# Patient Record
Sex: Female | Born: 1955 | Race: White | Hispanic: No | Smoking: Former smoker
Health system: Southern US, Community
[De-identification: ages and names within clinical notes are randomized; demographics above are authoritative.]

## PROBLEM LIST (undated history)

## (undated) DIAGNOSIS — Z5111 Encounter for antineoplastic chemotherapy: Secondary | ICD-10-CM

## (undated) DIAGNOSIS — R112 Nausea with vomiting, unspecified: Secondary | ICD-10-CM

## (undated) DIAGNOSIS — Z9889 Other specified postprocedural states: Secondary | ICD-10-CM

## (undated) DIAGNOSIS — M1990 Unspecified osteoarthritis, unspecified site: Secondary | ICD-10-CM

## (undated) DIAGNOSIS — K589 Irritable bowel syndrome without diarrhea: Secondary | ICD-10-CM

## (undated) DIAGNOSIS — F419 Anxiety disorder, unspecified: Secondary | ICD-10-CM

## (undated) DIAGNOSIS — R0600 Dyspnea, unspecified: Secondary | ICD-10-CM

## (undated) DIAGNOSIS — C801 Malignant (primary) neoplasm, unspecified: Secondary | ICD-10-CM

## (undated) DIAGNOSIS — J449 Chronic obstructive pulmonary disease, unspecified: Secondary | ICD-10-CM

## (undated) DIAGNOSIS — D131 Benign neoplasm of stomach: Secondary | ICD-10-CM

## (undated) DIAGNOSIS — T451X5A Adverse effect of antineoplastic and immunosuppressive drugs, initial encounter: Secondary | ICD-10-CM

## (undated) DIAGNOSIS — K635 Polyp of colon: Secondary | ICD-10-CM

## (undated) DIAGNOSIS — I10 Essential (primary) hypertension: Secondary | ICD-10-CM

## (undated) DIAGNOSIS — D6481 Anemia due to antineoplastic chemotherapy: Secondary | ICD-10-CM

## (undated) DIAGNOSIS — M797 Fibromyalgia: Secondary | ICD-10-CM

## (undated) DIAGNOSIS — E039 Hypothyroidism, unspecified: Secondary | ICD-10-CM

## (undated) DIAGNOSIS — K219 Gastro-esophageal reflux disease without esophagitis: Secondary | ICD-10-CM

## (undated) DIAGNOSIS — E860 Dehydration: Secondary | ICD-10-CM

## (undated) DIAGNOSIS — K5792 Diverticulitis of intestine, part unspecified, without perforation or abscess without bleeding: Secondary | ICD-10-CM

## (undated) HISTORY — PX: FOOT SURGERY: SHX648

## (undated) HISTORY — PX: TONSILLECTOMY: SUR1361

## (undated) HISTORY — DX: Diverticulitis of intestine, part unspecified, without perforation or abscess without bleeding: K57.92

## (undated) HISTORY — DX: Malignant (primary) neoplasm, unspecified: C80.1

## (undated) HISTORY — PX: UPPER GASTROINTESTINAL ENDOSCOPY: SHX188

## (undated) HISTORY — PX: BLADDER SUSPENSION: SHX72

## (undated) HISTORY — PX: BREAST IMPLANT REMOVAL: SHX5361

## (undated) HISTORY — DX: Polyp of colon: K63.5

## (undated) HISTORY — PX: COLONOSCOPY: SHX174

## (undated) HISTORY — DX: Anemia due to antineoplastic chemotherapy: D64.81

## (undated) HISTORY — DX: Benign neoplasm of stomach: D13.1

## (undated) HISTORY — DX: Dehydration: E86.0

## (undated) HISTORY — DX: Adverse effect of antineoplastic and immunosuppressive drugs, initial encounter: T45.1X5A

## (undated) HISTORY — PX: BREAST ENHANCEMENT SURGERY: SHX7

## (undated) HISTORY — DX: Encounter for antineoplastic chemotherapy: Z51.11

---

## 1997-12-02 HISTORY — PX: CHOLECYSTECTOMY: SHX55

## 2000-12-10 ENCOUNTER — Encounter: Admission: RE | Admit: 2000-12-10 | Discharge: 2000-12-10 | Payer: Self-pay | Admitting: Family Medicine

## 2000-12-10 ENCOUNTER — Encounter: Payer: Self-pay | Admitting: Family Medicine

## 2001-09-24 ENCOUNTER — Emergency Department (HOSPITAL_COMMUNITY): Admission: EM | Admit: 2001-09-24 | Discharge: 2001-09-24 | Payer: Self-pay | Admitting: Internal Medicine

## 2001-09-28 ENCOUNTER — Emergency Department (HOSPITAL_COMMUNITY): Admission: EM | Admit: 2001-09-28 | Discharge: 2001-09-28 | Payer: Self-pay | Admitting: Emergency Medicine

## 2001-10-01 ENCOUNTER — Emergency Department (HOSPITAL_COMMUNITY): Admission: EM | Admit: 2001-10-01 | Discharge: 2001-10-01 | Payer: Self-pay | Admitting: Emergency Medicine

## 2001-10-08 ENCOUNTER — Emergency Department (HOSPITAL_COMMUNITY): Admission: EM | Admit: 2001-10-08 | Discharge: 2001-10-08 | Payer: Self-pay | Admitting: Emergency Medicine

## 2001-10-22 ENCOUNTER — Emergency Department (HOSPITAL_COMMUNITY): Admission: EM | Admit: 2001-10-22 | Discharge: 2001-10-22 | Payer: Self-pay | Admitting: *Deleted

## 2001-12-15 ENCOUNTER — Emergency Department (HOSPITAL_COMMUNITY): Admission: EM | Admit: 2001-12-15 | Discharge: 2001-12-15 | Payer: Self-pay | Admitting: *Deleted

## 2001-12-15 ENCOUNTER — Encounter: Payer: Self-pay | Admitting: *Deleted

## 2002-01-04 ENCOUNTER — Other Ambulatory Visit: Admission: RE | Admit: 2002-01-04 | Discharge: 2002-01-04 | Payer: Self-pay | Admitting: Dermatology

## 2002-06-10 ENCOUNTER — Ambulatory Visit (HOSPITAL_COMMUNITY): Admission: RE | Admit: 2002-06-10 | Discharge: 2002-06-10 | Payer: Self-pay | Admitting: Unknown Physician Specialty

## 2002-06-10 ENCOUNTER — Encounter: Payer: Self-pay | Admitting: Unknown Physician Specialty

## 2002-08-04 ENCOUNTER — Ambulatory Visit (HOSPITAL_COMMUNITY): Admission: RE | Admit: 2002-08-04 | Discharge: 2002-08-04 | Payer: Self-pay | Admitting: Family Medicine

## 2002-08-04 ENCOUNTER — Encounter: Payer: Self-pay | Admitting: Family Medicine

## 2002-08-12 ENCOUNTER — Inpatient Hospital Stay (HOSPITAL_COMMUNITY): Admission: EM | Admit: 2002-08-12 | Discharge: 2002-08-14 | Payer: Self-pay | Admitting: Emergency Medicine

## 2002-08-12 ENCOUNTER — Encounter: Payer: Self-pay | Admitting: Emergency Medicine

## 2002-08-13 ENCOUNTER — Encounter: Payer: Self-pay | Admitting: *Deleted

## 2002-11-15 ENCOUNTER — Other Ambulatory Visit: Admission: RE | Admit: 2002-11-15 | Discharge: 2002-11-15 | Payer: Self-pay | Admitting: Dermatology

## 2003-06-03 ENCOUNTER — Ambulatory Visit (HOSPITAL_COMMUNITY): Admission: RE | Admit: 2003-06-03 | Discharge: 2003-06-03 | Payer: Self-pay | Admitting: Family Medicine

## 2003-06-03 ENCOUNTER — Encounter: Payer: Self-pay | Admitting: Family Medicine

## 2003-06-06 ENCOUNTER — Encounter: Payer: Self-pay | Admitting: *Deleted

## 2003-06-06 ENCOUNTER — Inpatient Hospital Stay (HOSPITAL_COMMUNITY): Admission: EM | Admit: 2003-06-06 | Discharge: 2003-06-09 | Payer: Self-pay | Admitting: *Deleted

## 2003-06-22 ENCOUNTER — Ambulatory Visit (HOSPITAL_COMMUNITY): Admission: RE | Admit: 2003-06-22 | Discharge: 2003-06-22 | Payer: Self-pay | Admitting: Family Medicine

## 2003-06-22 ENCOUNTER — Encounter: Payer: Self-pay | Admitting: Family Medicine

## 2003-07-04 ENCOUNTER — Ambulatory Visit (HOSPITAL_COMMUNITY): Admission: RE | Admit: 2003-07-04 | Discharge: 2003-07-04 | Payer: Self-pay | Admitting: Family Medicine

## 2003-07-04 ENCOUNTER — Encounter: Payer: Self-pay | Admitting: Family Medicine

## 2003-08-12 ENCOUNTER — Ambulatory Visit (HOSPITAL_COMMUNITY): Admission: RE | Admit: 2003-08-12 | Discharge: 2003-08-12 | Payer: Self-pay | Admitting: Pulmonary Disease

## 2003-08-31 ENCOUNTER — Ambulatory Visit (HOSPITAL_COMMUNITY): Admission: RE | Admit: 2003-08-31 | Discharge: 2003-08-31 | Payer: Self-pay | Admitting: *Deleted

## 2003-08-31 ENCOUNTER — Encounter: Payer: Self-pay | Admitting: *Deleted

## 2003-12-16 ENCOUNTER — Emergency Department (HOSPITAL_COMMUNITY): Admission: EM | Admit: 2003-12-16 | Discharge: 2003-12-16 | Payer: Self-pay | Admitting: Emergency Medicine

## 2004-01-02 ENCOUNTER — Ambulatory Visit (HOSPITAL_COMMUNITY): Admission: RE | Admit: 2004-01-02 | Discharge: 2004-01-02 | Payer: Self-pay | Admitting: Family Medicine

## 2004-01-06 ENCOUNTER — Ambulatory Visit (HOSPITAL_COMMUNITY): Admission: RE | Admit: 2004-01-06 | Discharge: 2004-01-06 | Payer: Self-pay | Admitting: Family Medicine

## 2004-01-18 ENCOUNTER — Ambulatory Visit (HOSPITAL_COMMUNITY): Admission: RE | Admit: 2004-01-18 | Discharge: 2004-01-18 | Payer: Self-pay | Admitting: Family Medicine

## 2004-01-26 ENCOUNTER — Emergency Department (HOSPITAL_COMMUNITY): Admission: EM | Admit: 2004-01-26 | Discharge: 2004-01-26 | Payer: Self-pay | Admitting: Emergency Medicine

## 2004-04-03 ENCOUNTER — Inpatient Hospital Stay (HOSPITAL_COMMUNITY): Admission: EM | Admit: 2004-04-03 | Discharge: 2004-04-10 | Payer: Self-pay | Admitting: Emergency Medicine

## 2004-04-11 ENCOUNTER — Observation Stay (HOSPITAL_COMMUNITY): Admission: EM | Admit: 2004-04-11 | Discharge: 2004-04-12 | Payer: Self-pay | Admitting: Emergency Medicine

## 2004-06-11 ENCOUNTER — Ambulatory Visit (HOSPITAL_COMMUNITY): Admission: RE | Admit: 2004-06-11 | Discharge: 2004-06-11 | Payer: Self-pay | Admitting: Gastroenterology

## 2004-06-22 ENCOUNTER — Emergency Department (HOSPITAL_COMMUNITY): Admission: EM | Admit: 2004-06-22 | Discharge: 2004-06-22 | Payer: Self-pay | Admitting: Emergency Medicine

## 2004-09-12 ENCOUNTER — Emergency Department (HOSPITAL_COMMUNITY): Admission: EM | Admit: 2004-09-12 | Discharge: 2004-09-12 | Payer: Self-pay | Admitting: Emergency Medicine

## 2004-09-21 ENCOUNTER — Emergency Department (HOSPITAL_COMMUNITY): Admission: EM | Admit: 2004-09-21 | Discharge: 2004-09-22 | Payer: Self-pay | Admitting: *Deleted

## 2004-12-01 ENCOUNTER — Emergency Department (HOSPITAL_COMMUNITY): Admission: EM | Admit: 2004-12-01 | Discharge: 2004-12-01 | Payer: Self-pay | Admitting: Emergency Medicine

## 2005-02-05 ENCOUNTER — Ambulatory Visit: Payer: Self-pay | Admitting: Internal Medicine

## 2005-05-23 ENCOUNTER — Ambulatory Visit: Payer: Self-pay | Admitting: Internal Medicine

## 2005-06-03 ENCOUNTER — Ambulatory Visit (HOSPITAL_COMMUNITY): Admission: RE | Admit: 2005-06-03 | Discharge: 2005-06-03 | Payer: Self-pay | Admitting: Internal Medicine

## 2005-06-05 ENCOUNTER — Emergency Department (HOSPITAL_COMMUNITY): Admission: EM | Admit: 2005-06-05 | Discharge: 2005-06-05 | Payer: Self-pay | Admitting: Emergency Medicine

## 2005-07-03 ENCOUNTER — Ambulatory Visit: Payer: Self-pay | Admitting: Internal Medicine

## 2005-07-04 ENCOUNTER — Ambulatory Visit (HOSPITAL_COMMUNITY): Admission: RE | Admit: 2005-07-04 | Discharge: 2005-07-04 | Payer: Self-pay | Admitting: Internal Medicine

## 2005-08-28 ENCOUNTER — Ambulatory Visit (HOSPITAL_COMMUNITY): Admission: RE | Admit: 2005-08-28 | Discharge: 2005-08-28 | Payer: Self-pay | Admitting: Unknown Physician Specialty

## 2006-03-18 ENCOUNTER — Ambulatory Visit: Payer: Self-pay | Admitting: Internal Medicine

## 2006-06-02 ENCOUNTER — Ambulatory Visit (HOSPITAL_COMMUNITY): Admission: RE | Admit: 2006-06-02 | Discharge: 2006-06-02 | Payer: Self-pay | Admitting: Gastroenterology

## 2006-07-04 ENCOUNTER — Inpatient Hospital Stay (HOSPITAL_COMMUNITY): Admission: AD | Admit: 2006-07-04 | Discharge: 2006-07-06 | Payer: Self-pay | Admitting: Internal Medicine

## 2006-07-04 ENCOUNTER — Ambulatory Visit: Payer: Self-pay | Admitting: Urgent Care

## 2006-07-10 ENCOUNTER — Ambulatory Visit (HOSPITAL_COMMUNITY): Admission: RE | Admit: 2006-07-10 | Discharge: 2006-07-10 | Payer: Self-pay | Admitting: Family Medicine

## 2006-08-06 ENCOUNTER — Ambulatory Visit: Payer: Self-pay | Admitting: Internal Medicine

## 2006-09-01 ENCOUNTER — Ambulatory Visit (HOSPITAL_COMMUNITY): Admission: RE | Admit: 2006-09-01 | Discharge: 2006-09-01 | Payer: Self-pay | Admitting: Internal Medicine

## 2006-09-24 ENCOUNTER — Ambulatory Visit (HOSPITAL_COMMUNITY): Admission: RE | Admit: 2006-09-24 | Discharge: 2006-09-24 | Payer: Self-pay | Admitting: Family Medicine

## 2006-09-30 ENCOUNTER — Ambulatory Visit (HOSPITAL_COMMUNITY): Admission: RE | Admit: 2006-09-30 | Discharge: 2006-09-30 | Payer: Self-pay | Admitting: Internal Medicine

## 2007-04-01 ENCOUNTER — Ambulatory Visit (HOSPITAL_COMMUNITY): Admission: RE | Admit: 2007-04-01 | Discharge: 2007-04-01 | Payer: Self-pay | Admitting: Unknown Physician Specialty

## 2007-04-16 ENCOUNTER — Ambulatory Visit (HOSPITAL_COMMUNITY): Payer: Self-pay | Admitting: Psychology

## 2007-04-17 ENCOUNTER — Ambulatory Visit (HOSPITAL_COMMUNITY): Payer: Self-pay | Admitting: Psychiatry

## 2007-04-22 ENCOUNTER — Ambulatory Visit (HOSPITAL_COMMUNITY): Payer: Self-pay | Admitting: Psychology

## 2007-05-11 ENCOUNTER — Ambulatory Visit (HOSPITAL_COMMUNITY): Payer: Self-pay | Admitting: Psychology

## 2007-06-04 ENCOUNTER — Ambulatory Visit (HOSPITAL_COMMUNITY): Admission: RE | Admit: 2007-06-04 | Discharge: 2007-06-04 | Payer: Self-pay | Admitting: Internal Medicine

## 2007-10-13 ENCOUNTER — Encounter: Admission: RE | Admit: 2007-10-13 | Discharge: 2007-10-13 | Payer: Self-pay | Admitting: Unknown Physician Specialty

## 2007-12-08 ENCOUNTER — Inpatient Hospital Stay (HOSPITAL_COMMUNITY): Admission: EM | Admit: 2007-12-08 | Discharge: 2007-12-11 | Payer: Self-pay | Admitting: Emergency Medicine

## 2008-02-28 ENCOUNTER — Emergency Department (HOSPITAL_COMMUNITY): Admission: EM | Admit: 2008-02-28 | Discharge: 2008-02-28 | Payer: Self-pay | Admitting: Emergency Medicine

## 2008-04-13 ENCOUNTER — Ambulatory Visit (HOSPITAL_COMMUNITY): Admission: RE | Admit: 2008-04-13 | Discharge: 2008-04-13 | Payer: Self-pay | Admitting: Internal Medicine

## 2008-04-20 ENCOUNTER — Ambulatory Visit (HOSPITAL_COMMUNITY): Admission: RE | Admit: 2008-04-20 | Discharge: 2008-04-20 | Payer: Self-pay | Admitting: Family Medicine

## 2008-05-25 ENCOUNTER — Emergency Department (HOSPITAL_COMMUNITY): Admission: EM | Admit: 2008-05-25 | Discharge: 2008-05-25 | Payer: Self-pay | Admitting: Emergency Medicine

## 2008-10-17 ENCOUNTER — Ambulatory Visit (HOSPITAL_COMMUNITY): Admission: RE | Admit: 2008-10-17 | Discharge: 2008-10-17 | Payer: Self-pay | Admitting: Internal Medicine

## 2009-07-25 ENCOUNTER — Ambulatory Visit (HOSPITAL_COMMUNITY): Admission: RE | Admit: 2009-07-25 | Discharge: 2009-07-25 | Payer: Self-pay | Admitting: Internal Medicine

## 2009-08-16 ENCOUNTER — Emergency Department (HOSPITAL_COMMUNITY): Admission: EM | Admit: 2009-08-16 | Discharge: 2009-08-16 | Payer: Self-pay | Admitting: Emergency Medicine

## 2010-07-19 ENCOUNTER — Emergency Department (HOSPITAL_COMMUNITY): Admission: EM | Admit: 2010-07-19 | Discharge: 2010-07-19 | Payer: Self-pay | Admitting: Emergency Medicine

## 2010-07-19 ENCOUNTER — Inpatient Hospital Stay (HOSPITAL_COMMUNITY): Admission: EM | Admit: 2010-07-19 | Discharge: 2010-07-20 | Payer: Self-pay | Admitting: Emergency Medicine

## 2010-07-28 ENCOUNTER — Ambulatory Visit (HOSPITAL_COMMUNITY): Admission: RE | Admit: 2010-07-28 | Discharge: 2010-07-28 | Payer: Self-pay | Admitting: Family Medicine

## 2010-11-12 ENCOUNTER — Encounter (HOSPITAL_COMMUNITY): Admission: RE | Admit: 2010-11-12 | Payer: Self-pay | Source: Home / Self Care | Admitting: Orthopedic Surgery

## 2010-11-15 ENCOUNTER — Ambulatory Visit (HOSPITAL_COMMUNITY)
Admission: RE | Admit: 2010-11-15 | Discharge: 2010-11-15 | Payer: Self-pay | Source: Home / Self Care | Attending: Orthopedic Surgery | Admitting: Orthopedic Surgery

## 2010-12-23 ENCOUNTER — Encounter: Payer: Self-pay | Admitting: Obstetrics and Gynecology

## 2010-12-23 ENCOUNTER — Encounter: Payer: Self-pay | Admitting: *Deleted

## 2011-01-08 ENCOUNTER — Other Ambulatory Visit: Payer: Self-pay | Admitting: Neurosurgery

## 2011-01-08 DIAGNOSIS — M47812 Spondylosis without myelopathy or radiculopathy, cervical region: Secondary | ICD-10-CM

## 2011-01-10 ENCOUNTER — Ambulatory Visit
Admission: RE | Admit: 2011-01-10 | Discharge: 2011-01-10 | Disposition: A | Discharge status: Home / Self Care | Payer: MEDICARE | Source: Ambulatory Visit | Attending: Neurosurgery | Admitting: Neurosurgery

## 2011-01-10 DIAGNOSIS — M47812 Spondylosis without myelopathy or radiculopathy, cervical region: Secondary | ICD-10-CM

## 2011-02-14 LAB — DIFFERENTIAL
Basophils Relative: 1 % (ref 0–1)
Eosinophils Absolute: 0.3 10*3/uL (ref 0.0–0.7)
Eosinophils Relative: 4 % (ref 0–5)
Lymphs Abs: 2.4 10*3/uL (ref 0.7–4.0)
Monocytes Relative: 9 % (ref 3–12)
Neutrophils Relative %: 51 % (ref 43–77)

## 2011-02-14 LAB — BASIC METABOLIC PANEL
BUN: 13 mg/dL (ref 6–23)
Creatinine, Ser: 0.7 mg/dL (ref 0.4–1.2)
GFR calc Af Amer: >60 mL/min (ref >60)
GFR calc non Af Amer: >60 mL/min (ref >60)
Potassium: 3.6 mEq/L (ref 3.5–5.1)

## 2011-02-14 LAB — CARDIAC PANEL(CRET KIN+CKTOT+MB+TROPI)
CK, MB: 0.5 ng/mL (ref 0.3–4.0)
CK, MB: 0.6 ng/mL (ref 0.3–4.0)
Relative Index: INVALID (ref 0.0–2.5)
Relative Index: INVALID (ref 0.0–2.5)
Total CK: 68 U/L (ref 7–177)
Troponin I: 0.01 ng/mL (ref 0.00–0.06)
Troponin I: 0.02 ng/mL (ref 0.00–0.06)

## 2011-02-14 LAB — CBC
HCT: 39.2 % (ref 36.0–46.0)
MCH: 29.4 pg (ref 26.0–34.0)
MCHC: 34.4 g/dL (ref 30.0–36.0)
RBC: 4.59 MIL/uL (ref 3.87–5.11)
WBC: 7.1 10*3/uL (ref 4.0–10.5)

## 2011-02-14 LAB — POCT CARDIAC MARKERS
CKMB, poc: <1 ng/mL — ABNORMAL LOW (ref 1.0–8.0)
Myoglobin, poc: 30.5 ng/mL (ref 12–200)
Myoglobin, poc: 32.3 ng/mL (ref 12–200)
Troponin i, poc: <0.05 ng/mL (ref 0.00–0.09)

## 2011-03-08 LAB — BASIC METABOLIC PANEL
BUN: 12 mg/dL (ref 6–23)
Calcium: 9.1 mg/dL (ref 8.4–10.5)
Chloride: 102 mEq/L (ref 96–112)
Creatinine, Ser: 0.69 mg/dL (ref 0.4–1.2)
GFR calc Af Amer: >60 mL/min (ref >60)
GFR calc non Af Amer: >60 mL/min (ref >60)

## 2011-03-08 LAB — POCT CARDIAC MARKERS
CKMB, poc: <1 ng/mL — ABNORMAL LOW (ref 1.0–8.0)
Myoglobin, poc: 41.2 ng/mL (ref 12–200)
Troponin i, poc: <0.05 ng/mL (ref 0.00–0.09)

## 2011-03-08 LAB — DIFFERENTIAL
Eosinophils Absolute: 0.2 10*3/uL (ref 0.0–0.7)
Lymphs Abs: 3.1 10*3/uL (ref 0.7–4.0)
Neutro Abs: 6.5 10*3/uL (ref 1.7–7.7)
Neutrophils Relative %: 61 % (ref 43–77)

## 2011-03-08 LAB — HEPATIC FUNCTION PANEL
AST: 21 U/L (ref 0–37)
Albumin: 3.4 g/dL — ABNORMAL LOW (ref 3.5–5.2)
Alkaline Phosphatase: 67 U/L (ref 39–117)
Total Protein: 6.7 g/dL (ref 6.0–8.3)

## 2011-03-08 LAB — MAGNESIUM: Magnesium: 1.8 mg/dL (ref 1.5–2.5)

## 2011-03-08 LAB — CBC
MCV: 90.4 fL (ref 78.0–100.0)
Platelets: 261 10*3/uL (ref 150–400)
RBC: 4.76 MIL/uL (ref 3.87–5.11)
WBC: 10.6 10*3/uL — ABNORMAL HIGH (ref 4.0–10.5)

## 2011-03-08 LAB — D-DIMER, QUANTITATIVE: D-Dimer, Quant: 0.34 ug/mL-FEU (ref 0.00–0.48)

## 2011-04-16 NOTE — Discharge Summary (Signed)
NAMETERRACE, CHIEM                  ACCOUNT NO.:  [id number]   MEDICAL RECORD NO.:  [id number]          PATIENT TYPE:  INP   LOCATION:  2030                         FACILITY:  [facility]   PHYSICIAN:  Dani Gobble, MD       DATE OF BIRTH:  [birth year]   DATE OF ADMISSION:  12/08/2007  DATE OF DISCHARGE:  12/11/2007                               DISCHARGE SUMMARY   DISCHARGE DIAGNOSIS:  1. Chest pain, myocardial infarction ruled out this admission.  2. Mild coronary disease at catheterization in 2003.  3. Treated hypertension.  4  Irritable bowel syndrome.  1. Headache and mild sinusitis.  2. Morbid obesity.   HOSPITAL COURSE:  The patient is a 55 year old female followed by Dr.  Sherwood Gambler and seen in the past by Dr. Domingo Sep.  She was admitted from her  primary care's office with multiple somatic complaints including chest  pain.  She does have positive risk factors and had mild coronary disease  in the past with a 40% LAD in September 2003.  The patient was admitted  to telemetry.  MI was ruled out and her D-dimer was normal.  We were  going to discharge her on the 8th but she complained of weakness and  diarrhea.  Stool samples and H. pylori were obtained.  Her C Dif  is  negative, Giardia negative and H. pylori negative.  She is improved on  the 9th and we feel she can be discharged.  She had complained of  headache this admission and we did do an MRI.  This was essentially  unremarkable showing some mild mucosal edema in her sinuses.   DISCHARGE MEDICATIONS:  1. Maxzide once a day.  2. Protonix 40 mg a day.  3. Toprol 25 mg a day.  4. Premarin 1.25 mg a day.  5. ProSom 2 mg h.s. p.r.n.  6. Zyrtec 10 mg a day.  7. Ultram 50 mg one to two q. six p.r.n. for headache.   LABORATORY DATA:  Sodium 139, potassium 3.3, BUN 4, creatinine 0.5,  white count 9.3, hemoglobin 14.9, hematocrit 43.3, platelets 355.  Liver  functions were normal.  CK-MB and troponins were negative.  LDL was 32,  HDL 63, cholesterol 153, magnesium 1.7. Giardia, H. pylori , and C Dif  were all negative.  TSH 1.15.  D-dimer is 0.3. MRI of her brain showed  focal right thalamic diffusion of unclear significance, mild thickening  of the mucosa in the ethmoids. Chest x-ray no active disease.  UA is  negative.  INR is 1.   EKG shows sinus rhythm without acute changes, she does have some  nonspecific ST changes.   DISPOSITION:  The patient is discharged in stable condition and will  follow-up with Dr. Sherwood Gambler.      Abelino Derrick, P.A.    ______________________________  Dani Gobble, MD    LKK/MEDQ  D:  12/11/2007  T:  12/11/2007  Job:  [id number]   cc:   Madelin Rear. Sherwood Gambler, MD

## 2011-04-19 NOTE — H&P (Signed)
NAME:  Rebekah Henderson, Rebekah Henderson                            ACCOUNT NO.:  [id number]   MEDICAL RECORD NO.:  [id number]                   PATIENT TYPE:  INP   LOCATION:  1829                                 FACILITY:  [facility]   PHYSICIAN:  Corinna L. Lendell Caprice, MD             DATE OF BIRTH:  1956-11-22   DATE OF ADMISSION:  04/10/2004  DATE OF DISCHARGE:                                HISTORY & PHYSICAL   CHIEF COMPLAINT:  Shortness of breath and mouth pain.   HISTORY OF PRESENT ILLNESS:  Rebekah Henderson is a 55 year old white female who was  just discharged from [facility] this morning with pneumonia and  COPD exacerbation. She had been in the hospital for a week and had really  not improved.  Apparently they sent her home and at home she became more  short of breath.  She is so weak that she cannot walk without assistance.  She has had a few episodes of vomiting and diarrhea.  She has been unable to  eat and her mouth is hurting very badly.  She still with the cough  productive of white sputum.  She has had fevers and chills.   There is no discharge summary available yet, but a CAT scan showed no  pulmonary embolus, but a small pericardial effusion and diffuse bronchitis  with patchy bronchopneumonia throughout both lungs, hiatal hernia and fatty  liver.  The patient was discharged home on Levaquin 750 mg and a prednisone  taper.  She also has a nebulizer machine at home.   PAST MEDICAL HISTORY:  1. COPD with continued tobacco abuse.  2. Hypertension of pneumonias.  3. Reported coronary artery disease according to H&P from [facility].   MEDICATIONS:  1. Levaquin 750 mg p.o. daily.  2. Prednisone taper; she was to take 40 mg today.  3. Bentyl as needed.  4. FiberCon .  5. Nexium 40 mg a day.  6. Toprol XL 25 mg a day.  7. Maxzide 37.5/5 mg a day.  8. Spiriva daily.  9. ProSom 2 mg q.h.s. p.r.n. sleep.   ALLERGIES:  The patient is allergic to PENICILLIN, which causes swelling,  ZANTAC,  which causes choking, and CODEINE, which causes vomiting.   SOCIAL HISTORY:  The patient continues to smoke.  She lives with her son.   FAMILY HISTORY:  Family history is noncontributory.   REVIEW OF SYSTEMS:  IN GENERAL:  Fevers and chills as above.  HEENT:  As  above.  RESPIRATORY: As above.  CARDIOVASCULAR: No chest pains or  palpitations.  GASTROINTESTINAL: As above.  GENITOURINARY: No dysuria or  hematuria.  MUSCULOSKELETAL:  The patient is complaining of myalgias in her  legs.  ENDOCRINE:  No diabetes.  HEMATOLOGIC:  No history of DVT or  thromboembolism.  PSYCHIATRIC:  No depression.  NEUROLOGIC:  No seizures.   PHYSICAL EXAMINATION:  VITAL SIGNS:  On physical examination  her oxygen  saturations when EMS arrived were only about 88%on room air.  Currently her  oxygen saturation in the 90 percentile range on 4 liters of oxygen.  Respiratory rate 20, pulse 86, temperature 97.9 and blood pressure 129/73.  GENERAL APPEARANCE:  Internal - the patient is an uncomfortable-appearing  white female who is able to speak in complete sentences.  HEENT:  Normocephalic and atraumatic.  Pupils equal, round and react to  light.  Tympanic membranes are clear.  She has a white plaque on her tongue  and oral mucous membranes.  No sinus tenderness.  NECK:  Neck is supple no lymphadenopathy.  LUNGS:  Lungs are with expiratory rhonchi and wheeze.  No rales.  CARDIOVASCULAR:  Regular rate and rhythm without murmurs, gallops or rubs.  ABDOMEN: Normal bowel sounds.  Soft, nontender and nondistended.  GENITALIA AND RECTAL:  GU and rectal are deferred.  EXTREMITIES:  No clubbing, cyanosis or edema.  No calf tenderness.  SKIN:  No rash.  PSYCHIATRIC:  Normal affect.  NEUROLOGIC:  Alert and oriented.  Cranial nerves, sensory and motor exams  are intact.   LABORATORY DATA:  The patient's ABGs on 3 liters of oxygen revealed a pH of  9.497, pCO2 43, pO2 64, and bicarbonate 33.  White count is 13.5, but she  is  on steroids, hemoglobin 12.6, hematocrit 37.1, and platelet count 385,000.  She has 78% neutrophils and greater than 20% bands.  D-dimer is 0.6.  Basic  metabolic panel is essentially normal but her BUN is 22 with a creatinine of  0.8.  CPK MB and troponin are unremarkable.  UA negative.  PA and lateral  chest x-ray shows  bibasilar atelectasis versus infiltrates.   ASSESSMENT AND PLAN:  1. Bilateral pneumonia.  The patient was just discharged from [facility] this morning, but  her oxygen saturations initially were only 88%.  She is still wheezing.  She  is extremely weak and not eating well.   I will admit patient and broaden her antibiotic spectrum.  She is allergic  to PENICILLIN.  I will give cefepime and vancomycin.   1. Chronic obstructive pulmonary disease  exacerbation.   I will give oxygen, hand held nebulizer and continue her Spiriva.   1. Hypertension.   Hold her Maxzide for now as she appears dehydrated.  I will resume her  Toprol, however,.   1. Nausea, vomiting and poor intake.   The patient will get  intravenous fluids and antiemetics.   1. Diarrhea.   I will check a Clostridium difficile.   1. Weakness secondary to above.  2. Thrush.   The patient will get a single dose of Diflucan intravenous here in the  emergency room and then switch to nystatin swish and swallow.   1. Tobacco abuse.   Counselled against.                                                Corinna L. Lendell Caprice, MD    CLS/MEDQ  D:  04/11/2004  T:  04/11/2004  Job:  [id number]

## 2011-04-19 NOTE — Discharge Summary (Signed)
NAME:  Rebekah Henderson, Rebekah Henderson                            ACCOUNT NO.:  [id number]   MEDICAL RECORD NO.:  [id number]                   PATIENT TYPE:  INP   LOCATION:  A209                                 FACILITY:  [facility]   PHYSICIAN:  Madelin Rear. Sherwood Gambler, M.D.             DATE OF BIRTH:  December 17, 1955   DATE OF ADMISSION:  04/03/2004  DATE OF DISCHARGE:  04/10/2004                                 DISCHARGE SUMMARY   DISCHARGE MEDICATIONS:  1. Levaquin 750 mg p.o. daily.  2. Albuterol nebulizers q.i.d.  3. Spiriva daily.  4. Prednisone tapered over one week, 40 mg.  5. Advair b.i.d.   DISCHARGE DIAGNOSES:  1. Chronic obstructive pulmonary disease.  2. Hypertension.  3. Coronary artery disease.  4. Tobacco abuse.   HOSPITAL COURSE:  The patient was admitted with shortness of breath,  respiratory insufficiency.  She was admitted with severe wheezing and  tachypnea secondary to bronchitis.  She was treated with antibiotics,  bronchodilator, and steroid therapy, and improved gradually.  She was  subsequently discharged to follow up in my office in one week.     ___________________________________________                                         Madelin Rear. Sherwood Gambler, M.D.   LJF/MEDQ  D:  04/15/2004  T:  04/15/2004  Job:  [id number]

## 2011-04-19 NOTE — Consult Note (Signed)
NAMEANASTYN, Rebekah Henderson                  ACCOUNT NO.:  [id number]   MEDICAL RECORD NO.:  [id number]          PATIENT TYPE:  INP   LOCATION:  A318                          FACILITY:  [facility]   PHYSICIAN:  Lionel December, M.D.    DATE OF BIRTH:  29-Sep-1956   DATE OF CONSULTATION:  DATE OF DISCHARGE:                                   CONSULTATION   REQUESTING PHYSICIAN:  Corrie Mckusick, M.D.   REASON FOR CONSULTATION:  Abdominal pain, nausea, vomiting, diarrhea.   HISTORY OF PRESENT ILLNESS:  Rebekah Henderson is a 55 year old Caucasian female who  is well known to [place] with a history of chronic nausea, vomiting, diarrhea,  IBS, and GERD.  She states over the last week, she has had worsening left  lower quadrant abdominal pain which was cramp-like in nature.  It has been  pretty much constant, 7/10 on pain scale, with loose-watery to semi-formed  stools.  She denies any rectal bleeding or melena.  Denies any fever or  chills.  She has had nausea and vomiting every time she eats.  She has often  noticed multiple stools a day.  She denies any anorexia, heartburn, or  indigestion, dysphagia or odynophagia, but she does complain of sore throat.  She has had an extensive evaluation by Dr. __________ at [facility] within the last year.  Her weight has remained stable.  She was  recently tried on Librax which has helped some.  She has tried Lomotil with  some help as well.  She had a CT of the abdomen and pelvis with contrast  which showed intra- and extrahepatic ductal diltation which was stable and  chronic.  She was found to have mild constipation and sigmoid  diverticulosis.  She denies any ill contacts.  She was seen by Dr. Phillips Odor  and placed on Cipro and Flagyl, empirically treated for diverticulitis last  Friday.  She had a colonoscopy at [facility] within the last year which showed  diverticulosis, per her report.   PAST MEDICAL/SURGICAL HISTORY:  Colonoscopy, April 2005, by Dr.  Karilyn Cota,  which showed pancolonic diverticula.  She had an EGD which showed a small  hiatal hernia.  She was dilated with a 17-mm balloon due to esophageal  stricture.  She has a history of IBS, chronic intra- and extrahepatic ductal  dilatation, chronic nausea, vomiting, and diarrhea.  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  She has a  history of GERD, hypertension, fibromyalgia, complete hysterectomy,  tonsillectomy, right benign breast cyst removed, and cholecystectomy in  1997.   MEDICATIONS PRIOR TO ADMISSION:  1.  Premarin 1.25 mg daily.  2.  Maxzide 35.5/25 mg daily.  3.  Toprol XL 25 mg daily.  4.  Lyrica 50 mg t.i.d.  5.  ProSom 2 mg q.h.s.  6.  Klonopin 0.5 mg p.r.n.  7.  Flagyl 500 mg t.i.d.  8.  Cipro 500 mg b.i.d.  9.  Phenergan 25 mg p.r.n.  10. Protonix 40 mg daily.  11. Librax 5/2.5 mg  daily.   ALLERGIES:  PENICILLIN, CODEINE, and ZANTAC.   FAMILY HISTORY:  There is no family history of colorectal carcinoma, liver  or chronic GI problems.  No history of inflammatory bowel disease.  Mother  deceased in her 12s secondary to breast carcinoma.  Father deceased in his  31s secondary to lung carcinoma.  He was a smoker.  She has multiple  siblings, all of whom are healthy except for hypertension.   SOCIAL HISTORY:  Rebekah Henderson is divorced.  She lives with her grown healthy  son.  She is employed with [employer].  She has a 40-pack-year history  of tobacco use, quitting three years ago.  Denies alcohol or drug use.   REVIEW OF SYSTEMS:  CONSTITUTIONAL:  See HPI.  Weight has remained stable.  Denies any fever or chills.  CARDIOVASCULAR:  Denies any chest pain or  palpitations.  RESPIRATORY:  No shortness of breath, dyspnea, cough, or  hemoptysis.  GI:  See HPI.  HEENT:  She does complain of sore throat.   PHYSICAL EXAMINATION:  VITAL SIGNS:  Weight 85.1 kg, height 63 inches, temp  97.1, pulse 84, respirations 22, blood  pressure 136/79.  GENERAL:  Rebekah Henderson is a 55 year old well-developed, well-nourished Caucasian  female in no acute distress.  HEENT:  Sclerae clear, nonicteric.  Conjunctivae are pink.  Oropharynx pink  and moist without any lesions.  NECK:  Supple without __________ thyromegaly.  HEART:  Regular rate and rhythm.  Normal S1, S2 without any murmurs, rubs,  or gallops.  LUNGS:  Expiratory wheezes throughout.  No acute distress.  ABDOMEN:  Positive bowel sounds times four.  No bruits auscultated.  Soft,  nontender, nondistended without palpable mass or hepatosplenomegaly.  No  rebound tenderness or guarding.  Exam is limited given patient's body  habitus.   LABORATORY STUDIES:  WBC is 9, hemoglobin 14.3, hematocrit 41.2, platelets  322, calcium 9, sodium 139, potassium 3.5, chloride 100, CO2 of 29, BUN 12,  creatinine 0.8, glucose 91, total bilirubin 0.4, alkaline phosphatase is 54,  AST 17, ALT 12, total protein 6.5, and albumin 3.3 which is low.   IMPRESSION:  Rebekah Henderson is a 55 year old Caucasian female with a history of  chronic irritable bowel syndrome and gastroesophageal reflux disease with  intermittent chronic nausea, vomiting, and diarrhea.  She has had extensive  workup here and at [facility] more  recently.  She responded to her Librax; however, she has been alternating  this and does not want to take it with her Protonix.  CT of the abdomen and  pelvis with contrast shows stable chronic intra- and extrahepatic ductal  dilatation, mild constipation with sigmoid diverticula.  It is reassuring  that she has had a colonoscopy at [facility] within the last year as well as  EGD.  I suspect she either has an acute illness, viral versus infection,  with a history of underlying irritable bowel syndrome.   PLAN:  1.  Follow up on stool studies.  2.  Add fiber once daily.  3.  Resume Librax, one p.o. b.i.d.  4.  Will continue to follow.  We would like  to thank Dr. Phillips Odor for allowing [place] to participate in the  care of Rebekah Henderson.      Nicholas Lose, N.P.      Lionel December, M.D.  Electronically Signed    KC/MEDQ  D:  07/04/2006  T:  07/05/2006  Job:  [id number]   cc:  Halford Chessman, M.D.  Fax: [phone]

## 2011-04-19 NOTE — Consult Note (Signed)
NAME:  Rebekah Henderson, Rebekah Henderson                            ACCOUNT NO.:  [id number]   MEDICAL RECORD NO.:  [id number]                   PATIENT TYPE:  INP   LOCATION:  3710                                 FACILITY:  [facility]   PHYSICIAN:  Griffith Citron, M.D.             DATE OF BIRTH:  09-23-1956   DATE OF CONSULTATION:  DATE OF DISCHARGE:  08/14/2002                                   CONSULTATION   PAST MEDICAL HISTORY:  1. Hypertension.  2. Chronic tobacco abuse.  3. Depression/anxiety disorder.  4. Diverticular disease.  5. Status post hysterectomy.  6. Status post cholecystectomy, abdominal pain, calculus.  7. Fibrocystic breast disease.  8. Breast augmentation.  9. Bladder repair.   MEDICATIONS:  1. Nitroglycerin p.r.n.  2. Procardia 30 mg q.d.  3. Maxzide q.d.  4. Prozac 20 mg q.d.  5. Premarin 1.25 mg q.d.  6. ProSom 2 mg q.h.s.  7. Calcium with vitamin D supplements q.o.d.  8. Xanax 0.5 mg p.r.n.  9. Bentyl p.r.n.   ALLERGIES:  PENICILLIN, laryngeal edema.  CODEINE, nausea and vomiting.  ZANTAC, rash.   SOCIAL HISTORY:  The patient lives with her one and only child, son age 53.  Smokes two packs per day for the past 20 years.  Occasional alcohol.  Works  third shift for [employer].   REVIEW OF SYSTEMS:  Chronic mild shortness of breath.  Nonproductive cough.  Cramping of the legs with weakness brought about by walking.  Increased  dyspnea on exertion.  Occasional PND.  Otherwise, review of systems is  noncontributory.   FAMILY HISTORY:  Both parents deceased, mother from breast  cancer and  father from lung cancer.  Three brothers and one sister all alive and well.  One son age 6 and healthy.   PHYSICAL EXAMINATION:  GENERAL:  A healthy-appearing white female, alert and  oriented.  Appears older than her stated age.  Full affect.  Normal mood.  VITAL SIGNS:  Stable.  Heart rate with regular rhythm 64.  No gallop.  HEENT:  Anicteric sclerae.  Pink  conjunctivae.  No pallor.  Mouth without  oropharyngeal lesion.  NECK:  Supple.  No adenopathy, thyromegaly, or bruit.  CHEST:  Clear to auscultation with decreased breath sounds throughout.  CARDIOVASCULAR:  Regular rhythm.  No gallop and no murmur.  ABDOMEN:  Soft and nontender.  Nondistended.  No palpable organomegaly,  mass, or firmness.  Bowel sounds are active throughout.  No borborygmi,  bruit, or splash.  No venous prominence.  A small tattoo on the left lower  quadrant.  BREASTS:  Bilaterally symmetrical, status post breast augmentation.  RECTAL:  Not performed.  EXTREMITIES:  Without clubbing, cyanosis, or edema.  NEUROLOGICAL:  Grossly intact without focal deficit.   LABORATORY DATA:  Laboratory reviewed and essentially normal.  CBC, CMET,  amylase, and lipase were all within normal limits except for  minimally  decreased serum albumin of 3.2.  Coagulation studies including PT, PTT are  normal.  TSH, lipid profile are normal as amylase of 81 and lipase of 19.   ASSESSMENT:  Atypical chest pain, doubt gastrointestinal etiology of chest  pain.  The patient denies pyrosis or other symptoms of reflux disease.  No  dyspepsia to suggest acid-related etiology.  Risk factors include smoking  and aspirin use, though aspirin is relatively recent and smoking has been  longstanding.  There is no family predilection for peptic disease.  Suspect  possible functional component perhaps exacerbated by chronic tobacco abuse.  The patient's excellent appetite and weight gain further suggest a well  functioning GI track.  Nevertheless, because of the risk factors, severity  of her complaints, endoscopy is warranted to rule out upper gastrointestinal  tract lesion.   RECOMMENDATIONS:  1. Agree with Protonix 40 mg p.o. q.d.  2. Panendoscopy.  No prior endoscopy.                                               Griffith Citron, M.D.    Shawna Orleans  D:  08/13/2002  T:  08/16/2002  Job:   [id number]

## 2011-04-19 NOTE — Discharge Summary (Signed)
NAME:  Rebekah Henderson, Rebekah Henderson                            ACCOUNT NO.:  [id number]   MEDICAL RECORD NO.:  [id number]                   PATIENT TYPE:  INP   LOCATION:  5524                                 FACILITY:  [facility]   PHYSICIAN:  Jackie Plum, M.D.             DATE OF BIRTH:  09/19/1956   DATE OF ADMISSION:  04/11/2004  DATE OF DISCHARGE:  04/12/2004                                 DISCHARGE SUMMARY   DISCHARGE DIAGNOSES:  1. Presumptive community-acquired pneumonia.  2. Anxiety disorder.  3. Chronic obstructive pulmonary disease.  4. Cigarette smoking.  5. Hypertension.  6. Questionable history of coronary artery disease.   DISCHARGE MEDICATIONS:  The patient is to resume her prednisone taper as  previously and continue her preadmission medications.  She has been started  on a nicotine patch one 21 mg/hr patch and Avelox 400 mg daily.   DISCHARGE LABORATORIES:  WBC count 13.5, hemoglobin 12.6, hematocrit 37.1,  MCV 8.5, platelet count 385,000.  Sodium 135, potassium 3.8, chloride 100,  glucose 106, BUN 22, creatinine 0.8.  BNP 37.6.   ACTIVITY:  Activity as tolerated.   DIET:  Diet will be a low-salt diet.   SPECIAL INSTRUCTIONS:  The patient has been instructed to stop smoking  cigarettes.  She is to report to M.D. if she experiences any problems  including fever, chills or shortness of breath.   CONSULTS:  Not applicable.   PROCEDURES:  Not applicable.   REASON FOR HOSPITALIZATION:  Presumptive pneumonia.   The patient was admitted by Dr. __________  yesterday on account of dyspnea.  She was apparently discharged from [facility] for pneumonia and  COPD exacerbation.  She came back to the hospital at [facility] on account of  continued weakness with difficulty ambulating. She also had had episodes of  vomiting and diarrhea and she had not been able to eat and her mouth was  said to be hurting.  She has also had some fever and chills.   Admitting physical was  notable for O2 saturation of 88% on room air.  On  general examination, she was said to be uncomfortable-looking.  Her lung  exam noted for wheezes and rhonchi without any rales.  Her extremities did  not show any edema. Cardiac exam was notable for a regular rate and rhythm  without any gallops or murmur.  ABG on 2 L revealed a pH of 7.497 with a  PCO2 of 43 and PO2 of 64 and her white count was 13.5.  Chest x-ray was  reported this as bibasilar atelectasis versus infiltrates.  She was  therefore admitted for presumptive basilar pneumonia, based on clinical  findings.   HOSPITAL COURSE:  The patient was admitted to the hospitalist service.  IV  antibiotics were initiated.  She also received some scheduled bronchodilator  nebulizations with IV steroids.  The patient's illness has improved  remarkably this morning and  she is deemed appropriate for discharge today.  On rounds this morning, Rebekah Henderson does not have any fever, chills or  shortness of breath; she feels stronger now and she would like to go home  for continued outpatient care.  On general examination, she looks very  anxious but not in distress, cardiopulmonary-wise.  Lung exam is notable for  a few rhonchi and wheezes with adequate breath sounds.  Cardiac exam is  notable for regular rate and rhythm without any gallops or murmur.  Abdomen  is soft and nontender.  Extremities are negative for any edema.  She is  alert and oriented x3.  No lab work was repeated from yesterday.  Her pulse  is 84 per minute, temperature of 98.5 degrees Fahrenheit, BP of 127/84, and  O2 saturation on room air is 96%.   The patient is going to be discharged home on the above medications with  early followup with her primary care physician, Dr. Corrie Mckusick, of  [place].                                                Jackie Plum, M.D.    GO/MEDQ  D:  04/12/2004  T:  04/12/2004  Job:  [id number]   cc:   Corrie Mckusick, M.D.  [address] Laurell Josephs. [place]  [place]  [place] [address]  Fax: [phone]

## 2011-04-19 NOTE — H&P (Signed)
NAME:  Rebekah Henderson, Rebekah Henderson                            ACCOUNT NO.:  [id number]   MEDICAL RECORD NO.:  [id number]                   PATIENT TYPE:  EMS   LOCATION:  ED                                   FACILITY:  [facility]   PHYSICIAN:  Madelin Rear. Sherwood Gambler, M.D.             DATE OF BIRTH:  [birth year]   DATE OF ADMISSION:  04/03/2004  DATE OF DISCHARGE:                                HISTORY & PHYSICAL   CHIEF COMPLAINT:  Shortness of breath.   HISTORY OF PRESENT ILLNESS:  The patient has had progressively increasing  shortness of breath and cough culminating in severe dyspnea since last  evening.  She reports scant sputum production with positive fever and  chills, no true rigors.  She denies any chest pain or hemoptysis.   PAST MEDICAL HISTORY:  1. Pneumonia.  2. Chronic obstructive pulmonary disease.  3. Hypertension.  4. Penicillin.  5. Codeine.  6. Zantac allergy.  7. Coronary artery disease.   SOCIAL HISTORY:  Noncontributory.   FAMILY HISTORY:  Noncontributory.   REVIEW OF SYSTEMS:  As under HPI, else negative.   PHYSICAL EXAMINATION:  GENERAL:  She appears dusky and toxic.  Her head and  neck showed no JVD or adenopathy.  NECK:  Supple.  CHEST:  Exam shows diminished breath sounds bilaterally with end expiratory  wheezing and scattered rhonchi in all fields.  CARDIAC:  Regular rhythm without murmur, gallop or rub.  ABDOMEN:  Soft, no organomegaly or mass.  EXTREMITIES:  Without clubbing, cyanosis or edema.  NEUROLOGIC:  Nonfocal.   LABORATORY DATA:  Chest x-ray showed subsegmental atelectasis right lower  lobe, no definite infiltrates.  Laboratory reveals respiratory alkalosis and  hypoxemia on supplemental oxygen.  Labs were otherwise unrevealing.   IMPRESSION:  Severe exacerbation of chronic obstructive pulmonary disease,  probably secondary to bronchitis versus radiographically inapparent  pneumonia.   PLAN:  1. Start IV antibiotics, IV steroids, IV bronchodilator, around  the clock     nebulizers and pulmonary toilet.  2. Hypertension, monitor.  Continue home outpatient medications.  3. Coronary artery disease, monitor for an adverse problems and reaction as     appropriate.     ___________________________________________                                         Madelin Rear. Sherwood Gambler, M.D.   LJF/MEDQ  D:  04/03/2004  T:  04/03/2004  Job:  [id number]

## 2011-04-19 NOTE — H&P (Signed)
Rebekah Henderson, Rebekah Henderson                  ACCOUNT NO.:  [id number]   MEDICAL RECORD NO.:  [id number]          PATIENT TYPE:  INP   LOCATION:  A318                          FACILITY:  [facility]   PHYSICIAN:  Corrie Mckusick, M.D.  DATE OF BIRTH:  1956-11-02   DATE OF ADMISSION:  07/04/2006  DATE OF DISCHARGE:  LH                                HISTORY & PHYSICAL   ADMITTING DIAGNOSES:  1. Nausea.  2. Abdominal pain.  3. Diarrhea.   HISTORY OF PRESENTING ILLNESS:  This is a 55 year old female with a history  of hypertension, COPD, diverticulosis, irritable bowel and fibromyalgia who  presents now with 3-4 days of vomiting, diarrhea, body aches.  She has had  no fevers, there has been no blood in the stools, loads of cramps, no  melanotic stools.  She was seen on July 30th and started on Cipro and Flagyl  for probable diverticulitis as she was mildly tender in the left lower  quadrant.  I told her at that time we needed to do a CT if she was not  improved.  She came back today on the 3rd to the office with really no  improvement again but no fevers and no blood in the stools or other changes  in her symptoms.  I decided to go ahead and place her in the hospital for  further workup and care.   PAST MEDICAL HISTORY:  1. Hypertension.  2. COPD.  3. Anxiety.  4. Diverticulosis.  5. Irritable bowel.  6. Fibromyalgia.  7. History of hiatal hernia.   PAST SURGICAL HISTORY:  1. Hysterectomy in 1996.  2. Total cholecystectomy in 1999.  3. Tonsillectomy at age 39.  4. Finger surgery in 2001.  5. Lump removed, lumpectomy in the right breast 1985 for benign disease.   SOCIAL HISTORY:  Smoked 2 packs a day up until 2005.  Rare alcohol.   SOCIAL HISTORY:  Works at [employer].   FAMILY HISTORY:  Significant for breast cancer, loads of heart disease,  hypertension.   MEDICATIONS ON ADMISSION:  1. Lyrica 50 t.i.d.  2. HCTZ 25 mg daily.  3. Fluoxetine 40 mg daily.  4. Protonix 40 mg  b.i.d.  5. Premarin 1.25 daily.  6. Toprol 25 mg daily.   ALLERGIES:  1. PENICILLIN, CAUSING RASH.  2. ZANTAC, CAUSING RASH.  3. CODEINE, CAUSING RASH.  4. KETEK, CAUSING RASH.   PHYSICAL EXAM:  Temp 98.6, pulse 68, respirations 16, blood pressure is  114/74, weight 188.  When I saw her she was nauseous appearing, overall just  weak appearing.  HEENT:  Nasopharynx clear with moist mucous membranes.  NECK:  Supple, no lymphadenopathy, no thyromegaly.  CHEST:  Clear to auscultation bilaterally.  CARDIOVASCULAR:  Regular rate and rhythm, normal S1 and S2, no murmurs.  ABDOMEN:  Bowel sounds positive.  Still some mild tenderness in the left  lower quadrant, no rebound, no guarding, no flank pain, no suprapubic  tenderness.  EXTREMITIES:  No edema.   ASSESSMENT AND PLAN:  A 55 year old female with hypertension, chronic  obstructive pulmonary  disease, anxiety, diverticulosis, irritable bowel  syndrome, fibromyalgia and history of hiatal hernia who presents with  questionable diverticulitis.   PLAN:  1. Admit for double coverage with Cipro, Flagyl IV.  2. IV fluids until her electrolytes are obtained, will add low dose      potassium in there.  3. Check CBC, Chem 12 on admission.  4. Stool sent for stool cultures, ova and parasites, C. diff.  5. Consult GI.  6. Will continue her other medications for now and hold her n.p.o. meds      until GI sees the patient.  7. Also set up for a STAT CT of the abdomen and pelvis on admit.      Corrie Mckusick, M.D.  Electronically Signed     JCG/MEDQ  D:  07/04/2006  T:  07/04/2006  Job:  [id number]

## 2011-04-19 NOTE — Cardiovascular Report (Signed)
NAME:  Rebekah Henderson, OSTERLOH                            ACCOUNT NO.:  [id number]   MEDICAL RECORD NO.:  [id number]                   PATIENT TYPE:  INP   LOCATION:  3710                                 FACILITY:  [facility]   PHYSICIAN:  Darlin Priestly, M.D.             DATE OF BIRTH:  1956/01/26   DATE OF PROCEDURE:  08/12/2002  DATE OF DISCHARGE:                              CARDIAC CATHETERIZATION   PROCEDURES:  1. Left heart catheterization.  2. Coronary angiography.  3. Left ventriculogram.  4. Ascending aortography.  5. Abdominal aortogram.   COMPLICATIONS:  None.   INDICATIONS:  The patient is a 55 year old white female with a history of  extensive tobacco use, hypertension recently seen by Dr. Domingo Sep with a  complaint of chest pain. The patient was scheduled for exercise stress test  as well as a 2-D echocardiogram. However, she developed substernal chest  pain on August 11, 2002, which is partially relieved with sublingual  nitroglycerin and then sleep. She awoke on August 12, 2002, with  recurrent chest pain and now presents to the ER with persistent chest pain  without significant ECG changes.   DESCRIPTION OF PROCEDURE:  After given informed written consent, the patient  was brought to the cardiac catheterization lab where her right and left  groins were shaved, prepped, and draped in the usual sterile fashion.  ECG  monitoring was established.  Using modified Seldinger technique a #6 French  arterial sheath was inserted in the right femoral artery. Then, 6 French  diagnostic catheters were then used to perform diagnostic angiography.  This  reveals a large left main with no significant disease.   The LAD was a large vessel, which coursed to the apex and gave rise to one  diagonal branch.  The LAD is noted to have some mild 40% mid vessel  narrowing. The first diagonal is a large vessel which bifurcates distally  and has no significant disease.   The left  circumflex is a large vessel which coursed in the A-V groove and  gave rise to three obtuse marginal branches.  The AV groove circumflex has  no significant disease.  The first OM is a small vessel with no significant  disease. The second OM is a medium sized vessel with no significant disease.  The third OM is a large vessel which bifurcates in its distal segment and  has no significant disease.   The right coronary artery is a large vessel which is dominant, gives rise to  both the PDA as well as posterolateral branch.  There is no significant  disease in the RCA, PDA, or posterolateral branch.   LEFT VENTRICULOGRAM:  The left ventriculogram reveals a preserved EF of 60%.   Ascending aortography reveals moderately dilated aortic root with no  evidence of aortic regurgitation. There is a very small linear density noted  just adjacent to the ascending aortic arch,  which I could not exclude as a  possible aortic dissection. Again, there is no aortic regurgitation.   ABDOMINAL AORTOGRAM:  Abdominal aortogram reveals no evidence of significant  distal aortic disease or proximal iliac disease.   HEMODYNAMICS:  Systemic arterial pressure 124/72, LV systemic pressure  125/12, LVEDP of 17.   CONCLUSION:  1. No significant coronary artery disease.  2. Normal left ventricular systolic function.  3. Moderately dilated aortic root with questionable linear density noted     just adjacent to the ascending arch which may be consistent with aortic     dissection.  4. No evidence of significant distal aortic disease.  5. Successful closure of the right femoral site using a Perclose device.                                                   Darlin Priestly, M.D.    RHM/MEDQ  D:  08/12/2002  T:  08/14/2002  Job:  [id number]   cc:   Sherral Hammers, M.D.

## 2011-04-19 NOTE — Discharge Summary (Signed)
NAME:  Rebekah Henderson, Rebekah Henderson                            ACCOUNT NO.:  [id number]   MEDICAL RECORD NO.:  [id number]                   PATIENT TYPE:  INP   LOCATION:  3710                                 FACILITY:  [facility]   PHYSICIAN:  Sherral Hammers, M.D.               DATE OF BIRTH:  [birth year]   DATE OF ADMISSION:  08/12/2002  DATE OF DISCHARGE:  08/14/2002                                 DISCHARGE SUMMARY   ADMISSION DIAGNOSES:  1. Unstable angina.  2. Hypertension.  3. Ongoing tobacco use.  4. History of depression and anxiety disorder.  5. History of diverticulitis.  6. History of hysterectomy.  7. History of cholecystectomy.  8. History of benign breast lumps, status post biopsy.  9. Status post breast augmentation.  10.      Status post bladder tacking.   DISCHARGE DIAGNOSES:  1. Unstable angina.  2. Hypertension.  3. Ongoing tobacco use.  4. History of depression and anxiety disorder.  5. History of diverticulitis.  6. History of hysterectomy.  7. History of cholecystectomy.  8. History of benign breast lumps, status post biopsy.  9. Status post breast augmentation.  10.      Status post bladder tacking.  11.      Status post cardiac catheterization on August 12, 2002, by     Darlin Priestly, M.D.  This revealed a 40% stenosis in the mid LAD.  No     other coronary artery disease.  EF 60%.  At the ascending aorta, there     was questionable linear dissection in the ascending knob, but there was     no aortic insufficiency.  Post procedure, Dr. Jenne Campus planned to continue     heparin and beta blockers for blood pressure control and planned to     obtain a follow-up CT scan to rule out dissection.  A follow-up CT scan     was performed and revealed no dissection.   HOSPITAL COURSE:  Post catheterization, Darlin Priestly, M.D., spoke with  Alleen Borne, M.D., and asked him to review her catheterization films to  assess whether she may have some aortic dissection.   He reviewed the  catheterization and he agreed that there was some dilatation of the  ascending aorta.  There may be a septal shadow of the wall of the ascending  aorta, but it was not clear.  As well, there was some irregularity of the  distal aortic arch.  He agreed with CT scan of the chest, which probably is  the best test to tell if this was aortic dissection.  He felt that with the  patient's history of dyspnea on exertion and chest pain for over one month  with a recent exacerbation was not what he would expect from an aortic arch  dissection.  She had no significant coronary artery disease and no AI at  catheterization.  He would await the CT of the chest and decide if there was  any surgical problem.   Again, the chest CT was performed the following morning and showed no  dissection.   However, the following morning, she continued to have significant chest  pain/epigastric pain, as well as nausea and vomiting.  Given her normal  catheterization and CT scan, we plan to check liver function tests, amylase,  and lipase and call Griffith Citron, M.D., for GI evaluation.  He planned  to perform an EGD that afternoon.   On the evening of August 13, 2002, the patient underwent EGD by Griffith Citron, M.D.  She was found to have the following:  1.  Normal esophagus  with no evidence of GERD or explanation for chest pain.  2.  Large hiatal  hernia.  3.  Retained gastric food versus noncompliant with NPO orders.  At  that time, he recommended:  1.  Empiric trial of proton pump inhibitor for  one month to see if the chest pain resolved.  2.  No further GI work-up  recommended for chest pain.  It was felt that if her symptoms persist for  over the next few months, then she could come back for a follow-up office  visit with Griffith Citron, M.D.   On August 14, 2002, the patient was seen and evaluated by Richard A.  Alanda Amass, M.D., and was deemed stable for discharge home.   At this time,  her systolic blood pressure was 110 and stable.  She was maintaining normal  sinus rhythm.  Her wound site was stable post catheterization.  The  hemoglobin and hematocrit were within normal range.  At this time, she was  deemed stable for discharge home.   HOSPITAL CONSULTS:  1. Cardiovascular thoracic surgery consultation by Alleen Borne, M.D., on     August 12, 2002.  At the time of cardiac catheterization, Darlin Priestly, M.D., was uncertain whether she may have some linear dissection     in the ascending aorta.  Dr. Laneta Simmers was asked to review the case.  He     reviewed the catheterization and felt that there was some dilatation of     the ascending aorta.  He felt there may be a septal shadow of the wall of     the ascending aorta, but it was not clear.  As well, she has an     irregularity of the distal aortic arch.  He agreed that CT of the chest     was probably the best test to tell if this was an aortic dissection.  He     felt that her history of dyspnea on exertion and chest pain for greater     than one month with recent exacerbation is not what he would expect for     an aortic arch dissection.  It was noted that she had no significant     coronary artery disease and no AI at catheterization.  He waited the CT     of the chest results to see if there was any surgical problem.  2. GI consult on August 13, 2002, by Griffith Citron, M.D.  He was     consulted for ongoing chest pain/epigastric pain, as well as nausea and     vomiting.  He recommended EGD.   HOSPITAL PROCEDURES:  1. Cardiac catheterization on August 12, 2002, by Darlin Priestly,  M.D.     She was found to have a 40% mid LAD stenosis.  No other CAD.  EF 60%.     The ascending aorta showed that in the mid ascending arch there was a     question of linear dissection.  However, there was no aortic    insufficiency.  The abdominal aorta showed no significant distal aorta or      proximal iliac disease.  She tolerated the procedure well and had no     complications.  He planned to continue IV heparin, as well as beta     blockers, and to obtain a CT scan in the morning to rule out dissection.     As well, he spoke with Alleen Borne, M.D., to review the case.  2. Esophagogastroduodenoscopy performed on August 13, 2002, by Griffith Citron, M.D.  This revealed the following:  1.  Normal esophagus with no     evidence for GERD or explanation for chest pain.  2.  Large hiatal     hernia.  3.  Retained gastric food versus noncompliant with NPO orders.     At that time, he recommended:  1.  Empiric trial of proton pump inhibitor     for one month to see if chest pain resolves.  2.  No further GI work-up     recommended for chest pain.  3.  If the symptoms persist for over several     months, then the patient will need to follow up with him as an outpatient     to office visit.   LABORATORY DATA:  TSH normal at 2.569.  The lipid profile showed total  cholesterol 144, triglycerides 120, HDL 74, and LDL 46.  Cardiac enzymes  negative x 3 with CKs 75, 66, and 57, MBs 1.2, 1.0, and 0.9, and troponins  0.01, 0.02, and 0.01.  Sodium 137, potassium 3.5, chloride 102, CO2 26,  glucose 94, BUN 8, creatinine 0.6.  Liver function tests were normal.  As  well, amylase normal at 81 and lipase normal at 19.  White count 7.7,  hemoglobin 14.9, hematocrit 43, platelets 306.  These all remained stable  throughout the hospitalization.  PT 11.79, INR 0.8, PTT 31.  Thereafter, the  PTTs were elevated on IV heparin.   The chest x-ray on August 12, 2002, showed no evidence of acute disease.   A chest CT on August 13, 2002, showed negative for thoracic aortic  dissection or other acute abnormality.  It was felt that there was mild  atheromatous change in the abdominal aorta without dissection or aneurysm.  Origins of renal and visceral arteries were widely patent,  unremarkable  imaging of the liver, spleen, kidneys, pancreas, and adrenal glands, and  small bowel and colon unremarkable.   The EKG showed sinus bradycardia at 57 beats per minute and nonspecific ST-T  change.   DISCHARGE MEDICATIONS:  1. Procardia 30 mg once a day.  2. Maxzide, same dose as before, once a day.  3. Prozac 20 mg once a day.  4. Premarin 1.25 mg once a day.  5. ProSom 2 mg at night.  6. Calcium plus D once a day.  7. Xanax 0.5 mg as needed.  8. Protonix 40 mg once a day for one month and then as needed.   SPECIAL INSTRUCTIONS:  If your nausea and vomiting persists after one to two  months, then you can follow up with Tinnie Gens  Nolon Stalls, M.D.   ACTIVITY:  No strenuous activity, lifting greater than 5 pounds, driving, or  sexual activity for two more days.   WOUND CARE:  May gently wash the groin site with warm water and soap.  Call [phone] if any bleeding or increased size or pain of the groin site.   FOLLOW UP:  Follow up with Sherral Hammers, M.D., in the [place], [place], office on August 30, 2002, at 11 a.m.  She already had that  appointment made.  As well, she needed to keep her appointment in the  [place], [place], office for an echocardiogram on August 17, 2002, at 10 a.m.  However, the appointment that she did have for her  Cardiolite had been canceled since she had had a catheterization.     Mary B. Easley, P.A.-C.                   Sherral Hammers, M.D.    MBE/MEDQ  D:  08/20/2002  T:  08/24/2002  Job:  [id number]   cc:   Jonell Cluck, M.D.  [address], [address]  [place]  [place] [address]  Fax: [phone]   Griffith Citron, M.D.

## 2011-04-19 NOTE — Procedures (Signed)
   NAMEJOLENE, Rebekah Henderson                              ACCOUNT NO.:  [id number]   MEDICAL RECORD NO.:  [id number]                    PATIENT TYPE:   LOCATION:                                       FACILITY:   PHYSICIAN:  Edward L. Juanetta Gosling, M.D.             DATE OF BIRTH:   DATE OF PROCEDURE:  08/16/2003  DATE OF DISCHARGE:                              PULMONARY FUNCTION TEST   IMPRESSION:  1. Spirometry shows mild-to-moderate ventilatory defect with evidence of     airflow obstruction.  2. Lung volumes show mild restrictive change and fairly marked air trapping.  3. DLCO is severely reduced.  4. There is marked relative resting hypoxemia.  5. There is no significant bronchodilator effect.                                               Edward L. Juanetta Gosling, M.D.    ELH/MEDQ  D:  08/16/2003  T:  08/16/2003  Job:  [id number]   cc:   Patrica Duel, M.D.  [address], [address]  [place]  [place] [address]  Fax: [phone]

## 2011-04-19 NOTE — Discharge Summary (Signed)
[name], Rebekah Henderson                  ACCOUNT NO.:  [id number]   MEDICAL RECORD NO.:  [id number]          PATIENT TYPE:  INP   LOCATION:  A318                          FACILITY:  [facility]   PHYSICIAN:  Rebekah Henderson, M.D.  DATE OF BIRTH:  1956-09-26   DATE OF ADMISSION:  07/04/2006  DATE OF DISCHARGE:  08/05/2007LH                                 DISCHARGE SUMMARY   HISTORY OF PRESENT ILLNESS AND PAST MEDICAL HISTORY:  Please see admission  H&P.   HOSPITAL COURSE:  This is a 55 year old female with hypertension, COPD,  anxiety, diverticulosis, irritable bowel, and fibromyalgia who presented  with questionable diverticulitis.  She was admitted for double coverage  Cipro and Flagyl.  Stool was sent for stool cultures, ova, parasites, and C.  difficile.  GI was consulted.  We also did a stat CT of the abdomen and  pelvis.   GI consult was greatly appreciated.  See their note for details.  They have  felt like this was a flare-up of her IBS and doubted diverticulitis.  We  continued her antibiotics despite this.  Nebulizers were continued.  The  patient was ready for discharge on the 5th.  Dr. Sherwood Henderson saw the patient on  day of discharge.  Please see his note for details.   CONDITION ON DISCHARGE:  Improved and stable.   DISCHARGE MEDICATIONS:  Same as home medications.  No antibiotics were sent  at discharge per Dr. Sherwood Henderson and per Dr. Karilyn Henderson.   Follow-up with myself in one week.      Rebekah Henderson, M.D.  Electronically Signed     JCG/MEDQ  D:  07/14/2006  T:  07/14/2006  Job:  [id number]

## 2011-04-19 NOTE — H&P (Signed)
   NAME:  Rebekah Henderson, Rebekah Henderson                            ACCOUNT NO.:  [id number]   MEDICAL RECORD NO.:  [id number]                   PATIENT TYPE:  INP   LOCATION:  IC06                                 FACILITY:  [facility]   PHYSICIAN:  Madelin Rear. Sherwood Gambler, M.D.             DATE OF BIRTH:  05/11/1956   DATE OF ADMISSION:  06/06/2003  DATE OF DISCHARGE:                                HISTORY & PHYSICAL   CHIEF COMPLAINT:  Shortness of breath.   HISTORY OF PRESENT ILLNESS:  The patient has progressively increasing  shortness of breath despite adequate outpatient therapy with oral  medications and antibiotic usage.  She developed severe increase in dyspnea  which prompted presentation to the emergency department where she was  evaluated by Dr. Ernestina Penna.  Her cough has been mostly nonproductive.  She had  associated pleuritic chest discomfort with sharp pain with coughing in the  right lower chest wall.  Exertion made this pain worse.   PAST MEDICAL HISTORY:  1. Coronary artery disease.  2. Hypertension.  3. Depression.  4. Diverticulitis.  5. Status post hysterectomy.  6. Status post cholecystectomy.  7. Status post biopsy of benign breast lumps.  8. Status post breast augmentation surgery.  9. Status post bladder tacking.   She is notably allergic to PENICILLIN and CODEINE, as well as ZANTAC.   FAMILY HISTORY:  Noncontributory.   REVIEW OF SYSTEMS:  Review of systems as under HPI.  All else negative.   PHYSICAL EXAMINATION:  SKIN:  Unremarkable.  HEAD AND NECK:  No JVD or adenopathy.  Neck is supple.  CHEST:  Scattered rhonchi and expiratory wheezing.  CARDIAC:  Regular rhythm.  No gallop or rub.  ABDOMEN:  Soft.  No organomegaly or masses.  EXTREMITIES:  Without clubbing, cyanosis, or edema.  NEUROLOGIC:  Examination nonfocal.   Chest x-ray was reported to me by the emergency room physician showing no  acute infiltrates.  There was no pneumothorax identified.  Radiology  interpretation is pending at the present time, will be reviewed when  available.   She is admitted for intravenous bronchodilators, intravenous antibiotics.  Serial cardiac enzymes and  D-dimer will be obtained to rule out pulmonary embolus.  The prognosis is  good.                                               Madelin Rear. Sherwood Gambler, M.D.    LJF/MEDQ  D:  06/07/2003  T:  06/07/2003  Job:  [id number]

## 2011-04-19 NOTE — Discharge Summary (Signed)
   NAME:  Rebekah Henderson, Rebekah Henderson                            ACCOUNT NO.:  [id number]   MEDICAL RECORD NO.:  [id number]                   PATIENT TYPE:  INP   LOCATION:  A209                                 FACILITY:  [facility]   PHYSICIAN:  Madelin Rear. Sherwood Gambler, M.D.             DATE OF BIRTH:  1956-03-04   DATE OF ADMISSION:  06/06/2003  DATE OF DISCHARGE:  06/09/2003                                 DISCHARGE SUMMARY   DISCHARGE DIAGNOSES:  1. Acute exacerbation of chronic obstructive pulmonary disease.  2. Bronchitis.   DISCHARGE MEDICATIONS:  1. Protonix 40 mg b.i.d.  2. Theo-Dur 300 mg p.o. b.i.d.  3. Medrol Dosepak.  4. Combivent 2 puffs q.i.d.  5. __________ 750 mg p.o. daily x five days.   SUMMARY:  The patient was admitted with progressively increasing shortness  of breath in spite of maximal outpatient therapy.  She was admitted with  some atypical chest pain with negative cardiac enzymes.  She responded to IV  antibiotics and bronchodilator as well as steroids to the point of maximal  improvement on the day of discharge.  She will be seen in the office in  follow-up one week postdischarge.  Return sooner p.r.n.                                               Madelin Rear. Sherwood Gambler, M.D.    LJF/MEDQ  D:  06/09/2003  T:  06/09/2003  Job:  [id number]

## 2011-08-01 ENCOUNTER — Emergency Department (HOSPITAL_COMMUNITY)
Admission: EM | Admit: 2011-08-01 | Discharge: 2011-08-01 | Disposition: A | Discharge status: Home / Self Care | Payer: Medicare Other | Attending: Emergency Medicine | Admitting: Emergency Medicine

## 2011-08-01 ENCOUNTER — Emergency Department (HOSPITAL_COMMUNITY): Payer: Medicare Other

## 2011-08-01 ENCOUNTER — Encounter: Payer: Self-pay | Admitting: Emergency Medicine

## 2011-08-01 DIAGNOSIS — J4489 Other specified chronic obstructive pulmonary disease: Secondary | ICD-10-CM | POA: Insufficient documentation

## 2011-08-01 DIAGNOSIS — R197 Diarrhea, unspecified: Secondary | ICD-10-CM | POA: Insufficient documentation

## 2011-08-01 DIAGNOSIS — R112 Nausea with vomiting, unspecified: Secondary | ICD-10-CM | POA: Insufficient documentation

## 2011-08-01 DIAGNOSIS — K5732 Diverticulitis of large intestine without perforation or abscess without bleeding: Secondary | ICD-10-CM | POA: Insufficient documentation

## 2011-08-01 DIAGNOSIS — J449 Chronic obstructive pulmonary disease, unspecified: Secondary | ICD-10-CM | POA: Insufficient documentation

## 2011-08-01 HISTORY — DX: Chronic obstructive pulmonary disease, unspecified: J44.9

## 2011-08-01 HISTORY — DX: Essential (primary) hypertension: I10

## 2011-08-01 HISTORY — DX: Fibromyalgia: M79.7

## 2011-08-01 HISTORY — DX: Irritable bowel syndrome, unspecified: K58.9

## 2011-08-01 HISTORY — DX: Gastro-esophageal reflux disease without esophagitis: K21.9

## 2011-08-01 LAB — CBC
HCT: 39.4 % (ref 36.0–46.0)
Hemoglobin: 13.4 g/dL (ref 12.0–15.0)
MCH: 29.6 pg (ref 26.0–34.0)
MCHC: 34 g/dL (ref 30.0–36.0)
MCV: 87 fL (ref 78.0–100.0)

## 2011-08-01 LAB — BASIC METABOLIC PANEL
BUN: 9 mg/dL (ref 6–23)
Chloride: 99 mEq/L (ref 96–112)
Creatinine, Ser: 0.51 mg/dL (ref 0.50–1.10)
Glucose, Bld: 114 mg/dL — ABNORMAL HIGH (ref 70–99)
Potassium: 3.9 mEq/L (ref 3.5–5.1)

## 2011-08-01 LAB — URINALYSIS, ROUTINE W REFLEX MICROSCOPIC
Bilirubin Urine: NEGATIVE
Glucose, UA: NEGATIVE mg/dL
Hgb urine dipstick: NEGATIVE
Ketones, ur: NEGATIVE mg/dL
Protein, ur: NEGATIVE mg/dL

## 2011-08-01 MED ORDER — PANTOPRAZOLE SODIUM 40 MG IV SOLR
40.0000 mg | Freq: Once | INTRAVENOUS | Status: AC
  Administered 2011-08-01: 40 mg via INTRAVENOUS
  Filled 2011-08-01: qty 40

## 2011-08-01 MED ORDER — CIPROFLOXACIN HCL 500 MG PO TABS: 500.0000 mg | ORAL_TABLET | Freq: Two times a day (BID) | ORAL | Status: AC

## 2011-08-01 MED ORDER — ONDANSETRON HCL 4 MG/2ML IJ SOLN
4.0000 mg | Freq: Once | INTRAMUSCULAR | Status: AC
  Administered 2011-08-01: 4 mg via INTRAVENOUS
  Filled 2011-08-01: qty 2

## 2011-08-01 MED ORDER — METRONIDAZOLE 500 MG PO TABS
500.0000 mg | ORAL_TABLET | Freq: Once | ORAL | Status: AC
  Administered 2011-08-01: 500 mg via ORAL
  Filled 2011-08-01: qty 1

## 2011-08-01 MED ORDER — ONDANSETRON 4 MG PO TBDP: 4.0000 mg | ORAL_TABLET | Freq: Three times a day (TID) | ORAL | Status: AC | PRN

## 2011-08-01 MED ORDER — HYDROMORPHONE HCL 1 MG/ML IJ SOLN
1.0000 mg | Freq: Once | INTRAMUSCULAR | Status: AC
  Administered 2011-08-01: 1 mg via INTRAMUSCULAR
  Filled 2011-08-01: qty 1

## 2011-08-01 MED ORDER — CIPROFLOXACIN HCL 250 MG PO TABS
500.0000 mg | ORAL_TABLET | Freq: Once | ORAL | Status: AC
  Administered 2011-08-01: 500 mg via ORAL
  Filled 2011-08-01: qty 2

## 2011-08-01 MED ORDER — METRONIDAZOLE 500 MG PO TABS: 500.0000 mg | ORAL_TABLET | Freq: Two times a day (BID) | ORAL | Status: AC

## 2011-08-01 NOTE — ED Notes (Signed)
Patient states she has had vomiting and diarrhea x 1 week; states felt better yesterday, but began getting worse tonight.

## 2011-08-21 LAB — URINALYSIS, ROUTINE W REFLEX MICROSCOPIC
Bilirubin Urine: NEGATIVE
Glucose, UA: NEGATIVE
Hgb urine dipstick: NEGATIVE
Ketones, ur: NEGATIVE
Nitrite: NEGATIVE
Protein, ur: NEGATIVE
Specific Gravity, Urine: 1.014
Urobilinogen, UA: 0.2
pH: 6

## 2011-08-21 LAB — COMPREHENSIVE METABOLIC PANEL
BUN: 10
Calcium: 9.3
Glucose, Bld: 86
Sodium: 142
Total Protein: 6.5

## 2011-08-21 LAB — D-DIMER, QUANTITATIVE: D-Dimer, Quant: 0.3

## 2011-08-21 LAB — H. PYLORI ANTIBODY, IGG: H Pylori IgG: 0.4

## 2011-08-21 LAB — CBC
HCT: 43.3
Hemoglobin: 14.9
MCHC: 34.4
MCV: 88.9
Platelets: 355
RBC: 4.87
RDW: 13.3
WBC: 9.3

## 2011-08-21 LAB — CLOSTRIDIUM DIFFICILE EIA: C difficile Toxins A+B, EIA: NEGATIVE

## 2011-08-21 LAB — I-STAT 8, (EC8 V) (CONVERTED LAB)
BUN: 11
Bicarbonate: 28.6 — ABNORMAL HIGH
Chloride: 104
HCT: 45
Hemoglobin: 15.3 — ABNORMAL HIGH
Operator id: 272551
Sodium: 138

## 2011-08-21 LAB — LIPID PANEL
Cholesterol: 153
HDL: 63
LDL Cholesterol: 32
Total CHOL/HDL Ratio: 2.4
Triglycerides: 288 — ABNORMAL HIGH
VLDL: 58 — ABNORMAL HIGH

## 2011-08-21 LAB — APTT: aPTT: 30

## 2011-08-21 LAB — DIFFERENTIAL
Lymphs Abs: 2.5
Monocytes Relative: 8
Neutro Abs: 6
Neutrophils Relative %: 64

## 2011-08-21 LAB — CARDIAC PANEL(CRET KIN+CKTOT+MB+TROPI)
CK, MB: 0.5
Relative Index: INVALID
Total CK: 33
Troponin I: 0.02

## 2011-08-21 LAB — HEPATIC FUNCTION PANEL
ALT: 9
Albumin: 2.5 — ABNORMAL LOW
Alkaline Phosphatase: 45
Total Protein: 5 — ABNORMAL LOW

## 2011-08-21 LAB — LIPASE, BLOOD: Lipase: 24

## 2011-08-21 LAB — BASIC METABOLIC PANEL
Chloride: 105
Creatinine, Ser: 0.54
GFR calc Af Amer: >60
Potassium: 3.3 — ABNORMAL LOW
Sodium: 139

## 2011-08-21 LAB — CK TOTAL AND CKMB (NOT AT ARMC)
Relative Index: INVALID
Total CK: 31

## 2011-08-21 LAB — MAGNESIUM
Magnesium: 1.6
Magnesium: 1.7

## 2011-08-21 LAB — POCT I-STAT CREATININE: Creatinine, Ser: 0.9

## 2011-08-21 LAB — GIARDIA/CRYPTOSPORIDIUM SCREEN(EIA)
Cryptosporidium Screen (EIA): NEGATIVE
Giardia Screen - EIA: NEGATIVE

## 2011-08-21 LAB — POCT CARDIAC MARKERS: Myoglobin, poc: 44.4

## 2011-08-21 LAB — PROTIME-INR: INR: 1

## 2011-08-29 LAB — CBC
HCT: 45.6
MCV: 87.3
Platelets: 325
WBC: 8

## 2011-08-29 LAB — BASIC METABOLIC PANEL
BUN: 13
Chloride: 99
Potassium: 3.9

## 2011-08-29 LAB — DIFFERENTIAL
Eosinophils Absolute: 0.1
Eosinophils Relative: 1
Lymphs Abs: 1.9
Monocytes Relative: 6

## 2011-10-01 NOTE — ED Provider Notes (Signed)
History     CSN: [id number] Arrival date & time: 08/01/2011  1:19 AM   None     Chief Complaint  Patient presents with  . Emesis    (Consider location/radiation/quality/duration/timing/severity/associated sxs/prior treatment) HPI Comments: Patient with vomiting and diarrhea x 1 week. Thought she was improving and got worse tonight. Pain to LLQ. Denies fever, chills.   Patient is a 55 y.o. female presenting with vomiting.  Emesis  Associated symptoms include abdominal pain and diarrhea.    Past Medical History  Diagnosis Date  . COPD (chronic obstructive pulmonary disease)   . Hypertension   . Fibromyalgia   . IBS (irritable bowel syndrome)   . GERD (gastroesophageal reflux disease)     No past surgical history on file.  No family history on file.  History  Substance Use Topics  . Smoking status: Former Games developer  . Smokeless tobacco: Not on file  . Alcohol Use: No    OB History    Grav Para Term Preterm Abortions TAB SAB Ect Mult Living                  Review of Systems  Gastrointestinal: Positive for nausea, vomiting, abdominal pain and diarrhea.  All other systems reviewed and are negative.    Allergies  Penicillins; Codeine; and Zantac  Home Medications   Current Outpatient Rx  Name Route Sig Dispense Refill  . ALBUTEROL SULFATE 2 MG PO TABS Oral Take 2 mg by mouth 4 (four) times daily.      Marland Kitchen AMLODIPINE BESYLATE 5 MG PO TABS Oral Take 5 mg by mouth daily.      . CYCLOBENZAPRINE HCL 10 MG PO TABS Oral Take 10 mg by mouth 3 (three) times daily as needed.      Marland Kitchen DICYCLOMINE HCL 20 MG PO TABS Oral Take 20 mg by mouth every 6 (six) hours.      . ESTRADIOL 2 MG PO TABS Oral Take 2 mg by mouth daily.      Marland Kitchen ESTROGENS CONJUGATED 1.25 MG PO TABS Oral Take 1.25 mg by mouth daily.      Marland Kitchen FLUOXETINE HCL 40 MG PO CAPS Oral Take 40 mg by mouth daily.      Marland Kitchen HYDROCODONE-ACETAMINOPHEN 2.5-500 MG PO TABS Oral Take 1 tablet by mouth every 6 (six) hours as needed.       Marland Kitchen LEVOTHYROXINE SODIUM 25 MCG PO TABS Oral Take 25 mcg by mouth daily.      Marland Kitchen METOPROLOL TARTRATE 25 MG PO TABS Oral Take 25 mg by mouth daily.      Marland Kitchen PANTOPRAZOLE SODIUM 40 MG PO TBEC Oral Take 40 mg by mouth daily.      Marland Kitchen PENTAZOCINE-ACETAMINOPHEN 25-650 MG PO TABS Oral Take 1 tablet by mouth every 6 (six) hours as needed.      . TRIAMTERENE-HCTZ 37.5-25 MG PO CAPS Oral Take 1 capsule by mouth every morning.        BP 117/68  Pulse 80  Temp(Src) 98.7 F (37.1 C) (Oral)  Resp 20  Ht 5\' 4"  (1.626 m)  Wt 210 lb (95.255 kg)  BMI 36.05 kg/m2  SpO2 94%  Physical Exam  Nursing note and vitals reviewed. Constitutional: She is oriented to person, place, and time. She appears well-developed and well-nourished.  HENT:  Head: Normocephalic.  Eyes: EOM are normal.  Neck: Normal range of motion.  Cardiovascular: Normal rate, normal heart sounds and intact distal pulses.   Pulmonary/Chest: Breath sounds normal.  Abdominal: Soft. Bowel sounds are normal. She exhibits no distension and no mass. There is tenderness. There is no rebound and no guarding.       LLQ tenderness  Musculoskeletal: Normal range of motion.  Neurological: She is alert and oriented to person, place, and time.  Skin: Skin is warm and dry.    ED Course  Procedures (including critical care time)  Labs Reviewed  BASIC METABOLIC PANEL - Abnormal; Notable for the following:    Glucose, Bld 114 (*)    All other components within normal limits  CBC - Abnormal; Notable for the following:    WBC 17.9 (*)    All other components within normal limits  URINALYSIS, ROUTINE W REFLEX MICROSCOPIC - Abnormal; Notable for the following:    Color, Urine AMBER (*) BIOCHEMICALS MAY BE AFFECTED BY COLOR   Appearance HAZY (*)    Specific Gravity, Urine >1.030 (*)    All other components within normal limits  LAB REPORT - SCANNED   No results found.   1. Nausea and vomiting   2. Diverticulitis of colon (without mention of  hemorrhage)       MDM          Nicoletta Dress. Colon Branch, MD 10/01/11 [phone]

## 2012-06-02 ENCOUNTER — Other Ambulatory Visit (HOSPITAL_COMMUNITY): Payer: Self-pay | Admitting: Internal Medicine

## 2012-06-02 DIAGNOSIS — M5412 Radiculopathy, cervical region: Secondary | ICD-10-CM

## 2012-06-05 ENCOUNTER — Other Ambulatory Visit (HOSPITAL_COMMUNITY): Payer: Medicare Other

## 2012-06-05 ENCOUNTER — Ambulatory Visit (HOSPITAL_COMMUNITY)
Admission: RE | Admit: 2012-06-05 | Discharge: 2012-06-05 | Disposition: A | Discharge status: Home / Self Care | Payer: Medicare Other | Source: Ambulatory Visit | Attending: Internal Medicine | Admitting: Internal Medicine

## 2012-06-05 DIAGNOSIS — M5030 Other cervical disc degeneration, unspecified cervical region: Secondary | ICD-10-CM | POA: Insufficient documentation

## 2012-06-05 DIAGNOSIS — M5412 Radiculopathy, cervical region: Secondary | ICD-10-CM

## 2012-06-05 DIAGNOSIS — M25519 Pain in unspecified shoulder: Secondary | ICD-10-CM | POA: Insufficient documentation

## 2012-06-05 DIAGNOSIS — M542 Cervicalgia: Secondary | ICD-10-CM | POA: Insufficient documentation

## 2012-07-02 HISTORY — PX: CERVICAL FUSION: SHX112

## 2012-12-28 ENCOUNTER — Ambulatory Visit (HOSPITAL_COMMUNITY)
Admission: RE | Admit: 2012-12-28 | Discharge: 2012-12-28 | Disposition: A | Discharge status: Home / Self Care | Payer: Medicare Other | Source: Ambulatory Visit | Attending: Internal Medicine | Admitting: Internal Medicine

## 2012-12-28 ENCOUNTER — Other Ambulatory Visit (HOSPITAL_COMMUNITY): Payer: Self-pay | Admitting: Internal Medicine

## 2012-12-28 DIAGNOSIS — R059 Cough, unspecified: Secondary | ICD-10-CM

## 2012-12-28 DIAGNOSIS — R05 Cough: Secondary | ICD-10-CM

## 2012-12-28 DIAGNOSIS — R0602 Shortness of breath: Secondary | ICD-10-CM | POA: Insufficient documentation

## 2013-03-10 ENCOUNTER — Other Ambulatory Visit (HOSPITAL_COMMUNITY): Payer: Self-pay | Admitting: Internal Medicine

## 2013-03-10 DIAGNOSIS — Z139 Encounter for screening, unspecified: Secondary | ICD-10-CM

## 2013-03-15 ENCOUNTER — Ambulatory Visit (HOSPITAL_COMMUNITY)
Admission: RE | Admit: 2013-03-15 | Discharge: 2013-03-15 | Disposition: A | Discharge status: Home / Self Care | Payer: Medicare Other | Source: Ambulatory Visit | Attending: Internal Medicine | Admitting: Internal Medicine

## 2013-03-15 DIAGNOSIS — Z1231 Encounter for screening mammogram for malignant neoplasm of breast: Secondary | ICD-10-CM | POA: Insufficient documentation

## 2013-03-15 DIAGNOSIS — Z139 Encounter for screening, unspecified: Secondary | ICD-10-CM

## 2013-03-22 ENCOUNTER — Telehealth: Payer: Self-pay

## 2013-03-22 NOTE — Telephone Encounter (Signed)
Pt was referred by Dr. Regino Schultze for screening colonoscopy. Called and line was busy.

## 2013-03-24 NOTE — Telephone Encounter (Signed)
I called pt. She wanted to know who the doctors are that are here. I told her. She asked who the other one was that use to be here, I told her Dr. Karilyn Cota and also gave her his phone number. She said she might go to [place] or to [place] , she was not sure. Sending a letter to PCP.

## 2013-05-12 ENCOUNTER — Encounter (INDEPENDENT_AMBULATORY_CARE_PROVIDER_SITE_OTHER): Payer: Medicare Other | Admitting: Gastroenterology

## 2013-05-12 ENCOUNTER — Encounter: Payer: Self-pay | Admitting: Gastroenterology

## 2013-05-12 ENCOUNTER — Ambulatory Visit (INDEPENDENT_AMBULATORY_CARE_PROVIDER_SITE_OTHER): Payer: Medicare Other | Admitting: Gastroenterology

## 2013-05-12 VITALS — BP 130/78 | HR 70 | Temp 98.4°F | Ht 64.0 in | Wt 211.8 lb

## 2013-05-12 DIAGNOSIS — R197 Diarrhea, unspecified: Secondary | ICD-10-CM

## 2013-05-12 DIAGNOSIS — R109 Unspecified abdominal pain: Secondary | ICD-10-CM

## 2013-05-12 LAB — COMPLETE METABOLIC PANEL WITH GFR
Albumin: 3.7 g/dL (ref 3.5–5.2)
BUN: 12 mg/dL (ref 6–23)
Calcium: 9.8 mg/dL (ref 8.4–10.5)
Chloride: 95 mEq/L — ABNORMAL LOW (ref 96–112)
GFR, Est Non African American: >89 mL/min
Glucose, Bld: 84 mg/dL (ref 70–99)
Potassium: 4 mEq/L (ref 3.5–5.3)

## 2013-05-12 LAB — URINALYSIS, ROUTINE W REFLEX MICROSCOPIC
Bilirubin Urine: NEGATIVE
Glucose, UA: NEGATIVE mg/dL
Hgb urine dipstick: NEGATIVE
Ketones, ur: NEGATIVE mg/dL
Protein, ur: NEGATIVE mg/dL

## 2013-05-12 MED ORDER — DICYCLOMINE HCL 20 MG PO TABS: ORAL_TABLET | ORAL | Status: DC

## 2013-05-12 MED ORDER — PEG 3350-KCL-NA BICARB-NACL 420 G PO SOLR: 4000.0000 mL | ORAL | Status: DC

## 2013-05-12 NOTE — Patient Instructions (Signed)
GET BLOOD DRAWN.  SUBMIT STOOL STUDIES.   USE BENTYL FOR DIARRHEA.  FOLLOW A LOW FAT DIET. SEE INFO BELOW.  UPPER AND LOWER ENDOSCOPY ON JUN 24.  FOLLOW UP IN 3 MOS.  Low-Fat Diet BREADS, CEREALS, PASTA, RICE, DRIED PEAS, AND BEANS These products are high in carbohydrates and most are low in fat. Therefore, they can be increased in the diet as substitutes for fatty foods. They too, however, contain calories and should not be eaten in excess. Cereals can be eaten for snacks as well as for breakfast.   FRUITS AND VEGETABLES It is good to eat fruits and vegetables. Besides being sources of fiber, both are rich in vitamins and some minerals. They help you get the daily allowances of these nutrients. Fruits and vegetables can be used for snacks and desserts.  MEATS Limit lean meat, chicken, [place], and fish to no more than 6 ounces per day. Beef, Pork, and Lamb Use lean cuts of beef, pork, and lamb. Lean cuts include:  Extra-lean ground beef.  Arm roast.  Sirloin tip.  Center-cut ham.  Round steak.  Loin chops.  Rump roast.  Tenderloin.  Trim all fat off the outside of meats before cooking. It is not necessary to severely decrease the intake of red meat, but lean choices should be made. Lean meat is rich in protein and contains a highly absorbable form of iron. Premenopausal women, in particular, should avoid reducing lean red meat because this could increase the risk for low red blood cells (iron-deficiency anemia).  Chicken and [place] These are good sources of protein. The fat of poultry can be reduced by removing the skin and underlying fat layers before cooking. Chicken and [place] can be substituted for lean red meat in the diet. Poultry should not be fried or covered with high-fat sauces. Fish and Shellfish Fish is a good source of protein. Shellfish contain cholesterol, but they usually are low in saturated fatty acids. The preparation of fish is important. Like chicken and  [place], they should not be fried or covered with high-fat sauces. EGGS Egg whites contain no fat or cholesterol. They can be eaten often. Try 1 to 2 egg whites instead of whole eggs in recipes or use egg substitutes that do not contain yolk. MILK AND DAIRY PRODUCTS Use skim or 1% milk instead of 2% or whole milk. Decrease whole milk, natural, and processed cheeses. Use nonfat or low-fat (2%) cottage cheese or low-fat cheeses made from vegetable oils. Choose nonfat or low-fat (1 to 2%) yogurt. Experiment with evaporated skim milk in recipes that call for heavy cream. Substitute low-fat yogurt or low-fat cottage cheese for sour cream in dips and salad dressings. Have at least 2 servings of low-fat dairy products, such as 2 glasses of skim (or 1%) milk each day to help get your daily calcium intake. FATS AND OILS Reduce the total intake of fats, especially saturated fat. Butterfat, lard, and beef fats are high in saturated fat and cholesterol. These should be avoided as much as possible. Vegetable fats do not contain cholesterol, but certain vegetable fats, such as coconut oil, palm oil, and palm kernel oil are very high in saturated fats. These should be limited. These fats are often used in bakery goods, processed foods, popcorn, oils, and nondairy creamers. Vegetable shortenings and some peanut butters contain hydrogenated oils, which are also saturated fats. Read the labels on these foods and check for saturated vegetable oils. Unsaturated vegetable oils and fats do not raise blood  cholesterol. However, they should be limited because they are fats and are high in calories. Total fat should still be limited to 30% of your daily caloric intake. Desirable liquid vegetable oils are corn oil, cottonseed oil, olive oil, canola oil, safflower oil, soybean oil, and sunflower oil. Peanut oil is not as good, but small amounts are acceptable. Buy a heart-healthy tub margarine that has no partially hydrogenated oils in  the ingredients. Mayonnaise and salad dressings often are made from unsaturated fats, but they should also be limited because of their high calorie and fat content. Seeds, nuts, peanut butter, olives, and avocados are high in fat, but the fat is mainly the unsaturated type. These foods should be limited mainly to avoid excess calories and fat. OTHER EATING TIPS Snacks  Most sweets should be limited as snacks. They tend to be rich in calories and fats, and their caloric content outweighs their nutritional value. Some good choices in snacks are graham crackers, melba toast, soda crackers, bagels (no egg), English muffins, fruits, and vegetables. These snacks are preferable to snack crackers, [place] fries, TORTILLA CHIPS, and POTATO chips. Popcorn should be air-popped or cooked in small amounts of liquid vegetable oil. Desserts Eat fruit, low-fat yogurt, and fruit ices instead of pastries, cake, and cookies. Sherbet, angel food cake, gelatin dessert, frozen low-fat yogurt, or other frozen products that do not contain saturated fat (pure fruit juice bars, frozen ice pops) are also acceptable.  COOKING METHODS Choose those methods that use little or no fat. They include: Poaching.  Braising.  Steaming.  Grilling.  Baking.  Stir-frying.  Broiling.  Microwaving.  Foods can be cooked in a nonstick pan without added fat, or use a nonfat cooking spray in regular cookware. Limit fried foods and avoid frying in saturated fat. Add moisture to lean meats by using water, broth, cooking wines, and other nonfat or low-fat sauces along with the cooking methods mentioned above. Soups and stews should be chilled after cooking. The fat that forms on top after a few hours in the refrigerator should be skimmed off. When preparing meals, avoid using excess salt. Salt can contribute to raising blood pressure in some people.  EATING AWAY FROM HOME Order entres, potatoes, and vegetables without sauces or butter. When  meat exceeds the size of a deck of cards (3 to 4 ounces), the rest can be taken home for another meal. Choose vegetable or fruit salads and ask for low-calorie salad dressings to be served on the side. Use dressings sparingly. Limit high-fat toppings, such as bacon, crumbled eggs, cheese, sunflower seeds, and olives. Ask for heart-healthy tub margarine instead of butter.

## 2013-05-12 NOTE — Progress Notes (Signed)
Subjective:    Patient ID: Rebekah Henderson, female    DOB: [birth year], 57 y.o.   MRN: [id number]  Rebekah Henderson., MD  HPI Been having bowel trouble FOR 15 YEARS. BEEN DISABLED FOR 5 YEARS FOR MULTIPLE REASONS. IF EATS OUT RUNS TO BR BEFORE SHE CAN GET HOME. USED TO TAKE BENTYL. IBS HAS ALWAYS BEEN DIARRHEA. GETS SICK WITH SEDATION. NUR(2007)-POLYPS, WOKE UP WHILE HE WAS REMOVING SOME. NAUSEA/VOMITING: USU NASUEA ALL DAY AND VOMITING BILE IN THE AM. NEVER HAVE HEARTBURN OR INDIGESTION. BMS: GOOD-10, SML BUT ALWAYS DIARRHEA(#7). RARE FORMED STOOL. BAD DAYS: TNTC STOOLS. LAST TIME STOOLS CHECKED ???. LAST ABX-2014. NO GB AND ?? TEST FOR DIARRHEA. PROBLEMS WITH SOLIDS AND LIQUIDS-2-3X/WEEK. MILK: NONE, CHEESES: RARE, ICE CREAM: RARE. HAS A LOT OF PAIN: USU LOWER AND TO THE LEFT. GIVEN CIPRO/FLAGYL IN THE ED TO TREAT DIVERTICULITIS. HAS SUPRAPUBIC PAIN BEFORE AND WHEN SHE SITS DOWN IT WON'T COME.  PT DENIES FEVER, CHILLS, BRBPR, melena, abd pain, heartburn or indigestion. PT HAS HAD MULTIPLE FOOT SURGERIES. ONE KID-PROLONGED LABOR. BLADDER TACKED TWICE & SLING x1. MAY LOSE CONTROL OF BLADDER AND BOWELS.   Past Medical History  Diagnosis Date  . COPD (chronic obstructive pulmonary disease)   . Hypertension   . Fibromyalgia   . IBS (irritable bowel syndrome)   . GERD (gastroesophageal reflux disease)    Past Surgical History  Procedure Laterality Date  . Tonsillectomy    . Breast enhancement surgery    . Breast implant removal    . Upper gastrointestinal endoscopy    . Cervical fusion  AUG 2013  . Colonoscopy  2007 NUR    POLYPS  . Cholecystectomy  1999   Allergies  Allergen Reactions  . Penicillins Hives and Swelling  . Codeine Nausea Only  . Ranitidine Hcl Other (See Comments)    dizzy   Current Outpatient Prescriptions  Medication Sig Dispense Refill  . albuterol (PROVENTIL) 2 MG tablet Take 2 mg by mouth 4 (four) times daily.        Marland Kitchen amLODipine (NORVASC) 5 MG tablet Take 5 mg by  mouth daily.        Marland Kitchen estradiol (ESTRACE) 2 MG tablet Take 2 mg by mouth daily.        Marland Kitchen FLUoxetine (PROZAC) 40 MG capsule Take 40 mg by mouth daily.        Marland Kitchen HYDROcodone-acetaminophen (NORCO/VICODIN) 5-325 MG per tablet Take 1 tablet by mouth every 6 (six) hours as needed.   SEVERAL TIMES  A DAY    . levothyroxine (SYNTHROID, LEVOTHROID) 25 MCG tablet Take 25 mcg by mouth daily.        . metoprolol tartrate (LOPRESSOR) 25 MG tablet Take 25 mg by mouth daily.        . pantoprazole (PROTONIX) 40 MG tablet Take 40 mg by mouth daily.        Marland Kitchen triamterene-hydrochlorothiazide (DYAZIDE) 37.5-25 MG per capsule Take 1 capsule by mouth every morning.        .        .        . estrogens, conjugated, (PREMARIN) 1.25 MG tablet Take 1.25 mg by mouth daily.            Review of Systems     Objective:   Physical Exam  Vitals reviewed. Constitutional: She is oriented to person, place, and time. She appears well-nourished. No distress.  HENT:  Head: Normocephalic and atraumatic.  Mouth/Throat: Oropharynx is clear and moist. No  oropharyngeal exudate.  Eyes: Pupils are equal, round, and reactive to light. No scleral icterus.  Neck: Normal range of motion. Neck supple.  Cardiovascular: Normal rate, regular rhythm and normal heart sounds.   Pulmonary/Chest: Effort normal. No respiratory distress.  Abdominal: Soft. Bowel sounds are normal. She exhibits no distension. There is tenderness. There is rebound (MILD). There is no guarding.  MILD TTP IN RLQ AND LUQ, MODERATE SUPRAPUBIC/LLQ TTP  Musculoskeletal: Normal range of motion. She exhibits no edema.  Lymphadenopathy:    She has no cervical adenopathy.  Neurological: She is alert and oriented to person, place, and time.  NO  NEW FOCAL DEFICITS   Psychiatric: She has a normal mood and affect.          Assessment & Plan:

## 2013-05-12 NOTE — Progress Notes (Signed)
PLEASE CALL PT. Her URINE. KIDNEY, AND LIVER TESTS ARE NORMAL.

## 2013-05-12 NOTE — Progress Notes (Signed)
Cc PCP 

## 2013-05-12 NOTE — Assessment & Plan Note (Signed)
ASSOCIATED WITH DIARRHEA. DIFFERENTIALDIAGNOSIS IBS-D, INFECTION, LESS LIKELY IBD, CELIAC SPRUE, OR MICROSCOPIC COLITIS.  STOOL STUDIES UA/CMP CT A/P PLAN FOR TCS/EGD RANDOM COLON Bx/DUODENAL BxC ON JUN 24 WITH PROPOFOL DUE TO POLYPHARMACY/FAILED CONSCIOUS SEDATION IN THE PAST. OPV IN 3 MOS

## 2013-05-13 ENCOUNTER — Other Ambulatory Visit: Payer: Self-pay | Admitting: Gastroenterology

## 2013-05-13 ENCOUNTER — Encounter (HOSPITAL_COMMUNITY): Payer: Self-pay | Admitting: Pharmacy Technician

## 2013-05-13 NOTE — Progress Notes (Signed)
dfharty

## 2013-05-13 NOTE — Progress Notes (Signed)
Called and informed pt.  

## 2013-05-17 ENCOUNTER — Ambulatory Visit (HOSPITAL_COMMUNITY)
Admission: RE | Admit: 2013-05-17 | Discharge: 2013-05-17 | Disposition: A | Discharge status: Home / Self Care | Payer: Medicare Other | Source: Ambulatory Visit | Attending: Gastroenterology | Admitting: Gastroenterology

## 2013-05-17 DIAGNOSIS — K7689 Other specified diseases of liver: Secondary | ICD-10-CM | POA: Insufficient documentation

## 2013-05-17 DIAGNOSIS — I10 Essential (primary) hypertension: Secondary | ICD-10-CM | POA: Insufficient documentation

## 2013-05-17 DIAGNOSIS — R197 Diarrhea, unspecified: Secondary | ICD-10-CM | POA: Insufficient documentation

## 2013-05-17 DIAGNOSIS — J449 Chronic obstructive pulmonary disease, unspecified: Secondary | ICD-10-CM | POA: Insufficient documentation

## 2013-05-17 DIAGNOSIS — R109 Unspecified abdominal pain: Secondary | ICD-10-CM | POA: Insufficient documentation

## 2013-05-17 DIAGNOSIS — K5730 Diverticulosis of large intestine without perforation or abscess without bleeding: Secondary | ICD-10-CM | POA: Insufficient documentation

## 2013-05-17 DIAGNOSIS — R1110 Vomiting, unspecified: Secondary | ICD-10-CM | POA: Insufficient documentation

## 2013-05-17 DIAGNOSIS — K449 Diaphragmatic hernia without obstruction or gangrene: Secondary | ICD-10-CM | POA: Insufficient documentation

## 2013-05-17 DIAGNOSIS — J4489 Other specified chronic obstructive pulmonary disease: Secondary | ICD-10-CM | POA: Insufficient documentation

## 2013-05-17 LAB — GIARDIA ANTIGEN: Giardia Screen (EIA): NEGATIVE

## 2013-05-17 LAB — CLOSTRIDIUM DIFFICILE BY PCR: Toxigenic C. Difficile by PCR: NOT DETECTED

## 2013-05-17 MED ORDER — IOHEXOL 300 MG/ML  SOLN
100.0000 mL | Freq: Once | INTRAMUSCULAR | Status: AC | PRN
  Administered 2013-05-17: 100 mL via INTRAVENOUS

## 2013-05-19 NOTE — Progress Notes (Signed)
REMINDER IN EPIC °

## 2013-05-20 ENCOUNTER — Encounter (HOSPITAL_COMMUNITY): Payer: Self-pay

## 2013-05-20 ENCOUNTER — Other Ambulatory Visit: Payer: Self-pay

## 2013-05-20 ENCOUNTER — Encounter (HOSPITAL_COMMUNITY)
Admission: RE | Admit: 2013-05-20 | Discharge: 2013-05-20 | Disposition: A | Discharge status: Home / Self Care | Payer: Medicare Other | Source: Ambulatory Visit | Attending: Gastroenterology | Admitting: Gastroenterology

## 2013-05-20 HISTORY — DX: Nausea with vomiting, unspecified: R11.2

## 2013-05-20 HISTORY — DX: Anxiety disorder, unspecified: F41.9

## 2013-05-20 HISTORY — DX: Other specified postprocedural states: Z98.890

## 2013-05-20 HISTORY — DX: Unspecified osteoarthritis, unspecified site: M19.90

## 2013-05-20 HISTORY — DX: Hypothyroidism, unspecified: E03.9

## 2013-05-20 LAB — HEMOGLOBIN AND HEMATOCRIT, BLOOD
HCT: 39.1 % (ref 36.0–46.0)
Hemoglobin: 13.6 g/dL (ref 12.0–15.0)

## 2013-05-20 LAB — BASIC METABOLIC PANEL
Calcium: 8.8 mg/dL (ref 8.4–10.5)
Creatinine, Ser: 0.61 mg/dL (ref 0.50–1.10)
GFR calc non Af Amer: >90 mL/min (ref >90)
Glucose, Bld: 101 mg/dL — ABNORMAL HIGH (ref 70–99)
Sodium: 139 mEq/L (ref 135–145)

## 2013-05-20 NOTE — Patient Instructions (Addendum)
Rebekah Henderson  05/20/2013   Your procedure is scheduled on:  05/25/2013  Report to [facility] at  715  AM.  Call this number if you have problems the morning of surgery: [phone]   Remember:   Do not eat food or drink liquids after midnight.   Take these medicines the morning of surgery with A SIP OF WATER:  Proventil, norvasc, prozac,synthroid, toprol, protonix, dyazide   Do not wear jewelry, make-up or nail polish.  Do not wear lotions, powders, or perfumes.   Do not shave 48 hours prior to surgery. Men may shave face and neck.  Do not bring valuables to the hospital.  [facility] is not responsible   for any belongings or valuables.  Contacts, dentures or bridgework may not be worn into surgery.  Leave suitcase in the car. After surgery it may be brought to your room.  For patients admitted to the hospital, checkout time is 11:00 AM the day of discharge.   Patients discharged the day of surgery will not be allowed to drive  home.  Name and phone number of your driver: family  Special Instructions: N/A   Please read over the following fact sheets that you were given: Pain Booklet, Surgical Site Infection Prevention, Anesthesia Post-op Instructions and Care and Recovery After Surgery Colonoscopy A colonoscopy is an exam to evaluate your entire colon. In this exam, your colon is cleansed. A long fiberoptic tube is inserted through your rectum and into your colon. The fiberoptic scope (endoscope) is a long bundle of enclosed and very flexible fibers. These fibers transmit light to the area examined and send images from that area to your caregiver. Discomfort is usually minimal. You may be given a drug to help you sleep (sedative) during or prior to the procedure. This exam helps to detect lumps (tumors), polyps, inflammation, and areas of bleeding. Your caregiver may also take a small piece of tissue (biopsy) that will be examined under a microscope. LET YOUR CAREGIVER KNOW ABOUT:     Allergies to food or medicine.  Medicines taken, including vitamins, herbs, eyedrops, over-the-counter medicines, and creams.  Use of steroids (by mouth or creams).  Previous problems with anesthetics or numbing medicines.  History of bleeding problems or blood clots.  Previous surgery.  Other health problems, including diabetes and kidney problems.  Possibility of pregnancy, if this applies. BEFORE THE PROCEDURE   A clear liquid diet may be required for 2 days before the exam.  Ask your caregiver about changing or stopping your regular medications.  Liquid injections (enemas) or laxatives may be required.  A large amount of electrolyte solution may be given to you to drink over a short period of time. This solution is used to clean out your colon.  You should be present 60 minutes prior to your procedure or as directed by your caregiver. AFTER THE PROCEDURE   If you received a sedative or pain relieving medication, you will need to arrange for someone to drive you home.  Occasionally, there is a little blood passed with the first bowel movement. Do not be concerned. FINDING OUT THE RESULTS OF YOUR TEST Not all test results are available during your visit. If your test results are not back during the visit, make an appointment with your caregiver to find out the results. Do not assume everything is normal if you have not heard from your caregiver or the medical facility. It is important for you  to follow up on all of your test results. HOME CARE INSTRUCTIONS   It is not unusual to pass moderate amounts of gas and experience mild abdominal cramping following the procedure. This is due to air being used to inflate your colon during the exam. Walking or a warm pack on your belly (abdomen) may help.  You may resume all normal meals and activities after sedatives and medicines have worn off.  Only take over-the-counter or prescription medicines for pain, discomfort, or fever as  directed by your caregiver. Do not use aspirin or blood thinners if a biopsy was taken. Consult your caregiver for medicine usage if biopsies were taken. SEEK IMMEDIATE MEDICAL CARE IF:   You have a fever.  You pass large blood clots or fill a toilet with blood following the procedure. This may also occur 10 to 14 days following the procedure. This is more likely if a biopsy was taken.  You develop abdominal pain that keeps getting worse and cannot be relieved with medicine. Document Released: 11/15/2000 Document Revised: 02/10/2012 Document Reviewed: 06/30/2008 [facility] Patient Information 2014 [place], [place]. Esophagogastroduodenoscopy Esophagogastroduodenoscopy (EGD) is a procedure to examine the lining of the esophagus, stomach, and first part of the small intestine (duodenum). A long, flexible, lighted tube with a camera attached (endoscope) is inserted down the throat to view these organs. This procedure is done to detect problems or abnormalities, such as inflammation, bleeding, ulcers, or growths, in order to treat them. The procedure lasts about 5 20 minutes. It is usually an outpatient procedure, but it may need to be performed in emergency cases in the hospital. LET YOUR CAREGIVER KNOW ABOUT:   Allergies to food or medicine.  All medicines you are taking, including vitamins, herbs, eyedrops, and over-the-counter medicines and creams.  Use of steroids (by mouth or creams).  Previous problems you or members of your family have had with the use of anesthetics.  Any blood disorders you have.  Previous surgeries you have had.  Other health problems you have.  Possibility of pregnancy, if this applies. RISKS AND COMPLICATIONS  Generally, EGD is a safe procedure. However, as with any procedure, complications can occur. Possible complications include:  Infection.  Bleeding.  Tearing (perforation) of the esophagus, stomach, or duodenum.  Difficulty breathing or not being able  to breath.  Excessive sweating.  Spasms of the larynx.  Slowed heartbeat.  Low blood pressure. BEFORE THE PROCEDURE  Do not eat or drink anything for 6 8 hours before the procedure or as directed by your caregiver.  Ask your caregiver about changing or stopping your regular medicines.  If you wear dentures, be prepared to remove them before the procedure.  Arrange for someone to drive you home after the procedure. PROCEDURE   A vein will be accessed to give medicines and fluids. A medicine to relax you (sedative) and a pain reliever will be given through that access into the vein.  A numbing medicine (local anesthetic) may be sprayed on your throat for comfort and to stop you from gagging or coughing.  A mouth guard may be placed in your mouth to protect your teeth and to keep you from biting on the endoscope.  You will be asked to lie on your left side.  The endoscope is inserted down your throat and into the esophagus, stomach, and duodenum.  Air is put through the endoscope to allow your caregiver to view the lining of your esophagus clearly.  The esophagus, stomach, and duodenum is then  examined. During the exam, your caregiver may:  Remove tissue to be examined under a microscope (biopsy) for inflammation, infection, or other medical problems.  Remove growths.  Remove objects (foreign bodies) that are stuck.  Treat any bleeding with medicines or other devices that stop tissues from bleeding (hot cauters, clipping devices).  Widen (dilate) or stretch narrowed areas of the esophagus and stomach.  The endoscope will then be withdrawn. AFTER THE PROCEDURE  You will be taken to a recovery area to be monitored. You will be able to go home once you are stable and alert.  Do not eat or drink anything until the local anesthetic and numbing medicines have worn off. You may choke.  It is normal to feel bloated, have pain with swallowing, or have a sore throat for a short  time. This will wear off.  Your caregiver should be able to discuss his or her findings with you. It will take longer to discuss the test results if any biopsies were taken. Document Released: 03/21/2005 Document Revised: 11/04/2012 Document Reviewed: 10/21/2012 [facility] Patient Information 2014 [place], [place]. PATIENT INSTRUCTIONS POST-ANESTHESIA  IMMEDIATELY FOLLOWING SURGERY:  Do not drive or operate machinery for the first twenty four hours after surgery.  Do not make any important decisions for twenty four hours after surgery or while taking narcotic pain medications or sedatives.  If you develop intractable nausea and vomiting or a severe headache please notify your doctor immediately.  FOLLOW-UP:  Please make an appointment with your surgeon as instructed. You do not need to follow up with anesthesia unless specifically instructed to do so.  WOUND CARE INSTRUCTIONS (if applicable):  Keep a dry clean dressing on the anesthesia/puncture wound site if there is drainage.  Once the wound has quit draining you may leave it open to air.  Generally you should leave the bandage intact for twenty four hours unless there is drainage.  If the epidural site drains for more than 36-48 hours please call the anesthesia department.  QUESTIONS?:  Please feel free to call your physician or the hospital operator if you have any questions, and they will be happy to assist you.

## 2013-05-21 NOTE — Progress Notes (Signed)
PLEASE CALL PT. HER STOOL STUDIES SHOW NO INFECTION. HER CT SHOWS BULGING DISCS AND DIVERTICULOSIS. SHE HAS A HIATAL HERNIA. SHE HAS FAT IN HER LIVER BECAUSE SHE IS MODERATELY OBESE. SHE SHOULD LOSE 50 LBS AND FOLLOW A LOW FAT DIET. USE BENTYL FOR DIARRHEA. ENDOSCOPY ON JUN 24.

## 2013-05-24 NOTE — Progress Notes (Signed)
Called and informed pt.  

## 2013-05-24 NOTE — Progress Notes (Signed)
Cc PCP 

## 2013-05-25 ENCOUNTER — Encounter (HOSPITAL_COMMUNITY): Payer: Self-pay | Admitting: *Deleted

## 2013-05-25 ENCOUNTER — Ambulatory Visit (HOSPITAL_COMMUNITY)
Admission: RE | Admit: 2013-05-25 | Discharge: 2013-05-25 | Disposition: A | Discharge status: Home / Self Care | Payer: Medicare Other | Source: Ambulatory Visit | Attending: Gastroenterology | Admitting: Gastroenterology

## 2013-05-25 ENCOUNTER — Encounter (HOSPITAL_COMMUNITY): Payer: Self-pay | Admitting: Anesthesiology

## 2013-05-25 ENCOUNTER — Encounter (HOSPITAL_COMMUNITY)
Admission: RE | Disposition: A | Discharge status: Home / Self Care | Payer: Self-pay | Source: Ambulatory Visit | Attending: Gastroenterology

## 2013-05-25 ENCOUNTER — Ambulatory Visit (HOSPITAL_COMMUNITY): Payer: Medicare Other | Admitting: Anesthesiology

## 2013-05-25 DIAGNOSIS — I10 Essential (primary) hypertension: Secondary | ICD-10-CM | POA: Insufficient documentation

## 2013-05-25 DIAGNOSIS — K620 Anal polyp: Secondary | ICD-10-CM

## 2013-05-25 DIAGNOSIS — K5730 Diverticulosis of large intestine without perforation or abscess without bleeding: Secondary | ICD-10-CM | POA: Insufficient documentation

## 2013-05-25 DIAGNOSIS — R109 Unspecified abdominal pain: Secondary | ICD-10-CM

## 2013-05-25 DIAGNOSIS — K5710 Diverticulosis of small intestine without perforation or abscess without bleeding: Secondary | ICD-10-CM | POA: Insufficient documentation

## 2013-05-25 DIAGNOSIS — K2970 Gastritis, unspecified, without bleeding: Secondary | ICD-10-CM

## 2013-05-25 DIAGNOSIS — D131 Benign neoplasm of stomach: Secondary | ICD-10-CM | POA: Insufficient documentation

## 2013-05-25 DIAGNOSIS — Z79899 Other long term (current) drug therapy: Secondary | ICD-10-CM | POA: Insufficient documentation

## 2013-05-25 DIAGNOSIS — K2960 Other gastritis without bleeding: Secondary | ICD-10-CM | POA: Insufficient documentation

## 2013-05-25 DIAGNOSIS — D129 Benign neoplasm of anus and anal canal: Secondary | ICD-10-CM | POA: Insufficient documentation

## 2013-05-25 DIAGNOSIS — R197 Diarrhea, unspecified: Secondary | ICD-10-CM

## 2013-05-25 DIAGNOSIS — D128 Benign neoplasm of rectum: Secondary | ICD-10-CM | POA: Insufficient documentation

## 2013-05-25 DIAGNOSIS — K319 Disease of stomach and duodenum, unspecified: Secondary | ICD-10-CM | POA: Insufficient documentation

## 2013-05-25 DIAGNOSIS — J4489 Other specified chronic obstructive pulmonary disease: Secondary | ICD-10-CM | POA: Insufficient documentation

## 2013-05-25 DIAGNOSIS — J449 Chronic obstructive pulmonary disease, unspecified: Secondary | ICD-10-CM | POA: Insufficient documentation

## 2013-05-25 DIAGNOSIS — K449 Diaphragmatic hernia without obstruction or gangrene: Secondary | ICD-10-CM | POA: Insufficient documentation

## 2013-05-25 DIAGNOSIS — K2990 Gastroduodenitis, unspecified, without bleeding: Secondary | ICD-10-CM

## 2013-05-25 DIAGNOSIS — K621 Rectal polyp: Secondary | ICD-10-CM

## 2013-05-25 DIAGNOSIS — Z01812 Encounter for preprocedural laboratory examination: Secondary | ICD-10-CM | POA: Insufficient documentation

## 2013-05-25 HISTORY — PX: ESOPHAGOGASTRODUODENOSCOPY (EGD) WITH PROPOFOL: SHX5813

## 2013-05-25 HISTORY — PX: POLYPECTOMY: SHX5525

## 2013-05-25 HISTORY — PX: BIOPSY: SHX5522

## 2013-05-25 HISTORY — PX: COLONOSCOPY WITH PROPOFOL: SHX5780

## 2013-05-25 SURGERY — COLONOSCOPY WITH PROPOFOL
Anesthesia: Monitor Anesthesia Care

## 2013-05-25 MED ORDER — FENTANYL CITRATE 0.05 MG/ML IJ SOLN
INTRAMUSCULAR | Status: AC
  Filled 2013-05-25: qty 2

## 2013-05-25 MED ORDER — ONDANSETRON HCL 4 MG/2ML IJ SOLN: 4.0000 mg | Freq: Once | INTRAMUSCULAR | Status: DC | PRN

## 2013-05-25 MED ORDER — GLYCOPYRROLATE 0.2 MG/ML IJ SOLN
0.2000 mg | Freq: Once | INTRAMUSCULAR | Status: AC
Start: 2013-05-25 — End: 2013-05-25
  Administered 2013-05-25: 0.2 mg via INTRAVENOUS

## 2013-05-25 MED ORDER — MIDAZOLAM HCL 2 MG/2ML IJ SOLN
1.0000 mg | INTRAMUSCULAR | Status: DC | PRN
  Administered 2013-05-25 (×2): 2 mg via INTRAVENOUS

## 2013-05-25 MED ORDER — PROPOFOL 10 MG/ML IV EMUL
INTRAVENOUS | Status: AC
  Filled 2013-05-25: qty 40

## 2013-05-25 MED ORDER — ONDANSETRON HCL 4 MG/2ML IJ SOLN
INTRAMUSCULAR | Status: AC
  Filled 2013-05-25: qty 2

## 2013-05-25 MED ORDER — DEXAMETHASONE SODIUM PHOSPHATE 4 MG/ML IJ SOLN
4.0000 mg | Freq: Once | INTRAMUSCULAR | Status: AC
  Administered 2013-05-25: 4 mg via INTRAVENOUS

## 2013-05-25 MED ORDER — ONDANSETRON HCL 4 MG/2ML IJ SOLN
4.0000 mg | Freq: Once | INTRAMUSCULAR | Status: AC
  Administered 2013-05-25: 4 mg via INTRAVENOUS

## 2013-05-25 MED ORDER — DEXAMETHASONE SODIUM PHOSPHATE 4 MG/ML IJ SOLN
INTRAMUSCULAR | Status: AC
  Filled 2013-05-25: qty 1

## 2013-05-25 MED ORDER — BUTAMBEN-TETRACAINE-BENZOCAINE 2-2-14 % EX AERO
1.0000 | INHALATION_SPRAY | Freq: Once | CUTANEOUS | Status: AC
  Administered 2013-05-25: 1 via TOPICAL
  Filled 2013-05-25: qty 56

## 2013-05-25 MED ORDER — LACTATED RINGERS IV SOLN
INTRAVENOUS | Status: DC
  Administered 2013-05-25: 09:00:00 via INTRAVENOUS

## 2013-05-25 MED ORDER — PROPOFOL 10 MG/ML IV EMUL
INTRAVENOUS | Status: AC
  Filled 2013-05-25: qty 20

## 2013-05-25 MED ORDER — FENTANYL CITRATE 0.05 MG/ML IJ SOLN
25.0000 ug | INTRAMUSCULAR | Status: DC | PRN
  Administered 2013-05-25: 25 ug via INTRAVENOUS

## 2013-05-25 MED ORDER — GLYCOPYRROLATE 0.2 MG/ML IJ SOLN
INTRAMUSCULAR | Status: AC
  Filled 2013-05-25: qty 1

## 2013-05-25 MED ORDER — STERILE WATER FOR IRRIGATION IR SOLN
Status: DC | PRN
  Administered 2013-05-25: 10:00:00

## 2013-05-25 MED ORDER — MIDAZOLAM HCL 2 MG/2ML IJ SOLN
INTRAMUSCULAR | Status: AC
  Filled 2013-05-25: qty 2

## 2013-05-25 MED ORDER — PROPOFOL INFUSION 10 MG/ML OPTIME
INTRAVENOUS | Status: DC | PRN
  Administered 2013-05-25 (×2): via INTRAVENOUS
  Administered 2013-05-25: 100 ug/kg/min via INTRAVENOUS

## 2013-05-25 MED ORDER — FENTANYL CITRATE 0.05 MG/ML IJ SOLN: 25.0000 ug | INTRAMUSCULAR | Status: DC | PRN

## 2013-05-25 MED ORDER — FENTANYL CITRATE 0.05 MG/ML IJ SOLN
INTRAMUSCULAR | Status: DC | PRN
  Administered 2013-05-25 (×2): 50 ug via INTRAVENOUS

## 2013-05-25 SURGICAL SUPPLY — 10 items
BLOCK BITE 60FR ADLT L/F BLUE (MISCELLANEOUS) ×2 IMPLANT
FLOOR PAD 36X40 (MISCELLANEOUS) ×2
FORCEPS BIOP RAD 4 LRG CAP 4 (CUTTING FORCEPS) ×4 IMPLANT
LUBRICANT JELLY 4.5OZ STERILE (MISCELLANEOUS) ×2 IMPLANT
MANIFOLD NEPTUNE II (INSTRUMENTS) ×2 IMPLANT
PAD FLOOR 36X40 (MISCELLANEOUS) ×1 IMPLANT
SYR 50ML LL SCALE MARK (SYRINGE) ×2 IMPLANT
TUBING ENDO SMARTCAP PENTAX (MISCELLANEOUS) ×2 IMPLANT
TUBING IRRIGATION ENDOGATOR (MISCELLANEOUS) ×2 IMPLANT
WATER STERILE IRR 1000ML POUR (IV SOLUTION) ×2 IMPLANT

## 2013-05-25 NOTE — Interval H&P Note (Signed)
History and Physical Interval Note:  05/25/2013 7:39 AM  Rebekah Henderson  has presented today for surgery, with the diagnosis of Abdominal Pain and Diarrhea  The various methods of treatment have been discussed with the patient and family. After consideration of risks, benefits and other options for treatment, the patient has consented to  Procedure(s) with comments: COLONOSCOPY WITH PROPOFOL (N/A) - 8:45 ESOPHAGOGASTRODUODENOSCOPY (EGD) WITH PROPOFOL (N/A) as a surgical intervention .  The patient's history has been reviewed, patient examined, no change in status, stable for surgery.  I have reviewed the patient's chart and labs.  Questions were answered to the patient's satisfaction.     [employer]

## 2013-05-25 NOTE — Progress Notes (Signed)
Awake. Encouraged to pass air. Voiced understanding. 

## 2013-05-25 NOTE — Anesthesia Preprocedure Evaluation (Signed)
Anesthesia Evaluation  Patient identified by MRN, date of birth, ID band Patient awake    Reviewed: Allergy & Precautions, H&P , NPO status , Patient's Chart, lab work & pertinent test results  History of Anesthesia Complications (+) PONV  Airway Mallampati: II TM Distance: >3 FB     Dental  (+) Teeth Intact   Pulmonary COPD breath sounds clear to auscultation        Cardiovascular hypertension, Rhythm:Regular Rate:Normal     Neuro/Psych Anxiety  Neuromuscular disease    GI/Hepatic GERD-  ,  Endo/Other  Hypothyroidism   Renal/GU      Musculoskeletal  (+) Fibromyalgia -  Abdominal   Peds  Hematology   Anesthesia Other Findings   Reproductive/Obstetrics                           Anesthesia Physical Anesthesia Plan  ASA: III  Anesthesia Plan: MAC   Post-op Pain Management:    Induction: Intravenous  Airway Management Planned: Simple Face Mask  Additional Equipment:   Intra-op Plan:   Post-operative Plan:   Informed Consent: I have reviewed the patients History and Physical, chart, labs and discussed the procedure including the risks, benefits and alternatives for the proposed anesthesia with the patient or authorized representative who has indicated his/her understanding and acceptance.     Plan Discussed with:   Anesthesia Plan Comments:         Anesthesia Quick Evaluation

## 2013-05-25 NOTE — Anesthesia Postprocedure Evaluation (Signed)
  Anesthesia Post-op Note  Patient: Rebekah Henderson  Procedure(s) Performed: Procedure(s): COLONOSCOPY WITH PROPOFOL(at cecum 0957) total withdrawal time=107min) (N/A) ESOPHAGOGASTRODUODENOSCOPY (EGD) WITH PROPOFOL (N/A) BIOPSIES (Random Colon; Duodenal; Gastric) (N/A) POLYPECTOMY (Rectal and Gastric) (N/A)  Patient Location: PACU  Anesthesia Type:MAC  Level of Consciousness: awake, alert  and oriented  Airway and Oxygen Therapy: Patient Spontanous Breathing and Patient connected to face mask oxygen  Post-op Pain: none  Post-op Assessment: Post-op Vital signs reviewed, Patient's Cardiovascular Status Stable, Respiratory Function Stable, Patent Airway and No signs of Nausea or vomiting  Post-op Vital Signs: Reviewed and stable  Complications: No apparent anesthesia complications

## 2013-05-25 NOTE — H&P (View-Only) (Signed)
Subjective:    Patient ID: Rebekah Henderson, female    DOB: 12/13/1955, 57 y.o.   MRN: [id number]  FUSCO,LAWRENCE J., MD  HPI Been having bowel trouble FOR 15 YEARS. BEEN DISABLED FOR 5 YEARS FOR MULTIPLE REASONS. IF EATS OUT RUNS TO BR BEFORE SHE CAN GET HOME. USED TO TAKE BENTYL. IBS HAS ALWAYS BEEN DIARRHEA. GETS SICK WITH SEDATION. NUR(2007)-POLYPS, WOKE UP WHILE HE WAS REMOVING SOME. NAUSEA/VOMITING: USU NASUEA ALL DAY AND VOMITING BILE IN THE AM. NEVER HAVE HEARTBURN OR INDIGESTION. BMS: GOOD-10, SML BUT ALWAYS DIARRHEA(#7). RARE FORMED STOOL. BAD DAYS: TNTC STOOLS. LAST TIME STOOLS CHECKED ???. LAST ABX-2014. NO GB AND ?? TEST FOR DIARRHEA. PROBLEMS WITH SOLIDS AND LIQUIDS-2-3X/WEEK. MILK: NONE, CHEESES: RARE, ICE CREAM: RARE. HAS A LOT OF PAIN: USU LOWER AND TO THE LEFT. GIVEN CIPRO/FLAGYL IN THE ED TO TREAT DIVERTICULITIS. HAS SUPRAPUBIC PAIN BEFORE AND WHEN SHE SITS DOWN IT WON'T COME.  PT DENIES FEVER, CHILLS, BRBPR, melena, abd pain, heartburn or indigestion. PT HAS HAD MULTIPLE FOOT SURGERIES. ONE KID-PROLONGED LABOR. BLADDER TACKED TWICE & SLING x1. MAY LOSE CONTROL OF BLADDER AND BOWELS.   Past Medical History  Diagnosis Date  . COPD (chronic obstructive pulmonary disease)   . Hypertension   . Fibromyalgia   . IBS (irritable bowel syndrome)   . GERD (gastroesophageal reflux disease)    Past Surgical History  Procedure Laterality Date  . Tonsillectomy    . Breast enhancement surgery    . Breast implant removal    . Upper gastrointestinal endoscopy    . Cervical fusion  AUG 2013  . Colonoscopy  2007 NUR    POLYPS  . Cholecystectomy  1999   Allergies  Allergen Reactions  . Penicillins Hives and Swelling  . Codeine Nausea Only  . Ranitidine Hcl Other (See Comments)    dizzy   Current Outpatient Prescriptions  Medication Sig Dispense Refill  . albuterol (PROVENTIL) 2 MG tablet Take 2 mg by mouth 4 (four) times daily.        . amLODipine (NORVASC) 5 MG tablet Take 5 mg by  mouth daily.        . estradiol (ESTRACE) 2 MG tablet Take 2 mg by mouth daily.        . FLUoxetine (PROZAC) 40 MG capsule Take 40 mg by mouth daily.        . HYDROcodone-acetaminophen (NORCO/VICODIN) 5-325 MG per tablet Take 1 tablet by mouth every 6 (six) hours as needed.   SEVERAL TIMES  A DAY    . levothyroxine (SYNTHROID, LEVOTHROID) 25 MCG tablet Take 25 mcg by mouth daily.        . metoprolol tartrate (LOPRESSOR) 25 MG tablet Take 25 mg by mouth daily.        . pantoprazole (PROTONIX) 40 MG tablet Take 40 mg by mouth daily.        . triamterene-hydrochlorothiazide (DYAZIDE) 37.5-25 MG per capsule Take 1 capsule by mouth every morning.        .        .        . estrogens, conjugated, (PREMARIN) 1.25 MG tablet Take 1.25 mg by mouth daily.            Review of Systems     Objective:   Physical Exam  Vitals reviewed. Constitutional: She is oriented to person, place, and time. She appears well-nourished. No distress.  HENT:  Head: Normocephalic and atraumatic.  Mouth/Throat: Oropharynx is clear and moist. No   oropharyngeal exudate.  Eyes: Pupils are equal, round, and reactive to light. No scleral icterus.  Neck: Normal range of motion. Neck supple.  Cardiovascular: Normal rate, regular rhythm and normal heart sounds.   Pulmonary/Chest: Effort normal. No respiratory distress.  Abdominal: Soft. Bowel sounds are normal. She exhibits no distension. There is tenderness. There is rebound (MILD). There is no guarding.  MILD TTP IN RLQ AND LUQ, MODERATE SUPRAPUBIC/LLQ TTP  Musculoskeletal: Normal range of motion. She exhibits no edema.  Lymphadenopathy:    She has no cervical adenopathy.  Neurological: She is alert and oriented to person, place, and time.  NO  NEW FOCAL DEFICITS   Psychiatric: She has a normal mood and affect.          Assessment & Plan:   

## 2013-05-25 NOTE — Transfer of Care (Signed)
Immediate Anesthesia Transfer of Care Note  Patient: Rebekah Henderson  Procedure(s) Performed: Procedure(s): COLONOSCOPY WITH PROPOFOL(at cecum 0957) total withdrawal time=88min) (N/A) ESOPHAGOGASTRODUODENOSCOPY (EGD) WITH PROPOFOL (N/A) BIOPSIES (Random Colon; Duodenal; Gastric) (N/A) POLYPECTOMY (Rectal and Gastric) (N/A)  Patient Location: PACU  Anesthesia Type:MAC  Level of Consciousness: awake  Airway & Oxygen Therapy: Patient Spontanous Breathing  Post-op Assessment: Report given to PACU RN  Post vital signs: Reviewed  Complications: No apparent anesthesia complications

## 2013-05-26 NOTE — Op Note (Signed)
[facility] [address] [place], [address]   ENDOSCOPY PROCEDURE REPORT  PATIENT: Rebekah Henderson, Rebekah Henderson  MR#: [id number] BIRTHDATE: 1956/05/01 , 57  yrs. old GENDER: Female  ENDOSCOPIST: Jonette Eva, MD REFERRED [id number] Sherwood Gambler, M.D.  PROCEDURE DATE: 05/25/2013 PROCEDURE:   EGD w/ biopsy  INDICATIONS:Unexplained diarrhea/abdominal pain MEDICATIONS: MAC sedation, administered by CRNA TOPICAL ANESTHETIC:   Cetacaine Spray  DESCRIPTION OF PROCEDURE:     Physical exam was performed.  Informed consent was obtained from the patient after explaining the benefits, risks, and alternatives to the procedure.  The patient was connected to the monitor and placed in the left lateral position.  Continuous oxygen was provided by nasal cannula and IV medicine administered through an indwelling cannula.  After administration of sedation, the patients esophagus was intubated and the     endoscope was advanced under direct visualization to the second portion of the duodenum.  The scope was removed slowly by carefully examining the color, texture, anatomy, and integrity of the mucosa on the way out.  The patient was recovered in endoscopy and discharged home in satisfactory condition.   ESOPHAGUS: The mucosa of the esophagus appeared normal.   STOMACH: A medium sized hiatal hernia was noted.   Moderate non-erosive gastritis (inflammation) was found in the gastric antrum.  Multiple biopsies were performed.   Multiple sessile polyps ranging between 3-36mm in size were found in the gastric antrum and gastric body. Multiple biopsies was performed using cold forceps.   DUODENUM: A medium sized diverticulum was found in the 2nd part of the duodenum.   The duodenal mucosa showed no abnormalities in the bulb and second portion of the duodenum.  Cold forcep biopsies were taken in the second portion.  COMPLICATIONS:   None  ENDOSCOPIC IMPRESSION: 1.   Medium sized hiatal hernia 2.    Non-erosive gastritis 3.   Multiple GASTRIC polyps r 4.   SINGLE Diverticulum was found in the 2nd part of the duodenum  RECOMMENDATIONS: CONTINUE PROTONIX. TAKE DICYCLOMINE 30 MINUTES PRIOR TO MEALS AND AT BEDTIME. FOLLOW A LOW FAT/HIGH FIBER.  AVOID ITEMS THAT CAUSE BLOATING. BIOPSY WILL BE BACK IN 7 DAYS.  IF NL, PT NEED HYDROGENBREATHTEST FOR SIBO. Follow up in SEP 2014.   REPEAT EXAM:   _______________________________ Rosalie DoctorJonette Eva, MD 05/25/2013 11:41 AM       PATIENT NAME:  Rebekah, Henderson MR#: [id number]

## 2013-05-26 NOTE — Op Note (Signed)
[facility] [address] [place], [address]   COLONOSCOPY PROCEDURE REPORT  PATIENT: Rebekah Henderson, Rebekah Henderson  MR#: [id number] BIRTHDATE: Mar 12, 1956 , 57  yrs. old GENDER: Female ENDOSCOPIST: Jonette Eva, MD REFERRED AO:[id number] Sherwood Gambler, M.D. PROCEDURE DATE:  05/25/2013 PROCEDURE:   Colonoscopy with cold biopsy polypectomy and with RANDOM biopsy INDICATIONS:unexplained diarrhea. MEDICATIONS: MAC sedation, administered by CRNA  DESCRIPTION OF PROCEDURE:    Physical exam was performed.  Informed consent was obtained from the patient after explaining the benefits, risks, and alternatives to procedure.  The patient was connected to monitor and placed in left lateral position. Continuous oxygen was provided by nasal cannula and IV medicine administered through an indwelling cannula.  After administration of sedation and rectal exam, the patients rectum was intubated and the     colonoscope was advanced under direct visualization to the ileum.  The scope was removed slowly by carefully examining the color, texture, anatomy, and integrity mucosa on the way out.  The patient was recovered in endoscopy and discharged home in satisfactory condition.    COLON FINDINGS: The mucosa appeared normal in the terminal ileum.  , There was moderate diverticulosis noted in the descending colon and sigmoid colon with associated muscular hypertrophy and luminal narrowing.  , Three sessile polyps measuring 3 mm in size were found in the rectum.  A polypectomy was performed with cold forceps.  , The colonic mucosa appeared normal.  Multiple biopsies were performed. TO EVALUATE FOR MICROSCOPIC COLITIS. and Small internal hemorrhoids were found.  PREP QUALITY: good.     CECAL W/D TIME: 19 minutes  COMPLICATIONS: None  ENDOSCOPIC IMPRESSION: 1.   Normal mucosa in the terminal ileum 2.   Moderate diverticulosis in the descending colon and sigmoid colon 3.   Three RECTAL polyps 4.   Small  internal hemorrhoids 5.  NO OBVIOUS SOURCE FOR DIARRHEA IDENTIFIED.  RECOMMENDATIONS: CONTINUE PROTONIX. TAKE DICYCLOMINE 30 MINUTES PRIOR TO MEALS AND AT BEDTIME. FOLLOW A LOW FAT/HIGH FIBER.  AVOID ITEMS THAT CAUSE BLOATING. BIOPSY WILL BE BACK IN 7 DAYS. Follow up in SEP 2014. NEXT COLONOSCOPY IN 10 YEARS WITH PROPOFOL.       _______________________________ Rosalie DoctorJonette Eva, MD 05/25/2013 11:29 AM     PATIENT NAME:  Rebekah Henderson, Rebekah Henderson MR#: [id number]

## 2013-05-27 ENCOUNTER — Encounter (HOSPITAL_COMMUNITY): Payer: Self-pay | Admitting: Gastroenterology

## 2013-06-02 ENCOUNTER — Telehealth: Payer: Self-pay

## 2013-06-02 NOTE — Telephone Encounter (Signed)
Please call pt. She had HYPERPLASTIC POLYPS removed from her rectum. HER stomach Bx shows gastritis and BENIGN POLYPS. HER SMALL BOWEL/colon BIOPSIES ARE NORMAL.  HER DIARRHEA/ABDOMINAL PAIN ARE MOST LIKELY DUE TO HER NOT HAVING A GALLBLADDER, GASTRITIS, AND IBS.    SHE SHOULD HAVE AN HYDROGEN BREATH TEST TO EVALUATE FOR THE WRONG BACTERIA MIX IN HER SMALL BOWEL(HBT FOR SIBO). CONTINUE PROTONIX. TAKE DICYCLOMINE 30 MINUTES PRIOR TO MEALS AND AT BEDTIME.  FOLLOW A LOW FAT/HIGH FIBER. AVOID ITEMS THAT CAUSE BLOATING.   Follow up in SEP 2014. NEXT COLONOSCOPY IN 10 YEARS WITH PROPOFOL.

## 2013-06-02 NOTE — Telephone Encounter (Signed)
Pt requesting results of procedure

## 2013-06-03 NOTE — Telephone Encounter (Signed)
Cc PCP 

## 2013-07-04 ENCOUNTER — Encounter (HOSPITAL_COMMUNITY): Payer: Self-pay | Admitting: Emergency Medicine

## 2013-07-04 DIAGNOSIS — K219 Gastro-esophageal reflux disease without esophagitis: Secondary | ICD-10-CM | POA: Insufficient documentation

## 2013-07-04 DIAGNOSIS — Z7982 Long term (current) use of aspirin: Secondary | ICD-10-CM | POA: Insufficient documentation

## 2013-07-04 DIAGNOSIS — Z9889 Other specified postprocedural states: Secondary | ICD-10-CM | POA: Insufficient documentation

## 2013-07-04 DIAGNOSIS — R259 Unspecified abnormal involuntary movements: Secondary | ICD-10-CM | POA: Insufficient documentation

## 2013-07-04 DIAGNOSIS — Z860100 Personal history of colon polyps, unspecified: Secondary | ICD-10-CM | POA: Insufficient documentation

## 2013-07-04 DIAGNOSIS — J4489 Other specified chronic obstructive pulmonary disease: Secondary | ICD-10-CM | POA: Insufficient documentation

## 2013-07-04 DIAGNOSIS — E039 Hypothyroidism, unspecified: Secondary | ICD-10-CM | POA: Insufficient documentation

## 2013-07-04 DIAGNOSIS — R112 Nausea with vomiting, unspecified: Secondary | ICD-10-CM | POA: Insufficient documentation

## 2013-07-04 DIAGNOSIS — F411 Generalized anxiety disorder: Secondary | ICD-10-CM | POA: Insufficient documentation

## 2013-07-04 DIAGNOSIS — I10 Essential (primary) hypertension: Secondary | ICD-10-CM | POA: Insufficient documentation

## 2013-07-04 DIAGNOSIS — Z79899 Other long term (current) drug therapy: Secondary | ICD-10-CM | POA: Insufficient documentation

## 2013-07-04 DIAGNOSIS — R059 Cough, unspecified: Secondary | ICD-10-CM | POA: Insufficient documentation

## 2013-07-04 DIAGNOSIS — IMO0001 Reserved for inherently not codable concepts without codable children: Secondary | ICD-10-CM | POA: Insufficient documentation

## 2013-07-04 DIAGNOSIS — Z87891 Personal history of nicotine dependence: Secondary | ICD-10-CM | POA: Insufficient documentation

## 2013-07-04 DIAGNOSIS — R071 Chest pain on breathing: Secondary | ICD-10-CM | POA: Insufficient documentation

## 2013-07-04 DIAGNOSIS — J449 Chronic obstructive pulmonary disease, unspecified: Secondary | ICD-10-CM | POA: Insufficient documentation

## 2013-07-04 DIAGNOSIS — R05 Cough: Secondary | ICD-10-CM | POA: Insufficient documentation

## 2013-07-04 DIAGNOSIS — R197 Diarrhea, unspecified: Secondary | ICD-10-CM | POA: Insufficient documentation

## 2013-07-04 DIAGNOSIS — M129 Arthropathy, unspecified: Secondary | ICD-10-CM | POA: Insufficient documentation

## 2013-07-04 DIAGNOSIS — Z880 Allergy status to penicillin: Secondary | ICD-10-CM | POA: Insufficient documentation

## 2013-07-04 DIAGNOSIS — Z8719 Personal history of other diseases of the digestive system: Secondary | ICD-10-CM | POA: Insufficient documentation

## 2013-07-04 NOTE — ED Notes (Signed)
Patient c/o nausea, vomiting, diarrhea, intermittent neck and left arm pain since Friday.  Patient states when she lays down, she twitches.

## 2013-07-05 ENCOUNTER — Emergency Department (HOSPITAL_COMMUNITY)
Admission: EM | Admit: 2013-07-05 | Discharge: 2013-07-05 | Disposition: A | Discharge status: Home / Self Care | Payer: Medicare Other | Attending: Emergency Medicine | Admitting: Emergency Medicine

## 2013-07-05 ENCOUNTER — Emergency Department (HOSPITAL_COMMUNITY): Payer: Medicare Other

## 2013-07-05 DIAGNOSIS — R253 Fasciculation: Secondary | ICD-10-CM

## 2013-07-05 DIAGNOSIS — R197 Diarrhea, unspecified: Secondary | ICD-10-CM

## 2013-07-05 DIAGNOSIS — R079 Chest pain, unspecified: Secondary | ICD-10-CM

## 2013-07-05 DIAGNOSIS — R112 Nausea with vomiting, unspecified: Secondary | ICD-10-CM

## 2013-07-05 LAB — BASIC METABOLIC PANEL
BUN: 10 mg/dL (ref 6–23)
CO2: 29 mEq/L (ref 19–32)
Calcium: 9.8 mg/dL (ref 8.4–10.5)
Chloride: 94 mEq/L — ABNORMAL LOW (ref 96–112)
Creatinine, Ser: 0.71 mg/dL (ref 0.50–1.10)
GFR calc Af Amer: >90 mL/min (ref >90)
GFR calc non Af Amer: >90 mL/min (ref >90)
Glucose, Bld: 104 mg/dL — ABNORMAL HIGH (ref 70–99)
Potassium: 3.3 mEq/L — ABNORMAL LOW (ref 3.5–5.1)
Sodium: 134 mEq/L — ABNORMAL LOW (ref 135–145)

## 2013-07-05 LAB — CBC WITH DIFFERENTIAL/PLATELET
Basophils Absolute: 0 10*3/uL (ref 0.0–0.1)
Basophils Relative: 0 % (ref 0–1)
Eosinophils Absolute: 0.2 10*3/uL (ref 0.0–0.7)
Eosinophils Relative: 1 % (ref 0–5)
HCT: 40.5 % (ref 36.0–46.0)
Hemoglobin: 13.8 g/dL (ref 12.0–15.0)
Lymphocytes Relative: 19 % (ref 12–46)
Lymphs Abs: 2.7 10*3/uL (ref 0.7–4.0)
MCH: 29 pg (ref 26.0–34.0)
MCHC: 34.1 g/dL (ref 30.0–36.0)
MCV: 85.1 fL (ref 78.0–100.0)
Monocytes Absolute: 1.2 10*3/uL — ABNORMAL HIGH (ref 0.1–1.0)
Monocytes Relative: 8 % (ref 3–12)
Neutro Abs: 9.7 10*3/uL — ABNORMAL HIGH (ref 1.7–7.7)
Neutrophils Relative %: 71 % (ref 43–77)
Platelets: 347 10*3/uL (ref 150–400)
RBC: 4.76 MIL/uL (ref 3.87–5.11)
RDW: 14.1 % (ref 11.5–15.5)
WBC: 13.8 10*3/uL — ABNORMAL HIGH (ref 4.0–10.5)

## 2013-07-05 LAB — URINALYSIS, ROUTINE W REFLEX MICROSCOPIC
Hgb urine dipstick: NEGATIVE
Leukocytes, UA: NEGATIVE
Nitrite: NEGATIVE
Specific Gravity, Urine: 1.025 (ref 1.005–1.030)
Urobilinogen, UA: 1 mg/dL (ref 0.0–1.0)

## 2013-07-05 LAB — TROPONIN I: Troponin I: <0.3 ng/mL (ref <0.30)

## 2013-07-05 MED ORDER — KETOROLAC TROMETHAMINE 30 MG/ML IJ SOLN
30.0000 mg | Freq: Once | INTRAMUSCULAR | Status: AC
  Administered 2013-07-05: 30 mg via INTRAVENOUS
  Filled 2013-07-05: qty 1

## 2013-07-05 MED ORDER — LORAZEPAM 2 MG/ML IJ SOLN
1.0000 mg | Freq: Once | INTRAMUSCULAR | Status: AC
  Administered 2013-07-05: 1 mg via INTRAVENOUS
  Filled 2013-07-05: qty 1

## 2013-07-05 MED ORDER — SODIUM CHLORIDE 0.9 % IV BOLUS (SEPSIS)
1000.0000 mL | Freq: Once | INTRAVENOUS | Status: AC
  Administered 2013-07-05: 1000 mL via INTRAVENOUS

## 2013-07-05 MED ORDER — ONDANSETRON HCL 4 MG/2ML IJ SOLN
4.0000 mg | Freq: Once | INTRAMUSCULAR | Status: AC
  Administered 2013-07-05: 4 mg via INTRAVENOUS
  Filled 2013-07-05: qty 2

## 2013-07-05 MED ORDER — HYDROMORPHONE HCL PF 1 MG/ML IJ SOLN
1.0000 mg | Freq: Once | INTRAMUSCULAR | Status: AC
  Administered 2013-07-05: 1 mg via INTRAVENOUS
  Filled 2013-07-05: qty 1

## 2013-07-05 MED ORDER — ONDANSETRON HCL 4 MG PO TABS: 4.0000 mg | ORAL_TABLET | Freq: Four times a day (QID) | ORAL | Status: DC

## 2013-07-06 ENCOUNTER — Telehealth: Payer: Self-pay | Admitting: Gastroenterology

## 2013-07-06 NOTE — Telephone Encounter (Signed)
Pt called earlier to set up procedure (I believe it's for a hydrogen breath test). Please call patient at [phone]

## 2013-07-06 NOTE — Telephone Encounter (Signed)
I returned patients call I LMOM  

## 2013-07-06 NOTE — Telephone Encounter (Signed)
I spoke to Rebekah Henderson and told her that I will get this scheduled for her as soon as the equipment is working in [employer]

## 2013-07-07 ENCOUNTER — Other Ambulatory Visit: Payer: Self-pay | Admitting: Gastroenterology

## 2013-07-07 NOTE — Telephone Encounter (Signed)
No PAC Req for HBT per [employer]

## 2013-07-07 NOTE — Telephone Encounter (Signed)
Patient is scheduled for HBT on Thursday August 14th at 7:30 am and I have mailed her the instructions

## 2013-07-08 NOTE — ED Provider Notes (Signed)
CSN: [id number]     Arrival date & time 07/04/13  2303 History     First MD Initiated Contact with Patient 07/05/13 0035     Chief Complaint  Patient presents with  . Nausea  . Diarrhea  . Neck Pain  . Arm Pain   (Consider location/radiation/quality/duration/timing/severity/associated sxs/prior Treatment) HPI  57y female with multiple complaints. Patient has been having nausea, vomiting diarrhea for the past approximately 2 days ago. Subjective fever. Chills. No chest pain. Mild shortness of breath. Occasional nonproductive cough. No unusual leg pain or swelling. No sick contacts. No urinary complaints patient feels like she is having intermittent involuntary twitching of her upper extremities. Not sustained. She is conscious this is happening. She denies any ingestion. No recent medication changes. No acute numbness, tingling or loss of strength. No headaches. No visual complaints.  Past Medical History  Diagnosis Date  . COPD (chronic obstructive pulmonary disease)   . Hypertension   . Fibromyalgia   . IBS (irritable bowel syndrome)   . GERD (gastroesophageal reflux disease)   . Colon polyps   . Hypothyroidism   . Anxiety   . Arthritis   . PONV (postoperative nausea and vomiting)     pt also states that she had some difficulty breathing after cervical fusion   Past Surgical History  Procedure Laterality Date  . Tonsillectomy    . Breast enhancement surgery    . Breast implant removal    . Upper gastrointestinal endoscopy    . Cervical fusion  AUG 2013  . Colonoscopy  2007 [place]    POLYPS  . Cholecystectomy  1999  . Bladder suspension    . Colonoscopy with propofol N/A 05/25/2013    Procedure: COLONOSCOPY WITH PROPOFOL(at cecum 0957) total withdrawal time=67min);  Surgeon: West Bali, MD;  Location: [facility];  Service: Endoscopy;  Laterality: N/A;  . Esophagogastroduodenoscopy (egd) with propofol N/A 05/25/2013    Procedure: ESOPHAGOGASTRODUODENOSCOPY (EGD) WITH  PROPOFOL;  Surgeon: West Bali, MD;  Location: [facility];  Service: Endoscopy;  Laterality: N/A;  . Esophageal biopsy N/A 05/25/2013    Procedure: BIOPSIES (Random Colon; Duodenal; Gastric);  Surgeon: West Bali, MD;  Location: [facility];  Service: Endoscopy;  Laterality: N/A;  . Polypectomy N/A 05/25/2013    Procedure: POLYPECTOMY (Rectal and Gastric);  Surgeon: West Bali, MD;  Location: [facility];  Service: Endoscopy;  Laterality: N/A;   Family History  Problem Relation Age of Onset  . Colon cancer Neg Hx   . Colon polyps Neg Hx    History  Substance Use Topics  . Smoking status: Former Smoker -- 2.00 packs/day for 20 years    Quit date: 05/20/2004  . Smokeless tobacco: Not on file  . Alcohol Use: No   OB History   Grav Para Term Preterm Abortions TAB SAB Ect Mult Living                 Review of Systems  All systems reviewed and negative, other than as noted in HPI.   Allergies  Penicillins; Codeine; and Ranitidine hcl  Home Medications   Current Outpatient Rx  Name  Route  Sig  Dispense  Refill  . albuterol (PROVENTIL) 2 MG tablet   Oral   Take 2 mg by mouth 4 (four) times daily.           Marland Kitchen amLODipine (NORVASC) 5 MG tablet   Oral   Take 5 mg by mouth daily.           Marland Kitchen  aspirin EC 81 MG tablet   Oral   Take 81 mg by mouth daily.         Marland Kitchen estazolam (PROSOM) 2 MG tablet   Oral   Take 2 mg by mouth at bedtime.         Marland Kitchen estradiol (ESTRACE) 2 MG tablet   Oral   Take 2 mg by mouth daily.           Marland Kitchen FLUoxetine (PROZAC) 40 MG capsule   Oral   Take 40 mg by mouth daily.           Marland Kitchen HYDROcodone-acetaminophen (NORCO/VICODIN) 5-325 MG per tablet   Oral   Take 1 tablet by mouth every 6 (six) hours as needed for pain.          Marland Kitchen levothyroxine (SYNTHROID, LEVOTHROID) 25 MCG tablet   Oral   Take 25 mcg by mouth daily.           . metoprolol succinate (TOPROL-XL) 25 MG 24 hr tablet   Oral   Take 25 mg by mouth daily.         .  pantoprazole (PROTONIX) 40 MG tablet   Oral   Take 40 mg by mouth daily.           Marland Kitchen triamterene-hydrochlorothiazide (DYAZIDE) 37.5-25 MG per capsule   Oral   Take 1 capsule by mouth every morning.           . dicyclomine (BENTYL) 20 MG tablet      1/2 TO 1 PO 30 MINS PRIOR TO MEALS TID AND AT BEDTIME   120 tablet   11   . ondansetron (ZOFRAN) 4 MG tablet   Oral   Take 1 tablet (4 mg total) by mouth every 6 (six) hours.   12 tablet   0    BP 105/75  Pulse 72  Temp(Src) 97.7 F (36.5 C) (Oral)  Resp 21  Ht 5\' 4"  (1.626 m)  Wt 200 lb (90.719 kg)  BMI 34.31 kg/m2  SpO2 93% Physical Exam  Nursing note and vitals reviewed. Constitutional: She appears well-developed and well-nourished. No distress.  HENT:  Head: Normocephalic and atraumatic.  Eyes: Conjunctivae are normal. Right eye exhibits no discharge. Left eye exhibits no discharge.  Neck: Neck supple.  Cardiovascular: Normal rate, regular rhythm and normal heart sounds.  Exam reveals no gallop and no friction rub.   No murmur heard. Pulmonary/Chest: Effort normal and breath sounds normal. No respiratory distress. She exhibits tenderness.  Tenderness to palpation over the left anterior chest wall. No concerning skin lesions. No crepitus.  Abdominal: Soft. She exhibits no distension. There is no tenderness.  Musculoskeletal: She exhibits no edema and no tenderness.  Lower extremities symmetric as compared to each other. No calf tenderness. Negative Homan's. No palpable cords.    Neurological: She is alert.  Skin: Skin is warm and dry.  Psychiatric: She has a normal mood and affect. Her behavior is normal. Thought content normal.    ED Course   Procedures (including critical care time)  Labs Reviewed  CBC WITH DIFFERENTIAL - Abnormal; Notable for the following:    WBC 13.8 (*)    Neutro Abs 9.7 (*)    Monocytes Absolute 1.2 (*)    All other components within normal limits  BASIC METABOLIC PANEL - Abnormal;  Notable for the following:    Sodium 134 (*)    Potassium 3.3 (*)    Chloride 94 (*)    Glucose,  Bld 104 (*)    All other components within normal limits  URINALYSIS, ROUTINE W REFLEX MICROSCOPIC - Abnormal; Notable for the following:    Ketones, ur TRACE (*)    All other components within normal limits  TROPONIN I    EKG:  Rhythm: normal sinus Vent. rate 72 BPM PR interval 184 ms QRS duration 76 ms QT/QTc 422/462 ms ST segments: ns st changes   No results found. 1. Nausea and vomiting   2. Chest pain   3. Diarrhea   4. Twitching     MDM  57 year old female with multiple symptoms and pan positive review of symptoms. Very low suspicion for emergent etiology. Workup has been pretty unremarkable. I feel she is stable for discharge. Emergent return precautions were discussed.  Raeford Razor, MD 07/08/13 1055

## 2013-07-09 ENCOUNTER — Encounter (HOSPITAL_COMMUNITY): Payer: Self-pay | Admitting: Pharmacy Technician

## 2013-07-12 ENCOUNTER — Encounter: Payer: Self-pay | Admitting: Gastroenterology

## 2013-07-13 ENCOUNTER — Telehealth: Payer: Self-pay | Admitting: Gastroenterology

## 2013-07-13 NOTE — Telephone Encounter (Signed)
Pt called to say that she is scheduled for HBT on Thursday and has started taking an antibiotic and will need to [facility]. [phone]

## 2013-07-13 NOTE — Telephone Encounter (Signed)
LMOM for patient to return my call.

## 2013-07-14 ENCOUNTER — Telehealth: Payer: Self-pay | Admitting: Gastroenterology

## 2013-07-14 NOTE — Telephone Encounter (Signed)
R/S To Aug 27 at 8:30 and Rebekah Henderson is aware

## 2013-07-14 NOTE — Telephone Encounter (Signed)
Lmom for patient to return my call

## 2013-07-14 NOTE — Telephone Encounter (Signed)
Message copied by Glendora Score on Wed Jul 14, 2013  3:54 PM ------      Message from: West Bali      Created: Tue Jul 13, 2013  2:17 PM       [facility] HBT FOR 7 DAYS AFTER SHE COMPLETES HER ABX.            ----- Message -----         From: Glendora Score         Sent: 07/13/2013   1:34 PM           To: West Bali, MD            Dr. Darrick Penna, Mrs. Mcneil is calling and informing us that she started an antibiotic and she is scheduled for an HBT, does she need to be R/S and if yes how long do we wait?        ------

## 2013-07-15 ENCOUNTER — Encounter: Payer: Self-pay | Admitting: Gastroenterology

## 2013-07-28 ENCOUNTER — Encounter (HOSPITAL_COMMUNITY)
Admission: RE | Disposition: A | Discharge status: Home / Self Care | Payer: Self-pay | Source: Ambulatory Visit | Attending: Gastroenterology

## 2013-07-28 ENCOUNTER — Ambulatory Visit (HOSPITAL_COMMUNITY)
Admission: RE | Admit: 2013-07-28 | Discharge: 2013-07-28 | Disposition: A | Discharge status: Home / Self Care | Payer: Medicare Other | Source: Ambulatory Visit | Attending: Gastroenterology | Admitting: Gastroenterology

## 2013-07-28 DIAGNOSIS — R109 Unspecified abdominal pain: Secondary | ICD-10-CM | POA: Insufficient documentation

## 2013-07-28 DIAGNOSIS — Z5309 Procedure and treatment not carried out because of other contraindication: Secondary | ICD-10-CM | POA: Insufficient documentation

## 2013-07-28 DIAGNOSIS — R197 Diarrhea, unspecified: Secondary | ICD-10-CM | POA: Insufficient documentation

## 2013-07-28 SURGERY — CANCELLED PROCEDURE

## 2013-07-28 MED ORDER — LACTULOSE 10 GM/15ML PO SOLN
25.0000 g | Freq: Once | ORAL | Status: AC
  Administered 2013-07-28: 25 g via ORAL

## 2013-07-28 MED ORDER — LACTULOSE 10 GM/15ML PO SOLN
ORAL | Status: AC
  Filled 2013-07-28: qty 60

## 2013-07-28 NOTE — Progress Notes (Addendum)
No beans, bran or high fiber cereal the day before the procedure? yes NPO except for water 12 hours before procedure? yes No smoking, sleeping or vigorous exercising for at least 30 before procedure? yes Recent antibiotic use and/or diarrhea? no   If yes, physician notified.  Time Baseline 15 mins 30 mins 45 mins 60 mins 75 mins 90 mins 105 mins 120 mins 135 mins 150 mins 165 mins 180 mins  H2-ppm    0     0    0     0                Pt vomited  200 ml orange vomitus at 9:00am.   Dr Darrick Penna notified.  Procedure cancelled for today to be rescheduled with Zofran at a later date.  Office will call Patient with new time.  Pt D/Ced home.

## 2014-03-10 ENCOUNTER — Emergency Department (HOSPITAL_COMMUNITY)
Admission: EM | Admit: 2014-03-10 | Discharge: 2014-03-10 | Disposition: A | Discharge status: Home / Self Care | Payer: Medicare Other | Attending: Emergency Medicine | Admitting: Emergency Medicine

## 2014-03-10 ENCOUNTER — Encounter (HOSPITAL_COMMUNITY): Payer: Self-pay | Admitting: Emergency Medicine

## 2014-03-10 DIAGNOSIS — M129 Arthropathy, unspecified: Secondary | ICD-10-CM | POA: Insufficient documentation

## 2014-03-10 DIAGNOSIS — J4489 Other specified chronic obstructive pulmonary disease: Secondary | ICD-10-CM | POA: Insufficient documentation

## 2014-03-10 DIAGNOSIS — Z880 Allergy status to penicillin: Secondary | ICD-10-CM | POA: Insufficient documentation

## 2014-03-10 DIAGNOSIS — K529 Noninfective gastroenteritis and colitis, unspecified: Secondary | ICD-10-CM

## 2014-03-10 DIAGNOSIS — I10 Essential (primary) hypertension: Secondary | ICD-10-CM | POA: Insufficient documentation

## 2014-03-10 DIAGNOSIS — E039 Hypothyroidism, unspecified: Secondary | ICD-10-CM | POA: Insufficient documentation

## 2014-03-10 DIAGNOSIS — Z7982 Long term (current) use of aspirin: Secondary | ICD-10-CM | POA: Insufficient documentation

## 2014-03-10 DIAGNOSIS — F411 Generalized anxiety disorder: Secondary | ICD-10-CM | POA: Insufficient documentation

## 2014-03-10 DIAGNOSIS — K219 Gastro-esophageal reflux disease without esophagitis: Secondary | ICD-10-CM | POA: Insufficient documentation

## 2014-03-10 DIAGNOSIS — J449 Chronic obstructive pulmonary disease, unspecified: Secondary | ICD-10-CM | POA: Insufficient documentation

## 2014-03-10 DIAGNOSIS — Z860100 Personal history of colon polyps, unspecified: Secondary | ICD-10-CM | POA: Insufficient documentation

## 2014-03-10 DIAGNOSIS — Z79899 Other long term (current) drug therapy: Secondary | ICD-10-CM | POA: Insufficient documentation

## 2014-03-10 DIAGNOSIS — Z9089 Acquired absence of other organs: Secondary | ICD-10-CM | POA: Insufficient documentation

## 2014-03-10 DIAGNOSIS — K5289 Other specified noninfective gastroenteritis and colitis: Secondary | ICD-10-CM | POA: Insufficient documentation

## 2014-03-10 DIAGNOSIS — Z87891 Personal history of nicotine dependence: Secondary | ICD-10-CM | POA: Insufficient documentation

## 2014-03-10 LAB — CBC WITH DIFFERENTIAL/PLATELET
BASOS PCT: 0 % (ref 0–1)
Basophils Absolute: 0 10*3/uL (ref 0.0–0.1)
EOS ABS: 0.1 10*3/uL (ref 0.0–0.7)
EOS PCT: 1 % (ref 0–5)
HEMATOCRIT: 42.4 % (ref 36.0–46.0)
HEMOGLOBIN: 13.8 g/dL (ref 12.0–15.0)
Lymphocytes Relative: 7 % — ABNORMAL LOW (ref 12–46)
Lymphs Abs: 0.8 10*3/uL (ref 0.7–4.0)
MCH: 28.5 pg (ref 26.0–34.0)
MCHC: 32.5 g/dL (ref 30.0–36.0)
MCV: 87.4 fL (ref 78.0–100.0)
MONO ABS: 0.7 10*3/uL (ref 0.1–1.0)
MONOS PCT: 6 % (ref 3–12)
Neutro Abs: 9.8 10*3/uL — ABNORMAL HIGH (ref 1.7–7.7)
Neutrophils Relative %: 86 % — ABNORMAL HIGH (ref 43–77)
Platelets: 313 10*3/uL (ref 150–400)
RBC: 4.85 MIL/uL (ref 3.87–5.11)
RDW: 14.5 % (ref 11.5–15.5)
WBC: 11.4 10*3/uL — ABNORMAL HIGH (ref 4.0–10.5)

## 2014-03-10 LAB — BASIC METABOLIC PANEL
BUN: 14 mg/dL (ref 6–23)
CALCIUM: 9.2 mg/dL (ref 8.4–10.5)
CHLORIDE: 100 meq/L (ref 96–112)
CO2: 30 mEq/L (ref 19–32)
CREATININE: 0.63 mg/dL (ref 0.50–1.10)
Glucose, Bld: 120 mg/dL — ABNORMAL HIGH (ref 70–99)
Potassium: 4 mEq/L (ref 3.7–5.3)
Sodium: 141 mEq/L (ref 137–147)

## 2014-03-10 LAB — HEPATIC FUNCTION PANEL
ALBUMIN: 3.3 g/dL — AB (ref 3.5–5.2)
ALT: 11 U/L (ref 0–35)
AST: 17 U/L (ref 0–37)
Alkaline Phosphatase: 60 U/L (ref 39–117)
Bilirubin, Direct: <0.2 mg/dL (ref 0.0–0.3)
TOTAL PROTEIN: 7.7 g/dL (ref 6.0–8.3)
Total Bilirubin: 0.4 mg/dL (ref 0.3–1.2)

## 2014-03-10 LAB — URINE MICROSCOPIC-ADD ON

## 2014-03-10 LAB — LIPASE, BLOOD: LIPASE: 37 U/L (ref 11–59)

## 2014-03-10 LAB — URINALYSIS, ROUTINE W REFLEX MICROSCOPIC
Glucose, UA: NEGATIVE mg/dL
HGB URINE DIPSTICK: NEGATIVE
KETONES UR: NEGATIVE mg/dL
Leukocytes, UA: NEGATIVE
NITRITE: NEGATIVE
Protein, ur: 100 mg/dL — AB
SPECIFIC GRAVITY, URINE: 1.03 (ref 1.005–1.030)
Urobilinogen, UA: 0.2 mg/dL (ref 0.0–1.0)
pH: 6 (ref 5.0–8.0)

## 2014-03-10 MED ORDER — ONDANSETRON HCL 4 MG/2ML IJ SOLN: 4.0000 mg | Freq: Once | INTRAMUSCULAR | Status: DC

## 2014-03-10 MED ORDER — ONDANSETRON HCL 4 MG/2ML IJ SOLN
4.0000 mg | Freq: Once | INTRAMUSCULAR | Status: AC
  Administered 2014-03-10: 4 mg via INTRAVENOUS
  Filled 2014-03-10: qty 2

## 2014-03-10 MED ORDER — ONDANSETRON HCL 4 MG/2ML IJ SOLN
4.0000 mg | Freq: Once | INTRAMUSCULAR | Status: AC
  Administered 2014-03-10: 4 mg via INTRAMUSCULAR
  Filled 2014-03-10: qty 2

## 2014-03-10 MED ORDER — PROMETHAZINE HCL 25 MG RE SUPP: 25.0000 mg | Freq: Four times a day (QID) | RECTAL | Status: DC | PRN

## 2014-03-10 MED ORDER — DIPHENHYDRAMINE HCL 50 MG/ML IJ SOLN
12.5000 mg | Freq: Once | INTRAMUSCULAR | Status: AC
  Administered 2014-03-10: 12.5 mg via INTRAVENOUS
  Filled 2014-03-10: qty 1

## 2014-03-10 MED ORDER — MECLIZINE HCL 12.5 MG PO TABS
50.0000 mg | ORAL_TABLET | Freq: Once | ORAL | Status: AC
  Administered 2014-03-10: 50 mg via ORAL
  Filled 2014-03-10: qty 4

## 2014-03-10 MED ORDER — PROMETHAZINE HCL 25 MG/ML IJ SOLN
12.5000 mg | Freq: Once | INTRAMUSCULAR | Status: AC
  Administered 2014-03-10: 12.5 mg via INTRAVENOUS
  Filled 2014-03-10: qty 1

## 2014-03-10 MED ORDER — ONDANSETRON 8 MG PO TBDP
8.0000 mg | ORAL_TABLET | Freq: Once | ORAL | Status: AC
  Administered 2014-03-10: 8 mg via ORAL
  Filled 2014-03-10: qty 1

## 2014-03-10 MED ORDER — MECLIZINE HCL 25 MG PO TABS: 25.0000 mg | ORAL_TABLET | Freq: Three times a day (TID) | ORAL | Status: DC | PRN

## 2014-03-10 MED ORDER — MECLIZINE HCL 12.5 MG PO TABS
ORAL_TABLET | ORAL | Status: AC
  Filled 2014-03-10: qty 3

## 2014-03-10 MED ORDER — METOCLOPRAMIDE HCL 5 MG/ML IJ SOLN
10.0000 mg | Freq: Once | INTRAMUSCULAR | Status: AC
  Administered 2014-03-10: 5 mg via INTRAVENOUS
  Filled 2014-03-10: qty 2

## 2014-03-10 MED ORDER — LORAZEPAM 2 MG/ML IJ SOLN
0.5000 mg | Freq: Once | INTRAMUSCULAR | Status: AC
  Administered 2014-03-10: 0.5 mg via INTRAVENOUS
  Filled 2014-03-10: qty 1

## 2014-03-10 MED ORDER — SODIUM CHLORIDE 0.9 % IV BOLUS (SEPSIS)
1000.0000 mL | Freq: Once | INTRAVENOUS | Status: AC
  Administered 2014-03-10: 1000 mL via INTRAVENOUS

## 2014-03-10 MED ORDER — HYOSCYAMINE SULFATE 0.125 MG PO TABS
0.2500 mg | ORAL_TABLET | Freq: Once | ORAL | Status: AC
  Administered 2014-03-10: 0.25 mg via ORAL
  Filled 2014-03-10: qty 2

## 2014-03-10 NOTE — Discharge Instructions (Signed)
Viral Gastroenteritis Viral gastroenteritis is also known as stomach flu. This condition affects the stomach and intestinal tract. It can cause sudden diarrhea and vomiting. The illness typically lasts 3 to 8 days. Most people develop an immune response that eventually gets rid of the virus. While this natural response develops, the virus can make you quite ill. CAUSES  Many different viruses can cause gastroenteritis, such as rotavirus or noroviruses. You can catch one of these viruses by consuming contaminated food or water. You may also catch a virus by sharing utensils or other personal items with an infected person or by touching a contaminated surface. SYMPTOMS  The most common symptoms are diarrhea and vomiting. These problems can cause a severe loss of body fluids (dehydration) and a body salt (electrolyte) imbalance. Other symptoms may include:  Fever.  Headache.  Fatigue.  Abdominal pain. DIAGNOSIS  Your caregiver can usually diagnose viral gastroenteritis based on your symptoms and a physical exam. A stool sample may also be taken to test for the presence of viruses or other infections. TREATMENT  This illness typically goes away on its own. Treatments are aimed at rehydration. The most serious cases of viral gastroenteritis involve vomiting so severely that you are not able to keep fluids down. In these cases, fluids must be given through an intravenous line (IV). HOME CARE INSTRUCTIONS   Drink enough fluids to keep your urine clear or pale yellow. Drink small amounts of fluids frequently and increase the amounts as tolerated.  Ask your caregiver for specific rehydration instructions.  Avoid:  Foods high in sugar.  Alcohol.  Carbonated drinks.  Tobacco.  Juice.  Caffeine drinks.  Extremely hot or cold fluids.  Fatty, greasy foods.  Too much intake of anything at one time.  Dairy products until 24 to 48 hours after diarrhea stops.  You may consume probiotics.  Probiotics are active cultures of beneficial bacteria. They may lessen the amount and number of diarrheal stools in adults. Probiotics can be found in yogurt with active cultures and in supplements.  Wash your hands well to avoid spreading the virus.  Only take over-the-counter or prescription medicines for pain, discomfort, or fever as directed by your caregiver. Do not give aspirin to children. Antidiarrheal medicines are not recommended.  Ask your caregiver if you should continue to take your regular prescribed and over-the-counter medicines.  Keep all follow-up appointments as directed by your caregiver. SEEK IMMEDIATE MEDICAL CARE IF:   You are unable to keep fluids down.  You do not urinate at least once every 6 to 8 hours.  You develop shortness of breath.  You notice blood in your stool or vomit. This may look like coffee grounds.  You have abdominal pain that increases or is concentrated in one small area (localized).  You have persistent vomiting or diarrhea.  You have a fever.  The patient is a child younger than 3 months, and he or she has a fever.  The patient is a child older than 3 months, and he or she has a fever and persistent symptoms.  The patient is a child older than 3 months, and he or she has a fever and symptoms suddenly get worse.  The patient is a baby, and he or she has no tears when crying. MAKE SURE YOU:   Understand these instructions.  Will watch your condition.  Will get help right away if you are not doing well or get worse. Document Released: 11/18/2005 Document Revised: 02/10/2012 Document Reviewed: 09/04/2011   ExitCare Patient Information 2014 [employer].  

## 2014-03-10 NOTE — ED Notes (Signed)
Pt vomited moderate amount of yellow emesis just prior to zofran given. Comfort measures given.

## 2014-03-10 NOTE — ED Provider Notes (Signed)
CSN: [id number]     Arrival date & time 03/10/14  6546 History   First MD Initiated Contact with Patient 03/10/14 0945     Chief Complaint  Patient presents with  . Emesis     (Consider location/radiation/quality/duration/timing/severity/associated sxs/prior Treatment) HPI Comments: Rebekah Henderson is a 58 y.o. Female with a history significant for IBS and GERD presenting with sudden onset of nausea, vomiting and diarrhea which woke her from sleep around 4 AM today.  She states she was unable to leave the bathroom and is unable to quantify the number of episodes of vomiting or diarrhea.  She states that her vomit started out looking very dark, but has changed to bright yellow, and has had symptoms even after arrival here.  She reports having occasional episodes of similar symptoms associated with her IBS, but today it is much more intense and frequent.  She has generalized abdominal discomfort without localizing pain.  She's had no fevers or chills, denies recent illnesses, no chest pain or shortness of breath, also denies dizziness but feels generalized fatigue.  She has had no medications prior to arrival.  Her past surgical history is significant for cholecystectomy.     The history is provided by the patient.    Past Medical History  Diagnosis Date  . COPD (chronic obstructive pulmonary disease)   . Hypertension   . Fibromyalgia   . IBS (irritable bowel syndrome)   . GERD (gastroesophageal reflux disease)   . Colon polyps   . Hypothyroidism   . Anxiety   . Arthritis   . PONV (postoperative nausea and vomiting)     pt also states that she had some difficulty breathing after cervical fusion   Past Surgical History  Procedure Laterality Date  . Tonsillectomy    . Breast enhancement surgery    . Breast implant removal    . Upper gastrointestinal endoscopy    . Cervical fusion  AUG 2013  . Colonoscopy  2007 [place]    POLYPS  . Cholecystectomy  1999  . Bladder suspension    .  Colonoscopy with propofol N/A 05/25/2013    Procedure: COLONOSCOPY WITH PROPOFOL(at cecum 0957) total withdrawal time=67min);  Surgeon: Danie Binder, MD;  Location: [facility];  Service: Endoscopy;  Laterality: N/A;  . Esophagogastroduodenoscopy (egd) with propofol N/A 05/25/2013    Procedure: ESOPHAGOGASTRODUODENOSCOPY (EGD) WITH PROPOFOL;  Surgeon: Danie Binder, MD;  Location: [facility];  Service: Endoscopy;  Laterality: N/A;  . Esophageal biopsy N/A 05/25/2013    Procedure: BIOPSIES (Random Colon; Duodenal; Gastric);  Surgeon: Danie Binder, MD;  Location: [facility];  Service: Endoscopy;  Laterality: N/A;  . Polypectomy N/A 05/25/2013    Procedure: POLYPECTOMY (Rectal and Gastric);  Surgeon: Danie Binder, MD;  Location: [facility];  Service: Endoscopy;  Laterality: N/A;   Family History  Problem Relation Age of Onset  . Colon cancer Neg Hx   . Colon polyps Neg Hx    History  Substance Use Topics  . Smoking status: Former Smoker -- 2.00 packs/day for 20 years    Quit date: 05/20/2004  . Smokeless tobacco: Not on file  . Alcohol Use: No   OB History   Grav Para Term Preterm Abortions TAB SAB Ect Mult Living                 Review of Systems  Constitutional: Negative for fever and chills.  HENT: Negative for congestion and sore throat.   Eyes: Negative.  Respiratory: Negative for chest tightness and shortness of breath.   Cardiovascular: Negative for chest pain.  Gastrointestinal: Positive for nausea, vomiting, abdominal pain and diarrhea.  Genitourinary: Negative.   Musculoskeletal: Negative for arthralgias, joint swelling and neck pain.  Skin: Negative.  Negative for rash and wound.  Neurological: Negative for dizziness, weakness, light-headedness, numbness and headaches.  Psychiatric/Behavioral: Negative.       Allergies  Penicillins; Codeine; and Ranitidine hcl  Home Medications   Current Outpatient Rx  Name  Route  Sig  Dispense  Refill  . albuterol (PROVENTIL) 2 MG  tablet   Oral   Take 2 mg by mouth daily as needed for wheezing or shortness of breath.          Marland Kitchen amLODipine (NORVASC) 5 MG tablet   Oral   Take 5 mg by mouth daily.           Marland Kitchen aspirin EC 81 MG tablet   Oral   Take 81 mg by mouth daily.         Marland Kitchen estazolam (PROSOM) 2 MG tablet   Oral   Take 2 mg by mouth at bedtime.         Marland Kitchen estradiol (ESTRACE) 2 MG tablet   Oral   Take 2 mg by mouth daily.           Marland Kitchen HYDROcodone-acetaminophen (NORCO/VICODIN) 5-325 MG per tablet   Oral   Take 1 tablet by mouth every 6 (six) hours as needed for pain.          Marland Kitchen levothyroxine (SYNTHROID, LEVOTHROID) 25 MCG tablet   Oral   Take 25 mcg by mouth daily.           Marland Kitchen loperamide (IMODIUM) 2 MG capsule   Oral   Take 2 mg by mouth 4 (four) times daily as needed for diarrhea or loose stools.         . metoprolol succinate (TOPROL-XL) 25 MG 24 hr tablet   Oral   Take 25 mg by mouth daily.         . ondansetron (ZOFRAN) 4 MG tablet   Oral   Take 1 tablet (4 mg total) by mouth every 6 (six) hours.   12 tablet   0   . pantoprazole (PROTONIX) 40 MG tablet   Oral   Take 40 mg by mouth daily.           Marland Kitchen triamterene-hydrochlorothiazide (DYAZIDE) 37.5-25 MG per capsule   Oral   Take 1 capsule by mouth every morning.           . meclizine (ANTIVERT) 25 MG tablet   Oral   Take 1 tablet (25 mg total) by mouth 3 (three) times daily as needed for dizziness.   15 tablet   0   . promethazine (PHENERGAN) 25 MG suppository   Rectal   Place 1 suppository (25 mg total) rectally every 6 (six) hours as needed for nausea or vomiting.   12 each   0    BP 120/74  Pulse 100  Temp(Src) 98.1 F (36.7 C) (Oral)  Resp 16  SpO2 96% Physical Exam  Nursing note and vitals reviewed. Constitutional: She appears well-developed and well-nourished.  HENT:  Head: Normocephalic and atraumatic.  Eyes: Conjunctivae are normal.  Neck: Normal range of motion.  Cardiovascular: Normal  rate, regular rhythm, normal heart sounds and intact distal pulses.   Pulmonary/Chest: Effort normal and breath sounds normal. She has no wheezes.  Abdominal:  Soft. Bowel sounds are normal. She exhibits no mass. There is generalized tenderness. There is no guarding.  Patient actively dry heaving on exam.  Musculoskeletal: Normal range of motion.  Neurological: She is alert.  Skin: Skin is warm and dry. She is not diaphoretic.  Psychiatric: She has a normal mood and affect.    ED Course  Procedures (including critical care time) Labs Review Labs Reviewed  CBC WITH DIFFERENTIAL - Abnormal; Notable for the following:    WBC 11.4 (*)    Neutrophils Relative % 86 (*)    Neutro Abs 9.8 (*)    Lymphocytes Relative 7 (*)    All other components within normal limits  BASIC METABOLIC PANEL - Abnormal; Notable for the following:    Glucose, Bld 120 (*)    All other components within normal limits  URINALYSIS, ROUTINE W REFLEX MICROSCOPIC - Abnormal; Notable for the following:    Bilirubin Urine SMALL (*)    Protein, ur 100 (*)    All other components within normal limits  URINE MICROSCOPIC-ADD ON - Abnormal; Notable for the following:    Squamous Epithelial / LPF MANY (*)    Bacteria, UA MANY (*)    Casts HYALINE CASTS (*)    All other components within normal limits  HEPATIC FUNCTION PANEL - Abnormal; Notable for the following:    Albumin 3.3 (*)    All other components within normal limits  LIPASE, BLOOD   Imaging Review No results found.   EKG Interpretation None      Results for orders placed during the hospital encounter of 03/10/14  CBC WITH DIFFERENTIAL      Result Value Ref Range   WBC 11.4 (*) 4.0 - 10.5 K/uL   RBC 4.85  3.87 - 5.11 MIL/uL   Hemoglobin 13.8  12.0 - 15.0 g/dL   HCT 42.4  36.0 - 46.0 %   MCV 87.4  78.0 - 100.0 fL   MCH 28.5  26.0 - 34.0 pg   MCHC 32.5  30.0 - 36.0 g/dL   RDW 14.5  11.5 - 15.5 %   Platelets 313  150 - 400 K/uL   Neutrophils  Relative % 86 (*) 43 - 77 %   Neutro Abs 9.8 (*) 1.7 - 7.7 K/uL   Lymphocytes Relative 7 (*) 12 - 46 %   Lymphs Abs 0.8  0.7 - 4.0 K/uL   Monocytes Relative 6  3 - 12 %   Monocytes Absolute 0.7  0.1 - 1.0 K/uL   Eosinophils Relative 1  0 - 5 %   Eosinophils Absolute 0.1  0.0 - 0.7 K/uL   Basophils Relative 0  0 - 1 %   Basophils Absolute 0.0  0.0 - 0.1 K/uL  BASIC METABOLIC PANEL      Result Value Ref Range   Sodium 141  137 - 147 mEq/L   Potassium 4.0  3.7 - 5.3 mEq/L   Chloride 100  96 - 112 mEq/L   CO2 30  19 - 32 mEq/L   Glucose, Bld 120 (*) 70 - 99 mg/dL   BUN 14  6 - 23 mg/dL   Creatinine, Ser 0.63  0.50 - 1.10 mg/dL   Calcium 9.2  8.4 - 10.5 mg/dL   GFR calc non Af Amer >90  >90 mL/min   GFR calc Af Amer >90  >90 mL/min  LIPASE, BLOOD      Result Value Ref Range   Lipase 37  11 - 59 U/L  URINALYSIS, ROUTINE W REFLEX MICROSCOPIC      Result Value Ref Range   Color, Urine YELLOW  YELLOW   APPearance CLEAR  CLEAR   Specific Gravity, Urine 1.030  1.005 - 1.030   pH 6.0  5.0 - 8.0   Glucose, UA NEGATIVE  NEGATIVE mg/dL   Hgb urine dipstick NEGATIVE  NEGATIVE   Bilirubin Urine SMALL (*) NEGATIVE   Ketones, ur NEGATIVE  NEGATIVE mg/dL   Protein, ur 100 (*) NEGATIVE mg/dL   Urobilinogen, UA 0.2  0.0 - 1.0 mg/dL   Nitrite NEGATIVE  NEGATIVE   Leukocytes, UA NEGATIVE  NEGATIVE  URINE MICROSCOPIC-ADD ON      Result Value Ref Range   Squamous Epithelial / LPF MANY (*) RARE   WBC, UA 0-2  <3 WBC/hpf   RBC / HPF 0-2  <3 RBC/hpf   Bacteria, UA MANY (*) RARE   Casts HYALINE CASTS (*) NEGATIVE  HEPATIC FUNCTION PANEL      Result Value Ref Range   Total Protein 7.7  6.0 - 8.3 g/dL   Albumin 3.3 (*) 3.5 - 5.2 g/dL   AST 17  0 - 37 U/L   ALT 11  0 - 35 U/L   Alkaline Phosphatase 60  39 - 117 U/L   Total Bilirubin 0.4  0.3 - 1.2 mg/dL   Bilirubin, Direct <0.2  0.0 - 0.3 mg/dL   Indirect Bilirubin NOT CALCULATED  0.3 - 0.9 mg/dL   Medications  ondansetron (ZOFRAN) injection  4 mg (4 mg Intravenous Given 03/10/14 0949)  ondansetron (ZOFRAN) injection 4 mg (4 mg Intramuscular Given 03/10/14 1036)  sodium chloride 0.9 % bolus 1,000 mL (0 mLs Intravenous Stopped 03/10/14 1302)  promethazine (PHENERGAN) injection 12.5 mg (12.5 mg Intravenous Given 03/10/14 1114)  metoCLOPramide (REGLAN) injection 10 mg (5 mg Intravenous Given 03/10/14 1325)  diphenhydrAMINE (BENADRYL) injection 12.5 mg (12.5 mg Intravenous Given 03/10/14 1324)  meclizine (ANTIVERT) tablet 50 mg (50 mg Oral Given 03/10/14 1452)  ondansetron (ZOFRAN-ODT) disintegrating tablet 8 mg (8 mg Oral Given 03/10/14 1451)  meclizine (ANTIVERT) 12.5 MG tablet (0 mg Oral Duplicate 03/07/64 9935)  LORazepam (ATIVAN) injection 0.5 mg (0.5 mg Intravenous Given 03/10/14 1544)  hyoscyamine (LEVSIN, ANASPAZ) tablet 0.25 mg (0.25 mg Oral Given 03/10/14 1618)      MDM   Final diagnoses:  Gastroenteritis    Pt has been without diarrhea since arrival here but has persisted to have nausea, worse with positional changes.  She also offers at this time that she has had vertigo in the past and has develop slightl dizziness, room spinning with positional changes since arrival here.  Re-exam reveals no nystagmus, nonfocal neuro exam.  Will try meclizine PO.  Pt agreeable.   Pt still with nausea after getting the meclizine but felt better after getting dose of ativan and also maintained PO fluid challenge.  Feels better and ready to go home.  She was not given the levsin ordered as her sx have resolved.  Will prescribe phenergan supp (has zofran at home).  Also will prescribe meclizine as it is possible sx at least partially responded to this medicine.  Encouraged prn f/u with pcp.  Pt was discussed with Dr. Tomi Bamberger while in ed.      Evalee Jefferson, PA-C 03/10/14 [employer], PA-C 03/10/14 1630

## 2014-03-10 NOTE — ED Notes (Signed)
Pt received discharge instructions and prescriptions, verbalized understanding and has no further questions. Pt ambulated to exit in stable condition.  Advised to return to emergency department with new or worsening symptoms.

## 2014-03-10 NOTE — ED Notes (Signed)
Pt had sudden n/v/d around 5am today. Pt states diarrhea has let up some. cbg in route 120. EMS states pt had uncontrollable vomiting upon their arrival. Was given zofran iv en route and helped. Pt arrived to ED alert/oriented. Stated nausea was better. Pt moved from ems stretcher to ED stretcher and became nauseated again with dry heaving. Pt c/o soreness to abd and head from vomiting.

## 2014-03-12 NOTE — ED Provider Notes (Signed)
Medical screening examination/treatment/procedure(s) were performed by non-physician practitioner and as supervising physician I was immediately available for consultation/collaboration.   EKG Interpretation None        Janice Norrie, MD 03/12/14 [phone]

## 2014-08-02 ENCOUNTER — Other Ambulatory Visit (HOSPITAL_COMMUNITY): Payer: Self-pay | Admitting: Internal Medicine

## 2014-08-02 DIAGNOSIS — N949 Unspecified condition associated with female genital organs and menstrual cycle: Secondary | ICD-10-CM

## 2014-08-03 ENCOUNTER — Other Ambulatory Visit (HOSPITAL_COMMUNITY): Payer: Self-pay | Admitting: Internal Medicine

## 2014-08-03 DIAGNOSIS — N949 Unspecified condition associated with female genital organs and menstrual cycle: Secondary | ICD-10-CM

## 2014-08-04 ENCOUNTER — Other Ambulatory Visit (HOSPITAL_COMMUNITY): Payer: Medicare Other

## 2014-08-05 ENCOUNTER — Ambulatory Visit (HOSPITAL_COMMUNITY)
Admission: RE | Admit: 2014-08-05 | Discharge: 2014-08-05 | Disposition: A | Discharge status: Home / Self Care | Payer: Medicare Other | Source: Ambulatory Visit | Attending: Internal Medicine | Admitting: Internal Medicine

## 2014-08-05 DIAGNOSIS — R350 Frequency of micturition: Secondary | ICD-10-CM | POA: Insufficient documentation

## 2014-08-05 DIAGNOSIS — Z9889 Other specified postprocedural states: Secondary | ICD-10-CM | POA: Insufficient documentation

## 2014-08-05 DIAGNOSIS — N949 Unspecified condition associated with female genital organs and menstrual cycle: Secondary | ICD-10-CM | POA: Diagnosis not present

## 2014-09-08 ENCOUNTER — Other Ambulatory Visit (HOSPITAL_COMMUNITY): Payer: Self-pay | Admitting: Internal Medicine

## 2014-09-08 DIAGNOSIS — Z1231 Encounter for screening mammogram for malignant neoplasm of breast: Secondary | ICD-10-CM

## 2014-09-16 ENCOUNTER — Ambulatory Visit (HOSPITAL_COMMUNITY): Payer: Medicare Other

## 2014-09-30 ENCOUNTER — Other Ambulatory Visit (HOSPITAL_COMMUNITY): Payer: Self-pay | Admitting: Internal Medicine

## 2014-09-30 DIAGNOSIS — Z1231 Encounter for screening mammogram for malignant neoplasm of breast: Secondary | ICD-10-CM

## 2014-10-06 ENCOUNTER — Ambulatory Visit (HOSPITAL_COMMUNITY)
Admission: RE | Admit: 2014-10-06 | Discharge: 2014-10-06 | Disposition: A | Discharge status: Home / Self Care | Payer: Medicare Other | Source: Ambulatory Visit | Attending: Internal Medicine | Admitting: Internal Medicine

## 2014-10-06 DIAGNOSIS — Z1231 Encounter for screening mammogram for malignant neoplasm of breast: Secondary | ICD-10-CM

## 2015-07-20 ENCOUNTER — Ambulatory Visit (INDEPENDENT_AMBULATORY_CARE_PROVIDER_SITE_OTHER): Payer: Medicare Other | Admitting: Neurology

## 2015-07-20 ENCOUNTER — Encounter: Payer: Self-pay | Admitting: Neurology

## 2015-07-20 VITALS — BP 126/88 | HR 72 | Resp 16 | Ht 64.0 in | Wt 206.0 lb

## 2015-07-20 DIAGNOSIS — G4719 Other hypersomnia: Secondary | ICD-10-CM

## 2015-07-20 DIAGNOSIS — F1120 Opioid dependence, uncomplicated: Secondary | ICD-10-CM

## 2015-07-20 DIAGNOSIS — G4733 Obstructive sleep apnea (adult) (pediatric): Secondary | ICD-10-CM

## 2015-07-20 DIAGNOSIS — G4761 Periodic limb movement disorder: Secondary | ICD-10-CM

## 2015-07-20 DIAGNOSIS — F1920 Other psychoactive substance dependence, uncomplicated: Secondary | ICD-10-CM

## 2015-07-20 DIAGNOSIS — R351 Nocturia: Secondary | ICD-10-CM

## 2015-07-20 NOTE — Progress Notes (Signed)
Subjective:    Patient ID: Rebekah Henderson is a 60 y.o. female.  HPI     Rebekah Age, MD, PhD [facility] [address] [place], [place] [address]  Dear Dr. Gerarda Henderson,   I saw your patient, Rebekah Henderson, upon your kind request in the neurologic clinic today for initial consultation of her sleep disorder, in particular, concern for underlying obstructive sleep apnea. The patient is accompanied by her best friend today. As you know, Rebekah Henderson is a 34 year old right-handed woman with an underlying medical history of chronic pain, on narcotic pain medication, reflux disease, COPD, hypertension, hypothyroidism, depression and obesity, who reports snoring and excessive daytime somnolence.she has woken herself up with a sense of gasping. She wakes up with a headache often. I reviewed your office note from 02/21/2015, which you kindly included. She had an overnight pulse oximetry test last year through your office which I reviewed: This was overnight on room air on 08/04/2014. Total valid test time was 9 hours and 4 minutes, average oxygen saturation of only 86.3%, time below 89% saturation 8 hours and 10 minutes, lowest oxygen saturation 79%. She was prescribed home oxygen therapy at night at 2 L/m, but never got it.  She reports that she has to take Vicodin 5 or 6 times a day secondary to fibromyalgia pain and arthritis as well as foot pain. In addition, she takes a sleeping pill each night. She quit smoking in 2006 and has a diagnosis of COPD. She feels short of breath easily with minimal exertion. She has gained weight over the course of years. She does not drink caffeine. She does not drink alcohol. She does not have a set bedtime and wake time routine. She lives alone. She has 2 dogs. Bedtime may be around 11:30 and midnight. She sleeps until lunchtime next day often. She does not wake up rested. She goes to the bathroom in the middle of the night several times, up to 7  times a night. She has a family history of obstructive sleep apnea in her sister who died at the Henderson of 25 from congestive heart failure and one brother has obstructive sleep apnea but improved after weight loss surgery. Her Epworth sleepiness score is 12 out of 24 today, her fatigue score is 63 out of 63. She reports residual depression and lack of initiative and motivation and lack of physical activity. While she denies frank restless leg symptoms, she has aching at night. She also twitches in her sleep and has woken herself up with twitching. This happens more when she takes more Vicodin.  Her Past Medical History Is Significant For: Past Medical History  Diagnosis Date  . COPD (chronic obstructive pulmonary disease)   . Hypertension   . Fibromyalgia   . IBS (irritable bowel syndrome)   . GERD (gastroesophageal reflux disease)   . Colon polyps   . Hypothyroidism   . Anxiety   . Arthritis   . PONV (postoperative nausea and vomiting)     pt also states that she had some difficulty breathing after cervical fusion  . Cancer     skin   . Diverticulitis   . Arthritis     Her Past Surgical History Is Significant For: Past Surgical History  Procedure Laterality Date  . Tonsillectomy    . Breast enhancement surgery    . Breast implant removal    . Upper gastrointestinal endoscopy    . Cervical fusion  AUG 2013  .  Colonoscopy  2007 [place]    POLYPS  . Cholecystectomy  1999  . Bladder suspension    . Colonoscopy with propofol N/A 05/25/2013    Procedure: COLONOSCOPY WITH PROPOFOL(at cecum 0957) total withdrawal time=79mn);  Surgeon: SDanie Binder MD;  Location: [facility];  Service: Endoscopy;  Laterality: N/A;  . Esophagogastroduodenoscopy (egd) with propofol N/A 05/25/2013    Procedure: ESOPHAGOGASTRODUODENOSCOPY (EGD) WITH PROPOFOL;  Surgeon: SDanie Binder MD;  Location: [facility];  Service: Endoscopy;  Laterality: N/A;  . Esophageal biopsy N/A 05/25/2013    Procedure: BIOPSIES (Random  Colon; Duodenal; Gastric);  Surgeon: SDanie Binder MD;  Location: [facility];  Service: Endoscopy;  Laterality: N/A;  . Polypectomy N/A 05/25/2013    Procedure: POLYPECTOMY (Rectal and Gastric);  Surgeon: SDanie Binder MD;  Location: [facility];  Service: Endoscopy;  Laterality: N/A;  . Foot surgery      Her Family History Is Significant For: Family History  Problem Relation Henderson of Onset  . Colon cancer Neg Hx   . Colon polyps Neg Hx   . Breast cancer Mother   . Lung cancer Father   . Heart failure Sister   . Diabetes Maternal Grandfather     Her Social History Is Significant For: Social History   Social History  . Marital Status: Divorced    Spouse Name: N/A  . Number of Children: N/A  . Years of Education: College    Occupational History  . Retired     Social History Main Topics  . Smoking status: Former Smoker -- 2.00 packs/day for 20 years    Quit date: 05/20/2004  . Smokeless tobacco: None  . Alcohol Use: No  . Drug Use: No  . Sexual Activity: Not Asked   Other Topics Concern  . None   Social History Narrative   Denies caffeine use     Her Allergies Are:  Allergies  Allergen Reactions  . Penicillins Hives and Swelling  . Codeine Nausea Only  . Ranitidine Hcl Other (See Comments)    dizzy  . Keflex [Cephalexin]   . Lyrica [Pregabalin]   :   Her Current Medications Are:  Outpatient Encounter Prescriptions as of 07/20/2015  Medication Sig  . albuterol (PROVENTIL) 2 MG tablet Take 2 mg by mouth daily as needed for wheezing or shortness of breath.   .Marland KitchenamLODipine (NORVASC) 5 MG tablet Take 5 mg by mouth daily.    .Marland Kitchenaspirin EC 81 MG tablet Take 81 mg by mouth daily.  .Marland Kitchenestazolam (PROSOM) 2 MG tablet Take 2 mg by mouth at bedtime.  .Marland Kitchenestradiol (ESTRACE) 2 MG tablet Take 2 mg by mouth daily.    .Marland KitchenFLUoxetine (PROZAC) 40 MG capsule   . HYDROcodone-acetaminophen (NORCO/VICODIN) 5-325 MG per tablet Take 1 tablet by mouth every 6 (six) hours as needed for pain.   .Marland Kitchen levothyroxine (SYNTHROID, LEVOTHROID) 25 MCG tablet Take 25 mcg by mouth daily.    . metoprolol succinate (TOPROL-XL) 25 MG 24 hr tablet Take 25 mg by mouth daily.  . ondansetron (ZOFRAN) 4 MG tablet Take 1 tablet (4 mg total) by mouth every 6 (six) hours.  . pantoprazole (PROTONIX) 40 MG tablet Take 40 mg by mouth daily.    .Marland KitchenPROAIR HFA 108 (90 BASE) MCG/ACT inhaler   . triamterene-hydrochlorothiazide (DYAZIDE) 37.5-25 MG per capsule Take 1 capsule by mouth every morning.    . [DISCONTINUED] dicyclomine (BENTYL) 20 MG tablet Take 20 mg by mouth every 6 (six)  hours.  . [DISCONTINUED] DULoxetine (CYMBALTA) 60 MG capsule Take 60 mg by mouth daily.   No facility-administered encounter medications on file as of 07/20/2015.  :  Review of Systems:  Out of a complete 14 point review of systems, all are reviewed and negative with the exception of these symptoms as listed below:   Review of Systems  Constitutional: Positive for fatigue.       Weight gain   HENT: Positive for trouble swallowing.   Eyes:       Blurred vision   Cardiovascular: Positive for leg swelling.       Murmur  Endocrine: Positive for polydipsia.       Feeling hot   Musculoskeletal:       Joint pain and swelling, cramps, aching muscles   Neurological: Positive for dizziness, weakness, numbness and headaches.       Restless legs, memory loss, has trouble falling asleep without medication, trouble staying asleep, wakes up choking or short of breath, wakes up feeling tired in the morning, morning headaches, takes naps during the day.   Hematological: Bruises/bleeds easily.  Psychiatric/Behavioral:       Depression, anxiety, not enough sleep, decreased energy, disinterest in activities     Objective:  Neurologic Exam  Physical Exam Physical Examination:   Filed Vitals:   07/20/15 1421  BP: 126/88  Pulse: 72  Resp: 16   General Examination: The patient is a very pleasant 59 y.o. female in no acute distress. She  appears well-developed and well-nourished and well groomed. She is mildly depressed appearing.  HEENT: Normocephalic, atraumatic, pupils are equal, round and reactive to light and accommodation. Funduscopic exam is normal with sharp disc margins noted. Extraocular tracking is good without limitation to gaze excursion or nystagmus noted. Normal smooth pursuit is noted. Hearing is grossly intact. Tympanic membranes are clear bilaterally. Face is symmetric with normal facial animation and normal facial sensation. Speech is clear with no dysarthria noted. There is no hypophonia. There is no lip, neck/head, jaw or voice tremor. Neck is supple with full range of passive and active motion. There are no carotid bruits on auscultation. Oropharynx exam reveals: mild mouth dryness, adequate dental hygiene and moderate airway crowding, due to narrow airway entry and redundant soft palate, slightly elongated uvula. Mallampati is class II. Tongue protrudes centrally and palate elevates symmetrically. Tonsils are absent. Neck size is 15-3/4 inches.   Chest: Clear to auscultation without wheezing, rhonchi or crackles noted.  Heart: S1+S2+0, regular and normal without murmurs, rubs or gallops noted.   Abdomen: Soft, non-tender and non-distended with normal bowel sounds appreciated on auscultation.  Extremities: There is no pitting edema in the distal lower extremities bilaterally. Pedal pulses are intact.  Skin: is very dry with dry and patchy lesions noted. She had a skin cancer removed from her right foot.  Musculoskeletal: exam reveals no obvious joint deformities, tenderness or joint swelling or erythema.   Neurologically:  Mental status: The patient is awake, alert and oriented in all 4 spheres. Her immediate and remote memory, attention, language skills and fund of knowledge are appropriate. There is no evidence of aphasia, agnosia, apraxia or anomia. Speech is clear with normal prosody and enunciation.  Thought process is linear. Mood is depressed and affect is blunted.  Cranial nerves II - XII are as described above under HEENT exam. In addition: shoulder shrug is normal with equal shoulder height noted. Motor exam: Normal bulk, strength and tone is noted. There is no drift, tremor  or rebound. Romberg is negative. Reflexes are 2+ throughout. Babinski: Toes are flexor bilaterally. Fine motor skills and coordination: intact with normal finger taps, normal hand movements, normal rapid alternating patting, normal foot taps and normal foot agility.  Cerebellar testing: No dysmetria or intention tremor on finger to nose testing. Heel to shin is unremarkable bilaterally. There is no truncal or gait ataxia.  Sensory exam: intact to light touch, pinprick, vibration, temperature sense in the upper and lower extremities.  Gait, station and balance: She stands with difficulty. No veering to one side is noted. No leaning to one side is noted. Posture is Henderson-appropriate and stance is narrow based. Gait shows slow and cautious gait. She turns slowly.  Assessment and Plan:  In summary, Mozell W Zhao is a very pleasant 59 y.o.-year old female with an underlying medical history of chronic pain, on narcotic pain medication, reflux disease, COPD, hypertension, hypothyroidism, depression and obesity, who reports snoring and excessive daytime somnolence.she has woken herself up with a sense of gasping. She had a significantly abnormal overnight pulse oximetry test last year which we reviewed together. Low oxygen saturations may have several reasons in her case including previous smoking, history of COPD, taking sedating medications and overweight state. Her history and physical exam are also concerning for underlying obstructive sleep apnea (OSA). I had a long chat with the patient and her friend about my findings and the diagnosis of OSA, its prognosis and treatment options. We talked about medical treatments, surgical  interventions and non-pharmacological approaches. I explained in particular the risks and ramifications of untreated moderate to severe OSA, especially with respect to developing cardiovascular disease down the Road, including congestive heart failure, difficult to treat hypertension, cardiac arrhythmias, or stroke. Even type 2 diabetes has, in part, been linked to untreated OSA. Symptoms of untreated OSA include daytime sleepiness, memory problems, mood irritability and mood disorder such as depression and anxiety, lack of energy, as well as recurrent headaches, especially morning headaches. We talked about trying to maintain a healthy lifestyle in general, as well as the importance of weight control. I encouraged the patient to eat healthy, exercise daily and keep well hydrated, to keep a scheduled bedtime and wake time routine, to not skip any meals and eat healthy snacks in between meals. I advised the patient not to drive when feeling sleepy. I recommended the following at this time: sleep study with potential positive airway pressure titration. (We will score hypopneas at 4% and split the sleep study into diagnostic and treatment portion, if the estimated. 2 hour AHI is >15/h).   I explained the sleep test procedure to the patient and also outlined possible surgical and non-surgical treatment options of OSA, including the use of a custom-made dental device (which would require a referral to a specialist dentist or oral surgeon), upper airway surgical options, such as pillar implants, radiofrequency surgery, tongue base surgery, and UPPP (which would involve a referral to an ENT surgeon). Rarely, jaw surgery such as mandibular advancement may be considered.  I also explained the CPAP treatment option to the patient, who indicated that she would be willing to try CPAP if the need arises. I explained the importance of being compliant with PAP treatment, not only for insurance purposes but primarily to improve  Her symptoms, and for the patient's long term health benefit, including to reduce Her cardiovascular risks. I answered all her questions today and the patient was in agreement. I would like to see her back after the sleep  study is completed and encouraged her to call with any interim questions, concerns, problems or updates.   Thank you very much for allowing me to participate in the care of this nice patient. If I can be of any further assistance to you please do not hesitate to call me at [phone].  Sincerely,   Rebekah Age, MD, PhD

## 2015-07-20 NOTE — Patient Instructions (Signed)

## 2015-08-22 ENCOUNTER — Telehealth: Payer: Self-pay | Admitting: Neurology

## 2015-08-22 NOTE — Telephone Encounter (Signed)
LVM for patient to call to reschedule sleep study

## 2015-09-04 ENCOUNTER — Ambulatory Visit (INDEPENDENT_AMBULATORY_CARE_PROVIDER_SITE_OTHER): Payer: Medicare Other | Admitting: Neurology

## 2015-09-04 DIAGNOSIS — G4733 Obstructive sleep apnea (adult) (pediatric): Secondary | ICD-10-CM | POA: Diagnosis not present

## 2015-09-04 DIAGNOSIS — R0683 Snoring: Secondary | ICD-10-CM

## 2015-09-04 DIAGNOSIS — G4734 Idiopathic sleep related nonobstructive alveolar hypoventilation: Secondary | ICD-10-CM

## 2015-09-04 DIAGNOSIS — G4720 Circadian rhythm sleep disorder, unspecified type: Secondary | ICD-10-CM

## 2015-09-05 NOTE — Sleep Study (Signed)
Please see the scanned sleep study interpretation located in the Procedure tab within the Chart Review section. 

## 2015-09-08 ENCOUNTER — Telehealth: Payer: Self-pay | Admitting: Neurology

## 2015-09-08 NOTE — Telephone Encounter (Signed)
Patient referred by Dr. Gerarda Fraction, seen by me on 07/20/15, diagnostic PSG on 09/04/15.   Please call and notify the patient that the recent sleep study did not show any significant obstructive sleep apnea, but her oxygen saturations were abnormally low, as demonstrated by the abnormal overnight pulse ox last year, when Dr. Gerarda Fraction ordered it. From what I remember, he had ordered oxygen therapy at night, but she never started it. My recommendation is that she discuss with Dr. Gerarda Fraction a referral to a lung doctor (pulmonologist) so an underlying lung disease may be investigated. Please inform patient that we can go over the details of the study during a follow up appointment. Arrange a followup appointment and route or fax report to PCP and referring MD, if other than PCP.  Once you have spoken to patient, you can close this encounter.   Thanks,  Star Age, MD, PhD [facility])

## 2015-09-11 NOTE — Telephone Encounter (Signed)
Left message to call back  

## 2015-09-12 NOTE — Telephone Encounter (Signed)
I spoke to patient and gave her results and recommendations. She will talk to PCP about O2 desaturations and I will fax report to PCP. She made a f/u appt next week to discuss sleep study.

## 2015-09-12 NOTE — Telephone Encounter (Signed)
Pt called returning Diana's call. Pt can be reached at [phone]

## 2015-09-20 ENCOUNTER — Ambulatory Visit (INDEPENDENT_AMBULATORY_CARE_PROVIDER_SITE_OTHER): Payer: Medicare Other | Admitting: Neurology

## 2015-09-20 ENCOUNTER — Encounter: Payer: Self-pay | Admitting: Neurology

## 2015-09-20 VITALS — BP 132/86 | HR 72 | Resp 18 | Ht 64.0 in | Wt 201.0 lb

## 2015-09-20 DIAGNOSIS — E669 Obesity, unspecified: Secondary | ICD-10-CM

## 2015-09-20 DIAGNOSIS — F1120 Opioid dependence, uncomplicated: Secondary | ICD-10-CM | POA: Diagnosis not present

## 2015-09-20 DIAGNOSIS — G4734 Idiopathic sleep related nonobstructive alveolar hypoventilation: Secondary | ICD-10-CM | POA: Diagnosis not present

## 2015-09-20 NOTE — Progress Notes (Signed)
Subjective:    Patient ID: Rebekah Henderson is a 59 y.o. female.  HPI     Interim history:   Rebekah Henderson is a 59 year old right-handed woman with an underlying medical history of chronic pain, on narcotic pain medication, reflux disease, COPD, hypertension, hypothyroidism, depression and obesity, who presents for follow-up consultation after her recent sleep study. The patient is accompanied by her friend again today. I first met her on 07/20/2015, at which time she was referred by her primary care physician and reported a history of snoring and excessive daytime somnolence as well as morning headaches and a waking up with a sense of gasping for air. I invited her back for sleep study. She had a baseline sleep study on 09/04/2015 underwent over her test results with her in detail today. Sleep efficiency was reduced at 56.2% with a latency to sleep of 92.5 minutes and wake after sleep onset of 75.5 minutes with mild to moderate sleep fragmentation noted. She had to longer periods of wakefulness. Arousal index was normal. She had a markedly increased percentage of stage II sleep and absence of slow-wave sleep as well as near absence of REM sleep essentially at only 0.5%. She had no significant PLMS, EKG or EEG changes. She had mild intermittent snoring. Total AHI was 0.8 per hour, average oxygen saturations however only 84% and nadir was 80%. Of note, she had a previous abnormal overnight pulse oximetry test last year through her primary care physician's office. She was supposed to start home oxygen therapy.  Today, 09/20/2015: She reports no significant changes in her symptoms. She has some trouble falling asleep and staying asleep at home. She has been taking hydrocodone 4-6 times on an average day. Her primary care physician did prescribe oxygen therapy at home but she has not started yet as it has not been delivered yet. They did talk about her seeing a lung doctor as well. She sometimes has shortness of  breath and wheezing. She has had occasional chest pains. She does not exercise very much. She has lost a few pounds. She does not always drink enough water.  Previously:  07/20/2015: She reports snoring and excessive daytime somnolence.she has woken herself up with a sense of gasping. She wakes up with a headache often. I reviewed your office note from 02/21/2015, which you kindly included. She had an overnight pulse oximetry test last year through your office which I reviewed: This was overnight on room air on 08/04/2014. Total valid test time was 9 hours and 4 minutes, average oxygen saturation of only 86.3%, time below 89% saturation 8 hours and 10 minutes, lowest oxygen saturation 79%. She was prescribed home oxygen therapy at night at 2 L/m, but never got it.   She reports that she has to take Vicodin 5 or 6 times a day secondary to fibromyalgia pain and arthritis as well as foot pain. In addition, she takes a sleeping pill each night. She quit smoking in 2006 and has a diagnosis of COPD. She feels short of breath easily with minimal exertion. She has gained weight over the course of years. She does not drink caffeine. She does not drink alcohol. She does not have a set bedtime and wake time routine. She lives alone. She has 2 dogs. Bedtime may be around 11:30 and midnight. She sleeps until lunchtime next day often. She does not wake up rested. She goes to the bathroom in the middle of the night several times, up to 7 times a night.  She has a family history of obstructive sleep apnea in her sister who died at the age of 60 from congestive heart failure and one brother has obstructive sleep apnea but improved after weight loss surgery. Her Epworth sleepiness score is 12 out of 24 today, her fatigue score is 63 out of 63. She reports residual depression and lack of initiative and motivation and lack of physical activity. While she denies frank restless leg symptoms, she has aching at night. She also twitches  in her sleep and has woken herself up with twitching. This happens more when she takes more Vicodin.  Her Past Medical History Is Significant For: Past Medical History  Diagnosis Date  . COPD (chronic obstructive pulmonary disease) ([place])   . Hypertension   . Fibromyalgia   . IBS (irritable bowel syndrome)   . GERD (gastroesophageal reflux disease)   . Colon polyps   . Hypothyroidism   . Anxiety   . Arthritis   . PONV (postoperative nausea and vomiting)     pt also states that she had some difficulty breathing after cervical fusion  . Cancer ([place])     skin   . Diverticulitis   . Arthritis     Her Past Surgical History Is Significant For: Past Surgical History  Procedure Laterality Date  . Tonsillectomy    . Breast enhancement surgery    . Breast implant removal    . Upper gastrointestinal endoscopy    . Cervical fusion  AUG 2013  . Colonoscopy  2007 [place]    POLYPS  . Cholecystectomy  1999  . Bladder suspension    . Colonoscopy with propofol N/A 05/25/2013    Procedure: COLONOSCOPY WITH PROPOFOL(at cecum 0957) total withdrawal time=30mn);  Surgeon: SDanie Binder MD;  Location: [facility];  Service: Endoscopy;  Laterality: N/A;  . Esophagogastroduodenoscopy (egd) with propofol N/A 05/25/2013    Procedure: ESOPHAGOGASTRODUODENOSCOPY (EGD) WITH PROPOFOL;  Surgeon: SDanie Binder MD;  Location: [facility];  Service: Endoscopy;  Laterality: N/A;  . Esophageal biopsy N/A 05/25/2013    Procedure: BIOPSIES (Random Colon; Duodenal; Gastric);  Surgeon: SDanie Binder MD;  Location: [facility];  Service: Endoscopy;  Laterality: N/A;  . Polypectomy N/A 05/25/2013    Procedure: POLYPECTOMY (Rectal and Gastric);  Surgeon: SDanie Binder MD;  Location: [facility];  Service: Endoscopy;  Laterality: N/A;  . Foot surgery      Her Family History Is Significant For: Family History  Problem Relation Age of Onset  . Colon cancer Neg Hx   . Colon polyps Neg Hx   . Breast cancer Mother   . Lung cancer  Father   . Heart failure Sister   . Diabetes Maternal Grandfather     Her Social History Is Significant For: Social History   Social History  . Marital Status: Divorced    Spouse Name: N/A  . Number of Children: N/A  . Years of Education: College    Occupational History  . Retired     Social History Main Topics  . Smoking status: Former Smoker -- 2.00 packs/day for 20 years    Quit date: 05/20/2004  . Smokeless tobacco: None  . Alcohol Use: No  . Drug Use: No  . Sexual Activity: Not Asked   Other Topics Concern  . None   Social History Narrative   Denies caffeine use     Her Allergies Are:  Allergies  Allergen Reactions  . Penicillins Hives and Swelling  . Codeine Nausea Only  .  Ranitidine Hcl Other (See Comments)    dizzy  . Keflex [Cephalexin]   . Lyrica [Pregabalin]   :   Her Current Medications Are:  Outpatient Encounter Prescriptions as of 09/20/2015  Medication Sig  . albuterol (PROVENTIL) 2 MG tablet Take 2 mg by mouth daily as needed for wheezing or shortness of breath.   Marland Kitchen amLODipine (NORVASC) 5 MG tablet Take 5 mg by mouth daily.    Marland Kitchen aspirin EC 81 MG tablet Take 81 mg by mouth daily.  Marland Kitchen estazolam (PROSOM) 2 MG tablet Take 2 mg by mouth at bedtime.  Marland Kitchen estradiol (ESTRACE) 2 MG tablet Take 2 mg by mouth daily.    Marland Kitchen FLUoxetine (PROZAC) 40 MG capsule   . HYDROcodone-acetaminophen (NORCO/VICODIN) 5-325 MG per tablet Take 1 tablet by mouth every 6 (six) hours as needed for pain.   Marland Kitchen levothyroxine (SYNTHROID, LEVOTHROID) 25 MCG tablet Take 25 mcg by mouth daily.    . metoprolol succinate (TOPROL-XL) 25 MG 24 hr tablet Take 25 mg by mouth daily.  . ondansetron (ZOFRAN) 4 MG tablet Take 1 tablet (4 mg total) by mouth every 6 (six) hours.  . pantoprazole (PROTONIX) 40 MG tablet Take 40 mg by mouth daily.    Marland Kitchen PROAIR HFA 108 (90 BASE) MCG/ACT inhaler   . triamterene-hydrochlorothiazide (DYAZIDE) 37.5-25 MG per capsule Take 1 capsule by mouth every morning.      No facility-administered encounter medications on file as of 09/20/2015.  :  Review of Systems:  Out of a complete 14 point review of systems, all are reviewed and negative with the exception of these symptoms as listed below:   Review of Systems  Neurological:       Patient is here to discuss sleep study, no new concerns. PCP just put patient on O2 at night, she has not started yet.     Objective:  Neurologic Exam  Physical Exam Physical Examination:   Filed Vitals:   09/20/15 1155  BP: 132/86  Pulse: 72  Resp: 18   General Examination: The patient is a very pleasant 59 y.o. female in no acute distress. She appears well-developed and well-nourished and well groomed. She is in good spirits today.   HEENT: Normocephalic, atraumatic, pupils are equal, round and reactive to light and accommodation. Extraocular tracking is good without limitation to gaze excursion or nystagmus noted. Normal smooth pursuit is noted. Hearing is grossly intact. Face is symmetric with normal facial animation and normal facial sensation. Speech is clear with no dysarthria noted. There is no hypophonia. There is no lip, neck/head, jaw or voice tremor. Neck is supple with full range of passive and active motion. There are no carotid bruits on auscultation. Oropharynx exam reveals: mild mouth dryness, adequate dental hygiene and moderate airway crowding, due to narrow airway entry and redundant soft palate, slightly elongated uvula. Mallampati is class II. Tongue protrudes centrally and palate elevates symmetrically. Tonsils are absent.   Chest: Clear to auscultation without wheezing, rhonchi or crackles noted.  Heart: S1+S2+0, regular and normal without murmurs, rubs or gallops noted.   Abdomen: Soft, non-tender and non-distended with normal bowel sounds appreciated on auscultation.  Extremities: There is no pitting edema in the distal lower extremities bilaterally. Pedal pulses are intact.  Skin: is very  dry with dry and patchy lesions noted.   Musculoskeletal: exam reveals no obvious joint deformities, tenderness or joint swelling or erythema.   Neurologically:  Mental status: The patient is awake, alert and oriented in all 4  spheres. Her immediate and remote memory, attention, language skills and fund of knowledge are appropriate. There is no evidence of aphasia, agnosia, apraxia or anomia. Speech is clear with normal prosody and enunciation. Thought process is linear. Mood and affect are normal.   Cranial nerves II - XII are as described above under HEENT exam. In addition: shoulder shrug is normal with equal shoulder height noted. Motor exam: Normal bulk, strength and tone is noted. There is no drift, tremor or rebound. Romberg is negative. Reflexes are 1+ throughout. Fine motor skills and coordination are intact.  Sensory exam: intact to light touch the upper and lower extremities.  Gait, station and balance: She stands with difficulty. No veering to one side is noted. No leaning to one side is noted. Posture is age-appropriate and stance is narrow based. Gait shows slow and cautious gait. She turns slowly.  Assessment and Plan:  In summary, Charlie W Riera is a very pleasant 59 year old female with an underlying medical history of chronic pain, on narcotic pain medication, reflux disease, COPD, hypertension, hypothyroidism, depression and obesity, who presents for follow up after her recent sleep study earlier this month. She is advised that her sleep study did not show any significant obstructive sleep apnea. She did not sleep very well. We talked about sleep hygiene quite a bit today. She has low oxygen saturations. In her case it could be related to prior long-standing history of smoking, history of chronic lung disease, taking sedating medications including narcotics which can lower respiratory drive. She is advised to talk to her primary care physician about potentially seeing a lung specialist  and also about trying to reduce her narcotics. At this juncture, she is advised for her sleep to maintain good sleep hygiene and to try to lose weight. She is advised to drink more water. I can see her back on an as-needed basis. I answered all her questions today and the patient was in agreement. I spent 20 minutes in total face-to-face time with the patient, more than 50% of which was spent in counseling and coordination of care, reviewing test results, reviewing medication and discussing or reviewing the diagnosis of nocturnal hypoxemia, its prognosis and treatment options.

## 2015-09-20 NOTE — Patient Instructions (Signed)
Consider seeing a lung specialist, talk to Dr. Gerarda Fraction about it.  Your sleep study did not show obstructive sleep apnea, which is reassuring. Try to lose weight, you have lost about 5 lb. Increase your water intake and talk to Dr. Gerarda Fraction about reducing your narcotic pain medication as it can reduce the respiratory drive.

## 2015-10-02 ENCOUNTER — Other Ambulatory Visit (HOSPITAL_COMMUNITY): Payer: Self-pay | Admitting: Internal Medicine

## 2015-10-02 ENCOUNTER — Ambulatory Visit (HOSPITAL_COMMUNITY)
Admission: RE | Admit: 2015-10-02 | Discharge: 2015-10-02 | Disposition: A | Discharge status: Home / Self Care | Payer: Medicare Other | Source: Ambulatory Visit | Attending: Internal Medicine | Admitting: Internal Medicine

## 2015-10-02 DIAGNOSIS — R05 Cough: Secondary | ICD-10-CM

## 2015-10-02 DIAGNOSIS — R0602 Shortness of breath: Secondary | ICD-10-CM

## 2015-10-02 DIAGNOSIS — R079 Chest pain, unspecified: Secondary | ICD-10-CM

## 2015-10-02 DIAGNOSIS — R059 Cough, unspecified: Secondary | ICD-10-CM

## 2015-10-25 ENCOUNTER — Telehealth: Payer: Self-pay | Admitting: Cardiology

## 2015-10-25 NOTE — Telephone Encounter (Signed)
Received records from [facility] for appointment on 11/09/15 with Dr Percival Spanish.  Records given to [facility] (medical records) for Dr Hochrein's schedule on 11/09/15.  lp

## 2015-11-07 DIAGNOSIS — C801 Malignant (primary) neoplasm, unspecified: Secondary | ICD-10-CM | POA: Insufficient documentation

## 2015-11-07 DIAGNOSIS — R112 Nausea with vomiting, unspecified: Secondary | ICD-10-CM | POA: Insufficient documentation

## 2015-11-07 DIAGNOSIS — M797 Fibromyalgia: Secondary | ICD-10-CM | POA: Insufficient documentation

## 2015-11-07 DIAGNOSIS — K219 Gastro-esophageal reflux disease without esophagitis: Secondary | ICD-10-CM | POA: Insufficient documentation

## 2015-11-07 DIAGNOSIS — I10 Essential (primary) hypertension: Secondary | ICD-10-CM | POA: Insufficient documentation

## 2015-11-07 DIAGNOSIS — M1990 Unspecified osteoarthritis, unspecified site: Secondary | ICD-10-CM | POA: Insufficient documentation

## 2015-11-07 DIAGNOSIS — K589 Irritable bowel syndrome without diarrhea: Secondary | ICD-10-CM | POA: Insufficient documentation

## 2015-11-07 DIAGNOSIS — Z9889 Other specified postprocedural states: Secondary | ICD-10-CM

## 2015-11-07 DIAGNOSIS — J449 Chronic obstructive pulmonary disease, unspecified: Secondary | ICD-10-CM | POA: Insufficient documentation

## 2015-11-07 DIAGNOSIS — F419 Anxiety disorder, unspecified: Secondary | ICD-10-CM | POA: Insufficient documentation

## 2015-11-07 DIAGNOSIS — E039 Hypothyroidism, unspecified: Secondary | ICD-10-CM | POA: Insufficient documentation

## 2015-11-07 DIAGNOSIS — K5792 Diverticulitis of intestine, part unspecified, without perforation or abscess without bleeding: Secondary | ICD-10-CM | POA: Insufficient documentation

## 2015-11-07 DIAGNOSIS — K635 Polyp of colon: Secondary | ICD-10-CM | POA: Insufficient documentation

## 2015-11-09 ENCOUNTER — Encounter: Payer: Self-pay | Admitting: Cardiology

## 2015-11-09 ENCOUNTER — Ambulatory Visit (INDEPENDENT_AMBULATORY_CARE_PROVIDER_SITE_OTHER): Payer: Medicare Other | Admitting: Cardiology

## 2015-11-09 VITALS — BP 128/88 | HR 69 | Ht 63.0 in | Wt 204.4 lb

## 2015-11-09 DIAGNOSIS — R0602 Shortness of breath: Secondary | ICD-10-CM

## 2015-11-09 DIAGNOSIS — I7789 Other specified disorders of arteries and arterioles: Secondary | ICD-10-CM

## 2015-11-09 DIAGNOSIS — R0789 Other chest pain: Secondary | ICD-10-CM | POA: Diagnosis not present

## 2015-11-09 NOTE — Progress Notes (Signed)
Cardiology Office Note   Date:  11/09/2015   ID:  Rebekah Henderson 14-Dec-1955, MRN [id number]  PCP:  Glo Herring., MD  Cardiologist:   Minus Breeding, MD   Chief Complaint  Patient presents with  . Chest Pain  . Shortness of Breath      History of Present Illness: Rebekah Henderson is a 59 y.o. female who presents for evaluation of chest discomfort and shortness of breath. The patients history includes a cardiac cath by Dr. Tami Ribas in 2003 with mildly dilated aortic root but no CAD.   This was done to evaluate chest discomfort. She's had no other cardiac workup since then. She has had shortness of breath for some time. This happens with just about any activity but not reproducibly. It might happen also at rest at night. At times when she gets short of breath she also gets some sharp chest discomfort. This is different than reflux. He might last for a few minutes. There is no radiation. It is mid chest. It goes away spontaneously. It is moderate in intensity. Besides the shortness of breath there is no associated symptoms. It is not made worse with movement or deep breathing. She will take an extra aspirin occasionally. She does not think it's increasing severity though it's probably more frequent. She has increasing dyspnea with activities such as walking the dog. She's not really describing PND or orthopnea however.  Past Medical History  Diagnosis Date  . COPD (chronic obstructive pulmonary disease) ([place])   . Hypertension   . Fibromyalgia   . IBS (irritable bowel syndrome)   . GERD (gastroesophageal reflux disease)   . Colon polyps   . Hypothyroidism   . Anxiety   . Arthritis   . PONV (postoperative nausea and vomiting)     pt also states that she had some difficulty breathing after cervical fusion  . Cancer ([place])     skin   . Diverticulitis   . Arthritis     Past Surgical History  Procedure Laterality Date  . Tonsillectomy    . Breast enhancement surgery    . Breast  implant removal    . Upper gastrointestinal endoscopy    . Cervical fusion  AUG 2013  . Colonoscopy  2007 [place]    POLYPS  . Cholecystectomy  1999  . Bladder suspension    . Colonoscopy with propofol N/A 05/25/2013    Procedure: COLONOSCOPY WITH PROPOFOL(at cecum 0957) total withdrawal time=29mn);  Surgeon: SDanie Binder MD;  Location: [facility];  Service: Endoscopy;  Laterality: N/A;  . Esophagogastroduodenoscopy (egd) with propofol N/A 05/25/2013    Procedure: ESOPHAGOGASTRODUODENOSCOPY (EGD) WITH PROPOFOL;  Surgeon: SDanie Binder MD;  Location: [facility];  Service: Endoscopy;  Laterality: N/A;  . Esophageal biopsy N/A 05/25/2013    Procedure: BIOPSIES (Random Colon; Duodenal; Gastric);  Surgeon: SDanie Binder MD;  Location: [facility];  Service: Endoscopy;  Laterality: N/A;  . Polypectomy N/A 05/25/2013    Procedure: POLYPECTOMY (Rectal and Gastric);  Surgeon: SDanie Binder MD;  Location: [facility];  Service: Endoscopy;  Laterality: N/A;  . Foot surgery       Current Outpatient Prescriptions  Medication Sig Dispense Refill  . albuterol (PROVENTIL) 2 MG tablet Take 2 mg by mouth daily as needed for wheezing or shortness of breath.     .Marland KitchenamLODipine (NORVASC) 5 MG tablet Take 5 mg by mouth daily.      .Marland Kitchenaspirin EC 81 MG tablet Take  81 mg by mouth daily.    . ciprofloxacin (CIPRO) 500 MG tablet Take 1 tablet by mouth 2 (two) times daily.  0  . estazolam (PROSOM) 2 MG tablet Take 2 mg by mouth at bedtime.    Marland Kitchen estradiol (ESTRACE) 2 MG tablet Take 2 mg by mouth daily.      Marland Kitchen FLUoxetine (PROZAC) 40 MG capsule   2  . HYDROcodone-acetaminophen (NORCO/VICODIN) 5-325 MG per tablet Take 1 tablet by mouth every 6 (six) hours as needed for pain.     Marland Kitchen levothyroxine (SYNTHROID, LEVOTHROID) 25 MCG tablet Take 25 mcg by mouth daily.      . metoprolol succinate (TOPROL-XL) 25 MG 24 hr tablet Take 25 mg by mouth daily.    . ondansetron (ZOFRAN) 4 MG tablet Take 1 tablet (4 mg total) by mouth every 6 (six)  hours. 12 tablet 0  . pantoprazole (PROTONIX) 40 MG tablet Take 40 mg by mouth daily.      Marland Kitchen PROAIR HFA 108 (90 BASE) MCG/ACT inhaler   0  . triamterene-hydrochlorothiazide (DYAZIDE) 37.5-25 MG per capsule Take 1 capsule by mouth every morning.       No current facility-administered medications for this visit.    Allergies:   Penicillins; Codeine; Ranitidine hcl; Keflex; and Lyrica    Social History:  The patient  reports that she quit smoking about 11 years ago. She does not have any smokeless tobacco history on file. She reports that she does not drink alcohol or use illicit drugs.   Family History:  [name]; [name]; [name]; [name] There is no history of Colon cancer or Colon polyps.    ROS:  Please see the history of present illness.   Otherwise, review of systems are positive for none.   All other systems are reviewed and negative.    PHYSICAL EXAM: VS:  BP 128/88 mmHg  Pulse 69  Ht '5\' 3"'$  (1.6 m)  Wt 204 lb 6.4 oz (92.715 kg)  BMI 36.22 kg/m2 , BMI Body mass index is 36.22 kg/(m^2). GENERAL:  Well appearing HEENT:  Pupils equal round and reactive, fundi not visualized, oral mucosa unremarkable NECK:  No jugular venous distention, waveform within normal limits, carotid upstroke brisk and symmetric, no bruits, no thyromegaly LYMPHATICS:  No cervical, inguinal adenopathy LUNGS:  Clear to auscultation bilaterally BACK:  No CVA tenderness CHEST:  Unremarkable HEART:  PMI not displaced or sustained,S1 and S2 within normal limits, no S3, no S4, no clicks, no rubs, no murmurs ABD:  Flat, positive bowel sounds normal in frequency in pitch, no bruits, no rebound, no guarding, no midline pulsatile mass, no hepatomegaly, no splenomegaly EXT:  2 plus pulses throughout, no edema, no cyanosis no clubbing SKIN:  No rashes no nodules NEURO:   Cranial nerves II through XII grossly intact, motor grossly intact throughout PSYCH:  Cognitively intact, oriented to person place and time    EKG:  EKG is ordered today. The ekg ordered today demonstrates sinus rhythm, rate 69, axis within normal limits, QTC slightly prolonged, nonspecific anterior T-wave flattening, poor anterior R wave progression,   Recent Labs: No results found for requested labs within last 365 days.    Lipid Panel    Component Value Date/Time   CHOL  12/09/2007 0220    153        ATP III CLASSIFICATION:  <  200     mg/dL   Desirable  200-239  mg/dL   Borderline High  >=240    mg/dL   High          TRIG 288* 12/09/2007 0220   HDL 63 12/09/2007 0220   CHOLHDL 2.4 12/09/2007 0220   VLDL 58* 12/09/2007 0220   LDLCALC  12/09/2007 0220    32        Total Cholesterol/HDL:CHD Risk Coronary Heart Disease Risk Table                     Men   Women  1/2 Average Risk   3.4   3.3  Average Risk       5.0   4.4  2 X Average Risk   9.6   7.1  3 X Average Risk  23.4   11.0        Use the calculated Patient Ratio above and the CHD Risk Table to determine the patient's CHD Risk.        ATP III CLASSIFICATION (LDL):  <100     mg/dL   Optimal  100-129  mg/dL   Near or Above                    Optimal  130-159  mg/dL   Borderline  160-189  mg/dL   High  >190     mg/dL   Very High      Wt Readings from Last 3 Encounters:  11/09/15 204 lb 6.4 oz (92.715 kg)  09/20/15 201 lb (91.173 kg)  07/20/15 206 lb (93.441 kg)      Other studies Reviewed: Additional studies/ records that were reviewed today include: Cath 2003 report, Office records Dr. Gerarda Fraction. Review of the above records demonstrates:  Please see elsewhere in the note.     ASSESSMENT AND PLAN:  CHEST PAIN:  Her chest pain is somewhat atypical. However, given her risk factors she needs stress testing. However, she wouldn't be a walk on a treadmill. Therefore, she will have a [employer].     DYSPNEA:    She does have apparently some lung disease and she is now wearing oxygen at night. She's had a sleep study if she's going to have pulmonary follow-up of this. I'm going to check a BNP level.  AORTIC ROOT ENLARGEMENT:  This was noted previously on cardiac catheterization but has had no follow-up. I will check an echo to follow-up on this.   Current medicines are reviewed at length with the patient today.  The patient does not have concerns regarding medicines.  The following changes have been made:  no change  Labs/ tests ordered today include:   Orders Placed This Encounter  Procedures  . B Nat Peptide  . Myocardial Perfusion Imaging  . EKG 12-Lead  . ECHOCARDIOGRAM COMPLETE     Disposition:   FU with as needed    Signed, Minus Breeding, MD  11/09/2015 12:15 PM    [facility]

## 2015-11-09 NOTE — Patient Instructions (Signed)
Your physician recommends that you schedule a follow-up appointment in: As Needed  Your physician has requested that you have an echocardiogram. Echocardiography is a painless test that uses sound waves to create images of your heart. It provides your doctor with information about the size and shape of your heart and how well your heart's chambers and valves are working. This procedure takes approximately one hour. There are no restrictions for this procedure.  Your physician has requested that you have a lexiscan myoview. For further information please visit [email or web address]. Please follow instruction sheet, as given.  Your physician recommends that you return for lab work in: BNP

## 2015-11-10 ENCOUNTER — Telehealth (HOSPITAL_COMMUNITY): Payer: Self-pay

## 2015-11-10 LAB — BRAIN NATRIURETIC PEPTIDE

## 2015-11-10 NOTE — Telephone Encounter (Signed)
Encounter complete. 

## 2015-11-11 ENCOUNTER — Other Ambulatory Visit: Payer: Self-pay | Admitting: Cardiology

## 2015-11-11 LAB — BRAIN NATRIURETIC PEPTIDE: Brain Natriuretic Peptide: 13.1 pg/mL (ref 0.0–100.0)

## 2015-11-14 ENCOUNTER — Ambulatory Visit (HOSPITAL_COMMUNITY)
Admission: RE | Admit: 2015-11-14 | Discharge: 2015-11-14 | Disposition: A | Discharge status: Home / Self Care | Payer: Medicare Other | Source: Ambulatory Visit | Attending: Cardiovascular Disease | Admitting: Cardiovascular Disease

## 2015-11-14 DIAGNOSIS — R0609 Other forms of dyspnea: Secondary | ICD-10-CM | POA: Diagnosis not present

## 2015-11-14 DIAGNOSIS — Z87891 Personal history of nicotine dependence: Secondary | ICD-10-CM | POA: Diagnosis not present

## 2015-11-14 DIAGNOSIS — I10 Essential (primary) hypertension: Secondary | ICD-10-CM | POA: Insufficient documentation

## 2015-11-14 DIAGNOSIS — R0789 Other chest pain: Secondary | ICD-10-CM

## 2015-11-14 DIAGNOSIS — R5383 Other fatigue: Secondary | ICD-10-CM | POA: Insufficient documentation

## 2015-11-14 DIAGNOSIS — Z6836 Body mass index (BMI) 36.0-36.9, adult: Secondary | ICD-10-CM | POA: Insufficient documentation

## 2015-11-14 DIAGNOSIS — R0602 Shortness of breath: Secondary | ICD-10-CM | POA: Insufficient documentation

## 2015-11-14 DIAGNOSIS — E669 Obesity, unspecified: Secondary | ICD-10-CM | POA: Insufficient documentation

## 2015-11-14 LAB — MYOCARDIAL PERFUSION IMAGING
CHL CUP STRESS STAGE 1 SBP: 123 mmHg
CHL CUP STRESS STAGE 1 SPEED: 0 mph
CHL CUP STRESS STAGE 2 GRADE: 0 %
CHL CUP STRESS STAGE 2 SPEED: 0 mph
CHL CUP STRESS STAGE 3 GRADE: 0 %
CHL CUP STRESS STAGE 3 SPEED: 0 mph
CHL CUP STRESS STAGE 4 DBP: 91 mmHg
CSEPPHR: 88 {beats}/min
CSEPPMHR: 55 %
Estimated workload: 1 METS
LV dias vol: 71 mL
LVSYSVOL: 19 mL
Rest HR: 63 {beats}/min
SDS: 5
SRS: 2
SSS: 7
Stage 1 DBP: 81 mmHg
Stage 1 Grade: 0 %
Stage 1 HR: 63 {beats}/min
Stage 2 HR: 63 {beats}/min
Stage 3 HR: 88 {beats}/min
Stage 4 Grade: 0 %
Stage 4 HR: 87 {beats}/min
Stage 4 SBP: 141 mmHg
Stage 4 Speed: 0 mph
TID: 1.07

## 2015-11-14 MED ORDER — TECHNETIUM TC 99M SESTAMIBI GENERIC - CARDIOLITE
10.9000 | Freq: Once | INTRAVENOUS | Status: AC | PRN
  Administered 2015-11-14: 10.9 via INTRAVENOUS

## 2015-11-14 MED ORDER — REGADENOSON 0.4 MG/5ML IV SOLN
0.4000 mg | Freq: Once | INTRAVENOUS | Status: AC
  Administered 2015-11-14: 0.4 mg via INTRAVENOUS

## 2015-11-14 MED ORDER — TECHNETIUM TC 99M SESTAMIBI GENERIC - CARDIOLITE
30.9000 | Freq: Once | INTRAVENOUS | Status: AC | PRN
  Administered 2015-11-14: 30.9 via INTRAVENOUS

## 2015-11-20 ENCOUNTER — Telehealth: Payer: Self-pay | Admitting: Cardiology

## 2015-11-20 NOTE — Telephone Encounter (Signed)
Pt says she is returning a call she received in regards to her to her stress test . Please f/u with her  Thanks

## 2015-11-21 NOTE — Telephone Encounter (Signed)
Spoke with pt, pt aware of test result

## 2015-11-23 ENCOUNTER — Ambulatory Visit (HOSPITAL_COMMUNITY): Payer: Medicare Other | Attending: Cardiovascular Disease

## 2015-11-23 ENCOUNTER — Other Ambulatory Visit: Payer: Self-pay

## 2015-11-23 DIAGNOSIS — I7789 Other specified disorders of arteries and arterioles: Secondary | ICD-10-CM | POA: Diagnosis not present

## 2016-01-15 ENCOUNTER — Institutional Professional Consult (permissible substitution): Payer: Medicare Other | Admitting: Pulmonary Disease

## 2016-01-25 DIAGNOSIS — N3281 Overactive bladder: Secondary | ICD-10-CM | POA: Diagnosis not present

## 2016-01-25 DIAGNOSIS — E063 Autoimmune thyroiditis: Secondary | ICD-10-CM | POA: Diagnosis not present

## 2016-01-25 DIAGNOSIS — I10 Essential (primary) hypertension: Secondary | ICD-10-CM | POA: Diagnosis not present

## 2016-01-25 DIAGNOSIS — G894 Chronic pain syndrome: Secondary | ICD-10-CM | POA: Diagnosis not present

## 2016-01-25 DIAGNOSIS — Z6836 Body mass index (BMI) 36.0-36.9, adult: Secondary | ICD-10-CM | POA: Diagnosis not present

## 2016-01-25 DIAGNOSIS — J449 Chronic obstructive pulmonary disease, unspecified: Secondary | ICD-10-CM | POA: Diagnosis not present

## 2016-01-25 DIAGNOSIS — J209 Acute bronchitis, unspecified: Secondary | ICD-10-CM | POA: Diagnosis not present

## 2016-01-25 DIAGNOSIS — Z1389 Encounter for screening for other disorder: Secondary | ICD-10-CM | POA: Diagnosis not present

## 2016-01-25 DIAGNOSIS — J019 Acute sinusitis, unspecified: Secondary | ICD-10-CM | POA: Diagnosis not present

## 2016-01-25 DIAGNOSIS — E669 Obesity, unspecified: Secondary | ICD-10-CM | POA: Diagnosis not present

## 2016-01-30 DIAGNOSIS — J449 Chronic obstructive pulmonary disease, unspecified: Secondary | ICD-10-CM | POA: Diagnosis not present

## 2016-02-06 DIAGNOSIS — R102 Pelvic and perineal pain: Secondary | ICD-10-CM | POA: Diagnosis not present

## 2016-02-06 DIAGNOSIS — Z90722 Acquired absence of ovaries, bilateral: Secondary | ICD-10-CM | POA: Diagnosis not present

## 2016-02-06 DIAGNOSIS — Z90710 Acquired absence of both cervix and uterus: Secondary | ICD-10-CM | POA: Diagnosis not present

## 2016-03-01 DIAGNOSIS — J449 Chronic obstructive pulmonary disease, unspecified: Secondary | ICD-10-CM | POA: Diagnosis not present

## 2016-03-22 ENCOUNTER — Institutional Professional Consult (permissible substitution): Payer: Medicare Other | Admitting: Pulmonary Disease

## 2016-03-28 DIAGNOSIS — J441 Chronic obstructive pulmonary disease with (acute) exacerbation: Secondary | ICD-10-CM | POA: Diagnosis not present

## 2016-03-28 DIAGNOSIS — Z6835 Body mass index (BMI) 35.0-35.9, adult: Secondary | ICD-10-CM | POA: Diagnosis not present

## 2016-03-28 DIAGNOSIS — Z1389 Encounter for screening for other disorder: Secondary | ICD-10-CM | POA: Diagnosis not present

## 2016-03-28 DIAGNOSIS — J069 Acute upper respiratory infection, unspecified: Secondary | ICD-10-CM | POA: Diagnosis not present

## 2016-03-28 DIAGNOSIS — E6609 Other obesity due to excess calories: Secondary | ICD-10-CM | POA: Diagnosis not present

## 2016-03-31 DIAGNOSIS — J449 Chronic obstructive pulmonary disease, unspecified: Secondary | ICD-10-CM | POA: Diagnosis not present

## 2016-04-30 ENCOUNTER — Institutional Professional Consult (permissible substitution): Payer: Medicare Other | Admitting: Pulmonary Disease

## 2016-05-01 DIAGNOSIS — J449 Chronic obstructive pulmonary disease, unspecified: Secondary | ICD-10-CM | POA: Diagnosis not present

## 2016-05-13 DIAGNOSIS — R34 Anuria and oliguria: Secondary | ICD-10-CM | POA: Diagnosis not present

## 2016-05-13 DIAGNOSIS — G894 Chronic pain syndrome: Secondary | ICD-10-CM | POA: Diagnosis not present

## 2016-05-13 DIAGNOSIS — N760 Acute vaginitis: Secondary | ICD-10-CM | POA: Diagnosis not present

## 2016-05-13 DIAGNOSIS — E6609 Other obesity due to excess calories: Secondary | ICD-10-CM | POA: Diagnosis not present

## 2016-05-13 DIAGNOSIS — E669 Obesity, unspecified: Secondary | ICD-10-CM | POA: Diagnosis not present

## 2016-05-13 DIAGNOSIS — I10 Essential (primary) hypertension: Secondary | ICD-10-CM | POA: Diagnosis not present

## 2016-05-13 DIAGNOSIS — Z6835 Body mass index (BMI) 35.0-35.9, adult: Secondary | ICD-10-CM | POA: Diagnosis not present

## 2016-05-13 DIAGNOSIS — J449 Chronic obstructive pulmonary disease, unspecified: Secondary | ICD-10-CM | POA: Diagnosis not present

## 2016-05-13 DIAGNOSIS — Z1389 Encounter for screening for other disorder: Secondary | ICD-10-CM | POA: Diagnosis not present

## 2016-05-31 DIAGNOSIS — J449 Chronic obstructive pulmonary disease, unspecified: Secondary | ICD-10-CM | POA: Diagnosis not present

## 2016-06-25 DIAGNOSIS — L570 Actinic keratosis: Secondary | ICD-10-CM | POA: Diagnosis not present

## 2016-06-25 DIAGNOSIS — M79671 Pain in right foot: Secondary | ICD-10-CM | POA: Diagnosis not present

## 2016-06-25 DIAGNOSIS — D4989 Neoplasm of unspecified behavior of other specified sites: Secondary | ICD-10-CM | POA: Diagnosis not present

## 2016-07-01 DIAGNOSIS — J449 Chronic obstructive pulmonary disease, unspecified: Secondary | ICD-10-CM | POA: Diagnosis not present

## 2016-08-01 DIAGNOSIS — J449 Chronic obstructive pulmonary disease, unspecified: Secondary | ICD-10-CM | POA: Diagnosis not present

## 2016-08-27 ENCOUNTER — Other Ambulatory Visit (HOSPITAL_COMMUNITY): Payer: Self-pay | Admitting: Internal Medicine

## 2016-08-27 ENCOUNTER — Ambulatory Visit (HOSPITAL_COMMUNITY)
Admission: RE | Admit: 2016-08-27 | Discharge: 2016-08-27 | Disposition: A | Discharge status: Home / Self Care | Payer: Medicare Other | Source: Ambulatory Visit | Attending: Internal Medicine | Admitting: Internal Medicine

## 2016-08-27 DIAGNOSIS — Z981 Arthrodesis status: Secondary | ICD-10-CM | POA: Diagnosis not present

## 2016-08-27 DIAGNOSIS — R0600 Dyspnea, unspecified: Secondary | ICD-10-CM

## 2016-08-27 DIAGNOSIS — R05 Cough: Secondary | ICD-10-CM | POA: Diagnosis not present

## 2016-08-27 DIAGNOSIS — M549 Dorsalgia, unspecified: Secondary | ICD-10-CM | POA: Diagnosis not present

## 2016-08-27 DIAGNOSIS — Z1389 Encounter for screening for other disorder: Secondary | ICD-10-CM | POA: Diagnosis not present

## 2016-08-27 DIAGNOSIS — R0602 Shortness of breath: Secondary | ICD-10-CM | POA: Diagnosis not present

## 2016-08-27 DIAGNOSIS — E063 Autoimmune thyroiditis: Secondary | ICD-10-CM | POA: Diagnosis not present

## 2016-08-27 DIAGNOSIS — R079 Chest pain, unspecified: Secondary | ICD-10-CM | POA: Diagnosis not present

## 2016-08-27 DIAGNOSIS — R918 Other nonspecific abnormal finding of lung field: Secondary | ICD-10-CM | POA: Insufficient documentation

## 2016-08-27 DIAGNOSIS — Z6835 Body mass index (BMI) 35.0-35.9, adult: Secondary | ICD-10-CM | POA: Diagnosis not present

## 2016-08-27 DIAGNOSIS — K219 Gastro-esophageal reflux disease without esophagitis: Secondary | ICD-10-CM | POA: Diagnosis not present

## 2016-08-30 DIAGNOSIS — L570 Actinic keratosis: Secondary | ICD-10-CM | POA: Diagnosis not present

## 2016-08-30 DIAGNOSIS — M79671 Pain in right foot: Secondary | ICD-10-CM | POA: Diagnosis not present

## 2016-08-31 DIAGNOSIS — J449 Chronic obstructive pulmonary disease, unspecified: Secondary | ICD-10-CM | POA: Diagnosis not present

## 2016-09-02 DIAGNOSIS — R918 Other nonspecific abnormal finding of lung field: Secondary | ICD-10-CM | POA: Diagnosis not present

## 2016-09-10 ENCOUNTER — Ambulatory Visit (INDEPENDENT_AMBULATORY_CARE_PROVIDER_SITE_OTHER): Payer: Medicare Other | Admitting: Pulmonary Disease

## 2016-09-10 ENCOUNTER — Other Ambulatory Visit (INDEPENDENT_AMBULATORY_CARE_PROVIDER_SITE_OTHER): Payer: Medicare Other

## 2016-09-10 ENCOUNTER — Encounter: Payer: Self-pay | Admitting: Pulmonary Disease

## 2016-09-10 VITALS — BP 142/76 | HR 62 | Ht 64.0 in | Wt 195.0 lb

## 2016-09-10 DIAGNOSIS — R918 Other nonspecific abnormal finding of lung field: Secondary | ICD-10-CM

## 2016-09-10 DIAGNOSIS — Z01812 Encounter for preprocedural laboratory examination: Secondary | ICD-10-CM

## 2016-09-10 DIAGNOSIS — J9859 Other diseases of mediastinum, not elsewhere classified: Secondary | ICD-10-CM

## 2016-09-10 LAB — CBC WITH DIFFERENTIAL/PLATELET
BASOS PCT: 0.6 % (ref 0.0–3.0)
Basophils Absolute: 0.1 10*3/uL (ref 0.0–0.1)
EOS ABS: 0.2 10*3/uL (ref 0.0–0.7)
EOS PCT: 2 % (ref 0.0–5.0)
HEMATOCRIT: 42.5 % (ref 36.0–46.0)
HEMOGLOBIN: 14.4 g/dL (ref 12.0–15.0)
LYMPHS PCT: 26.9 % (ref 12.0–46.0)
Lymphs Abs: 2.6 10*3/uL (ref 0.7–4.0)
MCHC: 34 g/dL (ref 30.0–36.0)
MCV: 85.1 fl (ref 78.0–100.0)
MONOS PCT: 5.6 % (ref 3.0–12.0)
Monocytes Absolute: 0.5 10*3/uL (ref 0.1–1.0)
NEUTROS PCT: 64.9 % (ref 43.0–77.0)
Neutro Abs: 6.3 10*3/uL (ref 1.4–7.7)
Platelets: 344 10*3/uL (ref 150.0–400.0)
RBC: 4.99 Mil/uL (ref 3.87–5.11)
RDW: 14.1 % (ref 11.5–15.5)
WBC: 9.8 10*3/uL (ref 4.0–10.5)

## 2016-09-10 NOTE — Patient Instructions (Signed)
We will arrange for a bronchoscopy with biopsy in the next several days we will let you know what the timing of this is today. We will check your blood work today to make sure you're not at excessive risk of bleeding during the procedure You will need to have a PET scan at some point, we will let you know when that will be ordered after we've done the biopsy We'll plan on seeing you back in one to 2 weeks after the biopsy

## 2016-09-10 NOTE — Assessment & Plan Note (Signed)
I have personally reviewed the images from her CT chest from last week which shows a large mediastinal mass which is either contiguous with or adjacent to adenopathy in the left hilum as well. Given her smoking history this is highly concerning for lung cancer, specifically small cell carcinoma. As it's adjacent to the esophagus I suppose esophageal malignancy is also possible. There is no palpable adenopathy on physical exam today.  The best approach at this point is to pursue an endobronchial ultrasound-guided needle aspiration of the mass. We will arrange for this as soon as possible.  Today we discussed the risks and benefits of general anesthesia and the procedure itself. She understands and is willing to proceed.  Plan: Check CBC Plan for endobronchial ultrasound-guided needle aspiration of the mediastinal mass She will need a head CT at some point, we will plan for this after the ultrasound-guided needle aspiration. F/u 1-2 weeks

## 2016-09-10 NOTE — Progress Notes (Signed)
Subjective:    Patient ID: Rebekah Henderson, female    DOB: [birth year], 60 y.o.   MRN: [id number]  HPI Chief Complaint  Patient presents with  . Advice Only    Referred by Dr. Gerarda Fraction for hypoxia, abn ct chest.     Mr. Kasparian was sent to me for evaluation of an abnormal CT scan of her chest.  She had "pneumonia" for over a month which was associated with wheezing, hoarseness, cough.  She was treated with prednisone and had a CXR by her PCP.  The CXR was worrisome for a mass in her chest so she was referred for a CT scan which she had last week.   She has a surgical history of breast implants and cervical spine correction.  She quit smoking 10-12 years ago, smoked 2 packs for 20 years.   She says her father died of lung cancer, mother died of breast cancer.  Sister had CHF.  She has had some pain in her chest.  She has had some trouble swallowing.  She says that food will get "hung in her chest" and will throw up.  She has had more trouble swallowing in the last few months.  She has been hoarse for the last month to 6 weeks.  She was started on nighttime oxygen a year ago when she was having trouble breathing, apparently this helped.  She tells me she was diagnosed with COPD by her PCP several years ago. She takes Advair and albuterol tablets and nebulized.   She has lost 10 pounds in the last 2 months.   Past Medical History:  Diagnosis Date  . Anxiety   . Arthritis   . Arthritis   . Cancer ([place])    skin   . Colon polyps   . COPD (chronic obstructive pulmonary disease) ([place])   . Diverticulitis   . Fibromyalgia   . GERD (gastroesophageal reflux disease)   . Hypertension   . Hypothyroidism   . IBS (irritable bowel syndrome)   . PONV (postoperative nausea and vomiting)    pt also states that she had some difficulty breathing after cervical fusion     Family History  Problem Relation Age of Onset  . Breast cancer Mother   . Diabetes Maternal Grandfather   . Lung cancer  Father   . Heart failure Sister 10    Died. Morbidly obese  . Colon cancer Neg Hx   . Colon polyps Neg Hx      Social History   Social History  . Marital status: Divorced    Spouse name: N/A  . Number of children: 1  . Years of education: College    Occupational History  . Retired     Social History Main Topics  . Smoking status: Former Smoker    Packs/day: 2.00    Years: 20.00    Quit date: 05/20/2004  . Smokeless tobacco: Never Used  . Alcohol use No  . Drug use: No  . Sexual activity: Not on file   Other Topics Concern  . Not on file   Social History Narrative   Lives alone.  Retired from [employer] Reactions  . Penicillins Hives and Swelling  . Codeine Nausea Only  . Ranitidine Hcl Other (See Comments)    dizzy  . Keflex [Cephalexin]   . Lyrica [Pregabalin]      Outpatient Medications Prior to Visit  Medication Sig Dispense Refill  . albuterol (  PROVENTIL) 2 MG tablet Take 2 mg by mouth daily as needed for wheezing or shortness of breath.     Marland Kitchen amLODipine (NORVASC) 5 MG tablet Take 5 mg by mouth daily.      Marland Kitchen aspirin EC 81 MG tablet Take 81 mg by mouth daily.    Marland Kitchen estazolam (PROSOM) 2 MG tablet Take 2 mg by mouth at bedtime.    Marland Kitchen estradiol (ESTRACE) 2 MG tablet Take 2 mg by mouth daily.      Marland Kitchen FLUoxetine (PROZAC) 40 MG capsule Take 40 mg by mouth daily.   2  . HYDROcodone-acetaminophen (NORCO/VICODIN) 5-325 MG per tablet Take 1 tablet by mouth every 6 (six) hours as needed for pain.     Marland Kitchen levothyroxine (SYNTHROID, LEVOTHROID) 25 MCG tablet Take 25 mcg by mouth daily.      . metoprolol succinate (TOPROL-XL) 25 MG 24 hr tablet Take 25 mg by mouth daily.    . ondansetron (ZOFRAN) 4 MG tablet Take 1 tablet (4 mg total) by mouth every 6 (six) hours. 12 tablet 0  . pantoprazole (PROTONIX) 40 MG tablet Take 40 mg by mouth 2 (two) times daily.     Marland Kitchen PROAIR HFA 108 (90 BASE) MCG/ACT inhaler Inhale 1-2 puffs into the lungs every 4 (four)  hours as needed.   0  . triamterene-hydrochlorothiazide (DYAZIDE) 37.5-25 MG per capsule Take 1 capsule by mouth every morning.      . ciprofloxacin (CIPRO) 500 MG tablet Take 1 tablet by mouth 2 (two) times daily.  0   No facility-administered medications prior to visit.       Review of Systems  Constitutional: Positive for fatigue. Negative for fever and unexpected weight change.  HENT: Positive for congestion and sore throat. Negative for dental problem, ear pain, nosebleeds, postnasal drip, rhinorrhea, sinus pressure, sneezing and trouble swallowing.   Eyes: Negative for redness and itching.  Respiratory: Positive for cough and shortness of breath. Negative for chest tightness and wheezing.   Cardiovascular: Negative for palpitations and leg swelling.  Gastrointestinal: Negative for nausea and vomiting.  Genitourinary: Negative for dysuria.  Musculoskeletal: Positive for joint swelling.  Skin: Negative for rash.  Neurological: Positive for headaches.  Hematological: Does not bruise/bleed easily.  Psychiatric/Behavioral: Negative for dysphoric mood. The patient is nervous/anxious.        Objective:   Physical Exam  Vitals:   09/10/16 1147  BP: (!) 142/76  Pulse: 62  SpO2: 91%  Weight: 195 lb (88.5 kg)  Height: '5\' 4"'$  (1.626 m)   RA  Gen: well appearing, no acute distress HENT: NCAT, OP clear, neck supple without masses Eyes: PERRL, EOMi Lymph: no cervical lymphadenopathy PULM: CTA B CV: RRR, no mgr, no JVD GI: BS+, soft, nontender, no hsm Derm: no rash or skin breakdown MSK: normal bulk and tone Neuro: A&Ox4, CN II-XII intact, strength 5/5 in all 4 extremities Psyche: normal mood and affect   December 2016 records from cardiology reviewed where she was noted to have chest pain and was referred for a nuclear stress test.  December 2016 echocardiogram LV EF 55-60%, consistent with grade 1 diastolic dysfunction.6 RV size and function normal. December 2016 nuclear  stress test showed low risk scan  CT October 2017 CT chest images personally reviewed showing a large mediastinal mass arising in the light aortopulmonary window extending to the hilum  09/02/2016 CT scan chest report: FINDINGS: There is a 5.8 x 4.6 cm soft tissue mass in the aorticopulmonary window. This is  associated with mass effect upon the left main pulmonary artery and left mainstem bronchus but does not appear to be arising from the bronchus. It is contiguous  with the esophagus with no fat plane between the esophagus and the mass. There is a mass in the central aspect of the left lower lobe contiguous with the left hilum could represent a mass or adenopathy measuring 2.3 x 2.0 cm on series 2 image 34. There  is some atelectasis in the left lower lobe beyond this mass.  Mild atelectasis in the lingula. No effusions.  Postsurgical changes in the cervical spine. No focal bone lesions. Normal-appearing adrenal glands. Small hiatal hernia with a paraesophageal component. There are some mildly prominent left axillary lymph nodes of unclear significance.     Assessment & Plan:  Mediastinal mass I have personally reviewed the images from her CT chest from last week which shows a large mediastinal mass which is either contiguous with or adjacent to adenopathy in the left hilum as well. Given her smoking history this is highly concerning for lung cancer, specifically small cell carcinoma. As it's adjacent to the esophagus I suppose esophageal malignancy is also possible. There is no palpable adenopathy on physical exam today.  The best approach at this point is to pursue an endobronchial ultrasound-guided needle aspiration of the mass. We will arrange for this as soon as possible.  Today we discussed the risks and benefits of general anesthesia and the procedure itself. She understands and is willing to proceed.  Plan: Check CBC Plan for endobronchial ultrasound-guided needle aspiration of the  mediastinal mass She will need a head CT at some point, we will plan for this after the ultrasound-guided needle aspiration. F/u 1-2 weeks    Current Outpatient Prescriptions:  .  albuterol (PROVENTIL) 2 MG tablet, Take 2 mg by mouth daily as needed for wheezing or shortness of breath. , Disp: , Rfl:  .  amLODipine (NORVASC) 5 MG tablet, Take 5 mg by mouth daily.  , Disp: , Rfl:  .  aspirin EC 81 MG tablet, Take 81 mg by mouth daily., Disp: , Rfl:  .  estazolam (PROSOM) 2 MG tablet, Take 2 mg by mouth at bedtime., Disp: , Rfl:  .  estradiol (ESTRACE) 2 MG tablet, Take 2 mg by mouth daily.  , Disp: , Rfl:  .  FLUoxetine (PROZAC) 40 MG capsule, Take 40 mg by mouth daily. , Disp: , Rfl: 2 .  HYDROcodone-acetaminophen (NORCO/VICODIN) 5-325 MG per tablet, Take 1 tablet by mouth every 6 (six) hours as needed for pain. , Disp: , Rfl:  .  levothyroxine (SYNTHROID, LEVOTHROID) 25 MCG tablet, Take 25 mcg by mouth daily.  , Disp: , Rfl:  .  metoprolol succinate (TOPROL-XL) 25 MG 24 hr tablet, Take 25 mg by mouth daily., Disp: , Rfl:  .  ondansetron (ZOFRAN) 4 MG tablet, Take 1 tablet (4 mg total) by mouth every 6 (six) hours., Disp: 12 tablet, Rfl: 0 .  pantoprazole (PROTONIX) 40 MG tablet, Take 40 mg by mouth 2 (two) times daily. , Disp: , Rfl:  .  PROAIR HFA 108 (90 BASE) MCG/ACT inhaler, Inhale 1-2 puffs into the lungs every 4 (four) hours as needed. , Disp: , Rfl: 0 .  triamterene-hydrochlorothiazide (DYAZIDE) 37.5-25 MG per capsule, Take 1 capsule by mouth every morning.  , Disp: , Rfl:

## 2016-09-11 ENCOUNTER — Encounter (HOSPITAL_COMMUNITY): Payer: Self-pay | Admitting: *Deleted

## 2016-09-11 ENCOUNTER — Telehealth: Payer: Self-pay | Admitting: Pulmonary Disease

## 2016-09-11 NOTE — Progress Notes (Signed)
Anesthesia Chart Review: SAME DAY WORK-UP.   Patient is a 60 year old female scheduled for endobronchial ultrasound on 09/12/16 by Dr. Lake Bells. She recently was being treated for "pneumonia". Her CXR was abnormal prompting chest CT showing a large mediastinal mass with hilar adenopathy. She had associated  hoarseness, dysphagia, weight loss (10 lb in 2 months) and chest pains (which prompted cardiology evaluation 11/2015 with negative work-up). She was referred to Dr. Lake Bells and saw him yesterday. CT findings are felt concerning for lung cancer or even possibly esophageal cancer based on close proximity of the esophagus. The above procedure was recommended in hopes to get a definitive diagnosis.  Other history includes former smoker, COPD with nocturnal O2, post-operative N/V, fibromyalgia, IBS, arthritis, anxiety, GERD, hypothyroidism, HTN, exertional dyspnea, IBS, skin cancer, breast augmentation s/p implant removal, cervical fusion '13, cholecystectomy, tonsillectomy. PCP is Dr. Gerarda Fraction.  Meds include albuterol, amlodipine, ASA 81 mg, Bentyl, estazolam, Estrace, Prozac, Norco, levothyroxine, Toprol XL, Protonix, Dyazide.   11/09/15 EKG: NSR, low voltage QRS.  11/14/15 Nuclear stress test:  The left ventricular ejection fraction is hyperdynamic (>65%).  Nuclear stress EF: 73%.  The study is normal.  This is a low risk study. 1. Low risk study 2. Nl perfusion and EF No further cardiac work-up recommended by Dr. Percival Spanish.  11/23/15 Echo: Study Conclusions - Left ventricle: The cavity size was normal. Wall thickness was normal. Systolic function was normal. The estimated ejection fraction was in the range of 55% to 60%. Wall motion was normal; there were no regional wall motion abnormalities. Doppler parameters are consistent with abnormal left ventricular relaxation (grade 1 diastolic dysfunction). Aortic root normal in size. Ascending aorta was not visualized.   08/12/02 Cardiac cath:  CONCLUSION: 1. No significant coronary artery disease (40% mid LAD). 2. Normal left ventricular systolic function. 3. Moderately dilated aortic root with questionable linear density noted     just adjacent to the ascending arch which may be consistent with aortic     dissection. 4. No evidence of significant distal aortic disease.  09/02/16 Chest CT [facility]; Care Everywhere): IMPRESSION: 2.3 cm central left lower lobe mass versus hilar adenopathy .Large soft tissue mass in the aorticopulmonary window. The appearance suggests possible involvement of the esophageal wall but with still favor primary bronchogenic malignancy with invasion  over esophageal malignancy or lymphoma.  08/27/16 CXR: IMPRESSION: Abnormal soft tissue masslike density at the right lung base anteriorly. This is worrisome for malignancy though atypical pneumonia could produce a similar finding. Abnormal soft tissue density in the AP window region is worrisome for lymphadenopathy. Chest CT scanning now is recommended.  CBC normal yesterday. Additional labs to be done on arrival. If labs acceptable and otherwise no acute changes then I would anticipate that she could proceed as planned.  George Hugh [facility] Short Stay Center/Anesthesiology Phone [phone] 09/11/2016 3:24 PM

## 2016-09-11 NOTE — Telephone Encounter (Signed)
LVM for pt to return call

## 2016-09-12 ENCOUNTER — Ambulatory Visit (HOSPITAL_COMMUNITY)
Admission: RE | Admit: 2016-09-12 | Discharge: 2016-09-12 | Disposition: A | Discharge status: Home / Self Care | Payer: Medicare Other | Source: Ambulatory Visit | Attending: Pulmonary Disease | Admitting: Pulmonary Disease

## 2016-09-12 ENCOUNTER — Other Ambulatory Visit: Payer: Self-pay | Admitting: Pulmonary Disease

## 2016-09-12 ENCOUNTER — Encounter (HOSPITAL_COMMUNITY)
Admission: RE | Disposition: A | Discharge status: Home / Self Care | Payer: Self-pay | Source: Ambulatory Visit | Attending: Pulmonary Disease

## 2016-09-12 ENCOUNTER — Ambulatory Visit (HOSPITAL_COMMUNITY): Payer: Medicare Other | Admitting: Vascular Surgery

## 2016-09-12 ENCOUNTER — Encounter (HOSPITAL_COMMUNITY): Payer: Self-pay | Admitting: Urology

## 2016-09-12 ENCOUNTER — Other Ambulatory Visit (HOSPITAL_COMMUNITY): Payer: Self-pay | Admitting: *Deleted

## 2016-09-12 DIAGNOSIS — I10 Essential (primary) hypertension: Secondary | ICD-10-CM | POA: Insufficient documentation

## 2016-09-12 DIAGNOSIS — Z803 Family history of malignant neoplasm of breast: Secondary | ICD-10-CM | POA: Diagnosis not present

## 2016-09-12 DIAGNOSIS — F17210 Nicotine dependence, cigarettes, uncomplicated: Secondary | ICD-10-CM | POA: Diagnosis not present

## 2016-09-12 DIAGNOSIS — Z801 Family history of malignant neoplasm of trachea, bronchus and lung: Secondary | ICD-10-CM | POA: Insufficient documentation

## 2016-09-12 DIAGNOSIS — M1990 Unspecified osteoarthritis, unspecified site: Secondary | ICD-10-CM | POA: Diagnosis not present

## 2016-09-12 DIAGNOSIS — C771 Secondary and unspecified malignant neoplasm of intrathoracic lymph nodes: Secondary | ICD-10-CM | POA: Diagnosis not present

## 2016-09-12 DIAGNOSIS — R590 Localized enlarged lymph nodes: Secondary | ICD-10-CM | POA: Diagnosis present

## 2016-09-12 DIAGNOSIS — C3402 Malignant neoplasm of left main bronchus: Secondary | ICD-10-CM | POA: Insufficient documentation

## 2016-09-12 DIAGNOSIS — Z888 Allergy status to other drugs, medicaments and biological substances status: Secondary | ICD-10-CM | POA: Insufficient documentation

## 2016-09-12 DIAGNOSIS — Z880 Allergy status to penicillin: Secondary | ICD-10-CM | POA: Diagnosis not present

## 2016-09-12 DIAGNOSIS — Z881 Allergy status to other antibiotic agents status: Secondary | ICD-10-CM | POA: Insufficient documentation

## 2016-09-12 DIAGNOSIS — Z833 Family history of diabetes mellitus: Secondary | ICD-10-CM | POA: Insufficient documentation

## 2016-09-12 DIAGNOSIS — M797 Fibromyalgia: Secondary | ICD-10-CM | POA: Diagnosis not present

## 2016-09-12 DIAGNOSIS — Z885 Allergy status to narcotic agent status: Secondary | ICD-10-CM | POA: Insufficient documentation

## 2016-09-12 DIAGNOSIS — E669 Obesity, unspecified: Secondary | ICD-10-CM | POA: Diagnosis not present

## 2016-09-12 DIAGNOSIS — Z8249 Family history of ischemic heart disease and other diseases of the circulatory system: Secondary | ICD-10-CM | POA: Diagnosis not present

## 2016-09-12 DIAGNOSIS — F419 Anxiety disorder, unspecified: Secondary | ICD-10-CM | POA: Insufficient documentation

## 2016-09-12 DIAGNOSIS — J9859 Other diseases of mediastinum, not elsewhere classified: Secondary | ICD-10-CM

## 2016-09-12 DIAGNOSIS — J449 Chronic obstructive pulmonary disease, unspecified: Secondary | ICD-10-CM | POA: Diagnosis not present

## 2016-09-12 DIAGNOSIS — C801 Malignant (primary) neoplasm, unspecified: Secondary | ICD-10-CM

## 2016-09-12 DIAGNOSIS — Z6833 Body mass index (BMI) 33.0-33.9, adult: Secondary | ICD-10-CM | POA: Diagnosis not present

## 2016-09-12 DIAGNOSIS — K219 Gastro-esophageal reflux disease without esophagitis: Secondary | ICD-10-CM | POA: Diagnosis not present

## 2016-09-12 DIAGNOSIS — E039 Hypothyroidism, unspecified: Secondary | ICD-10-CM | POA: Insufficient documentation

## 2016-09-12 DIAGNOSIS — C383 Malignant neoplasm of mediastinum, part unspecified: Secondary | ICD-10-CM | POA: Diagnosis not present

## 2016-09-12 DIAGNOSIS — R918 Other nonspecific abnormal finding of lung field: Secondary | ICD-10-CM | POA: Diagnosis not present

## 2016-09-12 HISTORY — DX: Dyspnea, unspecified: R06.00

## 2016-09-12 HISTORY — PX: VIDEO BRONCHOSCOPY WITH ENDOBRONCHIAL ULTRASOUND: SHX6177

## 2016-09-12 LAB — BASIC METABOLIC PANEL
Anion gap: 10 (ref 5–15)
BUN: 7 mg/dL (ref 6–20)
CALCIUM: 9.1 mg/dL (ref 8.9–10.3)
CHLORIDE: 103 mmol/L (ref 101–111)
CO2: 26 mmol/L (ref 22–32)
CREATININE: 0.58 mg/dL (ref 0.44–1.00)
GFR calc non Af Amer: >60 mL/min (ref >60)
Glucose, Bld: 88 mg/dL (ref 65–99)
Potassium: 3.4 mmol/L — ABNORMAL LOW (ref 3.5–5.1)
Sodium: 139 mmol/L (ref 135–145)

## 2016-09-12 SURGERY — BRONCHOSCOPY, WITH EBUS
Anesthesia: General

## 2016-09-12 MED ORDER — FENTANYL CITRATE (PF) 100 MCG/2ML IJ SOLN: 25.0000 ug | INTRAMUSCULAR | Status: DC | PRN

## 2016-09-12 MED ORDER — LACTATED RINGERS IV SOLN
INTRAVENOUS | Status: DC
Start: 2016-09-12 — End: 2016-09-12
  Administered 2016-09-12 (×2): via INTRAVENOUS

## 2016-09-12 MED ORDER — PROPOFOL 10 MG/ML IV BOLUS
INTRAVENOUS | Status: DC | PRN
  Administered 2016-09-12: 100 mg via INTRAVENOUS

## 2016-09-12 MED ORDER — DEXAMETHASONE SODIUM PHOSPHATE 10 MG/ML IJ SOLN
INTRAMUSCULAR | Status: DC | PRN
  Administered 2016-09-12: 10 mg via INTRAVENOUS

## 2016-09-12 MED ORDER — ONDANSETRON HCL 4 MG/2ML IJ SOLN
INTRAMUSCULAR | Status: AC
  Filled 2016-09-12: qty 2

## 2016-09-12 MED ORDER — ONDANSETRON HCL 4 MG/2ML IJ SOLN
INTRAMUSCULAR | Status: DC | PRN
  Administered 2016-09-12: 4 mg via INTRAVENOUS

## 2016-09-12 MED ORDER — SUCCINYLCHOLINE CHLORIDE 20 MG/ML IJ SOLN
INTRAMUSCULAR | Status: DC | PRN
  Administered 2016-09-12: 100 mg via INTRAVENOUS

## 2016-09-12 MED ORDER — METOCLOPRAMIDE HCL 5 MG/ML IJ SOLN: 10.0000 mg | Freq: Once | INTRAMUSCULAR | Status: DC | PRN

## 2016-09-12 MED ORDER — EPHEDRINE SULFATE 50 MG/ML IJ SOLN
INTRAMUSCULAR | Status: DC | PRN
  Administered 2016-09-12 (×2): 10 mg via INTRAVENOUS

## 2016-09-12 MED ORDER — 0.9 % SODIUM CHLORIDE (POUR BTL) OPTIME
TOPICAL | Status: DC | PRN
  Administered 2016-09-12: 1000 mL

## 2016-09-12 MED ORDER — SCOPOLAMINE 1 MG/3DAYS TD PT72
MEDICATED_PATCH | TRANSDERMAL | Status: AC
  Filled 2016-09-12: qty 1

## 2016-09-12 MED ORDER — LIDOCAINE HCL (CARDIAC) 20 MG/ML IV SOLN
INTRAVENOUS | Status: DC | PRN
  Administered 2016-09-12: 60 mg via INTRAVENOUS

## 2016-09-12 MED ORDER — SODIUM CHLORIDE 0.9 % IV SOLN
0.0125 ug/kg/min | INTRAVENOUS | Status: AC
  Administered 2016-09-12: .25 ug/kg/min via INTRAVENOUS
  Filled 2016-09-12: qty 2000

## 2016-09-12 MED ORDER — MEPERIDINE HCL 25 MG/ML IJ SOLN
6.2500 mg | INTRAMUSCULAR | Status: DC | PRN
Start: 2016-09-12 — End: 2016-09-12

## 2016-09-12 MED ORDER — PROPOFOL 10 MG/ML IV BOLUS
INTRAVENOUS | Status: AC
  Filled 2016-09-12: qty 20

## 2016-09-12 MED ORDER — MIDAZOLAM HCL 5 MG/5ML IJ SOLN
INTRAMUSCULAR | Status: DC | PRN
  Administered 2016-09-12: 2 mg via INTRAVENOUS

## 2016-09-12 MED ORDER — LIDOCAINE 2% (20 MG/ML) 5 ML SYRINGE
INTRAMUSCULAR | Status: AC
  Filled 2016-09-12: qty 5

## 2016-09-12 MED ORDER — PHENYLEPHRINE HCL 10 MG/ML IJ SOLN
INTRAMUSCULAR | Status: DC | PRN
  Administered 2016-09-12 (×3): 80 ug via INTRAVENOUS

## 2016-09-12 MED ORDER — SCOPOLAMINE 1 MG/3DAYS TD PT72
1.0000 | MEDICATED_PATCH | TRANSDERMAL | Status: DC
  Administered 2016-09-12: 1.5 mg via TRANSDERMAL

## 2016-09-12 MED ORDER — MIDAZOLAM HCL 2 MG/2ML IJ SOLN
INTRAMUSCULAR | Status: AC
  Filled 2016-09-12: qty 2

## 2016-09-12 SURGICAL SUPPLY — 26 items
BRUSH CYTOL CELLEBRITY 1.5X140 (MISCELLANEOUS) ×3 IMPLANT
CANISTER SUCTION 2500CC (MISCELLANEOUS) ×3 IMPLANT
CONT SPEC 4OZ CLIKSEAL STRL BL (MISCELLANEOUS) ×3 IMPLANT
COVER DOME SNAP 22 D (MISCELLANEOUS) ×3 IMPLANT
COVER TABLE BACK 60X90 (DRAPES) ×3 IMPLANT
FORCEPS BIOP RJ4 1.8 (CUTTING FORCEPS) IMPLANT
FORCEPS RADIAL JAW LRG 4 PULM (INSTRUMENTS) ×2 IMPLANT
GAUZE SPONGE 4X4 12PLY STRL (GAUZE/BANDAGES/DRESSINGS) IMPLANT
GLOVE BIO SURGEON STRL SZ8 (GLOVE) ×3 IMPLANT
GOWN STRL REUS W/ TWL LRG LVL3 (GOWN DISPOSABLE) ×2 IMPLANT
GOWN STRL REUS W/TWL LRG LVL3 (GOWN DISPOSABLE) ×1
KIT CLEAN ENDO COMPLIANCE (KITS) ×6 IMPLANT
KIT ROOM TURNOVER OR (KITS) ×3 IMPLANT
MARKER SKIN DUAL TIP RULER LAB (MISCELLANEOUS) ×3 IMPLANT
NEEDLE BIOPSY TRANSBRONCH 21G (NEEDLE) IMPLANT
NEEDLE EBUS SONO TIP PENTAX (NEEDLE) ×3 IMPLANT
NS IRRIG 1000ML POUR BTL (IV SOLUTION) ×3 IMPLANT
OIL SILICONE PENTAX (PARTS (SERVICE/REPAIRS)) IMPLANT
PAD ARMBOARD 7.5X6 YLW CONV (MISCELLANEOUS) ×6 IMPLANT
RADIAL JAW LRG 4 PULMONARY (INSTRUMENTS) ×1
SYR 20CC LL (SYRINGE) ×3 IMPLANT
SYR 20ML ECCENTRIC (SYRINGE) ×6 IMPLANT
SYR 5ML LUER SLIP (SYRINGE) ×3 IMPLANT
TOWEL OR 17X24 6PK STRL BLUE (TOWEL DISPOSABLE) ×3 IMPLANT
TRAP SPECIMEN MUCOUS 40CC (MISCELLANEOUS) IMPLANT
TUBE CONNECTING 20X1/4 (TUBING) ×6 IMPLANT

## 2016-09-12 NOTE — Progress Notes (Signed)
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  Thoracic oncology consult placed.

## 2016-09-12 NOTE — Op Note (Signed)
Video Bronchoscopy with Endobronchial Ultrasound Procedure Note  Date of Operation: 09/12/2016  Pre-op Diagnosis: Mediastinal mass, left hilar lymphadenopathy  Post-op Diagnosis: Small Cell Carcinoma  Surgeon: Roselie Awkward  Assistants: none  Anesthesia: General endotracheal anesthesia  Operation: Flexible video fiberoptic bronchoscopy with endobronchial ultrasound and biopsies.  Estimated Blood Loss: less than 50   Complications: none immediate  Indications and History: Rebekah Henderson is a 60 y.o. female with a left mediastinal mass.  The risks, benefits, complications, treatment options and expected outcomes were discussed with the patient.  The possibilities of pneumothorax, pneumonia, reaction to medication, pulmonary aspiration, perforation of a viscus, bleeding, failure to diagnose a condition and creating a complication requiring transfusion or operation were discussed with the patient who freely signed the consent.    Description of Procedure: The patient was examined in the preoperative area and history and data from the preprocedure consultation were reviewed. It was deemed appropriate to proceed.  The patient was taken to [place] number 10, identified as Rebekah Henderson and the procedure verified as Flexible Video Fiberoptic Bronchoscopy.  A Time Out was held and the above information confirmed. After being taken to the operating room general anesthesia was initiated and the patient  was orally intubated. The video fiberoptic bronchoscope was introduced via the endotracheal tube and a general inspection was performed which showed studding and non-obstructive polypoid lesions in the left mainstem bronchus. The standard scope was then withdrawn and the endobronchial ultrasound was used to identify and characterize the peritracheal, hilar and bronchial lymph nodes. Inspection showed a large mediastinal mass in the left pretracheal region as well as lymphadenopathy at 10L.  Using real-time ultrasound guidance Wang needle biopsies were take from Station 10L nodes and the left mediastinal mass and were sent for cytology.  Brushings and endobronchial biopsies were taken from the left mainstem studding. The patient tolerated the procedure well without apparent complications. There was no significant blood loss. The bronchoscope was withdrawn. Anesthesia was reversed and the patient was taken to the PACU for recovery.   Samples: 1. Wang needle biopsies from 10L node 2. Wang needle biopsies from mediastinal mass 3. Brushing from the left mainstem endobronchial polypoid lesions 4. Biopsies from the left mainstem endobronchial polypoid lesions  Plans:  The patient will be discharged from the PACU to home when recovered from anesthesia. We will review the cytology, pathology and microbiology results with the patient when they become available. Outpatient followup will be with Marveline Profeta.    Roselie Awkward, MD [place] [facility] Pager: [phone] Cell: [phone] After 3pm or if no response, call [phone]  09/12/2016'@3'$ :41 PM

## 2016-09-12 NOTE — Telephone Encounter (Signed)
Called spoke with pt. She states that she already spoke with a nurse and that her questions had already been answered. She states nothing further is needed. Nothing further needed

## 2016-09-12 NOTE — H&P (Signed)
[facility]  HPI: Ms. Rebekah Henderson saw me in the office earlier this week due to a large left sided mass in her mediastinum.  She is here today for an endobronchial ultrasound guided biopsy.  Family History  Problem Relation Age of Onset  . Breast cancer Mother   . Diabetes Maternal Grandfather   . Lung cancer Father   . Heart failure Sister 51    Died. Morbidly obese  . Colon cancer Neg Hx   . Colon polyps Neg Hx      Social History   Social History  . Marital status: Divorced    Spouse name: N/A  . Number of children: 1  . Years of education: College    Occupational History  . Retired     Social History Main Topics  . Smoking status: Former Smoker    Packs/day: 2.00    Years: 20.00    Quit date: 05/20/2004  . Smokeless tobacco: Never Used  . Alcohol use No  . Drug use: No  . Sexual activity: Not on file   Other Topics Concern  . Not on file   Social History Narrative   Lives alone.  Retired from [employer] Reactions  . Penicillins Hives and Swelling    Has patient had a PCN reaction causing immediate rash, facial/tongue/throat swelling, SOB or lightheadedness with hypotension:unsure  Has patient had a PCN reaction causing severe rash involving mucus membranes or skin necrosis:unsure Has patient had a PCN reaction that required hospitalization:No Has patient had a PCN reaction occurring within the last 10 years:No If all of the above answers are "NO", then may proceed with Cephalosporin use.   . Codeine Nausea Only  . Ranitidine Hcl Other (See Comments)    dizzy  . Keflex [Cephalexin] Other (See Comments)    UNSPECIFIED REACTION   . Lyrica [Pregabalin] Other (See Comments)    lethargic     '@encmedstart'$ @  Exam: Vitals:   09/11/16 1351 09/12/16 1154  BP:  (!) 173/90  Pulse:  63  Resp:  18  Temp:  97.9 F (36.6 C)  TempSrc:  Oral  SpO2:  96%  Weight: 195 lb (88.5 kg) 195 lb (88.5 kg)  Height: '5\' 4"'$  (1.626 m) '5\' 4"'$  (1.626 m)    Gen: well appearing HENT: OP clear, neck supple PULM: CTA B, normal percussion CV: RRR, no mgr, trace edema GI: BS+, soft, nontender Derm: no cyanosis or rash Psyche: normal mood and affect  CBC    Component Value Date/Time   WBC 9.8 09/10/2016 1232   RBC 4.99 09/10/2016 1232   HGB 14.4 09/10/2016 1232   HCT 42.5 09/10/2016 1232   PLT 344.0 09/10/2016 1232   MCV 85.1 09/10/2016 1232   MCH 28.5 03/10/2014 1000   MCHC 34.0 09/10/2016 1232   RDW 14.1 09/10/2016 1232   LYMPHSABS 2.6 09/10/2016 1232   MONOABS 0.5 09/10/2016 1232   EOSABS 0.2 09/10/2016 1232   BASOSABS 0.1 09/10/2016 1232   09/2016 CT chest > 5.8x4.6 cm soft tissue mass in the AP window.  Associated with mass effect on the left main pulmonary artery.  Also there is a mass vs adenopathy in the left hilum 2.3 x 2.0 cm in size.    Impression/Plan:  Mediastinal mass in a smoker> very worrisome for small cell lung cancer.  Plan endobronchial ultrasound guided biopsy today.  Roselie Awkward, MD [place] [facility] Pager: [phone] Cell: [phone] After 3pm or if no response, call  [phone]  

## 2016-09-12 NOTE — Transfer of Care (Signed)
Immediate Anesthesia Transfer of Care Note  Patient: Rebekah Henderson  Procedure(s) Performed: Procedure(s): VIDEO BRONCHOSCOPY WITH ENDOBRONCHIAL ULTRASOUND AND BIOPSY  Patient Location: PACU  Anesthesia Type:General  Level of Consciousness: awake, alert , oriented and patient cooperative  Airway & Oxygen Therapy: Patient Spontanous Breathing and Patient connected to nasal cannula oxygen  Post-op Assessment: Report given to RN, Post -op Vital signs reviewed and stable and Patient moving all extremities  Post vital signs: Reviewed and stable  Last Vitals:  Vitals:   09/12/16 1549 09/12/16 1550  BP: (!) 152/97   Pulse: 94   Resp: 12   Temp:  36.1 C    Last Pain:  Vitals:   09/12/16 1550  TempSrc:   PainSc: 0-No pain      Patients Stated Pain Goal: 2 (18/84/16 6063)  Complications: No apparent anesthesia complications

## 2016-09-12 NOTE — Anesthesia Postprocedure Evaluation (Signed)
Anesthesia Post Note  Patient: Rebekah Henderson  Procedure(s) Performed: Procedure(s): VIDEO BRONCHOSCOPY WITH ENDOBRONCHIAL ULTRASOUND AND BIOPSY  Patient location during evaluation: PACU Anesthesia Type: General Level of consciousness: awake and alert and oriented Pain management: pain level controlled Vital Signs Assessment: post-procedure vital signs reviewed and stable Respiratory status: spontaneous breathing, nonlabored ventilation and respiratory function stable Cardiovascular status: blood pressure returned to baseline and stable Postop Assessment: no signs of nausea or vomiting Anesthetic complications: no    Last Vitals:  Vitals:   09/12/16 1630 09/12/16 1631  BP:  (!) 166/92  Pulse: 95 92  Resp:    Temp:      Last Pain:  Vitals:   09/12/16 1620  TempSrc:   PainSc: 0-No pain                 Nickalus Thornsberry A.

## 2016-09-12 NOTE — Telephone Encounter (Signed)
Pt returning call about procedure this morning.Rebekah Henderson

## 2016-09-12 NOTE — Anesthesia Preprocedure Evaluation (Signed)
Anesthesia Evaluation  Patient identified by MRN, date of birth, ID band Patient awake    Reviewed: Allergy & Precautions, NPO status , Patient's Chart, lab work & pertinent test results, reviewed documented beta blocker date and time   History of Anesthesia Complications (+) PONV and history of anesthetic complications  Airway Mallampati: II  TM Distance: >3 FB Neck ROM: Full    Dental no notable dental hx. (+) Teeth Intact   Pulmonary shortness of breath and with exertion, COPD,  COPD inhaler, former smoker,  Lung Mass   Pulmonary exam normal breath sounds clear to auscultation       Cardiovascular hypertension, Pt. on medications and Pt. on home beta blockers Normal cardiovascular exam Rhythm:Regular Rate:Normal     Neuro/Psych Anxiety    GI/Hepatic Neg liver ROS, GERD  Medicated and Controlled,IBS   Endo/Other  Hypothyroidism Obesity  Renal/GU negative Renal ROS  negative genitourinary   Musculoskeletal  (+) Arthritis , Fibromyalgia -  Abdominal   Peds  Hematology negative hematology ROS (+)   Anesthesia Other Findings   Reproductive/Obstetrics                             Anesthesia Physical Anesthesia Plan  ASA: III  Anesthesia Plan: General   Post-op Pain Management:    Induction: Intravenous  Airway Management Planned: Oral ETT  Additional Equipment:   Intra-op Plan:   Post-operative Plan: Extubation in OR  Informed Consent: I have reviewed the patients History and Physical, chart, labs and discussed the procedure including the risks, benefits and alternatives for the proposed anesthesia with the patient or authorized representative who has indicated his/her understanding and acceptance.   Dental advisory given  Plan Discussed with: Anesthesiologist, CRNA and Surgeon  Anesthesia Plan Comments:         Anesthesia Quick Evaluation

## 2016-09-12 NOTE — Anesthesia Procedure Notes (Signed)
Procedure Name: Intubation Date/Time: 09/12/2016 2:27 PM Performed by: ,  L Pre-anesthesia Checklist: Patient identified, Emergency Drugs available, Suction available and Patient being monitored Patient Re-evaluated:Patient Re-evaluated prior to inductionOxygen Delivery Method: Circle System Utilized Preoxygenation: Pre-oxygenation with 100% oxygen Intubation Type: IV induction Ventilation: Mask ventilation without difficulty Laryngoscope Size: Mac and 3 Grade View: Grade I Tube type: Oral Tube size: 8.5 mm Number of attempts: 1 Airway Equipment and Method: Stylet,  Oral airway and LTA kit utilized Placement Confirmation: ETT inserted through vocal cords under direct vision,  positive ETCO2 and breath sounds checked- equal and bilateral Secured at: 20 cm Tube secured with: Tape Dental Injury: Teeth and Oropharynx as per pre-operative assessment        

## 2016-09-13 ENCOUNTER — Encounter: Payer: Self-pay | Admitting: *Deleted

## 2016-09-13 ENCOUNTER — Telehealth: Payer: Self-pay | Admitting: Pulmonary Disease

## 2016-09-13 ENCOUNTER — Telehealth: Payer: Self-pay | Admitting: *Deleted

## 2016-09-13 ENCOUNTER — Encounter (HOSPITAL_COMMUNITY): Payer: Self-pay | Admitting: Pulmonary Disease

## 2016-09-13 DIAGNOSIS — J9859 Other diseases of mediastinum, not elsewhere classified: Secondary | ICD-10-CM

## 2016-09-13 NOTE — Telephone Encounter (Signed)
Oncology Nurse Navigator Documentation  Oncology Nurse Navigator Flowsheets 09/13/2016  Navigator Encounter Type Telephone;Introductory phone call/I received referral on Ms. Bowditch.  I called to schedule an appt.  I was unable to reach.  I left my name and phone number to call  Telephone Outgoing Call  Treatment Phase Pre-Tx/Tx Discussion  Barriers/Navigation Needs Coordination of Care  Interventions Coordination of Care  Coordination of Care Appts  Acuity Level 1  Acuity Level 1 Initial guidance, education and coordination as needed  Time Spent with Patient 30

## 2016-09-13 NOTE — Telephone Encounter (Signed)
Called and spoke with pt and she stated that she had the biopsy done yesterday and she stated that BQ seemed anxious to get her in for appt ASAP.  BQ please advise where to add the pt, you are booked this week.   Pt also wanted to know if this was in her esophagus and windpipe?  She apologized for asking again, but said that she was just so shocked yesterday with the results.  Please advise. thanks

## 2016-09-13 NOTE — Telephone Encounter (Signed)
Will send message to Dr. Melvyn Novas as DOD - BQ is unavailable today.

## 2016-09-13 NOTE — Telephone Encounter (Signed)
She's fine and no immediate concerns-  ust wanted to know about logistics but already set up with Dr Earlie Server  I did not elablorate on the tissue dx or staging or prognosis but defer that to Dr Farrel Conners

## 2016-09-13 NOTE — Progress Notes (Signed)
Oncology Nurse Navigator Documentation  Oncology Nurse Navigator Flowsheets 09/13/2016  Navigator Encounter Type Telephone  Telephone Incoming Call/Ms. Pugmire called me back. I updated her on appt for [place] on 09/19/16 arrive at 12:30.  She verbalized understanding of appt time and place.   Treatment Phase Pre-Tx/Tx Discussion  Barriers/Navigation Needs Coordination of Care  Interventions Coordination of Care  Coordination of Care Appts  Acuity Level 2  Acuity Level 2 Assistance expediting appointments  Time Spent with Patient 30

## 2016-09-13 NOTE — Telephone Encounter (Signed)
Mailed [facility] letter to pt.  

## 2016-09-15 NOTE — Telephone Encounter (Signed)
Lung cancer Have made arrangements with [facility]

## 2016-09-17 DIAGNOSIS — I10 Essential (primary) hypertension: Secondary | ICD-10-CM | POA: Diagnosis not present

## 2016-09-17 DIAGNOSIS — Z23 Encounter for immunization: Secondary | ICD-10-CM | POA: Diagnosis not present

## 2016-09-17 DIAGNOSIS — F419 Anxiety disorder, unspecified: Secondary | ICD-10-CM | POA: Diagnosis not present

## 2016-09-17 DIAGNOSIS — C3490 Malignant neoplasm of unspecified part of unspecified bronchus or lung: Secondary | ICD-10-CM | POA: Diagnosis not present

## 2016-09-17 DIAGNOSIS — E6609 Other obesity due to excess calories: Secondary | ICD-10-CM | POA: Diagnosis not present

## 2016-09-17 DIAGNOSIS — E063 Autoimmune thyroiditis: Secondary | ICD-10-CM | POA: Diagnosis not present

## 2016-09-17 DIAGNOSIS — E669 Obesity, unspecified: Secondary | ICD-10-CM | POA: Diagnosis not present

## 2016-09-17 DIAGNOSIS — Z6834 Body mass index (BMI) 34.0-34.9, adult: Secondary | ICD-10-CM | POA: Diagnosis not present

## 2016-09-18 ENCOUNTER — Other Ambulatory Visit: Payer: Self-pay | Admitting: Internal Medicine

## 2016-09-18 ENCOUNTER — Inpatient Hospital Stay
Admission: RE | Admit: 2016-09-18 | Discharge: 2016-09-18 | Disposition: A | Discharge status: Home / Self Care | Payer: Self-pay | Source: Ambulatory Visit | Attending: Internal Medicine | Admitting: Internal Medicine

## 2016-09-18 ENCOUNTER — Encounter: Payer: Self-pay | Admitting: *Deleted

## 2016-09-18 DIAGNOSIS — C801 Malignant (primary) neoplasm, unspecified: Secondary | ICD-10-CM

## 2016-09-18 NOTE — Progress Notes (Signed)
CT Chest [facility] 09/02/16  IMPRESSION:  2.3 cm central left lower lobe mass versus hilar adenopathy .Large soft tissue mass in the aorticopulmonary window. The appearance suggests possible involvement of the esophageal wall but with still favor primary bronchogenic malignancy with invasion  over esophageal malignancy or lymphoma.  Result Narrative  COMPARISON: None. INDICATION: R91.8: Other nonspecific abnormal finding of lung field TECHNIQUE:CT CHEST W CONTRAST - Radiation dose reduction was utilized (automated exposure control, mA or kV adjustment based on patient size, or iterative image reconstruction). Exam date/time: 09/02/2016 9:35 AM   FINDINGS: There is a 5.8 x 4.6 cm soft tissue mass in the aorticopulmonary window. This is associated with mass effect upon the left main pulmonary artery and left mainstem bronchus but does not appear to be arising from the bronchus. It is contiguous  with the esophagus with no fat plane between the esophagus and the mass. There is a mass in the central aspect of the left lower lobe contiguous with the left hilum could represent a mass or adenopathy measuring 2.3 x 2.0 cm on series 2 image 34. There  is some atelectasis in the left lower lobe beyond this mass.  Mild atelectasis in the lingula. No effusions.  Postsurgical changes in the cervical spine. No focal bone lesions. Normal-appearing adrenal glands. Small hiatal hernia with a paraesophageal component. There are some mildly prominent left axillary lymph nodes of unclear significance.

## 2016-09-19 ENCOUNTER — Encounter: Payer: Self-pay | Admitting: Internal Medicine

## 2016-09-19 ENCOUNTER — Ambulatory Visit (HOSPITAL_BASED_OUTPATIENT_CLINIC_OR_DEPARTMENT_OTHER): Payer: Medicare Other | Admitting: Internal Medicine

## 2016-09-19 ENCOUNTER — Ambulatory Visit: Payer: Medicare Other | Attending: Internal Medicine | Admitting: Physical Therapy

## 2016-09-19 ENCOUNTER — Ambulatory Visit
Admission: RE | Admit: 2016-09-19 | Discharge: 2016-09-19 | Disposition: A | Discharge status: Home / Self Care | Payer: Medicare Other | Source: Ambulatory Visit | Attending: Radiation Oncology | Admitting: Radiation Oncology

## 2016-09-19 ENCOUNTER — Other Ambulatory Visit (HOSPITAL_BASED_OUTPATIENT_CLINIC_OR_DEPARTMENT_OTHER): Payer: Medicare Other

## 2016-09-19 DIAGNOSIS — R29898 Other symptoms and signs involving the musculoskeletal system: Secondary | ICD-10-CM | POA: Diagnosis not present

## 2016-09-19 DIAGNOSIS — E039 Hypothyroidism, unspecified: Secondary | ICD-10-CM

## 2016-09-19 DIAGNOSIS — J449 Chronic obstructive pulmonary disease, unspecified: Secondary | ICD-10-CM

## 2016-09-19 DIAGNOSIS — C3432 Malignant neoplasm of lower lobe, left bronchus or lung: Secondary | ICD-10-CM

## 2016-09-19 DIAGNOSIS — Z87891 Personal history of nicotine dependence: Secondary | ICD-10-CM | POA: Diagnosis not present

## 2016-09-19 DIAGNOSIS — C3492 Malignant neoplasm of unspecified part of left bronchus or lung: Secondary | ICD-10-CM

## 2016-09-19 DIAGNOSIS — J9859 Other diseases of mediastinum, not elsewhere classified: Secondary | ICD-10-CM

## 2016-09-19 DIAGNOSIS — R2681 Unsteadiness on feet: Secondary | ICD-10-CM | POA: Diagnosis not present

## 2016-09-19 DIAGNOSIS — R293 Abnormal posture: Secondary | ICD-10-CM

## 2016-09-19 DIAGNOSIS — C801 Malignant (primary) neoplasm, unspecified: Secondary | ICD-10-CM

## 2016-09-19 LAB — CBC WITH DIFFERENTIAL/PLATELET
BASO%: 0.1 % (ref 0.0–2.0)
Basophils Absolute: 0 10*3/uL (ref 0.0–0.1)
EOS%: 3.1 % (ref 0.0–7.0)
Eosinophils Absolute: 0.3 10*3/uL (ref 0.0–0.5)
HEMATOCRIT: 42.4 % (ref 34.8–46.6)
HEMOGLOBIN: 14.4 g/dL (ref 11.6–15.9)
LYMPH#: 1.9 10*3/uL (ref 0.9–3.3)
LYMPH%: 22.5 % (ref 14.0–49.7)
MCH: 29.1 pg (ref 25.1–34.0)
MCHC: 34 g/dL (ref 31.5–36.0)
MCV: 85.7 fL (ref 79.5–101.0)
MONO#: 0.7 10*3/uL (ref 0.1–0.9)
MONO%: 8.2 % (ref 0.0–14.0)
NEUT%: 66.1 % (ref 38.4–76.8)
NEUTROS ABS: 5.5 10*3/uL (ref 1.5–6.5)
PLATELETS: 299 10*3/uL (ref 145–400)
RBC: 4.95 10*6/uL (ref 3.70–5.45)
RDW: 13.8 % (ref 11.2–14.5)
WBC: 8.3 10*3/uL (ref 3.9–10.3)

## 2016-09-19 LAB — COMPREHENSIVE METABOLIC PANEL
ALT: 10 U/L (ref 0–55)
AST: 18 U/L (ref 5–34)
Albumin: 3.6 g/dL (ref 3.5–5.0)
Alkaline Phosphatase: 54 U/L (ref 40–150)
Anion Gap: 11 mEq/L (ref 3–11)
BILIRUBIN TOTAL: 0.53 mg/dL (ref 0.20–1.20)
BUN: 9.2 mg/dL (ref 7.0–26.0)
CALCIUM: 9.1 mg/dL (ref 8.4–10.4)
CHLORIDE: 104 meq/L (ref 98–109)
CO2: 25 meq/L (ref 22–29)
CREATININE: 0.7 mg/dL (ref 0.6–1.1)
EGFR: >90 mL/min/{1.73_m2} (ref >90)
Glucose: 91 mg/dl (ref 70–140)
Potassium: 3.9 mEq/L (ref 3.5–5.1)
Sodium: 139 mEq/L (ref 136–145)
TOTAL PROTEIN: 7.4 g/dL (ref 6.4–8.3)

## 2016-09-19 MED ORDER — PROCHLORPERAZINE MALEATE 10 MG PO TABS: 10.0000 mg | ORAL_TABLET | Freq: Four times a day (QID) | ORAL | 0 refills | Status: DC | PRN

## 2016-09-19 NOTE — Progress Notes (Signed)
Radiation Oncology         [phone] ________________________________  Multidisciplinary Thoracic Oncology Clinic [facility]) Initial Outpatient Consultation  Name: Rebekah Henderson MRN: [id number]  Date: 09/19/2016  DOB: 03/17/1956  AL:PFXTK,WIOXBDZH J., MD  Juanito Doom, MD   REFERRING PHYSICIAN: Juanito Doom, MD  DIAGNOSIS: The encounter diagnosis was Cancer [facility]).    ICD-9-CM ICD-10-CM   1. Cancer (HCC) 199.1 C80.1     HISTORY OF PRESENT ILLNESS: Rebekah Henderson is a 60 y.o. female seen at the request of Dr. Lake Bells for a new diagnosis of small cell lung cancer. The patient was found to have symptoms of pneumonia and had been treated with two courses of antibiotics without improvment. She underwent a CT scan on 09/02/16 revealing a left hilar mass 2.6 x 2.9 cm (with a 4.3 cm cranio-caudal view in the AP window). This is associated with mass effect upon the left main pulmonary artery and left mainstem bronchus but does not appear to be arising from the bronchus. It is contiguous with the esophagus with no fat plane between the esophagus and the mass. There is a mass in the central aspect of the left lower lobe contiguous with the left hilum could represent a mass or adenopathy measuring 2.3 x 2.0 cm. 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 She comes today for further discussions of care.     PREVIOUS RADIATION THERAPY: No  PAST MEDICAL HISTORY:  Past Medical History:  Diagnosis Date  . Anxiety    takes Prozac daily  . Arthritis   . Arthritis   . Cancer ([place])    skin   . Colon polyps   . COPD (chronic obstructive pulmonary disease) ([place])   . Diverticulitis   . Dyspnea    with exertion  . Fibromyalgia   . GERD (gastroesophageal reflux disease)    takes Pantoprazole daily  . Hypertension    takes Metoprolol,Triamterene-HCTZ,and Amlodipine daily  . Hypothyroidism    takes Synthroid daily  . IBS (irritable  bowel syndrome)   . PONV (postoperative nausea and vomiting)    pt also states that she had some difficulty breathing after cervical fusion      PAST SURGICAL HISTORY: Past Surgical History:  Procedure Laterality Date  . BIOPSY N/A 05/25/2013   Procedure: BIOPSIES (Random Colon; Duodenal; Gastric);  Surgeon: Danie Binder, MD;  Location: [facility];  Service: Endoscopy;  Laterality: N/A;  . BLADDER SUSPENSION    . BREAST ENHANCEMENT SURGERY    . BREAST IMPLANT REMOVAL    . CERVICAL FUSION  AUG 2013  . CHOLECYSTECTOMY  1999  . COLONOSCOPY  2007 [place]   POLYPS  . COLONOSCOPY WITH PROPOFOL N/A 05/25/2013   Procedure: COLONOSCOPY WITH PROPOFOL(at cecum 0957) total withdrawal time=28mn);  Surgeon: SDanie Binder MD;  Location: [facility];  Service: Endoscopy;  Laterality: N/A;  . ESOPHAGOGASTRODUODENOSCOPY (EGD) WITH PROPOFOL N/A 05/25/2013   Procedure: ESOPHAGOGASTRODUODENOSCOPY (EGD) WITH PROPOFOL;  Surgeon: SDanie Binder MD;  Location: [facility];  Service: Endoscopy;  Laterality: N/A;  . FOOT SURGERY    . POLYPECTOMY N/A 05/25/2013   Procedure: POLYPECTOMY (Rectal and Gastric);  Surgeon: SDanie Binder MD;  Location: [facility];  Service: Endoscopy;  Laterality: N/A;  . TONSILLECTOMY    . UPPER GASTROINTESTINAL ENDOSCOPY    . VIDEO BRONCHOSCOPY WITH ENDOBRONCHIAL ULTRASOUND  09/12/2016   Procedure: VIDEO BRONCHOSCOPY WITH ENDOBRONCHIAL ULTRASOUND AND BIOPSY;  Surgeon: DRonie Spies  Lake Bells, MD;  Location: [place];  Service: Cardiopulmonary;;    FAMILY HISTORY:  Family History  Problem Relation Age of Onset  . Breast cancer Mother   . Diabetes Maternal Grandfather   . Lung cancer Father   . Heart failure Sister 63    Died. Morbidly obese  . Colon cancer Neg Hx   . Colon polyps Neg Hx     SOCIAL HISTORY:  Social History   Social History  . Marital status: Divorced    Spouse name: N/A  . Number of children: 1  . Years of education: College    Occupational History  . Retired     Social  History Main Topics  . Smoking status: Former Smoker    Packs/day: 2.00    Years: 20.00    Quit date: 05/20/2004  . Smokeless tobacco: Never Used  . Alcohol use No  . Drug use: No  . Sexual activity: Not on file   Other Topics Concern  . Not on file   Social History Narrative   Lives alone.  Retired from [employer]  The patient lives in [place], [place].  ALLERGIES: Penicillins; Codeine; Ranitidine hcl; Keflex [cephalexin]; and Lyrica [pregabalin]  MEDICATIONS:  Current Outpatient Prescriptions  Medication Sig Dispense Refill  . albuterol (PROVENTIL) 2 MG tablet Take 2 mg by mouth daily as needed for wheezing or shortness of breath.     [place] Kitchen amLODipine (NORVASC) 5 MG tablet Take 5 mg by mouth daily.      [place] Kitchen aspirin EC 81 MG tablet Take 81 mg by mouth daily.    [place] Kitchen dicyclomine (BENTYL) 20 MG tablet Take 20 mg by mouth 3 (three) times daily as needed (for stomach upset).    [place] Kitchen estazolam (PROSOM) 2 MG tablet Take 2 mg by mouth at bedtime.    [place] Kitchen estradiol (ESTRACE) 2 MG tablet Take 2 mg by mouth daily.      [place] Kitchen FLUoxetine (PROZAC) 40 MG capsule Take 40 mg by mouth daily.   2  . HYDROcodone-acetaminophen (NORCO/VICODIN) 5-325 MG per tablet Take 1 tablet by mouth every 6 (six) hours as needed for pain.     [place] Kitchen levothyroxine (SYNTHROID, LEVOTHROID) 25 MCG tablet Take 25 mcg by mouth daily.      . metoprolol succinate (TOPROL-XL) 25 MG 24 hr tablet Take 25 mg by mouth daily.    . ondansetron (ZOFRAN) 4 MG tablet Take 1 tablet (4 mg total) by mouth every 6 (six) hours. 12 tablet 0  . pantoprazole (PROTONIX) 40 MG tablet Take 40 mg by mouth 2 (two) times daily.     [place] Kitchen PROAIR HFA 108 (90 BASE) MCG/ACT inhaler Inhale 1-2 puffs into the lungs every 4 (four) hours as needed.   0  . prochlorperazine (COMPAZINE) 10 MG tablet Take 1 tablet (10 mg total) by mouth every 6 (six) hours as needed for nausea or vomiting. 30 tablet 0  . triamterene-hydrochlorothiazide (DYAZIDE) 37.5-25 MG per capsule Take 1 capsule  by mouth every morning.       No current facility-administered medications for this encounter.     REVIEW OF SYSTEMS:  On review of systems, the patient reports that she is doing well overall but still in shock with her new diagnosis. She does note intermittent pleuritic chest pain, shortness of breath with exertion, fatigue, and a nonproductive cough. She denies fevers, chills, night sweats, unintended weight changes. She denies any bowel or bladder disturbances, and denies abdominal pain, nausea or vomiting. She denies any new  musculoskeletal or joint aches or pains. A complete review of systems is obtained and is otherwise negative.    PHYSICAL EXAM:  Wt Readings from Last 3 Encounters:  09/19/16 191 lb 3.2 oz (86.7 kg)  09/12/16 195 lb (88.5 kg)  09/10/16 195 lb (88.5 kg)   Temp Readings from Last 3 Encounters:  09/19/16 97.7 F (36.5 C) (Oral)  09/12/16 97 F (36.1 C)  03/10/14 98.1 F (36.7 C) (Oral)   BP Readings from Last 3 Encounters:  09/19/16 (!) 143/81  09/12/16 (!) 166/92  09/10/16 (!) 142/76   Pulse Readings from Last 3 Encounters:  09/19/16 66  09/12/16 92  09/10/16 62    Pain Scale 0/10 In general this is a well appearing Caucasian female in no acute distress. She is alert and oriented x4 and appropriate throughout the examination. HEENT reveals that the patient is normocephalic, atraumatic. EOMs are intact. PERRLA. Skin is intact without any evidence of gross lesions. Cardiovascular exam reveals a regular rate and rhythm, no clicks rubs or murmurs are auscultated. Chest is clear to auscultation bilaterally. Lymphatic assessment is performed and does not reveal any adenopathy in the cervical, supraclavicular, axillary, or inguinal chains. Abdomen has active bowel sounds in all quadrants and is intact. The abdomen is soft, non tender, non distended. Lower extremities are negative for pretibial pitting edema, deep calf tenderness, cyanosis or clubbing.   KPS =  80  100 - Normal; no complaints; no evidence of disease. 90   - Able to carry on normal activity; minor signs or symptoms of disease. 80   - Normal activity with effort; some signs or symptoms of disease. 10   - Cares for self; unable to carry on normal activity or to do active work. 60   - Requires occasional assistance, but is able to care for most of his personal needs. 50   - Requires considerable assistance and frequent medical care. 62   - Disabled; requires special care and assistance. 107   - Severely disabled; hospital admission is indicated although death not imminent. 59   - Very sick; hospital admission necessary; active supportive treatment necessary. 10   - Moribund; fatal processes progressing rapidly. 0     - Dead  Karnofsky DA, Abelmann Page, Craver LS and [place] [facility] [phone]) The use of the nitrogen mustards in the palliative treatment of carcinoma: with particular reference to bronchogenic carcinoma Cancer 1 634-56  LABORATORY DATA:  Lab Results  Component Value Date   WBC 8.3 09/19/2016   HGB 14.4 09/19/2016   HCT 42.4 09/19/2016   MCV 85.7 09/19/2016   PLT 299 09/19/2016   Lab Results  Component Value Date   NA 139 09/19/2016   K 3.9 09/19/2016   CL 103 09/12/2016   CO2 25 09/19/2016   Lab Results  Component Value Date   ALT 10 09/19/2016   AST 18 09/19/2016   ALKPHOS 54 09/19/2016   BILITOT 0.53 09/19/2016     RADIOGRAPHY: Dg Chest 2 View  Result Date: 08/27/2016 CLINICAL DATA:  Shortness of breath, midchest pain, bilateral rib pain with cough and chest congestion for the past 2 weeks. EXAM: CHEST  2 VIEW COMPARISON:  PA and lateral chest x-ray of October 02, 2015 FINDINGS: The lungs are adequately inflated. There is new increased density anteriorly at the right lung base. The left lung is clear. There is a double density in the AP window region. There is no pleural effusion or pneumothorax. The patient has undergone previous  lower anterior cervical fusion.  The heart is normal in size. The pulmonary vascularity is not engorged. The bony thorax exhibits no acute abnormality. IMPRESSION: Abnormal soft tissue masslike density at the right lung base anteriorly. This is worrisome for malignancy though atypical pneumonia could produce a similar finding. Abnormal soft tissue density in the AP window region is worrisome for lymphadenopathy. Chest CT scanning now is recommended. These results will be called to the ordering clinician or representative by the Radiologist Assistant, and communication documented in the PACS or zVision Dashboard. Electronically Signed   By: David  Martinique M.D.   On: 08/27/2016 14:08   Ct Outside Films Chest  Result Date: 09/18/2016 CLINICAL DATA:  This exam is stored here for comparison purposes only and was performed at an outside facility.   Please contact the originating institution for any associated interpretation or report.      IMPRESSION/PLAN: 40. 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 Today, we spoke with the patient and family about the findings and work-up thus far.  We discussed the natural history of small cell lung cancer and general treatment, highlighting the role of radiotherapy in the management.  She will undergo staging PET scan and MRI of the brain.We discussed the available radiation techniques, and focused on the details of logistics and delivery.  We reviewed the anticipated acute and late sequelae associated with radiation in this setting. Dr. Tammi Klippel has outlined a course of 6 1/2 weeks of daily radiotherapy to total about 33 fractions to the left lung. She will receive concurrent chemotherapy as well and is interested in proceeding with treatment in [place]. She is scheduled for simulation in [place] next Wednesday, 09/25/2016.  2. Risk of brain metastases. Dr. Tammi Klippel and I also spoke with the patient to discuss the role for PCI once she completes primary chemo/radiation provided  that she remains limited stage and provided she does not have disease already on staging MRI. We did discuss the recommendations for whole brain radiation in this setting and will further outline this at the appropriate time. The patient states understanding however of the role for this at the end of the conversation.  The above documentation reflects my direct findings during this shared patient visit. Please see the separate note by Dr. Tammi Klippel on this date for the remainder of the patient's plan of care.   Carola Rhine, PAC

## 2016-09-19 NOTE — Progress Notes (Signed)
[place] [phone]   [phone] Multidisciplinary thoracic oncology clinic  CONSULT NOTE  REFERRING PHYSICIAN: Dr. Simonne Maffucci  REASON FOR CONSULTATION:  60 years old white female recently diagnosed with lung cancer.  HPI Rebekah Henderson is a 60 y.o. female with past medical history significant for COPD, GERD, fibromyalgia, hypertension, hypothyroidism, arthritis as well as irritable bowel syndrome. The patient also has a long history of heavy smoking but quit in 2009. Over the last 2 months she has been complaining of shortness of breath. She was treated for pneumonia with 2 courses of antibiotics with no improvement in her condition. She was seen by her primary care physician Dr. Gerarda Fraction. He ordered chest x-ray on 08/27/2016 and it showed abnormal soft tissue masslike density at the right lung base anteriorly. This was worrisome for malignancy. There was also abnormal soft tissue density in the AP window region worrisome for lymphadenopathy. The patient then had CT scan of the chest on 09/02/2016 and it showed a 5.8 x 4.6 cm soft tissue mass in the aorticopulmonary window. This is associated with mass effect upon the left main pulmonary artery and left mainstem bronchus but does not appear to be arising from the bronchus. It is contiguous  with the esophagus with no fat plane between the esophagus and the mass. There is a mass in the central aspect of the left lower lobe contiguous with the left hilum could represent a mass or adenopathy measuring 2.3 x 2.0 cm.  The patient was referred to Dr. Lake Bells and on 09/12/2016 she underwent flexible video fiberoptic bronchoscopy with endobronchial ultrasound and biopsies of the left mainstem bronchial polypoid lesions and the mediastinal mass. The final pathology case # [id number] showed a small cell carcinoma. Dr. Lake Bells kindly referred the patient to the multidisciplinary thoracic oncology clinic today for  further evaluation and recommendation regarding treatment of her condition. When seen today the patient is very anxious. She continues to complain of substernal chest pain as well as shortness breath at baseline and increased with exertion but no significant cough or hemoptysis. She lost around 15 pounds in the last 3 months. She also has headache as well as occasional nausea and diarrhea secondary to IBS. Family history significant for mother with breast cancer diagnosed at age 16 and father died from lung cancer at age 6. The patient is divorced and has one son. She was accompanied by her friend Rebekah Henderson. She used to work in [employer]. She has a history of smoking 2 pack per day for around 25 years and quit in 2009. She has no history of alcohol or drug abuse.  HPI  Past Medical History:  Diagnosis Date  . Anxiety    takes Prozac daily  . Arthritis   . Arthritis   . Cancer ([place])    skin   . Colon polyps   . COPD (chronic obstructive pulmonary disease) ([place])   . Diverticulitis   . Dyspnea    with exertion  . Fibromyalgia   . GERD (gastroesophageal reflux disease)    takes Pantoprazole daily  . Hypertension    takes Metoprolol,Triamterene-HCTZ,and Amlodipine daily  . Hypothyroidism    takes Synthroid daily  . IBS (irritable bowel syndrome)   . PONV (postoperative nausea and vomiting)    pt also states that she had some difficulty breathing after cervical fusion    Past Surgical History:  Procedure Laterality Date  . BIOPSY N/A 05/25/2013   Procedure: BIOPSIES (Random  Colon; Duodenal; Gastric);  Surgeon: Danie Binder, MD;  Location: [facility];  Service: Endoscopy;  Laterality: N/A;  . BLADDER SUSPENSION    . BREAST ENHANCEMENT SURGERY    . BREAST IMPLANT REMOVAL    . CERVICAL FUSION  AUG 2013  . CHOLECYSTECTOMY  1999  . COLONOSCOPY  2007 [place]   POLYPS  . COLONOSCOPY WITH PROPOFOL N/A 05/25/2013   Procedure: COLONOSCOPY WITH PROPOFOL(at cecum 0957) total  withdrawal time=68mn);  Surgeon: SDanie Binder MD;  Location: [facility];  Service: Endoscopy;  Laterality: N/A;  . ESOPHAGOGASTRODUODENOSCOPY (EGD) WITH PROPOFOL N/A 05/25/2013   Procedure: ESOPHAGOGASTRODUODENOSCOPY (EGD) WITH PROPOFOL;  Surgeon: SDanie Binder MD;  Location: [facility];  Service: Endoscopy;  Laterality: N/A;  . FOOT SURGERY    . POLYPECTOMY N/A 05/25/2013   Procedure: POLYPECTOMY (Rectal and Gastric);  Surgeon: SDanie Binder MD;  Location: [facility];  Service: Endoscopy;  Laterality: N/A;  . TONSILLECTOMY    . UPPER GASTROINTESTINAL ENDOSCOPY    . VIDEO BRONCHOSCOPY WITH ENDOBRONCHIAL ULTRASOUND  09/12/2016   Procedure: VIDEO BRONCHOSCOPY WITH ENDOBRONCHIAL ULTRASOUND AND BIOPSY;  Surgeon: DJuanito Doom MD;  Location[facility];  Service: Cardiopulmonary;;    Family History  Problem Relation Age of Onset  . Breast cancer Mother   . Diabetes Maternal Grandfather   . Lung cancer Father   . Heart failure Sister 436   Died. Morbidly obese  . Colon cancer Neg Hx   . Colon polyps Neg Hx     Social History Social History  Substance Use Topics  . Smoking status: Former Smoker    Packs/day: 2.00    Years: 20.00    Quit date: 05/20/2004  . Smokeless tobacco: Never Used  . Alcohol use No    Allergies  Allergen Reactions  . Penicillins Hives and Swelling    Has patient had a PCN reaction causing immediate rash, facial/tongue/throat swelling, SOB or lightheadedness with hypotension:unsure  Has patient had a PCN reaction causing severe rash involving mucus membranes or skin necrosis:unsure Has patient had a PCN reaction that required hospitalization:No Has patient had a PCN reaction occurring within the last 10 years:No If all of the above answers are "NO", then may proceed with Cephalosporin use.   . Codeine Nausea Only  . Ranitidine Hcl Other (See Comments)    dizzy  . Keflex [Cephalexin] Other (See Comments)    UNSPECIFIED REACTION   . Lyrica [Pregabalin] Other (See  Comments)    lethargic    Current Outpatient Prescriptions  Medication Sig Dispense Refill  . albuterol (PROVENTIL) 2 MG tablet Take 2 mg by mouth daily as needed for wheezing or shortness of breath.     .Marland KitchenamLODipine (NORVASC) 5 MG tablet Take 5 mg by mouth daily.      .Marland Kitchenaspirin EC 81 MG tablet Take 81 mg by mouth daily.    .Marland Kitchendicyclomine (BENTYL) 20 MG tablet Take 20 mg by mouth 3 (three) times daily as needed (for stomach upset).    .Marland Kitchenestazolam (PROSOM) 2 MG tablet Take 2 mg by mouth at bedtime.    .Marland Kitchenestradiol (ESTRACE) 2 MG tablet Take 2 mg by mouth daily.      .Marland KitchenFLUoxetine (PROZAC) 40 MG capsule Take 40 mg by mouth daily.   2  . HYDROcodone-acetaminophen (NORCO/VICODIN) 5-325 MG per tablet Take 1 tablet by mouth every 6 (six) hours as needed for pain.     .Marland Kitchenlevothyroxine (SYNTHROID, LEVOTHROID) 25 MCG tablet Take  25 mcg by mouth daily.      . metoprolol succinate (TOPROL-XL) 25 MG 24 hr tablet Take 25 mg by mouth daily.    . ondansetron (ZOFRAN) 4 MG tablet Take 1 tablet (4 mg total) by mouth every 6 (six) hours. 12 tablet 0  . pantoprazole (PROTONIX) 40 MG tablet Take 40 mg by mouth 2 (two) times daily.     Marland Kitchen PROAIR HFA 108 (90 BASE) MCG/ACT inhaler Inhale 1-2 puffs into the lungs every 4 (four) hours as needed.   0  . triamterene-hydrochlorothiazide (DYAZIDE) 37.5-25 MG per capsule Take 1 capsule by mouth every morning.      . prochlorperazine (COMPAZINE) 10 MG tablet Take 1 tablet (10 mg total) by mouth every 6 (six) hours as needed for nausea or vomiting. 30 tablet 0   No current facility-administered medications for this visit.     Review of Systems  Constitutional: positive for fatigue Eyes: negative Ears, nose, mouth, throat, and face: negative Respiratory: positive for dyspnea on exertion and pleurisy/chest pain Cardiovascular: negative Gastrointestinal: negative Genitourinary:negative Integument/breast: negative Hematologic/lymphatic:  negative Musculoskeletal:negative Neurological: positive for headaches Behavioral/Psych: negative Endocrine: negative Allergic/Immunologic: negative  Physical Exam  EUM:PNTIR, healthy, no distress, well nourished, well developed and anxious SKIN: skin color, texture, turgor are normal, no rashes or significant lesions HEAD: Normocephalic, No masses, lesions, tenderness or abnormalities EYES: normal, PERRLA, Conjunctiva are pink and non-injected EARS: External ears normal, Canals clear OROPHARYNX:no exudate, no erythema and lips, buccal mucosa, and tongue normal  NECK: supple, no adenopathy, no JVD LYMPH:  no palpable lymphadenopathy, no hepatosplenomegaly BREAST:not examined LUNGS: clear to auscultation , and palpation HEART: regular rate & rhythm, no murmurs and no gallops ABDOMEN:abdomen soft, non-tender, normal bowel sounds and no masses or organomegaly BACK: Back symmetric, no curvature., No CVA tenderness EXTREMITIES:no joint deformities, effusion, or inflammation, no edema, no skin discoloration  NEURO: alert & oriented x 3 with fluent speech, no focal motor/sensory deficits  PERFORMANCE STATUS: ECOG 1  LABORATORY DATA: Lab Results  Component Value Date   WBC 8.3 09/19/2016   HGB 14.4 09/19/2016   HCT 42.4 09/19/2016   MCV 85.7 09/19/2016   PLT 299 09/19/2016      Chemistry      Component Value Date/Time   NA 139 09/19/2016 1246   K 3.9 09/19/2016 1246   CL 103 09/12/2016 1219   CO2 25 09/19/2016 1246   BUN 9.2 09/19/2016 1246   CREATININE 0.7 09/19/2016 1246      Component Value Date/Time   CALCIUM 9.1 09/19/2016 1246   ALKPHOS 54 09/19/2016 1246   AST 18 09/19/2016 1246   ALT 10 09/19/2016 1246   BILITOT 0.53 09/19/2016 1246       RADIOGRAPHIC STUDIES: Dg Chest 2 View  Result Date: 08/27/2016 CLINICAL DATA:  Shortness of breath, midchest pain, bilateral rib pain with cough and chest congestion for the past 2 weeks. EXAM: CHEST  2 VIEW COMPARISON:   PA and lateral chest x-ray of October 02, 2015 FINDINGS: The lungs are adequately inflated. There is new increased density anteriorly at the right lung base. The left lung is clear. There is a double density in the AP window region. There is no pleural effusion or pneumothorax. The patient has undergone previous lower anterior cervical fusion. The heart is normal in size. The pulmonary vascularity is not engorged. The bony thorax exhibits no acute abnormality. IMPRESSION: Abnormal soft tissue masslike density at the right lung base anteriorly. This is worrisome for malignancy  though atypical pneumonia could produce a similar finding. Abnormal soft tissue density in the AP window region is worrisome for lymphadenopathy. Chest CT scanning now is recommended. These results will be called to the ordering clinician or representative by the Radiologist Assistant, and communication documented in the PACS or zVision Dashboard. Electronically Signed   By: David  Martinique M.D.   On: 08/27/2016 14:08   Ct Outside Films Chest  Result Date: 09/18/2016 CLINICAL DATA:  This exam is stored here for comparison purposes only and was performed at an outside facility.   Please contact the originating institution for any associated interpretation or report.    ASSESSMENT: This is a very pleasant 60 years old white female with recently diagnosed limited stage (T1b, N2, M0) small cell lung carcinoma, pending further staging workup. She presented with left lower lobe/infrahilar mass as well as large mediastinal lymphadenopathy diagnosed in October 2017.   PLAN: I had a lengthy discussion with the patient and her friend today about her current disease stage, prognosis and treatment options. I recommended for the patient to complete the staging workup by ordering a PET scan as well as MRI of the brain to rule out any metastatic disease. If the patient has no evidence for metastatic disease, I would consider her for treatment with  systemic chemotherapy with cisplatin 60 MG/M2 on day 1 and etoposide 120 MG/M2 on days 1, 2 and 3 concurrent with radiotherapy in the first 2 cycles. I discussed with the patient adverse effect of this treatment including but not limited to alopecia, myelosuppression, nausea and vomiting, peripheral neuropathy, liver or renal dysfunction. She is expected to start the first cycle of this treatment on 09/30/2016. I will call her pharmacy with prescription for Compazine 10 mg by mouth every 6 hours as needed for nausea. The patient was seen during the multidisciplinary thoracic oncology clinic today by medical oncology, radiation oncology, thoracic navigator, social worker and physical therapy. I will also arrange for the patient to have a chemotherapy education class before starting the first dose of her treatment. She would come back for follow-up visit on 09/06/2016 for evaluation and management of any adverse effect of her treatment. She was advised to call immediately if she has any concerning symptoms in the interval. The patient voices understanding of current disease status and treatment options and is in agreement with the current care plan.  All questions were answered. The patient knows to call the clinic with any problems, questions or concerns. We can certainly see the patient much sooner if necessary.  Thank you so much for allowing me to participate in the care of [employer]. I will continue to follow up the patient with you and assist in her care.  I spent 55 minutes counseling the patient face to face. The total time spent in the appointment was 80 minutes.  Disclaimer: This note was dictated with voice recognition software. Similar sounding words can inadvertently be transcribed and may not be corrected upon review.   Janmichael Giraud K. September 19, 2016, 2:10 PM

## 2016-09-19 NOTE — Progress Notes (Signed)
START ON PATHWAY REGIMEN - Small Cell Lung  LOS15: Etoposide Days 1, 2, 3 + Cisplatin Day 1 q21 Days x 4 Cycles with Concurrent Radiation**   A cycle is every 21 days:     Etoposide (Toposar(R)) 100 mg/m2 in a total of 500 mL NS IV over 2 hours days 1, 2, and 3 Dose Mod: None     Cisplatin (Platinol(R)) 75 mg/m2 in a total of 500 mL NS IV over 2 hours day 1 only. **Prehydrate and consider post-hydration** Dose Mod: None  **Always confirm dose/schedule in your pharmacy ordering system**    Patient Characteristics: Limited Stage, First Line Stage Grouping: Limited AJCC M Stage: 0 AJCC N Stage: 2 AJCC T Stage: 1 Line of therapy: First Line Would you be surprised if this patient died  in the next year? I would be surprised if this patient died in the next year  Intent of Therapy: Curative Intent, Discussed with Patient

## 2016-09-19 NOTE — Therapy (Signed)
[place], [place], [address] Phone: [phone]   Fax:  [phone]  Physical Therapy Evaluation  Patient Details  Name: Rebekah Henderson MRN: [id number] Date of Birth: Mar 08, 1956 Referring Provider: Curt Bears, MD  Encounter Date: 09/19/2016      PT End of Session - 09/19/16 1434    Visit Number 1   Number of Visits 1   PT Start Time 9242   PT Stop Time 1419   PT Time Calculation (min) 26 min   Activity Tolerance Patient tolerated treatment well   Behavior During Therapy [facility] for tasks assessed/performed      Past Medical History:  Diagnosis Date  . Anxiety    takes Prozac daily  . Arthritis   . Arthritis   . Cancer ([place])    skin   . Colon polyps   . COPD (chronic obstructive pulmonary disease) ([place])   . Diverticulitis   . Dyspnea    with exertion  . Fibromyalgia   . GERD (gastroesophageal reflux disease)    takes Pantoprazole daily  . Hypertension    takes Metoprolol,Triamterene-HCTZ,and Amlodipine daily  . Hypothyroidism    takes Synthroid daily  . IBS (irritable bowel syndrome)   . PONV (postoperative nausea and vomiting)    pt also states that she had some difficulty breathing after cervical fusion    Past Surgical History:  Procedure Laterality Date  . BIOPSY N/A 05/25/2013   Procedure: BIOPSIES (Random Colon; Duodenal; Gastric);  Surgeon: Danie Binder, MD;  Location: [facility];  Service: Endoscopy;  Laterality: N/A;  . BLADDER SUSPENSION    . BREAST ENHANCEMENT SURGERY    . BREAST IMPLANT REMOVAL    . CERVICAL FUSION  AUG 2013  . CHOLECYSTECTOMY  1999  . COLONOSCOPY  2007 [place]   POLYPS  . COLONOSCOPY WITH PROPOFOL N/A 05/25/2013   Procedure: COLONOSCOPY WITH PROPOFOL(at cecum 0957) total withdrawal time=34mn);  Surgeon: SDanie Binder MD;  Location: [facility];  Service: Endoscopy;  Laterality: N/A;  . ESOPHAGOGASTRODUODENOSCOPY (EGD) WITH PROPOFOL N/A 05/25/2013   Procedure: ESOPHAGOGASTRODUODENOSCOPY (EGD) WITH PROPOFOL;  Surgeon: SDanie Binder MD;  Location: [facility];  Service: Endoscopy;  Laterality: N/A;  . FOOT SURGERY    . POLYPECTOMY N/A 05/25/2013   Procedure: POLYPECTOMY (Rectal and Gastric);  Surgeon: SDanie Binder MD;  Location: [facility];  Service: Endoscopy;  Laterality: N/A;  . TONSILLECTOMY    . UPPER GASTROINTESTINAL ENDOSCOPY    . VIDEO BRONCHOSCOPY WITH ENDOBRONCHIAL ULTRASOUND  09/12/2016   Procedure: VIDEO BRONCHOSCOPY WITH ENDOBRONCHIAL ULTRASOUND AND BIOPSY;  Surgeon: DJuanito Doom MD;  [facility]  Service: Cardiopulmonary;;    There were no vitals filed for this visit.       Subjective Assessment - 09/19/16 1421    Subjective Reports some difficulty with balance and dizziness.   Patient is accompained by: --  friend   Pertinent History Patient diagnosed with 4.3 cm left main stem small cell carcinoma mass plus large node. She still needs further staging and will likely have concurrent chemoradiation if there are no other metastases. She is an ex-smoker with history of COPD.   Patient Stated Goals to obtain information from multidisciplinary clinic providers   Currently in Pain? Yes   Pain Score 6    Pain Location Head   Aggravating Factors  anxiety   Pain Relieving Factors pain meds   Multiple Pain Sites Yes   Pain Score 7   Pain  Location Chest   Aggravating Factors  eating   Pain Relieving Factors nothing            [facility] PT Assessment - 09/19/16 0001      Assessment   Medical Diagnosis small cell carcinoma   Referring Provider Curt Bears, MD     Precautions   Precaution Comments active cancer     Restrictions   Weight Bearing Restrictions No     Balance Screen   Has the patient fallen in the past 6 months Yes   How many times? 2  tripped once, other time she got dizzy and fell   Has the patient had a decrease in activity level because of a fear of falling?  No   Is the patient  reluctant to leave their home because of a fear of falling?  No     Home Environment   Living Environment Private residence   Living Arrangements Alone   Type of [place] to enter   Entrance Stairs-Number of Steps 2   [place] One level     Prior Function   Level of [place] Retired   Leisure no regular exercise     Cognition   Overall Cognitive Status Within Functional Limits for tasks assessed     Functional Tests   Functional tests Sit to Stand     Sit to Stand   Comments able to complete 9 repetitions during 30 second sit to stand, below average for her age  pt SOB after     Posture/Postural Control   Posture/Postural Control Postural limitations   Postural Limitations Rounded Shoulders;Forward head;Increased thoracic kyphosis     ROM / Strength   AROM / PROM / Strength AROM     AROM   Overall AROM Comments standing trunk AROM WFL for flexion, left sidebending, and bil rotation; 25% limited in right sidebending; 50% limited in extension     Ambulation/Gait   Ambulation/Gait Yes   Ambulation/Gait Assistance 7: Independent     Balance   Balance Assessed Yes     Dynamic Standing Balance   Dynamic Standing - Comments able to reach 6.5 inches during standing functional reach, significantly below average for her age                           PT Education - 09/19/16 1433    Education provided Yes   Education Details energy conservation, walking program, Cure article, posture, deep breathing, PT information   Person(s) Educated Patient   Methods Explanation;Demonstration;Handout   Comprehension Verbalized understanding;Returned demonstration               Lung Clinic Goals - 09/19/16 1438      Patient will be able to verbalize understanding of the benefit of exercise to decrease fatigue.   Status Achieved     Patient will be able to verbalize the importance of posture.   Status  Achieved     Patient will be able to demonstrate diaphragmatic breathing for improved lung function.   Status Achieved     Patient will be able to verbalize understanding of the role of physical therapy to prevent functional decline and who to contact if physical therapy is needed.   Status Achieved             Plan - 09/19/16 1434    Clinical Impression Statement Patient is a 60 year old  female recently diagnosed with left main stem small cell carcinoma currently awaiting further staging. She demonstrates decreased standing balance, fewer than average repetitions during 30 second sit to stand with SOB following, and decreased trunk AROM with some recent history of falls. She was instructed to inform her MD if she is interested in further PT.   Rehab Potential Good   PT Frequency One time visit   PT Treatment/Interventions ADLs/Self Care Home Management;Patient/family education   PT Next Visit Plan none at this time   Consulted and Agree with Plan of Care Patient      Patient will benefit from skilled therapeutic intervention in order to improve the following deficits and impairments:  Decreased range of motion, Decreased endurance, Postural dysfunction, Pain, Decreased balance  Visit Diagnosis: Unsteadiness on feet  Abnormal posture  Other symptoms and signs involving the musculoskeletal system     Problem List Patient Active Problem List   Diagnosis Date Noted  . Small cell lung cancer, left ([place]) 09/19/2016  . Mediastinal mass 09/10/2016  . COPD (chronic obstructive pulmonary disease) ([place])   . Hypertension   . Fibromyalgia   . IBS (irritable bowel syndrome)   . GERD (gastroesophageal reflux disease)   . Hypothyroidism   . Colon polyps   . Anxiety   . Arthritis   . PONV (postoperative nausea and vomiting)   . Cancer ([place])   . Diverticulitis   . Abdominal pain, other specified site 05/12/2013  . Diarrhea 05/12/2013    Mellody Life 09/19/2016,  2:39 PM  [place], [place], [address] Phone: [phone]   Fax:  [phone]  Name: Rebekah Henderson MRN: [id number] Date of Birth: 06-18-1956  Saverio Danker, SPT  This entire session was guided, instructed, and directly supervised by Serafina Royals, PT. Read, reviewed, edited and agree with student's findings and recommendations.   Serafina Royals, PT 09/19/16 2:49 PM

## 2016-09-20 ENCOUNTER — Encounter: Payer: Self-pay | Admitting: *Deleted

## 2016-09-20 NOTE — Progress Notes (Signed)
Oncology Nurse Navigator Documentation  Oncology Nurse Navigator Flowsheets 09/20/2016  Navigator Location [facility]  Navigator Encounter Type Clinic/MDC/I spoke with patient and family at thoracic clinic yesterday.  I gave and explained information on lung cancer, treatment, resources and support groups at cancer center.  I notified managed care to obtain authorization for PET and MRI Brain.    Abnormal Finding Date 09/02/2016  Confirmed Diagnosis Date 09/12/2016  Multidisiplinary Clinic Date 09/19/2016  Treatment Initiated Date 09/23/2016  Patient Visit Type MedOnc  Treatment Phase Pre-Tx/Tx Discussion  Barriers/Navigation Needs Coordination of Care;Education  Education Newly Diagnosed Cancer Education  Interventions Coordination of Care;Education  Coordination of Care Other  Education Method Verbal;Written  Acuity Level 2  Acuity Level 2 Educational needs;Other  Time Spent with Patient 45

## 2016-09-21 ENCOUNTER — Telehealth: Payer: Self-pay | Admitting: Internal Medicine

## 2016-09-21 NOTE — Telephone Encounter (Signed)
Appointments complete per 10/19 los. Per infusion supervisor tx started 11/1 - 11/4 as infusion area is at capacity 10/31 and availability to start 7hr infusion 10/30.   Patient was given ched class for 10/24 prior to leaving office on 10/19 and informed that central radiology would call re scans and I would need to send a message to infusion managers re a start date for tx.  Patient made aware prior to leaving office that labs/infusion appointments would be added upon receiving a start date from managers and she is to stop to see me for new schedule when she comes in for ched class 10/24.

## 2016-09-23 ENCOUNTER — Other Ambulatory Visit: Payer: Medicare Other

## 2016-09-24 ENCOUNTER — Encounter: Payer: Self-pay | Admitting: Internal Medicine

## 2016-09-25 DIAGNOSIS — C3432 Malignant neoplasm of lower lobe, left bronchus or lung: Secondary | ICD-10-CM | POA: Diagnosis not present

## 2016-09-27 ENCOUNTER — Ambulatory Visit (HOSPITAL_COMMUNITY)
Admission: RE | Admit: 2016-09-27 | Discharge: 2016-09-27 | Disposition: A | Discharge status: Home / Self Care | Payer: Medicare Other | Source: Ambulatory Visit | Attending: Internal Medicine | Admitting: Internal Medicine

## 2016-09-27 ENCOUNTER — Encounter: Payer: Self-pay | Admitting: *Deleted

## 2016-09-27 DIAGNOSIS — C3492 Malignant neoplasm of unspecified part of left bronchus or lung: Secondary | ICD-10-CM

## 2016-09-27 DIAGNOSIS — R51 Headache: Secondary | ICD-10-CM | POA: Diagnosis not present

## 2016-09-27 LAB — GLUCOSE, CAPILLARY: GLUCOSE-CAPILLARY: 88 mg/dL (ref 65–99)

## 2016-09-27 MED ORDER — FLUDEOXYGLUCOSE F - 18 (FDG) INJECTION
9.0800 | Freq: Once | INTRAVENOUS | Status: AC | PRN
  Administered 2016-09-27: 9.08 via INTRAVENOUS

## 2016-09-27 MED ORDER — GADOBENATE DIMEGLUMINE 529 MG/ML IV SOLN
18.0000 mL | Freq: Once | INTRAVENOUS | Status: AC | PRN
  Administered 2016-09-27: 18 mL via INTRAVENOUS

## 2016-09-27 NOTE — Progress Notes (Signed)
[place] Work  Clinical Social Work received voicemail from patient stating she is experiencing significant anxiety.  CSW contacted patient by phone.  The patient shared she has a history of anxiety and has difficulty managing anxiety in the past.  She reported experiencing significant anxiety that is causing difficulty sleeping and upset stomach.  Patient requested anxiety prescription from medical oncologist.  [place] notified patient medical oncologist is not in the office today; encouraged patient to contact her primary care physician first who is more familiar with patient and her history of anxiety.  Patient agreed to contact PCP and CSW will notify medical oncologist.  CSW briefly discussed other modalities in addition to medication to cope with anxiety such as deep breathing, guided imagery, journaling, and counseling.  CSW will make referral to [facility] counseling intern and will follow up with patient throughout treatment.    Polo Riley, MSW, LCSW, OSW-C Clinical Social Worker [facility] [phone]

## 2016-10-01 DIAGNOSIS — R0902 Hypoxemia: Secondary | ICD-10-CM | POA: Diagnosis not present

## 2016-10-01 DIAGNOSIS — J449 Chronic obstructive pulmonary disease, unspecified: Secondary | ICD-10-CM | POA: Diagnosis not present

## 2016-10-02 ENCOUNTER — Other Ambulatory Visit (HOSPITAL_BASED_OUTPATIENT_CLINIC_OR_DEPARTMENT_OTHER): Payer: Medicare Other

## 2016-10-02 ENCOUNTER — Ambulatory Visit (HOSPITAL_BASED_OUTPATIENT_CLINIC_OR_DEPARTMENT_OTHER): Payer: Medicare Other

## 2016-10-02 VITALS — BP 144/69 | HR 69 | Temp 98.4°F | Resp 17

## 2016-10-02 DIAGNOSIS — C3492 Malignant neoplasm of unspecified part of left bronchus or lung: Secondary | ICD-10-CM

## 2016-10-02 DIAGNOSIS — Z510 Encounter for antineoplastic radiation therapy: Secondary | ICD-10-CM | POA: Diagnosis not present

## 2016-10-02 DIAGNOSIS — Z5111 Encounter for antineoplastic chemotherapy: Secondary | ICD-10-CM | POA: Diagnosis not present

## 2016-10-02 DIAGNOSIS — C3432 Malignant neoplasm of lower lobe, left bronchus or lung: Secondary | ICD-10-CM | POA: Diagnosis not present

## 2016-10-02 LAB — COMPREHENSIVE METABOLIC PANEL WITH GFR
ALT: 11 U/L (ref 0–55)
AST: 18 U/L (ref 5–34)
Albumin: 3.4 g/dL — ABNORMAL LOW (ref 3.5–5.0)
Alkaline Phosphatase: 50 U/L (ref 40–150)
Anion Gap: 9 meq/L (ref 3–11)
BUN: 9.4 mg/dL (ref 7.0–26.0)
CO2: 28 meq/L (ref 22–29)
Calcium: 8.7 mg/dL (ref 8.4–10.4)
Chloride: 102 meq/L (ref 98–109)
Creatinine: 0.7 mg/dL (ref 0.6–1.1)
EGFR: >90 ml/min/1.73 m2
Glucose: 95 mg/dL (ref 70–140)
Potassium: 3.6 meq/L (ref 3.5–5.1)
Sodium: 139 meq/L (ref 136–145)
Total Bilirubin: 0.45 mg/dL (ref 0.20–1.20)
Total Protein: 6.7 g/dL (ref 6.4–8.3)

## 2016-10-02 LAB — CBC WITH DIFFERENTIAL/PLATELET
BASO%: 0.2 % (ref 0.0–2.0)
BASOS ABS: 0 10*3/uL (ref 0.0–0.1)
EOS%: 3.7 % (ref 0.0–7.0)
Eosinophils Absolute: 0.2 10*3/uL (ref 0.0–0.5)
HCT: 39.6 % (ref 34.8–46.6)
HGB: 13.6 g/dL (ref 11.6–15.9)
LYMPH%: 28.8 % (ref 14.0–49.7)
MCH: 29.4 pg (ref 25.1–34.0)
MCHC: 34.3 g/dL (ref 31.5–36.0)
MCV: 85.5 fL (ref 79.5–101.0)
MONO#: 0.6 10*3/uL (ref 0.1–0.9)
MONO%: 8.7 % (ref 0.0–14.0)
NEUT#: 3.8 10*3/uL (ref 1.5–6.5)
NEUT%: 58.6 % (ref 38.4–76.8)
NRBC: 0 % (ref 0–0)
Platelets: 300 10*3/uL (ref 145–400)
RBC: 4.63 10*6/uL (ref 3.70–5.45)
RDW: 14.1 % (ref 11.2–14.5)
WBC: 6.5 10*3/uL (ref 3.9–10.3)
lymph#: 1.9 10*3/uL (ref 0.9–3.3)

## 2016-10-02 LAB — MAGNESIUM: Magnesium: 1.8 mg/dl (ref 1.5–2.5)

## 2016-10-02 MED ORDER — SODIUM CHLORIDE 0.9% FLUSH
10.0000 mL | INTRAVENOUS | Status: DC | PRN
  Filled 2016-10-02: qty 10

## 2016-10-02 MED ORDER — HEPARIN SOD (PORK) LOCK FLUSH 100 UNIT/ML IV SOLN
500.0000 [IU] | Freq: Once | INTRAVENOUS | Status: DC | PRN
  Filled 2016-10-02: qty 5

## 2016-10-02 MED ORDER — SODIUM CHLORIDE 0.9 % IV SOLN
Freq: Once | INTRAVENOUS | Status: AC
  Administered 2016-10-02: 12:00:00 via INTRAVENOUS
  Filled 2016-10-02: qty 5

## 2016-10-02 MED ORDER — SODIUM CHLORIDE 0.9 % IV SOLN
Freq: Once | INTRAVENOUS | Status: AC
  Administered 2016-10-02: 10:00:00 via INTRAVENOUS

## 2016-10-02 MED ORDER — POTASSIUM CHLORIDE 2 MEQ/ML IV SOLN
Freq: Once | INTRAVENOUS | Status: AC
  Administered 2016-10-02: 10:00:00 via INTRAVENOUS
  Filled 2016-10-02: qty 10

## 2016-10-02 MED ORDER — SODIUM CHLORIDE 0.9 % IV SOLN
120.0000 mg/m2 | Freq: Once | INTRAVENOUS | Status: AC
  Administered 2016-10-02: 240 mg via INTRAVENOUS
  Filled 2016-10-02: qty 12

## 2016-10-02 MED ORDER — PALONOSETRON HCL INJECTION 0.25 MG/5ML
0.2500 mg | Freq: Once | INTRAVENOUS | Status: AC
  Administered 2016-10-02: 0.25 mg via INTRAVENOUS

## 2016-10-02 MED ORDER — SODIUM CHLORIDE 0.9 % IV SOLN
60.0000 mg/m2 | Freq: Once | INTRAVENOUS | Status: AC
  Administered 2016-10-02: 119 mg via INTRAVENOUS
  Filled 2016-10-02: qty 119

## 2016-10-02 MED ORDER — PALONOSETRON HCL INJECTION 0.25 MG/5ML
INTRAVENOUS | Status: AC
  Filled 2016-10-02: qty 5

## 2016-10-02 NOTE — Patient Instructions (Addendum)
[facility] Discharge Instructions for Patients Receiving Chemotherapy  Today you received the following chemotherapy agents:  Cisplatin, Etoposide  To help prevent nausea and vomiting after your treatment, we encourage you to take your nausea medication as prescribed.   If you develop nausea and vomiting that is not controlled by your nausea medication, call the clinic.   BELOW ARE SYMPTOMS THAT SHOULD BE REPORTED IMMEDIATELY:  *FEVER GREATER THAN 100.5 F  *CHILLS WITH OR WITHOUT FEVER  NAUSEA AND VOMITING THAT IS NOT CONTROLLED WITH YOUR NAUSEA MEDICATION  *UNUSUAL SHORTNESS OF BREATH  *UNUSUAL BRUISING OR BLEEDING  TENDERNESS IN MOUTH AND THROAT WITH OR WITHOUT PRESENCE OF ULCERS  *URINARY PROBLEMS  *BOWEL PROBLEMS  UNUSUAL RASH Items with * indicate a potential emergency and should be followed up as soon as possible.  Feel free to call the clinic you have any questions or concerns. The clinic phone number is [phone].  Please show the [place] at check-in to the Emergency Department and triage nurse.    Etoposide, VP-16 capsules What is this medicine? ETOPOSIDE, VP-16 (e toe POE side) is a chemotherapy drug. It is used to treat small cell lung cancer and other cancers. This medicine may be used for other purposes; ask your health care provider or pharmacist if you have questions. What should I tell my health care provider before I take this medicine? They need to know if you have any of these conditions: -infection -kidney disease -low blood counts, like low white cell, platelet, or red cell counts -an unusual or allergic reaction to etoposide, other chemotherapeutic agents, other medicines, foods, dyes, or preservatives -pregnant or trying to get pregnant -breast-feeding How should I use this medicine? Take this medicine by mouth with a glass of water. Follow the directions on the prescription label. Do not open, crush, or chew the  capsules. It is advisable to wear gloves when handling this medicine. Take your medicine at regular intervals. Do not take it more often than directed. Do not stop taking except on your doctor's advice. Talk to your pediatrician regarding the use of this medicine in children. Special care may be needed. Overdosage: If you think you have taken too much of this medicine contact a poison control center or emergency room at once. NOTE: This medicine is only for you. Do not share this medicine with others. What if I miss a dose? If you miss a dose, take it as soon as you can. If it is almost time for your next dose, take only that dose. Do not take double or extra doses. What may interact with this medicine? -aspirin -certain medications for seizures like carbamazepine, phenobarbital, phenytoin, valproic acid -cyclosporine -levamisole -valproic acid -warfarin This list may not describe all possible interactions. Give your health care provider a list of all the medicines, herbs, non-prescription drugs, or dietary supplements you use. Also tell them if you smoke, drink alcohol, or use illegal drugs. Some items may interact with your medicine. What should I watch for while using this medicine? Visit your doctor for checks on your progress. This drug may make you feel generally unwell. This is not uncommon, as chemotherapy can affect healthy cells as well as cancer cells. Report any side effects. Continue your course of treatment even though you feel ill unless your doctor tells you to stop. In some cases, you may be given additional medicines to help with side effects. Follow all directions for their use. Call your doctor or health  care professional for advice if you get a fever, chills or sore throat, or other symptoms of a cold or flu. Do not treat yourself. This drug decreases your body's ability to fight infections. Try to avoid being around people who are sick. This medicine may increase your risk to  bruise or bleed. Call your doctor or health care professional if you notice any unusual bleeding. Be careful brushing and flossing your teeth or using a toothpick because you may get an infection or bleed more easily. If you have any dental work done, tell your dentist you are receiving this medicine. Avoid taking products that contain aspirin, acetaminophen, ibuprofen, naproxen, or ketoprofen unless instructed by your doctor. These medicines may hide a fever. Do not become pregnant while taking this medicine or for at least 6 months after stopping it. Women should inform their doctor if they wish to become pregnant or think they might be pregnant. Women of child-bearing potential will need to have a negative pregnancy test before starting this medicine. There is a potential for serious side effects to an unborn child. Talk to your health care professional or pharmacist for more information. Do not breast-feed an infant while taking this medicine. Men must use a latex condom during sexual contact with a woman while taking this medicine and for at least 4 months after stopping it. A latex condom is needed even if you have had a vasectomy. Contact your doctor right away if your partner becomes pregnant. Do not donate sperm while taking this medicine and for 4 months after you stop taking this medicine. Men should inform their doctors if they wish to father a child. This medicine may lower sperm counts. What side effects may I notice from receiving this medicine? Side effects that you should report to your doctor or health care professional as soon as possible: -allergic reactions like skin rash, itching or hives, swelling of the face, lips, or tongue -low blood counts - this medicine may decrease the number of white blood cells, red blood cells and platelets. You may be at increased risk for infections and bleeding. -signs of infection - fever or chills, cough, sore throat, pain or difficulty passing  urine -signs of decreased platelets or bleeding - bruising, pinpoint red spots on the skin, black, tarry stools, blood in the urine -signs of decreased red blood cells - unusually weak or tired, fainting spells, lightheadedness -breathing problems -changes in vision -mouth or throat sores or ulcers -pain, tingling, numbness in the hands or feet -redness, blistering, peeling or loosening of the skin, including inside the mouth -seizures -vomiting Side effects that usually do not require medical attention (report to your doctor or health care professional if they continue or are bothersome): -change in taste -diarrhea -hair loss -nausea -stomach pain This list may not describe all possible side effects. Call your doctor for medical advice about side effects. You may report side effects to FDA at 1-800-FDA-1088. Where should I keep my medicine? Keep out of the reach of children. Store in a refrigerator between 2 and 8 degrees C (36 and 46 degrees F). Do not freeze. Throw away any unused medicine after the expiration date. NOTE: This sheet is a summary. It may not cover all possible information. If you have questions about this medicine, talk to your doctor, pharmacist, or health care provider.    2016, Elsevier/Gold Standard. (2014-07-14 12:28:54)   Cisplatin injection What is this medicine? CISPLATIN (SIS pla tin) is a chemotherapy drug. It targets fast  dividing cells, like cancer cells, and causes these cells to die. This medicine is used to treat many types of cancer like bladder, ovarian, and testicular cancers. This medicine may be used for other purposes; ask your health care provider or pharmacist if you have questions. What should I tell my health care provider before I take this medicine? They need to know if you have any of these conditions: -blood disorders -hearing problems -kidney disease -recent or ongoing radiation therapy -an unusual or allergic reaction to cisplatin,  carboplatin, other chemotherapy, other medicines, foods, dyes, or preservatives -pregnant or trying to get pregnant -breast-feeding How should I use this medicine? This drug is given as an infusion into a vein. It is administered in a hospital or clinic by a specially trained health care professional. Talk to your pediatrician regarding the use of this medicine in children. Special care may be needed. Overdosage: If you think you have taken too much of this medicine contact a poison control center or emergency room at once. NOTE: This medicine is only for you. Do not share this medicine with others. What if I miss a dose? It is important not to miss a dose. Call your doctor or health care professional if you are unable to keep an appointment. What may interact with this medicine? -dofetilide -foscarnet -medicines for seizures -medicines to increase blood counts like filgrastim, pegfilgrastim, sargramostim -probenecid -pyridoxine used with altretamine -rituximab -some antibiotics like amikacin, gentamicin, neomycin, polymyxin B, streptomycin, tobramycin -sulfinpyrazone -vaccines -zalcitabine Talk to your doctor or health care professional before taking any of these medicines: -acetaminophen -aspirin -ibuprofen -ketoprofen -naproxen This list may not describe all possible interactions. Give your health care provider a list of all the medicines, herbs, non-prescription drugs, or dietary supplements you use. Also tell them if you smoke, drink alcohol, or use illegal drugs. Some items may interact with your medicine. What should I watch for while using this medicine? Your condition will be monitored carefully while you are receiving this medicine. You will need important blood work done while you are taking this medicine. This drug may make you feel generally unwell. This is not uncommon, as chemotherapy can affect healthy cells as well as cancer cells. Report any side effects. Continue  your course of treatment even though you feel ill unless your doctor tells you to stop. In some cases, you may be given additional medicines to help with side effects. Follow all directions for their use. Call your doctor or health care professional for advice if you get a fever, chills or sore throat, or other symptoms of a cold or flu. Do not treat yourself. This drug decreases your body's ability to fight infections. Try to avoid being around people who are sick. This medicine may increase your risk to bruise or bleed. Call your doctor or health care professional if you notice any unusual bleeding. Be careful brushing and flossing your teeth or using a toothpick because you may get an infection or bleed more easily. If you have any dental work done, tell your dentist you are receiving this medicine. Avoid taking products that contain aspirin, acetaminophen, ibuprofen, naproxen, or ketoprofen unless instructed by your doctor. These medicines may hide a fever. Do not become pregnant while taking this medicine. Women should inform their doctor if they wish to become pregnant or think they might be pregnant. There is a potential for serious side effects to an unborn child. Talk to your health care professional or pharmacist for more information. Do not  breast-feed an infant while taking this medicine. Drink fluids as directed while you are taking this medicine. This will help protect your kidneys. Call your doctor or health care professional if you get diarrhea. Do not treat yourself. What side effects may I notice from receiving this medicine? Side effects that you should report to your doctor or health care professional as soon as possible: -allergic reactions like skin rash, itching or hives, swelling of the face, lips, or tongue -signs of infection - fever or chills, cough, sore throat, pain or difficulty passing urine -signs of decreased platelets or bleeding - bruising, pinpoint red spots on the  skin, black, tarry stools, nosebleeds -signs of decreased red blood cells - unusually weak or tired, fainting spells, lightheadedness -breathing problems -changes in hearing -gout pain -low blood counts - This drug may decrease the number of white blood cells, red blood cells and platelets. You may be at increased risk for infections and bleeding. -nausea and vomiting -pain, swelling, redness or irritation at the injection site -pain, tingling, numbness in the hands or feet -problems with balance, movement -trouble passing urine or change in the amount of urine Side effects that usually do not require medical attention (report to your doctor or health care professional if they continue or are bothersome): -changes in vision -loss of appetite -metallic taste in the mouth or changes in taste This list may not describe all possible side effects. Call your doctor for medical advice about side effects. You may report side effects to FDA at 1-800-FDA-1088. Where should I keep my medicine? This drug is given in a hospital or clinic and will not be stored at home. NOTE: This sheet is a summary. It may not cover all possible information. If you have questions about this medicine, talk to your doctor, pharmacist, or health care provider.    2016, Elsevier/Gold Standard. (2008-02-23 14:40:54)

## 2016-10-03 ENCOUNTER — Ambulatory Visit (HOSPITAL_BASED_OUTPATIENT_CLINIC_OR_DEPARTMENT_OTHER): Payer: Medicare Other

## 2016-10-03 VITALS — BP 124/80 | HR 70 | Temp 97.9°F | Resp 16

## 2016-10-03 DIAGNOSIS — Z5111 Encounter for antineoplastic chemotherapy: Secondary | ICD-10-CM | POA: Diagnosis not present

## 2016-10-03 DIAGNOSIS — C3492 Malignant neoplasm of unspecified part of left bronchus or lung: Secondary | ICD-10-CM

## 2016-10-03 DIAGNOSIS — C3432 Malignant neoplasm of lower lobe, left bronchus or lung: Secondary | ICD-10-CM

## 2016-10-03 MED ORDER — SODIUM CHLORIDE 0.9 % IV SOLN
120.0000 mg/m2 | Freq: Once | INTRAVENOUS | Status: AC
  Administered 2016-10-03: 240 mg via INTRAVENOUS
  Filled 2016-10-03: qty 12

## 2016-10-03 MED ORDER — DEXAMETHASONE SODIUM PHOSPHATE 10 MG/ML IJ SOLN
10.0000 mg | Freq: Once | INTRAMUSCULAR | Status: AC
  Administered 2016-10-03: 10 mg via INTRAVENOUS

## 2016-10-03 MED ORDER — SODIUM CHLORIDE 0.9 % IV SOLN
Freq: Once | INTRAVENOUS | Status: AC
  Administered 2016-10-03: 14:00:00 via INTRAVENOUS

## 2016-10-03 MED ORDER — DEXAMETHASONE SODIUM PHOSPHATE 10 MG/ML IJ SOLN
INTRAMUSCULAR | Status: AC
  Filled 2016-10-03: qty 1

## 2016-10-03 NOTE — Patient Instructions (Signed)
[facility] Discharge Instructions for Patients Receiving Chemotherapy  Today you received the following chemotherapy agents:  VP16  To help prevent nausea and vomiting after your treatment, we encourage you to take your nausea medication as directed.   If you develop nausea and vomiting that is not controlled by your nausea medication, call the clinic.   BELOW ARE SYMPTOMS THAT SHOULD BE REPORTED IMMEDIATELY:  *FEVER GREATER THAN 100.5 F  *CHILLS WITH OR WITHOUT FEVER  NAUSEA AND VOMITING THAT IS NOT CONTROLLED WITH YOUR NAUSEA MEDICATION  *UNUSUAL SHORTNESS OF BREATH  *UNUSUAL BRUISING OR BLEEDING  TENDERNESS IN MOUTH AND THROAT WITH OR WITHOUT PRESENCE OF ULCERS  *URINARY PROBLEMS  *BOWEL PROBLEMS  UNUSUAL RASH Items with * indicate a potential emergency and should be followed up as soon as possible.  Feel free to call the clinic you have any questions or concerns. The clinic phone number is [phone].  Please show the [place] at check-in to the Emergency Department and triage nurse.

## 2016-10-04 ENCOUNTER — Telehealth: Payer: Self-pay | Admitting: *Deleted

## 2016-10-04 ENCOUNTER — Ambulatory Visit (HOSPITAL_BASED_OUTPATIENT_CLINIC_OR_DEPARTMENT_OTHER): Payer: Medicare Other

## 2016-10-04 VITALS — BP 156/74 | HR 66 | Temp 97.7°F | Resp 18

## 2016-10-04 DIAGNOSIS — C3492 Malignant neoplasm of unspecified part of left bronchus or lung: Secondary | ICD-10-CM

## 2016-10-04 DIAGNOSIS — Z5111 Encounter for antineoplastic chemotherapy: Secondary | ICD-10-CM | POA: Diagnosis not present

## 2016-10-04 DIAGNOSIS — C3432 Malignant neoplasm of lower lobe, left bronchus or lung: Secondary | ICD-10-CM | POA: Diagnosis not present

## 2016-10-04 MED ORDER — DEXAMETHASONE SODIUM PHOSPHATE 10 MG/ML IJ SOLN
10.0000 mg | Freq: Once | INTRAMUSCULAR | Status: AC
  Administered 2016-10-04: 10 mg via INTRAVENOUS

## 2016-10-04 MED ORDER — DEXAMETHASONE SODIUM PHOSPHATE 10 MG/ML IJ SOLN
INTRAMUSCULAR | Status: AC
  Filled 2016-10-04: qty 1

## 2016-10-04 MED ORDER — SODIUM CHLORIDE 0.9 % IV SOLN
Freq: Once | INTRAVENOUS | Status: AC
  Administered 2016-10-04: 08:00:00 via INTRAVENOUS

## 2016-10-04 MED ORDER — SODIUM CHLORIDE 0.9 % IV SOLN
120.0000 mg/m2 | Freq: Once | INTRAVENOUS | Status: AC
  Administered 2016-10-04: 240 mg via INTRAVENOUS
  Filled 2016-10-04: qty 12

## 2016-10-04 NOTE — Telephone Encounter (Signed)
-----   Message from Paulla Dolly, RN sent at 10/02/2016  4:47 PM EDT ----- Regarding: first chemo Dr Carollee Herter: [phone] First chemo  VP16, cisplatin.  Dr Worthy Flank pt. Home [phone] Cell [phone]

## 2016-10-04 NOTE — Patient Instructions (Signed)
[facility] Discharge Instructions for Patients Receiving Chemotherapy  Today you received the following chemotherapy agents: Etoposide   To help prevent nausea and vomiting after your treatment, we encourage you to take your nausea medication as directed.    If you develop nausea and vomiting that is not controlled by your nausea medication, call the clinic.   BELOW ARE SYMPTOMS THAT SHOULD BE REPORTED IMMEDIATELY:  *FEVER GREATER THAN 100.5 F  *CHILLS WITH OR WITHOUT FEVER  NAUSEA AND VOMITING THAT IS NOT CONTROLLED WITH YOUR NAUSEA MEDICATION  *UNUSUAL SHORTNESS OF BREATH  *UNUSUAL BRUISING OR BLEEDING  TENDERNESS IN MOUTH AND THROAT WITH OR WITHOUT PRESENCE OF ULCERS  *URINARY PROBLEMS  *BOWEL PROBLEMS  UNUSUAL RASH Items with * indicate a potential emergency and should be followed up as soon as possible.  Feel free to call the clinic you have any questions or concerns. The clinic phone number is [phone].  Please show the CHEMO ALERT CARD at check-in to the Emergency Department and triage nurse.   

## 2016-10-04 NOTE — Telephone Encounter (Signed)
Follow up call placed, unable to reach pt. LMOVM to call clinic with any concerns.

## 2016-10-05 ENCOUNTER — Ambulatory Visit: Payer: Medicare Other

## 2016-10-07 ENCOUNTER — Ambulatory Visit: Payer: Medicare Other | Admitting: Nurse Practitioner

## 2016-10-07 ENCOUNTER — Telehealth: Payer: Self-pay | Admitting: *Deleted

## 2016-10-07 NOTE — Telephone Encounter (Signed)
Will add Zofran 8 mg po tid PRN.

## 2016-10-07 NOTE — Telephone Encounter (Signed)
Pt called with concern regarding nausea on day 4 after chemotherapy. Pt received Cisplatin/VP-16, Aloxi and Emend Day 1  VP-16 day 2 & 3. Pt advised she took compazine q 6hrs day 4 with little to no improvement, day 5 vomitted up " some Bile" will review with MD and notify pt with additional information.

## 2016-10-07 NOTE — Progress Notes (Signed)
Counseling intern called patient in response to Social Work referral. Patient indicated that she was feeling nauseous today and had called in for medication to help with her symptoms. She stated that she is adjusting to her diagnosis and is managing her anxiety by taking Prozac daily. She described her faith and the comfort of her pets are her main sources of support.  Counselor affirmed patient's progress and offered counseling as an additional source of support if she needs it. Patient expressed appreciation for the offer and took down counselor's phone number, stating she will call if she needs support.  Lamount Cohen, Counseling Intern Department for [facility] and [facility] - [address] [place], [place].

## 2016-10-08 MED ORDER — ONDANSETRON HCL 8 MG PO TABS: 8.0000 mg | ORAL_TABLET | Freq: Three times a day (TID) | ORAL | 0 refills | Status: DC | PRN

## 2016-10-09 ENCOUNTER — Telehealth: Payer: Self-pay | Admitting: Internal Medicine

## 2016-10-09 ENCOUNTER — Other Ambulatory Visit: Payer: Self-pay | Admitting: Internal Medicine

## 2016-10-09 ENCOUNTER — Ambulatory Visit (HOSPITAL_BASED_OUTPATIENT_CLINIC_OR_DEPARTMENT_OTHER): Payer: Medicare Other | Admitting: Nurse Practitioner

## 2016-10-09 ENCOUNTER — Other Ambulatory Visit (HOSPITAL_BASED_OUTPATIENT_CLINIC_OR_DEPARTMENT_OTHER): Payer: Medicare Other

## 2016-10-09 VITALS — BP 153/79 | HR 70 | Temp 97.7°F | Resp 17 | Ht 64.0 in | Wt 187.1 lb

## 2016-10-09 DIAGNOSIS — C3492 Malignant neoplasm of unspecified part of left bronchus or lung: Secondary | ICD-10-CM

## 2016-10-09 DIAGNOSIS — M19049 Primary osteoarthritis, unspecified hand: Secondary | ICD-10-CM

## 2016-10-09 DIAGNOSIS — R112 Nausea with vomiting, unspecified: Secondary | ICD-10-CM

## 2016-10-09 DIAGNOSIS — R197 Diarrhea, unspecified: Secondary | ICD-10-CM | POA: Diagnosis not present

## 2016-10-09 DIAGNOSIS — C3432 Malignant neoplasm of lower lobe, left bronchus or lung: Secondary | ICD-10-CM

## 2016-10-09 LAB — COMPREHENSIVE METABOLIC PANEL
ALT: 20 U/L (ref 0–55)
AST: 19 U/L (ref 5–34)
Albumin: 3.5 g/dL (ref 3.5–5.0)
Alkaline Phosphatase: 51 U/L (ref 40–150)
Anion Gap: 11 mEq/L (ref 3–11)
BILIRUBIN TOTAL: 0.74 mg/dL (ref 0.20–1.20)
BUN: 11.7 mg/dL (ref 7.0–26.0)
CHLORIDE: 102 meq/L (ref 98–109)
CO2: 27 meq/L (ref 22–29)
CREATININE: 0.7 mg/dL (ref 0.6–1.1)
Calcium: 9.5 mg/dL (ref 8.4–10.4)
EGFR: 88 mL/min/{1.73_m2} — ABNORMAL LOW (ref >90)
GLUCOSE: 90 mg/dL (ref 70–140)
Potassium: 3.8 mEq/L (ref 3.5–5.1)
SODIUM: 140 meq/L (ref 136–145)
TOTAL PROTEIN: 7.1 g/dL (ref 6.4–8.3)

## 2016-10-09 LAB — MAGNESIUM: Magnesium: 1.4 mg/dl — CL (ref 1.5–2.5)

## 2016-10-09 LAB — CBC WITH DIFFERENTIAL/PLATELET
BASO%: 0.3 % (ref 0.0–2.0)
Basophils Absolute: 0 10*3/uL (ref 0.0–0.1)
EOS%: 2.9 % (ref 0.0–7.0)
Eosinophils Absolute: 0.1 10*3/uL (ref 0.0–0.5)
HCT: 43.8 % (ref 34.8–46.6)
HGB: 14.5 g/dL (ref 11.6–15.9)
LYMPH%: 35.5 % (ref 14.0–49.7)
MCH: 28.4 pg (ref 25.1–34.0)
MCHC: 33 g/dL (ref 31.5–36.0)
MCV: 86.2 fL (ref 79.5–101.0)
MONO#: 0 10*3/uL — AB (ref 0.1–0.9)
MONO%: 0.5 % (ref 0.0–14.0)
NEUT%: 60.8 % (ref 38.4–76.8)
NEUTROS ABS: 2.8 10*3/uL (ref 1.5–6.5)
Platelets: 224 10*3/uL (ref 145–400)
RBC: 5.08 10*6/uL (ref 3.70–5.45)
RDW: 14.1 % (ref 11.2–14.5)
WBC: 4.6 10*3/uL (ref 3.9–10.3)
lymph#: 1.6 10*3/uL (ref 0.9–3.3)

## 2016-10-09 LAB — URIC ACID: Uric Acid, Serum: 4 mg/dl (ref 2.6–7.4)

## 2016-10-09 NOTE — Progress Notes (Signed)
  [facility] OFFICE PROGRESS NOTE   Diagnosis:  Small cell lung cancer  INTERVAL HISTORY:   Ms. Jutras returns as scheduled. She completed cycle 1 cisplatin/etoposide beginning 10/02/2016. She had nausea/vomiting for several days. Compazine was partially effective. A prescription for Zofran was called to her pharmacy. The nausea resolved prior to beginning Zofran. No mouth sores. She has intermittent diarrhea and provides a history of "IBS". She takes Imodium as needed. Breathing is better. She has a slight cough. No fever. Yesterday she noted multiple hand joints were swollen and painful bilaterally.  Objective:  Vital signs in last 24 hours:  Blood pressure (!) 153/79, pulse 70, temperature 97.7 F (36.5 C), temperature source Oral, resp. rate 17, height '5\' 4"'$  (1.626 m), weight 187 lb 1.6 oz (84.9 kg), SpO2 95 %.    HEENT: No thrush or ulcers. Resp: Lungs clear bilaterally. Cardio: Regular rate and rhythm. GI: Abdomen soft and nontender. No hepatomegaly. Vascular: No leg edema. Musculoskeletal: Multiple MCP finger joints appear edematous and erythematous, right greater than left. Lateral left wrist also mildly edematous and erythematous.   Lab Results:  Lab Results  Component Value Date   WBC 4.6 10/09/2016   HGB 14.5 10/09/2016   HCT 43.8 10/09/2016   MCV 86.2 10/09/2016   PLT 224 10/09/2016   NEUTROABS 2.8 10/09/2016    Imaging:  No results found.  Medications: I have reviewed the patient's current medications.  Assessment/Plan: 1. Small cell lung cancer status post cycle 1 cisplatin/etoposide 10/02/2016. 2. Delayed nausea following cycle 1 cisplatin/etoposide. 3. Multiple inflamed appearing MCP finger joints.   Disposition: Ms. Purkey appears stable. She completed cycle 1 cisplatin/etoposide 10/02/2016. She experienced delayed nausea. The nausea resolved before trying Zofran. She will try Zofran following cycle 2 if needed. She understands to wait 72  hours due to receiving Aloxi day 1.  She has multiple inflamed appearing MCP finger joints. We are adding uric acid to today's labs.  She will return for a follow-up visit and cycle 2 cisplatin/etoposide on 10/21/2016. She will contact the office in the interim with any problems.  Plan reviewed with Dr. Julien Nordmann.    Ned Card ANP/GNP-BC   10/09/2016  3:00 PM

## 2016-10-09 NOTE — Telephone Encounter (Signed)
Per 10/09/16 los, weekly Lab only are to be cancelled from [facility] and to  [facility] at [facility]. Spoke with desk nurse, Diane per Lab Orders need to be faxed to [facility]. AVS report and appointment schedule given to patient per 10/09/16 los.

## 2016-10-10 ENCOUNTER — Telehealth: Payer: Self-pay

## 2016-10-10 NOTE — Telephone Encounter (Signed)
-----   Message from Rebekah Shark, NP sent at 10/10/2016  8:58 AM EST ----- Please let her know the uric acid level was normal. Treat joint pain symptomatically for now.

## 2016-10-10 NOTE — Telephone Encounter (Signed)
Called and informed pt of normal uric acid levels and well treat joint pain symptomatically, pt verbalized understanding and denies any questions or concerns at this time.

## 2016-10-14 ENCOUNTER — Other Ambulatory Visit: Payer: Self-pay | Admitting: Medical Oncology

## 2016-10-14 ENCOUNTER — Institutional Professional Consult (permissible substitution): Payer: Medicare Other | Admitting: Pulmonary Disease

## 2016-10-14 MED ORDER — MAGNESIUM OXIDE 400 (241.3 MG) MG PO TABS: 400.0000 mg | ORAL_TABLET | Freq: Three times a day (TID) | ORAL | 0 refills | Status: DC

## 2016-10-15 ENCOUNTER — Other Ambulatory Visit: Payer: Medicare Other

## 2016-10-15 ENCOUNTER — Inpatient Hospital Stay (HOSPITAL_COMMUNITY)
Admission: EM | Admit: 2016-10-15 | Discharge: 2016-10-17 | DRG: 809 | Disposition: A | Discharge status: Home / Self Care | Payer: Medicare Other | Attending: Internal Medicine | Admitting: Internal Medicine

## 2016-10-15 ENCOUNTER — Telehealth: Payer: Self-pay | Admitting: *Deleted

## 2016-10-15 ENCOUNTER — Emergency Department (HOSPITAL_COMMUNITY): Payer: Medicare Other

## 2016-10-15 ENCOUNTER — Other Ambulatory Visit: Payer: Self-pay | Admitting: Internal Medicine

## 2016-10-15 ENCOUNTER — Encounter (HOSPITAL_COMMUNITY): Payer: Self-pay

## 2016-10-15 DIAGNOSIS — M797 Fibromyalgia: Secondary | ICD-10-CM | POA: Diagnosis present

## 2016-10-15 DIAGNOSIS — A084 Viral intestinal infection, unspecified: Secondary | ICD-10-CM | POA: Diagnosis present

## 2016-10-15 DIAGNOSIS — I10 Essential (primary) hypertension: Secondary | ICD-10-CM | POA: Diagnosis not present

## 2016-10-15 DIAGNOSIS — Z9981 Dependence on supplemental oxygen: Secondary | ICD-10-CM | POA: Diagnosis not present

## 2016-10-15 DIAGNOSIS — R21 Rash and other nonspecific skin eruption: Secondary | ICD-10-CM | POA: Diagnosis present

## 2016-10-15 DIAGNOSIS — F419 Anxiety disorder, unspecified: Secondary | ICD-10-CM | POA: Diagnosis present

## 2016-10-15 DIAGNOSIS — Z79899 Other long term (current) drug therapy: Secondary | ICD-10-CM | POA: Diagnosis not present

## 2016-10-15 DIAGNOSIS — J9611 Chronic respiratory failure with hypoxia: Secondary | ICD-10-CM | POA: Diagnosis not present

## 2016-10-15 DIAGNOSIS — R112 Nausea with vomiting, unspecified: Secondary | ICD-10-CM | POA: Diagnosis present

## 2016-10-15 DIAGNOSIS — R5081 Fever presenting with conditions classified elsewhere: Secondary | ICD-10-CM

## 2016-10-15 DIAGNOSIS — Z7982 Long term (current) use of aspirin: Secondary | ICD-10-CM | POA: Diagnosis not present

## 2016-10-15 DIAGNOSIS — R110 Nausea: Secondary | ICD-10-CM | POA: Diagnosis not present

## 2016-10-15 DIAGNOSIS — D701 Agranulocytosis secondary to cancer chemotherapy: Principal | ICD-10-CM | POA: Diagnosis present

## 2016-10-15 DIAGNOSIS — E039 Hypothyroidism, unspecified: Secondary | ICD-10-CM | POA: Diagnosis not present

## 2016-10-15 DIAGNOSIS — C3492 Malignant neoplasm of unspecified part of left bronchus or lung: Secondary | ICD-10-CM | POA: Diagnosis present

## 2016-10-15 DIAGNOSIS — D696 Thrombocytopenia, unspecified: Secondary | ICD-10-CM

## 2016-10-15 DIAGNOSIS — Z79890 Hormone replacement therapy (postmenopausal): Secondary | ICD-10-CM | POA: Diagnosis not present

## 2016-10-15 DIAGNOSIS — Z981 Arthrodesis status: Secondary | ICD-10-CM

## 2016-10-15 DIAGNOSIS — E876 Hypokalemia: Secondary | ICD-10-CM | POA: Diagnosis not present

## 2016-10-15 DIAGNOSIS — T451X5A Adverse effect of antineoplastic and immunosuppressive drugs, initial encounter: Secondary | ICD-10-CM | POA: Diagnosis not present

## 2016-10-15 DIAGNOSIS — R509 Fever, unspecified: Secondary | ICD-10-CM | POA: Diagnosis not present

## 2016-10-15 DIAGNOSIS — D709 Neutropenia, unspecified: Secondary | ICD-10-CM | POA: Diagnosis present

## 2016-10-15 DIAGNOSIS — D61810 Antineoplastic chemotherapy induced pancytopenia: Secondary | ICD-10-CM

## 2016-10-15 DIAGNOSIS — J449 Chronic obstructive pulmonary disease, unspecified: Secondary | ICD-10-CM | POA: Diagnosis present

## 2016-10-15 DIAGNOSIS — R197 Diarrhea, unspecified: Secondary | ICD-10-CM | POA: Diagnosis present

## 2016-10-15 DIAGNOSIS — K589 Irritable bowel syndrome without diarrhea: Secondary | ICD-10-CM | POA: Diagnosis present

## 2016-10-15 DIAGNOSIS — Z87891 Personal history of nicotine dependence: Secondary | ICD-10-CM | POA: Diagnosis not present

## 2016-10-15 DIAGNOSIS — K580 Irritable bowel syndrome with diarrhea: Secondary | ICD-10-CM | POA: Diagnosis present

## 2016-10-15 DIAGNOSIS — K219 Gastro-esophageal reflux disease without esophagitis: Secondary | ICD-10-CM | POA: Diagnosis present

## 2016-10-15 DIAGNOSIS — K2970 Gastritis, unspecified, without bleeding: Secondary | ICD-10-CM | POA: Diagnosis not present

## 2016-10-15 LAB — CBC WITH DIFFERENTIAL/PLATELET
Basophils Absolute: 0 10*3/uL (ref 0.0–0.1)
Basophils Relative: 2 %
Eosinophils Absolute: 0 10*3/uL (ref 0.0–0.7)
Eosinophils Relative: 1 %
HCT: 36 % (ref 36.0–46.0)
Hemoglobin: 12.3 g/dL (ref 12.0–15.0)
Lymphocytes Relative: 51 %
Lymphs Abs: 0.6 10*3/uL — ABNORMAL LOW (ref 0.7–4.0)
MCH: 29.1 pg (ref 26.0–34.0)
MCHC: 34.2 g/dL (ref 30.0–36.0)
MCV: 85.3 fL (ref 78.0–100.0)
Monocytes Absolute: 0.3 10*3/uL (ref 0.1–1.0)
Monocytes Relative: 29 %
Neutro Abs: 0.2 10*3/uL — ABNORMAL LOW (ref 1.7–7.7)
Neutrophils Relative %: 17 %
Platelets: 56 10*3/uL — ABNORMAL LOW (ref 150–400)
RBC: 4.22 MIL/uL (ref 3.87–5.11)
RDW: 13.1 % (ref 11.5–15.5)
WBC: 1.1 10*3/uL — CL (ref 4.0–10.5)

## 2016-10-15 LAB — URINALYSIS, ROUTINE W REFLEX MICROSCOPIC
Bilirubin Urine: NEGATIVE
Glucose, UA: NEGATIVE mg/dL
Hgb urine dipstick: NEGATIVE
Ketones, ur: 15 mg/dL — AB
Leukocytes, UA: NEGATIVE
Nitrite: NEGATIVE
Protein, ur: NEGATIVE mg/dL
Specific Gravity, Urine: <1.005 — ABNORMAL LOW (ref 1.005–1.030)
pH: 7 (ref 5.0–8.0)

## 2016-10-15 LAB — COMPREHENSIVE METABOLIC PANEL
ALT: 12 U/L — ABNORMAL LOW (ref 14–54)
AST: 15 U/L (ref 15–41)
Albumin: 3.5 g/dL (ref 3.5–5.0)
Alkaline Phosphatase: 36 U/L — ABNORMAL LOW (ref 38–126)
Anion gap: 7 (ref 5–15)
BUN: 5 mg/dL — ABNORMAL LOW (ref 6–20)
CO2: 29 mmol/L (ref 22–32)
Calcium: 8.5 mg/dL — ABNORMAL LOW (ref 8.9–10.3)
Chloride: 99 mmol/L — ABNORMAL LOW (ref 101–111)
Creatinine, Ser: 0.69 mg/dL (ref 0.44–1.00)
GFR calc Af Amer: >60 mL/min (ref >60)
GFR calc non Af Amer: >60 mL/min (ref >60)
Glucose, Bld: 126 mg/dL — ABNORMAL HIGH (ref 65–99)
Potassium: 3.1 mmol/L — ABNORMAL LOW (ref 3.5–5.1)
Sodium: 135 mmol/L (ref 135–145)
Total Bilirubin: 0.8 mg/dL (ref 0.3–1.2)
Total Protein: 6.8 g/dL (ref 6.5–8.1)

## 2016-10-15 LAB — LACTIC ACID, PLASMA
Lactic Acid, Venous: 1.3 mmol/L (ref 0.5–1.9)
Lactic Acid, Venous: 2.2 mmol/L (ref 0.5–1.9)

## 2016-10-15 LAB — TSH: TSH: 1.649 u[IU]/mL (ref 0.350–4.500)

## 2016-10-15 LAB — LIPASE, BLOOD: Lipase: 15 U/L (ref 11–51)

## 2016-10-15 LAB — MAGNESIUM: Magnesium: 1.6 mg/dL — ABNORMAL LOW (ref 1.7–2.4)

## 2016-10-15 MED ORDER — POTASSIUM CHLORIDE IN NACL 40-0.9 MEQ/L-% IV SOLN
INTRAVENOUS | Status: DC
  Administered 2016-10-15 – 2016-10-16 (×3): 100 mL/h via INTRAVENOUS

## 2016-10-15 MED ORDER — PROMETHAZINE HCL 25 MG/ML IJ SOLN
12.5000 mg | Freq: Once | INTRAMUSCULAR | Status: AC
  Administered 2016-10-15: 12.5 mg via INTRAVENOUS
  Filled 2016-10-15: qty 1

## 2016-10-15 MED ORDER — LEVOFLOXACIN IN D5W 750 MG/150ML IV SOLN
750.0000 mg | Freq: Once | INTRAVENOUS | Status: AC
  Administered 2016-10-15: 750 mg via INTRAVENOUS
  Filled 2016-10-15: qty 150

## 2016-10-15 MED ORDER — TEMAZEPAM 15 MG PO CAPS
15.0000 mg | ORAL_CAPSULE | Freq: Every day | ORAL | Status: DC
  Administered 2016-10-15 – 2016-10-16 (×2): 15 mg via ORAL
  Filled 2016-10-15 (×2): qty 1

## 2016-10-15 MED ORDER — PANTOPRAZOLE SODIUM 40 MG IV SOLR
40.0000 mg | Freq: Every day | INTRAVENOUS | Status: DC
  Administered 2016-10-15 – 2016-10-17 (×3): 40 mg via INTRAVENOUS
  Filled 2016-10-15 (×3): qty 40

## 2016-10-15 MED ORDER — METHYLPREDNISOLONE SODIUM SUCC 40 MG IJ SOLR
40.0000 mg | Freq: Once | INTRAMUSCULAR | Status: AC
  Administered 2016-10-15: 40 mg via INTRAVENOUS
  Filled 2016-10-15: qty 1

## 2016-10-15 MED ORDER — HYDROMORPHONE HCL 1 MG/ML IJ SOLN
1.0000 mg | INTRAMUSCULAR | Status: DC | PRN
  Administered 2016-10-15 – 2016-10-17 (×10): 1 mg via INTRAVENOUS
  Filled 2016-10-15 (×10): qty 1

## 2016-10-15 MED ORDER — POTASSIUM CHLORIDE CRYS ER 20 MEQ PO TBCR
40.0000 meq | EXTENDED_RELEASE_TABLET | Freq: Once | ORAL | Status: AC
  Administered 2016-10-15: 40 meq via ORAL
  Filled 2016-10-15: qty 2

## 2016-10-15 MED ORDER — ONDANSETRON HCL 4 MG/2ML IJ SOLN
4.0000 mg | Freq: Four times a day (QID) | INTRAMUSCULAR | Status: DC
  Administered 2016-10-15 – 2016-10-16 (×5): 4 mg via INTRAVENOUS
  Filled 2016-10-15 (×5): qty 2

## 2016-10-15 MED ORDER — ONDANSETRON HCL 4 MG PO TABS: 4.0000 mg | ORAL_TABLET | Freq: Four times a day (QID) | ORAL | Status: DC | PRN

## 2016-10-15 MED ORDER — ASPIRIN EC 81 MG PO TBEC
81.0000 mg | DELAYED_RELEASE_TABLET | ORAL | Status: DC
  Administered 2016-10-16: 81 mg via ORAL
  Filled 2016-10-15: qty 1

## 2016-10-15 MED ORDER — PROMETHAZINE HCL 25 MG/ML IJ SOLN
12.5000 mg | Freq: Four times a day (QID) | INTRAMUSCULAR | Status: DC | PRN
  Administered 2016-10-15: 12.5 mg via INTRAVENOUS
  Filled 2016-10-15: qty 1

## 2016-10-15 MED ORDER — AMLODIPINE BESYLATE 5 MG PO TABS
5.0000 mg | ORAL_TABLET | Freq: Every day | ORAL | Status: DC
  Administered 2016-10-16 – 2016-10-17 (×2): 5 mg via ORAL
  Filled 2016-10-15 (×2): qty 1

## 2016-10-15 MED ORDER — SODIUM CHLORIDE 0.9 % IV BOLUS (SEPSIS)
1000.0000 mL | Freq: Once | INTRAVENOUS | Status: AC
  Administered 2016-10-15: 1000 mL via INTRAVENOUS

## 2016-10-15 MED ORDER — BOOST / RESOURCE BREEZE PO LIQD
1.0000 | Freq: Three times a day (TID) | ORAL | Status: DC
  Administered 2016-10-15 – 2016-10-17 (×4): 1 via ORAL

## 2016-10-15 MED ORDER — ACETAMINOPHEN 650 MG RE SUPP: 650.0000 mg | Freq: Four times a day (QID) | RECTAL | Status: DC | PRN

## 2016-10-15 MED ORDER — CLINDAMYCIN PHOSPHATE 600 MG/50ML IV SOLN
600.0000 mg | Freq: Once | INTRAVENOUS | Status: AC
  Administered 2016-10-15: 600 mg via INTRAVENOUS
  Filled 2016-10-15: qty 50

## 2016-10-15 MED ORDER — HYDROMORPHONE HCL 1 MG/ML IJ SOLN
0.5000 mg | Freq: Once | INTRAMUSCULAR | Status: AC
Start: 2016-10-15 — End: 2016-10-15
  Administered 2016-10-15: 0.5 mg via INTRAVENOUS
  Filled 2016-10-15: qty 1

## 2016-10-15 MED ORDER — ONDANSETRON HCL 4 MG/2ML IJ SOLN: 4.0000 mg | Freq: Four times a day (QID) | INTRAMUSCULAR | Status: DC | PRN

## 2016-10-15 MED ORDER — METOPROLOL SUCCINATE ER 25 MG PO TB24
25.0000 mg | ORAL_TABLET | Freq: Every day | ORAL | Status: DC
  Administered 2016-10-16 – 2016-10-17 (×2): 25 mg via ORAL
  Filled 2016-10-15 (×2): qty 1

## 2016-10-15 MED ORDER — FILGRASTIM 480 MCG/1.6ML IJ SOLN
480.0000 ug | Freq: Every day | INTRAMUSCULAR | Status: DC
  Administered 2016-10-15 – 2016-10-16 (×2): 480 ug via SUBCUTANEOUS
  Filled 2016-10-15 (×4): qty 1.6

## 2016-10-15 MED ORDER — FLUOXETINE HCL 20 MG PO CAPS
40.0000 mg | ORAL_CAPSULE | Freq: Every day | ORAL | Status: DC
  Administered 2016-10-16 – 2016-10-17 (×2): 40 mg via ORAL
  Filled 2016-10-15 (×2): qty 2

## 2016-10-15 MED ORDER — LEVOFLOXACIN IN D5W 750 MG/150ML IV SOLN
750.0000 mg | INTRAVENOUS | Status: DC
  Administered 2016-10-16 – 2016-10-17 (×2): 750 mg via INTRAVENOUS
  Filled 2016-10-15 (×2): qty 150

## 2016-10-15 MED ORDER — ALBUTEROL SULFATE (2.5 MG/3ML) 0.083% IN NEBU: 2.5000 mg | INHALATION_SOLUTION | RESPIRATORY_TRACT | Status: DC | PRN

## 2016-10-15 MED ORDER — ACETAMINOPHEN 325 MG PO TABS
650.0000 mg | ORAL_TABLET | Freq: Four times a day (QID) | ORAL | Status: DC | PRN
  Administered 2016-10-16: 650 mg via ORAL
  Filled 2016-10-15 (×3): qty 2

## 2016-10-15 MED ORDER — MAGNESIUM SULFATE 2 GM/50ML IV SOLN
2.0000 g | Freq: Once | INTRAVENOUS | Status: AC
  Administered 2016-10-15: 2 g via INTRAVENOUS
  Filled 2016-10-15: qty 50

## 2016-10-15 MED ORDER — LEVOTHYROXINE SODIUM 25 MCG PO TABS
25.0000 ug | ORAL_TABLET | Freq: Every day | ORAL | Status: DC
  Administered 2016-10-16 – 2016-10-17 (×2): 25 ug via ORAL
  Filled 2016-10-15 (×2): qty 1

## 2016-10-15 MED ORDER — ESTRADIOL 1 MG PO TABS
2.0000 mg | ORAL_TABLET | Freq: Every day | ORAL | Status: DC
  Administered 2016-10-16 – 2016-10-17 (×2): 2 mg via ORAL
  Filled 2016-10-15 (×3): qty 2

## 2016-10-15 NOTE — Telephone Encounter (Signed)
Pt in ed at [facility] for neutrapenia

## 2016-10-15 NOTE — H&P (Signed)
History and Physical    Rebekah Henderson VHQ:[id number] DOB: Aug 09, 1956 DOA: 10/15/2016  PCP: Glo Herring., MD  Primary oncologist: Dr. Inda Merlin  Patient coming from: Home  Chief Complaint: Fever, chills, nausea with vomiting, and diarrhea.  HPI: Rebekah Henderson is a 60 y.o. 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 She received chemotherapy on 10/02/16. 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 Her symptoms started a couple days ago. She was prescribed Compazine and Zofran by her primary oncologist, but she was unable to keep it down. She denies associated abdominal pain, pain with urination, or coffee grounds emesis. She also complains of diarrhea worse then her "IBS diarrhea", having between 10 and 20 loose bowel movements over the last 2 days. She denies bright red blood per rectum or black tarry stools. She denies any recent antibiotics, recent travel, or sick contacts. She developed fever and chills last night with a temperature of 100.8. She also complains of achiness all over, but it is difficult to tell whether or not it's from her fibromyalgia or something else. She has chronic chest congestion and a cough.   ED Course: In the ED, she was hemodynamically stable and borderline febrile but temperature of 99.6. Her blood pressure was within normal limits. Her lab data revealed WBC of 1.1 with an ANC of 0.2, platelet count of 56, hemoglobin of 12.3, initial normal lactic acid, potassium of 3.1, magnesium of 1.6, unremarkable urinalysis, and chest x-ray that reveals COPD and persistent increased density at the right lung base. She is being admitted for further evaluation and management of neutropenic fever and intractable  nausea/vomiting/diarrhea.  Review of Systems: As per HPI otherwise 10 point review of systems negative.    Past Medical History:  Diagnosis Date  . Anxiety    takes Prozac daily  . Arthritis   . Arthritis   . Cancer ([place])    skin, lung  . Colon polyps   . COPD (chronic obstructive pulmonary disease) ([place])   . Diverticulitis   . Dyspnea    with exertion  . Fibromyalgia   . GERD (gastroesophageal reflux disease)    takes Pantoprazole daily  . Hypertension    takes Metoprolol,Triamterene-HCTZ,and Amlodipine daily  . Hypothyroidism    takes Synthroid daily  . IBS (irritable bowel syndrome)   . PONV (postoperative nausea and vomiting)    pt also states that she had some difficulty breathing after cervical fusion    Past Surgical History:  Procedure Laterality Date  . BIOPSY N/A 05/25/2013   Procedure: BIOPSIES (Random Colon; Duodenal; Gastric);  Surgeon: Danie Binder, MD;  Location: [facility];  Service: Endoscopy;  Laterality: N/A;  . BLADDER SUSPENSION    . BREAST ENHANCEMENT SURGERY    . BREAST IMPLANT REMOVAL    . CERVICAL FUSION  AUG 2013  . CHOLECYSTECTOMY  1999  . COLONOSCOPY  2007 [place]   POLYPS  . COLONOSCOPY WITH PROPOFOL N/A 05/25/2013   Procedure: COLONOSCOPY WITH PROPOFOL(at cecum 0957) total withdrawal time=24mn);  Surgeon: SDanie Binder MD;  Location: [facility];  Service: Endoscopy;  Laterality: N/A;  . ESOPHAGOGASTRODUODENOSCOPY (EGD) WITH PROPOFOL N/A 05/25/2013   Procedure: ESOPHAGOGASTRODUODENOSCOPY (EGD) WITH PROPOFOL;  Surgeon: SDanie Binder MD;  Location: [facility];  Service: Endoscopy;  Laterality:  N/A;  . FOOT SURGERY    . POLYPECTOMY N/A 05/25/2013   Procedure: POLYPECTOMY (Rectal and Gastric);  Surgeon: Danie Binder, MD;  Location: [facility];  Service: Endoscopy;  Laterality: N/A;  . TONSILLECTOMY    . UPPER GASTROINTESTINAL ENDOSCOPY    . VIDEO BRONCHOSCOPY WITH ENDOBRONCHIAL ULTRASOUND  09/12/2016   Procedure: VIDEO BRONCHOSCOPY WITH ENDOBRONCHIAL  ULTRASOUND AND BIOPSY;  Surgeon: Juanito Doom, MD;  Location: [facility];  Service: Cardiopulmonary;;    Social history: She is divorced. She has one son. She receives disability. She reports that she quit smoking about 12 years ago. She has a 40.00 pack-year smoking history. She has never used smokeless tobacco. She reports that she does not drink alcohol or use drugs.  Allergies  Allergen Reactions  . Penicillins Hives and Swelling    Has patient had a PCN reaction causing immediate rash, facial/tongue/throat swelling, SOB or lightheadedness with hypotension:unsure  Has patient had a PCN reaction causing severe rash involving mucus membranes or skin necrosis:unsure Has patient had a PCN reaction that required hospitalization:No Has patient had a PCN reaction occurring within the last 10 years:No If all of the above answers are "NO", then may proceed with Cephalosporin use.   . Codeine Nausea Only  . Ranitidine Hcl Other (See Comments)    dizzy  . Keflex [Cephalexin] Other (See Comments)    UNSPECIFIED REACTION   . Lyrica [Pregabalin] Other (See Comments)    lethargic    Family History  Problem Relation Age of Onset  . Breast cancer Mother   . Diabetes Maternal Grandfather   . Lung cancer Father   . Heart failure Sister 1    Died. Morbidly obese  . Colon cancer Neg Hx   . Colon polyps Neg Hx      Prior to Admission medications   Medication Sig Start Date End Date Taking? Authorizing Provider  albuterol (PROVENTIL) 2 MG tablet Take 2 mg by mouth daily as needed for wheezing or shortness of breath.    Yes Historical Provider, MD  amLODipine (NORVASC) 5 MG tablet Take 5 mg by mouth daily.     Yes Historical Provider, MD  aspirin EC 81 MG tablet Take 81 mg by mouth every other day.    Yes Historical Provider, MD  estazolam (PROSOM) 2 MG tablet Take 2 mg by mouth at bedtime.   Yes Historical Provider, MD  estradiol (ESTRACE) 2 MG tablet Take 2 mg by mouth daily.     Yes  Historical Provider, MD  FLUoxetine (PROZAC) 40 MG capsule Take 40 mg by mouth daily.  06/03/15  Yes Historical Provider, MD  HYDROcodone-acetaminophen (NORCO/VICODIN) 5-325 MG per tablet Take 1 tablet by mouth every 6 (six) hours as needed for pain.  03/29/13  Yes Historical Provider, MD  levothyroxine (SYNTHROID, LEVOTHROID) 25 MCG tablet Take 25 mcg by mouth daily.     Yes Historical Provider, MD  magnesium oxide (MAG-OX) 400 (241.3 Mg) MG tablet Take 1 tablet (400 mg total) by mouth 3 (three) times daily. 10/14/16  Yes Curt Bears, MD  metoprolol succinate (TOPROL-XL) 25 MG 24 hr tablet Take 25 mg by mouth daily.   Yes Historical Provider, MD  ondansetron (ZOFRAN) 8 MG tablet Take 1 tablet (8 mg total) by mouth 3 (three) times daily as needed for nausea or vomiting. 10/08/16  Yes Curt Bears, MD  pantoprazole (PROTONIX) 40 MG tablet Take 40 mg by mouth 2 (two) times daily.    Yes Historical Provider, MD  PROAIR HFA 108 (90 BASE) MCG/ACT inhaler Inhale 1-2 puffs into the lungs every 4 (four) hours as needed.  05/29/15  Yes Historical Provider, MD  prochlorperazine (COMPAZINE) 10 MG tablet Take 1 tablet (10 mg total) by mouth every 6 (six) hours as needed for nausea or vomiting. 09/19/16  Yes Curt Bears, MD  triamterene-hydrochlorothiazide (DYAZIDE) 37.5-25 MG per capsule Take 1 capsule by mouth every morning.     Yes Historical Provider, MD    Physical Exam: Vitals:   10/15/16 0930 10/15/16 0947 10/15/16 1000 10/15/16 1100  BP: 151/79  139/79 141/86  Pulse:   86   Resp:      Temp:  99.6 F (37.6 C)    TempSrc:  Rectal    SpO2:   (!) 87%   Weight:      Height:          Constitutional: Ill-appearing 60 year old woman, but in no acute distress. Vitals:   10/15/16 0930 10/15/16 0947 10/15/16 1000 10/15/16 1100  BP: 151/79  139/79 141/86  Pulse:   86   Resp:      Temp:  99.6 F (37.6 C)    TempSrc:  Rectal    SpO2:   (!) 87%   Weight:      Height:       Eyes: PERRL,  lids and conjunctivae normal ENMT: Mucous membranes are mildly dry. Posterior pharynx clear of any exudate or lesions.Normal dentition.  Neck: normal, supple, no masses, no thyromegaly Respiratory: clear to auscultation bilaterally, no wheezing, no crackles; decreased breath sounds in the bases. Normal respiratory effort. No accessory muscle use.  Cardiovascular: Regular rate and rhythm, no murmurs / rubs / gallops. No extremity edema. 2+ pedal pulses. No carotid bruits.  Abdomen: no tenderness, no masses palpated. No hepatosplenomegaly. Bowel sounds positive.  Musculoskeletal: no clubbing / cyanosis. No joint deformity upper and lower extremities. Good ROM, no contractures. Normal muscle tone.  Skin: Maculopapular rash noted with nodularity on both legs with mild tenderness. Neurologic: CN 2-12 grossly intact. Sensation intact, DTR normal. Strength 5/5 in all 4.  Psychiatric: Normal judgment and insight. Alert and oriented x 3. Normal mood.     Labs on Admission: I have personally reviewed following labs and imaging studies  CBC:  Recent Labs Lab 10/09/16 1414 10/15/16 0857  WBC 4.6 1.1*  NEUTROABS 2.8 0.2*  HGB 14.5 12.3  HCT 43.8 36.0  MCV 86.2 85.3  PLT 224 56*   Basic Metabolic Panel:  Recent Labs Lab 10/09/16 1415 10/15/16 0857  NA 140 135  K 3.8 3.1*  CL  --  99*  CO2 27 29  GLUCOSE 90 126*  BUN 11.7 5*  CREATININE 0.7 0.69  CALCIUM 9.5 8.5*  MG 1.4* 1.6*   GFR: Estimated Creatinine Clearance: 76.7 mL/min (by C-G formula based on SCr of 0.69 mg/dL). Liver Function Tests:  Recent Labs Lab 10/09/16 1415 10/15/16 0857  AST 19 15  ALT 20 12*  ALKPHOS 51 36*  BILITOT 0.74 0.8  PROT 7.1 6.8  ALBUMIN 3.5 3.5    Recent Labs Lab 10/15/16 0857  LIPASE 15   No results for input(s): AMMONIA in the last 168 hours. Coagulation Profile: No results for input(s): INR, PROTIME in the last 168 hours. Cardiac Enzymes: No results for input(s): CKTOTAL, CKMB,  CKMBINDEX, TROPONINI in the last 168 hours. BNP (last 3 results) No results for input(s): PROBNP in the last 8760 hours. HbA1C: No results for input(s): HGBA1C in the last 72 hours.  CBG: No results for input(s): GLUCAP in the last 168 hours. Lipid Profile: No results for input(s): CHOL, HDL, LDLCALC, TRIG, CHOLHDL, LDLDIRECT in the last 72 hours. Thyroid Function Tests: No results for input(s): TSH, T4TOTAL, FREET4, T3FREE, THYROIDAB in the last 72 hours. Anemia Panel: No results for input(s): VITAMINB12, FOLATE, FERRITIN, TIBC, IRON, RETICCTPCT in the last 72 hours. Urine analysis:    Component Value Date/Time   COLORURINE YELLOW 10/15/2016 [place] 10/15/2016 0927   LABSPEC <1.005 (L) 10/15/2016 0927   PHURINE 7.0 10/15/2016 0927   GLUCOSEU NEGATIVE 10/15/2016 0927   HGBUR NEGATIVE 10/15/2016 0927   BILIRUBINUR NEGATIVE 10/15/2016 0927   KETONESUR 15 (A) 10/15/2016 0927   PROTEINUR NEGATIVE 10/15/2016 0927   UROBILINOGEN 0.2 03/10/2014 1027   NITRITE NEGATIVE 10/15/2016 0927   LEUKOCYTESUR NEGATIVE 10/15/2016 0927   Sepsis Labs: !!!!!!!!!!!!!!!!!!!!!!!!!!!!!!!!!!!!!!!!!!!! '@LABRCNTIP'$ (procalcitonin:4,lacticidven:4) )No results found for this or any previous visit (from the past 240 hour(s)).   Radiological Exams on Admission: Dg Chest 2 View  Result Date: 10/15/2016 CLINICAL DATA:  Onset of fever with nausea vomiting and diarrhea last night. Patient is undergoing treatment for lung malignancy. Patient is a former smoker with history of COPD EXAM: CHEST  2 VIEW COMPARISON:  PA and lateral chest x-ray of August 27, 2016 FINDINGS: The lungs are mildly hyperinflated. There is no focal infiltrate. There is persistent increased density at the right lung base which follows the contour of the hemidiaphragm. This is new since October 2016 but not significantly changed since August 27, 2016. Soft tissue fullness in the AP window region persists. There is faint  calcification in the wall of the aortic arch. The heart and pulmonary vascularity are normal. The observed bony thorax exhibits no acute abnormality. The patient has undergone previous lower anterior cervical fusion. IMPRESSION: COPD. Persistent increased density at the right lung base. Persistent AP window lymphadenopathy. Chest CT scanning is recommended for further evaluation of the mediastinum and right lower lung. Thoracic aortic atherosclerosis. Electronically Signed   By: David  Martinique M.D.   On: 10/15/2016 54:56    EKG: Not applicable  Assessment/Plan Principal Problem:   Neutropenic fever (HCC) Active Problems:   Small cell lung cancer, left (HCC)   Intractable nausea and vomiting   Diarrhea   Pancytopenia due to antineoplastic chemotherapy (HCC)   Hypokalemia   Hypertension   Fibromyalgia   IBS (irritable bowel syndrome)   Hypothyroidism   Nodular rash   Hypomagnesemia   Chronic respiratory failure with hypoxia (HCC)    1. Neutropenic fever. Blood cultures were ordered in the ED and the patient was given clindamycin and Levaquin. She has no obvious urinary tract infection or pneumonia. The patient may have an infectious diarrhea, but she has chronic diarrhea from IBS. -We'll continue IV Levaquin. Will stop clindamycin for now. Will check blood cultures daily. -Discussed patient with her primary oncologist, Dr. Earlie Server who recommended Neupogen daily until her [place] is 1000 or greater. (Apparently she was not given Neulasta following chemotherapy).  Small cell lung cancer. The patient was recently diagnosed and was started on chemotherapy. She also received 1 course of XRT. She will follow-up with Dr. Earlie Server after discharge.  Intractable nausea and vomiting. The etiology is unclear at this time, but could be a delayed response from the chemotherapy 2 weeks ago. Her LFTs and lipase are within normal limits. She has no abdominal tenderness. -We will treat with scheduled  Zofran IV every 6 hours and when necessary IV Phenergan. Will  give IV Protonix daily empirically. We'll cautiously start a clear liquid diet.  Diarrhea. Patient has chronic diarrhea per her account due to IBS. However in the setting of immunosuppression, will order C. difficile PCR. Will start IV fluids for hydration.  Pancytopenia secondary to chemotherapy. -As per #1, will start Neupogen daily until her [place] is 1000 greater.  Hypokalemia and hypomagnesemia. Patient's serum potassium was 3.1 and her magnesium was 1.6 on admission. She was given oral potassium and IV magnesium in the ED. -Due to the nausea and vomiting, will hold on oral potassium and will add potassium to the IV fluids.   Hypertension. Patient is treated chronically with Dyazide, amlodipine and metoprolol. We'll hold Dyazide. Will restart amlodipine and metoprolol on 10/16/16 as tolerated.   Chronic respiratory failure with hypoxia secondary to COPD. Patient was off of oxygen in the ED. He will be reapplied at 2 L. We'll continue when necessary bronchodilators.  Hypothyroidism. Synthroid will be restarted. Will order TSH.  Nodular rash on legs. On exam, a nodular maculopapular rash was noticed. The patient had not realized it. She does not recall it being there prior to coming to the ED. The etiology is unknown. She has taken Levaquin in the past with no allergic reactions. She did receive clindamycin. Not sure if the etiology is clindamycin or from chemotherapy. Nevertheless, she will be given Solu-Medrol 1 and will continue to monitor.    DVT prophylaxis: SCDs Code Status: Full code Family Communication: Discussed with family friends with permission by patient Disposition Plan: Discharge to home when clinically appropriate Consults called: Curbside consult with primary oncologist, Dr. Earlie Server Admission status: Inpatient medical.   Rexene Alberts MD [facility] Pager [phone]  If 7PM-7AM, please  contact night-coverage [email or web address] Password [email or web address]  10/15/2016, 3:04 PM

## 2016-10-15 NOTE — ED Triage Notes (Signed)
Per ems, pt had radiation treatment for lung cancer yesterday and started having fever, n/v/d last night.  EMS administered '4mg'$  zofran IV pta.

## 2016-10-15 NOTE — Telephone Encounter (Signed)
Called received @ 1230 from  Caregiver wanted to make Dr.Mohamed aware that pt was Currently being seen in the [facility] ER Dept. Call was transferred to RN vmail/message left.

## 2016-10-15 NOTE — ED Notes (Signed)
WBC-1.1, EDP aware.

## 2016-10-15 NOTE — ED Notes (Signed)
Patient placed on Neutropenic precautions.

## 2016-10-15 NOTE — ED Provider Notes (Signed)
[facility] DEPT Provider Note   CSN: [id number] Arrival date & time: 10/15/16  0818     History   Chief Complaint Chief Complaint  Patient presents with  . Emesis  . Diarrhea    HPI Rebekah Henderson is a 60 y.o. female.  HPI   60 year old female with nausea, vomiting and diarrhea. Symptom onset last night. Patient has a past history of lung cancer. Currently undergoing active treatment. Reports subjective fever last night and then recorded temperature 100.8 at home shortly before arrival. Denies any acute pain. No acute respiratory complaints. Denies any sick contacts. No blood in her stool or emesis.  Past Medical History:  Diagnosis Date  . Anxiety    takes Prozac daily  . Arthritis   . Arthritis   . Cancer ([place])    skin, lung  . Colon polyps   . COPD (chronic obstructive pulmonary disease) ([place])   . Diverticulitis   . Dyspnea    with exertion  . Fibromyalgia   . GERD (gastroesophageal reflux disease)    takes Pantoprazole daily  . Hypertension    takes Metoprolol,Triamterene-HCTZ,and Amlodipine daily  . Hypothyroidism    takes Synthroid daily  . IBS (irritable bowel syndrome)   . PONV (postoperative nausea and vomiting)    pt also states that she had some difficulty breathing after cervical fusion    Patient Active Problem List   Diagnosis Date Noted  . 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 Mediastinal mass 09/10/2016  . COPD (chronic obstructive pulmonary disease) ([place])   . Hypertension   . Fibromyalgia   . IBS (irritable bowel syndrome)   . GERD (gastroesophageal reflux disease)   . Hypothyroidism   . Colon polyps   . Anxiety   . Arthritis   . PONV (postoperative nausea and vomiting)   . Cancer ([place])   . Diverticulitis   . Abdominal pain, other specified site 05/12/2013  . Diarrhea 05/12/2013    Past Surgical History:  Procedure Laterality Date  . BIOPSY N/A 05/25/2013   Procedure: BIOPSIES (Random Colon; Duodenal; Gastric);   Surgeon: Danie Binder, MD;  Location: [facility];  Service: Endoscopy;  Laterality: N/A;  . BLADDER SUSPENSION    . BREAST ENHANCEMENT SURGERY    . BREAST IMPLANT REMOVAL    . CERVICAL FUSION  AUG 2013  . CHOLECYSTECTOMY  1999  . COLONOSCOPY  2007 [facility]   POLYPS  . COLONOSCOPY WITH PROPOFOL N/A 05/25/2013   Procedure: COLONOSCOPY WITH PROPOFOL(at cecum 0957) total withdrawal time=70mn);  Surgeon: SDanie Binder MD;  Location: [facility];  Service: Endoscopy;  Laterality: N/A;  . ESOPHAGOGASTRODUODENOSCOPY (EGD) WITH PROPOFOL N/A 05/25/2013   Procedure: ESOPHAGOGASTRODUODENOSCOPY (EGD) WITH PROPOFOL;  Surgeon: SDanie Binder MD;  Location: [facility];  Service: Endoscopy;  Laterality: N/A;  . FOOT SURGERY    . POLYPECTOMY N/A 05/25/2013   Procedure: POLYPECTOMY (Rectal and Gastric);  Surgeon: SDanie Binder MD;  Location: [facility];  Service: Endoscopy;  Laterality: N/A;  . TONSILLECTOMY    . UPPER GASTROINTESTINAL ENDOSCOPY    . VIDEO BRONCHOSCOPY WITH ENDOBRONCHIAL ULTRASOUND  09/12/2016   Procedure: VIDEO BRONCHOSCOPY WITH ENDOBRONCHIAL ULTRASOUND AND BIOPSY;  Surgeon: DJuanito Doom MD;  Location: [facility];  Service: Cardiopulmonary;;    OB History    No data available       Home Medications    Prior to Admission medications   Medication Sig Start Date End Date Taking? Authorizing Provider  albuterol (PROVENTIL) 2  MG tablet Take 2 mg by mouth daily as needed for wheezing or shortness of breath.     Historical Provider, MD  amLODipine (NORVASC) 5 MG tablet Take 5 mg by mouth daily.      Historical Provider, MD  aspirin EC 81 MG tablet Take 81 mg by mouth daily.    Historical Provider, MD  dicyclomine (BENTYL) 20 MG tablet Take 20 mg by mouth 3 (three) times daily as needed (for stomach upset).    Historical Provider, MD  estazolam (PROSOM) 2 MG tablet Take 2 mg by mouth at bedtime.    Historical Provider, MD  estradiol (ESTRACE) 2 MG tablet Take 2 mg by mouth daily.      Historical  Provider, MD  FLUoxetine (PROZAC) 40 MG capsule Take 40 mg by mouth daily.  06/03/15   Historical Provider, MD  HYDROcodone-acetaminophen (NORCO/VICODIN) 5-325 MG per tablet Take 1 tablet by mouth every 6 (six) hours as needed for pain.  03/29/13   Historical Provider, MD  levothyroxine (SYNTHROID, LEVOTHROID) 25 MCG tablet Take 25 mcg by mouth daily.      Historical Provider, MD  magnesium oxide (MAG-OX) 400 (241.3 Mg) MG tablet Take 1 tablet (400 mg total) by mouth 3 (three) times daily. 10/14/16   Curt Bears, MD  metoprolol succinate (TOPROL-XL) 25 MG 24 hr tablet Take 25 mg by mouth daily.    Historical Provider, MD  ondansetron (ZOFRAN) 8 MG tablet Take 1 tablet (8 mg total) by mouth 3 (three) times daily as needed for nausea or vomiting. Patient not taking: Reported on 10/09/2016 10/08/16   Curt Bears, MD  pantoprazole (PROTONIX) 40 MG tablet Take 40 mg by mouth 2 (two) times daily.     Historical Provider, MD  PROAIR HFA 108 (90 BASE) MCG/ACT inhaler Inhale 1-2 puffs into the lungs every 4 (four) hours as needed.  05/29/15   Historical Provider, MD  prochlorperazine (COMPAZINE) 10 MG tablet Take 1 tablet (10 mg total) by mouth every 6 (six) hours as needed for nausea or vomiting. 09/19/16   Curt Bears, MD  triamterene-hydrochlorothiazide (DYAZIDE) 37.5-25 MG per capsule Take 1 capsule by mouth every morning.      Historical Provider, MD    Family History Family History  Problem Relation Age of Onset  . Breast cancer Mother   . Diabetes Maternal Grandfather   . Lung cancer Father   . Heart failure Sister 55    Died. Morbidly obese  . Colon cancer Neg Hx   . Colon polyps Neg Hx     Social History Social History  Substance Use Topics  . Smoking status: Former Smoker    Packs/day: 2.00    Years: 20.00    Quit date: 05/20/2004  . Smokeless tobacco: Never Used  . Alcohol use No     Allergies   Penicillins; Codeine; Ranitidine hcl; Keflex [cephalexin]; and Lyrica  [pregabalin]   Review of Systems Review of Systems  All systems reviewed and negative, other than as noted in HPI.  Physical Exam Updated Vital Signs BP 140/75 (BP Location: Right Arm)   Pulse 88   Temp 99.6 F (37.6 C) (Rectal)   Resp 20   Ht '5\' 4"'$  (1.626 m)   Wt 186 lb (84.4 kg)   SpO2 92%   BMI 31.93 kg/m   Physical Exam  Constitutional: She appears well-developed and well-nourished. No distress.  HENT:  Head: Normocephalic and atraumatic.  Eyes: Conjunctivae are normal. Right eye exhibits no discharge. Left eye  exhibits no discharge.  Neck: Neck supple.  Cardiovascular: Normal rate, regular rhythm and normal heart sounds.  Exam reveals no gallop and no friction rub.   No murmur heard. Pulmonary/Chest: Effort normal and breath sounds normal. No respiratory distress.  Abdominal: Soft. She exhibits no distension. There is no tenderness.  Musculoskeletal: She exhibits no edema or tenderness.  Neurological: She is alert.  Skin: Skin is warm and dry.  Psychiatric: She has a normal mood and affect. Her behavior is normal. Thought content normal.  Nursing note and vitals reviewed.    ED Treatments / Results  Labs (all labs ordered are listed, but only abnormal results are displayed) Labs Reviewed  CBC WITH DIFFERENTIAL/PLATELET - Abnormal; Notable for the following:       Result Value   WBC 1.1 (*)    Platelets 56 (*)    Neutro Abs 0.2 (*)    Lymphs Abs 0.6 (*)    All other components within normal limits  COMPREHENSIVE METABOLIC PANEL - Abnormal; Notable for the following:    Potassium 3.1 (*)    Chloride 99 (*)    Glucose, Bld 126 (*)    BUN 5 (*)    Calcium 8.5 (*)    ALT 12 (*)    Alkaline Phosphatase 36 (*)    All other components within normal limits  URINALYSIS, ROUTINE W REFLEX MICROSCOPIC (NOT AT [facility]) - Abnormal; Notable for the following:    Specific Gravity, Urine <1.005 (*)    Ketones, ur 15 (*)    All other components within normal limits    MAGNESIUM - Abnormal; Notable for the following:    Magnesium 1.6 (*)    All other components within normal limits  LACTIC ACID, PLASMA - Abnormal; Notable for the following:    Lactic Acid, Venous 2.2 (*)    All other components within normal limits  CBC WITH DIFFERENTIAL/PLATELET - Abnormal; Notable for the following:    WBC 1.0 (*)    RBC 3.72 (*)    Hemoglobin 11.0 (*)    HCT 32.2 (*)    Platelets 75 (*)    Neutro Abs 0.1 (*)    Lymphs Abs 0.3 (*)    All other components within normal limits  COMPREHENSIVE METABOLIC PANEL - Abnormal; Notable for the following:    Glucose, Bld 139 (*)    Calcium 8.1 (*)    Total Protein 6.2 (*)    Albumin 3.1 (*)    AST 12 (*)    ALT 11 (*)    Alkaline Phosphatase 29 (*)    Anion gap 4 (*)    All other components within normal limits  CBC WITH DIFFERENTIAL/PLATELET - Abnormal; Notable for the following:    WBC 3.6 (*)    Platelets 133 (*)    Neutro Abs 1.1 (*)    All other components within normal limits  CULTURE, BLOOD (ROUTINE X 2)  CULTURE, BLOOD (ROUTINE X 2)  LIPASE, BLOOD  LACTIC ACID, PLASMA  MAGNESIUM  TSH    EKG  EKG Interpretation None       Radiology No results found.   Procedures Procedures (including critical care time)  Medications Ordered in ED Medications  sodium chloride 0.9 % bolus 1,000 mL (not administered)  magnesium sulfate IVPB 2 g 50 mL (not administered)  potassium chloride SA (K-DUR,KLOR-CON) CR tablet 40 mEq (not administered)  clindamycin (CLEOCIN) IVPB 600 mg (not administered)  levofloxacin (LEVAQUIN) IVPB 750 mg (not administered)  promethazine (PHENERGAN) injection  12.5 mg (12.5 mg Intravenous Given 10/15/16 0856)  HYDROmorphone (DILAUDID) injection 0.5 mg (0.5 mg Intravenous Given 10/15/16 0855)     Initial Impression / Assessment and Plan / ED Course  I have reviewed the triage vital signs and the nursing notes.  Pertinent labs & imaging results that were available during my care  of the patient were reviewed by me and considered in my medical decision making (see chart for details).  Clinical Course     60yF with recently diagnosed small cell carcinoma of lung on chemo/radiation. ANC 200. Afebrile in ED but she reports temp of 100.8 earlier today. Per IDSA guidelines, she is high-risk with neutropenic fever because of hx of chronic underlying lung disease on ongoing GI symptoms. Abdominal exam is benign. Will obtain blood cultures, CXR and cdiff.  PCN/cephalosporin allergy. She does not have an indwelling catheter.  Empiric abx with clindamycin/levaquin.   Final Clinical Impressions(s) / ED Diagnoses   Final diagnoses:  Neutropenic fever ([place])  Thrombocytopenia ([place])    New Prescriptions New Prescriptions   No medications on file     Virgel Manifold, MD 10/28/16 1124

## 2016-10-16 ENCOUNTER — Inpatient Hospital Stay (HOSPITAL_COMMUNITY): Payer: Medicare Other

## 2016-10-16 LAB — CBC WITH DIFFERENTIAL/PLATELET
BASOS PCT: 0 %
Basophils Absolute: 0 10*3/uL (ref 0.0–0.1)
EOS ABS: 0 10*3/uL (ref 0.0–0.7)
EOS PCT: 0 %
HCT: 32.2 % — ABNORMAL LOW (ref 36.0–46.0)
Hemoglobin: 11 g/dL — ABNORMAL LOW (ref 12.0–15.0)
Lymphocytes Relative: 31 %
Lymphs Abs: 0.3 10*3/uL — ABNORMAL LOW (ref 0.7–4.0)
MCH: 29.6 pg (ref 26.0–34.0)
MCHC: 34.2 g/dL (ref 30.0–36.0)
MCV: 86.6 fL (ref 78.0–100.0)
MONO ABS: 0.5 10*3/uL (ref 0.1–1.0)
MONOS PCT: 57 %
Neutro Abs: 0.1 10*3/uL — ABNORMAL LOW (ref 1.7–7.7)
Neutrophils Relative %: 13 %
Platelets: 75 10*3/uL — ABNORMAL LOW (ref 150–400)
RBC: 3.72 MIL/uL — ABNORMAL LOW (ref 3.87–5.11)
RDW: 13.2 % (ref 11.5–15.5)
WBC: 1 10*3/uL — CL (ref 4.0–10.5)

## 2016-10-16 LAB — COMPREHENSIVE METABOLIC PANEL
ALBUMIN: 3.1 g/dL — AB (ref 3.5–5.0)
ALT: 11 U/L — ABNORMAL LOW (ref 14–54)
ANION GAP: 4 — AB (ref 5–15)
AST: 12 U/L — ABNORMAL LOW (ref 15–41)
Alkaline Phosphatase: 29 U/L — ABNORMAL LOW (ref 38–126)
BILIRUBIN TOTAL: 0.6 mg/dL (ref 0.3–1.2)
BUN: 6 mg/dL (ref 6–20)
CO2: 26 mmol/L (ref 22–32)
Calcium: 8.1 mg/dL — ABNORMAL LOW (ref 8.9–10.3)
Chloride: 106 mmol/L (ref 101–111)
Creatinine, Ser: 0.61 mg/dL (ref 0.44–1.00)
GFR calc Af Amer: >60 mL/min (ref >60)
GFR calc non Af Amer: >60 mL/min (ref >60)
GLUCOSE: 139 mg/dL — AB (ref 65–99)
POTASSIUM: 4.1 mmol/L (ref 3.5–5.1)
SODIUM: 136 mmol/L (ref 135–145)
TOTAL PROTEIN: 6.2 g/dL — AB (ref 6.5–8.1)

## 2016-10-16 LAB — MAGNESIUM: Magnesium: 1.9 mg/dL (ref 1.7–2.4)

## 2016-10-16 MED ORDER — ENOXAPARIN SODIUM 40 MG/0.4ML ~~LOC~~ SOLN
40.0000 mg | SUBCUTANEOUS | Status: DC
  Administered 2016-10-16: 40 mg via SUBCUTANEOUS
  Filled 2016-10-16: qty 0.4

## 2016-10-16 NOTE — Care Management Note (Signed)
Case Management Note  Patient Details  Name: Rebekah Henderson MRN: [id number] Date of Birth: 02-22-1956  Subjective/Objective:  Patient adm with n/v/d. She lives alone, has a cane and walker if needed. She uses oxygen at night. She has PCP and insurance, reports no issues. Currently on chemo and radiation for small cell lung carcinoma.                   Action/Plan: Anticipate DC home with self care.   Expected Discharge Date:      10/16/2016            Expected Discharge Plan:  Home/Self Care  In-House Referral:  NA  Discharge planning Services  CM Consult  Post Acute Care Choice:  NA Choice offered to:  NA  DME Arranged:    DME Agency:     HH Arranged:    HH Agency:     Status of Service:  Completed, signed off  If discussed at [employer] of Stay Meetings, dates discussed:    Additional Comments:  Ramia Sidney, Chauncey Reading, RN 10/16/2016, 10:46 AM

## 2016-10-16 NOTE — Progress Notes (Signed)
Initial Nutrition Assessment  DOCUMENTATION CODES:  Obesity unspecified  INTERVENTION:  Continue Boost Breeze TID. Will change once diet advanced  Will order 30 mL Prostat BID, each supplement provides 100 kcal and 15 grams of protein.  Gave education on appropriate diet for cancer patient undergoing treatment  Left coupons and handouts titled "Making the Most of Each Bite"  NUTRITION DIAGNOSIS:  Increased nutrient needs related to cancer and cancer related treatments as evidenced by estimated nutritional requirements for this condition  GOAL:  Patient will meet greater than or equal to 90% of their needs  MONITOR:  PO intake, Supplement acceptance, Diet advancement, Labs, I & O's  REASON FOR ASSESSMENT:  Malnutrition Screening Tool    ASSESSMENT:  60 y/o female PMHx Anxiety, COPD, Diverticulitis, IBS, HTN and recently diagnosed SCLC undergoing chemoradiation. Presents w/ fever, chills, nausea, vomiting diarrhea. Worked up for neutropenic fever and admitted for eval/management.    Pt reports that she has not eaten in 2 days. Her symptoms began Sunday. Prior to that, her only side effect from cancer and treatment was severe fatigue and some nausea that was well controlled with her zofran.  At baseline she eats 2 meals a day. She avoids seeds due to her hx of diverticulitis. She took Vit D and Vit k. She recently was placed on magnesium as well.    UBW appears to be 200-205 lbs. She looks to have lost 5-10 lbs in the last month  Pt states that she is a "veggie person". RD provided education on appropriate diet for pt with cancer undergoing treatment. Discussed prioritize eating high kcal/protein foods as able and eating frequently. She says she is not a meat person and doesn't eat this frequently. We discussed alternative high protein-calorie foods such as PB, eggs, Legumes. Recommended that she start trying supplements as these can be used when she is to fatigued/ill to make meals.  She reports having good social support w/ friends/neighbors who provide meals.    She is currently on CL diet. She was agreeable to [employer]. When diet is advanced she was open to trying other supplements.   NFPE: WDL  Labs:WBC: 1.0, Lactic Acid: 2.2, Albumin: 3.1 Medications: Resource Breeze, IV ABx, Zofran, PPi, Dilaudid, IVF w/ KCL   Recent Labs Lab 10/09/16 1415 10/15/16 0857 10/16/16 0610  NA 140 135 136  K 3.8 3.1* 4.1  CL  --  99* 106  CO2 '27 29 26  '$ BUN 11.7 5* 6  CREATININE 0.7 0.69 0.61  CALCIUM 9.5 8.5* 8.1*  MG 1.4* 1.6* 1.9  GLUCOSE 90 126* 139*   Diet Order:  Diet clear liquid Room service appropriate? Yes; Fluid consistency: Thin  Skin:  Rash to leg   Last BM:  11/15- diarhea  Height:  Ht Readings from Last 1 Encounters:  10/15/16 '5\' 4"'$  (1.626 m)   Weight:  Wt Readings from Last 1 Encounters:  10/16/16 186 lb 8 oz (84.6 kg)   Wt Readings from Last 10 Encounters:  10/16/16 186 lb 8 oz (84.6 kg)  10/09/16 187 lb 1.6 oz (84.9 kg)  09/19/16 191 lb 3.2 oz (86.7 kg)  09/12/16 195 lb (88.5 kg)  09/10/16 195 lb (88.5 kg)  11/14/15 204 lb (92.5 kg)  11/09/15 204 lb 6.4 oz (92.7 kg)  09/20/15 201 lb (91.2 kg)  07/20/15 206 lb (93.4 kg)  07/04/13 200 lb (90.7 kg)   Ideal Body Weight:  54.54 kg  BMI:  Body mass index is 32.01 kg/m.  Estimated  Nutritional Needs:  Kcal:  1700-1850 (20-22 kcal/kg bw) Protein:  71-82 g (1.3-1.5 g/kg ibw) Fluid:  1.7-1.9 L fluid (1 ml/kcal) + enough to replace stool losses  EDUCATION NEEDS:  Education needs addressed  Burtis Junes RD, LDN, [facility] Pager: [phone] 10/16/2016 2:07 PM

## 2016-10-16 NOTE — Care Management Important Message (Signed)
Important Message  Patient Details  Name: Rebekah Henderson MRN: [id number] Date of Birth: 11-Jan-1956   Medicare Important Message Given:  Yes    Kelechi Astarita, Chauncey Reading, RN 10/16/2016, 10:49 AM

## 2016-10-16 NOTE — Progress Notes (Signed)
ANTICOAGULATION CONSULT NOTE - Initial Consult  Pharmacy Consult for Lovenox Indication: VTE prophylaxis  Allergies  Allergen Reactions  . Penicillins Hives and Swelling    Has patient had a PCN reaction causing immediate rash, facial/tongue/throat swelling, SOB or lightheadedness with hypotension:unsure  Has patient had a PCN reaction causing severe rash involving mucus membranes or skin necrosis:unsure Has patient had a PCN reaction that required hospitalization:No Has patient had a PCN reaction occurring within the last 10 years:No If all of the above answers are "NO", then may proceed with Cephalosporin use.   . Codeine Nausea Only  . Ranitidine Hcl Other (See Comments)    dizzy  . Keflex [Cephalexin] Other (See Comments)    UNSPECIFIED REACTION   . Lyrica [Pregabalin] Other (See Comments)    lethargic   Patient Measurements: Height: '5\' 4"'$  (162.6 cm) Weight: 186 lb 8 oz (84.6 kg) IBW/kg (Calculated) : 54.7  Vital Signs: Temp: 97.6 F (36.4 C) (11/15 0518) Temp Source: Oral (11/15 0518) BP: 123/74 (11/15 0518) Pulse Rate: 80 (11/15 0518)  Labs:  Recent Labs  10/15/16 0857 10/16/16 0610  HGB 12.3 11.0*  HCT 36.0 32.2*  PLT 56* 75*  CREATININE 0.69 0.61   Estimated Creatinine Clearance: 76.8 mL/min (by C-G formula based on SCr of 0.61 mg/dL).  Medical History: Past Medical History:  Diagnosis Date  . Anxiety    takes Prozac daily  . Arthritis   . Arthritis   . Cancer ([place])    skin, lung  . Colon polyps   . COPD (chronic obstructive pulmonary disease) ([place])   . Diverticulitis   . Dyspnea    with exertion  . Fibromyalgia   . GERD (gastroesophageal reflux disease)    takes Pantoprazole daily  . Hypertension    takes Metoprolol,Triamterene-HCTZ,and Amlodipine daily  . Hypothyroidism    takes Synthroid daily  . IBS (irritable bowel syndrome)   . PONV (postoperative nausea and vomiting)    pt also states that she had some difficulty breathing after  cervical fusion   Assessment: 60 y/o woman admitted from home on 11/14 with fever, n/v and diarrhea. She has SCLC and is currently receiving treatment. Was found to be neutropenic. Goal of Therapy:  VTE prophylaxis Monitor platelets by anticoagulation protocol: Yes   Plan:  Lovenox '40mg'$  SQ q24hrs Monitor CBC, s/sx of bleeding   Hart Robinsons A 10/16/2016,1:29 PM

## 2016-10-16 NOTE — Progress Notes (Signed)
PROGRESS NOTE    Rebekah Henderson  YPP:[id number] DOB: 1956-05-02 DOA: 10/15/2016 PCP: Glo Herring., MD     Brief Narrative:  60 y/o woman admitted from home on 11/14 with fever, n/v and diarrhea. She has SCLC and is currently receiving treatment. Was found to be neutropenic.   Assessment & Plan:   Principal Problem:   Neutropenic fever ([place]) Active Problems:   Diarrhea   Hypertension   Fibromyalgia   IBS (irritable bowel syndrome)   Hypothyroidism   Small cell lung cancer, left (HCC)   Intractable nausea and vomiting   Pancytopenia due to antineoplastic chemotherapy (HCC)   Nodular rash   Hypokalemia   Hypomagnesemia   Chronic respiratory failure with hypoxia (HCC)    Neutropenic Fever -Has defervesced. -Per oncology, give neupogen daily until [place] >1000. -Cx remain negative to date. -continue levaquin.  Small Cell Lung Cancer -Continue OP follow up with oncology (Dr. Julien Nordmann)  Intractable N/VDiarrhea -Much improved symptomatically. -Suspect viral gastroenteritis. -C Diff pending.   DVT prophylaxis: lovenox Code Status: full code Family Communication: friend at bedside updated on plan of care and all questions answered Disposition Plan: likely DC home in 24-48 hours  Consultants:   None  Procedures:   None  Antimicrobials:   Levaquin    Subjective: Feels much better  Objective: Vitals:   10/15/16 1100 10/15/16 1535 10/15/16 2140 10/16/16 0518  BP: 141/86 (!) 148/85 132/73 123/74  Pulse:  (!) 103 99 80  Resp:   (!) 21 (!) 23  Temp:  98.1 F (36.7 C) 99.1 F (37.3 C) 97.6 F (36.4 C)  TempSrc:  Oral Oral Oral  SpO2:  94% 93% 96%  Weight:  84.8 kg (187 lb)  84.6 kg (186 lb 8 oz)  Height:  '5\' 4"'$  (1.626 m)      Intake/Output Summary (Last 24 hours) at 10/16/16 1208 Last data filed at 10/15/16 1800  Gross per 24 hour  Intake           233.33 ml  Output                0 ml  Net           233.33 ml   Filed Weights   10/15/16 0823  10/15/16 1535 10/16/16 0518  Weight: 84.4 kg (186 lb) 84.8 kg (187 lb) 84.6 kg (186 lb 8 oz)    Examination:  General exam: Alert, awake, oriented x 3 Respiratory system: Clear to auscultation. Respiratory effort normal. Cardiovascular system:RRR. No murmurs, rubs, gallops. Gastrointestinal system: Abdomen is nondistended, soft and nontender. No organomegaly or masses felt. Normal bowel sounds heard. Central nervous system: Alert and oriented. No focal neurological deficits. Extremities: No C/C/E, +pedal pulses Skin: No rashes, lesions or ulcers Psychiatry: Judgement and insight appear normal. Mood & affect appropriate.     Data Reviewed: I have personally reviewed following labs and imaging studies  CBC:  Recent Labs Lab 10/09/16 1414 10/15/16 0857 10/16/16 0610  WBC 4.6 1.1* 1.0*  NEUTROABS 2.8 0.2* 0.1*  HGB 14.5 12.3 11.0*  HCT 43.8 36.0 32.2*  MCV 86.2 85.3 86.6  PLT 224 56* 75*   Basic Metabolic Panel:  Recent Labs Lab 10/09/16 1415 10/15/16 0857 10/16/16 0610  NA 140 135 136  K 3.8 3.1* 4.1  CL  --  99* 106  CO2 '27 29 26  '$ GLUCOSE 90 126* 139*  BUN 11.7 5* 6  CREATININE 0.7 0.69 0.61  CALCIUM 9.5 8.5* 8.1*  MG 1.4* 1.6*  1.9   GFR: Estimated Creatinine Clearance: 76.8 mL/min (by C-G formula based on SCr of 0.61 mg/dL). Liver Function Tests:  Recent Labs Lab 10/09/16 1415 10/15/16 0857 10/16/16 0610  AST 19 15 12*  ALT 20 12* 11*  ALKPHOS 51 36* 29*  BILITOT 0.74 0.8 0.6  PROT 7.1 6.8 6.2*  ALBUMIN 3.5 3.5 3.1*    Recent Labs Lab 10/15/16 0857  LIPASE 15   No results for input(s): AMMONIA in the last 168 hours. Coagulation Profile: No results for input(s): INR, PROTIME in the last 168 hours. Cardiac Enzymes: No results for input(s): CKTOTAL, CKMB, CKMBINDEX, TROPONINI in the last 168 hours. BNP (last 3 results) No results for input(s): PROBNP in the last 8760 hours. HbA1C: No results for input(s): HGBA1C in the last 72  hours. CBG: No results for input(s): GLUCAP in the last 168 hours. Lipid Profile: No results for input(s): CHOL, HDL, LDLCALC, TRIG, CHOLHDL, LDLDIRECT in the last 72 hours. Thyroid Function Tests:  Recent Labs  10/15/16 0901  TSH 1.649   Anemia Panel: No results for input(s): VITAMINB12, FOLATE, FERRITIN, TIBC, IRON, RETICCTPCT in the last 72 hours. Urine analysis:    Component Value Date/Time   COLORURINE YELLOW 10/15/2016 [place] 10/15/2016 0927   LABSPEC <1.005 (L) 10/15/2016 0927   PHURINE 7.0 10/15/2016 0927   GLUCOSEU NEGATIVE 10/15/2016 0927   HGBUR NEGATIVE 10/15/2016 0927   BILIRUBINUR NEGATIVE 10/15/2016 0927   KETONESUR 15 (A) 10/15/2016 0927   PROTEINUR NEGATIVE 10/15/2016 0927   UROBILINOGEN 0.2 03/10/2014 1027   NITRITE NEGATIVE 10/15/2016 0927   LEUKOCYTESUR NEGATIVE 10/15/2016 0927   Sepsis Labs: '@LABRCNTIP'$ (procalcitonin:4,lacticidven:4)  ) Recent Results (from the past 240 hour(s))  Blood culture (routine x 2)     Status: None (Preliminary result)   Collection Time: 10/15/16 10:20 AM  Result Value Ref Range Status   Specimen Description BLOOD RIGHT ANTECUBITAL  Final   Special Requests   Final    BOTTLES DRAWN AEROBIC AND ANAEROBIC AEB=10CC ANA=7CC   Culture NO GROWTH < 24 HOURS  Final   Report Status PENDING  Incomplete  Blood culture (routine x 2)     Status: None (Preliminary result)   Collection Time: 10/15/16 10:24 AM  Result Value Ref Range Status   Specimen Description BLOOD LEFT ANTECUBITAL  Final   Special Requests   Final    BOTTLES DRAWN AEROBIC AND ANAEROBIC AEB=12CC ANA=6CC   Culture NO GROWTH < 24 HOURS  Final   Report Status PENDING  Incomplete         Radiology Studies: Dg Chest 2 View  Result Date: 10/15/2016 CLINICAL DATA:  Onset of fever with nausea vomiting and diarrhea last night. Patient is undergoing treatment for lung malignancy. Patient is a former smoker with history of COPD EXAM: CHEST  2 VIEW  COMPARISON:  PA and lateral chest x-ray of August 27, 2016 FINDINGS: The lungs are mildly hyperinflated. There is no focal infiltrate. There is persistent increased density at the right lung base which follows the contour of the hemidiaphragm. This is new since October 2016 but not significantly changed since August 27, 2016. Soft tissue fullness in the AP window region persists. There is faint calcification in the wall of the aortic arch. The heart and pulmonary vascularity are normal. The observed bony thorax exhibits no acute abnormality. The patient has undergone previous lower anterior cervical fusion. IMPRESSION: COPD. Persistent increased density at the right lung base. Persistent AP window lymphadenopathy. Chest CT scanning  is recommended for further evaluation of the mediastinum and right lower lung. Thoracic aortic atherosclerosis. Electronically Signed   By: David  Martinique M.D.   On: 10/15/2016 10:37   Dg Abd Portable 1v  Result Date: 10/16/2016 CLINICAL DATA:  Nausea and diarrhea EXAM: PORTABLE ABDOMEN - 1 VIEW COMPARISON:  PET-CT 09/27/2016 FINDINGS: No dilated loops of large or small bowel. Gas and stool in the rectum. No pathologic calcifications. No organomegaly. No acute osseous abnormality. Postcholecystectomy. IMPRESSION: No acute abdominal findings. Electronically Signed   By: Suzy Bouchard M.D.   On: 10/16/2016 07:44        Scheduled Meds: . amLODipine  5 mg Oral Daily  . aspirin EC  81 mg Oral QODAY  . estradiol  2 mg Oral Daily  . feeding supplement  1 Container Oral TID BM  . filgrastim (NEUPOGEN)  SQ  480 mcg Subcutaneous q1800  . FLUoxetine  40 mg Oral Daily  . levofloxacin (LEVAQUIN) IV  750 mg Intravenous Q24H  . levothyroxine  25 mcg Oral QAC breakfast  . metoprolol succinate  25 mg Oral Daily  . ondansetron (ZOFRAN) IV  4 mg Intravenous Q6H  . pantoprazole (PROTONIX) IV  40 mg Intravenous Daily  . temazepam  15 mg Oral QHS   Continuous Infusions: . 0.9  % NaCl with KCl 40 mEq / L 100 mL/hr (10/16/16 0426)     LOS: 1 day    Time spent: 25 minutes. Greater than 50% of this time was spent in direct contact with the patient coordinating care.     Lelon Frohlich, MD [facility] Pager [phone]  If 7PM-7AM, please contact night-coverage [email or web address] Password [email or web address] 10/16/2016, 12:08 PM

## 2016-10-17 LAB — CBC WITH DIFFERENTIAL/PLATELET
BASOS ABS: 0 10*3/uL (ref 0.0–0.1)
Basophils Relative: 1 %
Eosinophils Absolute: 0.1 10*3/uL (ref 0.0–0.7)
Eosinophils Relative: 3 %
HEMATOCRIT: 36.2 % (ref 36.0–46.0)
Hemoglobin: 12 g/dL (ref 12.0–15.0)
LYMPHS ABS: 1.5 10*3/uL (ref 0.7–4.0)
Lymphocytes Relative: 41 %
MCH: 29.2 pg (ref 26.0–34.0)
MCHC: 33.1 g/dL (ref 30.0–36.0)
MCV: 88.1 fL (ref 78.0–100.0)
MONOS PCT: 25 %
Monocytes Absolute: 0.9 10*3/uL (ref 0.1–1.0)
NEUTROS ABS: 1.1 10*3/uL — AB (ref 1.7–7.7)
Neutrophils Relative %: 30 %
Platelets: 133 10*3/uL — ABNORMAL LOW (ref 150–400)
RBC: 4.11 MIL/uL (ref 3.87–5.11)
RDW: 13.9 % (ref 11.5–15.5)
WBC: 3.6 10*3/uL — ABNORMAL LOW (ref 4.0–10.5)

## 2016-10-17 MED ORDER — LEVOFLOXACIN 750 MG PO TABS: 750.0000 mg | ORAL_TABLET | Freq: Every day | ORAL | 0 refills | Status: DC

## 2016-10-17 NOTE — Discharge Summary (Signed)
Physician Discharge Summary  Rebekah Henderson:[id number] DOB: 1956/06/04 DOA: 10/15/2016  PCP: Glo Herring., MD  Admit date: 10/15/2016 Discharge date: 10/17/2016  Time spent: 45 minutes  Recommendations for Outpatient Follow-up:  -Will be discharged home today. -Will take levaquin for 5 days. -Advised to follow up with oncologist as scheduled for next week.   Discharge Diagnoses:  Principal Problem:   Neutropenic fever ([place]) Active Problems:   Diarrhea   Hypertension   Fibromyalgia   IBS (irritable bowel syndrome)   Hypothyroidism   Small cell lung cancer, left (HCC)   Intractable nausea and vomiting   Pancytopenia due to antineoplastic chemotherapy (HCC)   Nodular rash   Hypokalemia   Hypomagnesemia   Chronic respiratory failure with hypoxia [facility])   Discharge Condition: Stable and improved  Filed Weights   10/15/16 1535 10/16/16 0518 10/17/16 0550  Weight: 84.8 kg (187 lb) 84.6 kg (186 lb 8 oz) 87 kg (191 lb 11.2 oz)    History of present illness:  As per Dr. Caryn Section on 11/14: Rebekah Henderson is a 60 y.o. 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 She received chemotherapy on 10/02/16. 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 Her symptoms started a couple days ago. She was prescribed Compazine and Zofran by her primary oncologist, but she was unable to keep it down. She denies associated abdominal pain, pain with urination, or coffee grounds emesis. She also complains of diarrhea worse then her "IBS diarrhea", having between 10 and 20 loose bowel movements over the last 2 days. She denies bright red blood per rectum or black tarry stools. She denies any recent antibiotics, recent travel, or sick contacts. She  developed fever and chills last night with a temperature of 100.8. She also complains of achiness all over, but it is difficult to tell whether or not it's from her fibromyalgia or something else. She has chronic chest congestion and a cough.   ED Course: In the ED, she was hemodynamically stable and borderline febrile but temperature of 99.6. Her blood pressure was within normal limits. Her lab data revealed WBC of 1.1 with an ANC of 0.2, platelet count of 56, hemoglobin of 12.3, initial normal lactic acid, potassium of 3.1, magnesium of 1.6, unremarkable urinalysis, and chest x-ray that reveals COPD and persistent increased density at the right lung base. She is being admitted for further evaluation and management of neutropenic fever and intractable nausea/vomiting/diarrhea.  Hospital Course:   Neutropenic Fever -Has defervesced. -Per oncology, give neupogen daily until [place] >1000. (ANC 1.1 on DC) -Cx remain negative to date. -continue levaquin for 5 more days on DC.  Small Cell Lung Cancer -Continue OP follow up with oncology (Dr. Julien Nordmann)  Intractable N/VDiarrhea -Much improved symptomatically. -Suspect viral gastroenteritis. -No stool since admission (unable to collect sample for c diff).  Procedures:  None   Consultations:  None  Discharge Instructions  Discharge Instructions    Diet - low sodium heart healthy    Complete by:  As directed    Increase activity slowly    Complete by:  As directed        Medication List    TAKE these medications   albuterol 2 MG tablet Commonly known as:  PROVENTIL Take 2 mg by  mouth daily as needed for wheezing or shortness of breath.   PROAIR HFA 108 (90 Base) MCG/ACT inhaler Generic drug:  albuterol Inhale 1-2 puffs into the lungs every 4 (four) hours as needed.   amLODipine 5 MG tablet Commonly known as:  NORVASC Take 5 mg by mouth daily.   aspirin EC 81 MG tablet Take 81 mg by mouth every other day.   estazolam 2 MG  tablet Commonly known as:  PROSOM Take 2 mg by mouth at bedtime.   estradiol 2 MG tablet Commonly known as:  ESTRACE Take 2 mg by mouth daily.   FLUoxetine 40 MG capsule Commonly known as:  PROZAC Take 40 mg by mouth daily.   HYDROcodone-acetaminophen 5-325 MG tablet Commonly known as:  NORCO/VICODIN Take 1 tablet by mouth every 6 (six) hours as needed for pain.   levofloxacin 750 MG tablet Commonly known as:  LEVAQUIN Take 1 tablet (750 mg total) by mouth daily.   levothyroxine 25 MCG tablet Commonly known as:  SYNTHROID, LEVOTHROID Take 25 mcg by mouth daily.   magnesium oxide 400 (241.3 Mg) MG tablet Commonly known as:  MAG-OX Take 1 tablet (400 mg total) by mouth 3 (three) times daily.   metoprolol succinate 25 MG 24 hr tablet Commonly known as:  TOPROL-XL Take 25 mg by mouth daily.   ondansetron 8 MG tablet Commonly known as:  ZOFRAN Take 1 tablet (8 mg total) by mouth 3 (three) times daily as needed for nausea or vomiting.   pantoprazole 40 MG tablet Commonly known as:  PROTONIX Take 40 mg by mouth 2 (two) times daily.   prochlorperazine 10 MG tablet Commonly known as:  COMPAZINE Take 1 tablet (10 mg total) by mouth every 6 (six) hours as needed for nausea or vomiting.   triamterene-hydrochlorothiazide 37.5-25 MG capsule Commonly known as:  DYAZIDE Take 1 capsule by mouth every morning.      Allergies  Allergen Reactions  . Penicillins Hives and Swelling    Has patient had a PCN reaction causing immediate rash, facial/tongue/throat swelling, SOB or lightheadedness with hypotension:unsure  Has patient had a PCN reaction causing severe rash involving mucus membranes or skin necrosis:unsure Has patient had a PCN reaction that required hospitalization:No Has patient had a PCN reaction occurring within the last 10 years:No If all of the above answers are "NO", then may proceed with Cephalosporin use.   . Codeine Nausea Only  . Ranitidine Hcl Other (See  Comments)    dizzy  . Keflex [Cephalexin] Other (See Comments)    UNSPECIFIED REACTION   . Lyrica [Pregabalin] Other (See Comments)    lethargic   Follow-up Information    Eilleen Kempf., MD. Schedule an appointment as soon as possible for a visit.   Specialty:  Oncology Contact information: [address] [place] [address] [phone]            The results of significant diagnostics from this hospitalization (including imaging, microbiology, ancillary and laboratory) are listed below for reference.    Significant Diagnostic Studies: Dg Chest 2 View  Result Date: 10/15/2016 CLINICAL DATA:  Onset of fever with nausea vomiting and diarrhea last night. Patient is undergoing treatment for lung malignancy. Patient is a former smoker with history of COPD EXAM: CHEST  2 VIEW COMPARISON:  PA and lateral chest x-ray of August 27, 2016 FINDINGS: The lungs are mildly hyperinflated. There is no focal infiltrate. There is persistent increased density at the right lung base which follows the contour  of the hemidiaphragm. This is new since October 2016 but not significantly changed since August 27, 2016. Soft tissue fullness in the AP window region persists. There is faint calcification in the wall of the aortic arch. The heart and pulmonary vascularity are normal. The observed bony thorax exhibits no acute abnormality. The patient has undergone previous lower anterior cervical fusion. IMPRESSION: COPD. Persistent increased density at the right lung base. Persistent AP window lymphadenopathy. Chest CT scanning is recommended for further evaluation of the mediastinum and right lower lung. Thoracic aortic atherosclerosis. Electronically Signed   By: David  Martinique M.D.   On: 10/15/2016 10:37   Mr Jeri Cos PF Contrast  Result Date: 09/27/2016 CLINICAL DATA:  Headache and nausea. History of small cell lung cancer. EXAM: MRI HEAD WITHOUT AND WITH CONTRAST TECHNIQUE: Multiplanar,  multiecho pulse sequences of the brain and surrounding structures were obtained without and with intravenous contrast. CONTRAST:  19m MULTIHANCE GADOBENATE DIMEGLUMINE 529 MG/ML IV SOLN COMPARISON:  Brain MRI 12/09/2007 FINDINGS: Brain: No acute infarct or intraparenchymal hemorrhage. The midline structures are normal. Mild hyperintense T2 weighted signal within the pons and periventricular white matter. No mass lesion or midline shift. No hydrocephalus or extra-axial fluid collection. No parenchymal contrast enhancement. Vascular: Major intracranial arterial and venous sinus flow voids are preserved. No evidence of chronic microhemorrhage or amyloid angiopathy. Skull and upper cervical spine: The visualized skull base, calvarium, upper cervical spine and extracranial soft tissues are normal. Sinuses/Orbits: No fluid levels or advanced mucosal thickening. No mastoid effusion. Normal orbits. IMPRESSION: 1. No acute intracranial abnormality. No intracranial metastatic disease. 2. Findings suggesting mild early chronic microvascular disease. Electronically Signed   By: KUlyses JarredM.D.   On: 09/27/2016 17:01   Nm Pet Image Initial (pi) Skull Base To Thigh  Result Date: 09/27/2016 CLINICAL DATA:  Initial treatment strategy for left lung cancer. EXAM: NUCLEAR MEDICINE PET SKULL BASE TO THIGH TECHNIQUE: 9.1 mCi F-18 FDG was injected intravenously. Full-ring PET imaging was performed from the skull base to thigh after the radiotracer. CT data was obtained and used for attenuation correction and anatomic localization. FASTING BLOOD GLUCOSE:  Value: 88 mg/dl COMPARISON:  CT chest dated 09/02/2016 FINDINGS: NECK No hypermetabolic lymph nodes in the neck. CHEST 3.3 x 1.7 cm mass in the medial left lower lobe adjacent to the descending thoracic aorta (series 4/image 70), max SUV 9.9, corresponding to suspected primary bronchogenic neoplasm. Associated thoracic lymphadenopathy, including: --5.4 x 7.0 cm AP window node  (series 4/image 39), max SUV 15.8 --1.7 cm left infrahilar/posterior pericardial node (series 4/ image 73), max SUV 9.9 --Additional left hilar lymphadenopathy (series 4/ image 67), max SUV 9.3 Small bilateral axillary nodes, max SUV 2.4, likely reactive. ABDOMEN/PELVIS No abnormal hypermetabolic activity within the liver, pancreas, adrenal glands, or spleen. Status post cholecystectomy. Atherosclerotic calcifications the abdominal aorta and branch vessels. Sigmoid diverticulosis, without evidence of diverticulitis. No hypermetabolic lymph nodes in the abdomen or pelvis. SKELETON No focal hypermetabolic activity to suggest skeletal metastasis. IMPRESSION: 3.3 cm mass in the medial left lower lobe adjacent to the descending thoracic aorta, corresponding to suspected primary bronchogenic neoplasm. Associated mediastinal and left hilar nodal metastases. Electronically Signed   By: SJulian HyM.D.   On: 09/27/2016 17:15   Dg Abd Portable 1v  Result Date: 10/16/2016 CLINICAL DATA:  Nausea and diarrhea EXAM: PORTABLE ABDOMEN - 1 VIEW COMPARISON:  PET-CT 09/27/2016 FINDINGS: No dilated loops of large or small bowel. Gas and stool in the rectum. No pathologic calcifications.  No organomegaly. No acute osseous abnormality. Postcholecystectomy. IMPRESSION: No acute abdominal findings. Electronically Signed   By: Suzy Bouchard M.D.   On: 10/16/2016 07:44    Microbiology: Recent Results (from the past 240 hour(s))  Blood culture (routine x 2)     Status: None (Preliminary result)   Collection Time: 10/15/16 10:20 AM  Result Value Ref Range Status   Specimen Description BLOOD RIGHT ANTECUBITAL  Final   Special Requests   Final    BOTTLES DRAWN AEROBIC AND ANAEROBIC AEB=10CC ANA=7CC   Culture NO GROWTH 2 DAYS  Final   Report Status PENDING  Incomplete  Blood culture (routine x 2)     Status: None (Preliminary result)   Collection Time: 10/15/16 10:24 AM  Result Value Ref Range Status   Specimen  Description BLOOD LEFT ANTECUBITAL  Final   Special Requests   Final    BOTTLES DRAWN AEROBIC AND ANAEROBIC AEB=12CC ANA=6CC   Culture NO GROWTH 2 DAYS  Final   Report Status PENDING  Incomplete     Labs: Basic Metabolic Panel:  Recent Labs Lab 10/15/16 0857 10/16/16 0610  NA 135 136  K 3.1* 4.1  CL 99* 106  CO2 29 26  GLUCOSE 126* 139*  BUN 5* 6  CREATININE 0.69 0.61  CALCIUM 8.5* 8.1*  MG 1.6* 1.9   Liver Function Tests:  Recent Labs Lab 10/15/16 0857 10/16/16 0610  AST 15 12*  ALT 12* 11*  ALKPHOS 36* 29*  BILITOT 0.8 0.6  PROT 6.8 6.2*  ALBUMIN 3.5 3.1*    Recent Labs Lab 10/15/16 0857  LIPASE 15   No results for input(s): AMMONIA in the last 168 hours. CBC:  Recent Labs Lab 10/15/16 0857 10/16/16 0610 10/17/16 0608  WBC 1.1* 1.0* 3.6*  NEUTROABS 0.2* 0.1* 1.1*  HGB 12.3 11.0* 12.0  HCT 36.0 32.2* 36.2  MCV 85.3 86.6 88.1  PLT 56* 75* 133*   Cardiac Enzymes: No results for input(s): CKTOTAL, CKMB, CKMBINDEX, TROPONINI in the last 168 hours. BNP: BNP (last 3 results)  Recent Labs  11/09/15 1212 11/11/15 1045  BNP CANCELED 13.1    ProBNP (last 3 results) No results for input(s): PROBNP in the last 8760 hours.  CBG: No results for input(s): GLUCAP in the last 168 hours.     SignedLelon Frohlich  [facility] Pager: [phone] 10/17/2016, 10:13 AM

## 2016-10-17 NOTE — Progress Notes (Signed)
Pt has orders for discharge. Pt states that her pain is still at 10/10 upon discharge. IVs removed. Medication regimen and follow-up appointments explained to patient and pt verbalized understanding of importance. Pt has no further questions at this time and is awaiting transportation.   Celestia Khat, RN

## 2016-10-19 ENCOUNTER — Encounter (HOSPITAL_COMMUNITY): Payer: Self-pay

## 2016-10-19 ENCOUNTER — Emergency Department (HOSPITAL_COMMUNITY)
Admission: EM | Admit: 2016-10-19 | Discharge: 2016-10-19 | Disposition: A | Discharge status: Home / Self Care | Payer: Medicare Other | Attending: Emergency Medicine | Admitting: Emergency Medicine

## 2016-10-19 ENCOUNTER — Emergency Department (HOSPITAL_COMMUNITY): Payer: Medicare Other

## 2016-10-19 DIAGNOSIS — Z85118 Personal history of other malignant neoplasm of bronchus and lung: Secondary | ICD-10-CM | POA: Diagnosis not present

## 2016-10-19 DIAGNOSIS — R21 Rash and other nonspecific skin eruption: Secondary | ICD-10-CM | POA: Insufficient documentation

## 2016-10-19 DIAGNOSIS — Z79899 Other long term (current) drug therapy: Secondary | ICD-10-CM | POA: Diagnosis not present

## 2016-10-19 DIAGNOSIS — Z85828 Personal history of other malignant neoplasm of skin: Secondary | ICD-10-CM | POA: Insufficient documentation

## 2016-10-19 DIAGNOSIS — Z7982 Long term (current) use of aspirin: Secondary | ICD-10-CM | POA: Insufficient documentation

## 2016-10-19 DIAGNOSIS — R079 Chest pain, unspecified: Secondary | ICD-10-CM | POA: Diagnosis not present

## 2016-10-19 DIAGNOSIS — I10 Essential (primary) hypertension: Secondary | ICD-10-CM | POA: Insufficient documentation

## 2016-10-19 DIAGNOSIS — R110 Nausea: Secondary | ICD-10-CM | POA: Diagnosis not present

## 2016-10-19 DIAGNOSIS — R0602 Shortness of breath: Secondary | ICD-10-CM

## 2016-10-19 DIAGNOSIS — J449 Chronic obstructive pulmonary disease, unspecified: Secondary | ICD-10-CM | POA: Diagnosis not present

## 2016-10-19 DIAGNOSIS — Z87891 Personal history of nicotine dependence: Secondary | ICD-10-CM | POA: Insufficient documentation

## 2016-10-19 DIAGNOSIS — E039 Hypothyroidism, unspecified: Secondary | ICD-10-CM | POA: Diagnosis not present

## 2016-10-19 LAB — CBC WITH DIFFERENTIAL/PLATELET
BASOS ABS: 0.1 10*3/uL (ref 0.0–0.1)
Basophils Relative: 1 %
Eosinophils Absolute: 0.1 10*3/uL (ref 0.0–0.7)
Eosinophils Relative: 1 %
HCT: 34.3 % — ABNORMAL LOW (ref 36.0–46.0)
Hemoglobin: 11.7 g/dL — ABNORMAL LOW (ref 12.0–15.0)
LYMPHS ABS: 2 10*3/uL (ref 0.7–4.0)
Lymphocytes Relative: 15 %
MCH: 29.3 pg (ref 26.0–34.0)
MCHC: 34.1 g/dL (ref 30.0–36.0)
MCV: 86 fL (ref 78.0–100.0)
MONO ABS: 2.6 10*3/uL — AB (ref 0.1–1.0)
Monocytes Relative: 20 %
Neutro Abs: 8.2 10*3/uL — ABNORMAL HIGH (ref 1.7–7.7)
Neutrophils Relative %: 63 %
PLATELETS: 236 10*3/uL (ref 150–400)
RBC: 3.99 MIL/uL (ref 3.87–5.11)
RDW: 13.9 % (ref 11.5–15.5)
WBC: 13 10*3/uL — AB (ref 4.0–10.5)

## 2016-10-19 LAB — COMPREHENSIVE METABOLIC PANEL
ALBUMIN: 3.5 g/dL (ref 3.5–5.0)
ALT: 10 U/L — ABNORMAL LOW (ref 14–54)
AST: 13 U/L — AB (ref 15–41)
Alkaline Phosphatase: 50 U/L (ref 38–126)
Anion gap: 11 (ref 5–15)
BUN: 7 mg/dL (ref 6–20)
CHLORIDE: 98 mmol/L — AB (ref 101–111)
CO2: 28 mmol/L (ref 22–32)
Calcium: 8.9 mg/dL (ref 8.9–10.3)
Creatinine, Ser: 0.79 mg/dL (ref 0.44–1.00)
GFR calc Af Amer: >60 mL/min (ref >60)
GFR calc non Af Amer: >60 mL/min (ref >60)
GLUCOSE: 96 mg/dL (ref 65–99)
POTASSIUM: 3.4 mmol/L — AB (ref 3.5–5.1)
Sodium: 137 mmol/L (ref 135–145)
Total Bilirubin: 0.9 mg/dL (ref 0.3–1.2)
Total Protein: 7 g/dL (ref 6.5–8.1)

## 2016-10-19 LAB — URINALYSIS, ROUTINE W REFLEX MICROSCOPIC
GLUCOSE, UA: NEGATIVE mg/dL
Hgb urine dipstick: NEGATIVE
Ketones, ur: 40 mg/dL — AB
LEUKOCYTES UA: NEGATIVE
Nitrite: NEGATIVE
PH: 6.5 (ref 5.0–8.0)
PROTEIN: NEGATIVE mg/dL
Specific Gravity, Urine: 1.022 (ref 1.005–1.030)

## 2016-10-19 LAB — I-STAT CG4 LACTIC ACID, ED: LACTIC ACID, VENOUS: 1.61 mmol/L (ref 0.5–1.9)

## 2016-10-19 MED ORDER — SODIUM CHLORIDE 0.9 % IV BOLUS (SEPSIS)
2000.0000 mL | Freq: Once | INTRAVENOUS | Status: AC
  Administered 2016-10-19: 2000 mL via INTRAVENOUS

## 2016-10-19 MED ORDER — OXYCODONE-ACETAMINOPHEN 5-325 MG PO TABS: 1.0000 | ORAL_TABLET | ORAL | 0 refills | Status: DC | PRN

## 2016-10-19 MED ORDER — HYDROMORPHONE HCL 1 MG/ML IJ SOLN
1.0000 mg | Freq: Once | INTRAMUSCULAR | Status: AC
  Administered 2016-10-19: 1 mg via INTRAVENOUS
  Filled 2016-10-19: qty 1

## 2016-10-19 MED ORDER — SODIUM CHLORIDE 0.9 % IV SOLN: INTRAVENOUS | Status: DC

## 2016-10-19 MED ORDER — ONDANSETRON HCL 4 MG/2ML IJ SOLN
4.0000 mg | Freq: Once | INTRAMUSCULAR | Status: AC
  Administered 2016-10-19: 4 mg via INTRAVENOUS
  Filled 2016-10-19: qty 2

## 2016-10-19 NOTE — ED Triage Notes (Signed)
Pt presents with c/o nausea, vomiting, and blisters on her legs. Pt is currently a cancer patient, last radiation treatment on Monday. Pt was recently admitted [facility] and discharged Thursday. Pt reports she is still having nausea and is concerned about the boils on her legs. Pt also reports vomiting.

## 2016-10-19 NOTE — ED Provider Notes (Signed)
[facility] DEPT Provider Note   CSN: [id number] Arrival date & time: 10/19/16  1204     History   Chief Complaint Chief Complaint  Patient presents with  . Nausea    HPI Rebekah Henderson is a 60 y.o. female.  60 year old female presents with nausea since being discharged 2 days ago from the hospital. She is admitted for neutropenic fever and have been sent home on Levaquin. She's been home, she's had anorexia without vomiting. Some subjective myalgias. Slight cough is been nonproductive. She has not been compliant with her Levaquin. She also notes this nodular rash which is been present for several days and was patient during her hospitalization but no follow-up was given. States that it is slightly itchy but notes that it is very painful. The rash is located on her upper and lower extremities. No prior history of same of this. Denies any abdominal discomfort. No black or bloody stools. No urinary symptoms.      Past Medical History:  Diagnosis Date  . Anxiety    takes Prozac daily  . Arthritis   . Arthritis   . Cancer ([place])    skin, lung  . Colon polyps   . COPD (chronic obstructive pulmonary disease) ([place])   . Diverticulitis   . Dyspnea    with exertion  . Fibromyalgia   . GERD (gastroesophageal reflux disease)    takes Pantoprazole daily  . Hypertension    takes Metoprolol,Triamterene-HCTZ,and Amlodipine daily  . Hypothyroidism    takes Synthroid daily  . IBS (irritable bowel syndrome)   . PONV (postoperative nausea and vomiting)    pt also states that she had some difficulty breathing after cervical fusion    Patient Active Problem List   Diagnosis Date Noted  . Neutropenic fever ([place]) 10/15/2016  . Intractable nausea and vomiting 10/15/2016  . Pancytopenia due to antineoplastic chemotherapy ([place]) 10/15/2016  . Nodular rash 10/15/2016  . Hypokalemia 10/15/2016  . Hypomagnesemia 10/15/2016  . Chronic respiratory failure with hypoxia ([place]) 10/15/2016  .  Small cell lung cancer, left ([place]) 09/19/2016  . Mediastinal mass 09/10/2016  . COPD (chronic obstructive pulmonary disease) ([place])   . Hypertension   . Fibromyalgia   . IBS (irritable bowel syndrome)   . GERD (gastroesophageal reflux disease)   . Hypothyroidism   . Colon polyps   . Anxiety   . Arthritis   . PONV (postoperative nausea and vomiting)   . Cancer ([place])   . Diverticulitis   . Abdominal pain, other specified site 05/12/2013  . Diarrhea 05/12/2013    Past Surgical History:  Procedure Laterality Date  . BIOPSY N/A 05/25/2013   Procedure: BIOPSIES (Random Colon; Duodenal; Gastric);  Surgeon: Danie Binder, MD;  Location: [facility];  Service: Endoscopy;  Laterality: N/A;  . BLADDER SUSPENSION    . BREAST ENHANCEMENT SURGERY    . BREAST IMPLANT REMOVAL    . CERVICAL FUSION  AUG 2013  . CHOLECYSTECTOMY  1999  . COLONOSCOPY  2007 [place]   POLYPS  . COLONOSCOPY WITH PROPOFOL N/A 05/25/2013   Procedure: COLONOSCOPY WITH PROPOFOL(at cecum 0957) total withdrawal time=58mn);  Surgeon: SDanie Binder MD;  Location: [facility];  Service: Endoscopy;  Laterality: N/A;  . ESOPHAGOGASTRODUODENOSCOPY (EGD) WITH PROPOFOL N/A 05/25/2013   Procedure: ESOPHAGOGASTRODUODENOSCOPY (EGD) WITH PROPOFOL;  Surgeon: SDanie Binder MD;  Location: [facility];  Service: Endoscopy;  Laterality: N/A;  . FOOT SURGERY    . POLYPECTOMY N/A 05/25/2013   Procedure: POLYPECTOMY (Rectal and  Gastric);  Surgeon: Danie Binder, MD;  Location: [facility];  Service: Endoscopy;  Laterality: N/A;  . TONSILLECTOMY    . UPPER GASTROINTESTINAL ENDOSCOPY    . VIDEO BRONCHOSCOPY WITH ENDOBRONCHIAL ULTRASOUND  09/12/2016   Procedure: VIDEO BRONCHOSCOPY WITH ENDOBRONCHIAL ULTRASOUND AND BIOPSY;  Surgeon: Juanito Doom, MD;  Location: [facility] OR;  Service: Cardiopulmonary;;    OB History    No data available       Home Medications    Prior to Admission medications   Medication Sig Start Date End Date Taking? Authorizing  Provider  albuterol (PROVENTIL) 2 MG tablet Take 2 mg by mouth daily as needed for wheezing or shortness of breath.    Yes Historical Provider, MD  amLODipine (NORVASC) 5 MG tablet Take 5 mg by mouth daily.     Yes Historical Provider, MD  aspirin EC 81 MG tablet Take 81 mg by mouth every other day.    Yes Historical Provider, MD  estazolam (PROSOM) 2 MG tablet Take 2 mg by mouth at bedtime.   Yes Historical Provider, MD  estradiol (ESTRACE) 2 MG tablet Take 2 mg by mouth daily.     Yes Historical Provider, MD  FLUoxetine (PROZAC) 40 MG capsule Take 40 mg by mouth daily.  06/03/15  Yes Historical Provider, MD  HYDROcodone-acetaminophen (NORCO/VICODIN) 5-325 MG per tablet Take 1 tablet by mouth every 6 (six) hours as needed for pain.  03/29/13  Yes Historical Provider, MD  levothyroxine (SYNTHROID, LEVOTHROID) 25 MCG tablet Take 25 mcg by mouth daily.     Yes Historical Provider, MD  magnesium oxide (MAG-OX) 400 (241.3 Mg) MG tablet Take 1 tablet (400 mg total) by mouth 3 (three) times daily. 10/14/16  Yes Curt Bears, MD  metoprolol succinate (TOPROL-XL) 25 MG 24 hr tablet Take 25 mg by mouth daily.   Yes Historical Provider, MD  Multiple Vitamin (MULTIVITAMIN WITH MINERALS) TABS tablet Take 2 tablets by mouth daily.   Yes Historical Provider, MD  ondansetron (ZOFRAN) 8 MG tablet Take 1 tablet (8 mg total) by mouth 3 (three) times daily as needed for nausea or vomiting. 10/08/16  Yes Curt Bears, MD  pantoprazole (PROTONIX) 40 MG tablet Take 40 mg by mouth 2 (two) times daily.    Yes Historical Provider, MD  PROAIR HFA 108 (90 BASE) MCG/ACT inhaler Inhale 1-2 puffs into the lungs every 4 (four) hours as needed.  05/29/15  Yes Historical Provider, MD  prochlorperazine (COMPAZINE) 10 MG tablet Take 1 tablet (10 mg total) by mouth every 6 (six) hours as needed for nausea or vomiting. 09/19/16  Yes Curt Bears, MD  triamterene-hydrochlorothiazide (DYAZIDE) 37.5-25 MG per capsule Take 1 capsule by  mouth every morning.     Yes Historical Provider, MD  levofloxacin (LEVAQUIN) 750 MG tablet Take 1 tablet (750 mg total) by mouth daily. 10/17/16   Estela Leonie Green, MD    Family History Family History  Problem Relation Age of Onset  . Breast cancer Mother   . Diabetes Maternal Grandfather   . Lung cancer Father   . Heart failure Sister 75    Died. Morbidly obese  . Colon cancer Neg Hx   . Colon polyps Neg Hx     Social History Social History  Substance Use Topics  . Smoking status: Former Smoker    Packs/day: 2.00    Years: 20.00    Quit date: 05/20/2004  . Smokeless tobacco: Never Used  . Alcohol use No  Allergies   Penicillins; Codeine; Ranitidine hcl; Keflex [cephalexin]; and Lyrica [pregabalin]   Review of Systems Review of Systems  All other systems reviewed and are negative.    Physical Exam Updated Vital Signs BP 139/68 (BP Location: Right Arm)   Pulse 81   Temp 98.3 F (36.8 C) (Oral)   Resp 16   SpO2 (!) 81%   Physical Exam  Constitutional: She is oriented to person, place, and time. She appears well-developed and well-nourished.  Non-toxic appearance. No distress.  HENT:  Head: Normocephalic and atraumatic.  Eyes: Conjunctivae, EOM and lids are normal. Pupils are equal, round, and reactive to light.  Neck: Normal range of motion. Neck supple. No tracheal deviation present. No thyroid mass present.  Cardiovascular: Normal rate, regular rhythm and normal heart sounds.  Exam reveals no gallop.   No murmur heard. Pulmonary/Chest: Effort normal and breath sounds normal. No stridor. No respiratory distress. She has no decreased breath sounds. She has no wheezes. She has no rhonchi. She has no rales.  Abdominal: Soft. Normal appearance and bowel sounds are normal. She exhibits no distension. There is no tenderness. There is no rebound and no CVA tenderness.  Musculoskeletal: Normal range of motion. She exhibits no edema or tenderness.    Neurological: She is alert and oriented to person, place, and time. She has normal strength. No cranial nerve deficit or sensory deficit. GCS eye subscore is 4. GCS verbal subscore is 5. GCS motor subscore is 6.  Skin: Skin is warm and dry. Rash noted. No abrasion noted.  Diffuse nodular rash with erythematous border more so on her lower extremities. No drainage noted. No pustules noted.  Psychiatric: She has a normal mood and affect. Her speech is normal and behavior is normal.  Nursing note and vitals reviewed.    ED Treatments / Results  Labs (all labs ordered are listed, but only abnormal results are displayed) Labs Reviewed  CBC WITH DIFFERENTIAL/PLATELET - Abnormal; Notable for the following:       Result Value   WBC 13.0 (*)    Hemoglobin 11.7 (*)    HCT 34.3 (*)    All other components within normal limits  COMPREHENSIVE METABOLIC PANEL - Abnormal; Notable for the following:    Potassium 3.4 (*)    Chloride 98 (*)    AST 13 (*)    ALT 10 (*)    All other components within normal limits  URINALYSIS, ROUTINE W REFLEX MICROSCOPIC (NOT AT [facility]) - Abnormal; Notable for the following:    Color, Urine AMBER (*)    APPearance CLOUDY (*)    Bilirubin Urine MODERATE (*)    Ketones, ur 40 (*)    All other components within normal limits  CULTURE, BLOOD (ROUTINE X 2)  CULTURE, BLOOD (ROUTINE X 2)  URINE CULTURE  I-STAT CG4 LACTIC ACID, ED    EKG  EKG Interpretation None       Radiology Dg Chest 2 View  Result Date: 10/19/2016 CLINICAL DATA:  Shortness of breath, chest pain EXAM: CHEST  2 VIEW COMPARISON:  10/15/2016 FINDINGS: Mild peribronchial thickening. Heart and mediastinal contours are within normal limits. No focal opacities or effusions. No acute bony abnormality. IMPRESSION: Mild bronchitic changes. Electronically Signed   By: Rolm Baptise M.D.   On: 10/19/2016 13:21    Procedures Procedures (including critical care time)  Medications Ordered in  ED Medications  0.9 %  sodium chloride infusion (not administered)  sodium chloride 0.9 % bolus 2,000 mL (  2,000 mLs Intravenous New Bag/Given 10/19/16 1346)  HYDROmorphone (DILAUDID) injection 1 mg (1 mg Intravenous Given 10/19/16 1343)  ondansetron (ZOFRAN) injection 4 mg (4 mg Intravenous Given 10/19/16 1343)     Initial Impression / Assessment and Plan / ED Course  I have reviewed the triage vital signs and the nursing notes.  Pertinent labs & imaging results that were available during my care of the patient were reviewed by me and considered in my medical decision making (see chart for details).  Clinical Course     Patient given IV hydration here as well as medication for pain and nausea. Her urinalysis does show some evidence of dehydration. She feels much better at this time. Patient's pulse ox room air noted but she is chronically on oxygen at home. This was taken without her oxygen on. Patient to follow-up in the cancer Center 48 hours. Return precautions given  Final Clinical Impressions(s) / ED Diagnoses   Final diagnoses:  SOB (shortness of breath)    New Prescriptions New Prescriptions   No medications on file     Lacretia Leigh, MD 10/19/16 1454

## 2016-10-19 NOTE — ED Notes (Signed)
Patient comes from home with c/o nausea and blisters on arms/leg following recent hospitalization (d/c 2 days ago).  Patient denies abdominal pain, but also c/o n/v.    Patient has a number of areas on arms and legs that appear to be raw skin, but cannot be described as blisters.  Patient is A&O and ambulatory.  She wears O2 at home at night and her sats here in ED have been in mid to upper 80's on RA.  Patient placed on 2L O2 [place].

## 2016-10-19 NOTE — Discharge Instructions (Signed)
Follow-up the cancer Center on Monday. Return here at once for fever above 101, worsening rash, or any other problems

## 2016-10-20 ENCOUNTER — Telehealth: Payer: Self-pay | Admitting: *Deleted

## 2016-10-20 LAB — CULTURE, BLOOD (ROUTINE X 2)
Culture: NO GROWTH
Culture: NO GROWTH

## 2016-10-20 NOTE — Telephone Encounter (Signed)
Clarified that Vicodin listed on Home Medication list. Pharmacist is going to council pt on use of Narcotics and overuse of Narcotics. I made her aware that pt was recently diagnosed with SCLC and is receiving Chemo. No further CM needs at this time.

## 2016-10-21 ENCOUNTER — Ambulatory Visit (HOSPITAL_BASED_OUTPATIENT_CLINIC_OR_DEPARTMENT_OTHER): Payer: Medicare Other

## 2016-10-21 ENCOUNTER — Telehealth: Payer: Self-pay | Admitting: Internal Medicine

## 2016-10-21 ENCOUNTER — Ambulatory Visit (HOSPITAL_BASED_OUTPATIENT_CLINIC_OR_DEPARTMENT_OTHER): Payer: Medicare Other | Admitting: Oncology

## 2016-10-21 ENCOUNTER — Other Ambulatory Visit (HOSPITAL_BASED_OUTPATIENT_CLINIC_OR_DEPARTMENT_OTHER): Payer: Medicare Other

## 2016-10-21 ENCOUNTER — Encounter: Payer: Self-pay | Admitting: Oncology

## 2016-10-21 VITALS — BP 124/80 | HR 118 | Temp 97.7°F | Resp 14 | Ht 64.0 in | Wt 182.7 lb

## 2016-10-21 VITALS — BP 140/74 | HR 120 | Temp 98.3°F | Resp 20

## 2016-10-21 DIAGNOSIS — R21 Rash and other nonspecific skin eruption: Secondary | ICD-10-CM | POA: Diagnosis not present

## 2016-10-21 DIAGNOSIS — C3432 Malignant neoplasm of lower lobe, left bronchus or lung: Secondary | ICD-10-CM

## 2016-10-21 DIAGNOSIS — R079 Chest pain, unspecified: Secondary | ICD-10-CM | POA: Diagnosis not present

## 2016-10-21 DIAGNOSIS — R000 Tachycardia, unspecified: Secondary | ICD-10-CM | POA: Diagnosis not present

## 2016-10-21 DIAGNOSIS — E876 Hypokalemia: Secondary | ICD-10-CM

## 2016-10-21 DIAGNOSIS — C3492 Malignant neoplasm of unspecified part of left bronchus or lung: Secondary | ICD-10-CM

## 2016-10-21 LAB — COMPREHENSIVE METABOLIC PANEL
ALBUMIN: 2.8 g/dL — AB (ref 3.5–5.0)
ALK PHOS: 66 U/L (ref 40–150)
ALT: 6 U/L (ref 0–55)
AST: 9 U/L (ref 5–34)
Anion Gap: 13 mEq/L — ABNORMAL HIGH (ref 3–11)
BILIRUBIN TOTAL: 0.48 mg/dL (ref 0.20–1.20)
BUN: 5.6 mg/dL — AB (ref 7.0–26.0)
CO2: 22 meq/L (ref 22–29)
CREATININE: 0.7 mg/dL (ref 0.6–1.1)
Calcium: 9.4 mg/dL (ref 8.4–10.4)
Chloride: 101 mEq/L (ref 98–109)
EGFR: >90 mL/min/{1.73_m2} (ref >90)
GLUCOSE: 125 mg/dL (ref 70–140)
Potassium: 3.1 mEq/L — ABNORMAL LOW (ref 3.5–5.1)
SODIUM: 136 meq/L (ref 136–145)
TOTAL PROTEIN: 7.2 g/dL (ref 6.4–8.3)

## 2016-10-21 LAB — CBC WITH DIFFERENTIAL/PLATELET
BASO%: 0.4 % (ref 0.0–2.0)
BASOS ABS: 0.1 10*3/uL (ref 0.0–0.1)
EOS ABS: 0.1 10*3/uL (ref 0.0–0.5)
EOS%: 0.4 % (ref 0.0–7.0)
HCT: 35.9 % (ref 34.8–46.6)
HEMOGLOBIN: 12.6 g/dL (ref 11.6–15.9)
LYMPH#: 1.2 10*3/uL (ref 0.9–3.3)
LYMPH%: 8.4 % — ABNORMAL LOW (ref 14.0–49.7)
MCH: 28.9 pg (ref 25.1–34.0)
MCHC: 35.1 g/dL (ref 31.5–36.0)
MCV: 82.3 fL (ref 79.5–101.0)
MONO#: 3.3 10*3/uL — ABNORMAL HIGH (ref 0.1–0.9)
MONO%: 23.6 % — AB (ref 0.0–14.0)
NEUT%: 67.2 % (ref 38.4–76.8)
NEUTROS ABS: 9.6 10*3/uL — AB (ref 1.5–6.5)
NRBC: 0 % (ref 0–0)
Platelets: 201 10*3/uL (ref 145–400)
RBC: 4.36 10*6/uL (ref 3.70–5.45)
RDW: 13.8 % (ref 11.2–14.5)
WBC: 14.2 10*3/uL — AB (ref 3.9–10.3)

## 2016-10-21 LAB — URINE CULTURE

## 2016-10-21 LAB — MAGNESIUM: Magnesium: 1.4 mg/dl — CL (ref 1.5–2.5)

## 2016-10-21 MED ORDER — METHYLPREDNISOLONE 4 MG PO TABS: ORAL_TABLET | ORAL | 0 refills | Status: DC

## 2016-10-21 MED ORDER — SODIUM CHLORIDE 0.9 % IV SOLN
Freq: Once | INTRAVENOUS | Status: AC
  Administered 2016-10-21: 12:00:00 via INTRAVENOUS
  Filled 2016-10-21: qty 1000

## 2016-10-21 MED ORDER — ONDANSETRON HCL 4 MG/2ML IJ SOLN
8.0000 mg | Freq: Once | INTRAMUSCULAR | Status: AC
  Administered 2016-10-21: 8 mg via INTRAVENOUS

## 2016-10-21 MED ORDER — POTASSIUM CHLORIDE CRYS ER 20 MEQ PO TBCR: 20.0000 meq | EXTENDED_RELEASE_TABLET | Freq: Every day | ORAL | 0 refills | Status: DC

## 2016-10-21 MED ORDER — DOXYCYCLINE HYCLATE 100 MG PO TABS: 100.0000 mg | ORAL_TABLET | Freq: Two times a day (BID) | ORAL | 0 refills | Status: DC

## 2016-10-21 MED ORDER — MAGNESIUM OXIDE 400 (241.3 MG) MG PO TABS: 400.0000 mg | ORAL_TABLET | Freq: Three times a day (TID) | ORAL | 0 refills | Status: DC

## 2016-10-21 MED ORDER — SODIUM CHLORIDE 0.9 % IV SOLN: Freq: Once | INTRAVENOUS | Status: DC

## 2016-10-21 MED ORDER — MORPHINE SULFATE (PF) 4 MG/ML IV SOLN
INTRAVENOUS | Status: AC
  Filled 2016-10-21: qty 1

## 2016-10-21 MED ORDER — ONDANSETRON HCL 4 MG/2ML IJ SOLN
INTRAMUSCULAR | Status: AC
  Filled 2016-10-21: qty 4

## 2016-10-21 MED ORDER — MORPHINE SULFATE 4 MG/ML IJ SOLN
2.0000 mg | Freq: Once | INTRAMUSCULAR | Status: AC
  Administered 2016-10-21: 2 mg via INTRAVENOUS
  Filled 2016-10-21: qty 1

## 2016-10-21 NOTE — Progress Notes (Signed)
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  LOS15: Etoposide Days 1, 2, 3 + Cisplatin Day 1 q21 Days x 4 Cycles with Concurrent Radiation**   A cycle is every 21 days:     Etoposide (Toposar(R)) 100 mg/m2 in a total of 500 mL NS IV over 2 hours days 1, 2, and 3 Dose Mod: None     Cisplatin (Platinol(R)) 75 mg/m2 in a total of 500 mL NS IV over 2 hours day 1 only. **Prehydrate and consider post-hydration** Dose Mod: None  **Always confirm dose/schedule in your pharmacy ordering system**    REASON: Toxicities / Adverse Event PRIOR TREATMENT: LOS15: Etoposide Days 1, 2, 3 + Cisplatin Day 1 q21 Days x 4 Cycles with Concurrent Radiation** TREATMENT RESPONSE: Unable to Evaluate  START ON PATHWAY REGIMEN - Small Cell Lung  LOS320: Etoposide 100 mg/m2 Days 1, 2, 3 + Carboplatin AUC=5 Day 1 q21 Days x 4 Cycles with Concurrent Radiation**   A cycle is every 21 days:     Etoposide (Toposar(R)) 100 mg/m2 in 500 mL NS IV over 2 hours days 1-3 Dose Mod: None     Carboplatin (Paraplatin(R)) AUC=5 in 500 mL NS IV over 1 hour day 1 only Dose Mod: None Additional Orders: * All AUC calculations intended to be used in [employer] formula  **Always confirm dose/schedule in your pharmacy ordering system**    Patient Characteristics: Limited Stage, First Line Check here if patient was staged using an edition prior to AJCC Staging - 8th Edition (i.e., prior to December 02, 2016)? false Stage Grouping: Limited AJCC T Category: T1a AJCC N Category: N2 AJCC M Category: M0 AJCC 8 Stage Grouping: IIIA Line of therapy: First Line Would you be surprised if this patient died  in the next year? I would be surprised if this patient died in the next year  Intent of Therapy: Curative Intent, Discussed with Patient

## 2016-10-21 NOTE — Telephone Encounter (Signed)
Message sent to chemo scheduler to reschedule chemo per 10/21/16 los.  Lab and follow up scheduled for 10/28/16 per, Kristin/10/21/16 los. Appointments scheduled per 10/21/16 los. A copy of the AVS report & appointment schedule was given to patient,per 10/21/16 los.

## 2016-10-21 NOTE — Progress Notes (Signed)
No images are attached to the encounter. No scans are attached to the encounter. No scans are attached to the encounter. [place] VISIT PROGRESS NOTE  Glo Herring., MD [place] [id number]  DIAGNOSIS: Limited stage (T1b, N2, M0) small cell lung carcinoma. She presented with left lower lobe/infrahilar mass as well as a large mediastinal lymphadenopathy diagnosed in October 2017.  PRIOR THERAPY: None  CURRENT THERAPY: Systemic chemotherapy with cisplatin 60 mg meter squared on day 1 and etoposide 120 mm meter squared on days 1, 2, and 3 concurrent with radiotherapy for the first 2 cycles.  INTERVAL HISTORY: JACKELYNE SAYER 60 y.o. female returns for follow-up prior to cycle 2 of her chemotherapy with a friend. The patient has not been feeling well since her first cycle of chemotherapy. She was admitted to the hospital for febrile neutropenia and placed on IV antibiotics during the hospitalization and discharged home on oral Levaquin. This past weekend, the patient experienced a fever up to 101 and was seen in the emergency room that day. She complained of nausea without vomiting and was given IV hydration. The patient today remains nauseated without any vomiting. She is not been able to eat or drink very well. She reports intermittent fevers, but is not sure how high they have been since she is taking Vicodin as needed. Reports "everything hurts." She reports chest pain in the center of the chest which radiates up to her right shoulder. No shortness of breath noted. She does wear her oxygen at night. Patient denies abdominal pain. She reports nausea without vomiting. Taking Zofran on an as-needed basis. Reports mucositis. She also reports a new rash which is developed on her lower extremities as well as her arms. These areas are red and raised and tender to touch. She states it is not really itching. The patient is here for consideration of cycle 2 of her  chemotherapy today.  MEDICAL HISTORY: Past Medical History:  Diagnosis Date  . Anxiety    takes Prozac daily  . Arthritis   . Arthritis   . Cancer ([facility])    skin, lung  . Colon polyps   . COPD (chronic obstructive pulmonary disease) ([facility])   . Diverticulitis   . Dyspnea    with exertion  . Fibromyalgia   . GERD (gastroesophageal reflux disease)    takes Pantoprazole daily  . Hypertension    takes Metoprolol,Triamterene-HCTZ,and Amlodipine daily  . Hypothyroidism    takes Synthroid daily  . IBS (irritable bowel syndrome)   . PONV (postoperative nausea and vomiting)    pt also states that she had some difficulty breathing after cervical fusion    ALLERGIES:  is allergic to penicillins; codeine; ranitidine hcl; keflex [cephalexin]; and lyrica [pregabalin].  MEDICATIONS:  Current Outpatient Prescriptions  Medication Sig Dispense Refill  . albuterol (PROVENTIL) 2 MG tablet Take 2 mg by mouth daily as needed for wheezing or shortness of breath.     Marland Kitchen amLODipine (NORVASC) 5 MG tablet Take 5 mg by mouth daily.      Marland Kitchen aspirin EC 81 MG tablet Take 81 mg by mouth every other day.     . estazolam (PROSOM) 2 MG tablet Take 2 mg by mouth at bedtime.    Marland Kitchen estradiol (ESTRACE) 2 MG tablet Take 2 mg by mouth daily.      Marland Kitchen FLUoxetine (PROZAC) 40 MG capsule Take 40 mg by mouth daily.   2  . levofloxacin (LEVAQUIN) 750 MG tablet  Take 1 tablet (750 mg total) by mouth daily. 5 tablet 0  . levothyroxine (SYNTHROID, LEVOTHROID) 25 MCG tablet Take 25 mcg by mouth daily.      . magnesium oxide (MAG-OX) 400 (241.3 Mg) MG tablet Take 1 tablet (400 mg total) by mouth 3 (three) times daily. 90 tablet 0  . metoprolol succinate (TOPROL-XL) 25 MG 24 hr tablet Take 25 mg by mouth daily.    . Multiple Vitamin (MULTIVITAMIN WITH MINERALS) TABS tablet Take 2 tablets by mouth daily.    . ondansetron (ZOFRAN) 8 MG tablet Take 1 tablet (8 mg total) by mouth 3 (three) times daily as needed for nausea or vomiting. 30  tablet 0  . oxyCODONE-acetaminophen (PERCOCET/ROXICET) 5-325 MG tablet Take 1-2 tablets by mouth every 4 (four) hours as needed for severe pain. 20 tablet 0  . pantoprazole (PROTONIX) 40 MG tablet Take 40 mg by mouth 2 (two) times daily.     Marland Kitchen PROAIR HFA 108 (90 BASE) MCG/ACT inhaler Inhale 1-2 puffs into the lungs every 4 (four) hours as needed.   0  . prochlorperazine (COMPAZINE) 10 MG tablet Take 1 tablet (10 mg total) by mouth every 6 (six) hours as needed for nausea or vomiting. 30 tablet 0  . triamterene-hydrochlorothiazide (DYAZIDE) 37.5-25 MG per capsule Take 1 capsule by mouth every morning.       No current facility-administered medications for this visit.     SURGICAL HISTORY:  Past Surgical History:  Procedure Laterality Date  . BIOPSY N/A 05/25/2013   Procedure: BIOPSIES (Random Colon; Duodenal; Gastric);  Surgeon: Danie Binder, MD;  Location: [facility];  Service: Endoscopy;  Laterality: N/A;  . BLADDER SUSPENSION    . BREAST ENHANCEMENT SURGERY    . BREAST IMPLANT REMOVAL    . CERVICAL FUSION  AUG 2013  . CHOLECYSTECTOMY  1999  . COLONOSCOPY  2007 [place]   POLYPS  . COLONOSCOPY WITH PROPOFOL N/A 05/25/2013   Procedure: COLONOSCOPY WITH PROPOFOL(at cecum 0957) total withdrawal time=33mn);  Surgeon: SDanie Binder MD;  Location: [facility];  Service: Endoscopy;  Laterality: N/A;  . ESOPHAGOGASTRODUODENOSCOPY (EGD) WITH PROPOFOL N/A 05/25/2013   Procedure: ESOPHAGOGASTRODUODENOSCOPY (EGD) WITH PROPOFOL;  Surgeon: SDanie Binder MD;  Location: [facility];  Service: Endoscopy;  Laterality: N/A;  . FOOT SURGERY    . POLYPECTOMY N/A 05/25/2013   Procedure: POLYPECTOMY (Rectal and Gastric);  Surgeon: SDanie Binder MD;  Location: [facility];  Service: Endoscopy;  Laterality: N/A;  . TONSILLECTOMY    . UPPER GASTROINTESTINAL ENDOSCOPY    . VIDEO BRONCHOSCOPY WITH ENDOBRONCHIAL ULTRASOUND  09/12/2016   Procedure: VIDEO BRONCHOSCOPY WITH ENDOBRONCHIAL ULTRASOUND AND BIOPSY;  Surgeon: DJuanito Doom MD;  Location: [facility];  Service: Cardiopulmonary;;    REVIEW OF SYSTEMS:  Review of Systems  Constitutional: Positive for fever and malaise/fatigue. Negative for chills.  HENT: Negative.   Eyes: Negative.   Respiratory: Negative.   Cardiovascular: Positive for chest pain. Negative for palpitations, orthopnea, claudication, leg swelling and PND.  Gastrointestinal: Positive for nausea. Negative for abdominal pain, constipation, diarrhea and vomiting.  Genitourinary: Negative.   Musculoskeletal: Positive for joint pain and myalgias.  Skin: Positive for rash. Negative for itching.  Neurological: Positive for weakness. Negative for dizziness and headaches.  Endo/Heme/Allergies: Negative.   Psychiatric/Behavioral: Negative.      PHYSICAL EXAMINATION: Physical Exam  Constitutional: She is oriented to person, place, and time. Vital signs are normal. She appears dehydrated. She appears unhealthy. No distress.  HENT:  Head: Normocephalic and atraumatic.  3 mouth sores noted on her bottom lip.  Eyes: Conjunctivae and EOM are normal. Pupils are equal, round, and reactive to light. Left eye exhibits no discharge. No scleral icterus.  Neck: Normal range of motion. Neck supple. No tracheal deviation present.  Cardiovascular: Normal rate, regular rhythm and normal heart sounds.   Pulmonary/Chest: Effort normal and breath sounds normal. No respiratory distress. She has no wheezes. She has no rales.  Abdominal: Soft. Bowel sounds are normal. She exhibits no distension and no mass. There is no tenderness.  Musculoskeletal: Normal range of motion.  Lymphadenopathy:    She has no cervical adenopathy.  Neurological: She is alert and oriented to person, place, and time.  Skin: She is not diaphoretic.  Scattered raised red areas to her bilateral lower extremities and arms. He is are hardened and tender to touch. No drainage noted.  Psychiatric: Mood, memory, affect and judgment normal.    ECOG  PERFORMANCE STATUS: 2 - Symptomatic, <50% confined to bed  Blood pressure 124/80, pulse (!) 118, temperature 97.7 F (36.5 C), temperature source Oral, resp. rate 14, height '5\' 4"'$  (1.626 m), weight 182 lb 11.2 oz (82.9 kg), SpO2 95 %.  LABORATORY DATA: Lab Results  Component Value Date   WBC 14.2 (H) 10/21/2016   HGB 12.6 10/21/2016   HCT 35.9 10/21/2016   MCV 82.3 10/21/2016   PLT 201 10/21/2016      Chemistry      Component Value Date/Time   NA 137 10/19/2016 1333   NA 140 10/09/2016 1415   K 3.4 (L) 10/19/2016 1333   K 3.8 10/09/2016 1415   CL 98 (L) 10/19/2016 1333   CO2 28 10/19/2016 1333   CO2 27 10/09/2016 1415   BUN 7 10/19/2016 1333   BUN 11.7 10/09/2016 1415   CREATININE 0.79 10/19/2016 1333   CREATININE 0.7 10/09/2016 1415      Component Value Date/Time   CALCIUM 8.9 10/19/2016 1333   CALCIUM 9.5 10/09/2016 1415   ALKPHOS 50 10/19/2016 1333   ALKPHOS 51 10/09/2016 1415   AST 13 (L) 10/19/2016 1333   AST 19 10/09/2016 1415   ALT 10 (L) 10/19/2016 1333   ALT 20 10/09/2016 1415   BILITOT 0.9 10/19/2016 1333   BILITOT 0.74 10/09/2016 1415       RADIOGRAPHIC STUDIES:  Dg Chest 2 View  Result Date: 10/19/2016 CLINICAL DATA:  Shortness of breath, chest pain EXAM: CHEST  2 VIEW COMPARISON:  10/15/2016 FINDINGS: Mild peribronchial thickening. Heart and mediastinal contours are within normal limits. No focal opacities or effusions. No acute bony abnormality. IMPRESSION: Mild bronchitic changes. Electronically Signed   By: Rolm Baptise M.D.   On: 10/19/2016 13:21   Dg Chest 2 View  Result Date: 10/15/2016 CLINICAL DATA:  Onset of fever with nausea vomiting and diarrhea last night. Patient is undergoing treatment for lung malignancy. Patient is a former smoker with history of COPD EXAM: CHEST  2 VIEW COMPARISON:  PA and lateral chest x-ray of August 27, 2016 FINDINGS: The lungs are mildly hyperinflated. There is no focal infiltrate. There is persistent  increased density at the right lung base which follows the contour of the hemidiaphragm. This is new since October 2016 but not significantly changed since August 27, 2016. Soft tissue fullness in the AP window region persists. There is faint calcification in the wall of the aortic arch. The heart and pulmonary vascularity are normal. The observed bony thorax exhibits no acute abnormality.  The patient has undergone previous lower anterior cervical fusion. IMPRESSION: COPD. Persistent increased density at the right lung base. Persistent AP window lymphadenopathy. Chest CT scanning is recommended for further evaluation of the mediastinum and right lower lung. Thoracic aortic atherosclerosis. Electronically Signed   By: David  Martinique M.D.   On: 10/15/2016 10:37   Mr Jeri Cos CZ Contrast  Result Date: 09/27/2016 CLINICAL DATA:  Headache and nausea. History of small cell lung cancer. EXAM: MRI HEAD WITHOUT AND WITH CONTRAST TECHNIQUE: Multiplanar, multiecho pulse sequences of the brain and surrounding structures were obtained without and with intravenous contrast. CONTRAST:  3m MULTIHANCE GADOBENATE DIMEGLUMINE 529 MG/ML IV SOLN COMPARISON:  Brain MRI 12/09/2007 FINDINGS: Brain: No acute infarct or intraparenchymal hemorrhage. The midline structures are normal. Mild hyperintense T2 weighted signal within the pons and periventricular white matter. No mass lesion or midline shift. No hydrocephalus or extra-axial fluid collection. No parenchymal contrast enhancement. Vascular: Major intracranial arterial and venous sinus flow voids are preserved. No evidence of chronic microhemorrhage or amyloid angiopathy. Skull and upper cervical spine: The visualized skull base, calvarium, upper cervical spine and extracranial soft tissues are normal. Sinuses/Orbits: No fluid levels or advanced mucosal thickening. No mastoid effusion. Normal orbits. IMPRESSION: 1. No acute intracranial abnormality. No intracranial metastatic  disease. 2. Findings suggesting mild early chronic microvascular disease. Electronically Signed   By: KUlyses JarredM.D.   On: 09/27/2016 17:01   Nm Pet Image Initial (pi) Skull Base To Thigh  Result Date: 09/27/2016 CLINICAL DATA:  Initial treatment strategy for left lung cancer. EXAM: NUCLEAR MEDICINE PET SKULL BASE TO THIGH TECHNIQUE: 9.1 mCi F-18 FDG was injected intravenously. Full-ring PET imaging was performed from the skull base to thigh after the radiotracer. CT data was obtained and used for attenuation correction and anatomic localization. FASTING BLOOD GLUCOSE:  Value: 88 mg/dl COMPARISON:  CT chest dated 09/02/2016 FINDINGS: NECK No hypermetabolic lymph nodes in the neck. CHEST 3.3 x 1.7 cm mass in the medial left lower lobe adjacent to the descending thoracic aorta (series 4/image 70), max SUV 9.9, corresponding to suspected primary bronchogenic neoplasm. Associated thoracic lymphadenopathy, including: --5.4 x 7.0 cm AP window node (series 4/image 39), max SUV 15.8 --1.7 cm left infrahilar/posterior pericardial node (series 4/ image 73), max SUV 9.9 --Additional left hilar lymphadenopathy (series 4/ image 67), max SUV 9.3 Small bilateral axillary nodes, max SUV 2.4, likely reactive. ABDOMEN/PELVIS No abnormal hypermetabolic activity within the liver, pancreas, adrenal glands, or spleen. Status post cholecystectomy. Atherosclerotic calcifications the abdominal aorta and branch vessels. Sigmoid diverticulosis, without evidence of diverticulitis. No hypermetabolic lymph nodes in the abdomen or pelvis. SKELETON No focal hypermetabolic activity to suggest skeletal metastasis. IMPRESSION: 3.3 cm mass in the medial left lower lobe adjacent to the descending thoracic aorta, corresponding to suspected primary bronchogenic neoplasm. Associated mediastinal and left hilar nodal metastases. Electronically Signed   By: SJulian HyM.D.   On: 09/27/2016 17:15   Dg Abd Portable 1v  Result Date:  10/16/2016 CLINICAL DATA:  Nausea and diarrhea EXAM: PORTABLE ABDOMEN - 1 VIEW COMPARISON:  PET-CT 09/27/2016 FINDINGS: No dilated loops of large or small bowel. Gas and stool in the rectum. No pathologic calcifications. No organomegaly. No acute osseous abnormality. Postcholecystectomy. IMPRESSION: No acute abdominal findings. Electronically Signed   By: SSuzy BouchardM.D.   On: 10/16/2016 07:44     ASSESSMENT/PLAN:  No problem-specific Assessment & Plan notes found for this encounter. This is a 60year old female with recently diagnosed limited stage (  T1b, N2, M0) small cell lung carcinoma. She presented with a left lower lobe/infrahilar mass as well as large mediastinal lymphadenopathy diagnosed in October 2017. She is status post one cycle of systemic chemotherapy with cisplatin 60 mg meter squared on day 1 and etoposide 120 mm meter squared on days 1, 2, and 3 given concurrently with radiotherapy for the first 2 cycles.  The patient was seen and examined with Dr. Julien Nordmann. She is having a lot of side effects related to her recent chemotherapy. We recommended the patient hold her chemotherapy this week. We will obtain an EKG here in the office given her tachycardia and chest pain. EKG resulted with ventricular rate of 98 and normal sinus rhythm.  The patient will receive a liter of IV fluids here in the office which will consist of normal saline with 20 mEq of potassium along with magnesium. She was given 2 mg of IV morphine for her multiple arthralgias and IV Zofran to help with her nausea. Magnesium oxide 400 mg 3 times a day and potassium chloride 20 mEq daily was called into her local pharmacy.  For her rash, she was given a Medrol Dosepak along with doxycycline 100 mg twice a day for 10 days.  Will change her chemotherapy to Carboplatin for and AUC of 5 on day 1 with etoposide 100 mg/m2 on days 1, 2, and 3 with her next cycle of chemotherapy.   The patient will return next week to  recheck labs and see how she is doing. We will potentially reschedule her chemotherapy until next week.  All questions were answered. The patient knows to call the clinic with any problems, questions or concerns. We can certainly see the patient much sooner if necessary.  Mikey Bussing, DNP, [place], AOCNP 10/21/16  ADDENDUM: Hematology/Oncology Attending: I had a face to face encounter with the patient today. I recommended her care plan. This is a very pleasant 60 years old white female recently diagnosed with limited stage small cell lung cancer and currently undergoing systemic chemotherapy with cisplatin and etoposide. Her first cycle was 3 weeks ago and the patient has rough time with this treatment with febrile neutropenia as well as lack of appetite, dehydration and significant nausea. She is also hurting all over her body. The patient also developed macular erythematous rash over her body mainly in the upper and lower extremities. She was admitted to [facility] for treatment of her condition and the significant neutropenia. Unfortunately her insurance company declined coloration for Neulasta. I had a lengthy discussion with the patient today about her condition. For the skin rash, I will start the patient on doxycycline 100 mg by mouth twice a day in addition to Medrol Dosepak. For the limited stage small cell lung cancer, the patient has rough time tolerating the regimen of cisplatin and etoposide. I will change her treatment to carboplatin for AUC of 5 on day 1 and etoposide at 100 MG/M2 on days 1, 2 and 3 with Neulasta support on day 4 because of the significant febrile neutropenia after her first cycle. We will delay the start of cycle #2 x 1 week until improvement of her condition. For the hypomagnesemia, we'll start the patient on magnesium sulfate for gram IV in the clinic today and she will be started on magnesium oxide 400 mg by mouth 4 times a day. For the hypokalemia, the  patient will receive potassium chloride IV in the clinic in addition to oral potassium chloride 20 mEq by mouth daily  for the next 7 days. For the tachycardia and chest pain, we will order EKG today to rule out any cardiac abnormalities. The patient would come back for follow-up visit in one week for evaluation before starting cycle #2 of her treatment. She was advised to call immediately she has any concerning symptoms in the interval.  Disclaimer: This note was dictated with voice recognition software. Similar sounding words can inadvertently be transcribed and may be missed upon review. Eilleen Kempf., MD 10/22/16

## 2016-10-21 NOTE — Patient Instructions (Signed)
Dehydration, Adult Dehydration is a condition in which there is not enough fluid or water in the body. This happens when you lose more fluids than you take in. Important organs, such as the kidneys, brain, and heart, cannot function without a proper amount of fluids. Any loss of fluids from the body can lead to dehydration. Dehydration can range from mild to severe. This condition should be treated right away to prevent it from becoming severe. What are the causes? This condition may be caused by:  Vomiting.  Diarrhea.  Excessive sweating, such as from heat exposure or exercise.  Not drinking enough fluid, especially:  When ill.  While doing activity that requires a lot of energy.  Excessive urination.  Fever.  Infection.  Certain medicines, such as medicines that cause the body to lose excess fluid (diuretics).  Inability to access safe drinking water.  Reduced physical ability to get adequate water and food. What increases the risk? This condition is more likely to develop in people:  Who have a poorly controlled long-term (chronic) illness, such as diabetes, heart disease, or kidney disease.  Who are age 65 or older.  Who are disabled.  Who live in a place with high altitude.  Who play endurance sports. What are the signs or symptoms? Symptoms of mild dehydration may include:   Thirst.  Dry lips.  Slightly dry mouth.  Dry, warm skin.  Dizziness. Symptoms of moderate dehydration may include:   Very dry mouth.  Muscle cramps.  Dark urine. Urine may be the color of tea.  Decreased urine production.  Decreased tear production.  Heartbeat that is irregular or faster than normal (palpitations).  Headache.  Light-headedness, especially when you stand up from a sitting position.  Fainting (syncope). Symptoms of severe dehydration may include:   Changes in skin, such as:  Cold and clammy skin.  Blotchy (mottled) or pale skin.  Skin that does  not quickly return to normal after being lightly pinched and released (poor skin turgor).  Changes in body fluids, such as:  Extreme thirst.  No tear production.  Inability to sweat when body temperature is high, such as in hot weather.  Very little urine production.  Changes in vital signs, such as:  Weak pulse.  Pulse that is more than 100 beats a minute when sitting still.  Rapid breathing.  Low blood pressure.  Other changes, such as:  Sunken eyes.  Cold hands and feet.  Confusion.  Lack of energy (lethargy).  Difficulty waking up from sleep.  Short-term weight loss.  Unconsciousness. How is this diagnosed? This condition is diagnosed based on your symptoms and a physical exam. Blood and urine tests may be done to help confirm the diagnosis. How is this treated? Treatment for this condition depends on the severity. Mild or moderate dehydration can often be treated at home. Treatment should be started right away. Do not wait until dehydration becomes severe. Severe dehydration is an emergency and it needs to be treated in a hospital. Treatment for mild dehydration may include:   Drinking more fluids.  Replacing salts and minerals in your blood (electrolytes) that you may have lost. Treatment for moderate dehydration may include:   Drinking an oral rehydration solution (ORS). This is a drink that helps you replace fluids and electrolytes (rehydrate). It can be found at pharmacies and retail stores. Treatment for severe dehydration may include:   Receiving fluids through an IV tube.  Receiving an electrolyte solution through a feeding tube that is   passed through your nose and into your stomach (nasogastric tube, or NG tube).  Correcting any abnormalities in electrolytes.  Treating the underlying cause of dehydration. Follow these instructions at home:  If directed by your health care provider, drink an ORS:  Make an ORS by following instructions on the  package.  Start by drinking small amounts, about  cup (120 mL) every 5-10 minutes.  Slowly increase how much you drink until you have taken the amount recommended by your health care provider.  Drink enough clear fluid to keep your urine clear or pale yellow. If you were told to drink an ORS, finish the ORS first, then start slowly drinking other clear fluids. Drink fluids such as:  Water. Do not drink only water. Doing that can lead to having too little salt (sodium) in the body (hyponatremia).  Ice chips.  Fruit juice that you have added water to (diluted fruit juice).  Low-calorie sports drinks.  Avoid:  Alcohol.  Drinks that contain a lot of sugar. These include high-calorie sports drinks, fruit juice that is not diluted, and soda.  Caffeine.  Foods that are greasy or contain a lot of fat or sugar.  Take over-the-counter and prescription medicines only as told by your health care provider.  Do not take sodium tablets. This can lead to having too much sodium in the body (hypernatremia).  Eat foods that contain a healthy balance of electrolytes, such as bananas, oranges, potatoes, tomatoes, and spinach.  Keep all follow-up visits as told by your health care provider. This is important. Contact a health care provider if:  You have abdominal pain that:  Gets worse.  Stays in one area (localizes).  You have a rash.  You have a stiff neck.  You are more irritable than usual.  You are sleepier or more difficult to wake up than usual.  You feel weak or dizzy.  You feel very thirsty.  You have urinated only a small amount of very dark urine over 6-8 hours. Get help right away if:  You have symptoms of severe dehydration.  You cannot drink fluids without vomiting.  Your symptoms get worse with treatment.  You have a fever.  You have a severe headache.  You have vomiting or diarrhea that:  Gets worse.  Does not go away.  You have blood or green matter  (bile) in your vomit.  You have blood in your stool. This may cause stool to look black and tarry.  You have not urinated in 6-8 hours.  You faint.  Your heart rate while sitting still is over 100 beats a minute.  You have trouble breathing. This information is not intended to replace advice given to you by your health care provider. Make sure you discuss any questions you have with your health care provider. Document Released: 11/18/2005 Document Revised: 06/14/2016 Document Reviewed: 01/12/2016 Elsevier Interactive Patient Education  2017 [employer]  

## 2016-10-22 ENCOUNTER — Ambulatory Visit: Payer: Medicare Other

## 2016-10-22 ENCOUNTER — Encounter: Payer: Medicare Other | Admitting: Nutrition

## 2016-10-22 ENCOUNTER — Telehealth: Payer: Self-pay | Admitting: *Deleted

## 2016-10-22 NOTE — Telephone Encounter (Signed)
Per LOS I have scheduled appts and notified the scheduler 

## 2016-10-23 ENCOUNTER — Ambulatory Visit: Payer: Medicare Other

## 2016-10-24 LAB — CULTURE, BLOOD (ROUTINE X 2)
CULTURE: NO GROWTH
Culture: NO GROWTH

## 2016-10-25 ENCOUNTER — Ambulatory Visit: Payer: Medicare Other

## 2016-10-28 ENCOUNTER — Other Ambulatory Visit: Payer: Medicare Other

## 2016-10-28 ENCOUNTER — Telehealth: Payer: Self-pay | Admitting: Medical Oncology

## 2016-10-28 ENCOUNTER — Telehealth: Payer: Self-pay | Admitting: *Deleted

## 2016-10-28 ENCOUNTER — Ambulatory Visit: Payer: Medicare Other

## 2016-10-28 ENCOUNTER — Ambulatory Visit: Payer: Medicare Other | Admitting: Oncology

## 2016-10-28 NOTE — Telephone Encounter (Signed)
Oncology Nurse Navigator Documentation  Oncology Nurse Navigator Flowsheets 10/28/2016  Navigator Location [facility]  Navigator Encounter Type Telephone/I called to check on Rebekah Henderson due to her not showing for her appt.  I called her mobil number and left a vm message to call.   Telephone Outgoing Call  Treatment Phase Treatment  Interventions Other  Acuity Level 1  Time Spent with Patient 15

## 2016-10-28 NOTE — Telephone Encounter (Signed)
I left Rebekah Henderson a message to return my call .

## 2016-10-28 NOTE — Telephone Encounter (Signed)
Nausea,vomiting,diarrhea vomiting, cant' keep any thing down. On call Recommended pt go to ED.

## 2016-10-29 ENCOUNTER — Inpatient Hospital Stay (HOSPITAL_COMMUNITY)
Admission: EM | Admit: 2016-10-29 | Discharge: 2016-11-02 | DRG: 391 | Disposition: A | Discharge status: Home / Self Care | Payer: Medicare Other | Attending: Internal Medicine | Admitting: Internal Medicine

## 2016-10-29 ENCOUNTER — Telehealth: Payer: Self-pay | Admitting: Medical Oncology

## 2016-10-29 ENCOUNTER — Ambulatory Visit: Payer: Medicare Other

## 2016-10-29 ENCOUNTER — Encounter (HOSPITAL_COMMUNITY): Payer: Self-pay | Admitting: Emergency Medicine

## 2016-10-29 DIAGNOSIS — C3492 Malignant neoplasm of unspecified part of left bronchus or lung: Secondary | ICD-10-CM | POA: Diagnosis not present

## 2016-10-29 DIAGNOSIS — E871 Hypo-osmolality and hyponatremia: Secondary | ICD-10-CM | POA: Diagnosis not present

## 2016-10-29 DIAGNOSIS — Z801 Family history of malignant neoplasm of trachea, bronchus and lung: Secondary | ICD-10-CM

## 2016-10-29 DIAGNOSIS — Z6830 Body mass index (BMI) 30.0-30.9, adult: Secondary | ICD-10-CM

## 2016-10-29 DIAGNOSIS — J439 Emphysema, unspecified: Secondary | ICD-10-CM

## 2016-10-29 DIAGNOSIS — J9611 Chronic respiratory failure with hypoxia: Secondary | ICD-10-CM | POA: Diagnosis present

## 2016-10-29 DIAGNOSIS — R197 Diarrhea, unspecified: Secondary | ICD-10-CM | POA: Diagnosis not present

## 2016-10-29 DIAGNOSIS — E876 Hypokalemia: Secondary | ICD-10-CM | POA: Diagnosis present

## 2016-10-29 DIAGNOSIS — Z9221 Personal history of antineoplastic chemotherapy: Secondary | ICD-10-CM

## 2016-10-29 DIAGNOSIS — Z86010 Personal history of colonic polyps: Secondary | ICD-10-CM

## 2016-10-29 DIAGNOSIS — K219 Gastro-esophageal reflux disease without esophagitis: Secondary | ICD-10-CM | POA: Diagnosis present

## 2016-10-29 DIAGNOSIS — R112 Nausea with vomiting, unspecified: Secondary | ICD-10-CM | POA: Diagnosis not present

## 2016-10-29 DIAGNOSIS — Z923 Personal history of irradiation: Secondary | ICD-10-CM

## 2016-10-29 DIAGNOSIS — R21 Rash and other nonspecific skin eruption: Secondary | ICD-10-CM | POA: Diagnosis not present

## 2016-10-29 DIAGNOSIS — E860 Dehydration: Secondary | ICD-10-CM

## 2016-10-29 DIAGNOSIS — Z833 Family history of diabetes mellitus: Secondary | ICD-10-CM

## 2016-10-29 DIAGNOSIS — R109 Unspecified abdominal pain: Secondary | ICD-10-CM | POA: Diagnosis not present

## 2016-10-29 DIAGNOSIS — F329 Major depressive disorder, single episode, unspecified: Secondary | ICD-10-CM | POA: Diagnosis present

## 2016-10-29 DIAGNOSIS — L03115 Cellulitis of right lower limb: Secondary | ICD-10-CM | POA: Diagnosis present

## 2016-10-29 DIAGNOSIS — Z803 Family history of malignant neoplasm of breast: Secondary | ICD-10-CM

## 2016-10-29 DIAGNOSIS — E669 Obesity, unspecified: Secondary | ICD-10-CM | POA: Diagnosis present

## 2016-10-29 DIAGNOSIS — Z8249 Family history of ischemic heart disease and other diseases of the circulatory system: Secondary | ICD-10-CM

## 2016-10-29 DIAGNOSIS — E039 Hypothyroidism, unspecified: Secondary | ICD-10-CM | POA: Diagnosis present

## 2016-10-29 DIAGNOSIS — R1030 Lower abdominal pain, unspecified: Secondary | ICD-10-CM

## 2016-10-29 DIAGNOSIS — M797 Fibromyalgia: Secondary | ICD-10-CM | POA: Diagnosis present

## 2016-10-29 DIAGNOSIS — J449 Chronic obstructive pulmonary disease, unspecified: Secondary | ICD-10-CM | POA: Diagnosis present

## 2016-10-29 DIAGNOSIS — E43 Unspecified severe protein-calorie malnutrition: Secondary | ICD-10-CM | POA: Diagnosis present

## 2016-10-29 DIAGNOSIS — R1110 Vomiting, unspecified: Secondary | ICD-10-CM | POA: Diagnosis present

## 2016-10-29 DIAGNOSIS — Z87891 Personal history of nicotine dependence: Secondary | ICD-10-CM

## 2016-10-29 DIAGNOSIS — I10 Essential (primary) hypertension: Secondary | ICD-10-CM | POA: Diagnosis present

## 2016-10-29 DIAGNOSIS — T451X5A Adverse effect of antineoplastic and immunosuppressive drugs, initial encounter: Secondary | ICD-10-CM | POA: Diagnosis present

## 2016-10-29 DIAGNOSIS — Z9049 Acquired absence of other specified parts of digestive tract: Secondary | ICD-10-CM

## 2016-10-29 LAB — CBC WITH DIFFERENTIAL/PLATELET
BASOS PCT: 0 %
Basophils Absolute: 0 10*3/uL (ref 0.0–0.1)
Eosinophils Absolute: 0 10*3/uL (ref 0.0–0.7)
Eosinophils Relative: 0 %
HEMATOCRIT: 38.3 % (ref 36.0–46.0)
HEMOGLOBIN: 12.7 g/dL (ref 12.0–15.0)
Lymphocytes Relative: 6 %
Lymphs Abs: 0.8 10*3/uL (ref 0.7–4.0)
MCH: 28.7 pg (ref 26.0–34.0)
MCHC: 33.2 g/dL (ref 30.0–36.0)
MCV: 86.7 fL (ref 78.0–100.0)
MONOS PCT: 11 %
Monocytes Absolute: 1.5 10*3/uL — ABNORMAL HIGH (ref 0.1–1.0)
NEUTROS ABS: 11.6 10*3/uL — AB (ref 1.7–7.7)
NEUTROS PCT: 83 %
Platelets: 599 10*3/uL — ABNORMAL HIGH (ref 150–400)
RBC: 4.42 MIL/uL (ref 3.87–5.11)
RDW: 14.1 % (ref 11.5–15.5)
WBC: 13.9 10*3/uL — AB (ref 4.0–10.5)

## 2016-10-29 LAB — COMPREHENSIVE METABOLIC PANEL
ALBUMIN: 3.2 g/dL — AB (ref 3.5–5.0)
ALK PHOS: 55 U/L (ref 38–126)
ALT: 9 U/L — AB (ref 14–54)
ANION GAP: 13 (ref 5–15)
AST: 12 U/L — AB (ref 15–41)
BILIRUBIN TOTAL: 0.9 mg/dL (ref 0.3–1.2)
BUN: 19 mg/dL (ref 6–20)
CALCIUM: 8.7 mg/dL — AB (ref 8.9–10.3)
CO2: 25 mmol/L (ref 22–32)
CREATININE: 1.02 mg/dL — AB (ref 0.44–1.00)
Chloride: 96 mmol/L — ABNORMAL LOW (ref 101–111)
GFR calc Af Amer: >60 mL/min (ref >60)
GFR calc non Af Amer: 58 mL/min — ABNORMAL LOW (ref >60)
GLUCOSE: 126 mg/dL — AB (ref 65–99)
Potassium: 3.4 mmol/L — ABNORMAL LOW (ref 3.5–5.1)
SODIUM: 134 mmol/L — AB (ref 135–145)
TOTAL PROTEIN: 7.7 g/dL (ref 6.5–8.1)

## 2016-10-29 LAB — MAGNESIUM: MAGNESIUM: 2.2 mg/dL (ref 1.7–2.4)

## 2016-10-29 LAB — PHOSPHORUS: PHOSPHORUS: 2.9 mg/dL (ref 2.5–4.6)

## 2016-10-29 LAB — LIPASE, BLOOD: LIPASE: 15 U/L (ref 11–51)

## 2016-10-29 LAB — TSH: TSH: 1.485 u[IU]/mL (ref 0.350–4.500)

## 2016-10-29 MED ORDER — OXYCODONE-ACETAMINOPHEN 5-325 MG PO TABS
1.0000 | ORAL_TABLET | ORAL | Status: DC | PRN
  Administered 2016-10-29 – 2016-11-02 (×18): 2 via ORAL
  Filled 2016-10-29 (×18): qty 2

## 2016-10-29 MED ORDER — TEMAZEPAM 15 MG PO CAPS
30.0000 mg | ORAL_CAPSULE | Freq: Every evening | ORAL | Status: DC | PRN
  Administered 2016-11-01: 30 mg via ORAL
  Filled 2016-10-29 (×2): qty 2

## 2016-10-29 MED ORDER — ALBUTEROL SULFATE HFA 108 (90 BASE) MCG/ACT IN AERS: 1.0000 | INHALATION_SPRAY | RESPIRATORY_TRACT | Status: DC | PRN

## 2016-10-29 MED ORDER — ENSURE ENLIVE PO LIQD: 237.0000 mL | Freq: Two times a day (BID) | ORAL | Status: DC

## 2016-10-29 MED ORDER — ONDANSETRON HCL 4 MG/2ML IJ SOLN
4.0000 mg | Freq: Four times a day (QID) | INTRAMUSCULAR | Status: DC | PRN
Start: 2016-10-29 — End: 2016-11-02
  Administered 2016-10-30 – 2016-11-01 (×6): 4 mg via INTRAVENOUS
  Filled 2016-10-29 (×6): qty 2

## 2016-10-29 MED ORDER — HYDROMORPHONE HCL 1 MG/ML IJ SOLN
0.5000 mg | Freq: Once | INTRAMUSCULAR | Status: AC
  Administered 2016-10-29: 0.5 mg via INTRAVENOUS
  Filled 2016-10-29: qty 1

## 2016-10-29 MED ORDER — ASPIRIN EC 81 MG PO TBEC
81.0000 mg | DELAYED_RELEASE_TABLET | ORAL | Status: DC
  Administered 2016-10-30 – 2016-11-01 (×2): 81 mg via ORAL
  Filled 2016-10-29 (×2): qty 1

## 2016-10-29 MED ORDER — HEPARIN SODIUM (PORCINE) 5000 UNIT/ML IJ SOLN
5000.0000 [IU] | Freq: Three times a day (TID) | INTRAMUSCULAR | Status: DC
  Administered 2016-10-29 – 2016-11-01 (×8): 5000 [IU] via SUBCUTANEOUS
  Filled 2016-10-29 (×8): qty 1

## 2016-10-29 MED ORDER — ALBUTEROL SULFATE (2.5 MG/3ML) 0.083% IN NEBU: 2.5000 mg | INHALATION_SOLUTION | RESPIRATORY_TRACT | Status: DC | PRN

## 2016-10-29 MED ORDER — POTASSIUM CHLORIDE IN NACL 20-0.9 MEQ/L-% IV SOLN
INTRAVENOUS | Status: DC
  Administered 2016-10-29 – 2016-11-02 (×7): via INTRAVENOUS
  Filled 2016-10-29 (×10): qty 1000

## 2016-10-29 MED ORDER — FLUOXETINE HCL 20 MG PO CAPS
40.0000 mg | ORAL_CAPSULE | Freq: Every day | ORAL | Status: DC
  Administered 2016-10-29 – 2016-11-02 (×5): 40 mg via ORAL
  Filled 2016-10-29 (×5): qty 2

## 2016-10-29 MED ORDER — BOOST / RESOURCE BREEZE PO LIQD
1.0000 | Freq: Three times a day (TID) | ORAL | Status: DC
  Administered 2016-10-29: 1 via ORAL

## 2016-10-29 MED ORDER — ONDANSETRON HCL 4 MG/2ML IJ SOLN
4.0000 mg | Freq: Once | INTRAMUSCULAR | Status: AC
  Administered 2016-10-29: 4 mg via INTRAVENOUS
  Filled 2016-10-29: qty 2

## 2016-10-29 MED ORDER — METOPROLOL SUCCINATE ER 25 MG PO TB24
25.0000 mg | ORAL_TABLET | Freq: Every day | ORAL | Status: DC
  Administered 2016-10-29 – 2016-11-02 (×5): 25 mg via ORAL
  Filled 2016-10-29 (×5): qty 1

## 2016-10-29 MED ORDER — ADULT MULTIVITAMIN W/MINERALS CH
2.0000 | ORAL_TABLET | Freq: Every day | ORAL | Status: DC
  Administered 2016-10-30 – 2016-11-02 (×4): 2 via ORAL
  Filled 2016-10-29 (×4): qty 2

## 2016-10-29 MED ORDER — SODIUM CHLORIDE 0.9 % IV BOLUS (SEPSIS)
1000.0000 mL | Freq: Once | INTRAVENOUS | Status: AC
  Administered 2016-10-29: 1000 mL via INTRAVENOUS

## 2016-10-29 MED ORDER — LEVOTHYROXINE SODIUM 25 MCG PO TABS
25.0000 ug | ORAL_TABLET | Freq: Every day | ORAL | Status: DC
  Administered 2016-10-30 – 2016-11-02 (×4): 25 ug via ORAL
  Filled 2016-10-29 (×4): qty 1

## 2016-10-29 MED ORDER — ESTRADIOL 2 MG PO TABS
2.0000 mg | ORAL_TABLET | Freq: Every day | ORAL | Status: DC
  Administered 2016-10-29 – 2016-11-02 (×5): 2 mg via ORAL
  Filled 2016-10-29 (×5): qty 1

## 2016-10-29 NOTE — H&P (Signed)
History and Physical    DAVISHA LINTHICUM UJW:[id number] DOB: 1956-11-12 DOA: 10/29/2016  PCP: Glo Herring., MD  Patient coming from: home  Chief Complaint: abdominal discomfort, diarrhea, nausea/emesis  HPI: Rebekah Henderson is a 60 y.o. 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 Followed by Dr. Julien Nordmann. Patient is complaining of the problems listed above. Reports that these problems have been present for last 3 weeks. She states she recently started magnesium supplementation around the same time and she suspects this may be related. The abdominal discomfort is in her lower abdomen and does not radiate. It is described as an achy discomfort. She denies any bloody diarrhea or travel outside of the country. The nausea has been gradually getting worse and patient reports poor oral intake of solids and liquids as a result. She reports that eating makes her discomfort worse. The problems listed above persistent and gradually getting worse and patient is not sure of anything that makes it better. Patient also complains of rash which presented 3 weeks ago along with the problems described above under chief complaint. States that the rash started in her legs and has crept up and is currently affecting her arms as well. This also is a problem which presented insidiously and is gradually getting worse. Nothing she is aware of makes it better or worse  ED Course: Patient was found to have mild leukocytosis at 13.9 and was afebrile. She was also found to have mild hypokalemia and hyponatremia.  Review of Systems: As per HPI otherwise 10 point review of systems negative.    Past Medical History:  Diagnosis Date  . Anxiety    takes Prozac daily  . Arthritis   . Arthritis   . Cancer ([place])    skin, lung  . Colon polyps   . COPD (chronic obstructive pulmonary disease) ([place])     . Diverticulitis   . Dyspnea    with exertion  . Fibromyalgia   . GERD (gastroesophageal reflux disease)    takes Pantoprazole daily  . Hypertension    takes Metoprolol,Triamterene-HCTZ,and Amlodipine daily  . Hypothyroidism    takes Synthroid daily  . IBS (irritable bowel syndrome)   . PONV (postoperative nausea and vomiting)    pt also states that she had some difficulty breathing after cervical fusion    Past Surgical History:  Procedure Laterality Date  . BIOPSY N/A 05/25/2013   Procedure: BIOPSIES (Random Colon; Duodenal; Gastric);  Surgeon: Danie Binder, MD;  Location: [facility];  Service: Endoscopy;  Laterality: N/A;  . BLADDER SUSPENSION    . BREAST ENHANCEMENT SURGERY    . BREAST IMPLANT REMOVAL    . CERVICAL FUSION  AUG 2013  . CHOLECYSTECTOMY  1999  . COLONOSCOPY  2007 [place]   POLYPS  . COLONOSCOPY WITH PROPOFOL N/A 05/25/2013   Procedure: COLONOSCOPY WITH PROPOFOL(at cecum 0957) total withdrawal time=69mn);  Surgeon: SDanie Binder MD;  Location: [facility];  Service: Endoscopy;  Laterality: N/A;  . ESOPHAGOGASTRODUODENOSCOPY (EGD) WITH PROPOFOL N/A 05/25/2013   Procedure: ESOPHAGOGASTRODUODENOSCOPY (EGD) WITH PROPOFOL;  Surgeon: SDanie Binder MD;  Location: [facility];  Service: Endoscopy;  Laterality: N/A;  . FOOT SURGERY    . POLYPECTOMY N/A 05/25/2013   Procedure: POLYPECTOMY (Rectal and Gastric);  Surgeon: SDanie Binder MD;  Location: [facility];  Service: Endoscopy;  Laterality: N/A;  . TONSILLECTOMY    . UPPER GASTROINTESTINAL  ENDOSCOPY    . VIDEO BRONCHOSCOPY WITH ENDOBRONCHIAL ULTRASOUND  09/12/2016   Procedure: VIDEO BRONCHOSCOPY WITH ENDOBRONCHIAL ULTRASOUND AND BIOPSY;  Surgeon: Juanito Doom, MD;  Location: [place] [facility];  Service: Cardiopulmonary;;     reports that she quit smoking about 12 years ago. She has a 40.00 pack-year smoking history. She has never used smokeless tobacco. She reports that she does not drink alcohol or use drugs.  Allergies  Allergen  Reactions  . Penicillins Hives and Swelling    Has patient had a PCN reaction causing immediate rash, facial/tongue/throat swelling, SOB or lightheadedness with hypotension:unsure  Has patient had a PCN reaction causing severe rash involving mucus membranes or skin necrosis:unsure Has patient had a PCN reaction that required hospitalization:No Has patient had a PCN reaction occurring within the last 10 years:No If all of the above answers are "NO", then may proceed with Cephalosporin use.   . Codeine Nausea Only  . Ranitidine Hcl Other (See Comments)    dizzy  . Keflex [Cephalexin] Other (See Comments)    UNSPECIFIED REACTION   . Lyrica [Pregabalin] Other (See Comments)    lethargic    Family History  Problem Relation Age of Onset  . Breast cancer Mother   . Diabetes Maternal Grandfather   . Lung cancer Father   . Heart failure Sister 54    Died. Morbidly obese  . Colon cancer Neg Hx   . Colon polyps Neg Hx     Prior to Admission medications   Medication Sig Start Date End Date Taking? Authorizing Provider  albuterol (PROVENTIL) 2 MG tablet Take 2 mg by mouth daily as needed for wheezing or shortness of breath.    Yes Historical Provider, MD  amLODipine (NORVASC) 5 MG tablet Take 5 mg by mouth daily.     Yes Historical Provider, MD  aspirin EC 81 MG tablet Take 81 mg by mouth every other day.    Yes Historical Provider, MD  doxycycline (VIBRA-TABS) 100 MG tablet Take 1 tablet (100 mg total) by mouth 2 (two) times daily. 10/21/16  Yes Maryanna Shape, NP  estazolam (PROSOM) 2 MG tablet Take 2 mg by mouth at bedtime.   Yes Historical Provider, MD  estradiol (ESTRACE) 2 MG tablet Take 2 mg by mouth daily.     Yes Historical Provider, MD  FLUoxetine (PROZAC) 40 MG capsule Take 40 mg by mouth daily.  06/03/15  Yes Historical Provider, MD  HYDROcodone-acetaminophen (NORCO/VICODIN) 5-325 MG tablet Take 1 tablet by mouth every 4 (four) hours as needed for moderate pain.  10/14/16  Yes  Historical Provider, MD  levothyroxine (SYNTHROID, LEVOTHROID) 25 MCG tablet Take 25 mcg by mouth daily.     Yes Historical Provider, MD  loperamide (IMODIUM) 2 MG capsule Take 2 mg by mouth every 2 (two) hours as needed for diarrhea or loose stools.   Yes Historical Provider, MD  magnesium oxide (MAG-OX) 400 (241.3 Mg) MG tablet Take 1 tablet (400 mg total) by mouth 3 (three) times daily. 10/14/16  Yes Curt Bears, MD  metoprolol succinate (TOPROL-XL) 25 MG 24 hr tablet Take 25 mg by mouth daily.   Yes Historical Provider, MD  Multiple Vitamin (MULTIVITAMIN WITH MINERALS) TABS tablet Take 2 tablets by mouth daily.   Yes Historical Provider, MD  ondansetron (ZOFRAN) 8 MG tablet Take 1 tablet (8 mg total) by mouth 3 (three) times daily as needed for nausea or vomiting. 10/08/16  Yes Curt Bears, MD  oxyCODONE-acetaminophen (PERCOCET/ROXICET) 5-325 MG tablet  Take 1-2 tablets by mouth every 4 (four) hours as needed for severe pain. 10/19/16  Yes Lacretia Leigh, MD  pantoprazole (PROTONIX) 40 MG tablet Take 40 mg by mouth 2 (two) times daily.    Yes Historical Provider, MD  potassium chloride SA (K-DUR,KLOR-CON) 20 MEQ tablet Take 1 tablet (20 mEq total) by mouth daily. 10/21/16  Yes Maryanna Shape, NP  PROAIR HFA 108 (90 BASE) MCG/ACT inhaler Inhale 1-2 puffs into the lungs every 4 (four) hours as needed.  05/29/15  Yes Historical Provider, MD  prochlorperazine (COMPAZINE) 10 MG tablet Take 1 tablet (10 mg total) by mouth every 6 (six) hours as needed for nausea or vomiting. 09/19/16  Yes Curt Bears, MD  triamterene-hydrochlorothiazide (DYAZIDE) 37.5-25 MG per capsule Take 1 capsule by mouth every morning.     Yes Historical Provider, MD  levofloxacin (LEVAQUIN) 750 MG tablet Take 1 tablet (750 mg total) by mouth daily. 10/17/16   Erline Hau, MD  magnesium oxide (MAG-OX) 400 (241.3 Mg) MG tablet Take 1 tablet (400 mg total) by mouth 3 (three) times daily. Patient not taking:  Reported on 10/29/2016 10/21/16   Maryanna Shape, NP  methylPREDNISolone (MEDROL) 4 MG tablet Take 6 tabs by mouth on day 1, 5 tabs on day 2, 4 tabs on day 3, 3 tabs on day 4, 2 tabs on day 5, and 1 tabs on day 6. Then stop. 10/21/16   Maryanna Shape, NP    Physical Exam: Vitals:   10/29/16 0855 10/29/16 1113  BP: 146/87 122/62  Pulse: 110 92  Resp: 19 16  Temp: 97.5 F (36.4 C)   TempSrc: Oral   SpO2: 96% 94%   Constitutional: NAD, calm, comfortable Vitals:   10/29/16 0855 10/29/16 1113  BP: 146/87 122/62  Pulse: 110 92  Resp: 19 16  Temp: 97.5 F (36.4 C)   TempSrc: Oral   SpO2: 96% 94%   Eyes: PERRL, lids and conjunctivae normal Head: atraumatic, normocephalic ENMT: Mucous membranes are dry. Posterior pharynx clear of any exudate or lesions. Neck: normal, supple, no masses, no thyromegaly Respiratory: clear to auscultation bilaterally, no wheezing, no crackles. Normal respiratory effort. No accessory muscle use.  Cardiovascular: Regular rate and rhythm, no murmurs / rubs / gallops. No extremity edema. 2+ pedal pulses. No carotid bruits.  Abdomen: no tenderness on palpation, no masses palpated. No hepatosplenomegaly. Bowel sounds positive.  Musculoskeletal: no clubbing / cyanosis. No joint deformity upper and lower extremities.  Skin: + rashes, No induration Neurologic: CN 3-12 grossly intact. Sensation intact, DTR normal. Strength 5/5 in all 4.  Psychiatric: Normal judgment and insight. Alert and oriented x 3. Normal mood.    Labs on Admission: I have personally reviewed following labs and imaging studies  CBC:  Recent Labs Lab 10/29/16 0949  WBC 13.9*  NEUTROABS 11.6*  HGB 12.7  HCT 38.3  MCV 86.7  PLT 458*   Basic Metabolic Panel:  Recent Labs Lab 10/29/16 0949  NA 134*  K 3.4*  CL 96*  CO2 25  GLUCOSE 126*  BUN 19  CREATININE 1.02*  CALCIUM 8.7*   GFR: Estimated Creatinine Clearance: 59.6 mL/min (by C-G formula based on SCr of 1.02 mg/dL  (H)). Liver Function Tests:  Recent Labs Lab 10/29/16 0949  AST 12*  ALT 9*  ALKPHOS 55  BILITOT 0.9  PROT 7.7  ALBUMIN 3.2*   No results for input(s): LIPASE, AMYLASE in the last 168 hours. No results for input(s): AMMONIA in  the last 168 hours. Coagulation Profile: No results for input(s): INR, PROTIME in the last 168 hours. Cardiac Enzymes: No results for input(s): CKTOTAL, CKMB, CKMBINDEX, TROPONINI in the last 168 hours. BNP (last 3 results) No results for input(s): PROBNP in the last 8760 hours. HbA1C: No results for input(s): HGBA1C in the last 72 hours. CBG: No results for input(s): GLUCAP in the last 168 hours. Lipid Profile: No results for input(s): CHOL, HDL, LDLCALC, TRIG, CHOLHDL, LDLDIRECT in the last 72 hours. Thyroid Function Tests: No results for input(s): TSH, T4TOTAL, FREET4, T3FREE, THYROIDAB in the last 72 hours. Anemia Panel: No results for input(s): VITAMINB12, FOLATE, FERRITIN, TIBC, IRON, RETICCTPCT in the last 72 hours. Urine analysis:    Component Value Date/Time   COLORURINE AMBER (A) 10/19/2016 1256   APPEARANCEUR CLOUDY (A) 10/19/2016 1256   LABSPEC 1.022 10/19/2016 1256   PHURINE 6.5 10/19/2016 1256   GLUCOSEU NEGATIVE 10/19/2016 1256   HGBUR NEGATIVE 10/19/2016 1256   BILIRUBINUR MODERATE (A) 10/19/2016 1256   KETONESUR 40 (A) 10/19/2016 1256   PROTEINUR NEGATIVE 10/19/2016 1256   UROBILINOGEN 0.2 03/10/2014 1027   NITRITE NEGATIVE 10/19/2016 1256   LEUKOCYTESUR NEGATIVE 10/19/2016 1256   Sepsis Labs: !!!!!!!!!!!!!!!!!!!!!!!!!!!!!!!!!!!!!!!!!!!! _0 (procalcitonin:4,lacticidven:4) ) Recent Results (from the past 240 hour(s))  Culture, blood (Routine X 2) w Reflex to ID Panel     Status: None   Collection Time: 10/19/16  1:48 PM  Result Value Ref Range Status   Specimen Description BLOOD RIGHT HAND  Final   Special Requests IN PEDIATRIC BOTTLE 3CC  Final   Culture   Final    NO GROWTH 5 DAYS Performed at [facility]    Report Status 10/24/2016 FINAL  Final     Radiological Exams on Admission: No results found.  EKG: Independently reviewed. EKG on 10/21/2016 reviewed and reports sinus rhythm with no ST elevations or depressions  Assessment/Plan Active Problems:   Intractable nausea and vomiting -Place on Zofran every 6 hours as needed - Continue supportive therapy. Advance diet as tolerated we'll start with clears    Diarrhea - We'll obtain stool culture, ova and parasite test, GI pathogen panel - Contact precautions for now    COPD (chronic obstructive pulmonary disease) ([facility]) - Stable continue albuterol    Fibromyalgia - Stable continue chronic pain medication regimen    GERD (gastroesophageal reflux disease) - Favor discontinuing PPI given problems listed above particularly diarrhea.    Small cell lung cancer, left [facility]) - Oncology aware of admission and will follow per my discussion with ED personnel    Abdominal pain  - AST and ALT near normal limits, alk phosphatase within normal limits, no epigastric pain reported but will obtain lipase  Rash - Question is whether this is secondary to chemotherapy side effect. Oncology will follow along. Will treat pain. Will discontinue amlodipine as this is been known to cause rashes. Also will discontinue magnesium supplementation which patient reports was started 3 weeks ago around the time the rash first appeared.   Hypokalemia - replaced IV (see fluid orders)  Hyponatremia - place on normal saline - bmp ordered for next am.   DVT prophylaxis: heparin Code Status: full Family Communication: d/c patient directly Disposition Plan: observation Consults called: *Dr. Julien Nordmann to see in house per my discussion with ED MD Admission status: med surg   Velvet Bathe MD [facility] Pager [phone]  If 7PM-7AM, please contact night-coverage [email or web address] Password [email or web address]  10/29/2016, 1:39 PM

## 2016-10-29 NOTE — ED Notes (Signed)
Patient given gingerale for po challenge.

## 2016-10-29 NOTE — ED Triage Notes (Signed)
Patient states that she has been having n/v/d on and off for several days. Patient also c/o reddened spots on bilat legs, arms neck that have been going on for over 3 weeks. Patient states that she was here last Monday and was given IV fluids and potassium and sent home with antibiotics.  Patient c/o pain at the spots on body.

## 2016-10-29 NOTE — ED Provider Notes (Signed)
[facility] DEPT Provider Note   CSN: [id number] Arrival date & time: 10/29/16  0844     History   Chief Complaint Chief Complaint  Patient presents with  . Emesis  . Diarrhea  . spots on body    HPI Rebekah Henderson is a 60 y.o. female.  Patient complains of vomiting and diarrhea for a couple days. She's also had a rash for couple weeks it seems be getting worse. She was seen by her cancer doctor one week ago and was put on prednisone and Levaquin without any help.   The history is provided by the patient. No language interpreter was used.  Emesis   This is a recurrent problem. The current episode started more than 2 days ago. The problem occurs continuously. The problem has been resolved. The emesis has an appearance of stomach contents. There has been no fever. Pertinent negatives include no abdominal pain, no cough, no diarrhea and no headaches. Risk factors: Treatment for cancer.    Past Medical History:  Diagnosis Date  . Anxiety    takes Prozac daily  . Arthritis   . Arthritis   . Cancer ([place])    skin, lung  . Colon polyps   . COPD (chronic obstructive pulmonary disease) ([place])   . Diverticulitis   . Dyspnea    with exertion  . Fibromyalgia   . GERD (gastroesophageal reflux disease)    takes Pantoprazole daily  . Hypertension    takes Metoprolol,Triamterene-HCTZ,and Amlodipine daily  . Hypothyroidism    takes Synthroid daily  . IBS (irritable bowel syndrome)   . PONV (postoperative nausea and vomiting)    pt also states that she had some difficulty breathing after cervical fusion    Patient Active Problem List   Diagnosis Date Noted  . Vomiting 10/29/2016  . Neutropenic fever ([place]) 10/15/2016  . Intractable nausea and vomiting 10/15/2016  . Pancytopenia due to antineoplastic chemotherapy ([place]) 10/15/2016  . Nodular rash 10/15/2016  . Hypokalemia 10/15/2016  . Hypomagnesemia 10/15/2016  . Chronic respiratory failure with hypoxia ([place]) 10/15/2016  .  Small cell lung cancer, left ([place]) 09/19/2016  . Mediastinal mass 09/10/2016  . COPD (chronic obstructive pulmonary disease) ([place])   . Hypertension   . Fibromyalgia   . IBS (irritable bowel syndrome)   . GERD (gastroesophageal reflux disease)   . Hypothyroidism   . Colon polyps   . Anxiety   . Arthritis   . PONV (postoperative nausea and vomiting)   . Cancer ([place])   . Diverticulitis   . Abdominal pain, other specified site 05/12/2013  . Diarrhea 05/12/2013    Past Surgical History:  Procedure Laterality Date  . BIOPSY N/A 05/25/2013   Procedure: BIOPSIES (Random Colon; Duodenal; Gastric);  Surgeon: Danie Binder, MD;  Location: [facility];  Service: Endoscopy;  Laterality: N/A;  . BLADDER SUSPENSION    . BREAST ENHANCEMENT SURGERY    . BREAST IMPLANT REMOVAL    . CERVICAL FUSION  AUG 2013  . CHOLECYSTECTOMY  1999  . COLONOSCOPY  2007 [place]   POLYPS  . COLONOSCOPY WITH PROPOFOL N/A 05/25/2013   Procedure: COLONOSCOPY WITH PROPOFOL(at cecum 0957) total withdrawal time=58mn);  Surgeon: SDanie Binder MD;  Location: [facility];  Service: Endoscopy;  Laterality: N/A;  . ESOPHAGOGASTRODUODENOSCOPY (EGD) WITH PROPOFOL N/A 05/25/2013   Procedure: ESOPHAGOGASTRODUODENOSCOPY (EGD) WITH PROPOFOL;  Surgeon: SDanie Binder MD;  Location: [facility];  Service: Endoscopy;  Laterality: N/A;  . FOOT SURGERY    . POLYPECTOMY  N/A 05/25/2013   Procedure: POLYPECTOMY (Rectal and Gastric);  Surgeon: Danie Binder, MD;  Location: [facility];  Service: Endoscopy;  Laterality: N/A;  . TONSILLECTOMY    . UPPER GASTROINTESTINAL ENDOSCOPY    . VIDEO BRONCHOSCOPY WITH ENDOBRONCHIAL ULTRASOUND  09/12/2016   Procedure: VIDEO BRONCHOSCOPY WITH ENDOBRONCHIAL ULTRASOUND AND BIOPSY;  Surgeon: Juanito Doom, MD;  Location: [facility];  Service: Cardiopulmonary;;    OB History    No data available       Home Medications    Prior to Admission medications   Medication Sig Start Date End Date Taking? Authorizing  Provider  albuterol (PROVENTIL) 2 MG tablet Take 2 mg by mouth daily as needed for wheezing or shortness of breath.    Yes Historical Provider, MD  amLODipine (NORVASC) 5 MG tablet Take 5 mg by mouth daily.     Yes Historical Provider, MD  aspirin EC 81 MG tablet Take 81 mg by mouth every other day.    Yes Historical Provider, MD  doxycycline (VIBRA-TABS) 100 MG tablet Take 1 tablet (100 mg total) by mouth 2 (two) times daily. 10/21/16  Yes Maryanna Shape, NP  estazolam (PROSOM) 2 MG tablet Take 2 mg by mouth at bedtime.   Yes Historical Provider, MD  estradiol (ESTRACE) 2 MG tablet Take 2 mg by mouth daily.     Yes Historical Provider, MD  FLUoxetine (PROZAC) 40 MG capsule Take 40 mg by mouth daily.  06/03/15  Yes Historical Provider, MD  HYDROcodone-acetaminophen (NORCO/VICODIN) 5-325 MG tablet Take 1 tablet by mouth every 4 (four) hours as needed for moderate pain.  10/14/16  Yes Historical Provider, MD  levothyroxine (SYNTHROID, LEVOTHROID) 25 MCG tablet Take 25 mcg by mouth daily.     Yes Historical Provider, MD  loperamide (IMODIUM) 2 MG capsule Take 2 mg by mouth every 2 (two) hours as needed for diarrhea or loose stools.   Yes Historical Provider, MD  magnesium oxide (MAG-OX) 400 (241.3 Mg) MG tablet Take 1 tablet (400 mg total) by mouth 3 (three) times daily. 10/14/16  Yes Curt Bears, MD  metoprolol succinate (TOPROL-XL) 25 MG 24 hr tablet Take 25 mg by mouth daily.   Yes Historical Provider, MD  Multiple Vitamin (MULTIVITAMIN WITH MINERALS) TABS tablet Take 2 tablets by mouth daily.   Yes Historical Provider, MD  ondansetron (ZOFRAN) 8 MG tablet Take 1 tablet (8 mg total) by mouth 3 (three) times daily as needed for nausea or vomiting. 10/08/16  Yes Curt Bears, MD  oxyCODONE-acetaminophen (PERCOCET/ROXICET) 5-325 MG tablet Take 1-2 tablets by mouth every 4 (four) hours as needed for severe pain. 10/19/16  Yes Lacretia Leigh, MD  pantoprazole (PROTONIX) 40 MG tablet Take 40 mg by  mouth 2 (two) times daily.    Yes Historical Provider, MD  potassium chloride SA (K-DUR,KLOR-CON) 20 MEQ tablet Take 1 tablet (20 mEq total) by mouth daily. 10/21/16  Yes Maryanna Shape, NP  PROAIR HFA 108 (90 BASE) MCG/ACT inhaler Inhale 1-2 puffs into the lungs every 4 (four) hours as needed.  05/29/15  Yes Historical Provider, MD  prochlorperazine (COMPAZINE) 10 MG tablet Take 1 tablet (10 mg total) by mouth every 6 (six) hours as needed for nausea or vomiting. 09/19/16  Yes Curt Bears, MD  triamterene-hydrochlorothiazide (DYAZIDE) 37.5-25 MG per capsule Take 1 capsule by mouth every morning.     Yes Historical Provider, MD  levofloxacin (LEVAQUIN) 750 MG tablet Take 1 tablet (750 mg total) by mouth daily. 10/17/16  Erline Hau, MD  magnesium oxide (MAG-OX) 400 (241.3 Mg) MG tablet Take 1 tablet (400 mg total) by mouth 3 (three) times daily. Patient not taking: Reported on 10/29/2016 10/21/16   Maryanna Shape, NP  methylPREDNISolone (MEDROL) 4 MG tablet Take 6 tabs by mouth on day 1, 5 tabs on day 2, 4 tabs on day 3, 3 tabs on day 4, 2 tabs on day 5, and 1 tabs on day 6. Then stop. 10/21/16   Maryanna Shape, NP    Family History Family History  Problem Relation Age of Onset  . Breast cancer Mother   . Diabetes Maternal Grandfather   . Lung cancer Father   . Heart failure Sister 62    Died. Morbidly obese  . Colon cancer Neg Hx   . Colon polyps Neg Hx     Social History Social History  Substance Use Topics  . Smoking status: Former Smoker    Packs/day: 2.00    Years: 20.00    Quit date: 05/20/2004  . Smokeless tobacco: Never Used  . Alcohol use No     Allergies   Penicillins; Codeine; Ranitidine hcl; Keflex [cephalexin]; and Lyrica [pregabalin]   Review of Systems Review of Systems  Constitutional: Negative for appetite change and fatigue.  HENT: Negative for congestion, ear discharge and sinus pressure.   Eyes: Negative for discharge.    Respiratory: Negative for cough.   Cardiovascular: Negative for chest pain.  Gastrointestinal: Positive for vomiting. Negative for abdominal pain and diarrhea.  Genitourinary: Negative for frequency and hematuria.  Musculoskeletal: Negative for back pain.  Skin: Positive for rash.  Neurological: Negative for seizures and headaches.  Psychiatric/Behavioral: Negative for hallucinations.     Physical Exam Updated Vital Signs BP 122/62   Pulse 92   Temp 97.5 F (36.4 C) (Oral)   Resp 16   SpO2 94%   Physical Exam  Constitutional: She is oriented to person, place, and time. She appears well-developed.  HENT:  Head: Normocephalic.  Eyes: Conjunctivae and EOM are normal. No scleral icterus.  Neck: Neck supple. No thyromegaly present.  Cardiovascular: Normal rate and regular rhythm.  Exam reveals no gallop and no friction rub.   No murmur heard. Pulmonary/Chest: No stridor. She has no wheezes. She has no rales. She exhibits no tenderness.  Abdominal: She exhibits no distension. There is no tenderness. There is no rebound.  Musculoskeletal: Normal range of motion. She exhibits no edema.  Lymphadenopathy:    She has no cervical adenopathy.  Neurological: She is oriented to person, place, and time. She exhibits normal muscle tone. Coordination normal.  Skin: Rash noted. No erythema.  Tender swollen rash throughout extremities.  Psychiatric: She has a normal mood and affect. Her behavior is normal.     ED Treatments / Results  Labs (all labs ordered are listed, but only abnormal results are displayed) Labs Reviewed  CBC WITH DIFFERENTIAL/PLATELET - Abnormal; Notable for the following:       Result Value   WBC 13.9 (*)    Platelets 599 (*)    Neutro Abs 11.6 (*)    Monocytes Absolute 1.5 (*)    All other components within normal limits  COMPREHENSIVE METABOLIC PANEL - Abnormal; Notable for the following:    Sodium 134 (*)    Potassium 3.4 (*)    Chloride 96 (*)    Glucose,  Bld 126 (*)    Creatinine, Ser 1.02 (*)    Calcium 8.7 (*)  Albumin 3.2 (*)    AST 12 (*)    ALT 9 (*)    GFR calc non Af Amer 58 (*)    All other components within normal limits    EKG  EKG Interpretation None       Radiology No results found.  Procedures Procedures (including critical care time)  Medications Ordered in ED Medications  sodium chloride 0.9 % bolus 1,000 mL (1,000 mLs Intravenous New Bag/Given 10/29/16 0926)  HYDROmorphone (DILAUDID) injection 0.5 mg (0.5 mg Intravenous Given 10/29/16 0929)  ondansetron (ZOFRAN) injection 4 mg (4 mg Intravenous Given 10/29/16 0929)  HYDROmorphone (DILAUDID) injection 0.5 mg (0.5 mg Intravenous Given 10/29/16 1136)  ondansetron (ZOFRAN) injection 4 mg (4 mg Intravenous Given 10/29/16 1135)     Initial Impression / Assessment and Plan / ED Course  I have reviewed the triage vital signs and the nursing notes.  Pertinent labs & imaging results that were available during my care of the patient were reviewed by me and considered in my medical decision making (see chart for details).  Clinical Course     Patient with vomiting and rash. Not improving on Levaquin and prednisone. I spoke with her oncologist Dr. Earlie Server and he felt like patient should be admitted for fluids and treatment  Final Clinical Impressions(s) / ED Diagnoses   Final diagnoses:  Dehydration    New Prescriptions New Prescriptions   No medications on file     Milton Ferguson, MD 10/29/16 1212

## 2016-10-29 NOTE — Plan of Care (Signed)
Problem: Safety: Goal: Ability to remain free from injury will improve Outcome: Progressing Patient educated regarding bed alarm being in use.

## 2016-10-29 NOTE — Telephone Encounter (Signed)
I see where pt is in [facility]

## 2016-10-30 ENCOUNTER — Ambulatory Visit: Payer: Medicare Other

## 2016-10-30 DIAGNOSIS — R0902 Hypoxemia: Secondary | ICD-10-CM | POA: Diagnosis not present

## 2016-10-30 DIAGNOSIS — E039 Hypothyroidism, unspecified: Secondary | ICD-10-CM | POA: Diagnosis not present

## 2016-10-30 DIAGNOSIS — R112 Nausea with vomiting, unspecified: Principal | ICD-10-CM

## 2016-10-30 DIAGNOSIS — Z923 Personal history of irradiation: Secondary | ICD-10-CM | POA: Diagnosis not present

## 2016-10-30 DIAGNOSIS — Z87891 Personal history of nicotine dependence: Secondary | ICD-10-CM | POA: Diagnosis not present

## 2016-10-30 DIAGNOSIS — R109 Unspecified abdominal pain: Secondary | ICD-10-CM | POA: Diagnosis not present

## 2016-10-30 DIAGNOSIS — Z801 Family history of malignant neoplasm of trachea, bronchus and lung: Secondary | ICD-10-CM | POA: Diagnosis not present

## 2016-10-30 DIAGNOSIS — Z86010 Personal history of colonic polyps: Secondary | ICD-10-CM | POA: Diagnosis not present

## 2016-10-30 DIAGNOSIS — J439 Emphysema, unspecified: Secondary | ICD-10-CM | POA: Diagnosis not present

## 2016-10-30 DIAGNOSIS — J449 Chronic obstructive pulmonary disease, unspecified: Secondary | ICD-10-CM | POA: Diagnosis not present

## 2016-10-30 DIAGNOSIS — J9611 Chronic respiratory failure with hypoxia: Secondary | ICD-10-CM

## 2016-10-30 DIAGNOSIS — E876 Hypokalemia: Secondary | ICD-10-CM | POA: Diagnosis present

## 2016-10-30 DIAGNOSIS — E871 Hypo-osmolality and hyponatremia: Secondary | ICD-10-CM | POA: Diagnosis present

## 2016-10-30 DIAGNOSIS — C3490 Malignant neoplasm of unspecified part of unspecified bronchus or lung: Secondary | ICD-10-CM

## 2016-10-30 DIAGNOSIS — L309 Dermatitis, unspecified: Secondary | ICD-10-CM | POA: Diagnosis not present

## 2016-10-30 DIAGNOSIS — E860 Dehydration: Secondary | ICD-10-CM | POA: Diagnosis not present

## 2016-10-30 DIAGNOSIS — L03115 Cellulitis of right lower limb: Secondary | ICD-10-CM | POA: Diagnosis present

## 2016-10-30 DIAGNOSIS — Z803 Family history of malignant neoplasm of breast: Secondary | ICD-10-CM | POA: Diagnosis not present

## 2016-10-30 DIAGNOSIS — C3492 Malignant neoplasm of unspecified part of left bronchus or lung: Secondary | ICD-10-CM | POA: Diagnosis present

## 2016-10-30 DIAGNOSIS — R197 Diarrhea, unspecified: Secondary | ICD-10-CM

## 2016-10-30 DIAGNOSIS — Z9221 Personal history of antineoplastic chemotherapy: Secondary | ICD-10-CM | POA: Diagnosis not present

## 2016-10-30 DIAGNOSIS — E43 Unspecified severe protein-calorie malnutrition: Secondary | ICD-10-CM | POA: Diagnosis present

## 2016-10-30 DIAGNOSIS — R21 Rash and other nonspecific skin eruption: Secondary | ICD-10-CM

## 2016-10-30 DIAGNOSIS — L308 Other specified dermatitis: Secondary | ICD-10-CM | POA: Diagnosis not present

## 2016-10-30 DIAGNOSIS — M797 Fibromyalgia: Secondary | ICD-10-CM | POA: Diagnosis not present

## 2016-10-30 DIAGNOSIS — I10 Essential (primary) hypertension: Secondary | ICD-10-CM | POA: Diagnosis not present

## 2016-10-30 DIAGNOSIS — K219 Gastro-esophageal reflux disease without esophagitis: Secondary | ICD-10-CM | POA: Diagnosis not present

## 2016-10-30 DIAGNOSIS — Z8249 Family history of ischemic heart disease and other diseases of the circulatory system: Secondary | ICD-10-CM | POA: Diagnosis not present

## 2016-10-30 DIAGNOSIS — Z833 Family history of diabetes mellitus: Secondary | ICD-10-CM | POA: Diagnosis not present

## 2016-10-30 DIAGNOSIS — T451X5A Adverse effect of antineoplastic and immunosuppressive drugs, initial encounter: Secondary | ICD-10-CM | POA: Diagnosis present

## 2016-10-30 LAB — COMPREHENSIVE METABOLIC PANEL
ALT: 9 U/L — ABNORMAL LOW (ref 14–54)
ANION GAP: 6 (ref 5–15)
AST: 16 U/L (ref 15–41)
Albumin: 2.4 g/dL — ABNORMAL LOW (ref 3.5–5.0)
Alkaline Phosphatase: 41 U/L (ref 38–126)
BILIRUBIN TOTAL: 1 mg/dL (ref 0.3–1.2)
BUN: 8 mg/dL (ref 6–20)
CO2: 25 mmol/L (ref 22–32)
Calcium: 7.7 mg/dL — ABNORMAL LOW (ref 8.9–10.3)
Chloride: 104 mmol/L (ref 101–111)
Creatinine, Ser: 0.76 mg/dL (ref 0.44–1.00)
GFR calc Af Amer: >60 mL/min (ref >60)
GFR calc non Af Amer: >60 mL/min (ref >60)
Glucose, Bld: 94 mg/dL (ref 65–99)
POTASSIUM: 4.6 mmol/L (ref 3.5–5.1)
Sodium: 135 mmol/L (ref 135–145)
TOTAL PROTEIN: 5.9 g/dL — AB (ref 6.5–8.1)

## 2016-10-30 LAB — CBC
HEMATOCRIT: 31 % — AB (ref 36.0–46.0)
HEMOGLOBIN: 10.2 g/dL — AB (ref 12.0–15.0)
MCH: 29.1 pg (ref 26.0–34.0)
MCHC: 32.9 g/dL (ref 30.0–36.0)
MCV: 88.3 fL (ref 78.0–100.0)
Platelets: 535 10*3/uL — ABNORMAL HIGH (ref 150–400)
RBC: 3.51 MIL/uL — ABNORMAL LOW (ref 3.87–5.11)
RDW: 14.6 % (ref 11.5–15.5)
WBC: 10.4 10*3/uL (ref 4.0–10.5)

## 2016-10-30 LAB — GASTROINTESTINAL PANEL BY PCR, STOOL (REPLACES STOOL CULTURE)
ADENOVIRUS F40/41: NOT DETECTED
Astrovirus: NOT DETECTED
CRYPTOSPORIDIUM: NOT DETECTED
CYCLOSPORA CAYETANENSIS: NOT DETECTED
Campylobacter species: NOT DETECTED
ENTEROAGGREGATIVE E COLI (EAEC): NOT DETECTED
ENTEROPATHOGENIC E COLI (EPEC): NOT DETECTED
Entamoeba histolytica: NOT DETECTED
Enterotoxigenic E coli (ETEC): NOT DETECTED
GIARDIA LAMBLIA: NOT DETECTED
Norovirus GI/GII: NOT DETECTED
Plesimonas shigelloides: NOT DETECTED
Rotavirus A: NOT DETECTED
Salmonella species: NOT DETECTED
Sapovirus (I, II, IV, and V): NOT DETECTED
Shiga like toxin producing E coli (STEC): NOT DETECTED
Shigella/Enteroinvasive E coli (EIEC): NOT DETECTED
VIBRIO CHOLERAE: NOT DETECTED
VIBRIO SPECIES: NOT DETECTED
YERSINIA ENTEROCOLITICA: NOT DETECTED

## 2016-10-30 MED ORDER — BOOST PLUS PO LIQD
237.0000 mL | Freq: Two times a day (BID) | ORAL | Status: DC
  Filled 2016-10-30 (×6): qty 237

## 2016-10-30 MED ORDER — CLINDAMYCIN PHOSPHATE 600 MG/50ML IV SOLN
600.0000 mg | Freq: Three times a day (TID) | INTRAVENOUS | Status: DC
  Administered 2016-10-30 – 2016-11-01 (×5): 600 mg via INTRAVENOUS
  Filled 2016-10-30 (×5): qty 50

## 2016-10-30 NOTE — Plan of Care (Signed)
Problem: Skin Integrity: Goal: Risk for impaired skin integrity will decrease Outcome: Not Progressing Patient has a rash of upper and lower extremities   Problem: Tissue Perfusion: Goal: Risk factors for ineffective tissue perfusion will decrease Monitoring of rash on lower and upper extremities

## 2016-10-30 NOTE — Progress Notes (Signed)
Initial Nutrition Assessment  DOCUMENTATION CODES:   Severe malnutrition in context of acute illness/injury, Obesity unspecified  INTERVENTION:  Discussed the importance of adequate calories and protein to meet increased needs and reviewed foods patient may be able to tolerate.  Provide Boost Plus po BID mixed with ice cream to make milk shake. Each Boost Plus provides 360 kcal and 14 grams protein.  NUTRITION DIAGNOSIS:   Increased nutrient needs related to catabolic illness, cancer and cancer related treatments as evidenced by estimated needs.  GOAL:   Patient will meet greater than or equal to 90% of their needs  MONITOR:   PO intake, Supplement acceptance, Labs, Weight trends, I & O's  REASON FOR ASSESSMENT:   Malnutrition Screening Tool    ASSESSMENT:   60 y.o. 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 Patient presents with intractable nausea and vomiting, diarrhea.   Spoke with patient at bedside and family members were also in the room. She reports very poor appetite for three weeks in setting of nausea/vomitting, constipation/diarrhea (reports she has both), and abdominal pain. Patient also endorses difficulty swallowing due to location of her cancer and treatment. She reports that during these past 3 weeks she has gone days without eating and that when she is able to eat a meal she has an episode of emesis directly following.  UBW 204 lbs. Patient reports she has lost 33 lbs in 3 weeks. Per chart, patient was 195 lbs on 10/12, so she has lost 16 lbs (8% body weight) over 6 weeks, which is significant for time frame.  Meal Completion: 100% per chart, which does not align with patient report. Patient is ordering well-balanced meals with adequate calories and protein, so will continue to monitor % meal completion.  Medications reviewed and  include: estradiol 2 mg daily, levothyroxine, multivitamin with minerals 2 tablets daily, NS with KCl 20 mEq/L @ 100 ml/hr.  Labs reviewed: Albumin 2.4, ALT 9, Total Protein 5.9.   Nutrition-Focused physical exam completed. Findings are no fat depletion, no muscle depletion, and no edema. Patient has painful rash over entire body, but concentrated on legs. It is red and skin is cracking.  Patient meets criteria for severe acute malnutrition in setting of 8% weight loss over 6 weeks, reported intake </=50% of estimated energy requirement for >/= 5 days.  Discussed with RN.   Diet Order:  Diet regular Room service appropriate? Yes; Fluid consistency: Thin  Skin:  Reviewed, no issues  Last BM:  10/30/2016  Height:   Ht Readings from Last 1 Encounters:  10/29/16 '5\' 4"'$  (1.626 m)    Weight:   Wt Readings from Last 1 Encounters:  10/30/16 179 lb 10.8 oz (81.5 kg)    Ideal Body Weight:  54.54 kg  BMI:  Body mass index is 30.84 kg/m.  Estimated Nutritional Needs:   Kcal:  2000-2200 (25-27 kcal/kg)  Protein:  100-120 grams (1.2-1.5 grams/kg)  Fluid:  >/= 2 L/day (25 ml/kg)  EDUCATION NEEDS:   Education needs addressed  Willey Blade, MS, RD, LDN Pager: [phone] After Hours Pager: [phone]

## 2016-10-30 NOTE — Progress Notes (Signed)
Subjective: The patient is seen and examined today. She is feeling much better after the admission yesterday. She is pleasant 60 years old white female who was recently diagnosed with limited stage small cell lung cancer and underwent 1 cycle of systemic chemotherapy with cisplatin and etoposide. She has a diagnosis first cycle of the chemotherapy with significant nausea, vomiting, dehydration as well as multiple macular rash and erythema over her body more in the upper and lower extremities. She was started on treatment with doxycycline 100 mg by mouth twice a day for 10 days in addition to [place] for an outpatient basis. She had initial improvement in her condition and were she was taking the medication but she started getting worse again and was admitted to the hospital yesterday with abdominal discomfort, diarrhea as well as nausea and vomiting. She was started on IV hydration and Zofran IV. She is much better today.  Objective: Vital signs in last 24 hours: Temp:  [97.7 F (36.5 C)-99.1 F (37.3 C)] 97.7 F (36.5 C) (11/29 1521) Pulse Rate:  [77-83] 77 (11/29 1521) Resp:  [16] 16 (11/29 1521) BP: (116-125)/(68-74) 117/68 (11/29 1521) SpO2:  [93 %-96 %] 96 % (11/29 1521) Weight:  [179 lb 10.8 oz (81.5 kg)] 179 lb 10.8 oz (81.5 kg) (11/29 0141)  Intake/Output from previous day: 11/28 0701 - 11/29 0700 In: 1610 [P.O.:240; I.V.:1345] Out: -  Intake/Output this shift: Total I/O In: 1176.7 [P.O.:240; I.V.:936.7] Out: -   General appearance: alert, cooperative, fatigued and no distress Resp: clear to auscultation bilaterally Cardio: regular rate and rhythm, S1, S2 normal, no murmur, click, rub or gallop GI: soft, non-tender; bowel sounds normal; no masses,  no organomegaly Extremities: extremities normal, atraumatic, no cyanosis or edema  Lab Results:   Recent Labs  10/29/16 0949 10/30/16 0409  WBC 13.9* 10.4  HGB 12.7 10.2*  HCT 38.3 31.0*  PLT 599* 535*    BMET  Recent Labs  10/29/16 0949 10/30/16 0409  NA 134* 135  K 3.4* 4.6  CL 96* 104  CO2 25 25  GLUCOSE 126* 94  BUN 19 8  CREATININE 1.02* 0.76  CALCIUM 8.7* 7.7*    Studies/Results: No results found.  Medications: I have reviewed the patient's current medications.   Assessment/Plan: 1) Limited stage small cell lung cancer: Status post 1 cycle of systemic chemotherapy with cisplatin and etoposide. This treatment was discontinued secondary to intolerance with significant fatigue and weakness as well as nausea vomiting and dehydration. I discussed with the patient to changing her treatment regimen to carboplatin and etoposide which would be better tolerated that the cisplatin regimen. She is expected to start the first cycle of this treatment once her condition is better. 2) multifocal erythematous skin rash: etiology is unclear. It could be secondary to her systemic chemotherapy which is unusual but other etiologies cannot be excluded at this point. I would strongly recommend punch biopsy of one of these lesion and evaluation by dermatology. We will continue the current treatment with clindamycin for now. 3) nausea, vomiting, diarrhea and dehydration: Much better after starting the IV hydration with IV Zofran. Thank you so much for taking good care of [name]. Please call if you have any questions.   LOS: 0 days    Rebekah Henderson K. 10/30/2016

## 2016-10-30 NOTE — Progress Notes (Signed)
Patient ID: Rebekah Henderson, female   DOB: 03/04/1956, 60 y.o.   MRN: [id number]    PROGRESS NOTE    Rebekah Henderson  GTX:[id number] DOB: Jun 22, 1956 DOA: 10/29/2016  PCP: Glo Herring., MD   Outpatient Specialists: Dr. Julien Nordmann (oncologist)  Brief Narrative:  60 y.o. female with medical history significant of IBS, fibromyalgia, COPD, hypothyroidism, GERD, and recent diagnosis small cell carcinoma of the left lung, started on chemotherapy and XRT. Followed by Dr. Julien Nordmann. Pt presented for evaluation of progressive watery diarrhea and lower extremity edema and erythema 1 week in duration.  Assessment & Plan: Intractable nausea and vomiting - unclear etiology, ? Side effect of chemo  - improving, continue Zofran as needed for now   Diarrhea - ? If related to chemo  - stool panel so far negative  - pt says she has not had any diarrhea since arrival to the unit   Limited stage small cell lung cancer - Status post 1 cycle of systemic chemotherapy with cisplatin and etoposide - This treatment was discontinued secondary to intolerance with significant fatigue and weakness as well as N/V, dehydration  - appreciate Dr. Worthy Flank input and assistance   Multifocal erythematous skin rash - etiology is unclear, ? could be secondary to her systemic chemotherapy which is unusual - suspicious for cellulitis, will start on IV clindamycin  - per Dr. Julien Nordmann, strongly recommend punch biopsy of one of these lesion and evaluation by dermatology - I have tried to get dermatology to see pt here but unfortunately this will have to be done in an outpatient setting  - continue Clindamycin for now and reassess in AM - may consider trial of steroids to see if this will help   COPD (chronic obstructive pulmonary disease) ([place]) - Stable continue albuterol - no exacerbation at this time   Fibromyalgia - Stable continue chronic pain medication regimen  GERD (gastroesophageal reflux disease) - Favor  discontinuing PPI given problems listed above particularly diarrhea.  Hypokalemia - secondary to diarrhea and vomiting - supplemented and WNL this AM   Hyponatremia  - pre renal in etiology, resolved with IVF - BMP In AM  Obesity   - Body mass index is 30.84 kg/m.  DVT prophylaxis: Heparin SQ Code Status: Full  Family Communication: Patient at bedside  Disposition Plan: Home   Consultants:   Oncology   Procedures:   None  Antimicrobials:   Clindamycin 11/29 -->  Subjective: No events overnight.   Objective: Vitals:   10/29/16 2059 10/30/16 0141 10/30/16 0553 10/30/16 1521  BP: 125/74  116/71 117/68  Pulse: 83  79 77  Resp: '16  16 16  '$ Temp: 99.1 F (37.3 C)  98 F (36.7 C) 97.7 F (36.5 C)  TempSrc: Oral  Oral Oral  SpO2: 95%  93% 96%  Weight:  81.5 kg (179 lb 10.8 oz)    Height:        Intake/Output Summary (Last 24 hours) at 10/30/16 1830 Last data filed at 10/30/16 1517  Gross per 24 hour  Intake          2585.01 ml  Output                0 ml  Net          2585.01 ml   Filed Weights   10/29/16 1343 10/30/16 0141  Weight: 82.2 kg (181 lb 3.5 oz) 81.5 kg (179 lb 10.8 oz)    Examination:  General exam: Appears calm and comfortable  Respiratory system: Clear to auscultation. Respiratory effort normal. Cardiovascular system: S1 & S2 heard, RRR. No JVD, murmurs, rubs, gallops or clicks. No pedal edema. Gastrointestinal system: Abdomen is nondistended, soft and nontender. No organomegaly or masses felt.  Central nervous system: Alert and oriented. No focal neurological deficits. Extremities: Symmetric 5 x 5 power. Skin: Bilateral LE erythema on ant shins, warm to touch and TTP  Data Reviewed: I have personally reviewed following labs and imaging studies  CBC:  Recent Labs Lab 10/29/16 0949 10/30/16 0409  WBC 13.9* 10.4  NEUTROABS 11.6*  --   HGB 12.7 10.2*  HCT 38.3 31.0*  MCV 86.7 88.3  PLT 599* 144*   Basic Metabolic  Panel:  Recent Labs Lab 10/29/16 0949 10/30/16 0409  NA 134* 135  K 3.4* 4.6  CL 96* 104  CO2 25 25  GLUCOSE 126* 94  BUN 19 8  CREATININE 1.02* 0.76  CALCIUM 8.7* 7.7*  MG 2.2  --   PHOS 2.9  --    Liver Function Tests:  Recent Labs Lab 10/29/16 0949 10/30/16 0409  AST 12* 16  ALT 9* 9*  ALKPHOS 55 41  BILITOT 0.9 1.0  PROT 7.7 5.9*  ALBUMIN 3.2* 2.4*    Recent Labs Lab 10/29/16 0949  LIPASE 15   Thyroid Function Tests:  Recent Labs  10/29/16 0949  TSH 1.485   Urine analysis:    Component Value Date/Time   COLORURINE AMBER (A) 10/19/2016 1256   APPEARANCEUR CLOUDY (A) 10/19/2016 1256   LABSPEC 1.022 10/19/2016 1256   PHURINE 6.5 10/19/2016 1256   GLUCOSEU NEGATIVE 10/19/2016 1256   HGBUR NEGATIVE 10/19/2016 1256   BILIRUBINUR MODERATE (A) 10/19/2016 1256   KETONESUR 40 (A) 10/19/2016 1256   PROTEINUR NEGATIVE 10/19/2016 1256   UROBILINOGEN 0.2 03/10/2014 1027   NITRITE NEGATIVE 10/19/2016 1256   LEUKOCYTESUR NEGATIVE 10/19/2016 1256    Recent Results (from the past 240 hour(s))  Gastrointestinal Panel by PCR , Stool     Status: None   Collection Time: 10/30/16  8:21 AM  Result Value Ref Range Status   Campylobacter species NOT DETECTED NOT DETECTED Final   Plesimonas shigelloides NOT DETECTED NOT DETECTED Final   Salmonella species NOT DETECTED NOT DETECTED Final   Yersinia enterocolitica NOT DETECTED NOT DETECTED Final   Vibrio species NOT DETECTED NOT DETECTED Final   Vibrio cholerae NOT DETECTED NOT DETECTED Final   Enteroaggregative E coli (EAEC) NOT DETECTED NOT DETECTED Final   Enteropathogenic E coli (EPEC) NOT DETECTED NOT DETECTED Final   Enterotoxigenic E coli (ETEC) NOT DETECTED NOT DETECTED Final   Shiga like toxin producing E coli (STEC) NOT DETECTED NOT DETECTED Final   Shigella/Enteroinvasive E coli (EIEC) NOT DETECTED NOT DETECTED Final   Cryptosporidium NOT DETECTED NOT DETECTED Final   Cyclospora cayetanensis NOT DETECTED  NOT DETECTED Final   Entamoeba histolytica NOT DETECTED NOT DETECTED Final   Giardia lamblia NOT DETECTED NOT DETECTED Final   Adenovirus F40/41 NOT DETECTED NOT DETECTED Final   Astrovirus NOT DETECTED NOT DETECTED Final   Norovirus GI/GII NOT DETECTED NOT DETECTED Final   Rotavirus A NOT DETECTED NOT DETECTED Final   Sapovirus (I, II, IV, and V) NOT DETECTED NOT DETECTED Final    Radiology Studies: No results found.  Scheduled Meds: . aspirin EC  81 mg Oral QODAY  . clindamycin (CLEOCIN) IV  600 mg Intravenous Q8H  . estradiol  2 mg Oral Daily  . FLUoxetine  40 mg Oral Daily  .  heparin  5,000 Units Subcutaneous Q8H  . [START ON 10/31/2016] lactose free nutrition  237 mL Oral BID BM  . levothyroxine  25 mcg Oral QAC breakfast  . metoprolol succinate  25 mg Oral Daily  . multivitamin with minerals  2 tablet Oral Daily   Continuous Infusions: . 0.9 % NaCl with KCl 20 mEq / L 100 mL/hr at 10/30/16 1418    LOS: 0 days   Time spent: 20 minutes   Faye Ramsay, MD [facility] Pager [phone]  If 7PM-7AM, please contact night-coverage [email or web address] Password [email or web address] 10/30/2016, 6:30 PM

## 2016-10-30 NOTE — Care Management Obs Status (Signed)
[place] NOTIFICATION   Patient Details  Name: NUSAIBA GUALLPA MRN: [id number] Date of Birth: Jan 22, 1956   Medicare Observation Status Notification Given:  Yes    Lynnell Catalan, RN 10/30/2016, 3:25 PM

## 2016-10-31 DIAGNOSIS — M797 Fibromyalgia: Secondary | ICD-10-CM

## 2016-10-31 DIAGNOSIS — I10 Essential (primary) hypertension: Secondary | ICD-10-CM

## 2016-10-31 LAB — BASIC METABOLIC PANEL
Anion gap: 6 (ref 5–15)
BUN: 7 mg/dL (ref 6–20)
CALCIUM: 7.8 mg/dL — AB (ref 8.9–10.3)
CHLORIDE: 108 mmol/L (ref 101–111)
CO2: 23 mmol/L (ref 22–32)
CREATININE: 0.7 mg/dL (ref 0.44–1.00)
GFR calc non Af Amer: >60 mL/min (ref >60)
Glucose, Bld: 88 mg/dL (ref 65–99)
Potassium: 3.8 mmol/L (ref 3.5–5.1)
SODIUM: 137 mmol/L (ref 135–145)

## 2016-10-31 LAB — CBC
HCT: 29.4 % — ABNORMAL LOW (ref 36.0–46.0)
Hemoglobin: 9.8 g/dL — ABNORMAL LOW (ref 12.0–15.0)
MCH: 29.4 pg (ref 26.0–34.0)
MCHC: 33.3 g/dL (ref 30.0–36.0)
MCV: 88.3 fL (ref 78.0–100.0)
Platelets: 524 10*3/uL — ABNORMAL HIGH (ref 150–400)
RBC: 3.33 MIL/uL — ABNORMAL LOW (ref 3.87–5.11)
RDW: 14.8 % (ref 11.5–15.5)
WBC: 8.6 10*3/uL (ref 4.0–10.5)

## 2016-10-31 MED ORDER — PREDNISONE 50 MG PO TABS
50.0000 mg | ORAL_TABLET | Freq: Every day | ORAL | Status: DC
  Administered 2016-11-01 – 2016-11-02 (×2): 50 mg via ORAL
  Filled 2016-10-31 (×2): qty 1

## 2016-10-31 NOTE — Progress Notes (Signed)
PT Cancellation Note  Patient Details Name: Rebekah Henderson MRN: [id number] DOB: [birth year]   Cancelled Treatment:    Reason Eval/Treat Not Completed: Medical issues which prohibited therapy (feels nauseated)   Claretha Cooper 10/31/2016, 4:23 PM Tresa Endo PT [phone]

## 2016-10-31 NOTE — Progress Notes (Signed)
Patient ID: Rebekah Henderson, female   DOB: 18-Feb-1956, 60 y.o.   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 Julien Nordmann.  Pt presented for evaluation of progressive watery diarrhea, nausea  and vomiting, and lower extremity edema and erythema 1 week in duration.  Assessment & Plan:  Intractable nausea and vomiting - Possibly side of chemotherapy  - Improved   Diarrhea - Possibly related to side of chemotherapy - Her chemo infusion was 10/04/2016 - 1 BM in past 24 hours  - Stool panel negative   Limited stage small cell lung cancer - Status post 1 cycle of systemic chemotherapy with cisplatin and etoposide - This treatment was discontinued secondary to intolerance with significant fatigue and weakness as well as N/V, dehydration  - We appreciate Dr. Worthy Flank input and assistance   Multifocal erythematous skin rash - Etiology is unclear, ? could be secondary to her systemic chemotherapy  - Suspicious for cellulitis - On clindamycin  - Per Dr. Julien Nordmann, strongly recommend punch biopsy of one of these lesion and evaluation by dermatology - Have tried to get dermatology to see pt here but unfortunately this will have to be done in an outpatient setting  - Will ask for [place] assessment - Also, started prednisone to see if any improvement  Essential hypertension - Continue metoprolol   COPD (chronic obstructive pulmonary disease) ([place]) - Stable, not in acute exacerbation   Fibromyalgia / Depression - Continue pain management efforts  - Continue fluoxetine   Hypothyroidism - Continue levothyroxine   GERD (gastroesophageal reflux disease) - Stopped PPI due to diarrhea    Hypokalemia - Secondary to GI losses - Supplemented - Check BMP in am  Hyponatremia  - Pre renal in etiology, resolved with IVF  Obesity   - Body mass index is 30.84 kg/m   DVT prophylaxis: Heparin SQ Code Status: Full  Family Communication: No family at the bedside this am Disposition Plan: Home once lower extremity redness better  Consultants:   Oncology   WOC  Procedures:   None  Antimicrobials:   Clindamycin 11/29 -->    Subjective: Pt reports LE redness worse this am.  Objective: Vitals:   10/30/16 1521 10/30/16 2147 10/31/16 0601 10/31/16 1343  BP: 117/68 115/78 109/60 (!) 105/51  Pulse: 77 73 74 76  Resp: '16 18 18 16  '$ Temp: 97.7 F (36.5 C) 98 F (36.7 C) 97.8 F (36.6 C) 97.6 F (36.4 C)  TempSrc: Oral Oral Oral Oral  SpO2: 96% 97% 97% 98%  Weight:      Height:        Intake/Output Summary (Last 24 hours) at 10/31/16 1754 Last data filed at 10/31/16 1608  Gross per 24 hour  Intake          2678.33 ml  Output                0 ml  Net          2678.33 ml   Filed Weights   10/29/16 1343 10/30/16 0141  Weight: 82.2 kg (181 lb 3.5 oz) 81.5 kg (179 lb 10.8 oz)    Examination:  General exam: Appears calm and comfortable  Respiratory system: Clear to auscultation. Respiratory effort normal.  Cardiovascular system: S1 & S2 heard, RRR.  Gastrointestinal system: Abdomen is nondistended, soft and nontender. No organomegaly or masses felt. Normal bowel sounds heard. Central nervous system: Alert and oriented. No focal neurological deficits. Extremities: Symmetric 5 x 5 power. +1 LE edema  Skin: LE bilaterally with excoriations, scaling and weeping more so than the rash.  Psychiatry: Judgement and insight appear normal. Mood & affect appropriate.   Data Reviewed: I have personally reviewed following labs and imaging studies  CBC:  Recent Labs Lab 10/29/16 0949 10/30/16 0409 10/31/16 0403  WBC 13.9* 10.4 8.6  NEUTROABS 11.6*   --   --   HGB 12.7 10.2* 9.8*  HCT 38.3 31.0* 29.4*  MCV 86.7 88.3 88.3  PLT 599* 535* 751*   Basic Metabolic Panel:  Recent Labs Lab 10/29/16 0949 10/30/16 0409 10/31/16 0403  NA 134* 135 137  K 3.4* 4.6 3.8  CL 96* 104 108  CO2 '25 25 23  '$ GLUCOSE 126* 94 88  BUN '19 8 7  '$ CREATININE 1.02* 0.76 0.70  CALCIUM 8.7* 7.7* 7.8*  MG 2.2  --   --   PHOS 2.9  --   --    GFR: Estimated Creatinine Clearance: 75.3 mL/min (by C-G formula based on SCr of 0.7 mg/dL). Liver Function Tests:  Recent Labs Lab 10/29/16 0949 10/30/16 0409  AST 12* 16  ALT 9* 9*  ALKPHOS 55 41  BILITOT 0.9 1.0  PROT 7.7 5.9*  ALBUMIN 3.2* 2.4*    Recent Labs Lab 10/29/16 0949  LIPASE 15   No results for input(s): AMMONIA in the last 168 hours. Coagulation Profile: No results for input(s): INR, PROTIME in the last 168 hours. Cardiac Enzymes: No results for input(s): CKTOTAL, CKMB, CKMBINDEX, TROPONINI in the last 168 hours. BNP (last 3 results) No results for input(s): PROBNP in the last 8760 hours. HbA1C: No results for input(s): HGBA1C in the last 72 hours. CBG: No results for input(s): GLUCAP in the last 168 hours. Lipid Profile: No results for input(s): CHOL, HDL, LDLCALC, TRIG, CHOLHDL, LDLDIRECT in the last 72 hours. Thyroid Function Tests:  Recent Labs  10/29/16 0949  TSH 1.485   Anemia Panel: No results for input(s): VITAMINB12, FOLATE, FERRITIN, TIBC, IRON, RETICCTPCT in the last 72 hours. Urine analysis:    Component Value Date/Time   COLORURINE AMBER (A) 10/19/2016 1256   APPEARANCEUR CLOUDY (A) 10/19/2016 1256   LABSPEC 1.022 10/19/2016 1256   PHURINE 6.5 10/19/2016 1256   GLUCOSEU NEGATIVE 10/19/2016 1256   HGBUR NEGATIVE 10/19/2016 1256   BILIRUBINUR MODERATE (A) 10/19/2016 1256   KETONESUR 40 (A) 10/19/2016 1256   PROTEINUR NEGATIVE 10/19/2016 1256   UROBILINOGEN 0.2 03/10/2014 1027   NITRITE NEGATIVE 10/19/2016 1256   LEUKOCYTESUR NEGATIVE 10/19/2016 1256    Sepsis Labs: '@LABRCNTIP'$ (procalcitonin:4,lacticidven:4)    Recent Results (from the past 240 hour(s))  Gastrointestinal Panel by PCR , Stool     Status: None   Collection Time: 10/30/16  8:21 AM  Result Value Ref Range Status   Campylobacter species NOT DETECTED NOT DETECTED Final   Plesimonas shigelloides NOT DETECTED NOT DETECTED Final   Salmonella species NOT DETECTED NOT DETECTED Final   Yersinia enterocolitica NOT DETECTED NOT DETECTED Final   Vibrio species NOT DETECTED NOT DETECTED Final   Vibrio cholerae NOT DETECTED NOT DETECTED Final   Enteroaggregative E coli (EAEC) NOT DETECTED NOT DETECTED Final   Enteropathogenic E coli (EPEC) NOT DETECTED NOT DETECTED Final   Enterotoxigenic E coli (ETEC) NOT DETECTED  NOT DETECTED Final   Shiga like toxin producing E coli (STEC) NOT DETECTED NOT DETECTED Final   Shigella/Enteroinvasive E coli (EIEC) NOT DETECTED NOT DETECTED Final   Cryptosporidium NOT DETECTED NOT DETECTED Final   Cyclospora cayetanensis NOT DETECTED NOT DETECTED Final   Entamoeba histolytica NOT DETECTED NOT DETECTED Final   Giardia lamblia NOT DETECTED NOT DETECTED Final   Adenovirus F40/41 NOT DETECTED NOT DETECTED Final   Astrovirus NOT DETECTED NOT DETECTED Final   Norovirus GI/GII NOT DETECTED NOT DETECTED Final   Rotavirus A NOT DETECTED NOT DETECTED Final   Sapovirus (I, II, IV, and V) NOT DETECTED NOT DETECTED Final      Radiology Studies: No results found.   Scheduled Meds: . aspirin EC  81 mg Oral QODAY  . clindamycin (CLEOCIN) IV  600 mg Intravenous Q8H  . estradiol  2 mg Oral Daily  . FLUoxetine  40 mg Oral Daily  . heparin  5,000 Units Subcutaneous Q8H  . lactose free nutrition  237 mL Oral BID BM  . levothyroxine  25 mcg Oral QAC breakfast  . metoprolol succinate  25 mg Oral Daily  . multivitamin with minerals  2 tablet Oral Daily  . [START ON 11/01/2016] predniSONE  50 mg Oral Q breakfast   Continuous Infusions: . 0.9 % NaCl with KCl  20 mEq / L 75 mL/hr at 10/31/16 1332     LOS: 1 day    Time spent: 25 minutes  Greater than 50% of the time spent on counseling and coordinating the care.   Leisa Lenz, MD [facility] Pager [phone]  If 7PM-7AM, please contact night-coverage [email or web address] Password [email or web address] 10/31/2016, 5:54 PM

## 2016-10-31 NOTE — Consult Note (Signed)
[facility] Nurse wound consult note Reason for Consult:skin changes with onset of chemotherapy 3 weeks ago.  Changes include erythema, edema warmth.  Patient states LEs are weak and that she requires a walker to go to the bathroom. 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  Right arm. Left wrist. Wound HYI:FOYD Drainage (amount, consistency, odor) None Periwound:Dry, scaly Dressing procedure/placement/frequency: This consultation exceeds the scope of [place]; I am able to implement a conservative POC involving gentle cleansing with a pH balanced, no rinse and few ingredient cleanser followed by twice daily application of an emollient (Eucerin), but other than that, since the etiology seems to be her chemotherapeutic agent, I must defer to her oncology team or to outpatient dermatology.  [place] nursing team will not follow, but will remain available to this patient, the nursing and medical teams.  Please re-consult if needed. Thanks, Maudie Flakes, MSN, RN, [place], [facility]  Pager# [phone]

## 2016-11-01 DIAGNOSIS — C3492 Malignant neoplasm of unspecified part of left bronchus or lung: Secondary | ICD-10-CM

## 2016-11-01 DIAGNOSIS — K219 Gastro-esophageal reflux disease without esophagitis: Secondary | ICD-10-CM

## 2016-11-01 LAB — BASIC METABOLIC PANEL
ANION GAP: 6 (ref 5–15)
BUN: <5 mg/dL — ABNORMAL LOW (ref 6–20)
CALCIUM: 7.9 mg/dL — AB (ref 8.9–10.3)
CO2: 25 mmol/L (ref 22–32)
CREATININE: 0.75 mg/dL (ref 0.44–1.00)
Chloride: 104 mmol/L (ref 101–111)
Glucose, Bld: 98 mg/dL (ref 65–99)
Potassium: 3.9 mmol/L (ref 3.5–5.1)
SODIUM: 135 mmol/L (ref 135–145)

## 2016-11-01 LAB — CBC
HCT: 27.4 % — ABNORMAL LOW (ref 36.0–46.0)
HEMOGLOBIN: 9 g/dL — AB (ref 12.0–15.0)
MCH: 28.8 pg (ref 26.0–34.0)
MCHC: 32.8 g/dL (ref 30.0–36.0)
MCV: 87.5 fL (ref 78.0–100.0)
PLATELETS: 478 10*3/uL — AB (ref 150–400)
RBC: 3.13 MIL/uL — AB (ref 3.87–5.11)
RDW: 14.9 % (ref 11.5–15.5)
WBC: 9 10*3/uL (ref 4.0–10.5)

## 2016-11-01 LAB — OVA + PARASITE EXAM

## 2016-11-01 LAB — O&P RESULT

## 2016-11-01 MED ORDER — LIDOCAINE HCL (PF) 2 % IJ SOLN
0.0000 mL | Freq: Once | INTRAMUSCULAR | Status: DC | PRN
  Filled 2016-11-01: qty 20

## 2016-11-01 MED ORDER — PROMETHAZINE HCL 25 MG/ML IJ SOLN
12.5000 mg | INTRAMUSCULAR | Status: DC | PRN
  Administered 2016-11-01: 25 mg via INTRAVENOUS
  Administered 2016-11-01: 12.5 mg via INTRAVENOUS
  Filled 2016-11-01 (×2): qty 1

## 2016-11-01 MED ORDER — BACITRACIN ZINC 500 UNIT/GM EX OINT
TOPICAL_OINTMENT | CUTANEOUS | Status: DC | PRN
  Filled 2016-11-01: qty 0.9

## 2016-11-01 MED ORDER — ENOXAPARIN SODIUM 40 MG/0.4ML ~~LOC~~ SOLN
40.0000 mg | SUBCUTANEOUS | Status: DC
  Administered 2016-11-01: 40 mg via SUBCUTANEOUS
  Filled 2016-11-01: qty 0.4

## 2016-11-01 NOTE — Progress Notes (Signed)
Progress Note    CAMALA TALWAR  EYC:[id number] DOB: [birth year]  DOA: 10/29/2016 PCP: Glo Herring., MD    Brief Narrative:   Chief complaint: Follow-up nausea/vomiting  Rebekah Henderson is an 60 y.o. 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 Julien Nordmann. Pt presented for evaluation of progressive watery diarrhea, nausea  and vomiting, and lower extremity edema and erythema 1 week in duration.  Assessment/Plan:   Principal problem:  Intractable nausea and vomiting - Continues to be extremely nauseated with dry heaves.  - Continue Zofran as needed, and add Phenergan for breakthrough nausea/vomiting.  Active problems:  Diarrhea - Possibly related to side of chemotherapy, although she has been on clindamycin for suspected cellulitis. - Her chemo infusion was 10/04/2016. - Stool studies negative. Stop clindamycin.  Limited stage small cell lung cancer - Status post 1 cycle of systemic chemotherapy with cisplatin and etoposide. - Chemotherapy discontinued secondary to intolerance with significant fatigue and weakness as well as N/V, dehydration.  - Evaluated by oncologist while in hospital.   Multifocal erythematous skin rash - Etiology is unclear, ? could be secondary to her systemic chemotherapy.  - Very unlikely to be cellulitis given that this is affecting multiple areas of the skin discontinuously. - Stop clindamycin. - Discussed case with general surgery with plans to proceed with punch biopsy. - Prednisone started 11/01/16. Continue pending biopsy results.  Essential hypertension - Continue metoprolol.   COPD (chronic obstructive pulmonary disease) (HCC)/chronic respiratory failure - Stable, not in acute exacerbation. Resume nocturnal oxygen as she takes at home.  Fibromyalgia / Depression - Continue pain  management efforts.  - Continue fluoxetine.   Hypothyroidism - Continue levothyroxine.   GERD (gastroesophageal reflux disease) - PPI on hold secondary to diarrhea.   Hypokalemia - Continue to supplement as needed.  Hyponatremia  - Pre renal in etiology, resolved with IVF.  Obesity  - Body mass index is 30.84 kg/m.  Family Communication/Anticipated D/C date and plan/Code Status   DVT prophylaxis: Lovenox ordered. Code Status: Full Code.  Family Communication: No family at the bedside. Disposition Plan: Continues to require inpatient management with ongoing nausea/vomiting.   Medical Consultants:    Oncology   Procedures:    None  Anti-Infectives:    Clindamycin 10/30/16---> 11/01/16  Subjective:   The patient was actively experiencing dry heaves when I visited her today. Review of symptoms is positive for ongoing severe nausea and vomiting/dry heaves, painful skin eruption, shortness of breath and negative for cough.  Objective:    Vitals:   10/31/16 0601 10/31/16 1343 10/31/16 2124 11/01/16 0622  BP: 109/60 (!) 105/51 (!) 119/52 (!) 131/58  Pulse: 74 76 79 76  Resp: '18 16 16 18  '$ Temp: 97.8 F (36.6 C) 97.6 F (36.4 C) 98.4 F (36.9 C) 98 F (36.7 C)  TempSrc: Oral Oral Oral Oral  SpO2: 97% 98% 95% 96%  Weight:      Height:        Intake/Output Summary (Last 24 hours) at 11/01/16 0854 Last data filed at 11/01/16 1497  Gross per 24 hour  Intake             2065 ml  Output                0 ml  Net             2065  ml   Filed Weights   10/29/16 1343 10/30/16 0141  Weight: 82.2 kg (181 lb 3.5 oz) 81.5 kg (179 lb 10.8 oz)    Exam: General exam: Appears very uncomfortable, actively vomiting Respiratory system: Mild wheeze auscultated. Work of breathing mildly increased. Cardiovascular system: S1 & S2 heard, RRR. No JVD,  rubs, gallops or clicks. No murmurs. Gastrointestinal system: Abdomen is nondistended, soft and nontender. No  organomegaly or masses felt. Normal bowel sounds heard. Central nervous system: Alert and oriented. No focal neurological deficits. Extremities: No clubbing,  or cyanosis. Lower extremities as pictured below. Skin: Discontinuous raised rash most prominent on the right lower extremity but present on all 4 extremities and neck. Psychiatry: Judgement and insight appear normal. Mood & affect depressed.     Data Reviewed:   I have personally reviewed following labs and imaging studies:  Labs: Basic Metabolic Panel:  Recent Labs Lab 10/29/16 0949 10/30/16 0409 10/31/16 0403 11/01/16 0341  NA 134* 135 137 135  K 3.4* 4.6 3.8 3.9  CL 96* 104 108 104  CO2 '25 25 23 25  '$ GLUCOSE 126* 94 88 98  BUN '19 8 7 '$ <5*  CREATININE 1.02* 0.76 0.70 0.75  CALCIUM 8.7* 7.7* 7.8* 7.9*  MG 2.2  --   --   --   PHOS 2.9  --   --   --    GFR Estimated Creatinine Clearance: 75.3 mL/min (by C-G formula based on SCr of 0.75 mg/dL). Liver Function Tests:  Recent Labs Lab 10/29/16 0949 10/30/16 0409  AST 12* 16  ALT 9* 9*  ALKPHOS 55 41  BILITOT 0.9 1.0  PROT 7.7 5.9*  ALBUMIN 3.2* 2.4*    Recent Labs Lab 10/29/16 0949  LIPASE 15   No results for input(s): AMMONIA in the last 168 hours. Coagulation profile No results for input(s): INR, PROTIME in the last 168 hours.  CBC:  Recent Labs Lab 10/29/16 0949 10/30/16 0409 10/31/16 0403 11/01/16 0341  WBC 13.9* 10.4 8.6 9.0  NEUTROABS 11.6*  --   --   --   HGB 12.7 10.2* 9.8* 9.0*  HCT 38.3 31.0* 29.4* 27.4*  MCV 86.7 88.3 88.3 87.5  PLT 599* 535* 524* 478*   Cardiac Enzymes: No results for input(s): CKTOTAL, CKMB, CKMBINDEX, TROPONINI in the last 168 hours. BNP (last 3 results) No results for input(s): PROBNP in the last 8760 hours. CBG: No results for input(s): GLUCAP in the last 168 hours. D-Dimer: No results for input(s): DDIMER in the last 72 hours. Hgb A1c: No results for input(s): HGBA1C in the last 72 hours. Lipid  Profile: No results for input(s): CHOL, HDL, LDLCALC, TRIG, CHOLHDL, LDLDIRECT in the last 72 hours. Thyroid function studies:  Recent Labs  10/29/16 0949  TSH 1.485   Anemia work up: No results for input(s): VITAMINB12, FOLATE, FERRITIN, TIBC, IRON, RETICCTPCT in the last 72 hours. Sepsis Labs:  Recent Labs Lab 10/29/16 0949 10/30/16 0409 10/31/16 0403 11/01/16 0341  WBC 13.9* 10.4 8.6 9.0    Microbiology Recent Results (from the past 240 hour(s))  Gastrointestinal Panel by PCR , Stool     Status: None   Collection Time: 10/30/16  8:21 AM  Result Value Ref Range Status   Campylobacter species NOT DETECTED NOT DETECTED Final   Plesimonas shigelloides NOT DETECTED NOT DETECTED Final   Salmonella species NOT DETECTED NOT DETECTED Final   Yersinia enterocolitica NOT DETECTED NOT DETECTED Final   Vibrio species NOT DETECTED NOT DETECTED Final  Vibrio cholerae NOT DETECTED NOT DETECTED Final   Enteroaggregative E coli (EAEC) NOT DETECTED NOT DETECTED Final   Enteropathogenic E coli (EPEC) NOT DETECTED NOT DETECTED Final   Enterotoxigenic E coli (ETEC) NOT DETECTED NOT DETECTED Final   Shiga like toxin producing E coli (STEC) NOT DETECTED NOT DETECTED Final   Shigella/Enteroinvasive E coli (EIEC) NOT DETECTED NOT DETECTED Final   Cryptosporidium NOT DETECTED NOT DETECTED Final   Cyclospora cayetanensis NOT DETECTED NOT DETECTED Final   Entamoeba histolytica NOT DETECTED NOT DETECTED Final   Giardia lamblia NOT DETECTED NOT DETECTED Final   Adenovirus F40/41 NOT DETECTED NOT DETECTED Final   Astrovirus NOT DETECTED NOT DETECTED Final   Norovirus GI/GII NOT DETECTED NOT DETECTED Final   Rotavirus A NOT DETECTED NOT DETECTED Final   Sapovirus (I, II, IV, and V) NOT DETECTED NOT DETECTED Final    Radiology: No results found.  Medications:   . aspirin EC  81 mg Oral QODAY  . clindamycin (CLEOCIN) IV  600 mg Intravenous Q8H  . estradiol  2 mg Oral Daily  . FLUoxetine  40  mg Oral Daily  . heparin  5,000 Units Subcutaneous Q8H  . lactose free nutrition  237 mL Oral BID BM  . levothyroxine  25 mcg Oral QAC breakfast  . metoprolol succinate  25 mg Oral Daily  . multivitamin with minerals  2 tablet Oral Daily  . predniSONE  50 mg Oral Q breakfast   Continuous Infusions: . 0.9 % NaCl with KCl 20 mEq / L 75 mL/hr at 10/31/16 2350    Medical decision making is of high complexity and this patient is at high risk of deterioration, therefore this is a level 3 visit.   LOS: 2 days   Lavonne Cass  [facility] Pager [phone]. If unable to reach me by pager, please call my cell phone at [phone].  *Please refer to [email or web address], password [email or web address] to get updated schedule on who will round on this patient, as hospitalists switch teams weekly. If 7PM-7AM, please contact night-coverage at [email or web address], password [email or web address] for any overnight needs.  11/01/2016, 8:54 AM

## 2016-11-01 NOTE — Progress Notes (Signed)
PT Cancellation Note  Patient Details Name: Rebekah Henderson MRN: [id number] DOB: 1956-03-23   Cancelled Treatment:    Reason Eval/Treat Not Completed: Medical issues which prohibited therapy (N/V and going for bx later today)   [facility] 11/01/2016, 12:40 PM

## 2016-11-01 NOTE — Consult Note (Signed)
Reason for Consult:  Rash, nausea, vomiting and diarrhea Referring Physician: Dr. Loletha Grayer Rama    HPI: Pt admitted on 10/29/16 with abdominal pain, diarrhea, nausea and vomiting.  She also had a rash that presented about 3 weeks prior to admission. The rash started on her legs and has migrated to her arms. It has become progressively worse. Nothing made it better or worse. She has multiple medical issues as listed below.  She was recently diagnosed with Small cell lung cancer and has undergone 1 cycle of chemotherapy with cisplatin and etoposide. She has a diagnosis first cycle of the chemotherapy with significant nausea, vomiting, dehydration as well as multiple macular rash and erythema over her body more in the upper and lower extremities. She was started on treatment with doxycycline 100 mg by mouth twice a day for 10 days in addition to [place] for an outpatient basis. She had initial improvement in her condition but reported worsening of her rash along with the abdominal pain, diarrhea, nausea and vomiting.  Dr. Rockne Menghini is requesting a skin biopsy for evaluation of her current illness.  Past Medical History:  Diagnosis Date  . Anxiety    takes Prozac daily  . Arthritis   . Arthritis   . Cancer ([place])    skin, lung  . Colon polyps   . COPD (chronic obstructive pulmonary disease) ([place])   . Diverticulitis   . Dyspnea    with exertion  . Fibromyalgia   . GERD (gastroesophageal reflux disease)    takes Pantoprazole daily  . Hypertension    takes Metoprolol,Triamterene-HCTZ,and Amlodipine daily  . Hypothyroidism    takes Synthroid daily  . IBS (irritable bowel syndrome)   . PONV (postoperative nausea and vomiting)    pt also states that she had some difficulty breathing after cervical fusion    Past Surgical History:  Procedure Laterality Date  . BIOPSY N/A 05/25/2013   Procedure: BIOPSIES (Random Colon; Duodenal; Gastric);  Surgeon: Danie Binder, MD;  Location: [facility];  Service:  Endoscopy;  Laterality: N/A;  . BLADDER SUSPENSION    . BREAST ENHANCEMENT SURGERY    . BREAST IMPLANT REMOVAL    . CERVICAL FUSION  AUG 2013  . CHOLECYSTECTOMY  1999  . COLONOSCOPY  2007 [place]   POLYPS  . COLONOSCOPY WITH PROPOFOL N/A 05/25/2013   Procedure: COLONOSCOPY WITH PROPOFOL(at cecum 0957) total withdrawal time=69mn);  Surgeon: SDanie Binder MD;  Location: [facility];  Service: Endoscopy;  Laterality: N/A;  . ESOPHAGOGASTRODUODENOSCOPY (EGD) WITH PROPOFOL N/A 05/25/2013   Procedure: ESOPHAGOGASTRODUODENOSCOPY (EGD) WITH PROPOFOL;  Surgeon: SDanie Binder MD;  Location: [facility];  Service: Endoscopy;  Laterality: N/A;  . FOOT SURGERY    . POLYPECTOMY N/A 05/25/2013   Procedure: POLYPECTOMY (Rectal and Gastric);  Surgeon: SDanie Binder MD;  Location: [facility];  Service: Endoscopy;  Laterality: N/A;  . TONSILLECTOMY    . UPPER GASTROINTESTINAL ENDOSCOPY    . VIDEO BRONCHOSCOPY WITH ENDOBRONCHIAL ULTRASOUND  09/12/2016   Procedure: VIDEO BRONCHOSCOPY WITH ENDOBRONCHIAL ULTRASOUND AND BIOPSY;  Surgeon: DJuanito Doom MD;  Location: [facility];  Service: Cardiopulmonary;;    Family History  Problem Relation Age of Onset  . Breast cancer Mother   . Diabetes Maternal Grandfather   . Lung cancer Father   . Heart failure Sister 49   Died. Morbidly obese  . Colon cancer Neg Hx   . Colon polyps Neg Hx     Social History:  reports that she  quit smoking about 12 years ago. She has a 40.00 pack-year smoking history. She has never used smokeless tobacco. She reports that she does not drink alcohol or use drugs.  Allergies:  Allergies  Allergen Reactions  . Penicillins Hives and Swelling    Has patient had a PCN reaction causing immediate rash, facial/tongue/throat swelling, SOB or lightheadedness with hypotension:unsure  Has patient had a PCN reaction causing severe rash involving mucus membranes or skin necrosis:unsure Has patient had a PCN reaction that required  hospitalization:No Has patient had a PCN reaction occurring within the last 10 years:No If all of the above answers are "NO", then may proceed with Cephalosporin use.   . Codeine Nausea Only  . Ranitidine Hcl Other (See Comments)    dizzy  . Keflex [Cephalexin] Other (See Comments)    UNSPECIFIED REACTION   . Lyrica [Pregabalin] Other (See Comments)    lethargic    Medications:  Prior to Admission:  Prescriptions Prior to Admission  Medication Sig Dispense Refill Last Dose  . albuterol (PROVENTIL) 2 MG tablet Take 2 mg by mouth daily as needed for wheezing or shortness of breath.    Past Month at Unknown time  . amLODipine (NORVASC) 5 MG tablet Take 5 mg by mouth daily.     10/28/2016 at Unknown time  . aspirin EC 81 MG tablet Take 81 mg by mouth every other day.    10/28/2016 at Unknown time  . doxycycline (VIBRA-TABS) 100 MG tablet Take 1 tablet (100 mg total) by mouth 2 (two) times daily. 20 tablet 0 10/28/2016 at Unknown time  . estazolam (PROSOM) 2 MG tablet Take 2 mg by mouth at bedtime.   10/28/2016 at Unknown time  . estradiol (ESTRACE) 2 MG tablet Take 2 mg by mouth daily.     10/28/2016 at Unknown time  . FLUoxetine (PROZAC) 40 MG capsule Take 40 mg by mouth daily.   2 10/28/2016 at Unknown time  . HYDROcodone-acetaminophen (NORCO/VICODIN) 5-325 MG tablet Take 1 tablet by mouth every 4 (four) hours as needed for moderate pain.   0 10/28/2016 at Unknown time  . levothyroxine (SYNTHROID, LEVOTHROID) 25 MCG tablet Take 25 mcg by mouth daily.     10/28/2016 at Unknown time  . loperamide (IMODIUM) 2 MG capsule Take 2 mg by mouth every 2 (two) hours as needed for diarrhea or loose stools.   10/29/2016 at Unknown time  . magnesium oxide (MAG-OX) 400 (241.3 Mg) MG tablet Take 1 tablet (400 mg total) by mouth 3 (three) times daily. 90 tablet 0 10/28/2016 at Unknown time  . metoprolol succinate (TOPROL-XL) 25 MG 24 hr tablet Take 25 mg by mouth daily.   10/28/2016 at 1200  . Multiple  Vitamin (MULTIVITAMIN WITH MINERALS) TABS tablet Take 2 tablets by mouth daily.   10/28/2016 at Unknown time  . ondansetron (ZOFRAN) 8 MG tablet Take 1 tablet (8 mg total) by mouth 3 (three) times daily as needed for nausea or vomiting. 30 tablet 0 10/29/2016 at Unknown time  . oxyCODONE-acetaminophen (PERCOCET/ROXICET) 5-325 MG tablet Take 1-2 tablets by mouth every 4 (four) hours as needed for severe pain. 20 tablet 0 10/29/2016 at Unknown time  . pantoprazole (PROTONIX) 40 MG tablet Take 40 mg by mouth 2 (two) times daily.    10/28/2016 at Unknown time  . potassium chloride SA (K-DUR,KLOR-CON) 20 MEQ tablet Take 1 tablet (20 mEq total) by mouth daily. 10 tablet 0 10/28/2016 at Unknown time  . PROAIR HFA 108 (90 BASE)  MCG/ACT inhaler Inhale 1-2 puffs into the lungs every 4 (four) hours as needed.   0 Past Week at Unknown time  . prochlorperazine (COMPAZINE) 10 MG tablet Take 1 tablet (10 mg total) by mouth every 6 (six) hours as needed for nausea or vomiting. 30 tablet 0 Past Month at Unknown time  . triamterene-hydrochlorothiazide (DYAZIDE) 37.5-25 MG per capsule Take 1 capsule by mouth every morning.     Past Week at Unknown time  . levofloxacin (LEVAQUIN) 750 MG tablet Take 1 tablet (750 mg total) by mouth daily. 5 tablet 0 Taking  . magnesium oxide (MAG-OX) 400 (241.3 Mg) MG tablet Take 1 tablet (400 mg total) by mouth 3 (three) times daily. (Patient not taking: Reported on 10/29/2016) 90 tablet 0 Not Taking at Unknown time  . methylPREDNISolone (MEDROL) 4 MG tablet Take 6 tabs by mouth on day 1, 5 tabs on day 2, 4 tabs on day 3, 3 tabs on day 4, 2 tabs on day 5, and 1 tabs on day 6. Then stop. 21 tablet 0    Scheduled: . aspirin EC  81 mg Oral QODAY  . estradiol  2 mg Oral Daily  . FLUoxetine  40 mg Oral Daily  . heparin  5,000 Units Subcutaneous Q8H  . lactose free nutrition  237 mL Oral BID BM  . levothyroxine  25 mcg Oral QAC breakfast  . metoprolol succinate  25 mg Oral Daily  .  multivitamin with minerals  2 tablet Oral Daily  . predniSONE  50 mg Oral Q breakfast   Continuous: . 0.9 % NaCl with KCl 20 mEq / L 75 mL/hr at 11/01/16 1030   XVQ:[id number], lidocaine, ondansetron (ZOFRAN) IV, oxyCODONE-acetaminophen, promethazine, temazepam Anti-infectives    Start     Dose/Rate Route Frequency Ordered Stop   10/30/16 1930  clindamycin (CLEOCIN) IVPB 600 mg  Status:  Discontinued     600 mg 100 mL/hr over 30 Minutes Intravenous Every 8 hours 10/30/16 1829 11/01/16 1131      Results for orders placed or performed during the hospital encounter of 10/29/16 (from the past 48 hour(s))  CBC     Status: Abnormal   Collection Time: 10/31/16  4:03 AM  Result Value Ref Range   WBC 8.6 4.0 - 10.5 K/uL   RBC 3.33 (L) 3.87 - 5.11 MIL/uL   Hemoglobin 9.8 (L) 12.0 - 15.0 g/dL   HCT 29.4 (L) 36.0 - 46.0 %   MCV 88.3 78.0 - 100.0 fL   MCH 29.4 26.0 - 34.0 pg   MCHC 33.3 30.0 - 36.0 g/dL   RDW 14.8 11.5 - 15.5 %   Platelets 524 (H) 150 - 400 K/uL  Basic metabolic panel     Status: Abnormal   Collection Time: 10/31/16  4:03 AM  Result Value Ref Range   Sodium 137 135 - 145 mmol/L   Potassium 3.8 3.5 - 5.1 mmol/L    Comment: DELTA CHECK NOTED   Chloride 108 101 - 111 mmol/L   CO2 23 22 - 32 mmol/L   Glucose, Bld 88 65 - 99 mg/dL   BUN 7 6 - 20 mg/dL   Creatinine, Ser 0.70 0.44 - 1.00 mg/dL   Calcium 7.8 (L) 8.9 - 10.3 mg/dL   GFR calc non Af Amer >60 >60 mL/min   GFR calc Af Amer >60 >60 mL/min    Comment: (NOTE) The eGFR has been calculated using the CKD EPI equation. This calculation has not been validated in all clinical  situations. eGFR's persistently <60 mL/min signify possible Chronic Kidney Disease.    Anion gap 6 5 - 15  Basic metabolic panel     Status: Abnormal   Collection Time: 11/01/16  3:41 AM  Result Value Ref Range   Sodium 135 135 - 145 mmol/L   Potassium 3.9 3.5 - 5.1 mmol/L   Chloride 104 101 - 111 mmol/L   CO2 25 22 - 32 mmol/L   Glucose,  Bld 98 65 - 99 mg/dL   BUN <5 (L) 6 - 20 mg/dL   Creatinine, Ser 0.75 0.44 - 1.00 mg/dL   Calcium 7.9 (L) 8.9 - 10.3 mg/dL   GFR calc non Af Amer >60 >60 mL/min   GFR calc Af Amer >60 >60 mL/min    Comment: (NOTE) The eGFR has been calculated using the CKD EPI equation. This calculation has not been validated in all clinical situations. eGFR's persistently <60 mL/min signify possible Chronic Kidney Disease.    Anion gap 6 5 - 15  CBC     Status: Abnormal   Collection Time: 11/01/16  3:41 AM  Result Value Ref Range   WBC 9.0 4.0 - 10.5 K/uL   RBC 3.13 (L) 3.87 - 5.11 MIL/uL   Hemoglobin 9.0 (L) 12.0 - 15.0 g/dL   HCT 27.4 (L) 36.0 - 46.0 %   MCV 87.5 78.0 - 100.0 fL   MCH 28.8 26.0 - 34.0 pg   MCHC 32.8 30.0 - 36.0 g/dL   RDW 14.9 11.5 - 15.5 %   Platelets 478 (H) 150 - 400 K/uL    No results found.  Review of Systems  Constitutional: Positive for fever and weight loss.  HENT: Negative.   Eyes: Negative.   Respiratory: Positive for cough and wheezing. Negative for hemoptysis, sputum production and shortness of breath.   Cardiovascular: Positive for chest pain (with anxiety) and leg swelling. Negative for palpitations, orthopnea, claudication and PND.  Gastrointestinal: Positive for abdominal pain, diarrhea, heartburn, nausea and vomiting. Negative for blood in stool, constipation and melena.  Genitourinary: Negative.   Musculoskeletal: Negative.   Skin: Positive for rash.  Neurological: Negative.   Endo/Heme/Allergies: Negative for environmental allergies and polydipsia. Bruises/bleeds easily.  Psychiatric/Behavioral: The patient is nervous/anxious.    Blood pressure (!) 131/58, pulse 76, temperature 98 F (36.7 C), temperature source Oral, resp. rate 18, height 5' 4" (1.626 m), weight 81.5 kg (179 lb 10.8 oz), SpO2 96 %.    Rebekah Henderson is an 60 y.o. female.  Physical Exam  Constitutional: She is oriented to person, place, and time.  Chronically ill female, in no  distress.  Overweight, ongoing nausea with vomiting today.  Unable to eat.  HENT:  Mouth/Throat: No oropharyngeal exudate.  Eyes: Right eye exhibits no discharge. Left eye exhibits no discharge. No scleral icterus.  Neck: Normal range of motion. Neck supple. No JVD present. No tracheal deviation present. No thyromegaly present.  Cardiovascular: Normal rate, normal heart sounds and intact distal pulses.   No murmur heard. Respiratory: Effort normal and breath sounds normal. No respiratory distress. She has no wheezes. She has no rales. She exhibits no tenderness.  GI: Soft. Bowel sounds are normal. She exhibits no distension and no mass. There is no tenderness. There is no rebound and no guarding.  Multiple sites of ecchymosis from anticoagulation rx.  Musculoskeletal: She exhibits edema (right more than left in areas of rash).  Lymphadenopathy:    She has no cervical adenopathy.  Neurological: She is alert  and oriented to person, place, and time. No cranial nerve deficit.  Skin: Skin is warm and dry. Rash noted. There is erythema.  See picture by Dr. Rockne Menghini, added to my note also.  Psychiatric: She has a normal mood and affect. Her behavior is normal. Judgment and thought content normal.    Assessment/Plan: Intractable nausea, vomiting and diarrhea Progressive skin rash Stage I small cell lung cancer with chemotherapy Hypertension GERD Fibromyalgia COPD Hypothyroid   Plan:  Skin biopsy from the right leg.   Procedure:   An area below the patella, right leg medial aspect was cleaned with betadine swabs x 3.  2% plain lidocaine was then injected intradermally using sterile technique.  After adequate anesthesia a 3 mm skin punch was used to obtain 2 samples of skin about 1.5 cm apart from each other.  It bled vigorously from the injection sites and both biopsy sites.  It was controlled with direct pressure.  Once the bleeding had stopped, Bacitracin ointment was applied to both sites, dry  sterile gauze dressing held in place with Kerlix to avoid tape on the skin.  Pt tolerated the procedure well.  She was stable and comfortable when I completed the procedure.    Radwan Cowley 11/01/2016, 12:25 PM

## 2016-11-02 DIAGNOSIS — E039 Hypothyroidism, unspecified: Secondary | ICD-10-CM

## 2016-11-02 DIAGNOSIS — J449 Chronic obstructive pulmonary disease, unspecified: Secondary | ICD-10-CM

## 2016-11-02 LAB — CBC
HCT: 28.1 % — ABNORMAL LOW (ref 36.0–46.0)
HEMOGLOBIN: 9.3 g/dL — AB (ref 12.0–15.0)
MCH: 28.8 pg (ref 26.0–34.0)
MCHC: 33.1 g/dL (ref 30.0–36.0)
MCV: 87 fL (ref 78.0–100.0)
PLATELETS: 505 10*3/uL — AB (ref 150–400)
RBC: 3.23 MIL/uL — AB (ref 3.87–5.11)
RDW: 14.9 % (ref 11.5–15.5)
WBC: 8.4 10*3/uL (ref 4.0–10.5)

## 2016-11-02 LAB — BASIC METABOLIC PANEL
ANION GAP: 8 (ref 5–15)
CHLORIDE: 108 mmol/L (ref 101–111)
CO2: 24 mmol/L (ref 22–32)
Calcium: 8.3 mg/dL — ABNORMAL LOW (ref 8.9–10.3)
Creatinine, Ser: 0.56 mg/dL (ref 0.44–1.00)
Glucose, Bld: 121 mg/dL — ABNORMAL HIGH (ref 65–99)
POTASSIUM: 4.2 mmol/L (ref 3.5–5.1)
SODIUM: 140 mmol/L (ref 135–145)

## 2016-11-02 MED ORDER — BACITRACIN ZINC 500 UNIT/GM EX OINT
TOPICAL_OINTMENT | CUTANEOUS | 0 refills | Status: DC | PRN
Start: 2016-11-02 — End: 2016-12-11

## 2016-11-02 MED ORDER — PREDNISONE 5 MG PO TABS: 50.0000 mg | ORAL_TABLET | Freq: Every day | ORAL | 0 refills | Status: DC

## 2016-11-02 NOTE — Discharge Summary (Signed)
Physician Discharge Summary  Rebekah Henderson EQA:[id number] DOB: 06-09-1956 DOA: 10/29/2016  PCP: Glo Herring., MD  Admit date: 10/29/2016 Discharge date: 11/02/2016  Recommendations for Outpatient Follow-up:  1. Taper down prednisone starting from 50 mg a day, taper down by 5 mg a day down to 0 mg. for ex, today 50 mg, tomorrow 45 mg, then 40 mg the following day and etc... 2. Protonix on hold due to diarrhea.  Discharge Diagnoses:  Active Problems:   Diarrhea   COPD (chronic obstructive pulmonary disease) (HCC)   Hypertension   Fibromyalgia   GERD (gastroesophageal reflux disease)   Hypothyroidism   Small cell lung cancer, left (HCC)   Intractable nausea and vomiting   Hypokalemia   Hypomagnesemia   Chronic respiratory failure with hypoxia (HCC)   Vomiting   Abdominal pain   Rash and nonspecific skin eruption   Protein-calorie malnutrition, severe    Discharge Condition: stable; pt insisted on going home today, I have offered her to stay until bx results are back but she did not want to stay any more in hospital; will let her know of results of bx when they are available   Diet recommendation: as tolerated   History of present illness:   Per brief narrative 11/01/2016  "60 y.o. 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 Julien Nordmann. Pt presented for evaluation of progressive watery diarrhea, nausea and vomiting, and lower extremity edema and erythema 1 week in duration."  Hospital Course:   Principal problem:  Intractable nausea and vomiting - Continues to be extremely nauseated with dry heaves.  - Continue Zofran as needed  Active problems:  Diarrhea - Possibly related to side of chemotherapy, although she has been on clindamycin for suspected cellulitis. - Her chemo infusion was 10/04/2016. - Stool studies  negative. Stopped clindamycin 12/1  Limited stage small cell lung cancer - Status post 1 cycle of systemic chemotherapy with cisplatin and etoposide. - Chemotherapy discontinued secondary to intolerance with significant fatigue and weakness as well as N/V, dehydration.  - Evaluated by oncologist while in hospital.   Multifocal erythematous skin rash - Etiology is unclear, ? could be secondary to her systemic chemotherapy.  - Punch bx done 12/1 but result pending and pt did not want to stay and wait for results - We will prescribe prednisone taper as noted above - Follow up outt with PCP per sch appt  Essential hypertension - Continue metoprolol.   COPD (chronic obstructive pulmonary disease) (HCC)/chronic respiratory failure - Stable, not in acute exacerbation. Resume nocturnal oxygen as she takes at home.  Fibromyalgia / Depression - Continue pain management efforts.  - Continue fluoxetine.   Hypothyroidism - Continue levothyroxine.   GERD (gastroesophageal reflux disease) - PPI on hold secondary to diarrhea.   Hypokalemia - She takes potassium supplement at home  Hyponatremia  - Pre renal in etiology, resolved with IVF.  Obesity  - Body mass index is 30.84 kg/m.    DVT prophylaxis: Lovenox ordered. Code Status: Full Code.  Family Communication: No family at the bedside.    Medical Consultants:  Oncology  Surgery    Procedures:  Punch biopsy   Anti-Infectives:  Clindamycin 10/30/16---> 11/01/16   Signed:  Leisa Lenz, MD  [facility] 11/02/2016, 12:50 PM  Pager #: [phone]  Time spent in minutes: less than 30 minutes    Discharge Exam: Vitals:   11/01/16  2023 11/02/16 0500  BP: 134/84 (!) 104/54  Pulse: (!) 112 66  Resp: 20 18  Temp: 97 F (36.1 C) 97.3 F (36.3 C)   Vitals:   11/01/16 0622 11/01/16 1459 11/01/16 2023 11/02/16 0500  BP: (!) 131/58 140/80 134/84 (!) 104/54  Pulse: 76 76 (!) 112 66  Resp:  '18 16 20 18  '$ Temp: 98 F (36.7 C) 97.6 F (36.4 C) 97 F (36.1 C) 97.3 F (36.3 C)  TempSrc: Oral Oral Oral Oral  SpO2: 96% 92% 96% 93%  Weight:      Height:        General: Pt is alert, follows commands appropriately, not in acute distress Cardiovascular: Regular rate and rhythm, S1/S2 +, no murmurs Respiratory: Clear to auscultation bilaterally, no wheezing, no crackles, no rhonchi Abdominal: Soft, non tender, non distended, bowel sounds +, no guarding Extremities: le swelling better and redness better per patient, redness however still present.  Neuro: Grossly nonfocal  Discharge Instructions  Discharge Instructions    Call MD for:  persistant nausea and vomiting    Complete by:  As directed    Call MD for:  redness, tenderness, or signs of infection (pain, swelling, redness, odor or green/yellow discharge around incision site)    Complete by:  As directed    Call MD for:  severe uncontrolled pain    Complete by:  As directed    Diet - low sodium heart healthy    Complete by:  As directed    Discharge instructions    Complete by:  As directed    Taper down prednisone starting from 50 mg a day, taper down by 5 mg a day down to 0 mg. for ex, today 50 mg, tomorrow 45 mg, then 40 mg the following day and etc... Please note that biopsy result is still pending, we will let you know of the result as soon as it becomes available   Increase activity slowly    Complete by:  As directed        Medication List    STOP taking these medications   doxycycline 100 MG tablet Commonly known as:  VIBRA-TABS   levofloxacin 750 MG tablet Commonly known as:  LEVAQUIN   methylPREDNISolone 4 MG tablet Commonly known as:  MEDROL   pantoprazole 40 MG tablet Commonly known as:  PROTONIX     TAKE these medications   albuterol 2 MG tablet Commonly known as:  PROVENTIL Take 2 mg by mouth daily as needed for wheezing or shortness of breath.   PROAIR HFA 108 (90 Base) MCG/ACT  inhaler Generic drug:  albuterol Inhale 1-2 puffs into the lungs every 4 (four) hours as needed.   amLODipine 5 MG tablet Commonly known as:  NORVASC Take 5 mg by mouth daily.   aspirin EC 81 MG tablet Take 81 mg by mouth every other day.   bacitracin ointment Apply topically as needed for wound care (skin biopsy).   estazolam 2 MG tablet Commonly known as:  PROSOM Take 2 mg by mouth at bedtime.   estradiol 2 MG tablet Commonly known as:  ESTRACE Take 2 mg by mouth daily.   FLUoxetine 40 MG capsule Commonly known as:  PROZAC Take 40 mg by mouth daily.   HYDROcodone-acetaminophen 5-325 MG tablet Commonly known as:  NORCO/VICODIN Take 1 tablet by mouth every 4 (four) hours as needed for moderate pain.   levothyroxine 25 MCG tablet Commonly known as:  SYNTHROID, LEVOTHROID Take 25 mcg by  mouth daily.   loperamide 2 MG capsule Commonly known as:  IMODIUM Take 2 mg by mouth every 2 (two) hours as needed for diarrhea or loose stools.   magnesium oxide 400 (241.3 Mg) MG tablet Commonly known as:  MAG-OX Take 1 tablet (400 mg total) by mouth 3 (three) times daily. What changed:  Another medication with the same name was removed. Continue taking this medication, and follow the directions you see here.   metoprolol succinate 25 MG 24 hr tablet Commonly known as:  TOPROL-XL Take 25 mg by mouth daily.   multivitamin with minerals Tabs tablet Take 2 tablets by mouth daily.   ondansetron 8 MG tablet Commonly known as:  ZOFRAN Take 1 tablet (8 mg total) by mouth 3 (three) times daily as needed for nausea or vomiting.   oxyCODONE-acetaminophen 5-325 MG tablet Commonly known as:  PERCOCET/ROXICET Take 1-2 tablets by mouth every 4 (four) hours as needed for severe pain.   potassium chloride SA 20 MEQ tablet Commonly known as:  K-DUR,KLOR-CON Take 1 tablet (20 mEq total) by mouth daily.   predniSONE 5 MG tablet Commonly known as:  DELTASONE Take 10 tablets (50 mg total)  by mouth daily with breakfast. Taper down prednisone starting from 50 mg a day, taper down by 5 mg a day down to 0 mg. for ex, today 50 mg, tomorrow 45 mg, then 40 mg the following day and etc...   prochlorperazine 10 MG tablet Commonly known as:  COMPAZINE Take 1 tablet (10 mg total) by mouth every 6 (six) hours as needed for nausea or vomiting.   triamterene-hydrochlorothiazide 37.5-25 MG capsule Commonly known as:  DYAZIDE Take 1 capsule by mouth every morning.       Follow-up Information    Glo Herring., MD. Schedule an appointment as soon as possible for a visit in 1 week(s).   Specialty:  Internal Medicine Contact information: [address] [place] [address] [phone]            The results of significant diagnostics from this hospitalization (including imaging, microbiology, ancillary and laboratory) are listed below for reference.    Significant Diagnostic Studies: Dg Chest 2 View  Result Date: 10/19/2016 CLINICAL DATA:  Shortness of breath, chest pain EXAM: CHEST  2 VIEW COMPARISON:  10/15/2016 FINDINGS: Mild peribronchial thickening. Heart and mediastinal contours are within normal limits. No focal opacities or effusions. No acute bony abnormality. IMPRESSION: Mild bronchitic changes. Electronically Signed   By: Rolm Baptise M.D.   On: 10/19/2016 13:21   Dg Chest 2 View  Result Date: 10/15/2016 CLINICAL DATA:  Onset of fever with nausea vomiting and diarrhea last night. Patient is undergoing treatment for lung malignancy. Patient is a former smoker with history of COPD EXAM: CHEST  2 VIEW COMPARISON:  PA and lateral chest x-ray of August 27, 2016 FINDINGS: The lungs are mildly hyperinflated. There is no focal infiltrate. There is persistent increased density at the right lung base which follows the contour of the hemidiaphragm. This is new since October 2016 but not significantly changed since August 27, 2016. Soft tissue fullness in the AP  window region persists. There is faint calcification in the wall of the aortic arch. The heart and pulmonary vascularity are normal. The observed bony thorax exhibits no acute abnormality. The patient has undergone previous lower anterior cervical fusion. IMPRESSION: COPD. Persistent increased density at the right lung base. Persistent AP window lymphadenopathy. Chest CT scanning is recommended for further evaluation of the mediastinum  and right lower lung. Thoracic aortic atherosclerosis. Electronically Signed   By: David  Martinique M.D.   On: 10/15/2016 10:37   Dg Abd Portable 1v  Result Date: 10/16/2016 CLINICAL DATA:  Nausea and diarrhea EXAM: PORTABLE ABDOMEN - 1 VIEW COMPARISON:  PET-CT 09/27/2016 FINDINGS: No dilated loops of large or small bowel. Gas and stool in the rectum. No pathologic calcifications. No organomegaly. No acute osseous abnormality. Postcholecystectomy. IMPRESSION: No acute abdominal findings. Electronically Signed   By: Suzy Bouchard M.D.   On: 10/16/2016 07:44    Microbiology: Recent Results (from the past 240 hour(s))  Gastrointestinal Panel by PCR , Stool     Status: None   Collection Time: 10/30/16  8:21 AM  Result Value Ref Range Status   Campylobacter species NOT DETECTED NOT DETECTED Final   Plesimonas shigelloides NOT DETECTED NOT DETECTED Final   Salmonella species NOT DETECTED NOT DETECTED Final   Yersinia enterocolitica NOT DETECTED NOT DETECTED Final   Vibrio species NOT DETECTED NOT DETECTED Final   Vibrio cholerae NOT DETECTED NOT DETECTED Final   Enteroaggregative E coli (EAEC) NOT DETECTED NOT DETECTED Final   Enteropathogenic E coli (EPEC) NOT DETECTED NOT DETECTED Final   Enterotoxigenic E coli (ETEC) NOT DETECTED NOT DETECTED Final   Shiga like toxin producing E coli (STEC) NOT DETECTED NOT DETECTED Final   Shigella/Enteroinvasive E coli (EIEC) NOT DETECTED NOT DETECTED Final   Cryptosporidium NOT DETECTED NOT DETECTED Final   Cyclospora  cayetanensis NOT DETECTED NOT DETECTED Final   Entamoeba histolytica NOT DETECTED NOT DETECTED Final   Giardia lamblia NOT DETECTED NOT DETECTED Final   Adenovirus F40/41 NOT DETECTED NOT DETECTED Final   Astrovirus NOT DETECTED NOT DETECTED Final   Norovirus GI/GII NOT DETECTED NOT DETECTED Final   Rotavirus A NOT DETECTED NOT DETECTED Final   Sapovirus (I, II, IV, and V) NOT DETECTED NOT DETECTED Final  OVA + PARASITE EXAM     Status: None   Collection Time: 10/30/16  8:21 AM  Result Value Ref Range Status   OVA + PARASITE EXAM Final report  Final    Comment: (NOTE) These results were obtained using wet preparation(s) and trichrome stained smear. This test does not include testing for Cryptosporidium parvum, Cyclospora, or Microsporidia. Performed At: [facility] [place], [place] [id number] Elwanda Brooklyn R MD [id number]    Source of Sample STOOL  Final  Stool culture (children & immunocomp patients)     Status: None (Preliminary result)   Collection Time: 10/30/16  8:21 AM  Result Value Ref Range Status   Salmonella/Shigella Screen Final report  Final    Comment: (NOTE) Performed At: [facility] [place], [place] [id number] Lindon Romp MD UY:[id number]    Campylobacter Culture PENDING  Incomplete   E coli, Shiga toxin Assay Negative Negative Final    Comment: (NOTE) Performed At: [facility] [place], [place] [id number] Lindon Romp MD IR:[id number]   STOOL CULTURE REFLEX - RSASHR     Status: None   Collection Time: 10/30/16  8:21 AM  Result Value Ref Range Status   Stool Culture result 1 (RSASHR) Comment  Final    Comment: (NOTE) No Salmonella or Shigella recovered. Performed At: [facility] [place], [place] [id number] Lindon Romp MD AT:[id number]      Labs: Basic Metabolic Panel:  Recent Labs Lab 10/29/16 0949 10/30/16 0409 10/31/16 0403  11/01/16 0341 11/02/16 0352  NA  134* 135 137 135 140  K 3.4* 4.6 3.8 3.9 4.2  CL 96* 104 108 104 108  CO2 '25 25 23 25 24  '$ GLUCOSE 126* 94 88 98 121*  BUN '19 8 7 '$ <5* <5*  CREATININE 1.02* 0.76 0.70 0.75 0.56  CALCIUM 8.7* 7.7* 7.8* 7.9* 8.3*  MG 2.2  --   --   --   --   PHOS 2.9  --   --   --   --    Liver Function Tests:  Recent Labs Lab 10/29/16 0949 10/30/16 0409  AST 12* 16  ALT 9* 9*  ALKPHOS 55 41  BILITOT 0.9 1.0  PROT 7.7 5.9*  ALBUMIN 3.2* 2.4*    Recent Labs Lab 10/29/16 0949  LIPASE 15   No results for input(s): AMMONIA in the last 168 hours. CBC:  Recent Labs Lab 10/29/16 0949 10/30/16 0409 10/31/16 0403 11/01/16 0341 11/02/16 0352  WBC 13.9* 10.4 8.6 9.0 8.4  NEUTROABS 11.6*  --   --   --   --   HGB 12.7 10.2* 9.8* 9.0* 9.3*  HCT 38.3 31.0* 29.4* 27.4* 28.1*  MCV 86.7 88.3 88.3 87.5 87.0  PLT 599* 535* 524* 478* 505*   Cardiac Enzymes: No results for input(s): CKTOTAL, CKMB, CKMBINDEX, TROPONINI in the last 168 hours. BNP: BNP (last 3 results)  Recent Labs  11/09/15 1212 11/11/15 1045  BNP CANCELED 13.1    ProBNP (last 3 results) No results for input(s): PROBNP in the last 8760 hours.  CBG: No results for input(s): GLUCAP in the last 168 hours.

## 2016-11-02 NOTE — Care Management Important Message (Signed)
Important Message  Patient Details  Name: Rebekah Henderson MRN: [id number] Date of Birth: Sep 10, 1956   Medicare Important Message Given:  Yes    Erenest Rasher, RN 11/02/2016, 1:11 PM

## 2016-11-02 NOTE — Evaluation (Addendum)
Physical Therapy Evaluation Patient Details Name: Rebekah Henderson MRN: [id number] DOB: 05/11/1956 Today's Date: 11/02/2016   History of Present Illness  Pt admitted with N/V/D and with recent dx of lung CA and initiating chemo  Clinical Impression  Pt admitted as above but reports much improved N/V/D and feeling much better.  Pt demonstrates ability to mobilize unassisted with good safety awareness and no loss of balance.  Pt plans dc to home with intermittent assist of friends/family.  Pt agrees no current PT needs and PT service will sign off at this time.    Follow Up Recommendations No PT follow up    Equipment Recommendations  None recommended by PT    Recommendations for Other Services       Precautions / Restrictions Precautions Precautions: Fall Restrictions Weight Bearing Restrictions: No      Mobility  Bed Mobility Overal bed mobility: Modified Independent                Transfers Overall transfer level: Modified independent                  Ambulation/Gait Ambulation/Gait assistance: Supervision;Independent Ambulation Distance (Feet): 450 Feet Assistive device: None Gait Pattern/deviations: WFL(Within Functional Limits)     General Gait Details: No balance loss including stepping fwd, sideways and bkwd.  Mild SOB noted with 2 short standing breaks required to complete task  Stairs            Wheelchair Mobility    Modified Rankin (Stroke Patients Only)       Balance Overall balance assessment: No apparent balance deficits (not formally assessed)                                           Pertinent Vitals/Pain Pain Assessment: No/denies pain    Home Living Family/patient expects to be discharged to:: Private residence Living Arrangements: Alone Available Help at Discharge: Friend(s) Type of Home: House Home Access: Level entry     Home Layout: One level Home Equipment: Environmental consultant - 2 wheels;Cane - single  point      Prior Function Level of Independence: Independent               Hand Dominance        Extremity/Trunk Assessment   Upper Extremity Assessment: Overall WFL for tasks assessed           Lower Extremity Assessment: Overall WFL for tasks assessed      Cervical / Trunk Assessment: Normal  Communication   Communication: No difficulties  Cognition Arousal/Alertness: Awake/alert Behavior During Therapy: WFL for tasks assessed/performed Overall Cognitive Status: Within Functional Limits for tasks assessed                      General Comments      Exercises     Assessment/Plan    PT Assessment Patent does not need any further PT services  PT Problem List            PT Treatment Interventions      PT Goals (Current goals can be found in the Care Plan section)  Acute Rehab PT Goals Patient Stated Goal: HOME    Frequency     Barriers to discharge        Co-evaluation               End  of Session   Activity Tolerance: Patient tolerated treatment well Patient left: in chair;with call bell/phone within reach Nurse Communication: Mobility status         Time: 0034-9179 PT Time Calculation (min) (ACUTE ONLY): 19 min   Charges:   PT Evaluation $PT Eval Low Complexity: 1 Procedure     PT G Codes:        Shannel Zahm 11-06-16, 12:10 PM

## 2016-11-02 NOTE — Progress Notes (Signed)
General Surgery [facility]  Assessment & Plan:  Skin biopsy (punch X 2) right lower extremity POD#1  Wounds are clear and dry and intact  No bleeding overnight  Will sign off - call if needed        Earnstine Regal, MD, [facility]       Office: [phone]    Subjective: Patient in bed, comfortable.  No compaints.  Objective: Vital signs in last 24 hours: Temp:  [97 F (36.1 C)-97.6 F (36.4 C)] 97.3 F (36.3 C) (12/02 0500) Pulse Rate:  [66-112] 66 (12/02 0500) Resp:  [16-20] 18 (12/02 0500) BP: (104-140)/(54-84) 104/54 (12/02 0500) SpO2:  [92 %-96 %] 93 % (12/02 0500) Last BM Date: 11/01/16  Intake/Output from previous day: 12/01 0701 - 12/02 0700 In: 722.5 [I.V.:722.5] Out: -  Intake/Output this shift: No intake/output data recorded.  Physical Exam: HEENT - sclerae clear, mucous membranes moist Ext - two puncture wounds anterior right lower extremity dry and intact Neuro - alert & oriented, no focal deficits  Lab Results:   Recent Labs  11/01/16 0341 11/02/16 0352  WBC 9.0 8.4  HGB 9.0* 9.3*  HCT 27.4* 28.1*  PLT 478* 505*   BMET  Recent Labs  11/01/16 0341 11/02/16 0352  NA 135 140  K 3.9 4.2  CL 104 108  CO2 25 24  GLUCOSE 98 121*  BUN <5* <5*  CREATININE 0.75 0.56  CALCIUM 7.9* 8.3*   PT/INR No results for input(s): LABPROT, INR in the last 72 hours. Comprehensive Metabolic Panel:    Component Value Date/Time   NA 140 11/02/2016 0352   NA 135 11/01/2016 0341   NA 136 10/21/2016 0942   NA 140 10/09/2016 1415   K 4.2 11/02/2016 0352   K 3.9 11/01/2016 0341   K 3.1 (L) 10/21/2016 0942   K 3.8 10/09/2016 1415   CL 108 11/02/2016 0352   CL 104 11/01/2016 0341   CO2 24 11/02/2016 0352   CO2 25 11/01/2016 0341   CO2 22 10/21/2016 0942   CO2 27 10/09/2016 1415   BUN <5 (L) 11/02/2016 0352   BUN <5 (L) 11/01/2016 0341   BUN 5.6 (L) 10/21/2016 0942   BUN 11.7 10/09/2016 1415    CREATININE 0.56 11/02/2016 0352   CREATININE 0.75 11/01/2016 0341   CREATININE 0.7 10/21/2016 0942   CREATININE 0.7 10/09/2016 1415   GLUCOSE 121 (H) 11/02/2016 0352   GLUCOSE 98 11/01/2016 0341   GLUCOSE 125 10/21/2016 0942   GLUCOSE 90 10/09/2016 1415   CALCIUM 8.3 (L) 11/02/2016 0352   CALCIUM 7.9 (L) 11/01/2016 0341   CALCIUM 9.4 10/21/2016 0942   CALCIUM 9.5 10/09/2016 1415   AST 16 10/30/2016 0409   AST 12 (L) 10/29/2016 0949   AST 9 10/21/2016 0942   AST 19 10/09/2016 1415   ALT 9 (L) 10/30/2016 0409   ALT 9 (L) 10/29/2016 0949   ALT 6 10/21/2016 0942   ALT 20 10/09/2016 1415   ALKPHOS 41 10/30/2016 0409   ALKPHOS 55 10/29/2016 0949   ALKPHOS 66 10/21/2016 0942   ALKPHOS 51 10/09/2016 1415   BILITOT 1.0 10/30/2016 0409   BILITOT 0.9 10/29/2016 0949   BILITOT 0.48 10/21/2016 0942   BILITOT 0.74 10/09/2016 1415   PROT 5.9 (L) 10/30/2016 0409   PROT 7.7 10/29/2016 0949   PROT 7.2 10/21/2016 0942   PROT 7.1 10/09/2016 1415   ALBUMIN 2.4 (L)  10/30/2016 0409   ALBUMIN 3.2 (L) 10/29/2016 0949   ALBUMIN 2.8 (L) 10/21/2016 0942   ALBUMIN 3.5 10/09/2016 1415    Studies/Results: No results found.    Konnie Noffsinger M 11/02/2016  Patient ID: Rebekah Henderson, female   DOB: 21-Aug-1956, 60 y.o.   MRN: [id number]

## 2016-11-02 NOTE — Discharge Instructions (Signed)
Prednisone tablets °What is this medicine? °PREDNISONE (PRED ni sone) is a corticosteroid. It is commonly used to treat inflammation of the skin, joints, lungs, and other organs. Common conditions treated include asthma, allergies, and arthritis. It is also used for other conditions, such as blood disorders and diseases of the adrenal glands. °This medicine may be used for other purposes; ask your health care provider or pharmacist if you have questions. °COMMON BRAND NAME(S): Deltasone, Predone, Sterapred, Sterapred DS °What should I tell my health care provider before I take this medicine? °They need to know if you have any of these conditions: °-Cushing's syndrome °-diabetes °-glaucoma °-heart disease °-high blood pressure °-infection (especially a virus infection such as chickenpox, cold sores, or herpes) °-kidney disease °-liver disease °-mental illness °-myasthenia gravis °-osteoporosis °-seizures °-stomach or intestine problems °-thyroid disease °-an unusual or allergic reaction to lactose, prednisone, other medicines, foods, dyes, or preservatives °-pregnant or trying to get pregnant °-breast-feeding °How should I use this medicine? °Take this medicine by mouth with a glass of water. Follow the directions on the prescription label. Take this medicine with food. If you are taking this medicine once a day, take it in the morning. Do not take more medicine than you are told to take. Do not suddenly stop taking your medicine because you may develop a severe reaction. Your doctor will tell you how much medicine to take. If your doctor wants you to stop the medicine, the dose may be slowly lowered over time to avoid any side effects. °Talk to your pediatrician regarding the use of this medicine in children. Special care may be needed. °Overdosage: If you think you have taken too much of this medicine contact a poison control center or emergency room at once. °NOTE: This medicine is only for you. Do not share this  medicine with others. °What if I miss a dose? °If you miss a dose, take it as soon as you can. If it is almost time for your next dose, talk to your doctor or health care professional. You may need to miss a dose or take an extra dose. Do not take double or extra doses without advice. °What may interact with this medicine? °Do not take this medicine with any of the following medications: °-metyrapone °-mifepristone °This medicine may also interact with the following medications: °-aminoglutethimide °-amphotericin B °-aspirin and aspirin-like medicines °-barbiturates °-certain medicines for diabetes, like glipizide or glyburide °-cholestyramine °-cholinesterase inhibitors °-cyclosporine °-digoxin °-diuretics °-ephedrine °-female hormones, like estrogens and birth control pills °-isoniazid °-ketoconazole °-NSAIDS, medicines for pain and inflammation, like ibuprofen or naproxen °-phenytoin °-rifampin °-toxoids °-vaccines °-warfarin °This list may not describe all possible interactions. Give your health care provider a list of all the medicines, herbs, non-prescription drugs, or dietary supplements you use. Also tell them if you smoke, drink alcohol, or use illegal drugs. Some items may interact with your medicine. °What should I watch for while using this medicine? °Visit your doctor or health care professional for regular checks on your progress. If you are taking this medicine over a prolonged period, carry an identification card with your name and address, the type and dose of your medicine, and your doctor's name and address. °This medicine may increase your risk of getting an infection. Tell your doctor or health care professional if you are around anyone with measles or chickenpox, or if you develop sores or blisters that do not heal properly. °If you are going to have surgery, tell your doctor or health care professional that   you have taken this medicine within the last twelve months. Ask your doctor or health  care professional about your diet. You may need to lower the amount of salt you eat. This medicine may affect blood sugar levels. If you have diabetes, check with your doctor or health care professional before you change your diet or the dose of your diabetic medicine. What side effects may I notice from receiving this medicine? Side effects that you should report to your doctor or health care professional as soon as possible: -allergic reactions like skin rash, itching or hives, swelling of the face, lips, or tongue -changes in emotions or moods -changes in vision -depressed mood -eye pain -fever or chills, cough, sore throat, pain or difficulty passing urine -increased thirst -swelling of ankles, feet Side effects that usually do not require medical attention (report to your doctor or health care professional if they continue or are bothersome): -confusion, excitement, restlessness -headache -nausea, vomiting -skin problems, acne, thin and shiny skin -trouble sleeping -weight gain This list may not describe all possible side effects. Call your doctor for medical advice about side effects. You may report side effects to FDA at 1-800-FDA-1088. Where should I keep my medicine? Keep out of the reach of children. Store at room temperature between 15 and 30 degrees C (59 and 86 degrees F). Protect from light. Keep container tightly closed. Throw away any unused medicine after the expiration date. NOTE: This sheet is a summary. It may not cover all possible information. If you have questions about this medicine, talk to your doctor, pharmacist, or health care provider.  2017 [employer] (2011-07-04 10:57:14)

## 2016-11-04 ENCOUNTER — Other Ambulatory Visit: Payer: Medicare Other

## 2016-11-04 LAB — STOOL CULTURE REFLEX - RSASHR

## 2016-11-04 LAB — STOOL CULTURE: E coli, Shiga toxin Assay: NEGATIVE

## 2016-11-04 LAB — STOOL CULTURE REFLEX - CMPCXR

## 2016-11-06 DIAGNOSIS — T50905S Adverse effect of unspecified drugs, medicaments and biological substances, sequela: Secondary | ICD-10-CM | POA: Diagnosis not present

## 2016-11-06 DIAGNOSIS — K219 Gastro-esophageal reflux disease without esophagitis: Secondary | ICD-10-CM | POA: Diagnosis not present

## 2016-11-06 DIAGNOSIS — Z6832 Body mass index (BMI) 32.0-32.9, adult: Secondary | ICD-10-CM | POA: Diagnosis not present

## 2016-11-06 DIAGNOSIS — F419 Anxiety disorder, unspecified: Secondary | ICD-10-CM | POA: Diagnosis not present

## 2016-11-07 ENCOUNTER — Telehealth: Payer: Self-pay | Admitting: *Deleted

## 2016-11-07 NOTE — Telephone Encounter (Signed)
Spoke with pt and informed pt re:  Per Dr. Julien Nordmann, md will discuss biopsy results at next office visit.   Dr. Julien Nordmann will not prescribe Buspirone as this med can cause seizure.   Instructed pt to discuss anxiety issue at her next office visit.  Pt voiced understanding.
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
## 2016-11-08 NOTE — Progress Notes (Signed)
12//07/2016 counseling intern called to follow-up with patient. Patient stated she continues to struggle with anxiety and has experienced panic attacks, particularly during a recent hospital admission. She reported that she had requested a prescription from her physician. Patient indicated she will visit [facility] for treatment on Monday, December 11, and counselor offered to meet with her then to offer support and strategies for managing anxiety.   Lamount Cohen, Counseling Intern Department for Spiritual Care and [facility] Supervisor - Scientist, research (physical sciences)

## 2016-11-11 ENCOUNTER — Other Ambulatory Visit (HOSPITAL_BASED_OUTPATIENT_CLINIC_OR_DEPARTMENT_OTHER): Payer: Medicare Other

## 2016-11-11 ENCOUNTER — Ambulatory Visit (HOSPITAL_BASED_OUTPATIENT_CLINIC_OR_DEPARTMENT_OTHER): Payer: Medicare Other | Admitting: Internal Medicine

## 2016-11-11 ENCOUNTER — Ambulatory Visit (HOSPITAL_BASED_OUTPATIENT_CLINIC_OR_DEPARTMENT_OTHER): Payer: Medicare Other

## 2016-11-11 ENCOUNTER — Telehealth: Payer: Self-pay | Admitting: Internal Medicine

## 2016-11-11 ENCOUNTER — Encounter: Payer: Self-pay | Admitting: Internal Medicine

## 2016-11-11 VITALS — BP 141/67 | HR 64 | Temp 97.8°F | Resp 18 | Ht 64.0 in | Wt 176.7 lb

## 2016-11-11 DIAGNOSIS — J449 Chronic obstructive pulmonary disease, unspecified: Secondary | ICD-10-CM

## 2016-11-11 DIAGNOSIS — C3432 Malignant neoplasm of lower lobe, left bronchus or lung: Secondary | ICD-10-CM

## 2016-11-11 DIAGNOSIS — R21 Rash and other nonspecific skin eruption: Secondary | ICD-10-CM

## 2016-11-11 DIAGNOSIS — I10 Essential (primary) hypertension: Secondary | ICD-10-CM

## 2016-11-11 DIAGNOSIS — E860 Dehydration: Secondary | ICD-10-CM

## 2016-11-11 DIAGNOSIS — C3492 Malignant neoplasm of unspecified part of left bronchus or lung: Secondary | ICD-10-CM

## 2016-11-11 DIAGNOSIS — J9611 Chronic respiratory failure with hypoxia: Secondary | ICD-10-CM | POA: Diagnosis not present

## 2016-11-11 DIAGNOSIS — Z5111 Encounter for antineoplastic chemotherapy: Secondary | ICD-10-CM | POA: Diagnosis not present

## 2016-11-11 LAB — COMPREHENSIVE METABOLIC PANEL
ALT: 15 U/L (ref 0–55)
ANION GAP: 13 meq/L — AB (ref 3–11)
AST: 12 U/L (ref 5–34)
Albumin: 3.1 g/dL — ABNORMAL LOW (ref 3.5–5.0)
Alkaline Phosphatase: 54 U/L (ref 40–150)
BILIRUBIN TOTAL: 0.4 mg/dL (ref 0.20–1.20)
BUN: 13.4 mg/dL (ref 7.0–26.0)
CHLORIDE: 104 meq/L (ref 98–109)
CO2: 24 meq/L (ref 22–29)
CREATININE: 0.8 mg/dL (ref 0.6–1.1)
Calcium: 9.3 mg/dL (ref 8.4–10.4)
EGFR: 82 mL/min/{1.73_m2} — ABNORMAL LOW (ref >90)
GLUCOSE: 89 mg/dL (ref 70–140)
Potassium: 4 mEq/L (ref 3.5–5.1)
SODIUM: 141 meq/L (ref 136–145)
TOTAL PROTEIN: 6.7 g/dL (ref 6.4–8.3)

## 2016-11-11 LAB — CBC WITH DIFFERENTIAL/PLATELET
BASO%: 0.4 % (ref 0.0–2.0)
Basophils Absolute: 0.1 10*3/uL (ref 0.0–0.1)
EOS%: 1.3 % (ref 0.0–7.0)
Eosinophils Absolute: 0.2 10*3/uL (ref 0.0–0.5)
HCT: 38.9 % (ref 34.8–46.6)
HGB: 12.5 g/dL (ref 11.6–15.9)
LYMPH%: 8.4 % — AB (ref 14.0–49.7)
MCH: 28.4 pg (ref 25.1–34.0)
MCHC: 32.2 g/dL (ref 31.5–36.0)
MCV: 88.4 fL (ref 79.5–101.0)
MONO#: 0.5 10*3/uL (ref 0.1–0.9)
MONO%: 3.7 % (ref 0.0–14.0)
NEUT%: 86.2 % — AB (ref 38.4–76.8)
NEUTROS ABS: 11.8 10*3/uL — AB (ref 1.5–6.5)
Platelets: 409 10*3/uL — ABNORMAL HIGH (ref 145–400)
RBC: 4.4 10*6/uL (ref 3.70–5.45)
RDW: 15.9 % — ABNORMAL HIGH (ref 11.2–14.5)
WBC: 13.7 10*3/uL — AB (ref 3.9–10.3)
lymph#: 1.2 10*3/uL (ref 0.9–3.3)

## 2016-11-11 LAB — MAGNESIUM: Magnesium: 2.1 mg/dl (ref 1.5–2.5)

## 2016-11-11 MED ORDER — SODIUM CHLORIDE 0.9 % IV SOLN
Freq: Once | INTRAVENOUS | Status: AC
  Administered 2016-11-11: 10:00:00 via INTRAVENOUS

## 2016-11-11 MED ORDER — DEXAMETHASONE SODIUM PHOSPHATE 10 MG/ML IJ SOLN
10.0000 mg | Freq: Once | INTRAMUSCULAR | Status: AC
  Administered 2016-11-11: 10 mg via INTRAVENOUS

## 2016-11-11 MED ORDER — PALONOSETRON HCL INJECTION 0.25 MG/5ML
INTRAVENOUS | Status: AC
  Filled 2016-11-11: qty 5

## 2016-11-11 MED ORDER — ETOPOSIDE CHEMO INJECTION 1 GM/50ML
100.0000 mg/m2 | Freq: Once | INTRAVENOUS | Status: AC
  Administered 2016-11-11: 190 mg via INTRAVENOUS
  Filled 2016-11-11: qty 9.5

## 2016-11-11 MED ORDER — SODIUM CHLORIDE 0.9 % IV SOLN
481.6000 mg | Freq: Once | INTRAVENOUS | Status: AC
  Administered 2016-11-11: 480 mg via INTRAVENOUS
  Filled 2016-11-11: qty 48

## 2016-11-11 MED ORDER — DEXAMETHASONE SODIUM PHOSPHATE 10 MG/ML IJ SOLN
INTRAMUSCULAR | Status: AC
  Filled 2016-11-11: qty 1

## 2016-11-11 MED ORDER — PALONOSETRON HCL INJECTION 0.25 MG/5ML
0.2500 mg | Freq: Once | INTRAVENOUS | Status: AC
  Administered 2016-11-11: 0.25 mg via INTRAVENOUS

## 2016-11-11 NOTE — Patient Instructions (Signed)
[facility] Discharge Instructions for Patients Receiving Chemotherapy  Today you received the following chemotherapy agents Carboplatin and Etoposide  To help prevent nausea and vomiting after your treatment, we encourage you to take your nausea medication as directed. No Zofran for 3 days. Take Compazine instead.   If you develop nausea and vomiting that is not controlled by your nausea medication, call the clinic.   BELOW ARE SYMPTOMS THAT SHOULD BE REPORTED IMMEDIATELY:  *FEVER GREATER THAN 100.5 F  *CHILLS WITH OR WITHOUT FEVER  NAUSEA AND VOMITING THAT IS NOT CONTROLLED WITH YOUR NAUSEA MEDICATION  *UNUSUAL SHORTNESS OF BREATH  *UNUSUAL BRUISING OR BLEEDING  TENDERNESS IN MOUTH AND THROAT WITH OR WITHOUT PRESENCE OF ULCERS  *URINARY PROBLEMS  *BOWEL PROBLEMS  UNUSUAL RASH Items with * indicate a potential emergency and should be followed up as soon as possible.  Feel free to call the clinic you have any questions or concerns. The clinic phone number is [phone].  Please show the [facility] at check-in to the Emergency Department and triage nurse.  Carboplatin injection What is this medicine? CARBOPLATIN (KAR boe pla tin) is a chemotherapy drug. It targets fast dividing cells, like cancer cells, and causes these cells to die. This medicine is used to treat ovarian cancer and many other cancers. This medicine may be used for other purposes; ask your health care provider or pharmacist if you have questions. COMMON BRAND NAME(S): Paraplatin What should I tell my health care provider before I take this medicine? They need to know if you have any of these conditions: -blood disorders -hearing problems -kidney disease -recent or ongoing radiation therapy -an unusual or allergic reaction to carboplatin, cisplatin, other chemotherapy, other medicines, foods, dyes, or preservatives -pregnant or trying to get pregnant -breast-feeding How should I  use this medicine? This drug is usually given as an infusion into a vein. It is administered in a hospital or clinic by a specially trained health care professional. Talk to your pediatrician regarding the use of this medicine in children. Special care may be needed. Overdosage: If you think you have taken too much of this medicine contact a poison control center or emergency room at once. NOTE: This medicine is only for you. Do not share this medicine with others. What if I miss a dose? It is important not to miss a dose. Call your doctor or health care professional if you are unable to keep an appointment. What may interact with this medicine? -medicines for seizures -medicines to increase blood counts like filgrastim, pegfilgrastim, sargramostim -some antibiotics like amikacin, gentamicin, neomycin, streptomycin, tobramycin -vaccines Talk to your doctor or health care professional before taking any of these medicines: -acetaminophen -aspirin -ibuprofen -ketoprofen -naproxen This list may not describe all possible interactions. Give your health care provider a list of all the medicines, herbs, non-prescription drugs, or dietary supplements you use. Also tell them if you smoke, drink alcohol, or use illegal drugs. Some items may interact with your medicine. What should I watch for while using this medicine? Your condition will be monitored carefully while you are receiving this medicine. You will need important blood work done while you are taking this medicine. This drug may make you feel generally unwell. This is not uncommon, as chemotherapy can affect healthy cells as well as cancer cells. Report any side effects. Continue your course of treatment even though you feel ill unless your doctor tells you to stop. In some cases, you may be given  additional medicines to help with side effects. Follow all directions for their use. Call your doctor or health care professional for advice if you  get a fever, chills or sore throat, or other symptoms of a cold or flu. Do not treat yourself. This drug decreases your body's ability to fight infections. Try to avoid being around people who are sick. This medicine may increase your risk to bruise or bleed. Call your doctor or health care professional if you notice any unusual bleeding. Be careful brushing and flossing your teeth or using a toothpick because you may get an infection or bleed more easily. If you have any dental work done, tell your dentist you are receiving this medicine. Avoid taking products that contain aspirin, acetaminophen, ibuprofen, naproxen, or ketoprofen unless instructed by your doctor. These medicines may hide a fever. Do not become pregnant while taking this medicine. Women should inform their doctor if they wish to become pregnant or think they might be pregnant. There is a potential for serious side effects to an unborn child. Talk to your health care professional or pharmacist for more information. Do not breast-feed an infant while taking this medicine. What side effects may I notice from receiving this medicine? Side effects that you should report to your doctor or health care professional as soon as possible: -allergic reactions like skin rash, itching or hives, swelling of the face, lips, or tongue -signs of infection - fever or chills, cough, sore throat, pain or difficulty passing urine -signs of decreased platelets or bleeding - bruising, pinpoint red spots on the skin, black, tarry stools, nosebleeds -signs of decreased red blood cells - unusually weak or tired, fainting spells, lightheadedness -breathing problems -changes in hearing -changes in vision -chest pain -high blood pressure -low blood counts - This drug may decrease the number of white blood cells, red blood cells and platelets. You may be at increased risk for infections and bleeding. -nausea and vomiting -pain, swelling, redness or irritation  at the injection site -pain, tingling, numbness in the hands or feet -problems with balance, talking, walking -trouble passing urine or change in the amount of urine Side effects that usually do not require medical attention (report to your doctor or health care professional if they continue or are bothersome): -hair loss -loss of appetite -metallic taste in the mouth or changes in taste This list may not describe all possible side effects. Call your doctor for medical advice about side effects. You may report side effects to FDA at 1-800-FDA-1088. Where should I keep my medicine? This drug is given in a hospital or clinic and will not be stored at home. NOTE: This sheet is a summary. It may not cover all possible information. If you have questions about this medicine, talk to your doctor, pharmacist, or health care provider.  2017 [employer]/Gold Standard (2008-02-23 14:38:05)

## 2016-11-11 NOTE — Telephone Encounter (Signed)
Gave patient avs report and appointments for December and January  °

## 2016-11-11 NOTE — Progress Notes (Signed)
[facility] [phone]   [phone]  OFFICE PROGRESS NOTE  Glo Herring., MD [place] [address]  DIAGNOSIS: Limited stage (T1b, N2, M0) small cell lung cancer presented with left lower lobe/infrahilar mass with large mediastinal lymphadenopathy diagnosed in October 2017.  PRIOR THERAPY: Systemic chemotherapy with cisplatin 60 MG/M2 on day 1 and etoposide 120 MG/M2 on days 1, 2 and 3 status post 1 cycle.  CURRENT THERAPY: Systemic chemotherapy with carboplatin for AUC of 4 on day 1 and etoposide 100 MG/M2 on days 1, 2 and 3 every 3 weeks. First dose 11/11/2016.  INTERVAL HISTORY: Rebekah Henderson 60 y.o. female returns to the clinic today for follow-up visit accompanied by a friend. The patient is feeling a little bit better today. She had several complications after the first cycle of her systemic chemotherapy with cisplatin and etoposide including nausea, vomiting, lack by mouth intake and dehydration. She also developed multiple areas of skin rash on the upper and lower extremities with plaque-like formation. She was admitted to the hospital a few times in the last few weeks because of her worsening condition. Biopsy of the rash showed dermatitis questionable to be drug related. She is feeling a little bit better today. She is a need to resume her systemic therapy. This was concurrent with radiation given in [facility].  MEDICAL HISTORY: Past Medical History:  Diagnosis Date  . Anxiety    takes Prozac daily  . Arthritis   . Arthritis   . Cancer ([place])    skin, lung  . Colon polyps   . COPD (chronic obstructive pulmonary disease) ([place])   . Diverticulitis   . Dyspnea    with exertion  . Fibromyalgia   . GERD (gastroesophageal reflux disease)    takes Pantoprazole daily  . Hypertension    takes Metoprolol,Triamterene-HCTZ,and Amlodipine daily  . Hypothyroidism    takes Synthroid daily  . IBS (irritable  bowel syndrome)   . PONV (postoperative nausea and vomiting)    pt also states that she had some difficulty breathing after cervical fusion    ALLERGIES:  is allergic to penicillins; codeine; ranitidine hcl; keflex [cephalexin]; and lyrica [pregabalin].  MEDICATIONS:  Current Outpatient Prescriptions  Medication Sig Dispense Refill  . albuterol (PROVENTIL) 2 MG tablet Take 2 mg by mouth daily as needed for wheezing or shortness of breath.     Marland Kitchen amLODipine (NORVASC) 5 MG tablet Take 5 mg by mouth daily.      Marland Kitchen aspirin EC 81 MG tablet Take 81 mg by mouth every other day.     . bacitracin ointment Apply topically as needed for wound care (skin biopsy). 120 g 0  . estazolam (PROSOM) 2 MG tablet Take 2 mg by mouth at bedtime.    Marland Kitchen estradiol (ESTRACE) 2 MG tablet Take 2 mg by mouth daily.      Marland Kitchen FLUoxetine (PROZAC) 40 MG capsule Take 40 mg by mouth daily.   2  . HYDROcodone-acetaminophen (NORCO/VICODIN) 5-325 MG tablet Take 1 tablet by mouth every 4 (four) hours as needed for moderate pain.   0  . levothyroxine (SYNTHROID, LEVOTHROID) 25 MCG tablet Take 25 mcg by mouth daily.      Marland Kitchen loperamide (IMODIUM) 2 MG capsule Take 2 mg by mouth every 2 (two) hours as needed for diarrhea or loose stools.    . magnesium oxide (MAG-OX) 400 (241.3 Mg) MG tablet Take 1 tablet (400 mg total) by  mouth 3 (three) times daily. 90 tablet 0  . metoprolol succinate (TOPROL-XL) 25 MG 24 hr tablet Take 25 mg by mouth daily.    . Multiple Vitamin (MULTIVITAMIN WITH MINERALS) TABS tablet Take 2 tablets by mouth daily.    . ondansetron (ZOFRAN) 8 MG tablet Take 1 tablet (8 mg total) by mouth 3 (three) times daily as needed for nausea or vomiting. 30 tablet 0  . oxyCODONE-acetaminophen (PERCOCET/ROXICET) 5-325 MG tablet Take 1-2 tablets by mouth every 4 (four) hours as needed for severe pain. 20 tablet 0  . potassium chloride SA (K-DUR,KLOR-CON) 20 MEQ tablet Take 1 tablet (20 mEq total) by mouth daily. 10 tablet 0  .  predniSONE (DELTASONE) 5 MG tablet Take 10 tablets (50 mg total) by mouth daily with breakfast. Taper down prednisone starting from 50 mg a day, taper down by 5 mg a day down to 0 mg. for ex, today 50 mg, tomorrow 45 mg, then 40 mg the following day and etc... 55 tablet 0  . PROAIR HFA 108 (90 BASE) MCG/ACT inhaler Inhale 1-2 puffs into the lungs every 4 (four) hours as needed.   0  . prochlorperazine (COMPAZINE) 10 MG tablet Take 1 tablet (10 mg total) by mouth every 6 (six) hours as needed for nausea or vomiting. 30 tablet 0  . triamterene-hydrochlorothiazide (DYAZIDE) 37.5-25 MG per capsule Take 1 capsule by mouth every morning.       No current facility-administered medications for this visit.     SURGICAL HISTORY:  Past Surgical History:  Procedure Laterality Date  . BIOPSY N/A 05/25/2013   Procedure: BIOPSIES (Random Colon; Duodenal; Gastric);  Surgeon: Danie Binder, MD;  Location: [facility];  Service: Endoscopy;  Laterality: N/A;  . BLADDER SUSPENSION    . BREAST ENHANCEMENT SURGERY    . BREAST IMPLANT REMOVAL    . CERVICAL FUSION  AUG 2013  . CHOLECYSTECTOMY  1999  . COLONOSCOPY  2007 [facility]   POLYPS  . COLONOSCOPY WITH PROPOFOL N/A 05/25/2013   Procedure: COLONOSCOPY WITH PROPOFOL(at cecum 0957) total withdrawal time=78mn);  Surgeon: SDanie Binder MD;  Location: [facility];  Service: Endoscopy;  Laterality: N/A;  . ESOPHAGOGASTRODUODENOSCOPY (EGD) WITH PROPOFOL N/A 05/25/2013   Procedure: ESOPHAGOGASTRODUODENOSCOPY (EGD) WITH PROPOFOL;  Surgeon: SDanie Binder MD;  Location: [facility];  Service: Endoscopy;  Laterality: N/A;  . FOOT SURGERY    . POLYPECTOMY N/A 05/25/2013   Procedure: POLYPECTOMY (Rectal and Gastric);  Surgeon: SDanie Binder MD;  Location: [facility];  Service: Endoscopy;  Laterality: N/A;  . TONSILLECTOMY    . UPPER GASTROINTESTINAL ENDOSCOPY    . VIDEO BRONCHOSCOPY WITH ENDOBRONCHIAL ULTRASOUND  09/12/2016   Procedure: VIDEO BRONCHOSCOPY WITH ENDOBRONCHIAL ULTRASOUND  AND BIOPSY;  Surgeon: DJuanito Doom MD;  Location: [facility];  Service: Cardiopulmonary;;    REVIEW OF SYSTEMS:  Constitutional: negative Eyes: negative Ears, nose, mouth, throat, and face: negative Respiratory: positive for dyspnea on exertion Cardiovascular: negative for dyspnea, orthopnea and palpitations Gastrointestinal: negative for constipation, diarrhea, nausea and vomiting Genitourinary:negative for dysuria, frequency and hematuria Integument/breast: positive for dryness and rash Hematologic/lymphatic: negative for bleeding, easy bruising and lymphadenopathy Musculoskeletal:negative Neurological: negative Behavioral/Psych: negative Endocrine: negative Allergic/Immunologic: negative   PHYSICAL EXAMINATION: General appearance: alert, cooperative and no distress Head: Normocephalic, without obvious abnormality, atraumatic Neck: no adenopathy, no JVD, supple, symmetrical, trachea midline and thyroid not enlarged, symmetric, no tenderness/mass/nodules Lymph nodes: Cervical, supraclavicular, and axillary nodes normal. Resp: clear to auscultation bilaterally Back: symmetric, no curvature.  ROM normal. No CVA tenderness. Cardio: regular rate and rhythm, S1, S2 normal, no murmur, click, rub or gallop GI: soft, non-tender; bowel sounds normal; no masses,  no organomegaly Extremities: extremities normal, atraumatic, no cyanosis or edema and Multiple areas of skin plaques. Neurologic: Alert and oriented X 3, normal strength and tone. Normal symmetric reflexes. Normal coordination and gait  ECOG PERFORMANCE STATUS: 1 - Symptomatic but completely ambulatory  Blood pressure (!) 141/67, pulse 64, temperature 97.8 F (36.6 C), temperature source Oral, resp. rate 18, height '5\' 4"'$  (1.626 m), weight 176 lb 11.2 oz (80.2 kg), SpO2 97 %.  LABORATORY DATA: Lab Results  Component Value Date   WBC 13.7 (H) 11/11/2016   HGB 12.5 11/11/2016   HCT 38.9 11/11/2016   MCV 88.4 11/11/2016   PLT 409  (H) 11/11/2016      Chemistry      Component Value Date/Time   NA 140 11/02/2016 0352   NA 136 10/21/2016 0942   K 4.2 11/02/2016 0352   K 3.1 (L) 10/21/2016 0942   CL 108 11/02/2016 0352   CO2 24 11/02/2016 0352   CO2 22 10/21/2016 0942   BUN <5 (L) 11/02/2016 0352   BUN 5.6 (L) 10/21/2016 0942   CREATININE 0.56 11/02/2016 0352   CREATININE 0.7 10/21/2016 0942      Component Value Date/Time   CALCIUM 8.3 (L) 11/02/2016 0352   CALCIUM 9.4 10/21/2016 0942   ALKPHOS 41 10/30/2016 0409   ALKPHOS 66 10/21/2016 0942   AST 16 10/30/2016 0409   AST 9 10/21/2016 0942   ALT 9 (L) 10/30/2016 0409   ALT 6 10/21/2016 0942   BILITOT 1.0 10/30/2016 0409   BILITOT 0.48 10/21/2016 0942       RADIOGRAPHIC STUDIES: Dg Chest 2 View  Result Date: 10/19/2016 CLINICAL DATA:  Shortness of breath, chest pain EXAM: CHEST  2 VIEW COMPARISON:  10/15/2016 FINDINGS: Mild peribronchial thickening. Heart and mediastinal contours are within normal limits. No focal opacities or effusions. No acute bony abnormality. IMPRESSION: Mild bronchitic changes. Electronically Signed   By: Rolm Baptise M.D.   On: 10/19/2016 13:21   Dg Chest 2 View  Result Date: 10/15/2016 CLINICAL DATA:  Onset of fever with nausea vomiting and diarrhea last night. Patient is undergoing treatment for lung malignancy. Patient is a former smoker with history of COPD EXAM: CHEST  2 VIEW COMPARISON:  PA and lateral chest x-ray of August 27, 2016 FINDINGS: The lungs are mildly hyperinflated. There is no focal infiltrate. There is persistent increased density at the right lung base which follows the contour of the hemidiaphragm. This is new since October 2016 but not significantly changed since August 27, 2016. Soft tissue fullness in the AP window region persists. There is faint calcification in the wall of the aortic arch. The heart and pulmonary vascularity are normal. The observed bony thorax exhibits no acute abnormality. The  patient has undergone previous lower anterior cervical fusion. IMPRESSION: COPD. Persistent increased density at the right lung base. Persistent AP window lymphadenopathy. Chest CT scanning is recommended for further evaluation of the mediastinum and right lower lung. Thoracic aortic atherosclerosis. Electronically Signed   By: David  Martinique M.D.   On: 10/15/2016 10:37   Dg Abd Portable 1v  Result Date: 10/16/2016 CLINICAL DATA:  Nausea and diarrhea EXAM: PORTABLE ABDOMEN - 1 VIEW COMPARISON:  PET-CT 09/27/2016 FINDINGS: No dilated loops of large or small bowel. Gas and stool in the rectum. No pathologic calcifications. No organomegaly. No  acute osseous abnormality. Postcholecystectomy. IMPRESSION: No acute abdominal findings. Electronically Signed   By: Suzy Bouchard M.D.   On: 10/16/2016 07:44    ASSESSMENT AND PLAN: This is a very pleasant 60 years old white female with: 1) Limited stage small cell lung cancer: She is status post 1 cycle of systemic chemotherapy with cisplatin and etoposide. The patient did not tolerate this treatment well with significant nausea, vomiting, dehydration, neutropenia as well as extensive skin rash and it was discontinued. I had a lengthy discussion with the patient today about her condition. I recommended for her to change her systemic chemotherapy to carboplatin for AUC of 4 on day 1 and etoposide 100 MG/M2 on days 1, 2 and 3 with Neulasta support because of the febrile neutropenia with the previous cycle. I discussed with the patient adverse effect of this treatment including but not limited to alopecia, myelosuppression, nausea and vomiting, peripheral neuropathy, liver or renal dysfunction. She is expected to start the first cycle of this treatment today. She will come back for follow-up visit in 3 weeks for evaluation before starting cycle #3. 2) diarrhea induced dermatitis: It is improving. The patient will continue the tapering dose of prednisone for now. She  was also advised to apply topical hydrocortisone cream. 3) nausea, vomiting and dehydration: Significantly improved. 4) COPD: The patient will continue with her current treatment with ProAir and albuterol. 5) hypertension: Stable. She is currently on treatment with Toprol-XL and Dyazide. The patient was advised to call immediately if she has any concerning symptoms in the interval. The patient voices understanding of current disease status and treatment options and is in agreement with the current care plan.  All questions were answered. The patient knows to call the clinic with any problems, questions or concerns. We can certainly see the patient much sooner if necessary.  Disclaimer: This note was dictated with voice recognition software. Similar sounding words can inadvertently be transcribed and may not be corrected upon review.

## 2016-11-11 NOTE — Progress Notes (Signed)
Counseling intern met with patient during infusion treatment. Patient appeared alert and oriented and communicated clearly with counselor throughout session. Patient stated she felt "a lot better" (rating of 6 out of 10) after having had a productive conversation with her physician who had answered her questions and indicated her treatment time would be shorter than she had anticipated. Her treatment had been altered to reduce the negative effects she had experienced at the time of her hospital admission.                         Patient described feeling overwhelmed by the cancer diagnosis while simultaneously dealing with the strained relationship with son, with whom she stated she was very close until he was married 4 years ago. She described feeling lonely and having many "what if" questions about how her life will be different going forward. Patient described having previously spent much of her life helping others and that she does not want to be a burden to others now that she is ill herself. Counselor suggested that allowing others to support her might be a way to give them a chance to experience being of service.  Patient described relying on her faith and having an a number of friends, including her ex-husband and her two dogs (one of which has undergone surgery for cancer) as sources of comfort. Counselor reviewed deep breathing, mindfulness, and visualization techniques patient can practice when she begins to feel overwhelmed and anxious.  Lamount Cohen, Counseling Intern Department for Spiritual Care and [facility] Supervisor - Scientist, research (physical sciences)

## 2016-11-12 ENCOUNTER — Ambulatory Visit: Payer: Medicare Other

## 2016-11-12 ENCOUNTER — Ambulatory Visit (HOSPITAL_BASED_OUTPATIENT_CLINIC_OR_DEPARTMENT_OTHER): Payer: Medicare Other

## 2016-11-12 VITALS — BP 135/76 | HR 72 | Temp 97.9°F | Resp 18

## 2016-11-12 DIAGNOSIS — R0602 Shortness of breath: Secondary | ICD-10-CM | POA: Diagnosis not present

## 2016-11-12 DIAGNOSIS — C3432 Malignant neoplasm of lower lobe, left bronchus or lung: Secondary | ICD-10-CM | POA: Diagnosis not present

## 2016-11-12 DIAGNOSIS — Z5111 Encounter for antineoplastic chemotherapy: Secondary | ICD-10-CM

## 2016-11-12 DIAGNOSIS — C3492 Malignant neoplasm of unspecified part of left bronchus or lung: Secondary | ICD-10-CM

## 2016-11-12 DIAGNOSIS — Z510 Encounter for antineoplastic radiation therapy: Secondary | ICD-10-CM | POA: Diagnosis not present

## 2016-11-12 MED ORDER — DEXAMETHASONE SODIUM PHOSPHATE 10 MG/ML IJ SOLN
10.0000 mg | Freq: Once | INTRAMUSCULAR | Status: AC
  Administered 2016-11-12: 10 mg via INTRAVENOUS

## 2016-11-12 MED ORDER — SODIUM CHLORIDE 0.9 % IV SOLN
Freq: Once | INTRAVENOUS | Status: AC
  Administered 2016-11-12: 14:00:00 via INTRAVENOUS

## 2016-11-12 MED ORDER — DEXAMETHASONE SODIUM PHOSPHATE 10 MG/ML IJ SOLN
INTRAMUSCULAR | Status: AC
  Filled 2016-11-12: qty 1

## 2016-11-12 MED ORDER — SODIUM CHLORIDE 0.9 % IV SOLN
100.0000 mg/m2 | Freq: Once | INTRAVENOUS | Status: AC
  Administered 2016-11-12: 190 mg via INTRAVENOUS
  Filled 2016-11-12: qty 9.5

## 2016-11-12 NOTE — Progress Notes (Signed)
Nutrition Assessment   Reason for Assessment:   Patient seen secondary to having score of 2 or greater on  Malnutrition Screening Report  ASSESSMENT:  60 year old female with lung cancer. Patient has received first round of chemotherapy but did not tolerate well secondary to nausea, vomiting, dehydration. Seen in infusion for 2nd dose of chemotherapy but regimen has been changed.   Past medical history of COPD, diverticulitis, GERD, IBS  Patient reports that over the past several weeks appetite has improved.  "I am taking predinsone.  Patient eating chips during visit this pm.  Patient reports that she eats about 2 meals per day but has frequent snacks during the day.     Medications: MVI, zofran, compazine, KCL  Labs: reviewed  Anthropometrics:   Height: 64 inches Weight: 176 lb BMI: 30  Noted weight loss of 3% weight loss in the last month  Estimated Energy Needs  Kcals: 2000-2400 calories/d Protein: 82-98 g/protein Fluid: 2.4  NUTRITION DIAGNOSIS: Inadequate oral intake related to cancer related treatment as evidenced by 3% weight loss in the last month    INTERVENTION:   Discussed ways to increase calories and protein. Discussed easy to prepare nutritional dense snacks that patient can snack on during the day.  Discussed oral nutrition supplement as options for additional calories and protein  MONITORING, EVALUATION, GOAL: Patient will consume adequate calories and protein to maintain weight and preserve lean body mass.   NEXT VISIT:  As needed  Lucio Litsey B. Zenia Resides, [place], [place] (pager)

## 2016-11-12 NOTE — Patient Instructions (Signed)
[facility] Discharge Instructions for Patients Receiving Chemotherapy  Today you received the following chemotherapy agents: Etoposide   To help prevent nausea and vomiting after your treatment, we encourage you to take your nausea medication as directed.    If you develop nausea and vomiting that is not controlled by your nausea medication, call the clinic.   BELOW ARE SYMPTOMS THAT SHOULD BE REPORTED IMMEDIATELY:  *FEVER GREATER THAN 100.5 F  *CHILLS WITH OR WITHOUT FEVER  NAUSEA AND VOMITING THAT IS NOT CONTROLLED WITH YOUR NAUSEA MEDICATION  *UNUSUAL SHORTNESS OF BREATH  *UNUSUAL BRUISING OR BLEEDING  TENDERNESS IN MOUTH AND THROAT WITH OR WITHOUT PRESENCE OF ULCERS  *URINARY PROBLEMS  *BOWEL PROBLEMS  UNUSUAL RASH Items with * indicate a potential emergency and should be followed up as soon as possible.  Feel free to call the clinic you have any questions or concerns. The clinic phone number is [phone].  Please show the CHEMO ALERT CARD at check-in to the Emergency Department and triage nurse.   

## 2016-11-13 ENCOUNTER — Ambulatory Visit (HOSPITAL_BASED_OUTPATIENT_CLINIC_OR_DEPARTMENT_OTHER): Payer: Medicare Other

## 2016-11-13 ENCOUNTER — Telehealth: Payer: Self-pay | Admitting: Internal Medicine

## 2016-11-13 VITALS — BP 136/82 | HR 75 | Temp 97.6°F | Resp 16

## 2016-11-13 DIAGNOSIS — Z5111 Encounter for antineoplastic chemotherapy: Secondary | ICD-10-CM | POA: Diagnosis not present

## 2016-11-13 DIAGNOSIS — C3432 Malignant neoplasm of lower lobe, left bronchus or lung: Secondary | ICD-10-CM | POA: Diagnosis not present

## 2016-11-13 DIAGNOSIS — C3492 Malignant neoplasm of unspecified part of left bronchus or lung: Secondary | ICD-10-CM

## 2016-11-13 MED ORDER — DEXAMETHASONE SODIUM PHOSPHATE 10 MG/ML IJ SOLN
10.0000 mg | Freq: Once | INTRAMUSCULAR | Status: AC
  Administered 2016-11-13: 10 mg via INTRAVENOUS

## 2016-11-13 MED ORDER — SODIUM CHLORIDE 0.9 % IV SOLN
Freq: Once | INTRAVENOUS | Status: AC
  Administered 2016-11-13: 14:00:00 via INTRAVENOUS

## 2016-11-13 MED ORDER — SODIUM CHLORIDE 0.9 % IV SOLN
100.0000 mg/m2 | Freq: Once | INTRAVENOUS | Status: AC
  Administered 2016-11-13: 190 mg via INTRAVENOUS
  Filled 2016-11-13: qty 9.5

## 2016-11-13 MED ORDER — DEXAMETHASONE SODIUM PHOSPHATE 10 MG/ML IJ SOLN
INTRAMUSCULAR | Status: AC
  Filled 2016-11-13: qty 1

## 2016-11-13 NOTE — Telephone Encounter (Signed)
sw with pt to confirm it is ok for her to get lab only appts at [facility] due to pt living near by

## 2016-11-13 NOTE — Patient Instructions (Signed)
[facility] Discharge Instructions for Patients Receiving Chemotherapy  Today you received the following chemotherapy agents: Etoposide   To help prevent nausea and vomiting after your treatment, we encourage you to take your nausea medication as directed.    If you develop nausea and vomiting that is not controlled by your nausea medication, call the clinic.   BELOW ARE SYMPTOMS THAT SHOULD BE REPORTED IMMEDIATELY:  *FEVER GREATER THAN 100.5 F  *CHILLS WITH OR WITHOUT FEVER  NAUSEA AND VOMITING THAT IS NOT CONTROLLED WITH YOUR NAUSEA MEDICATION  *UNUSUAL SHORTNESS OF BREATH  *UNUSUAL BRUISING OR BLEEDING  TENDERNESS IN MOUTH AND THROAT WITH OR WITHOUT PRESENCE OF ULCERS  *URINARY PROBLEMS  *BOWEL PROBLEMS  UNUSUAL RASH Items with * indicate a potential emergency and should be followed up as soon as possible.  Feel free to call the clinic you have any questions or concerns. The clinic phone number is [phone].  Please show the CHEMO ALERT CARD at check-in to the Emergency Department and triage nurse.   

## 2016-11-15 ENCOUNTER — Ambulatory Visit (HOSPITAL_BASED_OUTPATIENT_CLINIC_OR_DEPARTMENT_OTHER): Payer: Medicare Other

## 2016-11-15 VITALS — BP 124/86 | HR 90 | Temp 97.8°F | Resp 18

## 2016-11-15 DIAGNOSIS — C3432 Malignant neoplasm of lower lobe, left bronchus or lung: Secondary | ICD-10-CM | POA: Diagnosis not present

## 2016-11-15 DIAGNOSIS — C3492 Malignant neoplasm of unspecified part of left bronchus or lung: Secondary | ICD-10-CM

## 2016-11-15 MED ORDER — PEGFILGRASTIM INJECTION 6 MG/0.6ML ~~LOC~~
6.0000 mg | PREFILLED_SYRINGE | Freq: Once | SUBCUTANEOUS | Status: AC
  Administered 2016-11-15: 6 mg via SUBCUTANEOUS
  Filled 2016-11-15: qty 0.6

## 2016-11-15 NOTE — Patient Instructions (Signed)
Pegfilgrastim injection What is this medicine? PEGFILGRASTIM (PEG fil gra stim) is a long-acting granulocyte colony-stimulating factor that stimulates the growth of neutrophils, a type of white blood cell important in the body's fight against infection. It is used to reduce the incidence of fever and infection in patients with certain types of cancer who are receiving chemotherapy that affects the bone marrow, and to increase survival after being exposed to high doses of radiation. This medicine may be used for other purposes; ask your health care provider or pharmacist if you have questions. COMMON BRAND NAME(S): Neulasta What should I tell my health care provider before I take this medicine? They need to know if you have any of these conditions: -kidney disease -latex allergy -ongoing radiation therapy -sickle cell disease -skin reactions to acrylic adhesives (On-Body Injector only) -an unusual or allergic reaction to pegfilgrastim, filgrastim, other medicines, foods, dyes, or preservatives -pregnant or trying to get pregnant -breast-feeding How should I use this medicine? This medicine is for injection under the skin. If you get this medicine at home, you will be taught how to prepare and give the pre-filled syringe or how to use the On-body Injector. Refer to the patient Instructions for Use for detailed instructions. Use exactly as directed. Take your medicine at regular intervals. Do not take your medicine more often than directed. It is important that you put your used needles and syringes in a special sharps container. Do not put them in a trash can. If you do not have a sharps container, call your pharmacist or healthcare provider to get one. Talk to your pediatrician regarding the use of this medicine in children. While this drug may be prescribed for selected conditions, precautions do apply. Overdosage: If you think you have taken too much of this medicine contact a poison control  center or emergency room at once. NOTE: This medicine is only for you. Do not share this medicine with others. What if I miss a dose? It is important not to miss your dose. Call your doctor or health care professional if you miss your dose. If you miss a dose due to an On-body Injector failure or leakage, a new dose should be administered as soon as possible using a single prefilled syringe for manual use. What may interact with this medicine? Interactions have not been studied. Give your health care provider a list of all the medicines, herbs, non-prescription drugs, or dietary supplements you use. Also tell them if you smoke, drink alcohol, or use illegal drugs. Some items may interact with your medicine. This list may not describe all possible interactions. Give your health care provider a list of all the medicines, herbs, non-prescription drugs, or dietary supplements you use. Also tell them if you smoke, drink alcohol, or use illegal drugs. Some items may interact with your medicine. What should I watch for while using this medicine? You may need blood work done while you are taking this medicine. If you are going to need a MRI, CT scan, or other procedure, tell your doctor that you are using this medicine (On-Body Injector only). What side effects may I notice from receiving this medicine? Side effects that you should report to your doctor or health care professional as soon as possible: -allergic reactions like skin rash, itching or hives, swelling of the face, lips, or tongue -dizziness -fever -pain, redness, or irritation at site where injected -pinpoint red spots on the skin -red or dark-brown urine -shortness of breath or breathing problems -stomach or   side pain, or pain at the shoulder -swelling -tiredness -trouble passing urine or change in the amount of urine Side effects that usually do not require medical attention (report to your doctor or health care professional if they  continue or are bothersome): -bone pain -muscle pain This list may not describe all possible side effects. Call your doctor for medical advice about side effects. You may report side effects to FDA at 1-800-FDA-1088. Where should I keep my medicine? Keep out of the reach of children. Store pre-filled syringes in a refrigerator between 2 and 8 degrees C (36 and 46 degrees F). Do not freeze. Keep in carton to protect from light. Throw away this medicine if it is left out of the refrigerator for more than 48 hours. Throw away any unused medicine after the expiration date. NOTE: This sheet is a summary. It may not cover all possible information. If you have questions about this medicine, talk to your doctor, pharmacist, or health care provider.  2017 Elsevier/Gold Standard (2014-12-08 14:30:14)  

## 2016-11-18 ENCOUNTER — Telehealth: Payer: Self-pay | Admitting: *Deleted

## 2016-11-18 ENCOUNTER — Telehealth: Payer: Self-pay | Admitting: Medical Oncology

## 2016-11-18 ENCOUNTER — Other Ambulatory Visit: Payer: Medicare Other

## 2016-11-18 DIAGNOSIS — R112 Nausea with vomiting, unspecified: Secondary | ICD-10-CM

## 2016-11-18 DIAGNOSIS — R0602 Shortness of breath: Secondary | ICD-10-CM

## 2016-11-18 MED ORDER — METHYLPREDNISOLONE 4 MG PO TBPK: ORAL_TABLET | ORAL | 0 refills | Status: DC

## 2016-11-18 MED ORDER — ONDANSETRON HCL 8 MG PO TABS
8.0000 mg | ORAL_TABLET | Freq: Three times a day (TID) | ORAL | 0 refills | Status: DC | PRN
Start: 2016-11-18 — End: 2017-01-20

## 2016-11-18 NOTE — Telephone Encounter (Signed)
after injection on Friday she experienced nausea and had trouble breathing. She also had diarrhea and  vomiting and diarrhea, She took imodium and zofran and needs refill. Yesterday her feet started burning . I instructed pt to come today for appt or go to nearest ED. She said she has no transportation and does not want to call EMS. Per Julien Nordmann I told pt medrol dose pak sent to local pharmacy and zofran refill-if symptoms do not improve she needs to go to ED.

## 2016-11-18 NOTE — Telephone Encounter (Signed)
"  I've called switchboard this weekend because the shot I received Friday messed me up.  I've had nausea, vomiting, diarrhea and my feet started burning  Again.  I was told prednisone would be ordered if this happened again.  I also need zofran.  Return number [phone]."  Returned call to patient who reports she also is "short of breath after receiving the shot.  Wearing oxygen all weekend at 2L O2 Sat = 92%.  I can't walk across the floor, can't drive so I won't be in for radiation.  Prednisone will help my breathing and the burning in my feet.  One diarrhea stool this morning so I took imodium.  Three stools Sunday and two Friday and Saturday.  Today I've eaten a sandwich and drinking juice.  Yesterday I ate chicken and dumplings and kept it down.  Vomited once yesterday, once Saturday and Friday night a few times brownish green.  Taking zofran and need a refill.  I'm drinking water, juice and everything I can.  [employer] delivers.  I do not have a fever."

## 2016-11-19 ENCOUNTER — Encounter (HOSPITAL_COMMUNITY)
Admission: RE | Admit: 2016-11-19 | Discharge: 2016-11-19 | Disposition: A | Discharge status: Home / Self Care | Payer: Medicare Other | Source: Ambulatory Visit | Attending: Internal Medicine | Admitting: Internal Medicine

## 2016-11-19 DIAGNOSIS — C3492 Malignant neoplasm of unspecified part of left bronchus or lung: Secondary | ICD-10-CM | POA: Diagnosis not present

## 2016-11-19 LAB — CBC WITH DIFFERENTIAL/PLATELET
BASOS ABS: 0 10*3/uL (ref 0.0–0.1)
Basophils Relative: 0 %
Eosinophils Absolute: 0 10*3/uL (ref 0.0–0.7)
Eosinophils Relative: 0 %
HEMATOCRIT: 33.2 % — AB (ref 36.0–46.0)
Hemoglobin: 11.1 g/dL — ABNORMAL LOW (ref 12.0–15.0)
LYMPHS ABS: 0.3 10*3/uL — AB (ref 0.7–4.0)
LYMPHS PCT: 15 %
MCH: 29.5 pg (ref 26.0–34.0)
MCHC: 33.4 g/dL (ref 30.0–36.0)
MCV: 88.3 fL (ref 78.0–100.0)
MONOS PCT: 6 %
Monocytes Absolute: 0.1 10*3/uL (ref 0.1–1.0)
Neutro Abs: 1.7 10*3/uL (ref 1.7–7.7)
Neutrophils Relative %: 79 %
Platelets: 220 10*3/uL (ref 150–400)
RBC: 3.76 MIL/uL — AB (ref 3.87–5.11)
RDW: 14.7 % (ref 11.5–15.5)
WBC: 2.1 10*3/uL — AB (ref 4.0–10.5)

## 2016-11-19 LAB — COMPREHENSIVE METABOLIC PANEL
ALBUMIN: 3.5 g/dL (ref 3.5–5.0)
ALK PHOS: 57 U/L (ref 38–126)
ALT: 16 U/L (ref 14–54)
AST: 20 U/L (ref 15–41)
Anion gap: 12 (ref 5–15)
BUN: 17 mg/dL (ref 6–20)
CALCIUM: 9.4 mg/dL (ref 8.9–10.3)
CHLORIDE: 100 mmol/L — AB (ref 101–111)
CO2: 24 mmol/L (ref 22–32)
CREATININE: 0.75 mg/dL (ref 0.44–1.00)
GFR calc non Af Amer: >60 mL/min (ref >60)
GLUCOSE: 129 mg/dL — AB (ref 65–99)
Potassium: 3.8 mmol/L (ref 3.5–5.1)
SODIUM: 136 mmol/L (ref 135–145)
Total Bilirubin: 0.8 mg/dL (ref 0.3–1.2)
Total Protein: 7.4 g/dL (ref 6.5–8.1)

## 2016-11-26 ENCOUNTER — Telehealth: Payer: Self-pay | Admitting: *Deleted

## 2016-11-26 ENCOUNTER — Other Ambulatory Visit: Payer: Medicare Other

## 2016-11-26 ENCOUNTER — Encounter (HOSPITAL_COMMUNITY)
Admission: RE | Admit: 2016-11-26 | Discharge: 2016-11-26 | Disposition: A | Discharge status: Home / Self Care | Payer: Medicare Other | Source: Ambulatory Visit | Attending: Internal Medicine | Admitting: Internal Medicine

## 2016-11-26 DIAGNOSIS — C3492 Malignant neoplasm of unspecified part of left bronchus or lung: Secondary | ICD-10-CM | POA: Diagnosis not present

## 2016-11-26 LAB — COMPREHENSIVE METABOLIC PANEL
ALBUMIN: 3.6 g/dL (ref 3.5–5.0)
ALK PHOS: 48 U/L (ref 38–126)
ALT: 15 U/L (ref 14–54)
ANION GAP: 10 (ref 5–15)
AST: 18 U/L (ref 15–41)
BILIRUBIN TOTAL: 0.6 mg/dL (ref 0.3–1.2)
BUN: 10 mg/dL (ref 6–20)
CO2: 28 mmol/L (ref 22–32)
Calcium: 9.1 mg/dL (ref 8.9–10.3)
Chloride: 99 mmol/L — ABNORMAL LOW (ref 101–111)
Creatinine, Ser: 0.81 mg/dL (ref 0.44–1.00)
Glucose, Bld: 116 mg/dL — ABNORMAL HIGH (ref 65–99)
POTASSIUM: 3.5 mmol/L (ref 3.5–5.1)
Sodium: 137 mmol/L (ref 135–145)
TOTAL PROTEIN: 7.1 g/dL (ref 6.5–8.1)

## 2016-11-26 LAB — CBC WITH DIFFERENTIAL/PLATELET
Basophils Absolute: 0.1 10*3/uL (ref 0.0–0.1)
Basophils Relative: 1 %
EOS PCT: 0 %
Eosinophils Absolute: 0 10*3/uL (ref 0.0–0.7)
HEMATOCRIT: 34.6 % — AB (ref 36.0–46.0)
Hemoglobin: 11.5 g/dL — ABNORMAL LOW (ref 12.0–15.0)
LYMPHS ABS: 2.2 10*3/uL (ref 0.7–4.0)
Lymphocytes Relative: 15 %
MCH: 29.9 pg (ref 26.0–34.0)
MCHC: 33.2 g/dL (ref 30.0–36.0)
MCV: 90.1 fL (ref 78.0–100.0)
MONO ABS: 1.5 10*3/uL — AB (ref 0.1–1.0)
Monocytes Relative: 10 %
NEUTROS ABS: 10.7 10*3/uL — AB (ref 1.7–7.7)
Neutrophils Relative %: 74 %
Platelets: 79 10*3/uL — ABNORMAL LOW (ref 150–400)
RBC: 3.84 MIL/uL — AB (ref 3.87–5.11)
RDW: 15.8 % — AB (ref 11.5–15.5)
WBC Morphology: INCREASED
WBC: 14.5 10*3/uL — AB (ref 4.0–10.5)

## 2016-11-26 NOTE — Telephone Encounter (Signed)
Verbal order received and read back from Dr. Julien Nordmann for no renewal of steroids.  See Selena Lesser for evaluation. She then can decide if any further orders are needed.  Order given to Ms. Branden at this time.  "I have Radiation in [place] at 12:15 pm.  I drive here which is ten minutes away.  I'll need to find a driver to come to [place].  Schedule me for the 2:00 pm slot."  Asked for a call in the morning if her schedule changes.

## 2016-11-26 NOTE — Telephone Encounter (Signed)
"  Will Dr. Julien Nordmann order more steroids for me.  Now that I stopped them, I have to wear the oxygen all day at 2 L and I was only wearing it at night.  Since finishing them on Sunday, I've gradually had more trouble breathing and Congestion.  My legs are swelling and I'm more congested.  No cough or fever.  Return home number is [phone]."

## 2016-11-27 ENCOUNTER — Telehealth: Payer: Self-pay | Admitting: Internal Medicine

## 2016-11-27 ENCOUNTER — Encounter: Payer: Medicare Other | Admitting: Nurse Practitioner

## 2016-11-27 NOTE — Telephone Encounter (Signed)
Today's appointment with Rebekah Henderson, was cancelled, per patient request. Patient called and stated that she no longer need the appointment and that she would like to cancel. 11/27/16

## 2016-11-28 ENCOUNTER — Encounter: Payer: Self-pay | Admitting: *Deleted

## 2016-11-28 ENCOUNTER — Other Ambulatory Visit (HOSPITAL_COMMUNITY): Payer: Self-pay | Admitting: Radiation Oncology

## 2016-11-28 DIAGNOSIS — C3432 Malignant neoplasm of lower lobe, left bronchus or lung: Secondary | ICD-10-CM

## 2016-11-29 ENCOUNTER — Other Ambulatory Visit (HOSPITAL_COMMUNITY): Payer: Self-pay | Admitting: Radiation Oncology

## 2016-11-29 DIAGNOSIS — C3432 Malignant neoplasm of lower lobe, left bronchus or lung: Secondary | ICD-10-CM

## 2016-12-01 DIAGNOSIS — J449 Chronic obstructive pulmonary disease, unspecified: Secondary | ICD-10-CM | POA: Diagnosis not present

## 2016-12-01 DIAGNOSIS — R0902 Hypoxemia: Secondary | ICD-10-CM | POA: Diagnosis not present

## 2016-12-03 ENCOUNTER — Other Ambulatory Visit: Payer: Self-pay | Admitting: Internal Medicine

## 2016-12-03 ENCOUNTER — Telehealth: Payer: Self-pay | Admitting: Internal Medicine

## 2016-12-03 ENCOUNTER — Ambulatory Visit (HOSPITAL_COMMUNITY): Payer: Medicare Other

## 2016-12-03 ENCOUNTER — Ambulatory Visit (HOSPITAL_BASED_OUTPATIENT_CLINIC_OR_DEPARTMENT_OTHER): Payer: Medicare Other | Admitting: Internal Medicine

## 2016-12-03 ENCOUNTER — Ambulatory Visit (HOSPITAL_BASED_OUTPATIENT_CLINIC_OR_DEPARTMENT_OTHER): Payer: Medicare Other

## 2016-12-03 ENCOUNTER — Other Ambulatory Visit (HOSPITAL_BASED_OUTPATIENT_CLINIC_OR_DEPARTMENT_OTHER): Payer: Medicare Other

## 2016-12-03 ENCOUNTER — Encounter: Payer: Self-pay | Admitting: Internal Medicine

## 2016-12-03 VITALS — BP 123/100 | HR 107 | Temp 98.4°F | Resp 18 | Wt 177.0 lb

## 2016-12-03 DIAGNOSIS — I10 Essential (primary) hypertension: Secondary | ICD-10-CM | POA: Diagnosis not present

## 2016-12-03 DIAGNOSIS — J449 Chronic obstructive pulmonary disease, unspecified: Secondary | ICD-10-CM | POA: Diagnosis not present

## 2016-12-03 DIAGNOSIS — C3432 Malignant neoplasm of lower lobe, left bronchus or lung: Secondary | ICD-10-CM

## 2016-12-03 DIAGNOSIS — Z5111 Encounter for antineoplastic chemotherapy: Secondary | ICD-10-CM

## 2016-12-03 DIAGNOSIS — R21 Rash and other nonspecific skin eruption: Secondary | ICD-10-CM

## 2016-12-03 DIAGNOSIS — C3492 Malignant neoplasm of unspecified part of left bronchus or lung: Secondary | ICD-10-CM

## 2016-12-03 DIAGNOSIS — D6481 Anemia due to antineoplastic chemotherapy: Secondary | ICD-10-CM

## 2016-12-03 DIAGNOSIS — T451X5A Adverse effect of antineoplastic and immunosuppressive drugs, initial encounter: Secondary | ICD-10-CM

## 2016-12-03 DIAGNOSIS — E039 Hypothyroidism, unspecified: Secondary | ICD-10-CM

## 2016-12-03 HISTORY — DX: Adverse effect of antineoplastic and immunosuppressive drugs, initial encounter: D64.81

## 2016-12-03 HISTORY — DX: Adverse effect of antineoplastic and immunosuppressive drugs, initial encounter: T45.1X5A

## 2016-12-03 HISTORY — DX: Encounter for antineoplastic chemotherapy: Z51.11

## 2016-12-03 LAB — COMPREHENSIVE METABOLIC PANEL
ALT: 8 U/L (ref 0–55)
AST: 10 U/L (ref 5–34)
Albumin: 3.4 g/dL — ABNORMAL LOW (ref 3.5–5.0)
Alkaline Phosphatase: 62 U/L (ref 40–150)
Anion Gap: 13 mEq/L — ABNORMAL HIGH (ref 3–11)
BUN: 10.2 mg/dL (ref 7.0–26.0)
CALCIUM: 10 mg/dL (ref 8.4–10.4)
CHLORIDE: 102 meq/L (ref 98–109)
CO2: 24 mEq/L (ref 22–29)
CREATININE: 0.8 mg/dL (ref 0.6–1.1)
EGFR: 84 mL/min/{1.73_m2} — ABNORMAL LOW (ref >90)
Glucose: 96 mg/dl (ref 70–140)
Potassium: 3.8 mEq/L (ref 3.5–5.1)
Sodium: 138 mEq/L (ref 136–145)
TOTAL PROTEIN: 7.7 g/dL (ref 6.4–8.3)
Total Bilirubin: 0.56 mg/dL (ref 0.20–1.20)

## 2016-12-03 LAB — CBC WITH DIFFERENTIAL/PLATELET
BASO%: 0.4 % (ref 0.0–2.0)
Basophils Absolute: 0.1 10*3/uL (ref 0.0–0.1)
EOS%: 0.2 % (ref 0.0–7.0)
Eosinophils Absolute: 0 10*3/uL (ref 0.0–0.5)
HEMATOCRIT: 32.1 % — AB (ref 34.8–46.6)
HEMOGLOBIN: 10.8 g/dL — AB (ref 11.6–15.9)
LYMPH#: 1.1 10*3/uL (ref 0.9–3.3)
LYMPH%: 8.8 % — ABNORMAL LOW (ref 14.0–49.7)
MCH: 29.6 pg (ref 25.1–34.0)
MCHC: 33.6 g/dL (ref 31.5–36.0)
MCV: 88.3 fL (ref 79.5–101.0)
MONO#: 1.4 10*3/uL — ABNORMAL HIGH (ref 0.1–0.9)
MONO%: 11.4 % (ref 0.0–14.0)
NEUT%: 79.2 % — ABNORMAL HIGH (ref 38.4–76.8)
NEUTROS ABS: 9.7 10*3/uL — AB (ref 1.5–6.5)
Platelets: 604 10*3/uL — ABNORMAL HIGH (ref 145–400)
RBC: 3.64 10*6/uL — ABNORMAL LOW (ref 3.70–5.45)
RDW: 16.5 % — ABNORMAL HIGH (ref 11.2–14.5)
WBC: 12.2 10*3/uL — AB (ref 3.9–10.3)

## 2016-12-03 LAB — MAGNESIUM: MAGNESIUM: 1.9 mg/dL (ref 1.5–2.5)

## 2016-12-03 MED ORDER — SODIUM CHLORIDE 0.9 % IV SOLN
480.0000 mg | Freq: Once | INTRAVENOUS | Status: AC
  Administered 2016-12-03: 480 mg via INTRAVENOUS
  Filled 2016-12-03: qty 48

## 2016-12-03 MED ORDER — SODIUM CHLORIDE 0.9 % IV SOLN
100.0000 mg/m2 | Freq: Once | INTRAVENOUS | Status: AC
  Administered 2016-12-03: 190 mg via INTRAVENOUS
  Filled 2016-12-03: qty 9.5

## 2016-12-03 MED ORDER — DEXAMETHASONE SODIUM PHOSPHATE 10 MG/ML IJ SOLN
10.0000 mg | Freq: Once | INTRAMUSCULAR | Status: AC
  Administered 2016-12-03: 10 mg via INTRAVENOUS

## 2016-12-03 MED ORDER — SODIUM CHLORIDE 0.9 % IV SOLN
Freq: Once | INTRAVENOUS | Status: AC
  Administered 2016-12-03: 12:00:00 via INTRAVENOUS

## 2016-12-03 MED ORDER — PALONOSETRON HCL INJECTION 0.25 MG/5ML
INTRAVENOUS | Status: AC
  Filled 2016-12-03: qty 5

## 2016-12-03 MED ORDER — PALONOSETRON HCL INJECTION 0.25 MG/5ML
0.2500 mg | Freq: Once | INTRAVENOUS | Status: AC
  Administered 2016-12-03: 0.25 mg via INTRAVENOUS

## 2016-12-03 MED ORDER — DEXAMETHASONE SODIUM PHOSPHATE 10 MG/ML IJ SOLN
INTRAMUSCULAR | Status: AC
  Filled 2016-12-03: qty 1

## 2016-12-03 NOTE — Progress Notes (Signed)
[place] [phone]   [phone]  OFFICE PROGRESS NOTE  Glo Herring., MD [place] [address]  DIAGNOSIS: Limited stage (T1b, N2, M0) small cell lung cancer presented with left lower lobe/infrahilar mass and large mediastinal lymphadenopathy diagnosed in October 2017.  PRIOR THERAPY:Systemic chemotherapy with cisplatin 60 MG/M2 on day 1 and etoposide 120 MG/M2 on days 1, 2 and 3 status post 1 cycle. This was discontinued secondary to intolerance.  CURRENT THERAPY: Systemic chemotherapy with carboplatin for AUC of 4 on day 1 and etoposide 100 MG/M2 on days 1, 2 and 3 with Neulasta support on day 4 every 3 weeks. Status post 1 cycle.  INTERVAL HISTORY: Rebekah Henderson 61 y.o. female returns to the clinic today for follow-up visit accompanied by friend. The patient is currently undergoing systemic chemotherapy with reduced dose carboplatin and etoposide concurrent with radiation. She tolerated her chemotherapy fairly well. She continues to have significant bilateral skin rash in the lower extremities and few areas in the upper extremities. Her skin rash is less severe than what was seen after the first cycle of her chemotherapy. She was treated with steroids with minimal improvement. She denied having any significant weight loss. She has no nausea, vomiting, diarrhea or constipation. She denied having any significant chest pain, shortness of breath, cough or hemoptysis. She is here today for evaluation before starting cycle #3 of her treatment.  MEDICAL HISTORY: Past Medical History:  Diagnosis Date  . Anxiety    takes Prozac daily  . Arthritis   . Arthritis   . Cancer ([place])    skin, lung  . Colon polyps   . COPD (chronic obstructive pulmonary disease) ([place])   . Diverticulitis   . Dyspnea    with exertion  . Fibromyalgia   . GERD (gastroesophageal reflux disease)    takes Pantoprazole daily  . Hypertension    takes  Metoprolol,Triamterene-HCTZ,and Amlodipine daily  . Hypothyroidism    takes Synthroid daily  . IBS (irritable bowel syndrome)   . PONV (postoperative nausea and vomiting)    pt also states that she had some difficulty breathing after cervical fusion    ALLERGIES:  is allergic to penicillins; codeine; ranitidine hcl; keflex [cephalexin]; and lyrica [pregabalin].  MEDICATIONS:  Current Outpatient Prescriptions  Medication Sig Dispense Refill  . albuterol (PROVENTIL) 2 MG tablet Take 2 mg by mouth daily as needed for wheezing or shortness of breath.     Marland Kitchen amLODipine (NORVASC) 5 MG tablet Take 5 mg by mouth daily.      Marland Kitchen aspirin EC 81 MG tablet Take 81 mg by mouth every other day.     . bacitracin ointment Apply topically as needed for wound care (skin biopsy). 120 g 0  . estazolam (PROSOM) 2 MG tablet Take 2 mg by mouth at bedtime.    Marland Kitchen estradiol (ESTRACE) 2 MG tablet Take 2 mg by mouth daily.      Marland Kitchen FLUoxetine (PROZAC) 40 MG capsule Take 40 mg by mouth daily.   2  . HYDROcodone-acetaminophen (NORCO/VICODIN) 5-325 MG tablet Take 1 tablet by mouth every 4 (four) hours as needed for moderate pain.   0  . levothyroxine (SYNTHROID, LEVOTHROID) 25 MCG tablet Take 25 mcg by mouth daily.      Marland Kitchen loperamide (IMODIUM) 2 MG capsule Take 2 mg by mouth every 2 (two) hours as needed for diarrhea or loose stools.    . magnesium oxide (MAG-OX) 400 (241.3  Mg) MG tablet Take 1 tablet (400 mg total) by mouth 3 (three) times daily. 90 tablet 0  . methylPREDNISolone (MEDROL) 4 MG TBPK tablet take per package instructions 21 tablet 0  . metoprolol succinate (TOPROL-XL) 25 MG 24 hr tablet Take 25 mg by mouth daily.    . Multiple Vitamin (MULTIVITAMIN WITH MINERALS) TABS tablet Take 2 tablets by mouth daily.    . ondansetron (ZOFRAN) 8 MG tablet Take 1 tablet (8 mg total) by mouth 3 (three) times daily as needed for nausea or vomiting. 30 tablet 0  . pantoprazole (PROTONIX) 40 MG tablet Take 10 mg by mouth daily.   10  . potassium chloride SA (K-DUR,KLOR-CON) 20 MEQ tablet Take 1 tablet (20 mEq total) by mouth daily. 10 tablet 0  . predniSONE (DELTASONE) 5 MG tablet Take 10 tablets (50 mg total) by mouth daily with breakfast. Taper down prednisone starting from 50 mg a day, taper down by 5 mg a day down to 0 mg. for ex, today 50 mg, tomorrow 45 mg, then 40 mg the following day and etc... 55 tablet 0  . PROAIR HFA 108 (90 BASE) MCG/ACT inhaler Inhale 1-2 puffs into the lungs every 4 (four) hours as needed.   0  . prochlorperazine (COMPAZINE) 10 MG tablet Take 1 tablet (10 mg total) by mouth every 6 (six) hours as needed for nausea or vomiting. (Patient not taking: Reported on 11/11/2016) 30 tablet 0  . triamterene-hydrochlorothiazide (DYAZIDE) 37.5-25 MG per capsule Take 1 capsule by mouth every morning.       No current facility-administered medications for this visit.     SURGICAL HISTORY:  Past Surgical History:  Procedure Laterality Date  . BIOPSY N/A 05/25/2013   Procedure: BIOPSIES (Random Colon; Duodenal; Gastric);  Surgeon: Danie Binder, MD;  Location: [facility];  Service: Endoscopy;  Laterality: N/A;  . BLADDER SUSPENSION    . BREAST ENHANCEMENT SURGERY    . BREAST IMPLANT REMOVAL    . CERVICAL FUSION  AUG 2013  . CHOLECYSTECTOMY  1999  . COLONOSCOPY  2007 [place]   POLYPS  . COLONOSCOPY WITH PROPOFOL N/A 05/25/2013   Procedure: COLONOSCOPY WITH PROPOFOL(at cecum 0957) total withdrawal time=15mn);  Surgeon: SDanie Binder MD;  Location: [facility];  Service: Endoscopy;  Laterality: N/A;  . ESOPHAGOGASTRODUODENOSCOPY (EGD) WITH PROPOFOL N/A 05/25/2013   Procedure: ESOPHAGOGASTRODUODENOSCOPY (EGD) WITH PROPOFOL;  Surgeon: SDanie Binder MD;  Location: [facility];  Service: Endoscopy;  Laterality: N/A;  . FOOT SURGERY    . POLYPECTOMY N/A 05/25/2013   Procedure: POLYPECTOMY (Rectal and Gastric);  Surgeon: SDanie Binder MD;  Location: [facility];  Service: Endoscopy;  Laterality: N/A;  . TONSILLECTOMY      . UPPER GASTROINTESTINAL ENDOSCOPY    . VIDEO BRONCHOSCOPY WITH ENDOBRONCHIAL ULTRASOUND  09/12/2016   Procedure: VIDEO BRONCHOSCOPY WITH ENDOBRONCHIAL ULTRASOUND AND BIOPSY;  Surgeon: DJuanito Doom MD;  Location: [facility];  Service: Cardiopulmonary;;    REVIEW OF SYSTEMS:  Constitutional: positive for fatigue Eyes: negative Ears, nose, mouth, throat, and face: negative Respiratory: negative Cardiovascular: negative Gastrointestinal: negative Genitourinary:negative Integument/breast: positive for rash Hematologic/lymphatic: negative Musculoskeletal:negative Neurological: negative Behavioral/Psych: negative Endocrine: negative Allergic/Immunologic: negative   PHYSICAL EXAMINATION: General appearance: alert, cooperative, fatigued and no distress Head: Normocephalic, without obvious abnormality, atraumatic Neck: no adenopathy, no JVD, supple, symmetrical, trachea midline and thyroid not enlarged, symmetric, no tenderness/mass/nodules Lymph nodes: Cervical, supraclavicular, and axillary nodes normal. Resp: clear to auscultation bilaterally Back: symmetric, no curvature. ROM normal.  No CVA tenderness. Cardio: regular rate and rhythm, S1, S2 normal, no murmur, click, rub or gallop GI: soft, non-tender; bowel sounds normal; no masses,  no organomegaly Extremities: Significant erythema and the skin rash bilaterally. Neurologic: Alert and oriented X 3, normal strength and tone. Normal symmetric reflexes. Normal coordination and gait      ECOG PERFORMANCE STATUS: 1 - Symptomatic but completely ambulatory  Blood pressure (!) 123/100, pulse (!) 107, temperature 98.4 F (36.9 C), temperature source Oral, resp. rate 18, weight 177 lb (80.3 kg), SpO2 98 %.  LABORATORY DATA: Lab Results  Component Value Date   WBC 12.2 (H) 12/03/2016   HGB 10.8 (L) 12/03/2016   HCT 32.1 (L) 12/03/2016   MCV 88.3 12/03/2016   PLT 604 (H) 12/03/2016      Chemistry      Component Value Date/Time    NA 138 12/03/2016 1028   K 3.8 12/03/2016 1028   CL 99 (L) 11/26/2016 1115   CO2 24 12/03/2016 1028   BUN 10.2 12/03/2016 1028   CREATININE 0.8 12/03/2016 1028      Component Value Date/Time   CALCIUM 10.0 12/03/2016 1028   ALKPHOS 62 12/03/2016 1028   AST 10 12/03/2016 1028   ALT 8 12/03/2016 1028   BILITOT 0.56 12/03/2016 1028       RADIOGRAPHIC STUDIES: No results found.  ASSESSMENT AND PLAN: This is a very pleasant 61 years old white female with: 1) Limited stage small cell lung cancer currently undergoing systemic chemotherapy with carboplatin and etoposide status post 1 cycle of this regimen. She was previously treated with 1 cycle of systemic chemotherapy with cisplatin and etoposide discontinued secondary to intolerance. I recommended for the patient to proceed with cycle #2 of the chemotherapy with carboplatin and etoposide as a scheduled today. She will also continue with the concurrent radiotherapy which is expected to be completed in 2 weeks. I will see the patient back for follow-up visit in 3 weeks for reevaluation with repeat CT scan of the chest for restaging of her disease. 2) bilateral skin rash: Unclear etiology could be secondary to systemic chemotherapy. I will refer the patient to dermatology for evaluation and consideration of biopsy for identification of the etiology. 3) chemotherapy-induced anemia: Will continue to monitor for now and consider the patient for transfusion if needed. 4) COPD: She will continue on ProAir and albuterol. 5) hypertension: The patient will continue her current treatment with Toprol-XL, Dyazide and Norvasc. The patient voices understanding of current disease status and treatment options and is in agreement with the current care plan.  All questions were answered. The patient knows to call the clinic with any problems, questions or concerns. We can certainly see the patient much sooner if necessary.  Disclaimer: This note was  dictated with voice recognition software. Similar sounding words can inadvertently be transcribed and may not be corrected upon review.

## 2016-12-03 NOTE — Patient Instructions (Signed)
[facility] Discharge Instructions for Patients Receiving Chemotherapy  Today you received the following chemotherapy agents: Etoposide and Carboplatin   To help prevent nausea and vomiting after your treatment, we encourage you to take your nausea medication as directed.    If you develop nausea and vomiting that is not controlled by your nausea medication, call the clinic.   BELOW ARE SYMPTOMS THAT SHOULD BE REPORTED IMMEDIATELY:  *FEVER GREATER THAN 100.5 F  *CHILLS WITH OR WITHOUT FEVER  NAUSEA AND VOMITING THAT IS NOT CONTROLLED WITH YOUR NAUSEA MEDICATION  *UNUSUAL SHORTNESS OF BREATH  *UNUSUAL BRUISING OR BLEEDING  TENDERNESS IN MOUTH AND THROAT WITH OR WITHOUT PRESENCE OF ULCERS  *URINARY PROBLEMS  *BOWEL PROBLEMS  UNUSUAL RASH Items with * indicate a potential emergency and should be followed up as soon as possible.  Feel free to call the clinic you have any questions or concerns. The clinic phone number is [phone].  Please show the CHEMO ALERT CARD at check-in to the Emergency Department and triage nurse.  

## 2016-12-03 NOTE — Telephone Encounter (Signed)
Gave patient avs report and appointments for January and February. Central radiology will call re scan at [facility].   Patient will continue weekly standing lab at [facility] -patient aware.  Spoke with [facility]  [place], [place] [address] [phone]  Dr. Denna Haggard  12/10/2016 @ 11:45 am  Arrive 10:45 am  Patient aware.

## 2016-12-03 NOTE — Progress Notes (Signed)
12/03/2016 counseling intern met with patient during treatment. Patient was alert, oriented, and in good spirits. She stated that she had felt stressed earlier in the day due to pains in her legs and feet and coming in for  treatment. By the time of this session, though, she noted feeling calmer and reported that she'd discussed her leg and feet pains with her doctor and felt hopeful she would get relief soon. Patient expressed feeling anxious upon learning that she will have treatment for a longer length of time than had originally been planned. Near the beginning of this session, she noted that she had not had time to let this news "sink in." By the end of the session, she expressed feeling hopeful that her CT scan would be indicate a shorter round of treatment. Patient described her well-being during the holidays and noted that despite feeling sick on Christmas Day, she was happy that she was able to spend some time with her son and grandson. She also learned that her dog has recovered from cancer.  Patient and counselor reflected on patient's support network, as one of patient's friends sat with her during the session. Patient expressed some concerns about asking people to bring her in for treatment, especially after learning that she will be in treatment longer. Counselor and friend reminded patient of her support of others and encouraged her to be willing to receive help and support from others. Counselor plans to check in with patient during treatment on 12/04/2016 to assess her anxiety level and to offer continued emotional support.  Lamount Cohen, Counseling Intern Department for Spiritual Care and [facility] Supervisor - Scientist, research (physical sciences)

## 2016-12-04 ENCOUNTER — Ambulatory Visit (HOSPITAL_BASED_OUTPATIENT_CLINIC_OR_DEPARTMENT_OTHER): Payer: Medicare Other

## 2016-12-04 VITALS — BP 120/79 | HR 78 | Temp 97.9°F | Resp 18

## 2016-12-04 DIAGNOSIS — C3432 Malignant neoplasm of lower lobe, left bronchus or lung: Secondary | ICD-10-CM

## 2016-12-04 DIAGNOSIS — F419 Anxiety disorder, unspecified: Secondary | ICD-10-CM | POA: Diagnosis not present

## 2016-12-04 DIAGNOSIS — Z5111 Encounter for antineoplastic chemotherapy: Secondary | ICD-10-CM | POA: Diagnosis not present

## 2016-12-04 DIAGNOSIS — C3492 Malignant neoplasm of unspecified part of left bronchus or lung: Secondary | ICD-10-CM

## 2016-12-04 DIAGNOSIS — Z510 Encounter for antineoplastic radiation therapy: Secondary | ICD-10-CM | POA: Diagnosis not present

## 2016-12-04 DIAGNOSIS — Z79899 Other long term (current) drug therapy: Secondary | ICD-10-CM | POA: Diagnosis not present

## 2016-12-04 DIAGNOSIS — E039 Hypothyroidism, unspecified: Secondary | ICD-10-CM | POA: Diagnosis not present

## 2016-12-04 DIAGNOSIS — I10 Essential (primary) hypertension: Secondary | ICD-10-CM | POA: Diagnosis not present

## 2016-12-04 DIAGNOSIS — J449 Chronic obstructive pulmonary disease, unspecified: Secondary | ICD-10-CM | POA: Diagnosis not present

## 2016-12-04 MED ORDER — ETOPOSIDE CHEMO INJECTION 1 GM/50ML
100.0000 mg/m2 | Freq: Once | INTRAVENOUS | Status: AC
  Administered 2016-12-04: 190 mg via INTRAVENOUS
  Filled 2016-12-04: qty 9.5

## 2016-12-04 MED ORDER — DEXAMETHASONE SODIUM PHOSPHATE 10 MG/ML IJ SOLN
INTRAMUSCULAR | Status: AC
  Filled 2016-12-04: qty 1

## 2016-12-04 MED ORDER — SODIUM CHLORIDE 0.9 % IV SOLN
Freq: Once | INTRAVENOUS | Status: AC
  Administered 2016-12-04: 10:00:00 via INTRAVENOUS

## 2016-12-04 MED ORDER — PROCHLORPERAZINE MALEATE 10 MG PO TABS
ORAL_TABLET | ORAL | Status: AC
  Filled 2016-12-04: qty 1

## 2016-12-04 MED ORDER — PROCHLORPERAZINE MALEATE 10 MG PO TABS
10.0000 mg | ORAL_TABLET | Freq: Once | ORAL | Status: AC
  Administered 2016-12-04: 10 mg via ORAL

## 2016-12-04 MED ORDER — DEXAMETHASONE SODIUM PHOSPHATE 10 MG/ML IJ SOLN
10.0000 mg | Freq: Once | INTRAMUSCULAR | Status: AC
  Administered 2016-12-04: 10 mg via INTRAVENOUS

## 2016-12-04 NOTE — Patient Instructions (Signed)
[facility] Discharge Instructions for Patients Receiving Chemotherapy  Today you received the following chemotherapy agents: Etoposide   To help prevent nausea and vomiting after your treatment, we encourage you to take your nausea medication as directed.    If you develop nausea and vomiting that is not controlled by your nausea medication, call the clinic.   BELOW ARE SYMPTOMS THAT SHOULD BE REPORTED IMMEDIATELY:  *FEVER GREATER THAN 100.5 F  *CHILLS WITH OR WITHOUT FEVER  NAUSEA AND VOMITING THAT IS NOT CONTROLLED WITH YOUR NAUSEA MEDICATION  *UNUSUAL SHORTNESS OF BREATH  *UNUSUAL BRUISING OR BLEEDING  TENDERNESS IN MOUTH AND THROAT WITH OR WITHOUT PRESENCE OF ULCERS  *URINARY PROBLEMS  *BOWEL PROBLEMS  UNUSUAL RASH Items with * indicate a potential emergency and should be followed up as soon as possible.  Feel free to call the clinic you have any questions or concerns. The clinic phone number is [phone].  Please show the CHEMO ALERT CARD at check-in to the Emergency Department and triage nurse.   

## 2016-12-04 NOTE — Progress Notes (Signed)
12/04/2016 Counselor met with patient during treatment. Patient engaged clearly with counselor throughout session but indicated she felt tired. She stated that she had been nauseous the night before and had not been able to sleep due to worrying about today's appointments. Patient appeared less cheerful than yesterday. She had received negative feedback about missing two appointments and indicated that she feels overwhelmed by the number of appointments scheduled for her at this time. She again indicated feeling frustrated that the number of treatments has increased, and she stated that she is looking forward to the day when all her treatments are finished. Counselor talked to patient about the benefits of taking things one day at a time and looking for the things over which she does have control, including advocating for herself in terms of when she can make appointments.   Lamount Cohen, Counseling Intern Department for Spiritual Care and [facility] Supervisor - Scientist, research (physical sciences)

## 2016-12-05 ENCOUNTER — Ambulatory Visit (HOSPITAL_BASED_OUTPATIENT_CLINIC_OR_DEPARTMENT_OTHER): Payer: Medicare Other

## 2016-12-05 DIAGNOSIS — C3432 Malignant neoplasm of lower lobe, left bronchus or lung: Secondary | ICD-10-CM | POA: Diagnosis not present

## 2016-12-05 DIAGNOSIS — Z5111 Encounter for antineoplastic chemotherapy: Secondary | ICD-10-CM

## 2016-12-05 DIAGNOSIS — C3492 Malignant neoplasm of unspecified part of left bronchus or lung: Secondary | ICD-10-CM

## 2016-12-05 MED ORDER — DEXAMETHASONE SODIUM PHOSPHATE 10 MG/ML IJ SOLN
10.0000 mg | Freq: Once | INTRAMUSCULAR | Status: AC
  Administered 2016-12-05: 10 mg via INTRAVENOUS

## 2016-12-05 MED ORDER — SODIUM CHLORIDE 0.9 % IV SOLN
Freq: Once | INTRAVENOUS | Status: AC
  Administered 2016-12-05: 11:00:00 via INTRAVENOUS

## 2016-12-05 MED ORDER — SODIUM CHLORIDE 0.9 % IV SOLN
100.0000 mg/m2 | Freq: Once | INTRAVENOUS | Status: AC
  Administered 2016-12-05: 190 mg via INTRAVENOUS
  Filled 2016-12-05: qty 9.5

## 2016-12-05 MED ORDER — DEXAMETHASONE SODIUM PHOSPHATE 10 MG/ML IJ SOLN
INTRAMUSCULAR | Status: AC
  Filled 2016-12-05: qty 1

## 2016-12-05 NOTE — Patient Instructions (Signed)
[facility] Discharge Instructions for Patients Receiving Chemotherapy  Today you received the following chemotherapy agents: Etoposide   To help prevent nausea and vomiting after your treatment, we encourage you to take your nausea medication as directed.    If you develop nausea and vomiting that is not controlled by your nausea medication, call the clinic.   BELOW ARE SYMPTOMS THAT SHOULD BE REPORTED IMMEDIATELY:  *FEVER GREATER THAN 100.5 F  *CHILLS WITH OR WITHOUT FEVER  NAUSEA AND VOMITING THAT IS NOT CONTROLLED WITH YOUR NAUSEA MEDICATION  *UNUSUAL SHORTNESS OF BREATH  *UNUSUAL BRUISING OR BLEEDING  TENDERNESS IN MOUTH AND THROAT WITH OR WITHOUT PRESENCE OF ULCERS  *URINARY PROBLEMS  *BOWEL PROBLEMS  UNUSUAL RASH Items with * indicate a potential emergency and should be followed up as soon as possible.  Feel free to call the clinic you have any questions or concerns. The clinic phone number is [phone].  Please show the CHEMO ALERT CARD at check-in to the Emergency Department and triage nurse.   

## 2016-12-07 ENCOUNTER — Inpatient Hospital Stay (HOSPITAL_COMMUNITY)
Admission: EM | Admit: 2016-12-07 | Discharge: 2016-12-11 | DRG: 193 | Disposition: A | Discharge status: Home / Self Care | Payer: Medicare Other | Attending: Family Medicine | Admitting: Family Medicine

## 2016-12-07 ENCOUNTER — Emergency Department (HOSPITAL_COMMUNITY): Payer: Medicare Other

## 2016-12-07 ENCOUNTER — Encounter (HOSPITAL_COMMUNITY): Payer: Self-pay

## 2016-12-07 ENCOUNTER — Ambulatory Visit: Payer: Medicare Other

## 2016-12-07 DIAGNOSIS — M797 Fibromyalgia: Secondary | ICD-10-CM | POA: Diagnosis present

## 2016-12-07 DIAGNOSIS — R197 Diarrhea, unspecified: Secondary | ICD-10-CM | POA: Diagnosis present

## 2016-12-07 DIAGNOSIS — Z803 Family history of malignant neoplasm of breast: Secondary | ICD-10-CM

## 2016-12-07 DIAGNOSIS — Z8249 Family history of ischemic heart disease and other diseases of the circulatory system: Secondary | ICD-10-CM | POA: Diagnosis not present

## 2016-12-07 DIAGNOSIS — I10 Essential (primary) hypertension: Secondary | ICD-10-CM | POA: Diagnosis present

## 2016-12-07 DIAGNOSIS — Z9221 Personal history of antineoplastic chemotherapy: Secondary | ICD-10-CM | POA: Diagnosis not present

## 2016-12-07 DIAGNOSIS — Z86010 Personal history of colonic polyps: Secondary | ICD-10-CM | POA: Diagnosis not present

## 2016-12-07 DIAGNOSIS — D701 Agranulocytosis secondary to cancer chemotherapy: Secondary | ICD-10-CM | POA: Diagnosis present

## 2016-12-07 DIAGNOSIS — Z79899 Other long term (current) drug therapy: Secondary | ICD-10-CM | POA: Diagnosis not present

## 2016-12-07 DIAGNOSIS — Z801 Family history of malignant neoplasm of trachea, bronchus and lung: Secondary | ICD-10-CM

## 2016-12-07 DIAGNOSIS — Z833 Family history of diabetes mellitus: Secondary | ICD-10-CM | POA: Diagnosis not present

## 2016-12-07 DIAGNOSIS — R1111 Vomiting without nausea: Secondary | ICD-10-CM | POA: Diagnosis not present

## 2016-12-07 DIAGNOSIS — J449 Chronic obstructive pulmonary disease, unspecified: Secondary | ICD-10-CM | POA: Diagnosis not present

## 2016-12-07 DIAGNOSIS — C3490 Malignant neoplasm of unspecified part of unspecified bronchus or lung: Secondary | ICD-10-CM | POA: Diagnosis not present

## 2016-12-07 DIAGNOSIS — R112 Nausea with vomiting, unspecified: Secondary | ICD-10-CM | POA: Diagnosis not present

## 2016-12-07 DIAGNOSIS — E039 Hypothyroidism, unspecified: Secondary | ICD-10-CM | POA: Diagnosis not present

## 2016-12-07 DIAGNOSIS — Z9981 Dependence on supplemental oxygen: Secondary | ICD-10-CM

## 2016-12-07 DIAGNOSIS — Z87891 Personal history of nicotine dependence: Secondary | ICD-10-CM

## 2016-12-07 DIAGNOSIS — R0602 Shortness of breath: Secondary | ICD-10-CM | POA: Diagnosis not present

## 2016-12-07 DIAGNOSIS — D6481 Anemia due to antineoplastic chemotherapy: Secondary | ICD-10-CM | POA: Diagnosis present

## 2016-12-07 DIAGNOSIS — Z923 Personal history of irradiation: Secondary | ICD-10-CM | POA: Diagnosis not present

## 2016-12-07 DIAGNOSIS — J9621 Acute and chronic respiratory failure with hypoxia: Secondary | ICD-10-CM | POA: Diagnosis present

## 2016-12-07 DIAGNOSIS — Z7401 Bed confinement status: Secondary | ICD-10-CM | POA: Diagnosis not present

## 2016-12-07 DIAGNOSIS — E872 Acidosis: Secondary | ICD-10-CM | POA: Diagnosis not present

## 2016-12-07 DIAGNOSIS — J09X2 Influenza due to identified novel influenza A virus with other respiratory manifestations: Secondary | ICD-10-CM | POA: Diagnosis not present

## 2016-12-07 DIAGNOSIS — R6889 Other general symptoms and signs: Secondary | ICD-10-CM

## 2016-12-07 DIAGNOSIS — J101 Influenza due to other identified influenza virus with other respiratory manifestations: Secondary | ICD-10-CM

## 2016-12-07 DIAGNOSIS — Z7982 Long term (current) use of aspirin: Secondary | ICD-10-CM

## 2016-12-07 DIAGNOSIS — J40 Bronchitis, not specified as acute or chronic: Secondary | ICD-10-CM | POA: Diagnosis present

## 2016-12-07 DIAGNOSIS — F419 Anxiety disorder, unspecified: Secondary | ICD-10-CM | POA: Diagnosis present

## 2016-12-07 DIAGNOSIS — R6510 Systemic inflammatory response syndrome (SIRS) of non-infectious origin without acute organ dysfunction: Secondary | ICD-10-CM | POA: Diagnosis not present

## 2016-12-07 DIAGNOSIS — R061 Stridor: Secondary | ICD-10-CM | POA: Diagnosis not present

## 2016-12-07 DIAGNOSIS — K219 Gastro-esophageal reflux disease without esophagitis: Secondary | ICD-10-CM | POA: Diagnosis present

## 2016-12-07 DIAGNOSIS — T451X5A Adverse effect of antineoplastic and immunosuppressive drugs, initial encounter: Secondary | ICD-10-CM | POA: Diagnosis present

## 2016-12-07 DIAGNOSIS — D61810 Antineoplastic chemotherapy induced pancytopenia: Secondary | ICD-10-CM | POA: Diagnosis not present

## 2016-12-07 DIAGNOSIS — Z7951 Long term (current) use of inhaled steroids: Secondary | ICD-10-CM

## 2016-12-07 DIAGNOSIS — R404 Transient alteration of awareness: Secondary | ICD-10-CM | POA: Diagnosis not present

## 2016-12-07 DIAGNOSIS — R0902 Hypoxemia: Secondary | ICD-10-CM | POA: Diagnosis not present

## 2016-12-07 DIAGNOSIS — R509 Fever, unspecified: Secondary | ICD-10-CM | POA: Diagnosis not present

## 2016-12-07 DIAGNOSIS — R279 Unspecified lack of coordination: Secondary | ICD-10-CM | POA: Diagnosis not present

## 2016-12-07 DIAGNOSIS — E876 Hypokalemia: Secondary | ICD-10-CM | POA: Diagnosis present

## 2016-12-07 DIAGNOSIS — C3492 Malignant neoplasm of unspecified part of left bronchus or lung: Secondary | ICD-10-CM | POA: Diagnosis present

## 2016-12-07 DIAGNOSIS — J111 Influenza due to unidentified influenza virus with other respiratory manifestations: Secondary | ICD-10-CM | POA: Diagnosis not present

## 2016-12-07 DIAGNOSIS — A084 Viral intestinal infection, unspecified: Secondary | ICD-10-CM | POA: Diagnosis present

## 2016-12-07 DIAGNOSIS — R531 Weakness: Secondary | ICD-10-CM | POA: Diagnosis not present

## 2016-12-07 DIAGNOSIS — I482 Chronic atrial fibrillation: Secondary | ICD-10-CM | POA: Diagnosis not present

## 2016-12-07 LAB — COMPREHENSIVE METABOLIC PANEL
ALBUMIN: 3.4 g/dL — AB (ref 3.5–5.0)
ALT: 16 U/L (ref 14–54)
ANION GAP: 10 (ref 5–15)
AST: 27 U/L (ref 15–41)
Alkaline Phosphatase: 43 U/L (ref 38–126)
BILIRUBIN TOTAL: 0.8 mg/dL (ref 0.3–1.2)
BUN: 13 mg/dL (ref 6–20)
CO2: 27 mmol/L (ref 22–32)
Calcium: 8.8 mg/dL — ABNORMAL LOW (ref 8.9–10.3)
Chloride: 97 mmol/L — ABNORMAL LOW (ref 101–111)
Creatinine, Ser: 0.76 mg/dL (ref 0.44–1.00)
Glucose, Bld: 106 mg/dL — ABNORMAL HIGH (ref 65–99)
POTASSIUM: 3.7 mmol/L (ref 3.5–5.1)
Sodium: 134 mmol/L — ABNORMAL LOW (ref 135–145)
TOTAL PROTEIN: 6.9 g/dL (ref 6.5–8.1)

## 2016-12-07 LAB — LACTIC ACID, PLASMA
LACTIC ACID, VENOUS: 1.7 mmol/L (ref 0.5–1.9)
LACTIC ACID, VENOUS: 2.3 mmol/L — AB (ref 0.5–1.9)

## 2016-12-07 LAB — CBC WITH DIFFERENTIAL/PLATELET
BASOS ABS: 0 10*3/uL (ref 0.0–0.1)
BASOS PCT: 0 %
Eosinophils Absolute: 0 10*3/uL (ref 0.0–0.7)
Eosinophils Relative: 0 %
HEMATOCRIT: 28.6 % — AB (ref 36.0–46.0)
HEMOGLOBIN: 9.6 g/dL — AB (ref 12.0–15.0)
Lymphocytes Relative: 6 %
Lymphs Abs: 0.3 10*3/uL — ABNORMAL LOW (ref 0.7–4.0)
MCH: 30.3 pg (ref 26.0–34.0)
MCHC: 33.6 g/dL (ref 30.0–36.0)
MCV: 90.2 fL (ref 78.0–100.0)
Monocytes Absolute: 0.1 10*3/uL (ref 0.1–1.0)
Monocytes Relative: 2 %
NEUTROS ABS: 4.9 10*3/uL (ref 1.7–7.7)
NEUTROS PCT: 92 %
Platelets: 478 10*3/uL — ABNORMAL HIGH (ref 150–400)
RBC: 3.17 MIL/uL — ABNORMAL LOW (ref 3.87–5.11)
RDW: 16.5 % — AB (ref 11.5–15.5)
WBC: 5.3 10*3/uL (ref 4.0–10.5)

## 2016-12-07 LAB — URINALYSIS, ROUTINE W REFLEX MICROSCOPIC
Bilirubin Urine: NEGATIVE
Glucose, UA: NEGATIVE mg/dL
HGB URINE DIPSTICK: NEGATIVE
KETONES UR: NEGATIVE mg/dL
LEUKOCYTES UA: NEGATIVE
Nitrite: NEGATIVE
PROTEIN: NEGATIVE mg/dL
Specific Gravity, Urine: 1.016 (ref 1.005–1.030)
pH: 6 (ref 5.0–8.0)

## 2016-12-07 LAB — LIPASE, BLOOD: LIPASE: 22 U/L (ref 11–51)

## 2016-12-07 LAB — INFLUENZA PANEL BY PCR (TYPE A & B)
Influenza A By PCR: POSITIVE — AB
Influenza B By PCR: NEGATIVE

## 2016-12-07 LAB — PROTIME-INR
INR: 0.98
PROTHROMBIN TIME: 13 s (ref 11.4–15.2)

## 2016-12-07 MED ORDER — VANCOMYCIN HCL 10 G IV SOLR
1500.0000 mg | Freq: Once | INTRAVENOUS | Status: AC
  Administered 2016-12-07: 1500 mg via INTRAVENOUS
  Filled 2016-12-07: qty 1500

## 2016-12-07 MED ORDER — ONDANSETRON HCL 4 MG/2ML IJ SOLN
4.0000 mg | Freq: Once | INTRAMUSCULAR | Status: AC
  Administered 2016-12-07: 4 mg via INTRAVENOUS

## 2016-12-07 MED ORDER — ASPIRIN EC 81 MG PO TBEC
81.0000 mg | DELAYED_RELEASE_TABLET | ORAL | Status: DC
Start: 2016-12-07 — End: 2016-12-11
  Administered 2016-12-09 – 2016-12-11 (×2): 81 mg via ORAL
  Filled 2016-12-07 (×2): qty 1

## 2016-12-07 MED ORDER — METOPROLOL SUCCINATE ER 25 MG PO TB24
25.0000 mg | ORAL_TABLET | Freq: Every day | ORAL | Status: DC
  Administered 2016-12-08 – 2016-12-11 (×4): 25 mg via ORAL
  Filled 2016-12-07 (×5): qty 1

## 2016-12-07 MED ORDER — HYDROMORPHONE HCL 1 MG/ML IJ SOLN: 1.0000 mg | Freq: Once | INTRAMUSCULAR | Status: DC

## 2016-12-07 MED ORDER — TRIAMTERENE-HCTZ 37.5-25 MG PO CAPS
1.0000 | ORAL_CAPSULE | Freq: Every day | ORAL | Status: DC
  Filled 2016-12-07: qty 1

## 2016-12-07 MED ORDER — ONDANSETRON HCL 4 MG/2ML IJ SOLN
INTRAMUSCULAR | Status: AC
  Filled 2016-12-07: qty 2

## 2016-12-07 MED ORDER — LOPERAMIDE HCL 2 MG PO CAPS: 4.0000 mg | ORAL_CAPSULE | ORAL | Status: DC | PRN

## 2016-12-07 MED ORDER — ENOXAPARIN SODIUM 40 MG/0.4ML ~~LOC~~ SOLN
40.0000 mg | SUBCUTANEOUS | Status: DC
  Administered 2016-12-08 – 2016-12-10 (×4): 40 mg via SUBCUTANEOUS
  Filled 2016-12-07 (×4): qty 0.4

## 2016-12-07 MED ORDER — HYDROMORPHONE HCL 1 MG/ML IJ SOLN
INTRAMUSCULAR | Status: AC
  Administered 2016-12-07: 0.5 mg via INTRAVENOUS
  Filled 2016-12-07: qty 1

## 2016-12-07 MED ORDER — SODIUM CHLORIDE 0.9 % IV SOLN
INTRAVENOUS | Status: DC
  Administered 2016-12-07: 1000 mL via INTRAVENOUS
  Administered 2016-12-07: 14:00:00 via INTRAVENOUS
  Administered 2016-12-08: 1000 mL via INTRAVENOUS
  Administered 2016-12-08: 10:00:00 via INTRAVENOUS
  Administered 2016-12-09: 1000 mL via INTRAVENOUS
  Administered 2016-12-10 (×2): via INTRAVENOUS
  Administered 2016-12-11: 1000 mL via INTRAVENOUS

## 2016-12-07 MED ORDER — HYDROMORPHONE HCL 1 MG/ML IJ SOLN
0.5000 mg | Freq: Once | INTRAMUSCULAR | Status: AC
Start: 2016-12-07 — End: 2016-12-07
  Administered 2016-12-07: 0.5 mg via INTRAVENOUS

## 2016-12-07 MED ORDER — SODIUM CHLORIDE 0.9 % IV SOLN
1.0000 g | Freq: Three times a day (TID) | INTRAVENOUS | Status: DC
  Administered 2016-12-07: 1 g via INTRAVENOUS
  Filled 2016-12-07 (×3): qty 1

## 2016-12-07 MED ORDER — VANCOMYCIN HCL 10 G IV SOLR
INTRAVENOUS | Status: AC
  Filled 2016-12-07: qty 1500

## 2016-12-07 MED ORDER — AMLODIPINE BESYLATE 5 MG PO TABS
5.0000 mg | ORAL_TABLET | Freq: Every day | ORAL | Status: DC
  Administered 2016-12-08 – 2016-12-11 (×4): 5 mg via ORAL
  Filled 2016-12-07 (×5): qty 1

## 2016-12-07 MED ORDER — PANTOPRAZOLE SODIUM 20 MG PO TBEC
20.0000 mg | DELAYED_RELEASE_TABLET | Freq: Every day | ORAL | Status: DC
  Administered 2016-12-08 – 2016-12-11 (×4): 20 mg via ORAL
  Filled 2016-12-07 (×5): qty 1

## 2016-12-07 MED ORDER — HYDROMORPHONE HCL 1 MG/ML IJ SOLN
1.0000 mg | Freq: Once | INTRAMUSCULAR | Status: AC
  Administered 2016-12-07: 1 mg via INTRAVENOUS
  Filled 2016-12-07: qty 1

## 2016-12-07 MED ORDER — ESTRADIOL 2 MG PO TABS
2.0000 mg | ORAL_TABLET | Freq: Every day | ORAL | Status: DC
  Administered 2016-12-08 – 2016-12-11 (×4): 2 mg via ORAL
  Filled 2016-12-07 (×5): qty 1

## 2016-12-07 MED ORDER — ACETAMINOPHEN 650 MG RE SUPP: 650.0000 mg | Freq: Four times a day (QID) | RECTAL | Status: DC | PRN

## 2016-12-07 MED ORDER — SODIUM CHLORIDE 0.9 % IV BOLUS (SEPSIS)
500.0000 mL | Freq: Once | INTRAVENOUS | Status: AC
  Administered 2016-12-07: 500 mL via INTRAVENOUS

## 2016-12-07 MED ORDER — SODIUM CHLORIDE 0.9 % IV BOLUS (SEPSIS)
1000.0000 mL | Freq: Once | INTRAVENOUS | Status: AC
  Administered 2016-12-07: 1000 mL via INTRAVENOUS

## 2016-12-07 MED ORDER — GUAIFENESIN ER 600 MG PO TB12
600.0000 mg | ORAL_TABLET | Freq: Two times a day (BID) | ORAL | Status: DC
  Administered 2016-12-08 – 2016-12-11 (×7): 600 mg via ORAL
  Filled 2016-12-07 (×8): qty 1

## 2016-12-07 MED ORDER — ALBUTEROL SULFATE 2 MG PO TABS
2.0000 mg | ORAL_TABLET | Freq: Every day | ORAL | Status: DC | PRN
  Filled 2016-12-07: qty 1

## 2016-12-07 MED ORDER — ONDANSETRON HCL 4 MG/2ML IJ SOLN
4.0000 mg | Freq: Once | INTRAMUSCULAR | Status: AC
  Administered 2016-12-07: 4 mg via INTRAVENOUS
  Filled 2016-12-07: qty 2

## 2016-12-07 MED ORDER — TEMAZEPAM 15 MG PO CAPS
30.0000 mg | ORAL_CAPSULE | Freq: Every day | ORAL | Status: DC
  Administered 2016-12-08 – 2016-12-10 (×3): 30 mg via ORAL
  Filled 2016-12-07 (×4): qty 2

## 2016-12-07 MED ORDER — ALBUTEROL SULFATE (2.5 MG/3ML) 0.083% IN NEBU: 2.5000 mg | INHALATION_SOLUTION | RESPIRATORY_TRACT | Status: DC | PRN

## 2016-12-07 MED ORDER — ACETAMINOPHEN 325 MG PO TABS: 650.0000 mg | ORAL_TABLET | Freq: Four times a day (QID) | ORAL | Status: DC | PRN

## 2016-12-07 MED ORDER — MAGNESIUM OXIDE 400 (241.3 MG) MG PO TABS
400.0000 mg | ORAL_TABLET | Freq: Three times a day (TID) | ORAL | Status: DC
  Administered 2016-12-08 – 2016-12-11 (×10): 400 mg via ORAL
  Filled 2016-12-07 (×11): qty 1

## 2016-12-07 MED ORDER — HYDROCODONE-ACETAMINOPHEN 5-325 MG PO TABS
1.0000 | ORAL_TABLET | ORAL | Status: DC | PRN
  Administered 2016-12-08 – 2016-12-09 (×6): 1 via ORAL
  Filled 2016-12-07 (×6): qty 1

## 2016-12-07 MED ORDER — SODIUM CHLORIDE 0.9 % IV SOLN
INTRAVENOUS | Status: AC
  Administered 2016-12-08: 02:00:00 via INTRAVENOUS

## 2016-12-07 MED ORDER — IPRATROPIUM-ALBUTEROL 0.5-2.5 (3) MG/3ML IN SOLN
3.0000 mL | Freq: Four times a day (QID) | RESPIRATORY_TRACT | Status: DC
  Administered 2016-12-07 – 2016-12-08 (×2): 3 mL via RESPIRATORY_TRACT
  Filled 2016-12-07 (×2): qty 3

## 2016-12-07 MED ORDER — LEVOTHYROXINE SODIUM 25 MCG PO TABS
25.0000 ug | ORAL_TABLET | Freq: Every day | ORAL | Status: DC
  Administered 2016-12-08 – 2016-12-11 (×4): 25 ug via ORAL
  Filled 2016-12-07 (×4): qty 1

## 2016-12-07 MED ORDER — ONDANSETRON HCL 4 MG/2ML IJ SOLN
4.0000 mg | Freq: Four times a day (QID) | INTRAMUSCULAR | Status: DC | PRN
  Administered 2016-12-07 (×2): 4 mg via INTRAVENOUS
  Filled 2016-12-07 (×2): qty 2

## 2016-12-07 MED ORDER — FLUOXETINE HCL 20 MG PO CAPS
40.0000 mg | ORAL_CAPSULE | Freq: Every day | ORAL | Status: DC
  Administered 2016-12-08 – 2016-12-11 (×4): 40 mg via ORAL
  Filled 2016-12-07 (×4): qty 2

## 2016-12-07 MED ORDER — OSELTAMIVIR PHOSPHATE 75 MG PO CAPS
75.0000 mg | ORAL_CAPSULE | Freq: Two times a day (BID) | ORAL | Status: DC
  Administered 2016-12-07 – 2016-12-11 (×8): 75 mg via ORAL
  Filled 2016-12-07 (×8): qty 1

## 2016-12-07 MED ORDER — ONDANSETRON HCL 4 MG PO TABS: 4.0000 mg | ORAL_TABLET | Freq: Four times a day (QID) | ORAL | Status: DC | PRN

## 2016-12-07 NOTE — ED Notes (Signed)
[facility] here to transport pt to [facility].

## 2016-12-07 NOTE — ED Provider Notes (Signed)
[facility] DEPT Provider Note   CSN: [id number] Arrival date & time: 12/07/16  1028     History   Chief Complaint Chief Complaint  Patient presents with  . Nausea  . Emesis    HPI Rebekah Henderson is a 61 y.o. female.  Patient history of small cell lung cancer diagnosed in October undergoing chemotherapy treatment hematology oncology at [facility] since November. Patient just recently started a new course of chemotherapy on January 4. Patient presents today with flulike illness. With vomiting diarrhea fevers going short of breath bodyaches vomiting and diarrhea could be related to the chemotherapy. The patient continued his regimen before and didn't have any significant problems. Patient does have antinausea medicine at home. Patient without any known sick contacts.      Past Medical History:  Diagnosis Date  . 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 Anxiety    takes Prozac daily  . Arthritis   . Arthritis   . Cancer ([place])    skin, lung  . Colon polyps   . COPD (chronic obstructive pulmonary disease) ([place])   . Diverticulitis   . Dyspnea    with exertion  . Encounter for antineoplastic chemotherapy 12/03/2016  . Fibromyalgia   . GERD (gastroesophageal reflux disease)    takes Pantoprazole daily  . Hypertension    takes Metoprolol,Triamterene-HCTZ,and Amlodipine daily  . Hypothyroidism    takes Synthroid daily  . IBS (irritable bowel syndrome)   . PONV (postoperative nausea and vomiting)    pt also states that she had some difficulty breathing after cervical fusion    Patient Active Problem List   Diagnosis Date Noted  . Fever 12/07/2016  . Encounter for antineoplastic chemotherapy 12/03/2016  . 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 Protein-calorie malnutrition, severe 10/30/2016  . Vomiting 10/29/2016  . Abdominal pain 10/29/2016  . Rash and nonspecific skin eruption 10/29/2016  . Neutropenic fever ([place]) 10/15/2016    . Intractable nausea and vomiting 10/15/2016  . Pancytopenia due to antineoplastic chemotherapy ([place]) 10/15/2016  . Nodular rash 10/15/2016  . Hypokalemia 10/15/2016  . Hypomagnesemia 10/15/2016  . Chronic respiratory failure with hypoxia ([place]) 10/15/2016  . Small cell lung cancer, left ([place]) 09/19/2016  . Mediastinal mass 09/10/2016  . COPD (chronic obstructive pulmonary disease) ([place])   . Hypertension   . Fibromyalgia   . IBS (irritable bowel syndrome)   . GERD (gastroesophageal reflux disease)   . Hypothyroidism   . Colon polyps   . Anxiety   . PONV (postoperative nausea and vomiting)   . Diarrhea 05/12/2013    Past Surgical History:  Procedure Laterality Date  . BIOPSY N/A 05/25/2013   Procedure: BIOPSIES (Random Colon; Duodenal; Gastric);  Surgeon: Danie Binder, MD;  Location: [facility];  Service: Endoscopy;  Laterality: N/A;  . BLADDER SUSPENSION    . BREAST ENHANCEMENT SURGERY    . BREAST IMPLANT REMOVAL    . CERVICAL FUSION  AUG 2013  . CHOLECYSTECTOMY  1999  . COLONOSCOPY  2007 [place]   POLYPS  . COLONOSCOPY WITH PROPOFOL N/A 05/25/2013   Procedure: COLONOSCOPY WITH PROPOFOL(at cecum 0957) total withdrawal time=17mn);  Surgeon: SDanie Binder MD;  Location: [facility];  Service: Endoscopy;  Laterality: N/A;  . ESOPHAGOGASTRODUODENOSCOPY (EGD) WITH PROPOFOL N/A 05/25/2013   Procedure: ESOPHAGOGASTRODUODENOSCOPY (EGD) WITH PROPOFOL;  Surgeon: SDanie Binder MD;  Location: [facility];  Service: Endoscopy;  Laterality: N/A;  . FOOT SURGERY    . POLYPECTOMY N/A 05/25/2013   Procedure:  POLYPECTOMY (Rectal and Gastric);  Surgeon: Danie Binder, MD;  Location: [facility];  Service: Endoscopy;  Laterality: N/A;  . TONSILLECTOMY    . UPPER GASTROINTESTINAL ENDOSCOPY    . VIDEO BRONCHOSCOPY WITH ENDOBRONCHIAL ULTRASOUND  09/12/2016   Procedure: VIDEO BRONCHOSCOPY WITH ENDOBRONCHIAL ULTRASOUND AND BIOPSY;  Surgeon: Juanito Doom, MD;  Location: [facility] OR;  Service: Cardiopulmonary;;     OB History    No data available       Home Medications    Prior to Admission medications   Medication Sig Start Date End Date Taking? Authorizing Provider  albuterol (PROVENTIL) 2 MG tablet Take 2 mg by mouth daily as needed for wheezing or shortness of breath.    Yes Historical Provider, MD  amLODipine (NORVASC) 5 MG tablet Take 5 mg by mouth daily.     Yes Historical Provider, MD  aspirin EC 81 MG tablet Take 81 mg by mouth every other day.    Yes Historical Provider, MD  estazolam (PROSOM) 2 MG tablet Take 2 mg by mouth at bedtime.   Yes Historical Provider, MD  estradiol (ESTRACE) 2 MG tablet Take 2 mg by mouth daily.     Yes Historical Provider, MD  FLUoxetine (PROZAC) 40 MG capsule Take 40 mg by mouth daily.  06/03/15  Yes Historical Provider, MD  HYDROcodone-acetaminophen (NORCO/VICODIN) 5-325 MG tablet Take 1 tablet by mouth every 4 (four) hours as needed for moderate pain.  10/14/16  Yes Historical Provider, MD  levothyroxine (SYNTHROID, LEVOTHROID) 25 MCG tablet Take 25 mcg by mouth daily.     Yes Historical Provider, MD  loperamide (IMODIUM) 2 MG capsule Take 2 mg by mouth every 2 (two) hours as needed for diarrhea or loose stools.   Yes Historical Provider, MD  magnesium oxide (MAG-OX) 400 (241.3 Mg) MG tablet Take 1 tablet (400 mg total) by mouth 3 (three) times daily. 10/14/16  Yes Curt Bears, MD  metoprolol succinate (TOPROL-XL) 25 MG 24 hr tablet Take 25 mg by mouth daily.   Yes Historical Provider, MD  Multiple Vitamin (MULTIVITAMIN WITH MINERALS) TABS tablet Take 2 tablets by mouth daily.   Yes Historical Provider, MD  ondansetron (ZOFRAN) 8 MG tablet Take 1 tablet (8 mg total) by mouth 3 (three) times daily as needed for nausea or vomiting. 11/18/16  Yes Curt Bears, MD  pantoprazole (PROTONIX) 40 MG tablet Take 10 mg by mouth daily. 11/27/16  Yes Historical Provider, MD  PROAIR HFA 108 (90 BASE) MCG/ACT inhaler Inhale 1-2 puffs into the lungs every 4 (four)  hours as needed.  05/29/15  Yes Historical Provider, MD  triamterene-hydrochlorothiazide (DYAZIDE) 37.5-25 MG per capsule Take 1 capsule by mouth every morning.     Yes Historical Provider, MD  bacitracin ointment Apply topically as needed for wound care (skin biopsy). Patient not taking: Reported on 12/07/2016 11/02/16   Robbie Lis, MD  prochlorperazine (COMPAZINE) 10 MG tablet Take 1 tablet (10 mg total) by mouth every 6 (six) hours as needed for nausea or vomiting. Patient not taking: Reported on 12/03/2016 09/19/16   Curt Bears, MD    Family History Family History  Problem Relation Age of Onset  . Breast cancer Mother   . Diabetes Maternal Grandfather   . Lung cancer Father   . Heart failure Sister 3    Died. Morbidly obese  . Colon cancer Neg Hx   . Colon polyps Neg Hx     Social History Social History  Substance Use Topics  .  Smoking status: Former Smoker    Packs/day: 2.00    Years: 20.00    Quit date: 05/20/2004  . Smokeless tobacco: Never Used  . Alcohol use No     Allergies   Penicillins; Codeine; Ranitidine hcl; Keflex [cephalexin]; and Lyrica [pregabalin]   Review of Systems Review of Systems  Constitutional: Positive for fever.  HENT: Positive for congestion.   Eyes: Negative for visual disturbance.  Respiratory: Positive for cough, shortness of breath and wheezing.   Cardiovascular: Negative for chest pain.  Gastrointestinal: Positive for diarrhea, nausea and vomiting. Negative for abdominal pain.  Genitourinary: Negative for dysuria.  Musculoskeletal: Positive for myalgias.  Skin: Positive for rash.  Allergic/Immunologic: Positive for immunocompromised state.  Neurological: Negative for headaches.  Hematological: Does not bruise/bleed easily.  Psychiatric/Behavioral: Negative for confusion.     Physical Exam Updated Vital Signs BP 112/83   Pulse (!) 123   Temp 100.9 F (38.3 C) (Oral)   Resp 26   SpO2 95%   Physical Exam   Constitutional: She is oriented to person, place, and time. She appears well-developed and well-nourished. No distress.  HENT:  Head: Normocephalic and atraumatic.  Mouth/Throat: Oropharynx is clear and moist.  Eyes: Conjunctivae and EOM are normal. Pupils are equal, round, and reactive to light. No scleral icterus.  Neck: Normal range of motion. Neck supple.  Cardiovascular: Normal rate, regular rhythm and normal heart sounds.   Pulmonary/Chest: Effort normal and breath sounds normal. No respiratory distress. She has no wheezes.  Abdominal: Soft. Bowel sounds are normal. There is no tenderness.  Musculoskeletal: Normal range of motion.  Neurological: She is alert and oriented to person, place, and time. No cranial nerve deficit or sensory deficit. She exhibits normal muscle tone. Coordination normal.  Skin: Skin is warm. Capillary refill takes less than 2 seconds. Rash noted.  Bilateral lower extremities and feet. Not new.  Nursing note and vitals reviewed.    ED Treatments / Results  Labs (all labs ordered are listed, but only abnormal results are displayed) Labs Reviewed  COMPREHENSIVE METABOLIC PANEL - Abnormal; Notable for the following:       Result Value   Sodium 134 (*)    Chloride 97 (*)    Glucose, Bld 106 (*)    Calcium 8.8 (*)    Albumin 3.4 (*)    All other components within normal limits  CBC WITH DIFFERENTIAL/PLATELET - Abnormal; Notable for the following:    RBC 3.17 (*)    Hemoglobin 9.6 (*)    HCT 28.6 (*)    RDW 16.5 (*)    Platelets 478 (*)    Lymphs Abs 0.3 (*)    All other components within normal limits  CULTURE, BLOOD (ROUTINE X 2)  CULTURE, BLOOD (ROUTINE X 2)  LIPASE, BLOOD  PROTIME-INR  URINALYSIS, ROUTINE W REFLEX MICROSCOPIC  INFLUENZA PANEL BY PCR (TYPE A & B, H1N1)  I-STAT CG4 LACTIC ACID, ED   Results for orders placed or performed during the hospital encounter of 12/07/16  Culture, blood (Routine X 2) w Reflex to ID Panel  Result Value  Ref Range   Specimen Description LEFT ANTECUBITAL    Special Requests BOTTLES DRAWN AEROBIC AND ANAEROBIC 4CC EACH    Culture PENDING    Report Status PENDING   Comprehensive metabolic panel  Result Value Ref Range   Sodium 134 (L) 135 - 145 mmol/L   Potassium 3.7 3.5 - 5.1 mmol/L   Chloride 97 (L) 101 - 111 mmol/L  CO2 27 22 - 32 mmol/L   Glucose, Bld 106 (H) 65 - 99 mg/dL   BUN 13 6 - 20 mg/dL   Creatinine, Ser 0.76 0.44 - 1.00 mg/dL   Calcium 8.8 (L) 8.9 - 10.3 mg/dL   Total Protein 6.9 6.5 - 8.1 g/dL   Albumin 3.4 (L) 3.5 - 5.0 g/dL   AST 27 15 - 41 U/L   ALT 16 14 - 54 U/L   Alkaline Phosphatase 43 38 - 126 U/L   Total Bilirubin 0.8 0.3 - 1.2 mg/dL   GFR calc non Af Amer >60 >60 mL/min   GFR calc Af Amer >60 >60 mL/min   Anion gap 10 5 - 15  Lipase, blood  Result Value Ref Range   Lipase 22 11 - 51 U/L  CBC with Differential/Platelet  Result Value Ref Range   WBC 5.3 4.0 - 10.5 K/uL   RBC 3.17 (L) 3.87 - 5.11 MIL/uL   Hemoglobin 9.6 (L) 12.0 - 15.0 g/dL   HCT 28.6 (L) 36.0 - 46.0 %   MCV 90.2 78.0 - 100.0 fL   MCH 30.3 26.0 - 34.0 pg   MCHC 33.6 30.0 - 36.0 g/dL   RDW 16.5 (H) 11.5 - 15.5 %   Platelets 478 (H) 150 - 400 K/uL   Neutrophils Relative % 92 %   Neutro Abs 4.9 1.7 - 7.7 K/uL   Lymphocytes Relative 6 %   Lymphs Abs 0.3 (L) 0.7 - 4.0 K/uL   Monocytes Relative 2 %   Monocytes Absolute 0.1 0.1 - 1.0 K/uL   Eosinophils Relative 0 %   Eosinophils Absolute 0.0 0.0 - 0.7 K/uL   Basophils Relative 0 %   Basophils Absolute 0.0 0.0 - 0.1 K/uL  Protime-INR  Result Value Ref Range   Prothrombin Time 13.0 11.4 - 15.2 seconds   INR 0.98      EKG  EKG Interpretation None       Radiology Dg Chest Port 1 View  Result Date: 12/07/2016 CLINICAL DATA:  Shortness of Breath EXAM: PORTABLE CHEST 1 VIEW COMPARISON:  10/19/2016 FINDINGS: The heart size and mediastinal contours are within normal limits. Both lungs are clear. The visualized skeletal structures are  unremarkable. Postsurgical changes cervical spine is noted. IMPRESSION: No active disease. Electronically Signed   By: Inez Catalina M.D.   On: 12/07/2016 13:38    Procedures Procedures (including critical care time)  Medications Ordered in ED Medications  0.9 %  sodium chloride infusion ( Intravenous New Bag/Given 12/07/16 1420)  sodium chloride 0.9 % bolus 500 mL (not administered)  ondansetron (ZOFRAN) injection 4 mg (4 mg Intravenous Given 12/07/16 1040)  ondansetron (ZOFRAN) injection 4 mg (4 mg Intravenous Given 12/07/16 1155)  sodium chloride 0.9 % bolus 500 mL (0 mLs Intravenous Stopped 12/07/16 1418)  HYDROmorphone (DILAUDID) injection 1 mg (1 mg Intravenous Given 12/07/16 1418)     Initial Impression / Assessment and Plan / ED Course  I have reviewed the triage vital signs and the nursing notes.  Pertinent labs & imaging results that were available during my care of the patient were reviewed by me and considered in my medical decision making (see chart for details).  Clinical Course     Patient with history of small cell lung cancer currently undergoing chemotherapy and radiation treatment. Started a course of chemotherapy chest on January 4. The chemotherapy regimen known to cause some problems with vomiting and diarrhea the patient had been on this before without too  many difficulties. Patient was sudden onset of feeling poorly yesterday patient feels as if she has fever feels as if she has flulike symptoms. Patient feels short of breath but she's not on her normal 2 L of oxygen. Patient with nausea vomiting and diarrhea. Patient's hematologist oncologist is Dr. Earlie Server.  Discussed with on-call hematology oncology they recommend admission at [facility] long where they can consult on her. Patient will be transferred from here to the internal medicine service at [facility] long by our internal medicine service here.  Patient's temporary admit orders completed and transfer forms  completed.  Patient's chest x-ray here is negative for pneumonia.  vital signs show a persistent tachycardia. Temp was 100.9. Labs without significant change particularly on the circumstances of recently getting chemotherapy. Screening test for influenza is pending. Lactic acid pending blood cultures ordered. Antibiotics not recommended by hematology oncology. Patient without any wheezing.  Final Clinical Impressions(s) / ED Diagnoses   Final diagnoses:  Malignant neoplasm of lung, unspecified laterality, unspecified part of lung ([place])  Fever, unspecified fever cause  Flu-like symptoms    New Prescriptions New Prescriptions   No medications on file     Fredia Sorrow, MD 12/07/16 1546

## 2016-12-07 NOTE — H&P (Addendum)
History and Physical  Rebekah Henderson ATF:[id number] DOB: 1956/06/24 DOA: 12/07/2016  Referring physician: Dr Rogene Houston, ED physician PCP: Glo Herring., MD  Outpatient Specialists:   Julien Nordmann (Oncology)  Chief Complaint: Fever, diarrhea, nausea and vomiting.  HPI: Rebekah Henderson is a 61 y.o. 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 Patient is followed by Dr. Earlie Server. Patient presents with nausea and vomiting and diarrhea that started today while she was on her way to [place] to receive Neulasta injection. Within a few minutes, the patient became nauseated and started vomiting and began to have diarrhea. She returned to home and called EMS to have her bring her to the hospital. She does report increasing cough over the past 48 hours that is starting to become productive with yellow sputum. She reported that her fever started last night. She does report body aches and chills.  Emergency Department Course: Patient responded well to IV fluids, antiemetics. Patient white count is normal and chest x-ray is without evidence of acute pneumonia..  Review of Systems:   Pt complains of chronic lower ext rash, headache.  Pt denies any constipation, abdominal pain, orthopnea, wheezing, palpitations, headache, vision changes, lightheadedness, dizziness, melena, rectal bleeding.  Review of systems are otherwise negative  Past Medical History:  Diagnosis Date  . 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 Anxiety    takes Prozac daily  . Arthritis   . Arthritis   . Cancer ([place])    skin, lung  . Colon polyps   . COPD (chronic obstructive pulmonary disease) ([place])   . Diverticulitis   . Dyspnea    with exertion  . Encounter for antineoplastic chemotherapy 12/03/2016  . Fibromyalgia   . GERD (gastroesophageal reflux disease)    takes Pantoprazole daily  . Hypertension    takes Metoprolol,Triamterene-HCTZ,and Amlodipine daily  . Hypothyroidism    takes Synthroid daily  . IBS (irritable bowel syndrome)   . PONV (postoperative nausea and vomiting)    pt also states that she had some difficulty breathing after cervical fusion   Past Surgical History:  Procedure Laterality Date  . BIOPSY N/A 05/25/2013   Procedure: BIOPSIES (Random Colon; Duodenal; Gastric);  Surgeon: Danie Binder, MD;  Location: [facility];  Service: Endoscopy;  Laterality: N/A;  . BLADDER SUSPENSION    . BREAST ENHANCEMENT SURGERY    . BREAST IMPLANT REMOVAL    . CERVICAL FUSION  AUG 2013  . CHOLECYSTECTOMY  1999  . COLONOSCOPY  2007 [place]   POLYPS  . COLONOSCOPY WITH PROPOFOL N/A 05/25/2013   Procedure: COLONOSCOPY WITH PROPOFOL(at cecum 0957) total withdrawal time=35mn);  Surgeon: SDanie Binder MD;  Location: [facility];  Service: Endoscopy;  Laterality: N/A;  . ESOPHAGOGASTRODUODENOSCOPY (EGD) WITH PROPOFOL N/A 05/25/2013   Procedure: ESOPHAGOGASTRODUODENOSCOPY (EGD) WITH PROPOFOL;  Surgeon: SDanie Binder MD;  Location: [facility];  Service: Endoscopy;  Laterality: N/A;  . FOOT SURGERY    . POLYPECTOMY N/A 05/25/2013   Procedure: POLYPECTOMY (Rectal and Gastric);  Surgeon: SDanie Binder MD;  Location: [facility];  Service: Endoscopy;  Laterality: N/A;  . TONSILLECTOMY    . UPPER GASTROINTESTINAL ENDOSCOPY    . VIDEO BRONCHOSCOPY WITH ENDOBRONCHIAL ULTRASOUND  09/12/2016   Procedure: VIDEO BRONCHOSCOPY WITH ENDOBRONCHIAL ULTRASOUND AND BIOPSY;  Surgeon: DJuanito Doom MD;  [facility]  Service: Cardiopulmonary;;   Social History:  reports that she quit smoking about  12 years ago. She has a 40.00 pack-year smoking history. She has never used smokeless tobacco. She reports that she does not drink alcohol or use drugs. Patient lives at home  Allergies  Allergen Reactions  . Penicillins Hives and Swelling    Has patient had a PCN reaction causing immediate rash, facial/tongue/throat  swelling, SOB or lightheadedness with hypotension:unsure  Has patient had a PCN reaction causing severe rash involving mucus membranes or skin necrosis:unsure Has patient had a PCN reaction that required hospitalization:No Has patient had a PCN reaction occurring within the last 10 years:No If all of the above answers are "NO", then may proceed with Cephalosporin use.   . Codeine Nausea Only  . Ranitidine Hcl Other (See Comments)    dizzy  . Keflex [Cephalexin] Other (See Comments)    UNSPECIFIED REACTION   . Lyrica [Pregabalin] Other (See Comments)    lethargic    Family History  Problem Relation Age of Onset  . Breast cancer Mother   . Diabetes Maternal Grandfather   . Lung cancer Father   . Heart failure Sister 44    Died. Morbidly obese  . Colon cancer Neg Hx   . Colon polyps Neg Hx       Prior to Admission medications   Medication Sig Start Date End Date Taking? Authorizing Provider  albuterol (PROVENTIL) 2 MG tablet Take 2 mg by mouth daily as needed for wheezing or shortness of breath.    Yes Historical Provider, MD  amLODipine (NORVASC) 5 MG tablet Take 5 mg by mouth daily.     Yes Historical Provider, MD  aspirin EC 81 MG tablet Take 81 mg by mouth every other day.    Yes Historical Provider, MD  estazolam (PROSOM) 2 MG tablet Take 2 mg by mouth at bedtime.   Yes Historical Provider, MD  estradiol (ESTRACE) 2 MG tablet Take 2 mg by mouth daily.     Yes Historical Provider, MD  FLUoxetine (PROZAC) 40 MG capsule Take 40 mg by mouth daily.  06/03/15  Yes Historical Provider, MD  HYDROcodone-acetaminophen (NORCO/VICODIN) 5-325 MG tablet Take 1 tablet by mouth every 4 (four) hours as needed for moderate pain.  10/14/16  Yes Historical Provider, MD  levothyroxine (SYNTHROID, LEVOTHROID) 25 MCG tablet Take 25 mcg by mouth daily.     Yes Historical Provider, MD  loperamide (IMODIUM) 2 MG capsule Take 2 mg by mouth every 2 (two) hours as needed for diarrhea or loose stools.   Yes  Historical Provider, MD  magnesium oxide (MAG-OX) 400 (241.3 Mg) MG tablet Take 1 tablet (400 mg total) by mouth 3 (three) times daily. 10/14/16  Yes Curt Bears, MD  metoprolol succinate (TOPROL-XL) 25 MG 24 hr tablet Take 25 mg by mouth daily.   Yes Historical Provider, MD  Multiple Vitamin (MULTIVITAMIN WITH MINERALS) TABS tablet Take 2 tablets by mouth daily.   Yes Historical Provider, MD  ondansetron (ZOFRAN) 8 MG tablet Take 1 tablet (8 mg total) by mouth 3 (three) times daily as needed for nausea or vomiting. 11/18/16  Yes Curt Bears, MD  pantoprazole (PROTONIX) 40 MG tablet Take 10 mg by mouth daily. 11/27/16  Yes Historical Provider, MD  PROAIR HFA 108 (90 BASE) MCG/ACT inhaler Inhale 1-2 puffs into the lungs every 4 (four) hours as needed.  05/29/15  Yes Historical Provider, MD  triamterene-hydrochlorothiazide (DYAZIDE) 37.5-25 MG per capsule Take 1 capsule by mouth every morning.     Yes Historical Provider, MD  bacitracin ointment  Apply topically as needed for wound care (skin biopsy). Patient not taking: Reported on 12/07/2016 11/02/16   Robbie Lis, MD  prochlorperazine (COMPAZINE) 10 MG tablet Take 1 tablet (10 mg total) by mouth every 6 (six) hours as needed for nausea or vomiting. Patient not taking: Reported on 12/03/2016 09/19/16   Curt Bears, MD    Physical Exam: BP 112/83   Pulse (!) 123   Temp 100.9 F (38.3 C) (Oral)   Resp 26   SpO2 95%   General: Elderly Caucasian female. Awake and alert and oriented x3. No acute cardiopulmonary distress.  HEENT: Normocephalic atraumatic.  Right and left ears normal in appearance.  Pupils equal, round, reactive to light. Extraocular muscles are intact. Sclerae anicteric and noninjected.  Moist mucosal membranes. No mucosal lesions.  Neck: Neck supple without lymphadenopathy. No carotid bruits. No masses palpated.  Cardiovascular: Regular rate with normal S1-S2 sounds. No murmurs, rubs, gallops auscultated. No JVD.    Respiratory: Prolonged exhalation phase. Diminished breath sounds throughout. No wheezes or rales observed. Abdomen: Soft, nontender, nondistended. Active bowel sounds. No masses or hepatosplenomegaly  Skin: No rashes, lesions, or ulcerations.  Dry, warm to touch. 2+ dorsalis pedis and radial pulses. Musculoskeletal: No calf or leg pain. All major joints not erythematous nontender.  No upper or lower joint deformation.  Good ROM.  No contractures  Psychiatric: Intact judgment and insight. Pleasant and cooperative. Neurologic: No focal neurological deficits. Strength is 5/5 and symmetric in upper and lower extremities.  Cranial nerves II through XII are grossly intact.           Labs on Admission: I have personally reviewed following labs and imaging studies  CBC:  Recent Labs Lab 12/03/16 1028 12/07/16 1352  WBC 12.2* 5.3  NEUTROABS 9.7* 4.9  HGB 10.8* 9.6*  HCT 32.1* 28.6*  MCV 88.3 90.2  PLT 604* 778*   Basic Metabolic Panel:  Recent Labs Lab 12/03/16 1028 12/07/16 1352  NA 138 134*  K 3.8 3.7  CL  --  97*  CO2 24 27  GLUCOSE 96 106*  BUN 10.2 13  CREATININE 0.8 0.76  CALCIUM 10.0 8.8*  MG 1.9  --    GFR: Estimated Creatinine Clearance: 74.7 mL/min (by C-G formula based on SCr of 0.76 mg/dL). Liver Function Tests:  Recent Labs Lab 12/03/16 1028 12/07/16 1352  AST 10 27  ALT 8 16  ALKPHOS 62 43  BILITOT 0.56 0.8  PROT 7.7 6.9  ALBUMIN 3.4* 3.4*    Recent Labs Lab 12/07/16 1352  LIPASE 22   No results for input(s): AMMONIA in the last 168 hours. Coagulation Profile:  Recent Labs Lab 12/07/16 1352  INR 0.98   Cardiac Enzymes: No results for input(s): CKTOTAL, CKMB, CKMBINDEX, TROPONINI in the last 168 hours. BNP (last 3 results) No results for input(s): PROBNP in the last 8760 hours. HbA1C: No results for input(s): HGBA1C in the last 72 hours. CBG: No results for input(s): GLUCAP in the last 168 hours. Lipid Profile: No results for  input(s): CHOL, HDL, LDLCALC, TRIG, CHOLHDL, LDLDIRECT in the last 72 hours. Thyroid Function Tests: No results for input(s): TSH, T4TOTAL, FREET4, T3FREE, THYROIDAB in the last 72 hours. Anemia Panel: No results for input(s): VITAMINB12, FOLATE, FERRITIN, TIBC, IRON, RETICCTPCT in the last 72 hours. Urine analysis:    Component Value Date/Time   COLORURINE AMBER (A) 10/19/2016 1256   APPEARANCEUR CLOUDY (A) 10/19/2016 1256   LABSPEC 1.022 10/19/2016 1256   PHURINE 6.5 10/19/2016 1256  GLUCOSEU NEGATIVE 10/19/2016 1256   HGBUR NEGATIVE 10/19/2016 1256   BILIRUBINUR MODERATE (A) 10/19/2016 1256   KETONESUR 40 (A) 10/19/2016 1256   PROTEINUR NEGATIVE 10/19/2016 1256   UROBILINOGEN 0.2 03/10/2014 1027   NITRITE NEGATIVE 10/19/2016 1256   LEUKOCYTESUR NEGATIVE 10/19/2016 1256   Sepsis Labs: '@LABRCNTIP'$ (procalcitonin:4,lacticidven:4) ) Recent Results (from the past 240 hour(s))  Culture, blood (Routine X 2) w Reflex to ID Panel     Status: None (Preliminary result)   Collection Time: 12/07/16  1:52 PM  Result Value Ref Range Status   Specimen Description LEFT ANTECUBITAL  Final   Special Requests BOTTLES DRAWN AEROBIC AND ANAEROBIC 4CC EACH  Final   Culture PENDING  Incomplete   Report Status PENDING  Incomplete     Radiological Exams on Admission: Dg Chest Port 1 View  Result Date: 12/07/2016 CLINICAL DATA:  Shortness of Breath EXAM: PORTABLE CHEST 1 VIEW COMPARISON:  10/19/2016 FINDINGS: The heart size and mediastinal contours are within normal limits. Both lungs are clear. The visualized skeletal structures are unremarkable. Postsurgical changes cervical spine is noted. IMPRESSION: No active disease. Electronically Signed   By: Inez Catalina M.D.   On: 12/07/2016 13:38    Assessment/Plan: Principal Problem:   SIRS (systemic inflammatory response syndrome) (H[facility]) Active Problems:   Diarrhea   COPD (chronic obstructive pulmonary disease) (H[facility])   Small cell lung cancer, left  (HCC)   Intractable nausea and vomiting   Hypoxia   Bronchitis    This patient was discussed with the ED physician, including pertinent vitals, physical exam findings, labs, and imaging.  We also discussed care given by the ED provider.  #1 SIRS  Admit to [employer] of fever uncertain at this point - could be part of bronchitis and hypoxia versus flu versus gastroenteritis  Blood cultures pending, flu swab pending  Due to SIRS and increasing tachycardia, will start antibiotics, particularly as patient is at risk of becoming septic  Lactic acid normal  Start vancomycin and meropenem per pharmacy consult  Check CBC tomorrow #2 diarrhea  No abdominal pain  Possibly secondary to chemotherapy  Continue Imodium #3 hypoxia  Oxygen therapy #4 bronchitis  No wheezing at this point  Continue neb treatments #5 nausea and vomiting  Zofran #6 COPD #7 small cell lung cancer  Consult oncology  DVT prophylaxis: Lovenox Consultants: Oncology Code Status: Full code Family Communication: Daughter in the room  Disposition Plan: Patient should be able to return home following admission   Truett Mainland, DO [facility] Pager [phone]  If 7PM-7AM, please contact night-coverage [email or web address] Password [email or web address]

## 2016-12-07 NOTE — Progress Notes (Signed)
Patient Name: Rebekah Henderson, female   DOB: 09-23-1956, 61 y.o.  MRN: [id number]  Flu swab positive for influenza A. Will D/c antibiotics and start Tamiflu.  Truett Mainland, DO 12/07/2016 5:48 PM

## 2016-12-07 NOTE — ED Notes (Signed)
Pt says nausea, vomiting and diarrhea started this morning around 0900.

## 2016-12-07 NOTE — ED Notes (Signed)
Pt cleaned after incontinents of stool

## 2016-12-07 NOTE — ED Triage Notes (Signed)
Pt being treated for small cell lung cancer. Pt recently started new chemo injection- pt reports she was on way to [place] to receive 2nd injection. Pt reports after 1st injection n/v. Pt currently has dry heaves upon arrival to ER.

## 2016-12-07 NOTE — Progress Notes (Signed)
Pharmacy Antibiotic Note  Rebekah Henderson is a 61 y.o. female admitted on 12/07/2016 with sepsis.  Pharmacy has been consulted for vancomycin and meropenem dosing.  Plan: Vancomycin 1500 mg IV X 1 then 1gm IV q12 hours Meropenem 1 gm IV q8 hours F/u renal function, cultures and clinical course Plan to transfer to [facility]     Temp (24hrs), Avg:99.7 F (37.6 C), Min:98.5 F (36.9 C), Max:100.9 F (38.3 C)   Recent Labs Lab 12/03/16 1028 12/03/16 1028 12/07/16 1352  WBC 12.2*  --  5.3  CREATININE  --  0.8 0.76  LATICACIDVEN  --   --  1.7    Estimated Creatinine Clearance: 74.7 mL/min (by C-G formula based on SCr of 0.76 mg/dL).    Allergies  Allergen Reactions  . Penicillins Hives and Swelling    Has patient had a PCN reaction causing immediate rash, facial/tongue/throat swelling, SOB or lightheadedness with hypotension:unsure  Has patient had a PCN reaction causing severe rash involving mucus membranes or skin necrosis:unsure Has patient had a PCN reaction that required hospitalization:No Has patient had a PCN reaction occurring within the last 10 years:No If all of the above answers are "NO", then may proceed with Cephalosporin use.   . Codeine Nausea Only  . Ranitidine Hcl Other (See Comments)    dizzy  . Keflex [Cephalexin] Other (See Comments)    UNSPECIFIED REACTION   . Lyrica [Pregabalin] Other (See Comments)    lethargic    Antimicrobials this admission: vanc 1/6  >>  meropenem 1/6 >>    Thank you for allowing pharmacy to be a part of this patient's care.  Excell Seltzer Poteet 12/07/2016 4:40 PM

## 2016-12-08 ENCOUNTER — Encounter (HOSPITAL_COMMUNITY): Payer: Self-pay | Admitting: *Deleted

## 2016-12-08 LAB — CBC
HCT: 24.8 % — ABNORMAL LOW (ref 36.0–46.0)
Hemoglobin: 8.3 g/dL — ABNORMAL LOW (ref 12.0–15.0)
MCH: 28.7 pg (ref 26.0–34.0)
MCHC: 33.5 g/dL (ref 30.0–36.0)
MCV: 85.8 fL (ref 78.0–100.0)
Platelets: 411 10*3/uL — ABNORMAL HIGH (ref 150–400)
RBC: 2.89 MIL/uL — ABNORMAL LOW (ref 3.87–5.11)
RDW: 16.3 % — AB (ref 11.5–15.5)
WBC: 4.4 10*3/uL (ref 4.0–10.5)

## 2016-12-08 LAB — BASIC METABOLIC PANEL
Anion gap: 9 (ref 5–15)
BUN: 9 mg/dL (ref 6–20)
CO2: 26 mmol/L (ref 22–32)
Calcium: 8 mg/dL — ABNORMAL LOW (ref 8.9–10.3)
Chloride: 100 mmol/L — ABNORMAL LOW (ref 101–111)
Creatinine, Ser: 0.68 mg/dL (ref 0.44–1.00)
GFR calc Af Amer: >60 mL/min (ref >60)
GFR calc non Af Amer: >60 mL/min (ref >60)
GLUCOSE: 95 mg/dL (ref 65–99)
POTASSIUM: 2.8 mmol/L — AB (ref 3.5–5.1)
SODIUM: 135 mmol/L (ref 135–145)

## 2016-12-08 LAB — LACTIC ACID, PLASMA
LACTIC ACID, VENOUS: 1.6 mmol/L (ref 0.5–1.9)
Lactic Acid, Venous: 2.4 mmol/L (ref 0.5–1.9)

## 2016-12-08 LAB — STREP PNEUMONIAE URINARY ANTIGEN: Strep Pneumo Urinary Antigen: NEGATIVE

## 2016-12-08 MED ORDER — POTASSIUM CHLORIDE CRYS ER 20 MEQ PO TBCR
40.0000 meq | EXTENDED_RELEASE_TABLET | Freq: Two times a day (BID) | ORAL | Status: DC
  Administered 2016-12-08 – 2016-12-11 (×7): 40 meq via ORAL
  Filled 2016-12-08 (×7): qty 2

## 2016-12-08 MED ORDER — GUAIFENESIN-DM 100-10 MG/5ML PO SYRP
5.0000 mL | ORAL_SOLUTION | ORAL | Status: DC | PRN
  Filled 2016-12-08: qty 10

## 2016-12-08 MED ORDER — IPRATROPIUM-ALBUTEROL 0.5-2.5 (3) MG/3ML IN SOLN
3.0000 mL | Freq: Three times a day (TID) | RESPIRATORY_TRACT | Status: DC
  Administered 2016-12-08: 3 mL via RESPIRATORY_TRACT
  Filled 2016-12-08: qty 3

## 2016-12-08 MED ORDER — IPRATROPIUM-ALBUTEROL 0.5-2.5 (3) MG/3ML IN SOLN
3.0000 mL | Freq: Two times a day (BID) | RESPIRATORY_TRACT | Status: DC
  Administered 2016-12-08 – 2016-12-09 (×2): 3 mL via RESPIRATORY_TRACT
  Filled 2016-12-08 (×2): qty 3

## 2016-12-08 MED ORDER — TRIAMTERENE-HCTZ 37.5-25 MG PO TABS
1.0000 | ORAL_TABLET | Freq: Every day | ORAL | Status: DC
  Administered 2016-12-08: 1 via ORAL
  Filled 2016-12-08: qty 1

## 2016-12-08 NOTE — Progress Notes (Signed)
Nursing Note: Pt arrived via stretcher.Pt awake ,alert and oriented to unit,room.wbb

## 2016-12-08 NOTE — Progress Notes (Signed)
PROGRESS NOTE    Rebekah Henderson  PXT:[id number] DOB: 1956/05/31 DOA: 12/07/2016 PCP: Glo Herring., MD   Brief Narrative:  Rebekah Henderson is a 61 y.o. 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 Patient is followed by Dr. Earlie Server. Patient presents with nausea and vomiting and diarrhea that started today while she was on her way to [place] to receive Neulasta injection. Within a few minutes, the patient became nauseated and started vomiting and began to have diarrhea. She returned to home and called EMS to have her bring her to the hospital. She does report increasing cough over the past 48 hours that is starting to become productive with yellow sputum. She reported that her fever started last night. She does report body aches and chills. Was worked up and admitted for N/V/D as well as respiratory illness and found to have Influenza A.   Assessment & Plan:   Principal Problem:   SIRS (systemic inflammatory response syndrome) (HCC) Active Problems:   Diarrhea   COPD (chronic obstructive pulmonary disease) (HCC)   Small cell lung cancer, left (HCC)   Intractable nausea and vomiting   Hypoxia   Bronchitis  Systemic Inflammatory Response Syndrome from Influenza A -Patient Flu A Positive -IV Abx with Vancomycin D/C'd -S/p Bolus of 1.5 L in ED -Supportive Care with IVF Rehydration with NS at 125 mL/hr -Oseltamivir 75 mg po BID x 5 days -Had Mild Temperature of 100.9, Was Tachycardic at 45 and Tachypenic at 73 -PT Consult for Generalized Weakness -CXR Negative for Pneumonia and Strep Pneumo Urine Ag -UA was Negative -Blood Cx x2 Negative <24 hours  Diarrhea possibly from Chemotherapy vs Viral Gastroenteritis -Patient states she has had no reoccurrence; No Abdominal Pain  -C/w Loperamide 4 mg po prn Diarrhea/Loose Stools -Continue to Monitor and if Increased Stools will Check  Stool Panel/C.Difficile  Lactic Acidosis -Patient's Lactic Acid peaked at 2.4 -> Improved to 1.6 -C/w IVF Rehydration with NS at 125 mL/hr  Hypokalemia suspect from GI Losses from Diarrhea -Patient's K+ went from 3.7 -> 2.8 -Replete with K-Dur 40 mEQ po BID -Repeat CMP in AM  Acute on Chronic Hypoxic Respiratory Failure likely from COPD and Influenza A and Small Cell Lung Cancer -C/w Supplemental O2 via [place]  COPD/Bronchitis -C/w Guaifenesin 600 mg po BID -C/w DuoNeb 3 mL BID and with Albuterol Neb q2hprn  Nausea and Vomiting -C/w Zofran 4 mg po q6hprn po/IV -Supportive Care with IVF at 125 mL/hr   Small Cell Lung Cancer on Chemotherapy and XRT -Pain Control with Acetaminophen Hydrocodone-Acetaminophen 1 tab po q4hprn for Moderate Pain; Received Dilaudid 0.5 mg and 1 mg IV yesterday -Oncology Consulted by EDP  Hypothyroidism -Check TSH and Free T4 -C/w Levothyroxine 25 mcg po Daily  Hypertension -C/w Amlodipine and Metoprolol Succinate 25 mg po Daily -Hold Triamterene-HCTZ 37.5-25 mg po Daily as BP's were on lower side -Continue to Monitor BP's Carefully  GERD -C/w Pantoprazole 20 mg po Daily  Anxiety/Fibromyalgia -C/w Fluoxetine 40 mg po Daily and with Temazepam 30 mg po qHS  Anemia 2/2 to Chemotherapy -Patient's Hb/Hct went from 9.6/28.6 -> 8.3/24.8 -Repeat CBC in AM and monitor for S/Sx of Bleeding -Appreciate Oncology Recc's  DVT prophylaxis: Lovenox 40 mg sq Code Status: FULL CODE Family Communication: No Family present at bedside Disposition Plan: Remain Inpatient   Consultants:   Oncology   Procedures: None   Antimicrobials: IV  Abx D/C'd; C/w Oseltamivir 75 mg po BID x 5 days  Subjective: Seen and examined at bedside and was feeling a little better but still wheezing. No more reoccurrence of N/V. States had a little bit of diarrhea/loose stools this Am. No other concerns or complaints at this time as she just feels weak.   Objective: Vitals:    12/07/16 2224 12/07/16 2235 12/08/16 0150 12/08/16 0456  BP: 129/71   (!) 142/61  Pulse: (!) 114   (!) 105  Resp: 19   (!) 21  Temp: 98.9 F (37.2 C)   98.2 F (36.8 C)  TempSrc: Oral   Oral  SpO2: 100% 100% 98% 100%  Weight: 79.8 kg (176 lb)     Height: '5\' 4"'$  (1.626 m)       Intake/Output Summary (Last 24 hours) at 12/08/16 0746 Last data filed at 12/08/16 7824  Gross per 24 hour  Intake             4200 ml  Output                0 ml  Net             4200 ml   Filed Weights   12/07/16 2224  Weight: 79.8 kg (176 lb)   Examination: Physical Exam:  Constitutional: WN/WD, NAD and appears calm and comfortable Eyes: Lids and conjunctivae normal, sclerae anicteric  ENMT: External Ears, Nose appear normal. Grossly normal hearing.  Neck: Appears normal, supple, no cervical masses, normal ROM, no appreciable thyromegaly Respiratory: Diminished with Expiratory wheezing on Left worse than Right; No rales, rhonchi or crackles. Normal respiratory effort and patient is not tachypenic. No accessory muscle use but patient is using supplemental O2 via [place].   Cardiovascular: RRR, no murmurs / rubs / gallops. S1 and S2 auscultated. No extremity edema.  Abdomen: Soft, non-tender, non-distended. No masses palpated. No appreciable hepatosplenomegaly. Bowel sounds positive x4.  GU: Deferred. Musculoskeletal: No clubbing / cyanosis of digits/nails. No joint deformity upper and lower extremities.  Skin: No rashes, lesions, ulcers. No induration; Warm and dry.  Neurologic: CN 2-12 grossly intact with no focal deficits. Sensation intact in all 4 Extremities. Romberg sign cerebellar reflexes not assessed.  Psychiatric: Normal judgment and insight. Alert and oriented x 3. Normal mood and appropriate affect.   Data Reviewed: I have personally reviewed following labs and imaging studies  CBC:  Recent Labs Lab 12/03/16 1028 12/07/16 1352 12/08/16 0402  WBC 12.2* 5.3 4.4  NEUTROABS 9.7* 4.9  --     HGB 10.8* 9.6* 8.3*  HCT 32.1* 28.6* 24.8*  MCV 88.3 90.2 85.8  PLT 604* 478* 235*   Basic Metabolic Panel:  Recent Labs Lab 12/03/16 1028 12/07/16 1352 12/08/16 0402  NA 138 134* 135  K 3.8 3.7 2.8*  CL  --  97* 100*  CO2 '24 27 26  '$ GLUCOSE 96 106* 95  BUN 10.'2 13 9  '$ CREATININE 0.8 0.76 0.68  CALCIUM 10.0 8.8* 8.0*  MG 1.9  --   --    GFR: Estimated Creatinine Clearance: 74.5 mL/min (by C-G formula based on SCr of 0.68 mg/dL). Liver Function Tests:  Recent Labs Lab 12/03/16 1028 12/07/16 1352  AST 10 27  ALT 8 16  ALKPHOS 62 43  BILITOT 0.56 0.8  PROT 7.7 6.9  ALBUMIN 3.4* 3.4*    Recent Labs Lab 12/07/16 1352  LIPASE 22   No results for input(s): AMMONIA in the last 168 hours. Coagulation Profile:  Recent Labs Lab 12/07/16 1352  INR 0.98   Cardiac Enzymes: No results for input(s): CKTOTAL, CKMB, CKMBINDEX, TROPONINI in the last 168 hours. BNP (last 3 results) No results for input(s): PROBNP in the last 8760 hours. HbA1C: No results for input(s): HGBA1C in the last 72 hours. CBG: No results for input(s): GLUCAP in the last 168 hours. Lipid Profile: No results for input(s): CHOL, HDL, LDLCALC, TRIG, CHOLHDL, LDLDIRECT in the last 72 hours. Thyroid Function Tests: No results for input(s): TSH, T4TOTAL, FREET4, T3FREE, THYROIDAB in the last 72 hours. Anemia Panel: No results for input(s): VITAMINB12, FOLATE, FERRITIN, TIBC, IRON, RETICCTPCT in the last 72 hours. Sepsis Labs:  Recent Labs Lab 12/07/16 1352 12/07/16 2048  LATICACIDVEN 1.7 2.3*    Recent Results (from the past 240 hour(s))  Culture, blood (Routine X 2) w Reflex to ID Panel     Status: None (Preliminary result)   Collection Time: 12/07/16  1:52 PM  Result Value Ref Range Status   Specimen Description LEFT ANTECUBITAL  Final   Special Requests BOTTLES DRAWN AEROBIC AND ANAEROBIC 4CC EACH  Final   Culture NO GROWTH < 24 HOURS  Final   Report Status PENDING  Incomplete   Culture, blood (Routine X 2) w Reflex to ID Panel     Status: None (Preliminary result)   Collection Time: 12/07/16  3:22 PM  Result Value Ref Range Status   Specimen Description SITE NOT SPECIFIED  Final   Special Requests BOTTLES DRAWN AEROBIC AND ANAEROBIC [place]  Final   Culture NO GROWTH < 24 HOURS  Final   Report Status PENDING  Incomplete    Radiology Studies: Dg Chest Port 1 View  Result Date: 12/07/2016 CLINICAL DATA:  Shortness of Breath EXAM: PORTABLE CHEST 1 VIEW COMPARISON:  10/19/2016 FINDINGS: The heart size and mediastinal contours are within normal limits. Both lungs are clear. The visualized skeletal structures are unremarkable. Postsurgical changes cervical spine is noted. IMPRESSION: No active disease. Electronically Signed   By: Inez Catalina M.D.   On: 12/07/2016 13:38   Scheduled Meds: . sodium chloride   Intravenous STAT  . amLODipine  5 mg Oral Daily  . aspirin EC  81 mg Oral QODAY  . enoxaparin (LOVENOX) injection  40 mg Subcutaneous Q24H  . estradiol  2 mg Oral Daily  . FLUoxetine  40 mg Oral Daily  . guaiFENesin  600 mg Oral BID  . ipratropium-albuterol  3 mL Nebulization TID  . levothyroxine  25 mcg Oral Daily  . magnesium oxide  400 mg Oral TID  . metoprolol succinate  25 mg Oral Daily  . oseltamivir  75 mg Oral BID  . pantoprazole  20 mg Oral Daily  . potassium chloride  40 mEq Oral BID  . temazepam  30 mg Oral QHS  . triamterene-hydrochlorothiazide  1 tablet Oral Daily   Continuous Infusions: . sodium chloride 1,000 mL (12/07/16 2230)    LOS: 1 day   Kerney Elbe, DO [facility] Pager [phone]  If 7PM-7AM, please contact night-coverage [email or web address] Password [facility] 12/08/2016, 7:46 AM

## 2016-12-08 NOTE — Progress Notes (Signed)
Endorsed to [employer].

## 2016-12-08 NOTE — Progress Notes (Signed)
Lactic acid results called to Dr Chana Bode.

## 2016-12-09 ENCOUNTER — Other Ambulatory Visit: Payer: Medicare Other

## 2016-12-09 DIAGNOSIS — D6481 Anemia due to antineoplastic chemotherapy: Secondary | ICD-10-CM

## 2016-12-09 DIAGNOSIS — C3491 Malignant neoplasm of unspecified part of right bronchus or lung: Secondary | ICD-10-CM

## 2016-12-09 DIAGNOSIS — J09X2 Influenza due to identified novel influenza A virus with other respiratory manifestations: Principal | ICD-10-CM

## 2016-12-09 DIAGNOSIS — D61810 Antineoplastic chemotherapy induced pancytopenia: Secondary | ICD-10-CM

## 2016-12-09 DIAGNOSIS — R509 Fever, unspecified: Secondary | ICD-10-CM

## 2016-12-09 DIAGNOSIS — E876 Hypokalemia: Secondary | ICD-10-CM

## 2016-12-09 DIAGNOSIS — R112 Nausea with vomiting, unspecified: Secondary | ICD-10-CM

## 2016-12-09 DIAGNOSIS — J449 Chronic obstructive pulmonary disease, unspecified: Secondary | ICD-10-CM

## 2016-12-09 LAB — COMPREHENSIVE METABOLIC PANEL
ALBUMIN: 2.9 g/dL — AB (ref 3.5–5.0)
ALT: 12 U/L — AB (ref 14–54)
AST: 14 U/L — AB (ref 15–41)
Alkaline Phosphatase: 36 U/L — ABNORMAL LOW (ref 38–126)
Anion gap: 9 (ref 5–15)
BUN: 9 mg/dL (ref 6–20)
CHLORIDE: 101 mmol/L (ref 101–111)
CO2: 25 mmol/L (ref 22–32)
CREATININE: 0.53 mg/dL (ref 0.44–1.00)
Calcium: 8.3 mg/dL — ABNORMAL LOW (ref 8.9–10.3)
GFR calc Af Amer: >60 mL/min (ref >60)
Glucose, Bld: 93 mg/dL (ref 65–99)
POTASSIUM: 3.4 mmol/L — AB (ref 3.5–5.1)
SODIUM: 135 mmol/L (ref 135–145)
Total Bilirubin: 0.4 mg/dL (ref 0.3–1.2)
Total Protein: 6 g/dL — ABNORMAL LOW (ref 6.5–8.1)

## 2016-12-09 LAB — PHOSPHORUS: PHOSPHORUS: 3 mg/dL (ref 2.5–4.6)

## 2016-12-09 LAB — CBC WITH DIFFERENTIAL/PLATELET
BASOS ABS: 0 10*3/uL (ref 0.0–0.1)
BASOS PCT: 0 %
EOS ABS: 0 10*3/uL (ref 0.0–0.7)
EOS PCT: 1 %
HCT: 24 % — ABNORMAL LOW (ref 36.0–46.0)
Hemoglobin: 8.1 g/dL — ABNORMAL LOW (ref 12.0–15.0)
LYMPHS PCT: 21 %
Lymphs Abs: 0.6 10*3/uL — ABNORMAL LOW (ref 0.7–4.0)
MCH: 29 pg (ref 26.0–34.0)
MCHC: 33.8 g/dL (ref 30.0–36.0)
MCV: 86 fL (ref 78.0–100.0)
MONO ABS: 0.1 10*3/uL (ref 0.1–1.0)
Monocytes Relative: 2 %
Neutro Abs: 2.2 10*3/uL (ref 1.7–7.7)
Neutrophils Relative %: 76 %
PLATELETS: 336 10*3/uL (ref 150–400)
RBC: 2.79 MIL/uL — AB (ref 3.87–5.11)
RDW: 16.2 % — AB (ref 11.5–15.5)
WBC: 2.9 10*3/uL — AB (ref 4.0–10.5)

## 2016-12-09 LAB — MAGNESIUM: MAGNESIUM: 1.4 mg/dL — AB (ref 1.7–2.4)

## 2016-12-09 MED ORDER — IPRATROPIUM-ALBUTEROL 0.5-2.5 (3) MG/3ML IN SOLN
3.0000 mL | Freq: Three times a day (TID) | RESPIRATORY_TRACT | Status: DC
  Administered 2016-12-09 – 2016-12-11 (×5): 3 mL via RESPIRATORY_TRACT
  Filled 2016-12-09 (×5): qty 3

## 2016-12-09 MED ORDER — IPRATROPIUM-ALBUTEROL 0.5-2.5 (3) MG/3ML IN SOLN: 3.0000 mL | Freq: Four times a day (QID) | RESPIRATORY_TRACT | Status: DC

## 2016-12-09 MED ORDER — BENZONATATE 100 MG PO CAPS
100.0000 mg | ORAL_CAPSULE | Freq: Three times a day (TID) | ORAL | Status: DC | PRN
  Administered 2016-12-09: 100 mg via ORAL

## 2016-12-09 MED ORDER — METHYLPREDNISOLONE SODIUM SUCC 40 MG IJ SOLR
40.0000 mg | Freq: Two times a day (BID) | INTRAMUSCULAR | Status: DC
  Administered 2016-12-09 – 2016-12-11 (×4): 40 mg via INTRAVENOUS
  Filled 2016-12-09 (×4): qty 1

## 2016-12-09 MED ORDER — MAGNESIUM SULFATE 2 GM/50ML IV SOLN
2.0000 g | Freq: Once | INTRAVENOUS | Status: AC
  Administered 2016-12-09: 2 g via INTRAVENOUS
  Filled 2016-12-09: qty 50

## 2016-12-09 NOTE — Progress Notes (Signed)
Subjective: The patient seen and examined today. She is a very pleasant 61 years old white female recently diagnosed with limited stage small cell lung cancer currently undergoing a course of chemotherapy was carboplatin and etoposide in addition to concurrent radiotherapy. She is on her way to receive Neulasta injection after the surgical cycle of the chemotherapy when she started having significant nausea and vomiting as well as diarrhea, low-grade fever and cough. She was seen initially at [facility]. She was found to have Influenza A by PCR. She was ransferred to [facility]. She was started on IV hydration and antiemetics. She is feeling much better today. She had 2 episodes of diarrhea earlier today. She denied having any nausea or vomiting. She is currently afebrile.  Objective: Vital signs in last 24 hours: Temp:  [97.9 F (36.6 C)-98.1 F (36.7 C)] 97.9 F (36.6 C) (01/08 1354) Pulse Rate:  [83-104] 85 (01/08 1354) Resp:  [16-18] 18 (01/08 1354) BP: (90-128)/(59-78) 90/59 (01/08 1354) SpO2:  [92 %-99 %] 99 % (01/08 1354) FiO2 (%):  [98 %] 98 % (01/08 0718)  Intake/Output from previous day: 01/07 0701 - 01/08 0700 In: 3565 [P.O.:840; I.V.:2725] Out: 3 [Urine:3] Intake/Output this shift: Total I/O In: 1338.3 [P.O.:600; I.V.:738.3] Out: -   General appearance: alert, cooperative, fatigued and no distress Resp: wheezes bilaterally Cardio: regular rate and rhythm, S1, S2 normal, no murmur, click, rub or gallop GI: soft, non-tender; bowel sounds normal; no masses,  no organomegaly Extremities: extremities normal, atraumatic, no cyanosis or edema  Lab Results:   Recent Labs  12/08/16 0402 12/09/16 0418  WBC 4.4 2.9*  HGB 8.3* 8.1*  HCT 24.8* 24.0*  PLT 411* 336   BMET  Recent Labs  12/08/16 0402 12/09/16 0418  NA 135 135  K 2.8* 3.4*  CL 100* 101  CO2 26 25  GLUCOSE 95 93  BUN 9 9  CREATININE 0.68 0.53  CALCIUM 8.0* 8.3*     Studies/Results: No results found.  Medications: I have reviewed the patient's current medications.  Assessment/Plan: 1) Limited stage small cell lung cancer: status post 3 cycles of systemic chemotherapy last dose was given last week. The patient is feeling fine today. If she feels better today, she can resume her concurrent radiotherapy.she is scheduled to have repeat CT scan of the chest for restaging of her disease in less than 3 weeks. 2) chemotherapy-induced pancytopenia: the patient did not receive Neulasta injection after this treatment. Her total white blood count is declining. We will consider her for treatment with Neupogen if ANC is less than 1000. For the chemotherapy-induced anemia, she may benefit from 2 units of PRBCs transfusion. 3) influenza A: continue current supportive care. 4) COPD: the patient will continue her current treatment with Solu-Medrol and albuterol. Thank you for taking good care of Ms. Rebekah Henderson, I will continue to follow up the patient with you and assist in her management on as-needed basis.  LOS: 2 days    Mele Sylvester K. 12/09/2016

## 2016-12-09 NOTE — Progress Notes (Signed)
Initial Nutrition Assessment  DOCUMENTATION CODES:   Obesity unspecified  INTERVENTION:   Encourage small, frequent meals. Emphasized protein foods. Reviewed stool bulking foods with patient RD will continue to monitor for needs  NUTRITION DIAGNOSIS:   Increased nutrient needs related to cancer and cancer related treatments as evidenced by estimated needs.  GOAL:   Patient will meet greater than or equal to 90% of their needs  MONITOR:   PO intake, Labs, Weight trends, I & O's  REASON FOR ASSESSMENT:   Malnutrition Screening Tool    ASSESSMENT:   61 y.o. 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 Patient is followed by Dr. Earlie Server. Patient presents with nausea and vomiting and diarrhea that started today while she was on her way to [place] to receive Neulasta injection. Within a few minutes, the patient became nauseated and started vomiting and began to have diarrhea. She returned to home and called EMS to have her bring her to the hospital. She does report increasing cough over the past 48 hours that is starting to become productive with yellow sputum. She reported that her fever started last night. She does report body aches and chills. Was worked up and admitted for N/V/D as well as respiratory illness and found to have Influenza A.   Patient reports improved appetite. Denies nausea at this time but states she is still having some loose stools. Pt ate eggs, toast and breakfast potatoes this morning. PO intake: 100%. Pt reports some swallowing difficulty especially with large pills. States in the past she has had to have her esophagus "stretched". Denies issues with chewing foods. States that most foods taste normal but some drinks have a metallic taste to them. Reviewed bland foods for patient to try and to help bulk her stool. Pt declines any nutrition supplements.  Reviewed protein foods with patient and encouraged pt to prioritize protein with meals and snacks.   Per chart review, pt with no new weight for this admission d/t inability to weigh at this time. Will continue to monitor weight trends. Nutrition focused physical exam shows no sign of depletion of muscle mass or body fat.  Medications: MAG-OX tablet TID, K-DUR tablet BID, IV Zofran PRN Labs reviewed: Low K, Mg Phos WNL  Diet Order:  Diet Heart Room service appropriate? Yes; Fluid consistency: Thin  Skin:  Reviewed, no issues  Last BM:  1/8  Height:   Ht Readings from Last 1 Encounters:  12/07/16 '5\' 4"'$  (1.626 m)    Weight:   Wt Readings from Last 1 Encounters:  12/07/16 176 lb (79.8 kg)    Ideal Body Weight:  54.5 kg  BMI:  Body mass index is 30.21 kg/m.  Estimated Nutritional Needs:   Kcal:  1850-2050  Protein:  80-90g  Fluid:  2L/day  EDUCATION NEEDS:   Education needs addressed  Clayton Bibles, MS, RD, LDN Pager: [phone] After Hours Pager: [phone]

## 2016-12-09 NOTE — Progress Notes (Signed)
PROGRESS NOTE    Rebekah Henderson  ATF:[id number] DOB: Aug 04, 1956 DOA: 12/07/2016 PCP: Glo Herring., MD   Brief Narrative:  Rebekah Henderson is a 61 y.o. 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 Patient is followed by Dr. Earlie Server. Patient presents with nausea and vomiting and diarrhea that started today while she was on her way to [place] to receive Neulasta injection. Within a few minutes, the patient became nauseated and started vomiting and began to have diarrhea. She returned to home and called EMS to have her bring her to the hospital. She does report increasing cough over the past 48 hours that is starting to become productive with yellow sputum. She reported that her fever started last night. She does report body aches and chills. Was worked up and admitted for N/V/D as well as respiratory illness and found to have Influenza A.   Assessment & Plan:   Principal Problem:   SIRS (systemic inflammatory response syndrome) (HCC) Active Problems:   Diarrhea   COPD (chronic obstructive pulmonary disease) (HCC)   Small cell lung cancer, left (HCC)   Intractable nausea and vomiting   Hypoxia   Bronchitis  Systemic Inflammatory Response Syndrome from Influenza A -Patient Flu A Positive; Had Mild Temperature of 100.9, Was Tachycardic at 129 and Tachypenic at 37 -IV Abx with Vancomycin D/C'd -S/p Bolus of 1.5 L in ED -Supportive Care with IVF Rehydration with NS at 75 mL/hr -Oseltamivir 75 mg po BID x 5 days -PT Consult for Generalized Weakness -CXR Negative for Pneumonia and Strep Pneumo Urine Ag -UA was Negative -Blood Cx x2 Negative at 2 days  Diarrhea possibly from Chemotherapy vs Viral Gastroenteritis -Patient states she has had no reoccurrence; No Abdominal Pain  -C/w Loperamide 4 mg po prn Diarrhea/Loose Stools; Had 2 episodes this AM -Continue to Monitor and if  Increased Stools will Check Stool Panel/C.Difficile  Lactic Acidosis -Patient's Lactic Acid peaked at 2.4 -> Improved to 1.6 -C/w IVF Rehydration with NS at 125 mL/hr  Hypokalemia suspect from GI Losses from Diarrhea, improving -Patient's K+ went from 3.7 -> 2.8 -> 3.4 -Replete with K-Dur 40 mEQ po BID -Repeat CMP in AM  Acute on Chronic Hypoxic Respiratory Failure likely from COPD and Influenza A and Small Cell Lung Cancer -C/w Supplemental O2 via [place] -Wean O2 as tolerated -C/w Guaifenesin 600 mg po BID -Increased DuoNeb 3 mL BID to TID and C/w with Albuterol Neb q2hprn  Leukopenia/Neutropenia -WBC dropping from 14.5 -> 12.2 -> 5.3 -> 4.4 -> 2.9 -Likely from Chemotherapy -Continue to Monitor closely for Neutropenic Fever -May need Neupogen or Granix  Hypomagnesemia -Patient's Mag Level was 1.4 -Replete with IV Mag Sulfate and continue Mag Oxide 400 mg TID -Repeat Mag Level in AM  COPD/Bronchitis -C/w Guaifenesin 600 mg po BID -Increased DuoNeb 3 mL BID to TID and C/w with Albuterol Neb q2hprn -Started Methylprednisolone IV 40 q12h  Nausea and Vomiting -C/w Zofran 4 mg po q6hprn po/IV -Supportive Care with IVF at 125 mL/hr   Small Cell Lung Cancer on Chemotherapy and XRT -Pain Control with Acetaminophen Hydrocodone-Acetaminophen 1 tab po q4hprn for Moderate Pain; Received Dilaudid 0.5 mg and 1 mg IV yesterday -Oncology Consulted by EDP; Notified Dr. Julien Nordmann over the phone  Hypothyroidism -Check TSH and Free T4 -C/w Levothyroxine 25 mcg po Daily  Hypertension -C/w Amlodipine and Metoprolol Succinate 25 mg po Daily -Hold Triamterene-HCTZ  37.5-25 mg po Daily as BP's were on lower side -Continue to Monitor BP's Carefully  GERD -C/w Pantoprazole 20 mg po Daily  Anxiety/Fibromyalgia -C/w Fluoxetine 40 mg po Daily and with Temazepam 30 mg po qHS  Anemia 2/2 to Chemotherapy -Patient's Hb/Hct went from 9.6/28.6 -> 8.3/24.8 -> 8.1/24.0 -Repeat CBC in AM and monitor for  S/Sx of Bleeding -Appreciate Oncology Recc's  DVT prophylaxis: Lovenox 40 mg sq Code Status: FULL CODE Family Communication: No Family present at bedside Disposition Plan: Remain Inpatient   Consultants:   Oncology Dr. Julien Nordmann   Procedures: None   Antimicrobials: IV Abx D/C'd; C/w Oseltamivir 75 mg po BID x 5 days  Subjective: Seen and examined at bedside and was feeling a little better but still feeling SOB. No more N/V but still having some diarrhea but thinks its improving. No headaches or blurred vision or double vision. No other concerns or complaints at this time.   Objective: Vitals:   12/09/16 0510 12/09/16 0718 12/09/16 0928 12/09/16 1354  BP: 115/63  122/69 (!) 90/59  Pulse: 86 83 (!) 104 85  Resp: '16 18  18  '$ Temp: 98.1 F (36.7 C)   97.9 F (36.6 C)  TempSrc: Oral   Oral  SpO2: 99%   99%  Weight:      Height:        Intake/Output Summary (Last 24 hours) at 12/09/16 1741 Last data filed at 12/09/16 1416  Gross per 24 hour  Intake          2838.33 ml  Output                0 ml  Net          2838.33 ml   Filed Weights   12/07/16 2224  Weight: 79.8 kg (176 lb)   Examination: Physical Exam:  Constitutional: WN/WD, NAD and appears calm and comfortable Eyes: Lids and conjunctivae normal, sclerae anicteric  ENMT: External Ears, Nose appear normal. Grossly normal hearing.  Neck: Appears normal, supple, no cervical masses, normal ROM, no appreciable thyromegaly Respiratory: Diminished with some wheezing on Left worse than Right and rhonchous breath sounds; No rales or crackles. Normal respiratory effort and patient is not tachypenic. No accessory muscle use but patient is using supplemental O2 via [place].   Cardiovascular: RRR, no murmurs / rubs / gallops. S1 and S2 auscultated. No extremity edema.  Abdomen: Soft, non-tender, non-distended. No masses palpated. No appreciable hepatosplenomegaly. Bowel sounds positive x4.  GU: Deferred. Musculoskeletal: No  clubbing / cyanosis of digits/nails. No joint deformity upper and lower extremities.  Skin: No rashes, lesions, ulcers. No induration; Warm and dry.  Neurologic: CN 2-12 grossly intact with no focal deficits. Sensation intact in all 4 Extremities. Romberg sign cerebellar reflexes not assessed.  Psychiatric: Normal judgment and insight. Alert and oriented x 3. Normal mood and appropriate affect.   Data Reviewed: I have personally reviewed following labs and imaging studies  CBC:  Recent Labs Lab 12/03/16 1028 12/07/16 1352 12/08/16 0402 12/09/16 0418  WBC 12.2* 5.3 4.4 2.9*  NEUTROABS 9.7* 4.9  --  2.2  HGB 10.8* 9.6* 8.3* 8.1*  HCT 32.1* 28.6* 24.8* 24.0*  MCV 88.3 90.2 85.8 86.0  PLT 604* 478* 411* 458   Basic Metabolic Panel:  Recent Labs Lab 12/03/16 1028 12/07/16 1352 12/08/16 0402 12/09/16 0418  NA 138 134* 135 135  K 3.8 3.7 2.8* 3.4*  CL  --  97* 100* 101  CO2 '24 27 26 '$ 25  GLUCOSE 96 106* 95 93  BUN 10.'2 13 9 9  '$ CREATININE 0.8 0.76 0.68 0.53  CALCIUM 10.0 8.8* 8.0* 8.3*  MG 1.9  --   --  1.4*  PHOS  --   --   --  3.0   GFR: Estimated Creatinine Clearance: 74.5 mL/min (by C-G formula based on SCr of 0.53 mg/dL). Liver Function Tests:  Recent Labs Lab 12/03/16 1028 12/07/16 1352 12/09/16 0418  AST 10 27 14*  ALT 8 16 12*  ALKPHOS 62 43 36*  BILITOT 0.56 0.8 0.4  PROT 7.7 6.9 6.0*  ALBUMIN 3.4* 3.4* 2.9*    Recent Labs Lab 12/07/16 1352  LIPASE 22   No results for input(s): AMMONIA in the last 168 hours. Coagulation Profile:  Recent Labs Lab 12/07/16 1352  INR 0.98   Cardiac Enzymes: No results for input(s): CKTOTAL, CKMB, CKMBINDEX, TROPONINI in the last 168 hours. BNP (last 3 results) No results for input(s): PROBNP in the last 8760 hours. HbA1C: No results for input(s): HGBA1C in the last 72 hours. CBG: No results for input(s): GLUCAP in the last 168 hours. Lipid Profile: No results for input(s): CHOL, HDL, LDLCALC, TRIG, CHOLHDL,  LDLDIRECT in the last 72 hours. Thyroid Function Tests: No results for input(s): TSH, T4TOTAL, FREET4, T3FREE, THYROIDAB in the last 72 hours. Anemia Panel: No results for input(s): VITAMINB12, FOLATE, FERRITIN, TIBC, IRON, RETICCTPCT in the last 72 hours. Sepsis Labs:  Recent Labs Lab 12/07/16 1352 12/07/16 2048 12/08/16 0904 12/08/16 1224  LATICACIDVEN 1.7 2.3* 2.4* 1.6    Recent Results (from the past 240 hour(s))  Culture, blood (Routine X 2) w Reflex to ID Panel     Status: None (Preliminary result)   Collection Time: 12/07/16  1:52 PM  Result Value Ref Range Status   Specimen Description LEFT ANTECUBITAL  Final   Special Requests BOTTLES DRAWN AEROBIC AND ANAEROBIC 4CC EACH  Final   Culture NO GROWTH 2 DAYS  Final   Report Status PENDING  Incomplete  Culture, blood (Routine X 2) w Reflex to ID Panel     Status: None (Preliminary result)   Collection Time: 12/07/16  3:22 PM  Result Value Ref Range Status   Specimen Description SITE NOT SPECIFIED  Final   Special Requests BOTTLES DRAWN AEROBIC AND ANAEROBIC [place]  Final   Culture NO GROWTH 2 DAYS  Final   Report Status PENDING  Incomplete    Radiology Studies: No results found. Scheduled Meds: . amLODipine  5 mg Oral Daily  . aspirin EC  81 mg Oral QODAY  . enoxaparin (LOVENOX) injection  40 mg Subcutaneous Q24H  . estradiol  2 mg Oral Daily  . FLUoxetine  40 mg Oral Daily  . guaiFENesin  600 mg Oral BID  . ipratropium-albuterol  3 mL Nebulization TID  . levothyroxine  25 mcg Oral Daily  . magnesium oxide  400 mg Oral TID  . metoprolol succinate  25 mg Oral Daily  . oseltamivir  75 mg Oral BID  . pantoprazole  20 mg Oral Daily  . potassium chloride  40 mEq Oral BID  . temazepam  30 mg Oral QHS   Continuous Infusions: . sodium chloride 1,000 mL (12/09/16 1510)    LOS: 2 days   Kerney Elbe, DO [facility] Pager [phone]  If 7PM-7AM, please contact  night-coverage [email or web address] Password [facility] 12/09/2016, 5:41 PM

## 2016-12-10 ENCOUNTER — Encounter (HOSPITAL_COMMUNITY): Payer: Self-pay | Admitting: *Deleted

## 2016-12-10 ENCOUNTER — Other Ambulatory Visit: Payer: Medicare Other

## 2016-12-10 DIAGNOSIS — J101 Influenza due to other identified influenza virus with other respiratory manifestations: Secondary | ICD-10-CM

## 2016-12-10 LAB — PHOSPHORUS: Phosphorus: 2.5 mg/dL (ref 2.5–4.6)

## 2016-12-10 LAB — TSH: TSH: 1.001 u[IU]/mL (ref 0.350–4.500)

## 2016-12-10 LAB — CBC WITH DIFFERENTIAL/PLATELET
BASOS ABS: 0 10*3/uL (ref 0.0–0.1)
Basophils Relative: 0 %
EOS PCT: 0 %
Eosinophils Absolute: 0 10*3/uL (ref 0.0–0.7)
HEMATOCRIT: 25.9 % — AB (ref 36.0–46.0)
HEMOGLOBIN: 8.8 g/dL — AB (ref 12.0–15.0)
LYMPHS ABS: 0.1 10*3/uL — AB (ref 0.7–4.0)
LYMPHS PCT: 5 %
MCH: 29.5 pg (ref 26.0–34.0)
MCHC: 34 g/dL (ref 30.0–36.0)
MCV: 86.9 fL (ref 78.0–100.0)
MONOS PCT: 0 %
Monocytes Absolute: 0 10*3/uL — ABNORMAL LOW (ref 0.1–1.0)
NEUTROS PCT: 95 %
Neutro Abs: 2.5 10*3/uL (ref 1.7–7.7)
PLATELETS: 277 10*3/uL (ref 150–400)
RBC: 2.98 MIL/uL — AB (ref 3.87–5.11)
RDW: 16.1 % — ABNORMAL HIGH (ref 11.5–15.5)
WBC: 2.6 10*3/uL — ABNORMAL LOW (ref 4.0–10.5)

## 2016-12-10 LAB — COMPREHENSIVE METABOLIC PANEL
ALK PHOS: 34 U/L — AB (ref 38–126)
ALT: 14 U/L (ref 14–54)
AST: 18 U/L (ref 15–41)
Albumin: 3 g/dL — ABNORMAL LOW (ref 3.5–5.0)
Anion gap: 9 (ref 5–15)
BUN: 10 mg/dL (ref 6–20)
CALCIUM: 8.8 mg/dL — AB (ref 8.9–10.3)
CHLORIDE: 104 mmol/L (ref 101–111)
CO2: 24 mmol/L (ref 22–32)
CREATININE: 0.45 mg/dL (ref 0.44–1.00)
GFR calc Af Amer: >60 mL/min (ref >60)
Glucose, Bld: 148 mg/dL — ABNORMAL HIGH (ref 65–99)
Potassium: 4.8 mmol/L (ref 3.5–5.1)
Sodium: 137 mmol/L (ref 135–145)
Total Bilirubin: 0.7 mg/dL (ref 0.3–1.2)
Total Protein: 6.5 g/dL (ref 6.5–8.1)

## 2016-12-10 LAB — T4, FREE: Free T4: 0.97 ng/dL (ref 0.61–1.12)

## 2016-12-10 LAB — PREPARE RBC (CROSSMATCH)

## 2016-12-10 LAB — MAGNESIUM: MAGNESIUM: 1.6 mg/dL — AB (ref 1.7–2.4)

## 2016-12-10 LAB — ABO/RH: ABO/RH(D): O POS

## 2016-12-10 MED ORDER — SODIUM CHLORIDE 0.9 % IV SOLN: Freq: Once | INTRAVENOUS | Status: DC

## 2016-12-10 MED ORDER — MAGNESIUM SULFATE 2 GM/50ML IV SOLN
2.0000 g | Freq: Once | INTRAVENOUS | Status: AC
  Administered 2016-12-10: 2 g via INTRAVENOUS
  Filled 2016-12-10: qty 50

## 2016-12-10 MED ORDER — FUROSEMIDE 10 MG/ML IJ SOLN
20.0000 mg | Freq: Once | INTRAMUSCULAR | Status: AC
  Administered 2016-12-10: 20 mg via INTRAVENOUS
  Filled 2016-12-10: qty 2

## 2016-12-10 NOTE — Progress Notes (Addendum)
PROGRESS NOTE    Rebekah Henderson  YJE:[id number] DOB: [birth year] DOA: 12/07/2016 PCP: Glo Herring., MD   Brief Narrative:  Rebekah Henderson is a 61 y.o. 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 Patient is followed by Dr. Earlie Server. Patient presents with nausea and vomiting and diarrhea that started today while she was on her way to [place] to receive Neulasta injection. Within a few minutes, the patient became nauseated and started vomiting and began to have diarrhea. She returned to home and called EMS to have her bring her to the hospital. She does report increasing cough over the past 48 hours that is starting to become productive with yellow sputum. She reported that her fever started last night. She does report body aches and chills. Was worked up and admitted for N/V/D as well as respiratory illness and found to have Influenza A.   Assessment & Plan:   Principal Problem:   SIRS (systemic inflammatory response syndrome) (HCC) Active Problems:   Diarrhea   COPD (chronic obstructive pulmonary disease) (HCC)   Small cell lung cancer, left (HCC)   Intractable nausea and vomiting   Hypoxia   Bronchitis   Influenza A  Systemic Inflammatory Response Syndrome from Influenza A -Patient Flu A Positive; Had Mild Temperature of 100.9, Was Tachycardic at 129 and Tachypenic at 37 -IV Abx with Vancomycin D/C'd -S/p Bolus of 1.5 L in ED -Supportive Care with IVF Rehydration with NS at 75 mL/hr -Oseltamivir 75 mg po BID x 5 days (had 7 doses out of 10) -PT Consult for Generalized Weakness -CXR Negative for Pneumonia and Strep Pneumo Urine Ag -UA was Negative -Blood Cx x2 Negative at 3 days  Diarrhea possibly from Chemotherapy vs Viral Gastroenteritis, Diarrhea resolved -Patient states she has had no reoccurrence; No Abdominal Pain  -C/w Loperamide 4 mg po prn Diarrhea/Loose Stools;  Had 2 episodes this AM -Continue to Monitor and if Increased Stools will Check Stool Panel/C.Difficile  Lactic Acidosis -Patient's Lactic Acid peaked at 2.4 -> Improved to 1.6 -C/w IVF Rehydration with NS at 75 mL/hr  Hypokalemia suspect from GI Losses from Diarrhea, improved -Patient's K+ went from 3.7 -> 2.8 -> 3.4 -> 4.8 -Discontinued with K-Dur 40 mEQ po BID -Repeat CMP in AM  Acute on Chronic Hypoxic Respiratory Failure likely from COPD and Influenza A and Small Cell Lung Cancer -C/w Supplemental O2 via [place] and Wean O2 as tolerated -C/w Guaifenesin 600 mg po BID -Increased DuoNeb 3 mL BID to TID and C/w with Albuterol Neb q2hprn -6 minute Walk Screen Prior to D/C  Leukopenia/Neutropenia -WBC dropping from 14.5 -> 12.2 -> 5.3 -> 4.4 -> 2.9 -> 2.6 -Likely from Chemotherapy -Continue to Monitor closely for Neutropenic Fever -May need Neupogen or Granix and will consider if ANC <1000 -ANC today was 2500  Hypomagnesemia -Patient's Mag Level was 1.6 -Replete with IV Mag Sulfate and continue Mag Oxide 400 mg TID -Repeat Mag Level in AM  COPD/Bronchitis -C/w Guaifenesin 600 mg po BID -Increased DuoNeb 3 mL BID to TID and C/w with Albuterol Neb q2hprn -C/w Methylprednisolone IV 40 q12h and transition to Pred Taper at D/C  Nausea and Vomiting -C/w Zofran 4 mg po q6hprn po/IV -Supportive Care with IVF at 125 mL/hr   Small Cell Lung Cancer s/p 3 Cycles of Systemic Carboplatin and Etoposide Chemotherapy and Concurrent XRT -Pain Control with Acetaminophen Hydrocodone-Acetaminophen 1 tab po  q4hprn for Moderate Pain; Received Dilaudid 0.5 mg and 1 mg IV yesterday -Oncology Consulted by EDP; Notified Dr. Julien Nordmann over the phone and evaluated patient yesterday -Feels she can resume Concurrent Radiotherapy however patient wants to rest this week and resume next week -Repeat CT Scan of Chest for Restaging of Disease in Less than 3 weeks  Hypothyroidism -Check TSH and Free T4 -C/w  Levothyroxine 25 mcg po Daily  Hypertension -C/w Amlodipine and Metoprolol Succinate 25 mg po Daily -Hold Triamterene-HCTZ 37.5-25 mg po Daily as BP's were on lower side -Continue to Monitor BP's Carefully  GERD -C/w Pantoprazole 20 mg po Daily  Anxiety/Fibromyalgia -C/w Fluoxetine 40 mg po Daily and with Temazepam 30 mg po qHS  Anemia 2/2 to Chemotherapy -Patient's Hb/Hct went from 9.6/28.6 -> 8.3/24.8 -> 8.1/24.0 -> 8.8/25.9 -Spoke to Oncology Dr. Julien Nordmann today and he recommends transfusion of 2 units of irradiated pRBC despite her Hb being 8.8 -Ordered 2 units and will transfused -Repeat CBC in AM and monitor for S/Sx of Bleeding -Appreciated Oncology Recc's  DVT prophylaxis: Lovenox 40 mg sq Code Status: FULL CODE Family Communication: No Family present at bedside Disposition Plan: Remain Inpatient   Consultants:   Oncology Dr. Julien Nordmann   Procedures: None   Antimicrobials: IV Abx D/C'd; C/w Oseltamivir 75 mg po BID x 5 days  Subjective: Seen and examined at bedside stated she felt great and felt stronger and that the diarrhea had stopped. Wanted to know when she could go home. Discussed with her about Dr. Worthy Flank recommendations about blood transfusions and she was agreeable. No other concerns or complaints and breathing is doing tremendously better with no wheezing after IV Steroids.    Objective: Vitals:   12/10/16 1536 12/10/16 1809 12/10/16 1844 12/10/16 1914  BP: 121/69 121/69 132/60 (!) 107/93  Pulse: 78 71 88 82  Resp: '17 16 18 18  '$ Temp: 98.2 F (36.8 C) 98.3 F (36.8 C) 98 F (36.7 C) 97 F (36.1 C)  TempSrc: Oral Oral Oral Oral  SpO2: 94% 96% 98% 94%  Weight:      Height:        Intake/Output Summary (Last 24 hours) at 12/10/16 1924 Last data filed at 12/10/16 1858  Gross per 24 hour  Intake          2056.25 ml  Output                0 ml  Net          2056.25 ml   Filed Weights   12/07/16 2224  Weight: 79.8 kg (176 lb)    Examination: Physical Exam:  Constitutional: WN/WD, NAD and appears calm and comfortable Eyes: Lids and conjunctivae normal, sclerae anicteric  ENMT: External Ears, Nose appear normal. Grossly normal hearing.  Neck: Appears normal, supple, no cervical masses, normal ROM, no appreciable thyromegaly Respiratory: CTAB; No rales or crackles. Normal respiratory effort and patient is not tachypenic. No accessory muscle use. Not wearing O2 via [place].    Cardiovascular: RRR, no murmurs / rubs / gallops. S1 and S2 auscultated. No extremity edema.  Abdomen: Soft, non-tender, non-distended. No masses palpated. No appreciable hepatosplenomegaly. Bowel sounds positive x4.  GU: Deferred. Musculoskeletal: No clubbing / cyanosis of digits/nails. No joint deformity upper and lower extremities.  Skin: No rashes, lesions, ulcers. No induration; Warm and dry.  Neurologic: CN 2-12 grossly intact with no focal deficits. Sensation intact in all 4 Extremities. Romberg sign cerebellar reflexes not assessed.  Psychiatric: Normal judgment and  insight. Alert and oriented x 3. Normal mood and appropriate affect.   Data Reviewed: I have personally reviewed following labs and imaging studies  CBC:  Recent Labs Lab 12/07/16 1352 12/08/16 0402 12/09/16 0418 12/10/16 0439  WBC 5.3 4.4 2.9* 2.6*  NEUTROABS 4.9  --  2.2 2.5  HGB 9.6* 8.3* 8.1* 8.8*  HCT 28.6* 24.8* 24.0* 25.9*  MCV 90.2 85.8 86.0 86.9  PLT 478* 411* 336 349   Basic Metabolic Panel:  Recent Labs Lab 12/07/16 1352 12/08/16 0402 12/09/16 0418 12/10/16 0439  NA 134* 135 135 137  K 3.7 2.8* 3.4* 4.8  CL 97* 100* 101 104  CO2 '27 26 25 24  '$ GLUCOSE 106* 95 93 148*  BUN '13 9 9 10  '$ CREATININE 0.76 0.68 0.53 0.45  CALCIUM 8.8* 8.0* 8.3* 8.8*  MG  --   --  1.4* 1.6*  PHOS  --   --  3.0 2.5   GFR: Estimated Creatinine Clearance: 74.5 mL/min (by C-G formula based on SCr of 0.45 mg/dL). Liver Function Tests:  Recent Labs Lab 12/07/16 1352  12/09/16 0418 12/10/16 0439  AST 27 14* 18  ALT 16 12* 14  ALKPHOS 43 36* 34*  BILITOT 0.8 0.4 0.7  PROT 6.9 6.0* 6.5  ALBUMIN 3.4* 2.9* 3.0*    Recent Labs Lab 12/07/16 1352  LIPASE 22   No results for input(s): AMMONIA in the last 168 hours. Coagulation Profile:  Recent Labs Lab 12/07/16 1352  INR 0.98   Cardiac Enzymes: No results for input(s): CKTOTAL, CKMB, CKMBINDEX, TROPONINI in the last 168 hours. BNP (last 3 results) No results for input(s): PROBNP in the last 8760 hours. HbA1C: No results for input(s): HGBA1C in the last 72 hours. CBG: No results for input(s): GLUCAP in the last 168 hours. Lipid Profile: No results for input(s): CHOL, HDL, LDLCALC, TRIG, CHOLHDL, LDLDIRECT in the last 72 hours. Thyroid Function Tests:  Recent Labs  12/10/16 0439  TSH 1.001  FREET4 0.97   Anemia Panel: No results for input(s): VITAMINB12, FOLATE, FERRITIN, TIBC, IRON, RETICCTPCT in the last 72 hours. Sepsis Labs:  Recent Labs Lab 12/07/16 1352 12/07/16 2048 12/08/16 0904 12/08/16 1224  LATICACIDVEN 1.7 2.3* 2.4* 1.6    Recent Results (from the past 240 hour(s))  Culture, blood (Routine X 2) w Reflex to ID Panel     Status: None (Preliminary result)   Collection Time: 12/07/16  1:52 PM  Result Value Ref Range Status   Specimen Description LEFT ANTECUBITAL  Final   Special Requests BOTTLES DRAWN AEROBIC AND ANAEROBIC 4CC EACH  Final   Culture NO GROWTH 3 DAYS  Final   Report Status PENDING  Incomplete  Culture, blood (Routine X 2) w Reflex to ID Panel     Status: None (Preliminary result)   Collection Time: 12/07/16  3:22 PM  Result Value Ref Range Status   Specimen Description SITE NOT SPECIFIED  Final   Special Requests BOTTLES DRAWN AEROBIC AND ANAEROBIC [place]  Final   Culture NO GROWTH 3 DAYS  Final   Report Status PENDING  Incomplete    Radiology Studies: No results found. Scheduled Meds: . sodium chloride   Intravenous Once  . amLODipine  5 mg  Oral Daily  . aspirin EC  81 mg Oral QODAY  . enoxaparin (LOVENOX) injection  40 mg Subcutaneous Q24H  . estradiol  2 mg Oral Daily  . FLUoxetine  40 mg Oral Daily  . guaiFENesin  600 mg Oral BID  .  ipratropium-albuterol  3 mL Nebulization TID  . levothyroxine  25 mcg Oral Daily  . magnesium oxide  400 mg Oral TID  . methylPREDNISolone (SOLU-MEDROL) injection  40 mg Intravenous Q12H  . metoprolol succinate  25 mg Oral Daily  . oseltamivir  75 mg Oral BID  . pantoprazole  20 mg Oral Daily  . potassium chloride  40 mEq Oral BID  . temazepam  30 mg Oral QHS   Continuous Infusions: . sodium chloride 75 mL/hr at 12/10/16 0300    LOS: 3 days   Kerney Elbe, DO [facility] Pager [phone]  If 7PM-7AM, please contact night-coverage [email or web address] Password [facility] 12/10/2016, 7:24 PM

## 2016-12-10 NOTE — Progress Notes (Signed)
   12/10/16 1100  Clinical Encounter Type  Visited With Patient  Visit Type Psychological support  Counselor has been supporting patient in the [place] and visited today to offer continued support. Patient was alert, oriented, and in very good spirits. She sat up in bed and engaged clearly and enthusiasitcally with counselor throughout the visit. Patient reported that she felt much better and was hopeful about going home today. She indicated that she had relied on her faith and her confidence in her physician to get her through this "bump in the road." Patient discussed appreciation for the people in her support network who will provide for her needs while also giving her time alone to rest and recover. Counselor will follow up with patient during her next treatment in the [place].  Lamount Cohen, Counseling Intern Department for Spiritual Care and [facility] Supervisor - Scientist, research (physical sciences)

## 2016-12-11 DIAGNOSIS — R6510 Systemic inflammatory response syndrome (SIRS) of non-infectious origin without acute organ dysfunction: Secondary | ICD-10-CM

## 2016-12-11 DIAGNOSIS — J40 Bronchitis, not specified as acute or chronic: Secondary | ICD-10-CM

## 2016-12-11 DIAGNOSIS — R197 Diarrhea, unspecified: Secondary | ICD-10-CM

## 2016-12-11 DIAGNOSIS — R0902 Hypoxemia: Secondary | ICD-10-CM

## 2016-12-11 DIAGNOSIS — J101 Influenza due to other identified influenza virus with other respiratory manifestations: Secondary | ICD-10-CM

## 2016-12-11 DIAGNOSIS — C3492 Malignant neoplasm of unspecified part of left bronchus or lung: Secondary | ICD-10-CM

## 2016-12-11 LAB — CBC WITH DIFFERENTIAL/PLATELET
Basophils Absolute: 0 10*3/uL (ref 0.0–0.1)
Basophils Relative: 0 %
EOS ABS: 0 10*3/uL (ref 0.0–0.7)
Eosinophils Relative: 0 %
HCT: 33 % — ABNORMAL LOW (ref 36.0–46.0)
HEMOGLOBIN: 11.1 g/dL — AB (ref 12.0–15.0)
LYMPHS ABS: 0.5 10*3/uL — AB (ref 0.7–4.0)
LYMPHS PCT: 8 %
MCH: 28.2 pg (ref 26.0–34.0)
MCHC: 33.6 g/dL (ref 30.0–36.0)
MCV: 83.8 fL (ref 78.0–100.0)
Monocytes Absolute: 0.1 10*3/uL (ref 0.1–1.0)
Monocytes Relative: 2 %
NEUTROS PCT: 90 %
Neutro Abs: 5.4 10*3/uL (ref 1.7–7.7)
Platelets: 266 10*3/uL (ref 150–400)
RBC: 3.94 MIL/uL (ref 3.87–5.11)
RDW: 16.7 % — ABNORMAL HIGH (ref 11.5–15.5)
WBC: 6 10*3/uL (ref 4.0–10.5)

## 2016-12-11 LAB — TYPE AND SCREEN
BLOOD PRODUCT EXPIRATION DATE: 201801242359
Blood Product Expiration Date: 201801242359
ISSUE DATE / TIME: 201801091453
ISSUE DATE / TIME: 201801091831
UNIT TYPE AND RH: 5100
Unit Type and Rh: 5100

## 2016-12-11 LAB — COMPREHENSIVE METABOLIC PANEL
ALK PHOS: 45 U/L (ref 38–126)
ALT: 13 U/L — AB (ref 14–54)
AST: 18 U/L (ref 15–41)
Albumin: 3.7 g/dL (ref 3.5–5.0)
Anion gap: 9 (ref 5–15)
BUN: 17 mg/dL (ref 6–20)
CALCIUM: 9.6 mg/dL (ref 8.9–10.3)
CO2: 28 mmol/L (ref 22–32)
CREATININE: 0.5 mg/dL (ref 0.44–1.00)
Chloride: 101 mmol/L (ref 101–111)
GFR calc non Af Amer: >60 mL/min (ref >60)
GLUCOSE: 121 mg/dL — AB (ref 65–99)
Potassium: 4.5 mmol/L (ref 3.5–5.1)
SODIUM: 138 mmol/L (ref 135–145)
Total Bilirubin: 1 mg/dL (ref 0.3–1.2)
Total Protein: 7.6 g/dL (ref 6.5–8.1)

## 2016-12-11 LAB — MAGNESIUM: Magnesium: 1.8 mg/dL (ref 1.7–2.4)

## 2016-12-11 LAB — PHOSPHORUS: Phosphorus: 3.9 mg/dL (ref 2.5–4.6)

## 2016-12-11 MED ORDER — OSELTAMIVIR PHOSPHATE 75 MG PO CAPS: 75.0000 mg | ORAL_CAPSULE | Freq: Two times a day (BID) | ORAL | 0 refills | Status: AC

## 2016-12-11 MED ORDER — PREDNISONE 10 MG (21) PO TBPK: 10.0000 mg | ORAL_TABLET | Freq: Every day | ORAL | 0 refills | Status: DC

## 2016-12-11 MED ORDER — BENZONATATE 100 MG PO CAPS: 100.0000 mg | ORAL_CAPSULE | Freq: Three times a day (TID) | ORAL | 0 refills | Status: DC | PRN

## 2016-12-11 MED ORDER — GUAIFENESIN ER 600 MG PO TB12: 600.0000 mg | ORAL_TABLET | Freq: Two times a day (BID) | ORAL | 0 refills | Status: DC | PRN

## 2016-12-11 NOTE — Progress Notes (Signed)
Patient d/c home. Stable. Had rendered d/c instructions before patient went home. Prescriptions given, verbalized understanding.In good spirits.

## 2016-12-11 NOTE — Progress Notes (Signed)
PT SCREEN  Pt ambulating in halls unassisted and with no balance loss.  Pt states at baseline and eager for return home.  PT service to sign off at this time.  Pt in agreement and RN aware.

## 2016-12-11 NOTE — Discharge Summary (Signed)
Physician Discharge Summary  TAREA SKILLMAN IHK:[id number] DOB: [birth year] DOA: 12/07/2016  PCP: Glo Herring., MD  Admit date: 12/07/2016 Discharge date: 12/11/2016  Admitted From: Home Disposition:  Home  Recommendations for Outpatient Follow-up:  1. Follow up with PCP in 1-2 weeks 2. Resume chemotherapy/XRT per oncology recommendations  Discharge Condition: Stable CODE STATUS: Full code   Brief/Interim Summary:  HPI written by Loma Boston, DO on 12/07/2016.   Chief Complaint: Fever, diarrhea, nausea and vomiting.  HPI: Rebekah Henderson is a 61 y.o. 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 Patient is followed by Dr. Earlie Server. Patient presents with nausea and vomiting and diarrhea that started today while she was on her way to [place] to receive Neulasta injection. Within a few minutes, the patient became nauseated and started vomiting and began to have diarrhea. She returned to home and called EMS to have her bring her to the hospital. She does report increasing cough over the past 48 hours that is starting to become productive with yellow sputum. She reported that her fever started last night. She does report body aches and chills.  Hospital course:  SIRS Secondary to influenza A. Patient was fluid resuscitated and initially started on empiric antibiotics. Upon result of influenza positive and negative blood cultures, antibiotics were discontinued. Tamiflu started for influenza treatment.  Influenza A infection Treated with Tamiflu. Patient symptoms improved.  Diarrhea Secondary to chemotherapy versus viral gastroenteritis. Resolved during admission.  Lactic acidosis Improved with rehydration  Hypokalemia Likely secondary to GI losses from diarrhea. Repletion with by mouth potassium. Improved before discharge  Acute on chronic hypoxic respiratory failure Patient  provided supplemental O2 as needed and weaned to room air. She received DuoNeb nebulizer and albuterol.  Leukopenia Likely secondary to chemotherapy. Afebrile. No neutropenia.  Hypomagnesemia Repleted  COPD Likely component in patients lung pathology in addition to influenza infection. Patient given IV steroids and transition to oral steroids on discharge. She was given nebulizer treatments as above.  Small cell lung cancer Patient is receiving chemotherapy and x-ray therapy. Continue as outpatient.  Hypothyroidism Continued Synthroid  Hypertension Continue amlodipine and metoprolol. Triamterene-HCTZ was held on admission but resumed on discharge  GERD Continue proton pump inhibitor  Anxiety Fibromyalgia Continued fluoxetine 40 mg and temazepam 30 mg  Anemia Secondary to chemotherapy. Patient received 2 units of packed red blood cells for a hemoglobin of 8.8. Status post transfusion, patient's hemoglobin improved to 11.1. Patient noticed significant improvement.  Discharge Diagnoses:  Principal Problem:   SIRS (systemic inflammatory response syndrome) (HCC) Active Problems:   Diarrhea   COPD (chronic obstructive pulmonary disease) (HCC)   Small cell lung cancer, left (HCC)   Intractable nausea and vomiting   Hypoxia   Bronchitis   Influenza A    Discharge Instructions  Discharge Instructions    Increase activity slowly    Complete by:  As directed      Allergies as of 12/11/2016      Reactions   Penicillins Hives, Swelling   Has patient had a PCN reaction causing immediate rash, facial/tongue/throat swelling, SOB or lightheadedness with hypotension:unsure  Has patient had a PCN reaction causing severe rash involving mucus membranes or skin necrosis:unsure Has patient had a PCN reaction that required hospitalization:No Has patient had a PCN reaction occurring within the last 10 years:No If all of the above answers are "NO", then may proceed with Cephalosporin  use.   Codeine Nausea Only   Ranitidine Hcl Other (See Comments)   dizzy   Keflex [cephalexin] Other (See Comments)   UNSPECIFIED REACTION    Lyrica [pregabalin] Other (See Comments)   lethargic      Medication List    STOP taking these medications   bacitracin ointment   prochlorperazine 10 MG tablet Commonly known as:  COMPAZINE     TAKE these medications   albuterol 2 MG tablet Commonly known as:  PROVENTIL Take 2 mg by mouth daily as needed for wheezing or shortness of breath.   PROAIR HFA 108 (90 Base) MCG/ACT inhaler Generic drug:  albuterol Inhale 1-2 puffs into the lungs every 4 (four) hours as needed.   amLODipine 5 MG tablet Commonly known as:  NORVASC Take 5 mg by mouth daily.   aspirin EC 81 MG tablet Take 81 mg by mouth every other day.   benzonatate 100 MG capsule Commonly known as:  TESSALON Take 1 capsule (100 mg total) by mouth 3 (three) times daily as needed for cough.   estazolam 2 MG tablet Commonly known as:  PROSOM Take 2 mg by mouth at bedtime.   estradiol 2 MG tablet Commonly known as:  ESTRACE Take 2 mg by mouth daily.   FLUoxetine 40 MG capsule Commonly known as:  PROZAC Take 40 mg by mouth daily.   guaiFENesin 600 MG 12 hr tablet Commonly known as:  MUCINEX Take 1 tablet (600 mg total) by mouth 2 (two) times daily as needed.   HYDROcodone-acetaminophen 5-325 MG tablet Commonly known as:  NORCO/VICODIN Take 1 tablet by mouth every 4 (four) hours as needed for moderate pain.   levothyroxine 25 MCG tablet Commonly known as:  SYNTHROID, LEVOTHROID Take 25 mcg by mouth daily.   loperamide 2 MG capsule Commonly known as:  IMODIUM Take 2 mg by mouth every 2 (two) hours as needed for diarrhea or loose stools.   magnesium oxide 400 (241.3 Mg) MG tablet Commonly known as:  MAG-OX Take 1 tablet (400 mg total) by mouth 3 (three) times daily.   metoprolol succinate 25 MG 24 hr tablet Commonly known as:  TOPROL-XL Take 25 mg by  mouth daily.   multivitamin with minerals Tabs tablet Take 2 tablets by mouth daily.   ondansetron 8 MG tablet Commonly known as:  ZOFRAN Take 1 tablet (8 mg total) by mouth 3 (three) times daily as needed for nausea or vomiting.   oseltamivir 75 MG capsule Commonly known as:  TAMIFLU Take 1 capsule (75 mg total) by mouth 2 (two) times daily. Take one dose 1/10 and 1/11   pantoprazole 40 MG tablet Commonly known as:  PROTONIX Take 10 mg by mouth daily.   predniSONE 10 MG (21) Tbpk tablet Commonly known as:  STERAPRED UNI-PAK 21 TAB Take 1 tablet (10 mg total) by mouth daily. Day 1: Two tabs before breakfast, one after lunch, one after dinner, two at bedtime Day 2: One tab before breakfast, one after lunch, one after dinner, two at bedtime Day 3: One tab before breakfast, one after lunch, one after dinner, one at bedtime Day 4: One tab before breakfast, one after lunch, one at bedtime Day 5: One tab before breakfast and at bedtime Day 6: One tab before breakfast   triamterene-hydrochlorothiazide 37.5-25 MG capsule Commonly known as:  DYAZIDE Take 1 capsule by mouth every morning.      Follow-up Information    Glo Herring., MD. Schedule an appointment as soon  as possible for a visit in 2 week(s).   Specialty:  Internal Medicine Contact information: [address] [place] [address] [phone]        Eilleen Kempf., MD. Daphane Shepherd on 12/23/2016.   Specialty:  Oncology Contact information: [place] [address] [phone]          Allergies  Allergen Reactions  . Penicillins Hives and Swelling    Has patient had a PCN reaction causing immediate rash, facial/tongue/throat swelling, SOB or lightheadedness with hypotension:unsure  Has patient had a PCN reaction causing severe rash involving mucus membranes or skin necrosis:unsure Has patient had a PCN reaction that required hospitalization:No Has patient had a PCN reaction occurring within  the last 10 years:No If all of the above answers are "NO", then may proceed with Cephalosporin use.   . Codeine Nausea Only  . Ranitidine Hcl Other (See Comments)    dizzy  . Keflex [Cephalexin] Other (See Comments)    UNSPECIFIED REACTION   . Lyrica [Pregabalin] Other (See Comments)    lethargic    Consultations:  Heme/onc   Procedures/Studies: Dg Chest Port 1 View  Result Date: 12/07/2016 CLINICAL DATA:  Shortness of Breath EXAM: PORTABLE CHEST 1 VIEW COMPARISON:  10/19/2016 FINDINGS: The heart size and mediastinal contours are within normal limits. Both lungs are clear. The visualized skeletal structures are unremarkable. Postsurgical changes cervical spine is noted. IMPRESSION: No active disease. Electronically Signed   By: Inez Catalina M.D.   On: 12/07/2016 13:38     Subjective: Wheezing overnight. None today. Feels much better. No concerns this morning.  Discharge Exam: Vitals:   12/11/16 0610 12/11/16 0954  BP: 127/66 122/83  Pulse: 64 70  Resp: 18   Temp: 97.6 F (36.4 C)    Vitals:   12/10/16 2146 12/11/16 0610 12/11/16 0944 12/11/16 0954  BP: 118/65 127/66  122/83  Pulse: 86 64  70  Resp: 18 18    Temp: 97.8 F (36.6 C) 97.6 F (36.4 C)    TempSrc: Oral Oral    SpO2: 99% 98% 93%   Weight:      Height:        General: Pt is alert, awake, not in acute distress Cardiovascular: RRR, S1/S2 +, no rubs, no gallops Respiratory: CTA bilaterally, no wheezing, no rhonchi Abdominal: Soft, NT, ND, bowel sounds + Extremities: no edema, no cyanosis    The results of significant diagnostics from this hospitalization (including imaging, microbiology, ancillary and laboratory) are listed below for reference.     Microbiology: Recent Results (from the past 240 hour(s))  Culture, blood (Routine X 2) w Reflex to ID Panel     Status: None (Preliminary result)   Collection Time: 12/07/16  1:52 PM  Result Value Ref Range Status   Specimen Description LEFT  ANTECUBITAL  Final   Special Requests BOTTLES DRAWN AEROBIC AND ANAEROBIC 4CC EACH  Final   Culture NO GROWTH 4 DAYS  Final   Report Status PENDING  Incomplete  Culture, blood (Routine X 2) w Reflex to ID Panel     Status: None (Preliminary result)   Collection Time: 12/07/16  3:22 PM  Result Value Ref Range Status   Specimen Description SITE NOT SPECIFIED  Final   Special Requests BOTTLES DRAWN AEROBIC AND ANAEROBIC [place]  Final   Culture NO GROWTH 4 DAYS  Final   Report Status PENDING  Incomplete     Labs:  Basic Metabolic Panel:  Recent Labs Lab 12/07/16  1352 12/08/16 0402 12/09/16 0418 12/10/16 0439 12/11/16 0415  NA 134* 135 135 137 138  K 3.7 2.8* 3.4* 4.8 4.5  CL 97* 100* 101 104 101  CO2 '27 26 25 24 28  '$ GLUCOSE 106* 95 93 148* 121*  BUN '13 9 9 10 17  '$ CREATININE 0.76 0.68 0.53 0.45 0.50  CALCIUM 8.8* 8.0* 8.3* 8.8* 9.6  MG  --   --  1.4* 1.6* 1.8  PHOS  --   --  3.0 2.5 3.9   Liver Function Tests:  Recent Labs Lab 12/07/16 1352 12/09/16 0418 12/10/16 0439 12/11/16 0415  AST 27 14* 18 18  ALT 16 12* 14 13*  ALKPHOS 43 36* 34* 45  BILITOT 0.8 0.4 0.7 1.0  PROT 6.9 6.0* 6.5 7.6  ALBUMIN 3.4* 2.9* 3.0* 3.7    Recent Labs Lab 12/07/16 1352  LIPASE 22   CBC:  Recent Labs Lab 12/07/16 1352 12/08/16 0402 12/09/16 0418 12/10/16 0439 12/11/16 0415  WBC 5.3 4.4 2.9* 2.6* 6.0  NEUTROABS 4.9  --  2.2 2.5 5.4  HGB 9.6* 8.3* 8.1* 8.8* 11.1*  HCT 28.6* 24.8* 24.0* 25.9* 33.0*  MCV 90.2 85.8 86.0 86.9 83.8  PLT 478* 411* 336 277 266   Thyroid function studies  Recent Labs  12/10/16 0439  TSH 1.001   Urinalysis    Component Value Date/Time   COLORURINE YELLOW 12/07/2016 1620   APPEARANCEUR CLEAR 12/07/2016 1620   LABSPEC 1.016 12/07/2016 1620   PHURINE 6.0 12/07/2016 1620   GLUCOSEU NEGATIVE 12/07/2016 1620   HGBUR NEGATIVE 12/07/2016 1620   BILIRUBINUR NEGATIVE 12/07/2016 1620   KETONESUR NEGATIVE 12/07/2016 1620   PROTEINUR NEGATIVE  12/07/2016 1620   UROBILINOGEN 0.2 03/10/2014 1027   NITRITE NEGATIVE 12/07/2016 1620   LEUKOCYTESUR NEGATIVE 12/07/2016 1620   Microbiology Recent Results (from the past 240 hour(s))  Culture, blood (Routine X 2) w Reflex to ID Panel     Status: None (Preliminary result)   Collection Time: 12/07/16  1:52 PM  Result Value Ref Range Status   Specimen Description LEFT ANTECUBITAL  Final   Special Requests BOTTLES DRAWN AEROBIC AND ANAEROBIC 4CC EACH  Final   Culture NO GROWTH 4 DAYS  Final   Report Status PENDING  Incomplete  Culture, blood (Routine X 2) w Reflex to ID Panel     Status: None (Preliminary result)   Collection Time: 12/07/16  3:22 PM  Result Value Ref Range Status   Specimen Description SITE NOT SPECIFIED  Final   Special Requests BOTTLES DRAWN AEROBIC AND ANAEROBIC 6CC EACH  Final   Culture NO GROWTH 4 DAYS  Final   Report Status PENDING  Incomplete     Time coordinating discharge: Over 30 minutes  SIGNED:   Cordelia Poche, MD [facility] 12/11/2016, 1:49 PM Pager [phone]  If 7PM-7AM, please contact night-coverage [email or web address] Password [email or web address]

## 2016-12-11 NOTE — Discharge Instructions (Signed)
Rebekah Henderson, you were treated for flu. You required oxygen to help with oxygen levels, which have improved. You were also low on blood, likely from your chemotherapy. You were given some blood to help with your low blood count, as well. You still have two days left of treatment for your flu. Please continue this as prescribed. You are also being discharged with a steroid taper for your wheezing. Please return if your symptoms worsen. Please follow-up with your primary care physician and Dr. Julien Nordmann.

## 2016-12-12 LAB — CULTURE, BLOOD (ROUTINE X 2)
Culture: NO GROWTH
Culture: NO GROWTH

## 2016-12-16 ENCOUNTER — Telehealth: Payer: Self-pay | Admitting: Medical Oncology

## 2016-12-16 ENCOUNTER — Other Ambulatory Visit: Payer: Medicare Other

## 2016-12-16 NOTE — Telephone Encounter (Signed)
Updated about radiation and unable to contact pt . I gave Rebekah Henderson some other numbers.

## 2016-12-17 ENCOUNTER — Other Ambulatory Visit: Payer: Medicare Other

## 2016-12-20 ENCOUNTER — Telehealth: Payer: Self-pay | Admitting: Medical Oncology

## 2016-12-20 NOTE — Telephone Encounter (Signed)
I Left message for Rebekah Henderson to call and update me on Rebekah Henderson"s condition . Rebekah Henderson has missed several of her xrt appts .

## 2016-12-23 ENCOUNTER — Ambulatory Visit (HOSPITAL_BASED_OUTPATIENT_CLINIC_OR_DEPARTMENT_OTHER): Payer: Medicare Other | Admitting: Internal Medicine

## 2016-12-23 ENCOUNTER — Other Ambulatory Visit (HOSPITAL_BASED_OUTPATIENT_CLINIC_OR_DEPARTMENT_OTHER): Payer: Medicare Other

## 2016-12-23 ENCOUNTER — Encounter: Payer: Self-pay | Admitting: *Deleted

## 2016-12-23 ENCOUNTER — Ambulatory Visit (HOSPITAL_BASED_OUTPATIENT_CLINIC_OR_DEPARTMENT_OTHER): Payer: Medicare Other

## 2016-12-23 ENCOUNTER — Encounter: Payer: Self-pay | Admitting: Internal Medicine

## 2016-12-23 VITALS — BP 149/78 | HR 80 | Temp 97.9°F | Resp 18 | Ht 64.0 in | Wt 175.6 lb

## 2016-12-23 DIAGNOSIS — Z5111 Encounter for antineoplastic chemotherapy: Secondary | ICD-10-CM | POA: Diagnosis not present

## 2016-12-23 DIAGNOSIS — C3432 Malignant neoplasm of lower lobe, left bronchus or lung: Secondary | ICD-10-CM | POA: Diagnosis not present

## 2016-12-23 DIAGNOSIS — J449 Chronic obstructive pulmonary disease, unspecified: Secondary | ICD-10-CM | POA: Diagnosis not present

## 2016-12-23 DIAGNOSIS — C3492 Malignant neoplasm of unspecified part of left bronchus or lung: Secondary | ICD-10-CM

## 2016-12-23 DIAGNOSIS — R21 Rash and other nonspecific skin eruption: Secondary | ICD-10-CM

## 2016-12-23 DIAGNOSIS — I10 Essential (primary) hypertension: Secondary | ICD-10-CM | POA: Diagnosis not present

## 2016-12-23 LAB — COMPREHENSIVE METABOLIC PANEL
ALT: 14 U/L (ref 0–55)
AST: 13 U/L (ref 5–34)
Albumin: 3.5 g/dL (ref 3.5–5.0)
Alkaline Phosphatase: 49 U/L (ref 40–150)
Anion Gap: 11 mEq/L (ref 3–11)
BUN: 10.2 mg/dL (ref 7.0–26.0)
CALCIUM: 8.8 mg/dL (ref 8.4–10.4)
CHLORIDE: 102 meq/L (ref 98–109)
CO2: 27 meq/L (ref 22–29)
CREATININE: 0.7 mg/dL (ref 0.6–1.1)
EGFR: >90 mL/min/{1.73_m2} (ref >90)
GLUCOSE: 81 mg/dL (ref 70–140)
POTASSIUM: 3.7 meq/L (ref 3.5–5.1)
SODIUM: 139 meq/L (ref 136–145)
Total Bilirubin: 0.59 mg/dL (ref 0.20–1.20)
Total Protein: 6.8 g/dL (ref 6.4–8.3)

## 2016-12-23 LAB — CBC WITH DIFFERENTIAL/PLATELET
BASO%: 0.2 % (ref 0.0–2.0)
Basophils Absolute: 0 10*3/uL (ref 0.0–0.1)
EOS%: 0.3 % (ref 0.0–7.0)
Eosinophils Absolute: 0 10*3/uL (ref 0.0–0.5)
HEMATOCRIT: 35.4 % (ref 34.8–46.6)
HGB: 11.7 g/dL (ref 11.6–15.9)
LYMPH#: 1.3 10*3/uL (ref 0.9–3.3)
LYMPH%: 21.1 % (ref 14.0–49.7)
MCH: 29 pg (ref 25.1–34.0)
MCHC: 33.1 g/dL (ref 31.5–36.0)
MCV: 87.6 fL (ref 79.5–101.0)
MONO#: 1.2 10*3/uL — ABNORMAL HIGH (ref 0.1–0.9)
MONO%: 18.3 % — AB (ref 0.0–14.0)
NEUT#: 3.8 10*3/uL (ref 1.5–6.5)
NEUT%: 60.1 % (ref 38.4–76.8)
Platelets: 167 10*3/uL (ref 145–400)
RBC: 4.04 10*6/uL (ref 3.70–5.45)
RDW: 18.3 % — AB (ref 11.2–14.5)
WBC: 6.3 10*3/uL (ref 3.9–10.3)

## 2016-12-23 LAB — TECHNOLOGIST REVIEW

## 2016-12-23 LAB — MAGNESIUM: Magnesium: 1.4 mg/dl — CL (ref 1.5–2.5)

## 2016-12-23 MED ORDER — CARBOPLATIN CHEMO INJECTION 600 MG/60ML
602.0000 mg | Freq: Once | INTRAVENOUS | Status: AC
  Administered 2016-12-23: 600 mg via INTRAVENOUS
  Filled 2016-12-23: qty 60

## 2016-12-23 MED ORDER — SODIUM CHLORIDE 0.9 % IV SOLN
100.0000 mg/m2 | Freq: Once | INTRAVENOUS | Status: AC
  Administered 2016-12-23: 190 mg via INTRAVENOUS
  Filled 2016-12-23: qty 9.5

## 2016-12-23 MED ORDER — PALONOSETRON HCL INJECTION 0.25 MG/5ML
INTRAVENOUS | Status: AC
  Filled 2016-12-23: qty 5

## 2016-12-23 MED ORDER — DEXAMETHASONE SODIUM PHOSPHATE 10 MG/ML IJ SOLN
INTRAMUSCULAR | Status: AC
  Filled 2016-12-23: qty 1

## 2016-12-23 MED ORDER — MAGNESIUM OXIDE 400 (241.3 MG) MG PO TABS: 400.0000 mg | ORAL_TABLET | Freq: Three times a day (TID) | ORAL | 0 refills | Status: DC

## 2016-12-23 MED ORDER — SODIUM CHLORIDE 0.9 % IV SOLN
Freq: Once | INTRAVENOUS | Status: AC
  Administered 2016-12-23: 11:00:00 via INTRAVENOUS

## 2016-12-23 MED ORDER — PALONOSETRON HCL INJECTION 0.25 MG/5ML
0.2500 mg | Freq: Once | INTRAVENOUS | Status: AC
  Administered 2016-12-23: 0.25 mg via INTRAVENOUS

## 2016-12-23 MED ORDER — DEXAMETHASONE SODIUM PHOSPHATE 10 MG/ML IJ SOLN
10.0000 mg | Freq: Once | INTRAMUSCULAR | Status: AC
  Administered 2016-12-23: 10 mg via INTRAVENOUS

## 2016-12-23 NOTE — Progress Notes (Signed)
12/23/2016 counselor met with patient and family member during infusion. Patient appeared alert and oriented and engaged clearly with counselor throughout visit. Patient described feeling relieved that she has recovered from the flu with some fear that she might get sick again. She expressed her determination to do all she can to take care of herself, including not going out unnecessarily and telling others when she does not feel energetic rather than making excuses for not talking or engaging in activities. Patient stated that she has accepted that cancer has changed her life and has made her body weaker. Counselor and patient briefly explored the idea that patient is grieving the change from her former energetic self. Counselor will follow-up with patient during treatment on 12/24/2016.  Lamount Cohen, Counseling Intern Department for Spiritual Care and [facility] Supervisor - Scientist, research (physical sciences)

## 2016-12-23 NOTE — Progress Notes (Signed)
[facility] [place] [phone]   [phone]  OFFICE PROGRESS NOTE  Glo Herring., MD [place] [address]  DIAGNOSIS: Limited stage (T1b, N2, M0) small cell lung cancer presented with left lower lobe/infrahilar mass and large mediastinal lymphadenopathy diagnosed in October 2017.  PRIOR THERAPY:Systemic chemotherapy with cisplatin 60 MG/M2 on day 1 and etoposide 120 MG/M2 on days 1, 2 and 3 status post 1 cycle. This was discontinued secondary to intolerance.  CURRENT THERAPY: Systemic chemotherapy with carboplatin for AUC of 4 on day 1 and etoposide 100 MG/M2 on days 1, 2 and 3 with Neulasta support on day 4 every 3 weeks. Status post 2 cycles.  INTERVAL HISTORY: Rebekah Henderson 61 y.o. female came to the clinic today accompanied by her ex-husband for follow-up visit. The patient tolerated the second cycle of her systemic chemotherapy with carboplatin and etoposide much better. She has no significant nausea, vomiting, diarrhea or constipation. She was admitted to the hospital 1 week after her treatment with fever as well as nausea, vomiting and diarrhea secondary to influenza A. she received IV hydration and replacement of her electrolytes and she felt much better. She was treated with Tamiflu during her admission. She missed radiation for the last few weeks. She is here today for reevaluation and resuming her systemic chemotherapy. She has no chest pain, shortness of breath, cough or hemoptysis. She has no significant weight loss or night sweats.  MEDICAL HISTORY: Past Medical History:  Diagnosis Date  . 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 Anxiety    takes Prozac daily  . Arthritis   . Arthritis   . Cancer ([place])    skin, lung  . Colon polyps   . COPD (chronic obstructive pulmonary disease) ([place])   . Diverticulitis   . Dyspnea    with exertion  . Encounter for antineoplastic chemotherapy 12/03/2016  .  Fibromyalgia   . GERD (gastroesophageal reflux disease)    takes Pantoprazole daily  . Hypertension    takes Metoprolol,Triamterene-HCTZ,and Amlodipine daily  . Hypothyroidism    takes Synthroid daily  . IBS (irritable bowel syndrome)   . PONV (postoperative nausea and vomiting)    pt also states that she had some difficulty breathing after cervical fusion    ALLERGIES:  is allergic to penicillins; codeine; ranitidine hcl; keflex [cephalexin]; and lyrica [pregabalin].  MEDICATIONS:  Current Outpatient Prescriptions  Medication Sig Dispense Refill  . albuterol (PROVENTIL) 2 MG tablet Take 2 mg by mouth daily as needed for wheezing or shortness of breath.     Marland Kitchen amLODipine (NORVASC) 5 MG tablet Take 5 mg by mouth daily.      Marland Kitchen aspirin EC 81 MG tablet Take 81 mg by mouth every other day.     . estazolam (PROSOM) 2 MG tablet Take 2 mg by mouth at bedtime.    Marland Kitchen estradiol (ESTRACE) 2 MG tablet Take 2 mg by mouth daily.      Marland Kitchen HYDROcodone-acetaminophen (NORCO/VICODIN) 5-325 MG tablet Take 1 tablet by mouth every 4 (four) hours as needed for moderate pain.   0  . levothyroxine (SYNTHROID, LEVOTHROID) 25 MCG tablet Take 25 mcg by mouth daily.      Marland Kitchen loperamide (IMODIUM) 2 MG capsule Take 2 mg by mouth every 2 (two) hours as needed for diarrhea or loose stools.    . metoprolol succinate (TOPROL-XL) 25 MG 24 hr tablet Take 25 mg by mouth daily.    . Multiple  Vitamin (MULTIVITAMIN WITH MINERALS) TABS tablet Take 2 tablets by mouth daily.    . pantoprazole (PROTONIX) 40 MG tablet Take 10 mg by mouth daily.  10  . triamterene-hydrochlorothiazide (DYAZIDE) 37.5-25 MG per capsule Take 1 capsule by mouth every morning.      . benzonatate (TESSALON) 100 MG capsule Take 1 capsule (100 mg total) by mouth 3 (three) times daily as needed for cough. (Patient not taking: Reported on 12/23/2016) 20 capsule 0  . FLUoxetine (PROZAC) 40 MG capsule Take 40 mg by mouth daily.   2  . guaiFENesin (MUCINEX) 600 MG 12 hr  tablet Take 1 tablet (600 mg total) by mouth 2 (two) times daily as needed. (Patient not taking: Reported on 12/23/2016) 20 tablet 0  . magnesium oxide (MAG-OX) 400 (241.3 Mg) MG tablet Take 1 tablet (400 mg total) by mouth 3 (three) times daily. (Patient not taking: Reported on 12/23/2016) 90 tablet 0  . ondansetron (ZOFRAN) 8 MG tablet Take 1 tablet (8 mg total) by mouth 3 (three) times daily as needed for nausea or vomiting. (Patient not taking: Reported on 12/23/2016) 30 tablet 0  . PROAIR HFA 108 (90 BASE) MCG/ACT inhaler Inhale 1-2 puffs into the lungs every 4 (four) hours as needed.   0   No current facility-administered medications for this visit.     SURGICAL HISTORY:  Past Surgical History:  Procedure Laterality Date  . BIOPSY N/A 05/25/2013   Procedure: BIOPSIES (Random Colon; Duodenal; Gastric);  Surgeon: Danie Binder, MD;  Location: [facility];  Service: Endoscopy;  Laterality: N/A;  . BLADDER SUSPENSION    . BREAST ENHANCEMENT SURGERY    . BREAST IMPLANT REMOVAL    . CERVICAL FUSION  AUG 2013  . CHOLECYSTECTOMY  1999  . COLONOSCOPY  2007 [place]   POLYPS  . COLONOSCOPY WITH PROPOFOL N/A 05/25/2013   Procedure: COLONOSCOPY WITH PROPOFOL(at cecum 0957) total withdrawal time=16mn);  Surgeon: SDanie Binder MD;  Location: [facility];  Service: Endoscopy;  Laterality: N/A;  . ESOPHAGOGASTRODUODENOSCOPY (EGD) WITH PROPOFOL N/A 05/25/2013   Procedure: ESOPHAGOGASTRODUODENOSCOPY (EGD) WITH PROPOFOL;  Surgeon: SDanie Binder MD;  Location: [facility];  Service: Endoscopy;  Laterality: N/A;  . FOOT SURGERY    . POLYPECTOMY N/A 05/25/2013   Procedure: POLYPECTOMY (Rectal and Gastric);  Surgeon: SDanie Binder MD;  Location: [facility];  Service: Endoscopy;  Laterality: N/A;  . TONSILLECTOMY    . UPPER GASTROINTESTINAL ENDOSCOPY    . VIDEO BRONCHOSCOPY WITH ENDOBRONCHIAL ULTRASOUND  09/12/2016   Procedure: VIDEO BRONCHOSCOPY WITH ENDOBRONCHIAL ULTRASOUND AND BIOPSY;  Surgeon: DJuanito Doom MD;   Location: [facility];  Service: Cardiopulmonary;;    REVIEW OF SYSTEMS:  Constitutional: positive for fatigue Eyes: negative Ears, nose, mouth, throat, and face: negative Respiratory: negative Cardiovascular: negative Gastrointestinal: negative Genitourinary:negative Integument/breast: positive for rash Hematologic/lymphatic: negative Musculoskeletal:negative Neurological: negative Behavioral/Psych: negative Endocrine: negative Allergic/Immunologic: negative   PHYSICAL EXAMINATION: General appearance: alert, cooperative, fatigued and no distress Head: Normocephalic, without obvious abnormality, atraumatic Neck: no adenopathy, no JVD, supple, symmetrical, trachea midline and thyroid not enlarged, symmetric, no tenderness/mass/nodules Lymph nodes: Cervical, supraclavicular, and axillary nodes normal. Resp: clear to auscultation bilaterally Back: symmetric, no curvature. ROM normal. No CVA tenderness. Cardio: regular rate and rhythm, S1, S2 normal, no murmur, click, rub or gallop GI: soft, non-tender; bowel sounds normal; no masses,  no organomegaly Extremities: Few areas of erythema in the feet Neurologic: Alert and oriented X 3, normal strength and tone. Normal symmetric reflexes. Normal  coordination and gait      ECOG PERFORMANCE STATUS: 1 - Symptomatic but completely ambulatory  Blood pressure (!) 149/78, pulse 80, temperature 97.9 F (36.6 C), temperature source Oral, resp. rate 18, height '5\' 4"'$  (1.626 m), weight 175 lb 9.6 oz (79.7 kg), SpO2 98 %.  LABORATORY DATA: Lab Results  Component Value Date   WBC 6.3 12/23/2016   HGB 11.7 12/23/2016   HCT 35.4 12/23/2016   MCV 87.6 12/23/2016   PLT 167 12/23/2016      Chemistry      Component Value Date/Time   NA 139 12/23/2016 0908   K 3.7 12/23/2016 0908   CL 101 12/11/2016 0415   CO2 27 12/23/2016 0908   BUN 10.2 12/23/2016 0908   CREATININE 0.7 12/23/2016 0908      Component Value Date/Time   CALCIUM 8.8 12/23/2016  0908   ALKPHOS 49 12/23/2016 0908   AST 13 12/23/2016 0908   ALT 14 12/23/2016 0908   BILITOT 0.59 12/23/2016 0908       RADIOGRAPHIC STUDIES: Dg Chest Port 1 View  Result Date: 12/07/2016 CLINICAL DATA:  Shortness of Breath EXAM: PORTABLE CHEST 1 VIEW COMPARISON:  10/19/2016 FINDINGS: The heart size and mediastinal contours are within normal limits. Both lungs are clear. The visualized skeletal structures are unremarkable. Postsurgical changes cervical spine is noted. IMPRESSION: No active disease. Electronically Signed   By: Inez Catalina M.D.   On: 12/07/2016 13:38    ASSESSMENT AND PLAN:  This is a very pleasant 61 years old white female recently diagnosed with limited stage small cell lung cancer. She is currently undergoing systemic chemotherapy with carboplatin and etoposide status post 2 cycles in addition to one cycle of cisplatin and etoposide initially. She is tolerating her current treatment much better. I recommended for the patient to proceed with cycle #3 today. I also strongly advised the patient to resume her concurrent radiotherapy. She will have repeat CT scan of the chest a few days before she comes for her next treatment in 3 weeks. For the hypomagnesemia, I started the patient on magnesium oxide 400 mg by mouth 3 times a day. For hypertension, the patient will continue her current treatment with Toprol-XL, Dyazide and Norvasc. For COPD, the patient will continue her current treatment with pro-air and albuterol. She was advised to call immediately if she has any concerning symptoms in the interval. The patient voices understanding of current disease status and treatment options and is in agreement with the current care plan.  All questions were answered. The patient knows to call the clinic with any problems, questions or concerns. We can certainly see the patient much sooner if necessary.  Disclaimer: This note was dictated with voice recognition software. Similar  sounding words can inadvertently be transcribed and may not be corrected upon review.

## 2016-12-23 NOTE — Progress Notes (Signed)
Oncology Nurse Navigator Documentation  Oncology Nurse Navigator Flowsheets 12/23/2016  Navigator Location [facility]  Navigator Encounter Type Clinic/MDC/spoke with patient and her ex-husband today.  She is feeling much better.  We discussed her treatment plan.  She is to finish radiation and explained why.  She stated she did not feel well enough for radiation and was not treated well.  She did state she would follow up with [facility] radiation today and finish her treatment.  I will update management of her complaints.   Treatment Phase Treatment  Barriers/Navigation Needs Education  Education Other  Interventions Education  Education Method Verbal  Acuity Level 2  Acuity Level 2 Educational needs  Time Spent with Patient 30

## 2016-12-23 NOTE — Patient Instructions (Signed)
[facility] Discharge Instructions for Patients Receiving Chemotherapy  Today you received the following chemotherapy agents: Etoposide and Carboplatin   To help prevent nausea and vomiting after your treatment, we encourage you to take your nausea medication as directed.    If you develop nausea and vomiting that is not controlled by your nausea medication, call the clinic.   BELOW ARE SYMPTOMS THAT SHOULD BE REPORTED IMMEDIATELY:  *FEVER GREATER THAN 100.5 F  *CHILLS WITH OR WITHOUT FEVER  NAUSEA AND VOMITING THAT IS NOT CONTROLLED WITH YOUR NAUSEA MEDICATION  *UNUSUAL SHORTNESS OF BREATH  *UNUSUAL BRUISING OR BLEEDING  TENDERNESS IN MOUTH AND THROAT WITH OR WITHOUT PRESENCE OF ULCERS  *URINARY PROBLEMS  *BOWEL PROBLEMS  UNUSUAL RASH Items with * indicate a potential emergency and should be followed up as soon as possible.  Feel free to call the clinic you have any questions or concerns. The clinic phone number is [phone].  Please show the CHEMO ALERT CARD at check-in to the Emergency Department and triage nurse.  

## 2016-12-24 ENCOUNTER — Ambulatory Visit (HOSPITAL_BASED_OUTPATIENT_CLINIC_OR_DEPARTMENT_OTHER): Payer: Medicare Other

## 2016-12-24 VITALS — BP 139/72 | HR 70 | Temp 98.1°F | Resp 18

## 2016-12-24 DIAGNOSIS — Z5111 Encounter for antineoplastic chemotherapy: Secondary | ICD-10-CM | POA: Diagnosis not present

## 2016-12-24 DIAGNOSIS — C3432 Malignant neoplasm of lower lobe, left bronchus or lung: Secondary | ICD-10-CM | POA: Diagnosis not present

## 2016-12-24 DIAGNOSIS — C3492 Malignant neoplasm of unspecified part of left bronchus or lung: Secondary | ICD-10-CM

## 2016-12-24 MED ORDER — HEPARIN SOD (PORK) LOCK FLUSH 100 UNIT/ML IV SOLN
500.0000 [IU] | Freq: Once | INTRAVENOUS | Status: DC | PRN
  Filled 2016-12-24: qty 5

## 2016-12-24 MED ORDER — DEXAMETHASONE SODIUM PHOSPHATE 10 MG/ML IJ SOLN
10.0000 mg | Freq: Once | INTRAMUSCULAR | Status: AC
  Administered 2016-12-24: 10 mg via INTRAVENOUS

## 2016-12-24 MED ORDER — SODIUM CHLORIDE 0.9 % IV SOLN
Freq: Once | INTRAVENOUS | Status: AC
  Administered 2016-12-24: 10:00:00 via INTRAVENOUS

## 2016-12-24 MED ORDER — SODIUM CHLORIDE 0.9 % IV SOLN
100.0000 mg/m2 | Freq: Once | INTRAVENOUS | Status: AC
  Administered 2016-12-24: 190 mg via INTRAVENOUS
  Filled 2016-12-24: qty 9.5

## 2016-12-24 MED ORDER — SODIUM CHLORIDE 0.9% FLUSH
10.0000 mL | INTRAVENOUS | Status: DC | PRN
  Filled 2016-12-24: qty 10

## 2016-12-24 MED ORDER — DEXAMETHASONE SODIUM PHOSPHATE 10 MG/ML IJ SOLN
INTRAMUSCULAR | Status: AC
  Filled 2016-12-24: qty 1

## 2016-12-24 NOTE — Patient Instructions (Signed)
[facility] Discharge Instructions for Patients Receiving Chemotherapy  Today you received the following chemotherapy agents [id number] To help prevent nausea and vomiting after your treatment, we encourage you to take your nausea medication as prescribed.  If you develop nausea and vomiting that is not controlled by your nausea medication, call the clinic.   BELOW ARE SYMPTOMS THAT SHOULD BE REPORTED IMMEDIATELY:  *FEVER GREATER THAN 100.5 F  *CHILLS WITH OR WITHOUT FEVER  NAUSEA AND VOMITING THAT IS NOT CONTROLLED WITH YOUR NAUSEA MEDICATION  *UNUSUAL SHORTNESS OF BREATH  *UNUSUAL BRUISING OR BLEEDING  TENDERNESS IN MOUTH AND THROAT WITH OR WITHOUT PRESENCE OF ULCERS  *URINARY PROBLEMS  *BOWEL PROBLEMS  UNUSUAL RASH Items with * indicate a potential emergency and should be followed up as soon as possible.  Feel free to call the clinic you have any questions or concerns. The clinic phone number is [phone].  Please show the [place] at check-in to the Emergency Department and triage nurse.

## 2016-12-24 NOTE — Progress Notes (Signed)
Counselor met with patient during infusion. Patient was alert and oriented and appeared in good spirits. Patient reflected with counselor on her interactions with her ex-husband during the previous visit. Patient indicated feeling that neither her ex-husband nor her son have come to terms with her cancer diagnosis and do not understand that her physical ailments prevent her from being more engaged socially.  Patient indicated that she feels less anxious now that she is more familiar with treatment and feels supported by her care team. Patient continues to rely on her faith and has begun to seek meaning in her experience. Counselor will follow-up with patient by phone within the week.  Lamount Cohen, Counseling Intern Department for Spiritual Care and [facility] Supervisor - Scientist, research (physical sciences)

## 2016-12-25 ENCOUNTER — Encounter: Payer: Self-pay | Admitting: *Deleted

## 2016-12-25 ENCOUNTER — Ambulatory Visit (HOSPITAL_BASED_OUTPATIENT_CLINIC_OR_DEPARTMENT_OTHER): Payer: Medicare Other

## 2016-12-25 VITALS — BP 111/67 | HR 62 | Temp 97.7°F | Resp 17

## 2016-12-25 DIAGNOSIS — Z5111 Encounter for antineoplastic chemotherapy: Secondary | ICD-10-CM | POA: Diagnosis not present

## 2016-12-25 DIAGNOSIS — C3432 Malignant neoplasm of lower lobe, left bronchus or lung: Secondary | ICD-10-CM

## 2016-12-25 DIAGNOSIS — C3492 Malignant neoplasm of unspecified part of left bronchus or lung: Secondary | ICD-10-CM

## 2016-12-25 MED ORDER — DEXAMETHASONE SODIUM PHOSPHATE 10 MG/ML IJ SOLN
10.0000 mg | Freq: Once | INTRAMUSCULAR | Status: AC
  Administered 2016-12-25: 10 mg via INTRAVENOUS

## 2016-12-25 MED ORDER — DEXAMETHASONE SODIUM PHOSPHATE 10 MG/ML IJ SOLN
INTRAMUSCULAR | Status: AC
  Filled 2016-12-25: qty 1

## 2016-12-25 MED ORDER — ETOPOSIDE CHEMO INJECTION 1 GM/50ML
100.0000 mg/m2 | Freq: Once | INTRAVENOUS | Status: AC
  Administered 2016-12-25: 190 mg via INTRAVENOUS
  Filled 2016-12-25: qty 9.5

## 2016-12-25 MED ORDER — SODIUM CHLORIDE 0.9 % IV SOLN
Freq: Once | INTRAVENOUS | Status: AC
  Administered 2016-12-25: 10:00:00 via INTRAVENOUS

## 2016-12-25 NOTE — Patient Instructions (Signed)
[facility] Discharge Instructions for Patients Receiving Chemotherapy  Today you received the following chemotherapy agents: Etoposide   To help prevent nausea and vomiting after your treatment, we encourage you to take your nausea medication as directed.    If you develop nausea and vomiting that is not controlled by your nausea medication, call the clinic.   BELOW ARE SYMPTOMS THAT SHOULD BE REPORTED IMMEDIATELY:  *FEVER GREATER THAN 100.5 F  *CHILLS WITH OR WITHOUT FEVER  NAUSEA AND VOMITING THAT IS NOT CONTROLLED WITH YOUR NAUSEA MEDICATION  *UNUSUAL SHORTNESS OF BREATH  *UNUSUAL BRUISING OR BLEEDING  TENDERNESS IN MOUTH AND THROAT WITH OR WITHOUT PRESENCE OF ULCERS  *URINARY PROBLEMS  *BOWEL PROBLEMS  UNUSUAL RASH Items with * indicate a potential emergency and should be followed up as soon as possible.  Feel free to call the clinic you have any questions or concerns. The clinic phone number is [phone].  Please show the CHEMO ALERT CARD at check-in to the Emergency Department and triage nurse.   

## 2016-12-25 NOTE — Progress Notes (Signed)
Oncology Nurse Navigator Documentation  Oncology Nurse Navigator Flowsheets 12/25/2016  Navigator Location [facility]  Navigator Encounter Type Treatment/spoke with patient during her treatment.  She states she is doing well. I listened as she explained.  She is very positive and has a good outlook.  I asked that she contact me if needed.   Treatment Phase Treatment  Barriers/Navigation Needs No barriers at this time  Interventions Other  Acuity Level 1  Acuity Level 1 Minimal follow up required  Time Spent with Patient 15

## 2016-12-27 ENCOUNTER — Ambulatory Visit (HOSPITAL_BASED_OUTPATIENT_CLINIC_OR_DEPARTMENT_OTHER): Payer: Medicare Other

## 2016-12-27 VITALS — BP 141/89 | HR 74 | Temp 98.4°F | Resp 18

## 2016-12-27 DIAGNOSIS — C3492 Malignant neoplasm of unspecified part of left bronchus or lung: Secondary | ICD-10-CM

## 2016-12-27 DIAGNOSIS — C3432 Malignant neoplasm of lower lobe, left bronchus or lung: Secondary | ICD-10-CM | POA: Diagnosis not present

## 2016-12-27 MED ORDER — PEGFILGRASTIM INJECTION 6 MG/0.6ML ~~LOC~~
6.0000 mg | PREFILLED_SYRINGE | Freq: Once | SUBCUTANEOUS | Status: AC
  Administered 2016-12-27: 6 mg via SUBCUTANEOUS
  Filled 2016-12-27: qty 0.6

## 2016-12-30 ENCOUNTER — Telehealth: Payer: Self-pay | Admitting: Internal Medicine

## 2016-12-30 NOTE — Telephone Encounter (Signed)
Added weekly labs and additional cycles of chemo. Left message for patient confirming appointments for 1/30, 2/5 and 21/13. Patient to get new schedule 1/30.

## 2016-12-31 ENCOUNTER — Telehealth: Payer: Self-pay | Admitting: Medical Oncology

## 2016-12-31 ENCOUNTER — Other Ambulatory Visit: Payer: Medicare Other

## 2016-12-31 ENCOUNTER — Other Ambulatory Visit (HOSPITAL_COMMUNITY)
Admission: RE | Admit: 2016-12-31 | Discharge: 2016-12-31 | Disposition: A | Discharge status: Home / Self Care | Payer: Medicare Other | Source: Ambulatory Visit | Attending: Internal Medicine | Admitting: Internal Medicine

## 2016-12-31 DIAGNOSIS — C3492 Malignant neoplasm of unspecified part of left bronchus or lung: Secondary | ICD-10-CM | POA: Insufficient documentation

## 2016-12-31 LAB — COMPREHENSIVE METABOLIC PANEL
ALBUMIN: 3.5 g/dL (ref 3.5–5.0)
ALT: 14 U/L (ref 14–54)
AST: 16 U/L (ref 15–41)
Alkaline Phosphatase: 39 U/L (ref 38–126)
Anion gap: 9 (ref 5–15)
BILIRUBIN TOTAL: 1.1 mg/dL (ref 0.3–1.2)
BUN: 11 mg/dL (ref 6–20)
CALCIUM: 8.2 mg/dL — AB (ref 8.9–10.3)
CO2: 26 mmol/L (ref 22–32)
Chloride: 102 mmol/L (ref 101–111)
Creatinine, Ser: 0.65 mg/dL (ref 0.44–1.00)
GFR calc Af Amer: >60 mL/min (ref >60)
GFR calc non Af Amer: >60 mL/min (ref >60)
GLUCOSE: 93 mg/dL (ref 65–99)
Potassium: 3.3 mmol/L — ABNORMAL LOW (ref 3.5–5.1)
Sodium: 137 mmol/L (ref 135–145)
TOTAL PROTEIN: 6.4 g/dL — AB (ref 6.5–8.1)

## 2016-12-31 LAB — CBC WITH DIFFERENTIAL/PLATELET
BASOS ABS: 0 10*3/uL (ref 0.0–0.1)
Basophils Relative: 0 %
EOS PCT: 1 %
Eosinophils Absolute: 0 10*3/uL (ref 0.0–0.7)
HEMATOCRIT: 26.8 % — AB (ref 36.0–46.0)
HEMOGLOBIN: 9.1 g/dL — AB (ref 12.0–15.0)
LYMPHS ABS: 0.7 10*3/uL (ref 0.7–4.0)
Lymphocytes Relative: 69 %
MCH: 29.1 pg (ref 26.0–34.0)
MCHC: 34 g/dL (ref 30.0–36.0)
MCV: 85.6 fL (ref 78.0–100.0)
MONOS PCT: 14 %
Monocytes Absolute: 0.1 10*3/uL (ref 0.1–1.0)
NEUTROS ABS: 0.2 10*3/uL — AB (ref 1.7–7.7)
Neutrophils Relative %: 16 %
Platelets: 130 10*3/uL — ABNORMAL LOW (ref 150–400)
RBC: 3.13 MIL/uL — AB (ref 3.87–5.11)
RDW: 16.4 % — AB (ref 11.5–15.5)
WBC: 1 10*3/uL — CL (ref 4.0–10.5)

## 2016-12-31 NOTE — Telephone Encounter (Signed)
critical wbc of 1.0 reported. Pt received neulasta on 1/26 . I called pt and left message to follow neutrapenic precautions and when to call.

## 2017-01-02 DIAGNOSIS — C3432 Malignant neoplasm of lower lobe, left bronchus or lung: Secondary | ICD-10-CM | POA: Diagnosis not present

## 2017-01-02 DIAGNOSIS — Z510 Encounter for antineoplastic radiation therapy: Secondary | ICD-10-CM | POA: Diagnosis not present

## 2017-01-03 DIAGNOSIS — Z510 Encounter for antineoplastic radiation therapy: Secondary | ICD-10-CM | POA: Diagnosis not present

## 2017-01-03 DIAGNOSIS — C3432 Malignant neoplasm of lower lobe, left bronchus or lung: Secondary | ICD-10-CM | POA: Diagnosis not present

## 2017-01-04 ENCOUNTER — Encounter (HOSPITAL_COMMUNITY): Payer: Self-pay | Admitting: *Deleted

## 2017-01-04 ENCOUNTER — Inpatient Hospital Stay (HOSPITAL_COMMUNITY)
Admission: EM | Admit: 2017-01-04 | Discharge: 2017-01-06 | DRG: 812 | Disposition: A | Discharge status: Home / Self Care | Payer: Medicare Other | Attending: Internal Medicine | Admitting: Internal Medicine

## 2017-01-04 ENCOUNTER — Other Ambulatory Visit: Payer: Self-pay

## 2017-01-04 DIAGNOSIS — Z881 Allergy status to other antibiotic agents status: Secondary | ICD-10-CM | POA: Diagnosis not present

## 2017-01-04 DIAGNOSIS — Z923 Personal history of irradiation: Secondary | ICD-10-CM

## 2017-01-04 DIAGNOSIS — Z79899 Other long term (current) drug therapy: Secondary | ICD-10-CM

## 2017-01-04 DIAGNOSIS — Z87891 Personal history of nicotine dependence: Secondary | ICD-10-CM

## 2017-01-04 DIAGNOSIS — J9610 Chronic respiratory failure, unspecified whether with hypoxia or hypercapnia: Secondary | ICD-10-CM | POA: Diagnosis not present

## 2017-01-04 DIAGNOSIS — C3492 Malignant neoplasm of unspecified part of left bronchus or lung: Secondary | ICD-10-CM | POA: Diagnosis not present

## 2017-01-04 DIAGNOSIS — I10 Essential (primary) hypertension: Secondary | ICD-10-CM | POA: Diagnosis present

## 2017-01-04 DIAGNOSIS — T451X5A Adverse effect of antineoplastic and immunosuppressive drugs, initial encounter: Secondary | ICD-10-CM | POA: Diagnosis present

## 2017-01-04 DIAGNOSIS — J449 Chronic obstructive pulmonary disease, unspecified: Secondary | ICD-10-CM

## 2017-01-04 DIAGNOSIS — E860 Dehydration: Secondary | ICD-10-CM | POA: Diagnosis present

## 2017-01-04 DIAGNOSIS — Z885 Allergy status to narcotic agent status: Secondary | ICD-10-CM | POA: Diagnosis not present

## 2017-01-04 DIAGNOSIS — Z888 Allergy status to other drugs, medicaments and biological substances status: Secondary | ICD-10-CM

## 2017-01-04 DIAGNOSIS — R21 Rash and other nonspecific skin eruption: Secondary | ICD-10-CM

## 2017-01-04 DIAGNOSIS — D709 Neutropenia, unspecified: Secondary | ICD-10-CM | POA: Diagnosis present

## 2017-01-04 DIAGNOSIS — E039 Hypothyroidism, unspecified: Secondary | ICD-10-CM | POA: Diagnosis not present

## 2017-01-04 DIAGNOSIS — K219 Gastro-esophageal reflux disease without esophagitis: Secondary | ICD-10-CM | POA: Diagnosis present

## 2017-01-04 DIAGNOSIS — F419 Anxiety disorder, unspecified: Secondary | ICD-10-CM | POA: Diagnosis not present

## 2017-01-04 DIAGNOSIS — Z801 Family history of malignant neoplasm of trachea, bronchus and lung: Secondary | ICD-10-CM | POA: Diagnosis not present

## 2017-01-04 DIAGNOSIS — D696 Thrombocytopenia, unspecified: Secondary | ICD-10-CM | POA: Diagnosis not present

## 2017-01-04 DIAGNOSIS — E876 Hypokalemia: Secondary | ICD-10-CM | POA: Diagnosis not present

## 2017-01-04 DIAGNOSIS — Z79891 Long term (current) use of opiate analgesic: Secondary | ICD-10-CM

## 2017-01-04 DIAGNOSIS — D649 Anemia, unspecified: Principal | ICD-10-CM | POA: Diagnosis present

## 2017-01-04 DIAGNOSIS — Z880 Allergy status to penicillin: Secondary | ICD-10-CM | POA: Diagnosis not present

## 2017-01-04 DIAGNOSIS — K449 Diaphragmatic hernia without obstruction or gangrene: Secondary | ICD-10-CM | POA: Diagnosis present

## 2017-01-04 DIAGNOSIS — C3432 Malignant neoplasm of lower lobe, left bronchus or lung: Secondary | ICD-10-CM | POA: Diagnosis not present

## 2017-01-04 DIAGNOSIS — R0682 Tachypnea, not elsewhere classified: Secondary | ICD-10-CM | POA: Diagnosis not present

## 2017-01-04 DIAGNOSIS — Z803 Family history of malignant neoplasm of breast: Secondary | ICD-10-CM

## 2017-01-04 DIAGNOSIS — C3490 Malignant neoplasm of unspecified part of unspecified bronchus or lung: Secondary | ICD-10-CM

## 2017-01-04 LAB — CBC WITH DIFFERENTIAL/PLATELET
BASOS PCT: 1 %
Basophils Absolute: 0 10*3/uL (ref 0.0–0.1)
EOS PCT: 1 %
Eosinophils Absolute: 0 10*3/uL (ref 0.0–0.7)
HEMATOCRIT: 20.6 % — AB (ref 36.0–46.0)
Hemoglobin: 7.4 g/dL — ABNORMAL LOW (ref 12.0–15.0)
LYMPHS PCT: 22 %
Lymphs Abs: 1 10*3/uL (ref 0.7–4.0)
MCH: 28.8 pg (ref 26.0–34.0)
MCHC: 35.9 g/dL (ref 30.0–36.0)
MCV: 80.2 fL (ref 78.0–100.0)
MONO ABS: 0.8 10*3/uL (ref 0.1–1.0)
Monocytes Relative: 18 %
NEUTROS PCT: 58 %
Neutro Abs: 2.6 10*3/uL (ref 1.7–7.7)
PLATELETS: 19 10*3/uL — AB (ref 150–400)
RBC: 2.57 MIL/uL — AB (ref 3.87–5.11)
RDW: 16.6 % — AB (ref 11.5–15.5)
WBC: 4.4 10*3/uL (ref 4.0–10.5)

## 2017-01-04 LAB — COMPREHENSIVE METABOLIC PANEL
ALT: 11 U/L — ABNORMAL LOW (ref 14–54)
ANION GAP: 13 (ref 5–15)
AST: 18 U/L (ref 15–41)
Albumin: 3.5 g/dL (ref 3.5–5.0)
Alkaline Phosphatase: 46 U/L (ref 38–126)
BUN: 8 mg/dL (ref 6–20)
CHLORIDE: 102 mmol/L (ref 101–111)
CO2: 24 mmol/L (ref 22–32)
Calcium: 8.2 mg/dL — ABNORMAL LOW (ref 8.9–10.3)
Creatinine, Ser: 0.68 mg/dL (ref 0.44–1.00)
Glucose, Bld: 95 mg/dL (ref 65–99)
Potassium: 3.1 mmol/L — ABNORMAL LOW (ref 3.5–5.1)
SODIUM: 139 mmol/L (ref 135–145)
Total Bilirubin: 0.7 mg/dL (ref 0.3–1.2)
Total Protein: 6.5 g/dL (ref 6.5–8.1)

## 2017-01-04 LAB — PROTIME-INR
INR: 0.98
Prothrombin Time: 13 seconds (ref 11.4–15.2)

## 2017-01-04 LAB — I-STAT TROPONIN, ED: Troponin i, poc: 0 ng/mL (ref 0.00–0.08)

## 2017-01-04 LAB — POC OCCULT BLOOD, ED: FECAL OCCULT BLD: NEGATIVE

## 2017-01-04 LAB — PREPARE RBC (CROSSMATCH)

## 2017-01-04 MED ORDER — ALBUTEROL SULFATE HFA 108 (90 BASE) MCG/ACT IN AERS: 1.0000 | INHALATION_SPRAY | RESPIRATORY_TRACT | Status: DC | PRN

## 2017-01-04 MED ORDER — TEMAZEPAM 15 MG PO CAPS
30.0000 mg | ORAL_CAPSULE | Freq: Every evening | ORAL | Status: DC | PRN
Start: 2017-01-04 — End: 2017-01-06

## 2017-01-04 MED ORDER — AMLODIPINE BESYLATE 5 MG PO TABS
5.0000 mg | ORAL_TABLET | Freq: Every day | ORAL | Status: DC
  Administered 2017-01-05 – 2017-01-06 (×2): 5 mg via ORAL
  Filled 2017-01-04 (×2): qty 1

## 2017-01-04 MED ORDER — ESTRADIOL 2 MG PO TABS
2.0000 mg | ORAL_TABLET | Freq: Every day | ORAL | Status: DC
  Administered 2017-01-05 – 2017-01-06 (×2): 2 mg via ORAL
  Filled 2017-01-04 (×2): qty 1

## 2017-01-04 MED ORDER — ACETAMINOPHEN 325 MG PO TABS
650.0000 mg | ORAL_TABLET | Freq: Once | ORAL | Status: AC
  Administered 2017-01-04: 650 mg via ORAL
  Filled 2017-01-04: qty 2

## 2017-01-04 MED ORDER — HYDROMORPHONE HCL 2 MG/ML IJ SOLN
0.5000 mg | INTRAMUSCULAR | Status: DC | PRN
  Administered 2017-01-05: 0.5 mg via INTRAVENOUS
  Filled 2017-01-04: qty 1

## 2017-01-04 MED ORDER — ALBUTEROL SULFATE (2.5 MG/3ML) 0.083% IN NEBU: 2.5000 mg | INHALATION_SOLUTION | RESPIRATORY_TRACT | Status: DC | PRN

## 2017-01-04 MED ORDER — SODIUM CHLORIDE 0.9 % IV SOLN
Freq: Once | INTRAVENOUS | Status: AC
  Administered 2017-01-04: 23:00:00 via INTRAVENOUS

## 2017-01-04 MED ORDER — LEVOTHYROXINE SODIUM 25 MCG PO TABS
25.0000 ug | ORAL_TABLET | Freq: Every day | ORAL | Status: DC
  Administered 2017-01-05 – 2017-01-06 (×2): 25 ug via ORAL
  Filled 2017-01-04 (×2): qty 1

## 2017-01-04 MED ORDER — ONDANSETRON 4 MG PO TBDP
4.0000 mg | ORAL_TABLET | Freq: Once | ORAL | Status: AC
  Administered 2017-01-04: 4 mg via ORAL
  Filled 2017-01-04: qty 1

## 2017-01-04 MED ORDER — POTASSIUM CHLORIDE IN NACL 40-0.9 MEQ/L-% IV SOLN
INTRAVENOUS | Status: AC
  Administered 2017-01-05: 50 mL/h via INTRAVENOUS
  Filled 2017-01-04: qty 1000

## 2017-01-04 MED ORDER — ONDANSETRON HCL 4 MG/2ML IJ SOLN: 4.0000 mg | Freq: Four times a day (QID) | INTRAMUSCULAR | Status: DC | PRN

## 2017-01-04 MED ORDER — PANTOPRAZOLE SODIUM 40 MG PO TBEC
40.0000 mg | DELAYED_RELEASE_TABLET | Freq: Two times a day (BID) | ORAL | Status: DC
  Administered 2017-01-05 – 2017-01-06 (×3): 40 mg via ORAL
  Filled 2017-01-04 (×3): qty 1

## 2017-01-04 MED ORDER — METOPROLOL SUCCINATE ER 25 MG PO TB24
25.0000 mg | ORAL_TABLET | Freq: Every day | ORAL | Status: DC
  Administered 2017-01-05 – 2017-01-06 (×2): 25 mg via ORAL
  Filled 2017-01-04 (×2): qty 1

## 2017-01-04 MED ORDER — POTASSIUM CHLORIDE CRYS ER 20 MEQ PO TBCR
20.0000 meq | EXTENDED_RELEASE_TABLET | Freq: Once | ORAL | Status: AC
  Administered 2017-01-05: 20 meq via ORAL
  Filled 2017-01-04: qty 1

## 2017-01-04 MED ORDER — ADULT MULTIVITAMIN W/MINERALS CH
1.0000 | ORAL_TABLET | Freq: Every day | ORAL | Status: DC
  Administered 2017-01-05 – 2017-01-06 (×2): 1 via ORAL
  Filled 2017-01-04 (×2): qty 1

## 2017-01-04 MED ORDER — FLUOXETINE HCL 20 MG PO CAPS
40.0000 mg | ORAL_CAPSULE | Freq: Every day | ORAL | Status: DC
  Administered 2017-01-05 – 2017-01-06 (×2): 40 mg via ORAL
  Filled 2017-01-04 (×2): qty 2

## 2017-01-04 MED ORDER — HYDROCODONE-ACETAMINOPHEN 5-325 MG PO TABS
1.0000 | ORAL_TABLET | ORAL | Status: DC | PRN
  Administered 2017-01-05 – 2017-01-06 (×5): 1 via ORAL
  Filled 2017-01-04 (×5): qty 1

## 2017-01-04 MED ORDER — HYDROCODONE-ACETAMINOPHEN 5-325 MG PO TABS
1.0000 | ORAL_TABLET | Freq: Once | ORAL | Status: AC
  Administered 2017-01-04: 1 via ORAL
  Filled 2017-01-04: qty 1

## 2017-01-04 MED ORDER — MAGNESIUM OXIDE 400 (241.3 MG) MG PO TABS
400.0000 mg | ORAL_TABLET | Freq: Three times a day (TID) | ORAL | Status: DC
  Administered 2017-01-05 – 2017-01-06 (×5): 400 mg via ORAL
  Filled 2017-01-04 (×5): qty 1

## 2017-01-04 MED ORDER — LOPERAMIDE HCL 2 MG PO CAPS: 2.0000 mg | ORAL_CAPSULE | ORAL | Status: DC | PRN

## 2017-01-04 MED ORDER — DIPHENHYDRAMINE HCL 50 MG/ML IJ SOLN
25.0000 mg | Freq: Once | INTRAMUSCULAR | Status: AC
  Administered 2017-01-04: 25 mg via INTRAVENOUS
  Filled 2017-01-04: qty 1

## 2017-01-04 MED ORDER — ONDANSETRON HCL 4 MG PO TABS: 4.0000 mg | ORAL_TABLET | Freq: Four times a day (QID) | ORAL | Status: DC | PRN

## 2017-01-04 MED ORDER — DICYCLOMINE HCL 10 MG PO CAPS
10.0000 mg | ORAL_CAPSULE | Freq: Three times a day (TID) | ORAL | Status: DC | PRN
  Filled 2017-01-04: qty 1

## 2017-01-04 NOTE — ED Triage Notes (Signed)
EMS reports pt sent for  Blood transfusion, Ca pt.

## 2017-01-04 NOTE — ED Provider Notes (Signed)
Emergency Department Provider Note   I have reviewed the triage vital signs and the nursing notes.   HISTORY  Chief Complaint Sent by Velora Heckler for blood trans   HPI Rebekah Henderson is a 61 y.o. female with PMH of anxiety, GERD, COPD, HTN, and small cell lung cancer on active chemo and radiation presents to the emergency department for evaluation of progressively worsening weakness, backaches, and low blood counts. The patient was called by her oncology provider encouraged to present to the emergency department. She was found to be neutropenic on blood work drawn several days ago. She denies any fever or shaking chills. No flulike symptoms. She is required blood transfusions in the past. Denies any obvious source of bleeding. Her symptoms been gradually worsening over the past several days.    Past Medical History:  Diagnosis Date  . 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 Anxiety    takes Prozac daily  . Arthritis   . Arthritis   . Cancer ([place])    skin, lung  . Colon polyps   . COPD (chronic obstructive pulmonary disease) ([place])   . Diverticulitis   . Dyspnea    with exertion  . Encounter for antineoplastic chemotherapy 12/03/2016  . Fibromyalgia   . GERD (gastroesophageal reflux disease)    takes Pantoprazole daily  . Hypertension    takes Metoprolol,Triamterene-HCTZ,and Amlodipine daily  . Hypothyroidism    takes Synthroid daily  . IBS (irritable bowel syndrome)   . PONV (postoperative nausea and vomiting)    pt also states that she had some difficulty breathing after cervical fusion    Patient Active Problem List   Diagnosis Date Noted  . Symptomatic anemia 01/04/2017  . Thrombocytopenia ([place]) 01/04/2017  . Influenza A 12/10/2016  . SIRS (systemic inflammatory response syndrome) ([place]) 12/07/2016  . Hypoxia 12/07/2016  . Bronchitis 12/07/2016  . Encounter for antineoplastic chemotherapy 12/03/2016  . Antineoplastic chemotherapy induced anemia  12/03/2016  . Protein-calorie malnutrition, severe 10/30/2016  . Vomiting 10/29/2016  . Abdominal pain 10/29/2016  . Rash and nonspecific skin eruption 10/29/2016  . Neutropenic fever ([place]) 10/15/2016  . Intractable nausea and vomiting 10/15/2016  . Pancytopenia due to antineoplastic chemotherapy ([place]) 10/15/2016  . Nodular rash 10/15/2016  . Hypokalemia 10/15/2016  . Hypomagnesemia 10/15/2016  . Chronic respiratory failure with hypoxia ([place]) 10/15/2016  . Small cell lung cancer, left ([place]) 09/19/2016  . Mediastinal mass 09/10/2016  . COPD (chronic obstructive pulmonary disease) ([place])   . Hypertension   . Fibromyalgia   . IBS (irritable bowel syndrome)   . GERD (gastroesophageal reflux disease)   . Hypothyroidism   . Colon polyps   . Anxiety   . PONV (postoperative nausea and vomiting)   . Diarrhea 05/12/2013    Past Surgical History:  Procedure Laterality Date  . BIOPSY N/A 05/25/2013   Procedure: BIOPSIES (Random Colon; Duodenal; Gastric);  Surgeon: Danie Binder, MD;  Location: [facility];  Service: Endoscopy;  Laterality: N/A;  . BLADDER SUSPENSION    . BREAST ENHANCEMENT SURGERY    . BREAST IMPLANT REMOVAL    . CERVICAL FUSION  AUG 2013  . CHOLECYSTECTOMY  1999  . COLONOSCOPY  [place]   POLYPS  . COLONOSCOPY WITH PROPOFOL N/A 05/25/2013   Procedure: COLONOSCOPY WITH PROPOFOL(at cecum 0957) total withdrawal time=62mn);  Surgeon: SDanie Binder MD;  Location: [facility];  Service: Endoscopy;  Laterality: N/A;  . ESOPHAGOGASTRODUODENOSCOPY (EGD) WITH PROPOFOL N/A 05/25/2013   Procedure: ESOPHAGOGASTRODUODENOSCOPY (EGD) WITH PROPOFOL;  Surgeon: Danie Binder, MD;  Location: [facility];  Service: Endoscopy;  Laterality: N/A;  . FOOT SURGERY    . POLYPECTOMY N/A 05/25/2013   Procedure: POLYPECTOMY (Rectal and Gastric);  Surgeon: Danie Binder, MD;  Location: [facility];  Service: Endoscopy;  Laterality: N/A;  . TONSILLECTOMY    . UPPER GASTROINTESTINAL ENDOSCOPY    . VIDEO  BRONCHOSCOPY WITH ENDOBRONCHIAL ULTRASOUND  09/12/2016   Procedure: VIDEO BRONCHOSCOPY WITH ENDOBRONCHIAL ULTRASOUND AND BIOPSY;  Surgeon: Juanito Doom, MD;  Location: [facility];  Service: Cardiopulmonary;;      Allergies Penicillins; Codeine; Ranitidine hcl; Keflex [cephalexin]; and Lyrica [pregabalin]  Family History  Problem Relation Age of Onset  . Breast cancer Mother   . Diabetes Maternal Grandfather   . Lung cancer Father   . Heart failure Sister 60    Died. Morbidly obese  . Colon cancer Neg Hx   . Colon polyps Neg Hx     Social History Social History  Substance Use Topics  . Smoking status: Former Smoker    Packs/day: 2.00    Years: 20.00    Quit date: 05/20/2004  . Smokeless tobacco: Never Used  . Alcohol use No    Review of Systems  Constitutional: No fever/chills. Positive weakness.  Eyes: No visual changes. ENT: No sore throat. Cardiovascular: Denies chest pain. Respiratory: Denies shortness of breath. Gastrointestinal: No abdominal pain. No nausea, no vomiting. No diarrhea. No constipation. Genitourinary: Negative for dysuria. Musculoskeletal: Positive for back pain. Skin: Negative for rash. Neurological: Negative for headaches, focal weakness or numbness.  10-point ROS otherwise negative.  ____________________________________________   PHYSICAL EXAM:  VITAL SIGNS: Temp: 63F Pulse: 86 Resp: 22 BP: 99/58 SpO2: 93% RA     Weight 01/04/17 1708 175 lb (79.4 kg)     Height 01/04/17 1707 '5\' 4"'$  (1.626 m)   Constitutional: Alert and oriented. Chronically ill-appearing.  Eyes: Conjunctivae are normal.  Head: Atraumatic. Nose: No congestion/rhinnorhea. Mouth/Throat: Mucous membranes are dry. Oropharynx non-erythematous. Neck: No stridor.  Cardiovascular: Normal rate, regular rhythm. Good peripheral circulation. Grossly normal heart sounds.   Respiratory: Normal respiratory effort.  No retractions. Lungs CTAB. Gastrointestinal: Soft and  nontender. No distention. Brown stool on rectal exam. No melena. No gross blood. Small external, non-thrombosed hemorrhoid noted. Musculoskeletal: No lower extremity tenderness nor edema. No gross deformities of extremities. Neurologic:  Normal speech and language. No gross focal neurologic deficits are appreciated.  Skin:  Skin is warm, dry and intact. No rash noted. Psychiatric: Mood and affect are normal. Speech and behavior are normal.  ____________________________________________   LABS (all labs ordered are listed, but only abnormal results are displayed)  Labs Reviewed  COMPREHENSIVE METABOLIC PANEL - Abnormal; Notable for the following:       Result Value   Potassium 3.1 (*)    Calcium 8.2 (*)    ALT 11 (*)    All other components within normal limits  CBC WITH DIFFERENTIAL/PLATELET - Abnormal; Notable for the following:    RBC 2.57 (*)    Hemoglobin 7.4 (*)    HCT 20.6 (*)    RDW 16.6 (*)    Platelets 19 (*)    All other components within normal limits  MAGNESIUM - Abnormal; Notable for the following:    Magnesium 1.2 (*)    All other components within normal limits  BASIC METABOLIC PANEL - Abnormal; Notable for the following:    Potassium 3.3 (*)    BUN 5 (*)    Calcium  7.9 (*)    All other components within normal limits  CBC - Abnormal; Notable for the following:    RBC 2.79 (*)    Hemoglobin 7.8 (*)    HCT 22.4 (*)    RDW 15.8 (*)    Platelets 13 (*)    All other components within normal limits  CULTURE, BLOOD (ROUTINE X 2)  CULTURE, BLOOD (ROUTINE X 2)  PROTIME-INR  URINALYSIS, ROUTINE W REFLEX MICROSCOPIC  I-STAT TROPOININ, ED  POC OCCULT BLOOD, ED  TYPE AND SCREEN  PREPARE RBC (CROSSMATCH)   ____________________________________________  RADIOLOGY  None ____________________________________________   PROCEDURES  Procedure(s) performed:   Procedures  None ____________________________________________   INITIAL IMPRESSION / ASSESSMENT AND  PLAN / ED COURSE  Pertinent labs & imaging results that were available during my care of the patient were reviewed by me and considered in my medical decision making (see chart for details).  Patient with past nuchal history of small cell lung cancer on chemotherapy and radiation presents with aggressively worsening generalized weakness and neutropenia found on recent lab work. She was referred to the emergency department by her oncologist. No fevers at home. No obvious source of infection on my exam.  Discussed patient's case with hospitalist, Dr. Myna Hidalgo.  Recommend admission to med-surg, obs bed.  I will place holding orders per their request. Patient and family (if present) updated with plan. Care transferred to hospitalist service.  I reviewed all nursing notes, vitals, pertinent old records, EKGs, labs, imaging (as available).  ____________________________________________  FINAL CLINICAL IMPRESSION(S) / ED DIAGNOSES  Final diagnoses:  Anemia, unspecified type  Thrombocytopenia (HCC)     MEDICATIONS GIVEN DURING THIS VISIT:  Medications  dicyclomine (BENTYL) capsule 10 mg (not administered)  magnesium oxide (MAG-OX) tablet 400 mg (400 mg Oral Given 01/05/17 0044)  pantoprazole (PROTONIX) EC tablet 40 mg (40 mg Oral Given 01/05/17 0753)  loperamide (IMODIUM) capsule 2 mg (not administered)  HYDROcodone-acetaminophen (NORCO/VICODIN) 5-325 MG per tablet 1-2 tablet (not administered)  multivitamin with minerals tablet 1 tablet (not administered)  FLUoxetine (PROZAC) capsule 40 mg (not administered)  temazepam (RESTORIL) capsule 30 mg (not administered)  metoprolol succinate (TOPROL-XL) 24 hr tablet 25 mg (not administered)  amLODipine (NORVASC) tablet 5 mg (not administered)  levothyroxine (SYNTHROID, LEVOTHROID) tablet 25 mcg (25 mcg Oral Given 01/05/17 0753)  estradiol (ESTRACE) tablet 2 mg (not administered)  ondansetron (ZOFRAN) tablet 4 mg (not administered)    Or  ondansetron  (ZOFRAN) injection 4 mg (not administered)  HYDROmorphone (DILAUDID) injection 0.5-1 mg (0.5 mg Intravenous Given 01/05/17 0522)  0.9 % NaCl with KCl 40 mEq / L  infusion (50 mL/hr Intravenous New Bag/Given 01/05/17 0250)  albuterol (PROVENTIL) (2.5 MG/3ML) 0.083% nebulizer solution 2.5 mg (not administered)  ondansetron (ZOFRAN-ODT) disintegrating tablet 4 mg (4 mg Oral Given 01/04/17 1838)  HYDROcodone-acetaminophen (NORCO/VICODIN) 5-325 MG per tablet 1 tablet (1 tablet Oral Given 01/04/17 1838)  0.9 %  sodium chloride infusion ( Intravenous New Bag/Given 01/04/17 2233)  acetaminophen (TYLENOL) tablet 650 mg (650 mg Oral Given 01/04/17 2227)  diphenhydrAMINE (BENADRYL) injection 25 mg (25 mg Intravenous Given 01/04/17 2228)  potassium chloride SA (K-DUR,KLOR-CON) CR tablet 20 mEq (20 mEq Oral Given 01/05/17 0044)     NEW OUTPATIENT MEDICATIONS STARTED DURING THIS VISIT:  None   Note:  This document was prepared using Dragon voice recognition software and may include unintentional dictation errors.  Nanda Quinton, MD Emergency Medicine   Margette Fast, MD 01/05/17 [phone]

## 2017-01-04 NOTE — H&P (Signed)
History and Physical    Rebekah Henderson KYH:[id number] DOB: [birth year] DOA: 01/04/2017  PCP: Glo Herring., MD   Patient coming from: Home  Chief Complaint: Generalized weakness, fatigue   HPI: Rebekah Henderson is a 61 y.o. 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 Patient notes increased generalized weakness and fatigue going back approximately 2 weeks, but worsening more significantly over the past week. She reports that over the past couple days, she feels exhausted just changing her close. She denies any fevers or chills and denies chest pain, palpitations, significant cough, or dyspnea. She endorses some mild intermittent lower abdominal cramping that she attributes to her IBS, but also endorses nausea with nonbloody vomiting and some chronic loose stools. She denies dysuria or urinary urgency or frequency, and denies flank pain. She denies any melena or hematochezia and denies any bleeding from her gums or petechial rash. She is under the care of oncology) undergoing chemotherapy with cisplatin and etoposide for small cell lung cancer, with last infusion on 12/27/2016. She reports receiving Neulasta on 12/29/2016. She discussed her current complaints with her oncologist and was directed to the emergency department for further evaluation.  ED Course: Upon arrival to the ED, patient is found to be saturating adequately on 2 L/m of supplemental oxygen, and with vitals otherwise stable. EKG features a sinus rhythm with PAC. Chemistry panels notable for potassium of 3.1 and CBC features a worsening anemia with hemoglobin 7.4, down from 9.1 less than a week ago, and down from 11.7 two weeks ago. There is a thrombocytopenia of 19,000, down from 130,000 on 12/31/2016. DRE was performed and stool is FOBT  negative. Patient was given Norco and Zofran in the ED and a type and screen was performed. She remained hemodynamically stable and in no acute respiratory distress, but given her more than 4 g drop in hemoglobin over the past 2 weeks, with which she is quite symptomatic, she will be observed on the medical-surgical unit for further evaluation and management of this.  Review of Systems:  All other systems reviewed and apart from HPI, are negative.  Past Medical History:  Diagnosis Date  . 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 Anxiety    takes Prozac daily  . Arthritis   . Arthritis   . Cancer ([place])    skin, lung  . Colon polyps   . COPD (chronic obstructive pulmonary disease) ([place])   . Diverticulitis   . Dyspnea    with exertion  . Encounter for antineoplastic chemotherapy 12/03/2016  . Fibromyalgia   . GERD (gastroesophageal reflux disease)    takes Pantoprazole daily  . Hypertension    takes Metoprolol,Triamterene-HCTZ,and Amlodipine daily  . Hypothyroidism    takes Synthroid daily  . IBS (irritable bowel syndrome)   . PONV (postoperative nausea and vomiting)    pt also states that she had some difficulty breathing after cervical fusion    Past Surgical History:  Procedure Laterality Date  . BIOPSY N/A 05/25/2013   Procedure: BIOPSIES (Random Colon; Duodenal; Gastric);  Surgeon: Danie Binder, MD;  Location: [facility];  Service: Endoscopy;  Laterality: N/A;  . BLADDER SUSPENSION    . BREAST ENHANCEMENT SURGERY    . BREAST IMPLANT REMOVAL    . CERVICAL FUSION  AUG 2013  . CHOLECYSTECTOMY  1999  . COLONOSCOPY  2007 [place]  POLYPS  . COLONOSCOPY WITH PROPOFOL N/A 05/25/2013   Procedure: COLONOSCOPY WITH PROPOFOL(at cecum 0957) total withdrawal time=63mn);  Surgeon: SDanie Binder MD;  Location: [facility];  Service: Endoscopy;  Laterality: N/A;  . ESOPHAGOGASTRODUODENOSCOPY (EGD) WITH PROPOFOL N/A 05/25/2013   Procedure: ESOPHAGOGASTRODUODENOSCOPY (EGD) WITH  PROPOFOL;  Surgeon: SDanie Binder MD;  Location: [facility];  Service: Endoscopy;  Laterality: N/A;  . FOOT SURGERY    . POLYPECTOMY N/A 05/25/2013   Procedure: POLYPECTOMY (Rectal and Gastric);  Surgeon: SDanie Binder MD;  Location: [facility];  Service: Endoscopy;  Laterality: N/A;  . TONSILLECTOMY    . UPPER GASTROINTESTINAL ENDOSCOPY    . VIDEO BRONCHOSCOPY WITH ENDOBRONCHIAL ULTRASOUND  09/12/2016   Procedure: VIDEO BRONCHOSCOPY WITH ENDOBRONCHIAL ULTRASOUND AND BIOPSY;  Surgeon: DJuanito Doom MD;  [facility]  Service: Cardiopulmonary;;     reports that she quit smoking about 12 years ago. She has a 40.00 pack-year smoking history. She has never used smokeless tobacco. She reports that she does not drink alcohol or use drugs.  Allergies  Allergen Reactions  . Penicillins Hives and Swelling    Has patient had a PCN reaction causing immediate rash, facial/tongue/throat swelling, SOB or lightheadedness with hypotension:unsure  Has patient had a PCN reaction causing severe rash involving mucus membranes or skin necrosis:unsure Has patient had a PCN reaction that required hospitalization:No Has patient had a PCN reaction occurring within the last 10 years:No If all of the above answers are "NO", then may proceed with Cephalosporin use.   . Codeine Nausea Only  . Ranitidine Hcl Other (See Comments)    dizzy  . Keflex [Cephalexin] Other (See Comments)    UNSPECIFIED REACTION   . Lyrica [Pregabalin] Other (See Comments)    lethargic    Family History  Problem Relation Age of Onset  . Breast cancer Mother   . Diabetes Maternal Grandfather   . Lung cancer Father   . Heart failure Sister 483   Died. Morbidly obese  . Colon cancer Neg Hx   . Colon polyps Neg Hx      Prior to Admission medications   Medication Sig Start Date End Date Taking? Authorizing Provider  albuterol (PROVENTIL) 2 MG tablet Take 2 mg by mouth daily as needed for wheezing or shortness of breath.    Yes  Historical Provider, MD  amLODipine (NORVASC) 5 MG tablet Take 5 mg by mouth daily.     Yes Historical Provider, MD  aspirin EC 81 MG tablet Take 81 mg by mouth every other day.    Yes Historical Provider, MD  Dicyclomine HCl (BENTYL PO) Take 1 tablet by mouth 3 (three) times daily as needed (IBS).   Yes Historical Provider, MD  estazolam (PROSOM) 2 MG tablet Take 2 mg by mouth at bedtime.   Yes Historical Provider, MD  estradiol (ESTRACE) 2 MG tablet Take 2 mg by mouth daily.     Yes Historical Provider, MD  FLUoxetine (PROZAC) 40 MG capsule Take 40 mg by mouth daily.  06/03/15  Yes Historical Provider, MD  HYDROcodone-acetaminophen (NORCO/VICODIN) 5-325 MG tablet Take 1 tablet by mouth every 4 (four) hours as needed for moderate pain.  10/14/16  Yes Historical Provider, MD  levothyroxine (SYNTHROID, LEVOTHROID) 25 MCG tablet Take 25 mcg by mouth daily before breakfast.    Yes Historical Provider, MD  loperamide (IMODIUM) 2 MG capsule Take 2 mg by mouth every 2 (two) hours as needed for diarrhea or loose stools.  Yes Historical Provider, MD  magnesium oxide (MAG-OX) 400 (241.3 Mg) MG tablet Take 1 tablet (400 mg total) by mouth 3 (three) times daily. 12/23/16  Yes Curt Bears, MD  metoprolol succinate (TOPROL-XL) 25 MG 24 hr tablet Take 25 mg by mouth daily.   Yes Historical Provider, MD  Multiple Vitamin (MULTIVITAMIN WITH MINERALS) TABS tablet Take 2 tablets by mouth daily.   Yes Historical Provider, MD  ondansetron (ZOFRAN) 8 MG tablet Take 1 tablet (8 mg total) by mouth 3 (three) times daily as needed for nausea or vomiting. 11/18/16  Yes Curt Bears, MD  pantoprazole (PROTONIX) 40 MG tablet Take 40 mg by mouth 2 (two) times daily before a meal.  11/27/16  Yes Historical Provider, MD  PROAIR HFA 108 (90 BASE) MCG/ACT inhaler Inhale 1-2 puffs into the lungs every 4 (four) hours as needed.  05/29/15  Yes Historical Provider, MD  triamterene-hydrochlorothiazide (DYAZIDE) 37.5-25 MG per capsule  Take 1 capsule by mouth daily.    Yes Historical Provider, MD    Physical Exam: Vitals:   01/04/17 1707 01/04/17 1708 01/04/17 1925  BP:   125/69  Pulse:   98  Resp:   21  SpO2:   100%  Weight:  79.4 kg (175 lb)   Height: '5\' 4"'$  (1.626 m)        Constitutional: No acute distress, calm, comfortable. Pale  Eyes: PERTLA, lids and conjunctivae normal ENMT: Mucous membranes are moist. Posterior pharynx clear of any exudate or lesions.   Neck: normal, supple, no masses, no thyromegaly Respiratory: clear to auscultation bilaterally, no wheezing, no crackles. Normal respiratory effort.   Cardiovascular: S1 & S2 heard, regular rate and rhythm. Trace pretibial edema bilaterally. No significant JVD. Abdomen: No distension, no tenderness, no masses palpated. Bowel sounds normal.  Musculoskeletal: no clubbing / cyanosis. No joint deformity upper and lower extremities.    Skin: no significant rashes, lesions, ulcers. Warm, dry, well-perfused. Neurologic: CN 2-12 grossly intact. Sensation intact, DTR normal. Strength 5/5 in all 4 limbs.  Psychiatric: Normal judgment and insight. Alert and oriented x 3. Normal mood and affect.     Labs on Admission: I have personally reviewed following labs and imaging studies  CBC:  Recent Labs Lab 12/31/16 0927 01/04/17 1734  WBC 1.0* 4.4  NEUTROABS 0.2* 2.6  HGB 9.1* 7.4*  HCT 26.8* 20.6*  MCV 85.6 80.2  PLT 130* 19*   Basic Metabolic Panel:  Recent Labs Lab 12/31/16 0927 01/04/17 1734  NA 137 139  K 3.3* 3.1*  CL 102 102  CO2 26 24  GLUCOSE 93 95  BUN 11 8  CREATININE 0.65 0.68  CALCIUM 8.2* 8.2*   GFR: Estimated Creatinine Clearance: 74.4 mL/min (by C-G formula based on SCr of 0.68 mg/dL). Liver Function Tests:  Recent Labs Lab 12/31/16 0927 01/04/17 1734  AST 16 18  ALT 14 11*  ALKPHOS 39 46  BILITOT 1.1 0.7  PROT 6.4* 6.5  ALBUMIN 3.5 3.5   No results for input(s): LIPASE, AMYLASE in the last 168 hours. No results for  input(s): AMMONIA in the last 168 hours. Coagulation Profile:  Recent Labs Lab 01/04/17 1734  INR 0.98   Cardiac Enzymes: No results for input(s): CKTOTAL, CKMB, CKMBINDEX, TROPONINI in the last 168 hours. BNP (last 3 results) No results for input(s): PROBNP in the last 8760 hours. HbA1C: No results for input(s): HGBA1C in the last 72 hours. CBG: No results for input(s): GLUCAP in the last 168 hours. Lipid Profile: No  results for input(s): CHOL, HDL, LDLCALC, TRIG, CHOLHDL, LDLDIRECT in the last 72 hours. Thyroid Function Tests: No results for input(s): TSH, T4TOTAL, FREET4, T3FREE, THYROIDAB in the last 72 hours. Anemia Panel: No results for input(s): VITAMINB12, FOLATE, FERRITIN, TIBC, IRON, RETICCTPCT in the last 72 hours. Urine analysis:    Component Value Date/Time   COLORURINE YELLOW 12/07/2016 1620   APPEARANCEUR CLEAR 12/07/2016 1620   LABSPEC 1.016 12/07/2016 1620   PHURINE 6.0 12/07/2016 1620   GLUCOSEU NEGATIVE 12/07/2016 1620   HGBUR NEGATIVE 12/07/2016 1620   BILIRUBINUR NEGATIVE 12/07/2016 1620   KETONESUR NEGATIVE 12/07/2016 1620   PROTEINUR NEGATIVE 12/07/2016 1620   UROBILINOGEN 0.2 03/10/2014 1027   NITRITE NEGATIVE 12/07/2016 1620   LEUKOCYTESUR NEGATIVE 12/07/2016 1620   Sepsis Labs: '@LABRCNTIP'$ (procalcitonin:4,lacticidven:4) )No results found for this or any previous visit (from the past 240 hour(s)).   Radiological Exams on Admission: No results found.  EKG: Independently reviewed. Sinus rhythm, PAC  Assessment/Plan  1. Symptomatic anemia  - Pt presents with marked generalized weakness and fatigue  - Hgb 7.4 on admission, down from 9.1 on Jan 30, and 11.7 Jan 22 - There is no melena or hematochezia reported and FOBT is negative - Suspect this is secondary to the antineoplastic therapies  - She was treated with Neulasta on 12/29/16  - Plan to transfuse 1 unit pRBCs and check post-transfusion CBC   2. Thrombocytopenia  - Platelets 19,000 on  admission with no bleeding or petechial rash  - Platelets were 130,000 on Jan 30  - Platelet count confirmed by smear; pt is not septic  - Likely secondary to chemotherapy  - Will monitor closely, transfuse if any bleeding, or if she spikes fever   3. Small cell lung cancer - Diagnosed October 2017  - Initially treated with cisplatin and etoposide, but did not tolerate - Now treated with carboplatin and etoposide, last infusion 12/27/16   4. COPD with chronic respiratory failure  - Stable on admission with no suggestion of exacerbation - Continue supplemental O2, inhalers   5. Hypokalemia  - Potassium 3.1 on admission, likely secondary to GI-losses given recent vomiting  - EKG okay  - She was given 20 mEq oral potassium and KCl added to her IVF - Continue Mag-Ox  - Repeat chem panel in am    6. Hypertension  - BP at goal on admission  - Continue Norvasc and Toprol as tolerated  - Triamterene-HCTZ held on admission given mild dehydration and electrolyte disturbance  7. GERD - EGD (June 201) with medium-sized hiatal hernia and non-erosive gastritis  - Managed with BID Protonix at home, will continue   8. Anxiety  - Stable, continue Prozac     DVT prophylaxis: SCD's  Code Status: Full  Family Communication: Discussed with patient Disposition Plan: Observe on med-surg Consults called: None Admission status: Observation    Vianne Bulls, MD [facility] Pager [phone]  If 7PM-7AM, please contact night-coverage [email or web address] Password [facility]  01/04/2017, 8:48 PM

## 2017-01-05 DIAGNOSIS — E039 Hypothyroidism, unspecified: Secondary | ICD-10-CM | POA: Diagnosis not present

## 2017-01-05 DIAGNOSIS — Z79891 Long term (current) use of opiate analgesic: Secondary | ICD-10-CM | POA: Diagnosis not present

## 2017-01-05 DIAGNOSIS — J9610 Chronic respiratory failure, unspecified whether with hypoxia or hypercapnia: Secondary | ICD-10-CM | POA: Diagnosis present

## 2017-01-05 DIAGNOSIS — D649 Anemia, unspecified: Secondary | ICD-10-CM | POA: Diagnosis not present

## 2017-01-05 DIAGNOSIS — Z881 Allergy status to other antibiotic agents status: Secondary | ICD-10-CM | POA: Diagnosis not present

## 2017-01-05 DIAGNOSIS — K219 Gastro-esophageal reflux disease without esophagitis: Secondary | ICD-10-CM | POA: Diagnosis present

## 2017-01-05 DIAGNOSIS — Z803 Family history of malignant neoplasm of breast: Secondary | ICD-10-CM | POA: Diagnosis not present

## 2017-01-05 DIAGNOSIS — Z801 Family history of malignant neoplasm of trachea, bronchus and lung: Secondary | ICD-10-CM | POA: Diagnosis not present

## 2017-01-05 DIAGNOSIS — D696 Thrombocytopenia, unspecified: Secondary | ICD-10-CM | POA: Diagnosis present

## 2017-01-05 DIAGNOSIS — J449 Chronic obstructive pulmonary disease, unspecified: Secondary | ICD-10-CM | POA: Diagnosis not present

## 2017-01-05 DIAGNOSIS — K449 Diaphragmatic hernia without obstruction or gangrene: Secondary | ICD-10-CM | POA: Diagnosis present

## 2017-01-05 DIAGNOSIS — C3492 Malignant neoplasm of unspecified part of left bronchus or lung: Secondary | ICD-10-CM | POA: Diagnosis present

## 2017-01-05 DIAGNOSIS — D709 Neutropenia, unspecified: Secondary | ICD-10-CM | POA: Diagnosis present

## 2017-01-05 DIAGNOSIS — Z923 Personal history of irradiation: Secondary | ICD-10-CM | POA: Diagnosis not present

## 2017-01-05 DIAGNOSIS — C3432 Malignant neoplasm of lower lobe, left bronchus or lung: Secondary | ICD-10-CM | POA: Diagnosis not present

## 2017-01-05 DIAGNOSIS — I10 Essential (primary) hypertension: Secondary | ICD-10-CM | POA: Diagnosis not present

## 2017-01-05 DIAGNOSIS — F419 Anxiety disorder, unspecified: Secondary | ICD-10-CM | POA: Diagnosis not present

## 2017-01-05 DIAGNOSIS — E860 Dehydration: Secondary | ICD-10-CM | POA: Diagnosis present

## 2017-01-05 DIAGNOSIS — Z885 Allergy status to narcotic agent status: Secondary | ICD-10-CM | POA: Diagnosis not present

## 2017-01-05 DIAGNOSIS — Z880 Allergy status to penicillin: Secondary | ICD-10-CM | POA: Diagnosis not present

## 2017-01-05 DIAGNOSIS — T451X5A Adverse effect of antineoplastic and immunosuppressive drugs, initial encounter: Secondary | ICD-10-CM | POA: Diagnosis present

## 2017-01-05 DIAGNOSIS — Z888 Allergy status to other drugs, medicaments and biological substances status: Secondary | ICD-10-CM | POA: Diagnosis not present

## 2017-01-05 DIAGNOSIS — Z87891 Personal history of nicotine dependence: Secondary | ICD-10-CM | POA: Diagnosis not present

## 2017-01-05 DIAGNOSIS — E876 Hypokalemia: Secondary | ICD-10-CM | POA: Diagnosis present

## 2017-01-05 LAB — CBC WITH DIFFERENTIAL/PLATELET
BASOS ABS: 0 10*3/uL (ref 0.0–0.1)
Basophils Relative: 0 %
EOS ABS: 0 10*3/uL (ref 0.0–0.7)
Eosinophils Relative: 0 %
HCT: 25.5 % — ABNORMAL LOW (ref 36.0–46.0)
Hemoglobin: 8.7 g/dL — ABNORMAL LOW (ref 12.0–15.0)
LYMPHS ABS: 0.8 10*3/uL (ref 0.7–4.0)
LYMPHS PCT: 17 %
MCH: 27.6 pg (ref 26.0–34.0)
MCHC: 34.1 g/dL (ref 30.0–36.0)
MCV: 81 fL (ref 78.0–100.0)
Monocytes Absolute: 1 10*3/uL (ref 0.1–1.0)
Monocytes Relative: 21 %
NEUTROS ABS: 2.8 10*3/uL (ref 1.7–7.7)
Neutrophils Relative %: 62 %
Platelets: 19 10*3/uL — CL (ref 150–400)
RBC: 3.15 MIL/uL — AB (ref 3.87–5.11)
RDW: 16 % — AB (ref 11.5–15.5)
WBC: 4.6 10*3/uL (ref 4.0–10.5)

## 2017-01-05 LAB — BASIC METABOLIC PANEL
ANION GAP: 8 (ref 5–15)
BUN: 5 mg/dL — AB (ref 6–20)
CO2: 27 mmol/L (ref 22–32)
Calcium: 7.9 mg/dL — ABNORMAL LOW (ref 8.9–10.3)
Chloride: 105 mmol/L (ref 101–111)
Creatinine, Ser: 0.5 mg/dL (ref 0.44–1.00)
GFR calc Af Amer: >60 mL/min (ref >60)
GLUCOSE: 87 mg/dL (ref 65–99)
POTASSIUM: 3.3 mmol/L — AB (ref 3.5–5.1)
Sodium: 140 mmol/L (ref 135–145)

## 2017-01-05 LAB — CBC
HEMATOCRIT: 22.4 % — AB (ref 36.0–46.0)
HEMOGLOBIN: 7.8 g/dL — AB (ref 12.0–15.0)
MCH: 28 pg (ref 26.0–34.0)
MCHC: 34.8 g/dL (ref 30.0–36.0)
MCV: 80.3 fL (ref 78.0–100.0)
Platelets: 13 10*3/uL — CL (ref 150–400)
RBC: 2.79 MIL/uL — ABNORMAL LOW (ref 3.87–5.11)
RDW: 15.8 % — ABNORMAL HIGH (ref 11.5–15.5)
WBC: 4.5 10*3/uL (ref 4.0–10.5)

## 2017-01-05 LAB — MAGNESIUM: MAGNESIUM: 1.2 mg/dL — AB (ref 1.7–2.4)

## 2017-01-05 LAB — PREPARE RBC (CROSSMATCH)

## 2017-01-05 MED ORDER — DIPHENHYDRAMINE HCL 50 MG/ML IJ SOLN
25.0000 mg | Freq: Once | INTRAMUSCULAR | Status: AC
  Administered 2017-01-05: 25 mg via INTRAVENOUS
  Filled 2017-01-05: qty 1

## 2017-01-05 MED ORDER — SODIUM CHLORIDE 0.9 % IV SOLN
Freq: Once | INTRAVENOUS | Status: AC
  Administered 2017-01-05 – 2017-01-06 (×2): 250 mL via INTRAVENOUS

## 2017-01-05 MED ORDER — ACETAMINOPHEN 325 MG PO TABS
650.0000 mg | ORAL_TABLET | Freq: Once | ORAL | Status: AC
  Administered 2017-01-05: 650 mg via ORAL
  Filled 2017-01-05: qty 2

## 2017-01-05 NOTE — Progress Notes (Signed)
PROGRESS NOTE  VASTI YAGI UKG:[id number] DOB: November 01, 1956 DOA: 01/04/2017 PCP: Glo Herring., MD   LOS: 0 days   Brief Narrative: 61 y.o. 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 Patient notes increased generalized weakness and fatigue going back approximately 2 weeks, but worsening more significantly over the past week. She is currently undergoing chemotherapy as well as radiation therapy.  Assessment & Plan: Principal Problem:   Symptomatic anemia Active Problems:   COPD (chronic obstructive pulmonary disease) (HCC)   Hypertension   GERD (gastroesophageal reflux disease)   Hypothyroidism   Anxiety   Small cell lung cancer, left (HCC)   Hypokalemia   Thrombocytopenia (HCC)   Symptomatic anemia - This is likely in the setting of ongoing chemotherapy, no evidence of bleed. Transfuse 1 unit of packed red blood cells overnight, hemoglobin improved from 7.4-7.8 this morning, less than ideal response, we'll transfuse another additional unit as she is still feeling quite weak.  Thrombocytopenia - Platelets of 19,000 on admission from 113,005 visit ago, repeat this morning is 13,000, concern about trajectory and the fact that repeat CBC will likely be less than 10,000, we'll transfuse 1 unit of platelets - No bleeding  Small cell lung cancer - Currently undergoing radiation therapy, I supposed have the next chemotherapy cycle on 2/13. Defer whether this is to be postponed to Dr. Julien Nordmann with oncology. - She is supposed to get a CT chest with contrast tomorrow, we'll order one while she is here  COPD with chronic respiratory failure - Baseline, no wheezing, continue home medications  Hypokalemia  - Potassium 3.1 on admission, likely secondary to GI-losses given recent vomiting.  Replete  Hypertension  - BP at goal - Continue Norvasc and Toprol  GERD - EGD (June 201) with medium-sized hiatal hernia and non-erosive gastritis  - Managed with BID Protonix at home, will continue   Anxiety  - Stable, continue Prozac     DVT prophylaxis: SCD Code Status: Full code Family Communication: no family bedside Disposition Plan: home 1-2 days pending counts improvement  Consultants:   None   Procedures:   None   Antimicrobials:  None    Subjective: - Continues to feel weak this morning, she denies any chest pain, denies abdominal pain, nausea, vomiting or diarrhea.  Objective: Vitals:   01/04/17 2149 01/04/17 2236 01/04/17 2307 01/05/17 0516  BP: (!) 145/76 102/90 (!) 99/58 113/78  Pulse: 88 98 86 92  Resp: '20 20 20 18  '$ Temp: 97.8 F (36.6 C) 98.2 F (36.8 C) 98 F (36.7 C) 98.1 F (36.7 C)  TempSrc: Oral Oral Oral Oral  SpO2: 95% 98% 93% 95%  Weight: 79 kg (174 lb 2.6 oz)     Height: '5\' 3"'$  (1.6 m)       Intake/Output Summary (Last 24 hours) at 01/05/17 1018 Last data filed at 01/05/17 0806  Gross per 24 hour  Intake          1563.33 ml  Output             1050 ml  Net           513.33 ml   Filed Weights   01/04/17 1708 01/04/17 2149  Weight: 79.4 kg (175 lb) 79 kg (174 lb 2.6 oz)    Examination: Constitutional: NAD, appears pale Vitals:   01/04/17 2149 01/04/17 2236 01/04/17  2307 01/05/17 0516  BP: (!) 145/76 102/90 (!) 99/58 113/78  Pulse: 88 98 86 92  Resp: '20 20 20 18  '$ Temp: 97.8 F (36.6 C) 98.2 F (36.8 C) 98 F (36.7 C) 98.1 F (36.7 C)  TempSrc: Oral Oral Oral Oral  SpO2: 95% 98% 93% 95%  Weight: 79 kg (174 lb 2.6 oz)     Height: '5\' 3"'$  (1.6 m)      Eyes: lids and conjunctivae pale ENMT: Mucous membranes are moist. No oropharyngeal exudates Respiratory: clear to auscultation bilaterally, no wheezing, no crackles.  Cardiovascular: Regular rate and rhythm, no murmurs / rubs / gallops. No LE edema.  Abdomen: no  tenderness. Bowel sounds positive.  Neurologic: Nonfocal   Data Reviewed: I have personally reviewed following labs and imaging studies  CBC:  Recent Labs Lab 12/31/16 0927 01/04/17 1734 01/05/17 0436  WBC 1.0* 4.4 4.5  NEUTROABS 0.2* 2.6  --   HGB 9.1* 7.4* 7.8*  HCT 26.8* 20.6* 22.4*  MCV 85.6 80.2 80.3  PLT 130* 19* 13*   Basic Metabolic Panel:  Recent Labs Lab 12/31/16 0927 01/04/17 1734 01/05/17 0436  NA 137 139 140  K 3.3* 3.1* 3.3*  CL 102 102 105  CO2 '26 24 27  '$ GLUCOSE 93 95 87  BUN 11 8 5*  CREATININE 0.65 0.68 0.50  CALCIUM 8.2* 8.2* 7.9*  MG  --   --  1.2*   GFR: Estimated Creatinine Clearance: 72.5 mL/min (by C-G formula based on SCr of 0.5 mg/dL). Liver Function Tests:  Recent Labs Lab 12/31/16 0927 01/04/17 1734  AST 16 18  ALT 14 11*  ALKPHOS 39 46  BILITOT 1.1 0.7  PROT 6.4* 6.5  ALBUMIN 3.5 3.5   No results for input(s): LIPASE, AMYLASE in the last 168 hours. No results for input(s): AMMONIA in the last 168 hours. Coagulation Profile:  Recent Labs Lab 01/04/17 1734  INR 0.98   Cardiac Enzymes: No results for input(s): CKTOTAL, CKMB, CKMBINDEX, TROPONINI in the last 168 hours. BNP (last 3 results) No results for input(s): PROBNP in the last 8760 hours. HbA1C: No results for input(s): HGBA1C in the last 72 hours. CBG: No results for input(s): GLUCAP in the last 168 hours. Lipid Profile: No results for input(s): CHOL, HDL, LDLCALC, TRIG, CHOLHDL, LDLDIRECT in the last 72 hours. Thyroid Function Tests: No results for input(s): TSH, T4TOTAL, FREET4, T3FREE, THYROIDAB in the last 72 hours. Anemia Panel: No results for input(s): VITAMINB12, FOLATE, FERRITIN, TIBC, IRON, RETICCTPCT in the last 72 hours. Urine analysis:    Component Value Date/Time   COLORURINE YELLOW 12/07/2016 1620   APPEARANCEUR CLEAR 12/07/2016 1620   LABSPEC 1.016 12/07/2016 1620   PHURINE 6.0 12/07/2016 1620   GLUCOSEU NEGATIVE 12/07/2016 1620   HGBUR  NEGATIVE 12/07/2016 1620   BILIRUBINUR NEGATIVE 12/07/2016 1620   KETONESUR NEGATIVE 12/07/2016 1620   PROTEINUR NEGATIVE 12/07/2016 1620   UROBILINOGEN 0.2 03/10/2014 1027   NITRITE NEGATIVE 12/07/2016 1620   LEUKOCYTESUR NEGATIVE 12/07/2016 1620   Sepsis Labs: Invalid input(s): PROCALCITONIN, LACTICIDVEN  No results found for this or any previous visit (from the past 240 hour(s)).    Radiology Studies: No results found.   Scheduled Meds: . sodium chloride   Intravenous Once  . amLODipine  5 mg Oral Daily  . estradiol  2 mg Oral Daily  . FLUoxetine  40 mg Oral Daily  . levothyroxine  25 mcg Oral QAC breakfast  . magnesium oxide  400 mg Oral TID  .  metoprolol succinate  25 mg Oral Daily  . multivitamin with minerals  1 tablet Oral Daily  . pantoprazole  40 mg Oral BID AC   Continuous Infusions:    Marzetta Board, MD, PhD [facility] Pager [phone]  If 7PM-7AM, please contact night-coverage [email or web address] Password [email or web address] 01/05/2017, 10:18 AM

## 2017-01-06 ENCOUNTER — Inpatient Hospital Stay (HOSPITAL_COMMUNITY): Payer: Medicare Other

## 2017-01-06 ENCOUNTER — Encounter (HOSPITAL_COMMUNITY): Payer: Self-pay | Admitting: Radiology

## 2017-01-06 ENCOUNTER — Other Ambulatory Visit: Payer: Medicare Other

## 2017-01-06 ENCOUNTER — Ambulatory Visit (HOSPITAL_COMMUNITY): Admission: RE | Admit: 2017-01-06 | Payer: Medicare Other | Source: Ambulatory Visit

## 2017-01-06 DIAGNOSIS — D649 Anemia, unspecified: Principal | ICD-10-CM

## 2017-01-06 DIAGNOSIS — F419 Anxiety disorder, unspecified: Secondary | ICD-10-CM

## 2017-01-06 LAB — CBC WITH DIFFERENTIAL/PLATELET
BASOS ABS: 0.1 10*3/uL (ref 0.0–0.1)
Basophils Relative: 1 %
EOS ABS: 0 10*3/uL (ref 0.0–0.7)
Eosinophils Relative: 0 %
HCT: 27.2 % — ABNORMAL LOW (ref 36.0–46.0)
Hemoglobin: 9.4 g/dL — ABNORMAL LOW (ref 12.0–15.0)
LYMPHS ABS: 1 10*3/uL (ref 0.7–4.0)
LYMPHS PCT: 19 %
MCH: 27.9 pg (ref 26.0–34.0)
MCHC: 34.6 g/dL (ref 30.0–36.0)
MCV: 80.7 fL (ref 78.0–100.0)
MONO ABS: 0.8 10*3/uL (ref 0.1–1.0)
Monocytes Relative: 14 %
NEUTROS ABS: 3.6 10*3/uL (ref 1.7–7.7)
Neutrophils Relative %: 66 %
PLATELETS: 21 10*3/uL — AB (ref 150–400)
RBC: 3.37 MIL/uL — ABNORMAL LOW (ref 3.87–5.11)
RDW: 16 % — AB (ref 11.5–15.5)
WBC: 5.5 10*3/uL (ref 4.0–10.5)

## 2017-01-06 LAB — PREPARE PLATELET PHERESIS
BLOOD PRODUCT EXPIRATION DATE: 201802042359
ISSUE DATE / TIME: 201802041148
Unit Type and Rh: 6200

## 2017-01-06 LAB — COMPREHENSIVE METABOLIC PANEL
ALT: 13 U/L — ABNORMAL LOW (ref 14–54)
AST: 18 U/L (ref 15–41)
Albumin: 3.3 g/dL — ABNORMAL LOW (ref 3.5–5.0)
Alkaline Phosphatase: 46 U/L (ref 38–126)
Anion gap: 9 (ref 5–15)
BUN: <5 mg/dL — ABNORMAL LOW (ref 6–20)
CHLORIDE: 105 mmol/L (ref 101–111)
CO2: 25 mmol/L (ref 22–32)
Calcium: 8.1 mg/dL — ABNORMAL LOW (ref 8.9–10.3)
Creatinine, Ser: 0.6 mg/dL (ref 0.44–1.00)
Glucose, Bld: 93 mg/dL (ref 65–99)
POTASSIUM: 3.4 mmol/L — AB (ref 3.5–5.1)
Sodium: 139 mmol/L (ref 135–145)
Total Bilirubin: 0.6 mg/dL (ref 0.3–1.2)
Total Protein: 6 g/dL — ABNORMAL LOW (ref 6.5–8.1)

## 2017-01-06 LAB — TYPE AND SCREEN
BLOOD PRODUCT EXPIRATION DATE: 201803022359
Blood Product Expiration Date: 201803042359
ISSUE DATE / TIME: 201802041429
ISSUE DATE / TIME: 201802032237
UNIT TYPE AND RH: 5100
UNIT TYPE AND RH: 5100

## 2017-01-06 LAB — MAGNESIUM: Magnesium: 1.3 mg/dL — ABNORMAL LOW (ref 1.7–2.4)

## 2017-01-06 LAB — PHOSPHORUS: PHOSPHORUS: 2.4 mg/dL — AB (ref 2.5–4.6)

## 2017-01-06 MED ORDER — POTASSIUM CHLORIDE CRYS ER 20 MEQ PO TBCR: 40.0000 meq | EXTENDED_RELEASE_TABLET | Freq: Every day | ORAL | 2 refills | Status: DC

## 2017-01-06 MED ORDER — POTASSIUM CHLORIDE CRYS ER 20 MEQ PO TBCR
40.0000 meq | EXTENDED_RELEASE_TABLET | Freq: Two times a day (BID) | ORAL | Status: DC
  Administered 2017-01-06: 40 meq via ORAL
  Filled 2017-01-06: qty 2

## 2017-01-06 MED ORDER — MAGNESIUM SULFATE 2 GM/50ML IV SOLN
2.0000 g | Freq: Once | INTRAVENOUS | Status: AC
Start: 2017-01-06 — End: 2017-01-06
  Administered 2017-01-06: 2 g via INTRAVENOUS
  Filled 2017-01-06: qty 50

## 2017-01-06 MED ORDER — SODIUM CHLORIDE 0.9 % IJ SOLN
INTRAMUSCULAR | Status: AC
  Administered 2017-01-06: 250 mL via INTRAVENOUS
  Filled 2017-01-06: qty 50

## 2017-01-06 MED ORDER — IOPAMIDOL (ISOVUE-300) INJECTION 61%
INTRAVENOUS | Status: AC
  Administered 2017-01-06: 75 mL
  Filled 2017-01-06: qty 75

## 2017-01-06 MED ORDER — MAGNESIUM OXIDE 400 (241.3 MG) MG PO TABS: 800.0000 mg | ORAL_TABLET | Freq: Two times a day (BID) | ORAL | 3 refills | Status: DC

## 2017-01-06 NOTE — Discharge Summary (Signed)
Physician Discharge Summary  Rebekah Henderson MRN: [id number] DOB/AGE: [birth year] 61 y.o.  PCP: Glo Herring., MD   Admit date: 01/04/2017 Discharge date: 01/06/2017  Discharge Diagnoses:    Principal Problem:   Symptomatic anemia Active Problems:   COPD (chronic obstructive pulmonary disease) (HCC)   Hypertension   GERD (gastroesophageal reflux disease)   Hypothyroidism   Anxiety   Small cell lung cancer, left (HCC)   Hypokalemia   Thrombocytopenia (HCC)    Follow-up recommendations Follow-up with PCP in 3-5 days , including all  additional recommended appointments as below Follow-up CBC, CMP, magnesium in 3-5 days Aspirin held due to low platelets Triamterene/HCTZ held due to blood pressure being reasonably controlled, electrolyte abnormalities Patient to follow-up with oncology to discuss results of her CT scan      Current Discharge Medication List    START taking these medications   Details  potassium chloride SA (K-DUR,KLOR-CON) 20 MEQ tablet Take 2 tablets (40 mEq total) by mouth daily. Qty: 30 tablet, Refills: 2      CONTINUE these medications which have CHANGED   Details  magnesium oxide (MAG-OX) 400 (241.3 Mg) MG tablet Take 2 tablets (800 mg total) by mouth 2 (two) times daily. Qty: 120 tablet, Refills: 3   Associated Diagnoses: Hypomagnesemia; Small cell lung cancer, left ([place]); Essential hypertension; Chronic obstructive pulmonary disease, unspecified COPD type ([place]); Nodular rash; Rash and nonspecific skin eruption      CONTINUE these medications which have NOT CHANGED   Details  albuterol (PROVENTIL) 2 MG tablet Take 2 mg by mouth daily as needed for wheezing or shortness of breath.     amLODipine (NORVASC) 5 MG tablet Take 5 mg by mouth daily.      Dicyclomine HCl (BENTYL PO) Take 1 tablet by mouth 3 (three) times daily as needed (IBS).    estazolam (PROSOM) 2 MG tablet Take 2 mg by mouth at bedtime.    estradiol (ESTRACE) 2 MG tablet Take 2 mg  by mouth daily.      FLUoxetine (PROZAC) 40 MG capsule Take 40 mg by mouth daily.  Refills: 2    HYDROcodone-acetaminophen (NORCO/VICODIN) 5-325 MG tablet Take 1 tablet by mouth every 4 (four) hours as needed for moderate pain.  Refills: 0   Associated Diagnoses: Small cell lung cancer, left ([place]); Chest pain in adult; Tachycardia    levothyroxine (SYNTHROID, LEVOTHROID) 25 MCG tablet Take 25 mcg by mouth daily before breakfast.     loperamide (IMODIUM) 2 MG capsule Take 2 mg by mouth every 2 (two) hours as needed for diarrhea or loose stools.    metoprolol succinate (TOPROL-XL) 25 MG 24 hr tablet Take 25 mg by mouth daily.    Multiple Vitamin (MULTIVITAMIN WITH MINERALS) TABS tablet Take 2 tablets by mouth daily.    ondansetron (ZOFRAN) 8 MG tablet Take 1 tablet (8 mg total) by mouth 3 (three) times daily as needed for nausea or vomiting. Qty: 30 tablet, Refills: 0   Associated Diagnoses: Nausea and vomiting, intractability of vomiting not specified, unspecified vomiting type; SOB (shortness of breath)    pantoprazole (PROTONIX) 40 MG tablet Take 40 mg by mouth 2 (two) times daily before a meal.  Refills: 10   Associated Diagnoses: Small cell lung cancer, left ([place]); Encounter for antineoplastic chemotherapy; Rash and nonspecific skin eruption; Chronic obstructive pulmonary disease, unspecified COPD type ([place])    PROAIR HFA 108 (90 BASE) MCG/ACT inhaler Inhale 1-2 puffs into the lungs every 4 (four) hours as needed.  Refills: 0      STOP taking these medications     aspirin EC 81 MG tablet      triamterene-hydrochlorothiazide (DYAZIDE) 37.5-25 MG per capsule          Discharge Condition: Stable    Discharge Instructions Get Medicines reviewed and adjusted: Please take all your medications with you for your next visit with your Primary MD  Please request your Primary MD to go over all hospital tests and procedure/radiological results at the follow up, please ask your  Primary MD to get all Hospital records sent to his/her office.  If you experience worsening of your admission symptoms, develop shortness of breath, life threatening emergency, suicidal or homicidal thoughts you must seek medical attention immediately by calling 911 or calling your MD immediately if symptoms less severe.  You must read complete instructions/literature along with all the possible adverse reactions/side effects for all the Medicines you take and that have been prescribed to you. Take any new Medicines after you have completely understood and accpet all the possible adverse reactions/side effects.   Do not drive when taking Pain medications.   Do not take more than prescribed Pain, Sleep and Anxiety Medications  Special Instructions: If you have smoked or chewed Tobacco in the last 2 yrs please stop smoking, stop any regular Alcohol and or any Recreational drug use.  Wear Seat belts while driving.  Please note  You were cared for by a hospitalist during your hospital stay. Once you are discharged, your primary care physician will handle any further medical issues. Please note that NO REFILLS for any discharge medications will be authorized once you are discharged, as it is imperative that you return to your primary care physician (or establish a relationship with a primary care physician if you do not have one) for your aftercare needs so that they can reassess your need for medications and monitor your lab values.     Allergies  Allergen Reactions  . Penicillins Hives and Swelling    Has patient had a PCN reaction causing immediate rash, facial/tongue/throat swelling, SOB or lightheadedness with hypotension:unsure  Has patient had a PCN reaction causing severe rash involving mucus membranes or skin necrosis:unsure Has patient had a PCN reaction that required hospitalization:No Has patient had a PCN reaction occurring within the last 10 years:No If all of the above answers  are "NO", then may proceed with Cephalosporin use.   . Codeine Nausea Only  . Ranitidine Hcl Other (See Comments)    dizzy  . Keflex [Cephalexin] Other (See Comments)    UNSPECIFIED REACTION   . Lyrica [Pregabalin] Other (See Comments)    lethargic      Disposition: 01-Home or Self Care   Consults:  Phone consultation with Dr. Julien Nordmann    Significant Diagnostic Studies:  Dg Chest Port 1 View  Result Date: 12/07/2016 CLINICAL DATA:  Shortness of Breath EXAM: PORTABLE CHEST 1 VIEW COMPARISON:  10/19/2016 FINDINGS: The heart size and mediastinal contours are within normal limits. Both lungs are clear. The visualized skeletal structures are unremarkable. Postsurgical changes cervical spine is noted. IMPRESSION: No active disease. Electronically Signed   By: Inez Catalina M.D.   On: 12/07/2016 13:38        Filed Weights   01/04/17 1708 01/04/17 2149  Weight: 79.4 kg (175 lb) 79 kg (174 lb 2.6 oz)     Microbiology: Recent Results (from the past 240 hour(s))  Culture, blood (routine x 2)     Status:  None (Preliminary result)   Collection Time: 01/04/17  5:34 PM  Result Value Ref Range Status   Specimen Description BLOOD LEFT WRIST  Final   Special Requests BOTTLES DRAWN AEROBIC AND ANAEROBIC 5CC  Final   Culture   Final    NO GROWTH < 24 HOURS Performed at [facility], [place] [address], [place], [place] [address]    Report Status PENDING  Incomplete  Culture, blood (routine x 2)     Status: None (Preliminary result)   Collection Time: 01/04/17  5:39 PM  Result Value Ref Range Status   Specimen Description BLOOD RIGHT [facility]  Final   Special Requests BOTTLES DRAWN AEROBIC AND ANAEROBIC 2CC  Final   Culture   Final    NO GROWTH < 24 HOURS Performed at [facility], [place] [address] [place], [place] [address]    Report Status PENDING  Incomplete       Blood Culture    Component Value Date/Time   SDES BLOOD RIGHT AC 01/04/2017 1739   SPECREQUEST BOTTLES  DRAWN AEROBIC AND ANAEROBIC 2CC 01/04/2017 1739   CULT  01/04/2017 1739    NO GROWTH < 24 HOURS Performed at [facility], [place] [address] [place], [place] [address]    REPTSTATUS PENDING 01/04/2017 1739      Labs: Results for orders placed or performed during the hospital encounter of 01/04/17 (from the past 48 hour(s))  Comprehensive metabolic panel     Status: Abnormal   Collection Time: 01/04/17  5:34 PM  Result Value Ref Range   Sodium 139 135 - 145 mmol/L   Potassium 3.1 (L) 3.5 - 5.1 mmol/L   Chloride 102 101 - 111 mmol/L   CO2 24 22 - 32 mmol/L   Glucose, Bld 95 65 - 99 mg/dL   BUN 8 6 - 20 mg/dL   Creatinine, Ser 0.68 0.44 - 1.00 mg/dL   Calcium 8.2 (L) 8.9 - 10.3 mg/dL   Total Protein 6.5 6.5 - 8.1 g/dL   Albumin 3.5 3.5 - 5.0 g/dL   AST 18 15 - 41 U/L   ALT 11 (L) 14 - 54 U/L   Alkaline Phosphatase 46 38 - 126 U/L   Total Bilirubin 0.7 0.3 - 1.2 mg/dL   GFR calc non Af Amer >60 >60 mL/min   GFR calc Af Amer >60 >60 mL/min    Comment: (NOTE) The eGFR has been calculated using the CKD EPI equation. This calculation has not been validated in all clinical situations. eGFR's persistently <60 mL/min signify possible Chronic Kidney Disease.    Anion gap 13 5 - 15  CBC with Differential     Status: Abnormal   Collection Time: 01/04/17  5:34 PM  Result Value Ref Range   WBC 4.4 4.0 - 10.5 K/uL   RBC 2.57 (L) 3.87 - 5.11 MIL/uL   Hemoglobin 7.4 (L) 12.0 - 15.0 g/dL   HCT 20.6 (L) 36.0 - 46.0 %   MCV 80.2 78.0 - 100.0 fL   MCH 28.8 26.0 - 34.0 pg   MCHC 35.9 30.0 - 36.0 g/dL   RDW 16.6 (H) 11.5 - 15.5 %   Platelets 19 (LL) 150 - 400 K/uL    Comment: SPECIMEN CHECKED FOR CLOTS REPEATED TO VERIFY CRITICAL RESULT CALLED TO, READ BACK BY AND VERIFIED WITH: WEST,S RN AT 1833 ON 2.3.18 BY EPPERSON,S    Neutrophils Relative % 58 %   Lymphocytes Relative 22 %   Monocytes Relative  18 %   Eosinophils Relative 1 %   Basophils Relative 1 %   Neutro Abs 2.6 1.7 -  7.7 K/uL   Lymphs Abs 1.0 0.7 - 4.0 K/uL   Monocytes Absolute 0.8 0.1 - 1.0 K/uL   Eosinophils Absolute 0.0 0.0 - 0.7 K/uL   Basophils Absolute 0.0 0.0 - 0.1 K/uL   WBC Morphology ATYPICAL LYMPHOCYTES    Smear Review PLATELET COUNT CONFIRMED BY SMEAR   Protime-INR     Status: None   Collection Time: 01/04/17  5:34 PM  Result Value Ref Range   Prothrombin Time 13.0 11.4 - 15.2 seconds   INR 0.98   Type and screen [place]     Status: None   Collection Time: 01/04/17  5:34 PM  Result Value Ref Range   ISSUE DATE / TIME [id number]    Blood Product Unit Number [id number]    PRODUCT CODE [id number]    Unit Type and Rh 5100    Blood Product Expiration Date [id number]    ISSUE DATE / TIME [id number]    Blood Product Unit Number [id number]    PRODUCT CODE [id number]    Unit Type and Rh 5100    Blood Product Expiration Date [id number]   Culture, blood (routine x 2)     Status: None (Preliminary result)   Collection Time: 01/04/17  5:34 PM  Result Value Ref Range   Specimen Description BLOOD LEFT WRIST    Special Requests BOTTLES DRAWN AEROBIC AND ANAEROBIC 5CC    Culture      NO GROWTH < 24 HOURS Performed at [facility], [place] [address], [place], [place] [address]    Report Status PENDING   Culture, blood (routine x 2)     Status: None (Preliminary result)   Collection Time: 01/04/17  5:39 PM  Result Value Ref Range   Specimen Description BLOOD RIGHT AC    Special Requests BOTTLES DRAWN AEROBIC AND ANAEROBIC 2CC    Culture      NO GROWTH < 24 HOURS Performed at [facility], [place] [address], [place], [place] [address]    Report Status PENDING   I-stat troponin, ED     Status: None   Collection Time: 01/04/17  6:15 PM  Result Value Ref Range   Troponin i, poc 0.00 0.00 - 0.08 ng/mL   Comment 3            Comment: Due to the release kinetics of cTnI, a negative result within the first hours of the onset of symptoms does not rule  out myocardial infarction with certainty. If myocardial infarction is still suspected, repeat the test at appropriate intervals.   POC occult blood, ED Provider will collect     Status: None   Collection Time: 01/04/17  7:19 PM  Result Value Ref Range   Fecal Occult Bld NEGATIVE NEGATIVE  Prepare RBC     Status: None   Collection Time: 01/04/17  9:00 PM  Result Value Ref Range   Order Confirmation ORDER PROCESSED BY BLOOD BANK   Magnesium     Status: Abnormal   Collection Time: 01/05/17  4:36 AM  Result Value Ref Range   Magnesium 1.2 (L) 1.7 - 2.4 mg/dL  Basic metabolic panel     Status: Abnormal   Collection Time: 01/05/17  4:36 AM  Result Value Ref Range   Sodium 140 135 - 145 mmol/L   Potassium 3.3 (L) 3.5 - 5.1 mmol/L  Chloride 105 101 - 111 mmol/L   CO2 27 22 - 32 mmol/L   Glucose, Bld 87 65 - 99 mg/dL   BUN 5 (L) 6 - 20 mg/dL   Creatinine, Ser 0.50 0.44 - 1.00 mg/dL   Calcium 7.9 (L) 8.9 - 10.3 mg/dL   GFR calc non Af Amer >60 >60 mL/min   GFR calc Af Amer >60 >60 mL/min    Comment: (NOTE) The eGFR has been calculated using the CKD EPI equation. This calculation has not been validated in all clinical situations. eGFR's persistently <60 mL/min signify possible Chronic Kidney Disease.    Anion gap 8 5 - 15  CBC     Status: Abnormal   Collection Time: 01/05/17  4:36 AM  Result Value Ref Range   WBC 4.5 4.0 - 10.5 K/uL   RBC 2.79 (L) 3.87 - 5.11 MIL/uL   Hemoglobin 7.8 (L) 12.0 - 15.0 g/dL   HCT 22.4 (L) 36.0 - 46.0 %   MCV 80.3 78.0 - 100.0 fL   MCH 28.0 26.0 - 34.0 pg   MCHC 34.8 30.0 - 36.0 g/dL   RDW 15.8 (H) 11.5 - 15.5 %   Platelets 13 (LL) 150 - 400 K/uL    Comment: CONSISTENT WITH PREVIOUS RESULT CRITICAL VALUE NOTED.  VALUE IS CONSISTENT WITH PREVIOUSLY REPORTED AND CALLED VALUE.   Prepare Pheresed Platelets     Status: None   Collection Time: 01/05/17 10:14 AM  Result Value Ref Range   ISSUE DATE / TIME [id number]    Blood Product Unit Number  [id number]    PRODUCT CODE [id number]    Unit Type and Rh 6200    Blood Product Expiration Date [id number]   Prepare RBC     Status: None   Collection Time: 01/05/17 10:14 AM  Result Value Ref Range   Order Confirmation ORDER PROCESSED BY BLOOD BANK   CBC with Differential/Platelet     Status: Abnormal   Collection Time: 01/05/17  6:31 PM  Result Value Ref Range   WBC 4.6 4.0 - 10.5 K/uL   RBC 3.15 (L) 3.87 - 5.11 MIL/uL   Hemoglobin 8.7 (L) 12.0 - 15.0 g/dL   HCT 25.5 (L) 36.0 - 46.0 %   MCV 81.0 78.0 - 100.0 fL   MCH 27.6 26.0 - 34.0 pg   MCHC 34.1 30.0 - 36.0 g/dL   RDW 16.0 (H) 11.5 - 15.5 %   Platelets 19 (LL) 150 - 400 K/uL    Comment: CONSISTENT WITH PREVIOUS RESULT CRITICAL VALUE NOTED.  VALUE IS CONSISTENT WITH PREVIOUSLY REPORTED AND CALLED VALUE.    Neutrophils Relative % 62 %   Lymphocytes Relative 17 %   Monocytes Relative 21 %   Eosinophils Relative 0 %   Basophils Relative 0 %   Neutro Abs 2.8 1.7 - 7.7 K/uL   Lymphs Abs 0.8 0.7 - 4.0 K/uL   Monocytes Absolute 1.0 0.1 - 1.0 K/uL   Eosinophils Absolute 0.0 0.0 - 0.7 K/uL   Basophils Absolute 0.0 0.0 - 0.1 K/uL   WBC Morphology MILD LEFT SHIFT (1-5% METAS, OCC MYELO, OCC BANDS)   CBC with Differential/Platelet     Status: Abnormal   Collection Time: 01/06/17  4:26 AM  Result Value Ref Range   WBC 5.5 4.0 - 10.5 K/uL   RBC 3.37 (L) 3.87 - 5.11 MIL/uL   Hemoglobin 9.4 (L) 12.0 - 15.0 g/dL   HCT 27.2 (L) 36.0 - 46.0 %   MCV 80.7 78.0 -  100.0 fL   MCH 27.9 26.0 - 34.0 pg   MCHC 34.6 30.0 - 36.0 g/dL   RDW 16.0 (H) 11.5 - 15.5 %   Platelets 21 (LL) 150 - 400 K/uL    Comment: CRITICAL VALUE NOTED.  VALUE IS CONSISTENT WITH PREVIOUSLY REPORTED AND CALLED VALUE.   Neutrophils Relative % 66 %   Lymphocytes Relative 19 %   Monocytes Relative 14 %   Eosinophils Relative 0 %   Basophils Relative 1 %   Neutro Abs 3.6 1.7 - 7.7 K/uL   Lymphs Abs 1.0 0.7 - 4.0 K/uL   Monocytes Absolute 0.8 0.1 - 1.0 K/uL    Eosinophils Absolute 0.0 0.0 - 0.7 K/uL   Basophils Absolute 0.1 0.0 - 0.1 K/uL   WBC Morphology TOXIC GRANULATION     Comment: MILD LEFT SHIFT (1-5% METAS, OCC MYELO, OCC BANDS)  Phosphorus     Status: Abnormal   Collection Time: 01/06/17  4:26 AM  Result Value Ref Range   Phosphorus 2.4 (L) 2.5 - 4.6 mg/dL  Magnesium     Status: Abnormal   Collection Time: 01/06/17  4:26 AM  Result Value Ref Range   Magnesium 1.3 (L) 1.7 - 2.4 mg/dL  Comprehensive metabolic panel     Status: Abnormal   Collection Time: 01/06/17  4:26 AM  Result Value Ref Range   Sodium 139 135 - 145 mmol/L   Potassium 3.4 (L) 3.5 - 5.1 mmol/L   Chloride 105 101 - 111 mmol/L   CO2 25 22 - 32 mmol/L   Glucose, Bld 93 65 - 99 mg/dL   BUN <5 (L) 6 - 20 mg/dL   Creatinine, Ser 0.60 0.44 - 1.00 mg/dL   Calcium 8.1 (L) 8.9 - 10.3 mg/dL   Total Protein 6.0 (L) 6.5 - 8.1 g/dL   Albumin 3.3 (L) 3.5 - 5.0 g/dL   AST 18 15 - 41 U/L   ALT 13 (L) 14 - 54 U/L   Alkaline Phosphatase 46 38 - 126 U/L   Total Bilirubin 0.6 0.3 - 1.2 mg/dL   GFR calc non Af Amer >60 >60 mL/min   GFR calc Af Amer >60 >60 mL/min    Comment: (NOTE) The eGFR has been calculated using the CKD EPI equation. This calculation has not been validated in all clinical situations. eGFR's persistently <60 mL/min signify possible Chronic Kidney Disease.    Anion gap 9 5 - 15     Lipid Panel     Component Value Date/Time   CHOL  12/09/2007 0220    153        ATP III CLASSIFICATION:  <200     mg/dL   Desirable  200-239  mg/dL   Borderline High  >=240    mg/dL   High          TRIG 288 (H) 12/09/2007 0220   HDL 63 12/09/2007 0220   CHOLHDL 2.4 12/09/2007 0220   VLDL 58 (H) 12/09/2007 0220   LDLCALC  12/09/2007 0220    32        Total Cholesterol/HDL:CHD Risk Coronary Heart Disease Risk Table                     Men   Women  1/2 Average Risk   3.4   3.3  Average Risk       5.0   4.4  2 X Average Risk   9.6   7.1  3 X Average Risk  23.4    11.0        Use the calculated Patient Ratio above and the CHD Risk Table to determine the patient's CHD Risk.        ATP III CLASSIFICATION (LDL):  <100     mg/dL   Optimal  100-129  mg/dL   Near or Above                    Optimal  130-159  mg/dL   Borderline  160-189  mg/dL   High  >190     mg/dL   Very High     No results found for: HGBA1C      HPI   Rebekah Henderson is a 61 y.o. 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 Patient notes increased generalized weakness and fatigue going back approximately 2 weeks, but worsening more significantly over the past week. She reports that over the past couple days, she feels exhausted just changing her close. She denies any fevers or chills and denies chest pain, palpitations, significant cough, or dyspnea. She endorses some mild intermittent lower abdominal cramping that she attributes to her IBS, but also endorses nausea with nonbloody vomiting and some chronic loose stools. She denies dysuria or urinary urgency or frequency, and denies flank pain. She denies any melena or hematochezia and denies any bleeding from her gums or petechial rash. She is under the care of oncology) undergoing chemotherapy with cisplatin and etoposide for small cell lung cancer, with last infusion on 12/27/2016. She reports receiving Neulasta on 12/29/2016. She discussed her current complaints with her oncologist and was directed to the emergency department for further evaluation.  ED Course: Upon arrival to the ED, patient is found to be saturating adequately on 2 L/m of supplemental oxygen, and with vitals otherwise stable. EKG features a sinus rhythm with PAC. Chemistry panels notable for potassium of 3.1 and CBC features a worsening anemia with hemoglobin 7.4, down from  9.1 less than a week ago, and down from 11.7 two weeks ago. There is a thrombocytopenia of 19,000, down from 130,000 on 12/31/2016. DRE was performed and stool is FOBT negative. Patient was given Norco and Zofran in the ED and a type and screen was performed. She remained hemodynamically stable and in no acute respiratory distress, but given her more than 4 g drop in hemoglobin over the past 2 weeks, with which she is quite symptomatic, she will be observed on the medical-surgical unit for further evaluation and management of this.  HOSPITAL COURSE:    Symptomatic anemia - This is likely in the setting of ongoing chemotherapy, no evidence of bleed. Status post transfusion of 2 units of packed red blood cells . Platelet count still 21. No signs of active bleeding. Okay to discharge per Dr. Julien Nordmann, discussed via telephone prior to discharge  Thrombocytopenia - Platelets of 19,000 on admission from 113,005 visit ago, repeat this morning is 21,000,   Small cell lung cancer - Currently undergoing radiation therapy, I supposed have the next chemotherapy cycle on 2/13. Defer whether this is to be postponed to Dr. Julien Nordmann with oncology. CT chest with contrast requested by patient, this was supposed to be done in the outpatient setting today Patient to discuss results of CT with Dr. Julien Nordmann  COPD with chronic respiratory failure - Baseline, no wheezing, continue home  medications  Hypokalemia , hypomagnesemia Discontinue triamterene/HCTZ Magnesium oxide increased to 800 mg twice a day  Hypertension  - BP at goal, held triamterene/HCTZ - Continue Norvasc and Toprol  GERD - EGD (June 201) with medium-sized hiatal hernia and non-erosive gastritis  - Managed with BID Protonix at home, will continue   Anxiety  - Stable, continue Prozac   Discharge Exam:   Blood pressure 116/61, pulse 75, temperature 98.1 F (36.7 C), temperature source Oral, resp. rate 16, height 5' 3"  (1.6 m), weight 79  kg (174 lb 2.6 oz), SpO2 98 %. Eyes: lids and conjunctivae pale ENMT: Mucous membranes are moist. No oropharyngeal exudates Respiratory: clear to auscultation bilaterally, no wheezing, no crackles.  Cardiovascular: Regular rate and rhythm, no murmurs / rubs / gallops. No LE edema.  Abdomen: no tenderness. Bowel sounds positive.  Neurologic: Nonfocal     Follow-up Information    Glo Herring., MD. Schedule an appointment as soon as possible for a visit.   Specialty:  Internal Medicine Why:  Hospital follow-up, check CBC, CMP Contact information: [address] [place] [address] [phone]        Eilleen Kempf., MD. Call.   Specialty:  Oncology Why:  To make follow-up appointment Contact information: [place] [address] [phone]           Signed: Reyne Dumas 01/06/2017, 8:58 AM        Time spent >45 mins

## 2017-01-06 NOTE — Progress Notes (Signed)
   01/06/17 1100  Clinical Encounter Type  Visited With Patient  Visit Type Psychological support;Social support  Stress Factors  Patient Stress Factors Health changes  Counseling intern visited with patient to offer emotional support. Counselor has been visiting with patient during treatment at the [facility]. Patient was alert and oriented and reported feeling groggy and extremely fatigued. She expressed concern over not knowing the cause of her current condition and was eager to receive the results of her CT scan this morning. Patient stated that on the days leading up to this admission, she had felt more like her self, before she began cancer treatment, and that coming back to the hospital was a reminder that her life has changed. Counselor affirmed patient's longing for returning to "normal" and not having ongoing treatments and hospital admissions.  Lamount Cohen, Counseling Intern Supervisor - Scientist, research (physical sciences)

## 2017-01-06 NOTE — Care Management Note (Signed)
Case Management Note  Patient Details  Name: Rebekah Henderson MRN: [id number] Date of Birth: 09/12/1956  Subjective/Objective: 61 y/o f admitted w/Anemia. From home.                   Action/Plan:d/c home.   Expected Discharge Date:  01/06/17               Expected Discharge Plan:  Home/Self Care  In-House Referral:     Discharge planning Services  CM Consult  Post Acute Care Choice:    Choice offered to:     DME Arranged:    DME Agency:     HH Arranged:    HH Agency:     Status of Service:  In process, will continue to follow  If discussed at Long Length of Stay Meetings, dates discussed:    Additional Comments:  Dessa Phi, RN 01/06/2017, 11:58 AM

## 2017-01-06 NOTE — Progress Notes (Signed)
Discharge instructions explained to pt. Pt states her headache is almost gone. Discharged via wheelchair to ex husband who will bring her home.

## 2017-01-08 DIAGNOSIS — C3432 Malignant neoplasm of lower lobe, left bronchus or lung: Secondary | ICD-10-CM | POA: Diagnosis not present

## 2017-01-08 DIAGNOSIS — Z510 Encounter for antineoplastic radiation therapy: Secondary | ICD-10-CM | POA: Diagnosis not present

## 2017-01-09 DIAGNOSIS — C3432 Malignant neoplasm of lower lobe, left bronchus or lung: Secondary | ICD-10-CM | POA: Diagnosis not present

## 2017-01-09 DIAGNOSIS — Z510 Encounter for antineoplastic radiation therapy: Secondary | ICD-10-CM | POA: Diagnosis not present

## 2017-01-09 LAB — CULTURE, BLOOD (ROUTINE X 2)
Culture: NO GROWTH
Culture: NO GROWTH

## 2017-01-10 DIAGNOSIS — Z510 Encounter for antineoplastic radiation therapy: Secondary | ICD-10-CM | POA: Diagnosis not present

## 2017-01-10 DIAGNOSIS — C3432 Malignant neoplasm of lower lobe, left bronchus or lung: Secondary | ICD-10-CM | POA: Diagnosis not present

## 2017-01-14 ENCOUNTER — Ambulatory Visit: Payer: Medicare Other

## 2017-01-14 ENCOUNTER — Other Ambulatory Visit (HOSPITAL_BASED_OUTPATIENT_CLINIC_OR_DEPARTMENT_OTHER): Payer: Medicare Other

## 2017-01-14 ENCOUNTER — Ambulatory Visit (HOSPITAL_BASED_OUTPATIENT_CLINIC_OR_DEPARTMENT_OTHER): Payer: Medicare Other

## 2017-01-14 ENCOUNTER — Encounter: Payer: Self-pay | Admitting: Internal Medicine

## 2017-01-14 ENCOUNTER — Telehealth: Payer: Self-pay | Admitting: Internal Medicine

## 2017-01-14 ENCOUNTER — Ambulatory Visit (HOSPITAL_BASED_OUTPATIENT_CLINIC_OR_DEPARTMENT_OTHER): Payer: Medicare Other | Admitting: Internal Medicine

## 2017-01-14 VITALS — BP 121/80 | HR 84 | Temp 98.2°F | Resp 18 | Ht 63.0 in | Wt 174.9 lb

## 2017-01-14 DIAGNOSIS — Z5111 Encounter for antineoplastic chemotherapy: Secondary | ICD-10-CM

## 2017-01-14 DIAGNOSIS — C3492 Malignant neoplasm of unspecified part of left bronchus or lung: Secondary | ICD-10-CM

## 2017-01-14 DIAGNOSIS — C3432 Malignant neoplasm of lower lobe, left bronchus or lung: Secondary | ICD-10-CM

## 2017-01-14 DIAGNOSIS — D649 Anemia, unspecified: Secondary | ICD-10-CM

## 2017-01-14 DIAGNOSIS — D6481 Anemia due to antineoplastic chemotherapy: Secondary | ICD-10-CM | POA: Diagnosis not present

## 2017-01-14 DIAGNOSIS — J449 Chronic obstructive pulmonary disease, unspecified: Secondary | ICD-10-CM

## 2017-01-14 DIAGNOSIS — T451X5A Adverse effect of antineoplastic and immunosuppressive drugs, initial encounter: Secondary | ICD-10-CM

## 2017-01-14 DIAGNOSIS — I10 Essential (primary) hypertension: Secondary | ICD-10-CM

## 2017-01-14 LAB — CBC WITH DIFFERENTIAL/PLATELET
BASO%: 0.3 % (ref 0.0–2.0)
BASOS ABS: 0 10*3/uL (ref 0.0–0.1)
EOS ABS: 0 10*3/uL (ref 0.0–0.5)
EOS%: 0.1 % (ref 0.0–7.0)
HCT: 33.5 % — ABNORMAL LOW (ref 34.8–46.6)
HEMOGLOBIN: 10.8 g/dL — AB (ref 11.6–15.9)
LYMPH%: 22.6 % (ref 14.0–49.7)
MCH: 28.4 pg (ref 25.1–34.0)
MCHC: 32.2 g/dL (ref 31.5–36.0)
MCV: 88.2 fL (ref 79.5–101.0)
MONO#: 1 10*3/uL — ABNORMAL HIGH (ref 0.1–0.9)
MONO%: 13.8 % (ref 0.0–14.0)
NEUT#: 4.7 10*3/uL (ref 1.5–6.5)
NEUT%: 63.2 % (ref 38.4–76.8)
Platelets: 221 10*3/uL (ref 145–400)
RBC: 3.8 10*6/uL (ref 3.70–5.45)
RDW: 19.5 % — ABNORMAL HIGH (ref 11.2–14.5)
WBC: 7.5 10*3/uL (ref 3.9–10.3)
lymph#: 1.7 10*3/uL (ref 0.9–3.3)

## 2017-01-14 LAB — COMPREHENSIVE METABOLIC PANEL
ALBUMIN: 3.6 g/dL (ref 3.5–5.0)
ALK PHOS: 56 U/L (ref 40–150)
ALT: 10 U/L (ref 0–55)
AST: 15 U/L (ref 5–34)
Anion Gap: 11 mEq/L (ref 3–11)
BUN: 6 mg/dL — ABNORMAL LOW (ref 7.0–26.0)
CHLORIDE: 107 meq/L (ref 98–109)
CO2: 24 mEq/L (ref 22–29)
Calcium: 9.3 mg/dL (ref 8.4–10.4)
Creatinine: 0.7 mg/dL (ref 0.6–1.1)
GLUCOSE: 94 mg/dL (ref 70–140)
POTASSIUM: 4.5 meq/L (ref 3.5–5.1)
SODIUM: 142 meq/L (ref 136–145)
Total Bilirubin: 0.61 mg/dL (ref 0.20–1.20)
Total Protein: 6.9 g/dL (ref 6.4–8.3)

## 2017-01-14 LAB — MAGNESIUM: Magnesium: 1.6 mg/dl (ref 1.5–2.5)

## 2017-01-14 MED ORDER — DEXAMETHASONE SODIUM PHOSPHATE 10 MG/ML IJ SOLN
INTRAMUSCULAR | Status: AC
  Filled 2017-01-14: qty 1

## 2017-01-14 MED ORDER — PALONOSETRON HCL INJECTION 0.25 MG/5ML
0.2500 mg | Freq: Once | INTRAVENOUS | Status: AC
  Administered 2017-01-14: 0.25 mg via INTRAVENOUS

## 2017-01-14 MED ORDER — SODIUM CHLORIDE 0.9 % IV SOLN
Freq: Once | INTRAVENOUS | Status: AC
  Administered 2017-01-14: 11:00:00 via INTRAVENOUS

## 2017-01-14 MED ORDER — DEXAMETHASONE SODIUM PHOSPHATE 10 MG/ML IJ SOLN
10.0000 mg | Freq: Once | INTRAMUSCULAR | Status: AC
  Administered 2017-01-14: 10 mg via INTRAVENOUS

## 2017-01-14 MED ORDER — SODIUM CHLORIDE 0.9 % IV SOLN
100.0000 mg/m2 | Freq: Once | INTRAVENOUS | Status: AC
  Administered 2017-01-14: 190 mg via INTRAVENOUS
  Filled 2017-01-14: qty 9.5

## 2017-01-14 MED ORDER — PALONOSETRON HCL INJECTION 0.25 MG/5ML
INTRAVENOUS | Status: AC
  Filled 2017-01-14: qty 5

## 2017-01-14 MED ORDER — SODIUM CHLORIDE 0.9 % IV SOLN
602.0000 mg | Freq: Once | INTRAVENOUS | Status: AC
  Administered 2017-01-14: 600 mg via INTRAVENOUS
  Filled 2017-01-14: qty 60

## 2017-01-14 NOTE — Progress Notes (Signed)
Nutrition Follow-up:  Nutrition follow-up completed during infusion this am.  Patient recently had hospital admission due to anemia.   Reports that she is eating good.  "I eat about 2 meals per day with snacks in between." Patient does not really like ensure/boost drinks.  Reports that lately after radiation she has been going to get a western omelet with hashbrowns for breakfast, usually snacks during the day on peanut butter crackers, peanut butter and jelly sandwiches then has meat and vegetables for dinner.    Reports some nausea but is relieved by medication.   Medications: MVI, Mag ox, protonix, KCL  Labs: reviewed  Anthropometrics:   Weight today 174 pounds 14.4 oz, decreased from 176 pounds on 12/12.     NUTRITION DIAGNOSIS: Inadequate oral intake continues with weight loss and recent hospital admission   INTERVENTION:   Discussed oral nutrition supplement alternatives.   Discussed ways to increase calories and protein. Patient interested in foods to help with anemia.  Handout given to patient and teach back used.      MONITORING, EVALUATION, GOAL: Patient will consume adequate calories and protein to maintain weight and preserve lean body mass.   NEXT VISIT:  As needed  Rebekah Henderson, [place], [place] (pager)

## 2017-01-14 NOTE — Progress Notes (Signed)
[facility] [phone]   [phone]  OFFICE PROGRESS NOTE  Glo Herring., MD [place] [address]  DIAGNOSIS: Limited stage (T1b, N2, M0) small cell lung cancer presented with left lower lobe/infrahilar mass and large mediastinal lymphadenopathy diagnosed in October 2017.  PRIOR THERAPY:Systemic chemotherapy with cisplatin 60 MG/M2 on day 1 and etoposide 120 MG/M2 on days 1, 2 and 3 status post 1 cycle. This was discontinued secondary to intolerance.  CURRENT THERAPY: Systemic chemotherapy with carboplatin for AUC of 4 on day 1 and etoposide 100 MG/M2 on days 1, 2 and 3 with Neulasta support on day 4 every 3 weeks. Status post 3 cycles.  INTERVAL HISTORY: Rebekah Henderson 61 y.o. female came to the clinic today for follow-up visit accompanied by a friend. The patient tolerated the last cycle of her systemic chemotherapy much better. She denied having any significant chest pain, shortness of breath, cough or hemoptysis. She denied having any nausea or vomiting. She has no fever or chills. She has intermittent headache. The patient denied having any weight loss or night sweats. The rash in her lower extremities has significantly improved. She had repeat CT scan of the chest performed recently and she is here for evaluation and discussion of her scan results.  MEDICAL HISTORY: Past Medical History:  Diagnosis Date  . 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 Anxiety    takes Prozac daily  . Arthritis   . Arthritis   . Cancer ([place])    skin, lung  . Colon polyps   . COPD (chronic obstructive pulmonary disease) ([place])   . Diverticulitis   . Dyspnea    with exertion  . Encounter for antineoplastic chemotherapy 12/03/2016  . Fibromyalgia   . GERD (gastroesophageal reflux disease)    takes Pantoprazole daily  . Hypertension    takes Metoprolol,Triamterene-HCTZ,and Amlodipine daily  . Hypothyroidism    takes  Synthroid daily  . IBS (irritable bowel syndrome)   . PONV (postoperative nausea and vomiting)    pt also states that she had some difficulty breathing after cervical fusion    ALLERGIES:  is allergic to penicillins; codeine; ranitidine hcl; keflex [cephalexin]; and lyrica [pregabalin].  MEDICATIONS:  Current Outpatient Prescriptions  Medication Sig Dispense Refill  . albuterol (PROVENTIL) 2 MG tablet Take 2 mg by mouth daily as needed for wheezing or shortness of breath.     Marland Kitchen amLODipine (NORVASC) 5 MG tablet Take 5 mg by mouth daily.      . Dicyclomine HCl (BENTYL PO) Take 1 tablet by mouth 3 (three) times daily as needed (IBS).    Marland Kitchen estazolam (PROSOM) 2 MG tablet Take 2 mg by mouth at bedtime.    Marland Kitchen estradiol (ESTRACE) 2 MG tablet Take 2 mg by mouth daily.      Marland Kitchen FLUoxetine (PROZAC) 40 MG capsule Take 40 mg by mouth daily.   2  . HYDROcodone-acetaminophen (NORCO/VICODIN) 5-325 MG tablet Take 1 tablet by mouth every 4 (four) hours as needed for moderate pain.   0  . levothyroxine (SYNTHROID, LEVOTHROID) 25 MCG tablet Take 25 mcg by mouth daily before breakfast.     . magnesium oxide (MAG-OX) 400 (241.3 Mg) MG tablet Take 2 tablets (800 mg total) by mouth 2 (two) times daily. 120 tablet 3  . metoprolol succinate (TOPROL-XL) 25 MG 24 hr tablet Take 25 mg by mouth daily.    . Multiple Vitamin (MULTIVITAMIN WITH MINERALS) TABS tablet Take 2  tablets by mouth daily.    . ondansetron (ZOFRAN) 8 MG tablet Take 1 tablet (8 mg total) by mouth 3 (three) times daily as needed for nausea or vomiting. 30 tablet 0  . pantoprazole (PROTONIX) 40 MG tablet Take 40 mg by mouth 2 (two) times daily before a meal.   10  . potassium chloride SA (K-DUR,KLOR-CON) 20 MEQ tablet Take 2 tablets (40 mEq total) by mouth daily. 30 tablet 2  . PROAIR HFA 108 (90 BASE) MCG/ACT inhaler Inhale 1-2 puffs into the lungs every 4 (four) hours as needed.   0  . loperamide (IMODIUM) 2 MG capsule Take 2 mg by mouth every 2 (two)  hours as needed for diarrhea or loose stools.     No current facility-administered medications for this visit.     SURGICAL HISTORY:  Past Surgical History:  Procedure Laterality Date  . BIOPSY N/A 05/25/2013   Procedure: BIOPSIES (Random Colon; Duodenal; Gastric);  Surgeon: Danie Binder, MD;  Location: [facility];  Service: Endoscopy;  Laterality: N/A;  . BLADDER SUSPENSION    . BREAST ENHANCEMENT SURGERY    . BREAST IMPLANT REMOVAL    . CERVICAL FUSION  AUG 2013  . CHOLECYSTECTOMY  1999  . COLONOSCOPY  2007 [facility]   POLYPS  . COLONOSCOPY WITH PROPOFOL N/A 05/25/2013   Procedure: COLONOSCOPY WITH PROPOFOL(at cecum 0957) total withdrawal time=76mn);  Surgeon: SDanie Binder MD;  Location: [facility];  Service: Endoscopy;  Laterality: N/A;  . ESOPHAGOGASTRODUODENOSCOPY (EGD) WITH PROPOFOL N/A 05/25/2013   Procedure: ESOPHAGOGASTRODUODENOSCOPY (EGD) WITH PROPOFOL;  Surgeon: SDanie Binder MD;  Location: [facility];  Service: Endoscopy;  Laterality: N/A;  . FOOT SURGERY    . POLYPECTOMY N/A 05/25/2013   Procedure: POLYPECTOMY (Rectal and Gastric);  Surgeon: SDanie Binder MD;  Location: [facility];  Service: Endoscopy;  Laterality: N/A;  . TONSILLECTOMY    . UPPER GASTROINTESTINAL ENDOSCOPY    . VIDEO BRONCHOSCOPY WITH ENDOBRONCHIAL ULTRASOUND  09/12/2016   Procedure: VIDEO BRONCHOSCOPY WITH ENDOBRONCHIAL ULTRASOUND AND BIOPSY;  Surgeon: DJuanito Doom MD;  Location: [facility];  Service: Cardiopulmonary;;    REVIEW OF SYSTEMS:  Constitutional: positive for fatigue Eyes: negative Ears, nose, mouth, throat, and face: negative Respiratory: negative Cardiovascular: negative Gastrointestinal: negative Genitourinary:negative Integument/breast: negative Hematologic/lymphatic: negative Musculoskeletal:negative Neurological: negative Behavioral/Psych: negative Endocrine: negative Allergic/Immunologic: negative   PHYSICAL EXAMINATION: General appearance: alert, cooperative, fatigued and no  distress Head: Normocephalic, without obvious abnormality, atraumatic Neck: no adenopathy, no JVD, supple, symmetrical, trachea midline and thyroid not enlarged, symmetric, no tenderness/mass/nodules Lymph nodes: Cervical, supraclavicular, and axillary nodes normal. Resp: clear to auscultation bilaterally Back: symmetric, no curvature. ROM normal. No CVA tenderness. Cardio: regular rate and rhythm, S1, S2 normal, no murmur, click, rub or gallop GI: soft, non-tender; bowel sounds normal; no masses,  no organomegaly Extremities: Few areas of erythema in the feet better than before Neurologic: Alert and oriented X 3, normal strength and tone. Normal symmetric reflexes. Normal coordination and gait   ECOG PERFORMANCE STATUS: 1 - Symptomatic but completely ambulatory  Blood pressure 121/80, pulse 84, temperature 98.2 F (36.8 C), temperature source Oral, resp. rate 18, height '5\' 3"'$  (1.6 m), weight 174 lb 14.4 oz (79.3 kg), SpO2 94 %.  LABORATORY DATA: Lab Results  Component Value Date   WBC 7.5 01/14/2017   HGB 10.8 (L) 01/14/2017   HCT 33.5 (L) 01/14/2017   MCV 88.2 01/14/2017   PLT 221 01/14/2017      Chemistry  Component Value Date/Time   NA 142 01/14/2017 0858   K 4.5 01/14/2017 0858   CL 105 01/06/2017 0426   CO2 24 01/14/2017 0858   BUN 6.0 (L) 01/14/2017 0858   CREATININE 0.7 01/14/2017 0858      Component Value Date/Time   CALCIUM 9.3 01/14/2017 0858   ALKPHOS 56 01/14/2017 0858   AST 15 01/14/2017 0858   ALT 10 01/14/2017 0858   BILITOT 0.61 01/14/2017 0858       RADIOGRAPHIC STUDIES: Ct Chest W Contrast  Result Date: 01/06/2017 CLINICAL DATA:  Subsequent treatment strategy lung carcinoma EXAM: CT CHEST WITH CONTRAST TECHNIQUE: Multidetector CT imaging of the chest was performed during intravenous contrast administration. CONTRAST:  36m ISOVUE-300 IOPAMIDOL (ISOVUE-300) INJECTION 61% COMPARISON:  PET-CT 09/27/2016 FINDINGS: Cardiovascular: No significant  vascular findings. Normal heart size. No pericardial effusion. Mediastinum/Nodes: No axillary or supraclavicular lymphadenopathy. Large AP window nodal mass is decreased in size measuring 3.2 x 3.2 cm compared to 7.0 x 5.3 cm on PET-CT scan 09/27/2016. No new mediastinal adenopathy. Lungs/Pleura: Reduction in size of LEFT lower lobe perihilar mass. No measurable mass remains. Scattered subpleural nodules are not changed. Upper Abdomen: Limited view of the liver, kidneys, pancreas are unremarkable. Normal adrenal glands. Musculoskeletal: No aggressive osseous lesion. IMPRESSION: 1. Interval decrease in size of LEFT lower lobe perihilar mass which is no longer measurable. 2. Interval decrease in size of AP window nodal mass. 3. No evidence disease progression. Electronically Signed   By: SSuzy BouchardM.D.   On: 01/06/2017 10:50    ASSESSMENT AND PLAN:  This is a very pleasant 61years old white female with limited stage small cell lung cancer status post 1 cycle of systemic chemotherapy with cisplatin and etoposide discontinued secondary to intolerance. She is currently undergoing systemic chemotherapy with carboplatin and etoposide status post 3 cycles. She is tolerating this treatment much better. She had repeat CT scan of the chest performed recently. I personally and independently reviewed the scan images and discuss the results with the patient and her friend. Her scan showed significant improvement in her disease. I recommended for the patient to proceed with 2 more cycles of the same regimen. She will start cycle #4 today. I will see her back for follow-up visit in 3 weeks for evaluation before starting cycle #5. For the chemotherapy-induced anemia, the patient has a stable hemoglobin and hematocrit. We will continue to monitor for now and consider her for transfusion as needed. For hypertension, she will continue her current treatment with Toprol-XL, Dyazide and Norvasc. For the  hypomagnesemia, this is significantly improved and we'll continue to monitor for now. For the COPD the patient will continue on albuterol and pro-air. She was advised to call immediately if she has any concerning symptoms in the interval. The patient voices understanding of current disease status and treatment options and is in agreement with the current care plan.  All questions were answered. The patient knows to call the clinic with any problems, questions or concerns. We can certainly see the patient much sooner if necessary.  Disclaimer: This note was dictated with voice recognition software. Similar sounding words can inadvertently be transcribed and may not be corrected upon review.

## 2017-01-14 NOTE — Telephone Encounter (Signed)
Patient bypassed scheduling area. No additional appointments needed per 01/14/17.

## 2017-01-15 ENCOUNTER — Other Ambulatory Visit: Payer: Self-pay | Admitting: Internal Medicine

## 2017-01-15 ENCOUNTER — Ambulatory Visit (HOSPITAL_BASED_OUTPATIENT_CLINIC_OR_DEPARTMENT_OTHER): Payer: Medicare Other

## 2017-01-15 VITALS — BP 120/54 | HR 73 | Temp 98.0°F | Resp 18

## 2017-01-15 DIAGNOSIS — Z5111 Encounter for antineoplastic chemotherapy: Secondary | ICD-10-CM

## 2017-01-15 DIAGNOSIS — C3492 Malignant neoplasm of unspecified part of left bronchus or lung: Secondary | ICD-10-CM

## 2017-01-15 DIAGNOSIS — C3432 Malignant neoplasm of lower lobe, left bronchus or lung: Secondary | ICD-10-CM

## 2017-01-15 MED ORDER — DEXAMETHASONE SODIUM PHOSPHATE 10 MG/ML IJ SOLN
10.0000 mg | Freq: Once | INTRAMUSCULAR | Status: AC
  Administered 2017-01-15: 10 mg via INTRAVENOUS

## 2017-01-15 MED ORDER — SODIUM CHLORIDE 0.9 % IV SOLN
100.0000 mg/m2 | Freq: Once | INTRAVENOUS | Status: AC
  Administered 2017-01-15: 190 mg via INTRAVENOUS
  Filled 2017-01-15: qty 9.5

## 2017-01-15 MED ORDER — SODIUM CHLORIDE 0.9 % IV SOLN
Freq: Once | INTRAVENOUS | Status: AC
  Administered 2017-01-15: 11:00:00 via INTRAVENOUS

## 2017-01-15 MED ORDER — DEXAMETHASONE SODIUM PHOSPHATE 10 MG/ML IJ SOLN
INTRAMUSCULAR | Status: AC
  Filled 2017-01-15: qty 1

## 2017-01-15 NOTE — Progress Notes (Signed)
Per Rebekah Henderson pt may switch to onpro for all cycles due to transportation issues.Pharmacy notified.

## 2017-01-16 ENCOUNTER — Ambulatory Visit (HOSPITAL_BASED_OUTPATIENT_CLINIC_OR_DEPARTMENT_OTHER): Payer: Medicare Other

## 2017-01-16 VITALS — BP 124/77 | HR 66 | Temp 98.2°F | Resp 18

## 2017-01-16 DIAGNOSIS — C3432 Malignant neoplasm of lower lobe, left bronchus or lung: Secondary | ICD-10-CM | POA: Diagnosis not present

## 2017-01-16 DIAGNOSIS — Z5111 Encounter for antineoplastic chemotherapy: Secondary | ICD-10-CM | POA: Diagnosis not present

## 2017-01-16 DIAGNOSIS — D6481 Anemia due to antineoplastic chemotherapy: Secondary | ICD-10-CM

## 2017-01-16 DIAGNOSIS — C3492 Malignant neoplasm of unspecified part of left bronchus or lung: Secondary | ICD-10-CM

## 2017-01-16 MED ORDER — SODIUM CHLORIDE 0.9 % IV SOLN
100.0000 mg/m2 | Freq: Once | INTRAVENOUS | Status: AC
  Administered 2017-01-16: 190 mg via INTRAVENOUS
  Filled 2017-01-16: qty 9.5

## 2017-01-16 MED ORDER — SODIUM CHLORIDE 0.9 % IV SOLN
Freq: Once | INTRAVENOUS | Status: AC
  Administered 2017-01-16: 11:00:00 via INTRAVENOUS

## 2017-01-16 MED ORDER — PEGFILGRASTIM 6 MG/0.6ML ~~LOC~~ PSKT
6.0000 mg | PREFILLED_SYRINGE | Freq: Once | SUBCUTANEOUS | Status: AC
  Administered 2017-01-16: 6 mg via SUBCUTANEOUS
  Filled 2017-01-16: qty 0.6

## 2017-01-16 MED ORDER — DEXAMETHASONE SODIUM PHOSPHATE 10 MG/ML IJ SOLN
10.0000 mg | Freq: Once | INTRAMUSCULAR | Status: AC
  Administered 2017-01-16: 10 mg via INTRAVENOUS

## 2017-01-16 MED ORDER — DEXAMETHASONE SODIUM PHOSPHATE 10 MG/ML IJ SOLN
INTRAMUSCULAR | Status: AC
  Filled 2017-01-16: qty 1

## 2017-01-16 NOTE — Patient Instructions (Signed)
[facility] Discharge Instructions for Patients Receiving Chemotherapy  Today you received the following chemotherapy agents: Etoposide   To help prevent nausea and vomiting after your treatment, we encourage you to take your nausea medication as directed.    If you develop nausea and vomiting that is not controlled by your nausea medication, call the clinic.   BELOW ARE SYMPTOMS THAT SHOULD BE REPORTED IMMEDIATELY:  *FEVER GREATER THAN 100.5 F  *CHILLS WITH OR WITHOUT FEVER  NAUSEA AND VOMITING THAT IS NOT CONTROLLED WITH YOUR NAUSEA MEDICATION  *UNUSUAL SHORTNESS OF BREATH  *UNUSUAL BRUISING OR BLEEDING  TENDERNESS IN MOUTH AND THROAT WITH OR WITHOUT PRESENCE OF ULCERS  *URINARY PROBLEMS  *BOWEL PROBLEMS  UNUSUAL RASH Items with * indicate a potential emergency and should be followed up as soon as possible.  Feel free to call the clinic you have any questions or concerns. The clinic phone number is [phone].  Please show the CHEMO ALERT CARD at check-in to the Emergency Department and triage nurse.   

## 2017-01-17 DIAGNOSIS — D61818 Other pancytopenia: Secondary | ICD-10-CM | POA: Diagnosis not present

## 2017-01-17 DIAGNOSIS — Z6830 Body mass index (BMI) 30.0-30.9, adult: Secondary | ICD-10-CM | POA: Diagnosis not present

## 2017-01-17 DIAGNOSIS — C3492 Malignant neoplasm of unspecified part of left bronchus or lung: Secondary | ICD-10-CM | POA: Diagnosis not present

## 2017-01-17 DIAGNOSIS — D649 Anemia, unspecified: Secondary | ICD-10-CM | POA: Diagnosis not present

## 2017-01-18 ENCOUNTER — Ambulatory Visit: Payer: Medicare Other

## 2017-01-20 ENCOUNTER — Other Ambulatory Visit: Payer: Self-pay | Admitting: Internal Medicine

## 2017-01-20 ENCOUNTER — Other Ambulatory Visit: Payer: Medicare Other

## 2017-01-20 ENCOUNTER — Other Ambulatory Visit (HOSPITAL_COMMUNITY)
Admission: RE | Admit: 2017-01-20 | Discharge: 2017-01-20 | Disposition: A | Discharge status: Home / Self Care | Payer: Medicare Other | Source: Ambulatory Visit | Attending: Internal Medicine | Admitting: Internal Medicine

## 2017-01-20 DIAGNOSIS — C3492 Malignant neoplasm of unspecified part of left bronchus or lung: Secondary | ICD-10-CM | POA: Insufficient documentation

## 2017-01-20 DIAGNOSIS — R0602 Shortness of breath: Secondary | ICD-10-CM

## 2017-01-20 DIAGNOSIS — R112 Nausea with vomiting, unspecified: Secondary | ICD-10-CM

## 2017-01-20 LAB — COMPREHENSIVE METABOLIC PANEL
ALBUMIN: 3.8 g/dL (ref 3.5–5.0)
ALT: 13 U/L — AB (ref 14–54)
AST: 21 U/L (ref 15–41)
Alkaline Phosphatase: 64 U/L (ref 38–126)
Anion gap: 10 (ref 5–15)
BUN: 12 mg/dL (ref 6–20)
CHLORIDE: 99 mmol/L — AB (ref 101–111)
CO2: 26 mmol/L (ref 22–32)
Calcium: 8.9 mg/dL (ref 8.9–10.3)
Creatinine, Ser: 0.65 mg/dL (ref 0.44–1.00)
GFR calc Af Amer: >60 mL/min (ref >60)
GLUCOSE: 113 mg/dL — AB (ref 65–99)
POTASSIUM: 3.5 mmol/L (ref 3.5–5.1)
Sodium: 135 mmol/L (ref 135–145)
Total Bilirubin: 1.1 mg/dL (ref 0.3–1.2)
Total Protein: 7 g/dL (ref 6.5–8.1)

## 2017-01-20 LAB — CBC WITH DIFFERENTIAL/PLATELET
BASOS ABS: 0 10*3/uL (ref 0.0–0.1)
Basophils Relative: 0 %
EOS ABS: 0 10*3/uL (ref 0.0–0.7)
Eosinophils Relative: 0 %
HCT: 30.5 % — ABNORMAL LOW (ref 36.0–46.0)
Hemoglobin: 10 g/dL — ABNORMAL LOW (ref 12.0–15.0)
LYMPHS ABS: 1 10*3/uL (ref 0.7–4.0)
LYMPHS PCT: 8 %
MCH: 28.7 pg (ref 26.0–34.0)
MCHC: 32.8 g/dL (ref 30.0–36.0)
MCV: 87.4 fL (ref 78.0–100.0)
MONO ABS: 0.3 10*3/uL (ref 0.1–1.0)
Monocytes Relative: 2 %
NEUTROS ABS: 11.6 10*3/uL — AB (ref 1.7–7.7)
Neutrophils Relative %: 90 %
PLATELETS: 237 10*3/uL (ref 150–400)
RBC: 3.49 MIL/uL — ABNORMAL LOW (ref 3.87–5.11)
RDW: 17.5 % — AB (ref 11.5–15.5)
WBC Morphology: INCREASED
WBC: 12.9 10*3/uL — ABNORMAL HIGH (ref 4.0–10.5)

## 2017-01-21 DIAGNOSIS — Z1389 Encounter for screening for other disorder: Secondary | ICD-10-CM | POA: Diagnosis not present

## 2017-01-21 DIAGNOSIS — E6609 Other obesity due to excess calories: Secondary | ICD-10-CM | POA: Diagnosis not present

## 2017-01-21 DIAGNOSIS — N3090 Cystitis, unspecified without hematuria: Secondary | ICD-10-CM | POA: Diagnosis not present

## 2017-01-21 DIAGNOSIS — Z6830 Body mass index (BMI) 30.0-30.9, adult: Secondary | ICD-10-CM | POA: Diagnosis not present

## 2017-01-22 ENCOUNTER — Telehealth: Payer: Self-pay | Admitting: *Deleted

## 2017-01-22 NOTE — Telephone Encounter (Signed)
Per Dr Julien Nordmann I told pt to take cipro.

## 2017-01-22 NOTE — Telephone Encounter (Signed)
Pt called again stating she is waiting for answer from Dr Julien Nordmann if she can take the Cipro. She is still having the pain on urination,

## 2017-01-22 NOTE — Telephone Encounter (Signed)
"  I saw my PCP yesterday.  It burns when I urinate and I urinate frequently.  I thought I had a UTI but my PCP tested my urine, said it's fine and to ask Dr. Julien Nordmann if this is related to the chemotherapy?  I was given a prescription for Cipro but instructed not to use until I talked with Dr. Julien Nordmann.  Return number [phone]."

## 2017-01-27 ENCOUNTER — Other Ambulatory Visit (HOSPITAL_COMMUNITY)
Admission: RE | Admit: 2017-01-27 | Discharge: 2017-01-27 | Disposition: A | Discharge status: Home / Self Care | Payer: Medicare Other | Source: Ambulatory Visit | Attending: Internal Medicine | Admitting: Internal Medicine

## 2017-01-27 ENCOUNTER — Other Ambulatory Visit: Payer: Medicare Other

## 2017-01-27 ENCOUNTER — Telehealth: Payer: Self-pay | Admitting: Medical Oncology

## 2017-01-27 ENCOUNTER — Other Ambulatory Visit: Payer: Self-pay | Admitting: Medical Oncology

## 2017-01-27 ENCOUNTER — Telehealth: Payer: Self-pay | Admitting: *Deleted

## 2017-01-27 DIAGNOSIS — C3492 Malignant neoplasm of unspecified part of left bronchus or lung: Secondary | ICD-10-CM | POA: Insufficient documentation

## 2017-01-27 LAB — COMPREHENSIVE METABOLIC PANEL
ALK PHOS: 47 U/L (ref 38–126)
ALT: 12 U/L — AB (ref 14–54)
AST: 18 U/L (ref 15–41)
Albumin: 3.7 g/dL (ref 3.5–5.0)
Anion gap: 9 (ref 5–15)
BILIRUBIN TOTAL: 0.8 mg/dL (ref 0.3–1.2)
BUN: 6 mg/dL (ref 6–20)
CALCIUM: 9.1 mg/dL (ref 8.9–10.3)
CO2: 26 mmol/L (ref 22–32)
CREATININE: 0.61 mg/dL (ref 0.44–1.00)
Chloride: 104 mmol/L (ref 101–111)
Glucose, Bld: 99 mg/dL (ref 65–99)
Potassium: 3.9 mmol/L (ref 3.5–5.1)
Sodium: 139 mmol/L (ref 135–145)
TOTAL PROTEIN: 6.6 g/dL (ref 6.5–8.1)

## 2017-01-27 LAB — CBC WITH DIFFERENTIAL/PLATELET
Basophils Absolute: 0 10*3/uL (ref 0.0–0.1)
Basophils Relative: 0 %
EOS PCT: 0 %
Eosinophils Absolute: 0 10*3/uL (ref 0.0–0.7)
HCT: 22.3 % — ABNORMAL LOW (ref 36.0–46.0)
HEMOGLOBIN: 7.5 g/dL — AB (ref 12.0–15.0)
LYMPHS PCT: 27 %
Lymphs Abs: 1.3 10*3/uL (ref 0.7–4.0)
MCH: 29.1 pg (ref 26.0–34.0)
MCHC: 33.6 g/dL (ref 30.0–36.0)
MCV: 86.4 fL (ref 78.0–100.0)
Monocytes Absolute: 0.7 10*3/uL (ref 0.1–1.0)
Monocytes Relative: 15 %
NEUTROS ABS: 2.9 10*3/uL (ref 1.7–7.7)
NEUTROS PCT: 58 %
Platelets: 13 10*3/uL — CL (ref 150–400)
RBC: 2.58 MIL/uL — ABNORMAL LOW (ref 3.87–5.11)
RDW: 16.8 % — ABNORMAL HIGH (ref 11.5–15.5)
WBC MORPHOLOGY: INCREASED
WBC: 4.9 10*3/uL (ref 4.0–10.5)

## 2017-01-27 NOTE — Telephone Encounter (Signed)
Faxed weekly lab order to [facility] until further notice.

## 2017-01-27 NOTE — Telephone Encounter (Signed)
Call received from [facility] reporting today's platelet count = 13.   Dr. Julien Nordmann notified.  Verbal order received and read back for patient to be instructed of bleeding precautions and may need platelet transfusion tomorrow.   Called patient who denies any bleeding problems but has felt very tired.  Asked if "taking Cipro caused the platelets to drop, I have one more pill to take tomorrow."  The chemotherapy treatment had a greater impact than the Cipro.  Says she will be careful and call I f any bleeding occurs.  Will follow bleeding precautions and rest.

## 2017-01-29 ENCOUNTER — Ambulatory Visit (HOSPITAL_BASED_OUTPATIENT_CLINIC_OR_DEPARTMENT_OTHER): Payer: Medicare Other

## 2017-01-29 ENCOUNTER — Ambulatory Visit (HOSPITAL_COMMUNITY)
Admission: RE | Admit: 2017-01-29 | Discharge: 2017-01-29 | Disposition: A | Discharge status: Home / Self Care | Payer: Medicare Other | Source: Ambulatory Visit | Attending: Internal Medicine | Admitting: Internal Medicine

## 2017-01-29 ENCOUNTER — Other Ambulatory Visit: Payer: Self-pay | Admitting: Medical Oncology

## 2017-01-29 DIAGNOSIS — D696 Thrombocytopenia, unspecified: Secondary | ICD-10-CM

## 2017-01-29 DIAGNOSIS — D649 Anemia, unspecified: Secondary | ICD-10-CM | POA: Diagnosis not present

## 2017-01-29 DIAGNOSIS — R110 Nausea: Secondary | ICD-10-CM

## 2017-01-29 DIAGNOSIS — C3432 Malignant neoplasm of lower lobe, left bronchus or lung: Secondary | ICD-10-CM

## 2017-01-29 DIAGNOSIS — R0902 Hypoxemia: Secondary | ICD-10-CM | POA: Diagnosis not present

## 2017-01-29 DIAGNOSIS — J449 Chronic obstructive pulmonary disease, unspecified: Secondary | ICD-10-CM | POA: Diagnosis not present

## 2017-01-29 LAB — CBC WITH DIFFERENTIAL/PLATELET
BASO%: 0.2 % (ref 0.0–2.0)
BASOS ABS: 0 10*3/uL (ref 0.0–0.1)
EOS ABS: 0 10*3/uL (ref 0.0–0.5)
EOS%: 0.3 % (ref 0.0–7.0)
HEMATOCRIT: 22.7 % — AB (ref 34.8–46.6)
HEMOGLOBIN: 7.7 g/dL — AB (ref 11.6–15.9)
LYMPH#: 1.5 10*3/uL (ref 0.9–3.3)
LYMPH%: 14.4 % (ref 14.0–49.7)
MCH: 28.3 pg (ref 25.1–34.0)
MCHC: 33.9 g/dL (ref 31.5–36.0)
MCV: 83.5 fL (ref 79.5–101.0)
MONO#: 1 10*3/uL — AB (ref 0.1–0.9)
MONO%: 9.5 % (ref 0.0–14.0)
NEUT#: 7.9 10*3/uL — ABNORMAL HIGH (ref 1.5–6.5)
NEUT%: 75.6 % (ref 38.4–76.8)
PLATELETS: 50 10*3/uL — AB (ref 145–400)
RBC: 2.72 10*6/uL — ABNORMAL LOW (ref 3.70–5.45)
RDW: 17.3 % — ABNORMAL HIGH (ref 11.2–14.5)
WBC: 10.5 10*3/uL — ABNORMAL HIGH (ref 3.9–10.3)
nRBC: 1 % — ABNORMAL HIGH (ref 0–0)

## 2017-01-29 LAB — PREPARE RBC (CROSSMATCH)

## 2017-01-29 MED ORDER — DIPHENHYDRAMINE HCL 25 MG PO CAPS
25.0000 mg | ORAL_CAPSULE | Freq: Once | ORAL | Status: AC
  Administered 2017-01-29: 25 mg via ORAL
  Filled 2017-01-29: qty 1

## 2017-01-29 MED ORDER — SODIUM CHLORIDE 0.9 % IV SOLN
Freq: Once | INTRAVENOUS | Status: DC
  Filled 2017-01-29: qty 4

## 2017-01-29 MED ORDER — SODIUM CHLORIDE 0.9 % IV SOLN
250.0000 mL | Freq: Once | INTRAVENOUS | Status: AC
  Administered 2017-01-29: 250 mL via INTRAVENOUS

## 2017-01-29 MED ORDER — ACETAMINOPHEN 325 MG PO TABS
650.0000 mg | ORAL_TABLET | Freq: Once | ORAL | Status: AC
  Administered 2017-01-29: 650 mg via ORAL
  Filled 2017-01-29: qty 2

## 2017-01-29 NOTE — Progress Notes (Signed)
Diagnosis Association: Thrombocytopenia ([place]) (D69.6); Symptomatic anemia (D64.9)  Provider: Curt Bears  Procedure: Pt received 2 units of PRBCs  Pt tolerated procedure well.  Post procedure: Pt alert,oriented and ambulatory to wheelchair. D/C instructions given with verbal understanding

## 2017-01-29 NOTE — Discharge Instructions (Signed)

## 2017-01-30 ENCOUNTER — Telehealth: Payer: Self-pay | Admitting: Medical Oncology

## 2017-01-30 LAB — BPAM RBC
BLOOD PRODUCT EXPIRATION DATE: 201803272359
BLOOD PRODUCT EXPIRATION DATE: 201803272359
ISSUE DATE / TIME: 201802281209
ISSUE DATE / TIME: 201802281209
UNIT TYPE AND RH: 5100
Unit Type and Rh: 5100

## 2017-01-30 LAB — TYPE AND SCREEN
ABO/RH(D): O POS
ANTIBODY SCREEN: NEGATIVE
Unit division: 0
Unit division: 0

## 2017-01-30 NOTE — Telephone Encounter (Signed)
Pt feeling better after blood transfusion. I reviewed her labs with her from yesterday. appt Monday with [facility].

## 2017-02-03 ENCOUNTER — Encounter: Payer: Self-pay | Admitting: Internal Medicine

## 2017-02-03 ENCOUNTER — Ambulatory Visit (HOSPITAL_BASED_OUTPATIENT_CLINIC_OR_DEPARTMENT_OTHER): Payer: Medicare Other

## 2017-02-03 ENCOUNTER — Ambulatory Visit (HOSPITAL_BASED_OUTPATIENT_CLINIC_OR_DEPARTMENT_OTHER): Payer: Medicare Other | Admitting: Internal Medicine

## 2017-02-03 ENCOUNTER — Other Ambulatory Visit (HOSPITAL_BASED_OUTPATIENT_CLINIC_OR_DEPARTMENT_OTHER): Payer: Medicare Other

## 2017-02-03 VITALS — BP 134/94 | HR 80 | Temp 97.7°F | Resp 18 | Ht 63.0 in | Wt 172.4 lb

## 2017-02-03 DIAGNOSIS — Z5111 Encounter for antineoplastic chemotherapy: Secondary | ICD-10-CM

## 2017-02-03 DIAGNOSIS — C3492 Malignant neoplasm of unspecified part of left bronchus or lung: Secondary | ICD-10-CM

## 2017-02-03 DIAGNOSIS — D61810 Antineoplastic chemotherapy induced pancytopenia: Secondary | ICD-10-CM | POA: Diagnosis not present

## 2017-02-03 DIAGNOSIS — D696 Thrombocytopenia, unspecified: Secondary | ICD-10-CM | POA: Diagnosis not present

## 2017-02-03 DIAGNOSIS — C3432 Malignant neoplasm of lower lobe, left bronchus or lung: Secondary | ICD-10-CM | POA: Diagnosis not present

## 2017-02-03 DIAGNOSIS — T451X5A Adverse effect of antineoplastic and immunosuppressive drugs, initial encounter: Secondary | ICD-10-CM

## 2017-02-03 DIAGNOSIS — M797 Fibromyalgia: Secondary | ICD-10-CM | POA: Diagnosis not present

## 2017-02-03 DIAGNOSIS — D6481 Anemia due to antineoplastic chemotherapy: Secondary | ICD-10-CM

## 2017-02-03 LAB — CBC WITH DIFFERENTIAL/PLATELET
BASO%: 0.9 % (ref 0.0–2.0)
Basophils Absolute: 0.1 10*3/uL (ref 0.0–0.1)
EOS ABS: 0 10*3/uL (ref 0.0–0.5)
EOS%: 0.6 % (ref 0.0–7.0)
HCT: 35.3 % (ref 34.8–46.6)
HEMOGLOBIN: 12.1 g/dL (ref 11.6–15.9)
LYMPH%: 17.4 % (ref 14.0–49.7)
MCH: 29.6 pg (ref 25.1–34.0)
MCHC: 34.3 g/dL (ref 31.5–36.0)
MCV: 86.2 fL (ref 79.5–101.0)
MONO#: 0.9 10*3/uL (ref 0.1–0.9)
MONO%: 11.5 % (ref 0.0–14.0)
NEUT%: 69.6 % (ref 38.4–76.8)
NEUTROS ABS: 5.5 10*3/uL (ref 1.5–6.5)
Platelets: 134 10*3/uL — ABNORMAL LOW (ref 145–400)
RBC: 4.1 10*6/uL (ref 3.70–5.45)
RDW: 17.5 % — AB (ref 11.2–14.5)
WBC: 7.8 10*3/uL (ref 3.9–10.3)
lymph#: 1.4 10*3/uL (ref 0.9–3.3)

## 2017-02-03 LAB — COMPREHENSIVE METABOLIC PANEL
ALBUMIN: 3.9 g/dL (ref 3.5–5.0)
ALK PHOS: 54 U/L (ref 40–150)
ALT: 9 U/L (ref 0–55)
AST: 18 U/L (ref 5–34)
Anion Gap: 10 mEq/L (ref 3–11)
BILIRUBIN TOTAL: 0.73 mg/dL (ref 0.20–1.20)
BUN: 11.2 mg/dL (ref 7.0–26.0)
CO2: 25 meq/L (ref 22–29)
Calcium: 9.7 mg/dL (ref 8.4–10.4)
Chloride: 104 mEq/L (ref 98–109)
Creatinine: 0.7 mg/dL (ref 0.6–1.1)
EGFR: 86 mL/min/{1.73_m2} — ABNORMAL LOW (ref >90)
GLUCOSE: 95 mg/dL (ref 70–140)
Potassium: 4.2 mEq/L (ref 3.5–5.1)
SODIUM: 139 meq/L (ref 136–145)
TOTAL PROTEIN: 7.3 g/dL (ref 6.4–8.3)

## 2017-02-03 LAB — MAGNESIUM: Magnesium: 1.5 mg/dl (ref 1.5–2.5)

## 2017-02-03 MED ORDER — ETOPOSIDE CHEMO INJECTION 1 GM/50ML
100.0000 mg/m2 | Freq: Once | INTRAVENOUS | Status: AC
  Administered 2017-02-03: 190 mg via INTRAVENOUS
  Filled 2017-02-03: qty 9.5

## 2017-02-03 MED ORDER — PALONOSETRON HCL INJECTION 0.25 MG/5ML
0.2500 mg | Freq: Once | INTRAVENOUS | Status: AC
  Administered 2017-02-03: 0.25 mg via INTRAVENOUS

## 2017-02-03 MED ORDER — DEXAMETHASONE SODIUM PHOSPHATE 10 MG/ML IJ SOLN
INTRAMUSCULAR | Status: AC
  Filled 2017-02-03: qty 1

## 2017-02-03 MED ORDER — HEPARIN SOD (PORK) LOCK FLUSH 100 UNIT/ML IV SOLN
500.0000 [IU] | Freq: Once | INTRAVENOUS | Status: DC | PRN
  Filled 2017-02-03: qty 5

## 2017-02-03 MED ORDER — HYDROCODONE-ACETAMINOPHEN 5-325 MG PO TABS
1.0000 | ORAL_TABLET | Freq: Once | ORAL | Status: AC
  Administered 2017-02-03: 1 via ORAL

## 2017-02-03 MED ORDER — SODIUM CHLORIDE 0.9% FLUSH
10.0000 mL | INTRAVENOUS | Status: DC | PRN
  Filled 2017-02-03: qty 10

## 2017-02-03 MED ORDER — SODIUM CHLORIDE 0.9 % IV SOLN
480.0000 mg | Freq: Once | INTRAVENOUS | Status: AC
  Administered 2017-02-03: 480 mg via INTRAVENOUS
  Filled 2017-02-03: qty 48

## 2017-02-03 MED ORDER — HYDROCODONE-ACETAMINOPHEN 5-325 MG PO TABS
ORAL_TABLET | ORAL | Status: AC
  Filled 2017-02-03: qty 1

## 2017-02-03 MED ORDER — SODIUM CHLORIDE 0.9 % IV SOLN
Freq: Once | INTRAVENOUS | Status: AC
  Administered 2017-02-03: 13:00:00 via INTRAVENOUS

## 2017-02-03 MED ORDER — DEXAMETHASONE SODIUM PHOSPHATE 10 MG/ML IJ SOLN
10.0000 mg | Freq: Once | INTRAMUSCULAR | Status: AC
  Administered 2017-02-03: 10 mg via INTRAVENOUS

## 2017-02-03 MED ORDER — PALONOSETRON HCL INJECTION 0.25 MG/5ML
INTRAVENOUS | Status: AC
  Filled 2017-02-03: qty 5

## 2017-02-03 NOTE — Progress Notes (Signed)
[facility] [phone]   [phone]  OFFICE PROGRESS NOTE  Glo Herring., MD [name]  DIAGNOSIS: Limited stage (T1b, N2, M0) small cell lung cancer presented with left lower lobe/infrahilar mass and large mediastinal lymphadenopathy diagnosed in October 2017.  PRIOR THERAPY:Systemic chemotherapy with cisplatin 60 MG/M2 on day 1 and etoposide 120 MG/M2 on days 1, 2 and 3 status post 1 cycle. This was discontinued secondary to intolerance.  CURRENT THERAPY: Systemic chemotherapy with carboplatin for AUC of 4 on day 1 and etoposide 100 MG/M2 on days 1, 2 and 3 with Neulasta support on day 4 every 3 weeks. Status post 4 cycles. This was concurrent with radiation in [place], [place] HISTORY: Rebekah Henderson 61 y.o. female came to the clinic today for follow-up visit. The patient tolerated the last cycle of her treatment well except for chemotherapy-induced pancytopenia requiring PRBCs transfusion and platelet transfusion. She is feeling much better today except for aching pain and headache. She denied having any fever or chills. She has no chest pain, shortness of breath, cough or hemoptysis. She denied having any significant weight loss or night sweats. She has no nausea, vomiting, diarrhea or constipation. She is here today for evaluation before starting cycle #5.  MEDICAL HISTORY: Past Medical History:  Diagnosis Date  . 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 Anxiety    takes Prozac daily  . Arthritis   . Arthritis   . Cancer ([place])    skin, lung  . Colon polyps   . COPD (chronic obstructive pulmonary disease) ([place])   . Diverticulitis   . Dyspnea    with exertion  . Encounter for antineoplastic chemotherapy 12/03/2016  . Fibromyalgia   . GERD (gastroesophageal reflux disease)    takes Pantoprazole daily  . Hypertension    takes Metoprolol,Triamterene-HCTZ,and Amlodipine daily    . Hypothyroidism    takes Synthroid daily  . IBS (irritable bowel syndrome)   . PONV (postoperative nausea and vomiting)    pt also states that she had some difficulty breathing after cervical fusion    ALLERGIES:  is allergic to penicillins; codeine; ranitidine hcl; keflex [cephalexin]; and lyrica [pregabalin].  MEDICATIONS:  Current Outpatient Prescriptions  Medication Sig Dispense Refill  . albuterol (PROVENTIL) 2 MG tablet Take 2 mg by mouth daily as needed for wheezing or shortness of breath.     Marland Kitchen amLODipine (NORVASC) 5 MG tablet Take 5 mg by mouth daily.      . Dicyclomine HCl (BENTYL PO) Take 1 tablet by mouth 3 (three) times daily as needed (IBS).    Marland Kitchen estazolam (PROSOM) 2 MG tablet Take 2 mg by mouth at bedtime.    Marland Kitchen estradiol (ESTRACE) 2 MG tablet Take 2 mg by mouth daily.      Marland Kitchen FLUoxetine (PROZAC) 40 MG capsule Take 40 mg by mouth daily.   2  . HYDROcodone-acetaminophen (NORCO/VICODIN) 5-325 MG tablet Take 1 tablet by mouth every 4 (four) hours as needed for moderate pain.   0  . levothyroxine (SYNTHROID, LEVOTHROID) 25 MCG tablet Take 25 mcg by mouth daily before breakfast.     . metoprolol succinate (TOPROL-XL) 25 MG 24 hr tablet Take 25 mg by mouth daily.    . Multiple Vitamin (MULTIVITAMIN WITH MINERALS) TABS tablet Take 2 tablets by mouth daily.    . pantoprazole (PROTONIX) 40 MG tablet Take 40 mg by mouth 2 (two) times daily before a  meal.   10  . potassium chloride SA (K-DUR,KLOR-CON) 20 MEQ tablet Take 2 tablets (40 mEq total) by mouth daily. 30 tablet 2  . PROAIR HFA 108 (90 BASE) MCG/ACT inhaler Inhale 1-2 puffs into the lungs every 4 (four) hours as needed.   0  . loperamide (IMODIUM) 2 MG capsule Take 2 mg by mouth every 2 (two) hours as needed for diarrhea or loose stools.    . magnesium oxide (MAG-OX) 400 (241.3 Mg) MG tablet Take 2 tablets (800 mg total) by mouth 2 (two) times daily. 120 tablet 3  . ondansetron (ZOFRAN) 8 MG tablet TAKE (1) TABLET BY MOUTH  THREE TIMES DAILY AS NEEDED. (Patient not taking: Reported on 02/03/2017) 30 tablet 0   No current facility-administered medications for this visit.     SURGICAL HISTORY:  Past Surgical History:  Procedure Laterality Date  . BIOPSY N/A 05/25/2013   Procedure: BIOPSIES (Random Colon; Duodenal; Gastric);  Surgeon: Danie Binder, MD;  Location: [facility];  Service: Endoscopy;  Laterality: N/A;  . BLADDER SUSPENSION    . BREAST ENHANCEMENT SURGERY    . BREAST IMPLANT REMOVAL    . CERVICAL FUSION  AUG 2013  . CHOLECYSTECTOMY  1999  . COLONOSCOPY  2007 [facility]   POLYPS  . COLONOSCOPY WITH PROPOFOL N/A 05/25/2013   Procedure: COLONOSCOPY WITH PROPOFOL(at cecum 0957) total withdrawal time=67mn);  Surgeon: SDanie Binder MD;  Location: [facility];  Service: Endoscopy;  Laterality: N/A;  . ESOPHAGOGASTRODUODENOSCOPY (EGD) WITH PROPOFOL N/A 05/25/2013   Procedure: ESOPHAGOGASTRODUODENOSCOPY (EGD) WITH PROPOFOL;  Surgeon: SDanie Binder MD;  Location: [facility];  Service: Endoscopy;  Laterality: N/A;  . FOOT SURGERY    . POLYPECTOMY N/A 05/25/2013   Procedure: POLYPECTOMY (Rectal and Gastric);  Surgeon: SDanie Binder MD;  Location: [facility];  Service: Endoscopy;  Laterality: N/A;  . TONSILLECTOMY    . UPPER GASTROINTESTINAL ENDOSCOPY    . VIDEO BRONCHOSCOPY WITH ENDOBRONCHIAL ULTRASOUND  09/12/2016   Procedure: VIDEO BRONCHOSCOPY WITH ENDOBRONCHIAL ULTRASOUND AND BIOPSY;  Surgeon: DJuanito Doom MD;  Location: [facility];  Service: Cardiopulmonary;;    REVIEW OF SYSTEMS:  A comprehensive review of systems was negative except for: Musculoskeletal: positive for arthralgias   PHYSICAL EXAMINATION: General appearance: alert, cooperative and no distress Head: Normocephalic, without obvious abnormality, atraumatic Neck: no adenopathy, no JVD, supple, symmetrical, trachea midline and thyroid not enlarged, symmetric, no tenderness/mass/nodules Lymph nodes: Cervical, supraclavicular, and axillary nodes  normal. Resp: clear to auscultation bilaterally Back: symmetric, no curvature. ROM normal. No CVA tenderness. Cardio: regular rate and rhythm, S1, S2 normal, no murmur, click, rub or gallop GI: soft, non-tender; bowel sounds normal; no masses,  no organomegaly Extremities: extremities normal, atraumatic, no cyanosis or edema   ECOG PERFORMANCE STATUS: 1 - Symptomatic but completely ambulatory  Blood pressure (!) 134/94, pulse 80, temperature 97.7 F (36.5 C), temperature source Oral, resp. rate 18, height '5\' 3"'$  (1.6 m), weight 172 lb 6.4 oz (78.2 kg), SpO2 94 %.  LABORATORY DATA: Lab Results  Component Value Date   WBC 7.8 02/03/2017   HGB 12.1 02/03/2017   HCT 35.3 02/03/2017   MCV 86.2 02/03/2017   PLT 134 (L) 02/03/2017      Chemistry      Component Value Date/Time   NA 139 02/03/2017 1130   K 4.2 02/03/2017 1130   CL 104 01/27/2017 1415   CO2 25 02/03/2017 1130   BUN 11.2 02/03/2017 1130   CREATININE 0.7 02/03/2017 1130  Component Value Date/Time   CALCIUM 9.7 02/03/2017 1130   ALKPHOS 54 02/03/2017 1130   AST 18 02/03/2017 1130   ALT 9 02/03/2017 1130   BILITOT 0.73 02/03/2017 1130       RADIOGRAPHIC STUDIES: Ct Chest W Contrast  Result Date: 01/06/2017 CLINICAL DATA:  Subsequent treatment strategy lung carcinoma EXAM: CT CHEST WITH CONTRAST TECHNIQUE: Multidetector CT imaging of the chest was performed during intravenous contrast administration. CONTRAST:  12m ISOVUE-300 IOPAMIDOL (ISOVUE-300) INJECTION 61% COMPARISON:  PET-CT 09/27/2016 FINDINGS: Cardiovascular: No significant vascular findings. Normal heart size. No pericardial effusion. Mediastinum/Nodes: No axillary or supraclavicular lymphadenopathy. Large AP window nodal mass is decreased in size measuring 3.2 x 3.2 cm compared to 7.0 x 5.3 cm on PET-CT scan 09/27/2016. No new mediastinal adenopathy. Lungs/Pleura: Reduction in size of LEFT lower lobe perihilar mass. No measurable mass remains. Scattered  subpleural nodules are not changed. Upper Abdomen: Limited view of the liver, kidneys, pancreas are unremarkable. Normal adrenal glands. Musculoskeletal: No aggressive osseous lesion. IMPRESSION: 1. Interval decrease in size of LEFT lower lobe perihilar mass which is no longer measurable. 2. Interval decrease in size of AP window nodal mass. 3. No evidence disease progression. Electronically Signed   By: SSuzy BouchardM.D.   On: 01/06/2017 10:50    ASSESSMENT AND PLAN:  This is a very pleasant 61years old white female with limited stage small cell lung cancer status post 1 cycle of systemic chemotherapy with cisplatin and etoposide discontinued secondary to intolerance and she is currently on systemic chemotherapy with carboplatin and etoposide status post 4 cycles. She is tolerating this treatment well. I recommended for her to proceed with cycle #5 today as scheduled. I will see her back for follow-up visit in one month for evaluation after repeating CT scan of the chest for restaging of her disease. For the chemotherapy-induced pancytopenia, we will continue the patient on transfusion if needed. She was advised to call immediately if she has any concerning symptoms in the interval. The patient voices understanding of current disease status and treatment options and is in agreement with the current care plan.  All questions were answered. The patient knows to call the clinic with any problems, questions or concerns. We can certainly see the patient much sooner if necessary. I spent 10 minutes counseling the patient face to face. The total time spent in the appointment was 15 minutes.  Disclaimer: This note was dictated with voice recognition software. Similar sounding words can inadvertently be transcribed and may not be corrected upon review.

## 2017-02-03 NOTE — Patient Instructions (Signed)
[facility] Discharge Instructions for Patients Receiving Chemotherapy  Today you received the following chemotherapy agents Carboplatin and Etoposide  To help prevent nausea and vomiting after your treatment, we encourage you to take your nausea medication as directed If you develop nausea and vomiting that is not controlled by your nausea medication, call the clinic.   BELOW ARE SYMPTOMS THAT SHOULD BE REPORTED IMMEDIATELY:  *FEVER GREATER THAN 100.5 F  *CHILLS WITH OR WITHOUT FEVER  NAUSEA AND VOMITING THAT IS NOT CONTROLLED WITH YOUR NAUSEA MEDICATION  *UNUSUAL SHORTNESS OF BREATH  *UNUSUAL BRUISING OR BLEEDING  TENDERNESS IN MOUTH AND THROAT WITH OR WITHOUT PRESENCE OF ULCERS  *URINARY PROBLEMS  *BOWEL PROBLEMS  UNUSUAL RASH Items with * indicate a potential emergency and should be followed up as soon as possible.  Feel free to call the clinic you have any questions or concerns. The clinic phone number is [phone].  Please show the CHEMO ALERT CARD at check-in to the Emergency Department and triage nurse.   

## 2017-02-04 ENCOUNTER — Ambulatory Visit (HOSPITAL_BASED_OUTPATIENT_CLINIC_OR_DEPARTMENT_OTHER): Payer: Medicare Other

## 2017-02-04 VITALS — BP 120/73 | HR 73 | Temp 98.1°F | Resp 18

## 2017-02-04 DIAGNOSIS — Z5111 Encounter for antineoplastic chemotherapy: Secondary | ICD-10-CM

## 2017-02-04 DIAGNOSIS — C3432 Malignant neoplasm of lower lobe, left bronchus or lung: Secondary | ICD-10-CM | POA: Diagnosis not present

## 2017-02-04 DIAGNOSIS — C3492 Malignant neoplasm of unspecified part of left bronchus or lung: Secondary | ICD-10-CM

## 2017-02-04 MED ORDER — SODIUM CHLORIDE 0.9 % IV SOLN
100.0000 mg/m2 | Freq: Once | INTRAVENOUS | Status: AC
  Administered 2017-02-04: 190 mg via INTRAVENOUS
  Filled 2017-02-04: qty 9.5

## 2017-02-04 MED ORDER — SODIUM CHLORIDE 0.9 % IV SOLN
Freq: Once | INTRAVENOUS | Status: AC
  Administered 2017-02-04: 13:00:00 via INTRAVENOUS

## 2017-02-04 MED ORDER — DEXAMETHASONE SODIUM PHOSPHATE 10 MG/ML IJ SOLN
10.0000 mg | Freq: Once | INTRAMUSCULAR | Status: AC
  Administered 2017-02-04: 10 mg via INTRAVENOUS

## 2017-02-04 MED ORDER — DEXAMETHASONE SODIUM PHOSPHATE 10 MG/ML IJ SOLN
INTRAMUSCULAR | Status: AC
  Filled 2017-02-04: qty 1

## 2017-02-04 NOTE — Patient Instructions (Signed)
[facility] Discharge Instructions for Patients Receiving Chemotherapy  Today you received the following chemotherapy agents: Etoposide   To help prevent nausea and vomiting after your treatment, we encourage you to take your nausea medication as directed.    If you develop nausea and vomiting that is not controlled by your nausea medication, call the clinic.   BELOW ARE SYMPTOMS THAT SHOULD BE REPORTED IMMEDIATELY:  *FEVER GREATER THAN 100.5 F  *CHILLS WITH OR WITHOUT FEVER  NAUSEA AND VOMITING THAT IS NOT CONTROLLED WITH YOUR NAUSEA MEDICATION  *UNUSUAL SHORTNESS OF BREATH  *UNUSUAL BRUISING OR BLEEDING  TENDERNESS IN MOUTH AND THROAT WITH OR WITHOUT PRESENCE OF ULCERS  *URINARY PROBLEMS  *BOWEL PROBLEMS  UNUSUAL RASH Items with * indicate a potential emergency and should be followed up as soon as possible.  Feel free to call the clinic you have any questions or concerns. The clinic phone number is [phone].  Please show the CHEMO ALERT CARD at check-in to the Emergency Department and triage nurse.   

## 2017-02-05 ENCOUNTER — Ambulatory Visit (HOSPITAL_BASED_OUTPATIENT_CLINIC_OR_DEPARTMENT_OTHER): Payer: Medicare Other

## 2017-02-05 DIAGNOSIS — C3432 Malignant neoplasm of lower lobe, left bronchus or lung: Secondary | ICD-10-CM

## 2017-02-05 DIAGNOSIS — Z5189 Encounter for other specified aftercare: Secondary | ICD-10-CM | POA: Diagnosis not present

## 2017-02-05 DIAGNOSIS — C3492 Malignant neoplasm of unspecified part of left bronchus or lung: Secondary | ICD-10-CM

## 2017-02-05 DIAGNOSIS — Z5111 Encounter for antineoplastic chemotherapy: Secondary | ICD-10-CM

## 2017-02-05 MED ORDER — PEGFILGRASTIM 6 MG/0.6ML ~~LOC~~ PSKT
6.0000 mg | PREFILLED_SYRINGE | Freq: Once | SUBCUTANEOUS | Status: AC
  Administered 2017-02-05: 6 mg via SUBCUTANEOUS
  Filled 2017-02-05: qty 0.6

## 2017-02-05 MED ORDER — DEXAMETHASONE SODIUM PHOSPHATE 10 MG/ML IJ SOLN
10.0000 mg | Freq: Once | INTRAMUSCULAR | Status: AC
Start: 2017-02-05 — End: 2017-02-05
  Administered 2017-02-05: 10 mg via INTRAVENOUS

## 2017-02-05 MED ORDER — SODIUM CHLORIDE 0.9 % IV SOLN
Freq: Once | INTRAVENOUS | Status: AC
  Administered 2017-02-05: 14:00:00 via INTRAVENOUS

## 2017-02-05 MED ORDER — DEXAMETHASONE SODIUM PHOSPHATE 10 MG/ML IJ SOLN
INTRAMUSCULAR | Status: AC
  Filled 2017-02-05: qty 1

## 2017-02-05 MED ORDER — SODIUM CHLORIDE 0.9 % IV SOLN
100.0000 mg/m2 | Freq: Once | INTRAVENOUS | Status: AC
  Administered 2017-02-05: 190 mg via INTRAVENOUS
  Filled 2017-02-05: qty 9.5

## 2017-02-05 NOTE — Progress Notes (Signed)
Counseling intern visited with patient and her friend during infusion. Patient was alert, oriented, and in good spirits, and she engaged clearly and cheerfully with counselor throughout visit. Patient was in a good mood, as today is her last treatment until her next progress scan.  Patient, friend, and counselor reflected on patient's journey thus far. Counselor affirmed patient's relying on her faith and emotional strength to get her through the rough times. Counselor also affirmed patient's giving and receiving support from friends and her family. Counselor presented patient with a prayer shawl to provide comfort and to celebrate her progress. Counselor will follow-up with patient at her next hospital visit as schedules permit.  Lamount Cohen, Counseling Intern Supervisor - Scientist, research (physical sciences)

## 2017-02-07 ENCOUNTER — Ambulatory Visit: Payer: Medicare Other

## 2017-02-10 ENCOUNTER — Other Ambulatory Visit: Payer: Medicare Other

## 2017-02-10 ENCOUNTER — Other Ambulatory Visit (HOSPITAL_COMMUNITY)
Admission: RE | Admit: 2017-02-10 | Discharge: 2017-02-10 | Disposition: A | Discharge status: Home / Self Care | Payer: Medicare Other | Source: Ambulatory Visit | Attending: Internal Medicine | Admitting: Internal Medicine

## 2017-02-10 DIAGNOSIS — C3490 Malignant neoplasm of unspecified part of unspecified bronchus or lung: Secondary | ICD-10-CM | POA: Insufficient documentation

## 2017-02-10 LAB — COMPREHENSIVE METABOLIC PANEL
ALT: 10 U/L — ABNORMAL LOW (ref 14–54)
ANION GAP: 11 (ref 5–15)
AST: 15 U/L (ref 15–41)
Albumin: 3.9 g/dL (ref 3.5–5.0)
Alkaline Phosphatase: 63 U/L (ref 38–126)
BILIRUBIN TOTAL: 1.4 mg/dL — AB (ref 0.3–1.2)
BUN: 17 mg/dL (ref 6–20)
CO2: 29 mmol/L (ref 22–32)
Calcium: 9 mg/dL (ref 8.9–10.3)
Chloride: 99 mmol/L — ABNORMAL LOW (ref 101–111)
Creatinine, Ser: 0.84 mg/dL (ref 0.44–1.00)
GFR calc Af Amer: >60 mL/min (ref >60)
Glucose, Bld: 104 mg/dL — ABNORMAL HIGH (ref 65–99)
POTASSIUM: 3.4 mmol/L — AB (ref 3.5–5.1)
Sodium: 139 mmol/L (ref 135–145)
TOTAL PROTEIN: 7.1 g/dL (ref 6.5–8.1)

## 2017-02-10 LAB — CBC WITH DIFFERENTIAL/PLATELET
Basophils Absolute: 0 10*3/uL (ref 0.0–0.1)
Basophils Relative: 0 %
EOS PCT: 0 %
Eosinophils Absolute: 0 10*3/uL (ref 0.0–0.7)
HEMATOCRIT: 30.3 % — AB (ref 36.0–46.0)
Hemoglobin: 10.7 g/dL — ABNORMAL LOW (ref 12.0–15.0)
LYMPHS ABS: 1.4 10*3/uL (ref 0.7–4.0)
Lymphocytes Relative: 30 %
MCH: 30.3 pg (ref 26.0–34.0)
MCHC: 35.3 g/dL (ref 30.0–36.0)
MCV: 85.8 fL (ref 78.0–100.0)
MONO ABS: 0.4 10*3/uL (ref 0.1–1.0)
Monocytes Relative: 9 %
NEUTROS ABS: 2.7 10*3/uL (ref 1.7–7.7)
Neutrophils Relative %: 61 %
PLATELETS: 78 10*3/uL — AB (ref 150–400)
RBC: 3.53 MIL/uL — ABNORMAL LOW (ref 3.87–5.11)
RDW: 17.1 % — AB (ref 11.5–15.5)
WBC Morphology: INCREASED
WBC: 4.5 10*3/uL (ref 4.0–10.5)

## 2017-02-13 ENCOUNTER — Telehealth: Payer: Self-pay

## 2017-02-13 NOTE — Telephone Encounter (Signed)
Pt called for lab results from [facility] on Monday. She is getting weak and nauseated.  Called her back. Pt has hx of blood transfusions after chemo. She is concerned about this. She had chemo last Wednesday. The labs results were discussed with patient. Her nausea medication is controlling the nausea.  Instructed her to keep lab appt next Monday. If she has a rapid decline over weekend to go to ER. She thanked this Therapist, sports for easing her mind, she gets anxious at times.

## 2017-02-17 ENCOUNTER — Other Ambulatory Visit: Payer: Self-pay | Admitting: Medical Oncology

## 2017-02-17 ENCOUNTER — Other Ambulatory Visit: Payer: Medicare Other

## 2017-02-17 ENCOUNTER — Encounter (HOSPITAL_COMMUNITY)
Admission: RE | Admit: 2017-02-17 | Discharge: 2017-02-17 | Disposition: A | Discharge status: Home / Self Care | Payer: Medicare Other | Source: Ambulatory Visit | Attending: Internal Medicine | Admitting: Internal Medicine

## 2017-02-17 ENCOUNTER — Telehealth: Payer: Self-pay | Admitting: Internal Medicine

## 2017-02-17 ENCOUNTER — Ambulatory Visit (HOSPITAL_COMMUNITY)
Admission: RE | Admit: 2017-02-17 | Discharge: 2017-02-17 | Disposition: A | Discharge status: Home / Self Care | Payer: Medicare Other | Source: Ambulatory Visit | Attending: Internal Medicine | Admitting: Internal Medicine

## 2017-02-17 ENCOUNTER — Telehealth: Payer: Self-pay | Admitting: *Deleted

## 2017-02-17 DIAGNOSIS — D649 Anemia, unspecified: Secondary | ICD-10-CM | POA: Insufficient documentation

## 2017-02-17 DIAGNOSIS — C3490 Malignant neoplasm of unspecified part of unspecified bronchus or lung: Secondary | ICD-10-CM | POA: Insufficient documentation

## 2017-02-17 LAB — CBC WITH DIFFERENTIAL/PLATELET
BASOS ABS: 0 10*3/uL (ref 0.0–0.1)
Basophils Relative: 0 %
EOS ABS: 0 10*3/uL (ref 0.0–0.7)
Eosinophils Relative: 0 %
HCT: 23.6 % — ABNORMAL LOW (ref 36.0–46.0)
Hemoglobin: 8 g/dL — ABNORMAL LOW (ref 12.0–15.0)
Lymphocytes Relative: 36 %
Lymphs Abs: 1.6 10*3/uL (ref 0.7–4.0)
MCH: 29.3 pg (ref 26.0–34.0)
MCHC: 33.9 g/dL (ref 30.0–36.0)
MCV: 86.4 fL (ref 78.0–100.0)
Monocytes Absolute: 0.6 10*3/uL (ref 0.1–1.0)
Monocytes Relative: 14 %
NEUTROS ABS: 2.3 10*3/uL (ref 1.7–7.7)
Neutrophils Relative %: 50 %
Platelets: 18 10*3/uL — CL (ref 150–400)
RBC: 2.73 MIL/uL — AB (ref 3.87–5.11)
RDW: 15.8 % — ABNORMAL HIGH (ref 11.5–15.5)
WBC: 4.5 10*3/uL (ref 4.0–10.5)

## 2017-02-17 LAB — COMPREHENSIVE METABOLIC PANEL
ALBUMIN: 3.7 g/dL (ref 3.5–5.0)
ALK PHOS: 54 U/L (ref 38–126)
ALT: 10 U/L — ABNORMAL LOW (ref 14–54)
AST: 15 U/L (ref 15–41)
Anion gap: 10 (ref 5–15)
BILIRUBIN TOTAL: 0.7 mg/dL (ref 0.3–1.2)
BUN: 9 mg/dL (ref 6–20)
CALCIUM: 8.6 mg/dL — AB (ref 8.9–10.3)
CO2: 28 mmol/L (ref 22–32)
Chloride: 101 mmol/L (ref 101–111)
Creatinine, Ser: 0.65 mg/dL (ref 0.44–1.00)
GLUCOSE: 98 mg/dL (ref 65–99)
Potassium: 3.3 mmol/L — ABNORMAL LOW (ref 3.5–5.1)
Sodium: 139 mmol/L (ref 135–145)
TOTAL PROTEIN: 6.8 g/dL (ref 6.5–8.1)

## 2017-02-17 NOTE — Telephone Encounter (Signed)
"  [facility] calling to report critical lab results for this patient.  Platelet count = 18."  Called collaborative, this information left on voice mail.

## 2017-02-17 NOTE — Telephone Encounter (Signed)
sw pt to confirm 3/20 appts per LOS

## 2017-02-17 NOTE — Telephone Encounter (Signed)
Called pt with lab results. Order sent fo type and cross and 2 units of blood tomorrow . She denies any bleeding.

## 2017-02-18 ENCOUNTER — Other Ambulatory Visit (HOSPITAL_BASED_OUTPATIENT_CLINIC_OR_DEPARTMENT_OTHER): Payer: Medicare Other

## 2017-02-18 ENCOUNTER — Ambulatory Visit (HOSPITAL_COMMUNITY)
Admission: RE | Admit: 2017-02-18 | Discharge: 2017-02-18 | Disposition: A | Discharge status: Home / Self Care | Payer: Medicare Other | Source: Ambulatory Visit | Attending: Internal Medicine | Admitting: Internal Medicine

## 2017-02-18 DIAGNOSIS — D649 Anemia, unspecified: Secondary | ICD-10-CM | POA: Diagnosis present

## 2017-02-18 DIAGNOSIS — C3432 Malignant neoplasm of lower lobe, left bronchus or lung: Secondary | ICD-10-CM | POA: Diagnosis not present

## 2017-02-18 DIAGNOSIS — C3492 Malignant neoplasm of unspecified part of left bronchus or lung: Secondary | ICD-10-CM

## 2017-02-18 LAB — PREPARE RBC (CROSSMATCH)

## 2017-02-18 MED ORDER — SODIUM CHLORIDE 0.9 % IV SOLN
250.0000 mL | Freq: Once | INTRAVENOUS | Status: AC
  Administered 2017-02-18: 250 mL via INTRAVENOUS

## 2017-02-18 MED ORDER — ACETAMINOPHEN 325 MG PO TABS
650.0000 mg | ORAL_TABLET | Freq: Once | ORAL | Status: AC
  Administered 2017-02-18: 650 mg via ORAL
  Filled 2017-02-18: qty 2

## 2017-02-18 MED ORDER — DIPHENHYDRAMINE HCL 25 MG PO CAPS
25.0000 mg | ORAL_CAPSULE | Freq: Once | ORAL | Status: AC
  Administered 2017-02-18: 25 mg via ORAL
  Filled 2017-02-18: qty 1

## 2017-02-18 NOTE — Procedures (Signed)
[facility]  Procedure Note  Rebekah Henderson LDJ:[id number] DOB: Sep 12, 1956 DOA: 02/18/2017  Dr. Julien Nordmann  Associated Diagnosis: symptomatic anemia  Procedure Note: Iv started, 2 units PRBC's transfused per order, IV flushed and removed without difficulty   Condition During Procedure: patient stable   Condition at Discharge: patient stable.  Family downstairs for transport.   Roberto Scales, RN  [facility]

## 2017-02-18 NOTE — Discharge Instructions (Signed)

## 2017-02-19 LAB — BPAM RBC
BLOOD PRODUCT EXPIRATION DATE: 201804112359
Blood Product Expiration Date: 201804112359
ISSUE DATE / TIME: 201803201241
ISSUE DATE / TIME: 201803201241
UNIT TYPE AND RH: 5100
Unit Type and Rh: 5100

## 2017-02-19 LAB — TYPE AND SCREEN
ABO/RH(D): O POS
ANTIBODY SCREEN: NEGATIVE
UNIT DIVISION: 0
Unit division: 0

## 2017-02-24 ENCOUNTER — Ambulatory Visit: Payer: Medicare Other | Admitting: Internal Medicine

## 2017-02-24 ENCOUNTER — Ambulatory Visit: Payer: Medicare Other

## 2017-02-24 ENCOUNTER — Other Ambulatory Visit: Payer: Medicare Other

## 2017-02-24 ENCOUNTER — Encounter (HOSPITAL_COMMUNITY)
Admission: RE | Admit: 2017-02-24 | Discharge: 2017-02-24 | Disposition: A | Discharge status: Home / Self Care | Payer: Medicare Other | Source: Ambulatory Visit | Attending: Internal Medicine | Admitting: Internal Medicine

## 2017-02-24 DIAGNOSIS — C3490 Malignant neoplasm of unspecified part of unspecified bronchus or lung: Secondary | ICD-10-CM | POA: Diagnosis not present

## 2017-02-24 LAB — CBC WITH DIFFERENTIAL/PLATELET
BASOS PCT: 0 %
Basophils Absolute: 0 10*3/uL (ref 0.0–0.1)
Eosinophils Absolute: 0 10*3/uL (ref 0.0–0.7)
Eosinophils Relative: 0 %
HEMATOCRIT: 32.2 % — AB (ref 36.0–46.0)
HEMOGLOBIN: 11.1 g/dL — AB (ref 12.0–15.0)
LYMPHS ABS: 1.5 10*3/uL (ref 0.7–4.0)
Lymphocytes Relative: 21 %
MCH: 30.5 pg (ref 26.0–34.0)
MCHC: 34.5 g/dL (ref 30.0–36.0)
MCV: 88.5 fL (ref 78.0–100.0)
MONO ABS: 1.1 10*3/uL — AB (ref 0.1–1.0)
MONOS PCT: 16 %
NEUTROS ABS: 4.3 10*3/uL (ref 1.7–7.7)
Neutrophils Relative %: 62 %
Platelets: 116 10*3/uL — ABNORMAL LOW (ref 150–400)
RBC: 3.64 MIL/uL — ABNORMAL LOW (ref 3.87–5.11)
RDW: 17.7 % — AB (ref 11.5–15.5)
WBC: 6.9 10*3/uL (ref 4.0–10.5)

## 2017-02-24 LAB — COMPREHENSIVE METABOLIC PANEL
ALBUMIN: 3.7 g/dL (ref 3.5–5.0)
ALK PHOS: 49 U/L (ref 38–126)
ALT: 8 U/L — ABNORMAL LOW (ref 14–54)
ANION GAP: 10 (ref 5–15)
AST: 17 U/L (ref 15–41)
BILIRUBIN TOTAL: 0.6 mg/dL (ref 0.3–1.2)
BUN: 5 mg/dL — AB (ref 6–20)
CALCIUM: 8.9 mg/dL (ref 8.9–10.3)
CO2: 24 mmol/L (ref 22–32)
Chloride: 100 mmol/L — ABNORMAL LOW (ref 101–111)
Creatinine, Ser: 0.64 mg/dL (ref 0.44–1.00)
GLUCOSE: 109 mg/dL — AB (ref 65–99)
Potassium: 3.6 mmol/L (ref 3.5–5.1)
Sodium: 134 mmol/L — ABNORMAL LOW (ref 135–145)
Total Protein: 7.2 g/dL (ref 6.5–8.1)

## 2017-02-25 ENCOUNTER — Ambulatory Visit: Payer: Medicare Other

## 2017-02-26 ENCOUNTER — Ambulatory Visit: Payer: Medicare Other

## 2017-02-26 ENCOUNTER — Telehealth: Payer: Self-pay | Admitting: Internal Medicine

## 2017-02-26 NOTE — Telephone Encounter (Signed)
Called patient to inform her of next scheduled appointments.  

## 2017-02-28 ENCOUNTER — Ambulatory Visit: Payer: Medicare Other

## 2017-03-01 DIAGNOSIS — J449 Chronic obstructive pulmonary disease, unspecified: Secondary | ICD-10-CM | POA: Diagnosis not present

## 2017-03-01 DIAGNOSIS — R0902 Hypoxemia: Secondary | ICD-10-CM | POA: Diagnosis not present

## 2017-03-03 ENCOUNTER — Other Ambulatory Visit: Payer: Medicare Other

## 2017-03-03 ENCOUNTER — Telehealth: Payer: Self-pay

## 2017-03-03 ENCOUNTER — Ambulatory Visit (HOSPITAL_COMMUNITY): Payer: Medicare Other

## 2017-03-03 ENCOUNTER — Encounter (HOSPITAL_COMMUNITY): Payer: Self-pay | Admitting: Emergency Medicine

## 2017-03-03 ENCOUNTER — Telehealth: Payer: Self-pay | Admitting: Medical Oncology

## 2017-03-03 ENCOUNTER — Emergency Department (HOSPITAL_COMMUNITY)
Admission: EM | Admit: 2017-03-03 | Discharge: 2017-03-03 | Disposition: A | Discharge status: Home / Self Care | Payer: Medicare Other | Attending: Emergency Medicine | Admitting: Emergency Medicine

## 2017-03-03 ENCOUNTER — Encounter (HOSPITAL_COMMUNITY): Payer: Self-pay

## 2017-03-03 ENCOUNTER — Other Ambulatory Visit: Payer: Self-pay | Admitting: Internal Medicine

## 2017-03-03 ENCOUNTER — Emergency Department (HOSPITAL_COMMUNITY): Payer: Medicare Other

## 2017-03-03 DIAGNOSIS — R110 Nausea: Secondary | ICD-10-CM | POA: Diagnosis not present

## 2017-03-03 DIAGNOSIS — R109 Unspecified abdominal pain: Secondary | ICD-10-CM | POA: Diagnosis not present

## 2017-03-03 DIAGNOSIS — Z79899 Other long term (current) drug therapy: Secondary | ICD-10-CM | POA: Diagnosis not present

## 2017-03-03 DIAGNOSIS — I10 Essential (primary) hypertension: Secondary | ICD-10-CM | POA: Insufficient documentation

## 2017-03-03 DIAGNOSIS — D591 Other autoimmune hemolytic anemias: Secondary | ICD-10-CM | POA: Diagnosis not present

## 2017-03-03 DIAGNOSIS — E039 Hypothyroidism, unspecified: Secondary | ICD-10-CM | POA: Insufficient documentation

## 2017-03-03 DIAGNOSIS — R10814 Left lower quadrant abdominal tenderness: Secondary | ICD-10-CM | POA: Diagnosis not present

## 2017-03-03 DIAGNOSIS — Z87891 Personal history of nicotine dependence: Secondary | ICD-10-CM | POA: Diagnosis not present

## 2017-03-03 DIAGNOSIS — R0902 Hypoxemia: Secondary | ICD-10-CM | POA: Diagnosis not present

## 2017-03-03 DIAGNOSIS — R112 Nausea with vomiting, unspecified: Secondary | ICD-10-CM | POA: Diagnosis not present

## 2017-03-03 DIAGNOSIS — R061 Stridor: Secondary | ICD-10-CM | POA: Diagnosis not present

## 2017-03-03 DIAGNOSIS — D649 Anemia, unspecified: Secondary | ICD-10-CM

## 2017-03-03 DIAGNOSIS — R197 Diarrhea, unspecified: Secondary | ICD-10-CM | POA: Diagnosis not present

## 2017-03-03 DIAGNOSIS — R918 Other nonspecific abnormal finding of lung field: Secondary | ICD-10-CM | POA: Diagnosis not present

## 2017-03-03 DIAGNOSIS — J449 Chronic obstructive pulmonary disease, unspecified: Secondary | ICD-10-CM | POA: Insufficient documentation

## 2017-03-03 DIAGNOSIS — R1030 Lower abdominal pain, unspecified: Secondary | ICD-10-CM | POA: Insufficient documentation

## 2017-03-03 DIAGNOSIS — M549 Dorsalgia, unspecified: Secondary | ICD-10-CM | POA: Diagnosis not present

## 2017-03-03 LAB — CBC WITH DIFFERENTIAL/PLATELET
Basophils Absolute: 0 10*3/uL (ref 0.0–0.1)
Basophils Relative: 0 %
Eosinophils Absolute: 0 10*3/uL (ref 0.0–0.7)
Eosinophils Relative: 1 %
HCT: 24.5 % — ABNORMAL LOW (ref 36.0–46.0)
HEMOGLOBIN: 8.2 g/dL — AB (ref 12.0–15.0)
Lymphocytes Relative: 14 %
Lymphs Abs: 0.8 10*3/uL (ref 0.7–4.0)
MCH: 29.2 pg (ref 26.0–34.0)
MCHC: 33.5 g/dL (ref 30.0–36.0)
MCV: 87.2 fL (ref 78.0–100.0)
MONOS PCT: 16 %
Monocytes Absolute: 0.9 10*3/uL (ref 0.1–1.0)
NEUTROS ABS: 3.7 10*3/uL (ref 1.7–7.7)
NEUTROS PCT: 69 %
Platelets: 231 10*3/uL (ref 150–400)
RBC: 2.81 MIL/uL — ABNORMAL LOW (ref 3.87–5.11)
RDW: 18 % — ABNORMAL HIGH (ref 11.5–15.5)
WBC: 5.4 10*3/uL (ref 4.0–10.5)

## 2017-03-03 LAB — URINALYSIS, ROUTINE W REFLEX MICROSCOPIC
BILIRUBIN URINE: NEGATIVE
Glucose, UA: NEGATIVE mg/dL
HGB URINE DIPSTICK: NEGATIVE
KETONES UR: NEGATIVE mg/dL
Leukocytes, UA: NEGATIVE
Nitrite: NEGATIVE
PROTEIN: NEGATIVE mg/dL
Specific Gravity, Urine: 1.006 (ref 1.005–1.030)
pH: 7 (ref 5.0–8.0)

## 2017-03-03 LAB — COMPREHENSIVE METABOLIC PANEL
ALT: 6 U/L — ABNORMAL LOW (ref 14–54)
AST: 12 U/L — ABNORMAL LOW (ref 15–41)
Albumin: 2.9 g/dL — ABNORMAL LOW (ref 3.5–5.0)
Alkaline Phosphatase: 38 U/L (ref 38–126)
Anion gap: 7 (ref 5–15)
BILIRUBIN TOTAL: 0.8 mg/dL (ref 0.3–1.2)
BUN: 6 mg/dL (ref 6–20)
CO2: 26 mmol/L (ref 22–32)
CREATININE: 0.58 mg/dL (ref 0.44–1.00)
Calcium: 7.8 mg/dL — ABNORMAL LOW (ref 8.9–10.3)
Chloride: 103 mmol/L (ref 101–111)
GFR calc Af Amer: >60 mL/min (ref >60)
Glucose, Bld: 110 mg/dL — ABNORMAL HIGH (ref 65–99)
POTASSIUM: 3 mmol/L — AB (ref 3.5–5.1)
Sodium: 136 mmol/L (ref 135–145)
TOTAL PROTEIN: 6.3 g/dL — AB (ref 6.5–8.1)

## 2017-03-03 LAB — LIPASE, BLOOD: Lipase: 12 U/L (ref 11–51)

## 2017-03-03 LAB — I-STAT CG4 LACTIC ACID, ED: Lactic Acid, Venous: 0.95 mmol/L (ref 0.5–1.9)

## 2017-03-03 LAB — PREPARE RBC (CROSSMATCH)

## 2017-03-03 MED ORDER — HYDROMORPHONE HCL 1 MG/ML IJ SOLN
1.0000 mg | Freq: Once | INTRAMUSCULAR | Status: AC
  Administered 2017-03-03: 1 mg via INTRAVENOUS
  Filled 2017-03-03: qty 1

## 2017-03-03 MED ORDER — POTASSIUM CHLORIDE CRYS ER 20 MEQ PO TBCR
40.0000 meq | EXTENDED_RELEASE_TABLET | Freq: Once | ORAL | Status: AC
  Administered 2017-03-03: 40 meq via ORAL
  Filled 2017-03-03: qty 2

## 2017-03-03 MED ORDER — IOPAMIDOL (ISOVUE-300) INJECTION 61%
100.0000 mL | Freq: Once | INTRAVENOUS | Status: AC | PRN
  Administered 2017-03-03: 100 mL via INTRAVENOUS

## 2017-03-03 MED ORDER — IOPAMIDOL (ISOVUE-300) INJECTION 61%
INTRAVENOUS | Status: AC
  Filled 2017-03-03: qty 100

## 2017-03-03 MED ORDER — SODIUM CHLORIDE 0.9 % IV BOLUS (SEPSIS)
1000.0000 mL | Freq: Once | INTRAVENOUS | Status: AC
  Administered 2017-03-03: 1000 mL via INTRAVENOUS

## 2017-03-03 MED ORDER — OXYCODONE-ACETAMINOPHEN 5-325 MG PO TABS: 1.0000 | ORAL_TABLET | ORAL | 0 refills | Status: DC | PRN

## 2017-03-03 MED ORDER — SODIUM CHLORIDE 0.9 % IV SOLN
Freq: Once | INTRAVENOUS | Status: AC
  Administered 2017-03-03: 15:00:00 via INTRAVENOUS

## 2017-03-03 MED ORDER — ONDANSETRON HCL 4 MG/2ML IJ SOLN
4.0000 mg | Freq: Once | INTRAMUSCULAR | Status: AC
  Administered 2017-03-03: 4 mg via INTRAVENOUS
  Filled 2017-03-03: qty 2

## 2017-03-03 NOTE — ED Provider Notes (Signed)
[facility] [place] DEPT Provider Note   CSN: [id number] Arrival date & time: 03/03/17  8144     History   Chief Complaint Chief Complaint  Patient presents with  . Flank Pain    HPI Rebekah Henderson is a 61 y.o. female.  Patient complains of left flank pain. Patient has lung cancer and has been treated with chemotherapy and radiation.   The history is provided by the patient.  Flank Pain  This is a new problem. The current episode started more than 2 days ago. The problem occurs constantly. Pertinent negatives include no chest pain, no abdominal pain and no headaches. Nothing aggravates the symptoms. Nothing relieves the symptoms.    Past Medical History:  Diagnosis Date  . 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 Anxiety    takes Prozac daily  . Arthritis   . Arthritis   . Colon polyps   . COPD (chronic obstructive pulmonary disease) ([place])   . Diverticulitis   . Dyspnea    with exertion  . Encounter for antineoplastic chemotherapy 12/03/2016  . Fibromyalgia   . GERD (gastroesophageal reflux disease)    takes Pantoprazole daily  . Hypertension    takes Metoprolol,Triamterene-HCTZ,and Amlodipine daily  . Hypothyroidism    takes Synthroid daily  . IBS (irritable bowel syndrome)   . lung ca dx'd 10/02/2016   skin, lung  . PONV (postoperative nausea and vomiting)    pt also states that she had some difficulty breathing after cervical fusion    Patient Active Problem List   Diagnosis Date Noted  . Symptomatic anemia 01/04/2017  . Thrombocytopenia ([place]) 01/04/2017  . Influenza A 12/10/2016  . SIRS (systemic inflammatory response syndrome) ([place]) 12/07/2016  . Hypoxia 12/07/2016  . Bronchitis 12/07/2016  . Encounter for antineoplastic chemotherapy 12/03/2016  . 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 Protein-calorie malnutrition, severe 10/30/2016  . Vomiting 10/29/2016  . Abdominal pain 10/29/2016  . Rash and nonspecific skin eruption  10/29/2016  . Neutropenic fever ([place]) 10/15/2016  . Intractable nausea and vomiting 10/15/2016  . Pancytopenia due to antineoplastic chemotherapy ([place]) 10/15/2016  . Nodular rash 10/15/2016  . Hypokalemia 10/15/2016  . Hypomagnesemia 10/15/2016  . Chronic respiratory failure with hypoxia ([place]) 10/15/2016  . 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 Mediastinal mass 09/10/2016  . COPD (chronic obstructive pulmonary disease) ([place])   . Hypertension   . Fibromyalgia   . IBS (irritable bowel syndrome)   . GERD (gastroesophageal reflux disease)   . Hypothyroidism   . Colon polyps   . Anxiety   . PONV (postoperative nausea and vomiting)   . Diarrhea 05/12/2013    Past Surgical History:  Procedure Laterality Date  . BIOPSY N/A 05/25/2013   Procedure: BIOPSIES (Random Colon; Duodenal; Gastric);  Surgeon: Danie Binder, MD;  Location: [facility];  Service: Endoscopy;  Laterality: N/A;  . BLADDER SUSPENSION    . BREAST ENHANCEMENT SURGERY    . BREAST IMPLANT REMOVAL    . CERVICAL FUSION  AUG 2013  . CHOLECYSTECTOMY  1999  . COLONOSCOPY  2007 [place]   POLYPS  . COLONOSCOPY WITH PROPOFOL N/A 05/25/2013   Procedure: COLONOSCOPY WITH PROPOFOL(at cecum 0957) total withdrawal time=60mn);  Surgeon: SDanie Binder MD;  Location: [facility];  Service: Endoscopy;  Laterality: N/A;  . ESOPHAGOGASTRODUODENOSCOPY (EGD) WITH PROPOFOL N/A 05/25/2013   Procedure: ESOPHAGOGASTRODUODENOSCOPY (EGD) WITH PROPOFOL;  Surgeon: SDanie Binder MD;  Location: [facility];  Service: Endoscopy;  Laterality: N/A;  . FOOT  SURGERY    . POLYPECTOMY N/A 05/25/2013   Procedure: POLYPECTOMY (Rectal and Gastric);  Surgeon: Danie Binder, MD;  Location: [facility];  Service: Endoscopy;  Laterality: N/A;  . TONSILLECTOMY    . UPPER GASTROINTESTINAL ENDOSCOPY    . VIDEO BRONCHOSCOPY WITH ENDOBRONCHIAL ULTRASOUND  09/12/2016   Procedure: VIDEO BRONCHOSCOPY WITH ENDOBRONCHIAL ULTRASOUND AND BIOPSY;  Surgeon: Juanito Doom, MD;   Location: [facility];  Service: Cardiopulmonary;;    OB History    No data available       Home Medications    Prior to Admission medications   Medication Sig Start Date End Date Taking? Authorizing Provider  albuterol (PROVENTIL HFA;VENTOLIN HFA) 108 (90 Base) MCG/ACT inhaler Inhale 2 puffs into the lungs every 4 (four) hours as needed for wheezing or shortness of breath.   Yes Historical Provider, MD  albuterol (PROVENTIL) 2 MG tablet Take 2 mg by mouth daily as needed for wheezing or shortness of breath.    Yes Historical Provider, MD  amLODipine (NORVASC) 5 MG tablet Take 5 mg by mouth daily.     Yes Historical Provider, MD  dicyclomine (BENTYL) 20 MG tablet Take 20 mg by mouth 3 (three) times daily as needed for spasms.   Yes Historical Provider, MD  estazolam (PROSOM) 2 MG tablet Take 2 mg by mouth at bedtime.   Yes Historical Provider, MD  estradiol (ESTRACE) 2 MG tablet Take 2 mg by mouth daily.     Yes Historical Provider, MD  FLUoxetine (PROZAC) 40 MG capsule Take 40 mg by mouth daily.    Yes Historical Provider, MD  HYDROcodone-acetaminophen (NORCO/VICODIN) 5-325 MG tablet Take 1 tablet by mouth every 4 (four) hours as needed for moderate pain.    Yes Historical Provider, MD  levothyroxine (SYNTHROID, LEVOTHROID) 25 MCG tablet Take 25 mcg by mouth daily before breakfast.    Yes Historical Provider, MD  loperamide (IMODIUM) 2 MG capsule Take 2 mg by mouth every 2 (two) hours as needed for diarrhea or loose stools.   Yes Historical Provider, MD  magnesium oxide (MAG-OX) 400 (241.3 Mg) MG tablet Take 2 tablets (800 mg total) by mouth 2 (two) times daily. 01/06/17  Yes Reyne Dumas, MD  metoprolol succinate (TOPROL-XL) 25 MG 24 hr tablet Take 25 mg by mouth daily.   Yes Historical Provider, MD  Multiple Vitamin (MULTIVITAMIN WITH MINERALS) TABS tablet Take 2 tablets by mouth daily.   Yes Historical Provider, MD  ondansetron (ZOFRAN) 8 MG tablet Take 8 mg by mouth every 8 (eight) hours as  needed for nausea or vomiting.   Yes Historical Provider, MD  pantoprazole (PROTONIX) 40 MG tablet Take 40 mg by mouth 2 (two) times daily before a meal.    Yes Historical Provider, MD  potassium chloride SA (K-DUR,KLOR-CON) 20 MEQ tablet Take 2 tablets (40 mEq total) by mouth daily. 01/06/17  Yes Reyne Dumas, MD  triamterene-hydrochlorothiazide (MAXZIDE-25) 37.5-25 MG tablet Take 1 tablet by mouth daily.   Yes Historical Provider, MD  oxyCODONE-acetaminophen (PERCOCET) 5-325 MG tablet Take 1-2 tablets by mouth every 4 (four) hours as needed. 03/03/17   Milton Ferguson, MD    Family History Family History  Problem Relation Age of Onset  . Breast cancer Mother   . Diabetes Maternal Grandfather   . Lung cancer Father   . Heart failure Sister 63    Died. Morbidly obese  . Colon cancer Neg Hx   . Colon polyps Neg Hx     Social  History Social History  Substance Use Topics  . Smoking status: Former Smoker    Packs/day: 2.00    Years: 20.00    Quit date: 05/20/2004  . Smokeless tobacco: Never Used  . Alcohol use No     Allergies   Penicillins; Codeine; Ranitidine hcl; Keflex [cephalexin]; and Lyrica [pregabalin]   Review of Systems Review of Systems  Constitutional: Negative for appetite change and fatigue.  HENT: Negative for congestion, ear discharge and sinus pressure.   Eyes: Negative for discharge.  Respiratory: Negative for cough.   Cardiovascular: Negative for chest pain.  Gastrointestinal: Negative for abdominal pain and diarrhea.  Genitourinary: Positive for flank pain. Negative for frequency and hematuria.  Musculoskeletal: Negative for back pain.  Skin: Negative for rash.  Neurological: Negative for seizures and headaches.  Psychiatric/Behavioral: Negative for hallucinations.     Physical Exam Updated Vital Signs BP 115/64   Pulse 77   Temp 98.2 F (36.8 C) (Oral)   Resp 16   Ht '5\' 4"'$  (1.626 m)   Wt 170 lb (77.1 kg)   SpO2 96%   BMI 29.18 kg/m   Physical  Exam  Constitutional: She is oriented to person, place, and time. She appears well-developed.  HENT:  Head: Normocephalic.  Eyes: Conjunctivae and EOM are normal. No scleral icterus.  Neck: Neck supple. No thyromegaly present.  Cardiovascular: Normal rate and regular rhythm.  Exam reveals no gallop and no friction rub.   No murmur heard. Pulmonary/Chest: No stridor. She has no wheezes. She has no rales. She exhibits no tenderness.  Abdominal: She exhibits no distension. There is no tenderness. There is no rebound.  Genitourinary:  Genitourinary Comments: Left buttocks and left lower back pain.  Musculoskeletal: Normal range of motion. She exhibits no edema.  Lymphadenopathy:    She has no cervical adenopathy.  Neurological: She is oriented to person, place, and time. She exhibits normal muscle tone. Coordination normal.  Skin: No rash noted. No erythema.  Psychiatric: She has a normal mood and affect. Her behavior is normal.     ED Treatments / Results  Labs (all labs ordered are listed, but only abnormal results are displayed) Labs Reviewed  COMPREHENSIVE METABOLIC PANEL - Abnormal; Notable for the following:       Result Value   Potassium 3.0 (*)    Glucose, Bld 110 (*)    Calcium 7.8 (*)    Total Protein 6.3 (*)    Albumin 2.9 (*)    AST 12 (*)    ALT 6 (*)    All other components within normal limits  CBC WITH DIFFERENTIAL/PLATELET - Abnormal; Notable for the following:    RBC 2.81 (*)    Hemoglobin 8.2 (*)    HCT 24.5 (*)    RDW 18.0 (*)    All other components within normal limits  LIPASE, BLOOD  URINALYSIS, ROUTINE W REFLEX MICROSCOPIC  I-STAT CG4 LACTIC ACID, ED  PREPARE RBC (CROSSMATCH)  TYPE AND SCREEN    EKG  EKG Interpretation None       Radiology No results found.  Procedures Procedures (including critical care time)  Medications Ordered in ED Medications  iopamidol (ISOVUE-300) 61 % injection (not administered)  sodium chloride 0.9 % bolus  1,000 mL (0 mLs Intravenous Stopped 03/03/17 1205)  iopamidol (ISOVUE-300) 61 % injection 100 mL (100 mLs Intravenous Contrast Given 03/03/17 0855)  ondansetron (ZOFRAN) injection 4 mg (4 mg Intravenous Given 03/03/17 1201)  HYDROmorphone (DILAUDID) injection 1 mg (1 mg Intravenous  Given 03/03/17 1337)  0.9 %  sodium chloride infusion ( Intravenous New Bag/Given 03/03/17 1437)     Initial Impression / Assessment and Plan / ED Course  I have reviewed the triage vital signs and the nursing notes.  Pertinent labs & imaging results that were available during my care of the patient were reviewed by me and considered in my medical decision making (see chart for details). CRITICAL CARE Performed by: Jahaziel Francois L Total critical care time: 35 minutes Critical care time was exclusive of separately billable procedures and treating other patients. Critical care was necessary to treat or prevent imminent or life-threatening deterioration. Critical care was time spent personally by me on the following activities: development of treatment plan with patient and/or surrogate as well as nursing, discussions with consultants, evaluation of patient's response to treatment, examination of patient, obtaining history from patient or surrogate, ordering and performing treatments and interventions, ordering and review of laboratory studies, ordering and review of radiographic studies, pulse oximetry and re-evaluation of patient's condition.     Patient with lung cancer and anemia. I spoke with her oncologist Dr. Earlie Server and he requested that we transfuse the patient 1 unit in the emergency department and he will see her Thursday and will consider giving her more blood at that point. She will be given Percocets for her pain  Final Clinical Impressions(s) / ED Diagnoses   Final diagnoses:  Flank pain  Symptomatic anemia    New Prescriptions New Prescriptions   OXYCODONE-ACETAMINOPHEN (PERCOCET) 5-325 MG TABLET     Take 1-2 tablets by mouth every 4 (four) hours as needed.     Milton Ferguson, MD 03/03/17 [phone]

## 2017-03-03 NOTE — ED Notes (Signed)
CT faxed over patient's CT results. Results given to EDP.

## 2017-03-03 NOTE — ED Notes (Signed)
Patient made aware of transfusion reaction signs/symptoms. Patient verbalized understanding. RN remained with patient during the 1st 15 minutes of blood administration. Patient did not display any signs/symptoms during the 1st 15 minutes of transfusion.

## 2017-03-03 NOTE — Discharge Instructions (Signed)
Follow-up Thursday with your doctor as planned

## 2017-03-03 NOTE — ED Notes (Signed)
Discharge instructions, follow up care, and rx x1 reviewed with patient. Patient verbalized understanding.

## 2017-03-03 NOTE — ED Notes (Signed)
Patient transported to CT 

## 2017-03-03 NOTE — ED Notes (Signed)
ED Provider at bedside. 

## 2017-03-03 NOTE — ED Notes (Signed)
Bed: UY37 Expected date:  Expected time:  Means of arrival: Ambulance Comments: EMS UTI with hypotension

## 2017-03-03 NOTE — Telephone Encounter (Signed)
Pt called for zofran refill. Noted pt was in ED at present. Instructed her to ask ED MD to write an Rx.

## 2017-03-03 NOTE — Telephone Encounter (Signed)
Pt called on call service for nausea, weakness , unable to keep her head up and went to hospital by EMS

## 2017-03-03 NOTE — ED Triage Notes (Addendum)
Per EMS, patient is complaining of left flank pain, lower abdominal pain, nausea/vomiting/diarrhea, and dysuria. Hx of lung cancer. Patient usually wears O2 at night. EMS put patient on 4 L [place] due to oxygen saturation 86% on room air. Patient received 400 mL NS due to blood pressure of 88/66 intially.

## 2017-03-04 LAB — TYPE AND SCREEN
ABO/RH(D): O POS
ANTIBODY SCREEN: NEGATIVE
Unit division: 0

## 2017-03-04 LAB — BPAM RBC
Blood Product Expiration Date: 201804232359
ISSUE DATE / TIME: 201804021427
Unit Type and Rh: 5100

## 2017-03-06 ENCOUNTER — Ambulatory Visit (HOSPITAL_BASED_OUTPATIENT_CLINIC_OR_DEPARTMENT_OTHER): Payer: Medicare Other | Admitting: Internal Medicine

## 2017-03-06 ENCOUNTER — Ambulatory Visit (HOSPITAL_BASED_OUTPATIENT_CLINIC_OR_DEPARTMENT_OTHER): Payer: Medicare Other | Admitting: Nurse Practitioner

## 2017-03-06 ENCOUNTER — Other Ambulatory Visit: Payer: Self-pay | Admitting: Medical Oncology

## 2017-03-06 ENCOUNTER — Telehealth: Payer: Self-pay | Admitting: *Deleted

## 2017-03-06 ENCOUNTER — Encounter: Payer: Self-pay | Admitting: Internal Medicine

## 2017-03-06 ENCOUNTER — Encounter: Payer: Self-pay | Admitting: *Deleted

## 2017-03-06 VITALS — BP 119/81 | HR 100

## 2017-03-06 VITALS — BP 127/80 | HR 114 | Temp 97.7°F | Resp 18 | Ht 64.0 in | Wt 165.1 lb

## 2017-03-06 DIAGNOSIS — E860 Dehydration: Secondary | ICD-10-CM

## 2017-03-06 DIAGNOSIS — R112 Nausea with vomiting, unspecified: Secondary | ICD-10-CM

## 2017-03-06 DIAGNOSIS — D61810 Antineoplastic chemotherapy induced pancytopenia: Secondary | ICD-10-CM

## 2017-03-06 DIAGNOSIS — C3432 Malignant neoplasm of lower lobe, left bronchus or lung: Secondary | ICD-10-CM

## 2017-03-06 DIAGNOSIS — J449 Chronic obstructive pulmonary disease, unspecified: Secondary | ICD-10-CM | POA: Diagnosis not present

## 2017-03-06 DIAGNOSIS — T451X5A Adverse effect of antineoplastic and immunosuppressive drugs, initial encounter: Secondary | ICD-10-CM

## 2017-03-06 DIAGNOSIS — D6481 Anemia due to antineoplastic chemotherapy: Secondary | ICD-10-CM | POA: Diagnosis not present

## 2017-03-06 DIAGNOSIS — Z5111 Encounter for antineoplastic chemotherapy: Secondary | ICD-10-CM

## 2017-03-06 DIAGNOSIS — C3492 Malignant neoplasm of unspecified part of left bronchus or lung: Secondary | ICD-10-CM

## 2017-03-06 HISTORY — DX: Dehydration: E86.0

## 2017-03-06 MED ORDER — SODIUM CHLORIDE 0.9 % IV SOLN: Freq: Once | INTRAVENOUS | Status: DC

## 2017-03-06 MED ORDER — SODIUM CHLORIDE 0.9% FLUSH
10.0000 mL | INTRAVENOUS | Status: DC | PRN
  Filled 2017-03-06: qty 10

## 2017-03-06 MED ORDER — ONDANSETRON HCL 4 MG/2ML IJ SOLN
8.0000 mg | Freq: Once | INTRAMUSCULAR | Status: AC
  Administered 2017-03-06: 8 mg via INTRAVENOUS

## 2017-03-06 MED ORDER — SODIUM CHLORIDE 0.9 % IV SOLN
Freq: Once | INTRAVENOUS | Status: AC
  Administered 2017-03-06: 12:00:00 via INTRAVENOUS

## 2017-03-06 MED ORDER — HEPARIN SOD (PORK) LOCK FLUSH 100 UNIT/ML IV SOLN
500.0000 [IU] | Freq: Once | INTRAVENOUS | Status: DC | PRN
  Filled 2017-03-06: qty 5

## 2017-03-06 MED ORDER — ONDANSETRON HCL 4 MG/2ML IJ SOLN
INTRAMUSCULAR | Status: AC
  Filled 2017-03-06: qty 4

## 2017-03-06 NOTE — Progress Notes (Signed)
[place] [phone]   Fax:([phone]) Amo, MD [address] [place] [address]  DIAGNOSIS: Limited stage (T1b, N2, M0) small cell lung cancer presented with left lower lobe/infrahilar mass and large mediastinal lymphadenopathy diagnosed in October 2017.  PRIOR THERAPY: 1) Systemic chemotherapy with cisplatin 60 MG/M2 on day 1 and etoposide 120 MG/M2 on days 1, 2 and 3 status post 1 cycle. This was discontinued secondary to intolerance. 2) Systemic chemotherapy with carboplatin for AUC of 4 on day 1 and etoposide 100 MG/M2 on days 1, 2 and 3 with Neulasta support on day 4 every 3 weeks. Status post 5 cycles. This was concurrent with radiation in [place], [place]: None.  INTERVAL HISTORY: Rebekah Henderson 61 y.o. female returns to the clinic today for follow-up visit accompanied by a family member. The patient tolerated the last cycle of her treatment of chemotherapy with carboplatin and etoposide well except for fatigue as well as chemotherapy-induced pancytopenia. She required PRBCs transfusion last week. The patient also has been complaining of increasing fatigue and weakness as well as intermittent nausea. She has Compazine and Zofran at home but sometimes not controlling her nausea. She is a little bit tired today and has lack of by mouth intake and dehydrated. She denied having any fever or chills. She denied having any chest pain, shortness of breath, cough or hemoptysis. She had repeat CT scan of the chest performed recently and she is here for evaluation and discussion of her scan results and treatment options.  MEDICAL HISTORY: Past Medical History:  Diagnosis Date  . 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 Anxiety    takes Prozac daily  . Arthritis   . Arthritis   . Colon polyps   . COPD (chronic obstructive pulmonary disease) ([place])   . Diverticulitis   .  Dyspnea    with exertion  . Encounter for antineoplastic chemotherapy 12/03/2016  . Fibromyalgia   . GERD (gastroesophageal reflux disease)    takes Pantoprazole daily  . Hypertension    takes Metoprolol,Triamterene-HCTZ,and Amlodipine daily  . Hypothyroidism    takes Synthroid daily  . IBS (irritable bowel syndrome)   . lung ca dx'd 10/02/2016   skin, lung  . PONV (postoperative nausea and vomiting)    pt also states that she had some difficulty breathing after cervical fusion    ALLERGIES:  is allergic to penicillins; codeine; ranitidine hcl; keflex [cephalexin]; and lyrica [pregabalin].  MEDICATIONS:  Current Outpatient Prescriptions  Medication Sig Dispense Refill  . albuterol (PROVENTIL HFA;VENTOLIN HFA) 108 (90 Base) MCG/ACT inhaler Inhale 2 puffs into the lungs every 4 (four) hours as needed for wheezing or shortness of breath.    Marland Kitchen albuterol (PROVENTIL) 2 MG tablet Take 2 mg by mouth daily as needed for wheezing or shortness of breath.     Marland Kitchen amLODipine (NORVASC) 5 MG tablet Take 5 mg by mouth daily.      Marland Kitchen dicyclomine (BENTYL) 20 MG tablet Take 20 mg by mouth 3 (three) times daily as needed for spasms.    Marland Kitchen estazolam (PROSOM) 2 MG tablet Take 2 mg by mouth at bedtime.    Marland Kitchen estradiol (ESTRACE) 2 MG tablet Take 2 mg by mouth daily.      Marland Kitchen FLUoxetine (PROZAC) 40 MG capsule Take 40 mg by mouth daily.   2  . HYDROcodone-acetaminophen (NORCO/VICODIN) 5-325 MG tablet Take 1 tablet by mouth  every 4 (four) hours as needed for moderate pain.   0  . levothyroxine (SYNTHROID, LEVOTHROID) 25 MCG tablet Take 25 mcg by mouth daily before breakfast.     . loperamide (IMODIUM) 2 MG capsule Take 2 mg by mouth every 2 (two) hours as needed for diarrhea or loose stools.    . magnesium oxide (MAG-OX) 400 (241.3 Mg) MG tablet Take 2 tablets (800 mg total) by mouth 2 (two) times daily. 120 tablet 3  . metoprolol succinate (TOPROL-XL) 25 MG 24 hr tablet Take 25 mg by mouth daily.    . Multiple  Vitamin (MULTIVITAMIN WITH MINERALS) TABS tablet Take 2 tablets by mouth daily.    . ondansetron (ZOFRAN) 8 MG tablet TAKE (1) TABLET BY MOUTH THREE TIMES DAILY AS NEEDED. 30 tablet 0  . oxyCODONE-acetaminophen (PERCOCET) 5-325 MG tablet Take 1-2 tablets by mouth every 4 (four) hours as needed. 30 tablet 0  . pantoprazole (PROTONIX) 40 MG tablet Take 40 mg by mouth 2 (two) times daily before a meal.   10  . potassium chloride SA (K-DUR,KLOR-CON) 20 MEQ tablet Take 2 tablets (40 mEq total) by mouth daily. 30 tablet 2  . triamterene-hydrochlorothiazide (MAXZIDE-25) 37.5-25 MG tablet Take 1 tablet by mouth daily.     No current facility-administered medications for this visit.     SURGICAL HISTORY:  Past Surgical History:  Procedure Laterality Date  . BIOPSY N/A 05/25/2013   Procedure: BIOPSIES (Random Colon; Duodenal; Gastric);  Surgeon: Danie Binder, MD;  Location: [facility];  Service: Endoscopy;  Laterality: N/A;  . BLADDER SUSPENSION    . BREAST ENHANCEMENT SURGERY    . BREAST IMPLANT REMOVAL    . CERVICAL FUSION  AUG 2013  . CHOLECYSTECTOMY  1999  . COLONOSCOPY  2007 [place]   POLYPS  . COLONOSCOPY WITH PROPOFOL N/A 05/25/2013   Procedure: COLONOSCOPY WITH PROPOFOL(at cecum 0957) total withdrawal time=15mn);  Surgeon: SDanie Binder MD;  Location: [facility];  Service: Endoscopy;  Laterality: N/A;  . ESOPHAGOGASTRODUODENOSCOPY (EGD) WITH PROPOFOL N/A 05/25/2013   Procedure: ESOPHAGOGASTRODUODENOSCOPY (EGD) WITH PROPOFOL;  Surgeon: SDanie Binder MD;  Location: [facility];  Service: Endoscopy;  Laterality: N/A;  . FOOT SURGERY    . POLYPECTOMY N/A 05/25/2013   Procedure: POLYPECTOMY (Rectal and Gastric);  Surgeon: SDanie Binder MD;  Location: [facility];  Service: Endoscopy;  Laterality: N/A;  . TONSILLECTOMY    . UPPER GASTROINTESTINAL ENDOSCOPY    . VIDEO BRONCHOSCOPY WITH ENDOBRONCHIAL ULTRASOUND  09/12/2016   Procedure: VIDEO BRONCHOSCOPY WITH ENDOBRONCHIAL ULTRASOUND AND BIOPSY;  Surgeon:  DJuanito Doom MD;  Location: [facility];  Service: Cardiopulmonary;;    REVIEW OF SYSTEMS:  Constitutional: positive for anorexia, fatigue and weight loss Eyes: negative Ears, nose, mouth, throat, and face: negative Respiratory: negative Cardiovascular: negative Gastrointestinal: positive for nausea and vomiting Genitourinary:negative Integument/breast: negative Hematologic/lymphatic: negative Musculoskeletal:positive for arthralgias and muscle weakness Neurological: negative Behavioral/Psych: negative Endocrine: negative Allergic/Immunologic: negative   PHYSICAL EXAMINATION: General appearance: alert, cooperative, fatigued and no distress Head: Normocephalic, without obvious abnormality, atraumatic Neck: no adenopathy, no JVD, supple, symmetrical, trachea midline and thyroid not enlarged, symmetric, no tenderness/mass/nodules Lymph nodes: Cervical, supraclavicular, and axillary nodes normal. Resp: clear to auscultation bilaterally Back: symmetric, no curvature. ROM normal. No CVA tenderness. Cardio: regular rate and rhythm, S1, S2 normal, no murmur, click, rub or gallop GI: soft, non-tender; bowel sounds normal; no masses,  no organomegaly Extremities: extremities normal, atraumatic, no cyanosis or edema Neurologic: Alert and oriented X  3, normal strength and tone. Normal symmetric reflexes. Normal coordination and gait   ECOG PERFORMANCE STATUS: 1 - Symptomatic but completely ambulatory  Blood pressure 127/80, pulse (!) 114, temperature 97.7 F (36.5 C), temperature source Oral, resp. rate 18, height '5\' 4"'$  (1.626 m), weight 165 lb 1.6 oz (74.9 kg), SpO2 92 %.  LABORATORY DATA: Lab Results  Component Value Date   WBC 5.4 03/03/2017   HGB 8.2 (L) 03/03/2017   HCT 24.5 (L) 03/03/2017   MCV 87.2 03/03/2017   PLT 231 03/03/2017      Chemistry      Component Value Date/Time   NA 136 03/03/2017 0726   NA 139 02/03/2017 1130   K 3.0 (L) 03/03/2017 0726   K 4.2 02/03/2017  1130   CL 103 03/03/2017 0726   CO2 26 03/03/2017 0726   CO2 25 02/03/2017 1130   BUN 6 03/03/2017 0726   BUN 11.2 02/03/2017 1130   CREATININE 0.58 03/03/2017 0726   CREATININE 0.7 02/03/2017 1130      Component Value Date/Time   CALCIUM 7.8 (L) 03/03/2017 0726   CALCIUM 9.7 02/03/2017 1130   ALKPHOS 38 03/03/2017 0726   ALKPHOS 54 02/03/2017 1130   AST 12 (L) 03/03/2017 0726   AST 18 02/03/2017 1130   ALT 6 (L) 03/03/2017 0726   ALT 9 02/03/2017 1130   BILITOT 0.8 03/03/2017 0726   BILITOT 0.73 02/03/2017 1130       RADIOGRAPHIC STUDIES: Ct Chest W Contrast  Result Date: 03/03/2017 CLINICAL DATA:  Followup lung carcinoma. Ongoing chemotherapy. Recently completed radiation therapy. Left-sided abdominal and back pain, nausea and vomiting, diarrhea for 3 days. EXAM: CT CHEST, ABDOMEN, AND PELVIS WITH CONTRAST TECHNIQUE: Multidetector CT imaging of the chest, abdomen and pelvis was performed following the standard protocol during bolus administration of intravenous contrast. CONTRAST:  135m ISOVUE-300 IOPAMIDOL (ISOVUE-300) INJECTION 61% COMPARISON:  Chest CT on 01/06/2017 and PET-CT 09/27/2016 FINDINGS: CT CHEST FINDINGS Cardiovascular: No acute findings. Mediastinum/Lymph Nodes: Mediastinal lymphadenopathy in the [facility] window shows no significant change, measuring 3.3 x 3.2 cm on image 20/3. No new masses or lymphadenopathy identified. New airspace disease is seen within the left upper and lower lobes in a predominant paramediastinal distribution which is suspicious for radiation pneumonitis, although pneumonia cannot be excluded. No discrete pulmonary nodules or masses are identified. Mild right lower lobe scarring is stable. Right lung is otherwise clear. No evidence of pleural effusion. Lungs/Pleura: No pulmonary infiltrate or mass identified. No effusion present. Musculoskeletal:  No suspicious bone lesions identified. CT ABDOMEN AND PELVIS FINDINGS Hepatobiliary: No masses identified.  Mild hepatic steatosis. Prior cholecystectomy noted. No evidence of biliary dilatation. Pancreas:  No mass or inflammatory changes. Spleen:  Within normal limits in size and appearance. Adrenals/Urinary tract: Tiny low-attenuation lesion in upper pole of right kidney is too small to characterize. No definite masses or hydronephrosis. Stomach/Bowel: No evidence of obstruction, inflammatory process, or abnormal fluid collections. Colonic diverticulosis again noted, without evidence of diverticulitis. Vascular/Lymphatic: No pathologically enlarged lymph nodes identified. No abdominal aortic aneurysm. Aortic atherosclerosis. Reproductive: Prior hysterectomy noted. Adnexal regions are unremarkable in appearance. Other:  None. Musculoskeletal:  No suspicious bone lesions identified. IMPRESSION: Stable mediastinal lymphadenopathy in the [facility] window. No new or progressive metastatic disease identified. New left upper and lower lobe airspace disease and prominent paramediastinal distribution, highly suspicious for radiation pneumonitis although pneumonia cannot be excluded. No acute findings within the abdomen or pelvis. Electronically Signed   By: JSharrie RothmanD.  On: 03/03/2017 09:32   Ct Abdomen Pelvis W Contrast  Result Date: 03/03/2017 CLINICAL DATA:  Followup lung carcinoma. Ongoing chemotherapy. Recently completed radiation therapy. Left-sided abdominal and back pain, nausea and vomiting, diarrhea for 3 days. EXAM: CT CHEST, ABDOMEN, AND PELVIS WITH CONTRAST TECHNIQUE: Multidetector CT imaging of the chest, abdomen and pelvis was performed following the standard protocol during bolus administration of intravenous contrast. CONTRAST:  121m ISOVUE-300 IOPAMIDOL (ISOVUE-300) INJECTION 61% COMPARISON:  Chest CT on 01/06/2017 and PET-CT 09/27/2016 FINDINGS: CT CHEST FINDINGS Cardiovascular: No acute findings. Mediastinum/Lymph Nodes: Mediastinal lymphadenopathy in the [facility] window shows no significant change, measuring  3.3 x 3.2 cm on image 20/3. No new masses or lymphadenopathy identified. New airspace disease is seen within the left upper and lower lobes in a predominant paramediastinal distribution which is suspicious for radiation pneumonitis, although pneumonia cannot be excluded. No discrete pulmonary nodules or masses are identified. Mild right lower lobe scarring is stable. Right lung is otherwise clear. No evidence of pleural effusion. Lungs/Pleura: No pulmonary infiltrate or mass identified. No effusion present. Musculoskeletal:  No suspicious bone lesions identified. CT ABDOMEN AND PELVIS FINDINGS Hepatobiliary: No masses identified. Mild hepatic steatosis. Prior cholecystectomy noted. No evidence of biliary dilatation. Pancreas:  No mass or inflammatory changes. Spleen:  Within normal limits in size and appearance. Adrenals/Urinary tract: Tiny low-attenuation lesion in upper pole of right kidney is too small to characterize. No definite masses or hydronephrosis. Stomach/Bowel: No evidence of obstruction, inflammatory process, or abnormal fluid collections. Colonic diverticulosis again noted, without evidence of diverticulitis. Vascular/Lymphatic: No pathologically enlarged lymph nodes identified. No abdominal aortic aneurysm. Aortic atherosclerosis. Reproductive: Prior hysterectomy noted. Adnexal regions are unremarkable in appearance. Other:  None. Musculoskeletal:  No suspicious bone lesions identified. IMPRESSION: Stable mediastinal lymphadenopathy in the [facility] window. No new or progressive metastatic disease identified. New left upper and lower lobe airspace disease and prominent paramediastinal distribution, highly suspicious for radiation pneumonitis although pneumonia cannot be excluded. No acute findings within the abdomen or pelvis. Electronically Signed   By: JEarle GellM.D.   On: 03/03/2017 09:32    ASSESSMENT AND PLAN:  This is a very pleasant 61years old white female with limited stage small cell lung  cancer status post total of 6 cycles of systemic chemotherapy mainly with carboplatin and etoposide. She had a rough time with this treatment was increasing fatigue and weakness as well as chemotherapy-induced pancytopenia and requirement for PRBCs and platelet transfusion. The patient also has nausea and vomiting. She had a recent CT scan of the chest on 03/03/2017. I personally and independently reviewed the scan images and discuss the results with the patient and her friend and showed them the images. Her scan showed no evidence for disease progression but there is still residual stable mediastinal lymph node. I recommended for the patient to continue on observation for now with repeat CT scan of the chest in 2 months for restaging of her disease. I also discussed with the patient the role of prophylactic cranial irradiation and [facility] I personally called the radiation oncologist to arrange for her treatment. For the nausea and vomiting as well as dehydration, I will arrange for the patient to receive 1 L of normal saline with 8 mg of Zofran IV today. She was also encouraged to increase her oral intake and to take her nausea medicine as needed. For the chemotherapy-induced anemia, her hemoglobin and hematocrit are better  and we'll continue to monitor for now. She was advised to call immediately if she has any concerning symptoms in the interval. The patient voices understanding of current disease status and treatment options and is in agreement with the current care plan. All questions were answered. The patient knows to call the clinic with any problems, questions or concerns. We can certainly see the patient much sooner if necessary.  Disclaimer: This note was dictated with voice recognition software. Similar sounding words can inadvertently be transcribed and may not be corrected upon review.

## 2017-03-06 NOTE — Progress Notes (Signed)
Oncology Nurse Navigator Documentation  Oncology Nurse Navigator Flowsheets 03/06/2017  Navigator Location [facility]  Navigator Encounter Type Other/Dr. Julien Nordmann requested I contact [facility] for patient to get her PCI treatment.  I complete referral info, faxed to [facility], and called.  I spoke to [place] regarding Ms. Santo and Dr. Worthy Flank request for PCI.    Treatment Phase Treatment  Barriers/Navigation Needs Coordination of Care  Interventions Coordination of Care  Coordination of Care Other  Acuity Level 2  Acuity Level 2 Assistance expediting appointments  Time Spent with Patient 30

## 2017-03-06 NOTE — Progress Notes (Signed)
RN visit for IV fluids. 

## 2017-03-06 NOTE — Patient Instructions (Signed)
Nausea, Adult Nausea is the feeling of an upset stomach or having to vomit. Nausea on its own is not usually a serious concern, but it may be an early sign of a more serious medical problem. As nausea gets worse, it can lead to vomiting. If vomiting develops, or if you are not able to drink enough fluids, you are at risk of becoming dehydrated. Dehydration can make you tired and thirsty, cause you to have a dry mouth, and decrease how often you urinate. Older adults and people with other diseases or a weak immune system are at higher risk for dehydration. The main goals of treating your nausea are:  To limit repeated nausea episodes.  To prevent vomiting and dehydration. Follow these instructions at home: Follow instructions from your health care provider about how to care for yourself at home. Eating and drinking  Follow these recommendations as told by your health care provider:  Take an oral rehydration solution (ORS). This is a drink that is sold at pharmacies and retail stores.  Drink clear fluids in small amounts as you are able. Clear fluids include water, ice chips, diluted fruit juice, and low-calorie sports drinks.  Eat bland, easy-to-digest foods in small amounts as you are able. These foods include bananas, applesauce, rice, lean meats, toast, and crackers.  Avoid drinking fluids that contain a lot of sugar or caffeine, such as energy drinks, sports drinks, and soda.  Avoid alcohol.  Avoid spicy or fatty foods. General instructions   Drink enough fluid to keep your urine clear or pale yellow.  Wash your hands often. If soap and water are not available, use hand sanitizer.  Make sure that all people in your household wash their hands well and often.  Rest at home while you recover.  Take over-the-counter and prescription medicines only as told by your health care provider.  Breathe slowly and deeply when you feel nauseous.  Watch your condition for any changes.  Keep  all follow-up visits as told by your health care provider. This is important. Contact a health care provider if:  You have a headache.  You have new symptoms.  Your nausea gets worse.  You have a fever.  You feel light-headed or dizzy.  You vomit.  You cannot keep fluids down. Get help right away if:  You have pain in your chest, neck, arm, or jaw.  You feel extremely weak or you faint.  You have vomit that is bright red or looks like coffee grounds.  You have bloody or black stools or stools that look like tar.  You have a severe headache, a stiff neck, or both.  You have severe pain, cramping, or bloating in your abdomen.  You have a rash.  You have difficulty breathing or are breathing very quickly.  Your heart is beating very quickly.  Your skin feels cold and clammy.  You feel confused.  You have pain when you urinate.  You have signs of dehydration, such as:  Dark urine, very little, or no urine.  Cracked lips.  Dry mouth.  Sunken eyes.  Sleepiness.  Weakness. These symptoms may represent a serious problem that is an emergency. Do not wait to see if the symptoms will go away. Get medical help right away. Call your local emergency services (911 in the [place] Do not drive yourself to the hospital. This information is not intended to replace advice given to you by your health care provider. Make sure you discuss any questions you have  with your health care provider. Document Released: 12/26/2004 Document Revised: 04/22/2016 Document Reviewed: 07/25/2015 Elsevier Interactive Patient Education  2017 [employer]    Dehydration, Adult Dehydration is a condition in which there is not enough fluid or water in the body. This happens when you lose more fluids than you take in. Important organs, such as the kidneys, brain, and heart, cannot function without a proper amount of fluids. Any loss of fluids from the body can lead to dehydration. Dehydration can  range from mild to severe. This condition should be treated right away to prevent it from becoming severe. What are the causes? This condition may be caused by:  Vomiting.  Diarrhea.  Excessive sweating, such as from heat exposure or exercise.  Not drinking enough fluid, especially:  When ill.  While doing activity that requires a lot of energy.  Excessive urination.  Fever.  Infection.  Certain medicines, such as medicines that cause the body to lose excess fluid (diuretics).  Inability to access safe drinking water.  Reduced physical ability to get adequate water and food. What increases the risk? This condition is more likely to develop in people:  Who have a poorly controlled long-term (chronic) illness, such as diabetes, heart disease, or kidney disease.  Who are age 22 or older.  Who are disabled.  Who live in a place with high altitude.  Who play endurance sports. What are the signs or symptoms? Symptoms of mild dehydration may include:   Thirst.  Dry lips.  Slightly dry mouth.  Dry, warm skin.  Dizziness. Symptoms of moderate dehydration may include:   Very dry mouth.  Muscle cramps.  Dark urine. Urine may be the color of tea.  Decreased urine production.  Decreased tear production.  Heartbeat that is irregular or faster than normal (palpitations).  Headache.  Light-headedness, especially when you stand up from a sitting position.  Fainting (syncope). Symptoms of severe dehydration may include:   Changes in skin, such as:  Cold and clammy skin.  Blotchy (mottled) or pale skin.  Skin that does not quickly return to normal after being lightly pinched and released (poor skin turgor).  Changes in body fluids, such as:  Extreme thirst.  No tear production.  Inability to sweat when body temperature is high, such as in hot weather.  Very little urine production.  Changes in vital signs, such as:  Weak pulse.  Pulse that is  more than 100 beats a minute when sitting still.  Rapid breathing.  Low blood pressure.  Other changes, such as:  Sunken eyes.  Cold hands and feet.  Confusion.  Lack of energy (lethargy).  Difficulty waking up from sleep.  Short-term weight loss.  Unconsciousness. How is this diagnosed? This condition is diagnosed based on your symptoms and a physical exam. Blood and urine tests may be done to help confirm the diagnosis. How is this treated? Treatment for this condition depends on the severity. Mild or moderate dehydration can often be treated at home. Treatment should be started right away. Do not wait until dehydration becomes severe. Severe dehydration is an emergency and it needs to be treated in a hospital. Treatment for mild dehydration may include:   Drinking more fluids.  Replacing salts and minerals in your blood (electrolytes) that you may have lost. Treatment for moderate dehydration may include:   Drinking an oral rehydration solution (ORS). This is a drink that helps you replace fluids and electrolytes (rehydrate). It can be found at pharmacies and retail  stores. Treatment for severe dehydration may include:   Receiving fluids through an IV tube.  Receiving an electrolyte solution through a feeding tube that is passed through your nose and into your stomach (nasogastric tube, or NG tube).  Correcting any abnormalities in electrolytes.  Treating the underlying cause of dehydration. Follow these instructions at home:  If directed by your health care provider, drink an ORS:  Make an ORS by following instructions on the package.  Start by drinking small amounts, about  cup (120 mL) every 5-10 minutes.  Slowly increase how much you drink until you have taken the amount recommended by your health care provider.  Drink enough clear fluid to keep your urine clear or pale yellow. If you were told to drink an ORS, finish the ORS first, then start slowly  drinking other clear fluids. Drink fluids such as:  Water. Do not drink only water. Doing that can lead to having too little salt (sodium) in the body (hyponatremia).  Ice chips.  Fruit juice that you have added water to (diluted fruit juice).  Low-calorie sports drinks.  Avoid:  Alcohol.  Drinks that contain a lot of sugar. These include high-calorie sports drinks, fruit juice that is not diluted, and soda.  Caffeine.  Foods that are greasy or contain a lot of fat or sugar.  Take over-the-counter and prescription medicines only as told by your health care provider.  Do not take sodium tablets. This can lead to having too much sodium in the body (hypernatremia).  Eat foods that contain a healthy balance of electrolytes, such as bananas, oranges, potatoes, tomatoes, and spinach.  Keep all follow-up visits as told by your health care provider. This is important. Contact a health care provider if:  You have abdominal pain that:  Gets worse.  Stays in one area (localizes).  You have a rash.  You have a stiff neck.  You are more irritable than usual.  You are sleepier or more difficult to wake up than usual.  You feel weak or dizzy.  You feel very thirsty.  You have urinated only a small amount of very dark urine over 6-8 hours. Get help right away if:  You have symptoms of severe dehydration.  You cannot drink fluids without vomiting.  Your symptoms get worse with treatment.  You have a fever.  You have a severe headache.  You have vomiting or diarrhea that:  Gets worse.  Does not go away.  You have blood or green matter (bile) in your vomit.  You have blood in your stool. This may cause stool to look black and tarry.  You have not urinated in 6-8 hours.  You faint.  Your heart rate while sitting still is over 100 beats a minute.  You have trouble breathing. This information is not intended to replace advice given to you by your health care  provider. Make sure you discuss any questions you have with your health care provider. Document Released: 11/18/2005 Document Revised: 06/14/2016 Document Reviewed: 01/12/2016 Elsevier Interactive Patient Education  2017 [employer].

## 2017-03-08 ENCOUNTER — Telehealth: Payer: Self-pay | Admitting: Internal Medicine

## 2017-03-08 NOTE — Telephone Encounter (Signed)
Scheduled appts per 4/5 los. - Will call for referral to rad onc in [place], [place]. For PCI , Monday morning - offices closed 4/7. Appt letter sent to patient with f/u appt date and time.

## 2017-03-10 ENCOUNTER — Telehealth: Payer: Self-pay | Admitting: *Deleted

## 2017-03-10 ENCOUNTER — Other Ambulatory Visit: Payer: Self-pay | Admitting: Internal Medicine

## 2017-03-10 DIAGNOSIS — C3492 Malignant neoplasm of unspecified part of left bronchus or lung: Secondary | ICD-10-CM

## 2017-03-10 MED ORDER — INTEGRA PLUS PO CAPS
1.0000 | ORAL_CAPSULE | Freq: Every morning | ORAL | 1 refills | Status: DC
Start: 2017-03-10 — End: 2017-05-16

## 2017-03-10 NOTE — Telephone Encounter (Signed)
Oncology Nurse Navigator Documentation  Oncology Nurse Navigator Flowsheets 03/10/2017  Navigator Location [facility]  Navigator Encounter Type Telephone/I called [facility] to check on Ms. Dambrosia's PCI appt.  I was told Ms. Ikard cancelled appt due to not feeling well.  I called Ms. Cassel and she explained to me she was not feeling well. I listened as she explained. I updated Dr. Julien Nordmann. He ordered labs and iron supplements for her. I called Ms. Bayley back and updated her.  Ms. Buskey also had questions about her PCI.  I explained.  She was thankful for the update and help.  I then called Rad On[facility] in [place] and updated [place].  She states she will call her Monday of next week to schedule again.    Telephone Outgoing Call  Treatment Phase Follow-up  Barriers/Navigation Needs Coordination of Care;Education  Education Other  Interventions Coordination of Care;Education  Coordination of Care Appts  Education Method Verbal  Acuity Level 2  Time Spent with Patient 85

## 2017-03-11 ENCOUNTER — Other Ambulatory Visit (HOSPITAL_COMMUNITY)
Admission: RE | Admit: 2017-03-11 | Discharge: 2017-03-11 | Disposition: A | Discharge status: Home / Self Care | Payer: Medicare Other | Source: Ambulatory Visit | Attending: Internal Medicine | Admitting: Internal Medicine

## 2017-03-11 DIAGNOSIS — C3490 Malignant neoplasm of unspecified part of unspecified bronchus or lung: Secondary | ICD-10-CM | POA: Diagnosis present

## 2017-03-11 LAB — CBC WITH DIFFERENTIAL/PLATELET
BASOS ABS: 0 10*3/uL (ref 0.0–0.1)
Basophils Relative: 0 %
EOS PCT: 6 %
Eosinophils Absolute: 0.5 10*3/uL (ref 0.0–0.7)
HCT: 33.8 % — ABNORMAL LOW (ref 36.0–46.0)
Hemoglobin: 11.3 g/dL — ABNORMAL LOW (ref 12.0–15.0)
LYMPHS ABS: 1.4 10*3/uL (ref 0.7–4.0)
LYMPHS PCT: 15 %
MCH: 29.4 pg (ref 26.0–34.0)
MCHC: 33.4 g/dL (ref 30.0–36.0)
MCV: 88 fL (ref 78.0–100.0)
MONO ABS: 0.8 10*3/uL (ref 0.1–1.0)
Monocytes Relative: 8 %
Neutro Abs: 6.6 10*3/uL (ref 1.7–7.7)
Neutrophils Relative %: 71 %
PLATELETS: 334 10*3/uL (ref 150–400)
RBC: 3.84 MIL/uL — ABNORMAL LOW (ref 3.87–5.11)
RDW: 16.6 % — AB (ref 11.5–15.5)
WBC: 9.3 10*3/uL (ref 4.0–10.5)

## 2017-03-11 LAB — COMPREHENSIVE METABOLIC PANEL
ALT: 6 U/L — ABNORMAL LOW (ref 14–54)
ANION GAP: 13 (ref 5–15)
AST: 13 U/L — ABNORMAL LOW (ref 15–41)
Albumin: 3.3 g/dL — ABNORMAL LOW (ref 3.5–5.0)
Alkaline Phosphatase: 54 U/L (ref 38–126)
BUN: 10 mg/dL (ref 6–20)
CHLORIDE: 96 mmol/L — AB (ref 101–111)
CO2: 27 mmol/L (ref 22–32)
Calcium: 9.3 mg/dL (ref 8.9–10.3)
Creatinine, Ser: 0.76 mg/dL (ref 0.44–1.00)
Glucose, Bld: 100 mg/dL — ABNORMAL HIGH (ref 65–99)
POTASSIUM: 3 mmol/L — AB (ref 3.5–5.1)
Sodium: 136 mmol/L (ref 135–145)
Total Bilirubin: 0.7 mg/dL (ref 0.3–1.2)
Total Protein: 7.5 g/dL (ref 6.5–8.1)

## 2017-03-11 LAB — SAMPLE TO BLOOD BANK

## 2017-03-12 ENCOUNTER — Other Ambulatory Visit: Payer: Self-pay | Admitting: Internal Medicine

## 2017-03-12 ENCOUNTER — Telehealth: Payer: Self-pay | Admitting: Medical Oncology

## 2017-03-12 ENCOUNTER — Other Ambulatory Visit: Payer: Self-pay | Admitting: Medical Oncology

## 2017-03-12 ENCOUNTER — Encounter (HOSPITAL_COMMUNITY)
Admission: RE | Admit: 2017-03-12 | Discharge: 2017-03-12 | Disposition: A | Discharge status: Home / Self Care | Payer: Medicare Other | Source: Ambulatory Visit | Attending: Internal Medicine | Admitting: Internal Medicine

## 2017-03-12 DIAGNOSIS — E860 Dehydration: Secondary | ICD-10-CM

## 2017-03-12 DIAGNOSIS — E876 Hypokalemia: Secondary | ICD-10-CM

## 2017-03-12 DIAGNOSIS — R112 Nausea with vomiting, unspecified: Secondary | ICD-10-CM | POA: Diagnosis not present

## 2017-03-12 DIAGNOSIS — C3492 Malignant neoplasm of unspecified part of left bronchus or lung: Secondary | ICD-10-CM | POA: Diagnosis present

## 2017-03-12 MED ORDER — SODIUM CHLORIDE 0.9 % IV SOLN
INTRAVENOUS | Status: DC
  Administered 2017-03-12: 13:00:00 via INTRAVENOUS
  Filled 2017-03-12: qty 1000

## 2017-03-12 MED ORDER — SODIUM CHLORIDE 0.9 % IV SOLN
8.0000 mg | Freq: Once | INTRAVENOUS | Status: AC
  Administered 2017-03-12: 8 mg via INTRAVENOUS
  Filled 2017-03-12: qty 4

## 2017-03-12 MED ORDER — POTASSIUM CHLORIDE CRYS ER 20 MEQ PO TBCR: 40.0000 meq | EXTENDED_RELEASE_TABLET | Freq: Every day | ORAL | 1 refills | Status: DC

## 2017-03-12 NOTE — Telephone Encounter (Signed)
PEr Rebekah Henderson IVF/K+ set up at [facility] today at 1230 -pt aware.

## 2017-03-12 NOTE — Telephone Encounter (Signed)
Returned call -persistent vomiting and diarrhea since last week. Last zofran and imodium at 2 am. She is out of kdur ( last dose friday) . " I am so weak I cannot drive"

## 2017-03-13 ENCOUNTER — Telehealth: Payer: Self-pay | Admitting: Medical Oncology

## 2017-03-13 NOTE — Telephone Encounter (Signed)
"   I am not doing a lot better". Symptoms nausea ,vomiting -mostly bile,  sob when she walks across floor and dizzy , watery diarrhea. She ate sandwich last night and vomited it right back up. She is  taking mag oxide 2 tabs daily ( Abrol). Per  Julien Nordmann she may need to stop magnesium and go to ED for evaluation-pt notified.

## 2017-03-17 ENCOUNTER — Other Ambulatory Visit: Payer: Self-pay | Admitting: Internal Medicine

## 2017-03-17 ENCOUNTER — Other Ambulatory Visit: Payer: Self-pay | Admitting: Medical Oncology

## 2017-03-17 ENCOUNTER — Telehealth: Payer: Self-pay | Admitting: Medical Oncology

## 2017-03-17 ENCOUNTER — Telehealth: Payer: Self-pay

## 2017-03-17 DIAGNOSIS — R112 Nausea with vomiting, unspecified: Secondary | ICD-10-CM

## 2017-03-17 DIAGNOSIS — C3492 Malignant neoplasm of unspecified part of left bronchus or lung: Secondary | ICD-10-CM

## 2017-03-17 MED ORDER — ONDANSETRON 4 MG PO TBDP: 4.0000 mg | ORAL_TABLET | Freq: Three times a day (TID) | ORAL | 0 refills | Status: DC | PRN

## 2017-03-17 MED ORDER — OLANZAPINE 10 MG PO TABS: 10.0000 mg | ORAL_TABLET | Freq: Every evening | ORAL | 0 refills | Status: DC | PRN

## 2017-03-17 NOTE — Telephone Encounter (Signed)
Called to pt and to her pharmacy.

## 2017-03-17 NOTE — Telephone Encounter (Signed)
I told pt to contact PCP to make GI referral and zofran ODT sent it.

## 2017-03-17 NOTE — Telephone Encounter (Signed)
Pt is continuing with nausea that comes and goes. She throws up about every day. She is requesting to try ODT form of zofran. She has used compazine, phenergan and zofran with same partial response.

## 2017-03-17 NOTE — Telephone Encounter (Signed)
-----   Message from Curt Bears, MD sent at 03/17/2017  3:28 PM EDT ----- Regarding: RE: nausea vomiting-wants zofran odt We can try Zyprexa before making the referral. She also should have repeat MRI of the brain. ----- Message ----- From: Ardeen Garland, RN Sent: 03/17/2017  12:55 PM To: Curt Bears, MD Subject: nausea vomiting-wants zofran odt               Pt is continuing with nausea that comes and goes. She throws up about every day. She is requesting to try ODT form of zofran. She has used compazine, phenergan and zofran with same partial response.   ? GI referral or back to PCP

## 2017-03-20 ENCOUNTER — Inpatient Hospital Stay (HOSPITAL_COMMUNITY)
Admission: EM | Admit: 2017-03-20 | Discharge: 2017-03-22 | DRG: 193 | Disposition: A | Discharge status: Home / Self Care | Payer: Medicare Other | Attending: Family Medicine | Admitting: Family Medicine

## 2017-03-20 ENCOUNTER — Telehealth: Payer: Self-pay | Admitting: *Deleted

## 2017-03-20 ENCOUNTER — Emergency Department (HOSPITAL_COMMUNITY): Payer: Medicare Other

## 2017-03-20 ENCOUNTER — Encounter (HOSPITAL_COMMUNITY): Payer: Self-pay | Admitting: Emergency Medicine

## 2017-03-20 DIAGNOSIS — K219 Gastro-esophageal reflux disease without esophagitis: Secondary | ICD-10-CM | POA: Diagnosis not present

## 2017-03-20 DIAGNOSIS — R110 Nausea: Secondary | ICD-10-CM | POA: Diagnosis not present

## 2017-03-20 DIAGNOSIS — E441 Mild protein-calorie malnutrition: Secondary | ICD-10-CM | POA: Diagnosis present

## 2017-03-20 DIAGNOSIS — Z801 Family history of malignant neoplasm of trachea, bronchus and lung: Secondary | ICD-10-CM

## 2017-03-20 DIAGNOSIS — I10 Essential (primary) hypertension: Secondary | ICD-10-CM | POA: Diagnosis not present

## 2017-03-20 DIAGNOSIS — R0602 Shortness of breath: Secondary | ICD-10-CM

## 2017-03-20 DIAGNOSIS — F419 Anxiety disorder, unspecified: Secondary | ICD-10-CM | POA: Diagnosis present

## 2017-03-20 DIAGNOSIS — Z881 Allergy status to other antibiotic agents status: Secondary | ICD-10-CM | POA: Diagnosis not present

## 2017-03-20 DIAGNOSIS — Z880 Allergy status to penicillin: Secondary | ICD-10-CM

## 2017-03-20 DIAGNOSIS — Z888 Allergy status to other drugs, medicaments and biological substances status: Secondary | ICD-10-CM | POA: Diagnosis not present

## 2017-03-20 DIAGNOSIS — Y95 Nosocomial condition: Secondary | ICD-10-CM | POA: Diagnosis present

## 2017-03-20 DIAGNOSIS — Z8249 Family history of ischemic heart disease and other diseases of the circulatory system: Secondary | ICD-10-CM

## 2017-03-20 DIAGNOSIS — D6481 Anemia due to antineoplastic chemotherapy: Secondary | ICD-10-CM | POA: Diagnosis not present

## 2017-03-20 DIAGNOSIS — J440 Chronic obstructive pulmonary disease with acute lower respiratory infection: Secondary | ICD-10-CM | POA: Diagnosis not present

## 2017-03-20 DIAGNOSIS — K589 Irritable bowel syndrome without diarrhea: Secondary | ICD-10-CM | POA: Diagnosis present

## 2017-03-20 DIAGNOSIS — M797 Fibromyalgia: Secondary | ICD-10-CM | POA: Diagnosis not present

## 2017-03-20 DIAGNOSIS — Z833 Family history of diabetes mellitus: Secondary | ICD-10-CM | POA: Diagnosis not present

## 2017-03-20 DIAGNOSIS — Z85118 Personal history of other malignant neoplasm of bronchus and lung: Secondary | ICD-10-CM

## 2017-03-20 DIAGNOSIS — Z9049 Acquired absence of other specified parts of digestive tract: Secondary | ICD-10-CM | POA: Diagnosis not present

## 2017-03-20 DIAGNOSIS — Z87891 Personal history of nicotine dependence: Secondary | ICD-10-CM | POA: Diagnosis not present

## 2017-03-20 DIAGNOSIS — Z9981 Dependence on supplemental oxygen: Secondary | ICD-10-CM

## 2017-03-20 DIAGNOSIS — Z885 Allergy status to narcotic agent status: Secondary | ICD-10-CM

## 2017-03-20 DIAGNOSIS — Z86010 Personal history of colonic polyps: Secondary | ICD-10-CM | POA: Diagnosis not present

## 2017-03-20 DIAGNOSIS — Z803 Family history of malignant neoplasm of breast: Secondary | ICD-10-CM | POA: Diagnosis not present

## 2017-03-20 DIAGNOSIS — J9621 Acute and chronic respiratory failure with hypoxia: Secondary | ICD-10-CM | POA: Diagnosis present

## 2017-03-20 DIAGNOSIS — R062 Wheezing: Secondary | ICD-10-CM | POA: Diagnosis not present

## 2017-03-20 DIAGNOSIS — J189 Pneumonia, unspecified organism: Principal | ICD-10-CM | POA: Diagnosis present

## 2017-03-20 DIAGNOSIS — T451X5A Adverse effect of antineoplastic and immunosuppressive drugs, initial encounter: Secondary | ICD-10-CM | POA: Diagnosis present

## 2017-03-20 DIAGNOSIS — E039 Hypothyroidism, unspecified: Secondary | ICD-10-CM | POA: Diagnosis present

## 2017-03-20 DIAGNOSIS — M1990 Unspecified osteoarthritis, unspecified site: Secondary | ICD-10-CM | POA: Diagnosis present

## 2017-03-20 DIAGNOSIS — C3492 Malignant neoplasm of unspecified part of left bronchus or lung: Secondary | ICD-10-CM | POA: Diagnosis present

## 2017-03-20 DIAGNOSIS — J700 Acute pulmonary manifestations due to radiation: Secondary | ICD-10-CM | POA: Diagnosis present

## 2017-03-20 DIAGNOSIS — Z923 Personal history of irradiation: Secondary | ICD-10-CM

## 2017-03-20 DIAGNOSIS — Z6827 Body mass index (BMI) 27.0-27.9, adult: Secondary | ICD-10-CM

## 2017-03-20 DIAGNOSIS — Z981 Arthrodesis status: Secondary | ICD-10-CM

## 2017-03-20 DIAGNOSIS — Y842 Radiological procedure and radiotherapy as the cause of abnormal reaction of the patient, or of later complication, without mention of misadventure at the time of the procedure: Secondary | ICD-10-CM | POA: Diagnosis present

## 2017-03-20 LAB — COMPREHENSIVE METABOLIC PANEL
ALBUMIN: 3 g/dL — AB (ref 3.5–5.0)
ALK PHOS: 43 U/L (ref 38–126)
ALT: 7 U/L — AB (ref 14–54)
ANION GAP: 9 (ref 5–15)
AST: 14 U/L — ABNORMAL LOW (ref 15–41)
BUN: 5 mg/dL — ABNORMAL LOW (ref 6–20)
CO2: 26 mmol/L (ref 22–32)
Calcium: 8.3 mg/dL — ABNORMAL LOW (ref 8.9–10.3)
Chloride: 104 mmol/L (ref 101–111)
Creatinine, Ser: 0.6 mg/dL (ref 0.44–1.00)
GFR calc Af Amer: >60 mL/min (ref >60)
GFR calc non Af Amer: >60 mL/min (ref >60)
Glucose, Bld: 89 mg/dL (ref 65–99)
Potassium: 3.9 mmol/L (ref 3.5–5.1)
Sodium: 139 mmol/L (ref 135–145)
Total Bilirubin: 0.7 mg/dL (ref 0.3–1.2)
Total Protein: 6.3 g/dL — ABNORMAL LOW (ref 6.5–8.1)

## 2017-03-20 LAB — CBC WITH DIFFERENTIAL/PLATELET
Basophils Absolute: 0 10*3/uL (ref 0.0–0.1)
Basophils Relative: 0 %
Eosinophils Absolute: 1 10*3/uL — ABNORMAL HIGH (ref 0.0–0.7)
Eosinophils Relative: 13 %
HEMATOCRIT: 29.8 % — AB (ref 36.0–46.0)
HEMOGLOBIN: 9.5 g/dL — AB (ref 12.0–15.0)
LYMPHS ABS: 1.4 10*3/uL (ref 0.7–4.0)
LYMPHS PCT: 18 %
MCH: 28.1 pg (ref 26.0–34.0)
MCHC: 31.9 g/dL (ref 30.0–36.0)
MCV: 88.2 fL (ref 78.0–100.0)
MONO ABS: 0.6 10*3/uL (ref 0.1–1.0)
MONOS PCT: 7 %
NEUTROS ABS: 4.8 10*3/uL (ref 1.7–7.7)
NEUTROS PCT: 62 %
Platelets: 281 10*3/uL (ref 150–400)
RBC: 3.38 MIL/uL — ABNORMAL LOW (ref 3.87–5.11)
RDW: 17.3 % — AB (ref 11.5–15.5)
WBC: 7.7 10*3/uL (ref 4.0–10.5)

## 2017-03-20 LAB — I-STAT CG4 LACTIC ACID, ED: LACTIC ACID, VENOUS: 1.71 mmol/L (ref 0.5–1.9)

## 2017-03-20 LAB — INFLUENZA PANEL BY PCR (TYPE A & B)
Influenza A By PCR: NEGATIVE
Influenza B By PCR: NEGATIVE

## 2017-03-20 LAB — I-STAT TROPONIN, ED: Troponin i, poc: 0 ng/mL (ref 0.00–0.08)

## 2017-03-20 MED ORDER — LEVOTHYROXINE SODIUM 25 MCG PO TABS
25.0000 ug | ORAL_TABLET | Freq: Every day | ORAL | Status: DC
  Administered 2017-03-21 – 2017-03-22 (×2): 25 ug via ORAL
  Filled 2017-03-20 (×2): qty 1

## 2017-03-20 MED ORDER — IOPAMIDOL (ISOVUE-370) INJECTION 76%
100.0000 mL | Freq: Once | INTRAVENOUS | Status: AC | PRN
  Administered 2017-03-20: 100 mL via INTRAVENOUS

## 2017-03-20 MED ORDER — DICYCLOMINE HCL 20 MG PO TABS
20.0000 mg | ORAL_TABLET | Freq: Three times a day (TID) | ORAL | Status: DC | PRN
  Filled 2017-03-20: qty 1

## 2017-03-20 MED ORDER — TEMAZEPAM 15 MG PO CAPS
30.0000 mg | ORAL_CAPSULE | Freq: Every day | ORAL | Status: DC
Start: 2017-03-20 — End: 2017-03-22
  Administered 2017-03-20 – 2017-03-21 (×2): 30 mg via ORAL
  Filled 2017-03-20 (×2): qty 2

## 2017-03-20 MED ORDER — AMLODIPINE BESYLATE 5 MG PO TABS
5.0000 mg | ORAL_TABLET | Freq: Every day | ORAL | Status: DC
Start: 2017-03-21 — End: 2017-03-22
  Administered 2017-03-21 – 2017-03-22 (×2): 5 mg via ORAL
  Filled 2017-03-20 (×2): qty 1

## 2017-03-20 MED ORDER — FLUOXETINE HCL 20 MG PO CAPS
40.0000 mg | ORAL_CAPSULE | Freq: Every day | ORAL | Status: DC
  Administered 2017-03-21 – 2017-03-22 (×2): 40 mg via ORAL
  Filled 2017-03-20 (×4): qty 2

## 2017-03-20 MED ORDER — PANTOPRAZOLE SODIUM 40 MG PO TBEC
40.0000 mg | DELAYED_RELEASE_TABLET | Freq: Two times a day (BID) | ORAL | Status: DC
  Administered 2017-03-21 – 2017-03-22 (×3): 40 mg via ORAL
  Filled 2017-03-20 (×3): qty 1

## 2017-03-20 MED ORDER — POTASSIUM CHLORIDE CRYS ER 20 MEQ PO TBCR
40.0000 meq | EXTENDED_RELEASE_TABLET | Freq: Every day | ORAL | Status: DC
Start: 2017-03-21 — End: 2017-03-22
  Administered 2017-03-21 – 2017-03-22 (×2): 40 meq via ORAL
  Filled 2017-03-20 (×2): qty 2

## 2017-03-20 MED ORDER — HYDROCODONE-ACETAMINOPHEN 5-325 MG PO TABS
1.0000 | ORAL_TABLET | ORAL | Status: DC | PRN
  Administered 2017-03-20 – 2017-03-21 (×3): 1 via ORAL
  Filled 2017-03-20 (×3): qty 1

## 2017-03-20 MED ORDER — ONDANSETRON 4 MG PO TBDP
4.0000 mg | ORAL_TABLET | Freq: Once | ORAL | Status: AC
  Administered 2017-03-20: 4 mg via ORAL
  Filled 2017-03-20: qty 1

## 2017-03-20 MED ORDER — ONDANSETRON HCL 4 MG/2ML IJ SOLN
4.0000 mg | Freq: Four times a day (QID) | INTRAMUSCULAR | Status: DC | PRN
  Administered 2017-03-21 – 2017-03-22 (×5): 4 mg via INTRAVENOUS
  Filled 2017-03-20 (×5): qty 2

## 2017-03-20 MED ORDER — DEXTROSE 5 % IV SOLN
2.0000 g | Freq: Three times a day (TID) | INTRAVENOUS | Status: DC
  Administered 2017-03-20 – 2017-03-21 (×3): 2 g via INTRAVENOUS
  Filled 2017-03-20 (×4): qty 2

## 2017-03-20 MED ORDER — ENOXAPARIN SODIUM 40 MG/0.4ML ~~LOC~~ SOLN
40.0000 mg | SUBCUTANEOUS | Status: DC
  Administered 2017-03-20 – 2017-03-21 (×2): 40 mg via SUBCUTANEOUS
  Filled 2017-03-20 (×2): qty 0.4

## 2017-03-20 MED ORDER — SODIUM CHLORIDE 0.9 % IV SOLN
INTRAVENOUS | Status: DC
  Administered 2017-03-21 (×2): via INTRAVENOUS

## 2017-03-20 MED ORDER — MORPHINE SULFATE (PF) 4 MG/ML IV SOLN
2.0000 mg | INTRAVENOUS | Status: DC | PRN
  Administered 2017-03-21 (×2): 2 mg via INTRAVENOUS
  Filled 2017-03-20 (×2): qty 1

## 2017-03-20 MED ORDER — VANCOMYCIN HCL IN DEXTROSE 1-5 GM/200ML-% IV SOLN
1000.0000 mg | Freq: Two times a day (BID) | INTRAVENOUS | Status: DC
Start: 2017-03-20 — End: 2017-03-22
  Administered 2017-03-20 – 2017-03-22 (×4): 1000 mg via INTRAVENOUS
  Filled 2017-03-20 (×4): qty 200

## 2017-03-20 MED ORDER — IOPAMIDOL (ISOVUE-370) INJECTION 76%
INTRAVENOUS | Status: AC
  Filled 2017-03-20: qty 100

## 2017-03-20 MED ORDER — ACETAMINOPHEN 325 MG PO TABS: 650.0000 mg | ORAL_TABLET | Freq: Four times a day (QID) | ORAL | Status: DC | PRN

## 2017-03-20 MED ORDER — TRIAMTERENE-HCTZ 37.5-25 MG PO TABS
1.0000 | ORAL_TABLET | Freq: Every day | ORAL | Status: DC
  Administered 2017-03-21: 1 via ORAL
  Filled 2017-03-20 (×2): qty 1

## 2017-03-20 MED ORDER — METOPROLOL SUCCINATE ER 25 MG PO TB24
25.0000 mg | ORAL_TABLET | Freq: Every day | ORAL | Status: DC
  Administered 2017-03-21 – 2017-03-22 (×2): 25 mg via ORAL
  Filled 2017-03-20 (×2): qty 1

## 2017-03-20 NOTE — ED Notes (Signed)
IV team unsuccessful with IV attempt.  Made PA aware.  Another RN to try [place] IV.

## 2017-03-20 NOTE — Progress Notes (Addendum)
Pharmacy Antibiotic Note  Rebekah Henderson is a 61 y.o. female admitted on 03/20/2017 with pneumonia.  Pharmacy has been consulted for vancomycin dosing; Azactam per MD  Plan:  Vancomycin 1000 mg IV q12 hr; goal trough 15-20 mcg/mL  Measure vancomycin trough levels at steady state as indicated  Azactam per MD, dosing appropriate    Height: '5\' 4"'$  (162.6 cm) Weight: 163 lb (73.9 kg) IBW/kg (Calculated) : 54.7  Temp (24hrs), Avg:97.9 F (36.6 C), Min:97.9 F (36.6 C), Max:97.9 F (36.6 C)   Recent Labs Lab 03/20/17 1500 03/20/17 1610  WBC  --  7.7  CREATININE  --  0.60  LATICACIDVEN 1.71  --     Estimated Creatinine Clearance: 70.9 mL/min (by C-G formula based on SCr of 0.6 mg/dL).    Allergies  Allergen Reactions  . Penicillins Hives, Swelling and Other (See Comments)    Reaction:  Face/mouth swelling  Has patient had a PCN reaction causing immediate rash, facial/tongue/throat swelling, SOB or lightheadedness with hypotension: Yes Has patient had a PCN reaction causing severe rash involving mucus membranes or skin necrosis: No Has patient had a PCN reaction that required hospitalization No Has patient had a PCN reaction occurring within the last 10 years: No If all of the above answers are "NO", then may proceed with Cephalosporin use.  . Codeine Nausea Only  . Ranitidine Hcl Other (See Comments)    Reaction:  Dizziness   . Keflex [Cephalexin] Other (See Comments)    Reaction:  Unknown   . Lyrica [Pregabalin] Other (See Comments)    Reaction:  Somnolence     Antimicrobials this admission: Vancomycin 4/19 >>  Aztreonam 4/19 >>   Dose adjustments this admission: ---  Microbiology results: None yet  Thank you for allowing pharmacy to be a part of this patient's care.  Bethzaida Boord A 03/20/2017 6:53 PM

## 2017-03-20 NOTE — ED Provider Notes (Signed)
[facility] DEPT Provider Note   CSN: [id number] Arrival date & time: 03/20/17  1347     History   Chief Complaint Chief Complaint  Patient presents with  . Shortness of Breath  . Nausea    HPI Rebekah Henderson is a 61 y.o. female presenting with 1 week of nausea/vomiting, diarrhea, shortness of breath with sats getting as low as 76% this morning. She typically requires 2 L nasal cannula at nighttime only. She will sometimes use oxygen for her higher level activity but now has to have it at all times. She also reports subjective fever and chills as well as intermittent nonradiating substernal chest pressure. The chest pressure and is nonexertional and not associated with shortness of breath. She also endorses a productive cough with thick foamy sputum.   HPI  Past Medical History:  Diagnosis Date  . 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 Anxiety    takes Prozac daily  . Arthritis   . Arthritis   . Colon polyps   . COPD (chronic obstructive pulmonary disease) ([place])   . Dehydration 03/06/2017  . Diverticulitis   . Dyspnea    with exertion  . Encounter for antineoplastic chemotherapy 12/03/2016  . Fibromyalgia   . GERD (gastroesophageal reflux disease)    takes Pantoprazole daily  . Hypertension    takes Metoprolol,Triamterene-HCTZ,and Amlodipine daily  . Hypothyroidism    takes Synthroid daily  . IBS (irritable bowel syndrome)   . lung ca dx'd 10/02/2016   skin, lung  . PONV (postoperative nausea and vomiting)    pt also states that she had some difficulty breathing after cervical fusion    Patient Active Problem List   Diagnosis Date Noted  . Dehydration 03/06/2017  . Symptomatic anemia 01/04/2017  . Thrombocytopenia ([place]) 01/04/2017  . Influenza A 12/10/2016  . SIRS (systemic inflammatory response syndrome) ([place]) 12/07/2016  . Hypoxia 12/07/2016  . Bronchitis 12/07/2016  . Encounter for antineoplastic chemotherapy 12/03/2016  . Antineoplastic  chemotherapy induced anemia 12/03/2016  . Protein-calorie malnutrition, severe 10/30/2016  . Vomiting 10/29/2016  . Abdominal pain 10/29/2016  . Rash and nonspecific skin eruption 10/29/2016  . Neutropenic fever ([place]) 10/15/2016  . Intractable nausea and vomiting 10/15/2016  . Pancytopenia due to antineoplastic chemotherapy ([place]) 10/15/2016  . Nodular rash 10/15/2016  . Hypokalemia 10/15/2016  . Hypomagnesemia 10/15/2016  . Chronic respiratory failure with hypoxia ([place]) 10/15/2016  . Small cell lung cancer, left ([place]) 09/19/2016  . Mediastinal mass 09/10/2016  . COPD (chronic obstructive pulmonary disease) ([place])   . Hypertension   . Fibromyalgia   . IBS (irritable bowel syndrome)   . GERD (gastroesophageal reflux disease)   . Hypothyroidism   . Colon polyps   . Anxiety   . PONV (postoperative nausea and vomiting)   . Diarrhea 05/12/2013    Past Surgical History:  Procedure Laterality Date  . BIOPSY N/A 05/25/2013   Procedure: BIOPSIES (Random Colon; Duodenal; Gastric);  Surgeon: Danie Binder, MD;  Location: [facility];  Service: Endoscopy;  Laterality: N/A;  . BLADDER SUSPENSION    . BREAST ENHANCEMENT SURGERY    . BREAST IMPLANT REMOVAL    . CERVICAL FUSION  AUG 2013  . CHOLECYSTECTOMY  1999  . COLONOSCOPY  2007 [place]   POLYPS  . COLONOSCOPY WITH PROPOFOL N/A 05/25/2013   Procedure: COLONOSCOPY WITH PROPOFOL(at cecum 0957) total withdrawal time=28mn);  Surgeon: SDanie Binder MD;  Location: [facility];  Service: Endoscopy;  Laterality: N/A;  . ESOPHAGOGASTRODUODENOSCOPY (EGD) WITH  PROPOFOL N/A 05/25/2013   Procedure: ESOPHAGOGASTRODUODENOSCOPY (EGD) WITH PROPOFOL;  Surgeon: Danie Binder, MD;  Location: [facility];  Service: Endoscopy;  Laterality: N/A;  . FOOT SURGERY    . POLYPECTOMY N/A 05/25/2013   Procedure: POLYPECTOMY (Rectal and Gastric);  Surgeon: Danie Binder, MD;  Location: [facility];  Service: Endoscopy;  Laterality: N/A;  . TONSILLECTOMY    . UPPER GASTROINTESTINAL  ENDOSCOPY    . VIDEO BRONCHOSCOPY WITH ENDOBRONCHIAL ULTRASOUND  09/12/2016   Procedure: VIDEO BRONCHOSCOPY WITH ENDOBRONCHIAL ULTRASOUND AND BIOPSY;  Surgeon: Juanito Doom, MD;  Location: [facility];  Service: Cardiopulmonary;;    OB History    No data available       Home Medications    Prior to Admission medications   Medication Sig Start Date End Date Taking? Authorizing Provider  albuterol (PROVENTIL HFA;VENTOLIN HFA) 108 (90 Base) MCG/ACT inhaler Inhale 2 puffs into the lungs every 4 (four) hours as needed for wheezing or shortness of breath.   Yes Historical Provider, MD  albuterol (PROVENTIL) 2 MG tablet Take 2 mg by mouth daily as needed for wheezing or shortness of breath.    Yes Historical Provider, MD  amLODipine (NORVASC) 5 MG tablet Take 5 mg by mouth daily.     Yes Historical Provider, MD  dicyclomine (BENTYL) 20 MG tablet Take 20 mg by mouth 3 (three) times daily as needed for spasms.   Yes Historical Provider, MD  estazolam (PROSOM) 2 MG tablet Take 2 mg by mouth at bedtime.   Yes Historical Provider, MD  estradiol (ESTRACE) 2 MG tablet Take 2 mg by mouth daily.     Yes Historical Provider, MD  FeFum-FePoly-FA-B Cmp-C-Biot (INTEGRA PLUS) CAPS Take 1 capsule by mouth every morning. 03/10/17  Yes Curt Bears, MD  FLUoxetine (PROZAC) 40 MG capsule Take 40 mg by mouth daily.    Yes Historical Provider, MD  HYDROcodone-acetaminophen (NORCO/VICODIN) 5-325 MG tablet Take 1 tablet by mouth every 4 (four) hours as needed for moderate pain.    Yes Historical Provider, MD  levothyroxine (SYNTHROID, LEVOTHROID) 25 MCG tablet Take 25 mcg by mouth daily before breakfast.    Yes Historical Provider, MD  loperamide (IMODIUM) 2 MG capsule Take 2 mg by mouth every 2 (two) hours as needed for diarrhea or loose stools.   Yes Historical Provider, MD  metoprolol succinate (TOPROL-XL) 25 MG 24 hr tablet Take 25 mg by mouth daily.   Yes Historical Provider, MD  Multiple Vitamin (MULTIVITAMIN  WITH MINERALS) TABS tablet Take 2 tablets by mouth daily.   Yes Historical Provider, MD  ondansetron (ZOFRAN ODT) 4 MG disintegrating tablet Take 1 tablet (4 mg total) by mouth every 8 (eight) hours as needed for nausea or vomiting. 03/17/17  Yes Curt Bears, MD  pantoprazole (PROTONIX) 40 MG tablet Take 40 mg by mouth 2 (two) times daily before a meal.    Yes Historical Provider, MD  potassium chloride SA (K-DUR,KLOR-CON) 20 MEQ tablet Take 2 tablets (40 mEq total) by mouth daily. 03/12/17  Yes Curt Bears, MD  triamterene-hydrochlorothiazide (MAXZIDE-25) 37.5-25 MG tablet Take 1 tablet by mouth daily.   Yes Historical Provider, MD  magnesium oxide (MAG-OX) 400 (241.3 Mg) MG tablet Take 2 tablets (800 mg total) by mouth 2 (two) times daily. Patient not taking: Reported on 03/20/2017 01/06/17   Reyne Dumas, MD  OLANZapine (ZYPREXA) 10 MG tablet Take 1 tablet (10 mg total) by mouth at bedtime as needed. Patient not taking: Reported on 03/20/2017 03/17/17  Curt Bears, MD  ondansetron (ZOFRAN) 8 MG tablet TAKE (1) TABLET BY MOUTH THREE TIMES DAILY AS NEEDED. Patient not taking: Reported on 03/20/2017 03/04/17   Curt Bears, MD  oxyCODONE-acetaminophen (PERCOCET) 5-325 MG tablet Take 1-2 tablets by mouth every 4 (four) hours as needed. Patient not taking: Reported on 03/20/2017 03/03/17   Milton Ferguson, MD    Family History Family History  Problem Relation Age of Onset  . Breast cancer Mother   . Diabetes Maternal Grandfather   . Lung cancer Father   . Heart failure Sister 68    Died. Morbidly obese  . Colon cancer Neg Hx   . Colon polyps Neg Hx     Social History Social History  Substance Use Topics  . Smoking status: Former Smoker    Packs/day: 2.00    Years: 20.00    Quit date: 05/20/2004  . Smokeless tobacco: Never Used  . Alcohol use No     Allergies   Penicillins; Codeine; Ranitidine hcl; Keflex [cephalexin]; and Lyrica [pregabalin]   Review of Systems Review of  Systems  Constitutional: Positive for chills and fever.  HENT: Positive for congestion. Negative for ear pain and sore throat.   Eyes: Negative for pain and visual disturbance.  Respiratory: Positive for cough, shortness of breath and wheezing. Negative for choking and stridor.   Cardiovascular: Positive for chest pain. Negative for palpitations and leg swelling.  Gastrointestinal: Positive for diarrhea, nausea and vomiting. Negative for abdominal distention, abdominal pain and blood in stool.  Genitourinary: Negative for difficulty urinating, dysuria, flank pain and hematuria.  Musculoskeletal: Negative for neck pain and neck stiffness.  Skin: Negative for color change, pallor and rash.  Neurological: Negative for seizures and syncope.     Physical Exam Updated Vital Signs BP 114/75 (BP Location: Right Arm)   Pulse 74   Temp 97.9 F (36.6 C) (Oral)   Resp (!) 21   Ht '5\' 4"'$  (1.626 m)   Wt 73.9 kg   SpO2 92%   BMI 27.98 kg/m   Physical Exam  Constitutional: She appears well-developed and well-nourished. No distress.  Afebrile, chronically ill-appearing, sitting comfortably in bed in no acute distress. She is speaking in few word sentences  HENT:  Head: Normocephalic and atraumatic.  Eyes: Conjunctivae and EOM are normal. Right eye exhibits no discharge. Left eye exhibits no discharge.  Neck: Normal range of motion. Neck supple.  Cardiovascular: Normal rate, regular rhythm and normal heart sounds.   No murmur heard. Pulmonary/Chest: Effort normal. No respiratory distress. She has rales.  Bilateral crackles worse on the left  Abdominal: Soft. She exhibits no distension and no mass. There is no tenderness. There is no guarding.  Musculoskeletal: She exhibits no edema.  Neurological: She is alert.  Skin: Skin is warm and dry. She is not diaphoretic. No erythema.  Psychiatric: She has a normal mood and affect.  Nursing note and vitals reviewed.    ED Treatments / Results    Labs (all labs ordered are listed, but only abnormal results are displayed) Labs Reviewed  CBC WITH DIFFERENTIAL/PLATELET - Abnormal; Notable for the following:       Result Value   RBC 3.38 (*)    Hemoglobin 9.5 (*)    HCT 29.8 (*)    RDW 17.3 (*)    Eosinophils Absolute 1.0 (*)    All other components within normal limits  COMPREHENSIVE METABOLIC PANEL  I-STAT CG4 LACTIC ACID, ED  I-STAT TROPOININ, ED    EKG  EKG Interpretation  Date/Time:  Thursday March 20 2017 13:54:55 EDT Ventricular Rate:  82 PR Interval:    QRS Duration: 83 QT Interval:  370 QTC Calculation: 433 R Axis:   42 Text Interpretation:  Sinus rhythm Borderline low voltage, extremity leads Confirmed by Jeneen Rinks  MD, [place] ([id number]) on 03/20/2017 2:27:19 PM       Radiology Dg Chest 2 View  Result Date: 03/20/2017 CLINICAL DATA:  61 year old female with history of shortness of breath, wheezing and crackles in the chest for 1 week. History of lung cancer. EXAM: CHEST  2 VIEW COMPARISON:  Chest x-ray 12/07/2016.  Chest CT 03/03/2017. FINDINGS: Extensive architectural distortion and irregular opacities throughout the left mid to upper lung again noted, correlating to the new areas of airspace disease and septal thickening seen on the recent chest CT. Small left pleural effusion appears to be new. Right lung is clear. No right pleural effusion. No evidence of pulmonary edema. Heart size is normal. Upper mediastinal contours are distorted on the left related to underlying malignancy including AP window adenopathy, as well as evolving post treatment related changes. Plain screw fixation device in the lower cervical spine incidentally noted. IMPRESSION: 1. 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 2. New small left pleural effusion. Electronically Signed   By: Vinnie Langton M.D.   On: 03/20/2017 14:40    Procedures Procedures  (including critical care time)  Medications Ordered in ED Medications  ondansetron (ZOFRAN-ODT) disintegrating tablet 4 mg (4 mg Oral Given 03/20/17 1612)     Initial Impression / Assessment and Plan / ED Course  I have reviewed the triage vital signs and the nursing notes.  Pertinent labs & imaging results that were available during my care of the patient were reviewed by me and considered in my medical decision making (see chart for details).     Patient with history of COPD, left breast malignancy presenting with worsening shortness of breath, oxygen requirement during the day and intermittent chest pressures. She typically uses 2L/min at night. She has been requiring O2 at all times to maintain sats. She has been in the low 90s in the ED on 2L.  She has been laying in bed for the past week. Recently completed chemotherapy 2 weeks ago and radiation.   Patient's presenting symptoms and history as well as physical exam concerning for PE. Ordered CTa. Patient is a difficult stick IV team consult placed.  Transferred care at end of shift to Montine Circle, PA-C pending lab results and CT results.  Anticipate admission regardless due to new oxygen requirement, sob and chest pain in this high risk patient.  Patient was discussed with Dr. Jeneen Rinks who has seen patient and agrees with assessment and plan.  Final Clinical Impressions(s) / ED Diagnoses   Final diagnoses:  Shortness of breath    New Prescriptions New Prescriptions   No medications on file     Dossie Der 03/20/17 [place], MD 03/22/17 1731

## 2017-03-20 NOTE — ED Triage Notes (Addendum)
Pt c/o nausea, emesis, diarrhea x 1 week, emesis causes SOB. Pt reports pulse oximetry reading of 76% this morning while wearing 2 L/min O2, reports O2 saturation dropping to mid-low 80%s with ambulation. Last chemo March 7th, Radiation end of February. Crackles left mid lung.

## 2017-03-20 NOTE — ED Notes (Signed)
IV team at bedside 

## 2017-03-20 NOTE — Telephone Encounter (Signed)
Patient calling to say she is SOB on E. Nauseated, and has chest pain. Has a driver and is on her way to the E.R. Notified dr Worthy Flank nurse

## 2017-03-20 NOTE — ED Notes (Signed)
Charge RN at bedside attempting IV access.

## 2017-03-20 NOTE — ED Provider Notes (Signed)
Pt seen and evaluated. S/W PA. Pt normally on 2L [place] O2 QHS. More dyspneic today, even with O2 at home. MOre cough. No fever. CP only with cough. XR shows progression ro LUL abnormality--Pneumonitis, infiltrate, PE. Await Labs/CT.   Tanna Furry, MD 03/20/17 1600

## 2017-03-20 NOTE — ED Notes (Signed)
Pt in CT.

## 2017-03-20 NOTE — H&P (Signed)
History and Physical    Rebekah Henderson CLE:[id number] DOB: 1956-09-16 DOA: 03/20/2017  PCP: Glo Herring, MD Consultants:  Oncology - Laurette Schimke - rad onc Patient coming from: home - lives alone; NOK: friend, Joaquim Lai, [phone], [phone]  Chief Complaint: SOB  HPI: Rebekah Henderson is a 61 y.o. female with medical history significant of limited stage (T1b, N2, M0) small cell lung cancer s/p 6 cycles of systemic chemotherapy mainly with carboplatin and etoposide as well as COPD with chronic respiratory failure, GERD, hypertension, and anxiety presenting because "I couldn't breathe".  Nausea and diarrhea x 1 week.  Breathing got worse and worse, unable to walk from 1 room to the other.  Also with chest pain.  Has lung cancer, chronic SOB with 2L qhs O2.  Over the last week and a half, getting worse.  Has been wearing O2 24/7 for several days. +cough, sometimes productive of yellow-green sputum.  +fevers, subjective.  Dr. Julien Nordmann did not think N/V/D would be a side effect of chemo at this point.  Substernal chest pain for about a week but more frequent the last few days, progressively worse, occasional.  Worse with coughing and deep breaths.  Last chemo was 3/7, last radiation was the end of February.  Discussing preventative brain radiation but this has not been scheduled.  Cancer is thought to be inactive.   ED Course: Acute on chronic respiratory failure with superimposed PNA over radiation pneumonitis on CT.  Review of Systems: As per HPI; otherwise  review of systems reviewed and negative.   Ambulatory Status:  Generally does not require assistance with ambulation but currently would need a walker  Past Medical History:  Diagnosis Date  . 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 Anxiety    takes Prozac daily  . Arthritis   . Colon polyps   . COPD (chronic obstructive pulmonary disease) ([place])   . Dehydration 03/06/2017  . Diverticulitis   . Dyspnea    with  exertion  . Encounter for antineoplastic chemotherapy 12/03/2016  . Fibromyalgia   . GERD (gastroesophageal reflux disease)    takes Pantoprazole daily  . Hypertension    takes Metoprolol,Triamterene-HCTZ,and Amlodipine daily  . Hypothyroidism    takes Synthroid daily  . IBS (irritable bowel syndrome)   . lung ca dx'd 10/02/2016   skin, lung  . PONV (postoperative nausea and vomiting)    pt also states that she had some difficulty breathing after cervical fusion    Past Surgical History:  Procedure Laterality Date  . BIOPSY N/A 05/25/2013   Procedure: BIOPSIES (Random Colon; Duodenal; Gastric);  Surgeon: Danie Binder, MD;  Location: [facility];  Service: Endoscopy;  Laterality: N/A;  . BLADDER SUSPENSION    . BREAST ENHANCEMENT SURGERY    . BREAST IMPLANT REMOVAL    . CERVICAL FUSION  AUG 2013  . CHOLECYSTECTOMY  1999  . COLONOSCOPY  2007 [place]   POLYPS  . COLONOSCOPY WITH PROPOFOL N/A 05/25/2013   Procedure: COLONOSCOPY WITH PROPOFOL(at cecum 0957) total withdrawal time=86mn);  Surgeon: SDanie Binder MD;  Location: [facility];  Service: Endoscopy;  Laterality: N/A;  . ESOPHAGOGASTRODUODENOSCOPY (EGD) WITH PROPOFOL N/A 05/25/2013   Procedure: ESOPHAGOGASTRODUODENOSCOPY (EGD) WITH PROPOFOL;  Surgeon: SDanie Binder MD;  Location: [facility];  Service: Endoscopy;  Laterality: N/A;  . FOOT SURGERY    . POLYPECTOMY N/A 05/25/2013   Procedure: POLYPECTOMY (Rectal and Gastric);  Surgeon: SDanie Binder MD;  Location: [facility];  Service: Endoscopy;  Laterality: N/A;  . TONSILLECTOMY    . UPPER GASTROINTESTINAL ENDOSCOPY    . VIDEO BRONCHOSCOPY WITH ENDOBRONCHIAL ULTRASOUND  09/12/2016   Procedure: VIDEO BRONCHOSCOPY WITH ENDOBRONCHIAL ULTRASOUND AND BIOPSY;  Surgeon: Juanito Doom, MD;  Location: [facility];  Service: Cardiopulmonary;;    Social History   Social History  . Marital status: Divorced    Spouse name: N/A  . Number of children: 1  . Years of education: College    Occupational  History  . Retired     Social History Main Topics  . Smoking status: Former Smoker    Packs/day: 2.00    Years: 20.00    Quit date: 05/20/2004  . Smokeless tobacco: Never Used  . Alcohol use No  . Drug use: No  . Sexual activity: Not on file   Other Topics Concern  . Not on file   Social History Narrative   Lives alone.  Retired from [employer] Reactions  . Penicillins Hives, Swelling and Other (See Comments)    Reaction:  Face/mouth swelling  Has patient had a PCN reaction causing immediate rash, facial/tongue/throat swelling, SOB or lightheadedness with hypotension: Yes Has patient had a PCN reaction causing severe rash involving mucus membranes or skin necrosis: No Has patient had a PCN reaction that required hospitalization No Has patient had a PCN reaction occurring within the last 10 years: No If all of the above answers are "NO", then may proceed with Cephalosporin use.  . Codeine Nausea Only  . Ranitidine Hcl Other (See Comments)    Reaction:  Dizziness   . Keflex [Cephalexin] Other (See Comments)    Reaction:  Unknown   . Lyrica [Pregabalin] Other (See Comments)    Reaction:  Somnolence     Family History  Problem Relation Age of Onset  . Breast cancer Mother   . Diabetes Maternal Grandfather   . Lung cancer Father   . Heart failure Sister 61    Died. Morbidly obese  . Colon cancer Neg Hx   . Colon polyps Neg Hx     Prior to Admission medications   Medication Sig Start Date End Date Taking? Authorizing Provider  albuterol (PROVENTIL HFA;VENTOLIN HFA) 108 (90 Base) MCG/ACT inhaler Inhale 2 puffs into the lungs every 4 (four) hours as needed for wheezing or shortness of breath.   Yes Historical Provider, MD  albuterol (PROVENTIL) 2 MG tablet Take 2 mg by mouth daily as needed for wheezing or shortness of breath.    Yes Historical Provider, MD  amLODipine (NORVASC) 5 MG tablet Take 5 mg by mouth daily.     Yes Historical Provider, MD   dicyclomine (BENTYL) 20 MG tablet Take 20 mg by mouth 3 (three) times daily as needed for spasms.   Yes Historical Provider, MD  estazolam (PROSOM) 2 MG tablet Take 2 mg by mouth at bedtime.   Yes Historical Provider, MD  estradiol (ESTRACE) 2 MG tablet Take 2 mg by mouth daily.     Yes Historical Provider, MD  FeFum-FePoly-FA-B Cmp-C-Biot (INTEGRA PLUS) CAPS Take 1 capsule by mouth every morning. 03/10/17  Yes Curt Bears, MD  FLUoxetine (PROZAC) 40 MG capsule Take 40 mg by mouth daily.    Yes Historical Provider, MD  HYDROcodone-acetaminophen (NORCO/VICODIN) 5-325 MG tablet Take 1 tablet by mouth every 4 (four) hours as needed for moderate pain.    Yes Historical Provider, MD  levothyroxine (SYNTHROID, LEVOTHROID) 25 MCG tablet Take 25 mcg by  mouth daily before breakfast.    Yes Historical Provider, MD  loperamide (IMODIUM) 2 MG capsule Take 2 mg by mouth every 2 (two) hours as needed for diarrhea or loose stools.   Yes Historical Provider, MD  metoprolol succinate (TOPROL-XL) 25 MG 24 hr tablet Take 25 mg by mouth daily.   Yes Historical Provider, MD  Multiple Vitamin (MULTIVITAMIN WITH MINERALS) TABS tablet Take 2 tablets by mouth daily.   Yes Historical Provider, MD  ondansetron (ZOFRAN ODT) 4 MG disintegrating tablet Take 1 tablet (4 mg total) by mouth every 8 (eight) hours as needed for nausea or vomiting. 03/17/17  Yes Curt Bears, MD  pantoprazole (PROTONIX) 40 MG tablet Take 40 mg by mouth 2 (two) times daily before a meal.    Yes Historical Provider, MD  potassium chloride SA (K-DUR,KLOR-CON) 20 MEQ tablet Take 2 tablets (40 mEq total) by mouth daily. 03/12/17  Yes Curt Bears, MD  triamterene-hydrochlorothiazide (MAXZIDE-25) 37.5-25 MG tablet Take 1 tablet by mouth daily.   Yes Historical Provider, MD  magnesium oxide (MAG-OX) 400 (241.3 Mg) MG tablet Take 2 tablets (800 mg total) by mouth 2 (two) times daily. Patient not taking: Reported on 03/20/2017 01/06/17   Reyne Dumas, MD    OLANZapine (ZYPREXA) 10 MG tablet Take 1 tablet (10 mg total) by mouth at bedtime as needed. Patient not taking: Reported on 03/20/2017 03/17/17   Curt Bears, MD  ondansetron (ZOFRAN) 8 MG tablet TAKE (1) TABLET BY MOUTH THREE TIMES DAILY AS NEEDED. Patient not taking: Reported on 03/20/2017 03/04/17   Curt Bears, MD  oxyCODONE-acetaminophen (PERCOCET) 5-325 MG tablet Take 1-2 tablets by mouth every 4 (four) hours as needed. Patient not taking: Reported on 03/20/2017 03/03/17   Milton Ferguson, MD    Physical Exam: Vitals:   03/20/17 1915 03/20/17 1930 03/20/17 1935 03/20/17 2019  BP:   112/78 125/70  Pulse: 72 70  68  Resp: (!) 21 (!) 22  20  Temp:    97.6 F (36.4 C)  TempSrc:    Oral  SpO2: 95% 96%  100%  Weight:    77.1 kg (169 lb 15.6 oz)  Height:    '5\' 4"'$  (1.626 m)     General: Appears calm and comfortable and is NAD, lying mostly flat, hair is quite short Eyes:  PERRL, EOMI, normal lids, iris ENT:  grossly normal hearing, lips & tongue, mmm Neck:  no LAD, masses or thyromegaly Cardiovascular:  RRR, no m/r/g. No LE edema.  Respiratory:  CTA bilaterally, no w/r/r. Normal respiratory effort. Abdomen:  soft, ntnd, NABS Skin:  no rash or induration seen on limited exam Musculoskeletal:  grossly normal tone BUE/BLE, good ROM, no bony abnormality Psychiatric:  grossly normal mood and affect, speech fluent and appropriate, AOx3 Neurologic:  CN 2-12 grossly intact, moves all extremities in coordinated fashion, sensation intact  Labs on Admission: I have personally reviewed following labs and imaging studies  CBC:  Recent Labs Lab 03/20/17 1610  WBC 7.7  NEUTROABS 4.8  HGB 9.5*  HCT 29.8*  MCV 88.2  PLT 546   Basic Metabolic Panel:  Recent Labs Lab 03/20/17 1610  NA 139  K 3.9  CL 104  CO2 26  GLUCOSE 89  BUN 5*  CREATININE 0.60  CALCIUM 8.3*   GFR: Estimated Creatinine Clearance: 72.4 mL/min (by C-G formula based on SCr of 0.6 mg/dL). Liver Function  Tests:  Recent Labs Lab 03/20/17 1610  AST 14*  ALT 7*  ALKPHOS 43  BILITOT 0.7  PROT 6.3*  ALBUMIN 3.0*   No results for input(s): LIPASE, AMYLASE in the last 168 hours. No results for input(s): AMMONIA in the last 168 hours. Coagulation Profile: No results for input(s): INR, PROTIME in the last 168 hours. Cardiac Enzymes: No results for input(s): CKTOTAL, CKMB, CKMBINDEX, TROPONINI in the last 168 hours. BNP (last 3 results) No results for input(s): PROBNP in the last 8760 hours. HbA1C: No results for input(s): HGBA1C in the last 72 hours. CBG: No results for input(s): GLUCAP in the last 168 hours. Lipid Profile: No results for input(s): CHOL, HDL, LDLCALC, TRIG, CHOLHDL, LDLDIRECT in the last 72 hours. Thyroid Function Tests: No results for input(s): TSH, T4TOTAL, FREET4, T3FREE, THYROIDAB in the last 72 hours. Anemia Panel: No results for input(s): VITAMINB12, FOLATE, FERRITIN, TIBC, IRON, RETICCTPCT in the last 72 hours. Urine analysis:    Component Value Date/Time   COLORURINE YELLOW 03/03/2017 0745   APPEARANCEUR CLEAR 03/03/2017 0745   LABSPEC 1.006 03/03/2017 0745   PHURINE 7.0 03/03/2017 0745   GLUCOSEU NEGATIVE 03/03/2017 0745   HGBUR NEGATIVE 03/03/2017 0745   BILIRUBINUR NEGATIVE 03/03/2017 0745   KETONESUR NEGATIVE 03/03/2017 0745   PROTEINUR NEGATIVE 03/03/2017 0745   UROBILINOGEN 0.2 03/10/2014 1027   NITRITE NEGATIVE 03/03/2017 0745   LEUKOCYTESUR NEGATIVE 03/03/2017 0745    Creatinine Clearance: Estimated Creatinine Clearance: 72.4 mL/min (by C-G formula based on SCr of 0.6 mg/dL).  Sepsis Labs: '@LABRCNTIP'$ (procalcitonin:4,lacticidven:4) )No results found for this or any previous visit (from the past 240 hour(s)).   Radiological Exams on Admission: Dg Chest 2 View  Result Date: 03/20/2017 CLINICAL DATA:  61 year old female with history of shortness of breath, wheezing and crackles in the chest for 1 week. History of lung cancer. EXAM: CHEST  2  VIEW COMPARISON:  Chest x-ray 12/07/2016.  Chest CT 03/03/2017. FINDINGS: Extensive architectural distortion and irregular opacities throughout the left mid to upper lung again noted, correlating to the new areas of airspace disease and septal thickening seen on the recent chest CT. Small left pleural effusion appears to be new. Right lung is clear. No right pleural effusion. No evidence of pulmonary edema. Heart size is normal. Upper mediastinal contours are distorted on the left related to underlying malignancy including AP window adenopathy, as well as evolving post treatment related changes. Plain screw fixation device in the lower cervical spine incidentally noted. IMPRESSION: 1. 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 2. New small left pleural effusion. Electronically Signed   By: Vinnie Langton M.D.   On: 03/20/2017 14:40   Ct Angio Chest Pe W And/or Wo Contrast  Result Date: 03/20/2017 CLINICAL DATA:  Acute onset of nausea, vomiting and diarrhea. Shortness of breath. Decreased O2 saturation. Subjective fever and chills, with substernal chest pressure. Productive cough. Initial encounter. EXAM: CT ANGIOGRAPHY CHEST WITH CONTRAST TECHNIQUE: Multidetector CT imaging of the chest was performed using the standard protocol during bolus administration of intravenous contrast. Multiplanar CT image reconstructions and MIPs were obtained to evaluate the vascular anatomy. CONTRAST:  100 mL of Isovue 370 IV contrast COMPARISON:  CT of the chest performed 03/03/2017, and chest radiograph performed earlier today at 2:29 p.m. FINDINGS: Cardiovascular:  There is no evidence of pulmonary embolus. The heart is borderline enlarged. The ascending thoracic aorta is borderline normal in diameter, measuring up to 3.9 cm. The great vessels are grossly unremarkable in appearance. Mediastinum/Nodes: The previously noted mass at the  aortopulmonary window appears relatively stable, measuring approximately 3.3 x 2.9 cm. Mild surrounding soft tissue inflammation is seen. Trace pericardial fluid remains within normal limits. A small to moderate hiatal hernia is noted. The visualized portions of the thyroid gland are unremarkable. No axillary lymphadenopathy is seen, though the axilla are only partially imaged on this study. Lungs/Pleura: There is significantly worsened left central upper and lower lobe airspace opacification, and haziness involving the remainder of the left lung, concerning for diffuse pneumonia superimposed on the patient's radiation pneumonitis. An increasing small left pleural effusion is also seen. The right lung appears relatively clear. No pneumothorax is seen. Underlying recurrent mass cannot be excluded, but is not well assessed given left-sided airspace opacification. Upper Abdomen: The visualized portions of the liver and spleen are grossly unremarkable. The visualized portions of the pancreas, adrenal glands and kidneys are within normal limits. Musculoskeletal: No acute osseous abnormalities are identified. Cervical spinal fusion hardware is partially imaged. The visualized musculature is unremarkable in appearance. Review of the MIP images confirms the above findings. IMPRESSION: 1. No evidence of pulmonary embolus. 2. Significantly worsened left central upper and lower lobe airspace opacification, and haziness involving the remainder of the left lung, concerning for diffuse pneumonia superimposed on the patient's radiation pneumonitis. Increasing small left pleural effusion also seen. Underlying recurrent mass cannot be excluded, but is not well assessed given left-sided airspace opacification. 3. 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 4. Small to moderate hiatal hernia. 5. Borderline cardiomegaly. Electronically  Signed   By: Garald Balding M.D.   On: 03/20/2017 18:20    EKG: Independently reviewed.  NSR with rate 82; low voltage with no evidence of acute ischemia  Assessment/Plan Principal Problem:   Acute on chronic respiratory failure with hypoxia (HCC) Active Problems:   Hypertension   Small cell lung cancer, left (HCC)   Antineoplastic chemotherapy induced anemia   HCAP (healthcare-associated pneumonia)   Mild protein-calorie malnutrition (HCC)   Acute on chronic respiratory failure from HCAP superimposed on COPD and radiation pneumonitis -Given productive cough, mildly decreased oxygen saturation, and infiltrate in left lung on chest CT , most likely hospital-associated pneumonia.  -Influenza negative. -CURB-65 score is 0, mortality 0.7%. However, this does not take the lung cancer into account. -The Pneumonia Severity Index score is 63, Class 2, with a 0.6% mortality rate. -Will start Aztreonam and Vancomycin as per the PCN-allergic treatment algorithm. -NS @ 75cc/hr -Fever control -Repeat CBC in am -Sputum cultures -Blood cultures -Strep pneumo testing -Will order non-ICU procalcitonin algorithm.  >0.5 indicates infection and >>0.5 indicates more serious disease.  As the procalcitonin level normalizes, it will be reasonable to consider de-escalation of antibiotic coverage. -albuterol PRN -Troponin 0.00 -Lactate 1.71 -Supplemental O2 to keep sats 88-92%  Small cell lung cancer -Will need f/u chest CT after resolution of PNA to ensure there is not a persistent mass there -Otherwise, appears to be stable but not resolved -Outpatient f/u with oncology seems appropriate for now  HTN -Continue Norvasc, Toprol, Maxzide  Malnutrition -Albumin 3.0 -Nutrition consult  Anemia -Hgb 9.5, stable  Hypothyroidism -Continue Synthroid at current dose -Normal TSH and free T4 in 1/18   DVT prophylaxis:  Lovenox  Code Status:  Full - confirmed with patient/family Family  Communication: Ex-husband present throughout evalaution Disposition Plan:  Home once clinically improved Consults called: None  Admission status: Admit - It is my clinical opinion that admission to [facility]  is reasonable and necessary because this patient will require at least 2 midnights in the hospital to treat this condition based on the medical complexity of the problems presented.  Given the aforementioned information, the predictability of an adverse outcome is felt to be significant.    Karmen Bongo MD [facility]  If 7PM-7AM, please contact night-coverage [email or web address] Password [facility]  03/21/2017, 12:17 AM

## 2017-03-20 NOTE — ED Notes (Signed)
Patient transported to X-ray 

## 2017-03-20 NOTE — ED Provider Notes (Signed)
Patient signed out to me.    Patient with SOB, new hypoxia.  Normally wears O2 at night only.  Concern for metastatic breast cancer.  Will need PE study.   Patient seen by and discussed with Dr. Jeneen Rinks, who recommends admission for new oxygen requirement and dyspnea.  CT PE study shows superimposed pneumonia over radiation pneumonitis. Patient will require IV antibiotic therapy.  Appreciate Dr. Lorin Mercy for bringing the patient to the hospital.  Results for orders placed or performed during the hospital encounter of 03/20/17  Comprehensive metabolic panel  Result Value Ref Range   Sodium 139 135 - 145 mmol/L   Potassium 3.9 3.5 - 5.1 mmol/L   Chloride 104 101 - 111 mmol/L   CO2 26 22 - 32 mmol/L   Glucose, Bld 89 65 - 99 mg/dL   BUN 5 (L) 6 - 20 mg/dL   Creatinine, Ser 0.60 0.44 - 1.00 mg/dL   Calcium 8.3 (L) 8.9 - 10.3 mg/dL   Total Protein 6.3 (L) 6.5 - 8.1 g/dL   Albumin 3.0 (L) 3.5 - 5.0 g/dL   AST 14 (L) 15 - 41 U/L   ALT 7 (L) 14 - 54 U/L   Alkaline Phosphatase 43 38 - 126 U/L   Total Bilirubin 0.7 0.3 - 1.2 mg/dL   GFR calc non Af Amer >60 >60 mL/min   GFR calc Af Amer >60 >60 mL/min   Anion gap 9 5 - 15  CBC with Differential  Result Value Ref Range   WBC 7.7 4.0 - 10.5 K/uL   RBC 3.38 (L) 3.87 - 5.11 MIL/uL   Hemoglobin 9.5 (L) 12.0 - 15.0 g/dL   HCT 29.8 (L) 36.0 - 46.0 %   MCV 88.2 78.0 - 100.0 fL   MCH 28.1 26.0 - 34.0 pg   MCHC 31.9 30.0 - 36.0 g/dL   RDW 17.3 (H) 11.5 - 15.5 %   Platelets 281 150 - 400 K/uL   Neutrophils Relative % 62 %   Neutro Abs 4.8 1.7 - 7.7 K/uL   Lymphocytes Relative 18 %   Lymphs Abs 1.4 0.7 - 4.0 K/uL   Monocytes Relative 7 %   Monocytes Absolute 0.6 0.1 - 1.0 K/uL   Eosinophils Relative 13 %   Eosinophils Absolute 1.0 (H) 0.0 - 0.7 K/uL   Basophils Relative 0 %   Basophils Absolute 0.0 0.0 - 0.1 K/uL  I-Stat CG4 Lactic Acid, ED  Result Value Ref Range   Lactic Acid, Venous 1.71 0.5 - 1.9 mmol/L  I-stat troponin, ED  Result  Value Ref Range   Troponin i, poc 0.00 0.00 - 0.08 ng/mL   Comment 3           Dg Chest 2 View  Result Date: 03/20/2017 CLINICAL DATA:  61 year old female with history of shortness of breath, wheezing and crackles in the chest for 1 week. History of lung cancer. EXAM: CHEST  2 VIEW COMPARISON:  Chest x-ray 12/07/2016.  Chest CT 03/03/2017. FINDINGS: Extensive architectural distortion and irregular opacities throughout the left mid to upper lung again noted, correlating to the new areas of airspace disease and septal thickening seen on the recent chest CT. Small left pleural effusion appears to be new. Right lung is clear. No right pleural effusion. No evidence of pulmonary edema. Heart size is normal. Upper mediastinal contours are distorted on the left related to underlying malignancy including AP window adenopathy, as well as evolving post treatment related changes. Plain screw fixation device in the  lower cervical spine incidentally noted. IMPRESSION: 1. 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 2. New small left pleural effusion. Electronically Signed   By: Vinnie Langton M.D.   On: 03/20/2017 14:40   Ct Chest W Contrast  Result Date: 03/03/2017 CLINICAL DATA:  Followup lung carcinoma. Ongoing chemotherapy. Recently completed radiation therapy. Left-sided abdominal and back pain, nausea and vomiting, diarrhea for 3 days. EXAM: CT CHEST, ABDOMEN, AND PELVIS WITH CONTRAST TECHNIQUE: Multidetector CT imaging of the chest, abdomen and pelvis was performed following the standard protocol during bolus administration of intravenous contrast. CONTRAST:  181m ISOVUE-300 IOPAMIDOL (ISOVUE-300) INJECTION 61% COMPARISON:  Chest CT on 01/06/2017 and PET-CT 09/27/2016 FINDINGS: CT CHEST FINDINGS Cardiovascular: No acute findings. Mediastinum/Lymph Nodes: Mediastinal lymphadenopathy in the AP window shows no significant  change, measuring 3.3 x 3.2 cm on image 20/3. No new masses or lymphadenopathy identified. New airspace disease is seen within the left upper and lower lobes in a predominant paramediastinal distribution which is suspicious for radiation pneumonitis, although pneumonia cannot be excluded. No discrete pulmonary nodules or masses are identified. Mild right lower lobe scarring is stable. Right lung is otherwise clear. No evidence of pleural effusion. Lungs/Pleura: No pulmonary infiltrate or mass identified. No effusion present. Musculoskeletal:  No suspicious bone lesions identified. CT ABDOMEN AND PELVIS FINDINGS Hepatobiliary: No masses identified. Mild hepatic steatosis. Prior cholecystectomy noted. No evidence of biliary dilatation. Pancreas:  No mass or inflammatory changes. Spleen:  Within normal limits in size and appearance. Adrenals/Urinary tract: Tiny low-attenuation lesion in upper pole of right kidney is too small to characterize. No definite masses or hydronephrosis. Stomach/Bowel: No evidence of obstruction, inflammatory process, or abnormal fluid collections. Colonic diverticulosis again noted, without evidence of diverticulitis. Vascular/Lymphatic: No pathologically enlarged lymph nodes identified. No abdominal aortic aneurysm. Aortic atherosclerosis. Reproductive: Prior hysterectomy noted. Adnexal regions are unremarkable in appearance. Other:  None. Musculoskeletal:  No suspicious bone lesions identified. IMPRESSION: Stable mediastinal lymphadenopathy in the AP window. No new or progressive metastatic disease identified. New left upper and lower lobe airspace disease and prominent paramediastinal distribution, highly suspicious for radiation pneumonitis although pneumonia cannot be excluded. No acute findings within the abdomen or pelvis. Electronically Signed   By: JEarle GellM.D.   On: 03/03/2017 09:32   Ct Angio Chest Pe W And/or Wo Contrast  Result Date: 03/20/2017 CLINICAL DATA:  Acute onset  of nausea, vomiting and diarrhea. Shortness of breath. Decreased O2 saturation. Subjective fever and chills, with substernal chest pressure. Productive cough. Initial encounter. EXAM: CT ANGIOGRAPHY CHEST WITH CONTRAST TECHNIQUE: Multidetector CT imaging of the chest was performed using the standard protocol during bolus administration of intravenous contrast. Multiplanar CT image reconstructions and MIPs were obtained to evaluate the vascular anatomy. CONTRAST:  100 mL of Isovue 370 IV contrast COMPARISON:  CT of the chest performed 03/03/2017, and chest radiograph performed earlier today at 2:29 p.m. FINDINGS: Cardiovascular:  There is no evidence of pulmonary embolus. The heart is borderline enlarged. The ascending thoracic aorta is borderline normal in diameter, measuring up to 3.9 cm. The great vessels are grossly unremarkable in appearance. 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 Mild surrounding soft tissue inflammation is seen. Trace pericardial fluid remains within normal limits. A small to moderate hiatal hernia is noted. The visualized portions of the thyroid gland are unremarkable. No axillary lymphadenopathy is seen, though the axilla  are only partially imaged on this study. Lungs/Pleura: There is significantly worsened left central upper and lower lobe airspace opacification, and haziness involving the remainder of the left lung, concerning for diffuse pneumonia superimposed on the patient's radiation pneumonitis. An increasing small left pleural effusion is also seen. The right lung appears relatively clear. No pneumothorax is seen. Underlying recurrent mass cannot be excluded, but is not well assessed given left-sided airspace opacification. Upper Abdomen: The visualized portions of the liver and spleen are grossly unremarkable. The visualized portions of the pancreas, adrenal glands and kidneys are  within normal limits. Musculoskeletal: No acute osseous abnormalities are identified. Cervical spinal fusion hardware is partially imaged. The visualized musculature is unremarkable in appearance. Review of the MIP images confirms the above findings. IMPRESSION: 1. No evidence of pulmonary embolus. 2. Significantly worsened left central upper and lower lobe airspace opacification, and haziness involving the remainder of the left lung, concerning for diffuse pneumonia superimposed on the patient's radiation pneumonitis. Increasing small left pleural effusion also seen. Underlying recurrent mass cannot be excluded, but is not well assessed given left-sided airspace opacification. 3. 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 4. Small to moderate hiatal hernia. 5. Borderline cardiomegaly. Electronically Signed   By: Garald Balding M.D.   On: 03/20/2017 18:20   Ct Abdomen Pelvis W Contrast  Result Date: 03/03/2017 CLINICAL DATA:  Followup lung carcinoma. Ongoing chemotherapy. Recently completed radiation therapy. Left-sided abdominal and back pain, nausea and vomiting, diarrhea for 3 days. EXAM: CT CHEST, ABDOMEN, AND PELVIS WITH CONTRAST TECHNIQUE: Multidetector CT imaging of the chest, abdomen and pelvis was performed following the standard protocol during bolus administration of intravenous contrast. CONTRAST:  111m ISOVUE-300 IOPAMIDOL (ISOVUE-300) INJECTION 61% COMPARISON:  Chest CT on 01/06/2017 and PET-CT 09/27/2016 FINDINGS: CT CHEST FINDINGS Cardiovascular: No acute findings. Mediastinum/Lymph Nodes: Mediastinal lymphadenopathy in the AP window shows no significant change, measuring 3.3 x 3.2 cm on image 20/3. No new masses or lymphadenopathy identified. New airspace disease is seen within the left upper and lower lobes in a predominant paramediastinal distribution which is suspicious for radiation  pneumonitis, although pneumonia cannot be excluded. No discrete pulmonary nodules or masses are identified. Mild right lower lobe scarring is stable. Right lung is otherwise clear. No evidence of pleural effusion. Lungs/Pleura: No pulmonary infiltrate or mass identified. No effusion present. Musculoskeletal:  No suspicious bone lesions identified. CT ABDOMEN AND PELVIS FINDINGS Hepatobiliary: No masses identified. Mild hepatic steatosis. Prior cholecystectomy noted. No evidence of biliary dilatation. Pancreas:  No mass or inflammatory changes. Spleen:  Within normal limits in size and appearance. Adrenals/Urinary tract: Tiny low-attenuation lesion in upper pole of right kidney is too small to characterize. No definite masses or hydronephrosis. Stomach/Bowel: No evidence of obstruction, inflammatory process, or abnormal fluid collections. Colonic diverticulosis again noted, without evidence of diverticulitis. Vascular/Lymphatic: No pathologically enlarged lymph nodes identified. No abdominal aortic aneurysm. Aortic atherosclerosis. Reproductive: Prior hysterectomy noted. Adnexal regions are unremarkable in appearance. Other:  None. Musculoskeletal:  No suspicious bone lesions identified. IMPRESSION: Stable mediastinal lymphadenopathy in the AP window. No new or progressive metastatic disease identified. New left upper and lower lobe airspace disease and prominent paramediastinal distribution, highly suspicious for radiation pneumonitis although pneumonia cannot be excluded. No acute findings within the abdomen or pelvis. Electronically Signed   By: JEarle GellM.D.   On: 03/03/2017 09:32      RMontine Circle PA-C 03/20/17 [id number]  Orlie Dakin, MD 03/21/17 [id number]

## 2017-03-21 DIAGNOSIS — J9621 Acute and chronic respiratory failure with hypoxia: Secondary | ICD-10-CM | POA: Diagnosis present

## 2017-03-21 DIAGNOSIS — C3492 Malignant neoplasm of unspecified part of left bronchus or lung: Secondary | ICD-10-CM

## 2017-03-21 DIAGNOSIS — E441 Mild protein-calorie malnutrition: Secondary | ICD-10-CM | POA: Diagnosis present

## 2017-03-21 DIAGNOSIS — J189 Pneumonia, unspecified organism: Principal | ICD-10-CM

## 2017-03-21 LAB — BASIC METABOLIC PANEL
Anion gap: 9 (ref 5–15)
BUN: 5 mg/dL — AB (ref 6–20)
CHLORIDE: 104 mmol/L (ref 101–111)
CO2: 25 mmol/L (ref 22–32)
Calcium: 8 mg/dL — ABNORMAL LOW (ref 8.9–10.3)
Creatinine, Ser: 0.54 mg/dL (ref 0.44–1.00)
GFR calc Af Amer: >60 mL/min (ref >60)
GFR calc non Af Amer: >60 mL/min (ref >60)
GLUCOSE: 91 mg/dL (ref 65–99)
POTASSIUM: 3.7 mmol/L (ref 3.5–5.1)
Sodium: 138 mmol/L (ref 135–145)

## 2017-03-21 LAB — CBC WITH DIFFERENTIAL/PLATELET
BASOS ABS: 0 10*3/uL (ref 0.0–0.1)
BASOS PCT: 0 %
Eosinophils Absolute: 1.1 10*3/uL — ABNORMAL HIGH (ref 0.0–0.7)
Eosinophils Relative: 17 %
HEMATOCRIT: 30.8 % — AB (ref 36.0–46.0)
HEMOGLOBIN: 9.8 g/dL — AB (ref 12.0–15.0)
LYMPHS PCT: 22 %
Lymphs Abs: 1.4 10*3/uL (ref 0.7–4.0)
MCH: 28.3 pg (ref 26.0–34.0)
MCHC: 31.8 g/dL (ref 30.0–36.0)
MCV: 89 fL (ref 78.0–100.0)
MONOS PCT: 8 %
Monocytes Absolute: 0.5 10*3/uL (ref 0.1–1.0)
NEUTROS ABS: 3.3 10*3/uL (ref 1.7–7.7)
NEUTROS PCT: 53 %
Platelets: 270 10*3/uL (ref 150–400)
RBC: 3.46 MIL/uL — ABNORMAL LOW (ref 3.87–5.11)
RDW: 17.6 % — ABNORMAL HIGH (ref 11.5–15.5)
WBC: 6.3 10*3/uL (ref 4.0–10.5)

## 2017-03-21 LAB — STREP PNEUMONIAE URINARY ANTIGEN: Strep Pneumo Urinary Antigen: NEGATIVE

## 2017-03-21 LAB — PROCALCITONIN

## 2017-03-21 LAB — MRSA PCR SCREENING: MRSA by PCR: NEGATIVE

## 2017-03-21 LAB — BRAIN NATRIURETIC PEPTIDE: B NATRIURETIC PEPTIDE 5: 91.4 pg/mL (ref 0.0–100.0)

## 2017-03-21 MED ORDER — ORAL CARE MOUTH RINSE
15.0000 mL | Freq: Two times a day (BID) | OROMUCOSAL | Status: DC
  Administered 2017-03-21 (×2): 15 mL via OROMUCOSAL

## 2017-03-21 MED ORDER — AZTREONAM IN DEXTROSE 2 GM/50ML IV SOLN
2.0000 g | Freq: Three times a day (TID) | INTRAVENOUS | Status: DC
  Administered 2017-03-21 – 2017-03-22 (×2): 2 g via INTRAVENOUS
  Filled 2017-03-21 (×3): qty 50

## 2017-03-21 MED ORDER — ALBUTEROL SULFATE (2.5 MG/3ML) 0.083% IN NEBU: 2.5000 mg | INHALATION_SOLUTION | RESPIRATORY_TRACT | Status: DC | PRN

## 2017-03-21 MED ORDER — METHYLPREDNISOLONE SODIUM SUCC 125 MG IJ SOLR
125.0000 mg | Freq: Once | INTRAMUSCULAR | Status: AC
  Administered 2017-03-21: 125 mg via INTRAVENOUS
  Filled 2017-03-21: qty 2

## 2017-03-21 MED ORDER — FERROUS SULFATE 325 (65 FE) MG PO TABS
325.0000 mg | ORAL_TABLET | Freq: Every day | ORAL | Status: DC
  Administered 2017-03-22: 325 mg via ORAL
  Filled 2017-03-21: qty 1

## 2017-03-21 MED ORDER — PREDNISONE 20 MG PO TABS
40.0000 mg | ORAL_TABLET | Freq: Every day | ORAL | Status: DC
  Administered 2017-03-22: 40 mg via ORAL
  Filled 2017-03-21: qty 2

## 2017-03-21 MED ORDER — CHLORHEXIDINE GLUCONATE 0.12 % MT SOLN
15.0000 mL | Freq: Two times a day (BID) | OROMUCOSAL | Status: DC
  Administered 2017-03-21 (×2): 15 mL via OROMUCOSAL
  Filled 2017-03-21 (×2): qty 15

## 2017-03-21 NOTE — Progress Notes (Signed)
NT ambulated patient this am to bathroom and back to bed x2. O2 sat stayed between 94-96 percent without oxygen. We can attempt hallway later when patient is less winded. Day shift made aware.Roderick Pee

## 2017-03-21 NOTE — Progress Notes (Signed)
Initial Nutrition Assessment  INTERVENTION:   Provide snacks daily Encourage PO intake RD to continue to monitor for needs  NUTRITION DIAGNOSIS:   Increased nutrient needs related to cancer and cancer related treatments as evidenced by estimated needs.  GOAL:   Patient will meet greater than or equal to 90% of their needs  MONITOR:   PO intake, Labs, Weight trends, I & O's  REASON FOR ASSESSMENT:   Consult Assessment of nutrition requirement/status  ASSESSMENT:   61 y.o. 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 On arrival she was dyspneic and hypoxic requiring 2L O2, with imaging showing diffuse left lung infiltrates superimposed on radiation pneumonitis with small pleural effusion. WBC 7.7, lactate 1.71, troponin negative, flu negative. She was admitted for HCAP treatment.   Patient in room with no family at bedside. Pt states she is in a lot of pain today. Pt states she had been eating well with good appetite up until 1 week PTA. Pt states she was unable to tolerate and keep any food down for 1 week. Pt states she would either vomit or have diarrhea. The only food she was able to keep down the whole week was a steak, baked potato and salad.  Pt does not like protein supplements. She is willing to have snacks provided between meals. RD to order.  Per chart review, pt has lost 5 lb since 2/13 (3% wt loss x 2 months, insignificant for time frame). Nutrition focused physical exam shows no sign of depletion of muscle mass or body fat.   Labs reviewed. Medications: Ferrous Sulfate tablet daily, Protonix tablet BID, K-DUR tablet daily, IV Zofran PRN  Diet Order:  Diet regular Room service appropriate? Yes; Fluid consistency:  Thin  Skin:  Reviewed, no issues  Last BM:  4/19  Height:   Ht Readings from Last 1 Encounters:  03/20/17 '5\' 4"'$  (1.626 m)    Weight:   Wt Readings from Last 1 Encounters:  03/20/17 169 lb 15.6 oz (77.1 kg)    Ideal Body Weight:  54.5 kg  BMI:  Body mass index is 29.18 kg/m.  Estimated Nutritional Needs:   Kcal:  1800-2000  Protein:  80-90g  Fluid:  2L/day  EDUCATION NEEDS:   Education needs addressed  Clayton Bibles, MS, RD, LDN Pager: [phone] After Hours Pager: [phone]

## 2017-03-21 NOTE — Progress Notes (Signed)
PROGRESS NOTE  Rebekah Henderson  JOA:[id number] DOB: January 22, 1956 DOA: 03/20/2017 PCP: Glo Herring, MD  Outpatient Specialists: Oncology, Dr. Julien Nordmann  Brief Narrative: Rebekah Henderson is a 61 y.o. 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 On arrival she was dyspneic and hypoxic requiring 2L O2, with imaging showing diffuse left lung infiltrates superimposed on radiation pneumonitis with small pleural effusion. WBC 7.7, lactate 1.71, troponin negative, flu negative. She was admitted for HCAP treatment.   Assessment & Plan: Principal Problem:   Acute on chronic respiratory failure with hypoxia (HCC) Active Problems:   Hypertension   Small cell lung cancer, left (HCC)   Antineoplastic chemotherapy induced anemia   HCAP (healthcare-associated pneumonia)   Mild protein-calorie malnutrition (HCC)  Acute on chronic respiratory failure: Due to acute COPD, possibly pneumonia (treated for HCAP) on baseline radiation pneumonitis.   - PCT negative, no fever, no leukocytosis. Strep pneumo Ag neg. Will check MRSA swab and monitor cultures and DC abx if negative 4/21. - Bronchodilators, will add steroids as she is wheezing. - Continue supplemental oxygen with goal SpO2 88-92%. - Check BNP  Small cell lung cancer: Stage T1b, N2, M0, Dx in left lower lobe Oct 2017 s/p systemic chemotherapy and concurrent radiation. - Dr. Julien Nordmann added to treatment team as Juluis Rainier. - Will need f/u chest CT after resolution to evaluate for recurrent mass, this was planned for June 2017 anyway for restaging. - There was consideration of prophylactic cranial irradiation though this hasn't been started.  Essential HTN: Chronic, stable -Continue home medications: norvasc,  metoprolol, maxzide  Malnutrition: Related to CA and its treatment. Undetermined severity. Albumin 3.0 - Nutrition consult  Normocytic anemia: Likely due to chemotherapy/chronic disease, has required transfusions in the past. No bleeding. Hgb 9.5 > 9.8, stable - Will restart iron started as outpatient.  Hypothyroidism: Normal TSH and free T4 in 1/18 - Continue synthroid at current dose  DVT prophylaxis: Lovenox Code Status: Full confirmed at admission Family Communication: None at bedside Disposition Plan: DC home when clinically improved.   Consultants: Dr. Julien Nordmann added as Juluis Rainier.  Procedures: None  Antimicrobials: Vancomycin, aztreonam 4/19 >>   Subjective: She reports stable dyspnea that is much worse than baseline, still wheezing. Chest pain is only with coughing, not worse with exertion. No orthopnea or leg swelling.   Objective: BP 104/61 (BP Location: Left Arm)   Pulse 65   Temp 97.5 F (36.4 C) (Oral)   Resp 18   Ht '5\' 4"'$  (1.626 m)   Wt 77.1 kg (169 lb 15.6 oz)   SpO2 100%   BMI 29.18 kg/m   General exam: Chronically ill-appearing female in no distress  Respiratory system: Non-labored breathing, mildly dyspneic with talking and has accessory muscle use on standing. Coarse breath sounds throughout left lung. Bilateral expiratory wheezing noted. Cardiovascular system: Regular rate and rhythm. No murmur, rub, or gallop. No JVD, and no pedal edema. Gastrointestinal system: Abdomen soft, non-tender, non-distended, with normoactive bowel sounds. No organomegaly or masses felt. Central nervous system: Alert and oriented. No focal neurological deficits. Extremities: Warm, no deformities Skin: No rashes, lesions no ulcers Psychiatry: Judgement and insight appear normal. Mood & affect appropriate.   Data Reviewed: I have personally reviewed following labs and imaging studies  CBC:  Recent Labs Lab 03/20/17 1610 03/21/17 0719  WBC 7.7 6.3  NEUTROABS 4.8 3.3  HGB  9.5* 9.8*  HCT 29.8* 30.8*  MCV 88.2 89.0  PLT 281 800   Basic Metabolic Panel:  Recent Labs Lab 03/20/17 1610 03/21/17 0356  NA 139 138  K 3.9 3.7  CL 104 104  CO2 26 25  GLUCOSE 89 91  BUN 5* 5*  CREATININE 0.60 0.54  CALCIUM 8.3* 8.0*   GFR: Estimated Creatinine Clearance: 72.4 mL/min (by C-G formula based on SCr of 0.54 mg/dL). Liver Function Tests:  Recent Labs Lab 03/20/17 1610  AST 14*  ALT 7*  ALKPHOS 43  BILITOT 0.7  PROT 6.3*  ALBUMIN 3.0*   No results for input(s): LIPASE, AMYLASE in the last 168 hours. No results for input(s): AMMONIA in the last 168 hours. Coagulation Profile: No results for input(s): INR, PROTIME in the last 168 hours. Cardiac Enzymes: No results for input(s): CKTOTAL, CKMB, CKMBINDEX, TROPONINI in the last 168 hours. BNP (last 3 results) No results for input(s): PROBNP in the last 8760 hours. HbA1C: No results for input(s): HGBA1C in the last 72 hours. CBG: No results for input(s): GLUCAP in the last 168 hours. Lipid Profile: No results for input(s): CHOL, HDL, LDLCALC, TRIG, CHOLHDL, LDLDIRECT in the last 72 hours. Thyroid Function Tests: No results for input(s): TSH, T4TOTAL, FREET4, T3FREE, THYROIDAB in the last 72 hours. Anemia Panel: No results for input(s): VITAMINB12, FOLATE, FERRITIN, TIBC, IRON, RETICCTPCT in the last 72 hours. Urine analysis:    Component Value Date/Time   COLORURINE YELLOW 03/03/2017 0745   APPEARANCEUR CLEAR 03/03/2017 0745   LABSPEC 1.006 03/03/2017 0745   PHURINE 7.0 03/03/2017 0745   GLUCOSEU NEGATIVE 03/03/2017 0745   HGBUR NEGATIVE 03/03/2017 0745   BILIRUBINUR NEGATIVE 03/03/2017 0745   KETONESUR NEGATIVE 03/03/2017 0745   PROTEINUR NEGATIVE 03/03/2017 0745   UROBILINOGEN 0.2 03/10/2014 1027   NITRITE NEGATIVE 03/03/2017 0745   LEUKOCYTESUR NEGATIVE 03/03/2017 0745   No results found for this or any previous visit (from the past 240 hour(s)).    Radiology Studies: Dg Chest 2  View  Result Date: 03/20/2017 CLINICAL DATA:  61 year old female with history of shortness of breath, wheezing and crackles in the chest for 1 week. History of lung cancer. EXAM: CHEST  2 VIEW COMPARISON:  Chest x-ray 12/07/2016.  Chest CT 03/03/2017. FINDINGS: Extensive architectural distortion and irregular opacities throughout the left mid to upper lung again noted, correlating to the new areas of airspace disease and septal thickening seen on the recent chest CT. Small left pleural effusion appears to be new. Right lung is clear. No right pleural effusion. No evidence of pulmonary edema. Heart size is normal. Upper mediastinal contours are distorted on the left related to underlying malignancy including AP window adenopathy, as well as evolving post treatment related changes. Plain screw fixation device in the lower cervical spine incidentally noted. IMPRESSION: 1. 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 2. New small left pleural effusion. Electronically Signed   By: Vinnie Langton M.D.   On: 03/20/2017 14:40   Ct Angio Chest Pe W And/or Wo Contrast  Result Date: 03/20/2017 CLINICAL DATA:  Acute onset of nausea, vomiting and diarrhea. Shortness of breath. Decreased O2 saturation. Subjective fever and chills, with substernal chest pressure. Productive cough. Initial encounter. EXAM: CT ANGIOGRAPHY CHEST WITH CONTRAST TECHNIQUE: Multidetector CT imaging of the chest was performed using the standard  protocol during bolus administration of intravenous contrast. Multiplanar CT image reconstructions and MIPs were obtained to evaluate the vascular anatomy. CONTRAST:  100 mL of Isovue 370 IV contrast COMPARISON:  CT of the chest performed 03/03/2017, and chest radiograph performed earlier today at 2:29 p.m. FINDINGS: Cardiovascular:  There is no evidence of pulmonary embolus. The heart is borderline enlarged. The  ascending thoracic aorta is borderline normal in diameter, measuring up to 3.9 cm. The great vessels are grossly unremarkable in appearance. 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 Mild surrounding soft tissue inflammation is seen. Trace pericardial fluid remains within normal limits. A small to moderate hiatal hernia is noted. The visualized portions of the thyroid gland are unremarkable. No axillary lymphadenopathy is seen, though the axilla are only partially imaged on this study. Lungs/Pleura: There is significantly worsened left central upper and lower lobe airspace opacification, and haziness involving the remainder of the left lung, concerning for diffuse pneumonia superimposed on the patient's radiation pneumonitis. An increasing small left pleural effusion is also seen. The right lung appears relatively clear. No pneumothorax is seen. Underlying recurrent mass cannot be excluded, but is not well assessed given left-sided airspace opacification. Upper Abdomen: The visualized portions of the liver and spleen are grossly unremarkable. The visualized portions of the pancreas, adrenal glands and kidneys are within normal limits. Musculoskeletal: No acute osseous abnormalities are identified. Cervical spinal fusion hardware is partially imaged. The visualized musculature is unremarkable in appearance. Review of the MIP images confirms the above findings. IMPRESSION: 1. No evidence of pulmonary embolus. 2. Significantly worsened left central upper and lower lobe airspace opacification, and haziness involving the remainder of the left lung, concerning for diffuse pneumonia superimposed on the patient's radiation pneumonitis. Increasing small left pleural effusion also seen. Underlying recurrent mass cannot be excluded, but is not well assessed given left-sided airspace opacification. 3. 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 4. Small to moderate hiatal hernia. 5. Borderline cardiomegaly. Electronically Signed   By: Garald Balding M.D.   On: 03/20/2017 18:20    Scheduled Meds: . amLODipine  5 mg Oral Daily  . chlorhexidine  15 mL Mouth Rinse BID  . enoxaparin (LOVENOX) injection  40 mg Subcutaneous Q24H  . FLUoxetine  40 mg Oral Daily  . levothyroxine  25 mcg Oral QAC breakfast  . mouth rinse  15 mL Mouth Rinse q12n4p  . metoprolol succinate  25 mg Oral Daily  . pantoprazole  40 mg Oral BID AC  . potassium chloride SA  40 mEq Oral Daily  . temazepam  30 mg Oral QHS  . triamterene-hydrochlorothiazide  1 tablet Oral Daily   Continuous Infusions: . sodium chloride 75 mL/hr at 03/21/17 0105  . aztreonam Stopped (03/21/17 0446)  . vancomycin Stopped (03/21/17 0900)     LOS: 1 day   Time spent: 25 minutes.  Vance Gather, MD [facility] Pager [phone]  If 7PM-7AM, please contact night-coverage [email or web address] Password [facility] 03/21/2017, 11:49 AM

## 2017-03-22 DIAGNOSIS — D6481 Anemia due to antineoplastic chemotherapy: Secondary | ICD-10-CM

## 2017-03-22 DIAGNOSIS — T451X5A Adverse effect of antineoplastic and immunosuppressive drugs, initial encounter: Secondary | ICD-10-CM

## 2017-03-22 LAB — HIV ANTIBODY (ROUTINE TESTING W REFLEX): HIV Screen 4th Generation wRfx: NONREACTIVE

## 2017-03-22 MED ORDER — PROMETHAZINE HCL 25 MG PO TABS: 12.5000 mg | ORAL_TABLET | Freq: Three times a day (TID) | ORAL | Status: DC | PRN

## 2017-03-22 MED ORDER — PROMETHAZINE HCL 25 MG/ML IJ SOLN
12.5000 mg | Freq: Four times a day (QID) | INTRAMUSCULAR | Status: DC | PRN
  Administered 2017-03-22 (×2): 12.5 mg via INTRAVENOUS
  Filled 2017-03-22 (×2): qty 1

## 2017-03-22 MED ORDER — ONDANSETRON 4 MG PO TBDP
8.0000 mg | ORAL_TABLET | Freq: Three times a day (TID) | ORAL | Status: DC | PRN
  Administered 2017-03-22: 8 mg via ORAL
  Filled 2017-03-22: qty 2

## 2017-03-22 MED ORDER — LEVOFLOXACIN 750 MG PO TABS: 750.0000 mg | ORAL_TABLET | Freq: Every day | ORAL | 0 refills | Status: DC

## 2017-03-22 MED ORDER — PREDNISONE 20 MG PO TABS: ORAL_TABLET | ORAL | 0 refills | Status: DC

## 2017-03-22 NOTE — Progress Notes (Signed)
Patient discharged to home with family, discharge instructions reviewed with patient who verbalized understanding. 

## 2017-03-22 NOTE — Discharge Summary (Signed)
Physician Discharge Summary  Rebekah Henderson HBZ:[id number] DOB: [birth year] DOA: 03/20/2017  PCP: Glo Herring, MD  Admit date: 03/20/2017 Discharge date: 03/22/2017  Admitted From: Home Disposition: Home   Recommendations for Outpatient Follow-up:  1. Follow up with PCP and oncology in 1-2 weeks 2. Will need f/u chest CT after resolution to evaluate for recurrent mass, this was planned for June 2017 anyway for restaging.  Home Health: None Equipment/Devices: 2L O2 Discharge Condition: Stable CODE STATUS: Full Diet recommendation: As tolerated  Brief/Interim Summary: Rebekah Henderson a 61 y.o.femalewith a history of limited stage (T1b, N2, M0) small cell lung cancer s/p 6 cycles of systemic chemotherapy, COPD on nocturnal O2, and anxiety who presented with 1 week of worsening dyspnea from baseline requiring 24hr oxygen, associated with pleuritic chest pain, nonproductive cough, and wheezing. On arrival she was dyspneic and hypoxic requiring 2L O2, with imaging showing diffuse left lung infiltrates superimposed on radiation pneumonitis with small pleural effusion. WBC 7.7, lactate 1.71, troponin negative, flu negative. She was admitted for HCAP treatment and improved with addition of steroids.   Discharge Diagnoses:  Principal Problem:   Acute on chronic respiratory failure with hypoxia (HCC) Active Problems:   Hypertension   Small cell lung cancer, left (HCC)   Antineoplastic chemotherapy induced anemia   HCAP (healthcare-associated pneumonia)   Mild protein-calorie malnutrition (HCC)  Acute on chronic respiratory failure: Due to acute COPD, possibly pneumonia (treated for HCAP) on baseline radiation pneumonitis.  BNP wnl. PCT negative, no fever, no leukocytosis. Strep pneumo Ag neg. - Improved, back near baseline and wants to go home. Will discharge on levaquin and steroids and bronchodilators.  Small cell lung cancer: Stage T1b, N2, M0, Dx in left lower lobe Oct 2017 s/p systemic  chemotherapy and concurrent radiation. - Dr. Julien Nordmann added to treatment team as Juluis Rainier. - Will need f/u chest CT after resolution to evaluate for recurrent mass, this was planned for June 2017 anyway for restaging. - There was consideration of prophylactic cranial irradiation though this hasn't been started.  Essential HTN: Chronic, stable - Continued home medications: norvasc, metoprolol, maxzide  Malnutrition: Related to CA and its treatment. Undetermined severity. Albumin 3.0 - Nutrition consulted  Normocytic anemia: Likely due to chemotherapy/chronic disease, has required transfusions in the past. No bleeding. Hgb 9.5 > 9.8, stable - Will restart iron started as outpatient.  Hypothyroidism: Normal TSH and free T4 in 1/18 - Continue synthroid at current dose  Discharge Instructions Discharge Instructions    Call MD for:  difficulty breathing, headache or visual disturbances    Complete by:  As directed    Call MD for:  persistant nausea and vomiting    Complete by:  As directed    Call MD for:  temperature >100.4    Complete by:  As directed    Discharge instructions    Complete by:  As directed    You were admitted for shortness of breath due to pneumonia and radiation pneumonitis. You have improved and are stable for discharge with the following recommendations:  - Continue taking prednisone, 2 tabs ('40mg'$ ) every morning for 5 days, then 1 tab ('20mg'$ ) by mouth every morning for 5 days.  - Continue levaquin for 6 days - Follow up with Dr. Julien Nordmann or seek medical attention if you are unable to take anything by mouth or shortness of breath worsens.     Allergies as of 03/22/2017      Reactions   Penicillins Hives, Swelling, Other (See  Comments)   Reaction:  Face/mouth swelling  Has patient had a PCN reaction causing immediate rash, facial/tongue/throat swelling, SOB or lightheadedness with hypotension: Yes Has patient had a PCN reaction causing severe rash involving mucus  membranes or skin necrosis: No Has patient had a PCN reaction that required hospitalization No Has patient had a PCN reaction occurring within the last 10 years: No If all of the above answers are "NO", then may proceed with Cephalosporin use.   Codeine Nausea Only   Ranitidine Hcl Other (See Comments)   Reaction:  Dizziness    Keflex [cephalexin] Other (See Comments)   Reaction:  Unknown    Lyrica [pregabalin] Other (See Comments)   Reaction:  Somnolence       Medication List    TAKE these medications   albuterol 108 (90 Base) MCG/ACT inhaler Commonly known as:  PROVENTIL HFA;VENTOLIN HFA Inhale 2 puffs into the lungs every 4 (four) hours as needed for wheezing or shortness of breath.   amLODipine 5 MG tablet Commonly known as:  NORVASC Take 5 mg by mouth daily.   dicyclomine 20 MG tablet Commonly known as:  BENTYL Take 20 mg by mouth 3 (three) times daily as needed for spasms.   estazolam 2 MG tablet Commonly known as:  PROSOM Take 2 mg by mouth at bedtime.   estradiol 2 MG tablet Commonly known as:  ESTRACE Take 2 mg by mouth daily.   FLUoxetine 40 MG capsule Commonly known as:  PROZAC Take 40 mg by mouth daily.   HYDROcodone-acetaminophen 5-325 MG tablet Commonly known as:  NORCO/VICODIN Take 1 tablet by mouth every 4 (four) hours as needed for moderate pain.   INTEGRA PLUS Caps Take 1 capsule by mouth every morning.   levofloxacin 750 MG tablet Commonly known as:  LEVAQUIN Take 1 tablet (750 mg total) by mouth daily.   levothyroxine 25 MCG tablet Commonly known as:  SYNTHROID, LEVOTHROID Take 25 mcg by mouth daily before breakfast.   loperamide 2 MG capsule Commonly known as:  IMODIUM Take 2 mg by mouth every 2 (two) hours as needed for diarrhea or loose stools.   metoprolol succinate 25 MG 24 hr tablet Commonly known as:  TOPROL-XL Take 25 mg by mouth daily.   multivitamin with minerals Tabs tablet Take 2 tablets by mouth daily.   ondansetron 4  MG disintegrating tablet Commonly known as:  ZOFRAN ODT Take 1 tablet (4 mg total) by mouth every 8 (eight) hours as needed for nausea or vomiting.   pantoprazole 40 MG tablet Commonly known as:  PROTONIX Take 40 mg by mouth 2 (two) times daily before a meal.   potassium chloride SA 20 MEQ tablet Commonly known as:  K-DUR,KLOR-CON Take 2 tablets (40 mEq total) by mouth daily.   predniSONE 20 MG tablet Commonly known as:  DELTASONE Take 2 tabs by mouth every morning for 5 days, then 1 tab daily   triamterene-hydrochlorothiazide 37.5-25 MG tablet Commonly known as:  MAXZIDE-25 Take 1 tablet by mouth daily.      Follow-up Information    Glo Herring, MD Follow up.   Specialty:  Internal Medicine Contact information: [address] [place] [address] [phone]        Eilleen Kempf., MD Follow up.   Specialty:  Oncology Contact information: [address] [place] [address] [phone]          Allergies  Allergen Reactions  . Penicillins Hives, Swelling and Other (See Comments)  Reaction:  Face/mouth swelling  Has patient had a PCN reaction causing immediate rash, facial/tongue/throat swelling, SOB or lightheadedness with hypotension: Yes Has patient had a PCN reaction causing severe rash involving mucus membranes or skin necrosis: No Has patient had a PCN reaction that required hospitalization No Has patient had a PCN reaction occurring within the last 10 years: No If all of the above answers are "NO", then may proceed with Cephalosporin use.  . Codeine Nausea Only  . Ranitidine Hcl Other (See Comments)    Reaction:  Dizziness   . Keflex [Cephalexin] Other (See Comments)    Reaction:  Unknown   . Lyrica [Pregabalin] Other (See Comments)    Reaction:  Somnolence     Consultations:  None  Procedures/Studies: Dg Chest 2 View  Result Date: 03/20/2017 CLINICAL DATA:  61 year old female with history of shortness of breath,  wheezing and crackles in the chest for 1 week. History of lung cancer. EXAM: CHEST  2 VIEW COMPARISON:  Chest x-ray 12/07/2016.  Chest CT 03/03/2017. FINDINGS: Extensive architectural distortion and irregular opacities throughout the left mid to upper lung again noted, correlating to the new areas of airspace disease and septal thickening seen on the recent chest CT. Small left pleural effusion appears to be new. Right lung is clear. No right pleural effusion. No evidence of pulmonary edema. Heart size is normal. Upper mediastinal contours are distorted on the left related to underlying malignancy including AP window adenopathy, as well as evolving post treatment related changes. Plain screw fixation device in the lower cervical spine incidentally noted. IMPRESSION: 1. 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 2. New small left pleural effusion. Electronically Signed   By: Vinnie Langton M.D.   On: 03/20/2017 14:40   Ct Chest W Contrast  Result Date: 03/03/2017 CLINICAL DATA:  Followup lung carcinoma. Ongoing chemotherapy. Recently completed radiation therapy. Left-sided abdominal and back pain, nausea and vomiting, diarrhea for 3 days. EXAM: CT CHEST, ABDOMEN, AND PELVIS WITH CONTRAST TECHNIQUE: Multidetector CT imaging of the chest, abdomen and pelvis was performed following the standard protocol during bolus administration of intravenous contrast. CONTRAST:  111m ISOVUE-300 IOPAMIDOL (ISOVUE-300) INJECTION 61% COMPARISON:  Chest CT on 01/06/2017 and PET-CT 09/27/2016 FINDINGS: CT CHEST FINDINGS Cardiovascular: No acute findings. Mediastinum/Lymph Nodes: Mediastinal lymphadenopathy in the AP window shows no significant change, measuring 3.3 x 3.2 cm on image 20/3. No new masses or lymphadenopathy identified. New airspace disease is seen within the left upper and lower lobes in a predominant paramediastinal  distribution which is suspicious for radiation pneumonitis, although pneumonia cannot be excluded. No discrete pulmonary nodules or masses are identified. Mild right lower lobe scarring is stable. Right lung is otherwise clear. No evidence of pleural effusion. Lungs/Pleura: No pulmonary infiltrate or mass identified. No effusion present. Musculoskeletal:  No suspicious bone lesions identified. CT ABDOMEN AND PELVIS FINDINGS Hepatobiliary: No masses identified. Mild hepatic steatosis. Prior cholecystectomy noted. No evidence of biliary dilatation. Pancreas:  No mass or inflammatory changes. Spleen:  Within normal limits in size and appearance. Adrenals/Urinary tract: Tiny low-attenuation lesion in upper pole of right kidney is too small to characterize. No definite masses or hydronephrosis. Stomach/Bowel: No evidence of obstruction, inflammatory process, or abnormal fluid collections. Colonic diverticulosis again noted, without evidence of diverticulitis. Vascular/Lymphatic: No pathologically enlarged lymph nodes identified. No abdominal aortic aneurysm. Aortic atherosclerosis. Reproductive: Prior hysterectomy noted. Adnexal regions are unremarkable in appearance. Other:  None. Musculoskeletal:  No suspicious bone lesions identified. IMPRESSION: Stable mediastinal lymphadenopathy in the AP window. No new or progressive metastatic disease identified. New left upper and lower lobe airspace disease and prominent paramediastinal distribution, highly suspicious for radiation pneumonitis although pneumonia cannot be excluded. No acute findings within the abdomen or pelvis. Electronically Signed   By: Earle Gell M.D.   On: 03/03/2017 09:32   Ct Angio Chest Pe W And/or Wo Contrast  Result Date: 03/20/2017 CLINICAL DATA:  Acute onset of nausea, vomiting and diarrhea. Shortness of breath. Decreased O2 saturation. Subjective fever and chills, with substernal chest pressure. Productive cough. Initial encounter. EXAM: CT  ANGIOGRAPHY CHEST WITH CONTRAST TECHNIQUE: Multidetector CT imaging of the chest was performed using the standard protocol during bolus administration of intravenous contrast. Multiplanar CT image reconstructions and MIPs were obtained to evaluate the vascular anatomy. CONTRAST:  100 mL of Isovue 370 IV contrast COMPARISON:  CT of the chest performed 03/03/2017, and chest radiograph performed earlier today at 2:29 p.m. FINDINGS: Cardiovascular:  There is no evidence of pulmonary embolus. The heart is borderline enlarged. The ascending thoracic aorta is borderline normal in diameter, measuring up to 3.9 cm. The great vessels are grossly unremarkable in appearance. 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 Mild surrounding soft tissue inflammation is seen. Trace pericardial fluid remains within normal limits. A small to moderate hiatal hernia is noted. The visualized portions of the thyroid gland are unremarkable. No axillary lymphadenopathy is seen, though the axilla are only partially imaged on this study. Lungs/Pleura: There is significantly worsened left central upper and lower lobe airspace opacification, and haziness involving the remainder of the left lung, concerning for diffuse pneumonia superimposed on the patient's radiation pneumonitis. An increasing small left pleural effusion is also seen. The right lung appears relatively clear. No pneumothorax is seen. Underlying recurrent mass cannot be excluded, but is not well assessed given left-sided airspace opacification. Upper Abdomen: The visualized portions of the liver and spleen are grossly unremarkable. The visualized portions of the pancreas, adrenal glands and kidneys are within normal limits. Musculoskeletal: No acute osseous abnormalities are identified. Cervical spinal fusion hardware is partially imaged. The visualized musculature is unremarkable in  appearance. Review of the MIP images confirms the above findings. IMPRESSION: 1. No evidence of pulmonary embolus. 2. Significantly worsened left central upper and lower lobe airspace opacification, and haziness involving the remainder of the left lung, concerning for diffuse pneumonia superimposed on the patient's radiation pneumonitis. Increasing small left pleural effusion also seen. Underlying recurrent mass cannot be excluded, but is not well assessed given left-sided airspace opacification. 3. 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 4. Small to moderate hiatal hernia. 5. Borderline cardiomegaly. Electronically Signed   By: Garald Balding M.D.   On: 03/20/2017 18:20   Ct Abdomen Pelvis W Contrast  Result Date: 03/03/2017 CLINICAL DATA:  Followup lung carcinoma. Ongoing chemotherapy. Recently completed radiation therapy. Left-sided abdominal and back pain, nausea and vomiting, diarrhea for 3 days. EXAM: CT CHEST, ABDOMEN, AND PELVIS WITH CONTRAST TECHNIQUE: Multidetector CT imaging of the chest, abdomen and pelvis was performed following the standard protocol during bolus administration of intravenous contrast. CONTRAST:  173m ISOVUE-300 IOPAMIDOL (ISOVUE-300) INJECTION 61% COMPARISON:  Chest CT on 01/06/2017 and PET-CT 09/27/2016 FINDINGS: CT CHEST FINDINGS Cardiovascular: No acute findings. Mediastinum/Lymph Nodes: Mediastinal lymphadenopathy in the AP window shows no significant change,  measuring 3.3 x 3.2 cm on image 20/3. No new masses or lymphadenopathy identified. New airspace disease is seen within the left upper and lower lobes in a predominant paramediastinal distribution which is suspicious for radiation pneumonitis, although pneumonia cannot be excluded. No discrete pulmonary nodules or masses are identified. Mild right lower lobe scarring is stable. Right lung is otherwise clear. No evidence  of pleural effusion. Lungs/Pleura: No pulmonary infiltrate or mass identified. No effusion present. Musculoskeletal:  No suspicious bone lesions identified. CT ABDOMEN AND PELVIS FINDINGS Hepatobiliary: No masses identified. Mild hepatic steatosis. Prior cholecystectomy noted. No evidence of biliary dilatation. Pancreas:  No mass or inflammatory changes. Spleen:  Within normal limits in size and appearance. Adrenals/Urinary tract: Tiny low-attenuation lesion in upper pole of right kidney is too small to characterize. No definite masses or hydronephrosis. Stomach/Bowel: No evidence of obstruction, inflammatory process, or abnormal fluid collections. Colonic diverticulosis again noted, without evidence of diverticulitis. Vascular/Lymphatic: No pathologically enlarged lymph nodes identified. No abdominal aortic aneurysm. Aortic atherosclerosis. Reproductive: Prior hysterectomy noted. Adnexal regions are unremarkable in appearance. Other:  None. Musculoskeletal:  No suspicious bone lesions identified. IMPRESSION: Stable mediastinal lymphadenopathy in the AP window. No new or progressive metastatic disease identified. New left upper and lower lobe airspace disease and prominent paramediastinal distribution, highly suspicious for radiation pneumonitis although pneumonia cannot be excluded. No acute findings within the abdomen or pelvis. Electronically Signed   By: Earle Gell M.D.   On: 03/03/2017 09:32    Subjective: Pt feels much better, able to walk to bathroom and back without dyspnea. Cough is breaking up. No chest pain. Eating very well, thinks steroids are helping appetite.   Discharge Exam: Vitals:   03/21/17 2024 03/22/17 0420  BP: 111/67 112/76  Pulse: 75 81  Resp: 14 16  Temp: 98 F (36.7 C) 98.4 F (36.9 C)   General: 61yo F in no distress Cardiovascular: RRR, S1/S2 +, no rubs, no gallops Respiratory: Coarse left-side expiratory rhonchi, clear on right. Nonlabored on 2L by [place]. Abdominal:  Soft, NT, ND, bowel sounds + Extremities: No edema, no cyanosis  Labs: BNP (last 3 results)  Recent Labs  03/21/17 0719  BNP 81.1   Basic Metabolic Panel:  Recent Labs Lab 03/20/17 1610 03/21/17 0356  NA 139 138  K 3.9 3.7  CL 104 104  CO2 26 25  GLUCOSE 89 91  BUN 5* 5*  CREATININE 0.60 0.54  CALCIUM 8.3* 8.0*   Liver Function Tests:  Recent Labs Lab 03/20/17 1610  AST 14*  ALT 7*  ALKPHOS 43  BILITOT 0.7  PROT 6.3*  ALBUMIN 3.0*   CBC:  Recent Labs Lab 03/20/17 1610 03/21/17 0719  WBC 7.7 6.3  NEUTROABS 4.8 3.3  HGB 9.5* 9.8*  HCT 29.8* 30.8*  MCV 88.2 89.0  PLT 281 270   Microbiology Recent Results (from the past 240 hour(s))  Culture, blood (routine x 2) Call MD if unable to obtain prior to antibiotics being given     Status: None (Preliminary result)   Collection Time: 03/20/17  8:27 PM  Result Value Ref Range Status   Specimen Description BLOOD RIGHT ARM  Final   Special Requests   Final    BOTTLES DRAWN AEROBIC AND ANAEROBIC Blood Culture adequate volume   Culture   Final    NO GROWTH 1 DAY Performed at [facility], [place] [address], [place], [place] [address]    Report Status PENDING  Incomplete  MRSA PCR Screening  Status: None   Collection Time: 03/21/17  2:19 PM  Result Value Ref Range Status   MRSA by PCR NEGATIVE NEGATIVE Final    Comment:        The GeneXpert MRSA Assay (FDA approved for NASAL specimens only), is one component of a comprehensive MRSA colonization surveillance program. It is not intended to diagnose MRSA infection nor to guide or monitor treatment for MRSA infections.     Time coordinating discharge: Approximately 40 minutes  Vance Gather, MD  [facility] 03/22/2017, 1:25 PM Pager [phone]

## 2017-03-24 ENCOUNTER — Telehealth: Payer: Self-pay | Admitting: Medical Oncology

## 2017-03-24 NOTE — Telephone Encounter (Signed)
discharged from hospital after treatment for pneumonia. Was told to f/u with [facility]. Ct angio done. IMPRESSION: 1. No evidence of pulmonary embolus. 2. Significantly worsened left central upper and lower lobe airspace opacification, and haziness involving the remainder of the left lung, concerning for diffuse pneumonia superimposed on the patient's radiation pneumonitis. Increasing small left pleural effusion also seen. Underlying recurrent mass cannot be excluded, but is not well assessed given left-sided airspace opacification. 3. 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 4. Small to moderate hiatal hernia. 5. Borderline cardiomegaly.

## 2017-03-25 NOTE — Telephone Encounter (Signed)
This is probably radiation pneumonitis. We will need to monitor it closely and if it gets any worse, she may need to start long course of steroids.

## 2017-03-26 LAB — CULTURE, BLOOD (ROUTINE X 2)
Culture: NO GROWTH
Special Requests: ADEQUATE

## 2017-03-27 ENCOUNTER — Other Ambulatory Visit: Payer: Self-pay | Admitting: Medical Oncology

## 2017-03-27 ENCOUNTER — Telehealth: Payer: Self-pay | Admitting: Medical Oncology

## 2017-03-27 LAB — CULTURE, BLOOD (ROUTINE X 2)
CULTURE: NO GROWTH
Special Requests: ADEQUATE

## 2017-03-27 NOTE — Telephone Encounter (Signed)
Pt states she is doing better. She is finishing antibiotics . I told her she does not need CT scan next week and I cancelled it. I told her to keep brain scan. Pt stated she wants to do radiation in [place].

## 2017-03-28 DIAGNOSIS — J449 Chronic obstructive pulmonary disease, unspecified: Secondary | ICD-10-CM | POA: Diagnosis not present

## 2017-03-28 DIAGNOSIS — J189 Pneumonia, unspecified organism: Secondary | ICD-10-CM | POA: Diagnosis not present

## 2017-03-28 DIAGNOSIS — K219 Gastro-esophageal reflux disease without esophagitis: Secondary | ICD-10-CM | POA: Diagnosis not present

## 2017-03-28 DIAGNOSIS — Z6829 Body mass index (BMI) 29.0-29.9, adult: Secondary | ICD-10-CM | POA: Diagnosis not present

## 2017-03-31 DIAGNOSIS — J449 Chronic obstructive pulmonary disease, unspecified: Secondary | ICD-10-CM | POA: Diagnosis not present

## 2017-03-31 DIAGNOSIS — R0902 Hypoxemia: Secondary | ICD-10-CM | POA: Diagnosis not present

## 2017-04-01 ENCOUNTER — Ambulatory Visit (HOSPITAL_COMMUNITY): Payer: Medicare Other

## 2017-04-02 ENCOUNTER — Ambulatory Visit (HOSPITAL_COMMUNITY)
Admission: RE | Admit: 2017-04-02 | Discharge: 2017-04-02 | Disposition: A | Discharge status: Home / Self Care | Payer: Medicare Other | Source: Ambulatory Visit | Attending: Internal Medicine | Admitting: Internal Medicine

## 2017-04-02 DIAGNOSIS — C3490 Malignant neoplasm of unspecified part of unspecified bronchus or lung: Secondary | ICD-10-CM | POA: Diagnosis not present

## 2017-04-02 DIAGNOSIS — C3492 Malignant neoplasm of unspecified part of left bronchus or lung: Secondary | ICD-10-CM

## 2017-04-02 MED ORDER — GADOBENATE DIMEGLUMINE 529 MG/ML IV SOLN
15.0000 mL | Freq: Once | INTRAVENOUS | Status: AC | PRN
  Administered 2017-04-02: 15 mL via INTRAVENOUS

## 2017-04-04 ENCOUNTER — Encounter: Payer: Self-pay | Admitting: *Deleted

## 2017-04-04 ENCOUNTER — Telehealth: Payer: Self-pay

## 2017-04-04 NOTE — Progress Notes (Signed)
Oncology Nurse Navigator Documentation  Oncology Nurse Navigator Flowsheets 04/04/2017  Navigator Location [facility]  Navigator Encounter Type Other/I called Rad Onc [facility] in [place] to follow up on Ms. Coffel's appt for PCI yesterday.  I received a call today stating they have tried to contact Ms. Gordner several time and she has not answered the phone.  Ms. Strohecker was reached today and according to [place] would like an update on her MRI.  I called Rad ONc and updated them on scan. I also asked if she needed my help to call me back.   Treatment Phase Follow-up  Barriers/Navigation Needs Coordination of Care  Interventions Coordination of Care  Coordination of Care Appts  Acuity Level 2  Time Spent with Patient 30

## 2017-04-04 NOTE — Telephone Encounter (Signed)
s/w pt and gave her results of MRI - negative.

## 2017-04-09 DIAGNOSIS — C7931 Secondary malignant neoplasm of brain: Secondary | ICD-10-CM | POA: Diagnosis not present

## 2017-04-10 DIAGNOSIS — C7931 Secondary malignant neoplasm of brain: Secondary | ICD-10-CM | POA: Diagnosis not present

## 2017-04-11 ENCOUNTER — Telehealth: Payer: Self-pay | Admitting: *Deleted

## 2017-04-11 DIAGNOSIS — C3492 Malignant neoplasm of unspecified part of left bronchus or lung: Secondary | ICD-10-CM

## 2017-04-11 DIAGNOSIS — C7931 Secondary malignant neoplasm of brain: Secondary | ICD-10-CM | POA: Diagnosis not present

## 2017-04-11 MED ORDER — METHYLPREDNISOLONE 4 MG PO TBPK: ORAL_TABLET | ORAL | 0 refills | Status: DC

## 2017-04-11 NOTE — Telephone Encounter (Addendum)
"  I was discharged April 21 st with Pneumonia in the left lung where cancer is.  I took levofloxacin and prednisone at home for ten days.  He asked if I needed more steroid.  I believe I do if this is okay to take as I begin radiation next week.  I was better after discharge but now for the past four to five days getting worse each day.  I have a nonproductive cough and wheezing that's getting worse.  I have COPD.  Wear oxygen at night.  My pulse oxygenation = 88 %, I placed my oxygen on and it took a long time to get up to 96%.  I'm using my albuterol inhaler but it's not helping.  Practicing breathing exercises but have pain in chest with deep breaths.  Trying to drink to stay hydrated but I have nausea at times.  My forehead thermometer reads 87 degrees and won't move but I wake up at night sweating so must have a temperature.  I'm staying inside.  Return number [phone]."    Will notify POD partner for any further instructions.  Nurse instructions to continue forcing fluids.  Perhaps a humidifier or warm shower.  If feeling poor, report to nearest ED.

## 2017-04-11 NOTE — Telephone Encounter (Signed)
[place] on-call provider with patient call information.  Verbal order received and read back from [place] for Medrol dose pack.  Order given to patient  to verify pharmacy and sent to pharmacy at this time.

## 2017-04-15 DIAGNOSIS — C7931 Secondary malignant neoplasm of brain: Secondary | ICD-10-CM | POA: Diagnosis not present

## 2017-04-16 DIAGNOSIS — C7931 Secondary malignant neoplasm of brain: Secondary | ICD-10-CM | POA: Diagnosis not present

## 2017-04-23 DIAGNOSIS — C7931 Secondary malignant neoplasm of brain: Secondary | ICD-10-CM | POA: Diagnosis not present

## 2017-05-01 ENCOUNTER — Telehealth: Payer: Self-pay | Admitting: Internal Medicine

## 2017-05-01 DIAGNOSIS — J449 Chronic obstructive pulmonary disease, unspecified: Secondary | ICD-10-CM | POA: Diagnosis not present

## 2017-05-01 DIAGNOSIS — R0902 Hypoxemia: Secondary | ICD-10-CM | POA: Diagnosis not present

## 2017-05-01 NOTE — Telephone Encounter (Signed)
Patient would like to have labs done at [facility] and would like an order put in for labs to be done at [facility]

## 2017-05-02 ENCOUNTER — Other Ambulatory Visit: Payer: Self-pay | Admitting: Medical Oncology

## 2017-05-02 DIAGNOSIS — C3492 Malignant neoplasm of unspecified part of left bronchus or lung: Secondary | ICD-10-CM

## 2017-05-02 NOTE — Telephone Encounter (Signed)
I told pt we will do labs on wed .

## 2017-05-05 ENCOUNTER — Other Ambulatory Visit: Payer: Medicare Other

## 2017-05-06 ENCOUNTER — Other Ambulatory Visit: Payer: Self-pay | Admitting: Medical Oncology

## 2017-05-06 DIAGNOSIS — C3492 Malignant neoplasm of unspecified part of left bronchus or lung: Secondary | ICD-10-CM

## 2017-05-07 ENCOUNTER — Other Ambulatory Visit (HOSPITAL_BASED_OUTPATIENT_CLINIC_OR_DEPARTMENT_OTHER): Payer: Medicare Other

## 2017-05-07 ENCOUNTER — Ambulatory Visit (HOSPITAL_BASED_OUTPATIENT_CLINIC_OR_DEPARTMENT_OTHER): Payer: Medicare Other | Admitting: Internal Medicine

## 2017-05-07 ENCOUNTER — Telehealth: Payer: Self-pay | Admitting: Internal Medicine

## 2017-05-07 ENCOUNTER — Encounter: Payer: Self-pay | Admitting: Internal Medicine

## 2017-05-07 VITALS — BP 118/68 | HR 72 | Temp 98.1°F | Resp 20 | Ht 64.0 in | Wt 171.6 lb

## 2017-05-07 DIAGNOSIS — C3432 Malignant neoplasm of lower lobe, left bronchus or lung: Secondary | ICD-10-CM | POA: Diagnosis not present

## 2017-05-07 DIAGNOSIS — I10 Essential (primary) hypertension: Secondary | ICD-10-CM | POA: Diagnosis not present

## 2017-05-07 DIAGNOSIS — C3492 Malignant neoplasm of unspecified part of left bronchus or lung: Secondary | ICD-10-CM

## 2017-05-07 DIAGNOSIS — M797 Fibromyalgia: Secondary | ICD-10-CM | POA: Diagnosis not present

## 2017-05-07 DIAGNOSIS — J449 Chronic obstructive pulmonary disease, unspecified: Secondary | ICD-10-CM

## 2017-05-07 LAB — CBC WITH DIFFERENTIAL/PLATELET
BASO%: 0.4 % (ref 0.0–2.0)
BASOS ABS: 0 10*3/uL (ref 0.0–0.1)
EOS%: 4.3 % (ref 0.0–7.0)
Eosinophils Absolute: 0.2 10*3/uL (ref 0.0–0.5)
HCT: 36 % (ref 34.8–46.6)
HGB: 12.2 g/dL (ref 11.6–15.9)
LYMPH%: 23.4 % (ref 14.0–49.7)
MCH: 32 pg (ref 25.1–34.0)
MCHC: 34 g/dL (ref 31.5–36.0)
MCV: 94.2 fL (ref 79.5–101.0)
MONO#: 0.5 10*3/uL (ref 0.1–0.9)
MONO%: 11.6 % (ref 0.0–14.0)
NEUT#: 2.6 10*3/uL (ref 1.5–6.5)
NEUT%: 60.3 % (ref 38.4–76.8)
Platelets: 223 10*3/uL (ref 145–400)
RBC: 3.82 10*6/uL (ref 3.70–5.45)
RDW: 19 % — ABNORMAL HIGH (ref 11.2–14.5)
WBC: 4.3 10*3/uL (ref 3.9–10.3)
lymph#: 1 10*3/uL (ref 0.9–3.3)

## 2017-05-07 LAB — COMPREHENSIVE METABOLIC PANEL
ALT: 8 U/L (ref 0–55)
AST: 14 U/L (ref 5–34)
Albumin: 3.4 g/dL — ABNORMAL LOW (ref 3.5–5.0)
Alkaline Phosphatase: 45 U/L (ref 40–150)
Anion Gap: 10 mEq/L (ref 3–11)
BUN: 8.6 mg/dL (ref 7.0–26.0)
CALCIUM: 9.3 mg/dL (ref 8.4–10.4)
CHLORIDE: 101 meq/L (ref 98–109)
CO2: 31 mEq/L — ABNORMAL HIGH (ref 22–29)
Creatinine: 0.7 mg/dL (ref 0.6–1.1)
EGFR: 89 mL/min/{1.73_m2} — ABNORMAL LOW (ref >90)
Glucose: 83 mg/dl (ref 70–140)
POTASSIUM: 4 meq/L (ref 3.5–5.1)
SODIUM: 142 meq/L (ref 136–145)
Total Bilirubin: 0.5 mg/dL (ref 0.20–1.20)
Total Protein: 6.8 g/dL (ref 6.4–8.3)

## 2017-05-07 LAB — MAGNESIUM: MAGNESIUM: 1.3 mg/dL — AB (ref 1.5–2.5)

## 2017-05-07 MED ORDER — MAGNESIUM OXIDE 400 (241.3 MG) MG PO TABS: 400.0000 mg | ORAL_TABLET | Freq: Two times a day (BID) | ORAL | 0 refills | Status: DC

## 2017-05-07 NOTE — Progress Notes (Signed)
[facility] Telephone:([phone]) [phone]   Fax:([phone]) [phone]  OFFICE PROGRESS NOTE  [employer], MD [address] [place] [address]  DIAGNOSIS: Limited stage (T1b, N2, M0) small cell lung cancer presented with left lower lobe/infrahilar mass and large mediastinal lymphadenopathy diagnosed in October 2017.  PRIOR THERAPY: 1) Systemic chemotherapy with cisplatin 60 MG/M2 on day 1 and etoposide 120 MG/M2 on days 1, 2 and 3 status post 1 cycle. This was discontinued secondary to intolerance. 2) Systemic chemotherapy with carboplatin for AUC of 4 on day 1 and etoposide 100 MG/M2 on days 1, 2 and 3 with Neulasta support on day 4 every 3 weeks. Status post 5 cycles. This was concurrent with radiation in [place], [place]. 3) prophylactic cranial irradiation.  CURRENT THERAPY: None.  INTERVAL HISTORY: Rebekah Henderson 61 y.o. female returns to the clinic today for follow-up visit. The patient is feeling fine today with no specific complaints. She developed some skin rash and itching on the front of her head and ears after the prophylactic cranial irradiation. She is currently using hydrocortisone cream for the rash. She denied having any chest pain but has shortness breath with exertion was no cough or hemoptysis. She denied having any fever or chills. She has no nausea, vomiting, diarrhea or constipation. The patient denied having any weight loss or night sweats. She is here today for evaluation and repeat blood work.   MEDICAL HISTORY: Past Medical History:  Diagnosis Date  . 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 Anxiety    takes Prozac daily  . Arthritis   . Colon polyps   . COPD (chronic obstructive pulmonary disease) ([place])   . Dehydration 03/06/2017  . Diverticulitis   . Dyspnea    with exertion  . Encounter for antineoplastic chemotherapy 12/03/2016  . Fibromyalgia   . GERD (gastroesophageal reflux disease)    takes Pantoprazole daily  .  Hypertension    takes Metoprolol,Triamterene-HCTZ,and Amlodipine daily  . Hypothyroidism    takes Synthroid daily  . IBS (irritable bowel syndrome)   . lung ca dx'd 10/02/2016   skin, lung  . PONV (postoperative nausea and vomiting)    pt also states that she had some difficulty breathing after cervical fusion    ALLERGIES:  is allergic to penicillins; codeine; ranitidine hcl; keflex [cephalexin]; and lyrica [pregabalin].  MEDICATIONS:  Current Outpatient Prescriptions  Medication Sig Dispense Refill  . albuterol (PROVENTIL HFA;VENTOLIN HFA) 108 (90 Base) MCG/ACT inhaler Inhale 2 puffs into the lungs every 4 (four) hours as needed for wheezing or shortness of breath.    Marland Kitchen amLODipine (NORVASC) 5 MG tablet Take 5 mg by mouth daily.      Marland Kitchen dicyclomine (BENTYL) 20 MG tablet Take 20 mg by mouth 3 (three) times daily as needed for spasms.    Marland Kitchen estazolam (PROSOM) 2 MG tablet Take 2 mg by mouth at bedtime.    Marland Kitchen estradiol (ESTRACE) 2 MG tablet Take 2 mg by mouth daily.      Marland Kitchen FeFum-FePoly-FA-B Cmp-C-Biot (INTEGRA PLUS) CAPS Take 1 capsule by mouth every morning. 30 capsule 1  . FLUoxetine (PROZAC) 40 MG capsule Take 40 mg by mouth daily.   2  . HYDROcodone-acetaminophen (NORCO/VICODIN) 5-325 MG tablet Take 1 tablet by mouth every 4 (four) hours as needed for moderate pain.   0  . levofloxacin (LEVAQUIN) 750 MG tablet Take 1 tablet (750 mg total) by mouth daily. 6 tablet 0  . levothyroxine (SYNTHROID, LEVOTHROID)  25 MCG tablet Take 25 mcg by mouth daily before breakfast.     . loperamide (IMODIUM) 2 MG capsule Take 2 mg by mouth every 2 (two) hours as needed for diarrhea or loose stools.    . methylPREDNISolone (MEDROL DOSEPAK) 4 MG TBPK tablet Follow tapering instructions per dose pack. 21 tablet 0  . metoprolol succinate (TOPROL-XL) 25 MG 24 hr tablet Take 25 mg by mouth daily.    . Multiple Vitamin (MULTIVITAMIN WITH MINERALS) TABS tablet Take 2 tablets by mouth daily.    . ondansetron (ZOFRAN  ODT) 4 MG disintegrating tablet Take 1 tablet (4 mg total) by mouth every 8 (eight) hours as needed for nausea or vomiting. 20 tablet 0  . pantoprazole (PROTONIX) 40 MG tablet Take 40 mg by mouth 2 (two) times daily before a meal.   10  . potassium chloride SA (K-DUR,KLOR-CON) 20 MEQ tablet Take 2 tablets (40 mEq total) by mouth daily. 60 tablet 1  . predniSONE (DELTASONE) 20 MG tablet Take 2 tabs by mouth every morning for 5 days, then 1 tab daily 15 tablet 0  . triamterene-hydrochlorothiazide (MAXZIDE-25) 37.5-25 MG tablet Take 1 tablet by mouth daily.     No current facility-administered medications for this visit.     SURGICAL HISTORY:  Past Surgical History:  Procedure Laterality Date  . BIOPSY N/A 05/25/2013   Procedure: BIOPSIES (Random Colon; Duodenal; Gastric);  Surgeon: Danie Binder, MD;  Location: [facility];  Service: Endoscopy;  Laterality: N/A;  . BLADDER SUSPENSION    . BREAST ENHANCEMENT SURGERY    . BREAST IMPLANT REMOVAL    . CERVICAL FUSION  AUG 2013  . CHOLECYSTECTOMY  1999  . COLONOSCOPY  2007 [place]   POLYPS  . COLONOSCOPY WITH PROPOFOL N/A 05/25/2013   Procedure: COLONOSCOPY WITH PROPOFOL(at cecum 0957) total withdrawal time=41min);  Surgeon: Danie Binder, MD;  Location: [facility];  Service: Endoscopy;  Laterality: N/A;  . ESOPHAGOGASTRODUODENOSCOPY (EGD) WITH PROPOFOL N/A 05/25/2013   Procedure: ESOPHAGOGASTRODUODENOSCOPY (EGD) WITH PROPOFOL;  Surgeon: Danie Binder, MD;  Location: [facility];  Service: Endoscopy;  Laterality: N/A;  . FOOT SURGERY    . POLYPECTOMY N/A 05/25/2013   Procedure: POLYPECTOMY (Rectal and Gastric);  Surgeon: Danie Binder, MD;  Location: [facility];  Service: Endoscopy;  Laterality: N/A;  . TONSILLECTOMY    . UPPER GASTROINTESTINAL ENDOSCOPY    . VIDEO BRONCHOSCOPY WITH ENDOBRONCHIAL ULTRASOUND  09/12/2016   Procedure: VIDEO BRONCHOSCOPY WITH ENDOBRONCHIAL ULTRASOUND AND BIOPSY;  Surgeon: Juanito Doom, MD;  Location: [facility];  Service:  Cardiopulmonary;;    REVIEW OF SYSTEMS:  A comprehensive review of systems was negative except for: Respiratory: positive for dyspnea on exertion Integument/breast: positive for rash   PHYSICAL EXAMINATION: General appearance: alert, cooperative and no distress Head: Normocephalic, without obvious abnormality, atraumatic Neck: no adenopathy, no JVD, supple, symmetrical, trachea midline and thyroid not enlarged, symmetric, no tenderness/mass/nodules Lymph nodes: Cervical, supraclavicular, and axillary nodes normal. Resp: clear to auscultation bilaterally Back: symmetric, no curvature. ROM normal. No CVA tenderness. Cardio: regular rate and rhythm, S1, S2 normal, no murmur, click, rub or gallop GI: soft, non-tender; bowel sounds normal; no masses,  no organomegaly Extremities: extremities normal, atraumatic, no cyanosis or edema   ECOG PERFORMANCE STATUS: 1 - Symptomatic but completely ambulatory  Blood pressure 118/68, pulse 72, temperature 98.1 F (36.7 C), temperature source Oral, resp. rate 20, height 5\' 4"  (1.626 m), weight 171 lb 9.6 oz (77.8 kg), SpO2 90 %.  LABORATORY DATA: Lab Results  Component Value Date   WBC 4.3 05/07/2017   HGB 12.2 05/07/2017   HCT 36.0 05/07/2017   MCV 94.2 05/07/2017   PLT 223 05/07/2017      Chemistry      Component Value Date/Time   NA 138 03/21/2017 0356   NA 139 02/03/2017 1130   K 3.7 03/21/2017 0356   K 4.2 02/03/2017 1130   CL 104 03/21/2017 0356   CO2 25 03/21/2017 0356   CO2 25 02/03/2017 1130   BUN 5 (L) 03/21/2017 0356   BUN 11.2 02/03/2017 1130   CREATININE 0.54 03/21/2017 0356   CREATININE 0.7 02/03/2017 1130      Component Value Date/Time   CALCIUM 8.0 (L) 03/21/2017 0356   CALCIUM 9.7 02/03/2017 1130   ALKPHOS 43 03/20/2017 1610   ALKPHOS 54 02/03/2017 1130   AST 14 (L) 03/20/2017 1610   AST 18 02/03/2017 1130   ALT 7 (L) 03/20/2017 1610   ALT 9 02/03/2017 1130   BILITOT 0.7 03/20/2017 1610   BILITOT 0.73  02/03/2017 1130       RADIOGRAPHIC STUDIES: No results found.  ASSESSMENT AND PLAN:  This is a very pleasant 61 years old white female with limited stage small cell lung cancer status post 6 cycles of systemic chemotherapy with carboplatin and etoposide concurrent with radiotherapy and followed by prophylactic cranial irradiation. The patient is feeling much better today with no specific complaints except for mild skin rash on the frontal area of her head. I recommended for the patient to continue on observation for now. I will see her back for follow-up visit in one month's for evaluation after repeating CT scan of the chest for restaging of her disease. The patient was advised to call immediately if she has any concerning symptoms in the interval. The patient voices understanding of current disease status and treatment options and is in agreement with the current care plan. All questions were answered. The patient knows to call the clinic with any problems, questions or concerns. We can certainly see the patient much sooner if necessary. I spent 10 minutes counseling the patient face to face. The total time spent in the appointment was 15 minutes.  Disclaimer: This note was dictated with voice recognition software. Similar sounding words can inadvertently be transcribed and may not be corrected upon review.

## 2017-05-07 NOTE — Telephone Encounter (Signed)
Gave patient AVS and calender per 6/6 los. [facility] to contact patient with Ct.

## 2017-05-07 NOTE — Addendum Note (Signed)
Addended by: Ardeen Garland on: 05/07/2017 02:56 PM   Modules accepted: Orders

## 2017-05-07 NOTE — Telephone Encounter (Signed)
I left message for pt to pick up magnesium rx due to low Mg+.

## 2017-05-14 ENCOUNTER — Telehealth: Payer: Self-pay

## 2017-05-14 NOTE — Telephone Encounter (Signed)
Pt asking if Dr Julien Nordmann wants her to continue integra plus. She will need refill if so.

## 2017-05-16 ENCOUNTER — Other Ambulatory Visit: Payer: Self-pay | Admitting: *Deleted

## 2017-05-16 DIAGNOSIS — R112 Nausea with vomiting, unspecified: Secondary | ICD-10-CM

## 2017-05-16 MED ORDER — ONDANSETRON 4 MG PO TBDP: 4.0000 mg | ORAL_TABLET | Freq: Three times a day (TID) | ORAL | 0 refills | Status: DC | PRN

## 2017-05-16 MED ORDER — INTEGRA PLUS PO CAPS: 1.0000 | ORAL_CAPSULE | Freq: Every morning | ORAL | 1 refills | Status: DC

## 2017-05-16 NOTE — Telephone Encounter (Signed)
Spoke with pt this am, pt is having some nausea. Rx refill for Integra and Zofran sent to pharmacy.

## 2017-05-16 NOTE — Addendum Note (Signed)
Addended by: Lucile Crater on: 05/16/2017 09:03 AM   Modules accepted: Orders

## 2017-05-22 ENCOUNTER — Telehealth: Payer: Self-pay

## 2017-05-22 NOTE — Telephone Encounter (Signed)
Pt has been vomiting but worse in last 2 days, cannot even keep medicine down. Vomited or heaved about 10 times today. Just coming up foam and bile. zofran 4 mg ODT helps maybe a little bit.  Pt has been throwing up every day since finishing cranial radiation in [place] on 5/30.   No fever, does have anxiety chest pain on left side. Is constipated last good BM was Friday, yesterday integra made her vomit, she tasted blood but no red in vomitus, magnesium made her vomit as well. She does leak a stool smear when she vomits.   S/w Dr Julien Nordmann and sent pt to [facility] ER for acute abdomen work up. Pt was agreeable.

## 2017-05-27 ENCOUNTER — Inpatient Hospital Stay (HOSPITAL_COMMUNITY)
Admission: EM | Admit: 2017-05-27 | Discharge: 2017-05-29 | DRG: 193 | Disposition: A | Discharge status: Home / Self Care | Payer: Medicare Other | Attending: Internal Medicine | Admitting: Internal Medicine

## 2017-05-27 ENCOUNTER — Emergency Department (HOSPITAL_COMMUNITY): Payer: Medicare Other

## 2017-05-27 ENCOUNTER — Encounter (HOSPITAL_COMMUNITY): Payer: Self-pay | Admitting: Emergency Medicine

## 2017-05-27 DIAGNOSIS — M797 Fibromyalgia: Secondary | ICD-10-CM | POA: Diagnosis present

## 2017-05-27 DIAGNOSIS — F419 Anxiety disorder, unspecified: Secondary | ICD-10-CM | POA: Diagnosis present

## 2017-05-27 DIAGNOSIS — J441 Chronic obstructive pulmonary disease with (acute) exacerbation: Secondary | ICD-10-CM | POA: Diagnosis not present

## 2017-05-27 DIAGNOSIS — J9621 Acute and chronic respiratory failure with hypoxia: Secondary | ICD-10-CM | POA: Diagnosis not present

## 2017-05-27 DIAGNOSIS — Z9049 Acquired absence of other specified parts of digestive tract: Secondary | ICD-10-CM

## 2017-05-27 DIAGNOSIS — K219 Gastro-esophageal reflux disease without esophagitis: Secondary | ICD-10-CM | POA: Diagnosis present

## 2017-05-27 DIAGNOSIS — M25512 Pain in left shoulder: Secondary | ICD-10-CM | POA: Diagnosis not present

## 2017-05-27 DIAGNOSIS — E860 Dehydration: Secondary | ICD-10-CM | POA: Diagnosis not present

## 2017-05-27 DIAGNOSIS — Z833 Family history of diabetes mellitus: Secondary | ICD-10-CM

## 2017-05-27 DIAGNOSIS — Z801 Family history of malignant neoplasm of trachea, bronchus and lung: Secondary | ICD-10-CM | POA: Diagnosis not present

## 2017-05-27 DIAGNOSIS — Z8249 Family history of ischemic heart disease and other diseases of the circulatory system: Secondary | ICD-10-CM | POA: Diagnosis not present

## 2017-05-27 DIAGNOSIS — C7931 Secondary malignant neoplasm of brain: Secondary | ICD-10-CM | POA: Diagnosis present

## 2017-05-27 DIAGNOSIS — Z86010 Personal history of colonic polyps: Secondary | ICD-10-CM | POA: Diagnosis not present

## 2017-05-27 DIAGNOSIS — J440 Chronic obstructive pulmonary disease with acute lower respiratory infection: Secondary | ICD-10-CM | POA: Diagnosis present

## 2017-05-27 DIAGNOSIS — J189 Pneumonia, unspecified organism: Principal | ICD-10-CM | POA: Diagnosis present

## 2017-05-27 DIAGNOSIS — K580 Irritable bowel syndrome with diarrhea: Secondary | ICD-10-CM | POA: Diagnosis present

## 2017-05-27 DIAGNOSIS — R112 Nausea with vomiting, unspecified: Secondary | ICD-10-CM

## 2017-05-27 DIAGNOSIS — Z888 Allergy status to other drugs, medicaments and biological substances status: Secondary | ICD-10-CM | POA: Diagnosis not present

## 2017-05-27 DIAGNOSIS — C3490 Malignant neoplasm of unspecified part of unspecified bronchus or lung: Secondary | ICD-10-CM | POA: Diagnosis not present

## 2017-05-27 DIAGNOSIS — R0602 Shortness of breath: Secondary | ICD-10-CM | POA: Diagnosis not present

## 2017-05-27 DIAGNOSIS — I10 Essential (primary) hypertension: Secondary | ICD-10-CM | POA: Diagnosis not present

## 2017-05-27 DIAGNOSIS — Z981 Arthrodesis status: Secondary | ICD-10-CM

## 2017-05-27 DIAGNOSIS — D638 Anemia in other chronic diseases classified elsewhere: Secondary | ICD-10-CM | POA: Diagnosis present

## 2017-05-27 DIAGNOSIS — Z87891 Personal history of nicotine dependence: Secondary | ICD-10-CM | POA: Diagnosis not present

## 2017-05-27 DIAGNOSIS — R197 Diarrhea, unspecified: Secondary | ICD-10-CM | POA: Diagnosis not present

## 2017-05-27 DIAGNOSIS — Z85118 Personal history of other malignant neoplasm of bronchus and lung: Secondary | ICD-10-CM

## 2017-05-27 DIAGNOSIS — E039 Hypothyroidism, unspecified: Secondary | ICD-10-CM | POA: Diagnosis present

## 2017-05-27 DIAGNOSIS — Z881 Allergy status to other antibiotic agents status: Secondary | ICD-10-CM | POA: Diagnosis not present

## 2017-05-27 DIAGNOSIS — Z9889 Other specified postprocedural states: Secondary | ICD-10-CM | POA: Diagnosis present

## 2017-05-27 DIAGNOSIS — Y95 Nosocomial condition: Secondary | ICD-10-CM | POA: Diagnosis present

## 2017-05-27 DIAGNOSIS — Z880 Allergy status to penicillin: Secondary | ICD-10-CM

## 2017-05-27 DIAGNOSIS — D509 Iron deficiency anemia, unspecified: Secondary | ICD-10-CM | POA: Diagnosis present

## 2017-05-27 DIAGNOSIS — Z803 Family history of malignant neoplasm of breast: Secondary | ICD-10-CM

## 2017-05-27 DIAGNOSIS — J449 Chronic obstructive pulmonary disease, unspecified: Secondary | ICD-10-CM | POA: Diagnosis present

## 2017-05-27 DIAGNOSIS — Z9981 Dependence on supplemental oxygen: Secondary | ICD-10-CM

## 2017-05-27 DIAGNOSIS — T451X5A Adverse effect of antineoplastic and immunosuppressive drugs, initial encounter: Secondary | ICD-10-CM | POA: Diagnosis present

## 2017-05-27 DIAGNOSIS — R079 Chest pain, unspecified: Secondary | ICD-10-CM | POA: Diagnosis not present

## 2017-05-27 LAB — BASIC METABOLIC PANEL
ANION GAP: 10 (ref 5–15)
BUN: 6 mg/dL (ref 6–20)
CO2: 27 mmol/L (ref 22–32)
Calcium: 8.9 mg/dL (ref 8.9–10.3)
Chloride: 99 mmol/L — ABNORMAL LOW (ref 101–111)
Creatinine, Ser: 0.65 mg/dL (ref 0.44–1.00)
GFR calc Af Amer: >60 mL/min (ref >60)
Glucose, Bld: 96 mg/dL (ref 65–99)
POTASSIUM: 3.4 mmol/L — AB (ref 3.5–5.1)
SODIUM: 136 mmol/L (ref 135–145)

## 2017-05-27 LAB — CBC WITH DIFFERENTIAL/PLATELET
BASOS ABS: 0 10*3/uL (ref 0.0–0.1)
Basophils Relative: 0 %
EOS ABS: 0.3 10*3/uL (ref 0.0–0.7)
Eosinophils Relative: 4 %
HCT: 34.9 % — ABNORMAL LOW (ref 36.0–46.0)
Hemoglobin: 11.7 g/dL — ABNORMAL LOW (ref 12.0–15.0)
LYMPHS PCT: 16 %
Lymphs Abs: 1.2 10*3/uL (ref 0.7–4.0)
MCH: 31.4 pg (ref 26.0–34.0)
MCHC: 33.5 g/dL (ref 30.0–36.0)
MCV: 93.6 fL (ref 78.0–100.0)
Monocytes Absolute: 0.7 10*3/uL (ref 0.1–1.0)
Monocytes Relative: 11 %
Neutro Abs: 4.9 10*3/uL (ref 1.7–7.7)
Neutrophils Relative %: 69 %
PLATELETS: 240 10*3/uL (ref 150–400)
RBC: 3.73 MIL/uL — ABNORMAL LOW (ref 3.87–5.11)
RDW: 14.4 % (ref 11.5–15.5)
WBC: 7.1 10*3/uL (ref 4.0–10.5)

## 2017-05-27 LAB — URINALYSIS, ROUTINE W REFLEX MICROSCOPIC
Bilirubin Urine: NEGATIVE
Glucose, UA: NEGATIVE mg/dL
HGB URINE DIPSTICK: NEGATIVE
Ketones, ur: NEGATIVE mg/dL
Leukocytes, UA: NEGATIVE
Nitrite: NEGATIVE
PROTEIN: NEGATIVE mg/dL
Specific Gravity, Urine: 1.005 (ref 1.005–1.030)
pH: 7 (ref 5.0–8.0)

## 2017-05-27 MED ORDER — ONDANSETRON HCL 4 MG/2ML IJ SOLN
4.0000 mg | Freq: Once | INTRAMUSCULAR | Status: AC
  Administered 2017-05-27: 4 mg via INTRAVENOUS
  Filled 2017-05-27: qty 2

## 2017-05-27 MED ORDER — VANCOMYCIN HCL 10 G IV SOLR
1500.0000 mg | Freq: Once | INTRAVENOUS | Status: AC
  Administered 2017-05-27: 1500 mg via INTRAVENOUS
  Filled 2017-05-27 (×2): qty 1500

## 2017-05-27 MED ORDER — VANCOMYCIN HCL IN DEXTROSE 1-5 GM/200ML-% IV SOLN
1000.0000 mg | Freq: Two times a day (BID) | INTRAVENOUS | Status: DC
  Administered 2017-05-28 (×2): 1000 mg via INTRAVENOUS
  Filled 2017-05-27 (×2): qty 200

## 2017-05-27 MED ORDER — DEXTROSE 5 % IV SOLN
2.0000 g | Freq: Once | INTRAVENOUS | Status: DC
  Administered 2017-05-27: 2 g via INTRAVENOUS
  Filled 2017-05-27: qty 2

## 2017-05-27 MED ORDER — IOPAMIDOL (ISOVUE-300) INJECTION 61%
75.0000 mL | Freq: Once | INTRAVENOUS | Status: AC | PRN
  Administered 2017-05-27: 75 mL via INTRAVENOUS

## 2017-05-27 MED ORDER — SODIUM CHLORIDE 0.9 % IV BOLUS (SEPSIS)
1000.0000 mL | Freq: Once | INTRAVENOUS | Status: AC
  Administered 2017-05-27: 1000 mL via INTRAVENOUS

## 2017-05-27 MED ORDER — MORPHINE SULFATE (PF) 4 MG/ML IV SOLN
4.0000 mg | Freq: Once | INTRAVENOUS | Status: AC
  Administered 2017-05-27: 4 mg via INTRAVENOUS
  Filled 2017-05-27: qty 1

## 2017-05-27 MED ORDER — HYDROMORPHONE HCL 1 MG/ML IJ SOLN
1.0000 mg | INTRAMUSCULAR | Status: DC | PRN
  Administered 2017-05-27 – 2017-05-29 (×9): 1 mg via INTRAVENOUS
  Filled 2017-05-27 (×9): qty 1

## 2017-05-27 NOTE — ED Notes (Signed)
Diarrhea x 5 and n/v x7  In last 24 hrs. Mm dry. Has used her nausea meds at home and has not helped.

## 2017-05-27 NOTE — H&P (Signed)
History and Physical    Rebekah Henderson FVC:[id number] DOB: August 12, 1956 DOA: 05/27/2017  PCP: Redmond School, MD  Patient coming from: Home.    Chief Complaint: Nausea, vomiting, and diarrhea.  Some SOB. Found to have PNA on CT scan.   HPI: Rebekah Henderson is an 61 y.o. female with hx of COPD, lung CA undergoing chemo and radiation therapy (Dr Julien Nordmann), hx of hypothyrodis , chronic diarrhea, fibromyalgia on Vicodin up to 4 per day, lives alone, presented to the ER with nausea, vomiting, and diarrhea.  She has no abdominal pain, black or bloody stool.  Evauation showed no leukocytosis, and CXR showed possible infiltrates.  A CT with contrast of her chest showed stable lung CA, but found to have multilobar PNA suggestive of multilobar PNA.  She has some SOB, but no coughs, CP, a little chills, no rigors, and no documented fever.  She was started on IV Van/ Cefepime, but she is allergic to Keflex.  Hospitalist was asked to admit her for HCAP.  Her K was normal, and her Cr was OK as well.     ED Course:  See above.  Rewiew of Systems:  Constitutional: Negative for malaise, fever and chills. No significant weight loss or weight gain Eyes: Negative for eye pain, redness and discharge, diplopia, visual changes, or flashes of light. ENMT: Negative for ear pain, hoarseness, nasal congestion, sinus pressure and sore throat. No headaches; tinnitus, drooling, or problem swallowing. Cardiovascular: Negative for chest pain, palpitations, diaphoresis, dyspnea and peripheral edema. ; No orthopnea, PND Respiratory: Negative for cough, hemoptysis, wheezing and stridor. No pleuritic chestpain. Gastrointestinal: Negative for diarrhea, constipation,  melena, blood in stool, hematemesis, jaundice and rectal bleeding.    Genitourinary: Negative for frequency, dysuria, incontinence,flank pain and hematuria; Musculoskeletal: Negative for back pain and neck pain. Negative for swelling and trauma.;  Skin: . Negative for  pruritus, rash, abrasions, bruising and skin lesion.; ulcerations Neuro: Negative for headache, lightheadedness and neck stiffness. Negative for weakness, altered level of consciousness , altered mental status, extremity weakness, burning feet, involuntary movement, seizure and syncope.  Psych: negative for anxiety, depression, insomnia, tearfulness, panic attacks, hallucinations, paranoia, suicidal or homicidal ideation    Past Medical History:  Diagnosis Date  . 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 Anxiety    takes Prozac daily  . Arthritis   . Colon polyps   . COPD (chronic obstructive pulmonary disease) ([place])   . Dehydration 03/06/2017  . Diverticulitis   . Dyspnea    with exertion  . Encounter for antineoplastic chemotherapy 12/03/2016  . Fibromyalgia   . GERD (gastroesophageal reflux disease)    takes Pantoprazole daily  . Hypertension    takes Metoprolol,Triamterene-HCTZ,and Amlodipine daily  . Hypothyroidism    takes Synthroid daily  . IBS (irritable bowel syndrome)   . lung ca dx'd 10/02/2016   skin, lung  . PONV (postoperative nausea and vomiting)    pt also states that she had some difficulty breathing after cervical fusion    Past Surgical History:  Procedure Laterality Date  . BIOPSY N/A 05/25/2013   Procedure: BIOPSIES (Random Colon; Duodenal; Gastric);  Surgeon: Danie Binder, MD;  Location: [facility];  Service: Endoscopy;  Laterality: N/A;  . BLADDER SUSPENSION    . BREAST ENHANCEMENT SURGERY    . BREAST IMPLANT REMOVAL    . CERVICAL FUSION  AUG 2013  . CHOLECYSTECTOMY  1999  . COLONOSCOPY  2007 [place]   POLYPS  . COLONOSCOPY WITH PROPOFOL N/A  05/25/2013   Procedure: COLONOSCOPY WITH PROPOFOL(at cecum 0957) total withdrawal time=61min);  Surgeon: Danie Binder, MD;  Location: [facility];  Service: Endoscopy;  Laterality: N/A;  . ESOPHAGOGASTRODUODENOSCOPY (EGD) WITH PROPOFOL N/A 05/25/2013   Procedure: ESOPHAGOGASTRODUODENOSCOPY (EGD) WITH  PROPOFOL;  Surgeon: Danie Binder, MD;  Location: [facility];  Service: Endoscopy;  Laterality: N/A;  . FOOT SURGERY    . POLYPECTOMY N/A 05/25/2013   Procedure: POLYPECTOMY (Rectal and Gastric);  Surgeon: Danie Binder, MD;  Location: [facility];  Service: Endoscopy;  Laterality: N/A;  . TONSILLECTOMY    . UPPER GASTROINTESTINAL ENDOSCOPY    . VIDEO BRONCHOSCOPY WITH ENDOBRONCHIAL ULTRASOUND  09/12/2016   Procedure: VIDEO BRONCHOSCOPY WITH ENDOBRONCHIAL ULTRASOUND AND BIOPSY;  Surgeon: Juanito Doom, MD;  Location: [facility];  Service: Cardiopulmonary;;     reports that she quit smoking about 13 years ago. She has a 40.00 pack-year smoking history. She has never used smokeless tobacco. She reports that she does not drink alcohol or use drugs.  Allergies  Allergen Reactions  . Penicillins Hives, Swelling and Other (See Comments)    Reaction:  Face/mouth swelling  Has patient had a PCN reaction causing immediate rash, facial/tongue/throat swelling, SOB or lightheadedness with hypotension: Yes Has patient had a PCN reaction causing severe rash involving mucus membranes or skin necrosis: No Has patient had a PCN reaction that required hospitalization No Has patient had a PCN reaction occurring within the last 10 years: No If all of the above answers are "NO", then may proceed with Cephalosporin use.  . Codeine Nausea Only  . Ranitidine Hcl Other (See Comments)    Reaction:  Dizziness   . Keflex [Cephalexin] Other (See Comments)    Reaction:  Unknown   . Lyrica [Pregabalin] Other (See Comments)    Reaction:  Somnolence     Family History  Problem Relation Age of Onset  . Breast cancer Mother   . Diabetes Maternal Grandfather   . Lung cancer Father   . Heart failure Sister 68       Died. Morbidly obese  . Colon cancer Neg Hx   . Colon polyps Neg Hx      Prior to Admission medications   Medication Sig Start Date End Date Taking? Authorizing Provider  albuterol (PROVENTIL HFA;VENTOLIN  HFA) 108 (90 Base) MCG/ACT inhaler Inhale 2 puffs into the lungs every 4 (four) hours as needed for wheezing or shortness of breath.   Yes [provider]  amLODipine (NORVASC) 5 MG tablet Take 5 mg by mouth daily.     Yes [provider]  ciprofloxacin (CIPRO) 500 MG tablet Take 500 mg by mouth 2 (two) times daily. 7 day course starting on 05/23/2017 05/23/17  Yes [provider]  dicyclomine (BENTYL) 20 MG tablet Take 20 mg by mouth 3 (three) times daily as needed for spasms.   Yes [provider]  estazolam (PROSOM) 2 MG tablet Take 2 mg by mouth at bedtime.   Yes [provider]  estradiol (ESTRACE) 2 MG tablet Take 2 mg by mouth daily.     Yes [provider]  FeFum-FePoly-FA-B Cmp-C-Biot (INTEGRA PLUS) CAPS Take 1 capsule by mouth every morning. 05/16/17  Yes Curt Bears, MD  FLUoxetine (PROZAC) 40 MG capsule Take 40 mg by mouth daily.    Yes [provider]  HYDROcodone-acetaminophen (NORCO/VICODIN) 5-325 MG tablet Take 1 tablet by mouth every 4 (four) hours as needed for moderate pain.    Yes [provider]  levothyroxine (SYNTHROID, LEVOTHROID) 25 MCG tablet Take 25 mcg by mouth daily before breakfast.    Yes [provider]  loperamide (IMODIUM) 2 MG capsule Take 2 mg by mouth every 2 (two) hours as needed for diarrhea or loose stools.   Yes [provider]  magnesium oxide (MAG-OX) 400 (241.3 Mg) MG tablet Take 1 tablet (400 mg total) by mouth 2 (two) times daily. 05/07/17  Yes Curt Bears, MD  metoprolol succinate (TOPROL-XL) 25 MG 24 hr tablet Take 25 mg by mouth daily.   Yes [provider]  Multiple Vitamin (MULTIVITAMIN WITH MINERALS) TABS tablet Take 2 tablets by mouth daily.   Yes [provider]  ondansetron (ZOFRAN ODT) 4 MG disintegrating tablet Take 1 tablet (4 mg total) by mouth every 8 (eight) hours as needed for nausea or vomiting. 05/16/17  Yes Curt Bears, MD    pantoprazole (PROTONIX) 40 MG tablet Take 40 mg by mouth 2 (two) times daily before a meal.    Yes [provider]  potassium chloride SA (K-DUR,KLOR-CON) 20 MEQ tablet Take 2 tablets (40 mEq total) by mouth daily. 03/12/17  Yes Curt Bears, MD  triamterene-hydrochlorothiazide (MAXZIDE-25) 37.5-25 MG tablet Take 1 tablet by mouth daily.    [provider]    Physical Exam: Vitals:   05/27/17 1652 05/27/17 1656 05/27/17 1909  BP:  114/67 131/74  Pulse:  72 69  Resp:  20 19  Temp:  97.8 F (36.6 C)   TempSrc:  Oral   SpO2:  94% 97%  Weight: 77.6 kg (171 lb)    Height: 5\' 4"  (1.626 m)     Constitutional: NAD, calm, comfortable Vitals:   05/27/17 1652 05/27/17 1656 05/27/17 1909  BP:  114/67 131/74  Pulse:  72 69  Resp:  20 19  Temp:  97.8 F (36.6 C)   TempSrc:  Oral   SpO2:  94% 97%  Weight: 77.6 kg (171 lb)    Height: 5\' 4"  (1.626 m)     Eyes: PERRL, lids and conjunctivae normal ENMT: Mucous membranes are moist. Posterior pharynx clear of any exudate or lesions.Normal dentition.  Neck: normal, supple, no masses, no thyromegaly Respiratory: clear to auscultation bilaterally, no wheezing, no crackles. Normal respiratory effort. No accessory muscle use.  Cardiovascular: Regular rate and rhythm, no murmurs / rubs / gallops. No extremity edema. 2+ pedal pulses. No carotid bruits.  Abdomen: no tenderness, no masses palpated. No hepatosplenomegaly. Bowel sounds positive.  Musculoskeletal: no clubbing / cyanosis. No joint deformity upper and lower extremities. Good ROM, no contractures. Normal muscle tone.  Skin: no rashes, lesions, ulcers. No induration Neurologic: CN 2-12 grossly intact. Sensation intact, DTR normal. Strength 5/5 in all 4.  Psychiatric: Normal judgment and insight. Alert and oriented x 3. Normal mood.    Labs on Admission: I have personally reviewed following labs and imaging studies  CBC:  Recent Labs Lab 05/27/17 1713  WBC 7.1   NEUTROABS 4.9  HGB 11.7*  HCT 34.9*  MCV 93.6  PLT 725   Basic Metabolic Panel:  Recent Labs Lab 05/27/17 1713  NA 136  K 3.4*  CL 99*  CO2 27  GLUCOSE 96  BUN 6  CREATININE 0.65  CALCIUM 8.9   Urine analysis:    Component Value Date/Time   COLORURINE YELLOW 05/27/2017 1753   APPEARANCEUR HAZY (A) 05/27/2017 1753   LABSPEC 1.005 05/27/2017 1753   PHURINE 7.0 05/27/2017 [place] 05/27/2017 1753  HGBUR NEGATIVE 05/27/2017 1753   BILIRUBINUR NEGATIVE 05/27/2017 1753   KETONESUR NEGATIVE 05/27/2017 1753   PROTEINUR NEGATIVE 05/27/2017 1753   UROBILINOGEN 0.2 03/10/2014 1027   NITRITE NEGATIVE 05/27/2017 1753   LEUKOCYTESUR NEGATIVE 05/27/2017 1753   Radiological Exams on Admission: Dg Chest 2 View  Result Date: 05/27/2017 CLINICAL DATA:  Chest pain and shortness of breath EXAM: CHEST  2 VIEW COMPARISON:  CT chest 03/20/2017.  Chest x-ray 03/20/2017 FINDINGS: Interval decrease an left upper lobe opacity likely related to evolution of postradiation change. Cardiopericardial silhouette is upper normal and stable. Retrocardiac left base shows improved aeration since prior study. Less soft tissue fullness noted in the left mediastinum/ AP window region although there is more prominent soft tissue attenuation in the right hilar region. Interstitial changes in the right lung are new in the interval. No evidence for pleural effusion. No overt pulmonary edema. IMPRESSION: Interval improvement in aeration of left lung, likely related to evolution of post treatment changes. New subtle interstitial opacity in the right upper to midline on today's exam. This is nonspecific but infectious/inflammation could produce this appearance. These changes are associated with slightly more prominent soft tissue attenuation in the right hilum. CT chest with contrast may prove helpful to further evaluate. Electronically Signed   By: Misty Stanley M.D.   On: 05/27/2017 18:39   Ct Chest W  Contrast  Result Date: 05/27/2017 CLINICAL DATA:  Abnormal chest x-ray with worsened left-sided chest pain shortness of breath and productive cough EXAM: CT CHEST WITH CONTRAST TECHNIQUE: Multidetector CT imaging of the chest was performed during intravenous contrast administration. CONTRAST:  73mL ISOVUE-300 IOPAMIDOL (ISOVUE-300) INJECTION 61% COMPARISON:  Radiograph 05/27/2017, CT chest 03/20/2017, PET-CT 09/27/2016 FINDINGS: Cardiovascular: Aortic atherosclerosis. Ectatic ascending aorta up to 3.7 cm. Heart size within normal limits. Small pericardial effusion, slightly increased compared to prior. Moderate hiatal hernia Mediastinum/Nodes: Midline trachea. No thyroid mass. Esophagus within normal limits. Mediastinal/AP window soft tissue mass measures 3.2 x 2.9 cm and does not appear significantly changed. There is no increasing adenopathy within the mediastinum. Lungs/Pleura: Clearing of previously noted left pleural effusion. Residual consolidations and mild bronchiectasis in the medial left lower lobe and perihilar region, improved in appearance since the prior CT and likely related to post radiation change. Interval development of multifocal consolidations and ground-glass densities somewhat peripheral in distribution in the right upper lobe and to a lesser extent within the right middle and lower lobes. Upper Abdomen: Subcentimeter cortical hypodense lesion upper pole right kidney unchanged. No acute abnormality. Musculoskeletal: No acute or suspicious bone lesion. IMPRESSION: 1. Improved aeration of the left thorax since prior CT with residual largely paramediastinal consolidations, suspected to be secondary to post radiation change. 2. Interim development of similar appearance of density along the right paramediastinal border, however with additional multifocal consolidations and ground-glass densities greatest within the right upper and middle lobes with smaller foci in the right lower lobe ; findings  could be secondary to multifocal pneumonia. 3. Grossly stable mediastinal/AP window mass 4. Small pericardial effusion, increased compared to prior study 5. Moderate hiatal hernia Aortic Atherosclerosis (ICD10-I70.0). Electronically Signed   By: Donavan Foil M.D.   On: 05/27/2017 20:19    EKG: Independently reviewed.  Assessment/Plan Active Problems:   COPD (chronic obstructive pulmonary disease) ([facility])   Hypertension   Hypothyroidism   Anxiety   PONV (postoperative nausea and vomiting)   Dehydration   HCAP (healthcare-associated pneumonia)   PLAN:   HCAP:  She will need to be  Tx for HCAP, but  Will cover her for atypical as well.  She is allergic to Keflex, so will change Cefepime to Aztreonam.  So, her regimen is going to be Van/Azactam/Zithromax.  Will give oxygen.  She is stable.   Hypothryodisim:  Continue with supplement.  Check TSH.  HTN:  Stable.  COPD:  Stable.   Fibromyalgia:  She is asking for stronger pain meds.  Will give IV Dilaudid.  She said morphine didn't "touch" her.   DVT prophylaxis: SubQ Heparin.  Code Status: FULL CODE  (confirmed) Family Communication: None.  Disposition Plan: Home.  Consults called: None.  Admission status: inpatient.    Alexandros Ewan MD FACP. [facility]  If 7PM-7AM, please contact night-coverage [email or web address] Password [facility]  05/27/2017, 9:32 PM

## 2017-05-27 NOTE — ED Notes (Signed)
Patient ambulatory to bathroom without difficulty.  Advised patient to obtain urine specimen.

## 2017-05-27 NOTE — ED Triage Notes (Signed)
Currently being treated for stage 3 lung cancer. N/v/d x 4 days. C/o pain from Left shoulder into back and states it is her fibromyalgia. A/o.

## 2017-05-27 NOTE — ED Provider Notes (Addendum)
[facility] DEPT Provider Note   CSN: [id number] Arrival date & time: 05/27/17  1648     History   Chief Complaint Chief Complaint  Patient presents with  . Emesis  . Diarrhea    HPI Rebekah Henderson is a 61 y.o. female.  Nausea, vomiting, diarrhea for 3-4 days with associated pain in the posterior left scapula. 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 Rebekah Henderson is coughing and wheezing. Rebekah Henderson is on 2 L of oxygen 24 hours a day now. Rebekah Henderson feels dehydrated. Rebekah Henderson lives independently by herself. Severity of symptoms is moderate to severe.      Past Medical History:  Diagnosis Date  . 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 Anxiety    takes Prozac daily  . Arthritis   . Colon polyps   . COPD (chronic obstructive pulmonary disease) ([place])   . Dehydration 03/06/2017  . Diverticulitis   . Dyspnea    with exertion  . Encounter for antineoplastic chemotherapy 12/03/2016  . Fibromyalgia   . GERD (gastroesophageal reflux disease)    takes Pantoprazole daily  . Hypertension    takes Metoprolol,Triamterene-HCTZ,and Amlodipine daily  . Hypothyroidism    takes Synthroid daily  . IBS (irritable bowel syndrome)   . lung ca dx'd 10/02/2016   skin, lung  . PONV (postoperative nausea and vomiting)    pt also states that Rebekah Henderson had some difficulty breathing after cervical fusion    Patient Active Problem List   Diagnosis Date Noted  . Acute on chronic respiratory failure with hypoxia ([place]) 03/21/2017  . Mild protein-calorie malnutrition ([place]) 03/21/2017  . HCAP (healthcare-associated pneumonia) 03/20/2017  . Dehydration 03/06/2017  . Symptomatic anemia 01/04/2017  . Thrombocytopenia ([place]) 01/04/2017  . Influenza A 12/10/2016  . SIRS (systemic inflammatory response syndrome) ([place]) 12/07/2016  . Hypoxia 12/07/2016  . Bronchitis 12/07/2016  . Encounter for antineoplastic chemotherapy 12/03/2016  . Antineoplastic  chemotherapy induced anemia 12/03/2016  . Protein-calorie malnutrition, severe 10/30/2016  . Vomiting 10/29/2016  . Abdominal pain 10/29/2016  . Rash and nonspecific skin eruption 10/29/2016  . Neutropenic fever ([place]) 10/15/2016  . Intractable nausea and vomiting 10/15/2016  . Pancytopenia due to antineoplastic chemotherapy ([place]) 10/15/2016  . Nodular rash 10/15/2016  . Hypokalemia 10/15/2016  . Hypomagnesemia 10/15/2016  . Chronic respiratory failure with hypoxia ([place]) 10/15/2016  . Small cell lung cancer, left ([place]) 09/19/2016  . Mediastinal mass 09/10/2016  . COPD (chronic obstructive pulmonary disease) ([place])   . Hypertension   . Fibromyalgia   . IBS (irritable bowel syndrome)   . GERD (gastroesophageal reflux disease)   . Hypothyroidism   . Colon polyps   . Anxiety   . PONV (postoperative nausea and vomiting)   . Diarrhea 05/12/2013    Past Surgical History:  Procedure Laterality Date  . BIOPSY N/A 05/25/2013   Procedure: BIOPSIES (Random Colon; Duodenal; Gastric);  Surgeon: Danie Binder, MD;  Location: [facility];  Service: Endoscopy;  Laterality: N/A;  . BLADDER SUSPENSION    . BREAST ENHANCEMENT SURGERY    . BREAST IMPLANT REMOVAL    . CERVICAL FUSION  AUG 2013  . CHOLECYSTECTOMY  1999  . COLONOSCOPY  2007 [place]   POLYPS  . COLONOSCOPY WITH PROPOFOL N/A 05/25/2013   Procedure: COLONOSCOPY WITH PROPOFOL(at cecum 0957) total withdrawal time=22min);  Surgeon: Danie Binder, MD;  Location: [facility];  Service: Endoscopy;  Laterality: N/A;  . ESOPHAGOGASTRODUODENOSCOPY (EGD) WITH PROPOFOL  N/A 05/25/2013   Procedure: ESOPHAGOGASTRODUODENOSCOPY (EGD) WITH PROPOFOL;  Surgeon: Danie Binder, MD;  Location: [facility];  Service: Endoscopy;  Laterality: N/A;  . FOOT SURGERY    . POLYPECTOMY N/A 05/25/2013   Procedure: POLYPECTOMY (Rectal and Gastric);  Surgeon: Danie Binder, MD;  Location: [facility];  Service: Endoscopy;  Laterality: N/A;  . TONSILLECTOMY    . UPPER GASTROINTESTINAL  ENDOSCOPY    . VIDEO BRONCHOSCOPY WITH ENDOBRONCHIAL ULTRASOUND  09/12/2016   Procedure: VIDEO BRONCHOSCOPY WITH ENDOBRONCHIAL ULTRASOUND AND BIOPSY;  Surgeon: Juanito Doom, MD;  Location: [facility];  Service: Cardiopulmonary;;    OB History    No data available       Home Medications    Prior to Admission medications   Medication Sig Start Date End Date Taking? Authorizing Provider  albuterol (PROVENTIL HFA;VENTOLIN HFA) 108 (90 Base) MCG/ACT inhaler Inhale 2 puffs into the lungs every 4 (four) hours as needed for wheezing or shortness of breath.   Yes [provider]  amLODipine (NORVASC) 5 MG tablet Take 5 mg by mouth daily.     Yes [provider]  ciprofloxacin (CIPRO) 500 MG tablet Take 500 mg by mouth 2 (two) times daily. 7 day course starting on 05/23/2017 05/23/17  Yes [provider]  dicyclomine (BENTYL) 20 MG tablet Take 20 mg by mouth 3 (three) times daily as needed for spasms.   Yes [provider]  estazolam (PROSOM) 2 MG tablet Take 2 mg by mouth at bedtime.   Yes [provider]  estradiol (ESTRACE) 2 MG tablet Take 2 mg by mouth daily.     Yes [provider]  FeFum-FePoly-FA-B Cmp-C-Biot (INTEGRA PLUS) CAPS Take 1 capsule by mouth every morning. 05/16/17  Yes Curt Bears, MD  FLUoxetine (PROZAC) 40 MG capsule Take 40 mg by mouth daily.    Yes [provider]  HYDROcodone-acetaminophen (NORCO/VICODIN) 5-325 MG tablet Take 1 tablet by mouth every 4 (four) hours as needed for moderate pain.    Yes [provider]  levothyroxine (SYNTHROID, LEVOTHROID) 25 MCG tablet Take 25 mcg by mouth daily before breakfast.    Yes [provider]  loperamide (IMODIUM) 2 MG capsule Take 2 mg by mouth every 2 (two) hours as needed for diarrhea or loose stools.   Yes [provider]  magnesium oxide (MAG-OX) 400 (241.3 Mg) MG tablet Take 1 tablet (400 mg total) by mouth 2 (two) times daily. 05/07/17   Yes Curt Bears, MD  metoprolol succinate (TOPROL-XL) 25 MG 24 hr tablet Take 25 mg by mouth daily.   Yes [provider]  Multiple Vitamin (MULTIVITAMIN WITH MINERALS) TABS tablet Take 2 tablets by mouth daily.   Yes [provider]  ondansetron (ZOFRAN ODT) 4 MG disintegrating tablet Take 1 tablet (4 mg total) by mouth every 8 (eight) hours as needed for nausea or vomiting. 05/16/17  Yes Curt Bears, MD  pantoprazole (PROTONIX) 40 MG tablet Take 40 mg by mouth 2 (two) times daily before a meal.    Yes [provider]  potassium chloride SA (K-DUR,KLOR-CON) 20 MEQ tablet Take 2 tablets (40 mEq total) by mouth daily. 03/12/17  Yes Curt Bears, MD  triamterene-hydrochlorothiazide (MAXZIDE-25) 37.5-25 MG tablet Take 1 tablet by mouth daily.    [provider]    Family History Family History  Problem Relation Age of Onset  . Breast cancer Mother   . Diabetes Maternal Grandfather   . Lung cancer Father   .  Heart failure Sister 35       Died. Morbidly obese  . Colon cancer Neg Hx   . Colon polyps Neg Hx     Social History Social History  Substance Use Topics  . Smoking status: Former Smoker    Packs/day: 2.00    Years: 20.00    Quit date: 05/20/2004  . Smokeless tobacco: Never Used  . Alcohol use No     Allergies   Penicillins; Codeine; Ranitidine hcl; Keflex [cephalexin]; and Lyrica [pregabalin]   Review of Systems Review of Systems  All other systems reviewed and are negative.    Physical Exam Updated Vital Signs BP 131/74 (BP Location: Left Arm)   Pulse 69   Temp 97.8 F (36.6 C) (Oral)   Resp 19   Ht 5\' 4"  (1.626 m)   Wt 77.6 kg (171 lb)   SpO2 97%   BMI 29.35 kg/m   Physical Exam  Constitutional: Rebekah Henderson is oriented to person, place, and time.  Pale, dehydrated  HENT:  Head: Normocephalic and atraumatic.  Eyes: Conjunctivae are normal.  Neck: Neck supple.  Cardiovascular: Normal rate and regular rhythm.     Pulmonary/Chest: Effort normal and breath sounds normal.  Abdominal: Soft. Bowel sounds are normal.  Musculoskeletal: Normal range of motion.  Neurological: Rebekah Henderson is alert and oriented to person, place, and time.  Skin: Skin is warm and dry.  Psychiatric: Rebekah Henderson has a normal mood and affect. Her behavior is normal.  Nursing note and vitals reviewed.    ED Treatments / Results  Labs (all labs ordered are listed, but only abnormal results are displayed) Labs Reviewed  CBC WITH DIFFERENTIAL/PLATELET - Abnormal; Notable for the following:       Result Value   RBC 3.73 (*)    Hemoglobin 11.7 (*)    HCT 34.9 (*)    All other components within normal limits  BASIC METABOLIC PANEL - Abnormal; Notable for the following:    Potassium 3.4 (*)    Chloride 99 (*)    All other components within normal limits  URINALYSIS, ROUTINE W REFLEX MICROSCOPIC - Abnormal; Notable for the following:    APPearance HAZY (*)    All other components within normal limits    EKG  EKG Interpretation None       Radiology Dg Chest 2 View  Result Date: 05/27/2017 CLINICAL DATA:  Chest pain and shortness of breath EXAM: CHEST  2 VIEW COMPARISON:  CT chest 03/20/2017.  Chest x-ray 03/20/2017 FINDINGS: Interval decrease an left upper lobe opacity likely related to evolution of postradiation change. Cardiopericardial silhouette is upper normal and stable. Retrocardiac left base shows improved aeration since prior study. Less soft tissue fullness noted in the left mediastinum/ AP window region although there is more prominent soft tissue attenuation in the right hilar region. Interstitial changes in the right lung are new in the interval. No evidence for pleural effusion. No overt pulmonary edema. IMPRESSION: Interval improvement in aeration of left lung, likely related to evolution of post treatment changes. New subtle interstitial opacity in the right upper to midline on today's exam. This is nonspecific but  infectious/inflammation could produce this appearance. These changes are associated with slightly more prominent soft tissue attenuation in the right hilum. CT chest with contrast may prove helpful to further evaluate. Electronically Signed   By: Misty [facility] M.D.   On: 05/27/2017 18:39   Ct Chest W Contrast  Result Date: 05/27/2017 CLINICAL DATA:  Abnormal chest x-ray with worsened  left-sided chest pain shortness of breath and productive cough EXAM: CT CHEST WITH CONTRAST TECHNIQUE: Multidetector CT imaging of the chest was performed during intravenous contrast administration. CONTRAST:  24mL ISOVUE-300 IOPAMIDOL (ISOVUE-300) INJECTION 61% COMPARISON:  Radiograph 05/27/2017, CT chest 03/20/2017, PET-CT 09/27/2016 FINDINGS: Cardiovascular: Aortic atherosclerosis. Ectatic ascending aorta up to 3.7 cm. Heart size within normal limits. Small pericardial effusion, slightly increased compared to prior. Moderate hiatal hernia Mediastinum/Nodes: Midline trachea. No thyroid mass. Esophagus within normal limits. Mediastinal/AP window soft tissue mass measures 3.2 x 2.9 cm and does not appear significantly changed. There is no increasing adenopathy within the mediastinum. Lungs/Pleura: Clearing of previously noted left pleural effusion. Residual consolidations and mild bronchiectasis in the medial left lower lobe and perihilar region, improved in appearance since the prior CT and likely related to post radiation change. Interval development of multifocal consolidations and ground-glass densities somewhat peripheral in distribution in the right upper lobe and to a lesser extent within the right middle and lower lobes. Upper Abdomen: Subcentimeter cortical hypodense lesion upper pole right kidney unchanged. No acute abnormality. Musculoskeletal: No acute or suspicious bone lesion. IMPRESSION: 1. Improved aeration of the left thorax since prior CT with residual largely paramediastinal consolidations, suspected to be  secondary to post radiation change. 2. Interim development of similar appearance of density along the right paramediastinal border, however with additional multifocal consolidations and ground-glass densities greatest within the right upper and middle lobes with smaller foci in the right lower lobe ; findings could be secondary to multifocal pneumonia. 3. Grossly stable mediastinal/AP window mass 4. Small pericardial effusion, increased compared to prior study 5. Moderate hiatal hernia Aortic Atherosclerosis (ICD10-I70.0). Electronically Signed   By: Donavan Foil M.D.   On: 05/27/2017 20:19    Procedures Procedures (including critical care time)  Medications Ordered in ED Medications  ceFEPIme (MAXIPIME) 2 g in dextrose 5 % 50 mL IVPB (not administered)  vancomycin (VANCOCIN) 1,500 mg in sodium chloride 0.9 % 500 mL IVPB (not administered)  vancomycin (VANCOCIN) IVPB 1000 mg/200 mL premix (not administered)  ondansetron (ZOFRAN) injection 4 mg (4 mg Intravenous Given 05/27/17 1750)  sodium chloride 0.9 % bolus 1,000 mL (1,000 mLs Intravenous New Bag/Given 05/27/17 1840)  morphine 4 MG/ML injection 4 mg (4 mg Intravenous Given 05/27/17 1755)  sodium chloride 0.9 % bolus 1,000 mL (0 mLs Intravenous Stopped 05/27/17 1840)  ondansetron (ZOFRAN) injection 4 mg (4 mg Intravenous Given 05/27/17 1916)  iopamidol (ISOVUE-300) 61 % injection 75 mL (75 mLs Intravenous Contrast Given 05/27/17 1931)     Initial Impression / Assessment and Plan / ED Course  I have reviewed the triage vital signs and the nursing notes.  Pertinent labs & imaging results that were available during my care of the patient were reviewed by me and considered in my medical decision making (see chart for details).     Patient is immunocompromised. Rebekah Henderson is dehydrated from nausea and vomiting and diarrhea. CT chest shows possible pneumonia. Will start antibiotics for HCAP, IV fluids, admit to general medicine.  Final Clinical  Impressions(s) / ED Diagnoses   Final diagnoses:  Small cell lung cancer ([place])  HCAP (healthcare-associated pneumonia)  Nausea vomiting and diarrhea    New Prescriptions New Prescriptions   No medications on file     Nat Christen, MD 05/27/17 3546    Nat Christen, MD 05/27/17 2124

## 2017-05-27 NOTE — Progress Notes (Signed)
Pharmacy Antibiotic Note  Rebekah Henderson is a 61 y.o. female admitted on 05/27/2017 with pneumonia.  Pharmacy has been consulted for Vancomycin dosing.  Plan: Vancomycin 1500mg  x 1 then 1000mg  q12h Monitor labs, progress, c/s  Height: 5\' 4"  (162.6 cm) Weight: 171 lb (77.6 kg) IBW/kg (Calculated) : 54.7  Temp (24hrs), Avg:97.8 F (36.6 C), Min:97.8 F (36.6 C), Max:97.8 F (36.6 C)   Recent Labs Lab 05/27/17 1713  WBC 7.1  CREATININE 0.65    Estimated Creatinine Clearance: 72.6 mL/min (by C-G formula based on SCr of 0.65 mg/dL).    Allergies  Allergen Reactions  . Penicillins Hives, Swelling and Other (See Comments)    Reaction:  Face/mouth swelling  Has patient had a PCN reaction causing immediate rash, facial/tongue/throat swelling, SOB or lightheadedness with hypotension: Yes Has patient had a PCN reaction causing severe rash involving mucus membranes or skin necrosis: No Has patient had a PCN reaction that required hospitalization No Has patient had a PCN reaction occurring within the last 10 years: No If all of the above answers are "NO", then may proceed with Cephalosporin use.  . Codeine Nausea Only  . Ranitidine Hcl Other (See Comments)    Reaction:  Dizziness   . Keflex [Cephalexin] Other (See Comments)    Reaction:  Unknown   . Lyrica [Pregabalin] Other (See Comments)    Reaction:  Somnolence    Antimicrobials this admission: Vancomycin 6/26 >>  Cefepime 6/26 >>   Dose adjustments this admission:  Microbiology results:  BCx:   UCx:    Sputum:    MRSA PCR:   Thank you for allowing pharmacy to be a part of this patient's care.  Hart Robinsons A 05/27/2017 9:15 PM

## 2017-05-28 DIAGNOSIS — C3490 Malignant neoplasm of unspecified part of unspecified bronchus or lung: Secondary | ICD-10-CM

## 2017-05-28 DIAGNOSIS — J441 Chronic obstructive pulmonary disease with (acute) exacerbation: Secondary | ICD-10-CM

## 2017-05-28 LAB — MRSA PCR SCREENING: MRSA by PCR: NEGATIVE

## 2017-05-28 LAB — TSH: TSH: 1.257 u[IU]/mL (ref 0.350–4.500)

## 2017-05-28 MED ORDER — PRO-STAT SUGAR FREE PO LIQD
30.0000 mL | Freq: Two times a day (BID) | ORAL | Status: DC
  Administered 2017-05-28: 30 mL via ORAL
  Filled 2017-05-28 (×3): qty 30

## 2017-05-28 MED ORDER — ORAL CARE MOUTH RINSE
15.0000 mL | Freq: Two times a day (BID) | OROMUCOSAL | Status: DC
  Administered 2017-05-28 (×3): 15 mL via OROMUCOSAL

## 2017-05-28 MED ORDER — TIOTROPIUM BROMIDE MONOHYDRATE 18 MCG IN CAPS
18.0000 ug | ORAL_CAPSULE | Freq: Every day | RESPIRATORY_TRACT | Status: DC
  Administered 2017-05-29: 18 ug via RESPIRATORY_TRACT
  Filled 2017-05-28: qty 5

## 2017-05-28 MED ORDER — FLUOXETINE HCL 20 MG PO CAPS
40.0000 mg | ORAL_CAPSULE | Freq: Every day | ORAL | Status: DC
  Administered 2017-05-28 – 2017-05-29 (×2): 40 mg via ORAL
  Filled 2017-05-28 (×2): qty 2

## 2017-05-28 MED ORDER — DEXTROSE 5 % IV SOLN
1.0000 g | Freq: Three times a day (TID) | INTRAVENOUS | Status: DC
  Administered 2017-05-28 – 2017-05-29 (×3): 1 g via INTRAVENOUS
  Filled 2017-05-28 (×7): qty 1

## 2017-05-28 MED ORDER — ALBUTEROL SULFATE (2.5 MG/3ML) 0.083% IN NEBU: 3.0000 mL | INHALATION_SOLUTION | RESPIRATORY_TRACT | Status: DC | PRN

## 2017-05-28 MED ORDER — LEVOTHYROXINE SODIUM 50 MCG PO TABS
25.0000 ug | ORAL_TABLET | Freq: Every day | ORAL | Status: DC
  Administered 2017-05-28 – 2017-05-29 (×2): 25 ug via ORAL
  Filled 2017-05-28 (×2): qty 1

## 2017-05-28 MED ORDER — LOPERAMIDE HCL 2 MG PO CAPS: 2.0000 mg | ORAL_CAPSULE | ORAL | Status: DC | PRN

## 2017-05-28 MED ORDER — PANTOPRAZOLE SODIUM 40 MG PO TBEC
40.0000 mg | DELAYED_RELEASE_TABLET | Freq: Two times a day (BID) | ORAL | Status: DC
  Administered 2017-05-28 – 2017-05-29 (×3): 40 mg via ORAL
  Filled 2017-05-28 (×3): qty 1

## 2017-05-28 MED ORDER — TEMAZEPAM 15 MG PO CAPS
15.0000 mg | ORAL_CAPSULE | Freq: Every evening | ORAL | Status: DC | PRN
  Administered 2017-05-28 (×2): 15 mg via ORAL
  Filled 2017-05-28 (×2): qty 1

## 2017-05-28 MED ORDER — ONDANSETRON HCL 4 MG/2ML IJ SOLN: 4.0000 mg | Freq: Four times a day (QID) | INTRAMUSCULAR | Status: DC | PRN

## 2017-05-28 MED ORDER — PREDNISONE 20 MG PO TABS
40.0000 mg | ORAL_TABLET | Freq: Every day | ORAL | Status: DC
  Administered 2017-05-28 – 2017-05-29 (×2): 40 mg via ORAL
  Filled 2017-05-28 (×2): qty 2

## 2017-05-28 MED ORDER — SODIUM CHLORIDE 0.9 % IV SOLN
8.0000 mg | Freq: Three times a day (TID) | INTRAVENOUS | Status: DC
Start: 2017-05-28 — End: 2017-05-29
  Administered 2017-05-28 – 2017-05-29 (×3): 8 mg via INTRAVENOUS
  Filled 2017-05-28 (×7): qty 4

## 2017-05-28 MED ORDER — KCL IN DEXTROSE-NACL 20-5-0.9 MEQ/L-%-% IV SOLN
INTRAVENOUS | Status: DC
  Administered 2017-05-28: 02:00:00 via INTRAVENOUS

## 2017-05-28 MED ORDER — HEPARIN SODIUM (PORCINE) 5000 UNIT/ML IJ SOLN
5000.0000 [IU] | Freq: Three times a day (TID) | INTRAMUSCULAR | Status: DC
  Administered 2017-05-28 – 2017-05-29 (×4): 5000 [IU] via SUBCUTANEOUS
  Filled 2017-05-28 (×4): qty 1

## 2017-05-28 MED ORDER — ESTRADIOL 1 MG PO TABS
2.0000 mg | ORAL_TABLET | Freq: Every day | ORAL | Status: DC
  Administered 2017-05-28 – 2017-05-29 (×2): 2 mg via ORAL
  Filled 2017-05-28 (×2): qty 2

## 2017-05-28 MED ORDER — AMLODIPINE BESYLATE 5 MG PO TABS
5.0000 mg | ORAL_TABLET | Freq: Every day | ORAL | Status: DC
  Administered 2017-05-28 – 2017-05-29 (×2): 5 mg via ORAL
  Filled 2017-05-28 (×2): qty 1

## 2017-05-28 MED ORDER — PROMETHAZINE HCL 25 MG/ML IJ SOLN: 12.5000 mg | Freq: Four times a day (QID) | INTRAMUSCULAR | Status: DC | PRN

## 2017-05-28 MED ORDER — BOOST / RESOURCE BREEZE PO LIQD: 1.0000 | Freq: Three times a day (TID) | ORAL | Status: DC

## 2017-05-28 MED ORDER — DEXTROSE 5 % IV SOLN
500.0000 mg | INTRAVENOUS | Status: DC
  Administered 2017-05-28 – 2017-05-29 (×2): 500 mg via INTRAVENOUS
  Filled 2017-05-28 (×4): qty 500

## 2017-05-28 MED ORDER — AZTREONAM 2 G IJ SOLR
2.0000 g | Freq: Once | INTRAMUSCULAR | Status: AC
  Administered 2017-05-28: 2 g via INTRAVENOUS
  Filled 2017-05-28 (×2): qty 2

## 2017-05-28 MED ORDER — FE FUMARATE-B12-VIT C-FA-IFC PO CAPS
1.0000 | ORAL_CAPSULE | Freq: Every morning | ORAL | Status: DC
  Administered 2017-05-28 – 2017-05-29 (×2): 1 via ORAL
  Filled 2017-05-28 (×3): qty 1

## 2017-05-28 MED ORDER — METOPROLOL SUCCINATE ER 25 MG PO TB24
25.0000 mg | ORAL_TABLET | Freq: Every day | ORAL | Status: DC
  Administered 2017-05-28 – 2017-05-29 (×2): 25 mg via ORAL
  Filled 2017-05-28 (×2): qty 1

## 2017-05-28 MED ORDER — SODIUM CHLORIDE 0.9% FLUSH
3.0000 mL | Freq: Two times a day (BID) | INTRAVENOUS | Status: DC
  Administered 2017-05-28 (×3): 3 mL via INTRAVENOUS

## 2017-05-28 MED ORDER — ONDANSETRON HCL 4 MG PO TABS: 4.0000 mg | ORAL_TABLET | Freq: Four times a day (QID) | ORAL | Status: DC | PRN

## 2017-05-28 NOTE — Progress Notes (Signed)
PROGRESS NOTE  Rebekah Henderson  UUV:[id number] DOB: 1956-05-29 DOA: 05/27/2017 PCP: Redmond School, MD  Brief Narrative:   Rebekah Henderson is an 61 y.o. female with hx of COPD, lung CA undergoing chemo and radiation therapy (Dr Julien Nordmann), hx of hypothyrodis, chronic diarrhea, fibromyalgia on Vicodin up to 4 per day, lives alone, presented to the ER with nausea, vomiting, and cough.   She completed whole brain radiation recently and it soon after her radiation completed she developed nausea and vomiting which has persisted despite a trial of Decadron.  For the last couple of weeks she has had worsening shortness of breath with a "croup-like cough."  She normally only wears oxygen at night but for the last few weeks she has had to wear continuously. CT with contrast of her chest showed stable lung CA, but found to have multilobar PNA.  She was started on antibiotics for healthcare associated pneumonia due to a hospitalization in April this year.  She continues to have some cough and shortness of breath. She was unable to eat anything for breakfast and is having difficulty tolerating fluids.  Assessment & Plan:   Active Problems:   COPD (chronic obstructive pulmonary disease) (HCC)   Hypertension   Hypothyroidism   Anxiety   PONV (postoperative nausea and vomiting)   Dehydration   HCAP (healthcare-associated pneumonia)  Acute on chronic respiratory failure with hypoxia secondary to healthcare associated pneumonia vs. Radiation pneumonitis. Her CT scan of the chest suggests multifocal pneumonia. She completed her chest radiation in March.  Possible that the ground glass and nodular findings on CT chest are related her to malignancy.   -  Continue broad-spectrum antibiotics for healthcare associated pneumonia -  Add prednisone 40 mg daily -  Add Spiriva -  Continue when necessary albuterol -  Continue oxygen -  Ambulatory pulse ox in the morning  Acute COPD exacerbation, with wheezing and shortness  of breath and increased cough -  Steroids, antibiotics as above  Poor oral intake 1 week -  Nutrition consultation -  Liberalize diet -  Supplements -  Scheduled Zofran -  Add when necessary Phenergan -  Steroids as above  Hypothyroidism, TSH 1.257, continue synthroid  Essential hypertension, blood pressure low normal -  Continue norvasc and metoprolol -  Diuretics held  Fibromyalgia -  Resume prn vicodin for pain  Non-small cell lung cancer, completed chemotherapy in March, completed chest radiation around the same time, completed whole brain radiation a few weeks ago.  DVT prophylaxis:  Lovenox Code Status:  Full code Family Communication:  Patient alone Disposition Plan:  Possibly home tomorrow if tolerating by mouth, likely on levofloxacin   Consultants:   none  Procedures:  CT chest  Antimicrobials:  Anti-infectives    Start     Dose/Rate Route Frequency Ordered Stop   05/28/17 1000  vancomycin (VANCOCIN) IVPB 1000 mg/200 mL premix     1,000 mg 200 mL/hr over 60 Minutes Intravenous Every 12 hours 05/27/17 2114     05/28/17 1000  aztreonam (AZACTAM) 1 g in dextrose 5 % 50 mL IVPB     1 g 100 mL/hr over 30 Minutes Intravenous Every 8 hours 05/28/17 0738     05/28/17 0015  aztreonam (AZACTAM) 2 g in dextrose 5 % 50 mL IVPB     2 g 100 mL/hr over 30 Minutes Intravenous  Once 05/28/17 0013 05/28/17 0217   05/28/17 0000  azithromycin (ZITHROMAX) 500 mg in dextrose 5 % 250 mL  IVPB     500 mg 250 mL/hr over 60 Minutes Intravenous Every 24 hours 05/28/17 0000     05/27/17 2115  vancomycin (VANCOCIN) 1,500 mg in sodium chloride 0.9 % 500 mL IVPB     1,500 mg 250 mL/hr over 120 Minutes Intravenous  Once 05/27/17 2113 05/28/17 0025   05/27/17 2045  ceFEPIme (MAXIPIME) 2 g in dextrose 5 % 50 mL IVPB  Status:  Discontinued     2 g 100 mL/hr over 30 Minutes Intravenous  Once 05/27/17 2039 05/27/17 2150       Subjective: Unable to tolerate by mouth. Still having  severe nausea. Still feels very wheezy and having a croupy cough.  Does not like supplements. Was constipated which resolved over the weekend. She had some transient diarrhea after constipation resolved which has also resolved. She has had normal bowel movement since.  Objective: Vitals:   05/27/17 2153 05/28/17 0000 05/28/17 0012 05/28/17 0623  BP: (!) 110/53 127/82  101/60  Pulse: 73 86  76  Resp: 18 18  18   Temp:  97.5 F (36.4 C)  97.8 F (36.6 C)  TempSrc:  Oral  Oral  SpO2: 97% 91% 98% 96%  Weight:      Height:        Intake/Output Summary (Last 24 hours) at 05/28/17 1446 Last data filed at 05/28/17 0300  Gross per 24 hour  Intake          1383.33 ml  Output                0 ml  Net          1383.33 ml   Filed Weights   05/27/17 1652  Weight: 77.6 kg (171 lb)    Examination:  General exam:  Adult Female.  No acute distress.  HEENT:  NCAT, MMM Respiratory system: Diminished at the left base, no focal rales, rhonchi, or obvious wheeze. Cardiovascular system: Regular rate and rhythm, normal S1/S2. No murmurs, rubs, gallops or clicks.  Warm extremities Gastrointestinal system: Normal active bowel sounds, soft, nondistended, nontender. MSK:  Normal tone and bulk, no lower extremity edema Neuro:  Grossly intact    Data Reviewed: I have personally reviewed following labs and imaging studies  CBC:  Recent Labs Lab 05/27/17 1713  WBC 7.1  NEUTROABS 4.9  HGB 11.7*  HCT 34.9*  MCV 93.6  PLT 921   Basic Metabolic Panel:  Recent Labs Lab 05/27/17 1713  NA 136  K 3.4*  CL 99*  CO2 27  GLUCOSE 96  BUN 6  CREATININE 0.65  CALCIUM 8.9   GFR: Estimated Creatinine Clearance: 72.6 mL/min (by C-G formula based on SCr of 0.65 mg/dL). Liver Function Tests: No results for input(s): AST, ALT, ALKPHOS, BILITOT, PROT, ALBUMIN in the last 168 hours. No results for input(s): LIPASE, AMYLASE in the last 168 hours. No results for input(s): AMMONIA in the last 168  hours. Coagulation Profile: No results for input(s): INR, PROTIME in the last 168 hours. Cardiac Enzymes: No results for input(s): CKTOTAL, CKMB, CKMBINDEX, TROPONINI in the last 168 hours. BNP (last 3 results) No results for input(s): PROBNP in the last 8760 hours. HbA1C: No results for input(s): HGBA1C in the last 72 hours. CBG: No results for input(s): GLUCAP in the last 168 hours. Lipid Profile: No results for input(s): CHOL, HDL, LDLCALC, TRIG, CHOLHDL, LDLDIRECT in the last 72 hours. Thyroid Function Tests:  Recent Labs  05/27/17 1713  TSH 1.257   Anemia  Panel: No results for input(s): VITAMINB12, FOLATE, FERRITIN, TIBC, IRON, RETICCTPCT in the last 72 hours. Urine analysis:    Component Value Date/Time   COLORURINE YELLOW 05/27/2017 1753   APPEARANCEUR HAZY (A) 05/27/2017 1753   LABSPEC 1.005 05/27/2017 1753   PHURINE 7.0 05/27/2017 1753   GLUCOSEU NEGATIVE 05/27/2017 1753   HGBUR NEGATIVE 05/27/2017 1753   BILIRUBINUR NEGATIVE 05/27/2017 1753   KETONESUR NEGATIVE 05/27/2017 1753   PROTEINUR NEGATIVE 05/27/2017 1753   UROBILINOGEN 0.2 03/10/2014 1027   NITRITE NEGATIVE 05/27/2017 1753   LEUKOCYTESUR NEGATIVE 05/27/2017 1753   Sepsis Labs: @LABRCNTIP (procalcitonin:4,lacticidven:4)  )No results found for this or any previous visit (from the past 240 hour(s)).    Radiology Studies: Dg Chest 2 View  Result Date: 05/27/2017 CLINICAL DATA:  Chest pain and shortness of breath EXAM: CHEST  2 VIEW COMPARISON:  CT chest 03/20/2017.  Chest x-ray 03/20/2017 FINDINGS: Interval decrease an left upper lobe opacity likely related to evolution of postradiation change. Cardiopericardial silhouette is upper normal and stable. Retrocardiac left base shows improved aeration since prior study. Less soft tissue fullness noted in the left mediastinum/ AP window region although there is more prominent soft tissue attenuation in the right hilar region. Interstitial changes in the right  lung are new in the interval. No evidence for pleural effusion. No overt pulmonary edema. IMPRESSION: Interval improvement in aeration of left lung, likely related to evolution of post treatment changes. New subtle interstitial opacity in the right upper to midline on today's exam. This is nonspecific but infectious/inflammation could produce this appearance. These changes are associated with slightly more prominent soft tissue attenuation in the right hilum. CT chest with contrast may prove helpful to further evaluate. Electronically Signed   By: Misty Stanley M.D.   On: 05/27/2017 18:39   Ct Chest W Contrast  Result Date: 05/27/2017 CLINICAL DATA:  Abnormal chest x-ray with worsened left-sided chest pain shortness of breath and productive cough EXAM: CT CHEST WITH CONTRAST TECHNIQUE: Multidetector CT imaging of the chest was performed during intravenous contrast administration. CONTRAST:  30mL ISOVUE-300 IOPAMIDOL (ISOVUE-300) INJECTION 61% COMPARISON:  Radiograph 05/27/2017, CT chest 03/20/2017, PET-CT 09/27/2016 FINDINGS: Cardiovascular: Aortic atherosclerosis. Ectatic ascending aorta up to 3.7 cm. Heart size within normal limits. Small pericardial effusion, slightly increased compared to prior. Moderate hiatal hernia Mediastinum/Nodes: Midline trachea. No thyroid mass. Esophagus within normal limits. Mediastinal/AP window soft tissue mass measures 3.2 x 2.9 cm and does not appear significantly changed. There is no increasing adenopathy within the mediastinum. Lungs/Pleura: Clearing of previously noted left pleural effusion. Residual consolidations and mild bronchiectasis in the medial left lower lobe and perihilar region, improved in appearance since the prior CT and likely related to post radiation change. Interval development of multifocal consolidations and ground-glass densities somewhat peripheral in distribution in the right upper lobe and to a lesser extent within the right middle and lower lobes.  Upper Abdomen: Subcentimeter cortical hypodense lesion upper pole right kidney unchanged. No acute abnormality. Musculoskeletal: No acute or suspicious bone lesion. IMPRESSION: 1. Improved aeration of the left thorax since prior CT with residual largely paramediastinal consolidations, suspected to be secondary to post radiation change. 2. Interim development of similar appearance of density along the right paramediastinal border, however with additional multifocal consolidations and ground-glass densities greatest within the right upper and middle lobes with smaller foci in the right lower lobe ; findings could be secondary to multifocal pneumonia. 3. Grossly stable mediastinal/AP window mass 4. Small pericardial effusion, increased compared to prior  study 5. Moderate hiatal hernia Aortic Atherosclerosis (ICD10-I70.0). Electronically Signed   By: Donavan Foil M.D.   On: 05/27/2017 20:19     Scheduled Meds: . amLODipine  5 mg Oral Daily  . estradiol  2 mg Oral Daily  . feeding supplement (PRO-STAT SUGAR FREE 64)  30 mL Oral BID  . ferrous BVAPOLID-C30-DTHYHOO C-folic acid  1 capsule Oral q morning - 10a  . FLUoxetine  40 mg Oral Daily  . heparin  5,000 Units Subcutaneous Q8H  . levothyroxine  25 mcg Oral QAC breakfast  . mouth rinse  15 mL Mouth Rinse BID  . metoprolol succinate  25 mg Oral Daily  . pantoprazole  40 mg Oral BID AC  . predniSONE  40 mg Oral Q breakfast  . sodium chloride flush  3 mL Intravenous Q12H  . tiotropium  18 mcg Inhalation Daily   Continuous Infusions: . azithromycin Stopped (05/28/17 0340)  . aztreonam Stopped (05/28/17 1024)  . dextrose 5 % and 0.9 % NaCl with KCl 20 mEq/L 50 mL/hr at 05/28/17 0144  . ondansetron (ZOFRAN) IV 8 mg (05/28/17 1401)  . vancomycin Stopped (05/28/17 1147)     LOS: 1 day    Time spent: 30 min    Janece Canterbury, MD [facility] Pager [phone]  If 7PM-7AM, please contact night-coverage [email or web address] Password  [facility] 05/28/2017, 2:46 PM

## 2017-05-28 NOTE — Progress Notes (Signed)
Initial Nutrition Assessment  DOCUMENTATION CODES:  Not applicable  INTERVENTION:  Foods that should be easy to tolerate placed on patients meal trays  To attempt to provide protein Will order 30 mL Prostat BID, each supplement provides 100 kcal and 15 grams of protein. Can try placing in juice, which she drinks frequently.   NUTRITION DIAGNOSIS:  Inadequate oral intake related to acute illness, nausea, vomiting, poor appetite as evidenced by Pt's report of no intake x 1 week.   GOAL:  Patient will meet greater than or equal to 90% of their needs  MONITOR:  PO intake, Supplement acceptance, Labs, I & O's  REASON FOR ASSESSMENT:  Consult Assessment of nutrition requirement/status  ASSESSMENT:  61 y/o female PMhx COPD, Anxiety, IBS, GERD, HTN, and Small cell lung cancer undergoing chemoradiation. Pt presented to ED with nausea/vomiting for a week and diarrhea x 5 days. CT chest showed stable lung cancer, but multilobar PNA. Admitted for management of HCAP, RD asked to assess.   Pt reports that she has essentially not eaten or drank for a week. Her n/v has gone on for 1 week. She says when she would eat or drink she would immediately vomit, so she stopped trying to eat. She says her diarrhea began ~ 5 days ago and has been intermittent; she did not have any yesterday or the day before, but had some today.   She has had recent radiotherapy for her cancer and she reports her current symptoms are consistent with what has occurred in past after her radiation treatments; she typically has poor po intake, nausea and vomiting following them. At home, she did not drink any supplements. She did take a mvi. She will not eat dairy or drink anything of milk like consistency.   She says her UBW is 163 lbs? Per chart, it appears she weighed ~195 lbs when she was diagnosed. She had fallen to as low as 165 lbs, but appears to have started to regain weight over the last couple months.   Physical Exam: No  discernible fat/muscle wasting.   At this time, pt is still having trouble tolerating diet. She was unable to eat breakfast "due to my stomach". RD discussed importance she eat given that she has an active infection and has not eaten x 1 week. She refuses all supplements, stating they make her sick. She was agreeable to a variety of items on her tray that she should be able to tolerate: Fruit, jello, popsicle, pudding though these will provide minimal protein.->will try prostat.   Labs: Albumin: 3.4, k:3.4 Meds: Foltrin MVI, Boost/resource breeze (refuses), ppi, Prednisone,  IV abx, IVF, IV zofran, PRN phenergan/immodium   Recent Labs Lab 05/27/17 1713  NA 136  K 3.4*  CL 99*  CO2 27  BUN 6  CREATININE 0.65  CALCIUM 8.9  GLUCOSE 96   Diet Order:  Diet regular Room service appropriate? Yes; Fluid consistency: Thin  Skin:  Reviewed, no issues  Last BM:  6/27-per pt  Height:  Ht Readings from Last 1 Encounters:  05/27/17 5\' 4"  (1.626 m)   Weight:  Wt Readings from Last 1 Encounters:  05/27/17 171 lb (77.6 kg)   Wt Readings from Last 10 Encounters:  05/27/17 171 lb (77.6 kg)  05/07/17 171 lb 9.6 oz (77.8 kg)  03/20/17 169 lb 15.6 oz (77.1 kg)  03/06/17 165 lb 1.6 oz (74.9 kg)  03/03/17 170 lb (77.1 kg)  02/03/17 172 lb 6.4 oz (78.2 kg)  01/14/17 174 lb  14.4 oz (79.3 kg)  01/04/17 174 lb 2.6 oz (79 kg)  12/23/16 175 lb 9.6 oz (79.7 kg)  12/07/16 176 lb (79.8 kg)   Ideal Body Weight:  54.54 kg  BMI:  Body mass index is 29.35 kg/m.  Estimated Nutritional Needs:  Kcal:  1800-1950 (23-25 kcal/kg bw) Protein:  75-87g Pro (1.4-1.6 g/kg bw) Fluid:  1.8-2 L fluid ( 16ml/kcal)  EDUCATION NEEDS:  Education needs addressed  Burtis Junes RD, LDN, CNSC Clinical Nutrition Pager: [phone] 05/28/2017 11:27 AM

## 2017-05-28 NOTE — Progress Notes (Signed)
Pharmacy Antibiotic Note  Rebekah Henderson is a 61 y.o. female admitted on 05/27/2017 with pneumonia.  Pharmacy has been consulted for Vancomycin and Aztreonam dosing.  Plan: Vancomycin 1500mg  x 1 then 1000mg  q12h Aztreonam 1gm IV q8h Zithromax 500mg  IV q24hrs per MD Monitor labs, progress, c/s Deescalate ABX when improved / appropriate  Height: 5\' 4"  (162.6 cm) Weight: 171 lb (77.6 kg) IBW/kg (Calculated) : 54.7  Temp (24hrs), Avg:97.7 F (36.5 C), Min:97.5 F (36.4 C), Max:97.8 F (36.6 C)   Recent Labs Lab 05/27/17 1713  WBC 7.1  CREATININE 0.65    Estimated Creatinine Clearance: 72.6 mL/min (by C-G formula based on SCr of 0.65 mg/dL).    Allergies  Allergen Reactions  . Penicillins Hives, Swelling and Other (See Comments)    Reaction:  Face/mouth swelling  Has patient had a PCN reaction causing immediate rash, facial/tongue/throat swelling, SOB or lightheadedness with hypotension: Yes Has patient had a PCN reaction causing severe rash involving mucus membranes or skin necrosis: No Has patient had a PCN reaction that required hospitalization No Has patient had a PCN reaction occurring within the last 10 years: No If all of the above answers are "NO", then may proceed with Cephalosporin use.  . Codeine Nausea Only  . Ranitidine Hcl Other (See Comments)    Reaction:  Dizziness   . Keflex [Cephalexin] Other (See Comments)    Reaction:  Unknown   . Lyrica [Pregabalin] Other (See Comments)    Reaction:  Somnolence    Antimicrobials this admission: Vancomycin 6/26 >>  Cefepime 6/26 >> 6/27 Aztreonam 6/27 >> Zithromax 6/27 >>  Dose adjustments this admission:  Thank you for allowing pharmacy to be a part of this patient's care.  Hart Robinsons A 05/28/2017 9:54 AM

## 2017-05-28 NOTE — Care Management Note (Signed)
Case Management Note  Patient Details  Name: Rebekah Henderson MRN: [id number] Date of Birth: 04-21-1956  Subjective/Objective:                  Pt admitted with HCAP. She is from home, lives alone and is ind with ADL's. She has pcp, oncologist. She drives herself to appointments. She has difficulty affording or managing medications. She has no DME or HH needs. No oxygen pta. She communicates no needs.   Action/Plan: Anticipate DC home tomorrow. Pt on supplemental oxygen, this will need to be weaned prior to DC. CM will follow to DC.   Expected Discharge Date:    05/29/2017              Expected Discharge Plan:  Home/Self Care  In-House Referral:  NA  Discharge planning Services  CM Consult  Post Acute Care Choice:  NA Choice offered to:  NA  Status of Service:  Completed, signed off  Sherald Barge, RN 05/28/2017, 10:49 AM

## 2017-05-28 NOTE — Progress Notes (Signed)
ANTIBIOTIC CONSULT NOTE-Preliminary  Pharmacy Consult for Aztreonam and Vancomycin Indication: Pneumonia  Allergies  Allergen Reactions  . Penicillins Hives, Swelling and Other (See Comments)    Reaction:  Face/mouth swelling  Has patient had a PCN reaction causing immediate rash, facial/tongue/throat swelling, SOB or lightheadedness with hypotension: Yes Has patient had a PCN reaction causing severe rash involving mucus membranes or skin necrosis: No Has patient had a PCN reaction that required hospitalization No Has patient had a PCN reaction occurring within the last 10 years: No If all of the above answers are "NO", then may proceed with Cephalosporin use.  . Codeine Nausea Only  . Ranitidine Hcl Other (See Comments)    Reaction:  Dizziness   . Keflex [Cephalexin] Other (See Comments)    Reaction:  Unknown   . Lyrica [Pregabalin] Other (See Comments)    Reaction:  Somnolence     Patient Measurements: Height: 5\' 4"  (162.6 cm) Weight: 171 lb (77.6 kg) IBW/kg (Calculated) : 54.7  Vital Signs: Temp: 97.5 F (36.4 C) (06/27 0000) Temp Source: Oral (06/27 0000) BP: 127/82 (06/27 0000) Pulse Rate: 86 (06/27 0000)  Labs:  Recent Labs  05/27/17 1713  WBC 7.1  HGB 11.7*  PLT 240  CREATININE 0.65    Estimated Creatinine Clearance: 72.6 mL/min (by C-G formula based on SCr of 0.65 mg/dL).  No results for input(s): VANCOTROUGH, VANCOPEAK, VANCORANDOM, GENTTROUGH, GENTPEAK, GENTRANDOM, TOBRATROUGH, TOBRAPEAK, TOBRARND, AMIKACINPEAK, AMIKACINTROU, AMIKACIN in the last 72 hours.   Microbiology: No results found for this or any previous visit (from the past 720 hour(s)).  Medical History: Past Medical History:  Diagnosis Date  . 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 Anxiety    takes Prozac daily  . Arthritis   . Colon polyps   . COPD (chronic obstructive pulmonary disease) ([place])   . Dehydration 03/06/2017  . Diverticulitis   . Dyspnea    with exertion   . Encounter for antineoplastic chemotherapy 12/03/2016  . Fibromyalgia   . GERD (gastroesophageal reflux disease)    takes Pantoprazole daily  . Hypertension    takes Metoprolol,Triamterene-HCTZ,and Amlodipine daily  . Hypothyroidism    takes Synthroid daily  . IBS (irritable bowel syndrome)   . lung ca dx'd 10/02/2016   skin, lung  . PONV (postoperative nausea and vomiting)    pt also states that she had some difficulty breathing after cervical fusion    Medications:  Vancomycin 1500 mg IV x 1 dose, then 1000 mg every 12 hours Azithromycin 500 mg IV every 24 hours  Assessment: 61 yo female admitted on 6/26 with pneumonia. Pharmacy to dose Vancomycin and Aztreonam.  Goal of Therapy:  Vancomycin troughs 15-20 mcg/ml Eradicate infection  Plan:  Preliminary review of pertinent patient information completed.  Protocol will be initiated with one dose of Aztreonam 2 Gm IV.  Forestine Na clinical pharmacist will complete review during morning rounds to assess patient and finalize treatment regimen if needed.  Norberto Sorenson, [facility] 05/28/2017,12:15 AM

## 2017-05-29 DIAGNOSIS — R112 Nausea with vomiting, unspecified: Secondary | ICD-10-CM

## 2017-05-29 LAB — BASIC METABOLIC PANEL
Anion gap: 8 (ref 5–15)
BUN: 5 mg/dL — AB (ref 6–20)
CHLORIDE: 104 mmol/L (ref 101–111)
CO2: 27 mmol/L (ref 22–32)
CREATININE: 0.51 mg/dL (ref 0.44–1.00)
Calcium: 8.7 mg/dL — ABNORMAL LOW (ref 8.9–10.3)
GFR calc Af Amer: >60 mL/min (ref >60)
GFR calc non Af Amer: >60 mL/min (ref >60)
GLUCOSE: 111 mg/dL — AB (ref 65–99)
POTASSIUM: 3.5 mmol/L (ref 3.5–5.1)
SODIUM: 139 mmol/L (ref 135–145)

## 2017-05-29 LAB — CBC
HEMATOCRIT: 32.1 % — AB (ref 36.0–46.0)
Hemoglobin: 10.7 g/dL — ABNORMAL LOW (ref 12.0–15.0)
MCH: 31.2 pg (ref 26.0–34.0)
MCHC: 33.3 g/dL (ref 30.0–36.0)
MCV: 93.6 fL (ref 78.0–100.0)
PLATELETS: 244 10*3/uL (ref 150–400)
RBC: 3.43 MIL/uL — ABNORMAL LOW (ref 3.87–5.11)
RDW: 14.2 % (ref 11.5–15.5)
WBC: 5 10*3/uL (ref 4.0–10.5)

## 2017-05-29 MED ORDER — ONDANSETRON 4 MG PO TBDP: 4.0000 mg | ORAL_TABLET | Freq: Three times a day (TID) | ORAL | 0 refills | Status: DC | PRN

## 2017-05-29 MED ORDER — LEVOFLOXACIN 750 MG PO TABS: 750.0000 mg | ORAL_TABLET | Freq: Every day | ORAL | 0 refills | Status: DC

## 2017-05-29 MED ORDER — PREDNISONE 20 MG PO TABS: 40.0000 mg | ORAL_TABLET | Freq: Every day | ORAL | 0 refills | Status: DC

## 2017-05-29 MED ORDER — POLYETHYLENE GLYCOL 3350 17 GM/SCOOP PO POWD: 17.0000 g | Freq: Every day | ORAL | 0 refills | Status: DC | PRN

## 2017-05-29 NOTE — Care Management Important Message (Signed)
Important Message  Patient Details  Name: Rebekah Henderson MRN: [id number] Date of Birth: [birth year]   Medicare Important Message Given:  Yes    Sherald Barge, RN 05/29/2017, 9:39 AM

## 2017-05-29 NOTE — Progress Notes (Signed)
Pt IV removed, tolerated well. Discharge instructions given at bedside.

## 2017-05-29 NOTE — Discharge Summary (Signed)
Physician Discharge Summary  Rebekah Henderson ZLD:[id number] DOB: 1956-07-10 DOA: 05/27/2017  PCP: Redmond School, MD  Admit date: 05/27/2017 Discharge date: 05/29/2017  Admitted From: Home  Disposition:  Home  Recommendations for Outpatient Follow-up:  1. Follow up with Dr. Julien Nordmann at next scheduled appointment 2. Started on levofloxacin and prednisone 3. Recommend a [place]:  None  Equipment/Devices:  None   Discharge Condition:  Stable, improved CODE STATUS:  Full code  Diet recommendation:  Regular   Brief/Interim Summary:  The patient is a 61 year old female with history of COPD, small cell lung cancer who completed chemotherapy in March and completed chest radiation and brain radiation, chronic diarrhea, fibromyalgia who presented to the emergency department with nausea, vomiting, and cough.  She completed whole brain radiation about a week and half ago and after her radiation was completed she developed nausea and vomiting that persisted despite antiemetics and a trial of Decadron. She also had worsening shortness of breath with a croup-like cough. She had to increase her oxygen from nightly only to continuous for the last week. In the emergency department, a CT scan of the chest demonstrated stable lung cancer of the left mediastinum but she had some new groundglass opacifications and nodules in the right lung that could suggest multilobar pneumonia. She denied fevers, chills and did not have leukocytosis. She was started on empiric antibiotics for healthcare associated pneumonia secondary to a hospitalization in April of this year. Her symptoms marginally improved. She was given prednisone which dramatically improved her cough and shortness of breath. Her nausea and vomiting improved after starting prednisone she was given additional Zofran and was able to tolerate fluids prior to discharge from the hospital.  She met with nutritionist who advised that she take supplements and  eat a liberalize diet.  Discharge Diagnoses:  Active Problems:   COPD (chronic obstructive pulmonary disease) (HCC)   Hypertension   Hypothyroidism   Anxiety   PONV (postoperative nausea and vomiting)   Dehydration   HCAP (healthcare-associated pneumonia)  Acute on chronic respiratory failure with hypoxia secondary to healthcare associated pneumonia vs. Radiation pneumonitis. Her CT scan of the chest suggests multifocal pneumonia. She completed her chest radiation in March.  Possible that the ground glass and nodular findings on CT chest are related her to malignancy.   -   prescriptive for levofloxacin 750 mg daily 5 more days -  Prednisone 40 mg daily for 1 week -  Patient has limited finances and declined Spiriva at this time -  Continue when necessary albuterol  Acute COPD exacerbation, with wheezing and shortness of breath and increased cough, improved. Able to ambulate to and from the bathroom without dyspnea -  Steroids, antibiotics as above  Poor oral intake 1 week, oral intake improving.   -  Nutrition consultation appreciated -   continue regular diet -  Supplements -  Steroids as above -  Recommended stopping iron supplementation which makes her nauseated until she is eating better -  Continue magnesium if able  Hypothyroidism, TSH 1.257, continue synthroid  Essential hypertension, blood pressure low normal -  Continued norvasc and metoprolol -  Diuretics stopped  Fibromyalgia -   continued prn vicodin for pain  Non-small cell lung cancer, completed chemotherapy in March, completed chest radiation around the same time, completed whole brain radiation a few weeks ago.  Follow-up with Dr. Julien Nordmann at already scheduled appointment  Constipation, resolved with senna, Colace, MiraLAX, and bisacodyl suppository -  Recommended that she  take daily MiraLAX with Senokot S and use bisacodyl suppositories as needed  Anemia of chronic disease and iron deficiency -   Stopped iron supplementation for now as may be contributing to her nausea -  F/u with Dr. Julien Nordmann for repeat evaluation/labs and consideration of IV iron if unable to tolerate enteric forms  Discharge Instructions  Discharge Instructions    Diet - low sodium heart healthy    Complete by:  As directed    Increase activity slowly    Complete by:  As directed        Medication List    STOP taking these medications   ciprofloxacin 500 MG tablet Commonly known as:  CIPRO   INTEGRA PLUS Caps   triamterene-hydrochlorothiazide 37.5-25 MG tablet Commonly known as:  MAXZIDE-25     TAKE these medications   albuterol 108 (90 Base) MCG/ACT inhaler Commonly known as:  PROVENTIL HFA;VENTOLIN HFA Inhale 2 puffs into the lungs every 4 (four) hours as needed for wheezing or shortness of breath.   amLODipine 5 MG tablet Commonly known as:  NORVASC Take 5 mg by mouth daily.   dicyclomine 20 MG tablet Commonly known as:  BENTYL Take 20 mg by mouth 3 (three) times daily as needed for spasms.   estazolam 2 MG tablet Commonly known as:  PROSOM Take 2 mg by mouth at bedtime.   estradiol 2 MG tablet Commonly known as:  ESTRACE Take 2 mg by mouth daily.   FLUoxetine 40 MG capsule Commonly known as:  PROZAC Take 40 mg by mouth daily.   HYDROcodone-acetaminophen 5-325 MG tablet Commonly known as:  NORCO/VICODIN Take 1 tablet by mouth every 4 (four) hours as needed for moderate pain.   levofloxacin 750 MG tablet Commonly known as:  LEVAQUIN Take 1 tablet (750 mg total) by mouth daily.   levothyroxine 25 MCG tablet Commonly known as:  SYNTHROID, LEVOTHROID Take 25 mcg by mouth daily before breakfast.   loperamide 2 MG capsule Commonly known as:  IMODIUM Take 2 mg by mouth every 2 (two) hours as needed for diarrhea or loose stools.   magnesium oxide 400 (241.3 Mg) MG tablet Commonly known as:  MAG-OX Take 1 tablet (400 mg total) by mouth 2 (two) times daily.   metoprolol  succinate 25 MG 24 hr tablet Commonly known as:  TOPROL-XL Take 25 mg by mouth daily.   multivitamin with minerals Tabs tablet Take 2 tablets by mouth daily.   ondansetron 4 MG disintegrating tablet Commonly known as:  ZOFRAN ODT Take 1 tablet (4 mg total) by mouth every 8 (eight) hours as needed for nausea or vomiting.   pantoprazole 40 MG tablet Commonly known as:  PROTONIX Take 40 mg by mouth 2 (two) times daily before a meal.   polyethylene glycol powder powder Commonly known as:  GLYCOLAX/MIRALAX Take 17 g by mouth daily as needed for mild constipation.   potassium chloride SA 20 MEQ tablet Commonly known as:  K-DUR,KLOR-CON Take 2 tablets (40 mEq total) by mouth daily.   predniSONE 20 MG tablet Commonly known as:  DELTASONE Take 2 tablets (40 mg total) by mouth daily with breakfast. Start taking on:  05/30/2017       Allergies  Allergen Reactions  . Penicillins Hives, Swelling and Other (See Comments)    Reaction:  Face/mouth swelling  Has patient had a PCN reaction causing immediate rash, facial/tongue/throat swelling, SOB or lightheadedness with hypotension: Yes Has patient had a PCN reaction causing severe rash involving mucus  membranes or skin necrosis: No Has patient had a PCN reaction that required hospitalization No Has patient had a PCN reaction occurring within the last 10 years: No If all of the above answers are "NO", then may proceed with Cephalosporin use.  . Codeine Nausea Only  . Ranitidine Hcl Other (See Comments)    Reaction:  Dizziness   . Keflex [Cephalexin] Other (See Comments)    Reaction:  Unknown   . Lyrica [Pregabalin] Other (See Comments)    Reaction:  Somnolence     Consultations: None    Procedures/Studies: Dg Chest 2 View  Result Date: 05/27/2017 CLINICAL DATA:  Chest pain and shortness of breath EXAM: CHEST  2 VIEW COMPARISON:  CT chest 03/20/2017.  Chest x-ray 03/20/2017 FINDINGS: Interval decrease an left upper lobe opacity  likely related to evolution of postradiation change. Cardiopericardial silhouette is upper normal and stable. Retrocardiac left base shows improved aeration since prior study. Less soft tissue fullness noted in the left mediastinum/ AP window region although there is more prominent soft tissue attenuation in the right hilar region. Interstitial changes in the right lung are new in the interval. No evidence for pleural effusion. No overt pulmonary edema. IMPRESSION: Interval improvement in aeration of left lung, likely related to evolution of post treatment changes. New subtle interstitial opacity in the right upper to midline on today's exam. This is nonspecific but infectious/inflammation could produce this appearance. These changes are associated with slightly more prominent soft tissue attenuation in the right hilum. CT chest with contrast may prove helpful to further evaluate. Electronically Signed   By: Misty Stanley M.D.   On: 05/27/2017 18:39   Ct Chest W Contrast  Result Date: 05/27/2017 CLINICAL DATA:  Abnormal chest x-ray with worsened left-sided chest pain shortness of breath and productive cough EXAM: CT CHEST WITH CONTRAST TECHNIQUE: Multidetector CT imaging of the chest was performed during intravenous contrast administration. CONTRAST:  22m ISOVUE-300 IOPAMIDOL (ISOVUE-300) INJECTION 61% COMPARISON:  Radiograph 05/27/2017, CT chest 03/20/2017, PET-CT 09/27/2016 FINDINGS: Cardiovascular: Aortic atherosclerosis. Ectatic ascending aorta up to 3.7 cm. Heart size within normal limits. Small pericardial effusion, slightly increased compared to prior. Moderate hiatal hernia Mediastinum/Nodes: Midline trachea. No thyroid mass. Esophagus within normal limits. Mediastinal/AP window soft tissue mass measures 3.2 x 2.9 cm and does not appear significantly changed. There is no increasing adenopathy within the mediastinum. Lungs/Pleura: Clearing of previously noted left pleural effusion. Residual  consolidations and mild bronchiectasis in the medial left lower lobe and perihilar region, improved in appearance since the prior CT and likely related to post radiation change. Interval development of multifocal consolidations and ground-glass densities somewhat peripheral in distribution in the right upper lobe and to a lesser extent within the right middle and lower lobes. Upper Abdomen: Subcentimeter cortical hypodense lesion upper pole right kidney unchanged. No acute abnormality. Musculoskeletal: No acute or suspicious bone lesion. IMPRESSION: 1. Improved aeration of the left thorax since prior CT with residual largely paramediastinal consolidations, suspected to be secondary to post radiation change. 2. Interim development of similar appearance of density along the right paramediastinal border, however with additional multifocal consolidations and ground-glass densities greatest within the right upper and middle lobes with smaller foci in the right lower lobe ; findings could be secondary to multifocal pneumonia. 3. Grossly stable mediastinal/AP window mass 4. Small pericardial effusion, increased compared to prior study 5. Moderate hiatal hernia Aortic Atherosclerosis (ICD10-I70.0). Electronically Signed   By: KDonavan FoilM.D.   On: 05/27/2017 20:19  None   Subjective: Feeling much better. Her shortness of breath and cough has improved. Her energy is better. She was able to eat some for lunch and dinner yesterday and again for breakfast. Feels that she will be able to stay hydrated with the prednisone and additional Zofran. She had a large bowel movement. She requested information about how to prevent constipation particularly when she is taking narcotics.  Discharge Exam: Vitals:   05/28/17 2100 05/29/17 0602  BP: 113/66 134/64  Pulse: 74 64  Resp: 18 18  Temp: 98 F (36.7 C) 97.7 F (36.5 C)   Vitals:   05/28/17 1300 05/28/17 2100 05/29/17 0602 05/29/17 0843  BP: (!) 97/56 113/66  134/64   Pulse: 63 74 64   Resp: 18 18 18    Temp: 98.3 F (36.8 C) 98 F (36.7 C) 97.7 F (36.5 C)   TempSrc: Oral Oral Oral   SpO2: 91% 91% 100% 95%  Weight:      Height:        General: Pt is alert, awake, not in acute distress, alopecia Cardiovascular: RRR, S1/S2 +, no rubs, no gallops Respiratory: CTA bilaterally, no wheezing, no rhonchi Abdominal: Soft, NT, ND, bowel sounds + Extremities: no edema, no cyanosis, PIV in left forearm  The results of significant diagnostics from this hospitalization (including imaging, microbiology, ancillary and laboratory) are listed below for reference.     Microbiology: Recent Results (from the past 240 hour(s))  MRSA PCR Screening     Status: None   Collection Time: 05/28/17  3:34 PM  Result Value Ref Range Status   MRSA by PCR NEGATIVE NEGATIVE Final    Comment:        The GeneXpert MRSA Assay (FDA approved for NASAL specimens only), is one component of a comprehensive MRSA colonization surveillance program. It is not intended to diagnose MRSA infection nor to guide or monitor treatment for MRSA infections.      Labs: BNP (last 3 results)  Recent Labs  03/21/17 0719  BNP 61.4   Basic Metabolic Panel:  Recent Labs Lab 05/27/17 1713 05/29/17 0608  NA 136 139  K 3.4* 3.5  CL 99* 104  CO2 27 27  GLUCOSE 96 111*  BUN 6 5*  CREATININE 0.65 0.51  CALCIUM 8.9 8.7*   Liver Function Tests: No results for input(s): AST, ALT, ALKPHOS, BILITOT, PROT, ALBUMIN in the last 168 hours. No results for input(s): LIPASE, AMYLASE in the last 168 hours. No results for input(s): AMMONIA in the last 168 hours. CBC:  Recent Labs Lab 05/27/17 1713 05/29/17 0608  WBC 7.1 5.0  NEUTROABS 4.9  --   HGB 11.7* 10.7*  HCT 34.9* 32.1*  MCV 93.6 93.6  PLT 240 244   Cardiac Enzymes: No results for input(s): CKTOTAL, CKMB, CKMBINDEX, TROPONINI in the last 168 hours. BNP: Invalid input(s): POCBNP CBG: No results for input(s):  GLUCAP in the last 168 hours. D-Dimer No results for input(s): DDIMER in the last 72 hours. Hgb A1c No results for input(s): HGBA1C in the last 72 hours. Lipid Profile No results for input(s): CHOL, HDL, LDLCALC, TRIG, CHOLHDL, LDLDIRECT in the last 72 hours. Thyroid function studies  Recent Labs  05/27/17 1713  TSH 1.257   Anemia work up No results for input(s): VITAMINB12, FOLATE, FERRITIN, TIBC, IRON, RETICCTPCT in the last 72 hours. Urinalysis    Component Value Date/Time   COLORURINE YELLOW 05/27/2017 1753   APPEARANCEUR HAZY (A) 05/27/2017 1753   LABSPEC 1.005 05/27/2017 1753  PHURINE 7.0 05/27/2017 1753   GLUCOSEU NEGATIVE 05/27/2017 1753   HGBUR NEGATIVE 05/27/2017 1753   BILIRUBINUR NEGATIVE 05/27/2017 1753   KETONESUR NEGATIVE 05/27/2017 1753   PROTEINUR NEGATIVE 05/27/2017 1753   UROBILINOGEN 0.2 03/10/2014 1027   NITRITE NEGATIVE 05/27/2017 1753   LEUKOCYTESUR NEGATIVE 05/27/2017 1753   Sepsis Labs Invalid input(s): PROCALCITONIN,  WBC,  LACTICIDVEN   Time coordinating discharge: Over 30 minutes  SIGNED:   Janece Canterbury, MD  [facility] 05/29/2017, 12:33 PM Pager   If 7PM-7AM, please contact night-coverage [email or web address] Password [email or web address]

## 2017-05-29 NOTE — Care Management Note (Signed)
Case Management Note  Patient Details  Name: CAROLIN QUANG MRN: [id number] Date of Birth: 28-Jun-1956  Expected Discharge Date:  05/29/17               Expected Discharge Plan:  Home/Self Care  In-House Referral:  NA  Discharge planning Services  CM Consult  Post Acute Care Choice:  NA Choice offered to:  NA  Status of Service:  Completed, signed off  Additional Comments: DC home today with no needs.   Sherald Barge, RN 05/29/2017, 9:38 AM

## 2017-05-31 DIAGNOSIS — J449 Chronic obstructive pulmonary disease, unspecified: Secondary | ICD-10-CM | POA: Diagnosis not present

## 2017-05-31 DIAGNOSIS — R0902 Hypoxemia: Secondary | ICD-10-CM | POA: Diagnosis not present

## 2017-06-06 ENCOUNTER — Encounter (HOSPITAL_COMMUNITY): Payer: Self-pay

## 2017-06-06 ENCOUNTER — Other Ambulatory Visit: Payer: Medicare Other

## 2017-06-06 ENCOUNTER — Ambulatory Visit (HOSPITAL_COMMUNITY)
Admission: RE | Admit: 2017-06-06 | Discharge: 2017-06-06 | Disposition: A | Discharge status: Home / Self Care | Payer: Medicare Other | Source: Ambulatory Visit | Attending: Internal Medicine | Admitting: Internal Medicine

## 2017-06-06 ENCOUNTER — Other Ambulatory Visit (HOSPITAL_BASED_OUTPATIENT_CLINIC_OR_DEPARTMENT_OTHER): Payer: Medicare Other

## 2017-06-06 DIAGNOSIS — C3492 Malignant neoplasm of unspecified part of left bronchus or lung: Secondary | ICD-10-CM

## 2017-06-06 DIAGNOSIS — K449 Diaphragmatic hernia without obstruction or gangrene: Secondary | ICD-10-CM | POA: Diagnosis not present

## 2017-06-06 DIAGNOSIS — M797 Fibromyalgia: Secondary | ICD-10-CM

## 2017-06-06 DIAGNOSIS — Z85118 Personal history of other malignant neoplasm of bronchus and lung: Secondary | ICD-10-CM | POA: Diagnosis not present

## 2017-06-06 DIAGNOSIS — J449 Chronic obstructive pulmonary disease, unspecified: Secondary | ICD-10-CM | POA: Diagnosis present

## 2017-06-06 DIAGNOSIS — I700 Atherosclerosis of aorta: Secondary | ICD-10-CM | POA: Insufficient documentation

## 2017-06-06 DIAGNOSIS — C3432 Malignant neoplasm of lower lobe, left bronchus or lung: Secondary | ICD-10-CM | POA: Diagnosis not present

## 2017-06-06 LAB — CBC WITH DIFFERENTIAL/PLATELET
BASO%: 0.3 % (ref 0.0–2.0)
BASOS ABS: 0 10*3/uL (ref 0.0–0.1)
EOS%: 0.3 % (ref 0.0–7.0)
Eosinophils Absolute: 0 10*3/uL (ref 0.0–0.5)
HEMATOCRIT: 41 % (ref 34.8–46.6)
HEMOGLOBIN: 13.8 g/dL (ref 11.6–15.9)
LYMPH#: 0.6 10*3/uL — AB (ref 0.9–3.3)
LYMPH%: 9.7 % — ABNORMAL LOW (ref 14.0–49.7)
MCH: 31.7 pg (ref 25.1–34.0)
MCHC: 33.7 g/dL (ref 31.5–36.0)
MCV: 94.3 fL (ref 79.5–101.0)
MONO#: 0.2 10*3/uL (ref 0.1–0.9)
MONO%: 2.5 % (ref 0.0–14.0)
NEUT#: 5.7 10*3/uL (ref 1.5–6.5)
NEUT%: 87.2 % — ABNORMAL HIGH (ref 38.4–76.8)
Platelets: 294 10*3/uL (ref 145–400)
RBC: 4.35 10*6/uL (ref 3.70–5.45)
RDW: 16.1 % — AB (ref 11.2–14.5)
WBC: 6.5 10*3/uL (ref 3.9–10.3)

## 2017-06-06 LAB — COMPREHENSIVE METABOLIC PANEL
ALBUMIN: 3.4 g/dL — AB (ref 3.5–5.0)
ALT: 12 U/L (ref 0–55)
AST: 12 U/L (ref 5–34)
Alkaline Phosphatase: 42 U/L (ref 40–150)
Anion Gap: 11 mEq/L (ref 3–11)
BUN: 11.9 mg/dL (ref 7.0–26.0)
CALCIUM: 9.4 mg/dL (ref 8.4–10.4)
CHLORIDE: 101 meq/L (ref 98–109)
CO2: 27 mEq/L (ref 22–29)
CREATININE: 0.8 mg/dL (ref 0.6–1.1)
EGFR: 82 mL/min/{1.73_m2} — ABNORMAL LOW (ref >90)
GLUCOSE: 111 mg/dL (ref 70–140)
POTASSIUM: 4.2 meq/L (ref 3.5–5.1)
SODIUM: 139 meq/L (ref 136–145)
Total Bilirubin: 0.48 mg/dL (ref 0.20–1.20)
Total Protein: 6.7 g/dL (ref 6.4–8.3)

## 2017-06-06 LAB — TECHNOLOGIST REVIEW

## 2017-06-06 MED ORDER — IOPAMIDOL (ISOVUE-300) INJECTION 61%
75.0000 mL | Freq: Once | INTRAVENOUS | Status: AC | PRN
  Administered 2017-06-06: 75 mL via INTRAVENOUS

## 2017-06-06 MED ORDER — IOPAMIDOL (ISOVUE-300) INJECTION 61%
INTRAVENOUS | Status: AC
  Administered 2017-06-06: 75 mL via INTRAVENOUS
  Filled 2017-06-06: qty 75

## 2017-06-09 ENCOUNTER — Encounter: Payer: Self-pay | Admitting: Internal Medicine

## 2017-06-09 ENCOUNTER — Ambulatory Visit (HOSPITAL_BASED_OUTPATIENT_CLINIC_OR_DEPARTMENT_OTHER): Payer: Medicare Other | Admitting: Internal Medicine

## 2017-06-09 VITALS — BP 133/73 | HR 68 | Temp 97.5°F | Resp 20 | Ht 64.0 in | Wt 169.0 lb

## 2017-06-09 DIAGNOSIS — J449 Chronic obstructive pulmonary disease, unspecified: Secondary | ICD-10-CM | POA: Diagnosis not present

## 2017-06-09 DIAGNOSIS — C3492 Malignant neoplasm of unspecified part of left bronchus or lung: Secondary | ICD-10-CM

## 2017-06-09 DIAGNOSIS — E039 Hypothyroidism, unspecified: Secondary | ICD-10-CM | POA: Diagnosis not present

## 2017-06-09 DIAGNOSIS — J189 Pneumonia, unspecified organism: Secondary | ICD-10-CM | POA: Diagnosis not present

## 2017-06-09 DIAGNOSIS — D649 Anemia, unspecified: Secondary | ICD-10-CM | POA: Diagnosis not present

## 2017-06-09 DIAGNOSIS — C3432 Malignant neoplasm of lower lobe, left bronchus or lung: Secondary | ICD-10-CM

## 2017-06-09 DIAGNOSIS — Z6829 Body mass index (BMI) 29.0-29.9, adult: Secondary | ICD-10-CM | POA: Diagnosis not present

## 2017-06-09 DIAGNOSIS — J9600 Acute respiratory failure, unspecified whether with hypoxia or hypercapnia: Secondary | ICD-10-CM | POA: Diagnosis not present

## 2017-06-09 NOTE — Progress Notes (Signed)
[place] [phone]   [phone]  OFFICE PROGRESS NOTE  [employer], MD [address] [place] [address]  DIAGNOSIS: Limited stage (T1b, N2, M0) small cell lung cancer presented with left lower lobe/infrahilar mass and large mediastinal lymphadenopathy diagnosed in October 2017.  PRIOR THERAPY: 1) Systemic chemotherapy with cisplatin 60 MG/M2 on day 1 and etoposide 120 MG/M2 on days 1, 2 and 3 status post 1 cycle. This was discontinued secondary to intolerance. 2) Systemic chemotherapy with carboplatin for AUC of 4 on day 1 and etoposide 100 MG/M2 on days 1, 2 and 3 with Neulasta support on day 4 every 3 weeks. Status post 5 cycles. This was concurrent with radiation in [place], [place]. 3) prophylactic cranial irradiation.  CURRENT THERAPY: Observation.  INTERVAL HISTORY: Rebekah Henderson 61 y.o. female returns to the clinic today for follow-up visit accompanied by her friend. The patient is feeling fine today with no specific complaints. She denied having any weight loss or night sweats. She denied having any chest pain, shortness of breath, cough or hemoptysis. She has no fever or chills. She has no nausea, vomiting, diarrhea or constipation. She had repeat CT scan of the chest performed recently and she is here for evaluation and discussion of her scan results.   MEDICAL HISTORY: Past Medical History:  Diagnosis Date  . 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 Anxiety    takes Prozac daily  . Arthritis   . Colon polyps   . COPD (chronic obstructive pulmonary disease) ([place])   . Dehydration 03/06/2017  . Diverticulitis   . Dyspnea    with exertion  . Encounter for antineoplastic chemotherapy 12/03/2016  . Fibromyalgia   . GERD (gastroesophageal reflux disease)    takes Pantoprazole daily  . Hypertension    takes Metoprolol,Triamterene-HCTZ,and Amlodipine daily  . Hypothyroidism    takes Synthroid daily  .  IBS (irritable bowel syndrome)   . lung ca dx'd 10/02/2016   skin, lung  . PONV (postoperative nausea and vomiting)    pt also states that she had some difficulty breathing after cervical fusion    ALLERGIES:  is allergic to penicillins; codeine; ranitidine hcl; keflex [cephalexin]; and lyrica [pregabalin].  MEDICATIONS:  Current Outpatient Prescriptions  Medication Sig Dispense Refill  . albuterol (PROVENTIL HFA;VENTOLIN HFA) 108 (90 Base) MCG/ACT inhaler Inhale 2 puffs into the lungs every 4 (four) hours as needed for wheezing or shortness of breath.    Marland Kitchen amLODipine (NORVASC) 5 MG tablet Take 5 mg by mouth daily.      Marland Kitchen dicyclomine (BENTYL) 20 MG tablet Take 20 mg by mouth 3 (three) times daily as needed for spasms.    Marland Kitchen estazolam (PROSOM) 2 MG tablet Take 2 mg by mouth at bedtime.    Marland Kitchen estradiol (ESTRACE) 2 MG tablet Take 2 mg by mouth daily.      Marland Kitchen FLUoxetine (PROZAC) 40 MG capsule Take 40 mg by mouth daily.   2  . HYDROcodone-acetaminophen (NORCO/VICODIN) 5-325 MG tablet Take 1 tablet by mouth every 4 (four) hours as needed for moderate pain.   0  . levothyroxine (SYNTHROID, LEVOTHROID) 25 MCG tablet Take 25 mcg by mouth daily before breakfast.     . loperamide (IMODIUM) 2 MG capsule Take 2 mg by mouth every 2 (two) hours as needed for diarrhea or loose stools.    . metoprolol succinate (TOPROL-XL) 25 MG 24 hr tablet Take 25 mg by mouth daily.    Marland Kitchen  Multiple Vitamin (MULTIVITAMIN WITH MINERALS) TABS tablet Take 2 tablets by mouth daily.    . ondansetron (ZOFRAN ODT) 4 MG disintegrating tablet Take 1 tablet (4 mg total) by mouth every 8 (eight) hours as needed for nausea or vomiting. 20 tablet 0  . pantoprazole (PROTONIX) 40 MG tablet Take 40 mg by mouth 2 (two) times daily before a meal.   10  . polyethylene glycol powder (GLYCOLAX/MIRALAX) powder Take 17 g by mouth daily as needed for mild constipation. 255 g 0   No current facility-administered medications for this visit.      SURGICAL HISTORY:  Past Surgical History:  Procedure Laterality Date  . BIOPSY N/A 05/25/2013   Procedure: BIOPSIES (Random Colon; Duodenal; Gastric);  Surgeon: Danie Binder, MD;  Location: [facility];  Service: Endoscopy;  Laterality: N/A;  . BLADDER SUSPENSION    . BREAST ENHANCEMENT SURGERY    . BREAST IMPLANT REMOVAL    . CERVICAL FUSION  AUG 2013  . CHOLECYSTECTOMY  1999  . COLONOSCOPY  2007 [place]   POLYPS  . COLONOSCOPY WITH PROPOFOL N/A 05/25/2013   Procedure: COLONOSCOPY WITH PROPOFOL(at cecum 0957) total withdrawal time=72min);  Surgeon: Danie Binder, MD;  Location: [facility];  Service: Endoscopy;  Laterality: N/A;  . ESOPHAGOGASTRODUODENOSCOPY (EGD) WITH PROPOFOL N/A 05/25/2013   Procedure: ESOPHAGOGASTRODUODENOSCOPY (EGD) WITH PROPOFOL;  Surgeon: Danie Binder, MD;  Location: [facility];  Service: Endoscopy;  Laterality: N/A;  . FOOT SURGERY    . POLYPECTOMY N/A 05/25/2013   Procedure: POLYPECTOMY (Rectal and Gastric);  Surgeon: Danie Binder, MD;  Location: [facility];  Service: Endoscopy;  Laterality: N/A;  . TONSILLECTOMY    . UPPER GASTROINTESTINAL ENDOSCOPY    . VIDEO BRONCHOSCOPY WITH ENDOBRONCHIAL ULTRASOUND  09/12/2016   Procedure: VIDEO BRONCHOSCOPY WITH ENDOBRONCHIAL ULTRASOUND AND BIOPSY;  Surgeon: Juanito Doom, MD;  Location: [facility];  Service: Cardiopulmonary;;    REVIEW OF SYSTEMS:  A comprehensive review of systems was negative.   PHYSICAL EXAMINATION: General appearance: alert, cooperative and no distress Head: Normocephalic, without obvious abnormality, atraumatic Neck: no adenopathy, no JVD, supple, symmetrical, trachea midline and thyroid not enlarged, symmetric, no tenderness/mass/nodules Lymph nodes: Cervical, supraclavicular, and axillary nodes normal. Resp: clear to auscultation bilaterally Back: symmetric, no curvature. ROM normal. No CVA tenderness. Cardio: regular rate and rhythm, S1, S2 normal, no murmur, click, rub or gallop GI: soft, non-tender;  bowel sounds normal; no masses,  no organomegaly Extremities: extremities normal, atraumatic, no cyanosis or edema   ECOG PERFORMANCE STATUS: 1 - Symptomatic but completely ambulatory  Blood pressure 133/73, pulse 68, temperature (!) 97.5 F (36.4 C), temperature source Oral, resp. rate 20, height 5\' 4"  (1.626 m), weight 169 lb (76.7 kg), SpO2 94 %.  LABORATORY DATA: Lab Results  Component Value Date   WBC 6.5 06/06/2017   HGB 13.8 06/06/2017   HCT 41.0 06/06/2017   MCV 94.3 06/06/2017   PLT 294 06/06/2017      Chemistry      Component Value Date/Time   NA 139 06/06/2017 1041   K 4.2 06/06/2017 1041   CL 104 05/29/2017 0608   CO2 27 06/06/2017 1041   BUN 11.9 06/06/2017 1041   CREATININE 0.8 06/06/2017 1041      Component Value Date/Time   CALCIUM 9.4 06/06/2017 1041   ALKPHOS 42 06/06/2017 1041   AST 12 06/06/2017 1041   ALT 12 06/06/2017 1041   BILITOT 0.48 06/06/2017 1041       RADIOGRAPHIC STUDIES:  Dg Chest 2 View  Result Date: 05/27/2017 CLINICAL DATA:  Chest pain and shortness of breath EXAM: CHEST  2 VIEW COMPARISON:  CT chest 03/20/2017.  Chest x-ray 03/20/2017 FINDINGS: Interval decrease an left upper lobe opacity likely related to evolution of postradiation change. Cardiopericardial silhouette is upper normal and stable. Retrocardiac left base shows improved aeration since prior study. Less soft tissue fullness noted in the left mediastinum/ AP window region although there is more prominent soft tissue attenuation in the right hilar region. Interstitial changes in the right lung are new in the interval. No evidence for pleural effusion. No overt pulmonary edema. IMPRESSION: Interval improvement in aeration of left lung, likely related to evolution of post treatment changes. New subtle interstitial opacity in the right upper to midline on today's exam. This is nonspecific but infectious/inflammation could produce this appearance. These changes are associated with  slightly more prominent soft tissue attenuation in the right hilum. CT chest with contrast may prove helpful to further evaluate. Electronically Signed   By: Misty Stanley M.D.   On: 05/27/2017 18:39   Ct Chest W Contrast  Result Date: 06/06/2017 CLINICAL DATA:  61 year old female with history of lung cancer diagnosed in October 2017 status post chemotherapy and radiation therapy now complete. Chronic shortness of breath. History of COPD. Followup study. EXAM: CT CHEST WITH CONTRAST TECHNIQUE: Multidetector CT imaging of the chest was performed during intravenous contrast administration. CONTRAST:  75 mL of Isovue-300. COMPARISON:  Multiple priors, most recently 05/27/2017. FINDINGS: Cardiovascular: Heart size is normal. There is no significant pericardial fluid, thickening or pericardial calcification. Atherosclerosis in the thoracic aorta. No definite coronary artery calcifications are identified. Mediastinum/Nodes: There continues to be amorphous soft tissue in the AP window and low left paratracheal nodal stations which is confluent and measures approximately 3.3 x 2.5 cm, slightly smaller than prior study 05/27/2017. No other definite mediastinal or hilar lymphadenopathy. Moderate sized hiatal hernia. No axillary lymphadenopathy. Lungs/Pleura: There continue to be nodular and mass-like areas of architectural distortion in the paramediastinal aspect of both lungs, most evident in the left upper lobe lateral to the aortic arch, medial aspect of the right upper lobe and in the medial aspect of the left lower lobe. Overall, these areas have decreased in size compared to the prior study, and are most compatible with evolving areas of postradiation mass-like fibrosis. Compared to the prior study the additional areas of ground-glass attenuation and airspace consolidation seen throughout the lungs have further regressed, most compatible with resolving areas of postradiation pneumonitis. No new suspicious appearing  pulmonary nodules or masses. Trace left pleural effusion. No right pleural effusion. Upper Abdomen: 1 cm simple cyst in the upper pole the right kidney. Aortic atherosclerosis. Musculoskeletal: Orthopedic fixation hardware in the lower cervical spine. There are no aggressive appearing lytic or blastic lesions noted in the visualized portions of the skeleton. IMPRESSION: 1. Further regression of postradiation changes in the lungs when compared to the recent prior examination. Previously noted nodal mass in the AP window has also slightly regressed. No findings to suggest new metastatic disease in the thorax. 2. Moderate-sized hiatal hernia. 3. Aortic atherosclerosis. Aortic Atherosclerosis (ICD10-I70.0). Electronically Signed   By: Vinnie Langton M.D.   On: 06/06/2017 13:07   Ct Chest W Contrast  Result Date: 05/27/2017 CLINICAL DATA:  Abnormal chest x-ray with worsened left-sided chest pain shortness of breath and productive cough EXAM: CT CHEST WITH CONTRAST TECHNIQUE: Multidetector CT imaging of the chest was performed during intravenous contrast administration. CONTRAST:  10mL ISOVUE-300 IOPAMIDOL (ISOVUE-300) INJECTION 61% COMPARISON:  Radiograph 05/27/2017, CT chest 03/20/2017, PET-CT 09/27/2016 FINDINGS: Cardiovascular: Aortic atherosclerosis. Ectatic ascending aorta up to 3.7 cm. Heart size within normal limits. Small pericardial effusion, slightly increased compared to prior. Moderate hiatal hernia Mediastinum/Nodes: Midline trachea. No thyroid mass. Esophagus within normal limits. Mediastinal/AP window soft tissue mass measures 3.2 x 2.9 cm and does not appear significantly changed. There is no increasing adenopathy within the mediastinum. Lungs/Pleura: Clearing of previously noted left pleural effusion. Residual consolidations and mild bronchiectasis in the medial left lower lobe and perihilar region, improved in appearance since the prior CT and likely related to post radiation change. Interval  development of multifocal consolidations and ground-glass densities somewhat peripheral in distribution in the right upper lobe and to a lesser extent within the right middle and lower lobes. Upper Abdomen: Subcentimeter cortical hypodense lesion upper pole right kidney unchanged. No acute abnormality. Musculoskeletal: No acute or suspicious bone lesion. IMPRESSION: 1. Improved aeration of the left thorax since prior CT with residual largely paramediastinal consolidations, suspected to be secondary to post radiation change. 2. Interim development of similar appearance of density along the right paramediastinal border, however with additional multifocal consolidations and ground-glass densities greatest within the right upper and middle lobes with smaller foci in the right lower lobe ; findings could be secondary to multifocal pneumonia. 3. Grossly stable mediastinal/AP window mass 4. Small pericardial effusion, increased compared to prior study 5. Moderate hiatal hernia Aortic Atherosclerosis (ICD10-I70.0). Electronically Signed   By: Donavan Foil M.D.   On: 05/27/2017 20:19    ASSESSMENT AND PLAN:  This is a very pleasant 61 years old white female was limited stage small cell lung cancer, status post 6 cycles of systemic chemotherapy with carboplatin and etoposide concurrent with radiation and followed by prophylactic cranial irradiation. The patient is feeling fine today with no specific complaints. She had repeat CT scan of the chest performed recently. I personally and independently reviewed the scan results and discussed with the patient today. I recommended for the patient to continue on observation with repeat CT scan of the chest in 3 months. She was advised to call immediately if she has any concerning symptoms in the interval. The patient voices understanding of current disease status and treatment options and is in agreement with the current care plan. All questions were answered. The patient  knows to call the clinic with any problems, questions or concerns. We can certainly see the patient much sooner if necessary. I spent 10 minutes counseling the patient face to face. The total time spent in the appointment was 15 minutes.  Disclaimer: This note was dictated with voice recognition software. Similar sounding words can inadvertently be transcribed and may not be corrected upon review.

## 2017-06-19 DIAGNOSIS — C3412 Malignant neoplasm of upper lobe, left bronchus or lung: Secondary | ICD-10-CM | POA: Diagnosis not present

## 2017-06-19 DIAGNOSIS — C7931 Secondary malignant neoplasm of brain: Secondary | ICD-10-CM | POA: Diagnosis not present

## 2017-06-25 ENCOUNTER — Other Ambulatory Visit (HOSPITAL_COMMUNITY): Payer: Self-pay | Admitting: Radiation Oncology

## 2017-06-25 DIAGNOSIS — C7931 Secondary malignant neoplasm of brain: Secondary | ICD-10-CM

## 2017-06-30 ENCOUNTER — Telehealth: Payer: Self-pay | Admitting: Medical Oncology

## 2017-06-30 NOTE — Telephone Encounter (Signed)
Returned call . Pt reports multiple symptoms for a week.  Pt reports intermittent nausea,dizziness and pain in her chest and ribs when she takes a deep breath.. She took 2 zofran and 2 imodium today.  She is having headaches off and on since finishing xrt. She takes Vicodin qd and it helps. She has more   good days and bad days. Note to [place].

## 2017-07-01 DIAGNOSIS — R0902 Hypoxemia: Secondary | ICD-10-CM | POA: Diagnosis not present

## 2017-07-01 DIAGNOSIS — J449 Chronic obstructive pulmonary disease, unspecified: Secondary | ICD-10-CM | POA: Diagnosis not present

## 2017-07-04 ENCOUNTER — Telehealth: Payer: Self-pay | Admitting: *Deleted

## 2017-07-04 NOTE — Telephone Encounter (Signed)
Voicemail request for provider "opinion or response to symptoms I called in day before yesterday about having diarrhea.  Still nauseated, feeling weak.  Do I need blood work?  Please call."  Sounds well.  "I feel better now.  Took imodium last night so no BM today.  I just took two Zofran.  Earlier this morning I threw up bile colored fluid.  Use Hydrocodone for my stomach when I hurt.  Six to eight ounces in so far today.  I drink taking medicines. Lips are dry."  Encouraged to go to the ED as soon as possible if symptoms return, are uncontrollable feeling bad again.  Reviewed eating soups, pop sickles, [place] Ices, [place] sports drinks, soft foods.  No questions.  Again encouraged not to wait through weekend if not feeling well.

## 2017-07-07 ENCOUNTER — Encounter (HOSPITAL_COMMUNITY): Payer: Self-pay | Admitting: Emergency Medicine

## 2017-07-07 ENCOUNTER — Emergency Department (HOSPITAL_COMMUNITY): Payer: Medicare Other

## 2017-07-07 ENCOUNTER — Emergency Department (HOSPITAL_COMMUNITY)
Admission: EM | Admit: 2017-07-07 | Discharge: 2017-07-07 | Disposition: A | Discharge status: Home / Self Care | Payer: Medicare Other | Attending: Emergency Medicine | Admitting: Emergency Medicine

## 2017-07-07 DIAGNOSIS — Z87891 Personal history of nicotine dependence: Secondary | ICD-10-CM | POA: Diagnosis not present

## 2017-07-07 DIAGNOSIS — R197 Diarrhea, unspecified: Secondary | ICD-10-CM | POA: Insufficient documentation

## 2017-07-07 DIAGNOSIS — R110 Nausea: Secondary | ICD-10-CM | POA: Diagnosis not present

## 2017-07-07 DIAGNOSIS — R0789 Other chest pain: Secondary | ICD-10-CM | POA: Diagnosis present

## 2017-07-07 DIAGNOSIS — J181 Lobar pneumonia, unspecified organism: Secondary | ICD-10-CM | POA: Diagnosis not present

## 2017-07-07 DIAGNOSIS — R079 Chest pain, unspecified: Secondary | ICD-10-CM | POA: Diagnosis not present

## 2017-07-07 DIAGNOSIS — R42 Dizziness and giddiness: Secondary | ICD-10-CM | POA: Diagnosis not present

## 2017-07-07 DIAGNOSIS — Z79899 Other long term (current) drug therapy: Secondary | ICD-10-CM | POA: Insufficient documentation

## 2017-07-07 DIAGNOSIS — Z85118 Personal history of other malignant neoplasm of bronchus and lung: Secondary | ICD-10-CM | POA: Insufficient documentation

## 2017-07-07 DIAGNOSIS — R9431 Abnormal electrocardiogram [ECG] [EKG]: Secondary | ICD-10-CM | POA: Diagnosis not present

## 2017-07-07 DIAGNOSIS — I10 Essential (primary) hypertension: Secondary | ICD-10-CM | POA: Insufficient documentation

## 2017-07-07 DIAGNOSIS — J449 Chronic obstructive pulmonary disease, unspecified: Secondary | ICD-10-CM | POA: Diagnosis not present

## 2017-07-07 DIAGNOSIS — J168 Pneumonia due to other specified infectious organisms: Secondary | ICD-10-CM | POA: Diagnosis not present

## 2017-07-07 DIAGNOSIS — J189 Pneumonia, unspecified organism: Secondary | ICD-10-CM

## 2017-07-07 LAB — POCT I-STAT TROPONIN I
Troponin i, poc: 0 ng/mL (ref 0.00–0.08)
Troponin i, poc: 0 ng/mL (ref 0.00–0.08)

## 2017-07-07 LAB — BASIC METABOLIC PANEL
ANION GAP: 11 (ref 5–15)
BUN: 6 mg/dL (ref 6–20)
CO2: 27 mmol/L (ref 22–32)
Calcium: 9.6 mg/dL (ref 8.9–10.3)
Chloride: 103 mmol/L (ref 101–111)
Creatinine, Ser: 0.8 mg/dL (ref 0.44–1.00)
GLUCOSE: 105 mg/dL — AB (ref 65–99)
POTASSIUM: 3.5 mmol/L (ref 3.5–5.1)
Sodium: 141 mmol/L (ref 135–145)

## 2017-07-07 LAB — CBC
HEMATOCRIT: 40.1 % (ref 36.0–46.0)
HEMOGLOBIN: 13.9 g/dL (ref 12.0–15.0)
MCH: 32 pg (ref 26.0–34.0)
MCHC: 34.7 g/dL (ref 30.0–36.0)
MCV: 92.4 fL (ref 78.0–100.0)
Platelets: 215 10*3/uL (ref 150–400)
RBC: 4.34 MIL/uL (ref 3.87–5.11)
RDW: 14.4 % (ref 11.5–15.5)
WBC: 7 10*3/uL (ref 4.0–10.5)

## 2017-07-07 MED ORDER — IOPAMIDOL (ISOVUE-370) INJECTION 76%
INTRAVENOUS | Status: AC
  Filled 2017-07-07: qty 100

## 2017-07-07 MED ORDER — LEVOFLOXACIN 750 MG PO TABS
750.0000 mg | ORAL_TABLET | Freq: Once | ORAL | Status: AC
  Administered 2017-07-07: 750 mg via ORAL
  Filled 2017-07-07: qty 1

## 2017-07-07 MED ORDER — LEVOFLOXACIN 750 MG PO TABS: 750.0000 mg | ORAL_TABLET | Freq: Every day | ORAL | 0 refills | Status: DC

## 2017-07-07 MED ORDER — ONDANSETRON HCL 4 MG/2ML IJ SOLN
4.0000 mg | Freq: Once | INTRAMUSCULAR | Status: AC
  Administered 2017-07-07: 4 mg via INTRAVENOUS
  Filled 2017-07-07: qty 2

## 2017-07-07 MED ORDER — IOPAMIDOL (ISOVUE-370) INJECTION 76%
100.0000 mL | Freq: Once | INTRAVENOUS | Status: AC | PRN
  Administered 2017-07-07: 100 mL via INTRAVENOUS

## 2017-07-07 MED ORDER — OXYCODONE-ACETAMINOPHEN 5-325 MG PO TABS
1.0000 | ORAL_TABLET | Freq: Once | ORAL | Status: AC
  Administered 2017-07-07: 1 via ORAL
  Filled 2017-07-07: qty 1

## 2017-07-07 NOTE — ED Triage Notes (Signed)
Pt c/o worsening of ongoing CP, nausea, diarrhea; and new onset dizziness, epigastric pressure x 2 weeks. Hx lung cancer.

## 2017-07-07 NOTE — ED Provider Notes (Signed)
[facility] DEPT Provider Note   CSN: [id number] Arrival date & time: 07/07/17  1023     History   Chief Complaint Chief Complaint  Patient presents with  . Chest Pain  . Diarrhea    HPI Rebekah Henderson is a 61 y.o. female.  61yo F w/ PMH including small cell lung CA, fibromyalgia, IBS, HTN, COPD who p/w fatigue and chest pain. Pt has chronic nausea with occasional vomiting and chronic non-bloody diarrhea that waxes and wanes. She took immodium recently which improved the diarrhea. She has had chest pain particularly with deep breathing since she was diagnosed with cancer last year. For the past few weeks, she has had generalized weakness and her chest pain has seemed worse than usual. She feels like she has had fevers but hasn't checked; she has had night sweats. She also reports increased shortness of breath recently. She uses oxygen at night and has not had to increase it recently. No increased inhaler use. No urinary symptoms or bloody stools. No sick contacts.    Chest Pain    Diarrhea      Past Medical History:  Diagnosis Date  . 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 Anxiety    takes Prozac daily  . Arthritis   . Colon polyps   . COPD (chronic obstructive pulmonary disease) ([place])   . Dehydration 03/06/2017  . Diverticulitis   . Dyspnea    with exertion  . Encounter for antineoplastic chemotherapy 12/03/2016  . Fibromyalgia   . GERD (gastroesophageal reflux disease)    takes Pantoprazole daily  . Hypertension    takes Metoprolol,Triamterene-HCTZ,and Amlodipine daily  . Hypothyroidism    takes Synthroid daily  . IBS (irritable bowel syndrome)   . lung ca dx'd 10/02/2016   skin, lung  . PONV (postoperative nausea and vomiting)    pt also states that she had some difficulty breathing after cervical fusion    Patient Active Problem List   Diagnosis Date Noted  . Acute on chronic respiratory failure with hypoxia ([place]) 03/21/2017  . Mild  protein-calorie malnutrition ([place]) 03/21/2017  . HCAP (healthcare-associated pneumonia) 03/20/2017  . Dehydration 03/06/2017  . Symptomatic anemia 01/04/2017  . Thrombocytopenia ([place]) 01/04/2017  . Influenza A 12/10/2016  . SIRS (systemic inflammatory response syndrome) ([place]) 12/07/2016  . Hypoxia 12/07/2016  . Bronchitis 12/07/2016  . Encounter for antineoplastic chemotherapy 12/03/2016  . 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 Protein-calorie malnutrition, severe 10/30/2016  . Vomiting 10/29/2016  . Abdominal pain 10/29/2016  . Rash and nonspecific skin eruption 10/29/2016  . Neutropenic fever ([place]) 10/15/2016  . Intractable nausea and vomiting 10/15/2016  . Pancytopenia due to antineoplastic chemotherapy ([place]) 10/15/2016  . Nodular rash 10/15/2016  . Hypokalemia 10/15/2016  . Hypomagnesemia 10/15/2016  . Chronic respiratory failure with hypoxia ([place]) 10/15/2016  . Small cell lung cancer, left ([place]) 09/19/2016  . Mediastinal mass 09/10/2016  . COPD (chronic obstructive pulmonary disease) ([place])   . Hypertension   . Fibromyalgia   . IBS (irritable bowel syndrome)   . GERD (gastroesophageal reflux disease)   . Hypothyroidism   . Colon polyps   . Anxiety   . PONV (postoperative nausea and vomiting)   . Diarrhea 05/12/2013    Past Surgical History:  Procedure Laterality Date  . BIOPSY N/A 05/25/2013   Procedure: BIOPSIES (Random Colon; Duodenal; Gastric);  Surgeon: Danie Binder, MD;  Location: [facility];  Service: Endoscopy;  Laterality: N/A;  . BLADDER SUSPENSION    . BREAST  ENHANCEMENT SURGERY    . BREAST IMPLANT REMOVAL    . CERVICAL FUSION  AUG 2013  . CHOLECYSTECTOMY  1999  . COLONOSCOPY  2007 [place]   POLYPS  . COLONOSCOPY WITH PROPOFOL N/A 05/25/2013   Procedure: COLONOSCOPY WITH PROPOFOL(at cecum 0957) total withdrawal time=75min);  Surgeon: Danie Binder, MD;  Location: [facility];  Service: Endoscopy;  Laterality: N/A;  . ESOPHAGOGASTRODUODENOSCOPY  (EGD) WITH PROPOFOL N/A 05/25/2013   Procedure: ESOPHAGOGASTRODUODENOSCOPY (EGD) WITH PROPOFOL;  Surgeon: Danie Binder, MD;  Location: [facility];  Service: Endoscopy;  Laterality: N/A;  . FOOT SURGERY    . POLYPECTOMY N/A 05/25/2013   Procedure: POLYPECTOMY (Rectal and Gastric);  Surgeon: Danie Binder, MD;  Location: [facility];  Service: Endoscopy;  Laterality: N/A;  . TONSILLECTOMY    . UPPER GASTROINTESTINAL ENDOSCOPY    . VIDEO BRONCHOSCOPY WITH ENDOBRONCHIAL ULTRASOUND  09/12/2016   Procedure: VIDEO BRONCHOSCOPY WITH ENDOBRONCHIAL ULTRASOUND AND BIOPSY;  Surgeon: Juanito Doom, MD;  Location: [facility];  Service: Cardiopulmonary;;    OB History    No data available       Home Medications    Prior to Admission medications   Medication Sig Start Date End Date Taking? Authorizing Provider  albuterol (PROVENTIL HFA;VENTOLIN HFA) 108 (90 Base) MCG/ACT inhaler Inhale 2 puffs into the lungs every 4 (four) hours as needed for wheezing or shortness of breath.   Yes [provider]  amLODipine (NORVASC) 5 MG tablet Take 5 mg by mouth daily.     Yes [provider]  dicyclomine (BENTYL) 20 MG tablet Take 20 mg by mouth 3 (three) times daily as needed for spasms.   Yes [provider]  estazolam (PROSOM) 2 MG tablet Take 2 mg by mouth at bedtime.   Yes [provider]  estradiol (ESTRACE) 2 MG tablet Take 2 mg by mouth daily.     Yes [provider]  FLUoxetine (PROZAC) 40 MG capsule Take 40 mg by mouth daily.    Yes [provider]  HYDROcodone-acetaminophen (NORCO/VICODIN) 5-325 MG tablet Take 1 tablet by mouth every 4 (four) hours as needed for moderate pain.    Yes [provider]  levothyroxine (SYNTHROID, LEVOTHROID) 25 MCG tablet Take 25 mcg by mouth daily before breakfast.    Yes [provider]  loperamide (IMODIUM) 2 MG capsule Take 2 mg by mouth every 2 (two) hours as needed for diarrhea or loose stools.   Yes  [provider]  metoprolol succinate (TOPROL-XL) 25 MG 24 hr tablet Take 25 mg by mouth daily.   Yes [provider]  Multiple Vitamin (MULTIVITAMIN WITH MINERALS) TABS tablet Take 2 tablets by mouth daily.   Yes [provider]  ondansetron (ZOFRAN ODT) 4 MG disintegrating tablet Take 1 tablet (4 mg total) by mouth every 8 (eight) hours as needed for nausea or vomiting. 05/29/17  Yes Short, Noah Delaine, MD  pantoprazole (PROTONIX) 40 MG tablet Take 40 mg by mouth 2 (two) times daily before a meal.    Yes [provider]  polyethylene glycol powder (GLYCOLAX/MIRALAX) powder Take 17 g by mouth daily as needed for mild constipation. 05/29/17  Yes Short, Noah Delaine, MD  promethazine (PHENERGAN) 25 MG tablet Take 25 mg by mouth every 6 (six) hours as needed for nausea/vomiting. 07/05/17  Yes [provider]  levofloxacin (LEVAQUIN) 750 MG tablet Take 1 tablet (750 mg total) by mouth daily. 07/07/17   George Haggart, Wenda Overland, MD    Family  History Family History  Problem Relation Age of Onset  . Breast cancer Mother   . Diabetes Maternal Grandfather   . Lung cancer Father   . Heart failure Sister 57       Died. Morbidly obese  . Colon cancer Neg Hx   . Colon polyps Neg Hx     Social History Social History  Substance Use Topics  . Smoking status: Former Smoker    Packs/day: 2.00    Years: 20.00    Quit date: 05/20/2004  . Smokeless tobacco: Never Used  . Alcohol use No     Allergies   Penicillins; Codeine; Ranitidine hcl; Keflex [cephalexin]; and Lyrica [pregabalin]   Review of Systems Review of Systems  Cardiovascular: Positive for chest pain.  Gastrointestinal: Positive for diarrhea.     Physical Exam Updated Vital Signs BP 119/81   Pulse 76   Temp (!) 97.5 F (36.4 C) (Oral)   Resp (!) 22   Ht 5\' 4"  (1.626 m)   Wt 73.9 kg (163 lb)   SpO2 92%   BMI 27.98 kg/m   Physical Exam  Constitutional: She is oriented to person, place, and  time. She appears well-developed and well-nourished. No distress.  HENT:  Head: Normocephalic and atraumatic.  Moist mucous membranes  Eyes: Pupils are equal, round, and reactive to light. Conjunctivae are normal.  Neck: Neck supple.  Cardiovascular: Normal rate, regular rhythm and normal heart sounds.   No murmur heard. Pulmonary/Chest: Effort normal. She has no wheezes.  Mildly diminished BS R lower lung  Abdominal: Soft. Bowel sounds are normal. She exhibits no distension. There is no tenderness.  Musculoskeletal: She exhibits no edema.  Neurological: She is alert and oriented to person, place, and time.  Fluent speech  Skin: Skin is warm and dry.  Psychiatric: She has a normal mood and affect. Judgment normal.  Nursing note and vitals reviewed.    ED Treatments / Results  Labs (all labs ordered are listed, but only abnormal results are displayed) Labs Reviewed  BASIC METABOLIC PANEL - Abnormal; Notable for the following:       Result Value   Glucose, Bld 105 (*)    All other components within normal limits  CBC  I-STAT TROPONIN, ED  POCT I-STAT TROPONIN I  I-STAT TROPONIN, ED  POCT I-STAT TROPONIN I    EKG  EKG Interpretation  Date/Time:  Monday July 07 2017 11:07:22 EDT Ventricular Rate:  76 PR Interval:    QRS Duration: 77 QT Interval:  398 QTC Calculation: 448 R Axis:   35 Text Interpretation:  Sinus rhythm Probable left atrial enlargement No significant change since last tracing Confirmed by Theotis Burrow [phone]) on 07/07/2017 4:31:08 PM       Radiology Dg Chest 2 View  Result Date: 07/07/2017 CLINICAL DATA:  Worsening chest pain, nausea and shortness of breath. EXAM: CHEST  2 VIEW COMPARISON:  CT chest 06/06/2017 and chest radiograph 05/27/2017. FINDINGS: Trachea is midline. Heart size stable. Post radiation changes are seen in the medial aspects of both upper hemithoraces as well as medial left lower lobe. Lungs are otherwise clear. No pleural fluid.  IMPRESSION: Postradiation changes in the lungs bilaterally.  No acute findings. Electronically Signed   By: Lorin Picket M.D.   On: 07/07/2017 11:46   Ct Angio Chest Pe W/cm &/or Wo Cm  Result Date: 07/07/2017 CLINICAL DATA:  Worsening ongoing chest pain with new onset of dizziness and epigastric pressure x2 weeks. History of lung cancer  since 10/17. EXAM: CT ANGIOGRAPHY CHEST WITH CONTRAST TECHNIQUE: Multidetector CT imaging of the chest was performed using the standard protocol during bolus administration of intravenous contrast. Multiplanar CT image reconstructions and MIPs were obtained to evaluate the vascular anatomy. CONTRAST:  100 cc Isovue 370 IV COMPARISON:  None. FINDINGS: Cardiovascular: Suboptimal opacification of the pulmonary arterial system. No large central pulmonary embolus is identified to the distal lobar pulmonary arterial level. Beyond this, opacification is limited and therefore not conclusive for smaller pulmonary emboli. There is 4 cm ascending thoracic aortic aneurysm without dissection. Aortic atherosclerosis is noted. Trace anterior and posterior pericardial effusion. Normal sized heart. Mediastinum/Nodes: Slightly less prominent amorphous soft tissue density in the AP window and left paratracheal nodal stations since recent comparison, currently 2.6 x 2.5 cm versus 3.3 x 2.5 cm as measured at the same level within the aortico pulmonary window. Lungs/Pleura: Further regression of post radiated changes in the right upper lobe since prior. New airspace consolidation in the right lower lobe measuring approximately 3.7 x 2 1.5 cm. Findings could represent a focus of pneumonia. Follow-up after appropriate therapy is recommended to assure resolution. Upper Abdomen: Mild reservoir effect believed to account for slight intrahepatic ductal dilatation status post cholecystectomy. No space-occupying mass of the included visualized adrenal glands and liver. Musculoskeletal: Partially included  ACDF of the cervical spine. No suspicious osteolytic or blastic disease. No acute fracture. Review of the MIP images confirms the above findings. IMPRESSION: 1. Limited opacification of the pulmonary arterial system beyond the lobar level. No large central pulmonary embolus is identified. 2. New right lower lobe pneumonic consolidation suspicious for pneumonia. Followup PA and lateral chest X-ray is recommended in 3-4 weeks following trial of antibiotic therapy to ensure resolution and exclude underlying malignancy. 3. Further regression of post radiative change involving the mediastinum and paramediastinal lungs. 4. 4 cm ascending aortic aneurysm with atherosclerosis. Recommend annual imaging followup by CTA or MRA. This recommendation follows 2010 ACCF/AHA/AATS/ACR/ASA/SCA/SCAI/SIR/STS/SVM Guidelines for the Diagnosis and Management of Patients with Thoracic Aortic Disease. Circulation. 2010; 121: M841-L244 Aortic Atherosclerosis (ICD10-I70.0). Electronically Signed   By: Ashley Royalty M.D.   On: 07/07/2017 18:15    Procedures Procedures (including critical care time)  Medications Ordered in ED Medications  iopamidol (ISOVUE-370) 76 % injection (not administered)  ondansetron (ZOFRAN) injection 4 mg (4 mg Intravenous Given 07/07/17 1741)  oxyCODONE-acetaminophen (PERCOCET/ROXICET) 5-325 MG per tablet 1 tablet (1 tablet Oral Given 07/07/17 1741)  iopamidol (ISOVUE-370) 76 % injection 100 mL (100 mLs Intravenous Contrast Given 07/07/17 1730)  levofloxacin (LEVAQUIN) tablet 750 mg (750 mg Oral Given 07/07/17 1903)     Initial Impression / Assessment and Plan / ED Course  I have reviewed the triage vital signs and the nursing notes.  Pertinent labs & imaging results that were available during my care of the patient were reviewed by me and considered in my medical decision making (see chart for details).     Pt w/ acute on chronic chest pain and SOB, has had since Lung cancer diagnosis but reports that it  is been worse recently. Family reports associated fatigue. She was comfortable on exam with reassuring vital signs, O2 saturation 92-93% on room air with normal work of breathing. No wheezes on exam. CXR negative acute. Labs show normal CBC, negative serial trops, normal BMP. EKG similar to previous. Given that her chest pain has been ongoing, I doubt ACS.  I did obtain a CTA of chest to rule out PE given her known malignancy.  CTA is negative for PE but does show right lower lobe infiltrate. Patient states that these sx are similar to previous episodes of pneumonia. She is otherwise well-appearing with normal work of breathing, reassuring vital signs, and normal lab work. I feel it is reasonable to start her on Levaquin and have her follow closely with her outpatient providers and she feels comfortable with this plan. Instructed to see her PCP or oncologist within 2 days. Extensively reviewed return precautions with her including any worsening symptoms or new symptoms such as fever or intractable vomiting. She voiced understanding and was discharged in satisfactory condition.  Final Clinical Impressions(s) / ED Diagnoses   Final diagnoses:  Pneumonia of right lower lobe due to infectious organism [facility])    New Prescriptions New Prescriptions   LEVOFLOXACIN (LEVAQUIN) 750 MG TABLET    Take 1 tablet (750 mg total) by mouth daily.     Faraaz Wolin, Wenda Overland, MD 07/07/17 [phone]

## 2017-07-07 NOTE — ED Notes (Signed)
Patient transported to CT 

## 2017-07-07 NOTE — Discharge Instructions (Signed)
1. Take antibiotics as prescribed. Use albuterol every 4-6 hours as needed for wheezing. 2. See primary care provider or oncologist within 2 days for re-check. 3. RETURN TO ER IMMEDIATELY IF YOU HAVE ANY WORSENING SYMPTOMS INCLUDING FEVER, WORSENING SHORTNESS OF BREATH OR CHEST PAIN, VOMITING, OR DEHYDRATION.

## 2017-07-08 ENCOUNTER — Telehealth: Payer: Self-pay | Admitting: Medical Oncology

## 2017-07-08 NOTE — Telephone Encounter (Signed)
err

## 2017-07-08 NOTE — Telephone Encounter (Signed)
Pt called to say her pharmacy does not have the levaquin rx from ED visit. I left message with [facility] that her prescription is attached to her discharge papers given to her in ED ( confirmed with care manager in Ed. [phone])/

## 2017-07-09 ENCOUNTER — Telehealth: Payer: Self-pay | Admitting: Internal Medicine

## 2017-07-09 NOTE — Telephone Encounter (Signed)
Left voicemail for patient regarding an appointment with Rebekah Henderson. Tried to call Antony [facility]mon and the line was busy.

## 2017-07-14 ENCOUNTER — Ambulatory Visit (HOSPITAL_BASED_OUTPATIENT_CLINIC_OR_DEPARTMENT_OTHER): Payer: Medicare Other

## 2017-07-14 ENCOUNTER — Ambulatory Visit (HOSPITAL_BASED_OUTPATIENT_CLINIC_OR_DEPARTMENT_OTHER): Payer: Medicare Other | Admitting: Oncology

## 2017-07-14 VITALS — BP 116/77 | HR 75 | Temp 97.6°F | Resp 18 | Ht 64.0 in | Wt 161.5 lb

## 2017-07-14 DIAGNOSIS — C3432 Malignant neoplasm of lower lobe, left bronchus or lung: Secondary | ICD-10-CM | POA: Diagnosis not present

## 2017-07-14 DIAGNOSIS — J188 Other pneumonia, unspecified organism: Secondary | ICD-10-CM | POA: Diagnosis not present

## 2017-07-14 DIAGNOSIS — J189 Pneumonia, unspecified organism: Secondary | ICD-10-CM

## 2017-07-14 DIAGNOSIS — C3492 Malignant neoplasm of unspecified part of left bronchus or lung: Secondary | ICD-10-CM

## 2017-07-14 DIAGNOSIS — E876 Hypokalemia: Secondary | ICD-10-CM

## 2017-07-14 LAB — CBC WITH DIFFERENTIAL/PLATELET
BASO%: 0.1 % (ref 0.0–2.0)
Basophils Absolute: 0 10*3/uL (ref 0.0–0.1)
EOS%: 2.3 % (ref 0.0–7.0)
Eosinophils Absolute: 0.2 10*3/uL (ref 0.0–0.5)
HEMATOCRIT: 38.3 % (ref 34.8–46.6)
HEMOGLOBIN: 13.1 g/dL (ref 11.6–15.9)
LYMPH#: 1.5 10*3/uL (ref 0.9–3.3)
LYMPH%: 20.2 % (ref 14.0–49.7)
MCH: 31.9 pg (ref 25.1–34.0)
MCHC: 34.2 g/dL (ref 31.5–36.0)
MCV: 93.2 fL (ref 79.5–101.0)
MONO#: 0.7 10*3/uL (ref 0.1–0.9)
MONO%: 10.1 % (ref 0.0–14.0)
NEUT%: 67.3 % (ref 38.4–76.8)
NEUTROS ABS: 4.9 10*3/uL (ref 1.5–6.5)
PLATELETS: 209 10*3/uL (ref 145–400)
RBC: 4.11 10*6/uL (ref 3.70–5.45)
RDW: 14.6 % — AB (ref 11.2–14.5)
WBC: 7.3 10*3/uL (ref 3.9–10.3)

## 2017-07-14 LAB — COMPREHENSIVE METABOLIC PANEL
ALBUMIN: 3.5 g/dL (ref 3.5–5.0)
ALT: 6 U/L (ref 0–55)
ANION GAP: 11 meq/L (ref 3–11)
AST: 10 U/L (ref 5–34)
Alkaline Phosphatase: 37 U/L — ABNORMAL LOW (ref 40–150)
BILIRUBIN TOTAL: 0.59 mg/dL (ref 0.20–1.20)
BUN: 9.8 mg/dL (ref 7.0–26.0)
CALCIUM: 9.1 mg/dL (ref 8.4–10.4)
CHLORIDE: 106 meq/L (ref 98–109)
CO2: 26 mEq/L (ref 22–29)
Creatinine: 0.8 mg/dL (ref 0.6–1.1)
EGFR: 78 mL/min/{1.73_m2} — ABNORMAL LOW (ref >90)
Glucose: 86 mg/dl (ref 70–140)
Potassium: 2.9 mEq/L — CL (ref 3.5–5.1)
Sodium: 142 mEq/L (ref 136–145)
TOTAL PROTEIN: 6.6 g/dL (ref 6.4–8.3)

## 2017-07-14 LAB — MAGNESIUM: Magnesium: 1.1 mg/dl — CL (ref 1.5–2.5)

## 2017-07-14 MED ORDER — POTASSIUM CHLORIDE CRYS ER 20 MEQ PO TBCR: EXTENDED_RELEASE_TABLET | ORAL | 0 refills | Status: DC

## 2017-07-14 MED ORDER — MAGNESIUM OXIDE 400 (241.3 MG) MG PO TABS: ORAL_TABLET | ORAL | 0 refills | Status: DC

## 2017-07-14 MED ORDER — METHYLPREDNISOLONE 4 MG PO TABS: ORAL_TABLET | ORAL | 0 refills | Status: DC

## 2017-07-14 NOTE — Progress Notes (Signed)
[facility] Follow up:    Redmond School, MD [address] [place] [address]   DIAGNOSIS: Cancer Staging Small cell lung cancer, left [facility]) Staging form: Lung, AJCC 7th Edition - Clinical stage from 09/19/2016: Stage IIIA (T1b, N2, M0) - Signed by Curt Bears, MD on 09/19/2016 Limited stage (T1b, N2, M0) small cell lung cancer presented with left lower lobe/infrahilar mass and large mediastinal lymphadenopathy diagnosed in October 2017.  SUMMARY OF ONCOLOGIC HISTORY: Oncology History   Patient presented with hoarseness, cough, and wheezing.  Work up showed mediastinal mass.   Small cell lung cancer, left ([place])   Staging form: Lung, AJCC 7th Edition   - Clinical stage from 09/19/2016: Stage IIIA (T1b, N2, M0) - Signed by Curt Bears, MD on 09/19/2016      Small cell lung cancer, left ([place])   09/12/2016 Surgery         09/12/2016 Surgery    Flexible video fiberoptic bronchoscopy with endobronchial ultrasound and biopsies.      09/13/2016 Pathology Results    Bronchus, biopsy, Left Main Stem - SMALL CELL CARCINOMA.       09/19/2016 Initial Diagnosis    Small cell lung cancer, left ([place])     09/27/2016 Imaging    PET IMPRESSION: 3.3 cm mass in the medial left lower lobe adjacent to the descending thoracic aorta, corresponding to suspected primary bronchogenic neoplasm. Associated mediastinal and left hilar nodal metastases.      09/27/2016 Imaging    MRI BrIMPRESSION: No acute intracranial abnormality. No intracranial metastatic disease.ain       10/02/2016 -  Chemotherapy    The patient had palonosetron (ALOXI) injection 0.25 mg, 0.25 mg, Intravenous,  Once, 1 of 6 cycles  pegfilgrastim (NEULASTA) injection 6 mg, 6 mg, Subcutaneous,  Once, 0 of 5 cycles  CISplatin (PLATINOL) 119 mg in sodium chloride 0.9 % 500 mL chemo infusion, 60 mg/m2 = 119 mg, Intravenous,  Once, 1 of 6 cycles  etoposide (VEPESID) 240 mg in sodium  chloride 0.9 % 600 mL chemo infusion, 120 mg/m2 = 240 mg, Intravenous,  Once, 1 of 6 cycles  fosaprepitant (EMEND) 150 mg, dexamethasone (DECADRON) 12 mg in sodium chloride 0.9 % 145 mL IVPB, , Intravenous,  Once, 1 of 6 cycles  palonosetron (ALOXI) injection 0.25 mg, 0.25 mg, Intravenous,  Once, 2 of 4 cycles  pegfilgrastim (NEULASTA) injection 6 mg, 6 mg, Subcutaneous,  Once, 2 of 4 cycles  CARBOplatin (PARAPLATIN) 480 mg in sodium chloride 0.9 % 250 mL chemo infusion, 480 mg (100 % of original dose 481.6 mg), Intravenous,  Once, 2 of 4 cycles Dose modification: 602 mg (original dose 481.6 mg, Cycle 1), 481.6 mg (original dose 481.6 mg, Cycle 1, Reason: Provider Judgment), 602 mg (original dose 602 mg, Cycle 2), 481.6 mg (original dose 602 mg, Cycle 2, Reason: Provider Judgment)  etoposide (VEPESID) 190 mg in sodium chloride 0.9 % 500 mL chemo infusion, 100 mg/m2 = 190 mg, Intravenous,  Once, 2 of 4 cycles  for chemotherapy treatment.        PRIOR THERAPY: 1) Systemic chemotherapy with cisplatin 60 MG/M2 on day 1 and etoposide 120 MG/M2 on days 1, 2 and 3 status post 1 cycle. This was discontinued secondary to intolerance. 2) Systemic chemotherapy with carboplatin for AUC of 4 on day 1 and etoposide 100 MG/M2 on days 1, 2 and 3 with Neulasta support on day 4 every 3 weeks. Status post 5 cycles. This was concurrent with  radiation in [place], [place]. 3) prophylactic cranial irradiation.  CURRENT THERAPY: observation  INTERVAL HISTORY: Rebekah Henderson 61 y.o. female returns for work in appointment. The patient was seen in the emergency room several days ago due to shortness of breath. She was diagnosed with pneumonia and placed on levaquin. Patient states that she completed this yesterday. Patient has ongoing shortness of breath. She does have an intermittent cough, but no sputum production or hemoptysis. Denies fevers and chills. Denies chest pain. Denies abdominal pain. She has ongoing  nausea, but no vomiting. Denies constipation and diarrhea. The patient is here for evaluation following her ER visit.   Patient Active Problem List   Diagnosis Date Noted  . Acute on chronic respiratory failure with hypoxia ([place]) 03/21/2017  . Mild protein-calorie malnutrition ([place]) 03/21/2017  . HCAP (healthcare-associated pneumonia) 03/20/2017  . Dehydration 03/06/2017  . Symptomatic anemia 01/04/2017  . Thrombocytopenia ([place]) 01/04/2017  . Influenza A 12/10/2016  . SIRS (systemic inflammatory response syndrome) ([place]) 12/07/2016  . Hypoxia 12/07/2016  . Bronchitis 12/07/2016  . Encounter for antineoplastic chemotherapy 12/03/2016  . 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 Protein-calorie malnutrition, severe 10/30/2016  . Vomiting 10/29/2016  . Abdominal pain 10/29/2016  . Rash and nonspecific skin eruption 10/29/2016  . Neutropenic fever ([place]) 10/15/2016  . Intractable nausea and vomiting 10/15/2016  . Pancytopenia due to antineoplastic chemotherapy ([place]) 10/15/2016  . Nodular rash 10/15/2016  . Hypokalemia 10/15/2016  . Hypomagnesemia 10/15/2016  . Chronic respiratory failure with hypoxia ([place]) 10/15/2016  . Small cell lung cancer, left ([place]) 09/19/2016  . Mediastinal mass 09/10/2016  . COPD (chronic obstructive pulmonary disease) ([place])   . Hypertension   . Fibromyalgia   . IBS (irritable bowel syndrome)   . GERD (gastroesophageal reflux disease)   . Hypothyroidism   . Colon polyps   . Anxiety   . PONV (postoperative nausea and vomiting)   . Diarrhea 05/12/2013    is allergic to penicillins; codeine; ranitidine hcl; keflex [cephalexin]; and lyrica [pregabalin].  MEDICAL HISTORY: Past Medical History:  Diagnosis Date  . 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 Anxiety    takes Prozac daily  . Arthritis   . Colon polyps   . COPD (chronic obstructive pulmonary disease) ([place])   . Dehydration 03/06/2017  . Diverticulitis   . Dyspnea     with exertion  . Encounter for antineoplastic chemotherapy 12/03/2016  . Fibromyalgia   . GERD (gastroesophageal reflux disease)    takes Pantoprazole daily  . Hypertension    takes Metoprolol,Triamterene-HCTZ,and Amlodipine daily  . Hypothyroidism    takes Synthroid daily  . IBS (irritable bowel syndrome)   . lung ca dx'd 10/02/2016   skin, lung  . PONV (postoperative nausea and vomiting)    pt also states that she had some difficulty breathing after cervical fusion    SURGICAL HISTORY: Past Surgical History:  Procedure Laterality Date  . BIOPSY N/A 05/25/2013   Procedure: BIOPSIES (Random Colon; Duodenal; Gastric);  Surgeon: Danie Binder, MD;  Location: [facility];  Service: Endoscopy;  Laterality: N/A;  . BLADDER SUSPENSION    . BREAST ENHANCEMENT SURGERY    . BREAST IMPLANT REMOVAL    . CERVICAL FUSION  AUG 2013  . CHOLECYSTECTOMY  1999  . COLONOSCOPY  2007 [place]   POLYPS  . COLONOSCOPY WITH PROPOFOL N/A 05/25/2013   Procedure: COLONOSCOPY WITH PROPOFOL(at cecum 0957) total withdrawal time=8min);  Surgeon: Danie Binder, MD;  Location: [facility];  Service: Endoscopy;  Laterality: N/A;  . ESOPHAGOGASTRODUODENOSCOPY (EGD) WITH PROPOFOL N/A 05/25/2013   Procedure: ESOPHAGOGASTRODUODENOSCOPY (EGD) WITH PROPOFOL;  Surgeon: Danie Binder, MD;  Location: [facility];  Service: Endoscopy;  Laterality: N/A;  . FOOT SURGERY    . POLYPECTOMY N/A 05/25/2013   Procedure: POLYPECTOMY (Rectal and Gastric);  Surgeon: Danie Binder, MD;  Location: [facility];  Service: Endoscopy;  Laterality: N/A;  . TONSILLECTOMY    . UPPER GASTROINTESTINAL ENDOSCOPY    . VIDEO BRONCHOSCOPY WITH ENDOBRONCHIAL ULTRASOUND  09/12/2016   Procedure: VIDEO BRONCHOSCOPY WITH ENDOBRONCHIAL ULTRASOUND AND BIOPSY;  Surgeon: Juanito Doom, MD;  Location: [place];  Service: Cardiopulmonary;;    SOCIAL HISTORY: Social History   Social History  . Marital status: Divorced    Spouse name: N/A  . Number of children: 1  .  Years of education: College    Occupational History  . Retired     Social History Main Topics  . Smoking status: Former Smoker    Packs/day: 2.00    Years: 20.00    Quit date: 05/20/2004  . Smokeless tobacco: Never Used  . Alcohol use No  . Drug use: No  . Sexual activity: Not on file   Other Topics Concern  . Not on file   Social History Narrative   Lives alone.  Retired from [employer] HISTORY: Family History  Problem Relation Age of Onset  . Breast cancer Mother   . Diabetes Maternal Grandfather   . Lung cancer Father   . Heart failure Sister 49       Died. Morbidly obese  . Colon cancer Neg Hx   . Colon polyps Neg Hx     Review of Systems  Constitutional: Positive for fatigue. Negative for chills and fever.  HENT:  Negative.   Eyes: Negative.   Respiratory: Positive for shortness of breath. Negative for cough, hemoptysis and wheezing.   Cardiovascular: Negative.   Gastrointestinal: Positive for nausea. Negative for abdominal pain, constipation, diarrhea and vomiting.  Endocrine: Negative.   Genitourinary: Negative.    Musculoskeletal: Negative.   Skin: Negative.   Neurological: Negative.   Hematological: Negative.   Psychiatric/Behavioral: Negative.     PHYSICAL EXAMINATION  ECOG PERFORMANCE STATUS: 1 - Symptomatic but completely ambulatory  Vitals:   07/14/17 1513  BP: 116/77  Pulse: 75  Resp: 18  Temp: 97.6 F (36.4 C)  SpO2: 98%    Physical Exam  Constitutional: She is oriented to person, place, and time and well-developed, well-nourished, and in no distress. No distress.  HENT:  Head: Normocephalic.  Mouth/Throat: Oropharynx is clear and moist. No oropharyngeal exudate.  Eyes: Conjunctivae are normal. No scleral icterus.  Neck: Normal range of motion. Neck supple.  Cardiovascular: Normal rate, regular rhythm and normal heart sounds.   Pulmonary/Chest: Effort normal. No respiratory distress. She has wheezes.  Faint expiratory  wheezes noted.  Abdominal: Soft. Bowel sounds are normal. She exhibits no distension and no mass. There is no tenderness.  Musculoskeletal: Normal range of motion. She exhibits no edema.  Lymphadenopathy:    She has no cervical adenopathy.  Neurological: She is alert and oriented to person, place, and time. She exhibits normal muscle tone. Gait normal.  Skin: Skin is warm and dry. No rash noted. She is not diaphoretic. No erythema. No pallor.  Psychiatric: Mood, memory, affect and judgment normal.  Vitals reviewed.   LABORATORY DATA:  CBC    Component Value Date/Time   WBC 7.3 07/14/2017  1454   WBC 7.0 07/07/2017 1120   RBC 4.11 07/14/2017 1454   RBC 4.34 07/07/2017 1120   HGB 13.1 07/14/2017 1454   HCT 38.3 07/14/2017 1454   PLT 209 07/14/2017 1454   MCV 93.2 07/14/2017 1454   MCH 31.9 07/14/2017 1454   MCH 32.0 07/07/2017 1120   MCHC 34.2 07/14/2017 1454   MCHC 34.7 07/07/2017 1120   RDW 14.6 (H) 07/14/2017 1454   LYMPHSABS 1.5 07/14/2017 1454   MONOABS 0.7 07/14/2017 1454   EOSABS 0.2 07/14/2017 1454   BASOSABS 0.0 07/14/2017 1454    CMP     Component Value Date/Time   NA 142 07/14/2017 1454   K 2.9 (LL) 07/14/2017 1454   CL 103 07/07/2017 1120   CO2 26 07/14/2017 1454   GLUCOSE 86 07/14/2017 1454   BUN 9.8 07/14/2017 1454   CREATININE 0.8 07/14/2017 1454   CALCIUM 9.1 07/14/2017 1454   PROT 6.6 07/14/2017 1454   ALBUMIN 3.5 07/14/2017 1454   AST 10 07/14/2017 1454   ALT 6 07/14/2017 1454   ALKPHOS 37 (L) 07/14/2017 1454   BILITOT 0.59 07/14/2017 1454   GFRNONAA >60 07/07/2017 1120   GFRNONAA >89 05/12/2013 1524   GFRAA >60 07/07/2017 1120   GFRAA >89 05/12/2013 1524  magnesium level 1.1  RADIOGRAPHIC STUDIES:  No results found. EXAM: CT ANGIOGRAPHY CHEST WITH CONTRAST  TECHNIQUE: Multidetector CT imaging of the chest was performed using the standard protocol during bolus administration of intravenous contrast. Multiplanar CT image  reconstructions and MIPs were obtained to evaluate the vascular anatomy.  CONTRAST:  100 cc Isovue 370 IV  COMPARISON:  None.  FINDINGS: Cardiovascular: Suboptimal opacification of the pulmonary arterial system. No large central pulmonary embolus is identified to the distal lobar pulmonary arterial level. Beyond this, opacification is limited and therefore not conclusive for smaller pulmonary emboli. There is 4 cm ascending thoracic aortic aneurysm without dissection. Aortic atherosclerosis is noted. Trace anterior and posterior pericardial effusion. Normal sized heart.  Mediastinum/Nodes: Slightly less prominent amorphous soft tissue density in the AP window and left paratracheal nodal stations since recent comparison, currently 2.6 x 2.5 cm versus 3.3 x 2.5 cm as measured at the same level within the aortico pulmonary window.  Lungs/Pleura: Further regression of post radiated changes in the right upper lobe since prior. New airspace consolidation in the right lower lobe measuring approximately 3.7 x 2 1.5 cm. Findings could represent a focus of pneumonia. Follow-up after appropriate therapy is recommended to assure resolution.  Upper Abdomen: Mild reservoir effect believed to account for slight intrahepatic ductal dilatation status post cholecystectomy. No space-occupying mass of the included visualized adrenal glands and liver.  Musculoskeletal: Partially included ACDF of the cervical spine. No suspicious osteolytic or blastic disease. No acute fracture.  Review of the MIP images confirms the above findings.  IMPRESSION: 1. Limited opacification of the pulmonary arterial system beyond the lobar level. No large central pulmonary embolus is identified. 2. New right lower lobe pneumonic consolidation suspicious for pneumonia. Followup PA and lateral chest X-ray is recommended in 3-4 weeks following trial of antibiotic therapy to ensure resolution and exclude  underlying malignancy. 3. Further regression of post radiative change involving the mediastinum and paramediastinal lungs. 4. 4 cm ascending aortic aneurysm with atherosclerosis. Recommend annual imaging followup by CTA or MRA. This recommendation follows 2010 ACCF/AHA/AATS/ACR/ASA/SCA/SCAI/SIR/STS/SVM Guidelines for the Diagnosis and Management of Patients with Thoracic Aortic Disease. Circulation. 2010; 121: W098-J191  Aortic Atherosclerosis (ICD10-I70.0).   Electronically Signed  By: Ashley Royalty M.D.   On: 07/07/2017 18:15  ASSESSMENT and THERAPY PLAN:   Small cell lung cancer, left [facility]) This is a very pleasant 61 years old white female was limited stage small cell lung cancer, status post 6 cycles of systemic chemotherapy with carboplatin and etoposide concurrent with radiation and followed by prophylactic cranial irradiation. The patient is feeling fine today with no specific complaints. She had repeat CT scan of the chest performed recently. She remains on observation with repeat CT scan of the chest in October of this year as previously scheduled.  Follow-up visit will be several days after the scan to review the results.  HCAP (healthcare-associated pneumonia) The patient was seen in the emergency room recently diagnosed with pneumonia. She was placed on Levaquin which she has completed. WBC is normal today.She is afebrile. She has faint expiratory wheezes and still remains slightly short of breath. I have given her a Medrol Dosepak to help with the wheezing.  Hypokalemia The patient's potassium level is low today at 2.9. She denies any loss is such as vomiting or diarrhea. She was previously on potassium and is no longer taking this. I have sent a prescription for K-Dur 20 milliequivalents with instructions to take  Tablet twice a day for one week and then one tablet daily.  Repeat potassium will be obtained in approximately 2 weeks at [facility] per the  patient's request.  Hypomagnesemia The patient's magnesium level is low at 1.1. She was previously on mag oxide but is no longer taking this. I have instructed her to restart mag oxide 1 tablet twice a day for one week and then one tablet daily.  We will recheck her magnesium level in 2 weeks at [facility] per her request..   No orders of the defined types were placed in this encounter.   All questions were answered. The patient knows to call the clinic with any problems, questions or concerns. We can certainly see the patient much sooner if necessary.  Mikey Bussing, NP 07/16/2017

## 2017-07-16 ENCOUNTER — Encounter: Payer: Self-pay | Admitting: Oncology

## 2017-07-16 ENCOUNTER — Other Ambulatory Visit: Payer: Self-pay | Admitting: Medical Oncology

## 2017-07-16 ENCOUNTER — Telehealth: Payer: Self-pay | Admitting: Medical Oncology

## 2017-07-16 DIAGNOSIS — C3492 Malignant neoplasm of unspecified part of left bronchus or lung: Secondary | ICD-10-CM

## 2017-07-16 NOTE — Assessment & Plan Note (Signed)
The patient was seen in the emergency room recently diagnosed with pneumonia. She was placed on Levaquin which she has completed. WBC is normal today.She is afebrile. She has faint expiratory wheezes and still remains slightly short of breath. I have given her a Medrol Dosepak to help with the wheezing.

## 2017-07-16 NOTE — Assessment & Plan Note (Signed)
The patient's magnesium level is low at 1.1. She was previously on mag oxide but is no longer taking this. I have instructed her to restart mag oxide 1 tablet twice a day for one week and then one tablet daily.  We will recheck her magnesium level in 2 weeks at [facility] per her request..

## 2017-07-16 NOTE — Assessment & Plan Note (Signed)
This is a very pleasant 61 years old white female was limited stage small cell lung cancer, status post 6 cycles of systemic chemotherapy with carboplatin and etoposide concurrent with radiation and followed by prophylactic cranial irradiation. The patient is feeling fine today with no specific complaints. She had repeat CT scan of the chest performed recently. She remains on observation with repeat CT scan of the chest in October of this year as previously scheduled.  Follow-up visit will be several days after the scan to review the results.

## 2017-07-16 NOTE — Telephone Encounter (Signed)
Left message to get labs at [facility]  on 8/27. E- orders sent and I verified with [facility] that they can see orders.

## 2017-07-16 NOTE — Assessment & Plan Note (Signed)
The patient's potassium level is low today at 2.9. She denies any loss is such as vomiting or diarrhea. She was previously on potassium and is no longer taking this. I have sent a prescription for K-Dur 20 milliequivalents with instructions to take  Tablet twice a day for one week and then one tablet daily.  Repeat potassium will be obtained in approximately 2 weeks at [facility] per the patient's request.

## 2017-07-17 ENCOUNTER — Telehealth: Payer: Self-pay

## 2017-07-17 NOTE — Telephone Encounter (Signed)
Pt called to clarify her lab appt at [facility]. She wrote down the date, and will at any time. Clarified this was to f/u on her magnesium level. Pt wrote down her appts in October.

## 2017-07-28 ENCOUNTER — Other Ambulatory Visit (HOSPITAL_COMMUNITY)
Admission: RE | Admit: 2017-07-28 | Discharge: 2017-07-28 | Disposition: A | Discharge status: Home / Self Care | Payer: Medicare Other | Source: Ambulatory Visit | Attending: Oncology | Admitting: Oncology

## 2017-07-28 DIAGNOSIS — Z6828 Body mass index (BMI) 28.0-28.9, adult: Secondary | ICD-10-CM | POA: Diagnosis not present

## 2017-07-28 DIAGNOSIS — C3492 Malignant neoplasm of unspecified part of left bronchus or lung: Secondary | ICD-10-CM | POA: Insufficient documentation

## 2017-07-28 DIAGNOSIS — J449 Chronic obstructive pulmonary disease, unspecified: Secondary | ICD-10-CM | POA: Diagnosis not present

## 2017-07-28 DIAGNOSIS — C801 Malignant (primary) neoplasm, unspecified: Secondary | ICD-10-CM | POA: Diagnosis not present

## 2017-07-28 DIAGNOSIS — J189 Pneumonia, unspecified organism: Secondary | ICD-10-CM | POA: Diagnosis not present

## 2017-07-28 LAB — MAGNESIUM: MAGNESIUM: 1.3 mg/dL — AB (ref 1.7–2.4)

## 2017-07-28 LAB — POTASSIUM: POTASSIUM: 3.4 mmol/L — AB (ref 3.5–5.1)

## 2017-07-29 ENCOUNTER — Other Ambulatory Visit: Payer: Self-pay | Admitting: Medical Oncology

## 2017-07-29 ENCOUNTER — Other Ambulatory Visit: Payer: Self-pay | Admitting: Oncology

## 2017-07-29 ENCOUNTER — Telehealth: Payer: Self-pay | Admitting: *Deleted

## 2017-07-29 DIAGNOSIS — C3492 Malignant neoplasm of unspecified part of left bronchus or lung: Secondary | ICD-10-CM

## 2017-07-29 NOTE — Telephone Encounter (Signed)
Telephone call to patient- left message for return call.  Patient magnesium level 1.3  Call to notify lab results. Patient needs to continue KDur 20 Meq daily. Any vomiting or diarrhea? Confirm patient is still taking protonix (tendency to decrease mg) May need to switch to Pepcid or Tagament.   Magnesium supplement taking once daily? Need to increase to TID and recheck labs in 2 weeks.

## 2017-07-29 NOTE — Telephone Encounter (Signed)
Patient needs to start OTC Slow Mag- 1 tablet BID. Patient will need to return to [facility] to recheck labs in 2 weeks. Message left for return call.

## 2017-08-01 DIAGNOSIS — J449 Chronic obstructive pulmonary disease, unspecified: Secondary | ICD-10-CM | POA: Diagnosis not present

## 2017-08-01 DIAGNOSIS — R0902 Hypoxemia: Secondary | ICD-10-CM | POA: Diagnosis not present

## 2017-08-21 ENCOUNTER — Telehealth: Payer: Self-pay | Admitting: Medical Oncology

## 2017-08-21 ENCOUNTER — Other Ambulatory Visit: Payer: Self-pay | Admitting: Medical Oncology

## 2017-08-21 NOTE — Telephone Encounter (Signed)
I called pt . She did not get repeat k and mag on 9/11 at [facility]. Electronic orders are in computer to be done at [facility]. She said she will go to [facility]. I told her to have [facility] lab call triage if they do not see order. Pt stated she is tolerating the slo-mag very well.

## 2017-08-21 NOTE — Telephone Encounter (Signed)
-----   Message from Maryanna Shape, NP sent at 08/21/2017  1:45 PM EDT ----- Regarding: Missed labs Rebekah Henderson  Can you call [facility]. She was supposed to have repeat labs 2 weeks after adjustment of her K+ an Mg. It looks like it should have been done last week. Orders were placed for her to have this done at [facility].  Can you see if she can go within the next few days?  Thanks [employer]

## 2017-08-22 ENCOUNTER — Other Ambulatory Visit (HOSPITAL_COMMUNITY)
Admission: RE | Admit: 2017-08-22 | Discharge: 2017-08-22 | Disposition: A | Discharge status: Home / Self Care | Payer: Medicare Other | Source: Ambulatory Visit | Attending: Internal Medicine | Admitting: Internal Medicine

## 2017-08-22 DIAGNOSIS — C3490 Malignant neoplasm of unspecified part of unspecified bronchus or lung: Secondary | ICD-10-CM | POA: Diagnosis present

## 2017-08-22 LAB — COMPREHENSIVE METABOLIC PANEL
ALBUMIN: 3.5 g/dL (ref 3.5–5.0)
ALK PHOS: 53 U/L (ref 38–126)
ALT: 10 U/L — AB (ref 14–54)
ANION GAP: 10 (ref 5–15)
AST: 17 U/L (ref 15–41)
BILIRUBIN TOTAL: 0.7 mg/dL (ref 0.3–1.2)
BUN: 8 mg/dL (ref 6–20)
CALCIUM: 8.9 mg/dL (ref 8.9–10.3)
CO2: 25 mmol/L (ref 22–32)
CREATININE: 0.67 mg/dL (ref 0.44–1.00)
Chloride: 104 mmol/L (ref 101–111)
GFR calc Af Amer: >60 mL/min (ref >60)
GFR calc non Af Amer: >60 mL/min (ref >60)
GLUCOSE: 95 mg/dL (ref 65–99)
Potassium: 3.8 mmol/L (ref 3.5–5.1)
SODIUM: 139 mmol/L (ref 135–145)
TOTAL PROTEIN: 7 g/dL (ref 6.5–8.1)

## 2017-08-22 LAB — CBC WITH DIFFERENTIAL/PLATELET
BASOS ABS: 0 10*3/uL (ref 0.0–0.1)
BASOS PCT: 0 %
EOS ABS: 0.5 10*3/uL (ref 0.0–0.7)
Eosinophils Relative: 7 %
HEMATOCRIT: 38.7 % (ref 36.0–46.0)
HEMOGLOBIN: 13.2 g/dL (ref 12.0–15.0)
Lymphocytes Relative: 23 %
Lymphs Abs: 1.7 10*3/uL (ref 0.7–4.0)
MCH: 32.4 pg (ref 26.0–34.0)
MCHC: 34.1 g/dL (ref 30.0–36.0)
MCV: 94.9 fL (ref 78.0–100.0)
MONOS PCT: 8 %
Monocytes Absolute: 0.6 10*3/uL (ref 0.1–1.0)
NEUTROS ABS: 4.7 10*3/uL (ref 1.7–7.7)
NEUTROS PCT: 62 %
Platelets: 319 10*3/uL (ref 150–400)
RBC: 4.08 MIL/uL (ref 3.87–5.11)
RDW: 14.5 % (ref 11.5–15.5)
WBC: 7.5 10*3/uL (ref 4.0–10.5)

## 2017-08-31 DIAGNOSIS — J449 Chronic obstructive pulmonary disease, unspecified: Secondary | ICD-10-CM | POA: Diagnosis not present

## 2017-08-31 DIAGNOSIS — R0902 Hypoxemia: Secondary | ICD-10-CM | POA: Diagnosis not present

## 2017-09-04 ENCOUNTER — Telehealth: Payer: Self-pay

## 2017-09-04 NOTE — Telephone Encounter (Signed)
Pt calling for potassium refill. She has been out all week, she states the pharmacy was contacting [facility]. She states she is currently taking 2/day which is not what current rx states. Please clarify with MD and refill.

## 2017-09-05 MED ORDER — POTASSIUM CHLORIDE CRYS ER 20 MEQ PO TBCR: EXTENDED_RELEASE_TABLET | ORAL | 0 refills | Status: DC

## 2017-09-05 NOTE — Telephone Encounter (Signed)
lvm with pt that rx refilled

## 2017-09-05 NOTE — Telephone Encounter (Signed)
Ok to refill 

## 2017-09-05 NOTE — Telephone Encounter (Signed)
Pt Rx for potassium refilled.

## 2017-09-05 NOTE — Addendum Note (Signed)
Addended by: Lucile Crater on: 09/05/2017 11:07 AM   Modules accepted: Orders

## 2017-09-08 ENCOUNTER — Encounter (HOSPITAL_COMMUNITY): Payer: Self-pay

## 2017-09-08 ENCOUNTER — Ambulatory Visit (HOSPITAL_COMMUNITY)
Admission: RE | Admit: 2017-09-08 | Discharge: 2017-09-08 | Disposition: A | Discharge status: Home / Self Care | Payer: Medicare Other | Source: Ambulatory Visit | Attending: Internal Medicine | Admitting: Internal Medicine

## 2017-09-08 ENCOUNTER — Other Ambulatory Visit (HOSPITAL_BASED_OUTPATIENT_CLINIC_OR_DEPARTMENT_OTHER): Payer: Medicare Other

## 2017-09-08 DIAGNOSIS — J449 Chronic obstructive pulmonary disease, unspecified: Secondary | ICD-10-CM | POA: Diagnosis not present

## 2017-09-08 DIAGNOSIS — C3490 Malignant neoplasm of unspecified part of unspecified bronchus or lung: Secondary | ICD-10-CM | POA: Diagnosis not present

## 2017-09-08 DIAGNOSIS — C3492 Malignant neoplasm of unspecified part of left bronchus or lung: Secondary | ICD-10-CM

## 2017-09-08 DIAGNOSIS — I700 Atherosclerosis of aorta: Secondary | ICD-10-CM | POA: Diagnosis not present

## 2017-09-08 DIAGNOSIS — J8410 Pulmonary fibrosis, unspecified: Secondary | ICD-10-CM | POA: Diagnosis not present

## 2017-09-08 DIAGNOSIS — C3432 Malignant neoplasm of lower lobe, left bronchus or lung: Secondary | ICD-10-CM | POA: Diagnosis not present

## 2017-09-08 DIAGNOSIS — Z23 Encounter for immunization: Secondary | ICD-10-CM | POA: Diagnosis not present

## 2017-09-08 LAB — COMPREHENSIVE METABOLIC PANEL
ALK PHOS: 49 U/L (ref 40–150)
ALT: <6 U/L (ref 0–55)
AST: 9 U/L (ref 5–34)
Albumin: 3.2 g/dL — ABNORMAL LOW (ref 3.5–5.0)
Anion Gap: 8 mEq/L (ref 3–11)
BUN: 5.8 mg/dL — AB (ref 7.0–26.0)
CO2: 29 mEq/L (ref 22–29)
Calcium: 9 mg/dL (ref 8.4–10.4)
Chloride: 103 mEq/L (ref 98–109)
Creatinine: 0.7 mg/dL (ref 0.6–1.1)
GLUCOSE: 77 mg/dL (ref 70–140)
POTASSIUM: 3.5 meq/L (ref 3.5–5.1)
SODIUM: 141 meq/L (ref 136–145)
Total Bilirubin: 0.51 mg/dL (ref 0.20–1.20)
Total Protein: 6.7 g/dL (ref 6.4–8.3)

## 2017-09-08 MED ORDER — IOPAMIDOL (ISOVUE-300) INJECTION 61%
75.0000 mL | Freq: Once | INTRAVENOUS | Status: AC | PRN
  Administered 2017-09-08: 75 mL via INTRAVENOUS

## 2017-09-08 MED ORDER — IOPAMIDOL (ISOVUE-300) INJECTION 61%
INTRAVENOUS | Status: AC
  Administered 2017-09-08: 75 mL via INTRAVENOUS
  Filled 2017-09-08: qty 75

## 2017-09-10 ENCOUNTER — Encounter: Payer: Self-pay | Admitting: Internal Medicine

## 2017-09-10 ENCOUNTER — Ambulatory Visit (HOSPITAL_BASED_OUTPATIENT_CLINIC_OR_DEPARTMENT_OTHER): Payer: Medicare Other | Admitting: Internal Medicine

## 2017-09-10 VITALS — BP 119/82 | HR 68 | Temp 97.9°F | Resp 18 | Ht 64.0 in | Wt 166.8 lb

## 2017-09-10 DIAGNOSIS — I10 Essential (primary) hypertension: Secondary | ICD-10-CM

## 2017-09-10 DIAGNOSIS — C3432 Malignant neoplasm of lower lobe, left bronchus or lung: Secondary | ICD-10-CM | POA: Diagnosis not present

## 2017-09-10 DIAGNOSIS — C3492 Malignant neoplasm of unspecified part of left bronchus or lung: Secondary | ICD-10-CM

## 2017-09-10 NOTE — Progress Notes (Signed)
[place] Telephone:(336) [phone]   Fax:(336) [phone]  OFFICE PROGRESS NOTE  Redmond School, MD [address] [place] [address]  DIAGNOSIS: Limited stage (T1b, N2, M0) small cell lung cancer presented with left lower lobe/infrahilar mass and large mediastinal lymphadenopathy diagnosed in October 2017.  PRIOR THERAPY: 1) Systemic chemotherapy with cisplatin 60 MG/M2 on day 1 and etoposide 120 MG/M2 on days 1, 2 and 3 status post 1 cycle. This was discontinued secondary to intolerance. 2) Systemic chemotherapy with carboplatin for AUC of 4 on day 1 and etoposide 100 MG/M2 on days 1, 2 and 3 with Neulasta support on day 4 every 3 weeks. Status post 5 cycles. This was concurrent with radiation in [place], [place]. 3) prophylactic cranial irradiation.  CURRENT THERAPY: Observation.  INTERVAL HISTORY: Rebekah Henderson 61 y.o. female returns to the clinic today for three-month follow-up visit. The patient is feeling very well these days with no specific complaints. She has occasional headache. She denied having any visual changes. She denied having any weakness or fatigue. She has no chest pain, shortness of breath, cough or hemoptysis. She denied having any recent weight loss or night sweats. She had repeat CT scan of the chest performed recently and she is here for evaluation and discussion of her scan results.   MEDICAL HISTORY: Past Medical History:  Diagnosis Date  . 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 Anxiety    takes Prozac daily  . Arthritis   . Colon polyps   . COPD (chronic obstructive pulmonary disease) ([place])   . Dehydration 03/06/2017  . Diverticulitis   . Dyspnea    with exertion  . Encounter for antineoplastic chemotherapy 12/03/2016  . Fibromyalgia   . GERD (gastroesophageal reflux disease)    takes Pantoprazole daily  . Hypertension    takes Metoprolol,Triamterene-HCTZ,and Amlodipine daily  . Hypothyroidism    takes  Synthroid daily  . IBS (irritable bowel syndrome)   . lung ca dx'd 10/02/2016   skin, lung  . PONV (postoperative nausea and vomiting)    pt also states that she had some difficulty breathing after cervical fusion    ALLERGIES:  is allergic to penicillins; codeine; ranitidine hcl; keflex [cephalexin]; and lyrica [pregabalin].  MEDICATIONS:  Current Outpatient Prescriptions  Medication Sig Dispense Refill  . albuterol (PROVENTIL HFA;VENTOLIN HFA) 108 (90 Base) MCG/ACT inhaler Inhale 2 puffs into the lungs every 4 (four) hours as needed for wheezing or shortness of breath.    Marland Kitchen amLODipine (NORVASC) 5 MG tablet Take 5 mg by mouth daily.      Marland Kitchen dicyclomine (BENTYL) 20 MG tablet Take 20 mg by mouth 3 (three) times daily as needed for spasms.    Marland Kitchen estazolam (PROSOM) 2 MG tablet Take 2 mg by mouth at bedtime.    Marland Kitchen estradiol (ESTRACE) 2 MG tablet Take 2 mg by mouth daily.      Marland Kitchen FLUoxetine (PROZAC) 40 MG capsule Take 40 mg by mouth daily.   2  . HYDROcodone-acetaminophen (NORCO/VICODIN) 5-325 MG tablet Take 1 tablet by mouth every 4 (four) hours as needed for moderate pain.   0  . levothyroxine (SYNTHROID, LEVOTHROID) 25 MCG tablet Take 25 mcg by mouth daily before breakfast.     . loperamide (IMODIUM) 2 MG capsule Take 2 mg by mouth every 2 (two) hours as needed for diarrhea or loose stools.    . magnesium oxide (MAG-OX) 400 (241.3 Mg) MG tablet Take 1 tab twice a  day x 7 days then 1 tab daily. 40 tablet 0  . metoprolol succinate (TOPROL-XL) 25 MG 24 hr tablet Take 25 mg by mouth daily.    . Multiple Vitamin (MULTIVITAMIN WITH MINERALS) TABS tablet Take 2 tablets by mouth daily.    . ondansetron (ZOFRAN ODT) 4 MG disintegrating tablet Take 1 tablet (4 mg total) by mouth every 8 (eight) hours as needed for nausea or vomiting. 20 tablet 0  . pantoprazole (PROTONIX) 40 MG tablet Take 40 mg by mouth 2 (two) times daily before a meal.   10  . polyethylene glycol powder (GLYCOLAX/MIRALAX) powder Take  17 g by mouth daily as needed for mild constipation. 255 g 0  . potassium chloride SA (K-DUR,KLOR-CON) 20 MEQ tablet Take 2 tabs daily twice a day for 7 days then 1 tab daily 40 tablet 0  . promethazine (PHENERGAN) 25 MG tablet Take 25 mg by mouth every 6 (six) hours as needed for nausea/vomiting.  10   No current facility-administered medications for this visit.     SURGICAL HISTORY:  Past Surgical History:  Procedure Laterality Date  . BIOPSY N/A 05/25/2013   Procedure: BIOPSIES (Random Colon; Duodenal; Gastric);  Surgeon: Danie Binder, MD;  Location: [facility];  Service: Endoscopy;  Laterality: N/A;  . BLADDER SUSPENSION    . BREAST ENHANCEMENT SURGERY    . BREAST IMPLANT REMOVAL    . CERVICAL FUSION  AUG 2013  . CHOLECYSTECTOMY  1999  . COLONOSCOPY  2007 [place]   POLYPS  . COLONOSCOPY WITH PROPOFOL N/A 05/25/2013   Procedure: COLONOSCOPY WITH PROPOFOL(at cecum 0957) total withdrawal time=31min);  Surgeon: Danie Binder, MD;  Location: [facility];  Service: Endoscopy;  Laterality: N/A;  . ESOPHAGOGASTRODUODENOSCOPY (EGD) WITH PROPOFOL N/A 05/25/2013   Procedure: ESOPHAGOGASTRODUODENOSCOPY (EGD) WITH PROPOFOL;  Surgeon: Danie Binder, MD;  Location: [facility];  Service: Endoscopy;  Laterality: N/A;  . FOOT SURGERY    . POLYPECTOMY N/A 05/25/2013   Procedure: POLYPECTOMY (Rectal and Gastric);  Surgeon: Danie Binder, MD;  Location: [facility];  Service: Endoscopy;  Laterality: N/A;  . TONSILLECTOMY    . UPPER GASTROINTESTINAL ENDOSCOPY    . VIDEO BRONCHOSCOPY WITH ENDOBRONCHIAL ULTRASOUND  09/12/2016   Procedure: VIDEO BRONCHOSCOPY WITH ENDOBRONCHIAL ULTRASOUND AND BIOPSY;  Surgeon: Juanito Doom, MD;  Location: [facility];  Service: Cardiopulmonary;;    REVIEW OF SYSTEMS:  A comprehensive review of systems was negative.   PHYSICAL EXAMINATION: General appearance: alert, cooperative and no distress Head: Normocephalic, without obvious abnormality, atraumatic Neck: no adenopathy, no JVD,  supple, symmetrical, trachea midline and thyroid not enlarged, symmetric, no tenderness/mass/nodules Lymph nodes: Cervical, supraclavicular, and axillary nodes normal. Resp: clear to auscultation bilaterally Back: symmetric, no curvature. ROM normal. No CVA tenderness. Cardio: regular rate and rhythm, S1, S2 normal, no murmur, click, rub or gallop GI: soft, non-tender; bowel sounds normal; no masses,  no organomegaly Extremities: extremities normal, atraumatic, no cyanosis or edema   ECOG PERFORMANCE STATUS: 1 - Symptomatic but completely ambulatory  Blood pressure 119/82, pulse 68, temperature 97.9 F (36.6 C), temperature source Oral, resp. rate 18, height 5\' 4"  (1.626 m), weight 166 lb 12.8 oz (75.7 kg), SpO2 91 %.  LABORATORY DATA: Lab Results  Component Value Date   WBC 7.5 08/22/2017   HGB 13.2 08/22/2017   HCT 38.7 08/22/2017   MCV 94.9 08/22/2017   PLT 319 08/22/2017      Chemistry      Component Value Date/Time  NA 141 09/08/2017 1009   K 3.5 09/08/2017 1009   CL 104 08/22/2017 1231   CO2 29 09/08/2017 1009   BUN 5.8 (L) 09/08/2017 1009   CREATININE 0.7 09/08/2017 1009      Component Value Date/Time   CALCIUM 9.0 09/08/2017 1009   ALKPHOS 49 09/08/2017 1009   AST 9 09/08/2017 1009   ALT <6 09/08/2017 1009   BILITOT 0.51 09/08/2017 1009       RADIOGRAPHIC STUDIES: Ct Chest W Contrast  Result Date: 09/08/2017 CLINICAL DATA:  Followup lung cancer EXAM: CT CHEST WITH CONTRAST TECHNIQUE: Multidetector CT imaging of the chest was performed during intravenous contrast administration. CONTRAST:  75 cc of Isovue-300 COMPARISON:  06/06/2017 FINDINGS: Cardiovascular: The heart size is mildly enlarged. Aortic atherosclerosis. No pericardial effusion. Mediastinum/Nodes: Trachea appears patent and is midline. Large hiatal hernia noted. AP window soft tissue mass measures 2.7 x 2.3 cm, image 50 of series 2. Previously 3.3 x 2.5 cm. No additional enlarged mediastinal or hilar  lymph nodes. Lungs/Pleura: Bilateral areas of paramediastinal masslike architectural distortion and fibrosis identified. There is progressive fibrotic changes and masslike architectural distortion involving the posterolateral right upper lobe, image 57 of series 5 and posterior and posterolateral right lower lobe. No suspicious pulmonary parenchymal nodule or mass identified. Upper Abdomen: No acute abnormality. Musculoskeletal: No aggressive lytic or sclerotic bone lesions. IMPRESSION: 1. AP window mass is slightly decreased in size from previous exam. No new or progressive mediastinal adenopathy. 2. Changes of external beam radiation identified in both lungs. There is a progressive area of fibrosis and masslike architectural distortion involving the posterior right upper lobe and superior segment of right lower lobe noted on today's exam. 3. Aortic atherosclerosis. Electronically Signed   By: Kerby Moors M.D.   On: 09/08/2017 13:42    ASSESSMENT AND PLAN:  This is a very pleasant 61 years old white female was limited stage small cell lung cancer, status post 6 cycles of systemic chemotherapy with carboplatin and etoposide concurrent with radiation and followed by prophylactic cranial irradiation. The patient is feeling fine today with no specific complaints. She is currently on observation and feeling very well. Her recent CT scan of the chest showed no concerning findings for disease recurrence. I recommended for the patient to continue on observation with repeat CT scan of the chest in 3 months. The patient was advised to call immediately if she has any concerning symptoms in the interval. The patient voices understanding of current disease status and treatment options and is in agreement with the current care plan. All questions were answered. The patient knows to call the clinic with any problems, questions or concerns. We can certainly see the patient much sooner if necessary. I spent 10  minutes counseling the patient face to face. The total time spent in the appointment was 15 minutes.  Disclaimer: This note was dictated with voice recognition software. Similar sounding words can inadvertently be transcribed and may not be corrected upon review.

## 2017-09-17 ENCOUNTER — Telehealth: Payer: Self-pay | Admitting: Internal Medicine

## 2017-09-17 NOTE — Telephone Encounter (Signed)
Left voicemail for patient regarding added appts per 10/10 los. Sending confirmation letter in the mail.

## 2017-09-19 ENCOUNTER — Ambulatory Visit (HOSPITAL_COMMUNITY)
Admission: RE | Admit: 2017-09-19 | Discharge: 2017-09-19 | Disposition: A | Discharge status: Home / Self Care | Payer: Medicare Other | Source: Ambulatory Visit | Attending: Radiation Oncology | Admitting: Radiation Oncology

## 2017-09-19 DIAGNOSIS — H748X2 Other specified disorders of left middle ear and mastoid: Secondary | ICD-10-CM | POA: Diagnosis not present

## 2017-09-19 DIAGNOSIS — C7931 Secondary malignant neoplasm of brain: Secondary | ICD-10-CM | POA: Diagnosis not present

## 2017-09-19 DIAGNOSIS — R51 Headache: Secondary | ICD-10-CM | POA: Diagnosis not present

## 2017-09-19 MED ORDER — GADOBENATE DIMEGLUMINE 529 MG/ML IV SOLN
20.0000 mL | Freq: Once | INTRAVENOUS | Status: AC | PRN
  Administered 2017-09-19: 15 mL via INTRAVENOUS

## 2017-09-23 DIAGNOSIS — J189 Pneumonia, unspecified organism: Secondary | ICD-10-CM | POA: Diagnosis not present

## 2017-09-23 DIAGNOSIS — E663 Overweight: Secondary | ICD-10-CM | POA: Diagnosis not present

## 2017-09-23 DIAGNOSIS — Z6829 Body mass index (BMI) 29.0-29.9, adult: Secondary | ICD-10-CM | POA: Diagnosis not present

## 2017-09-23 DIAGNOSIS — J449 Chronic obstructive pulmonary disease, unspecified: Secondary | ICD-10-CM | POA: Diagnosis not present

## 2017-10-01 DIAGNOSIS — J449 Chronic obstructive pulmonary disease, unspecified: Secondary | ICD-10-CM | POA: Diagnosis not present

## 2017-10-01 DIAGNOSIS — R0902 Hypoxemia: Secondary | ICD-10-CM | POA: Diagnosis not present

## 2017-10-13 ENCOUNTER — Other Ambulatory Visit: Payer: Self-pay | Admitting: Internal Medicine

## 2017-10-13 DIAGNOSIS — R112 Nausea with vomiting, unspecified: Secondary | ICD-10-CM

## 2017-10-31 ENCOUNTER — Other Ambulatory Visit (HOSPITAL_COMMUNITY): Payer: Self-pay | Admitting: Internal Medicine

## 2017-10-31 DIAGNOSIS — Z1231 Encounter for screening mammogram for malignant neoplasm of breast: Secondary | ICD-10-CM

## 2017-10-31 DIAGNOSIS — R0902 Hypoxemia: Secondary | ICD-10-CM | POA: Diagnosis not present

## 2017-10-31 DIAGNOSIS — J449 Chronic obstructive pulmonary disease, unspecified: Secondary | ICD-10-CM | POA: Diagnosis not present

## 2017-11-03 ENCOUNTER — Ambulatory Visit (HOSPITAL_COMMUNITY)
Admission: RE | Admit: 2017-11-03 | Discharge: 2017-11-03 | Disposition: A | Discharge status: Home / Self Care | Payer: Medicare Other | Source: Ambulatory Visit | Attending: Internal Medicine | Admitting: Internal Medicine

## 2017-11-03 DIAGNOSIS — J449 Chronic obstructive pulmonary disease, unspecified: Secondary | ICD-10-CM | POA: Diagnosis not present

## 2017-11-03 DIAGNOSIS — Z6829 Body mass index (BMI) 29.0-29.9, adult: Secondary | ICD-10-CM | POA: Diagnosis not present

## 2017-11-03 DIAGNOSIS — C3492 Malignant neoplasm of unspecified part of left bronchus or lung: Secondary | ICD-10-CM | POA: Diagnosis not present

## 2017-11-03 DIAGNOSIS — Z1231 Encounter for screening mammogram for malignant neoplasm of breast: Secondary | ICD-10-CM | POA: Insufficient documentation

## 2017-11-03 DIAGNOSIS — Z1389 Encounter for screening for other disorder: Secondary | ICD-10-CM | POA: Diagnosis not present

## 2017-11-03 DIAGNOSIS — G894 Chronic pain syndrome: Secondary | ICD-10-CM | POA: Diagnosis not present

## 2017-11-14 DIAGNOSIS — Q828 Other specified congenital malformations of skin: Secondary | ICD-10-CM | POA: Diagnosis not present

## 2017-11-14 DIAGNOSIS — M79671 Pain in right foot: Secondary | ICD-10-CM | POA: Diagnosis not present

## 2017-11-19 ENCOUNTER — Other Ambulatory Visit: Payer: Self-pay | Admitting: Internal Medicine

## 2017-11-19 DIAGNOSIS — C3492 Malignant neoplasm of unspecified part of left bronchus or lung: Secondary | ICD-10-CM

## 2017-12-01 DIAGNOSIS — R0902 Hypoxemia: Secondary | ICD-10-CM | POA: Diagnosis not present

## 2017-12-01 DIAGNOSIS — J449 Chronic obstructive pulmonary disease, unspecified: Secondary | ICD-10-CM | POA: Diagnosis not present

## 2017-12-05 ENCOUNTER — Telehealth: Payer: Self-pay | Admitting: Medical Oncology

## 2017-12-05 NOTE — Telephone Encounter (Signed)
Appointments confirmed.

## 2017-12-08 ENCOUNTER — Inpatient Hospital Stay: Payer: Medicare Other | Attending: Internal Medicine

## 2017-12-08 ENCOUNTER — Encounter (HOSPITAL_COMMUNITY): Payer: Self-pay

## 2017-12-08 ENCOUNTER — Ambulatory Visit (HOSPITAL_COMMUNITY)
Admission: RE | Admit: 2017-12-08 | Discharge: 2017-12-08 | Disposition: A | Discharge status: Home / Self Care | Payer: Medicare Other | Source: Ambulatory Visit | Attending: Internal Medicine | Admitting: Internal Medicine

## 2017-12-08 DIAGNOSIS — Z923 Personal history of irradiation: Secondary | ICD-10-CM | POA: Diagnosis not present

## 2017-12-08 DIAGNOSIS — C3432 Malignant neoplasm of lower lobe, left bronchus or lung: Secondary | ICD-10-CM | POA: Diagnosis not present

## 2017-12-08 DIAGNOSIS — Z9221 Personal history of antineoplastic chemotherapy: Secondary | ICD-10-CM | POA: Insufficient documentation

## 2017-12-08 DIAGNOSIS — C3492 Malignant neoplasm of unspecified part of left bronchus or lung: Secondary | ICD-10-CM

## 2017-12-08 DIAGNOSIS — I700 Atherosclerosis of aorta: Secondary | ICD-10-CM | POA: Diagnosis not present

## 2017-12-08 DIAGNOSIS — Y842 Radiological procedure and radiotherapy as the cause of abnormal reaction of the patient, or of later complication, without mention of misadventure at the time of the procedure: Secondary | ICD-10-CM | POA: Diagnosis not present

## 2017-12-08 DIAGNOSIS — R413 Other amnesia: Secondary | ICD-10-CM | POA: Diagnosis not present

## 2017-12-08 LAB — CBC WITH DIFFERENTIAL/PLATELET
ABS GRANULOCYTE: 4.2 10*3/uL (ref 1.5–6.5)
Basophils Absolute: 0 10*3/uL (ref 0.0–0.1)
Basophils Relative: 0 %
Eosinophils Absolute: 0.2 10*3/uL (ref 0.0–0.5)
Eosinophils Relative: 2 %
HEMATOCRIT: 40.6 % (ref 34.8–46.6)
Hemoglobin: 13.8 g/dL (ref 11.6–15.9)
LYMPHS ABS: 1.6 10*3/uL (ref 0.9–3.3)
Lymphocytes Relative: 24 %
MCH: 31.7 pg (ref 25.1–34.0)
MCHC: 34 g/dL (ref 31.5–36.0)
MCV: 93.1 fL (ref 79.5–101.0)
MONO ABS: 0.6 10*3/uL (ref 0.1–0.9)
MONOS PCT: 10 %
NEUTROS ABS: 4.2 10*3/uL (ref 1.5–6.5)
Neutrophils Relative %: 64 %
Platelets: 173 10*3/uL (ref 145–400)
RBC: 4.36 MIL/uL (ref 3.70–5.45)
RDW: 14.2 % (ref 11.2–16.1)
WBC: 6.5 10*3/uL (ref 3.9–10.3)

## 2017-12-08 LAB — COMPREHENSIVE METABOLIC PANEL
ALBUMIN: 3.6 g/dL (ref 3.5–5.0)
ALK PHOS: 39 U/L — AB (ref 40–150)
ALT: 7 U/L (ref 0–55)
AST: 9 U/L (ref 5–34)
Anion gap: 9 (ref 3–11)
BILIRUBIN TOTAL: 0.6 mg/dL (ref 0.2–1.2)
BUN: 7 mg/dL (ref 7–26)
CALCIUM: 9 mg/dL (ref 8.4–10.4)
CO2: 24 mmol/L (ref 22–29)
Chloride: 106 mmol/L (ref 98–109)
Creatinine, Ser: 0.7 mg/dL (ref 0.60–1.10)
GFR calc Af Amer: >60 mL/min (ref >60)
GFR calc non Af Amer: >60 mL/min (ref >60)
GLUCOSE: 88 mg/dL (ref 70–140)
Potassium: 3.7 mmol/L (ref 3.3–4.7)
SODIUM: 139 mmol/L (ref 136–145)
TOTAL PROTEIN: 6.7 g/dL (ref 6.4–8.3)

## 2017-12-08 MED ORDER — IOPAMIDOL (ISOVUE-300) INJECTION 61%
75.0000 mL | Freq: Once | INTRAVENOUS | Status: AC | PRN
  Administered 2017-12-08: 75 mL via INTRAVENOUS

## 2017-12-08 MED ORDER — IOPAMIDOL (ISOVUE-300) INJECTION 61%
INTRAVENOUS | Status: AC
  Filled 2017-12-08: qty 75

## 2017-12-09 ENCOUNTER — Other Ambulatory Visit: Payer: Medicare Other

## 2017-12-09 ENCOUNTER — Encounter: Payer: Self-pay | Admitting: Internal Medicine

## 2017-12-09 ENCOUNTER — Inpatient Hospital Stay (HOSPITAL_BASED_OUTPATIENT_CLINIC_OR_DEPARTMENT_OTHER): Payer: Medicare Other | Admitting: Internal Medicine

## 2017-12-09 VITALS — BP 118/49 | HR 63 | Temp 97.7°F | Resp 18 | Ht 64.0 in | Wt 169.4 lb

## 2017-12-09 DIAGNOSIS — C3432 Malignant neoplasm of lower lobe, left bronchus or lung: Secondary | ICD-10-CM

## 2017-12-09 DIAGNOSIS — R413 Other amnesia: Secondary | ICD-10-CM

## 2017-12-09 DIAGNOSIS — Z9221 Personal history of antineoplastic chemotherapy: Secondary | ICD-10-CM

## 2017-12-09 DIAGNOSIS — Z923 Personal history of irradiation: Secondary | ICD-10-CM

## 2017-12-09 DIAGNOSIS — C3492 Malignant neoplasm of unspecified part of left bronchus or lung: Secondary | ICD-10-CM

## 2017-12-09 NOTE — Progress Notes (Signed)
[place] Telephone:(336) [phone]   Fax:(336) [phone]  OFFICE PROGRESS NOTE  Redmond School, MD [address] [place] [address]  DIAGNOSIS: Limited stage (T1b, N2, M0) small cell lung cancer presented with left lower lobe/infrahilar mass and large mediastinal lymphadenopathy diagnosed in October 2017.  PRIOR THERAPY: 1) Systemic chemotherapy with cisplatin 60 MG/M2 on day 1 and etoposide 120 MG/M2 on days 1, 2 and 3 status post 1 cycle. This was discontinued secondary to intolerance. 2) Systemic chemotherapy with carboplatin for AUC of 4 on day 1 and etoposide 100 MG/M2 on days 1, 2 and 3 with Neulasta support on day 4 every 3 weeks. Status post 5 cycles. This was concurrent with radiation in [place], [place]. 3) prophylactic cranial irradiation.  CURRENT THERAPY: Observation.  INTERVAL HISTORY: Rebekah Henderson 62 y.o. female returns to the clinic today for follow-up visit.  The patient is feeling fine today with no specific complaints except for occasional memory loss.  She denied having any chest pain, shortness of breath, cough or hemoptysis.  She denied having any recent weight loss or night sweats.  She has no nausea, vomiting, diarrhea or constipation.  She is currently on observation and feeling well.  She had repeat CT scan of the chest performed recently and she is here for evaluation and discussion of her scan results.  MEDICAL HISTORY: Past Medical History:  Diagnosis Date  . 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 Anxiety    takes Prozac daily  . Arthritis   . Colon polyps   . COPD (chronic obstructive pulmonary disease) ([place])   . Dehydration 03/06/2017  . Diverticulitis   . Dyspnea    with exertion  . Encounter for antineoplastic chemotherapy 12/03/2016  . Fibromyalgia   . GERD (gastroesophageal reflux disease)    takes Pantoprazole daily  . Hypertension    takes Metoprolol,Triamterene-HCTZ,and Amlodipine daily  .  Hypothyroidism    takes Synthroid daily  . IBS (irritable bowel syndrome)   . lung ca dx'd 10/02/2016   skin, lung  . PONV (postoperative nausea and vomiting)    pt also states that she had some difficulty breathing after cervical fusion    ALLERGIES:  is allergic to penicillins; codeine; ranitidine hcl; keflex [cephalexin]; and lyrica [pregabalin].  MEDICATIONS:  Current Outpatient Medications  Medication Sig Dispense Refill  . albuterol (PROVENTIL HFA;VENTOLIN HFA) 108 (90 Base) MCG/ACT inhaler Inhale 2 puffs into the lungs every 4 (four) hours as needed for wheezing or shortness of breath.    Marland Kitchen amLODipine (NORVASC) 5 MG tablet Take 5 mg by mouth daily.      Marland Kitchen dicyclomine (BENTYL) 20 MG tablet Take 20 mg by mouth 3 (three) times daily as needed for spasms.    Marland Kitchen estazolam (PROSOM) 2 MG tablet Take 2 mg by mouth at bedtime.    Marland Kitchen estradiol (ESTRACE) 2 MG tablet Take 2 mg by mouth daily.      Marland Kitchen FLUoxetine (PROZAC) 40 MG capsule Take 40 mg by mouth daily.   2  . HYDROcodone-acetaminophen (NORCO/VICODIN) 5-325 MG tablet Take 1 tablet by mouth every 4 (four) hours as needed for moderate pain.   0  . levothyroxine (SYNTHROID, LEVOTHROID) 25 MCG tablet Take 25 mcg by mouth daily before breakfast.     . loperamide (IMODIUM) 2 MG capsule Take 2 mg by mouth every 2 (two) hours as needed for diarrhea or loose stools.    . magnesium oxide (MAG-OX) 400 (241.3 Mg)  MG tablet Take 1 tab twice a day x 7 days then 1 tab daily. 40 tablet 0  . metoprolol succinate (TOPROL-XL) 25 MG 24 hr tablet Take 25 mg by mouth daily.    . Multiple Vitamin (MULTIVITAMIN WITH MINERALS) TABS tablet Take 2 tablets by mouth daily.    . ondansetron (ZOFRAN-ODT) 4 MG disintegrating tablet DISSOLVE 1 TABLET BY MOUTH EVERY 8 HOURS AS NEEDED FOR NAUSEA AND VOMITING. 20 tablet 0  . pantoprazole (PROTONIX) 40 MG tablet Take 40 mg by mouth 2 (two) times daily before a meal.   10  . polyethylene glycol powder (GLYCOLAX/MIRALAX) powder  Take 17 g by mouth daily as needed for mild constipation. 255 g 0  . potassium chloride SA (K-DUR,KLOR-CON) 20 MEQ tablet Take 2 tabs daily twice a day for 7 days then 1 tab daily 40 tablet 0  . promethazine (PHENERGAN) 25 MG tablet Take 25 mg by mouth every 6 (six) hours as needed for nausea/vomiting.  10   No current facility-administered medications for this visit.     SURGICAL HISTORY:  Past Surgical History:  Procedure Laterality Date  . BIOPSY N/A 05/25/2013   Procedure: BIOPSIES (Random Colon; Duodenal; Gastric);  Surgeon: Danie Binder, MD;  Location: [facility];  Service: Endoscopy;  Laterality: N/A;  . BLADDER SUSPENSION    . BREAST ENHANCEMENT SURGERY    . BREAST IMPLANT REMOVAL    . CERVICAL FUSION  AUG 2013  . CHOLECYSTECTOMY  1999  . COLONOSCOPY  2007 [place]   POLYPS  . COLONOSCOPY WITH PROPOFOL N/A 05/25/2013   Procedure: COLONOSCOPY WITH PROPOFOL(at cecum 0957) total withdrawal time=32min);  Surgeon: Danie Binder, MD;  Location: [facility];  Service: Endoscopy;  Laterality: N/A;  . ESOPHAGOGASTRODUODENOSCOPY (EGD) WITH PROPOFOL N/A 05/25/2013   Procedure: ESOPHAGOGASTRODUODENOSCOPY (EGD) WITH PROPOFOL;  Surgeon: Danie Binder, MD;  Location: [facility];  Service: Endoscopy;  Laterality: N/A;  . FOOT SURGERY    . POLYPECTOMY N/A 05/25/2013   Procedure: POLYPECTOMY (Rectal and Gastric);  Surgeon: Danie Binder, MD;  Location: [facility];  Service: Endoscopy;  Laterality: N/A;  . TONSILLECTOMY    . UPPER GASTROINTESTINAL ENDOSCOPY    . VIDEO BRONCHOSCOPY WITH ENDOBRONCHIAL ULTRASOUND  09/12/2016   Procedure: VIDEO BRONCHOSCOPY WITH ENDOBRONCHIAL ULTRASOUND AND BIOPSY;  Surgeon: Juanito Doom, MD;  Location: [facility];  Service: Cardiopulmonary;;    REVIEW OF SYSTEMS:  A comprehensive review of systems was negative.   PHYSICAL EXAMINATION: General appearance: alert, cooperative and no distress Head: Normocephalic, without obvious abnormality, atraumatic Neck: no adenopathy, no JVD,  supple, symmetrical, trachea midline and thyroid not enlarged, symmetric, no tenderness/mass/nodules Lymph nodes: Cervical, supraclavicular, and axillary nodes normal. Resp: clear to auscultation bilaterally Back: symmetric, no curvature. ROM normal. No CVA tenderness. Cardio: regular rate and rhythm, S1, S2 normal, no murmur, click, rub or gallop GI: soft, non-tender; bowel sounds normal; no masses,  no organomegaly Extremities: extremities normal, atraumatic, no cyanosis or edema   ECOG PERFORMANCE STATUS: 1 - Symptomatic but completely ambulatory  Blood pressure (!) 118/49, pulse 63, temperature 97.7 F (36.5 C), temperature source Oral, resp. rate 18, height 5\' 4"  (1.626 m), weight 169 lb 6.4 oz (76.8 kg), SpO2 96 %.  LABORATORY DATA: Lab Results  Component Value Date   WBC 6.5 12/08/2017   HGB 13.8 12/08/2017   HCT 40.6 12/08/2017   MCV 93.1 12/08/2017   PLT 173 12/08/2017      Chemistry      Component Value  Date/Time   NA 139 12/08/2017 1045   NA 141 09/08/2017 1009   K 3.7 12/08/2017 1045   K 3.5 09/08/2017 1009   CL 106 12/08/2017 1045   CO2 24 12/08/2017 1045   CO2 29 09/08/2017 1009   BUN 7 12/08/2017 1045   BUN 5.8 (L) 09/08/2017 1009   CREATININE 0.70 12/08/2017 1045   CREATININE 0.7 09/08/2017 1009      Component Value Date/Time   CALCIUM 9.0 12/08/2017 1045   CALCIUM 9.0 09/08/2017 1009   ALKPHOS 39 (L) 12/08/2017 1045   ALKPHOS 49 09/08/2017 1009   AST 9 12/08/2017 1045   AST 9 09/08/2017 1009   ALT 7 12/08/2017 1045   ALT <6 09/08/2017 1009   BILITOT 0.6 12/08/2017 1045   BILITOT 0.51 09/08/2017 1009       RADIOGRAPHIC STUDIES: Ct Chest W Contrast  Result Date: 12/08/2017 CLINICAL DATA:  LEFT lung cancer diagnosed October 2017 EXAM: CT CHEST WITH CONTRAST TECHNIQUE: Multidetector CT imaging of the chest was performed during intravenous contrast administration. CONTRAST:  35mL ISOVUE-300 IOPAMIDOL (ISOVUE-300) INJECTION 61% COMPARISON:  09/08/2017  FINDINGS: Cardiovascular: No significant vascular findings. Normal heart size. No pericardial effusion. Mediastinum/Nodes: No axillary supraclavicular adenopathy. No mediastinal hilar adenopathy. No pericardial effusion. Esophagus normal. Moderate hiatal hernia. There is soft tissue in the AP window measuring 3.6 x 2.5 mm. This measures slightly larger in axial dimension from 2.7 by 2.4 cm on comparison exam. In sagittal imaging (image 116, series 7) lesion is slightly larger (19 mm compared to 17 mm). Lungs/Pleura: No perihilar fibrotic change on the LEFT with mild bronchiectasis consists with radiation fibrosis. Pattern of parenchymal changes similar to comparison exam. There is resolution of the ground-glass opacities in the RIGHT lung seen on comparison. No new or suspicious pulmonary nodularity. Upper Abdomen: Limited view of the liver, kidneys, pancreas are unremarkable. Normal adrenal glands. Musculoskeletal: No aggressive osseous lesion. Anterior cervical fusion IMPRESSION: 1. Stable perihilar radiation change in the LEFT lung. 2. Soft tissue in the AP window measures slightly larger. Recommend close attention on CT follow-up versus FDG PET scan. 3. Resolution of ground-glass opacities in the RIGHT lung. Aortic Atherosclerosis (ICD10-I70.0). Electronically Signed   By: Suzy Bouchard M.D.   On: 12/08/2017 13:40    ASSESSMENT AND PLAN:  This is a very pleasant 62 years old white female was limited stage small cell lung cancer, status post 6 cycles of systemic chemotherapy with carboplatin and etoposide concurrent with radiation and followed by prophylactic cranial irradiation. The patient is currently on observation and has no concerning complaints except for the occasional memory loss. She had repeat CT scan of the chest.  I personally and independently reviewed the scan images and discussed the results and showed the images to the patient today.  Her scan showed no concerning findings for disease  progression except for a slightly larger AP window soft tissue mass. I recommended for the patient to continue on observation with repeat CT scan of the chest in 2 months for further evaluation of this lesion and to rule out any disease progression period She was advised to call immediately if she has any concerning symptoms in the interval. The patient voices understanding of current disease status and treatment options and is in agreement with the current care plan. All questions were answered. The patient knows to call the clinic with any problems, questions or concerns. We can certainly see the patient much sooner if necessary. I spent 10 minutes counseling the patient  face to face. The total time spent in the appointment was 15 minutes.  Disclaimer: This note was dictated with voice recognition software. Similar sounding words can inadvertently be transcribed and may not be corrected upon review.

## 2017-12-24 DIAGNOSIS — C7931 Secondary malignant neoplasm of brain: Secondary | ICD-10-CM | POA: Diagnosis not present

## 2017-12-24 DIAGNOSIS — C3412 Malignant neoplasm of upper lobe, left bronchus or lung: Secondary | ICD-10-CM | POA: Diagnosis not present

## 2017-12-25 ENCOUNTER — Other Ambulatory Visit: Payer: Self-pay | Admitting: Internal Medicine

## 2017-12-25 DIAGNOSIS — R112 Nausea with vomiting, unspecified: Secondary | ICD-10-CM

## 2018-01-01 DIAGNOSIS — R0902 Hypoxemia: Secondary | ICD-10-CM | POA: Diagnosis not present

## 2018-01-01 DIAGNOSIS — J449 Chronic obstructive pulmonary disease, unspecified: Secondary | ICD-10-CM | POA: Diagnosis not present

## 2018-01-12 ENCOUNTER — Ambulatory Visit: Payer: Medicare Other | Admitting: Gastroenterology

## 2018-01-12 ENCOUNTER — Encounter: Payer: Self-pay | Admitting: Gastroenterology

## 2018-01-12 VITALS — BP 118/68 | HR 74 | Ht 64.0 in | Wt 170.2 lb

## 2018-01-12 DIAGNOSIS — K580 Irritable bowel syndrome with diarrhea: Secondary | ICD-10-CM

## 2018-01-12 DIAGNOSIS — K219 Gastro-esophageal reflux disease without esophagitis: Secondary | ICD-10-CM

## 2018-01-12 DIAGNOSIS — R1310 Dysphagia, unspecified: Secondary | ICD-10-CM | POA: Diagnosis not present

## 2018-01-12 NOTE — Addendum Note (Signed)
Addended by: Marzella Schlein on: 01/12/2018 03:25 PM   Modules accepted: Orders, SmartSet

## 2018-01-12 NOTE — Patient Instructions (Signed)
Take your dicyclomine three times a day as needed, not as needed.   You have been scheduled for a Barium Esophogram at [facility] (1st floor of the hospital) on 01/22/18 at 11:00am. Please arrive 15 minutes prior to your appointment for registration. Make certain not to have anything to eat or drink 6 hours prior to your test. If you need to reschedule for any reason, please contact radiology at [phone] to do so. __________________________________________________________________ A barium swallow is an examination that concentrates on views of the esophagus. This tends to be a double contrast exam (barium and two liquids which, when combined, create a gas to distend the wall of the oesophagus) or single contrast (non-ionic iodine based). The study is usually tailored to your symptoms so a good history is essential. Attention is paid during the study to the form, structure and configuration of the esophagus, looking for functional disorders (such as aspiration, dysphagia, achalasia, motility and reflux) EXAMINATION You may be asked to change into a gown, depending on the type of swallow being performed. A radiologist and radiographer will perform the procedure. The radiologist will advise you of the type of contrast selected for your procedure and direct you during the exam. You will be asked to stand, sit or lie in several different positions and to hold a small amount of fluid in your mouth before being asked to swallow while the imaging is performed .In some instances you may be asked to swallow barium coated marshmallows to assess the motility of a solid food bolus. The exam can be recorded as a digital or video fluoroscopy procedure. POST PROCEDURE It will take 1-2 days for the barium to pass through your system. To facilitate this, it is important, unless otherwise directed, to increase your fluids for the next 24-48hrs and to resume your normal diet.  This test typically takes about 30  minutes to perform. _________________________________________________________________________  Rebekah Henderson have been scheduled for an endoscopy. Please follow written instructions given to you at your visit today. If you use inhalers (even only as needed), please bring them with you on the day of your procedure. Your physician has requested that you go to www.startemmi.com and enter the access code given to you at your visit today. This web site gives a general overview about your procedure. However, you should still follow specific instructions given to you by our office regarding your preparation for the procedure.  Thank you for choosing me and [facility].  Pricilla Riffle. Dagoberto Ligas., MD., [facility] Regal

## 2018-01-12 NOTE — Progress Notes (Signed)
History of Present Illness: This is a 62 year old female referred by Redmond School, MD for the evaluation of dysphagia, GERD, abdominal pain and diarrhea. Previously followed by Dr. Barney Drain with a h/o IBS-D, fatty liver and GERD.  Colonoscopy in June 2014 by Dr. Oneida Alar showed left colon diverticulosis internal hemorrhoids and small polyps.  Polyps were hyperplastic and random biopsies were normal.  EGD in June 2014 by Dr. Oneida Alar showed a moderate sized hiatal hernia, erosive gastritis, gastric polyps, duodenal diverticulum. Biopsies showed a reactive gastropathy, benign fundic gland polypsand normal duodenal mucosa.  She was treated for IBS D and GERD.  She relates urgent postprandial diarrhea and intermittent crampy lower abdominal pain that is relieved with dicyclomine.  She uses Imodium as needed to control diarrhea.  The symptoms have not changed for years.  Her reflux symptoms have been more active for the past 2 months and she notes difficulty swallowing liquids and solids intermittently for the past 2 months.  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 Earlie Server. She notes slow gradual weight loss since she was diagnosed with lung cancer.  Denies constipation, change in stool caliber, melena, hematochezia, nausea, vomiting, chest pain.    Allergies  Allergen Reactions  . Penicillins Hives, Swelling and Other (See Comments)    Reaction:  Face/mouth swelling  Has patient had a PCN reaction causing immediate rash, facial/tongue/throat swelling, SOB or lightheadedness with hypotension: Yes Has patient had a PCN reaction causing severe rash involving mucus membranes or skin necrosis: No Has patient had a PCN reaction that required hospitalization No Has patient had a PCN reaction occurring within the last 10 years: No If all of the above answers are "NO", then may proceed with Cephalosporin use.  . Codeine Nausea Only  .  Ranitidine Hcl Other (See Comments)    Reaction:  Dizziness   . Keflex [Cephalexin] Other (See Comments)    Reaction:  Unknown   . Lyrica [Pregabalin] Other (See Comments)    Reaction:  Somnolence    Outpatient Medications Prior to Visit  Medication Sig Dispense Refill  . albuterol (PROVENTIL HFA;VENTOLIN HFA) 108 (90 Base) MCG/ACT inhaler Inhale 2 puffs into the lungs every 4 (four) hours as needed for wheezing or shortness of breath.    Marland Kitchen amLODipine (NORVASC) 5 MG tablet Take 5 mg by mouth daily.      Marland Kitchen dicyclomine (BENTYL) 20 MG tablet Take 20 mg by mouth 3 (three) times daily as needed for spasms.    Marland Kitchen estazolam (PROSOM) 2 MG tablet Take 2 mg by mouth at bedtime.    Marland Kitchen estradiol (ESTRACE) 2 MG tablet Take 2 mg by mouth daily.      Marland Kitchen FLUoxetine (PROZAC) 40 MG capsule Take 40 mg by mouth daily.   2  . HYDROcodone-acetaminophen (NORCO/VICODIN) 5-325 MG tablet Take 1 tablet by mouth every 4 (four) hours as needed for moderate pain.   0  . levothyroxine (SYNTHROID, LEVOTHROID) 25 MCG tablet Take 50 mcg by mouth daily before breakfast.     . loperamide (IMODIUM) 2 MG capsule Take 2 mg by mouth every 2 (two) hours as needed for diarrhea or loose stools.    . metoprolol succinate (TOPROL-XL) 25 MG 24 hr tablet Take 25 mg by mouth daily.    . ondansetron (ZOFRAN-ODT) 4 MG disintegrating tablet DISSOLVE 1 TABLET BY MOUTH EVERY 8 HOURS AS NEEDED FOR NAUSEA AND VOMITING. 20 tablet 0  .  pantoprazole (PROTONIX) 40 MG tablet Take 40 mg by mouth 2 (two) times daily before a meal.   10  . promethazine (PHENERGAN) 25 MG tablet Take 25 mg by mouth every 6 (six) hours as needed for nausea/vomiting.  10  . magnesium oxide (MAG-OX) 400 (241.3 Mg) MG tablet Take 1 tab twice a day x 7 days then 1 tab daily. 40 tablet 0  . Multiple Vitamin (MULTIVITAMIN WITH MINERALS) TABS tablet Take 2 tablets by mouth daily.    . polyethylene glycol powder (GLYCOLAX/MIRALAX) powder Take 17 g by mouth daily as needed for mild  constipation. 255 g 0  . POLYSACCHARIDE IRON COMPLEX PO Take by mouth.    . potassium chloride SA (K-DUR,KLOR-CON) 20 MEQ tablet Take 2 tabs daily twice a day for 7 days then 1 tab daily 40 tablet 0  . senna-docusate (SENOKOT-S) 8.6-50 MG tablet Take by mouth.    . triamterene-hydrochlorothiazide (DYAZIDE) 37.5-25 MG capsule Take by mouth.    . Vitamins/Minerals TABS Take by mouth.     No facility-administered medications prior to visit.    Past Medical History:  Diagnosis Date  . 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 Anxiety    takes Prozac daily  . Arthritis   . Benign fundic gland polyps of stomach   . Colon polyps   . COPD (chronic obstructive pulmonary disease) ([place])   . Dehydration 03/06/2017  . Diverticulitis   . Dyspnea    with exertion  . Encounter for antineoplastic chemotherapy 12/03/2016  . Fibromyalgia   . GERD (gastroesophageal reflux disease)    takes Pantoprazole daily  . Hypertension    takes Metoprolol,Triamterene-HCTZ,and Amlodipine daily  . Hypothyroidism    takes Synthroid daily  . IBS (irritable bowel syndrome)   . lung ca dx'd 10/02/2016   skin, lung  . PONV (postoperative nausea and vomiting)    pt also states that she had some difficulty breathing after cervical fusion   Past Surgical History:  Procedure Laterality Date  . BIOPSY N/A 05/25/2013   Procedure: BIOPSIES (Random Colon; Duodenal; Gastric);  Surgeon: Danie Binder, MD;  Location: [facility];  Service: Endoscopy;  Laterality: N/A;  . BLADDER SUSPENSION    . BREAST ENHANCEMENT SURGERY    . BREAST IMPLANT REMOVAL    . CERVICAL FUSION  AUG 2013  . CHOLECYSTECTOMY  1999  . COLONOSCOPY  2007 [place]   POLYPS  . COLONOSCOPY WITH PROPOFOL N/A 05/25/2013   Procedure: COLONOSCOPY WITH PROPOFOL(at cecum 0957) total withdrawal time=14min);  Surgeon: Danie Binder, MD;  Location: [facility];  Service: Endoscopy;  Laterality: N/A;  . ESOPHAGOGASTRODUODENOSCOPY (EGD) WITH PROPOFOL N/A  05/25/2013   Procedure: ESOPHAGOGASTRODUODENOSCOPY (EGD) WITH PROPOFOL;  Surgeon: Danie Binder, MD;  Location: [facility];  Service: Endoscopy;  Laterality: N/A;  . FOOT SURGERY    . POLYPECTOMY N/A 05/25/2013   Procedure: POLYPECTOMY (Rectal and Gastric);  Surgeon: Danie Binder, MD;  Location: [facility];  Service: Endoscopy;  Laterality: N/A;  . TONSILLECTOMY    . UPPER GASTROINTESTINAL ENDOSCOPY    . VIDEO BRONCHOSCOPY WITH ENDOBRONCHIAL ULTRASOUND  09/12/2016   Procedure: VIDEO BRONCHOSCOPY WITH ENDOBRONCHIAL ULTRASOUND AND BIOPSY;  Surgeon: Juanito Doom, MD;  Location: [facility];  Service: Cardiopulmonary;;   Social History   Socioeconomic History  . Marital status: Divorced    Spouse name: None  . Number of children: 1  . Years of education: College   . Highest education level: None  Social Needs  .  Financial resource strain: None  . Food insecurity - worry: None  . Food insecurity - inability: None  . Transportation needs - medical: None  . Transportation needs - non-medical: None  Occupational History  . Occupation: Retired   Tobacco Use  . Smoking status: Former Smoker    Packs/day: 2.00    Years: 20.00    Pack years: 40.00    Last attempt to quit: 05/20/2004    Years since quitting: 13.6  . Smokeless tobacco: Never Used  Substance and Sexual Activity  . Alcohol use: No  . Drug use: No  . Sexual activity: None  Other Topics Concern  . None  Social History Narrative   Lives alone.  Retired from [employer]   Family History  Problem Relation Age of Onset  . Breast cancer Mother   . Diabetes Maternal Grandfather   . Lung cancer Father   . Heart failure Sister 73       Died. Morbidly obese  . Colon cancer Neg Hx   . Colon polyps Neg Hx        Review of Systems: Pertinent positive and negative review of systems were noted in the above HPI section. All other review of systems were otherwise negative.   Physical Exam: General: Well developed, well nourished,  no acute distress Head: Normocephalic and atraumatic Eyes:  sclerae anicteric, EOMI Ears: Normal auditory acuity Mouth: No deformity or lesions Neck: Supple, no masses or thyromegaly Lungs: Clear throughout to auscultation Heart: Regular rate and rhythm; no murmurs, rubs or bruits Abdomen: Soft, non tender and non distended. No masses, hepatosplenomegaly or hernias noted. Normal Bowel sounds Rectal: not done Musculoskeletal: Symmetrical with no gross deformities  Skin: No lesions on visible extremities Pulses:  Normal pulses noted Extremities: No clubbing, cyanosis, edema or deformities noted Neurological: Alert oriented x 4, grossly nonfocal Cervical Nodes:  No significant cervical adenopathy Inguinal Nodes: No significant inguinal adenopathy Psychological:  Alert and cooperative. Normal mood and affect  Assessment and Recommendations:  1. IBS-D with crampy lower abdominal pain.  Change dicyclomine to 20 mg 30-60 minutes po before meals tid.  Continue Imodium twice daily as needed.  2. Dysphagia liquids and solids, new onset. Chronic GERD.  Continue pantoprazole 40 mg twice daily.  Closely follow all antireflux measures.  Schedule EGD with possible dilation and barium esophagram. Uses O2 intermittent so EGD will be scheduled at the hospital. The risks (including bleeding, perforation, infection, missed lesions, medication reactions and possible hospitalization or surgery if complications occur), benefits, and alternatives to endoscopy with possible biopsy and possible dilation were discussed with the patient and they consent to proceed.   3. Small cell lung cancer with a history of brain mets.     cc: Redmond School, [place], [place] [address]

## 2018-01-13 ENCOUNTER — Encounter: Payer: Medicare Other | Admitting: Gastroenterology

## 2018-01-22 ENCOUNTER — Ambulatory Visit (HOSPITAL_COMMUNITY)
Admission: RE | Admit: 2018-01-22 | Discharge: 2018-01-22 | Disposition: A | Discharge status: Home / Self Care | Payer: Medicare Other | Source: Ambulatory Visit | Attending: Gastroenterology | Admitting: Gastroenterology

## 2018-01-22 DIAGNOSIS — K449 Diaphragmatic hernia without obstruction or gangrene: Secondary | ICD-10-CM | POA: Insufficient documentation

## 2018-01-22 DIAGNOSIS — R1310 Dysphagia, unspecified: Secondary | ICD-10-CM | POA: Diagnosis present

## 2018-01-22 DIAGNOSIS — K219 Gastro-esophageal reflux disease without esophagitis: Secondary | ICD-10-CM | POA: Diagnosis present

## 2018-01-22 DIAGNOSIS — R933 Abnormal findings on diagnostic imaging of other parts of digestive tract: Secondary | ICD-10-CM | POA: Diagnosis not present

## 2018-01-28 ENCOUNTER — Telehealth: Payer: Self-pay | Admitting: Internal Medicine

## 2018-01-28 NOTE — Telephone Encounter (Signed)
R/s appt per 2/27 sch message - left message with appt date and time.

## 2018-01-29 DIAGNOSIS — J449 Chronic obstructive pulmonary disease, unspecified: Secondary | ICD-10-CM | POA: Diagnosis not present

## 2018-01-29 DIAGNOSIS — R0902 Hypoxemia: Secondary | ICD-10-CM | POA: Diagnosis not present

## 2018-01-30 ENCOUNTER — Encounter (HOSPITAL_COMMUNITY): Payer: Self-pay

## 2018-01-30 ENCOUNTER — Ambulatory Visit (HOSPITAL_COMMUNITY)
Admission: RE | Admit: 2018-01-30 | Discharge: 2018-01-30 | Disposition: A | Discharge status: Home / Self Care | Payer: Medicare Other | Source: Ambulatory Visit | Attending: Internal Medicine | Admitting: Internal Medicine

## 2018-01-30 ENCOUNTER — Inpatient Hospital Stay: Payer: Medicare Other | Attending: Internal Medicine

## 2018-01-30 DIAGNOSIS — J439 Emphysema, unspecified: Secondary | ICD-10-CM | POA: Insufficient documentation

## 2018-01-30 DIAGNOSIS — C3492 Malignant neoplasm of unspecified part of left bronchus or lung: Secondary | ICD-10-CM | POA: Diagnosis not present

## 2018-01-30 DIAGNOSIS — Z79899 Other long term (current) drug therapy: Secondary | ICD-10-CM | POA: Diagnosis not present

## 2018-01-30 DIAGNOSIS — J701 Chronic and other pulmonary manifestations due to radiation: Secondary | ICD-10-CM | POA: Diagnosis not present

## 2018-01-30 DIAGNOSIS — I313 Pericardial effusion (noninflammatory): Secondary | ICD-10-CM | POA: Insufficient documentation

## 2018-01-30 DIAGNOSIS — R112 Nausea with vomiting, unspecified: Secondary | ICD-10-CM | POA: Diagnosis not present

## 2018-01-30 DIAGNOSIS — C3432 Malignant neoplasm of lower lobe, left bronchus or lung: Secondary | ICD-10-CM | POA: Diagnosis present

## 2018-01-30 DIAGNOSIS — K7689 Other specified diseases of liver: Secondary | ICD-10-CM | POA: Insufficient documentation

## 2018-01-30 DIAGNOSIS — R160 Hepatomegaly, not elsewhere classified: Secondary | ICD-10-CM | POA: Diagnosis not present

## 2018-01-30 DIAGNOSIS — Z5111 Encounter for antineoplastic chemotherapy: Secondary | ICD-10-CM | POA: Diagnosis not present

## 2018-01-30 DIAGNOSIS — K449 Diaphragmatic hernia without obstruction or gangrene: Secondary | ICD-10-CM | POA: Diagnosis not present

## 2018-01-30 DIAGNOSIS — Z923 Personal history of irradiation: Secondary | ICD-10-CM | POA: Insufficient documentation

## 2018-01-30 DIAGNOSIS — Z9221 Personal history of antineoplastic chemotherapy: Secondary | ICD-10-CM | POA: Insufficient documentation

## 2018-01-30 DIAGNOSIS — Z5112 Encounter for antineoplastic immunotherapy: Secondary | ICD-10-CM | POA: Insufficient documentation

## 2018-01-30 DIAGNOSIS — I700 Atherosclerosis of aorta: Secondary | ICD-10-CM | POA: Insufficient documentation

## 2018-01-30 DIAGNOSIS — R51 Headache: Secondary | ICD-10-CM | POA: Insufficient documentation

## 2018-01-30 DIAGNOSIS — R5382 Chronic fatigue, unspecified: Secondary | ICD-10-CM | POA: Insufficient documentation

## 2018-01-30 DIAGNOSIS — Z5189 Encounter for other specified aftercare: Secondary | ICD-10-CM | POA: Diagnosis not present

## 2018-01-30 DIAGNOSIS — E039 Hypothyroidism, unspecified: Secondary | ICD-10-CM | POA: Insufficient documentation

## 2018-01-30 LAB — CMP (CANCER CENTER ONLY)
AST: 9 U/L (ref 5–34)
Albumin: 3.5 g/dL (ref 3.5–5.0)
Alkaline Phosphatase: 47 U/L (ref 40–150)
Anion gap: 11 (ref 3–11)
BILIRUBIN TOTAL: 0.4 mg/dL (ref 0.2–1.2)
BUN: 9 mg/dL (ref 7–26)
CO2: 27 mmol/L (ref 22–29)
CREATININE: 0.73 mg/dL (ref 0.60–1.10)
Calcium: 9.2 mg/dL (ref 8.4–10.4)
Chloride: 103 mmol/L (ref 98–109)
Glucose, Bld: 85 mg/dL (ref 70–140)
POTASSIUM: 3.6 mmol/L (ref 3.5–5.1)
Sodium: 141 mmol/L (ref 136–145)
TOTAL PROTEIN: 6.6 g/dL (ref 6.4–8.3)

## 2018-01-30 LAB — CBC WITH DIFFERENTIAL (CANCER CENTER ONLY)
BASOS ABS: 0 10*3/uL (ref 0.0–0.1)
BASOS PCT: 0 %
EOS ABS: 0.2 10*3/uL (ref 0.0–0.5)
EOS PCT: 3 %
HCT: 40.2 % (ref 34.8–46.6)
HEMOGLOBIN: 13.9 g/dL (ref 11.6–15.9)
Lymphocytes Relative: 23 %
Lymphs Abs: 1.4 10*3/uL (ref 0.9–3.3)
MCH: 32.5 pg (ref 25.1–34.0)
MCHC: 34.6 g/dL (ref 31.5–36.0)
MCV: 93.9 fL (ref 79.5–101.0)
Monocytes Absolute: 0.6 10*3/uL (ref 0.1–0.9)
Monocytes Relative: 10 %
NEUTROS PCT: 64 %
Neutro Abs: 3.9 10*3/uL (ref 1.5–6.5)
PLATELETS: 181 10*3/uL (ref 145–400)
RBC: 4.28 MIL/uL (ref 3.70–5.45)
RDW: 13.6 % (ref 11.2–14.5)
WBC: 6.1 10*3/uL (ref 3.9–10.3)

## 2018-01-30 MED ORDER — IOPAMIDOL (ISOVUE-300) INJECTION 61%
INTRAVENOUS | Status: AC
  Filled 2018-01-30: qty 100

## 2018-01-30 MED ORDER — IOPAMIDOL (ISOVUE-300) INJECTION 61%
75.0000 mL | Freq: Once | INTRAVENOUS | Status: AC | PRN
  Administered 2018-01-30: 75 mL via INTRAVENOUS

## 2018-02-02 ENCOUNTER — Other Ambulatory Visit: Payer: Medicare Other

## 2018-02-02 ENCOUNTER — Inpatient Hospital Stay (HOSPITAL_BASED_OUTPATIENT_CLINIC_OR_DEPARTMENT_OTHER): Payer: Medicare Other | Admitting: Internal Medicine

## 2018-02-02 ENCOUNTER — Encounter: Payer: Self-pay | Admitting: Internal Medicine

## 2018-02-02 ENCOUNTER — Encounter: Payer: Self-pay | Admitting: *Deleted

## 2018-02-02 VITALS — BP 146/68 | HR 63 | Temp 97.6°F | Resp 17 | Ht 64.0 in | Wt 168.1 lb

## 2018-02-02 DIAGNOSIS — C3432 Malignant neoplasm of lower lobe, left bronchus or lung: Secondary | ICD-10-CM

## 2018-02-02 DIAGNOSIS — Z923 Personal history of irradiation: Secondary | ICD-10-CM

## 2018-02-02 DIAGNOSIS — Z9221 Personal history of antineoplastic chemotherapy: Secondary | ICD-10-CM | POA: Diagnosis not present

## 2018-02-02 DIAGNOSIS — C3492 Malignant neoplasm of unspecified part of left bronchus or lung: Secondary | ICD-10-CM

## 2018-02-02 NOTE — Progress Notes (Signed)
Oncology Nurse Navigator Documentation  Oncology Nurse Navigator Flowsheets 02/02/2018  Navigator Location [facility]  Treatment Phase Follow-up  Barriers/Navigation Needs Education/spoke with patient at clinic today.  Recent scan showed possible disease progression.  I spoke with her about this. She needs scans and biopsy. She verbalized understanding of next steps. I will contact CSW and update her on Ms. Westervelt status.   Education Other  Interventions Education;Other  Education Method Verbal  Acuity Level 2  Time Spent with Patient 30

## 2018-02-02 NOTE — Progress Notes (Signed)
[facility] [phone]   [phone]  OFFICE PROGRESS NOTE  Redmond School, MD [address] [place] [address]  DIAGNOSIS: Limited stage (T1b, N2, M0) small cell lung cancer presented with left lower lobe/infrahilar mass and large mediastinal lymphadenopathy diagnosed in October 2017.  PRIOR THERAPY: 1) Systemic chemotherapy with cisplatin 60 MG/M2 on day 1 and etoposide 120 MG/M2 on days 1, 2 and 3 status post 1 cycle. This was discontinued secondary to intolerance. 2) Systemic chemotherapy with carboplatin for AUC of 4 on day 1 and etoposide 100 MG/M2 on days 1, 2 and 3 with Neulasta support on day 4 every 3 weeks. Status post 5 cycles. This was concurrent with radiation in [place], [place]. 3) prophylactic cranial irradiation.  CURRENT THERAPY: Observation.  INTERVAL HISTORY: Rebekah Henderson 62 y.o. female returns to the clinic today for follow-up visit.  The patient is feeling fine today with no specific complaints except for generalized fatigue and arthralgia.  She denied having any chest pain but continues to have cough with no hemoptysis.  She denied having any weight loss or night sweats.  She has no nausea, vomiting, diarrhea or constipation.  She has intermittent headache with no visual changes.  The patient has been in observation for more than a year.  She is here today for evaluation with repeat CT scan of the chest for restaging of her disease.  MEDICAL HISTORY: Past Medical History:  Diagnosis Date  . 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 Anxiety    takes Prozac daily  . Arthritis   . Benign fundic gland polyps of stomach   . Colon polyps   . COPD (chronic obstructive pulmonary disease) ([place])   . Dehydration 03/06/2017  . Diverticulitis   . Dyspnea    with exertion  . Encounter for antineoplastic chemotherapy 12/03/2016  . Fibromyalgia   . GERD (gastroesophageal reflux disease)    takes Pantoprazole  daily  . Hypertension    takes Metoprolol,Triamterene-HCTZ,and Amlodipine daily  . Hypothyroidism    takes Synthroid daily  . IBS (irritable bowel syndrome)   . lung ca dx'd 10/02/2016   skin, lung  . PONV (postoperative nausea and vomiting)    pt also states that she had some difficulty breathing after cervical fusion    ALLERGIES:  is allergic to penicillins; codeine; ranitidine hcl; keflex [cephalexin]; and lyrica [pregabalin].  MEDICATIONS:  Current Outpatient Medications  Medication Sig Dispense Refill  . albuterol (PROVENTIL HFA;VENTOLIN HFA) 108 (90 Base) MCG/ACT inhaler Inhale 2 puffs into the lungs every 4 (four) hours as needed for wheezing or shortness of breath.    Marland Kitchen amLODipine (NORVASC) 5 MG tablet Take 5 mg by mouth daily.      Marland Kitchen dicyclomine (BENTYL) 20 MG tablet Take 20 mg by mouth 3 (three) times daily as needed for spasms.    Marland Kitchen estazolam (PROSOM) 2 MG tablet Take 2 mg by mouth at bedtime.    Marland Kitchen estradiol (ESTRACE) 2 MG tablet Take 2 mg by mouth daily.      Marland Kitchen FLUoxetine (PROZAC) 40 MG capsule Take 40 mg by mouth daily.   2  . levothyroxine (SYNTHROID, LEVOTHROID) 25 MCG tablet Take 50 mcg by mouth daily before breakfast.     . metoprolol succinate (TOPROL-XL) 25 MG 24 hr tablet Take 25 mg by mouth daily.    . ondansetron (ZOFRAN-ODT) 4 MG disintegrating tablet DISSOLVE 1 TABLET BY MOUTH EVERY 8 HOURS AS NEEDED FOR NAUSEA  AND VOMITING. 20 tablet 0  . pantoprazole (PROTONIX) 40 MG tablet Take 40 mg by mouth 2 (two) times daily before a meal.   10  . senna-docusate (SENOKOT-S) 8.6-50 MG tablet Take 1 tablet by mouth daily as needed.    . Vitamins/Minerals TABS Take by mouth.    Marland Kitchen HYDROcodone-acetaminophen (NORCO/VICODIN) 5-325 MG tablet Take 1 tablet by mouth every 4 (four) hours as needed for moderate pain.   0  . loperamide (IMODIUM) 2 MG capsule Take 2 mg by mouth every 2 (two) hours as needed for diarrhea or loose stools.    . promethazine (PHENERGAN) 25 MG tablet Take  25 mg by mouth every 6 (six) hours as needed for nausea/vomiting.  10  . triamterene-hydrochlorothiazide (DYAZIDE) 37.5-25 MG capsule Take 1 capsule by mouth daily.     No current facility-administered medications for this visit.     SURGICAL HISTORY:  Past Surgical History:  Procedure Laterality Date  . BIOPSY N/A 05/25/2013   Procedure: BIOPSIES (Random Colon; Duodenal; Gastric);  Surgeon: Danie Binder, MD;  Location: [facility];  Service: Endoscopy;  Laterality: N/A;  . BLADDER SUSPENSION    . BREAST ENHANCEMENT SURGERY    . BREAST IMPLANT REMOVAL    . CERVICAL FUSION  AUG 2013  . CHOLECYSTECTOMY  1999  . COLONOSCOPY  2007 [place]   POLYPS  . COLONOSCOPY WITH PROPOFOL N/A 05/25/2013   Procedure: COLONOSCOPY WITH PROPOFOL(at cecum 0957) total withdrawal time=49min);  Surgeon: Danie Binder, MD;  Location: [facility];  Service: Endoscopy;  Laterality: N/A;  . ESOPHAGOGASTRODUODENOSCOPY (EGD) WITH PROPOFOL N/A 05/25/2013   Procedure: ESOPHAGOGASTRODUODENOSCOPY (EGD) WITH PROPOFOL;  Surgeon: Danie Binder, MD;  Location: [facility];  Service: Endoscopy;  Laterality: N/A;  . FOOT SURGERY    . POLYPECTOMY N/A 05/25/2013   Procedure: POLYPECTOMY (Rectal and Gastric);  Surgeon: Danie Binder, MD;  Location: [facility];  Service: Endoscopy;  Laterality: N/A;  . TONSILLECTOMY    . UPPER GASTROINTESTINAL ENDOSCOPY    . VIDEO BRONCHOSCOPY WITH ENDOBRONCHIAL ULTRASOUND  09/12/2016   Procedure: VIDEO BRONCHOSCOPY WITH ENDOBRONCHIAL ULTRASOUND AND BIOPSY;  Surgeon: Juanito Doom, MD;  Location: [facility];  Service: Cardiopulmonary;;    REVIEW OF SYSTEMS:  Constitutional: positive for fatigue Eyes: negative Ears, nose, mouth, throat, and face: negative Respiratory: positive for cough and dyspnea on exertion Cardiovascular: negative Gastrointestinal: negative Genitourinary:negative Integument/breast: negative Hematologic/lymphatic: negative Musculoskeletal:positive for arthralgias Neurological:  negative Behavioral/Psych: negative Endocrine: negative Allergic/Immunologic: negative   PHYSICAL EXAMINATION: General appearance: alert, cooperative, fatigued and no distress Head: Normocephalic, without obvious abnormality, atraumatic Neck: no adenopathy, no JVD, supple, symmetrical, trachea midline and thyroid not enlarged, symmetric, no tenderness/mass/nodules Lymph nodes: Cervical, supraclavicular, and axillary nodes normal. Resp: clear to auscultation bilaterally Back: symmetric, no curvature. ROM normal. No CVA tenderness. Cardio: regular rate and rhythm, S1, S2 normal, no murmur, click, rub or gallop GI: soft, non-tender; bowel sounds normal; no masses,  no organomegaly Extremities: extremities normal, atraumatic, no cyanosis or edema Neurologic: Alert and oriented X 3, normal strength and tone. Normal symmetric reflexes. Normal coordination and gait   ECOG PERFORMANCE STATUS: 1 - Symptomatic but completely ambulatory  Blood pressure (!) 146/68, pulse 63, temperature 97.6 F (36.4 C), temperature source Oral, resp. rate 17, height 5\' 4"  (1.626 m), weight 168 lb 1.6 oz (76.2 kg), SpO2 91 %.  LABORATORY DATA: Lab Results  Component Value Date   WBC 6.1 01/30/2018   HGB 13.8 12/08/2017   HCT 40.2 01/30/2018  MCV 93.9 01/30/2018   PLT 181 01/30/2018      Chemistry      Component Value Date/Time   NA 141 01/30/2018 0757   NA 141 09/08/2017 1009   K 3.6 01/30/2018 0757   K 3.5 09/08/2017 1009   CL 103 01/30/2018 0757   CO2 27 01/30/2018 0757   CO2 29 09/08/2017 1009   BUN 9 01/30/2018 0757   BUN 5.8 (L) 09/08/2017 1009   CREATININE 0.73 01/30/2018 0757   CREATININE 0.7 09/08/2017 1009      Component Value Date/Time   CALCIUM 9.2 01/30/2018 0757   CALCIUM 9.0 09/08/2017 1009   ALKPHOS 47 01/30/2018 0757   ALKPHOS 49 09/08/2017 1009   AST 9 01/30/2018 0757   AST 9 09/08/2017 1009   ALT <6 01/30/2018 0757   ALT <6 09/08/2017 1009   BILITOT 0.4 01/30/2018 0757    BILITOT 0.51 09/08/2017 1009       RADIOGRAPHIC STUDIES: Ct Chest W Contrast  Result Date: 01/30/2018 CLINICAL DATA:  Limited stage small cell left lung carcinoma diagnosed October 2017 status post chemotherapy and concurrent radiation therapy. Patient presents for restaging on observation. EXAM: CT CHEST WITH CONTRAST TECHNIQUE: Multidetector CT imaging of the chest was performed during intravenous contrast administration. CONTRAST:  52mL ISOVUE-300 IOPAMIDOL (ISOVUE-300) INJECTION 61% COMPARISON:  12/08/2017 chest CT. FINDINGS: Cardiovascular: Normal heart size. Stable trace pericardial effusion/thickening. Atherosclerotic nonaneurysmal thoracic aorta. Normal caliber pulmonary arteries. No central pulmonary emboli. Mediastinum/Nodes: No discrete thyroid nodules. Stable mild circumferential wall thickening in midthoracic esophagus. No axillary adenopathy. Infiltrative AP window 4.5 x 3.1 cm soft tissue mass (series 2/image 47), previously 3.8 x 3.0 cm using similar measurement technique, increased. No additional pathologically enlarged mediastinal nodes. No hilar adenopathy. Lungs/Pleura: No pneumothorax. No pleural effusion. Moderate centrilobular emphysema with diffuse bronchial wall thickening. New mild focal patchy consolidation and ground-glass attenuation in the dependent right lower lobe (series 7/image 94). Sharply marginated bandlike perihilar consolidation throughout the left lung with associated volume loss and distortion, unchanged, compatible with radiation fibrosis. Stable minimal tree-in-bud opacity in the anterior left lower lobe (series 7/image 83), probably postinflammatory. No new significant pulmonary nodules. Upper abdomen: Small hiatal hernia. Partial visualization of hypoenhancing 4.3 x 3.0 cm posterior left liver lobe mass (series 2/image 143), not definitely seen on prior CT. Simple 1.1 cm upper right renal cyst. Musculoskeletal: No aggressive appearing focal osseous lesions. Mild  thoracic spondylosis. Partially visualized surgical hardware from ACDF in the lower cervical spine. IMPRESSION: 1. Partial visualization of a hypoenhancing 4.3 cm posterior left liver lobe mass worrisome for new liver metastasis. Continued growth of infiltrative AP window soft tissue mass, most compatible with recurrent malignancy. Consider restaging with PET-CT. 2. New mild focal patchy consolidation and ground-glass opacity in the posterior right lower lobe, more likely inflammatory. Recommend attention on future surveillance chest CT. 3. 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 4. Chronic findings include: Stable trace pericardial effusion/thickening. Small hiatal hernia. Aortic Atherosclerosis (ICD10-I70.0) and Emphysema (ICD10-J43.9). Electronically Signed   By: Ilona Sorrel M.D.   On: 01/30/2018 11:27   Dg Esophagus  Result Date: 01/22/2018 CLINICAL DATA:  Dysphagia. EXAM: ESOPHOGRAM / BARIUM SWALLOW / BARIUM TABLET STUDY TECHNIQUE: Combined double contrast and single contrast examination performed using effervescent crystals, thick barium liquid, and thin barium liquid. The patient was observed with fluoroscopy swallowing a 13 mm barium sulphate tablet. FLUOROSCOPY TIME:  Fluoroscopy Time:  2 minutes, 42 seconds. Radiation Exposure Index (if provided by the fluoroscopic  device): 28.0 mGy. Number of Acquired Spot Images: 0 COMPARISON:  CT chest dated December 08, 2017. FINDINGS: The pharyngeal phase of swallowing demonstrates retention in the vallecula and piriform sinuses, with a prominent cricopharyngeal bar. Intermittent tertiary contractions of the mid to distal esophagus. No obstruction to the forward flow of contrast throughout the esophagus and into the stomach. Normal esophageal course and contour. Normal esophageal mucosal pattern. No esophageal stricture, ulceration, or other significant abnormality. Small hiatal hernia. No gastroesophageal reflux occurred spontaneously or was  elicited. Delayed passage of a 13 mm barium tablet into the stomach secondary to esophageal dysmotility. IMPRESSION: 1. Intermittent tertiary contractions of the mid to distal esophagus, suggestive of nonspecific dysmotility. 2. Prominent cricopharyngeal bar. 3. Small hiatal hernia. Electronically Signed   By: Titus Dubin M.D.   On: 01/22/2018 11:50    ASSESSMENT AND PLAN:  This is a very pleasant 62 years old white female was limited stage small cell lung cancer, status post 6 cycles of systemic chemotherapy with carboplatin and etoposide concurrent with radiation and followed by prophylactic cranial irradiation. Patient has been in observation for more than a year.  She has been doing fine but recently started complaining of increasing fatigue and weakness as well as headache. Repeat CT scan of the chest showed concerning findings for disease recurrence in the lung as well as new lesion in the liver. I personally and independently reviewed the scan images and discussed the results and showed the images to the patient today.  Unfortunately this is highly suspicious for recurrent small cell lung cancer but other malignancy cannot be excluded. I recommended for the patient to complete the staging workup by ordering a PET scan as well as MRI of the brain to rule out brain metastasis. I would also refer the patient to interventional radiology for consideration of ultrasound-guided core biopsy of the liver mass. I will see the patient back for follow-up visit in around 10 days for evaluation and more detailed discussion of her treatment options based on the biopsy results and imaging studies. She was advised to call immediately if she has any concerning symptoms in the interval. The patient voices understanding of current disease status and treatment options and is in agreement with the current care plan. All questions were answered. The patient knows to call the clinic with any problems, questions or  concerns. We can certainly see the patient much sooner if necessary. Disclaimer: This note was dictated with voice recognition software. Similar sounding words can inadvertently be transcribed and may not be corrected upon review.

## 2018-02-03 ENCOUNTER — Telehealth: Payer: Self-pay | Admitting: Internal Medicine

## 2018-02-03 ENCOUNTER — Encounter: Payer: Self-pay | Admitting: General Practice

## 2018-02-03 NOTE — Telephone Encounter (Signed)
Scheduled appt per 3/4 los - left message with appt date and time- Central radiology to contact patient with scan appts.

## 2018-02-03 NOTE — Progress Notes (Signed)
[place] CSW Progress Note  Referral received from nurse navigator, patient may lack social support and/or have needs in the community setting.  CSW called patient, no answer, left VM requesting call back.  Edwyna Shell, LCSW Clinical Social Worker Phone:  [phone]

## 2018-02-05 ENCOUNTER — Other Ambulatory Visit: Payer: Self-pay | Admitting: Internal Medicine

## 2018-02-05 ENCOUNTER — Other Ambulatory Visit: Payer: Self-pay | Admitting: Radiology

## 2018-02-05 DIAGNOSIS — R112 Nausea with vomiting, unspecified: Secondary | ICD-10-CM

## 2018-02-06 ENCOUNTER — Ambulatory Visit (HOSPITAL_COMMUNITY)
Admission: RE | Admit: 2018-02-06 | Discharge: 2018-02-06 | Disposition: A | Discharge status: Home / Self Care | Payer: Medicare Other | Source: Ambulatory Visit | Attending: Internal Medicine | Admitting: Internal Medicine

## 2018-02-06 ENCOUNTER — Ambulatory Visit (HOSPITAL_COMMUNITY)
Admission: RE | Admit: 2018-02-06 | Discharge: 2018-02-06 | Disposition: A | Discharge status: Home / Self Care | Payer: Medicare Other | Source: Ambulatory Visit | Attending: Interventional Radiology | Admitting: Interventional Radiology

## 2018-02-06 DIAGNOSIS — M1990 Unspecified osteoarthritis, unspecified site: Secondary | ICD-10-CM | POA: Insufficient documentation

## 2018-02-06 DIAGNOSIS — C228 Malignant neoplasm of liver, primary, unspecified as to type: Secondary | ICD-10-CM | POA: Diagnosis not present

## 2018-02-06 DIAGNOSIS — E039 Hypothyroidism, unspecified: Secondary | ICD-10-CM | POA: Diagnosis not present

## 2018-02-06 DIAGNOSIS — C3490 Malignant neoplasm of unspecified part of unspecified bronchus or lung: Secondary | ICD-10-CM | POA: Insufficient documentation

## 2018-02-06 DIAGNOSIS — Z79899 Other long term (current) drug therapy: Secondary | ICD-10-CM | POA: Diagnosis not present

## 2018-02-06 DIAGNOSIS — K7689 Other specified diseases of liver: Secondary | ICD-10-CM | POA: Diagnosis not present

## 2018-02-06 DIAGNOSIS — F419 Anxiety disorder, unspecified: Secondary | ICD-10-CM | POA: Diagnosis not present

## 2018-02-06 DIAGNOSIS — K219 Gastro-esophageal reflux disease without esophagitis: Secondary | ICD-10-CM | POA: Insufficient documentation

## 2018-02-06 DIAGNOSIS — J449 Chronic obstructive pulmonary disease, unspecified: Secondary | ICD-10-CM | POA: Diagnosis not present

## 2018-02-06 DIAGNOSIS — I10 Essential (primary) hypertension: Secondary | ICD-10-CM | POA: Diagnosis not present

## 2018-02-06 DIAGNOSIS — R160 Hepatomegaly, not elsewhere classified: Secondary | ICD-10-CM | POA: Diagnosis present

## 2018-02-06 DIAGNOSIS — M797 Fibromyalgia: Secondary | ICD-10-CM | POA: Insufficient documentation

## 2018-02-06 DIAGNOSIS — C220 Liver cell carcinoma: Secondary | ICD-10-CM | POA: Diagnosis not present

## 2018-02-06 DIAGNOSIS — C3492 Malignant neoplasm of unspecified part of left bronchus or lung: Secondary | ICD-10-CM

## 2018-02-06 DIAGNOSIS — Z79890 Hormone replacement therapy (postmenopausal): Secondary | ICD-10-CM | POA: Insufficient documentation

## 2018-02-06 DIAGNOSIS — K449 Diaphragmatic hernia without obstruction or gangrene: Secondary | ICD-10-CM | POA: Diagnosis not present

## 2018-02-06 LAB — CBC
HEMATOCRIT: 40 % (ref 36.0–46.0)
Hemoglobin: 13.8 g/dL (ref 12.0–15.0)
MCH: 32.2 pg (ref 26.0–34.0)
MCHC: 34.5 g/dL (ref 30.0–36.0)
MCV: 93.5 fL (ref 78.0–100.0)
Platelets: 206 10*3/uL (ref 150–400)
RBC: 4.28 MIL/uL (ref 3.87–5.11)
RDW: 13.4 % (ref 11.5–15.5)
WBC: 6.5 10*3/uL (ref 4.0–10.5)

## 2018-02-06 LAB — PROTIME-INR
INR: 0.96
Prothrombin Time: 12.7 s (ref 11.4–15.2)

## 2018-02-06 LAB — APTT: APTT: 29 s (ref 24–36)

## 2018-02-06 MED ORDER — HYDROCODONE-ACETAMINOPHEN 5-325 MG PO TABS: 1.0000 | ORAL_TABLET | ORAL | Status: DC | PRN

## 2018-02-06 MED ORDER — MIDAZOLAM HCL 2 MG/2ML IJ SOLN
INTRAMUSCULAR | Status: AC | PRN
  Administered 2018-02-06 (×2): 1 mg via INTRAVENOUS

## 2018-02-06 MED ORDER — KETOROLAC TROMETHAMINE 30 MG/ML IJ SOLN
INTRAMUSCULAR | Status: AC
  Filled 2018-02-06: qty 1

## 2018-02-06 MED ORDER — HYDROCODONE-ACETAMINOPHEN 7.5-325 MG PO TABS: 1.0000 | ORAL_TABLET | Freq: Four times a day (QID) | ORAL | Status: DC | PRN

## 2018-02-06 MED ORDER — KETOROLAC TROMETHAMINE 30 MG/ML IJ SOLN
30.0000 mg | Freq: Once | INTRAMUSCULAR | Status: AC
  Administered 2018-02-06: 30 mg via INTRAVENOUS
  Filled 2018-02-06: qty 1

## 2018-02-06 MED ORDER — MORPHINE SULFATE 2 MG/ML IJ SOLN
2.0000 mg | INTRAMUSCULAR | Status: DC | PRN
  Filled 2018-02-06: qty 1

## 2018-02-06 MED ORDER — FENTANYL CITRATE (PF) 100 MCG/2ML IJ SOLN
INTRAMUSCULAR | Status: AC | PRN
  Administered 2018-02-06: 50 ug via INTRAVENOUS

## 2018-02-06 MED ORDER — MORPHINE SULFATE 10 MG/ML IJ SOLN
INTRAMUSCULAR | Status: AC | PRN
  Administered 2018-02-06: 2 mg via INTRAVENOUS

## 2018-02-06 MED ORDER — FENTANYL CITRATE (PF) 100 MCG/2ML IJ SOLN
INTRAMUSCULAR | Status: AC | PRN
  Administered 2018-02-06 (×2): 50 ug via INTRAVENOUS

## 2018-02-06 MED ORDER — ONDANSETRON HCL 4 MG/2ML IJ SOLN
INTRAMUSCULAR | Status: AC | PRN
  Administered 2018-02-06: 4 mg via INTRAVENOUS

## 2018-02-06 MED ORDER — LIDOCAINE HCL (PF) 1 % IJ SOLN
INTRAMUSCULAR | Status: AC
  Filled 2018-02-06: qty 30

## 2018-02-06 MED ORDER — MIDAZOLAM HCL 2 MG/2ML IJ SOLN
INTRAMUSCULAR | Status: AC
  Filled 2018-02-06: qty 2

## 2018-02-06 MED ORDER — FENTANYL CITRATE (PF) 100 MCG/2ML IJ SOLN
INTRAMUSCULAR | Status: AC
  Filled 2018-02-06: qty 2

## 2018-02-06 MED ORDER — MORPHINE SULFATE (PF) 4 MG/ML IV SOLN
INTRAVENOUS | Status: AC
  Administered 2018-02-06: 2 mg
  Filled 2018-02-06: qty 1

## 2018-02-06 MED ORDER — SODIUM CHLORIDE 0.9 % IV SOLN: INTRAVENOUS | Status: DC

## 2018-02-06 MED ORDER — GELATIN ABSORBABLE 12-7 MM EX MISC
CUTANEOUS | Status: AC
  Filled 2018-02-06: qty 1

## 2018-02-06 MED ORDER — ONDANSETRON HCL 4 MG/2ML IJ SOLN
INTRAMUSCULAR | Status: AC
  Administered 2018-02-06: 15:00:00
  Filled 2018-02-06: qty 2

## 2018-02-06 NOTE — Sedation Documentation (Signed)
Dr. Mertha Baars and Gareth Eagle PA-C in to see patient.  CT scan WNL.  Pt still extremely restless with pain and nauses.  Tordal 30mg  per Dr. Vernard Gambles order.

## 2018-02-06 NOTE — Procedures (Signed)
  Procedure: US liver lesion core biopsy   EBL:   minimal Complications:  none immediate  See full dictation in [facility].  Dillard Cannon MD Main # [phone] Pager  [phone]

## 2018-02-06 NOTE — Sedation Documentation (Signed)
Patient is resting comfortably. 

## 2018-02-06 NOTE — Progress Notes (Signed)
  Patient's pain improved with Toradol.  Rx for Toradol 10 mg PO a 6 hours given to patient.  #10 No refills.  Jeremian Whitby S Keyasia Jolliff PA-C 02/06/2018 4:32 PM

## 2018-02-06 NOTE — Sedation Documentation (Signed)
Patient back to base line.  Pain is resolved and nausea is resolved.

## 2018-02-06 NOTE — Sedation Documentation (Signed)
Patient beginning to relax, and is no longer restless.  Heart rate 73 O2 sat 97% on 2l/Startex

## 2018-02-06 NOTE — Sedation Documentation (Signed)
Complaining of pain and nausea.  Dr. Vernard Gambles called

## 2018-02-06 NOTE — H&P (Signed)
Chief Complaint: Patient was seen in consultation today for liver lesion  Referring Physician(s): Mohamed,Mohamed  Supervising Physician: Arne Cleveland  Patient Status: [facility] - Out-pt  History of Present Illness: Rebekah Henderson is a 62 y.o. female with past medical history of anxiety, arthritis, COPD, HTN, and lung cancer who has been undergoing treatment.  She recently had re-staging imaging.   CT Chest 01/30/18 showed: Partial visualization of hypoenhancing 4.3 x 3.0 cm posterior left liver lobe mass.  IR consulted for liver lesion biopsy at the request of Dr. Julien Nordmann.  Case reviewed and approved by Dr. Anselm Pancoast.   The patient presents today in her usual state of health.  She has been NPO.  She does not take blood thinners.   Past Medical History:  Diagnosis Date  . 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 Anxiety    takes Prozac daily  . Arthritis   . Benign fundic gland polyps of stomach   . Colon polyps   . COPD (chronic obstructive pulmonary disease) ([place])   . Dehydration 03/06/2017  . Diverticulitis   . Dyspnea    with exertion  . Encounter for antineoplastic chemotherapy 12/03/2016  . Fibromyalgia   . GERD (gastroesophageal reflux disease)    takes Pantoprazole daily  . Hypertension    takes Metoprolol,Triamterene-HCTZ,and Amlodipine daily  . Hypothyroidism    takes Synthroid daily  . IBS (irritable bowel syndrome)   . lung ca dx'd 10/02/2016   skin, lung  . PONV (postoperative nausea and vomiting)    pt also states that she had some difficulty breathing after cervical fusion    Past Surgical History:  Procedure Laterality Date  . BIOPSY N/A 05/25/2013   Procedure: BIOPSIES (Random Colon; Duodenal; Gastric);  Surgeon: Danie Binder, MD;  Location: [facility] ORS;  Service: Endoscopy;  Laterality: N/A;  . BLADDER SUSPENSION    . BREAST ENHANCEMENT SURGERY    . BREAST IMPLANT REMOVAL    . CERVICAL FUSION  AUG 2013  . CHOLECYSTECTOMY  1999  .  COLONOSCOPY  [place]   POLYPS  . COLONOSCOPY WITH PROPOFOL N/A 05/25/2013   Procedure: COLONOSCOPY WITH PROPOFOL(at cecum 0957) total withdrawal time=33min);  Surgeon: Danie Binder, MD;  Location: [facility];  Service: Endoscopy;  Laterality: N/A;  . ESOPHAGOGASTRODUODENOSCOPY (EGD) WITH PROPOFOL N/A 05/25/2013   Procedure: ESOPHAGOGASTRODUODENOSCOPY (EGD) WITH PROPOFOL;  Surgeon: Danie Binder, MD;  Location: [facility];  Service: Endoscopy;  Laterality: N/A;  . FOOT SURGERY    . POLYPECTOMY N/A 05/25/2013   Procedure: POLYPECTOMY (Rectal and Gastric);  Surgeon: Danie Binder, MD;  Location: [facility];  Service: Endoscopy;  Laterality: N/A;  . TONSILLECTOMY    . UPPER GASTROINTESTINAL ENDOSCOPY    . VIDEO BRONCHOSCOPY WITH ENDOBRONCHIAL ULTRASOUND  09/12/2016   Procedure: VIDEO BRONCHOSCOPY WITH ENDOBRONCHIAL ULTRASOUND AND BIOPSY;  Surgeon: Juanito Doom, MD;  Location: [facility] OR;  Service: Cardiopulmonary;;    Allergies: Penicillins; Codeine; Ranitidine hcl; Keflex [cephalexin]; and Lyrica [pregabalin]  Medications: Prior to Admission medications   Medication Sig Start Date End Date Taking? Authorizing Provider  albuterol (PROVENTIL HFA;VENTOLIN HFA) 108 (90 Base) MCG/ACT inhaler Inhale 2 puffs into the lungs every 4 (four) hours as needed for wheezing or shortness of breath.   Yes [provider]  ALPRAZolam Duanne Moron) 0.5 MG tablet Take 0.5 mg by mouth 3 (three) times daily as needed for anxiety.   Yes [provider]  amLODipine (NORVASC) 5 MG tablet Take 5 mg by mouth daily.  Yes [provider]  dicyclomine (BENTYL) 20 MG tablet Take 20 mg by mouth 3 (three) times daily as needed for spasms.   Yes [provider]  estazolam (PROSOM) 2 MG tablet Take 2 mg by mouth at bedtime.   Yes [provider]  estradiol (ESTRACE) 2 MG tablet Take 2 mg by mouth daily.     Yes [provider]  FLUoxetine (PROZAC) 40 MG capsule Take 40 mg by mouth  daily.    Yes [provider]  HYDROcodone-acetaminophen (NORCO/VICODIN) 5-325 MG tablet Take 1 tablet by mouth every 4 (four) hours as needed for moderate pain.    Yes [provider]  levothyroxine (SYNTHROID, LEVOTHROID) 50 MCG tablet Take 50 mcg by mouth daily before breakfast.    Yes [provider]  loperamide (IMODIUM) 2 MG capsule Take 2 mg by mouth every 2 (two) hours as needed for diarrhea or loose stools.   Yes [provider]  metoprolol succinate (TOPROL-XL) 25 MG 24 hr tablet Take 25 mg by mouth daily.   Yes [provider]  pantoprazole (PROTONIX) 40 MG tablet Take 40 mg by mouth 2 (two) times daily before a meal.    Yes [provider]  promethazine (PHENERGAN) 25 MG tablet Take 25 mg by mouth every 6 (six) hours as needed for nausea/vomiting. 07/05/17  Yes [provider]  senna-docusate (SENOKOT-S) 8.6-50 MG tablet Take 1 tablet by mouth daily as needed. 07/17/12  Yes [provider]  triamterene-hydrochlorothiazide (DYAZIDE) 37.5-25 MG capsule Take 1 capsule by mouth daily.   Yes [provider]  ondansetron (ZOFRAN-ODT) 4 MG disintegrating tablet DISSOLVE 1 TABLET BY MOUTH EVERY 8 HOURS AS NEEDED FOR NAUSEA AND VOMITING. 02/05/18   Curt Bears, MD     Family History  Problem Relation Age of Onset  . Breast cancer Mother   . Diabetes Maternal Grandfather   . Lung cancer Father   . Heart failure Sister 36       Died. Morbidly obese  . Colon cancer Neg Hx   . Colon polyps Neg Hx     Social History   Socioeconomic History  . Marital status: Divorced    Spouse name: Not on file  . Number of children: 1  . Years of education: College   . Highest education level: Not on file  Social Needs  . Financial resource strain: Not on file  . Food insecurity - worry: Not on file  . Food insecurity - inability: Not on file  . Transportation needs - medical: Not on file  . Transportation needs -  non-medical: Not on file  Occupational History  . Occupation: Retired   Tobacco Use  . Smoking status: Former Smoker    Packs/day: 2.00    Years: 20.00    Pack years: 40.00    Last attempt to quit: 05/20/2004    Years since quitting: 13.7  . Smokeless tobacco: Never Used  Substance and Sexual Activity  . Alcohol use: No  . Drug use: No  . Sexual activity: Not on file  Other Topics Concern  . Not on file  Social History Narrative   Lives alone.  Retired from [employer]: A 12 point ROS discussed and pertinent positives are indicated in the HPI above.  All other systems are negative.  Review of Systems  Constitutional: Negative for fatigue and fever.  Respiratory: Negative for cough and shortness of breath.   Cardiovascular: Negative  for chest pain.  Gastrointestinal: Negative for abdominal pain.  Psychiatric/Behavioral: Negative for behavioral problems and confusion.    Vital Signs: BP 131/76   Pulse (!) 59   Temp 97.8 F (36.6 C) (Oral)   Resp 16   Ht 5\' 4"  (1.626 m)   Wt 168 lb (76.2 kg)   SpO2 92%   BMI 28.84 kg/m   Physical Exam  Constitutional: She is oriented to person, place, and time. She appears well-developed.  Cardiovascular: Normal rate, regular rhythm and normal heart sounds.  Pulmonary/Chest: Effort normal and breath sounds normal. No respiratory distress.  Abdominal: Soft.  Neurological: She is alert and oriented to person, place, and time.  Skin: Skin is warm and dry.  Psychiatric: She has a normal mood and affect. Her behavior is normal. Judgment and thought content normal.  Nursing note and vitals reviewed.    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 Patient presents for restaging on observation. EXAM: CT CHEST WITH CONTRAST TECHNIQUE: Multidetector CT imaging of the chest was performed during intravenous contrast administration. CONTRAST:  28mL ISOVUE-300 IOPAMIDOL (ISOVUE-300) INJECTION 61% COMPARISON:  12/08/2017 chest CT. FINDINGS: Cardiovascular: Normal heart size. Stable trace pericardial effusion/thickening. Atherosclerotic nonaneurysmal thoracic aorta. Normal caliber pulmonary arteries. No central pulmonary emboli. Mediastinum/Nodes: No discrete thyroid nodules. Stable mild circumferential wall thickening in midthoracic esophagus. No axillary adenopathy. Infiltrative AP window 4.5 x 3.1 cm soft tissue mass (series 2/image 47), previously 3.8 x 3.0 cm using similar measurement technique, increased. No additional pathologically enlarged mediastinal nodes. No hilar adenopathy. Lungs/Pleura: No pneumothorax. No pleural effusion. Moderate centrilobular emphysema with diffuse bronchial wall thickening. New mild focal patchy consolidation and ground-glass attenuation in the dependent right lower lobe (series 7/image 94). Sharply marginated bandlike perihilar consolidation throughout the left lung with associated volume loss and distortion, unchanged, compatible with radiation fibrosis. Stable minimal tree-in-bud opacity in the anterior left lower lobe (series 7/image 83), probably postinflammatory. No new significant pulmonary nodules. Upper abdomen: Small hiatal hernia. Partial visualization of hypoenhancing 4.3 x 3.0 cm posterior left liver lobe mass (series 2/image 143), not definitely seen on prior CT. Simple 1.1 cm upper right renal cyst. Musculoskeletal: No aggressive appearing focal osseous lesions. Mild thoracic spondylosis. Partially visualized surgical hardware from ACDF in the lower cervical spine. IMPRESSION: 1. Partial visualization of a hypoenhancing 4.3 cm posterior left liver lobe mass worrisome for new liver metastasis.  Continued growth of infiltrative AP window soft tissue mass, most compatible with recurrent malignancy. Consider restaging with PET-CT. 2. New mild focal patchy consolidation and ground-glass opacity in the posterior right lower lobe, more likely inflammatory. Recommend attention on future surveillance chest CT. 3. 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 4. Chronic findings include: Stable trace pericardial effusion/thickening. Small hiatal hernia. Aortic Atherosclerosis (ICD10-I70.0) and Emphysema (ICD10-J43.9). Electronically Signed   By: Ilona Sorrel M.D.   On: 01/30/2018 11:27   Dg Esophagus  Result Date: 01/22/2018 CLINICAL DATA:  Dysphagia. EXAM: ESOPHOGRAM / BARIUM SWALLOW / BARIUM TABLET STUDY TECHNIQUE: Combined double contrast and single contrast examination performed using effervescent crystals, thick barium liquid, and thin barium liquid. The patient was observed with fluoroscopy swallowing a 13 mm barium sulphate tablet. FLUOROSCOPY TIME:  Fluoroscopy Time:  2 minutes, 42 seconds. Radiation  Exposure Index (if provided by the fluoroscopic device): 28.0 mGy. Number of Acquired Spot Images: 0 COMPARISON:  CT chest dated December 08, 2017. FINDINGS: The pharyngeal phase of swallowing demonstrates retention in the vallecula and piriform sinuses, with a prominent cricopharyngeal bar. Intermittent tertiary contractions of the mid to distal esophagus. No obstruction to the forward flow of contrast throughout the esophagus and into the stomach. Normal esophageal course and contour. Normal esophageal mucosal pattern. No esophageal stricture, ulceration, or other significant abnormality. Small hiatal hernia. No gastroesophageal reflux occurred spontaneously or was elicited. Delayed passage of a 13 mm barium tablet into the stomach secondary to esophageal dysmotility. IMPRESSION: 1. Intermittent tertiary contractions of the mid to distal esophagus, suggestive of nonspecific dysmotility. 2. Prominent  cricopharyngeal bar. 3. Small hiatal hernia. Electronically Signed   By: Titus Dubin M.D.   On: 01/22/2018 11:50    Labs:  CBC: Recent Labs    07/14/17 1454 08/22/17 1231 12/08/17 1045 01/30/18 0757 02/06/18 1216  WBC 7.3 7.5 6.5 6.1 6.5  HGB 13.1 13.2 13.8  --  13.8  HCT 38.3 38.7 40.6 40.2 40.0  PLT 209 319 173 181 206    COAGS: No results for input(s): INR, APTT in the last 8760 hours.  BMP: Recent Labs    07/07/17 1120  08/22/17 1231 09/08/17 1009 12/08/17 1045 01/30/18 0757  NA 141   < > 139 141 139 141  K 3.5   < > 3.8 3.5 3.7 3.6  CL 103  --  104  --  106 103  CO2 27   < > 25 29 24 27   GLUCOSE 105*   < > 95 77 88 85  BUN 6   < > 8 5.8* 7 9  CALCIUM 9.6   < > 8.9 9.0 9.0 9.2  CREATININE 0.80   < > 0.67 0.7 0.70 0.73  GFRNONAA >60  --  >60  --  >60 >60  GFRAA >60  --  >60  --  >60 >60   < > = values in this interval not displayed.    LIVER FUNCTION TESTS: Recent Labs    08/22/17 1231 09/08/17 1009 12/08/17 1045 01/30/18 0757  BILITOT 0.7 0.51 0.6 0.4  AST 17 9 9 9   ALT 10* <6 7 <6  ALKPHOS 53 49 39* 47  PROT 7.0 6.7 6.7 6.6  ALBUMIN 3.5 3.2* 3.6 3.5    TUMOR MARKERS: No results for input(s): AFPTM, CEA, CA199, CHROMGRNA in the last 8760 hours.  Assessment and Plan: Patient with past medical history of lung cancer presents with complaint possible disease progression, new liver lesion on CT imaging.  IR consulted for liver lesion biopsy at the request of Dr. Julien Nordmann. Case reviewed by Dr. Anselm Pancoast who approves patient for procedure.  Patient presents today in their usual state of health.  She has been NPO and is not currently on blood thinners.   Risks and benefits discussed with the patient including, but not limited to bleeding, infection, damage to adjacent structures or low yield requiring additional tests.  All of the patient's questions were answered, patient is agreeable to proceed. Consent signed and in chart.  Thank you for this  interesting consult.  I greatly enjoyed meeting Rebekah Henderson and look forward to participating in their care.  A copy of this report was sent to the requesting provider on this date.  Electronically Signed: Docia Barrier, PA 02/06/2018, 12:43 PM   I spent a total of  30  Minutes   in face to face in clinical consultation, greater than 50% of which was counseling/coordinating care for liver lesion.

## 2018-02-06 NOTE — Sedation Documentation (Signed)
Dr. Vernard Gambles in to see patient, [place] recheck,  Fent given.  No bleeding at site or per [place]

## 2018-02-06 NOTE — Sedation Documentation (Signed)
Second dose of Morphine given patient still complaining of pain and pain that radiates to chest.  Order obtain and EKG performed.  EKG WNL.  O2 on at 4l/[place] sat 97%

## 2018-02-06 NOTE — Discharge Instructions (Addendum)
Liver Biopsy, Care After Refer to this sheet in the next few weeks. These instructions provide you with information on caring for yourself after your procedure. Your health care provider may also give you more specific instructions. Your treatment has been planned according to current medical practices, but problems sometimes occur. Call your health care provider if you have any problems or questions after your procedure. What can I expect after the procedure? After your procedure, it is typical to have the following:  A small amount of discomfort in the area where the biopsy was done and in the right shoulder or shoulder blade.  A small amount of bruising around the area where the biopsy was done and on the skin over the liver.  Sleepiness and fatigue for the rest of the day.  Follow these instructions at home:  Rest at home for 1-2 days or as directed by your health care provider.  Have a friend or family member stay with you for at least 24 hours.  Because of the medicines used during the procedure, you should not do the following things in the first 24 hours: ? Drive. ? Use machinery. ? Be responsible for the care of other people. ? Sign legal documents. ? Take a bath or shower.  There are many different ways to close and cover an incision, including stitches, skin glue, and adhesive strips. Follow your health care provider's instructions on: ? Incision care. ? Bandage (dressing) changes and removal. ? Incision closure removal.  Do not drink alcohol in the first week.  Do not lift more than 5 pounds or play contact sports for 2 weeks after this test.  Take medicines only as directed by your health care provider. Do not take medicine containing aspirin or non-steroidal anti-inflammatory medicines such as ibuprofen for 1 week after this test.  It is your responsibility to get your test results. Contact a health care provider if:  You have increased bleeding from an incision  that results in more than a small spot of blood.  You have redness, swelling, or increasing pain in any incisions.  You notice a discharge or a bad smell coming from any of your incisions.  You have a fever or chills. Get help right away if:  You develop swelling, bloating, or pain in your abdomen.  You become dizzy or faint.  You develop a rash.  You are nauseous or vomit.  You have difficulty breathing, feel short of breath, or feel faint.  You develop chest pain.  You have problems with your speech or vision.  You have trouble balancing or moving your arms or legs. This information is not intended to replace advice given to you by your health care provider. Make sure you discuss any questions you have with your health care provider. Document Released: 06/07/2005 Document Revised: 04/25/2016 Document Reviewed: 01/14/2014 Elsevier Interactive Patient Education  2018 [employer]. Liver Biopsy, Care After These instructions give you information on caring for yourself after your procedure. Your doctor may also give you more specific instructions. Call your doctor if you have any problems or questions after your procedure. Follow these instructions at home:  Rest at home for 1-2 days or as told by your doctor.  Have someone stay with you for at least 24 hours.  Do not do these things in the first 24 hours: ? Drive. ? Use machinery. ? Take care of other people. ? Sign legal documents. ? Take a bath or shower.  There are many different  ways to close and cover a cut (incision). For example, a cut can be closed with stitches, skin glue, or adhesive strips. Follow your doctor's instructions on: ? Taking care of your cut. ? Changing and removing your bandage (dressing). ? Removing whatever was used to close your cut.  Do not drink alcohol in the first week.  Do not lift more than 5 pounds or play contact sports for the first 2 weeks.  Take medicines only as told by your  doctor. For 1 week, do not take medicine that has aspirin in it or medicines like ibuprofen.  Get your test results. Contact a doctor if:  A cut bleeds and leaves more than just a small spot of blood.  A cut is red, puffs up (swells), or hurts more than before.  Fluid or something else comes from a cut.  A cut smells bad.  You have a fever or chills. Get help right away if:  You have swelling, bloating, or pain in your belly (abdomen).  You get dizzy or faint.  You have a rash.  You feel sick to your stomach (nauseous) or throw up (vomit).  You have trouble breathing, feel short of breath, or feel faint.  Your chest hurts.  You have problems talking or seeing.  You have trouble balancing or moving your arms or legs. This information is not intended to replace advice given to you by your health care provider. Make sure you discuss any questions you have with your health care provider. Document Released: 08/27/2008 Document Revised: 04/25/2016 Document Reviewed: 01/14/2014 Elsevier Interactive Patient Education  2018 [employer]. Moderate Conscious Sedation, Adult, Care After These instructions provide you with information about caring for yourself after your procedure. Your health care provider may also give you more specific instructions. Your treatment has been planned according to current medical practices, but problems sometimes occur. Call your health care provider if you have any problems or questions after your procedure. What can I expect after the procedure? After your procedure, it is common:  To feel sleepy for several hours.  To feel clumsy and have poor balance for several hours.  To have poor judgment for several hours.  To vomit if you eat too soon.  Follow these instructions at home: For at least 24 hours after the procedure:   Do not: ? Participate in activities where you could fall or become injured. ? Drive. ? Use heavy machinery. ? Drink  alcohol. ? Take sleeping pills or medicines that cause drowsiness. ? Make important decisions or sign legal documents. ? Take care of children on your own.  Rest. Eating and drinking  Follow the diet recommended by your health care provider.  If you vomit: ? Drink water, juice, or soup when you can drink without vomiting. ? Make sure you have little or no nausea before eating solid foods. General instructions  Have a responsible adult stay with you until you are awake and alert.  Take over-the-counter and prescription medicines only as told by your health care provider.  If you smoke, do not smoke without supervision.  Keep all follow-up visits as told by your health care provider. This is important. Contact a health care provider if:  You keep feeling nauseous or you keep vomiting.  You feel light-headed.  You develop a rash.  You have a fever. Get help right away if:  You have trouble breathing. This information is not intended to replace advice given to you by your health care provider.  Make sure you discuss any questions you have with your health care provider. Document Released: 09/08/2013 Document Revised: 04/22/2016 Document Reviewed: 03/09/2016 Elsevier Interactive Patient Education  [employer].

## 2018-02-09 ENCOUNTER — Ambulatory Visit (HOSPITAL_COMMUNITY)
Admission: RE | Admit: 2018-02-09 | Discharge: 2018-02-09 | Disposition: A | Discharge status: Home / Self Care | Payer: Medicare Other | Source: Ambulatory Visit | Attending: Internal Medicine | Admitting: Internal Medicine

## 2018-02-09 ENCOUNTER — Encounter (HOSPITAL_COMMUNITY)
Admission: RE | Admit: 2018-02-09 | Discharge: 2018-02-09 | Disposition: A | Discharge status: Home / Self Care | Payer: Medicare Other | Source: Ambulatory Visit | Attending: Internal Medicine | Admitting: Internal Medicine

## 2018-02-09 DIAGNOSIS — I510 Cardiac septal defect, acquired: Secondary | ICD-10-CM | POA: Insufficient documentation

## 2018-02-09 DIAGNOSIS — C3492 Malignant neoplasm of unspecified part of left bronchus or lung: Secondary | ICD-10-CM

## 2018-02-09 DIAGNOSIS — R591 Generalized enlarged lymph nodes: Secondary | ICD-10-CM | POA: Insufficient documentation

## 2018-02-09 DIAGNOSIS — C3490 Malignant neoplasm of unspecified part of unspecified bronchus or lung: Secondary | ICD-10-CM | POA: Diagnosis not present

## 2018-02-09 LAB — GLUCOSE, CAPILLARY: Glucose-Capillary: 85 mg/dL (ref 65–99)

## 2018-02-09 MED ORDER — GADOBENATE DIMEGLUMINE 529 MG/ML IV SOLN
15.0000 mL | Freq: Once | INTRAVENOUS | Status: AC | PRN
  Administered 2018-02-09: 15 mL via INTRAVENOUS

## 2018-02-09 MED ORDER — FLUDEOXYGLUCOSE F - 18 (FDG) INJECTION
9.4400 | Freq: Once | INTRAVENOUS | Status: AC | PRN
  Administered 2018-02-09: 9.44 via INTRAVENOUS

## 2018-02-11 ENCOUNTER — Encounter: Payer: Self-pay | Admitting: Internal Medicine

## 2018-02-11 ENCOUNTER — Encounter: Payer: Self-pay | Admitting: General Practice

## 2018-02-11 ENCOUNTER — Inpatient Hospital Stay (HOSPITAL_BASED_OUTPATIENT_CLINIC_OR_DEPARTMENT_OTHER): Payer: Medicare Other | Admitting: Internal Medicine

## 2018-02-11 VITALS — BP 128/67 | HR 64 | Temp 97.6°F | Resp 18 | Ht 64.0 in | Wt 165.9 lb

## 2018-02-11 DIAGNOSIS — Z5111 Encounter for antineoplastic chemotherapy: Secondary | ICD-10-CM

## 2018-02-11 DIAGNOSIS — C3412 Malignant neoplasm of upper lobe, left bronchus or lung: Secondary | ICD-10-CM

## 2018-02-11 DIAGNOSIS — R51 Headache: Secondary | ICD-10-CM

## 2018-02-11 DIAGNOSIS — K7689 Other specified diseases of liver: Secondary | ICD-10-CM | POA: Diagnosis not present

## 2018-02-11 DIAGNOSIS — R112 Nausea with vomiting, unspecified: Secondary | ICD-10-CM

## 2018-02-11 DIAGNOSIS — C3432 Malignant neoplasm of lower lobe, left bronchus or lung: Secondary | ICD-10-CM

## 2018-02-11 DIAGNOSIS — C3492 Malignant neoplasm of unspecified part of left bronchus or lung: Secondary | ICD-10-CM

## 2018-02-11 DIAGNOSIS — R5382 Chronic fatigue, unspecified: Secondary | ICD-10-CM

## 2018-02-11 DIAGNOSIS — Z7189 Other specified counseling: Secondary | ICD-10-CM

## 2018-02-11 MED ORDER — ALPRAZOLAM 0.25 MG PO TABS: 0.5000 mg | ORAL_TABLET | Freq: Every evening | ORAL | 0 refills | Status: DC | PRN

## 2018-02-11 MED ORDER — PROMETHAZINE HCL 25 MG PO TABS: 25.0000 mg | ORAL_TABLET | Freq: Four times a day (QID) | ORAL | 10 refills | Status: DC | PRN

## 2018-02-11 NOTE — Progress Notes (Signed)
[place] Telephone:([phone]) [phone]   Fax:([phone]) [phone]  OFFICE PROGRESS NOTE  Redmond School, MD [address] [place] [address]  DIAGNOSIS: Metastatic small cell lung cancer initially diagnosed as Limited stage (T1b, N2, M0) small cell lung cancer presented with left lower lobe/infrahilar mass and large mediastinal lymphadenopathy diagnosed in October 2017.  PRIOR THERAPY: 1) Systemic chemotherapy with cisplatin 60 MG/M2 on day 1 and etoposide 120 MG/M2 on days 1, 2 and 3 status post 1 cycle. This was discontinued secondary to intolerance. 2) Systemic chemotherapy with carboplatin for AUC of 4 on day 1 and etoposide 100 MG/M2 on days 1, 2 and 3 with Neulasta support on day 4 every 3 weeks. Status post 5 cycles. This was concurrent with radiation in [place], [place]. 3) prophylactic cranial irradiation.  CURRENT THERAPY: Systemic chemotherapy with carboplatin for AUC of 5 on day 1, etoposide 100 mg/M2 on days 1, 2 and 3 as well as a Tecentriq (Atezolizumab) 1200 mg IV every 3 weeks with Neulasta support.  First dose February 17, 2018.  INTERVAL HISTORY: Rebekah Henderson 62 y.o. female returns to the clinic today for follow-up visit accompanied by friend.  The patient is feeling fine today with no specific complaints except for anxiety and concern about her recent disease recurrence.  She denied having any chest pain, shortness of breath but continues to have mild cough with no hemoptysis.  She denied having any fever or chills.  She has no nausea, vomiting, diarrhea or constipation.  She continues to have mild pain in the lower back.  She had several studies performed recently including a PET scan as well as MRI of the brain in addition to ultrasound-guided core biopsy of 1 of the suspicious liver lesion.  The final pathology from the liver biopsy was consistent with recurrent small cell lung cancer.  The patient is here today for evaluation and discussion of her  treatment options based on the recent studies.   MEDICAL HISTORY: Past Medical History:  Diagnosis Date  . 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 Anxiety    takes Prozac daily  . Arthritis   . Benign fundic gland polyps of stomach   . Colon polyps   . COPD (chronic obstructive pulmonary disease) ([place])   . Dehydration 03/06/2017  . Diverticulitis   . Dyspnea    with exertion  . Encounter for antineoplastic chemotherapy 12/03/2016  . Fibromyalgia   . GERD (gastroesophageal reflux disease)    takes Pantoprazole daily  . Hypertension    takes Metoprolol,Triamterene-HCTZ,and Amlodipine daily  . Hypothyroidism    takes Synthroid daily  . IBS (irritable bowel syndrome)   . lung ca dx'd 10/02/2016   skin, lung  . PONV (postoperative nausea and vomiting)    pt also states that she had some difficulty breathing after cervical fusion    ALLERGIES:  is allergic to penicillins; codeine; fentanyl; ranitidine hcl; keflex [cephalexin]; and lyrica [pregabalin].  MEDICATIONS:  Current Outpatient Medications  Medication Sig Dispense Refill  . albuterol (PROVENTIL HFA;VENTOLIN HFA) 108 (90 Base) MCG/ACT inhaler Inhale 2 puffs into the lungs every 4 (four) hours as needed for wheezing or shortness of breath.    . ALPRAZolam (XANAX) 0.5 MG tablet Take 0.5 mg by mouth 3 (three) times daily as needed for anxiety.    Marland Kitchen amLODipine (NORVASC) 5 MG tablet Take 5 mg by mouth daily.      Marland Kitchen dicyclomine (BENTYL) 20 MG tablet Take 20 mg  by mouth 3 (three) times daily as needed for spasms.    Marland Kitchen estazolam (PROSOM) 2 MG tablet Take 2 mg by mouth at bedtime.    Marland Kitchen estradiol (ESTRACE) 2 MG tablet Take 2 mg by mouth daily.      Marland Kitchen FLUoxetine (PROZAC) 40 MG capsule Take 40 mg by mouth daily.   2  . HYDROcodone-acetaminophen (NORCO/VICODIN) 5-325 MG tablet Take 1 tablet by mouth every 4 (four) hours as needed for moderate pain.   0  . levothyroxine (SYNTHROID, LEVOTHROID) 50 MCG tablet Take 50 mcg by  mouth daily before breakfast.     . loperamide (IMODIUM) 2 MG capsule Take 2 mg by mouth every 2 (two) hours as needed for diarrhea or loose stools.    . metoprolol succinate (TOPROL-XL) 25 MG 24 hr tablet Take 25 mg by mouth daily.    . ondansetron (ZOFRAN-ODT) 4 MG disintegrating tablet DISSOLVE 1 TABLET BY MOUTH EVERY 8 HOURS AS NEEDED FOR NAUSEA AND VOMITING. 20 tablet 0  . pantoprazole (PROTONIX) 40 MG tablet Take 40 mg by mouth 2 (two) times daily before a meal.   10  . promethazine (PHENERGAN) 25 MG tablet Take 25 mg by mouth every 6 (six) hours as needed for nausea/vomiting.  10  . senna-docusate (SENOKOT-S) 8.6-50 MG tablet Take 1 tablet by mouth daily as needed.    . triamterene-hydrochlorothiazide (DYAZIDE) 37.5-25 MG capsule Take 1 capsule by mouth daily.     No current facility-administered medications for this visit.     SURGICAL HISTORY:  Past Surgical History:  Procedure Laterality Date  . BIOPSY N/A 05/25/2013   Procedure: BIOPSIES (Random Colon; Duodenal; Gastric);  Surgeon: Danie Binder, MD;  Location: [facility];  Service: Endoscopy;  Laterality: N/A;  . BLADDER SUSPENSION    . BREAST ENHANCEMENT SURGERY    . BREAST IMPLANT REMOVAL    . CERVICAL FUSION  AUG 2013  . CHOLECYSTECTOMY  1999  . COLONOSCOPY  2007 [place]   POLYPS  . COLONOSCOPY WITH PROPOFOL N/A 05/25/2013   Procedure: COLONOSCOPY WITH PROPOFOL(at cecum 0957) total withdrawal time=86min);  Surgeon: Danie Binder, MD;  Location: [facility];  Service: Endoscopy;  Laterality: N/A;  . ESOPHAGOGASTRODUODENOSCOPY (EGD) WITH PROPOFOL N/A 05/25/2013   Procedure: ESOPHAGOGASTRODUODENOSCOPY (EGD) WITH PROPOFOL;  Surgeon: Danie Binder, MD;  Location: [facility];  Service: Endoscopy;  Laterality: N/A;  . FOOT SURGERY    . POLYPECTOMY N/A 05/25/2013   Procedure: POLYPECTOMY (Rectal and Gastric);  Surgeon: Danie Binder, MD;  Location: [facility];  Service: Endoscopy;  Laterality: N/A;  . TONSILLECTOMY    . UPPER GASTROINTESTINAL  ENDOSCOPY    . VIDEO BRONCHOSCOPY WITH ENDOBRONCHIAL ULTRASOUND  09/12/2016   Procedure: VIDEO BRONCHOSCOPY WITH ENDOBRONCHIAL ULTRASOUND AND BIOPSY;  Surgeon: Juanito Doom, MD;  Location: [facility];  Service: Cardiopulmonary;;    REVIEW OF SYSTEMS:  Constitutional: positive for fatigue Eyes: negative Ears, nose, mouth, throat, and face: negative Respiratory: positive for cough Cardiovascular: negative Gastrointestinal: negative Genitourinary:negative Integument/breast: negative Hematologic/lymphatic: negative Musculoskeletal:positive for arthralgias and back pain Neurological: negative Behavioral/Psych: positive for anxiety Endocrine: negative Allergic/Immunologic: negative   PHYSICAL EXAMINATION: General appearance: alert, cooperative, fatigued and no distress Head: Normocephalic, without obvious abnormality, atraumatic Neck: no adenopathy, no JVD, supple, symmetrical, trachea midline and thyroid not enlarged, symmetric, no tenderness/mass/nodules Lymph nodes: Cervical, supraclavicular, and axillary nodes normal. Resp: clear to auscultation bilaterally Back: symmetric, no curvature. ROM normal. No CVA tenderness. Cardio: regular rate and rhythm, S1, S2 normal, no  murmur, click, rub or gallop GI: soft, non-tender; bowel sounds normal; no masses,  no organomegaly Extremities: extremities normal, atraumatic, no cyanosis or edema Neurologic: Alert and oriented X 3, normal strength and tone. Normal symmetric reflexes. Normal coordination and gait   ECOG PERFORMANCE STATUS: 1 - Symptomatic but completely ambulatory  Blood pressure 128/67, pulse 64, temperature 97.6 F (36.4 C), temperature source Oral, resp. rate 18, height 5\' 4"  (1.626 m), weight 165 lb 14.4 oz (75.3 kg), SpO2 95 %.  LABORATORY DATA: Lab Results  Component Value Date   WBC 6.5 02/06/2018   HGB 13.8 02/06/2018   HCT 40.0 02/06/2018   MCV 93.5 02/06/2018   PLT 206 02/06/2018      Chemistry      Component  Value Date/Time   NA 141 01/30/2018 0757   NA 141 09/08/2017 1009   K 3.6 01/30/2018 0757   K 3.5 09/08/2017 1009   CL 103 01/30/2018 0757   CO2 27 01/30/2018 0757   CO2 29 09/08/2017 1009   BUN 9 01/30/2018 0757   BUN 5.8 (L) 09/08/2017 1009   CREATININE 0.73 01/30/2018 0757   CREATININE 0.7 09/08/2017 1009      Component Value Date/Time   CALCIUM 9.2 01/30/2018 0757   CALCIUM 9.0 09/08/2017 1009   ALKPHOS 47 01/30/2018 0757   ALKPHOS 49 09/08/2017 1009   AST 9 01/30/2018 0757   AST 9 09/08/2017 1009   ALT <6 01/30/2018 0757   ALT <6 09/08/2017 1009   BILITOT 0.4 01/30/2018 0757   BILITOT 0.51 09/08/2017 1009       RADIOGRAPHIC STUDIES: Ct Abdomen Wo Contrast  Result Date: 02/06/2018 CLINICAL DATA:  62 year old female with severe abdominal pain and nausea after ultrasound-guided needle biopsy of the liver today. Underlying small cell lung carcinoma. EXAM: CT ABDOMEN WITHOUT CONTRAST TECHNIQUE: Multidetector CT imaging of the abdomen was performed following the standard protocol without IV contrast. COMPARISON:  CT chest 01/30/2018 and earlier. CT Abdomen and Pelvis 03/03/2017. FINDINGS: Lower chest: Left lower lobe perihilar opacity/atelectasis is stable. No pericardial effusion. No pleural effusion or pneumothorax at the lung bases. There is mild new right costophrenic angle atelectasis. Hepatobiliary: Subtle hypodense posterior left lobe liver mass is again evident when using narrow CT windows on series 3, image 29. Estimated size is 38 millimeters diameter, not significantly changed from the recent chest CT. There is no perihepatic fluid. There is no pneumoperitoneum. There is only minimal fat stranding in the ventral abdomen which probably represents subcutaneous anesthesia along the biopsy course (series 3, image 29). The underlying ventral abdominal wall musculature appears normal. Surgically absent gallbladder. No other liver lesion identified on this noncontrast study.  Pancreas: Negative. Spleen: Negative. Adrenals/Urinary Tract: Normal adrenal glands. Both noncontrast kidneys appear normal. No hydronephrosis or proximal hydroureter. Stomach/Bowel: Diverticulosis in the left colon and at the splenic flexure with no active inflammation. Otherwise negative transverse colon. Negative visible right colon. No dilated or abnormal small bowel identified. A moderate size gastric hiatal hernia has increased compared to 01/30/2018. The stomach otherwise appears within normal limits. No perigastric inflammatory stranding. The duodenum appears within normal limits. Vascular/Lymphatic: Vascular patency is not evaluated in the absence of IV contrast. Calcified aortic atherosclerosis. No lymphadenopathy identified in the upper abdomen. Other: No free fluid in the lower abdomen. Musculoskeletal: Stable visualized osseous structures. IMPRESSION: 1. No acute or adverse features identified in the abdomen or at the lung bases following ultrasound-guided percutaneous liver biopsy. Dr. Arne Cleveland was advised on 02/06/2018 at  15:55 . 2. A gastric hiatal hernia is mildly larger than on the 01/30/2018 chest CT. Electronically Signed   By: Genevie Ann M.D.   On: 02/06/2018 15:55   Ct Chest W Contrast  Result Date: 01/30/2018 CLINICAL DATA:  Limited stage small cell left lung carcinoma diagnosed October 2017 status post chemotherapy and concurrent radiation therapy. Patient presents for restaging on observation. EXAM: CT CHEST WITH CONTRAST TECHNIQUE: Multidetector CT imaging of the chest was performed during intravenous contrast administration. CONTRAST:  69mL ISOVUE-300 IOPAMIDOL (ISOVUE-300) INJECTION 61% COMPARISON:  12/08/2017 chest CT. FINDINGS: Cardiovascular: Normal heart size. Stable trace pericardial effusion/thickening. Atherosclerotic nonaneurysmal thoracic aorta. Normal caliber pulmonary arteries. No central pulmonary emboli. Mediastinum/Nodes: No discrete thyroid nodules. Stable mild  circumferential wall thickening in midthoracic esophagus. No axillary adenopathy. Infiltrative AP window 4.5 x 3.1 cm soft tissue mass (series 2/image 47), previously 3.8 x 3.0 cm using similar measurement technique, increased. No additional pathologically enlarged mediastinal nodes. No hilar adenopathy. Lungs/Pleura: No pneumothorax. No pleural effusion. Moderate centrilobular emphysema with diffuse bronchial wall thickening. New mild focal patchy consolidation and ground-glass attenuation in the dependent right lower lobe (series 7/image 94). Sharply marginated bandlike perihilar consolidation throughout the left lung with associated volume loss and distortion, unchanged, compatible with radiation fibrosis. Stable minimal tree-in-bud opacity in the anterior left lower lobe (series 7/image 83), probably postinflammatory. No new significant pulmonary nodules. Upper abdomen: Small hiatal hernia. Partial visualization of hypoenhancing 4.3 x 3.0 cm posterior left liver lobe mass (series 2/image 143), not definitely seen on prior CT. Simple 1.1 cm upper right renal cyst. Musculoskeletal: No aggressive appearing focal osseous lesions. Mild thoracic spondylosis. Partially visualized surgical hardware from ACDF in the lower cervical spine. IMPRESSION: 1. Partial visualization of a hypoenhancing 4.3 cm posterior left liver lobe mass worrisome for new liver metastasis. Continued growth of infiltrative AP window soft tissue mass, most compatible with recurrent malignancy. Consider restaging with PET-CT. 2. New mild focal patchy consolidation and ground-glass opacity in the posterior right lower lobe, more likely inflammatory. Recommend attention on future surveillance chest CT. 3. 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 4. Chronic findings include: Stable trace pericardial effusion/thickening. Small hiatal hernia. Aortic Atherosclerosis (ICD10-I70.0) and Emphysema (ICD10-J43.9). Electronically Signed   By: Ilona Sorrel M.D.   On: 01/30/2018 11:27   Mr Jeri Cos JO Contrast  Result Date: 02/09/2018 CLINICAL DATA:  Small-cell lung cancer. Prior prophylactic cranial irradiation. EXAM: MRI HEAD WITHOUT AND WITH CONTRAST TECHNIQUE: Multiplanar, multiecho pulse sequences of the brain and surrounding structures were obtained without and with intravenous contrast. CONTRAST:  89mL MULTIHANCE GADOBENATE DIMEGLUMINE 529 MG/ML IV SOLN COMPARISON:  09/19/2017 FINDINGS: Brain: There is no evidence of acute infarct, intracranial hemorrhage, mass, midline shift, or extra-axial fluid collection. Mild cerebral atrophy is unchanged. Patchy T2 hyperintensities in the periventricular/deep cerebral white matter and pons have progressed without mass effect. A chronic lacunar infarct involving the right corona radiata and caudate is unchanged. No abnormal brain parenchymal or meningeal enhancement is identified. Vascular: Major intracranial vascular flow voids are preserved. Skull and upper cervical spine: Unremarkable bone marrow signal. Sinuses/Orbits: Unremarkable orbits. Clear paranasal sinuses. Unchanged small left mastoid effusion. Other: None. IMPRESSION: 1. No evidence of intracranial metastases. 2. Progressive white matter T2 hyperintensity likely reflecting post treatment changes. Electronically Signed   By: Logan Bores M.D.   On: 02/09/2018 08:59   Dg Esophagus  Result Date: 01/22/2018 CLINICAL DATA:  Dysphagia. EXAM: ESOPHOGRAM / BARIUM SWALLOW / BARIUM TABLET STUDY TECHNIQUE:  Combined double contrast and single contrast examination performed using effervescent crystals, thick barium liquid, and thin barium liquid. The patient was observed with fluoroscopy swallowing a 13 mm barium sulphate tablet. FLUOROSCOPY TIME:  Fluoroscopy Time:  2 minutes, 42 seconds. Radiation Exposure Index (if provided by the fluoroscopic device): 28.0 mGy. Number of Acquired Spot Images: 0 COMPARISON:  CT chest dated December 08, 2017. FINDINGS: The  pharyngeal phase of swallowing demonstrates retention in the vallecula and piriform sinuses, with a prominent cricopharyngeal bar. Intermittent tertiary contractions of the mid to distal esophagus. No obstruction to the forward flow of contrast throughout the esophagus and into the stomach. Normal esophageal course and contour. Normal esophageal mucosal pattern. No esophageal stricture, ulceration, or other significant abnormality. Small hiatal hernia. No gastroesophageal reflux occurred spontaneously or was elicited. Delayed passage of a 13 mm barium tablet into the stomach secondary to esophageal dysmotility. IMPRESSION: 1. Intermittent tertiary contractions of the mid to distal esophagus, suggestive of nonspecific dysmotility. 2. Prominent cricopharyngeal bar. 3. Small hiatal hernia. Electronically Signed   By: Titus Dubin M.D.   On: 01/22/2018 11:50   Nm Pet Image Restag (ps) Skull Base To Thigh  Result Date: 02/09/2018 CLINICAL DATA:  Subsequent treatment strategy for small cell lung cancer. EXAM: NUCLEAR MEDICINE PET SKULL BASE TO THIGH TECHNIQUE: 9.44 mCi F-18 FDG was injected intravenously. Full-ring PET imaging was performed from the skull base to thigh after the radiotracer. CT data was obtained and used for attenuation correction and anatomic localization. Fasting blood glucose: 85 mg/dl Mediastinal blood pool activity: SUV max 2.04 COMPARISON:  PET-CT 09/27/2016 and recent chest CT 01/30/2018 and abdominal CT scan 02/06/2018. FINDINGS: NECK: No hypermetabolic lymph nodes in the neck. Incidental CT findings: none CHEST: Bulky aorticopulmonary window lymphadenopathy is hypermetabolic with SUV max of 38.1 consistent with recurrent disease. Stable radiation changes involving the left hilum and paramediastinal long. No hypermetabolism in this area. Subpleural airspace opacity in the right lower lobe is hypermetabolic with SUV max of 4.0 but this has the appearance of an infiltrate on the chest CT. I  do not see a discrete pulmonary nodule or mass. Incidental CT findings: Surgical changes involving the right breast and scattered macro calcifications and asymmetric breast parenchyma but no focal mass or hypermetabolism. ABDOMEN/PELVIS: 4 cm segment 3 liver lesion is hypermetabolic with SUV max of 01.7 and most consistent with metastatic disease. This has been recently biopsied. No other liver lesions are identified. Small hypermetabolic lesion is noted in the spleen. This is difficult to identified for certain on the CT scan but I believe on the liver windows it measures 11 mm on image number 100 and the SUV max is 10.0. No adrenal gland metastasis are identified. No abdominal/pelvic lymphadenopathy. Incidental CT findings: none SKELETON: There is a metastatic lesion involving the L3 vertebral body with SUV max of 17.3. No other obvious the bony metastatic disease. Incidental CT findings: none IMPRESSION: 1. Bulky aorticopulmonary window hypermetabolic lymphadenopathy consistent with local recurrence. 2. Hypermetabolic hepatic, splenic and bone lesions consistent with metastatic disease as detailed above. Electronically Signed   By: Marijo Sanes M.D.   On: 02/09/2018 11:32   US Biopsy (liver)  Result Date: 02/06/2018 CLINICAL DATA:  Small-cell lung carcinoma. New posterior left liver lobe lesion on recent CT. EXAM: ULTRASOUND-GUIDED CORE LIVER BIOPSY TECHNIQUE: An ultrasound guided liver biopsy was thoroughly discussed with the patient and questions were answered. The benefits, risks, alternatives, and complications were also discussed. The patient understands and wishes to proceed  with the procedure. A verbal as well as written consent was obtained. Survey ultrasound of the liver was performed, the lesion localized, and an appropriate skin entry site was determined. Skin site was marked, prepped with chlorhexidine, and draped in usual sterile fashion, and infiltrated locally with 1% lidocaine. Intravenous  Fentanyl and Versed were administered as conscious sedation during continuous monitoring of the patient's level of consciousness and physiological / cardiorespiratory status by the radiology RN, with a total moderate sedation time of 10 minutes. A 17 gauge trocar needle was advanced under ultrasound guidance into the liver to the margin of the lesion. Multiple coaxial 18gauge core samples were then obtained through the guide needle. The guide needle was removed. Post procedure scans demonstrate no apparent complication. COMPLICATIONS: COMPLICATIONS None immediate FINDINGS: Hypoechoic lesion in the left lobe was localized corresponding to the finding on prior scans. Representative core biopsy samples obtained. IMPRESSION: 1. Technically successful ultrasound guided core liver biopsy. 2. Patient developed severe nausea and regional epigastric pain shortly postprocedure. Ultrasound showed no hematoma or evidence of active hemorrhage. Due to persistence of symptoms despite pain and nausea medications, subsequent CT abdomen without contrast was performed, which also showed no evidence of hemorrhage or other apparent complication. Symptoms resolved with Toradol and may have represented crossover adverse reaction to the fentanyl as she describes similar episodes of severe nausea after codeine. Electronically Signed   By: Lucrezia Europe M.D.   On: 02/06/2018 16:35    ASSESSMENT AND PLAN:  This is a very pleasant 62 years old white female was limited stage small cell lung cancer, status post 6 cycles of systemic chemotherapy with carboplatin and etoposide concurrent with radiation and followed by prophylactic cranial irradiation. Patient has been in observation for more than a year.  She has been doing fine but recently started complaining of increasing fatigue and weakness as well as headache. Repeat CT scan of the chest showed concerning findings for disease recurrence in the lung as well as new lesion in the liver. I  ordered a PET scan which was performed recently and unfortunately showed hypermetabolic activity in the mediastinal lymph nodes in addition to the metastatic disease to the liver and bone including L3 bone lesion. Ultrasound-guided core biopsy of one of the suspicious liver lesion was consistent with recurrent small cell carcinoma. Had a lengthy discussion with the patient and her friend today about her current condition and treatment options.  I explained to the patient that she has an incurable condition and all the treatment will be of palliative nature. I gave her the option of palliative care and hospice referral versus consideration of palliative systemic chemotherapy with carboplatin for AUC of 5 on day 1, etoposide 100 mg/M2 on days 1, 2 and 3 in addition to Tecentriq (Atezolizumab) 1200 mg IV every 3 weeks with Neulasta support.  The patient is interested in systemic chemotherapy.  I discussed with her the adverse effect of this treatment including but not limited to alopecia, myelosuppression, nausea and vomiting, peripheral neuropathy, liver or renal dysfunction as well as the immunotherapy mediated toxicity including but not limited to skin rash, diarrhea, inflammation of the lung, kidney, liver, thyroid or other endocrine dysfunction. She is expected to start the first dose of this treatment on February 17, 2018. I will see the patient back for follow-up visit in 4 weeks for evaluation before starting cycle #2. For anxiety, I gave her a refill of Xanax. For nausea, I would send prescription for Phenergan to her pharmacy.  She also has Zofran ODT available at home. The patient was advised to call immediately if she has any concerning symptoms in the interval.   The patient voices understanding of current disease status and treatment options and is in agreement with the current care plan. All questions were answered. The patient knows to call the clinic with any problems, questions or concerns. We  can certainly see the patient much sooner if necessary. Disclaimer: This note was dictated with voice recognition software. Similar sounding words can inadvertently be transcribed and may not be corrected upon review.

## 2018-02-11 NOTE — Progress Notes (Signed)
[place] CSW Progress Notes  Visit w patient in exam room in clinic.  Patient referred by nurse navigator due to social isolation and anxiety.  CSW spoke w patient and accompanying friend, Arbie Cookey.  Per patient, she lives alone and has done so for past 30 years since divorce.  Has adult son who has limited time to spend w patient due to marriage/parenting.  Friends and church community are very supportive of patient, "they have never turned me down for anything I need/ask for."  Per patient, she struggles w anxiety.  Per friend, patient has historically been anxious.  Copes w anxiety by distraction ("I go out in the yard or find something to do"). "If that doesn't work, I call someone."  Reports using prescribed Xanax for relief of severe anxiety.  Reports being quite anxious about recent tests to determine if cancer has spread and next treatment plan.  CSW reviewed information on predictable times of heightened anxiety in cancer treatment, including possible worsening of disease/change in treatment plan.  Normalized feelings of anxiety.  Discussed and demonstrated use of breathing and grounding techniques to assist patient in deescalation of anxiety. Also discussed need for support - gave information about cancer support group at [facility], friend encouraged attendance.  Also provided Support Center and [employer] calendars. Patient verbalized understanding, was encouraged to contact CSW as needed.  Edwyna Shell, LCSW Clinical Social Worker Phone:  [phone]

## 2018-02-11 NOTE — Progress Notes (Signed)
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     A cycle is every 21 days:     Etoposide        Dose Mod: None     Carboplatin        Dose Mod: None  **Always confirm dose/schedule in your pharmacy ordering system**    REASON: Other Reason PRIOR TREATMENT: [id number]: Etoposide 100 mg/m2 Days 1, 2, 3 + Carboplatin AUC=5 Day 1 q21 Days x 4 Cycles with Concurrent Radiation** TREATMENT RESPONSE: Partial Response (PR)  START ON PATHWAY REGIMEN - Small Cell Lung     Cycles 1 through 4, every 21 days:     Atezolizumab      Carboplatin      Etoposide    Cycles 5 and beyond, every 21 days:     Atezolizumab   **Always confirm dose/schedule in your pharmacy ordering system**    Patient Characteristics: Extensive Stage, 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

## 2018-02-11 NOTE — Progress Notes (Signed)
[place] [phone]   [phone]  OFFICE PROGRESS NOTE  Redmond School, MD [address] [place] [address]  DIAGNOSIS: Metastatic small cell lung cancer initially diagnosed as Limited stage (T1b, N2, M0) small cell lung cancer presented with left lower lobe/infrahilar mass and large mediastinal lymphadenopathy diagnosed in October 2017.  PRIOR THERAPY: 1) Systemic chemotherapy with cisplatin 60 MG/M2 on day 1 and etoposide 120 MG/M2 on days 1, 2 and 3 status post 1 cycle. This was discontinued secondary to intolerance. 2) Systemic chemotherapy with carboplatin for AUC of 4 on day 1 and etoposide 100 MG/M2 on days 1, 2 and 3 with Neulasta support on day 4 every 3 weeks. Status post 5 cycles. This was concurrent with radiation in [place], [place]. 3) prophylactic cranial irradiation.  CURRENT THERAPY: Observation.  INTERVAL HISTORY: Rebekah Henderson 62 y.o. female    MEDICAL HISTORY: Past Medical History:  Diagnosis Date  . 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 Anxiety    takes Prozac daily  . Arthritis   . Benign fundic gland polyps of stomach   . Colon polyps   . COPD (chronic obstructive pulmonary disease) ([place])   . Dehydration 03/06/2017  . Diverticulitis   . Dyspnea    with exertion  . Encounter for antineoplastic chemotherapy 12/03/2016  . Fibromyalgia   . GERD (gastroesophageal reflux disease)    takes Pantoprazole daily  . Hypertension    takes Metoprolol,Triamterene-HCTZ,and Amlodipine daily  . Hypothyroidism    takes Synthroid daily  . IBS (irritable bowel syndrome)   . lung ca dx'd 10/02/2016   skin, lung  . PONV (postoperative nausea and vomiting)    pt also states that she had some difficulty breathing after cervical fusion    ALLERGIES:  is allergic to penicillins; codeine; fentanyl; ranitidine hcl; keflex [cephalexin]; and lyrica [pregabalin].  MEDICATIONS:  Current Outpatient  Medications  Medication Sig Dispense Refill  . albuterol (PROVENTIL HFA;VENTOLIN HFA) 108 (90 Base) MCG/ACT inhaler Inhale 2 puffs into the lungs every 4 (four) hours as needed for wheezing or shortness of breath.    . ALPRAZolam (XANAX) 0.5 MG tablet Take 0.5 mg by mouth 3 (three) times daily as needed for anxiety.    Marland Kitchen amLODipine (NORVASC) 5 MG tablet Take 5 mg by mouth daily.      Marland Kitchen dicyclomine (BENTYL) 20 MG tablet Take 20 mg by mouth 3 (three) times daily as needed for spasms.    Marland Kitchen estazolam (PROSOM) 2 MG tablet Take 2 mg by mouth at bedtime.    Marland Kitchen estradiol (ESTRACE) 2 MG tablet Take 2 mg by mouth daily.      Marland Kitchen FLUoxetine (PROZAC) 40 MG capsule Take 40 mg by mouth daily.   2  . HYDROcodone-acetaminophen (NORCO/VICODIN) 5-325 MG tablet Take 1 tablet by mouth every 4 (four) hours as needed for moderate pain.   0  . levothyroxine (SYNTHROID, LEVOTHROID) 50 MCG tablet Take 50 mcg by mouth daily before breakfast.     . loperamide (IMODIUM) 2 MG capsule Take 2 mg by mouth every 2 (two) hours as needed for diarrhea or loose stools.    . metoprolol succinate (TOPROL-XL) 25 MG 24 hr tablet Take 25 mg by mouth daily.    . ondansetron (ZOFRAN-ODT) 4 MG disintegrating tablet DISSOLVE 1 TABLET BY MOUTH EVERY 8 HOURS AS NEEDED FOR NAUSEA AND VOMITING. 20 tablet 0  . pantoprazole (PROTONIX) 40 MG tablet Take 40 mg by mouth  2 (two) times daily before a meal.   10  . promethazine (PHENERGAN) 25 MG tablet Take 25 mg by mouth every 6 (six) hours as needed for nausea/vomiting.  10  . senna-docusate (SENOKOT-S) 8.6-50 MG tablet Take 1 tablet by mouth daily as needed.    . triamterene-hydrochlorothiazide (DYAZIDE) 37.5-25 MG capsule Take 1 capsule by mouth daily.     No current facility-administered medications for this visit.     SURGICAL HISTORY:  Past Surgical History:  Procedure Laterality Date  . BIOPSY N/A 05/25/2013   Procedure: BIOPSIES (Random Colon; Duodenal; Gastric);  Surgeon: Danie Binder, MD;   Location: [facility];  Service: Endoscopy;  Laterality: N/A;  . BLADDER SUSPENSION    . BREAST ENHANCEMENT SURGERY    . BREAST IMPLANT REMOVAL    . CERVICAL FUSION  AUG 2013  . CHOLECYSTECTOMY  1999  . COLONOSCOPY  2007 [place]   POLYPS  . COLONOSCOPY WITH PROPOFOL N/A 05/25/2013   Procedure: COLONOSCOPY WITH PROPOFOL(at cecum 0957) total withdrawal time=107min);  Surgeon: Danie Binder, MD;  Location: [facility];  Service: Endoscopy;  Laterality: N/A;  . ESOPHAGOGASTRODUODENOSCOPY (EGD) WITH PROPOFOL N/A 05/25/2013   Procedure: ESOPHAGOGASTRODUODENOSCOPY (EGD) WITH PROPOFOL;  Surgeon: Danie Binder, MD;  Location: [facility];  Service: Endoscopy;  Laterality: N/A;  . FOOT SURGERY    . POLYPECTOMY N/A 05/25/2013   Procedure: POLYPECTOMY (Rectal and Gastric);  Surgeon: Danie Binder, MD;  Location: [facility];  Service: Endoscopy;  Laterality: N/A;  . TONSILLECTOMY    . UPPER GASTROINTESTINAL ENDOSCOPY    . VIDEO BRONCHOSCOPY WITH ENDOBRONCHIAL ULTRASOUND  09/12/2016   Procedure: VIDEO BRONCHOSCOPY WITH ENDOBRONCHIAL ULTRASOUND AND BIOPSY;  Surgeon: Juanito Doom, MD;  Location: [facility];  Service: Cardiopulmonary;;    REVIEW OF SYSTEMS:  Constitutional: positive for fatigue Eyes: negative Ears, nose, mouth, throat, and face: negative Respiratory: positive for cough and dyspnea on exertion Cardiovascular: negative Gastrointestinal: negative Genitourinary:negative Integument/breast: negative Hematologic/lymphatic: negative Musculoskeletal:positive for arthralgias Neurological: negative Behavioral/Psych: negative Endocrine: negative Allergic/Immunologic: negative   PHYSICAL EXAMINATION: General appearance: alert, cooperative, fatigued and no distress Head: Normocephalic, without obvious abnormality, atraumatic Neck: no adenopathy, no JVD, supple, symmetrical, trachea midline and thyroid not enlarged, symmetric, no tenderness/mass/nodules Lymph nodes: Cervical, supraclavicular, and axillary nodes  normal. Resp: clear to auscultation bilaterally Back: symmetric, no curvature. ROM normal. No CVA tenderness. Cardio: regular rate and rhythm, S1, S2 normal, no murmur, click, rub or gallop GI: soft, non-tender; bowel sounds normal; no masses,  no organomegaly Extremities: extremities normal, atraumatic, no cyanosis or edema Neurologic: Alert and oriented X 3, normal strength and tone. Normal symmetric reflexes. Normal coordination and gait   ECOG PERFORMANCE STATUS: 1 - Symptomatic but completely ambulatory  Blood pressure 128/67, pulse 64, temperature 97.6 F (36.4 C), temperature source Oral, resp. rate 18, height 5\' 4"  (1.626 m), weight 165 lb 14.4 oz (75.3 kg), SpO2 95 %.  LABORATORY DATA: Lab Results  Component Value Date   WBC 6.5 02/06/2018   HGB 13.8 02/06/2018   HCT 40.0 02/06/2018   MCV 93.5 02/06/2018   PLT 206 02/06/2018      Chemistry      Component Value Date/Time   NA 141 01/30/2018 0757   NA 141 09/08/2017 1009   K 3.6 01/30/2018 0757   K 3.5 09/08/2017 1009   CL 103 01/30/2018 0757   CO2 27 01/30/2018 0757   CO2 29 09/08/2017 1009   BUN 9 01/30/2018 0757   BUN 5.8 (L)  09/08/2017 1009   CREATININE 0.73 01/30/2018 0757   CREATININE 0.7 09/08/2017 1009      Component Value Date/Time   CALCIUM 9.2 01/30/2018 0757   CALCIUM 9.0 09/08/2017 1009   ALKPHOS 47 01/30/2018 0757   ALKPHOS 49 09/08/2017 1009   AST 9 01/30/2018 0757   AST 9 09/08/2017 1009   ALT <6 01/30/2018 0757   ALT <6 09/08/2017 1009   BILITOT 0.4 01/30/2018 0757   BILITOT 0.51 09/08/2017 1009       RADIOGRAPHIC STUDIES: Ct Abdomen Wo Contrast  Result Date: 02/06/2018 CLINICAL DATA:  61 year old female with severe abdominal pain and nausea after ultrasound-guided needle biopsy of the liver today. Underlying small cell lung carcinoma. EXAM: CT ABDOMEN WITHOUT CONTRAST TECHNIQUE: Multidetector CT imaging of the abdomen was performed following the standard protocol without IV contrast.  COMPARISON:  CT chest 01/30/2018 and earlier. CT Abdomen and Pelvis 03/03/2017. FINDINGS: Lower chest: Left lower lobe perihilar opacity/atelectasis is stable. No pericardial effusion. No pleural effusion or pneumothorax at the lung bases. There is mild new right costophrenic angle atelectasis. Hepatobiliary: Subtle hypodense posterior left lobe liver mass is again evident when using narrow CT windows on series 3, image 29. Estimated size is 38 millimeters diameter, not significantly changed from the recent chest CT. There is no perihepatic fluid. There is no pneumoperitoneum. There is only minimal fat stranding in the ventral abdomen which probably represents subcutaneous anesthesia along the biopsy course (series 3, image 29). The underlying ventral abdominal wall musculature appears normal. Surgically absent gallbladder. No other liver lesion identified on this noncontrast study. Pancreas: Negative. Spleen: Negative. Adrenals/Urinary Tract: Normal adrenal glands. Both noncontrast kidneys appear normal. No hydronephrosis or proximal hydroureter. Stomach/Bowel: Diverticulosis in the left colon and at the splenic flexure with no active inflammation. Otherwise negative transverse colon. Negative visible right colon. No dilated or abnormal small bowel identified. A moderate size gastric hiatal hernia has increased compared to 01/30/2018. The stomach otherwise appears within normal limits. No perigastric inflammatory stranding. The duodenum appears within normal limits. Vascular/Lymphatic: Vascular patency is not evaluated in the absence of IV contrast. Calcified aortic atherosclerosis. No lymphadenopathy identified in the upper abdomen. Other: No free fluid in the lower abdomen. Musculoskeletal: Stable visualized osseous structures. IMPRESSION: 1. No acute or adverse features identified in the abdomen or at the lung bases following ultrasound-guided percutaneous liver biopsy. Dr. Arne Cleveland was advised on 02/06/2018  at 15:55 . 2. A gastric hiatal hernia is mildly larger than on the 01/30/2018 chest CT. Electronically Signed   By: Genevie Ann M.D.   On: 02/06/2018 15:55   Ct Chest W Contrast  Result Date: 01/30/2018 CLINICAL DATA:  Limited stage small cell left lung carcinoma diagnosed October 2017 status post chemotherapy and concurrent radiation therapy. Patient presents for restaging on observation. EXAM: CT CHEST WITH CONTRAST TECHNIQUE: Multidetector CT imaging of the chest was performed during intravenous contrast administration. CONTRAST:  65mL ISOVUE-300 IOPAMIDOL (ISOVUE-300) INJECTION 61% COMPARISON:  12/08/2017 chest CT. FINDINGS: Cardiovascular: Normal heart size. Stable trace pericardial effusion/thickening. Atherosclerotic nonaneurysmal thoracic aorta. Normal caliber pulmonary arteries. No central pulmonary emboli. Mediastinum/Nodes: No discrete thyroid nodules. Stable mild circumferential wall thickening in midthoracic esophagus. No axillary adenopathy. Infiltrative AP window 4.5 x 3.1 cm soft tissue mass (series 2/image 47), previously 3.8 x 3.0 cm using similar measurement technique, increased. No additional pathologically enlarged mediastinal nodes. No hilar adenopathy. Lungs/Pleura: No pneumothorax. No pleural effusion. Moderate centrilobular emphysema with diffuse bronchial wall thickening. New mild focal patchy consolidation  and ground-glass attenuation in the dependent right lower lobe (series 7/image 94). Sharply marginated bandlike perihilar consolidation throughout the left lung with associated volume loss and distortion, unchanged, compatible with radiation fibrosis. Stable minimal tree-in-bud opacity in the anterior left lower lobe (series 7/image 83), probably postinflammatory. No new significant pulmonary nodules. Upper abdomen: Small hiatal hernia. Partial visualization of hypoenhancing 4.3 x 3.0 cm posterior left liver lobe mass (series 2/image 143), not definitely seen on prior CT. Simple 1.1 cm  upper right renal cyst. Musculoskeletal: No aggressive appearing focal osseous lesions. Mild thoracic spondylosis. Partially visualized surgical hardware from ACD[facility] in the lower cervical spine. IMPRESSION: 1. Partial visualization of a hypoenhancing 4.3 cm posterior left liver lobe mass worrisome for new liver metastasis. Continued growth of infiltrative AP window soft tissue mass, most compatible with recurrent malignancy. Consider restaging with PET-CT. 2. New mild focal patchy consolidation and ground-glass opacity in the posterior right lower lobe, more likely inflammatory. Recommend attention on future surveillance chest CT. 3. 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 4. Chronic findings include: Stable trace pericardial effusion/thickening. Small hiatal hernia. Aortic Atherosclerosis (ICD10-I70.0) and Emphysema (ICD10-J43.9). Electronically Signed   By: Ilona Sorrel M.D.   On: 01/30/2018 11:27   Mr Jeri Cos BO Contrast  Result Date: 02/09/2018 CLINICAL DATA:  Small-cell lung cancer. Prior prophylactic cranial irradiation. EXAM: MRI HEAD WITHOUT AND WITH CONTRAST TECHNIQUE: Multiplanar, multiecho pulse sequences of the brain and surrounding structures were obtained without and with intravenous contrast. CONTRAST:  64mL MULTIHANCE GADOBENATE DIMEGLUMINE 529 MG/ML IV SOLN COMPARISON:  09/19/2017 FINDINGS: Brain: There is no evidence of acute infarct, intracranial hemorrhage, mass, midline shift, or extra-axial fluid collection. Mild cerebral atrophy is unchanged. Patchy T2 hyperintensities in the periventricular/deep cerebral white matter and pons have progressed without mass effect. A chronic lacunar infarct involving the right corona radiata and caudate is unchanged. No abnormal brain parenchymal or meningeal enhancement is identified. Vascular: Major intracranial vascular flow voids are preserved. Skull and upper cervical spine: Unremarkable bone marrow signal. Sinuses/Orbits: Unremarkable  orbits. Clear paranasal sinuses. Unchanged small left mastoid effusion. Other: None. IMPRESSION: 1. No evidence of intracranial metastases. 2. Progressive white matter T2 hyperintensity likely reflecting post treatment changes. Electronically Signed   By: Logan Bores M.D.   On: 02/09/2018 08:59   Dg Esophagus  Result Date: 01/22/2018 CLINICAL DATA:  Dysphagia. EXAM: ESOPHOGRAM / BARIUM SWALLOW / BARIUM TABLET STUDY TECHNIQUE: Combined double contrast and single contrast examination performed using effervescent crystals, thick barium liquid, and thin barium liquid. The patient was observed with fluoroscopy swallowing a 13 mm barium sulphate tablet. FLUOROSCOPY TIME:  Fluoroscopy Time:  2 minutes, 42 seconds. Radiation Exposure Index (if provided by the fluoroscopic device): 28.0 mGy. Number of Acquired Spot Images: 0 COMPARISON:  CT chest dated December 08, 2017. FINDINGS: The pharyngeal phase of swallowing demonstrates retention in the vallecula and piriform sinuses, with a prominent cricopharyngeal bar. Intermittent tertiary contractions of the mid to distal esophagus. No obstruction to the forward flow of contrast throughout the esophagus and into the stomach. Normal esophageal course and contour. Normal esophageal mucosal pattern. No esophageal stricture, ulceration, or other significant abnormality. Small hiatal hernia. No gastroesophageal reflux occurred spontaneously or was elicited. Delayed passage of a 13 mm barium tablet into the stomach secondary to esophageal dysmotility. IMPRESSION: 1. Intermittent tertiary contractions of the mid to distal esophagus, suggestive of nonspecific dysmotility. 2. Prominent cricopharyngeal bar. 3. Small hiatal hernia. Electronically Signed   By: Orville Govern.D.  On: 01/22/2018 11:50   Nm Pet Image Restag (ps) Skull Base To Thigh  Result Date: 02/09/2018 CLINICAL DATA:  Subsequent treatment strategy for small cell lung cancer. EXAM: NUCLEAR MEDICINE PET SKULL BASE  TO THIGH TECHNIQUE: 9.44 mCi F-18 FDG was injected intravenously. Full-ring PET imaging was performed from the skull base to thigh after the radiotracer. CT data was obtained and used for attenuation correction and anatomic localization. Fasting blood glucose: 85 mg/dl Mediastinal blood pool activity: SUV max 2.04 COMPARISON:  PET-CT 09/27/2016 and recent chest CT 01/30/2018 and abdominal CT scan 02/06/2018. FINDINGS: NECK: No hypermetabolic lymph nodes in the neck. Incidental CT findings: none CHEST: Bulky aorticopulmonary window lymphadenopathy is hypermetabolic with SUV max of 38.1 consistent with recurrent disease. Stable radiation changes involving the left hilum and paramediastinal long. No hypermetabolism in this area. Subpleural airspace opacity in the right lower lobe is hypermetabolic with SUV max of 4.0 but this has the appearance of an infiltrate on the chest CT. I do not see a discrete pulmonary nodule or mass. Incidental CT findings: Surgical changes involving the right breast and scattered macro calcifications and asymmetric breast parenchyma but no focal mass or hypermetabolism. ABDOMEN/PELVIS: 4 cm segment 3 liver lesion is hypermetabolic with SUV max of 01.7 and most consistent with metastatic disease. This has been recently biopsied. No other liver lesions are identified. Small hypermetabolic lesion is noted in the spleen. This is difficult to identified for certain on the CT scan but I believe on the liver windows it measures 11 mm on image number 100 and the SUV max is 10.0. No adrenal gland metastasis are identified. No abdominal/pelvic lymphadenopathy. Incidental CT findings: none SKELETON: There is a metastatic lesion involving the L3 vertebral body with SUV max of 17.3. No other obvious the bony metastatic disease. Incidental CT findings: none IMPRESSION: 1. Bulky aorticopulmonary window hypermetabolic lymphadenopathy consistent with local recurrence. 2. Hypermetabolic hepatic, splenic and  bone lesions consistent with metastatic disease as detailed above. Electronically Signed   By: Marijo Sanes M.D.   On: 02/09/2018 11:32   US Biopsy (liver)  Result Date: 02/06/2018 CLINICAL DATA:  Small-cell lung carcinoma. New posterior left liver lobe lesion on recent CT. EXAM: ULTRASOUND-GUIDED CORE LIVER BIOPSY TECHNIQUE: An ultrasound guided liver biopsy was thoroughly discussed with the patient and questions were answered. The benefits, risks, alternatives, and complications were also discussed. The patient understands and wishes to proceed with the procedure. A verbal as well as written consent was obtained. Survey ultrasound of the liver was performed, the lesion localized, and an appropriate skin entry site was determined. Skin site was marked, prepped with chlorhexidine, and draped in usual sterile fashion, and infiltrated locally with 1% lidocaine. Intravenous Fentanyl and Versed were administered as conscious sedation during continuous monitoring of the patient's level of consciousness and physiological / cardiorespiratory status by the radiology RN, with a total moderate sedation time of 10 minutes. A 17 gauge trocar needle was advanced under ultrasound guidance into the liver to the margin of the lesion. Multiple coaxial 18gauge core samples were then obtained through the guide needle. The guide needle was removed. Post procedure scans demonstrate no apparent complication. COMPLICATIONS: COMPLICATIONS None immediate FINDINGS: Hypoechoic lesion in the left lobe was localized corresponding to the finding on prior scans. Representative core biopsy samples obtained. IMPRESSION: 1. Technically successful ultrasound guided core liver biopsy. 2. Patient developed severe nausea and regional epigastric pain shortly postprocedure. Ultrasound showed no hematoma or evidence of active hemorrhage. Due to persistence of  symptoms despite pain and nausea medications, subsequent CT abdomen without contrast was  performed, which also showed no evidence of hemorrhage or other apparent complication. Symptoms resolved with Toradol and may have represented crossover adverse reaction to the fentanyl as she describes similar episodes of severe nausea after codeine. Electronically Signed   By: Lucrezia Europe M.D.   On: 02/06/2018 16:35    ASSESSMENT AND PLAN:  This is a very pleasant 62 years old white female was limited stage small cell lung cancer, status post 6 cycles of systemic chemotherapy with carboplatin and etoposide concurrent with radiation and followed by prophylactic cranial irradiation. Patient has been in observation for more than a year.  She has been doing fine but recently started complaining of increasing fatigue and weakness as well as headache. Repeat CT scan of the chest showed concerning findings for disease recurrence in the lung as well as new lesion in the liver. I personally and independently reviewed the scan images and discussed the results and showed the images to the patient today.  Unfortunately this is highly suspicious for recurrent small cell lung cancer but other malignancy cannot be excluded. I recommended for the patient to complete the staging workup by ordering a PET scan as well as MRI of the brain to rule out brain metastasis. I would also refer the patient to interventional radiology for consideration of ultrasound-guided core biopsy of the liver mass. I will see the patient back for follow-up visit in around 10 days for evaluation and more detailed discussion of her treatment options based on the biopsy results and imaging studies. She was advised to call immediately if she has any concerning symptoms in the interval. The patient voices understanding of current disease status and treatment options and is in agreement with the current care plan. All questions were answered. The patient knows to call the clinic with any problems, questions or concerns. We can certainly see the  patient much sooner if necessary. Disclaimer: This note was dictated with voice recognition software. Similar sounding words can inadvertently be transcribed and may not be corrected upon review.

## 2018-02-12 ENCOUNTER — Telehealth: Payer: Self-pay | Admitting: Medical Oncology

## 2018-02-12 ENCOUNTER — Telehealth: Payer: Self-pay | Admitting: *Deleted

## 2018-02-12 ENCOUNTER — Other Ambulatory Visit: Payer: Self-pay | Admitting: *Deleted

## 2018-02-12 ENCOUNTER — Telehealth: Payer: Self-pay

## 2018-02-12 ENCOUNTER — Other Ambulatory Visit: Payer: Self-pay | Admitting: Medical Oncology

## 2018-02-12 DIAGNOSIS — I878 Other specified disorders of veins: Secondary | ICD-10-CM

## 2018-02-12 NOTE — Telephone Encounter (Signed)
"  Scheduled for chemotherapy early, 02-17-2018.  Someone says they're working on Port-a-cath being placed before treatment.  I'm trying to save needle sticks after being stuck five times for recent scans."    Call transferred to collaborative.  To help [place] locate veins, instructed patient on hand exercises along with drinking water after she performed pinch test to back of her hand.  Reports skin taking a few moments to spring back.

## 2018-02-12 NOTE — Telephone Encounter (Signed)
Ok to order pot A cath

## 2018-02-12 NOTE — Telephone Encounter (Signed)
Called on 3/14 and spoke with patient concerning upcoming appointment. Per 3/13 los Will mail calender and patient also  Asked about port placement will inform Oncologist. Of this.

## 2018-02-12 NOTE — Telephone Encounter (Signed)
Pt calling about getting a port prior to starting chemo appts. Port ordered.

## 2018-02-16 ENCOUNTER — Telehealth: Payer: Self-pay | Admitting: Medical Oncology

## 2018-02-16 ENCOUNTER — Telehealth: Payer: Self-pay

## 2018-02-16 NOTE — Telephone Encounter (Signed)
Per Insurance  Pt [place] for chemo and tecentriq 02/12/18-09/10/18 auth number [id number]. For enhanced billing use [id number].

## 2018-02-16 NOTE — Telephone Encounter (Signed)
Spoke with RN. Concerning patient getting her labs at [facility] ([facility] Patient will be in office on 3/19 and she will give her a referal. Per 3/18 phone message.

## 2018-02-16 NOTE — Telephone Encounter (Addendum)
Pt notified that chemo/iommun and growth factor auth . appts confirmed with pt.

## 2018-02-17 ENCOUNTER — Inpatient Hospital Stay: Payer: Medicare Other

## 2018-02-17 ENCOUNTER — Other Ambulatory Visit: Payer: Self-pay | Admitting: Medical Oncology

## 2018-02-17 VITALS — BP 135/74 | HR 63 | Temp 98.1°F | Resp 18

## 2018-02-17 DIAGNOSIS — C3432 Malignant neoplasm of lower lobe, left bronchus or lung: Secondary | ICD-10-CM | POA: Diagnosis not present

## 2018-02-17 DIAGNOSIS — T451X5A Adverse effect of antineoplastic and immunosuppressive drugs, initial encounter: Secondary | ICD-10-CM

## 2018-02-17 DIAGNOSIS — Z5111 Encounter for antineoplastic chemotherapy: Secondary | ICD-10-CM

## 2018-02-17 DIAGNOSIS — C3412 Malignant neoplasm of upper lobe, left bronchus or lung: Secondary | ICD-10-CM

## 2018-02-17 DIAGNOSIS — D6481 Anemia due to antineoplastic chemotherapy: Secondary | ICD-10-CM

## 2018-02-17 DIAGNOSIS — C3492 Malignant neoplasm of unspecified part of left bronchus or lung: Secondary | ICD-10-CM

## 2018-02-17 DIAGNOSIS — R5382 Chronic fatigue, unspecified: Secondary | ICD-10-CM

## 2018-02-17 LAB — CBC WITH DIFFERENTIAL (CANCER CENTER ONLY)
BASOS ABS: 0 10*3/uL (ref 0.0–0.1)
Basophils Relative: 0 %
EOS PCT: 3 %
Eosinophils Absolute: 0.2 10*3/uL (ref 0.0–0.5)
HEMATOCRIT: 40.4 % (ref 34.8–46.6)
HEMOGLOBIN: 14 g/dL (ref 11.6–15.9)
LYMPHS ABS: 1.2 10*3/uL (ref 0.9–3.3)
LYMPHS PCT: 21 %
MCH: 32.9 pg (ref 25.1–34.0)
MCHC: 34.7 g/dL (ref 31.5–36.0)
MCV: 95.1 fL (ref 79.5–101.0)
Monocytes Absolute: 0.7 10*3/uL (ref 0.1–0.9)
Monocytes Relative: 12 %
NEUTROS ABS: 3.7 10*3/uL (ref 1.5–6.5)
Neutrophils Relative %: 64 %
PLATELETS: 197 10*3/uL (ref 145–400)
RBC: 4.25 MIL/uL (ref 3.70–5.45)
RDW: 13.6 % (ref 11.2–14.5)
WBC: 5.7 10*3/uL (ref 3.9–10.3)

## 2018-02-17 LAB — CMP (CANCER CENTER ONLY)
ALBUMIN: 3.7 g/dL (ref 3.5–5.0)
ALT: <6 U/L (ref 0–55)
ANION GAP: 10 (ref 3–11)
AST: 11 U/L (ref 5–34)
Alkaline Phosphatase: 47 U/L (ref 40–150)
BUN: 7 mg/dL (ref 7–26)
CHLORIDE: 104 mmol/L (ref 98–109)
CO2: 27 mmol/L (ref 22–29)
Calcium: 8.9 mg/dL (ref 8.4–10.4)
Creatinine: 0.71 mg/dL (ref 0.60–1.10)
GFR, Est AFR Am: >60 mL/min (ref >60)
Glucose, Bld: 85 mg/dL (ref 70–140)
POTASSIUM: 3.6 mmol/L (ref 3.5–5.1)
Sodium: 141 mmol/L (ref 136–145)
Total Bilirubin: 0.7 mg/dL (ref 0.2–1.2)
Total Protein: 7 g/dL (ref 6.4–8.3)

## 2018-02-17 LAB — TSH: TSH: 1.707 u[IU]/mL (ref 0.308–3.960)

## 2018-02-17 MED ORDER — PALONOSETRON HCL INJECTION 0.25 MG/5ML
INTRAVENOUS | Status: AC
Start: 2018-02-17 — End: 2018-02-17
  Filled 2018-02-17: qty 5

## 2018-02-17 MED ORDER — HEPARIN SOD (PORK) LOCK FLUSH 100 UNIT/ML IV SOLN
500.0000 [IU] | Freq: Once | INTRAVENOUS | Status: DC | PRN
  Filled 2018-02-17: qty 5

## 2018-02-17 MED ORDER — DEXAMETHASONE SODIUM PHOSPHATE 10 MG/ML IJ SOLN
10.0000 mg | Freq: Once | INTRAMUSCULAR | Status: AC
  Administered 2018-02-17: 10 mg via INTRAVENOUS

## 2018-02-17 MED ORDER — PALONOSETRON HCL INJECTION 0.25 MG/5ML
0.2500 mg | Freq: Once | INTRAVENOUS | Status: AC
Start: 2018-02-17 — End: 2018-02-17
  Administered 2018-02-17: 0.25 mg via INTRAVENOUS

## 2018-02-17 MED ORDER — SODIUM CHLORIDE 0.9 % IV SOLN
Freq: Once | INTRAVENOUS | Status: AC
  Administered 2018-02-17: 10:00:00 via INTRAVENOUS

## 2018-02-17 MED ORDER — SODIUM CHLORIDE 0.9% FLUSH
10.0000 mL | INTRAVENOUS | Status: DC | PRN
  Filled 2018-02-17: qty 10

## 2018-02-17 MED ORDER — DEXAMETHASONE SODIUM PHOSPHATE 10 MG/ML IJ SOLN
INTRAMUSCULAR | Status: AC
  Filled 2018-02-17: qty 1

## 2018-02-17 MED ORDER — SODIUM CHLORIDE 0.9 % IV SOLN
1200.0000 mg | Freq: Once | INTRAVENOUS | Status: AC
  Administered 2018-02-17: 1200 mg via INTRAVENOUS
  Filled 2018-02-17: qty 20

## 2018-02-17 MED ORDER — SODIUM CHLORIDE 0.9 % IV SOLN
100.0000 mg/m2 | Freq: Once | INTRAVENOUS | Status: AC
  Administered 2018-02-17: 180 mg via INTRAVENOUS
  Filled 2018-02-17: qty 9

## 2018-02-17 MED ORDER — SODIUM CHLORIDE 0.9 % IV SOLN
553.0000 mg | Freq: Once | INTRAVENOUS | Status: AC
  Administered 2018-02-17: 550 mg via INTRAVENOUS
  Filled 2018-02-17: qty 55

## 2018-02-17 MED ORDER — SODIUM CHLORIDE 0.9 % IV SOLN: 10.0000 mg | Freq: Once | INTRAVENOUS | Status: DC

## 2018-02-17 NOTE — Patient Instructions (Signed)
[facility] Discharge Instructions for Patients Receiving Chemotherapy  Today you received the following chemotherapy agents: Tecentriq, Etoposide and Carboplatin.  To help prevent nausea and vomiting after your treatment, we encourage you to take your nausea medication as prescribed.   If you develop nausea and vomiting that is not controlled by your nausea medication, call the clinic.   BELOW ARE SYMPTOMS THAT SHOULD BE REPORTED IMMEDIATELY:  *FEVER GREATER THAN 100.5 F  *CHILLS WITH OR WITHOUT FEVER  NAUSEA AND VOMITING THAT IS NOT CONTROLLED WITH YOUR NAUSEA MEDICATION  *UNUSUAL SHORTNESS OF BREATH  *UNUSUAL BRUISING OR BLEEDING  TENDERNESS IN MOUTH AND THROAT WITH OR WITHOUT PRESENCE OF ULCERS  *URINARY PROBLEMS  *BOWEL PROBLEMS  UNUSUAL RASH Items with * indicate a potential emergency and should be followed up as soon as possible.  Feel free to call the clinic should you have any questions or concerns. The clinic phone number is [phone].  Please show the [place] at check-in to the Emergency Department and triage nurse.  Atezolizumab / Tecentriq injection What is this medicine? ATEZOLIZUMAB (a te zoe LIZ ue mab) is a monoclonal antibody. It is used to treat bladder cancer (urothelial cancer) and non-small cell lung cancer. This medicine may be used for other purposes; ask your health care provider or pharmacist if you have questions. COMMON BRAND NAME(S): Tecentriq What should I tell my health care provider before I take this medicine? They need to know if you have any of these conditions: -diabetes -immune system problems -infection -inflammatory bowel disease -liver disease -lung or breathing disease -lupus -nervous system problems like myasthenia gravis or Guillain-Barre syndrome -organ transplant -an unusual or allergic reaction to atezolizumab, other medicines, foods, dyes, or preservatives -pregnant or trying to get  pregnant -breast-feeding How should I use this medicine? This medicine is for infusion into a vein. It is given by a health care professional in a hospital or clinic setting. A special MedGuide will be given to you before each treatment. Be sure to read this information carefully each time. Talk to your pediatrician regarding the use of this medicine in children. Special care may be needed. Overdosage: If you think you have taken too much of this medicine contact a poison control center or emergency room at once. NOTE: This medicine is only for you. Do not share this medicine with others. What if I miss a dose? It is important not to miss your dose. Call your doctor or health care professional if you are unable to keep an appointment. What may interact with this medicine? Interactions have not been studied. This list may not describe all possible interactions. Give your health care provider a list of all the medicines, herbs, non-prescription drugs, or dietary supplements you use. Also tell them if you smoke, drink alcohol, or use illegal drugs. Some items may interact with your medicine. What should I watch for while using this medicine? Your condition will be monitored carefully while you are receiving this medicine. You may need blood work done while you are taking this medicine. Do not become pregnant while taking this medicine or for at least 5 months after stopping it. Women should inform their doctor if they wish to become pregnant or think they might be pregnant. There is a potential for serious side effects to an unborn child. Talk to your health care professional or pharmacist for more information. Do not breast-feed an infant while taking this medicine or for at least 5 months after  the last dose. What side effects may I notice from receiving this medicine? Side effects that you should report to your doctor or health care professional as soon as possible: -allergic reactions like skin  rash, itching or hives, swelling of the face, lips, or tongue -black, tarry stools -bloody or watery diarrhea -breathing problems -changes in vision -chest pain or chest tightness -chills -facial flushing -fever -headache -signs and symptoms of high blood sugar such as dizziness; dry mouth; dry skin; fruity breath; nausea; stomach pain; increased hunger or thirst; increased urination -signs and symptoms of liver injury like dark yellow or brown urine; general ill feeling or flu-like symptoms; light-colored stools; loss of appetite; nausea; right upper belly pain; unusually weak or tired; yellowing of the eyes or skin -stomach pain -trouble passing urine or change in the amount of urine Side effects that usually do not require medical attention (report to your doctor or health care professional if they continue or are bothersome): -cough -diarrhea -joint pain -muscle pain -muscle weakness -tiredness -weight loss This list may not describe all possible side effects. Call your doctor for medical advice about side effects. You may report side effects to FDA at 1-800-FDA-1088. Where should I keep my medicine? This drug is given in a hospital or clinic and will not be stored at home. NOTE: This sheet is a summary. It may not cover all possible information. If you have questions about this medicine, talk to your doctor, pharmacist, or health care provider.  2018 Elsevier/Gold Standard (2015-12-20 17:54:14)  Etoposide, VP-16 injection What is this medicine? ETOPOSIDE, VP-16 (e toe POE side) is a chemotherapy drug. It is used to treat testicular cancer, lung cancer, and other cancers. This medicine may be used for other purposes; ask your health care provider or pharmacist if you have questions. COMMON BRAND NAME(S): Etopophos, Toposar, VePesid What should I tell my health care provider before I take this medicine? They need to know if you have any of these conditions: -infection -kidney  disease -liver disease -low blood counts, like low white cell, platelet, or red cell counts -an unusual or allergic reaction to etoposide, other medicines, foods, dyes, or preservatives -pregnant or trying to get pregnant -breast-feeding How should I use this medicine? This medicine is for infusion into a vein. It is administered in a hospital or clinic by a specially trained health care professional. Talk to your pediatrician regarding the use of this medicine in children. Special care may be needed. Overdosage: If you think you have taken too much of this medicine contact a poison control center or emergency room at once. NOTE: This medicine is only for you. Do not share this medicine with others. What if I miss a dose? It is important not to miss your dose. Call your doctor or health care professional if you are unable to keep an appointment. What may interact with this medicine? -aspirin -certain medications for seizures like carbamazepine, phenobarbital, phenytoin, valproic acid -cyclosporine -levamisole -warfarin This list may not describe all possible interactions. Give your health care provider a list of all the medicines, herbs, non-prescription drugs, or dietary supplements you use. Also tell them if you smoke, drink alcohol, or use illegal drugs. Some items may interact with your medicine. What should I watch for while using this medicine? Visit your doctor for checks on your progress. This drug may make you feel generally unwell. This is not uncommon, as chemotherapy can affect healthy cells as well as cancer cells. Report any side effects. Continue  your course of treatment even though you feel ill unless your doctor tells you to stop. In some cases, you may be given additional medicines to help with side effects. Follow all directions for their use. Call your doctor or health care professional for advice if you get a fever, chills or sore throat, or other symptoms of a cold or  flu. Do not treat yourself. This drug decreases your body's ability to fight infections. Try to avoid being around people who are sick. This medicine may increase your risk to bruise or bleed. Call your doctor or health care professional if you notice any unusual bleeding. Talk to your doctor about your risk of cancer. You may be more at risk for certain types of cancers if you take this medicine. Do not become pregnant while taking this medicine or for at least 6 months after stopping it. Women should inform their doctor if they wish to become pregnant or think they might be pregnant. Women of child-bearing potential will need to have a negative pregnancy test before starting this medicine. There is a potential for serious side effects to an unborn child. Talk to your health care professional or pharmacist for more information. Do not breast-feed an infant while taking this medicine. Men must use a latex condom during sexual contact with a woman while taking this medicine and for at least 4 months after stopping it. A latex condom is needed even if you have had a vasectomy. Contact your doctor right away if your partner becomes pregnant. Do not donate sperm while taking this medicine and for at least 4 months after you stop taking this medicine. Men should inform their doctors if they wish to father a child. This medicine may lower sperm counts. What side effects may I notice from receiving this medicine? Side effects that you should report to your doctor or health care professional as soon as possible: -allergic reactions like skin rash, itching or hives, swelling of the face, lips, or tongue -low blood counts - this medicine may decrease the number of white blood cells, red blood cells and platelets. You may be at increased risk for infections and bleeding. -signs of infection - fever or chills, cough, sore throat, pain or difficulty passing urine -signs of decreased platelets or bleeding - bruising,  pinpoint red spots on the skin, black, tarry stools, blood in the urine -signs of decreased red blood cells - unusually weak or tired, fainting spells, lightheadedness -breathing problems -changes in vision -mouth or throat sores or ulcers -pain, redness, swelling or irritation at the injection site -pain, tingling, numbness in the hands or feet -redness, blistering, peeling or loosening of the skin, including inside the mouth -seizures -vomiting Side effects that usually do not require medical attention (report to your doctor or health care professional if they continue or are bothersome): -diarrhea -hair loss -loss of appetite -nausea -stomach pain This list may not describe all possible side effects. Call your doctor for medical advice about side effects. You may report side effects to FDA at 1-800-FDA-1088. Where should I keep my medicine? This drug is given in a hospital or clinic and will not be stored at home. NOTE: This sheet is a summary. It may not cover all possible information. If you have questions about this medicine, talk to your doctor, pharmacist, or health care provider.  2018 Elsevier/Gold Standard (2015-11-10 11:53:23)  Carboplatin injection What is this medicine? CARBOPLATIN (KAR boe pla tin) is a chemotherapy drug. It targets fast dividing  cells, like cancer cells, and causes these cells to die. This medicine is used to treat ovarian cancer and many other cancers. This medicine may be used for other purposes; ask your health care provider or pharmacist if you have questions. COMMON BRAND NAME(S): Paraplatin What should I tell my health care provider before I take this medicine? They need to know if you have any of these conditions: -blood disorders -hearing problems -kidney disease -recent or ongoing radiation therapy -an unusual or allergic reaction to carboplatin, cisplatin, other chemotherapy, other medicines, foods, dyes, or preservatives -pregnant or  trying to get pregnant -breast-feeding How should I use this medicine? This drug is usually given as an infusion into a vein. It is administered in a hospital or clinic by a specially trained health care professional. Talk to your pediatrician regarding the use of this medicine in children. Special care may be needed. Overdosage: If you think you have taken too much of this medicine contact a poison control center or emergency room at once. NOTE: This medicine is only for you. Do not share this medicine with others. What if I miss a dose? It is important not to miss a dose. Call your doctor or health care professional if you are unable to keep an appointment. What may interact with this medicine? -medicines for seizures -medicines to increase blood counts like filgrastim, pegfilgrastim, sargramostim -some antibiotics like amikacin, gentamicin, neomycin, streptomycin, tobramycin -vaccines Talk to your doctor or health care professional before taking any of these medicines: -acetaminophen -aspirin -ibuprofen -ketoprofen -naproxen This list may not describe all possible interactions. Give your health care provider a list of all the medicines, herbs, non-prescription drugs, or dietary supplements you use. Also tell them if you smoke, drink alcohol, or use illegal drugs. Some items may interact with your medicine. What should I watch for while using this medicine? Your condition will be monitored carefully while you are receiving this medicine. You will need important blood work done while you are taking this medicine. This drug may make you feel generally unwell. This is not uncommon, as chemotherapy can affect healthy cells as well as cancer cells. Report any side effects. Continue your course of treatment even though you feel ill unless your doctor tells you to stop. In some cases, you may be given additional medicines to help with side effects. Follow all directions for their use. Call your  doctor or health care professional for advice if you get a fever, chills or sore throat, or other symptoms of a cold or flu. Do not treat yourself. This drug decreases your body's ability to fight infections. Try to avoid being around people who are sick. This medicine may increase your risk to bruise or bleed. Call your doctor or health care professional if you notice any unusual bleeding. Be careful brushing and flossing your teeth or using a toothpick because you may get an infection or bleed more easily. If you have any dental work done, tell your dentist you are receiving this medicine. Avoid taking products that contain aspirin, acetaminophen, ibuprofen, naproxen, or ketoprofen unless instructed by your doctor. These medicines may hide a fever. Do not become pregnant while taking this medicine. Women should inform their doctor if they wish to become pregnant or think they might be pregnant. There is a potential for serious side effects to an unborn child. Talk to your health care professional or pharmacist for more information. Do not breast-feed an infant while taking this medicine. What side effects may I  notice from receiving this medicine? Side effects that you should report to your doctor or health care professional as soon as possible: -allergic reactions like skin rash, itching or hives, swelling of the face, lips, or tongue -signs of infection - fever or chills, cough, sore throat, pain or difficulty passing urine -signs of decreased platelets or bleeding - bruising, pinpoint red spots on the skin, black, tarry stools, nosebleeds -signs of decreased red blood cells - unusually weak or tired, fainting spells, lightheadedness -breathing problems -changes in hearing -changes in vision -chest pain -high blood pressure -low blood counts - This drug may decrease the number of white blood cells, red blood cells and platelets. You may be at increased risk for infections and bleeding. -nausea  and vomiting -pain, swelling, redness or irritation at the injection site -pain, tingling, numbness in the hands or feet -problems with balance, talking, walking -trouble passing urine or change in the amount of urine Side effects that usually do not require medical attention (report to your doctor or health care professional if they continue or are bothersome): -hair loss -loss of appetite -metallic taste in the mouth or changes in taste This list may not describe all possible side effects. Call your doctor for medical advice about side effects. You may report side effects to FDA at 1-800-FDA-1088. Where should I keep my medicine? This drug is given in a hospital or clinic and will not be stored at home. NOTE: This sheet is a summary. It may not cover all possible information. If you have questions about this medicine, talk to your doctor, pharmacist, or health care provider.  2018 Elsevier/Gold Standard (2008-02-23 14:38:05)

## 2018-02-18 ENCOUNTER — Other Ambulatory Visit: Payer: Self-pay | Admitting: Medical Oncology

## 2018-02-18 ENCOUNTER — Inpatient Hospital Stay: Payer: Medicare Other

## 2018-02-18 ENCOUNTER — Encounter: Payer: Self-pay | Admitting: Internal Medicine

## 2018-02-18 DIAGNOSIS — Z95828 Presence of other vascular implants and grafts: Secondary | ICD-10-CM

## 2018-02-18 DIAGNOSIS — C3432 Malignant neoplasm of lower lobe, left bronchus or lung: Secondary | ICD-10-CM | POA: Diagnosis not present

## 2018-02-18 DIAGNOSIS — C3492 Malignant neoplasm of unspecified part of left bronchus or lung: Secondary | ICD-10-CM

## 2018-02-18 MED ORDER — DEXAMETHASONE SODIUM PHOSPHATE 10 MG/ML IJ SOLN
10.0000 mg | Freq: Once | INTRAMUSCULAR | Status: AC
  Administered 2018-02-18: 10 mg via INTRAVENOUS

## 2018-02-18 MED ORDER — DEXAMETHASONE SODIUM PHOSPHATE 10 MG/ML IJ SOLN
INTRAMUSCULAR | Status: AC
  Filled 2018-02-18: qty 1

## 2018-02-18 MED ORDER — SODIUM CHLORIDE 0.9 % IV SOLN
Freq: Once | INTRAVENOUS | Status: AC
  Administered 2018-02-18: 09:00:00 via INTRAVENOUS

## 2018-02-18 MED ORDER — SODIUM CHLORIDE 0.9 % IV SOLN
100.0000 mg/m2 | Freq: Once | INTRAVENOUS | Status: AC
  Administered 2018-02-18: 180 mg via INTRAVENOUS
  Filled 2018-02-18: qty 9

## 2018-02-18 MED ORDER — LIDOCAINE-PRILOCAINE 2.5-2.5 % EX CREA: 1.0000 "application " | TOPICAL_CREAM | CUTANEOUS | 0 refills | Status: AC | PRN

## 2018-02-18 NOTE — Progress Notes (Signed)
Called emla cream to local pharmacy

## 2018-02-18 NOTE — Patient Instructions (Signed)
[facility] Discharge Instructions for Patients Receiving Chemotherapy  Today you received the following chemotherapy agents:  Etoposide (VP 16)  To help prevent nausea and vomiting after your treatment, we encourage you to take your nausea medication as prescribed.   If you develop nausea and vomiting that is not controlled by your nausea medication, call the clinic.   BELOW ARE SYMPTOMS THAT SHOULD BE REPORTED IMMEDIATELY:  *FEVER GREATER THAN 100.5 F  *CHILLS WITH OR WITHOUT FEVER  NAUSEA AND VOMITING THAT IS NOT CONTROLLED WITH YOUR NAUSEA MEDICATION  *UNUSUAL SHORTNESS OF BREATH  *UNUSUAL BRUISING OR BLEEDING  TENDERNESS IN MOUTH AND THROAT WITH OR WITHOUT PRESENCE OF ULCERS  *URINARY PROBLEMS  *BOWEL PROBLEMS  UNUSUAL RASH Items with * indicate a potential emergency and should be followed up as soon as possible.  Feel free to call the clinic should you have any questions or concerns. The clinic phone number is [phone].  Please show the [facility] at check-in to the Emergency Department and triage nurse.

## 2018-02-18 NOTE — Progress Notes (Signed)
Received PA request for Lidocaine-Prilocaine cream.  Submitted via Cover My Meds:   Marcy Centner (Key: [id number])   Your information has been submitted to [place]. [employer] [place] will review the request and notify you of the determination decision directly, typically within 3 business days of your submission and once all necessary information is received. You will also receive your request decision electronically. To check for an update later, open the request again from your dashboard. If [employer] has not responded within the specified timeframe or if you have any questions about your PA submission, contact [facility] directly at [facility]) [phone] or ([place]) [phone].

## 2018-02-19 ENCOUNTER — Inpatient Hospital Stay: Payer: Medicare Other

## 2018-02-19 ENCOUNTER — Encounter: Payer: Self-pay | Admitting: Internal Medicine

## 2018-02-19 VITALS — BP 141/67 | HR 55 | Temp 97.7°F | Resp 16

## 2018-02-19 DIAGNOSIS — C3432 Malignant neoplasm of lower lobe, left bronchus or lung: Secondary | ICD-10-CM | POA: Diagnosis not present

## 2018-02-19 DIAGNOSIS — C3492 Malignant neoplasm of unspecified part of left bronchus or lung: Secondary | ICD-10-CM

## 2018-02-19 MED ORDER — SODIUM CHLORIDE 0.9 % IV SOLN
100.0000 mg/m2 | Freq: Once | INTRAVENOUS | Status: AC
  Administered 2018-02-19: 180 mg via INTRAVENOUS
  Filled 2018-02-19: qty 9

## 2018-02-19 MED ORDER — DEXAMETHASONE SODIUM PHOSPHATE 10 MG/ML IJ SOLN
10.0000 mg | Freq: Once | INTRAMUSCULAR | Status: AC
  Administered 2018-02-19: 10 mg via INTRAVENOUS

## 2018-02-19 MED ORDER — DEXAMETHASONE SODIUM PHOSPHATE 10 MG/ML IJ SOLN
INTRAMUSCULAR | Status: AC
  Filled 2018-02-19: qty 1

## 2018-02-19 MED ORDER — SODIUM CHLORIDE 0.9 % IV SOLN
Freq: Once | INTRAVENOUS | Status: AC
  Administered 2018-02-19: 09:00:00 via INTRAVENOUS

## 2018-02-19 MED ORDER — PEGFILGRASTIM 6 MG/0.6ML ~~LOC~~ PSKT
6.0000 mg | PREFILLED_SYRINGE | Freq: Once | SUBCUTANEOUS | Status: AC
  Administered 2018-02-19: 6 mg via SUBCUTANEOUS

## 2018-02-19 MED ORDER — PEGFILGRASTIM 6 MG/0.6ML ~~LOC~~ PSKT
PREFILLED_SYRINGE | SUBCUTANEOUS | Status: AC
  Filled 2018-02-19: qty 0.6

## 2018-02-19 NOTE — Progress Notes (Signed)
Reviewed Cover My Meds for PA determination for Lidocaine-Prilocaine cream:   Marra Ballinger KeyGaynelle Cage - Rx #: [id number] Need help? Call us at [phone]  Archivedon March 20 Outcome  Approvedon March 20  Effective from 02/18/2018 through 02/19/2019.  DrugLidocaine-Prilocaine 2.5-2.5% EX CREA  FormBlue Cross [place] Medicare Part D [facility](April) to advise of approval. She states they received notification and it is ready.

## 2018-02-19 NOTE — Patient Instructions (Signed)
[facility] Discharge Instructions for Patients Receiving Chemotherapy  Today you received the following chemotherapy agents Etoposide (Vepesid).  To help prevent nausea and vomiting after your treatment, we encourage you to take your nausea medication as directed.  If you develop nausea and vomiting that is not controlled by your nausea medication, call the clinic.   BELOW ARE SYMPTOMS THAT SHOULD BE REPORTED IMMEDIATELY:  *FEVER GREATER THAN 100.5 F  *CHILLS WITH OR WITHOUT FEVER  NAUSEA AND VOMITING THAT IS NOT CONTROLLED WITH YOUR NAUSEA MEDICATION  *UNUSUAL SHORTNESS OF BREATH  *UNUSUAL BRUISING OR BLEEDING  TENDERNESS IN MOUTH AND THROAT WITH OR WITHOUT PRESENCE OF ULCERS  *URINARY PROBLEMS  *BOWEL PROBLEMS  UNUSUAL RASH Items with * indicate a potential emergency and should be followed up as soon as possible.  Feel free to call the clinic should you have any questions or concerns. The clinic phone number is [phone].  Please show the [facility] at check-in to the Emergency Department and triage nurse.

## 2018-02-21 ENCOUNTER — Ambulatory Visit: Payer: Medicare Other

## 2018-02-23 ENCOUNTER — Encounter (HOSPITAL_COMMUNITY)
Admission: RE | Admit: 2018-02-23 | Discharge: 2018-02-23 | Disposition: A | Discharge status: Home / Self Care | Payer: Medicare Other | Source: Ambulatory Visit | Attending: Internal Medicine | Admitting: Internal Medicine

## 2018-02-23 DIAGNOSIS — C3492 Malignant neoplasm of unspecified part of left bronchus or lung: Secondary | ICD-10-CM | POA: Diagnosis not present

## 2018-02-23 LAB — CBC WITH DIFFERENTIAL/PLATELET
Basophils Absolute: 0 10*3/uL (ref 0.0–0.1)
Basophils Relative: 0 %
EOS PCT: 2 %
Eosinophils Absolute: 0.2 10*3/uL (ref 0.0–0.7)
HEMATOCRIT: 41 % (ref 36.0–46.0)
Hemoglobin: 14.1 g/dL (ref 12.0–15.0)
LYMPHS ABS: 1.2 10*3/uL (ref 0.7–4.0)
Lymphocytes Relative: 16 %
MCH: 31.9 pg (ref 26.0–34.0)
MCHC: 34.4 g/dL (ref 30.0–36.0)
MCV: 92.8 fL (ref 78.0–100.0)
MONO ABS: 0.1 10*3/uL (ref 0.1–1.0)
Monocytes Relative: 1 %
NEUTROS ABS: 6.2 10*3/uL (ref 1.7–7.7)
Neutrophils Relative %: 81 %
PLATELETS: 114 10*3/uL — AB (ref 150–400)
RBC: 4.42 MIL/uL (ref 3.87–5.11)
RDW: 12.7 % (ref 11.5–15.5)
WBC: 7.7 10*3/uL (ref 4.0–10.5)

## 2018-02-23 LAB — COMPREHENSIVE METABOLIC PANEL
ALT: 10 U/L — ABNORMAL LOW (ref 14–54)
ANION GAP: 13 (ref 5–15)
AST: 14 U/L — AB (ref 15–41)
Albumin: 3.8 g/dL (ref 3.5–5.0)
Alkaline Phosphatase: 62 U/L (ref 38–126)
BILIRUBIN TOTAL: 1.2 mg/dL (ref 0.3–1.2)
BUN: 13 mg/dL (ref 6–20)
CHLORIDE: 97 mmol/L — AB (ref 101–111)
CO2: 28 mmol/L (ref 22–32)
Calcium: 8.6 mg/dL — ABNORMAL LOW (ref 8.9–10.3)
Creatinine, Ser: 0.68 mg/dL (ref 0.44–1.00)
GFR calc non Af Amer: >60 mL/min (ref >60)
Glucose, Bld: 103 mg/dL — ABNORMAL HIGH (ref 65–99)
POTASSIUM: 3.3 mmol/L — AB (ref 3.5–5.1)
Sodium: 138 mmol/L (ref 135–145)
TOTAL PROTEIN: 7 g/dL (ref 6.5–8.1)

## 2018-02-24 ENCOUNTER — Other Ambulatory Visit: Payer: Medicare Other

## 2018-02-24 ENCOUNTER — Other Ambulatory Visit: Payer: Self-pay | Admitting: Radiology

## 2018-02-26 ENCOUNTER — Ambulatory Visit (HOSPITAL_COMMUNITY)
Admission: RE | Admit: 2018-02-26 | Discharge: 2018-02-26 | Disposition: A | Discharge status: Home / Self Care | Payer: Medicare Other | Source: Ambulatory Visit | Attending: Internal Medicine | Admitting: Internal Medicine

## 2018-02-26 ENCOUNTER — Encounter (HOSPITAL_COMMUNITY): Payer: Self-pay

## 2018-02-26 DIAGNOSIS — I878 Other specified disorders of veins: Secondary | ICD-10-CM | POA: Insufficient documentation

## 2018-02-26 DIAGNOSIS — Z79890 Hormone replacement therapy (postmenopausal): Secondary | ICD-10-CM | POA: Diagnosis not present

## 2018-02-26 DIAGNOSIS — E039 Hypothyroidism, unspecified: Secondary | ICD-10-CM | POA: Insufficient documentation

## 2018-02-26 DIAGNOSIS — K589 Irritable bowel syndrome without diarrhea: Secondary | ICD-10-CM | POA: Insufficient documentation

## 2018-02-26 DIAGNOSIS — M797 Fibromyalgia: Secondary | ICD-10-CM | POA: Diagnosis not present

## 2018-02-26 DIAGNOSIS — Z9889 Other specified postprocedural states: Secondary | ICD-10-CM | POA: Diagnosis not present

## 2018-02-26 DIAGNOSIS — Z880 Allergy status to penicillin: Secondary | ICD-10-CM | POA: Diagnosis not present

## 2018-02-26 DIAGNOSIS — Z881 Allergy status to other antibiotic agents status: Secondary | ICD-10-CM | POA: Insufficient documentation

## 2018-02-26 DIAGNOSIS — Z79899 Other long term (current) drug therapy: Secondary | ICD-10-CM | POA: Insufficient documentation

## 2018-02-26 DIAGNOSIS — D709 Neutropenia, unspecified: Secondary | ICD-10-CM | POA: Diagnosis not present

## 2018-02-26 DIAGNOSIS — I10 Essential (primary) hypertension: Secondary | ICD-10-CM | POA: Diagnosis not present

## 2018-02-26 DIAGNOSIS — D696 Thrombocytopenia, unspecified: Secondary | ICD-10-CM | POA: Insufficient documentation

## 2018-02-26 DIAGNOSIS — Z86010 Personal history of colonic polyps: Secondary | ICD-10-CM | POA: Diagnosis not present

## 2018-02-26 DIAGNOSIS — K219 Gastro-esophageal reflux disease without esophagitis: Secondary | ICD-10-CM | POA: Insufficient documentation

## 2018-02-26 DIAGNOSIS — Z885 Allergy status to narcotic agent status: Secondary | ICD-10-CM | POA: Diagnosis not present

## 2018-02-26 DIAGNOSIS — Z5309 Procedure and treatment not carried out because of other contraindication: Secondary | ICD-10-CM | POA: Diagnosis not present

## 2018-02-26 DIAGNOSIS — Z9049 Acquired absence of other specified parts of digestive tract: Secondary | ICD-10-CM | POA: Diagnosis not present

## 2018-02-26 DIAGNOSIS — C7802 Secondary malignant neoplasm of left lung: Secondary | ICD-10-CM | POA: Diagnosis not present

## 2018-02-26 DIAGNOSIS — C3490 Malignant neoplasm of unspecified part of unspecified bronchus or lung: Secondary | ICD-10-CM | POA: Diagnosis not present

## 2018-02-26 DIAGNOSIS — Z5111 Encounter for antineoplastic chemotherapy: Secondary | ICD-10-CM | POA: Diagnosis not present

## 2018-02-26 DIAGNOSIS — J449 Chronic obstructive pulmonary disease, unspecified: Secondary | ICD-10-CM | POA: Diagnosis not present

## 2018-02-26 LAB — CBC WITH DIFFERENTIAL/PLATELET
BASOS PCT: 0 %
Basophils Absolute: 0 10*3/uL (ref 0.0–0.1)
EOS PCT: 6 %
Eosinophils Absolute: 0.1 10*3/uL (ref 0.0–0.7)
HEMATOCRIT: 37.7 % (ref 36.0–46.0)
HEMOGLOBIN: 12.8 g/dL (ref 12.0–15.0)
LYMPHS ABS: 0.7 10*3/uL (ref 0.7–4.0)
LYMPHS PCT: 62 %
MCH: 31.4 pg (ref 26.0–34.0)
MCHC: 34 g/dL (ref 30.0–36.0)
MCV: 92.4 fL (ref 78.0–100.0)
MONOS PCT: 11 %
Monocytes Absolute: 0.1 10*3/uL (ref 0.1–1.0)
NEUTROS ABS: 0.2 10*3/uL — AB (ref 1.7–7.7)
Neutrophils Relative %: 21 %
Platelets: 42 10*3/uL — ABNORMAL LOW (ref 150–400)
RBC: 4.08 MIL/uL (ref 3.87–5.11)
RDW: 12.6 % (ref 11.5–15.5)
WBC: 1.1 10*3/uL — CL (ref 4.0–10.5)

## 2018-02-26 LAB — BASIC METABOLIC PANEL
ANION GAP: 12 (ref 5–15)
BUN: 12 mg/dL (ref 6–20)
CO2: 29 mmol/L (ref 22–32)
Calcium: 8.4 mg/dL — ABNORMAL LOW (ref 8.9–10.3)
Chloride: 99 mmol/L — ABNORMAL LOW (ref 101–111)
Creatinine, Ser: 0.65 mg/dL (ref 0.44–1.00)
GFR calc Af Amer: >60 mL/min (ref >60)
GFR calc non Af Amer: >60 mL/min (ref >60)
GLUCOSE: 97 mg/dL (ref 65–99)
POTASSIUM: 2.7 mmol/L — AB (ref 3.5–5.1)
Sodium: 140 mmol/L (ref 135–145)

## 2018-02-26 LAB — PROTIME-INR
INR: 0.9
Prothrombin Time: 12.1 seconds (ref 11.4–15.2)

## 2018-02-26 MED ORDER — CLINDAMYCIN PHOSPHATE 900 MG/50ML IV SOLN: 900.0000 mg | Freq: Once | INTRAVENOUS | Status: DC

## 2018-02-26 MED ORDER — ONDANSETRON HCL 4 MG/2ML IJ SOLN
4.0000 mg | Freq: Once | INTRAMUSCULAR | Status: AC
  Administered 2018-02-26: 4 mg via INTRAVENOUS
  Filled 2018-02-26: qty 2

## 2018-02-26 MED ORDER — SODIUM CHLORIDE 0.9 % IV SOLN
INTRAVENOUS | Status: DC
  Administered 2018-02-26: 08:00:00 via INTRAVENOUS

## 2018-02-26 NOTE — H&P (Addendum)
Referring Physician(s): Mohamed,Mohamed  Supervising Physician: Aletta Edouard  Patient Status:  [facility]  Chief Complaint: "I'm here to get a port a cath"   Subjective: Patient familiar to IR service from prior liver lesion biopsy on 02/06/18.  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  She now has evidence of recurrent disease on imaging/positive liver biospy and presents again today for Port-A-Cath placement for additional chemotherapy.  She currently denies fever, abdominal pain, vomiting or abnormal bleeding.  She does have mild sore throat, headache, intermittent chest discomfort, some dyspnea with exertion, occasional cough, back pain and nausea.  Past Medical History:  Diagnosis Date  . 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 Anxiety    takes Prozac daily  . Arthritis   . Benign fundic gland polyps of stomach   . Colon polyps   . COPD (chronic obstructive pulmonary disease) ([place])   . Dehydration 03/06/2017  . Diverticulitis   . Dyspnea    with exertion  . Encounter for antineoplastic chemotherapy 12/03/2016  . Fibromyalgia   . GERD (gastroesophageal reflux disease)    takes Pantoprazole daily  . Hypertension    takes Metoprolol,Triamterene-HCTZ,and Amlodipine daily  . Hypothyroidism    takes Synthroid daily  . IBS (irritable bowel syndrome)   . lung ca dx'd 10/02/2016   skin, lung  . PONV (postoperative nausea and vomiting)    pt also states that she had some difficulty breathing after cervical fusion   Past Surgical History:  Procedure Laterality Date  . BIOPSY N/A 05/25/2013   Procedure: BIOPSIES (Random Colon; Duodenal; Gastric);  Surgeon: Danie Binder, MD;  Location: [facility];  Service: Endoscopy;  Laterality: N/A;  . BLADDER SUSPENSION    . BREAST ENHANCEMENT SURGERY    . BREAST IMPLANT REMOVAL    . CERVICAL FUSION  AUG 2013  . CHOLECYSTECTOMY  1999  . COLONOSCOPY   2007 [facility]   POLYPS  . COLONOSCOPY WITH PROPOFOL N/A 05/25/2013   Procedure: COLONOSCOPY WITH PROPOFOL(at cecum 0957) total withdrawal time=68min);  Surgeon: Danie Binder, MD;  Location: [facility];  Service: Endoscopy;  Laterality: N/A;  . ESOPHAGOGASTRODUODENOSCOPY (EGD) WITH PROPOFOL N/A 05/25/2013   Procedure: ESOPHAGOGASTRODUODENOSCOPY (EGD) WITH PROPOFOL;  Surgeon: Danie Binder, MD;  Location: [facility];  Service: Endoscopy;  Laterality: N/A;  . FOOT SURGERY    . POLYPECTOMY N/A 05/25/2013   Procedure: POLYPECTOMY (Rectal and Gastric);  Surgeon: Danie Binder, MD;  Location: [facility];  Service: Endoscopy;  Laterality: N/A;  . TONSILLECTOMY    . UPPER GASTROINTESTINAL ENDOSCOPY    . VIDEO BRONCHOSCOPY WITH ENDOBRONCHIAL ULTRASOUND  09/12/2016   Procedure: VIDEO BRONCHOSCOPY WITH ENDOBRONCHIAL ULTRASOUND AND BIOPSY;  Surgeon: Juanito Doom, MD;  Location: [facility];  Service: Cardiopulmonary;;    Allergies: Penicillins; Codeine; Fentanyl; Ranitidine hcl; Keflex [cephalexin]; and Lyrica [pregabalin]  Medications: Prior to Admission medications   Medication Sig Start Date End Date Taking? Authorizing Provider  albuterol (PROVENTIL HFA;VENTOLIN HFA) 108 (90 Base) MCG/ACT inhaler Inhale 2 puffs into the lungs every 4 (four) hours as needed for wheezing or shortness of breath.   Yes [provider]  ALPRAZolam (XANAX) 0.25 MG tablet Take 2 tablets (0.5 mg total) by mouth at bedtime as needed for anxiety. 02/11/18  Yes Curt Bears, MD  amLODipine (NORVASC) 5 MG tablet Take 5 mg by mouth daily.     Yes [provider]  dicyclomine (BENTYL) 20  MG tablet Take 20 mg by mouth 3 (three) times daily as needed for spasms.   Yes [provider]  estazolam (PROSOM) 2 MG tablet Take 2 mg by mouth at bedtime.   Yes [provider]  estradiol (ESTRACE) 2 MG tablet Take 2 mg by mouth daily.     Yes [provider]  FLUoxetine (PROZAC) 40 MG capsule Take 40 mg by  mouth daily.    Yes [provider]  HYDROcodone-acetaminophen (NORCO/VICODIN) 5-325 MG tablet Take 1 tablet by mouth every 4 (four) hours as needed for moderate pain.    Yes [provider]  levothyroxine (SYNTHROID, LEVOTHROID) 50 MCG tablet Take 50 mcg by mouth daily before breakfast.    Yes [provider]  loperamide (IMODIUM) 2 MG capsule Take 2 mg by mouth every 2 (two) hours as needed for diarrhea or loose stools.   Yes [provider]  metoprolol succinate (TOPROL-XL) 25 MG 24 hr tablet Take 25 mg by mouth daily.   Yes [provider]  ondansetron (ZOFRAN-ODT) 4 MG disintegrating tablet DISSOLVE 1 TABLET BY MOUTH EVERY 8 HOURS AS NEEDED FOR NAUSEA AND VOMITING. 02/05/18  Yes Curt Bears, MD  pantoprazole (PROTONIX) 40 MG tablet Take 40 mg by mouth 2 (two) times daily before a meal.    Yes [provider]  promethazine (PHENERGAN) 25 MG tablet Take 1 tablet (25 mg total) by mouth every 6 (six) hours as needed. 02/11/18  Yes Curt Bears, MD  senna-docusate (SENOKOT-S) 8.6-50 MG tablet Take 1 tablet by mouth daily as needed. 07/17/12  Yes [provider]  lidocaine-prilocaine (EMLA) cream Apply 1 application topically as needed. To numb skin over port a cath: Squeeze a  small amount on cotton ball and place over port site 1-2 hours prior to chemotherapy. 02/18/18   Curt Bears, MD  triamterene-hydrochlorothiazide (DYAZIDE) 37.5-25 MG capsule Take 1 capsule by mouth daily.    [provider]     Vital Signs: BP 120/83 (BP Location: Right Arm)   Pulse 81   Temp 97.7 F (36.5 C) (Oral)   Resp 16   SpO2 100%   Physical Exam awake, alert.  Chest clear to auscultation bilaterally.  Heart with regular rate and rhythm, positive murmur.  Abdomen soft, positive bowel sounds, nontender.  No significant lower extremity edema.  Imaging: No results found.  Labs:  CBC: Recent Labs    08/22/17 1231 12/08/17 1045  01/30/18 0757 02/06/18 1216 02/17/18 0814 02/23/18 1048  WBC 7.5 6.5 6.1 6.5 5.7 7.7  HGB 13.2 13.8  --  13.8  --  14.1  HCT 38.7 40.6 40.2 40.0 40.4 41.0  PLT 319 173 181 206 197 114*    COAGS: Recent Labs    02/06/18 1216 02/26/18 0744  INR 0.96 0.90  APTT 29  --     BMP: Recent Labs    01/30/18 0757 02/17/18 0814 02/23/18 1048 02/26/18 0744  NA 141 141 138 140  K 3.6 3.6 3.3* PENDING  CL 103 104 97* 99*  CO2 27 27 28 29   GLUCOSE 85 85 103* 97  BUN 9 7 13 12   CALCIUM 9.2 8.9 8.6* 8.4*  CREATININE 0.73 0.71 0.68 0.65  GFRNONAA >60 >60 >60 >60  GFRAA >60 >60 >60 >60    LIVER FUNCTION TESTS: Recent Labs    12/08/17 1045 01/30/18 0757 02/17/18 0814 02/23/18 1048  BILITOT 0.6 0.4 0.7 1.2  AST 9 9 11  14*  ALT 7 <6 <6  10*  ALKPHOS 39* 47 47 62  PROT 6.7 6.6 7.0 7.0  ALBUMIN 3.6 3.5 3.7 3.8    Assessment and Plan: Patient with history of recurrent metastatic small cell left lung cancer; presents today for Port-A-Cath placement for chemotherapy. Risks and benefits of image guided port-a-catheter placement was discussed with the patient including, but not limited to bleeding, infection, pneumothorax, or fibrin sheath development and need for additional procedures.  All of the patient's questions were answered, patient is agreeable to proceed. Consent signed and in chart.  Patient's labs today include potassium 2.7, WBC 1.1, platelets 42k.  Dr. Julien Nordmann notified.  Will reschedule patient for Port-A-Cath next week.  Dr. Worthy Flank nurse will notify patient regarding correction of potassium level.   Electronically Signed: D. Rowe Robert, PA-C 02/26/2018, 8:31 AM   I spent a total of 20 minutes at the the patient's bedside AND on the patient's hospital floor or unit, greater than 50% of which was counseling/coordinating care for port a cath placement

## 2018-02-26 NOTE — Progress Notes (Signed)
Procedure cancelled due to low WBC.

## 2018-02-26 NOTE — Progress Notes (Addendum)
CRITICAL VALUE ALERT  Critical Value:  WBC 1.1 & Potassium 2.7  Date & Time Notied:  02/26/18 at 0843  Provider Notified: Rowe Robert, PA  Orders Received/Actions taken: No further orders at this time.

## 2018-02-26 NOTE — Discharge Instructions (Signed)
Implanted Port Insertion, Care After °This sheet gives you information about how to care for yourself after your procedure. Your health care provider may also give you more specific instructions. If you have problems or questions, contact your health care provider. °What can I expect after the procedure? °After your procedure, it is common to have: °· Discomfort at the port insertion site. °· Bruising on the skin over the port. This should improve over 3-4 days. ° °Follow these instructions at home: °Port care °· After your port is placed, you will get a manufacturer's information card. The card has information about your port. Keep this card with you at all times. °· Take care of the port as told by your health care provider. Ask your health care provider if you or a family member can get training for taking care of the port at home. A home health care nurse may also take care of the port. °· Make sure to remember what type of port you have. °Incision care °· Follow instructions from your health care provider about how to take care of your port insertion site. Make sure you: °? Wash your hands with soap and water before you change your bandage (dressing). If soap and water are not available, use hand sanitizer. °? Change your dressing as told by your health care provider. °? Leave stitches (sutures), skin glue, or adhesive strips in place. These skin closures may need to stay in place for 2 weeks or longer. If adhesive strip edges start to loosen and curl up, you may trim the loose edges. Do not remove adhesive strips completely unless your health care provider tells you to do that. °· Check your port insertion site every day for signs of infection. Check for: °? More redness, swelling, or pain. °? More fluid or blood. °? Warmth. °? Pus or a bad smell. °General instructions °· Do not take baths, swim, or use a hot tub until your health care provider approves. °· Do not lift anything that is heavier than 10 lb (4.5  kg) for a week, or as told by your health care provider. °· Ask your health care provider when it is okay to: °? Return to work or school. °? Resume usual physical activities or sports. °· Do not drive for 24 hours if you were given a medicine to help you relax (sedative). °· Take over-the-counter and prescription medicines only as told by your health care provider. °· Wear a medical alert bracelet in case of an emergency. This will tell any health care providers that you have a port. °· Keep all follow-up visits as told by your health care provider. This is important. °Contact a health care provider if: °· You cannot flush your port with saline as directed, or you cannot draw blood from the port. °· You have a fever or chills. °· You have more redness, swelling, or pain around your port insertion site. °· You have more fluid or blood coming from your port insertion site. °· Your port insertion site feels warm to the touch. °· You have pus or a bad smell coming from the port insertion site. °Get help right away if: °· You have chest pain or shortness of breath. °· You have bleeding from your port that you cannot control. °Summary °· Take care of the port as told by your health care provider. °· Change your dressing as told by your health care provider. °· Keep all follow-up visits as told by your health care provider. °  This information is not intended to replace advice given to you by your health care provider. Make sure you discuss any questions you have with your health care provider. °Document Released: 09/08/2013 Document Revised: 10/09/2016 Document Reviewed: 10/09/2016 °Elsevier Interactive Patient Education © 2017 [employer] °Moderate Conscious Sedation, Adult, Care After °These instructions provide you with information about caring for yourself after your procedure. Your health care provider may also give you more specific instructions. Your treatment has been planned according to current medical  practices, but problems sometimes occur. Call your health care provider if you have any problems or questions after your procedure. °What can I expect after the procedure? °After your procedure, it is common: °· To feel sleepy for several hours. °· To feel clumsy and have poor balance for several hours. °· To have poor judgment for several hours. °· To vomit if you eat too soon. ° °Follow these instructions at home: °For at least 24 hours after the procedure: ° °· Do not: °? Participate in activities where you could fall or become injured. °? Drive. °? Use heavy machinery. °? Drink alcohol. °? Take sleeping pills or medicines that cause drowsiness. °? Make important decisions or sign legal documents. °? Take care of children on your own. °· Rest. °Eating and drinking °· Follow the diet recommended by your health care provider. °· If you vomit: °? Drink water, juice, or soup when you can drink without vomiting. °? Make sure you have little or no nausea before eating solid foods. °General instructions °· Have a responsible adult stay with you until you are awake and alert. °· Take over-the-counter and prescription medicines only as told by your health care provider. °· If you smoke, do not smoke without supervision. °· Keep all follow-up visits as told by your health care provider. This is important. °Contact a health care provider if: °· You keep feeling nauseous or you keep vomiting. °· You feel light-headed. °· You develop a rash. °· You have a fever. °Get help right away if: °· You have trouble breathing. °This information is not intended to replace advice given to you by your health care provider. Make sure you discuss any questions you have with your health care provider. °Document Released: 09/08/2013 Document Revised: 04/22/2016 Document Reviewed: 03/09/2016 °Elsevier Interactive Patient Education © 2018 [employer] ° °

## 2018-02-27 ENCOUNTER — Other Ambulatory Visit: Payer: Self-pay | Admitting: Oncology

## 2018-02-27 ENCOUNTER — Telehealth: Payer: Self-pay | Admitting: *Deleted

## 2018-02-27 MED ORDER — POTASSIUM CHLORIDE CRYS ER 20 MEQ PO TBCR: EXTENDED_RELEASE_TABLET | ORAL | 0 refills | Status: DC

## 2018-02-27 NOTE — Telephone Encounter (Signed)
K+ sent to pt pharmacy

## 2018-03-01 DIAGNOSIS — R0902 Hypoxemia: Secondary | ICD-10-CM | POA: Diagnosis not present

## 2018-03-01 DIAGNOSIS — J449 Chronic obstructive pulmonary disease, unspecified: Secondary | ICD-10-CM | POA: Diagnosis not present

## 2018-03-03 ENCOUNTER — Other Ambulatory Visit: Payer: Self-pay | Admitting: Radiology

## 2018-03-03 ENCOUNTER — Encounter (HOSPITAL_COMMUNITY)
Admission: RE | Admit: 2018-03-03 | Discharge: 2018-03-03 | Disposition: A | Discharge status: Home / Self Care | Payer: Medicare Other | Source: Ambulatory Visit | Attending: Internal Medicine | Admitting: Internal Medicine

## 2018-03-03 ENCOUNTER — Other Ambulatory Visit: Payer: Medicare Other

## 2018-03-03 DIAGNOSIS — C3492 Malignant neoplasm of unspecified part of left bronchus or lung: Secondary | ICD-10-CM | POA: Diagnosis present

## 2018-03-03 LAB — COMPREHENSIVE METABOLIC PANEL
ALT: 7 U/L — ABNORMAL LOW (ref 14–54)
ANION GAP: 12 (ref 5–15)
AST: 13 U/L — AB (ref 15–41)
Albumin: 3.6 g/dL (ref 3.5–5.0)
Alkaline Phosphatase: 61 U/L (ref 38–126)
BILIRUBIN TOTAL: 0.6 mg/dL (ref 0.3–1.2)
BUN: <5 mg/dL — ABNORMAL LOW (ref 6–20)
CHLORIDE: 102 mmol/L (ref 101–111)
CO2: 25 mmol/L (ref 22–32)
Calcium: 8 mg/dL — ABNORMAL LOW (ref 8.9–10.3)
Creatinine, Ser: 0.62 mg/dL (ref 0.44–1.00)
GFR calc Af Amer: >60 mL/min (ref >60)
GFR calc non Af Amer: >60 mL/min (ref >60)
Glucose, Bld: 97 mg/dL (ref 65–99)
POTASSIUM: 3.9 mmol/L (ref 3.5–5.1)
SODIUM: 139 mmol/L (ref 135–145)
TOTAL PROTEIN: 6.9 g/dL (ref 6.5–8.1)

## 2018-03-03 LAB — CBC WITH DIFFERENTIAL/PLATELET
BASOS ABS: 0 10*3/uL (ref 0.0–0.1)
Basophils Relative: 0 %
EOS PCT: 1 %
Eosinophils Absolute: 0 10*3/uL (ref 0.0–0.7)
HEMATOCRIT: 33.9 % — AB (ref 36.0–46.0)
HEMOGLOBIN: 11.8 g/dL — AB (ref 12.0–15.0)
LYMPHS ABS: 2 10*3/uL (ref 0.7–4.0)
LYMPHS PCT: 32 %
MCH: 31.6 pg (ref 26.0–34.0)
MCHC: 34.8 g/dL (ref 30.0–36.0)
MCV: 90.6 fL (ref 78.0–100.0)
MONO ABS: 0.8 10*3/uL (ref 0.1–1.0)
Monocytes Relative: 13 %
NEUTROS ABS: 3.4 10*3/uL (ref 1.7–7.7)
Neutrophils Relative %: 54 %
Platelets: 35 10*3/uL — ABNORMAL LOW (ref 150–400)
RBC: 3.74 MIL/uL — AB (ref 3.87–5.11)
RDW: 12.2 % (ref 11.5–15.5)
WBC: 6.3 10*3/uL (ref 4.0–10.5)

## 2018-03-04 ENCOUNTER — Other Ambulatory Visit: Payer: Self-pay | Admitting: Internal Medicine

## 2018-03-04 ENCOUNTER — Encounter (HOSPITAL_COMMUNITY): Payer: Self-pay

## 2018-03-04 ENCOUNTER — Ambulatory Visit (HOSPITAL_COMMUNITY)
Admission: RE | Admit: 2018-03-04 | Discharge: 2018-03-04 | Disposition: A | Discharge status: Home / Self Care | Payer: Medicare Other | Source: Ambulatory Visit | Attending: Internal Medicine | Admitting: Internal Medicine

## 2018-03-04 ENCOUNTER — Ambulatory Visit (HOSPITAL_COMMUNITY)
Admission: RE | Admit: 2018-03-04 | Discharge: 2018-03-04 | Disposition: A | Discharge status: Home / Self Care | Payer: Medicare Other | Source: Ambulatory Visit | Attending: Interventional Radiology | Admitting: Interventional Radiology

## 2018-03-04 DIAGNOSIS — Z5111 Encounter for antineoplastic chemotherapy: Secondary | ICD-10-CM | POA: Diagnosis not present

## 2018-03-04 DIAGNOSIS — K317 Polyp of stomach and duodenum: Secondary | ICD-10-CM | POA: Diagnosis not present

## 2018-03-04 DIAGNOSIS — Z9049 Acquired absence of other specified parts of digestive tract: Secondary | ICD-10-CM | POA: Insufficient documentation

## 2018-03-04 DIAGNOSIS — M1990 Unspecified osteoarthritis, unspecified site: Secondary | ICD-10-CM | POA: Diagnosis not present

## 2018-03-04 DIAGNOSIS — I10 Essential (primary) hypertension: Secondary | ICD-10-CM | POA: Insufficient documentation

## 2018-03-04 DIAGNOSIS — Z9221 Personal history of antineoplastic chemotherapy: Secondary | ICD-10-CM | POA: Diagnosis not present

## 2018-03-04 DIAGNOSIS — Z86010 Personal history of colonic polyps: Secondary | ICD-10-CM | POA: Insufficient documentation

## 2018-03-04 DIAGNOSIS — M797 Fibromyalgia: Secondary | ICD-10-CM | POA: Diagnosis not present

## 2018-03-04 DIAGNOSIS — C787 Secondary malignant neoplasm of liver and intrahepatic bile duct: Secondary | ICD-10-CM | POA: Diagnosis not present

## 2018-03-04 DIAGNOSIS — J449 Chronic obstructive pulmonary disease, unspecified: Secondary | ICD-10-CM | POA: Diagnosis not present

## 2018-03-04 DIAGNOSIS — C3492 Malignant neoplasm of unspecified part of left bronchus or lung: Secondary | ICD-10-CM | POA: Insufficient documentation

## 2018-03-04 DIAGNOSIS — E039 Hypothyroidism, unspecified: Secondary | ICD-10-CM | POA: Diagnosis not present

## 2018-03-04 DIAGNOSIS — Z923 Personal history of irradiation: Secondary | ICD-10-CM | POA: Diagnosis not present

## 2018-03-04 DIAGNOSIS — K219 Gastro-esophageal reflux disease without esophagitis: Secondary | ICD-10-CM | POA: Diagnosis not present

## 2018-03-04 DIAGNOSIS — K589 Irritable bowel syndrome without diarrhea: Secondary | ICD-10-CM | POA: Insufficient documentation

## 2018-03-04 DIAGNOSIS — Z881 Allergy status to other antibiotic agents status: Secondary | ICD-10-CM | POA: Diagnosis not present

## 2018-03-04 DIAGNOSIS — R05 Cough: Secondary | ICD-10-CM | POA: Insufficient documentation

## 2018-03-04 DIAGNOSIS — Z880 Allergy status to penicillin: Secondary | ICD-10-CM | POA: Diagnosis not present

## 2018-03-04 DIAGNOSIS — F419 Anxiety disorder, unspecified: Secondary | ICD-10-CM | POA: Insufficient documentation

## 2018-03-04 DIAGNOSIS — Z885 Allergy status to narcotic agent status: Secondary | ICD-10-CM | POA: Insufficient documentation

## 2018-03-04 DIAGNOSIS — Z981 Arthrodesis status: Secondary | ICD-10-CM | POA: Diagnosis not present

## 2018-03-04 DIAGNOSIS — R0600 Dyspnea, unspecified: Secondary | ICD-10-CM | POA: Diagnosis not present

## 2018-03-04 DIAGNOSIS — C3490 Malignant neoplasm of unspecified part of unspecified bronchus or lung: Secondary | ICD-10-CM | POA: Diagnosis not present

## 2018-03-04 DIAGNOSIS — D696 Thrombocytopenia, unspecified: Secondary | ICD-10-CM | POA: Diagnosis not present

## 2018-03-04 DIAGNOSIS — Z79899 Other long term (current) drug therapy: Secondary | ICD-10-CM | POA: Diagnosis not present

## 2018-03-04 DIAGNOSIS — I878 Other specified disorders of veins: Secondary | ICD-10-CM

## 2018-03-04 DIAGNOSIS — Z7951 Long term (current) use of inhaled steroids: Secondary | ICD-10-CM | POA: Insufficient documentation

## 2018-03-04 HISTORY — PX: IR FLUORO GUIDE PORT INSERTION RIGHT: IMG5741

## 2018-03-04 HISTORY — PX: IR US GUIDE VASC ACCESS RIGHT: IMG2390

## 2018-03-04 LAB — CBC
HCT: 31.3 % — ABNORMAL LOW (ref 36.0–46.0)
HEMOGLOBIN: 11 g/dL — AB (ref 12.0–15.0)
MCH: 32.1 pg (ref 26.0–34.0)
MCHC: 35.1 g/dL (ref 30.0–36.0)
MCV: 91.3 fL (ref 78.0–100.0)
Platelets: 41 10*3/uL — ABNORMAL LOW (ref 150–400)
RBC: 3.43 MIL/uL — ABNORMAL LOW (ref 3.87–5.11)
RDW: 12.5 % (ref 11.5–15.5)
WBC: 6.8 10*3/uL (ref 4.0–10.5)

## 2018-03-04 LAB — APTT: aPTT: 28 seconds (ref 24–36)

## 2018-03-04 LAB — PROTIME-INR
INR: 0.93
Prothrombin Time: 12.3 seconds (ref 11.4–15.2)

## 2018-03-04 MED ORDER — HYDROMORPHONE HCL 1 MG/ML IJ SOLN
INTRAMUSCULAR | Status: AC
  Filled 2018-03-04: qty 1

## 2018-03-04 MED ORDER — VANCOMYCIN HCL IN DEXTROSE 1-5 GM/200ML-% IV SOLN
INTRAVENOUS | Status: AC
  Filled 2018-03-04: qty 200

## 2018-03-04 MED ORDER — LIDOCAINE HCL 1 % IJ SOLN
INTRAMUSCULAR | Status: AC
  Filled 2018-03-04: qty 20

## 2018-03-04 MED ORDER — MIDAZOLAM HCL 2 MG/2ML IJ SOLN
INTRAMUSCULAR | Status: AC
  Filled 2018-03-04: qty 4

## 2018-03-04 MED ORDER — SODIUM CHLORIDE 0.9 % IV SOLN
Freq: Once | INTRAVENOUS | Status: AC
  Administered 2018-03-04: 08:00:00 via INTRAVENOUS

## 2018-03-04 MED ORDER — HYDROMORPHONE HCL 1 MG/ML IJ SOLN
INTRAMUSCULAR | Status: AC | PRN
  Administered 2018-03-04 (×2): 0.5 mg via INTRAVENOUS

## 2018-03-04 MED ORDER — MIDAZOLAM HCL 2 MG/2ML IJ SOLN
INTRAMUSCULAR | Status: AC | PRN
  Administered 2018-03-04 (×4): 1 mg via INTRAVENOUS

## 2018-03-04 MED ORDER — LIDOCAINE HCL (PF) 1 % IJ SOLN
INTRAMUSCULAR | Status: AC | PRN
  Administered 2018-03-04: 5 mL

## 2018-03-04 MED ORDER — HEPARIN SOD (PORK) LOCK FLUSH 100 UNIT/ML IV SOLN
INTRAVENOUS | Status: AC | PRN
  Administered 2018-03-04: 500 [IU] via INTRAVENOUS

## 2018-03-04 MED ORDER — VANCOMYCIN HCL IN DEXTROSE 1-5 GM/200ML-% IV SOLN
1000.0000 mg | Freq: Once | INTRAVENOUS | Status: AC
  Administered 2018-03-04: 1000 mg via INTRAVENOUS

## 2018-03-04 MED ORDER — HEPARIN SOD (PORK) LOCK FLUSH 100 UNIT/ML IV SOLN
INTRAVENOUS | Status: AC
  Filled 2018-03-04: qty 5

## 2018-03-04 NOTE — Procedures (Signed)
Interventional Radiology Procedure Note  Procedure: Single Lumen Power Port Placement    Access:  Right IJ vein.  Findings: Catheter tip positioned at SVC/RA junction. Port is ready for immediate use.   Complications: None  EBL: < 10 mL  Recommendations:  - Ok to shower in 24 hours - Do not submerge for 7 days - Routine line care   Fabiha Rougeau T. Ganesh Deeg, M.D Pager:  [phone]   

## 2018-03-04 NOTE — Discharge Instructions (Signed)
Implanted Port Home Guide °An implanted port is a type of central line that is placed under the skin. Central lines are used to provide IV access when treatment or nutrition needs to be given through a person’s veins. Implanted ports are used for long-term IV access. An implanted port may be placed because: °· You need IV medicine that would be irritating to the small veins in your hands or arms. °· You need long-term IV medicines, such as antibiotics. °· You need IV nutrition for a long period. °· You need frequent blood draws for lab tests. °· You need dialysis. ° °Implanted ports are usually placed in the chest area, but they can also be placed in the upper arm, the abdomen, or the leg. An implanted port has two main parts: °· Reservoir. The reservoir is round and will appear as a small, raised area under your skin. The reservoir is the part where a needle is inserted to give medicines or draw blood. °· Catheter. The catheter is a thin, flexible tube that extends from the reservoir. The catheter is placed into a large vein. Medicine that is inserted into the reservoir goes into the catheter and then into the vein. ° °How will I care for my incision site? °Do not get the incision site wet. Bathe or shower as directed by your health care provider. °How is my port accessed? °Special steps must be taken to access the port: °· Before the port is accessed, a numbing cream can be placed on the skin. This helps numb the skin over the port site. °· Your health care provider uses a sterile technique to access the port. °? Your health care provider must put on a mask and sterile gloves. °? The skin over your port is cleaned carefully with an antiseptic and allowed to dry. °? The port is gently pinched between sterile gloves, and a needle is inserted into the port. °· Only "non-coring" port needles should be used to access the port. Once the port is accessed, a blood return should be checked. This helps ensure that the port  is in the vein and is not clogged. °· If your port needs to remain accessed for a constant infusion, a clear (transparent) bandage will be placed over the needle site. The bandage and needle will need to be changed every week, or as directed by your health care provider. °· Keep the bandage covering the needle clean and dry. Do not get it wet. Follow your health care provider’s instructions on how to take a shower or bath while the port is accessed. °· If your port does not need to stay accessed, no bandage is needed over the port. ° °What is flushing? °Flushing helps keep the port from getting clogged. Follow your health care provider’s instructions on how and when to flush the port. Ports are usually flushed with saline solution or a medicine called heparin. The need for flushing will depend on how the port is used. °· If the port is used for intermittent medicines or blood draws, the port will need to be flushed: °? After medicines have been given. °? After blood has been drawn. °? As part of routine maintenance. °· If a constant infusion is running, the port may not need to be flushed. ° °How long will my port stay implanted? °The port can stay in for as long as your health care provider thinks it is needed. When it is time for the port to come out, surgery will be   done to remove it. The procedure is similar to the one performed when the port was put in. When should I seek immediate medical care? When you have an implanted port, you should seek immediate medical care if:  You notice a bad smell coming from the incision site.  You have swelling, redness, or drainage at the incision site.  You have more swelling or pain at the port site or the surrounding area.  You have a fever that is not controlled with medicine.  This information is not intended to replace advice given to you by your health care provider. Make sure you discuss any questions you have with your health care provider. Document  Released: 11/18/2005 Document Revised: 04/25/2016 Document Reviewed: 07/26/2013 Elsevier Interactive Patient Education  2017 [place]. Moderate Conscious Sedation, Adult, Care After These instructions provide you with information about caring for yourself after your procedure. Your health care provider may also give you more specific instructions. Your treatment has been planned according to current medical practices, but problems sometimes occur. Call your health care provider if you have any problems or questions after your procedure. What can I expect after the procedure? After your procedure, it is common:  To feel sleepy for several hours.  To feel clumsy and have poor balance for several hours.  To have poor judgment for several hours.  To vomit if you eat too soon.  Follow these instructions at home: For at least 24 hours after the procedure:   Do not: ? Participate in activities where you could fall or become injured. ? Drive. ? Use heavy machinery. ? Drink alcohol. ? Take sleeping pills or medicines that cause drowsiness. ? Make important decisions or sign legal documents. ? Take care of children on your own.  Rest. Eating and drinking  Follow the diet recommended by your health care provider.  If you vomit: ? Drink water, juice, or soup when you can drink without vomiting. ? Make sure you have little or no nausea before eating solid foods. General instructions  Have a responsible adult stay with you until you are awake and alert.  Take over-the-counter and prescription medicines only as told by your health care provider.  If you smoke, do not smoke without supervision.  Keep all follow-up visits as told by your health care provider. This is important. Contact a health care provider if:  You keep feeling nauseous or you keep vomiting.  You feel light-headed.  You develop a rash.  You have a fever. Get help right away if:  You have trouble  breathing. This information is not intended to replace advice given to you by your health care provider. Make sure you discuss any questions you have with your health care provider. Document Released: 09/08/2013 Document Revised: 04/22/2016 Document Reviewed: 03/09/2016 Elsevier Interactive Patient Education  [employer].

## 2018-03-04 NOTE — Progress Notes (Signed)
Referring Physician(s): Mohamed,Mohamed  Supervising Physician: Aletta Edouard  Patient Status:  Rebekah Henderson OP  Chief Complaint: "I'm here again for my port a cath"   Subjective: Patient familiar to IR service from prior liver lesion biopsy on 02/06/18.  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  She now has evidence of recurrent disease on imaging/positive liver biospy and presents again today for Port-A-Cath placement for additional chemotherapy.  She initially presented for Port-A-Cath placement on 3/28 however due to significant neutropenia and thrombocytopenia placement was postponed.  WBC today is 6.8 and platelets are 41k.  She currently denies fever, chest pain, vomiting or abnormal bleeding.  She does have some occasional cough and dyspnea with exertion, back pain as well as some intermittent nausea. Past Medical History:  Diagnosis Date  . 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 Anxiety    takes Prozac daily  . Arthritis   . Benign fundic gland polyps of stomach   . Colon polyps   . COPD (chronic obstructive pulmonary disease) ([place])   . Dehydration 03/06/2017  . Diverticulitis   . Dyspnea    with exertion  . Encounter for antineoplastic chemotherapy 12/03/2016  . Fibromyalgia   . GERD (gastroesophageal reflux disease)    takes Pantoprazole daily  . Hypertension    takes Metoprolol,Triamterene-HCTZ,and Amlodipine daily  . Hypothyroidism    takes Synthroid daily  . IBS (irritable bowel syndrome)   . lung ca dx'd 10/02/2016   skin, lung  . PONV (postoperative nausea and vomiting)    pt also states that she had some difficulty breathing after cervical fusion   Past Surgical History:  Procedure Laterality Date  . BIOPSY N/A 05/25/2013   Procedure: BIOPSIES (Random Colon; Duodenal; Gastric);  Surgeon: Danie Binder, MD;  Location: [facility];  Service: Endoscopy;  Laterality: N/A;  . BLADDER  SUSPENSION    . BREAST ENHANCEMENT SURGERY    . BREAST IMPLANT REMOVAL    . CERVICAL FUSION  AUG 2013  . CHOLECYSTECTOMY  1999  . COLONOSCOPY  2007 [place]   POLYPS  . COLONOSCOPY WITH PROPOFOL N/A 05/25/2013   Procedure: COLONOSCOPY WITH PROPOFOL(at cecum 0957) total withdrawal time=49min);  Surgeon: Danie Binder, MD;  Location: [facility];  Service: Endoscopy;  Laterality: N/A;  . ESOPHAGOGASTRODUODENOSCOPY (EGD) WITH PROPOFOL N/A 05/25/2013   Procedure: ESOPHAGOGASTRODUODENOSCOPY (EGD) WITH PROPOFOL;  Surgeon: Danie Binder, MD;  Location: [facility];  Service: Endoscopy;  Laterality: N/A;  . FOOT SURGERY    . POLYPECTOMY N/A 05/25/2013   Procedure: POLYPECTOMY (Rectal and Gastric);  Surgeon: Danie Binder, MD;  Location: [facility];  Service: Endoscopy;  Laterality: N/A;  . TONSILLECTOMY    . UPPER GASTROINTESTINAL ENDOSCOPY    . VIDEO BRONCHOSCOPY WITH ENDOBRONCHIAL ULTRASOUND  09/12/2016   Procedure: VIDEO BRONCHOSCOPY WITH ENDOBRONCHIAL ULTRASOUND AND BIOPSY;  Surgeon: Juanito Doom, MD;  Location: [facility];  Service: Cardiopulmonary;;       Allergies: Penicillins; Codeine; Fentanyl; Ranitidine hcl; Keflex [cephalexin]; and Lyrica [pregabalin]  Medications: Prior to Admission medications   Medication Sig Start Date End Date Taking? Authorizing Provider  albuterol (PROVENTIL HFA;VENTOLIN HFA) 108 (90 Base) MCG/ACT inhaler Inhale 2 puffs into the lungs every 4 (four) hours as needed for wheezing or shortness of breath.   Yes [provider]  ALPRAZolam (XANAX) 0.25 MG tablet Take 2 tablets (0.5 mg total) by mouth at bedtime as needed for anxiety. 02/11/18  Yes Curt Bears, MD  amLODipine (NORVASC) 5 MG tablet Take 5 mg by mouth daily.     Yes [provider]  dicyclomine (BENTYL) 20 MG tablet Take 20 mg by mouth 3 (three) times daily as needed for spasms.   Yes [provider]  estazolam (PROSOM) 2 MG tablet Take 2 mg by mouth at bedtime.   Yes [provider]  estradiol (ESTRACE) 2 MG tablet Take 2 mg by mouth daily.     Yes [provider]  FLUoxetine (PROZAC) 40 MG capsule Take 40 mg by mouth daily.    Yes [provider]  HYDROcodone-acetaminophen (NORCO/VICODIN) 5-325 MG tablet Take 1 tablet by mouth every 4 (four) hours as needed for moderate pain.    Yes [provider]  levothyroxine (SYNTHROID, LEVOTHROID) 50 MCG tablet Take 50 mcg by mouth daily before breakfast.    Yes [provider]  loperamide (IMODIUM) 2 MG capsule Take 2 mg by mouth every 2 (two) hours as needed for diarrhea or loose stools.   Yes [provider]  metoprolol succinate (TOPROL-XL) 25 MG 24 hr tablet Take 25 mg by mouth daily.   Yes [provider]  ondansetron (ZOFRAN-ODT) 4 MG disintegrating tablet DISSOLVE 1 TABLET BY MOUTH EVERY 8 HOURS AS NEEDED FOR NAUSEA AND VOMITING. 02/05/18  Yes Curt Bears, MD  pantoprazole (PROTONIX) 40 MG tablet Take 40 mg by mouth 2 (two) times daily before a meal.    Yes [provider]  potassium chloride SA (K-DUR,KLOR-CON) 20 MEQ tablet Take 1 tablet twice a day for 7 days then 1 tablet daily. 02/27/18  Yes Curcio, Roselie Awkward, NP  promethazine (PHENERGAN) 25 MG tablet Take 1 tablet (25 mg total) by mouth every 6 (six) hours as needed. 02/11/18  Yes Curt Bears, MD  lidocaine-prilocaine (EMLA) cream Apply 1 application topically as needed. To numb skin over port a cath: Squeeze a  small amount on cotton ball and place over port site 1-2 hours prior to chemotherapy. 02/18/18   Curt Bears, MD  senna-docusate (SENOKOT-S) 8.6-50 MG tablet Take 1 tablet by mouth daily as needed. 07/17/12   [provider]  triamterene-hydrochlorothiazide (DYAZIDE) 37.5-25 MG capsule Take 1 capsule by mouth daily.    [provider]     Vital Signs: BP 119/80   Pulse 74   Temp 97.6 F (36.4 C) (Oral)   Resp 16   Ht 5\' 4"  (1.626 m)   Wt 165 lb 14.4 oz  (75.3 kg)   SpO2 95%   BMI 28.48 kg/m   Physical Exam awake, alert; chest clear to auscultation bilaterally.  Heart with regular rate and rhythm.  Abdomen soft, positive bowel sounds, nontender.  No significant lower extremity edema.  Imaging: No results found.  Labs:  CBC: Recent Labs    02/23/18 1048 02/26/18 0744 03/03/18 1031 03/04/18 0718  WBC 7.7 1.1* 6.3 6.8  HGB 14.1 12.8 11.8* 11.0*  HCT 41.0 37.7 33.9* 31.3*  PLT 114* 42* 35* 41*    COAGS: Recent Labs    02/06/18 1216 02/26/18 0744 03/04/18 0718  INR 0.96 0.90 0.93  APTT 29  --  28    BMP: Recent Labs    02/17/18 0814 02/23/18 1048 02/26/18 0744 03/03/18 1031  NA 141 138 140 139  K 3.6 3.3* 2.7* 3.9  CL 104 97* 99* 102  CO2 27 28 29 25   GLUCOSE 85 103* 97 97  BUN 7 13 12  <5*  CALCIUM  8.9 8.6* 8.4* 8.0*  CREATININE 0.71 0.68 0.65 0.62  GFRNONAA >60 >60 >60 >60  GFRAA >60 >60 >60 >60    LIVER FUNCTION TESTS: Recent Labs    01/30/18 0757 02/17/18 0814 02/23/18 1048 03/03/18 1031  BILITOT 0.4 0.7 1.2 0.6  AST 9 11 14* 13*  ALT <6 <6 10* 7*  ALKPHOS 47 47 62 61  PROT 6.6 7.0 7.0 6.9  ALBUMIN 3.5 3.7 3.8 3.6    Assessment and Plan: Patient with history of recurrent metastatic small cell left lung cancer; presents today for Port-A-Cath placement for chemotherapy. Risks and benefits of image guided port-a-catheter placement was discussed with the patient/son including, but not limited to bleeding, infection, pneumothorax, or fibrin sheath development and need for additional procedures.  All of the patient's questions were answered, patient is agreeable to proceed. Consent signed and in chart.  WBC today 6.8, platelets 41k; above discussed with Dr. Kathlene Cote and plans are to proceed with port placement.   Electronically Signed: D. Rowe Robert, PA-C 03/04/2018, 8:38 AM   I spent a total of 20 minutes at the the patient's bedside AND on the patient's hospital floor or unit, greater than  50% of which was counseling/coordinating care for Port-A-Cath placement    Patient ID: Rebekah Henderson, female   DOB: 1956/06/06, 62 y.o.   MRN: [id number]

## 2018-03-10 ENCOUNTER — Encounter: Payer: Self-pay | Admitting: Oncology

## 2018-03-10 ENCOUNTER — Inpatient Hospital Stay: Payer: Medicare Other | Attending: Internal Medicine | Admitting: Oncology

## 2018-03-10 ENCOUNTER — Inpatient Hospital Stay: Payer: Medicare Other

## 2018-03-10 ENCOUNTER — Telehealth: Payer: Self-pay | Admitting: Oncology

## 2018-03-10 VITALS — BP 128/74 | HR 81 | Temp 97.9°F | Resp 18 | Ht 64.0 in | Wt 166.3 lb

## 2018-03-10 DIAGNOSIS — R5382 Chronic fatigue, unspecified: Secondary | ICD-10-CM

## 2018-03-10 DIAGNOSIS — I10 Essential (primary) hypertension: Secondary | ICD-10-CM | POA: Insufficient documentation

## 2018-03-10 DIAGNOSIS — C3412 Malignant neoplasm of upper lobe, left bronchus or lung: Secondary | ICD-10-CM | POA: Diagnosis not present

## 2018-03-10 DIAGNOSIS — Z5189 Encounter for other specified aftercare: Secondary | ICD-10-CM | POA: Diagnosis not present

## 2018-03-10 DIAGNOSIS — Z79899 Other long term (current) drug therapy: Secondary | ICD-10-CM | POA: Insufficient documentation

## 2018-03-10 DIAGNOSIS — F419 Anxiety disorder, unspecified: Secondary | ICD-10-CM | POA: Diagnosis not present

## 2018-03-10 DIAGNOSIS — R110 Nausea: Secondary | ICD-10-CM | POA: Diagnosis not present

## 2018-03-10 DIAGNOSIS — C3432 Malignant neoplasm of lower lobe, left bronchus or lung: Secondary | ICD-10-CM | POA: Diagnosis present

## 2018-03-10 DIAGNOSIS — C3492 Malignant neoplasm of unspecified part of left bronchus or lung: Secondary | ICD-10-CM

## 2018-03-10 DIAGNOSIS — J449 Chronic obstructive pulmonary disease, unspecified: Secondary | ICD-10-CM | POA: Insufficient documentation

## 2018-03-10 DIAGNOSIS — E039 Hypothyroidism, unspecified: Secondary | ICD-10-CM | POA: Diagnosis not present

## 2018-03-10 DIAGNOSIS — R112 Nausea with vomiting, unspecified: Secondary | ICD-10-CM

## 2018-03-10 DIAGNOSIS — Z5111 Encounter for antineoplastic chemotherapy: Secondary | ICD-10-CM

## 2018-03-10 DIAGNOSIS — Z5112 Encounter for antineoplastic immunotherapy: Secondary | ICD-10-CM | POA: Diagnosis not present

## 2018-03-10 LAB — CMP (CANCER CENTER ONLY)
ALT: 6 U/L (ref 0–55)
AST: 11 U/L (ref 5–34)
Albumin: 3.5 g/dL (ref 3.5–5.0)
Alkaline Phosphatase: 63 U/L (ref 40–150)
Anion gap: 10 (ref 3–11)
BUN: 5 mg/dL — AB (ref 7–26)
CHLORIDE: 107 mmol/L (ref 98–109)
CO2: 24 mmol/L (ref 22–29)
CREATININE: 0.73 mg/dL (ref 0.60–1.10)
Calcium: 8.5 mg/dL (ref 8.4–10.4)
GFR, Estimated: >60 mL/min (ref >60)
GLUCOSE: 89 mg/dL (ref 70–140)
Potassium: 4.3 mmol/L (ref 3.5–5.1)
SODIUM: 141 mmol/L (ref 136–145)
Total Bilirubin: 0.5 mg/dL (ref 0.2–1.2)
Total Protein: 6.6 g/dL (ref 6.4–8.3)

## 2018-03-10 LAB — CBC WITH DIFFERENTIAL (CANCER CENTER ONLY)
BASOS ABS: 0 10*3/uL (ref 0.0–0.1)
Basophils Relative: 0 %
EOS ABS: 0 10*3/uL (ref 0.0–0.5)
EOS PCT: 0 %
HCT: 34.6 % — ABNORMAL LOW (ref 34.8–46.6)
Hemoglobin: 11.8 g/dL (ref 11.6–15.9)
Lymphocytes Relative: 18 %
Lymphs Abs: 1.3 10*3/uL (ref 0.9–3.3)
MCH: 32.1 pg (ref 25.1–34.0)
MCHC: 34.1 g/dL (ref 31.5–36.0)
MCV: 94 fL (ref 79.5–101.0)
Monocytes Absolute: 0.7 10*3/uL (ref 0.1–0.9)
Monocytes Relative: 9 %
NEUTROS PCT: 73 %
Neutro Abs: 5.3 10*3/uL (ref 1.5–6.5)
PLATELETS: 195 10*3/uL (ref 145–400)
RBC: 3.68 MIL/uL — AB (ref 3.70–5.45)
RDW: 14.2 % (ref 11.2–14.5)
WBC: 7.3 10*3/uL (ref 3.9–10.3)

## 2018-03-10 LAB — TSH: TSH: 0.535 u[IU]/mL (ref 0.308–3.960)

## 2018-03-10 MED ORDER — PALONOSETRON HCL INJECTION 0.25 MG/5ML
INTRAVENOUS | Status: AC
  Filled 2018-03-10: qty 5

## 2018-03-10 MED ORDER — SODIUM CHLORIDE 0.9 % IV SOLN
1200.0000 mg | Freq: Once | INTRAVENOUS | Status: AC
  Administered 2018-03-10: 1200 mg via INTRAVENOUS
  Filled 2018-03-10: qty 20

## 2018-03-10 MED ORDER — FAMOTIDINE IN NACL 20-0.9 MG/50ML-% IV SOLN
20.0000 mg | Freq: Once | INTRAVENOUS | Status: AC
Start: 2018-03-10 — End: 2018-03-10
  Administered 2018-03-10: 20 mg via INTRAVENOUS

## 2018-03-10 MED ORDER — DEXAMETHASONE SODIUM PHOSPHATE 10 MG/ML IJ SOLN
INTRAMUSCULAR | Status: AC
  Filled 2018-03-10: qty 1

## 2018-03-10 MED ORDER — DIPHENHYDRAMINE HCL 50 MG/ML IJ SOLN
25.0000 mg | Freq: Once | INTRAMUSCULAR | Status: AC
  Administered 2018-03-10: 25 mg via INTRAVENOUS

## 2018-03-10 MED ORDER — ONDANSETRON 4 MG PO TBDP: ORAL_TABLET | ORAL | 1 refills | Status: DC

## 2018-03-10 MED ORDER — DIPHENHYDRAMINE HCL 50 MG/ML IJ SOLN
INTRAMUSCULAR | Status: AC
  Filled 2018-03-10: qty 1

## 2018-03-10 MED ORDER — SODIUM CHLORIDE 0.9% FLUSH
10.0000 mL | INTRAVENOUS | Status: DC | PRN
  Administered 2018-03-10: 10 mL
  Filled 2018-03-10: qty 10

## 2018-03-10 MED ORDER — DEXAMETHASONE SODIUM PHOSPHATE 10 MG/ML IJ SOLN
10.0000 mg | Freq: Once | INTRAMUSCULAR | Status: AC
  Administered 2018-03-10: 10 mg via INTRAVENOUS

## 2018-03-10 MED ORDER — SODIUM CHLORIDE 0.9 % IV SOLN
547.5000 mg | Freq: Once | INTRAVENOUS | Status: AC
  Administered 2018-03-10: 550 mg via INTRAVENOUS
  Filled 2018-03-10: qty 55

## 2018-03-10 MED ORDER — PALONOSETRON HCL INJECTION 0.25 MG/5ML
0.2500 mg | Freq: Once | INTRAVENOUS | Status: AC
Start: 2018-03-10 — End: 2018-03-10
  Administered 2018-03-10: 0.25 mg via INTRAVENOUS

## 2018-03-10 MED ORDER — HEPARIN SOD (PORK) LOCK FLUSH 100 UNIT/ML IV SOLN
500.0000 [IU] | Freq: Once | INTRAVENOUS | Status: AC | PRN
  Administered 2018-03-10: 500 [IU]
  Filled 2018-03-10: qty 5

## 2018-03-10 MED ORDER — FAMOTIDINE IN NACL 20-0.9 MG/50ML-% IV SOLN
INTRAVENOUS | Status: AC
  Filled 2018-03-10: qty 50

## 2018-03-10 MED ORDER — SODIUM CHLORIDE 0.9 % IV SOLN
Freq: Once | INTRAVENOUS | Status: AC
  Administered 2018-03-10: 14:00:00 via INTRAVENOUS

## 2018-03-10 MED ORDER — SODIUM CHLORIDE 0.9 % IV SOLN
100.0000 mg/m2 | Freq: Once | INTRAVENOUS | Status: AC
  Administered 2018-03-10: 180 mg via INTRAVENOUS
  Filled 2018-03-10: qty 9

## 2018-03-10 NOTE — Assessment & Plan Note (Signed)
This is a very pleasant 62 year old white female was limited stage small cell lung cancer, status post 6 cycles of systemic chemotherapy with carboplatin and etoposide concurrent with radiation and followed by prophylactic cranial irradiation. Patient has been in observation for more than a year.  She has been doing fine but recently started complaining of increasing fatigue and weakness as well as headache. Repeat CT scan of the chest showed concerning findings for disease recurrence in the lung as well as new lesion in the liver. PET scan which was performed recently and unfortunately showed hypermetabolic activity in the mediastinal lymph nodes in addition to the metastatic disease to the liver and bone including L3 bone lesion. Ultrasound-guided core biopsy of one of the suspicious liver lesion was consistent with recurrent small cell carcinoma. The patient is now on palliative systemic chemotherapy with carboplatin for AUC of 5 on day 1, etoposide 100 mg/M2 on days 1, 2 and 3 in addition to Tecentriq (Atezolizumab) 1200 mg IV every 3 weeks with Neulasta support.  She is status post 1 cycle.  She tolerated the first cycle well overall with the exception of intermittent nausea, vomiting, diarrhea.  Symptoms were controlled with oral medications.  Recommend for the patient to proceed with cycle 2 of her treatment as scheduled today.  We will have weekly labs closer to home at [facility]. The patient will follow up with [place] in approximately 3 weeks for evaluation prior to cycle #3 of her treatment.  Plan is for restaging scans after 3 cycles.  For anxiety, she may continue Xanax.  For nausea, she may continue Zofran and Phenergan.  I have refilled her Zofran today.   The patient voices understanding of current disease status and treatment options and is in agreement with the current care plan. All questions were answered. The patient knows to call the clinic with any problems, questions or  concerns. We can certainly see the patient much sooner if necessary.

## 2018-03-10 NOTE — Patient Instructions (Signed)
[facility] Discharge Instructions for Patients Receiving Chemotherapy  Today you received the following chemotherapy agents: Tecentriq, Etoposide and Carboplatin.  To help prevent nausea and vomiting after your treatment, we encourage you to take your nausea medication as prescribed.   If you develop nausea and vomiting that is not controlled by your nausea medication, call the clinic.   BELOW ARE SYMPTOMS THAT SHOULD BE REPORTED IMMEDIATELY:  *FEVER GREATER THAN 100.5 F  *CHILLS WITH OR WITHOUT FEVER  NAUSEA AND VOMITING THAT IS NOT CONTROLLED WITH YOUR NAUSEA MEDICATION  *UNUSUAL SHORTNESS OF BREATH  *UNUSUAL BRUISING OR BLEEDING  TENDERNESS IN MOUTH AND THROAT WITH OR WITHOUT PRESENCE OF ULCERS  *URINARY PROBLEMS  *BOWEL PROBLEMS  UNUSUAL RASH Items with * indicate a potential emergency and should be followed up as soon as possible.  Feel free to call the clinic should you have any questions or concerns. The clinic phone number is [phone].  Please show the [facility] at check-in to the Emergency Department and triage nurse.  Atezolizumab / Tecentriq injection What is this medicine? ATEZOLIZUMAB (a te zoe LIZ ue mab) is a monoclonal antibody. It is used to treat bladder cancer (urothelial cancer) and non-small cell lung cancer. This medicine may be used for other purposes; ask your health care provider or pharmacist if you have questions. COMMON BRAND NAME(S): Tecentriq What should I tell my health care provider before I take this medicine? They need to know if you have any of these conditions: -diabetes -immune system problems -infection -inflammatory bowel disease -liver disease -lung or breathing disease -lupus -nervous system problems like myasthenia gravis or Guillain-Barre syndrome -organ transplant -an unusual or allergic reaction to atezolizumab, other medicines, foods, dyes, or preservatives -pregnant or trying to get  pregnant -breast-feeding How should I use this medicine? This medicine is for infusion into a vein. It is given by a health care professional in a hospital or clinic setting. A special MedGuide will be given to you before each treatment. Be sure to read this information carefully each time. Talk to your pediatrician regarding the use of this medicine in children. Special care may be needed. Overdosage: If you think you have taken too much of this medicine contact a poison control center or emergency room at once. NOTE: This medicine is only for you. Do not share this medicine with others. What if I miss a dose? It is important not to miss your dose. Call your doctor or health care professional if you are unable to keep an appointment. What may interact with this medicine? Interactions have not been studied. This list may not describe all possible interactions. Give your health care provider a list of all the medicines, herbs, non-prescription drugs, or dietary supplements you use. Also tell them if you smoke, drink alcohol, or use illegal drugs. Some items may interact with your medicine. What should I watch for while using this medicine? Your condition will be monitored carefully while you are receiving this medicine. You may need blood work done while you are taking this medicine. Do not become pregnant while taking this medicine or for at least 5 months after stopping it. Women should inform their doctor if they wish to become pregnant or think they might be pregnant. There is a potential for serious side effects to an unborn child. Talk to your health care professional or pharmacist for more information. Do not breast-feed an infant while taking this medicine or for at least 5 months after  the last dose. What side effects may I notice from receiving this medicine? Side effects that you should report to your doctor or health care professional as soon as possible: -allergic reactions like skin  rash, itching or hives, swelling of the face, lips, or tongue -black, tarry stools -bloody or watery diarrhea -breathing problems -changes in vision -chest pain or chest tightness -chills -facial flushing -fever -headache -signs and symptoms of high blood sugar such as dizziness; dry mouth; dry skin; fruity breath; nausea; stomach pain; increased hunger or thirst; increased urination -signs and symptoms of liver injury like dark yellow or brown urine; general ill feeling or flu-like symptoms; light-colored stools; loss of appetite; nausea; right upper belly pain; unusually weak or tired; yellowing of the eyes or skin -stomach pain -trouble passing urine or change in the amount of urine Side effects that usually do not require medical attention (report to your doctor or health care professional if they continue or are bothersome): -cough -diarrhea -joint pain -muscle pain -muscle weakness -tiredness -weight loss This list may not describe all possible side effects. Call your doctor for medical advice about side effects. You may report side effects to FDA at 1-800-FDA-1088. Where should I keep my medicine? This drug is given in a hospital or clinic and will not be stored at home. NOTE: This sheet is a summary. It may not cover all possible information. If you have questions about this medicine, talk to your doctor, pharmacist, or health care provider.  2018 Elsevier/Gold Standard (2015-12-20 17:54:14)  Etoposide, VP-16 injection What is this medicine? ETOPOSIDE, VP-16 (e toe POE side) is a chemotherapy drug. It is used to treat testicular cancer, lung cancer, and other cancers. This medicine may be used for other purposes; ask your health care provider or pharmacist if you have questions. COMMON BRAND NAME(S): Etopophos, Toposar, VePesid What should I tell my health care provider before I take this medicine? They need to know if you have any of these conditions: -infection -kidney  disease -liver disease -low blood counts, like low white cell, platelet, or red cell counts -an unusual or allergic reaction to etoposide, other medicines, foods, dyes, or preservatives -pregnant or trying to get pregnant -breast-feeding How should I use this medicine? This medicine is for infusion into a vein. It is administered in a hospital or clinic by a specially trained health care professional. Talk to your pediatrician regarding the use of this medicine in children. Special care may be needed. Overdosage: If you think you have taken too much of this medicine contact a poison control center or emergency room at once. NOTE: This medicine is only for you. Do not share this medicine with others. What if I miss a dose? It is important not to miss your dose. Call your doctor or health care professional if you are unable to keep an appointment. What may interact with this medicine? -aspirin -certain medications for seizures like carbamazepine, phenobarbital, phenytoin, valproic acid -cyclosporine -levamisole -warfarin This list may not describe all possible interactions. Give your health care provider a list of all the medicines, herbs, non-prescription drugs, or dietary supplements you use. Also tell them if you smoke, drink alcohol, or use illegal drugs. Some items may interact with your medicine. What should I watch for while using this medicine? Visit your doctor for checks on your progress. This drug may make you feel generally unwell. This is not uncommon, as chemotherapy can affect healthy cells as well as cancer cells. Report any side effects. Continue  your course of treatment even though you feel ill unless your doctor tells you to stop. In some cases, you may be given additional medicines to help with side effects. Follow all directions for their use. Call your doctor or health care professional for advice if you get a fever, chills or sore throat, or other symptoms of a cold or  flu. Do not treat yourself. This drug decreases your body's ability to fight infections. Try to avoid being around people who are sick. This medicine may increase your risk to bruise or bleed. Call your doctor or health care professional if you notice any unusual bleeding. Talk to your doctor about your risk of cancer. You may be more at risk for certain types of cancers if you take this medicine. Do not become pregnant while taking this medicine or for at least 6 months after stopping it. Women should inform their doctor if they wish to become pregnant or think they might be pregnant. Women of child-bearing potential will need to have a negative pregnancy test before starting this medicine. There is a potential for serious side effects to an unborn child. Talk to your health care professional or pharmacist for more information. Do not breast-feed an infant while taking this medicine. Men must use a latex condom during sexual contact with a woman while taking this medicine and for at least 4 months after stopping it. A latex condom is needed even if you have had a vasectomy. Contact your doctor right away if your partner becomes pregnant. Do not donate sperm while taking this medicine and for at least 4 months after you stop taking this medicine. Men should inform their doctors if they wish to father a child. This medicine may lower sperm counts. What side effects may I notice from receiving this medicine? Side effects that you should report to your doctor or health care professional as soon as possible: -allergic reactions like skin rash, itching or hives, swelling of the face, lips, or tongue -low blood counts - this medicine may decrease the number of white blood cells, red blood cells and platelets. You may be at increased risk for infections and bleeding. -signs of infection - fever or chills, cough, sore throat, pain or difficulty passing urine -signs of decreased platelets or bleeding - bruising,  pinpoint red spots on the skin, black, tarry stools, blood in the urine -signs of decreased red blood cells - unusually weak or tired, fainting spells, lightheadedness -breathing problems -changes in vision -mouth or throat sores or ulcers -pain, redness, swelling or irritation at the injection site -pain, tingling, numbness in the hands or feet -redness, blistering, peeling or loosening of the skin, including inside the mouth -seizures -vomiting Side effects that usually do not require medical attention (report to your doctor or health care professional if they continue or are bothersome): -diarrhea -hair loss -loss of appetite -nausea -stomach pain This list may not describe all possible side effects. Call your doctor for medical advice about side effects. You may report side effects to FDA at 1-800-FDA-1088. Where should I keep my medicine? This drug is given in a hospital or clinic and will not be stored at home. NOTE: This sheet is a summary. It may not cover all possible information. If you have questions about this medicine, talk to your doctor, pharmacist, or health care provider.  2018 Elsevier/Gold Standard (2015-11-10 11:53:23)  Carboplatin injection What is this medicine? CARBOPLATIN (KAR boe pla tin) is a chemotherapy drug. It targets fast dividing  cells, like cancer cells, and causes these cells to die. This medicine is used to treat ovarian cancer and many other cancers. This medicine may be used for other purposes; ask your health care provider or pharmacist if you have questions. COMMON BRAND NAME(S): Paraplatin What should I tell my health care provider before I take this medicine? They need to know if you have any of these conditions: -blood disorders -hearing problems -kidney disease -recent or ongoing radiation therapy -an unusual or allergic reaction to carboplatin, cisplatin, other chemotherapy, other medicines, foods, dyes, or preservatives -pregnant or  trying to get pregnant -breast-feeding How should I use this medicine? This drug is usually given as an infusion into a vein. It is administered in a hospital or clinic by a specially trained health care professional. Talk to your pediatrician regarding the use of this medicine in children. Special care may be needed. Overdosage: If you think you have taken too much of this medicine contact a poison control center or emergency room at once. NOTE: This medicine is only for you. Do not share this medicine with others. What if I miss a dose? It is important not to miss a dose. Call your doctor or health care professional if you are unable to keep an appointment. What may interact with this medicine? -medicines for seizures -medicines to increase blood counts like filgrastim, pegfilgrastim, sargramostim -some antibiotics like amikacin, gentamicin, neomycin, streptomycin, tobramycin -vaccines Talk to your doctor or health care professional before taking any of these medicines: -acetaminophen -aspirin -ibuprofen -ketoprofen -naproxen This list may not describe all possible interactions. Give your health care provider a list of all the medicines, herbs, non-prescription drugs, or dietary supplements you use. Also tell them if you smoke, drink alcohol, or use illegal drugs. Some items may interact with your medicine. What should I watch for while using this medicine? Your condition will be monitored carefully while you are receiving this medicine. You will need important blood work done while you are taking this medicine. This drug may make you feel generally unwell. This is not uncommon, as chemotherapy can affect healthy cells as well as cancer cells. Report any side effects. Continue your course of treatment even though you feel ill unless your doctor tells you to stop. In some cases, you may be given additional medicines to help with side effects. Follow all directions for their use. Call your  doctor or health care professional for advice if you get a fever, chills or sore throat, or other symptoms of a cold or flu. Do not treat yourself. This drug decreases your body's ability to fight infections. Try to avoid being around people who are sick. This medicine may increase your risk to bruise or bleed. Call your doctor or health care professional if you notice any unusual bleeding. Be careful brushing and flossing your teeth or using a toothpick because you may get an infection or bleed more easily. If you have any dental work done, tell your dentist you are receiving this medicine. Avoid taking products that contain aspirin, acetaminophen, ibuprofen, naproxen, or ketoprofen unless instructed by your doctor. These medicines may hide a fever. Do not become pregnant while taking this medicine. Women should inform their doctor if they wish to become pregnant or think they might be pregnant. There is a potential for serious side effects to an unborn child. Talk to your health care professional or pharmacist for more information. Do not breast-feed an infant while taking this medicine. What side effects may I  notice from receiving this medicine? Side effects that you should report to your doctor or health care professional as soon as possible: -allergic reactions like skin rash, itching or hives, swelling of the face, lips, or tongue -signs of infection - fever or chills, cough, sore throat, pain or difficulty passing urine -signs of decreased platelets or bleeding - bruising, pinpoint red spots on the skin, black, tarry stools, nosebleeds -signs of decreased red blood cells - unusually weak or tired, fainting spells, lightheadedness -breathing problems -changes in hearing -changes in vision -chest pain -high blood pressure -low blood counts - This drug may decrease the number of white blood cells, red blood cells and platelets. You may be at increased risk for infections and bleeding. -nausea  and vomiting -pain, swelling, redness or irritation at the injection site -pain, tingling, numbness in the hands or feet -problems with balance, talking, walking -trouble passing urine or change in the amount of urine Side effects that usually do not require medical attention (report to your doctor or health care professional if they continue or are bothersome): -hair loss -loss of appetite -metallic taste in the mouth or changes in taste This list may not describe all possible side effects. Call your doctor for medical advice about side effects. You may report side effects to FDA at 1-800-FDA-1088. Where should I keep my medicine? This drug is given in a hospital or clinic and will not be stored at home. NOTE: This sheet is a summary. It may not cover all possible information. If you have questions about this medicine, talk to your doctor, pharmacist, or health care provider.  2018 Elsevier/Gold Standard (2008-02-23 14:38:05)

## 2018-03-10 NOTE — Progress Notes (Signed)
[facility] OFFICE PROGRESS NOTE  Redmond School, MD [address] [place] [address]  DIAGNOSIS: Metastatic small cell lung cancer initially diagnosed as Limited stage (T1b, N2, M0) small cell lung cancer presented with left lower lobe/infrahilar mass and large mediastinal lymphadenopathy diagnosed in October 2017.  PRIOR THERAPY: 1) Systemic chemotherapy with cisplatin 60 MG/M2 on day 1 and etoposide 120 MG/M2 on days 1, 2 and 3 status post 1 cycle. This was discontinued secondary to intolerance. 2) Systemic chemotherapy with carboplatin for AUC of 4 on day 1 and etoposide 100 MG/M2 on days 1, 2 and 3 with Neulasta support on day 4 every 3 weeks. Status post 5 cycles. This was concurrent with radiation in [place], [place]. 3) prophylactic cranial irradiation.  CURRENT THERAPY: Systemic chemotherapy with carboplatin for AUC of 5 on day 1, etoposide 100 mg/M2 on days 1, 2 and 3 as well as a Tecentriq (Atezolizumab) 1200 mg IV every 3 weeks with Neulasta support.  First dose February 17, 2018.  Status post 1 cycle.  INTERVAL HISTORY: Rebekah Henderson 62 y.o. female returns for routine follow-up visit accompanied by her friend.  The patient is feeling fine today has no specific complaints.  Following her first cycle of chemotherapy she did have intermittent nausea and vomiting as well as some diarrhea.  The diarrhea has now resolved.  She still has some intermittent nausea and vomits on a rare occasion.  She uses Zofran or Phenergan which is effective.  The patient denies fevers and chills.  Denies chest pain, shortness breath, cough, hemoptysis.  She has no nausea or vomiting today.  Denies diarrhea and constipation today.  She denies recent weight loss or night sweats.  Denies skin rashes.  Patient is here for evaluation prior to cycle 2 of her treatment.  MEDICAL HISTORY: Past Medical History:  Diagnosis Date  . 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  Anxiety    takes Prozac daily  . Arthritis   . Benign fundic gland polyps of stomach   . Colon polyps   . COPD (chronic obstructive pulmonary disease) ([place])   . Dehydration 03/06/2017  . Diverticulitis   . Dyspnea    with exertion  . Encounter for antineoplastic chemotherapy 12/03/2016  . Fibromyalgia   . GERD (gastroesophageal reflux disease)    takes Pantoprazole daily  . Hypertension    takes Metoprolol,Triamterene-HCTZ,and Amlodipine daily  . Hypothyroidism    takes Synthroid daily  . IBS (irritable bowel syndrome)   . lung ca dx'd 10/02/2016   skin, lung  . PONV (postoperative nausea and vomiting)    pt also states that she had some difficulty breathing after cervical fusion    ALLERGIES:  is allergic to penicillins; codeine; fentanyl; ranitidine hcl; keflex [cephalexin]; and lyrica [pregabalin].  MEDICATIONS:  Current Outpatient Medications  Medication Sig Dispense Refill  . amLODipine (NORVASC) 5 MG tablet Take 5 mg by mouth daily.      Marland Kitchen dicyclomine (BENTYL) 20 MG tablet Take 20 mg by mouth 3 (three) times daily as needed for spasms.    Marland Kitchen estazolam (PROSOM) 2 MG tablet Take 2 mg by mouth at bedtime.    Marland Kitchen estradiol (ESTRACE) 2 MG tablet Take 2 mg by mouth daily.      Marland Kitchen FLUoxetine (PROZAC) 40 MG capsule Take 40 mg by mouth daily.   2  . HYDROcodone-acetaminophen (NORCO/VICODIN) 5-325 MG tablet Take 1 tablet by mouth every 4 (four) hours as needed for moderate pain.  0  . levothyroxine (SYNTHROID, LEVOTHROID) 50 MCG tablet Take 50 mcg by mouth daily before breakfast.     . metoprolol succinate (TOPROL-XL) 25 MG 24 hr tablet Take 25 mg by mouth daily.    . ondansetron (ZOFRAN-ODT) 4 MG disintegrating tablet DISSOLVE 1 TABLET BY MOUTH EVERY 8 HOURS AS NEEDED FOR NAUSEA AND VOMITING. 20 tablet 1  . pantoprazole (PROTONIX) 40 MG tablet Take 40 mg by mouth 2 (two) times daily before a meal.   10  . potassium chloride SA (K-DUR,KLOR-CON) 20 MEQ tablet Take 1 tablet twice a day for  7 days then 1 tablet daily. 30 tablet 0  . albuterol (PROVENTIL HFA;VENTOLIN HFA) 108 (90 Base) MCG/ACT inhaler Inhale 2 puffs into the lungs every 4 (four) hours as needed for wheezing or shortness of breath.    . ALPRAZolam (XANAX) 0.25 MG tablet Take 2 tablets (0.5 mg total) by mouth at bedtime as needed for anxiety. (Patient not taking: Reported on 03/10/2018) 30 tablet 0  . lidocaine-prilocaine (EMLA) cream Apply 1 application topically as needed. To numb skin over port a cath: Squeeze a  small amount on cotton ball and place over port site 1-2 hours prior to chemotherapy. (Patient not taking: Reported on 03/10/2018) 30 g 0  . loperamide (IMODIUM) 2 MG capsule Take 2 mg by mouth every 2 (two) hours as needed for diarrhea or loose stools.    . promethazine (PHENERGAN) 25 MG tablet Take 1 tablet (25 mg total) by mouth every 6 (six) hours as needed. (Patient not taking: Reported on 03/10/2018) 30 tablet 10  . senna-docusate (SENOKOT-S) 8.6-50 MG tablet Take 1 tablet by mouth daily as needed.     No current facility-administered medications for this visit.    Facility-Administered Medications Ordered in Other Visits  Medication Dose Route Frequency Provider Last Rate Last Dose  . atezolizumab (TECENTRIQ) 1,200 mg in sodium chloride 0.9 % 250 mL chemo infusion  1,200 mg Intravenous Once Curt Bears, MD      . CARBOplatin (PARAPLATIN) 550 mg in sodium chloride 0.9 % 250 mL chemo infusion  550 mg Intravenous Once Curt Bears, MD      . dexamethasone (DECADRON) injection 10 mg  10 mg Intravenous Once Curt Bears, MD      . etoposide (VEPESID) 180 mg in sodium chloride 0.9 % 500 mL chemo infusion  100 mg/m2 (Treatment Plan Recorded) Intravenous Once Curt Bears, MD      . famotidine (PEPCID) IVPB 20 mg premix  20 mg Intravenous Once Curt Bears, MD      . heparin lock flush 100 unit/mL  500 Units Intracatheter Once PRN Curt Bears, MD      . sodium chloride flush (NS) 0.9 %  injection 10 mL  10 mL Intracatheter PRN Curt Bears, MD        SURGICAL HISTORY:  Past Surgical History:  Procedure Laterality Date  . BIOPSY N/A 05/25/2013   Procedure: BIOPSIES (Random Colon; Duodenal; Gastric);  Surgeon: Danie Binder, MD;  Location: [facility];  Service: Endoscopy;  Laterality: N/A;  . BLADDER SUSPENSION    . BREAST ENHANCEMENT SURGERY    . BREAST IMPLANT REMOVAL    . CERVICAL FUSION  AUG 2013  . CHOLECYSTECTOMY  1999  . COLONOSCOPY  2007 [place]   POLYPS  . COLONOSCOPY WITH PROPOFOL N/A 05/25/2013   Procedure: COLONOSCOPY WITH PROPOFOL(at cecum 0957) total withdrawal time=90min);  Surgeon: Danie Binder, MD;  Location: [facility];  Service: Endoscopy;  Laterality:  N/A;  . ESOPHAGOGASTRODUODENOSCOPY (EGD) WITH PROPOFOL N/A 05/25/2013   Procedure: ESOPHAGOGASTRODUODENOSCOPY (EGD) WITH PROPOFOL;  Surgeon: Danie Binder, MD;  Location: [facility];  Service: Endoscopy;  Laterality: N/A;  . FOOT SURGERY    . IR FLUORO GUIDE PORT INSERTION RIGHT  03/04/2018  . IR US GUIDE VASC ACCESS RIGHT  03/04/2018  . POLYPECTOMY N/A 05/25/2013   Procedure: POLYPECTOMY (Rectal and Gastric);  Surgeon: Danie Binder, MD;  Location: [facility];  Service: Endoscopy;  Laterality: N/A;  . TONSILLECTOMY    . UPPER GASTROINTESTINAL ENDOSCOPY    . VIDEO BRONCHOSCOPY WITH ENDOBRONCHIAL ULTRASOUND  09/12/2016   Procedure: VIDEO BRONCHOSCOPY WITH ENDOBRONCHIAL ULTRASOUND AND BIOPSY;  Surgeon: Juanito Doom, MD;  Location: [facility];  Service: Cardiopulmonary;;    REVIEW OF SYSTEMS:   Review of Systems  Constitutional: Negative for appetite change, chills, fatigue, fever and unexpected weight change.  HENT:   Negative for mouth sores, nosebleeds, sore throat and trouble swallowing.   Eyes: Negative for eye problems and icterus.  Respiratory: Negative for cough, hemoptysis, shortness of breath and wheezing.   Cardiovascular: Negative for chest pain and leg swelling.  Gastrointestinal: Negative for  abdominal pain, constipation, diarrhea, nausea and vomiting.  Genitourinary: Negative for bladder incontinence, difficulty urinating, dysuria, frequency and hematuria.   Musculoskeletal: Negative for back pain, gait problem, neck pain and neck stiffness.  Skin: Negative for itching and rash.  Neurological: Negative for dizziness, extremity weakness, gait problem, headaches, light-headedness and seizures.  Hematological: Negative for adenopathy. Does not bruise/bleed easily.  Psychiatric/Behavioral: Negative for confusion, depression and sleep disturbance. The patient is not nervous/anxious.     PHYSICAL EXAMINATION:  Blood pressure 128/74, pulse 81, temperature 97.9 F (36.6 C), temperature source Oral, resp. rate 18, height 5\' 4"  (1.626 m), weight 166 lb 4.8 oz (75.4 kg), SpO2 94 %.  ECOG PERFORMANCE STATUS: 1 - Symptomatic but completely ambulatory  Physical Exam  Constitutional: Oriented to person, place, and time and well-developed, well-nourished, and in no distress. No distress.  HENT:  Head: Normocephalic and atraumatic.  Mouth/Throat: Oropharynx is clear and moist. No oropharyngeal exudate.  Eyes: Conjunctivae are normal. Right eye exhibits no discharge. Left eye exhibits no discharge. No scleral icterus.  Neck: Normal range of motion. Neck supple.  Cardiovascular: Normal rate, regular rhythm, normal heart sounds and intact distal pulses.   Pulmonary/Chest: Effort normal and breath sounds normal. No respiratory distress. No wheezes. No rales.  Abdominal: Soft. Bowel sounds are normal. Exhibits no distension and no mass. There is no tenderness.  Musculoskeletal: Normal range of motion. Exhibits no edema.  Lymphadenopathy:    No cervical adenopathy.  Neurological: Alert and oriented to person, place, and time. Exhibits normal muscle tone. Gait normal. Coordination normal.  Skin: Skin is warm and dry. No rash noted. Not diaphoretic. No erythema. No pallor.  Psychiatric: Mood,  memory and judgment normal.  Vitals reviewed.  LABORATORY DATA: Lab Results  Component Value Date   WBC 7.3 03/10/2018   HGB 11.0 (L) 03/04/2018   HCT 34.6 (L) 03/10/2018   MCV 94.0 03/10/2018   PLT 195 03/10/2018      Chemistry      Component Value Date/Time   NA 141 03/10/2018 1159   NA 141 09/08/2017 1009   K 4.3 03/10/2018 1159   K 3.5 09/08/2017 1009   CL 107 03/10/2018 1159   CO2 24 03/10/2018 1159   CO2 29 09/08/2017 1009   BUN 5 (L) 03/10/2018 1159   BUN  5.8 (L) 09/08/2017 1009   CREATININE 0.73 03/10/2018 1159   CREATININE 0.7 09/08/2017 1009      Component Value Date/Time   CALCIUM 8.5 03/10/2018 1159   CALCIUM 9.0 09/08/2017 1009   ALKPHOS 63 03/10/2018 1159   ALKPHOS 49 09/08/2017 1009   AST 11 03/10/2018 1159   AST 9 09/08/2017 1009   ALT 6 03/10/2018 1159   ALT <6 09/08/2017 1009   BILITOT 0.5 03/10/2018 1159   BILITOT 0.51 09/08/2017 1009       RADIOGRAPHIC STUDIES:  Mr Jeri Cos KG Contrast  Result Date: 02/09/2018 CLINICAL DATA:  Small-cell lung cancer. Prior prophylactic cranial irradiation. EXAM: MRI HEAD WITHOUT AND WITH CONTRAST TECHNIQUE: Multiplanar, multiecho pulse sequences of the brain and surrounding structures were obtained without and with intravenous contrast. CONTRAST:  85mL MULTIHANCE GADOBENATE DIMEGLUMINE 529 MG/ML IV SOLN COMPARISON:  09/19/2017 FINDINGS: Brain: There is no evidence of acute infarct, intracranial hemorrhage, mass, midline shift, or extra-axial fluid collection. Mild cerebral atrophy is unchanged. Patchy T2 hyperintensities in the periventricular/deep cerebral white matter and pons have progressed without mass effect. A chronic lacunar infarct involving the right corona radiata and caudate is unchanged. No abnormal brain parenchymal or meningeal enhancement is identified. Vascular: Major intracranial vascular flow voids are preserved. Skull and upper cervical spine: Unremarkable bone marrow signal. Sinuses/Orbits:  Unremarkable orbits. Clear paranasal sinuses. Unchanged small left mastoid effusion. Other: None. IMPRESSION: 1. No evidence of intracranial metastases. 2. Progressive white matter T2 hyperintensity likely reflecting post treatment changes. Electronically Signed   By: Logan Bores M.D.   On: 02/09/2018 08:59   Nm Pet Image Restag (ps) Skull Base To Thigh  Result Date: 02/09/2018 CLINICAL DATA:  Subsequent treatment strategy for small cell lung cancer. EXAM: NUCLEAR MEDICINE PET SKULL BASE TO THIGH TECHNIQUE: 9.44 mCi F-18 FDG was injected intravenously. Full-ring PET imaging was performed from the skull base to thigh after the radiotracer. CT data was obtained and used for attenuation correction and anatomic localization. Fasting blood glucose: 85 mg/dl Mediastinal blood pool activity: SUV max 2.04 COMPARISON:  PET-CT 09/27/2016 and recent chest CT 01/30/2018 and abdominal CT scan 02/06/2018. FINDINGS: NECK: No hypermetabolic lymph nodes in the neck. Incidental CT findings: none CHEST: Bulky aorticopulmonary window lymphadenopathy is hypermetabolic with SUV max of 25.4 consistent with recurrent disease. Stable radiation changes involving the left hilum and paramediastinal long. No hypermetabolism in this area. Subpleural airspace opacity in the right lower lobe is hypermetabolic with SUV max of 4.0 but this has the appearance of an infiltrate on the chest CT. I do not see a discrete pulmonary nodule or mass. Incidental CT findings: Surgical changes involving the right breast and scattered macro calcifications and asymmetric breast parenchyma but no focal mass or hypermetabolism. ABDOMEN/PELVIS: 4 cm segment 3 liver lesion is hypermetabolic with SUV max of 27.0 and most consistent with metastatic disease. This has been recently biopsied. No other liver lesions are identified. Small hypermetabolic lesion is noted in the spleen. This is difficult to identified for certain on the CT scan but I believe on the liver  windows it measures 11 mm on image number 100 and the SUV max is 10.0. No adrenal gland metastasis are identified. No abdominal/pelvic lymphadenopathy. Incidental CT findings: none SKELETON: There is a metastatic lesion involving the L3 vertebral body with SUV max of 17.3. No other obvious the bony metastatic disease. Incidental CT findings: none IMPRESSION: 1. Bulky aorticopulmonary window hypermetabolic lymphadenopathy consistent with local recurrence. 2. Hypermetabolic hepatic, splenic and  bone lesions consistent with metastatic disease as detailed above. Electronically Signed   By: Marijo Sanes M.D.   On: 02/09/2018 11:32   Ir US Guide Vasc Access Right  Result Date: 03/04/2018 CLINICAL DATA:  Metastatic small cell lung carcinoma and poor venous access. Port placement has been requested for chemotherapy, IV infusions and blood draws. EXAM: IMPLANTED PORT A CATH PLACEMENT WITH ULTRASOUND AND FLUOROSCOPIC GUIDANCE ANESTHESIA/SEDATION: 4.0 mg IV Versed; 1 mg IV Dilaudid Total Moderate Sedation Time:  36 minutes The patient's level of consciousness and physiologic status were continuously monitored during the procedure by Radiology nursing. Additional Medications: 1 g IV vancomycin. FLUOROSCOPY TIME:  12 seconds.  1.9 mGy. PROCEDURE: The procedure, risks, benefits, and alternatives were explained to the patient. Questions regarding the procedure were encouraged and answered. The patient understands and consents to the procedure. A time-out was performed prior to initiating the procedure. Ultrasound was utilized to confirm patency of the right internal jugular vein. The right neck and chest were prepped with chlorhexidine in a sterile fashion, and a sterile drape was applied covering the operative field. Maximum barrier sterile technique with sterile gowns and gloves were used for the procedure. Local anesthesia was provided with 1% lidocaine. After creating a small venotomy incision, a 21 gauge needle was  advanced into the right internal jugular vein under direct, real-time ultrasound guidance. Ultrasound image documentation was performed. After securing guidewire access, an 8 Fr dilator was placed. A J-wire was kinked to measure appropriate catheter length. A subcutaneous port pocket was then created along the upper chest wall utilizing sharp and blunt dissection. Portable cautery was utilized. The pocket was irrigated with sterile saline. A single lumen power injectable port was chosen for placement. The 8 Fr catheter was tunneled from the port pocket site to the venotomy incision. The port was placed in the pocket. External catheter was trimmed to appropriate length based on guidewire measurement. At the venotomy, an 8 Fr peel-away sheath was placed over a guidewire. The catheter was then placed through the sheath and the sheath removed. Final catheter positioning was confirmed and documented with a fluoroscopic spot image. The port was accessed with a needle and aspirated and flushed with heparinized saline. The access needle was removed. The venotomy and port pocket incisions were closed with subcutaneous 3-0 Monocryl and subcuticular 4-0 Vicryl. Dermabond was applied to both incisions. COMPLICATIONS: COMPLICATIONS None FINDINGS: After catheter placement, the tip lies at the cavo-atrial junction. The catheter aspirates normally and is ready for immediate use. IMPRESSION: Placement of single lumen port a cath via right internal jugular vein. The catheter tip lies at the cavo-atrial junction. A power injectable port a cath was placed and is ready for immediate use. Electronically Signed   By: Aletta Edouard M.D.   On: 03/04/2018 10:15   Ir Fluoro Guide Port Insertion Right  Result Date: 03/04/2018 CLINICAL DATA:  Metastatic small cell lung carcinoma and poor venous access. Port placement has been requested for chemotherapy, IV infusions and blood draws. EXAM: IMPLANTED PORT A CATH PLACEMENT WITH ULTRASOUND AND  FLUOROSCOPIC GUIDANCE ANESTHESIA/SEDATION: 4.0 mg IV Versed; 1 mg IV Dilaudid Total Moderate Sedation Time:  36 minutes The patient's level of consciousness and physiologic status were continuously monitored during the procedure by Radiology nursing. Additional Medications: 1 g IV vancomycin. FLUOROSCOPY TIME:  12 seconds.  1.9 mGy. PROCEDURE: The procedure, risks, benefits, and alternatives were explained to the patient. Questions regarding the procedure were encouraged and answered. The patient understands and consents  to the procedure. A time-out was performed prior to initiating the procedure. Ultrasound was utilized to confirm patency of the right internal jugular vein. The right neck and chest were prepped with chlorhexidine in a sterile fashion, and a sterile drape was applied covering the operative field. Maximum barrier sterile technique with sterile gowns and gloves were used for the procedure. Local anesthesia was provided with 1% lidocaine. After creating a small venotomy incision, a 21 gauge needle was advanced into the right internal jugular vein under direct, real-time ultrasound guidance. Ultrasound image documentation was performed. After securing guidewire access, an 8 Fr dilator was placed. A J-wire was kinked to measure appropriate catheter length. A subcutaneous port pocket was then created along the upper chest wall utilizing sharp and blunt dissection. Portable cautery was utilized. The pocket was irrigated with sterile saline. A single lumen power injectable port was chosen for placement. The 8 Fr catheter was tunneled from the port pocket site to the venotomy incision. The port was placed in the pocket. External catheter was trimmed to appropriate length based on guidewire measurement. At the venotomy, an 8 Fr peel-away sheath was placed over a guidewire. The catheter was then placed through the sheath and the sheath removed. Final catheter positioning was confirmed and documented with a  fluoroscopic spot image. The port was accessed with a needle and aspirated and flushed with heparinized saline. The access needle was removed. The venotomy and port pocket incisions were closed with subcutaneous 3-0 Monocryl and subcuticular 4-0 Vicryl. Dermabond was applied to both incisions. COMPLICATIONS: COMPLICATIONS None FINDINGS: After catheter placement, the tip lies at the cavo-atrial junction. The catheter aspirates normally and is ready for immediate use. IMPRESSION: Placement of single lumen port a cath via right internal jugular vein. The catheter tip lies at the cavo-atrial junction. A power injectable port a cath was placed and is ready for immediate use. Electronically Signed   By: Aletta Edouard M.D.   On: 03/04/2018 10:15     ASSESSMENT/PLAN:  Small cell carcinoma of upper lobe of left lung [facility]) This is a very pleasant 62 year old white female was limited stage small cell lung cancer, status post 6 cycles of systemic chemotherapy with carboplatin and etoposide concurrent with radiation and followed by prophylactic cranial irradiation. Patient has been in observation for more than a year.  She has been doing fine but recently started complaining of increasing fatigue and weakness as well as headache. Repeat CT scan of the chest showed concerning findings for disease recurrence in the lung as well as new lesion in the liver. PET scan which was performed recently and unfortunately showed hypermetabolic activity in the mediastinal lymph nodes in addition to the metastatic disease to the liver and bone including L3 bone lesion. Ultrasound-guided core biopsy of one of the suspicious liver lesion was consistent with recurrent small cell carcinoma. The patient is now on palliative systemic chemotherapy with carboplatin for AUC of 5 on day 1, etoposide 100 mg/M2 on days 1, 2 and 3 in addition to Tecentriq (Atezolizumab) 1200 mg IV every 3 weeks with Neulasta support.  She is status post 1  cycle.  She tolerated the first cycle well overall with the exception of intermittent nausea, vomiting, diarrhea.  Symptoms were controlled with oral medications.  Recommend for the patient to proceed with cycle 2 of her treatment as scheduled today.  We will have weekly labs closer to home at [facility]. The patient will follow up with [place] in approximately  3 weeks for evaluation prior to cycle #3 of her treatment.  Plan is for restaging scans after 3 cycles.  For anxiety, she may continue Xanax.  For nausea, she may continue Zofran and Phenergan.  I have refilled her Zofran today.   The patient voices understanding of current disease status and treatment options and is in agreement with the current care plan. All questions were answered. The patient knows to call the clinic with any problems, questions or concerns. We can certainly see the patient much sooner if necessary.   No orders of the defined types were placed in this encounter.  Mikey Bussing, DNP, AGPCNP-BC, AOCNP 03/10/18

## 2018-03-10 NOTE — Telephone Encounter (Signed)
Scheduled appt per 4/9 los - patient to get an updated schedule in the treatment area.

## 2018-03-11 ENCOUNTER — Inpatient Hospital Stay: Payer: Medicare Other

## 2018-03-11 VITALS — BP 115/65 | HR 70 | Temp 97.9°F | Resp 16

## 2018-03-11 DIAGNOSIS — C3432 Malignant neoplasm of lower lobe, left bronchus or lung: Secondary | ICD-10-CM | POA: Diagnosis not present

## 2018-03-11 DIAGNOSIS — C3492 Malignant neoplasm of unspecified part of left bronchus or lung: Secondary | ICD-10-CM

## 2018-03-11 MED ORDER — DEXAMETHASONE SODIUM PHOSPHATE 10 MG/ML IJ SOLN
10.0000 mg | Freq: Once | INTRAMUSCULAR | Status: AC
  Administered 2018-03-11: 10 mg via INTRAVENOUS

## 2018-03-11 MED ORDER — SODIUM CHLORIDE 0.9 % IV SOLN
100.0000 mg/m2 | Freq: Once | INTRAVENOUS | Status: AC
  Administered 2018-03-11: 180 mg via INTRAVENOUS
  Filled 2018-03-11: qty 9

## 2018-03-11 MED ORDER — SODIUM CHLORIDE 0.9 % IV SOLN
Freq: Once | INTRAVENOUS | Status: AC
  Administered 2018-03-11: 14:00:00 via INTRAVENOUS

## 2018-03-11 MED ORDER — SODIUM CHLORIDE 0.9% FLUSH
10.0000 mL | INTRAVENOUS | Status: DC | PRN
  Administered 2018-03-11: 10 mL
  Filled 2018-03-11: qty 10

## 2018-03-11 MED ORDER — DEXAMETHASONE SODIUM PHOSPHATE 10 MG/ML IJ SOLN
INTRAMUSCULAR | Status: AC
  Filled 2018-03-11: qty 1

## 2018-03-11 MED ORDER — HEPARIN SOD (PORK) LOCK FLUSH 100 UNIT/ML IV SOLN
500.0000 [IU] | Freq: Once | INTRAVENOUS | Status: AC | PRN
  Administered 2018-03-11: 500 [IU]
  Filled 2018-03-11: qty 5

## 2018-03-11 NOTE — Patient Instructions (Signed)
[facility] Discharge Instructions for Patients Receiving Chemotherapy  Today you received the following chemotherapy agents Etoposide.   To help prevent nausea and vomiting after your treatment, we encourage you to take your nausea medication as directed.   If you develop nausea and vomiting that is not controlled by your nausea medication, call the clinic.   BELOW ARE SYMPTOMS THAT SHOULD BE REPORTED IMMEDIATELY:  *FEVER GREATER THAN 100.5 F  *CHILLS WITH OR WITHOUT FEVER  NAUSEA AND VOMITING THAT IS NOT CONTROLLED WITH YOUR NAUSEA MEDICATION  *UNUSUAL SHORTNESS OF BREATH  *UNUSUAL BRUISING OR BLEEDING  TENDERNESS IN MOUTH AND THROAT WITH OR WITHOUT PRESENCE OF ULCERS  *URINARY PROBLEMS  *BOWEL PROBLEMS  UNUSUAL RASH Items with * indicate a potential emergency and should be followed up as soon as possible.  Feel free to call the clinic should you have any questions or concerns. The clinic phone number is [phone].  Please show the [facility] at check-in to the Emergency Department and triage nurse.

## 2018-03-12 ENCOUNTER — Inpatient Hospital Stay: Payer: Medicare Other

## 2018-03-12 VITALS — BP 134/73 | HR 71 | Temp 98.4°F | Resp 16

## 2018-03-12 DIAGNOSIS — C3432 Malignant neoplasm of lower lobe, left bronchus or lung: Secondary | ICD-10-CM | POA: Diagnosis not present

## 2018-03-12 DIAGNOSIS — C3492 Malignant neoplasm of unspecified part of left bronchus or lung: Secondary | ICD-10-CM

## 2018-03-12 MED ORDER — PEGFILGRASTIM 6 MG/0.6ML ~~LOC~~ PSKT
6.0000 mg | PREFILLED_SYRINGE | Freq: Once | SUBCUTANEOUS | Status: AC
  Administered 2018-03-12: 6 mg via SUBCUTANEOUS

## 2018-03-12 MED ORDER — HEPARIN SOD (PORK) LOCK FLUSH 100 UNIT/ML IV SOLN
500.0000 [IU] | Freq: Once | INTRAVENOUS | Status: AC | PRN
  Administered 2018-03-12: 500 [IU]
  Filled 2018-03-12: qty 5

## 2018-03-12 MED ORDER — DEXAMETHASONE SODIUM PHOSPHATE 10 MG/ML IJ SOLN
10.0000 mg | Freq: Once | INTRAMUSCULAR | Status: AC
  Administered 2018-03-12: 10 mg via INTRAVENOUS

## 2018-03-12 MED ORDER — SODIUM CHLORIDE 0.9 % IV SOLN
Freq: Once | INTRAVENOUS | Status: AC
  Administered 2018-03-12: 13:00:00 via INTRAVENOUS

## 2018-03-12 MED ORDER — SODIUM CHLORIDE 0.9 % IV SOLN
100.0000 mg/m2 | Freq: Once | INTRAVENOUS | Status: AC
  Administered 2018-03-12: 180 mg via INTRAVENOUS
  Filled 2018-03-12: qty 9

## 2018-03-12 MED ORDER — SODIUM CHLORIDE 0.9% FLUSH
10.0000 mL | INTRAVENOUS | Status: DC | PRN
  Administered 2018-03-12: 10 mL
  Filled 2018-03-12: qty 10

## 2018-03-12 MED ORDER — PEGFILGRASTIM 6 MG/0.6ML ~~LOC~~ PSKT
PREFILLED_SYRINGE | SUBCUTANEOUS | Status: AC
  Filled 2018-03-12: qty 0.6

## 2018-03-12 MED ORDER — DEXAMETHASONE SODIUM PHOSPHATE 10 MG/ML IJ SOLN
INTRAMUSCULAR | Status: AC
  Filled 2018-03-12: qty 1

## 2018-03-12 NOTE — Patient Instructions (Signed)
[facility] Discharge Instructions for Patients Receiving Chemotherapy  Today you received the following chemotherapy agents Etoposide  To help prevent nausea and vomiting after your treatment, we encourage you to take your nausea medication as directed.    If you develop nausea and vomiting that is not controlled by your nausea medication, call the clinic.   BELOW ARE SYMPTOMS THAT SHOULD BE REPORTED IMMEDIATELY:  *FEVER GREATER THAN 100.5 F  *CHILLS WITH OR WITHOUT FEVER  NAUSEA AND VOMITING THAT IS NOT CONTROLLED WITH YOUR NAUSEA MEDICATION  *UNUSUAL SHORTNESS OF BREATH  *UNUSUAL BRUISING OR BLEEDING  TENDERNESS IN MOUTH AND THROAT WITH OR WITHOUT PRESENCE OF ULCERS  *URINARY PROBLEMS  *BOWEL PROBLEMS  UNUSUAL RASH Items with * indicate a potential emergency and should be followed up as soon as possible.  Feel free to call the clinic should you have any questions or concerns. The clinic phone number is [phone].  Please show the [facility] at check-in to the Emergency Department and triage nurse.

## 2018-03-13 ENCOUNTER — Telehealth: Payer: Self-pay | Admitting: Gastroenterology

## 2018-03-13 NOTE — Telephone Encounter (Signed)
Case cancelled with surgical scheduling Patient notified via voicemail to call back if she wants to reschedule.

## 2018-03-14 ENCOUNTER — Ambulatory Visit: Payer: Medicare Other

## 2018-03-16 ENCOUNTER — Other Ambulatory Visit: Payer: Self-pay | Admitting: Oncology

## 2018-03-16 DIAGNOSIS — R112 Nausea with vomiting, unspecified: Secondary | ICD-10-CM

## 2018-03-17 DIAGNOSIS — Z6829 Body mass index (BMI) 29.0-29.9, adult: Secondary | ICD-10-CM | POA: Diagnosis not present

## 2018-03-17 DIAGNOSIS — J449 Chronic obstructive pulmonary disease, unspecified: Secondary | ICD-10-CM | POA: Diagnosis not present

## 2018-03-17 DIAGNOSIS — E663 Overweight: Secondary | ICD-10-CM | POA: Diagnosis not present

## 2018-03-17 DIAGNOSIS — G894 Chronic pain syndrome: Secondary | ICD-10-CM | POA: Diagnosis not present

## 2018-03-24 ENCOUNTER — Ambulatory Visit (HOSPITAL_COMMUNITY): Admit: 2018-03-24 | Payer: Medicare Other | Admitting: Gastroenterology

## 2018-03-24 SURGERY — ESOPHAGOGASTRODUODENOSCOPY (EGD) WITH PROPOFOL
Anesthesia: Monitor Anesthesia Care

## 2018-03-31 DIAGNOSIS — R0902 Hypoxemia: Secondary | ICD-10-CM | POA: Diagnosis not present

## 2018-03-31 DIAGNOSIS — J449 Chronic obstructive pulmonary disease, unspecified: Secondary | ICD-10-CM | POA: Diagnosis not present

## 2018-04-01 ENCOUNTER — Inpatient Hospital Stay: Payer: Medicare Other

## 2018-04-01 ENCOUNTER — Inpatient Hospital Stay (HOSPITAL_BASED_OUTPATIENT_CLINIC_OR_DEPARTMENT_OTHER): Payer: Medicare Other | Admitting: Oncology

## 2018-04-01 ENCOUNTER — Inpatient Hospital Stay: Payer: Medicare Other | Attending: Internal Medicine

## 2018-04-01 ENCOUNTER — Encounter: Payer: Self-pay | Admitting: Oncology

## 2018-04-01 ENCOUNTER — Telehealth: Payer: Self-pay | Admitting: Oncology

## 2018-04-01 VITALS — BP 121/70 | HR 75 | Temp 97.6°F | Resp 18 | Ht 64.0 in | Wt 166.8 lb

## 2018-04-01 DIAGNOSIS — Z5111 Encounter for antineoplastic chemotherapy: Secondary | ICD-10-CM | POA: Insufficient documentation

## 2018-04-01 DIAGNOSIS — R110 Nausea: Secondary | ICD-10-CM | POA: Insufficient documentation

## 2018-04-01 DIAGNOSIS — J449 Chronic obstructive pulmonary disease, unspecified: Secondary | ICD-10-CM

## 2018-04-01 DIAGNOSIS — F419 Anxiety disorder, unspecified: Secondary | ICD-10-CM

## 2018-04-01 DIAGNOSIS — C3412 Malignant neoplasm of upper lobe, left bronchus or lung: Secondary | ICD-10-CM | POA: Diagnosis not present

## 2018-04-01 DIAGNOSIS — C787 Secondary malignant neoplasm of liver and intrahepatic bile duct: Secondary | ICD-10-CM | POA: Insufficient documentation

## 2018-04-01 DIAGNOSIS — E039 Hypothyroidism, unspecified: Secondary | ICD-10-CM | POA: Diagnosis not present

## 2018-04-01 DIAGNOSIS — C3492 Malignant neoplasm of unspecified part of left bronchus or lung: Secondary | ICD-10-CM

## 2018-04-01 DIAGNOSIS — Z79899 Other long term (current) drug therapy: Secondary | ICD-10-CM | POA: Diagnosis not present

## 2018-04-01 DIAGNOSIS — Z5112 Encounter for antineoplastic immunotherapy: Secondary | ICD-10-CM

## 2018-04-01 DIAGNOSIS — Z923 Personal history of irradiation: Secondary | ICD-10-CM | POA: Insufficient documentation

## 2018-04-01 DIAGNOSIS — F411 Generalized anxiety disorder: Secondary | ICD-10-CM | POA: Diagnosis not present

## 2018-04-01 DIAGNOSIS — D6481 Anemia due to antineoplastic chemotherapy: Secondary | ICD-10-CM

## 2018-04-01 DIAGNOSIS — C7951 Secondary malignant neoplasm of bone: Secondary | ICD-10-CM | POA: Insufficient documentation

## 2018-04-01 DIAGNOSIS — R5382 Chronic fatigue, unspecified: Secondary | ICD-10-CM

## 2018-04-01 LAB — CMP (CANCER CENTER ONLY)
ALT: <6 U/L (ref 0–55)
AST: 10 U/L (ref 5–34)
Albumin: 3.6 g/dL (ref 3.5–5.0)
Alkaline Phosphatase: 52 U/L (ref 40–150)
Anion gap: 8 (ref 3–11)
BUN: 7 mg/dL (ref 7–26)
CHLORIDE: 109 mmol/L (ref 98–109)
CO2: 24 mmol/L (ref 22–29)
Calcium: 8.5 mg/dL (ref 8.4–10.4)
Creatinine: 0.69 mg/dL (ref 0.60–1.10)
GFR, Est AFR Am: >60 mL/min (ref >60)
GFR, Estimated: >60 mL/min (ref >60)
GLUCOSE: 86 mg/dL (ref 70–140)
POTASSIUM: 3.8 mmol/L (ref 3.5–5.1)
SODIUM: 141 mmol/L (ref 136–145)
Total Bilirubin: 0.5 mg/dL (ref 0.2–1.2)
Total Protein: 6.4 g/dL (ref 6.4–8.3)

## 2018-04-01 LAB — CBC WITH DIFFERENTIAL (CANCER CENTER ONLY)
Basophils Absolute: 0 10*3/uL (ref 0.0–0.1)
Basophils Relative: 0 %
EOS PCT: 0 %
Eosinophils Absolute: 0 10*3/uL (ref 0.0–0.5)
HEMATOCRIT: 28.4 % — AB (ref 34.8–46.6)
Hemoglobin: 9.3 g/dL — ABNORMAL LOW (ref 11.6–15.9)
LYMPHS ABS: 1.1 10*3/uL (ref 0.9–3.3)
LYMPHS PCT: 19 %
MCH: 31.8 pg (ref 25.1–34.0)
MCHC: 32.7 g/dL (ref 31.5–36.0)
MCV: 97.3 fL (ref 79.5–101.0)
MONO ABS: 0.7 10*3/uL (ref 0.1–0.9)
Monocytes Relative: 13 %
Neutro Abs: 3.8 10*3/uL (ref 1.5–6.5)
Neutrophils Relative %: 68 %
Platelet Count: 198 10*3/uL (ref 145–400)
RBC: 2.92 MIL/uL — AB (ref 3.70–5.45)
RDW: 19 % — AB (ref 11.2–14.5)
WBC: 5.7 10*3/uL (ref 3.9–10.3)
nRBC: 1 /100 WBC — ABNORMAL HIGH

## 2018-04-01 LAB — TSH: TSH: 2.429 u[IU]/mL (ref 0.308–3.960)

## 2018-04-01 MED ORDER — SODIUM CHLORIDE 0.9 % IV SOLN
100.0000 mg/m2 | Freq: Once | INTRAVENOUS | Status: AC
  Administered 2018-04-01: 180 mg via INTRAVENOUS
  Filled 2018-04-01: qty 9

## 2018-04-01 MED ORDER — FAMOTIDINE IN NACL 20-0.9 MG/50ML-% IV SOLN
20.0000 mg | Freq: Once | INTRAVENOUS | Status: AC
  Administered 2018-04-01: 20 mg via INTRAVENOUS

## 2018-04-01 MED ORDER — DEXAMETHASONE SODIUM PHOSPHATE 10 MG/ML IJ SOLN
INTRAMUSCULAR | Status: AC
  Filled 2018-04-01: qty 1

## 2018-04-01 MED ORDER — PALONOSETRON HCL INJECTION 0.25 MG/5ML
INTRAVENOUS | Status: AC
  Filled 2018-04-01: qty 5

## 2018-04-01 MED ORDER — DIPHENHYDRAMINE HCL 50 MG/ML IJ SOLN
INTRAMUSCULAR | Status: AC
Start: 2018-04-01 — End: 2018-04-01
  Filled 2018-04-01: qty 1

## 2018-04-01 MED ORDER — FAMOTIDINE IN NACL 20-0.9 MG/50ML-% IV SOLN
INTRAVENOUS | Status: AC
  Filled 2018-04-01: qty 50

## 2018-04-01 MED ORDER — SODIUM CHLORIDE 0.9% FLUSH
10.0000 mL | INTRAVENOUS | Status: DC | PRN
  Administered 2018-04-01: 10 mL
  Filled 2018-04-01: qty 10

## 2018-04-01 MED ORDER — ATEZOLIZUMAB CHEMO INJECTION 1200 MG/20ML
1200.0000 mg | Freq: Once | INTRAVENOUS | Status: AC
  Administered 2018-04-01: 1200 mg via INTRAVENOUS
  Filled 2018-04-01: qty 20

## 2018-04-01 MED ORDER — DEXAMETHASONE SODIUM PHOSPHATE 10 MG/ML IJ SOLN
10.0000 mg | Freq: Once | INTRAMUSCULAR | Status: AC
  Administered 2018-04-01: 10 mg via INTRAVENOUS

## 2018-04-01 MED ORDER — HEPARIN SOD (PORK) LOCK FLUSH 100 UNIT/ML IV SOLN
500.0000 [IU] | Freq: Once | INTRAVENOUS | Status: AC | PRN
  Administered 2018-04-01: 500 [IU]
  Filled 2018-04-01: qty 5

## 2018-04-01 MED ORDER — DIPHENHYDRAMINE HCL 50 MG/ML IJ SOLN
25.0000 mg | Freq: Once | INTRAMUSCULAR | Status: AC
  Administered 2018-04-01: 25 mg via INTRAVENOUS

## 2018-04-01 MED ORDER — PALONOSETRON HCL INJECTION 0.25 MG/5ML
0.2500 mg | Freq: Once | INTRAVENOUS | Status: AC
  Administered 2018-04-01: 0.25 mg via INTRAVENOUS

## 2018-04-01 MED ORDER — SODIUM CHLORIDE 0.9 % IV SOLN
547.5000 mg | Freq: Once | INTRAVENOUS | Status: AC
  Administered 2018-04-01: 550 mg via INTRAVENOUS
  Filled 2018-04-01: qty 55

## 2018-04-01 MED ORDER — SODIUM CHLORIDE 0.9 % IV SOLN
Freq: Once | INTRAVENOUS | Status: AC
  Administered 2018-04-01: 11:00:00 via INTRAVENOUS

## 2018-04-01 NOTE — Telephone Encounter (Signed)
Scheduled appt per 5/1 los - patient to get an updated schedule next visit.

## 2018-04-01 NOTE — Assessment & Plan Note (Signed)
This is a very pleasant 62 year old white female was limited stage small cell lung cancer, status post 6 cycles of systemic chemotherapy with carboplatin and etoposide concurrent with radiation and followed by prophylactic cranial irradiation. Patient has been in observation for more than a year. She has been doing fine but recently started complaining of increasing fatigue and weakness as well as headache. Repeat CT scan of the chest showed concerning findings for disease recurrence in the lung as well as new lesion in the liver. PET scan which was performed recently and unfortunately showed hypermetabolic activity in the mediastinal lymph nodes in addition to the metastatic disease to the liver and bone including L3 bone lesion. Ultrasound-guided core biopsy ofoneof the suspicious liver lesion was consistent with recurrent small cell carcinoma. The patient is now on palliative systemic chemotherapy with carboplatin for AUC of 5 on day 1, etoposide 100 mg/M2 on days 1, 2 and 3 in addition to Tecentriq (Atezolizumab) 1200 mg IV every 3 weeks with Neulasta support.  She is status post 2 cycles.    She is tolerating her treatment fairly well with the exception of fatigue and intermittent nausea which is controlled with antiemetics.  The patient was seen with Dr. Julien Nordmann.  Recommend that she proceed with cycle 3 of her treatment as scheduled.  She was reminded to get weekly labs at [facility].  She is anemic with a hemoglobin of 9.3.  We will consider a blood transfusion if her hemoglobin drops to 8.0 or less.  The patient will have a restaging CT scan of the chest, abdomen, pelvis prior to cycle #4.  She will follow-up in 3 weeks for evaluation prior to cycle 4.  For anxiety, she may continue Xanax.  For nausea, she may continue Zofran and Phenergan.    The patient voices understanding of current disease status and treatment options and is in agreement with the current care plan. All questions  were answered. The patient knows to call the clinic with any problems, questions or concerns. We can certainly see the patient much sooner if necessary.

## 2018-04-01 NOTE — Patient Instructions (Signed)
[facility] Discharge Instructions for Patients Receiving Chemotherapy  Today you received the following chemotherapy agents: Tecentriq, Etoposide and Carboplatin.  To help prevent nausea and vomiting after your treatment, we encourage you to take your nausea medication as prescribed.   If you develop nausea and vomiting that is not controlled by your nausea medication, call the clinic.   BELOW ARE SYMPTOMS THAT SHOULD BE REPORTED IMMEDIATELY:  *FEVER GREATER THAN 100.5 F  *CHILLS WITH OR WITHOUT FEVER  NAUSEA AND VOMITING THAT IS NOT CONTROLLED WITH YOUR NAUSEA MEDICATION  *UNUSUAL SHORTNESS OF BREATH  *UNUSUAL BRUISING OR BLEEDING  TENDERNESS IN MOUTH AND THROAT WITH OR WITHOUT PRESENCE OF ULCERS  *URINARY PROBLEMS  *BOWEL PROBLEMS  UNUSUAL RASH Items with * indicate a potential emergency and should be followed up as soon as possible.  Feel free to call the clinic should you have any questions or concerns. The clinic phone number is [phone].  Please show the [facility] at check-in to the Emergency Department and triage nurse.

## 2018-04-01 NOTE — Progress Notes (Signed)
[place] OFFICE PROGRESS NOTE  Redmond School, MD [address] [place] [address]  DIAGNOSIS: Metastatic small cell lung cancer initially diagnosed asLimited stage (T1b, N2, M0) small cell lung cancer presented with left lower lobe/infrahilar mass and large mediastinal lymphadenopathy diagnosed in October 2017.  PRIOR THERAPY: 1) Systemic chemotherapy with cisplatin 60 MG/M2 on day 1 and etoposide 120 MG/M2 on days 1, 2 and 3 status post 1 cycle. This was discontinued secondary to intolerance. 2) Systemic chemotherapy with carboplatin for AUC of 4 on day 1 and etoposide 100 MG/M2 on days 1, 2 and 3 with Neulasta support on day 4 every 3 weeks. Status post 5 cycles. This was concurrent with radiation in [place], [place]. 3) prophylactic cranial irradiation.  CURRENT THERAPY: Systemic chemotherapy with carboplatin for AUC of 5 on day 1, etoposide 100 mg/M2 on days 1, 2 and 3 as well as a Tecentriq (Atezolizumab) 1200 mg IV every 3 weeks with Neulasta support. First dose February 17, 2018.  Status post 2 cycles.  INTERVAL HISTORY: Rebekah Henderson 62 y.o. female returns for routine follow-up visit accompanied by her friend.  The patient is feeling fine today with exception of increased fatigue and weakness.  The patient denies fevers and chills.  Denies chest pain, cough, hemoptysis.  She has her baseline shortness of breath.  She has intermittent nausea which is well controlled with antiemetics.  Denies vomiting, constipation, diarrhea.  Denies recent weight loss or night sweats.  No skin rashes.  The patient is here for evaluation prior to cycle 3 of her treatment.  MEDICAL HISTORY: Past Medical History:  Diagnosis Date  . 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 Anxiety    takes Prozac daily  . Arthritis   . Benign fundic gland polyps of stomach   . Colon polyps   . COPD (chronic obstructive pulmonary disease) ([place])   . Dehydration 03/06/2017  .  Diverticulitis   . Dyspnea    with exertion  . Encounter for antineoplastic chemotherapy 12/03/2016  . Fibromyalgia   . GERD (gastroesophageal reflux disease)    takes Pantoprazole daily  . Hypertension    takes Metoprolol,Triamterene-HCTZ,and Amlodipine daily  . Hypothyroidism    takes Synthroid daily  . IBS (irritable bowel syndrome)   . lung ca dx'd 10/02/2016   skin, lung  . PONV (postoperative nausea and vomiting)    pt also states that she had some difficulty breathing after cervical fusion    ALLERGIES:  is allergic to penicillins; codeine; fentanyl; ranitidine hcl; keflex [cephalexin]; and lyrica [pregabalin].  MEDICATIONS:  Current Outpatient Medications  Medication Sig Dispense Refill  . albuterol (PROVENTIL HFA;VENTOLIN HFA) 108 (90 Base) MCG/ACT inhaler Inhale 2 puffs into the lungs every 4 (four) hours as needed for wheezing or shortness of breath.    . ALPRAZolam (XANAX) 0.25 MG tablet Take 2 tablets (0.5 mg total) by mouth at bedtime as needed for anxiety. (Patient not taking: Reported on 03/10/2018) 30 tablet 0  . amLODipine (NORVASC) 5 MG tablet Take 5 mg by mouth daily.      Marland Kitchen dicyclomine (BENTYL) 20 MG tablet Take 20 mg by mouth 3 (three) times daily as needed for spasms.    Marland Kitchen estazolam (PROSOM) 2 MG tablet Take 2 mg by mouth at bedtime.    Marland Kitchen estradiol (ESTRACE) 2 MG tablet Take 2 mg by mouth daily.      Marland Kitchen FLUoxetine (PROZAC) 40 MG capsule Take 40 mg by mouth daily.  2  . HYDROcodone-acetaminophen (NORCO/VICODIN) 5-325 MG tablet Take 1 tablet by mouth every 4 (four) hours as needed for moderate pain.   0  . levothyroxine (SYNTHROID, LEVOTHROID) 50 MCG tablet Take 50 mcg by mouth daily before breakfast.     . lidocaine-prilocaine (EMLA) cream Apply 1 application topically as needed. To numb skin over port a cath: Squeeze a  small amount on cotton ball and place over port site 1-2 hours prior to chemotherapy. (Patient not taking: Reported on 03/10/2018) 30 g 0  .  loperamide (IMODIUM) 2 MG capsule Take 2 mg by mouth every 2 (two) hours as needed for diarrhea or loose stools.    . metoprolol succinate (TOPROL-XL) 25 MG 24 hr tablet Take 25 mg by mouth daily.    . ondansetron (ZOFRAN-ODT) 4 MG disintegrating tablet DISSOLVE 1 TABLET BY MOUTH EVERY 8 HOURS AS NEEDED FOR NAUSEA AND VOMITING. 20 tablet 1  . pantoprazole (PROTONIX) 40 MG tablet Take 40 mg by mouth 2 (two) times daily before a meal.   10  . potassium chloride SA (K-DUR,KLOR-CON) 20 MEQ tablet Take 1 tablet twice a day for 7 days then 1 tablet daily. 30 tablet 0  . promethazine (PHENERGAN) 25 MG tablet Take 1 tablet (25 mg total) by mouth every 6 (six) hours as needed. (Patient not taking: Reported on 03/10/2018) 30 tablet 10  . senna-docusate (SENOKOT-S) 8.6-50 MG tablet Take 1 tablet by mouth daily as needed.     No current facility-administered medications for this visit.    Facility-Administered Medications Ordered in Other Visits  Medication Dose Route Frequency Provider Last Rate Last Dose  . heparin lock flush 100 unit/mL  500 Units Intracatheter Once PRN Curt Bears, MD      . sodium chloride flush (NS) 0.9 % injection 10 mL  10 mL Intracatheter PRN Curt Bears, MD        SURGICAL HISTORY:  Past Surgical History:  Procedure Laterality Date  . BIOPSY N/A 05/25/2013   Procedure: BIOPSIES (Random Colon; Duodenal; Gastric);  Surgeon: Danie Binder, MD;  Location: [facility];  Service: Endoscopy;  Laterality: N/A;  . BLADDER SUSPENSION    . BREAST ENHANCEMENT SURGERY    . BREAST IMPLANT REMOVAL    . CERVICAL FUSION  AUG 2013  . CHOLECYSTECTOMY  1999  . COLONOSCOPY  2007 [place]   POLYPS  . COLONOSCOPY WITH PROPOFOL N/A 05/25/2013   Procedure: COLONOSCOPY WITH PROPOFOL(at cecum 0957) total withdrawal time=58min);  Surgeon: Danie Binder, MD;  Location: [facility];  Service: Endoscopy;  Laterality: N/A;  . ESOPHAGOGASTRODUODENOSCOPY (EGD) WITH PROPOFOL N/A 05/25/2013   Procedure:  ESOPHAGOGASTRODUODENOSCOPY (EGD) WITH PROPOFOL;  Surgeon: Danie Binder, MD;  Location: [facility];  Service: Endoscopy;  Laterality: N/A;  . FOOT SURGERY    . IR FLUORO GUIDE PORT INSERTION RIGHT  03/04/2018  . IR US GUIDE VASC ACCESS RIGHT  03/04/2018  . POLYPECTOMY N/A 05/25/2013   Procedure: POLYPECTOMY (Rectal and Gastric);  Surgeon: Danie Binder, MD;  Location: [facility];  Service: Endoscopy;  Laterality: N/A;  . TONSILLECTOMY    . UPPER GASTROINTESTINAL ENDOSCOPY    . VIDEO BRONCHOSCOPY WITH ENDOBRONCHIAL ULTRASOUND  09/12/2016   Procedure: VIDEO BRONCHOSCOPY WITH ENDOBRONCHIAL ULTRASOUND AND BIOPSY;  Surgeon: Juanito Doom, MD;  Location: [facility];  Service: Cardiopulmonary;;    REVIEW OF SYSTEMS:   Review of Systems  Constitutional: Negative for appetite change, chills, fever and unexpected weight change. Positive for fatigue and generalized weakness. HENT:  Negative for mouth sores, nosebleeds, sore throat and trouble swallowing.   Eyes: Negative for eye problems and icterus.  Respiratory: Negative for cough, hemoptysis, and wheezing.  She has her baseline shortness of breath with exertion. Cardiovascular: Negative for chest pain and leg swelling.  Gastrointestinal: Negative for abdominal pain, constipation, diarrhea, and vomiting. Positive for intermittent nausea which is controlled with antiemetics. Genitourinary: Negative for bladder incontinence, difficulty urinating, dysuria, frequency and hematuria.   Musculoskeletal: Negative for back pain, gait problem, neck pain and neck stiffness.  Skin: Negative for itching and rash.  Neurological: Negative for dizziness, extremity weakness, gait problem, headaches, light-headedness and seizures.  Hematological: Negative for adenopathy. Does not bruise/bleed easily.  Psychiatric/Behavioral: Negative for confusion, depression and sleep disturbance. The patient is not nervous/anxious.     PHYSICAL EXAMINATION:  Blood pressure 121/70, pulse  75, temperature 97.6 F (36.4 C), temperature source Oral, resp. rate 18, height 5\' 4"  (1.626 m), weight 166 lb 12.8 oz (75.7 kg), SpO2 99 %.  ECOG PERFORMANCE STATUS: 1 - Symptomatic but completely ambulatory  Physical Exam  Constitutional: Oriented to person, place, and time and well-developed, well-nourished, and in no distress. No distress.  HENT:  Head: Normocephalic and atraumatic.  Mouth/Throat: Oropharynx is clear and moist. No oropharyngeal exudate.  Eyes: Conjunctivae are normal. Right eye exhibits no discharge. Left eye exhibits no discharge. No scleral icterus.  Neck: Normal range of motion. Neck supple.  Cardiovascular: Normal rate, regular rhythm, normal heart sounds and intact distal pulses.   Pulmonary/Chest: Effort normal and breath sounds normal. No respiratory distress. No wheezes. No rales.  Abdominal: Soft. Bowel sounds are normal. Exhibits no distension and no mass. There is no tenderness.  Musculoskeletal: Normal range of motion. Exhibits no edema.  Lymphadenopathy:    No cervical adenopathy.  Neurological: Alert and oriented to person, place, and time. Exhibits normal muscle tone. Gait normal. Coordination normal.  Skin: Skin is warm and dry. No rash noted. Not diaphoretic. No erythema. No pallor.  Psychiatric: Mood, memory and judgment normal.  Vitals reviewed.  LABORATORY DATA: Lab Results  Component Value Date   WBC 5.7 04/01/2018   HGB 9.3 (L) 04/01/2018   HCT 28.4 (L) 04/01/2018   MCV 97.3 04/01/2018   PLT 198 04/01/2018      Chemistry      Component Value Date/Time   NA 141 04/01/2018 0924   NA 141 09/08/2017 1009   K 3.8 04/01/2018 0924   K 3.5 09/08/2017 1009   CL 109 04/01/2018 0924   CO2 24 04/01/2018 0924   CO2 29 09/08/2017 1009   BUN 7 04/01/2018 0924   BUN 5.8 (L) 09/08/2017 1009   CREATININE 0.69 04/01/2018 0924   CREATININE 0.7 09/08/2017 1009      Component Value Date/Time   CALCIUM 8.5 04/01/2018 0924   CALCIUM 9.0 09/08/2017  1009   ALKPHOS 52 04/01/2018 0924   ALKPHOS 49 09/08/2017 1009   AST 10 04/01/2018 0924   AST 9 09/08/2017 1009   ALT <6 04/01/2018 0924   ALT <6 09/08/2017 1009   BILITOT 0.5 04/01/2018 0924   BILITOT 0.51 09/08/2017 1009       RADIOGRAPHIC STUDIES:  Ir US Guide Vasc Access Right  Result Date: 03/04/2018 CLINICAL DATA:  Metastatic small cell lung carcinoma and poor venous access. Port placement has been requested for chemotherapy, IV infusions and blood draws. EXAM: IMPLANTED PORT A CATH PLACEMENT WITH ULTRASOUND AND FLUOROSCOPIC GUIDANCE ANESTHESIA/SEDATION: 4.0 mg IV Versed; 1 mg IV Dilaudid  Total Moderate Sedation Time:  36 minutes The patient's level of consciousness and physiologic status were continuously monitored during the procedure by Radiology nursing. Additional Medications: 1 g IV vancomycin. FLUOROSCOPY TIME:  12 seconds.  1.9 mGy. PROCEDURE: The procedure, risks, benefits, and alternatives were explained to the patient. Questions regarding the procedure were encouraged and answered. The patient understands and consents to the procedure. A time-out was performed prior to initiating the procedure. Ultrasound was utilized to confirm patency of the right internal jugular vein. The right neck and chest were prepped with chlorhexidine in a sterile fashion, and a sterile drape was applied covering the operative field. Maximum barrier sterile technique with sterile gowns and gloves were used for the procedure. Local anesthesia was provided with 1% lidocaine. After creating a small venotomy incision, a 21 gauge needle was advanced into the right internal jugular vein under direct, real-time ultrasound guidance. Ultrasound image documentation was performed. After securing guidewire access, an 8 Fr dilator was placed. A J-wire was kinked to measure appropriate catheter length. A subcutaneous port pocket was then created along the upper chest wall utilizing sharp and blunt dissection. Portable  cautery was utilized. The pocket was irrigated with sterile saline. A single lumen power injectable port was chosen for placement. The 8 Fr catheter was tunneled from the port pocket site to the venotomy incision. The port was placed in the pocket. External catheter was trimmed to appropriate length based on guidewire measurement. At the venotomy, an 8 Fr peel-away sheath was placed over a guidewire. The catheter was then placed through the sheath and the sheath removed. Final catheter positioning was confirmed and documented with a fluoroscopic spot image. The port was accessed with a needle and aspirated and flushed with heparinized saline. The access needle was removed. The venotomy and port pocket incisions were closed with subcutaneous 3-0 Monocryl and subcuticular 4-0 Vicryl. Dermabond was applied to both incisions. COMPLICATIONS: COMPLICATIONS None FINDINGS: After catheter placement, the tip lies at the cavo-atrial junction. The catheter aspirates normally and is ready for immediate use. IMPRESSION: Placement of single lumen port a cath via right internal jugular vein. The catheter tip lies at the cavo-atrial junction. A power injectable port a cath was placed and is ready for immediate use. Electronically Signed   By: Aletta Edouard M.D.   On: 03/04/2018 10:15   Ir Fluoro Guide Port Insertion Right  Result Date: 03/04/2018 CLINICAL DATA:  Metastatic small cell lung carcinoma and poor venous access. Port placement has been requested for chemotherapy, IV infusions and blood draws. EXAM: IMPLANTED PORT A CATH PLACEMENT WITH ULTRASOUND AND FLUOROSCOPIC GUIDANCE ANESTHESIA/SEDATION: 4.0 mg IV Versed; 1 mg IV Dilaudid Total Moderate Sedation Time:  36 minutes The patient's level of consciousness and physiologic status were continuously monitored during the procedure by Radiology nursing. Additional Medications: 1 g IV vancomycin. FLUOROSCOPY TIME:  12 seconds.  1.9 mGy. PROCEDURE: The procedure, risks,  benefits, and alternatives were explained to the patient. Questions regarding the procedure were encouraged and answered. The patient understands and consents to the procedure. A time-out was performed prior to initiating the procedure. Ultrasound was utilized to confirm patency of the right internal jugular vein. The right neck and chest were prepped with chlorhexidine in a sterile fashion, and a sterile drape was applied covering the operative field. Maximum barrier sterile technique with sterile gowns and gloves were used for the procedure. Local anesthesia was provided with 1% lidocaine. After creating a small venotomy incision, a 21 gauge needle was  advanced into the right internal jugular vein under direct, real-time ultrasound guidance. Ultrasound image documentation was performed. After securing guidewire access, an 8 Fr dilator was placed. A J-wire was kinked to measure appropriate catheter length. A subcutaneous port pocket was then created along the upper chest wall utilizing sharp and blunt dissection. Portable cautery was utilized. The pocket was irrigated with sterile saline. A single lumen power injectable port was chosen for placement. The 8 Fr catheter was tunneled from the port pocket site to the venotomy incision. The port was placed in the pocket. External catheter was trimmed to appropriate length based on guidewire measurement. At the venotomy, an 8 Fr peel-away sheath was placed over a guidewire. The catheter was then placed through the sheath and the sheath removed. Final catheter positioning was confirmed and documented with a fluoroscopic spot image. The port was accessed with a needle and aspirated and flushed with heparinized saline. The access needle was removed. The venotomy and port pocket incisions were closed with subcutaneous 3-0 Monocryl and subcuticular 4-0 Vicryl. Dermabond was applied to both incisions. COMPLICATIONS: COMPLICATIONS None FINDINGS: After catheter placement, the  tip lies at the cavo-atrial junction. The catheter aspirates normally and is ready for immediate use. IMPRESSION: Placement of single lumen port a cath via right internal jugular vein. The catheter tip lies at the cavo-atrial junction. A power injectable port a cath was placed and is ready for immediate use. Electronically Signed   By: Aletta Edouard M.D.   On: 03/04/2018 10:15     ASSESSMENT/PLAN:  Small cell carcinoma of upper lobe of left lung [facility]) This is a very pleasant 62 year old white female was limited stage small cell lung cancer, status post 6 cycles of systemic chemotherapy with carboplatin and etoposide concurrent with radiation and followed by prophylactic cranial irradiation. Patient has been in observation for more than a year. She has been doing fine but recently started complaining of increasing fatigue and weakness as well as headache. Repeat CT scan of the chest showed concerning findings for disease recurrence in the lung as well as new lesion in the liver. PET scan which was performed recently and unfortunately showed hypermetabolic activity in the mediastinal lymph nodes in addition to the metastatic disease to the liver and bone including L3 bone lesion. Ultrasound-guided core biopsy ofoneof the suspicious liver lesion was consistent with recurrent small cell carcinoma. The patient is now on palliative systemic chemotherapy with carboplatin for AUC of 5 on day 1, etoposide 100 mg/M2 on days 1, 2 and 3 in addition to Tecentriq (Atezolizumab) 1200 mg IV every 3 weeks with Neulasta support.  She is status post 2 cycles.    She is tolerating her treatment fairly well with the exception of fatigue and intermittent nausea which is controlled with antiemetics.  The patient was seen with Dr. Julien Nordmann.  Recommend that she proceed with cycle 3 of her treatment as scheduled.  She was reminded to get weekly labs at [facility].  She is anemic with a hemoglobin of 9.3.  We will consider a  blood transfusion if her hemoglobin drops to 8.0 or less.  The patient will have a restaging CT scan of the chest, abdomen, pelvis prior to cycle #4.  She will follow-up in 3 weeks for evaluation prior to cycle 4.  For anxiety, she may continue Xanax.  For nausea, she may continue Zofran and Phenergan.    The patient voices understanding of current disease status and treatment options and is in  agreement with the current care plan. All questions were answered. The patient knows to call the clinic with any problems, questions or concerns. We can certainly see the patient much sooner if necessary.   Orders Placed This Encounter  Procedures  . CT ABDOMEN PELVIS W CONTRAST    Standing Status:   Future    Standing Expiration Date:   04/02/2019    Order Specific Question:   If indicated for the ordered procedure, I authorize the administration of contrast media per Radiology protocol    Answer:   Yes    Order Specific Question:   Preferred imaging location?    Answer:   [facility]    Order Specific Question:   Radiology Contrast Protocol - do NOT remove file path    Answer:   [email or web address]    Order Specific Question:   Reason for Exam additional comments    Answer:   Extensive stage small cell lung cancer. Restaging.  . CT CHEST W CONTRAST    Standing Status:   Future    Standing Expiration Date:   04/02/2019    Order Specific Question:   If indicated for the ordered procedure, I authorize the administration of contrast media per Radiology protocol    Answer:   Yes    Order Specific Question:   Preferred imaging location?    Answer:   [facility]    Order Specific Question:   Radiology Contrast Protocol - do NOT remove file path    Answer:   [email or web address]    Order Specific Question:   Reason for Exam additional comments    Answer:   Extensive stage small cell lung cancer. Restaging.   Mikey Bussing, DNP,  AGPCNP-BC, AOCNP 04/01/18  ADDENDUM: Hematology/Oncology Attending: I had a face-to-face encounter with the patient.  I recommended his care plan.  This is a very pleasant 62 years old white female with recurrent small cell lung cancer.  She is currently undergoing systemic chemotherapy with carboplatin, etoposide and Tecentriq status post 2 cycles.  She has been tolerating her treatment well except for fatigue secondary to chemotherapy-induced anemia.  I recommended for the patient to proceed with cycle #3 today as a scheduled. I will arrange for the patient to come back for follow-up visit in 3 weeks for evaluation after repeating CT scan of the chest, abdomen and pelvis for restaging of her disease. The patient was advised to call immediately if she has any concerning symptoms in the interval.  Disclaimer: This note was dictated with voice recognition software. Similar sounding words can inadvertently be transcribed and may be missed upon review. Eilleen Kempf, MD 04/01/18

## 2018-04-02 ENCOUNTER — Inpatient Hospital Stay: Payer: Medicare Other

## 2018-04-02 VITALS — BP 111/67 | HR 74 | Temp 97.7°F | Resp 18

## 2018-04-02 DIAGNOSIS — C3412 Malignant neoplasm of upper lobe, left bronchus or lung: Secondary | ICD-10-CM | POA: Diagnosis not present

## 2018-04-02 DIAGNOSIS — C3492 Malignant neoplasm of unspecified part of left bronchus or lung: Secondary | ICD-10-CM

## 2018-04-02 MED ORDER — DEXAMETHASONE SODIUM PHOSPHATE 10 MG/ML IJ SOLN
10.0000 mg | Freq: Once | INTRAMUSCULAR | Status: AC
Start: 2018-04-02 — End: 2018-04-02
  Administered 2018-04-02: 10 mg via INTRAVENOUS

## 2018-04-02 MED ORDER — DEXAMETHASONE SODIUM PHOSPHATE 10 MG/ML IJ SOLN
INTRAMUSCULAR | Status: AC
  Filled 2018-04-02: qty 1

## 2018-04-02 MED ORDER — SODIUM CHLORIDE 0.9% FLUSH
10.0000 mL | INTRAVENOUS | Status: DC | PRN
  Administered 2018-04-02: 10 mL
  Filled 2018-04-02: qty 10

## 2018-04-02 MED ORDER — SODIUM CHLORIDE 0.9 % IV SOLN
Freq: Once | INTRAVENOUS | Status: AC
Start: 2018-04-02 — End: 2018-04-02
  Administered 2018-04-02: 14:00:00 via INTRAVENOUS

## 2018-04-02 MED ORDER — HEPARIN SOD (PORK) LOCK FLUSH 100 UNIT/ML IV SOLN
500.0000 [IU] | Freq: Once | INTRAVENOUS | Status: AC | PRN
  Administered 2018-04-02: 500 [IU]
  Filled 2018-04-02: qty 5

## 2018-04-02 MED ORDER — SODIUM CHLORIDE 0.9 % IV SOLN
100.0000 mg/m2 | Freq: Once | INTRAVENOUS | Status: AC
  Administered 2018-04-02: 180 mg via INTRAVENOUS
  Filled 2018-04-02: qty 9

## 2018-04-02 NOTE — Patient Instructions (Signed)
[facility] Discharge Instructions for Patients Receiving Chemotherapy  Today you received the following chemotherapy agents: Etoposide.  To help prevent nausea and vomiting after your treatment, we encourage you to take your nausea medication as directed.    If you develop nausea and vomiting that is not controlled by your nausea medication, call the clinic.   BELOW ARE SYMPTOMS THAT SHOULD BE REPORTED IMMEDIATELY:  *FEVER GREATER THAN 100.5 F  *CHILLS WITH OR WITHOUT FEVER  NAUSEA AND VOMITING THAT IS NOT CONTROLLED WITH YOUR NAUSEA MEDICATION  *UNUSUAL SHORTNESS OF BREATH  *UNUSUAL BRUISING OR BLEEDING  TENDERNESS IN MOUTH AND THROAT WITH OR WITHOUT PRESENCE OF ULCERS  *URINARY PROBLEMS  *BOWEL PROBLEMS  UNUSUAL RASH Items with * indicate a potential emergency and should be followed up as soon as possible.  Feel free to call the clinic should you have any questions or concerns. The clinic phone number is [phone].  Please show the [place] at check-in to the Emergency Department and triage nurse.

## 2018-04-03 ENCOUNTER — Inpatient Hospital Stay: Payer: Medicare Other

## 2018-04-03 VITALS — BP 130/80 | HR 66 | Temp 98.0°F | Resp 17

## 2018-04-03 DIAGNOSIS — C3412 Malignant neoplasm of upper lobe, left bronchus or lung: Secondary | ICD-10-CM | POA: Diagnosis not present

## 2018-04-03 DIAGNOSIS — C3492 Malignant neoplasm of unspecified part of left bronchus or lung: Secondary | ICD-10-CM

## 2018-04-03 MED ORDER — SODIUM CHLORIDE 0.9% FLUSH
10.0000 mL | INTRAVENOUS | Status: DC | PRN
  Administered 2018-04-03: 10 mL
  Filled 2018-04-03: qty 10

## 2018-04-03 MED ORDER — DEXAMETHASONE SODIUM PHOSPHATE 10 MG/ML IJ SOLN
INTRAMUSCULAR | Status: AC
  Filled 2018-04-03: qty 1

## 2018-04-03 MED ORDER — SODIUM CHLORIDE 0.9 % IV SOLN
100.0000 mg/m2 | Freq: Once | INTRAVENOUS | Status: AC
  Administered 2018-04-03: 180 mg via INTRAVENOUS
  Filled 2018-04-03: qty 9

## 2018-04-03 MED ORDER — HEPARIN SOD (PORK) LOCK FLUSH 100 UNIT/ML IV SOLN
500.0000 [IU] | Freq: Once | INTRAVENOUS | Status: AC | PRN
  Administered 2018-04-03: 500 [IU]
  Filled 2018-04-03: qty 5

## 2018-04-03 MED ORDER — SODIUM CHLORIDE 0.9 % IV SOLN
Freq: Once | INTRAVENOUS | Status: AC
  Administered 2018-04-03: 14:00:00 via INTRAVENOUS

## 2018-04-03 MED ORDER — DEXAMETHASONE SODIUM PHOSPHATE 10 MG/ML IJ SOLN
10.0000 mg | Freq: Once | INTRAMUSCULAR | Status: AC
  Administered 2018-04-03: 10 mg via INTRAVENOUS

## 2018-04-03 MED ORDER — PEGFILGRASTIM 6 MG/0.6ML ~~LOC~~ PSKT
6.0000 mg | PREFILLED_SYRINGE | Freq: Once | SUBCUTANEOUS | Status: AC
  Administered 2018-04-03: 6 mg via SUBCUTANEOUS

## 2018-04-03 MED ORDER — PEGFILGRASTIM 6 MG/0.6ML ~~LOC~~ PSKT
PREFILLED_SYRINGE | SUBCUTANEOUS | Status: AC
Start: 2018-04-03 — End: 2018-04-03
  Filled 2018-04-03: qty 0.6

## 2018-04-03 NOTE — Patient Instructions (Signed)
[place] Discharge Instructions for Patients Receiving Chemotherapy  Today you received the following chemotherapy agents Etoposide.   To help prevent nausea and vomiting after your treatment, we encourage you to take your nausea medication as prescribed.    If you develop nausea and vomiting that is not controlled by your nausea medication, call the clinic.   BELOW ARE SYMPTOMS THAT SHOULD BE REPORTED IMMEDIATELY:  *FEVER GREATER THAN 100.5 F  *CHILLS WITH OR WITHOUT FEVER  NAUSEA AND VOMITING THAT IS NOT CONTROLLED WITH YOUR NAUSEA MEDICATION  *UNUSUAL SHORTNESS OF BREATH  *UNUSUAL BRUISING OR BLEEDING  TENDERNESS IN MOUTH AND THROAT WITH OR WITHOUT PRESENCE OF ULCERS  *URINARY PROBLEMS  *BOWEL PROBLEMS  UNUSUAL RASH Items with * indicate a potential emergency and should be followed up as soon as possible.  Feel free to call the clinic should you have any questions or concerns. The clinic phone number is [phone].  Please show the [facility] at check-in to the Emergency Department and triage nurse.

## 2018-04-04 ENCOUNTER — Ambulatory Visit: Payer: Medicare Other

## 2018-04-06 ENCOUNTER — Encounter (HOSPITAL_COMMUNITY)
Admission: RE | Admit: 2018-04-06 | Discharge: 2018-04-06 | Disposition: A | Discharge status: Home / Self Care | Payer: Medicare Other | Source: Ambulatory Visit | Attending: Internal Medicine | Admitting: Internal Medicine

## 2018-04-06 DIAGNOSIS — C3492 Malignant neoplasm of unspecified part of left bronchus or lung: Secondary | ICD-10-CM | POA: Diagnosis present

## 2018-04-06 LAB — CBC WITH DIFFERENTIAL/PLATELET
BASOS ABS: 0 10*3/uL (ref 0.0–0.1)
Basophils Relative: 0 %
Eosinophils Absolute: 0 10*3/uL (ref 0.0–0.7)
Eosinophils Relative: 0 %
HCT: 28.9 % — ABNORMAL LOW (ref 36.0–46.0)
HEMOGLOBIN: 9.8 g/dL — AB (ref 12.0–15.0)
Lymphocytes Relative: 8 %
Lymphs Abs: 2.2 10*3/uL (ref 0.7–4.0)
MCH: 32.3 pg (ref 26.0–34.0)
MCHC: 33.9 g/dL (ref 30.0–36.0)
MCV: 95.4 fL (ref 78.0–100.0)
MONOS PCT: 1 %
Monocytes Absolute: 0.3 10*3/uL (ref 0.1–1.0)
NEUTROS PCT: 91 %
Neutro Abs: 24.8 10*3/uL — ABNORMAL HIGH (ref 1.7–7.7)
Platelets: 196 10*3/uL (ref 150–400)
RBC: 3.03 MIL/uL — AB (ref 3.87–5.11)
RDW: 16.8 % — AB (ref 11.5–15.5)
WBC: 27.3 10*3/uL — AB (ref 4.0–10.5)

## 2018-04-06 LAB — BASIC METABOLIC PANEL
ANION GAP: 9 (ref 5–15)
BUN: 14 mg/dL (ref 6–20)
CHLORIDE: 98 mmol/L — AB (ref 101–111)
CO2: 29 mmol/L (ref 22–32)
Calcium: 8.2 mg/dL — ABNORMAL LOW (ref 8.9–10.3)
Creatinine, Ser: 0.63 mg/dL (ref 0.44–1.00)
Glucose, Bld: 96 mg/dL (ref 65–99)
POTASSIUM: 3.2 mmol/L — AB (ref 3.5–5.1)
SODIUM: 136 mmol/L (ref 135–145)

## 2018-04-07 ENCOUNTER — Telehealth: Payer: Self-pay | Admitting: *Deleted

## 2018-04-07 ENCOUNTER — Other Ambulatory Visit: Payer: Self-pay | Admitting: *Deleted

## 2018-04-07 MED ORDER — POTASSIUM CHLORIDE CRYS ER 20 MEQ PO TBCR: EXTENDED_RELEASE_TABLET | ORAL | 0 refills | Status: DC

## 2018-04-07 NOTE — Telephone Encounter (Signed)
K+ Rx sent to pt's pharmacy. Pt notified.

## 2018-04-07 NOTE — Telephone Encounter (Signed)
Voicemail received requesting return call in reference to "yesterday's lab results, call from pharmacy for potassium.  I don't understand,  I drink a lot of tomato juice everyday.  Don't want to pick uo potassium if I do not need it."   Message forwarded to collaborative for further Communication with patient.

## 2018-04-08 ENCOUNTER — Telehealth: Payer: Self-pay | Admitting: Medical Oncology

## 2018-04-08 NOTE — Telephone Encounter (Signed)
Claled labs to pt and left message.

## 2018-04-15 ENCOUNTER — Encounter (HOSPITAL_COMMUNITY)
Admission: RE | Admit: 2018-04-15 | Discharge: 2018-04-15 | Disposition: A | Discharge status: Home / Self Care | Payer: Medicare Other | Source: Ambulatory Visit | Attending: Internal Medicine | Admitting: Internal Medicine

## 2018-04-15 DIAGNOSIS — C3492 Malignant neoplasm of unspecified part of left bronchus or lung: Secondary | ICD-10-CM | POA: Diagnosis not present

## 2018-04-15 LAB — CBC WITH DIFFERENTIAL/PLATELET
BASOS ABS: 0 10*3/uL (ref 0.0–0.1)
Basophils Relative: 0 %
Eosinophils Absolute: 0.1 10*3/uL (ref 0.0–0.7)
Eosinophils Relative: 1 %
HCT: 22.7 % — ABNORMAL LOW (ref 36.0–46.0)
Hemoglobin: 7.6 g/dL — ABNORMAL LOW (ref 12.0–15.0)
LYMPHS ABS: 1.7 10*3/uL (ref 0.7–4.0)
Lymphocytes Relative: 29 %
MCH: 32.6 pg (ref 26.0–34.0)
MCHC: 33.5 g/dL (ref 30.0–36.0)
MCV: 97.4 fL (ref 78.0–100.0)
MONO ABS: 0.6 10*3/uL (ref 0.1–1.0)
Monocytes Relative: 10 %
Neutro Abs: 3.3 10*3/uL (ref 1.7–7.7)
Neutrophils Relative %: 60 %
Platelets: 32 10*3/uL — ABNORMAL LOW (ref 150–400)
RBC: 2.33 MIL/uL — AB (ref 3.87–5.11)
RDW: 17.1 % — AB (ref 11.5–15.5)
WBC: 5.7 10*3/uL (ref 4.0–10.5)

## 2018-04-15 LAB — COMPREHENSIVE METABOLIC PANEL
ALBUMIN: 3.6 g/dL (ref 3.5–5.0)
ALT: 9 U/L — ABNORMAL LOW (ref 14–54)
ANION GAP: 7 (ref 5–15)
AST: 14 U/L — ABNORMAL LOW (ref 15–41)
Alkaline Phosphatase: 61 U/L (ref 38–126)
BILIRUBIN TOTAL: 0.7 mg/dL (ref 0.3–1.2)
BUN: 7 mg/dL (ref 6–20)
CO2: 26 mmol/L (ref 22–32)
Calcium: 7.8 mg/dL — ABNORMAL LOW (ref 8.9–10.3)
Chloride: 103 mmol/L (ref 101–111)
Creatinine, Ser: 0.56 mg/dL (ref 0.44–1.00)
GFR calc Af Amer: >60 mL/min (ref >60)
GFR calc non Af Amer: >60 mL/min (ref >60)
GLUCOSE: 96 mg/dL (ref 65–99)
POTASSIUM: 3.8 mmol/L (ref 3.5–5.1)
SODIUM: 136 mmol/L (ref 135–145)
TOTAL PROTEIN: 6.2 g/dL — AB (ref 6.5–8.1)

## 2018-04-16 ENCOUNTER — Other Ambulatory Visit: Payer: Medicare Other

## 2018-04-16 ENCOUNTER — Telehealth: Payer: Self-pay | Admitting: Internal Medicine

## 2018-04-16 NOTE — Telephone Encounter (Signed)
Scheduled patient for appts per 5/16 sch msg - spoke w/ pt and confirmed appt.

## 2018-04-17 ENCOUNTER — Other Ambulatory Visit: Payer: Self-pay | Admitting: *Deleted

## 2018-04-17 ENCOUNTER — Inpatient Hospital Stay: Payer: Medicare Other

## 2018-04-17 ENCOUNTER — Telehealth: Payer: Self-pay

## 2018-04-17 DIAGNOSIS — R5382 Chronic fatigue, unspecified: Secondary | ICD-10-CM

## 2018-04-17 DIAGNOSIS — C3412 Malignant neoplasm of upper lobe, left bronchus or lung: Secondary | ICD-10-CM | POA: Diagnosis not present

## 2018-04-17 DIAGNOSIS — C3492 Malignant neoplasm of unspecified part of left bronchus or lung: Secondary | ICD-10-CM

## 2018-04-17 LAB — CMP (CANCER CENTER ONLY)
ALT: 9 U/L (ref 0–55)
AST: 12 U/L (ref 5–34)
Albumin: 3.8 g/dL (ref 3.5–5.0)
Alkaline Phosphatase: 64 U/L (ref 40–150)
Anion gap: 8 (ref 3–11)
BUN: 9 mg/dL (ref 7–26)
CHLORIDE: 108 mmol/L (ref 98–109)
CO2: 25 mmol/L (ref 22–29)
Calcium: 9 mg/dL (ref 8.4–10.4)
Creatinine: 0.73 mg/dL (ref 0.60–1.10)
GFR, Est AFR Am: >60 mL/min (ref >60)
GFR, Estimated: >60 mL/min (ref >60)
GLUCOSE: 100 mg/dL (ref 70–140)
Potassium: 4 mmol/L (ref 3.5–5.1)
SODIUM: 141 mmol/L (ref 136–145)
Total Bilirubin: 0.5 mg/dL (ref 0.2–1.2)
Total Protein: 6.4 g/dL (ref 6.4–8.3)

## 2018-04-17 LAB — CBC WITH DIFFERENTIAL (CANCER CENTER ONLY)
BASOS PCT: 0 %
Basophils Absolute: 0 10*3/uL (ref 0.0–0.1)
EOS ABS: 0 10*3/uL (ref 0.0–0.5)
Eosinophils Relative: 1 %
HCT: 23.3 % — ABNORMAL LOW (ref 34.8–46.6)
Hemoglobin: 7.9 g/dL — ABNORMAL LOW (ref 11.6–15.9)
Lymphocytes Relative: 16 %
Lymphs Abs: 1.4 10*3/uL (ref 0.9–3.3)
MCH: 32.9 pg (ref 25.1–34.0)
MCHC: 33.9 g/dL (ref 31.5–36.0)
MCV: 97.2 fL (ref 79.5–101.0)
Monocytes Absolute: 0.6 10*3/uL (ref 0.1–0.9)
Monocytes Relative: 7 %
NEUTROS PCT: 76 %
NRBC: 1 /100{WBCs} — AB
Neutro Abs: 6.5 10*3/uL (ref 1.5–6.5)
PLATELETS: 63 10*3/uL — AB (ref 145–400)
RBC: 2.4 MIL/uL — AB (ref 3.70–5.45)
RDW: 18 % — ABNORMAL HIGH (ref 11.2–14.5)
WBC: 8.6 10*3/uL (ref 3.9–10.3)

## 2018-04-17 LAB — PREPARE RBC (CROSSMATCH)

## 2018-04-17 LAB — ABO/RH: ABO/RH(D): O POS

## 2018-04-17 LAB — TSH: TSH: 1.525 u[IU]/mL (ref 0.308–3.960)

## 2018-04-17 MED ORDER — ACETAMINOPHEN 325 MG PO TABS
650.0000 mg | ORAL_TABLET | Freq: Once | ORAL | Status: AC
  Administered 2018-04-17: 650 mg via ORAL

## 2018-04-17 MED ORDER — SODIUM CHLORIDE 0.9% FLUSH
10.0000 mL | INTRAVENOUS | Status: AC | PRN
  Administered 2018-04-17: 10 mL
  Filled 2018-04-17: qty 10

## 2018-04-17 MED ORDER — SODIUM CHLORIDE 0.9 % IV SOLN
250.0000 mL | Freq: Once | INTRAVENOUS | Status: AC
  Administered 2018-04-17: 250 mL via INTRAVENOUS

## 2018-04-17 MED ORDER — HEPARIN SOD (PORK) LOCK FLUSH 100 UNIT/ML IV SOLN
500.0000 [IU] | Freq: Every day | INTRAVENOUS | Status: AC | PRN
  Administered 2018-04-17: 500 [IU]
  Filled 2018-04-17: qty 5

## 2018-04-17 MED ORDER — DIPHENHYDRAMINE HCL 25 MG PO CAPS
25.0000 mg | ORAL_CAPSULE | Freq: Once | ORAL | Status: AC
  Administered 2018-04-17: 25 mg via ORAL

## 2018-04-17 MED ORDER — ACETAMINOPHEN 325 MG PO TABS
ORAL_TABLET | ORAL | Status: AC
  Filled 2018-04-17: qty 2

## 2018-04-17 MED ORDER — DIPHENHYDRAMINE HCL 25 MG PO CAPS
ORAL_CAPSULE | ORAL | Status: AC
  Filled 2018-04-17: qty 1

## 2018-04-17 NOTE — Telephone Encounter (Signed)
Patient called to confirm 5/17 appointment time. Patient informed of lab appointment scheduled for 1130 and infusion appointment at 1230. Patient verbalized understanding.

## 2018-04-17 NOTE — Patient Instructions (Signed)

## 2018-04-18 LAB — BPAM RBC
Blood Product Expiration Date: 201906112359
Blood Product Expiration Date: 201906112359
ISSUE DATE / TIME: 201905171332
ISSUE DATE / TIME: 201905171332
UNIT TYPE AND RH: 5100
Unit Type and Rh: 5100

## 2018-04-18 LAB — TYPE AND SCREEN
ABO/RH(D): O POS
Antibody Screen: NEGATIVE
UNIT DIVISION: 0
Unit division: 0

## 2018-04-20 ENCOUNTER — Ambulatory Visit (HOSPITAL_COMMUNITY)
Admission: RE | Admit: 2018-04-20 | Discharge: 2018-04-20 | Disposition: A | Discharge status: Home / Self Care | Payer: Medicare Other | Source: Ambulatory Visit | Attending: Oncology | Admitting: Oncology

## 2018-04-20 DIAGNOSIS — J439 Emphysema, unspecified: Secondary | ICD-10-CM | POA: Insufficient documentation

## 2018-04-20 DIAGNOSIS — K449 Diaphragmatic hernia without obstruction or gangrene: Secondary | ICD-10-CM | POA: Diagnosis not present

## 2018-04-20 DIAGNOSIS — Z8701 Personal history of pneumonia (recurrent): Secondary | ICD-10-CM | POA: Insufficient documentation

## 2018-04-20 DIAGNOSIS — I700 Atherosclerosis of aorta: Secondary | ICD-10-CM | POA: Diagnosis not present

## 2018-04-20 DIAGNOSIS — C3412 Malignant neoplasm of upper lobe, left bronchus or lung: Secondary | ICD-10-CM | POA: Diagnosis not present

## 2018-04-20 DIAGNOSIS — C787 Secondary malignant neoplasm of liver and intrahepatic bile duct: Secondary | ICD-10-CM | POA: Diagnosis not present

## 2018-04-20 DIAGNOSIS — K5710 Diverticulosis of small intestine without perforation or abscess without bleeding: Secondary | ICD-10-CM | POA: Diagnosis not present

## 2018-04-20 MED ORDER — HEPARIN SOD (PORK) LOCK FLUSH 100 UNIT/ML IV SOLN
INTRAVENOUS | Status: AC
  Filled 2018-04-20: qty 5

## 2018-04-20 MED ORDER — IOPAMIDOL (ISOVUE-300) INJECTION 61%
100.0000 mL | Freq: Once | INTRAVENOUS | Status: AC | PRN
  Administered 2018-04-20: 100 mL via INTRAVENOUS

## 2018-04-20 MED ORDER — HEPARIN SOD (PORK) LOCK FLUSH 100 UNIT/ML IV SOLN
500.0000 [IU] | Freq: Once | INTRAVENOUS | Status: AC
  Administered 2018-04-20: 500 [IU]

## 2018-04-20 MED ORDER — IOPAMIDOL (ISOVUE-300) INJECTION 61%
INTRAVENOUS | Status: AC
  Filled 2018-04-20: qty 100

## 2018-04-21 ENCOUNTER — Inpatient Hospital Stay: Payer: Medicare Other

## 2018-04-21 ENCOUNTER — Other Ambulatory Visit: Payer: Medicare Other

## 2018-04-21 ENCOUNTER — Other Ambulatory Visit: Payer: Self-pay

## 2018-04-21 ENCOUNTER — Telehealth: Payer: Self-pay | Admitting: Oncology

## 2018-04-21 ENCOUNTER — Inpatient Hospital Stay (HOSPITAL_BASED_OUTPATIENT_CLINIC_OR_DEPARTMENT_OTHER): Payer: Medicare Other | Admitting: Oncology

## 2018-04-21 ENCOUNTER — Encounter: Payer: Self-pay | Admitting: Oncology

## 2018-04-21 VITALS — BP 121/73 | HR 66 | Temp 97.6°F | Resp 17 | Ht 64.0 in | Wt 167.2 lb

## 2018-04-21 DIAGNOSIS — R110 Nausea: Secondary | ICD-10-CM

## 2018-04-21 DIAGNOSIS — F411 Generalized anxiety disorder: Secondary | ICD-10-CM | POA: Diagnosis not present

## 2018-04-21 DIAGNOSIS — C3492 Malignant neoplasm of unspecified part of left bronchus or lung: Secondary | ICD-10-CM

## 2018-04-21 DIAGNOSIS — C787 Secondary malignant neoplasm of liver and intrahepatic bile duct: Secondary | ICD-10-CM

## 2018-04-21 DIAGNOSIS — C7951 Secondary malignant neoplasm of bone: Secondary | ICD-10-CM

## 2018-04-21 DIAGNOSIS — Z79899 Other long term (current) drug therapy: Secondary | ICD-10-CM

## 2018-04-21 DIAGNOSIS — R112 Nausea with vomiting, unspecified: Secondary | ICD-10-CM

## 2018-04-21 DIAGNOSIS — E039 Hypothyroidism, unspecified: Secondary | ICD-10-CM

## 2018-04-21 DIAGNOSIS — C3432 Malignant neoplasm of lower lobe, left bronchus or lung: Secondary | ICD-10-CM

## 2018-04-21 DIAGNOSIS — E860 Dehydration: Secondary | ICD-10-CM

## 2018-04-21 DIAGNOSIS — C3412 Malignant neoplasm of upper lobe, left bronchus or lung: Secondary | ICD-10-CM | POA: Diagnosis not present

## 2018-04-21 DIAGNOSIS — Z5111 Encounter for antineoplastic chemotherapy: Secondary | ICD-10-CM

## 2018-04-21 DIAGNOSIS — Z5112 Encounter for antineoplastic immunotherapy: Secondary | ICD-10-CM

## 2018-04-21 LAB — CMP (CANCER CENTER ONLY)
ALBUMIN: 3.8 g/dL (ref 3.5–5.0)
ALK PHOS: 53 U/L (ref 40–150)
ALT: 8 U/L (ref 0–55)
AST: 13 U/L (ref 5–34)
Anion gap: 8 (ref 3–11)
BILIRUBIN TOTAL: 0.6 mg/dL (ref 0.2–1.2)
BUN: 6 mg/dL — AB (ref 7–26)
CALCIUM: 8.6 mg/dL (ref 8.4–10.4)
CO2: 26 mmol/L (ref 22–29)
CREATININE: 0.71 mg/dL (ref 0.60–1.10)
Chloride: 106 mmol/L (ref 98–109)
GFR, Estimated: >60 mL/min (ref >60)
GLUCOSE: 89 mg/dL (ref 70–140)
Potassium: 4.3 mmol/L (ref 3.5–5.1)
Sodium: 140 mmol/L (ref 136–145)
TOTAL PROTEIN: 6.4 g/dL (ref 6.4–8.3)

## 2018-04-21 LAB — CBC WITH DIFFERENTIAL (CANCER CENTER ONLY)
Basophils Absolute: 0 10*3/uL (ref 0.0–0.1)
Basophils Relative: 0 %
EOS ABS: 0 10*3/uL (ref 0.0–0.5)
EOS PCT: 1 %
HEMATOCRIT: 33.2 % — AB (ref 34.8–46.6)
HEMOGLOBIN: 11 g/dL — AB (ref 11.6–15.9)
LYMPHS ABS: 1.2 10*3/uL (ref 0.9–3.3)
Lymphocytes Relative: 24 %
MCH: 31.9 pg (ref 25.1–34.0)
MCHC: 33.1 g/dL (ref 31.5–36.0)
MCV: 96.2 fL (ref 79.5–101.0)
Monocytes Absolute: 0.6 10*3/uL (ref 0.1–0.9)
Monocytes Relative: 13 %
NEUTROS PCT: 62 %
Neutro Abs: 3.1 10*3/uL (ref 1.5–6.5)
Platelet Count: 96 10*3/uL — ABNORMAL LOW (ref 145–400)
RBC: 3.45 MIL/uL — ABNORMAL LOW (ref 3.70–5.45)
RDW: 19.7 % — ABNORMAL HIGH (ref 11.2–14.5)
WBC: 5 10*3/uL (ref 3.9–10.3)
nRBC: 1 /100 WBC — ABNORMAL HIGH

## 2018-04-21 MED ORDER — SODIUM CHLORIDE 0.9% FLUSH
10.0000 mL | INTRAVENOUS | Status: DC | PRN
Start: 2018-04-21 — End: 2018-04-21
  Filled 2018-04-21: qty 10

## 2018-04-21 MED ORDER — SODIUM CHLORIDE 0.9 % IV SOLN
Freq: Once | INTRAVENOUS | Status: AC
  Administered 2018-04-21: 11:00:00 via INTRAVENOUS

## 2018-04-21 MED ORDER — FAMOTIDINE IN NACL 20-0.9 MG/50ML-% IV SOLN
20.0000 mg | Freq: Once | INTRAVENOUS | Status: AC
  Administered 2018-04-21: 20 mg via INTRAVENOUS

## 2018-04-21 MED ORDER — DIPHENHYDRAMINE HCL 50 MG/ML IJ SOLN
25.0000 mg | Freq: Once | INTRAMUSCULAR | Status: AC
  Administered 2018-04-21: 25 mg via INTRAVENOUS

## 2018-04-21 MED ORDER — SODIUM CHLORIDE 0.9 % IV SOLN
547.5000 mg | Freq: Once | INTRAVENOUS | Status: AC
  Administered 2018-04-21: 550 mg via INTRAVENOUS
  Filled 2018-04-21: qty 55

## 2018-04-21 MED ORDER — PALONOSETRON HCL INJECTION 0.25 MG/5ML
INTRAVENOUS | Status: AC
  Filled 2018-04-21: qty 5

## 2018-04-21 MED ORDER — FAMOTIDINE IN NACL 20-0.9 MG/50ML-% IV SOLN
INTRAVENOUS | Status: AC
  Filled 2018-04-21: qty 50

## 2018-04-21 MED ORDER — DEXAMETHASONE SODIUM PHOSPHATE 10 MG/ML IJ SOLN
INTRAMUSCULAR | Status: AC
  Filled 2018-04-21: qty 1

## 2018-04-21 MED ORDER — SODIUM CHLORIDE 0.9 % IV SOLN
1200.0000 mg | Freq: Once | INTRAVENOUS | Status: AC
  Administered 2018-04-21: 1200 mg via INTRAVENOUS
  Filled 2018-04-21: qty 20

## 2018-04-21 MED ORDER — DIPHENHYDRAMINE HCL 50 MG/ML IJ SOLN
INTRAMUSCULAR | Status: AC
  Filled 2018-04-21: qty 1

## 2018-04-21 MED ORDER — SODIUM CHLORIDE 0.9% FLUSH
10.0000 mL | INTRAVENOUS | Status: DC | PRN
  Administered 2018-04-21: 10 mL
  Filled 2018-04-21: qty 10

## 2018-04-21 MED ORDER — SODIUM CHLORIDE 0.9 % IV SOLN
100.0000 mg/m2 | Freq: Once | INTRAVENOUS | Status: AC
  Administered 2018-04-21: 180 mg via INTRAVENOUS
  Filled 2018-04-21: qty 9

## 2018-04-21 MED ORDER — HEPARIN SOD (PORK) LOCK FLUSH 100 UNIT/ML IV SOLN
500.0000 [IU] | Freq: Once | INTRAVENOUS | Status: DC | PRN
  Filled 2018-04-21: qty 5

## 2018-04-21 MED ORDER — DEXAMETHASONE SODIUM PHOSPHATE 10 MG/ML IJ SOLN
10.0000 mg | Freq: Once | INTRAMUSCULAR | Status: AC
  Administered 2018-04-21: 10 mg via INTRAVENOUS

## 2018-04-21 MED ORDER — PALONOSETRON HCL INJECTION 0.25 MG/5ML
0.2500 mg | Freq: Once | INTRAVENOUS | Status: AC
  Administered 2018-04-21: 0.25 mg via INTRAVENOUS

## 2018-04-21 MED ORDER — ONDANSETRON 4 MG PO TBDP: ORAL_TABLET | ORAL | 1 refills | Status: DC

## 2018-04-21 NOTE — Progress Notes (Signed)
[facility] OFFICE PROGRESS NOTE  Redmond School, MD [address] [place] [address]  DIAGNOSIS: Metastatic small cell lung cancer initially diagnosed asLimited stage (T1b, N2, M0) small cell lung cancer presented with left lower lobe/infrahilar mass and large mediastinal lymphadenopathy diagnosed in October 2017.  PRIOR THERAPY: 1) Systemic chemotherapy with cisplatin 60 MG/M2 on day 1 and etoposide 120 MG/M2 on days 1, 2 and 3 status post 1 cycle. This was discontinued secondary to intolerance. 2) Systemic chemotherapy with carboplatin for AUC of 4 on day 1 and etoposide 100 MG/M2 on days 1, 2 and 3 with Neulasta support on day 4 every 3 weeks. Status post 5 cycles. This was concurrent with radiation in [place], [place]. 3) prophylactic cranial irradiation.  CURRENT THERAPY: Systemic chemotherapy with carboplatin for AUC of 5 on day 1, etoposide 100 mg/M2 on days 1, 2 and 3 as well as a Tecentriq (Atezolizumab) 1200 mg IV every 3 weeks with Neulasta support. First dose February 17, 2018.Status post 3 cycles.  INTERVAL HISTORY: DARIAH MCSORLEY 62 y.o. female returns for a for routine follow-up visit accompanied by her friend.  The patient is feeling fine today with exception of fatigue.  The patient received 2 units of packed red blood cells last week for hemoglobin of 7.9.  Fatigue has improved slightly since the transfusion.  The patient denies fevers and chills.  Denies chest pain, cough, hemoptysis.  She has her baseline shortness of breath.  She has intermittent nausea which is well controlled with antiemetics.  Denies vomiting, constipation, diarrhea.  Denies recent weight loss or night sweats.  No skin rashes.    Patient had a restaging CT scan of the chest, abdomen, pelvis and is here to discuss the results.   MEDICAL HISTORY: Past Medical History:  Diagnosis Date  . 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 Anxiety    takes Prozac daily  .  Arthritis   . Benign fundic gland polyps of stomach   . Colon polyps   . COPD (chronic obstructive pulmonary disease) ([place])   . Dehydration 03/06/2017  . Diverticulitis   . Dyspnea    with exertion  . Encounter for antineoplastic chemotherapy 12/03/2016  . Fibromyalgia   . GERD (gastroesophageal reflux disease)    takes Pantoprazole daily  . Hypertension    takes Metoprolol,Triamterene-HCTZ,and Amlodipine daily  . Hypothyroidism    takes Synthroid daily  . IBS (irritable bowel syndrome)   . lung ca dx'd 10/02/2016   skin, lung  . PONV (postoperative nausea and vomiting)    pt also states that she had some difficulty breathing after cervical fusion    ALLERGIES:  is allergic to penicillins; codeine; fentanyl; ranitidine hcl; keflex [cephalexin]; and lyrica [pregabalin].  MEDICATIONS:  Current Outpatient Medications  Medication Sig Dispense Refill  . albuterol (PROVENTIL HFA;VENTOLIN HFA) 108 (90 Base) MCG/ACT inhaler Inhale 2 puffs into the lungs every 4 (four) hours as needed for wheezing or shortness of breath.    . ALPRAZolam (XANAX) 0.25 MG tablet Take 2 tablets (0.5 mg total) by mouth at bedtime as needed for anxiety. 30 tablet 0  . amLODipine (NORVASC) 5 MG tablet Take 5 mg by mouth daily.      Marland Kitchen dicyclomine (BENTYL) 20 MG tablet Take 20 mg by mouth 3 (three) times daily as needed for spasms.    Marland Kitchen estazolam (PROSOM) 2 MG tablet Take 2 mg by mouth at bedtime.    Marland Kitchen estradiol (ESTRACE) 2 MG tablet Take  2 mg by mouth daily.      Marland Kitchen FLUoxetine (PROZAC) 40 MG capsule Take 40 mg by mouth daily.   2  . HYDROcodone-acetaminophen (NORCO/VICODIN) 5-325 MG tablet Take 1 tablet by mouth every 4 (four) hours as needed for moderate pain.   0  . levothyroxine (SYNTHROID, LEVOTHROID) 50 MCG tablet Take 50 mcg by mouth daily before breakfast.     . lidocaine-prilocaine (EMLA) cream Apply 1 application topically as needed. To numb skin over port a cath: Squeeze a  small amount on cotton ball and  place over port site 1-2 hours prior to chemotherapy. 30 g 0  . loperamide (IMODIUM) 2 MG capsule Take 2 mg by mouth every 2 (two) hours as needed for diarrhea or loose stools.    . metoprolol succinate (TOPROL-XL) 25 MG 24 hr tablet Take 25 mg by mouth daily.    . ondansetron (ZOFRAN-ODT) 4 MG disintegrating tablet DISSOLVE 1 TABLET BY MOUTH EVERY 8 HOURS AS NEEDED FOR NAUSEA AND VOMITING. 20 tablet 1  . pantoprazole (PROTONIX) 40 MG tablet Take 40 mg by mouth 2 (two) times daily before a meal.   10  . potassium chloride SA (K-DUR,KLOR-CON) 20 MEQ tablet Take 1 tablet twice a day for 7 days then 1 tablet daily. 30 tablet 0  . promethazine (PHENERGAN) 25 MG tablet Take 1 tablet (25 mg total) by mouth every 6 (six) hours as needed. 30 tablet 10  . senna-docusate (SENOKOT-S) 8.6-50 MG tablet Take 1 tablet by mouth daily as needed.     No current facility-administered medications for this visit.    Facility-Administered Medications Ordered in Other Visits  Medication Dose Route Frequency Provider Last Rate Last Dose  . CARBOplatin (PARAPLATIN) 550 mg in sodium chloride 0.9 % 250 mL chemo infusion  550 mg Intravenous Once Curt Bears, MD 610 mL/hr at 04/21/18 1254 550 mg at 04/21/18 1254  . etoposide (VEPESID) 180 mg in sodium chloride 0.9 % 500 mL chemo infusion  100 mg/m2 (Treatment Plan Recorded) Intravenous Once Curt Bears, MD      . heparin lock flush 100 unit/mL  500 Units Intracatheter Once PRN Curt Bears, MD      . sodium chloride flush (NS) 0.9 % injection 10 mL  10 mL Intracatheter PRN Curt Bears, MD        SURGICAL HISTORY:  Past Surgical History:  Procedure Laterality Date  . BIOPSY N/A 05/25/2013   Procedure: BIOPSIES (Random Colon; Duodenal; Gastric);  Surgeon: Danie Binder, MD;  Location: [facility];  Service: Endoscopy;  Laterality: N/A;  . BLADDER SUSPENSION    . BREAST ENHANCEMENT SURGERY    . BREAST IMPLANT REMOVAL    . CERVICAL FUSION  AUG 2013  .  CHOLECYSTECTOMY  1999  . COLONOSCOPY  2007 [facility]   POLYPS  . COLONOSCOPY WITH PROPOFOL N/A 05/25/2013   Procedure: COLONOSCOPY WITH PROPOFOL(at cecum 0957) total withdrawal time=65min);  Surgeon: Danie Binder, MD;  Location: [facility];  Service: Endoscopy;  Laterality: N/A;  . ESOPHAGOGASTRODUODENOSCOPY (EGD) WITH PROPOFOL N/A 05/25/2013   Procedure: ESOPHAGOGASTRODUODENOSCOPY (EGD) WITH PROPOFOL;  Surgeon: Danie Binder, MD;  Location: [facility];  Service: Endoscopy;  Laterality: N/A;  . FOOT SURGERY    . IR FLUORO GUIDE PORT INSERTION RIGHT  03/04/2018  . IR US GUIDE VASC ACCESS RIGHT  03/04/2018  . POLYPECTOMY N/A 05/25/2013   Procedure: POLYPECTOMY (Rectal and Gastric);  Surgeon: Danie Binder, MD;  Location: [facility];  Service: Endoscopy;  Laterality:  N/A;  . TONSILLECTOMY    . UPPER GASTROINTESTINAL ENDOSCOPY    . VIDEO BRONCHOSCOPY WITH ENDOBRONCHIAL ULTRASOUND  09/12/2016   Procedure: VIDEO BRONCHOSCOPY WITH ENDOBRONCHIAL ULTRASOUND AND BIOPSY;  Surgeon: Juanito Doom, MD;  Location: [facility];  Service: Cardiopulmonary;;    REVIEW OF SYSTEMS:   Review of Systems  Constitutional: Negative for appetite change, chills, fever and unexpected weight change. Positive for fatigue. HENT:   Negative for mouth sores, nosebleeds, sore throat and trouble swallowing.   Eyes: Negative for eye problems and icterus.  Respiratory: Negative for cough, hemoptysis, and wheezing.  She has her baseline shortness of breath.  Cardiovascular: Negative for chest pain and leg swelling.  Gastrointestinal: Negative for abdominal pain, constipation, diarrhea, nausea and vomiting.  Genitourinary: Negative for bladder incontinence, difficulty urinating, dysuria, frequency and hematuria.   Musculoskeletal: Negative for back pain, gait problem, neck pain and neck stiffness.  Skin: Negative for itching and rash.  Neurological: Negative for dizziness, extremity weakness, gait problem, headaches, light-headedness and  seizures.  Hematological: Negative for adenopathy. Does not bruise/bleed easily.  Psychiatric/Behavioral: Negative for confusion, depression and sleep disturbance. The patient is not nervous/anxious.     PHYSICAL EXAMINATION:  Blood pressure 121/73, pulse 66, temperature 97.6 F (36.4 C), temperature source Oral, resp. rate 17, height 5\' 4"  (1.626 m), weight 167 lb 3.2 oz (75.8 kg), SpO2 93 %.  ECOG PERFORMANCE STATUS: 1 - Symptomatic but completely ambulatory  Physical Exam  Constitutional: Oriented to person, place, and time and well-developed, well-nourished, and in no distress. No distress.  HENT:  Head: Normocephalic and atraumatic.  Mouth/Throat: Oropharynx is clear and moist. No oropharyngeal exudate.  Eyes: Conjunctivae are normal. Right eye exhibits no discharge. Left eye exhibits no discharge. No scleral icterus.  Neck: Normal range of motion. Neck supple.  Cardiovascular: Normal rate, regular rhythm, normal heart sounds and intact distal pulses.   Pulmonary/Chest: Effort normal and breath sounds normal. No respiratory distress. No wheezes. No rales.  Abdominal: Soft. Bowel sounds are normal. Exhibits no distension and no mass. There is no tenderness.  Musculoskeletal: Normal range of motion. Exhibits no edema.  Lymphadenopathy:    No cervical adenopathy.  Neurological: Alert and oriented to person, place, and time. Exhibits normal muscle tone. Gait normal. Coordination normal.  Skin: Skin is warm and dry. No rash noted. Not diaphoretic. No erythema. No pallor.  Psychiatric: Mood, memory and judgment normal.  Vitals reviewed.  LABORATORY DATA: Lab Results  Component Value Date   WBC 5.0 04/21/2018   HGB 11.0 (L) 04/21/2018   HCT 33.2 (L) 04/21/2018   MCV 96.2 04/21/2018   PLT 96 (L) 04/21/2018      Chemistry      Component Value Date/Time   NA 140 04/21/2018 1005   NA 141 09/08/2017 1009   K 4.3 04/21/2018 1005   K 3.5 09/08/2017 1009   CL 106 04/21/2018 1005    CO2 26 04/21/2018 1005   CO2 29 09/08/2017 1009   BUN 6 (L) 04/21/2018 1005   BUN 5.8 (L) 09/08/2017 1009   CREATININE 0.71 04/21/2018 1005   CREATININE 0.7 09/08/2017 1009      Component Value Date/Time   CALCIUM 8.6 04/21/2018 1005   CALCIUM 9.0 09/08/2017 1009   ALKPHOS 53 04/21/2018 1005   ALKPHOS 49 09/08/2017 1009   AST 13 04/21/2018 1005   AST 9 09/08/2017 1009   ALT 8 04/21/2018 1005   ALT <6 09/08/2017 1009   BILITOT 0.6 04/21/2018 1005  BILITOT 0.51 09/08/2017 1009       RADIOGRAPHIC STUDIES:  Ct Chest W Contrast  Result Date: 04/20/2018 CLINICAL DATA:  Left upper lobe lung cancer diagnosed in 2017. Chemotherapy and radiation therapy. Ongoing chemotherapy. Bilateral leg pain. Hysterectomy. Cholecystectomy. EXAM: CT CHEST, ABDOMEN, AND PELVIS WITH CONTRAST TECHNIQUE: Multidetector CT imaging of the chest, abdomen and pelvis was performed following the standard protocol during bolus administration of intravenous contrast. CONTRAST:  153mL ISOVUE-300 IOPAMIDOL (ISOVUE-300) INJECTION 61% 100 cc of Isovue-300 COMPARISON:  02/09/2018 PET. 01/30/2018 chest CT. 02/06/2018 abdominal CT. FINDINGS: CT CHEST FINDINGS Cardiovascular: Right-sided Port-A-Cath terminates at the high right atrium. Aortic atherosclerosis. Normal heart size. Minimal anterior pericardial fluid or thickening is slightly increased. No central pulmonary embolism, on this non-dedicated study. Mediastinum/Nodes: No supraclavicular adenopathy. Decrease in soft tissue mass centered in the AP window region. Example at 3.0 X 3.1 cm on image 18/2. Compare 4.5 x 3.1 cm on the prior exam. No hilar adenopathy. Moderate hiatal hernia. Lungs/Pleura: Trace left-sided pleural fluid. Mild centrilobular emphysema. Right lower lobe airspace disease is resolved. Lateral left lower lobe clustered nodules are similar, including on image 77/4. Central left upper lobe and medial left infrahilar/left lower lobe radiation fibrosis is not  significantly changed. Musculoskeletal: Lower cervical spine fixation. CT ABDOMEN PELVIS FINDINGS Hepatobiliary: Mild motion degradation in the upper abdomen. Suspect mild hepatic steatosis. Segment 3 liver lesion is somewhat ill-defined but appears decreased. 1.9 x 1.9 cm on image 54/2 versus 4.3 x 3.0 cm on the prior diagnostic CT. No new liver lesions. Cholecystectomy. Borderline intrahepatic duct dilatation is similar. Common duct normal in caliber at 8 mm. Pancreas: The pancreatic duct is also borderline prominent, without cause identified. No acute pancreatitis. Spleen: Normal in size, without focal abnormality. Adrenals/Urinary Tract: Normal adrenal glands. Upper pole right renal cyst. Normal left kidney. Normal urinary bladder. Stomach/Bowel: Normal remainder of the stomach. Periampullary duodenal diverticulum. Extensive colonic diverticulosis. Normal terminal ileum. Otherwise normal small bowel. Vascular/Lymphatic: Aortic and branch vessel atherosclerosis. No abdominopelvic adenopathy. Reproductive: Hysterectomy.  No adnexal mass. Other: No significant free fluid. No evidence of omental or peritoneal disease. Musculoskeletal: New L3 sclerotic lesion of 1.8 cm. This is at the site of a FDG avid lesion on the prior exam. Degenerate disc disease at multiple lumbosacral levels. IMPRESSION: 1. Response to therapy, as evidenced by decreased size of AP window recurrence and hepatic metastasis. 2. No new or progressive disease. 3. New sclerotic focus of the L3 vertebral body, the site of hypermetabolism on prior PET. This is most consistent with healing and response to therapy. 4. Resolved right lower lobe pneumonia. 5. Chronic hiatal hernia and tiny pericardial effusion. 6. Aortic atherosclerosis (ICD10-I70.0) and emphysema (ICD10-J43.9). Electronically Signed   By: Abigail Miyamoto M.D.   On: 04/20/2018 13:52   Ct Abdomen Pelvis W Contrast  Result Date: 04/20/2018 CLINICAL DATA:  Left upper lobe lung cancer  diagnosed in 2017. Chemotherapy and radiation therapy. Ongoing chemotherapy. Bilateral leg pain. Hysterectomy. Cholecystectomy. EXAM: CT CHEST, ABDOMEN, AND PELVIS WITH CONTRAST TECHNIQUE: Multidetector CT imaging of the chest, abdomen and pelvis was performed following the standard protocol during bolus administration of intravenous contrast. CONTRAST:  195mL ISOVUE-300 IOPAMIDOL (ISOVUE-300) INJECTION 61% 100 cc of Isovue-300 COMPARISON:  02/09/2018 PET. 01/30/2018 chest CT. 02/06/2018 abdominal CT. FINDINGS: CT CHEST FINDINGS Cardiovascular: Right-sided Port-A-Cath terminates at the high right atrium. Aortic atherosclerosis. Normal heart size. Minimal anterior pericardial fluid or thickening is slightly increased. No central pulmonary embolism, on this non-dedicated study. Mediastinum/Nodes: No supraclavicular  adenopathy. Decrease in soft tissue mass centered in the AP window region. Example at 3.0 X 3.1 cm on image 18/2. Compare 4.5 x 3.1 cm on the prior exam. No hilar adenopathy. Moderate hiatal hernia. Lungs/Pleura: Trace left-sided pleural fluid. Mild centrilobular emphysema. Right lower lobe airspace disease is resolved. Lateral left lower lobe clustered nodules are similar, including on image 77/4. Central left upper lobe and medial left infrahilar/left lower lobe radiation fibrosis is not significantly changed. Musculoskeletal: Lower cervical spine fixation. CT ABDOMEN PELVIS FINDINGS Hepatobiliary: Mild motion degradation in the upper abdomen. Suspect mild hepatic steatosis. Segment 3 liver lesion is somewhat ill-defined but appears decreased. 1.9 x 1.9 cm on image 54/2 versus 4.3 x 3.0 cm on the prior diagnostic CT. No new liver lesions. Cholecystectomy. Borderline intrahepatic duct dilatation is similar. Common duct normal in caliber at 8 mm. Pancreas: The pancreatic duct is also borderline prominent, without cause identified. No acute pancreatitis. Spleen: Normal in size, without focal abnormality.  Adrenals/Urinary Tract: Normal adrenal glands. Upper pole right renal cyst. Normal left kidney. Normal urinary bladder. Stomach/Bowel: Normal remainder of the stomach. Periampullary duodenal diverticulum. Extensive colonic diverticulosis. Normal terminal ileum. Otherwise normal small bowel. Vascular/Lymphatic: Aortic and branch vessel atherosclerosis. No abdominopelvic adenopathy. Reproductive: Hysterectomy.  No adnexal mass. Other: No significant free fluid. No evidence of omental or peritoneal disease. Musculoskeletal: New L3 sclerotic lesion of 1.8 cm. This is at the site of a FDG avid lesion on the prior exam. Degenerate disc disease at multiple lumbosacral levels. IMPRESSION: 1. Response to therapy, as evidenced by decreased size of AP window recurrence and hepatic metastasis. 2. No new or progressive disease. 3. New sclerotic focus of the L3 vertebral body, the site of hypermetabolism on prior PET. This is most consistent with healing and response to therapy. 4. Resolved right lower lobe pneumonia. 5. Chronic hiatal hernia and tiny pericardial effusion. 6. Aortic atherosclerosis (ICD10-I70.0) and emphysema (ICD10-J43.9). Electronically Signed   By: Abigail Miyamoto M.D.   On: 04/20/2018 13:52     ASSESSMENT/PLAN:  Small cell lung cancer, left ([place]) This is a very pleasant 62year old white female was limited stage small cell lung cancer, status post 6 cycles of systemic chemotherapy with carboplatin and etoposide concurrent with radiation and followed by prophylactic cranial irradiation. Patient has been in observation for more than a year. She has been doing fine but recently started complaining of increasing fatigue and weakness as well as headache. Repeat CT scan of the chest showed concerning findings for disease recurrence in the lung as well as new lesion in the liver. PET scan which was performed recently and unfortunately showed hypermetabolic activity in the mediastinal lymph nodes in addition  to the metastatic disease to the liver and bone including L3 bone lesion. Ultrasound-guided core biopsy ofoneof the suspicious liver lesion was consistent with recurrent small cell carcinoma. The patient is now onpalliative systemic chemotherapy with carboplatin for AUC of 5 on day 1, etoposide 100 mg/M2 on days 1, 2 and 3 in addition to Tecentriq (Atezolizumab) 1200 mg IV every 3 weeks with Neulasta support.She is status post 2 cycles.   She is tolerating her treatment fairly well with the exception of fatigue and intermittent nausea which is controlled with antiemetics.  The patient was seen with Dr. Julien Nordmann.  Scans reviewed and show response to treatment.  Recommend for her to continue on treatment with carboplatin for an AUC of 5 on day 1, etoposide 100 mg meter squared on days 1, 2, and 3  in addition to Tecentriq 1200 mg IV every 3 weeks with Neulasta support.  She will proceed with cycle 4 of her treatment as scheduled today. Platelet count is 96,000. She has no bleeding.  Okay to proceed with treatment as scheduled today. She will follow-up in 3 weeks for evaluation prior to cycle #5.  Continue weekly labs at [facility].  For anxiety,she may continueXanax.  For nausea,she may continue Zofran and Phenergan.Refill of Zofran was provided today.  Her hemoglobin has improved to 11.0.  We will continue to watch this.  Consider a blood transfusion if her hemoglobin drops to 8.0 or less.  The patient voices understanding of current disease status and treatment options and is in agreement with the current care plan. All questions were answered. The patient knows to call the clinic with any problems, questions or concerns. We can certainly see the patient much sooner if necessary.   No orders of the defined types were placed in this encounter.  Mikey Bussing, DNP, AGPCNP-BC, AOCNP 04/21/18  ADDENDUM: Hematology/Oncology Attending: I had a face-to-face encounter with the  patient.  I recommended her care plan.  This is a very pleasant 62 years old white female with recurrent and metastatic small cell lung cancer.  She is currently undergoing systemic chemotherapy with carboplatin, etoposide and Tecentriq status post 3 cycles.  The patient has been tolerating this treatment well with no concerning complaints. The patient had repeat CT scan of the chest, abdomen and pelvis performed recently.  I personally and independently reviewed the scans and discussed the results with the patient today.  Her scan showed improvement of her disease after the first 3 cycles of her treatment with decrease in the lung and liver metastatic disease. I recommended for the patient to continue her current treatment and she will proceed with cycle #4 today. She will come back for follow-up visit in 3 weeks for evaluation before starting cycle #5. The patient was advised to call immediately if she has any concerning symptoms in the interval.  Disclaimer: This note was dictated with voice recognition software. Similar sounding words can inadvertently be transcribed and may be missed upon review. Eilleen Kempf, MD 04/22/18

## 2018-04-21 NOTE — Patient Instructions (Signed)
[facility] Discharge Instructions for Patients Receiving Chemotherapy  Today you received the following chemotherapy agents:  Tecentriq, Carboplatin, Etoposide  To help prevent nausea and vomiting after your treatment, we encourage you to take your nausea medication as prescribed.   If you develop nausea and vomiting that is not controlled by your nausea medication, call the clinic.   BELOW ARE SYMPTOMS THAT SHOULD BE REPORTED IMMEDIATELY:  *FEVER GREATER THAN 100.5 F  *CHILLS WITH OR WITHOUT FEVER  NAUSEA AND VOMITING THAT IS NOT CONTROLLED WITH YOUR NAUSEA MEDICATION  *UNUSUAL SHORTNESS OF BREATH  *UNUSUAL BRUISING OR BLEEDING  TENDERNESS IN MOUTH AND THROAT WITH OR WITHOUT PRESENCE OF ULCERS  *URINARY PROBLEMS  *BOWEL PROBLEMS  UNUSUAL RASH Items with * indicate a potential emergency and should be followed up as soon as possible.  Feel free to call the clinic should you have any questions or concerns. The clinic phone number is [phone].  Please show the [facility] at check-in to the Emergency Department and triage nurse.

## 2018-04-21 NOTE — Assessment & Plan Note (Addendum)
This is a very pleasant 63year old white female was limited stage small cell lung cancer, status post 6 cycles of systemic chemotherapy with carboplatin and etoposide concurrent with radiation and followed by prophylactic cranial irradiation. Patient has been in observation for more than a year. She has been doing fine but recently started complaining of increasing fatigue and weakness as well as headache. Repeat CT scan of the chest showed concerning findings for disease recurrence in the lung as well as new lesion in the liver. PET scan which was performed recently and unfortunately showed hypermetabolic activity in the mediastinal lymph nodes in addition to the metastatic disease to the liver and bone including L3 bone lesion. Ultrasound-guided core biopsy ofoneof the suspicious liver lesion was consistent with recurrent small cell carcinoma. The patient is now onpalliative systemic chemotherapy with carboplatin for AUC of 5 on day 1, etoposide 100 mg/M2 on days 1, 2 and 3 in addition to Tecentriq (Atezolizumab) 1200 mg IV every 3 weeks with Neulasta support.She is status post 2 cycles.   She is tolerating her treatment fairly well with the exception of fatigue and intermittent nausea which is controlled with antiemetics.  The patient was seen with Dr. Julien Nordmann.  Scans reviewed and show response to treatment.  Recommend for her to continue on treatment with carboplatin for an AUC of 5 on day 1, etoposide 100 mg meter squared on days 1, 2, and 3 in addition to Tecentriq 1200 mg IV every 3 weeks with Neulasta support.  She will proceed with cycle 4 of her treatment as scheduled today. Platelet count is 96,000. She has no bleeding.  Okay to proceed with treatment as scheduled today. She will follow-up in 3 weeks for evaluation prior to cycle #5.  Continue [facility] at [facility].  For anxiety,she may continueXanax.  For nausea,she may continue Zofran and Phenergan.Refill of  Zofran was provided today.  Her hemoglobin has improved to 11.0.  We will continue to watch this.  Consider a blood transfusion if her hemoglobin drops to 8.0 or less.  The patient voices understanding of current disease status and treatment options and is in agreement with the current care plan. All questions were answered. The patient knows to call the clinic with any problems, questions or concerns. We can certainly see the patient much sooner if necessary.

## 2018-04-21 NOTE — Telephone Encounter (Signed)
Appts already scheduled per 5/21 los.

## 2018-04-21 NOTE — Progress Notes (Signed)
Ok to treat with today's labs including platelets of 96 per Erasmo Downer, NP.  Cyndia Bent RN

## 2018-04-21 NOTE — Patient Instructions (Signed)
Implanted Port Insertion, Care After °This sheet gives you information about how to care for yourself after your procedure. Your health care provider may also give you more specific instructions. If you have problems or questions, contact your health care provider. °What can I expect after the procedure? °After your procedure, it is common to have: °· Discomfort at the port insertion site. °· Bruising on the skin over the port. This should improve over 3-4 days. ° °Follow these instructions at home: °Port care °· After your port is placed, you will get a manufacturer's information card. The card has information about your port. Keep this card with you at all times. °· Take care of the port as told by your health care provider. Ask your health care provider if you or a family member can get training for taking care of the port at home. A home health care nurse may also take care of the port. °· Make sure to remember what type of port you have. °Incision care °· Follow instructions from your health care provider about how to take care of your port insertion site. Make sure you: °? Wash your hands with soap and water before you change your bandage (dressing). If soap and water are not available, use hand sanitizer. °? Change your dressing as told by your health care provider. °? Leave stitches (sutures), skin glue, or adhesive strips in place. These skin closures may need to stay in place for 2 weeks or longer. If adhesive strip edges start to loosen and curl up, you may trim the loose edges. Do not remove adhesive strips completely unless your health care provider tells you to do that. °· Check your port insertion site every day for signs of infection. Check for: °? More redness, swelling, or pain. °? More fluid or blood. °? Warmth. °? Pus or a bad smell. °General instructions °· Do not take baths, swim, or use a hot tub until your health care provider approves. °· Do not lift anything that is heavier than 10 lb (4.5  kg) for a week, or as told by your health care provider. °· Ask your health care provider when it is okay to: °? Return to work or school. °? Resume usual physical activities or sports. °· Do not drive for 24 hours if you were given a medicine to help you relax (sedative). °· Take over-the-counter and prescription medicines only as told by your health care provider. °· Wear a medical alert bracelet in case of an emergency. This will tell any health care providers that you have a port. °· Keep all follow-up visits as told by your health care provider. This is important. °Contact a health care provider if: °· You cannot flush your port with saline as directed, or you cannot draw blood from the port. °· You have a fever or chills. °· You have more redness, swelling, or pain around your port insertion site. °· You have more fluid or blood coming from your port insertion site. °· Your port insertion site feels warm to the touch. °· You have pus or a bad smell coming from the port insertion site. °Get help right away if: °· You have chest pain or shortness of breath. °· You have bleeding from your port that you cannot control. °Summary °· Take care of the port as told by your health care provider. °· Change your dressing as told by your health care provider. °· Keep all follow-up visits as told by your health care provider. °  This information is not intended to replace advice given to you by your health care provider. Make sure you discuss any questions you have with your health care provider. °Document Released: 09/08/2013 Document Revised: 10/09/2016 Document Reviewed: 10/09/2016 °Elsevier Interactive Patient Education © 2017 [employer] ° °

## 2018-04-22 ENCOUNTER — Inpatient Hospital Stay: Payer: Medicare Other

## 2018-04-22 VITALS — BP 111/54 | HR 57 | Temp 98.0°F | Resp 17

## 2018-04-22 DIAGNOSIS — C3492 Malignant neoplasm of unspecified part of left bronchus or lung: Secondary | ICD-10-CM

## 2018-04-22 DIAGNOSIS — C3412 Malignant neoplasm of upper lobe, left bronchus or lung: Secondary | ICD-10-CM | POA: Diagnosis not present

## 2018-04-22 MED ORDER — ETOPOSIDE CHEMO INJECTION 1 GM/50ML
100.0000 mg/m2 | Freq: Once | INTRAVENOUS | Status: AC
  Administered 2018-04-22: 180 mg via INTRAVENOUS
  Filled 2018-04-22: qty 9

## 2018-04-22 MED ORDER — DEXAMETHASONE SODIUM PHOSPHATE 10 MG/ML IJ SOLN
10.0000 mg | Freq: Once | INTRAMUSCULAR | Status: AC
  Administered 2018-04-22: 10 mg via INTRAVENOUS

## 2018-04-22 MED ORDER — HEPARIN SOD (PORK) LOCK FLUSH 100 UNIT/ML IV SOLN
500.0000 [IU] | Freq: Once | INTRAVENOUS | Status: AC | PRN
  Administered 2018-04-22: 500 [IU]
  Filled 2018-04-22: qty 5

## 2018-04-22 MED ORDER — DEXAMETHASONE SODIUM PHOSPHATE 10 MG/ML IJ SOLN
INTRAMUSCULAR | Status: AC
  Filled 2018-04-22: qty 1

## 2018-04-22 MED ORDER — SODIUM CHLORIDE 0.9 % IV SOLN
Freq: Once | INTRAVENOUS | Status: AC
  Administered 2018-04-22: 14:00:00 via INTRAVENOUS

## 2018-04-22 MED ORDER — SODIUM CHLORIDE 0.9% FLUSH
10.0000 mL | INTRAVENOUS | Status: DC | PRN
  Administered 2018-04-22: 10 mL
  Filled 2018-04-22: qty 10

## 2018-04-22 NOTE — Patient Instructions (Signed)
[facility] Discharge Instructions for Patients Receiving Chemotherapy  Today you received the following chemotherapy agents:  Etoposide  To help prevent nausea and vomiting after your treatment, we encourage you to take your nausea medication as prescribed.   If you develop nausea and vomiting that is not controlled by your nausea medication, call the clinic.   BELOW ARE SYMPTOMS THAT SHOULD BE REPORTED IMMEDIATELY:  *FEVER GREATER THAN 100.5 F  *CHILLS WITH OR WITHOUT FEVER  NAUSEA AND VOMITING THAT IS NOT CONTROLLED WITH YOUR NAUSEA MEDICATION  *UNUSUAL SHORTNESS OF BREATH  *UNUSUAL BRUISING OR BLEEDING  TENDERNESS IN MOUTH AND THROAT WITH OR WITHOUT PRESENCE OF ULCERS  *URINARY PROBLEMS  *BOWEL PROBLEMS  UNUSUAL RASH Items with * indicate a potential emergency and should be followed up as soon as possible.  Feel free to call the clinic should you have any questions or concerns. The clinic phone number is [phone].  Please show the [facility] at check-in to the Emergency Department and triage nurse.

## 2018-04-23 ENCOUNTER — Inpatient Hospital Stay: Payer: Medicare Other

## 2018-04-23 VITALS — BP 116/62 | HR 58 | Temp 98.0°F | Resp 16

## 2018-04-23 DIAGNOSIS — C3412 Malignant neoplasm of upper lobe, left bronchus or lung: Secondary | ICD-10-CM | POA: Diagnosis not present

## 2018-04-23 DIAGNOSIS — C3492 Malignant neoplasm of unspecified part of left bronchus or lung: Secondary | ICD-10-CM

## 2018-04-23 MED ORDER — SODIUM CHLORIDE 0.9% FLUSH
10.0000 mL | INTRAVENOUS | Status: DC | PRN
  Administered 2018-04-23: 10 mL
  Filled 2018-04-23: qty 10

## 2018-04-23 MED ORDER — DEXAMETHASONE SODIUM PHOSPHATE 10 MG/ML IJ SOLN
10.0000 mg | Freq: Once | INTRAMUSCULAR | Status: AC
  Administered 2018-04-23: 10 mg via INTRAVENOUS

## 2018-04-23 MED ORDER — HEPARIN SOD (PORK) LOCK FLUSH 100 UNIT/ML IV SOLN
500.0000 [IU] | Freq: Once | INTRAVENOUS | Status: AC | PRN
  Administered 2018-04-23: 500 [IU]
  Filled 2018-04-23: qty 5

## 2018-04-23 MED ORDER — PEGFILGRASTIM 6 MG/0.6ML ~~LOC~~ PSKT
PREFILLED_SYRINGE | SUBCUTANEOUS | Status: AC
  Filled 2018-04-23: qty 0.6

## 2018-04-23 MED ORDER — SODIUM CHLORIDE 0.9 % IV SOLN
100.0000 mg/m2 | Freq: Once | INTRAVENOUS | Status: AC
  Administered 2018-04-23: 180 mg via INTRAVENOUS
  Filled 2018-04-23: qty 9

## 2018-04-23 MED ORDER — PEGFILGRASTIM 6 MG/0.6ML ~~LOC~~ PSKT
6.0000 mg | PREFILLED_SYRINGE | Freq: Once | SUBCUTANEOUS | Status: AC
  Administered 2018-04-23: 6 mg via SUBCUTANEOUS

## 2018-04-23 MED ORDER — DEXAMETHASONE SODIUM PHOSPHATE 10 MG/ML IJ SOLN
INTRAMUSCULAR | Status: AC
  Filled 2018-04-23: qty 1

## 2018-04-23 MED ORDER — SODIUM CHLORIDE 0.9 % IV SOLN
Freq: Once | INTRAVENOUS | Status: AC
  Administered 2018-04-23: 15:00:00 via INTRAVENOUS

## 2018-04-23 NOTE — Patient Instructions (Signed)
[facility] Discharge Instructions for Patients Receiving Chemotherapy  Today you received the following chemotherapy agents Etoposide.   To help prevent nausea and vomiting after your treatment, we encourage you to take your nausea medication as directed.   If you develop nausea and vomiting that is not controlled by your nausea medication, call the clinic.   BELOW ARE SYMPTOMS THAT SHOULD BE REPORTED IMMEDIATELY:  *FEVER GREATER THAN 100.5 F  *CHILLS WITH OR WITHOUT FEVER  NAUSEA AND VOMITING THAT IS NOT CONTROLLED WITH YOUR NAUSEA MEDICATION  *UNUSUAL SHORTNESS OF BREATH  *UNUSUAL BRUISING OR BLEEDING  TENDERNESS IN MOUTH AND THROAT WITH OR WITHOUT PRESENCE OF ULCERS  *URINARY PROBLEMS  *BOWEL PROBLEMS  UNUSUAL RASH Items with * indicate a potential emergency and should be followed up as soon as possible.  Feel free to call the clinic should you have any questions or concerns. The clinic phone number is [phone].  Please show the [facility] at check-in to the Emergency Department and triage nurse.

## 2018-04-28 ENCOUNTER — Other Ambulatory Visit: Payer: Self-pay | Admitting: Internal Medicine

## 2018-04-28 ENCOUNTER — Other Ambulatory Visit (HOSPITAL_COMMUNITY)
Admission: RE | Admit: 2018-04-28 | Discharge: 2018-04-28 | Disposition: A | Discharge status: Home / Self Care | Payer: Medicare Other | Source: Ambulatory Visit | Attending: Internal Medicine | Admitting: Internal Medicine

## 2018-04-28 DIAGNOSIS — C3492 Malignant neoplasm of unspecified part of left bronchus or lung: Secondary | ICD-10-CM | POA: Insufficient documentation

## 2018-04-28 LAB — CBC WITH DIFFERENTIAL/PLATELET
BASOS PCT: 0 %
Basophils Absolute: 0 10*3/uL (ref 0.0–0.1)
EOS ABS: 0 10*3/uL (ref 0.0–0.7)
Eosinophils Relative: 1 %
HCT: 31.1 % — ABNORMAL LOW (ref 36.0–46.0)
Hemoglobin: 10.4 g/dL — ABNORMAL LOW (ref 12.0–15.0)
Lymphocytes Relative: 23 %
Lymphs Abs: 1.1 10*3/uL (ref 0.7–4.0)
MCH: 31.8 pg (ref 26.0–34.0)
MCHC: 33.4 g/dL (ref 30.0–36.0)
MCV: 95.1 fL (ref 78.0–100.0)
MONOS PCT: 8 %
Monocytes Absolute: 0.4 10*3/uL (ref 0.1–1.0)
NEUTROS PCT: 68 %
Neutro Abs: 3.3 10*3/uL (ref 1.7–7.7)
PLATELETS: 76 10*3/uL — AB (ref 150–400)
RBC: 3.27 MIL/uL — ABNORMAL LOW (ref 3.87–5.11)
RDW: 17.2 % — ABNORMAL HIGH (ref 11.5–15.5)
WBC: 4.9 10*3/uL (ref 4.0–10.5)

## 2018-04-28 LAB — COMPREHENSIVE METABOLIC PANEL
ALT: 10 U/L — ABNORMAL LOW (ref 14–54)
ANION GAP: 10 (ref 5–15)
AST: 13 U/L — ABNORMAL LOW (ref 15–41)
Albumin: 3.5 g/dL (ref 3.5–5.0)
Alkaline Phosphatase: 59 U/L (ref 38–126)
BUN: 10 mg/dL (ref 6–20)
CHLORIDE: 101 mmol/L (ref 101–111)
CO2: 26 mmol/L (ref 22–32)
Calcium: 8.3 mg/dL — ABNORMAL LOW (ref 8.9–10.3)
Creatinine, Ser: 0.6 mg/dL (ref 0.44–1.00)
GFR calc non Af Amer: >60 mL/min (ref >60)
Glucose, Bld: 94 mg/dL (ref 65–99)
Potassium: 3.5 mmol/L (ref 3.5–5.1)
SODIUM: 137 mmol/L (ref 135–145)
Total Bilirubin: 1.8 mg/dL — ABNORMAL HIGH (ref 0.3–1.2)
Total Protein: 6.5 g/dL (ref 6.5–8.1)

## 2018-05-01 DIAGNOSIS — R0902 Hypoxemia: Secondary | ICD-10-CM | POA: Diagnosis not present

## 2018-05-01 DIAGNOSIS — J449 Chronic obstructive pulmonary disease, unspecified: Secondary | ICD-10-CM | POA: Diagnosis not present

## 2018-05-05 ENCOUNTER — Encounter (HOSPITAL_COMMUNITY)
Admission: RE | Admit: 2018-05-05 | Discharge: 2018-05-05 | Disposition: A | Discharge status: Home / Self Care | Payer: Medicare Other | Source: Ambulatory Visit | Attending: Internal Medicine | Admitting: Internal Medicine

## 2018-05-05 ENCOUNTER — Telehealth: Payer: Self-pay | Admitting: *Deleted

## 2018-05-05 DIAGNOSIS — C3492 Malignant neoplasm of unspecified part of left bronchus or lung: Secondary | ICD-10-CM | POA: Insufficient documentation

## 2018-05-05 LAB — CBC WITH DIFFERENTIAL/PLATELET
BASOS ABS: 0 10*3/uL (ref 0.0–0.1)
Basophils Relative: 0 %
EOS PCT: 1 %
Eosinophils Absolute: 0 10*3/uL (ref 0.0–0.7)
HEMATOCRIT: 27 % — AB (ref 36.0–46.0)
Hemoglobin: 9.1 g/dL — ABNORMAL LOW (ref 12.0–15.0)
Lymphocytes Relative: 35 %
Lymphs Abs: 1.7 10*3/uL (ref 0.7–4.0)
MCH: 31.7 pg (ref 26.0–34.0)
MCHC: 33.7 g/dL (ref 30.0–36.0)
MCV: 94.1 fL (ref 78.0–100.0)
MONO ABS: 0.5 10*3/uL (ref 0.1–1.0)
Monocytes Relative: 11 %
Neutro Abs: 2.6 10*3/uL (ref 1.7–7.7)
Neutrophils Relative %: 53 %
Platelets: 23 10*3/uL — CL (ref 150–400)
RBC: 2.87 MIL/uL — AB (ref 3.87–5.11)
RDW: 16.5 % — AB (ref 11.5–15.5)
WBC: 4.8 10*3/uL (ref 4.0–10.5)

## 2018-05-05 LAB — BASIC METABOLIC PANEL
Anion gap: 10 (ref 5–15)
BUN: 7 mg/dL (ref 6–20)
CHLORIDE: 107 mmol/L (ref 101–111)
CO2: 24 mmol/L (ref 22–32)
Calcium: 8.5 mg/dL — ABNORMAL LOW (ref 8.9–10.3)
Creatinine, Ser: 0.54 mg/dL (ref 0.44–1.00)
GFR calc Af Amer: >60 mL/min (ref >60)
GFR calc non Af Amer: >60 mL/min (ref >60)
Glucose, Bld: 100 mg/dL — ABNORMAL HIGH (ref 65–99)
POTASSIUM: 3.6 mmol/L (ref 3.5–5.1)
SODIUM: 141 mmol/L (ref 135–145)

## 2018-05-05 NOTE — Telephone Encounter (Signed)
Lab results reviewed with MD, called pt to discuss bleeding precautions. Unable to reach pt. LMOVM for pt to call office.

## 2018-05-07 ENCOUNTER — Telehealth: Payer: Self-pay | Admitting: Medical Oncology

## 2018-05-07 LAB — HEPATIC FUNCTION PANEL
ALT: 11 U/L — ABNORMAL LOW (ref 14–54)
AST: 19 U/L (ref 15–41)
Albumin: 4 g/dL (ref 3.5–5.0)
Alkaline Phosphatase: 56 U/L (ref 38–126)
BILIRUBIN DIRECT: 0.1 mg/dL (ref 0.1–0.5)
BILIRUBIN INDIRECT: 0.8 mg/dL (ref 0.3–0.9)
BILIRUBIN TOTAL: 0.9 mg/dL (ref 0.3–1.2)
Total Protein: 6.6 g/dL (ref 6.5–8.1)

## 2018-05-07 NOTE — Telephone Encounter (Signed)
Reviewed labs again with pt and reinforced bleeding precautions. She voiced understanding.

## 2018-05-13 ENCOUNTER — Encounter: Payer: Self-pay | Admitting: Oncology

## 2018-05-13 ENCOUNTER — Telehealth: Payer: Self-pay | Admitting: Oncology

## 2018-05-13 ENCOUNTER — Inpatient Hospital Stay: Payer: Medicare Other | Attending: Internal Medicine

## 2018-05-13 ENCOUNTER — Inpatient Hospital Stay (HOSPITAL_BASED_OUTPATIENT_CLINIC_OR_DEPARTMENT_OTHER): Payer: Medicare Other | Admitting: Oncology

## 2018-05-13 ENCOUNTER — Inpatient Hospital Stay: Payer: Medicare Other

## 2018-05-13 VITALS — BP 128/80 | HR 81 | Temp 97.8°F | Resp 17 | Ht 64.0 in | Wt 165.4 lb

## 2018-05-13 DIAGNOSIS — R112 Nausea with vomiting, unspecified: Secondary | ICD-10-CM | POA: Diagnosis not present

## 2018-05-13 DIAGNOSIS — R110 Nausea: Secondary | ICD-10-CM | POA: Insufficient documentation

## 2018-05-13 DIAGNOSIS — Z5189 Encounter for other specified aftercare: Secondary | ICD-10-CM | POA: Insufficient documentation

## 2018-05-13 DIAGNOSIS — C7951 Secondary malignant neoplasm of bone: Secondary | ICD-10-CM | POA: Diagnosis not present

## 2018-05-13 DIAGNOSIS — F419 Anxiety disorder, unspecified: Secondary | ICD-10-CM | POA: Insufficient documentation

## 2018-05-13 DIAGNOSIS — R5382 Chronic fatigue, unspecified: Secondary | ICD-10-CM

## 2018-05-13 DIAGNOSIS — C3412 Malignant neoplasm of upper lobe, left bronchus or lung: Secondary | ICD-10-CM | POA: Insufficient documentation

## 2018-05-13 DIAGNOSIS — C3492 Malignant neoplasm of unspecified part of left bronchus or lung: Secondary | ICD-10-CM

## 2018-05-13 DIAGNOSIS — C787 Secondary malignant neoplasm of liver and intrahepatic bile duct: Secondary | ICD-10-CM | POA: Diagnosis not present

## 2018-05-13 DIAGNOSIS — E860 Dehydration: Secondary | ICD-10-CM

## 2018-05-13 DIAGNOSIS — Z5112 Encounter for antineoplastic immunotherapy: Secondary | ICD-10-CM | POA: Insufficient documentation

## 2018-05-13 DIAGNOSIS — C3432 Malignant neoplasm of lower lobe, left bronchus or lung: Secondary | ICD-10-CM

## 2018-05-13 DIAGNOSIS — Z5111 Encounter for antineoplastic chemotherapy: Secondary | ICD-10-CM | POA: Diagnosis not present

## 2018-05-13 LAB — CBC WITH DIFFERENTIAL (CANCER CENTER ONLY)
Basophils Absolute: 0 10*3/uL (ref 0.0–0.1)
Basophils Relative: 1 %
EOS ABS: 0 10*3/uL (ref 0.0–0.5)
EOS PCT: 1 %
HCT: 27.5 % — ABNORMAL LOW (ref 34.8–46.6)
Hemoglobin: 9.3 g/dL — ABNORMAL LOW (ref 11.6–15.9)
LYMPHS ABS: 1 10*3/uL (ref 0.9–3.3)
LYMPHS PCT: 25 %
MCH: 32.8 pg (ref 25.1–34.0)
MCHC: 33.7 g/dL (ref 31.5–36.0)
MCV: 97.4 fL (ref 79.5–101.0)
MONO ABS: 0.6 10*3/uL (ref 0.1–0.9)
MONOS PCT: 15 %
Neutro Abs: 2.4 10*3/uL (ref 1.5–6.5)
Neutrophils Relative %: 58 %
PLATELETS: 120 10*3/uL — AB (ref 145–400)
RBC: 2.82 MIL/uL — ABNORMAL LOW (ref 3.70–5.45)
RDW: 21.9 % — AB (ref 11.2–14.5)
WBC: 4.1 10*3/uL (ref 3.9–10.3)

## 2018-05-13 LAB — CMP (CANCER CENTER ONLY)
ALBUMIN: 3.9 g/dL (ref 3.5–5.0)
AST: 13 U/L (ref 5–34)
Alkaline Phosphatase: 57 U/L (ref 40–150)
Anion gap: 12 — ABNORMAL HIGH (ref 3–11)
BUN: 6 mg/dL — AB (ref 7–26)
CHLORIDE: 104 mmol/L (ref 98–109)
CO2: 26 mmol/L (ref 22–29)
CREATININE: 0.69 mg/dL (ref 0.60–1.10)
Calcium: 9 mg/dL (ref 8.4–10.4)
GFR, Est AFR Am: >60 mL/min (ref >60)
GLUCOSE: 96 mg/dL (ref 70–140)
Potassium: 3.6 mmol/L (ref 3.5–5.1)
Sodium: 142 mmol/L (ref 136–145)
Total Bilirubin: 0.6 mg/dL (ref 0.2–1.2)
Total Protein: 6.4 g/dL (ref 6.4–8.3)

## 2018-05-13 LAB — TSH: TSH: 1.169 u[IU]/mL (ref 0.308–3.960)

## 2018-05-13 MED ORDER — SODIUM CHLORIDE 0.9 % IV SOLN
1200.0000 mg | Freq: Once | INTRAVENOUS | Status: AC
  Administered 2018-05-13: 1200 mg via INTRAVENOUS
  Filled 2018-05-13: qty 20

## 2018-05-13 MED ORDER — FAMOTIDINE IN NACL 20-0.9 MG/50ML-% IV SOLN
INTRAVENOUS | Status: AC
Start: 2018-05-13 — End: ?
  Filled 2018-05-13: qty 50

## 2018-05-13 MED ORDER — PALONOSETRON HCL INJECTION 0.25 MG/5ML
0.2500 mg | Freq: Once | INTRAVENOUS | Status: AC
  Administered 2018-05-13: 0.25 mg via INTRAVENOUS

## 2018-05-13 MED ORDER — SODIUM CHLORIDE 0.9 % IV SOLN
90.0000 mg/m2 | Freq: Once | INTRAVENOUS | Status: AC
  Administered 2018-05-13: 170 mg via INTRAVENOUS
  Filled 2018-05-13: qty 8.5

## 2018-05-13 MED ORDER — DIPHENHYDRAMINE HCL 50 MG/ML IJ SOLN
25.0000 mg | Freq: Once | INTRAMUSCULAR | Status: AC
  Administered 2018-05-13: 25 mg via INTRAVENOUS

## 2018-05-13 MED ORDER — DIPHENHYDRAMINE HCL 50 MG/ML IJ SOLN
INTRAMUSCULAR | Status: AC
  Filled 2018-05-13: qty 1

## 2018-05-13 MED ORDER — HEPARIN SOD (PORK) LOCK FLUSH 100 UNIT/ML IV SOLN
500.0000 [IU] | Freq: Once | INTRAVENOUS | Status: AC | PRN
  Administered 2018-05-13: 500 [IU]
  Filled 2018-05-13: qty 5

## 2018-05-13 MED ORDER — DEXAMETHASONE SODIUM PHOSPHATE 10 MG/ML IJ SOLN
10.0000 mg | Freq: Once | INTRAMUSCULAR | Status: AC
  Administered 2018-05-13: 10 mg via INTRAVENOUS

## 2018-05-13 MED ORDER — CARBOPLATIN CHEMO INJECTION 600 MG/60ML
438.0000 mg | Freq: Once | INTRAVENOUS | Status: AC
  Administered 2018-05-13: 440 mg via INTRAVENOUS
  Filled 2018-05-13: qty 44

## 2018-05-13 MED ORDER — SODIUM CHLORIDE 0.9 % IV SOLN
Freq: Once | INTRAVENOUS | Status: AC
  Administered 2018-05-13: 10:00:00 via INTRAVENOUS

## 2018-05-13 MED ORDER — ONDANSETRON 4 MG PO TBDP: ORAL_TABLET | ORAL | 1 refills | Status: DC

## 2018-05-13 MED ORDER — SODIUM CHLORIDE 0.9% FLUSH
10.0000 mL | INTRAVENOUS | Status: DC | PRN
  Administered 2018-05-13: 10 mL
  Filled 2018-05-13: qty 10

## 2018-05-13 MED ORDER — DEXAMETHASONE SODIUM PHOSPHATE 10 MG/ML IJ SOLN: 10.0000 mg | Freq: Once | INTRAMUSCULAR | Status: DC

## 2018-05-13 MED ORDER — DEXAMETHASONE SODIUM PHOSPHATE 10 MG/ML IJ SOLN
INTRAMUSCULAR | Status: AC
  Filled 2018-05-13: qty 1

## 2018-05-13 MED ORDER — FAMOTIDINE IN NACL 20-0.9 MG/50ML-% IV SOLN
20.0000 mg | Freq: Once | INTRAVENOUS | Status: AC
  Administered 2018-05-13: 20 mg via INTRAVENOUS

## 2018-05-13 MED ORDER — PALONOSETRON HCL INJECTION 0.25 MG/5ML
INTRAVENOUS | Status: AC
Start: 2018-05-13 — End: ?
  Filled 2018-05-13: qty 5

## 2018-05-13 NOTE — Progress Notes (Signed)
[facility] OFFICE PROGRESS NOTE  Redmond School, MD [address] [place] [address]  DIAGNOSIS: Metastatic small cell lung cancer initially diagnosed asLimited stage (T1b, N2, M0) small cell lung cancer presented with left lower lobe/infrahilar mass and large mediastinal lymphadenopathy diagnosed in October 2017.  PRIOR THERAPY: 1) Systemic chemotherapy with cisplatin 60 MG/M2 on day 1 and etoposide 120 MG/M2 on days 1, 2 and 3 status post 1 cycle. This was discontinued secondary to intolerance. 2) Systemic chemotherapy with carboplatin for AUC of 4 on day 1 and etoposide 100 MG/M2 on days 1, 2 and 3 with Neulasta support on day 4 every 3 weeks. Status post 5 cycles. This was concurrent with radiation in [place], [place]. 3) prophylactic cranial irradiation.  CURRENT THERAPY: Systemic chemotherapy with carboplatin for AUC of 5 on day 1, etoposide 100 mg/M2 on days 1, 2 and 3 as well as a Tecentriq (Atezolizumab) 1200 mg IV every 3 weeks with Neulasta support. First dose February 17, 2018.Carboplatin was dose reduced to an AUC of 4 and etoposide was decreased to 100 mg meter squared beginning with cycle 5. Status post4cycles.    INTERVAL HISTORY: Rebekah Henderson 62 y.o. female returns for routine follow-up visit by herself.  The patient reports that she been having more fatigue since her last cycle of chemotherapy.  She is also been having more nausea and taking her Zofran on a more regular basis.  She has noticed a rash to her chest and upper back.  She has not had any vomiting.  The patient denies fevers and chills.  Denies chest pain, shortness of breath, cough, hemoptysis.  Denies constipation and diarrhea.  She reports that her appetite is decreased a little bit and she has lost weight since her last visit.  The patient is here for evaluation prior to cycle 5 of her treatment.  MEDICAL HISTORY: Past Medical History:  Diagnosis Date  . Antineoplastic  chemotherapy induced anemia 12/03/2016  . Anxiety    takes Prozac daily  . Arthritis   . Benign fundic gland polyps of stomach   . Colon polyps   . COPD (chronic obstructive pulmonary disease) ([place])   . Dehydration 03/06/2017  . Diverticulitis   . Dyspnea    with exertion  . Encounter for antineoplastic chemotherapy 12/03/2016  . Fibromyalgia   . GERD (gastroesophageal reflux disease)    takes Pantoprazole daily  . Hypertension    takes Metoprolol,Triamterene-HCTZ,and Amlodipine daily  . Hypothyroidism    takes Synthroid daily  . IBS (irritable bowel syndrome)   . lung ca dx'd 10/02/2016   skin, lung  . PONV (postoperative nausea and vomiting)    pt also states that she had some difficulty breathing after cervical fusion    ALLERGIES:  is allergic to penicillins; codeine; fentanyl; ranitidine hcl; keflex [cephalexin]; and lyrica [pregabalin].  MEDICATIONS:  Current Outpatient Medications  Medication Sig Dispense Refill  . albuterol (PROVENTIL HFA;VENTOLIN HFA) 108 (90 Base) MCG/ACT inhaler Inhale 2 puffs into the lungs every 4 (four) hours as needed for wheezing or shortness of breath.    . ALPRAZolam (XANAX) 0.25 MG tablet Take 2 tablets (0.5 mg total) by mouth at bedtime as needed for anxiety. 30 tablet 0  . amLODipine (NORVASC) 5 MG tablet Take 5 mg by mouth daily.      Marland Kitchen dicyclomine (BENTYL) 20 MG tablet Take 20 mg by mouth 3 (three) times daily as needed for spasms.    Marland Kitchen estazolam (PROSOM) 2 MG  tablet Take 2 mg by mouth at bedtime.    Marland Kitchen estradiol (ESTRACE) 2 MG tablet Take 2 mg by mouth daily.      Marland Kitchen FeFum-FePoly-FA-B Cmp-C-Biot (INTEGRA PLUS) CAPS TAKE 1 CAPSULE BY MOUTH ONCE DAILY IN THE MORNING. 30 capsule 0  . FLUoxetine (PROZAC) 40 MG capsule Take 40 mg by mouth daily.   2  . HYDROcodone-acetaminophen (NORCO/VICODIN) 5-325 MG tablet Take 1 tablet by mouth every 4 (four) hours as needed for moderate pain.   0  . levothyroxine (SYNTHROID, LEVOTHROID) 50 MCG tablet Take 50  mcg by mouth daily before breakfast.     . lidocaine-prilocaine (EMLA) cream Apply 1 application topically as needed. To numb skin over port a cath: Squeeze a  small amount on cotton ball and place over port site 1-2 hours prior to chemotherapy. 30 g 0  . loperamide (IMODIUM) 2 MG capsule Take 2 mg by mouth every 2 (two) hours as needed for diarrhea or loose stools.    . metoprolol succinate (TOPROL-XL) 25 MG 24 hr tablet Take 25 mg by mouth daily.    . ondansetron (ZOFRAN-ODT) 4 MG disintegrating tablet DISSOLVE 1 TABLET BY MOUTH EVERY 8 HOURS AS NEEDED FOR NAUSEA AND VOMITING. 20 tablet 1  . pantoprazole (PROTONIX) 40 MG tablet Take 40 mg by mouth 2 (two) times daily before a meal.   10  . potassium chloride SA (K-DUR,KLOR-CON) 20 MEQ tablet Take 1 tablet twice a day for 7 days then 1 tablet daily. 30 tablet 0  . promethazine (PHENERGAN) 25 MG tablet Take 1 tablet (25 mg total) by mouth every 6 (six) hours as needed. 30 tablet 10  . senna-docusate (SENOKOT-S) 8.6-50 MG tablet Take 1 tablet by mouth daily as needed.     No current facility-administered medications for this visit.    Facility-Administered Medications Ordered in Other Visits  Medication Dose Route Frequency Provider Last Rate Last Dose  . etoposide (VEPESID) 170 mg in sodium chloride 0.9 % 500 mL chemo infusion  90 mg/m2 (Treatment Plan Recorded) Intravenous Once Curt Bears, MD      . heparin lock flush 100 unit/mL  500 Units Intracatheter Once PRN Curt Bears, MD      . sodium chloride flush (NS) 0.9 % injection 10 mL  10 mL Intracatheter PRN Curt Bears, MD        SURGICAL HISTORY:  Past Surgical History:  Procedure Laterality Date  . BIOPSY N/A 05/25/2013   Procedure: BIOPSIES (Random Colon; Duodenal; Gastric);  Surgeon: Danie Binder, MD;  Location: [facility];  Service: Endoscopy;  Laterality: N/A;  . BLADDER SUSPENSION    . BREAST ENHANCEMENT SURGERY    . BREAST IMPLANT REMOVAL    . CERVICAL FUSION  AUG  2013  . CHOLECYSTECTOMY  1999  . COLONOSCOPY  2007 [place]   POLYPS  . COLONOSCOPY WITH PROPOFOL N/A 05/25/2013   Procedure: COLONOSCOPY WITH PROPOFOL(at cecum 0957) total withdrawal time=60min);  Surgeon: Danie Binder, MD;  Location: [facility];  Service: Endoscopy;  Laterality: N/A;  . ESOPHAGOGASTRODUODENOSCOPY (EGD) WITH PROPOFOL N/A 05/25/2013   Procedure: ESOPHAGOGASTRODUODENOSCOPY (EGD) WITH PROPOFOL;  Surgeon: Danie Binder, MD;  Location: [facility];  Service: Endoscopy;  Laterality: N/A;  . FOOT SURGERY    . IR FLUORO GUIDE PORT INSERTION RIGHT  03/04/2018  . IR US GUIDE VASC ACCESS RIGHT  03/04/2018  . POLYPECTOMY N/A 05/25/2013   Procedure: POLYPECTOMY (Rectal and Gastric);  Surgeon: Danie Binder, MD;  Location: [facility];  Service: Endoscopy;  Laterality: N/A;  . TONSILLECTOMY    . UPPER GASTROINTESTINAL ENDOSCOPY    . VIDEO BRONCHOSCOPY WITH ENDOBRONCHIAL ULTRASOUND  09/12/2016   Procedure: VIDEO BRONCHOSCOPY WITH ENDOBRONCHIAL ULTRASOUND AND BIOPSY;  Surgeon: Juanito Doom, MD;  Location: [facility];  Service: Cardiopulmonary;;    REVIEW OF SYSTEMS:   Review of Systems  Constitutional: Negative for chills, fever.  Positive for fatigue, decreased appetite and a 2 pound weight loss.  HENT:   Negative for mouth sores, nosebleeds, sore throat and trouble swallowing.   Eyes: Negative for eye problems and icterus.  Respiratory: Negative for cough, hemoptysis, shortness of breath and wheezing.   Cardiovascular: Negative for chest pain and leg swelling.  Gastrointestinal: Negative for abdominal pain, constipation, diarrhea, and vomiting. Positive for intermittent nausea. Genitourinary: Negative for bladder incontinence, difficulty urinating, dysuria, frequency and hematuria.   Musculoskeletal: Negative for back pain, gait problem, neck pain and neck stiffness.  Skin: Negative for itching and rash.  Neurological: Negative for dizziness, extremity weakness, gait problem, headaches,  light-headedness and seizures.  Hematological: Negative for adenopathy. Does not bruise/bleed easily.  Psychiatric/Behavioral: Negative for confusion, depression and sleep disturbance. The patient is not nervous/anxious.     PHYSICAL EXAMINATION:  Blood pressure 128/80, pulse 81, temperature 97.8 F (36.6 C), temperature source Oral, resp. rate 17, height 5\' 4"  (1.626 m), weight 165 lb 6.4 oz (75 kg), SpO2 97 %.  ECOG PERFORMANCE STATUS: 1 - Symptomatic but completely ambulatory  Physical Exam  Constitutional: Oriented to person, place, and time and well-developed, well-nourished, and in no distress. No distress.  HENT:  Head: Normocephalic and atraumatic.  Mouth/Throat: Oropharynx is clear and moist. No oropharyngeal exudate.  Eyes: Conjunctivae are normal. Right eye exhibits no discharge. Left eye exhibits no discharge. No scleral icterus.  Neck: Normal range of motion. Neck supple.  Cardiovascular: Normal rate, regular rhythm, normal heart sounds and intact distal pulses.   Pulmonary/Chest: Effort normal and breath sounds normal. No respiratory distress. No wheezes. No rales.  Abdominal: Soft. Bowel sounds are normal. Exhibits no distension and no mass. There is no tenderness.  Musculoskeletal: Normal range of motion. Exhibits no edema.  Lymphadenopathy:    No cervical adenopathy.  Neurological: Alert and oriented to person, place, and time. Exhibits normal muscle tone. Gait normal. Coordination normal.  Skin: Skin is warm and dry.  Fine maculopapular rash to her upper chest and upper back.  Psychiatric: Mood, memory and judgment normal.  Vitals reviewed.  LABORATORY DATA: Lab Results  Component Value Date   WBC 4.1 05/13/2018   HGB 9.3 (L) 05/13/2018   HCT 27.5 (L) 05/13/2018   MCV 97.4 05/13/2018   PLT 120 (L) 05/13/2018      Chemistry      Component Value Date/Time   NA 142 05/13/2018 0837   NA 141 09/08/2017 1009   K 3.6 05/13/2018 0837   K 3.5 09/08/2017 1009    CL 104 05/13/2018 0837   CO2 26 05/13/2018 0837   CO2 29 09/08/2017 1009   BUN 6 (L) 05/13/2018 0837   BUN 5.8 (L) 09/08/2017 1009   CREATININE 0.69 05/13/2018 0837   CREATININE 0.7 09/08/2017 1009      Component Value Date/Time   CALCIUM 9.0 05/13/2018 0837   CALCIUM 9.0 09/08/2017 1009   ALKPHOS 57 05/13/2018 0837   ALKPHOS 49 09/08/2017 1009   AST 13 05/13/2018 0837   AST 9 09/08/2017 1009   ALT <6 05/13/2018 0837   ALT <6 09/08/2017  1009   BILITOT 0.6 05/13/2018 0837   BILITOT 0.51 09/08/2017 1009       RADIOGRAPHIC STUDIES:  Ct Chest W Contrast  Result Date: 04/20/2018 CLINICAL DATA:  Left upper lobe lung cancer diagnosed in 2017. Chemotherapy and radiation therapy. Ongoing chemotherapy. Bilateral leg pain. Hysterectomy. Cholecystectomy. EXAM: CT CHEST, ABDOMEN, AND PELVIS WITH CONTRAST TECHNIQUE: Multidetector CT imaging of the chest, abdomen and pelvis was performed following the standard protocol during bolus administration of intravenous contrast. CONTRAST:  131mL ISOVUE-300 IOPAMIDOL (ISOVUE-300) INJECTION 61% 100 cc of Isovue-300 COMPARISON:  02/09/2018 PET. 01/30/2018 chest CT. 02/06/2018 abdominal CT. FINDINGS: CT CHEST FINDINGS Cardiovascular: Right-sided Port-A-Cath terminates at the high right atrium. Aortic atherosclerosis. Normal heart size. Minimal anterior pericardial fluid or thickening is slightly increased. No central pulmonary embolism, on this non-dedicated study. Mediastinum/Nodes: No supraclavicular adenopathy. Decrease in soft tissue mass centered in the AP window region. Example at 3.0 X 3.1 cm on image 18/2. Compare 4.5 x 3.1 cm on the prior exam. No hilar adenopathy. Moderate hiatal hernia. Lungs/Pleura: Trace left-sided pleural fluid. Mild centrilobular emphysema. Right lower lobe airspace disease is resolved. Lateral left lower lobe clustered nodules are similar, including on image 77/4. Central left upper lobe and medial left infrahilar/left lower lobe  radiation fibrosis is not significantly changed. Musculoskeletal: Lower cervical spine fixation. CT ABDOMEN PELVIS FINDINGS Hepatobiliary: Mild motion degradation in the upper abdomen. Suspect mild hepatic steatosis. Segment 3 liver lesion is somewhat ill-defined but appears decreased. 1.9 x 1.9 cm on image 54/2 versus 4.3 x 3.0 cm on the prior diagnostic CT. No new liver lesions. Cholecystectomy. Borderline intrahepatic duct dilatation is similar. Common duct normal in caliber at 8 mm. Pancreas: The pancreatic duct is also borderline prominent, without cause identified. No acute pancreatitis. Spleen: Normal in size, without focal abnormality. Adrenals/Urinary Tract: Normal adrenal glands. Upper pole right renal cyst. Normal left kidney. Normal urinary bladder. Stomach/Bowel: Normal remainder of the stomach. Periampullary duodenal diverticulum. Extensive colonic diverticulosis. Normal terminal ileum. Otherwise normal small bowel. Vascular/Lymphatic: Aortic and branch vessel atherosclerosis. No abdominopelvic adenopathy. Reproductive: Hysterectomy.  No adnexal mass. Other: No significant free fluid. No evidence of omental or peritoneal disease. Musculoskeletal: New L3 sclerotic lesion of 1.8 cm. This is at the site of a FDG avid lesion on the prior exam. Degenerate disc disease at multiple lumbosacral levels. IMPRESSION: 1. Response to therapy, as evidenced by decreased size of AP window recurrence and hepatic metastasis. 2. No new or progressive disease. 3. New sclerotic focus of the L3 vertebral body, the site of hypermetabolism on prior PET. This is most consistent with healing and response to therapy. 4. Resolved right lower lobe pneumonia. 5. Chronic hiatal hernia and tiny pericardial effusion. 6. Aortic atherosclerosis (ICD10-I70.0) and emphysema (ICD10-J43.9). Electronically Signed   By: Abigail Miyamoto M.D.   On: 04/20/2018 13:52   Ct Abdomen Pelvis W Contrast  Result Date: 04/20/2018 CLINICAL DATA:  Left  upper lobe lung cancer diagnosed in 2017. Chemotherapy and radiation therapy. Ongoing chemotherapy. Bilateral leg pain. Hysterectomy. Cholecystectomy. EXAM: CT CHEST, ABDOMEN, AND PELVIS WITH CONTRAST TECHNIQUE: Multidetector CT imaging of the chest, abdomen and pelvis was performed following the standard protocol during bolus administration of intravenous contrast. CONTRAST:  165mL ISOVUE-300 IOPAMIDOL (ISOVUE-300) INJECTION 61% 100 cc of Isovue-300 COMPARISON:  02/09/2018 PET. 01/30/2018 chest CT. 02/06/2018 abdominal CT. FINDINGS: CT CHEST FINDINGS Cardiovascular: Right-sided Port-A-Cath terminates at the high right atrium. Aortic atherosclerosis. Normal heart size. Minimal anterior pericardial fluid or thickening is slightly increased. No central  pulmonary embolism, on this non-dedicated study. Mediastinum/Nodes: No supraclavicular adenopathy. Decrease in soft tissue mass centered in the AP window region. Example at 3.0 X 3.1 cm on image 18/2. Compare 4.5 x 3.1 cm on the prior exam. No hilar adenopathy. Moderate hiatal hernia. Lungs/Pleura: Trace left-sided pleural fluid. Mild centrilobular emphysema. Right lower lobe airspace disease is resolved. Lateral left lower lobe clustered nodules are similar, including on image 77/4. Central left upper lobe and medial left infrahilar/left lower lobe radiation fibrosis is not significantly changed. Musculoskeletal: Lower cervical spine fixation. CT ABDOMEN PELVIS FINDINGS Hepatobiliary: Mild motion degradation in the upper abdomen. Suspect mild hepatic steatosis. Segment 3 liver lesion is somewhat ill-defined but appears decreased. 1.9 x 1.9 cm on image 54/2 versus 4.3 x 3.0 cm on the prior diagnostic CT. No new liver lesions. Cholecystectomy. Borderline intrahepatic duct dilatation is similar. Common duct normal in caliber at 8 mm. Pancreas: The pancreatic duct is also borderline prominent, without cause identified. No acute pancreatitis. Spleen: Normal in size, without  focal abnormality. Adrenals/Urinary Tract: Normal adrenal glands. Upper pole right renal cyst. Normal left kidney. Normal urinary bladder. Stomach/Bowel: Normal remainder of the stomach. Periampullary duodenal diverticulum. Extensive colonic diverticulosis. Normal terminal ileum. Otherwise normal small bowel. Vascular/Lymphatic: Aortic and branch vessel atherosclerosis. No abdominopelvic adenopathy. Reproductive: Hysterectomy.  No adnexal mass. Other: No significant free fluid. No evidence of omental or peritoneal disease. Musculoskeletal: New L3 sclerotic lesion of 1.8 cm. This is at the site of a FDG avid lesion on the prior exam. Degenerate disc disease at multiple lumbosacral levels. IMPRESSION: 1. Response to therapy, as evidenced by decreased size of AP window recurrence and hepatic metastasis. 2. No new or progressive disease. 3. New sclerotic focus of the L3 vertebral body, the site of hypermetabolism on prior PET. This is most consistent with healing and response to therapy. 4. Resolved right lower lobe pneumonia. 5. Chronic hiatal hernia and tiny pericardial effusion. 6. Aortic atherosclerosis (ICD10-I70.0) and emphysema (ICD10-J43.9). Electronically Signed   By: Abigail Miyamoto M.D.   On: 04/20/2018 13:52     ASSESSMENT/PLAN:  Small cell lung cancer, left ([place]) This is a very pleasant 62year old white female was limited stage small cell lung cancer, status post 6 cycles of systemic chemotherapy with carboplatin and etoposide concurrent with radiation and followed by prophylactic cranial irradiation. Patient has been in observation for more than a year. She has been doing fine but recently started complaining of increasing fatigue and weakness as well as headache. Repeat CT scan of the chest showed concerning findings for disease recurrence in the lung as well as new lesion in the liver. PET scan which was performed recently and unfortunately showed hypermetabolic activity in the mediastinal lymph  nodes in addition to the metastatic disease to the liver and bone including L3 bone lesion. Ultrasound-guided core biopsy ofoneof the suspicious liver lesion was consistent with recurrent small cell carcinoma. The patient is now onpalliative systemic chemotherapy with carboplatin for AUC of 5 on day 1, etoposide 100 mg/M2 on days 1, 2 and 3 in addition to Tecentriq (Atezolizumab) 1200 mg IV every 3 weeks with Neulasta support.She is status post4cycles.She is tolerating her treatment fairly well with the exception of fatigue, intermittent nausea which is controlled with antiemetics, and rash.  The patient was seen with Dr. Julien Nordmann.   Recommend for her to proceed with cycle 5 of her treatment today as scheduled.  We will reduce the dose of carboplatin to an AUC of 4 and reduce the  etoposide to 90 mg/m.  The Tecentriq will stay at the current dose of 1200 mg. The patient was reminded to continue to get weekly labs closer to home [facility]. She will follow-up in 3 weeks for evaluation prior to cycle #6.  For anxiety,she may continueXanax.  For nausea,she may continue Zofran and Phenergan.Refill of Zofran was provided today.  The patient voices understanding of current disease status and treatment options and is in agreement with the current care plan. All questions were answered. The patient knows to call the clinic with any problems, questions or concerns. We can certainly see the patient much sooner if necessary.   No orders of the defined types were placed in this encounter.  Mikey Bussing, DNP, AGPCNP-BC, AOCNP 05/13/18  ADDENDUM: Hematology/Oncology Attending: I had a face-to-face encounter with the patient today.  I recommended her care plan.  This is a (old white female with recurrent small cell lung cancer.  She is currently undergoing systemic chemotherapy with carboplatin, etoposide and Tecentriq.  She is status post 4 cycles and has been tolerating the  treatment well except for increasing fatigue more with the last cycle.  She also had significant drop in her platelets count after cycle #4. Her CBC is much better today.  I recommended for the patient to proceed with cycle #5 today but I will reduce the dose of carboplatin to AUC of 4 and etoposide to 90 mg/M2. We will see the patient back for follow-up visit in 3 weeks for evaluation before starting cycle #6. For anxiety she will continue on Xanax and for nausea she will continue her current treatment with Zofran and Phenergan. The patient was advised to call immediately if she has any concerning symptoms in the interval.  Disclaimer: This note was dictated with voice recognition software. Similar sounding words can inadvertently be transcribed and may be missed upon review. Eilleen Kempf, MD 05/13/18

## 2018-05-13 NOTE — Assessment & Plan Note (Addendum)
This is a very pleasant 62year old white female was limited stage small cell lung cancer, status post 6 cycles of systemic chemotherapy with carboplatin and etoposide concurrent with radiation and followed by prophylactic cranial irradiation. Patient has been in observation for more than a year. She has been doing fine but recently started complaining of increasing fatigue and weakness as well as headache. Repeat CT scan of the chest showed concerning findings for disease recurrence in the lung as well as new lesion in the liver. PET scan which was performed recently and unfortunately showed hypermetabolic activity in the mediastinal lymph nodes in addition to the metastatic disease to the liver and bone including L3 bone lesion. Ultrasound-guided core biopsy ofoneof the suspicious liver lesion was consistent with recurrent small cell carcinoma. The patient is now onpalliative systemic chemotherapy with carboplatin for AUC of 5 on day 1, etoposide 100 mg/M2 on days 1, 2 and 3 in addition to Tecentriq (Atezolizumab) 1200 mg IV every 3 weeks with Neulasta support.She is status post4cycles.She is tolerating her treatment fairly well with the exception of fatigue, intermittent nausea which is controlled with antiemetics, and rash.  The patient was seen with Dr. Julien Nordmann.   Recommend for her to proceed with cycle 5 of her treatment today as scheduled.  We will reduce the dose of carboplatin to an AUC of 4 and reduce the etoposide to 90 mg/m.  The Tecentriq will stay at the current dose of 1200 mg. The patient was reminded to continue to get weekly labs closer to home [facility]. She will follow-up in 3 weeks for evaluation prior to cycle #6.  For anxiety,she may continueXanax.  For nausea,she may continue Zofran and Phenergan.Refill of Zofran was provided today.  The patient voices understanding of current disease status and treatment options and is in agreement with the  current care plan. All questions were answered. The patient knows to call the clinic with any problems, questions or concerns. We can certainly see the patient much sooner if necessary.

## 2018-05-13 NOTE — Telephone Encounter (Signed)
Scheduled appt per 6/12 los - pt to get an updated schedule in treatment area.

## 2018-05-13 NOTE — Patient Instructions (Signed)
[facility] Discharge Instructions for Patients Receiving Chemotherapy  Today you received the following chemotherapy agents:  Tecentriq, Carboplatin, Etoposide  To help prevent nausea and vomiting after your treatment, we encourage you to take your nausea medication as prescribed.   If you develop nausea and vomiting that is not controlled by your nausea medication, call the clinic.   BELOW ARE SYMPTOMS THAT SHOULD BE REPORTED IMMEDIATELY:  *FEVER GREATER THAN 100.5 F  *CHILLS WITH OR WITHOUT FEVER  NAUSEA AND VOMITING THAT IS NOT CONTROLLED WITH YOUR NAUSEA MEDICATION  *UNUSUAL SHORTNESS OF BREATH  *UNUSUAL BRUISING OR BLEEDING  TENDERNESS IN MOUTH AND THROAT WITH OR WITHOUT PRESENCE OF ULCERS  *URINARY PROBLEMS  *BOWEL PROBLEMS  UNUSUAL RASH Items with * indicate a potential emergency and should be followed up as soon as possible.  Feel free to call the clinic should you have any questions or concerns. The clinic phone number is [phone].  Please show the [facility] at check-in to the Emergency Department and triage nurse.

## 2018-05-14 ENCOUNTER — Inpatient Hospital Stay: Payer: Medicare Other

## 2018-05-14 VITALS — BP 120/77 | HR 72 | Temp 97.7°F | Resp 18

## 2018-05-14 DIAGNOSIS — C3412 Malignant neoplasm of upper lobe, left bronchus or lung: Secondary | ICD-10-CM | POA: Diagnosis not present

## 2018-05-14 DIAGNOSIS — C3492 Malignant neoplasm of unspecified part of left bronchus or lung: Secondary | ICD-10-CM

## 2018-05-14 MED ORDER — HEPARIN SOD (PORK) LOCK FLUSH 100 UNIT/ML IV SOLN
500.0000 [IU] | Freq: Once | INTRAVENOUS | Status: AC | PRN
  Administered 2018-05-14: 500 [IU]
  Filled 2018-05-14: qty 5

## 2018-05-14 MED ORDER — SODIUM CHLORIDE 0.9% FLUSH
10.0000 mL | INTRAVENOUS | Status: DC | PRN
  Administered 2018-05-14: 10 mL
  Filled 2018-05-14: qty 10

## 2018-05-14 MED ORDER — DEXAMETHASONE SODIUM PHOSPHATE 10 MG/ML IJ SOLN
10.0000 mg | Freq: Once | INTRAMUSCULAR | Status: AC
  Administered 2018-05-14: 10 mg via INTRAVENOUS

## 2018-05-14 MED ORDER — ETOPOSIDE CHEMO INJECTION 1 GM/50ML
90.0000 mg/m2 | Freq: Once | INTRAVENOUS | Status: AC
  Administered 2018-05-14: 170 mg via INTRAVENOUS
  Filled 2018-05-14: qty 8.5

## 2018-05-14 MED ORDER — DEXAMETHASONE SODIUM PHOSPHATE 10 MG/ML IJ SOLN
INTRAMUSCULAR | Status: AC
  Filled 2018-05-14: qty 1

## 2018-05-14 MED ORDER — SODIUM CHLORIDE 0.9 % IV SOLN
Freq: Once | INTRAVENOUS | Status: AC
  Administered 2018-05-14: 14:00:00 via INTRAVENOUS

## 2018-05-14 NOTE — Patient Instructions (Signed)
[facility] Discharge Instructions for Patients Receiving Chemotherapy  Today you received the following chemotherapy agents:  etoposide (Vepesid)  To help prevent nausea and vomiting after your treatment, we encourage you to take your nausea medication as prescribed.   If you develop nausea and vomiting that is not controlled by your nausea medication, call the clinic.   BELOW ARE SYMPTOMS THAT SHOULD BE REPORTED IMMEDIATELY:  *FEVER GREATER THAN 100.5 F  *CHILLS WITH OR WITHOUT FEVER  NAUSEA AND VOMITING THAT IS NOT CONTROLLED WITH YOUR NAUSEA MEDICATION  *UNUSUAL SHORTNESS OF BREATH  *UNUSUAL BRUISING OR BLEEDING  TENDERNESS IN MOUTH AND THROAT WITH OR WITHOUT PRESENCE OF ULCERS  *URINARY PROBLEMS  *BOWEL PROBLEMS  UNUSUAL RASH Items with * indicate a potential emergency and should be followed up as soon as possible.  Feel free to call the clinic should you have any questions or concerns. The clinic phone number is [phone].  Please show the [facility] at check-in to the Emergency Department and triage nurse.

## 2018-05-15 ENCOUNTER — Inpatient Hospital Stay: Payer: Medicare Other

## 2018-05-15 VITALS — BP 132/66 | HR 64 | Temp 97.5°F | Resp 18

## 2018-05-15 DIAGNOSIS — C3492 Malignant neoplasm of unspecified part of left bronchus or lung: Secondary | ICD-10-CM

## 2018-05-15 DIAGNOSIS — C3412 Malignant neoplasm of upper lobe, left bronchus or lung: Secondary | ICD-10-CM | POA: Diagnosis not present

## 2018-05-15 MED ORDER — DEXAMETHASONE SODIUM PHOSPHATE 10 MG/ML IJ SOLN
INTRAMUSCULAR | Status: AC
  Filled 2018-05-15: qty 1

## 2018-05-15 MED ORDER — PEGFILGRASTIM 6 MG/0.6ML ~~LOC~~ PSKT
PREFILLED_SYRINGE | SUBCUTANEOUS | Status: AC
  Filled 2018-05-15: qty 0.6

## 2018-05-15 MED ORDER — SODIUM CHLORIDE 0.9% FLUSH
10.0000 mL | INTRAVENOUS | Status: DC | PRN
  Administered 2018-05-15: 10 mL
  Filled 2018-05-15: qty 10

## 2018-05-15 MED ORDER — HEPARIN SOD (PORK) LOCK FLUSH 100 UNIT/ML IV SOLN
500.0000 [IU] | Freq: Once | INTRAVENOUS | Status: AC | PRN
  Administered 2018-05-15: 500 [IU]
  Filled 2018-05-15: qty 5

## 2018-05-15 MED ORDER — SODIUM CHLORIDE 0.9 % IV SOLN
Freq: Once | INTRAVENOUS | Status: AC
  Administered 2018-05-15: 14:00:00 via INTRAVENOUS

## 2018-05-15 MED ORDER — SODIUM CHLORIDE 0.9 % IV SOLN
90.0000 mg/m2 | Freq: Once | INTRAVENOUS | Status: AC
  Administered 2018-05-15: 170 mg via INTRAVENOUS
  Filled 2018-05-15: qty 8.5

## 2018-05-15 MED ORDER — DEXAMETHASONE SODIUM PHOSPHATE 10 MG/ML IJ SOLN
10.0000 mg | Freq: Once | INTRAMUSCULAR | Status: AC
  Administered 2018-05-15: 10 mg via INTRAVENOUS

## 2018-05-15 MED ORDER — PEGFILGRASTIM 6 MG/0.6ML ~~LOC~~ PSKT
6.0000 mg | PREFILLED_SYRINGE | Freq: Once | SUBCUTANEOUS | Status: AC
  Administered 2018-05-15: 6 mg via SUBCUTANEOUS

## 2018-05-15 NOTE — Patient Instructions (Signed)
[facility] Discharge Instructions for Patients Receiving Chemotherapy  Today you received the following chemotherapy agents:  etoposide (Vepesid)  To help prevent nausea and vomiting after your treatment, we encourage you to take your nausea medication as prescribed.   If you develop nausea and vomiting that is not controlled by your nausea medication, call the clinic.   BELOW ARE SYMPTOMS THAT SHOULD BE REPORTED IMMEDIATELY:  *FEVER GREATER THAN 100.5 F  *CHILLS WITH OR WITHOUT FEVER  NAUSEA AND VOMITING THAT IS NOT CONTROLLED WITH YOUR NAUSEA MEDICATION  *UNUSUAL SHORTNESS OF BREATH  *UNUSUAL BRUISING OR BLEEDING  TENDERNESS IN MOUTH AND THROAT WITH OR WITHOUT PRESENCE OF ULCERS  *URINARY PROBLEMS  *BOWEL PROBLEMS  UNUSUAL RASH Items with * indicate a potential emergency and should be followed up as soon as possible.  Feel free to call the clinic should you have any questions or concerns. The clinic phone number is [phone].  Please show the [facility] at check-in to the Emergency Department and triage nurse.

## 2018-05-18 ENCOUNTER — Encounter (HOSPITAL_COMMUNITY)
Admission: RE | Admit: 2018-05-18 | Discharge: 2018-05-18 | Disposition: A | Discharge status: Home / Self Care | Payer: Medicare Other | Source: Ambulatory Visit | Attending: Internal Medicine | Admitting: Internal Medicine

## 2018-05-18 DIAGNOSIS — C3492 Malignant neoplasm of unspecified part of left bronchus or lung: Secondary | ICD-10-CM | POA: Diagnosis not present

## 2018-05-18 LAB — CBC WITH DIFFERENTIAL/PLATELET
BASOS ABS: 0 10*3/uL (ref 0.0–0.1)
BASOS PCT: 0 %
EOS PCT: 0 %
Eosinophils Absolute: 0.1 10*3/uL (ref 0.0–0.7)
HEMATOCRIT: 28.5 % — AB (ref 36.0–46.0)
HEMOGLOBIN: 9.3 g/dL — AB (ref 12.0–15.0)
LYMPHS PCT: 9 %
Lymphs Abs: 2.5 10*3/uL (ref 0.7–4.0)
MCH: 32.7 pg (ref 26.0–34.0)
MCHC: 32.6 g/dL (ref 30.0–36.0)
MCV: 100.4 fL — ABNORMAL HIGH (ref 78.0–100.0)
Monocytes Absolute: 0.3 10*3/uL (ref 0.1–1.0)
Monocytes Relative: 1 %
NEUTROS ABS: 23.3 10*3/uL (ref 1.7–7.7)
Neutrophils Relative %: 90 %
Platelets: 153 10*3/uL (ref 150–400)
RBC: 2.84 MIL/uL — AB (ref 3.87–5.11)
RDW: 19.7 % — AB (ref 11.5–15.5)
WBC: 26.1 10*3/uL — AB (ref 4.0–10.5)

## 2018-05-18 LAB — COMPREHENSIVE METABOLIC PANEL
ALBUMIN: 3.8 g/dL (ref 3.5–5.0)
ALT: 14 U/L (ref 14–54)
AST: 15 U/L (ref 15–41)
Alkaline Phosphatase: 62 U/L (ref 38–126)
Anion gap: 11 (ref 5–15)
BILIRUBIN TOTAL: 1.1 mg/dL (ref 0.3–1.2)
BUN: 13 mg/dL (ref 6–20)
CO2: 28 mmol/L (ref 22–32)
Calcium: 8.5 mg/dL — ABNORMAL LOW (ref 8.9–10.3)
Chloride: 103 mmol/L (ref 101–111)
Creatinine, Ser: 0.61 mg/dL (ref 0.44–1.00)
GFR calc Af Amer: >60 mL/min (ref >60)
GFR calc non Af Amer: >60 mL/min (ref >60)
GLUCOSE: 80 mg/dL (ref 65–99)
Potassium: 3.4 mmol/L — ABNORMAL LOW (ref 3.5–5.1)
SODIUM: 142 mmol/L (ref 135–145)
TOTAL PROTEIN: 6.4 g/dL — AB (ref 6.5–8.1)

## 2018-05-26 ENCOUNTER — Encounter (HOSPITAL_COMMUNITY)
Admission: RE | Admit: 2018-05-26 | Discharge: 2018-05-26 | Disposition: A | Discharge status: Home / Self Care | Payer: Medicare Other | Source: Ambulatory Visit | Attending: Internal Medicine | Admitting: Internal Medicine

## 2018-05-26 DIAGNOSIS — C3492 Malignant neoplasm of unspecified part of left bronchus or lung: Secondary | ICD-10-CM | POA: Diagnosis not present

## 2018-05-26 LAB — CBC WITH DIFFERENTIAL/PLATELET
BASOS PCT: 0 %
Basophils Absolute: 0 10*3/uL (ref 0.0–0.1)
EOS ABS: 0 10*3/uL (ref 0.0–0.7)
Eosinophils Relative: 1 %
HCT: 23.9 % — ABNORMAL LOW (ref 36.0–46.0)
HEMOGLOBIN: 8 g/dL — AB (ref 12.0–15.0)
LYMPHS ABS: 1.8 10*3/uL (ref 0.7–4.0)
Lymphocytes Relative: 41 %
MCH: 32.9 pg (ref 26.0–34.0)
MCHC: 33.5 g/dL (ref 30.0–36.0)
MCV: 98.4 fL (ref 78.0–100.0)
MONO ABS: 0.5 10*3/uL (ref 0.1–1.0)
MONOS PCT: 10 %
NEUTROS PCT: 48 %
Neutro Abs: 2.1 10*3/uL (ref 1.7–7.7)
Platelets: 31 10*3/uL — ABNORMAL LOW (ref 150–400)
RBC: 2.43 MIL/uL — ABNORMAL LOW (ref 3.87–5.11)
RDW: 18.8 % — AB (ref 11.5–15.5)
WBC: 4.4 10*3/uL (ref 4.0–10.5)

## 2018-05-26 LAB — COMPREHENSIVE METABOLIC PANEL
ALK PHOS: 58 U/L (ref 38–126)
ALT: 10 U/L (ref 0–44)
ANION GAP: 10 (ref 5–15)
AST: 15 U/L (ref 15–41)
Albumin: 3.8 g/dL (ref 3.5–5.0)
BUN: 5 mg/dL — ABNORMAL LOW (ref 8–23)
CO2: 27 mmol/L (ref 22–32)
Calcium: 8.9 mg/dL (ref 8.9–10.3)
Chloride: 106 mmol/L (ref 98–111)
Creatinine, Ser: 0.64 mg/dL (ref 0.44–1.00)
GFR calc non Af Amer: >60 mL/min (ref >60)
Glucose, Bld: 100 mg/dL — ABNORMAL HIGH (ref 70–99)
POTASSIUM: 3.6 mmol/L (ref 3.5–5.1)
SODIUM: 143 mmol/L (ref 135–145)
TOTAL PROTEIN: 6.6 g/dL (ref 6.5–8.1)
Total Bilirubin: 1 mg/dL (ref 0.3–1.2)

## 2018-05-29 ENCOUNTER — Encounter: Payer: Self-pay | Admitting: Pharmacist

## 2018-05-31 DIAGNOSIS — R0902 Hypoxemia: Secondary | ICD-10-CM | POA: Diagnosis not present

## 2018-05-31 DIAGNOSIS — J449 Chronic obstructive pulmonary disease, unspecified: Secondary | ICD-10-CM | POA: Diagnosis not present

## 2018-06-02 ENCOUNTER — Inpatient Hospital Stay: Payer: Medicare Other

## 2018-06-02 ENCOUNTER — Encounter: Payer: Self-pay | Admitting: Internal Medicine

## 2018-06-02 ENCOUNTER — Encounter: Payer: Self-pay | Admitting: *Deleted

## 2018-06-02 ENCOUNTER — Other Ambulatory Visit: Payer: Self-pay | Admitting: *Deleted

## 2018-06-02 ENCOUNTER — Inpatient Hospital Stay: Payer: Medicare Other | Attending: Internal Medicine

## 2018-06-02 ENCOUNTER — Inpatient Hospital Stay: Payer: Medicare Other | Admitting: Internal Medicine

## 2018-06-02 ENCOUNTER — Telehealth: Payer: Self-pay | Admitting: Internal Medicine

## 2018-06-02 VITALS — BP 129/76 | HR 76 | Temp 97.9°F | Resp 20 | Wt 165.6 lb

## 2018-06-02 DIAGNOSIS — C787 Secondary malignant neoplasm of liver and intrahepatic bile duct: Secondary | ICD-10-CM | POA: Insufficient documentation

## 2018-06-02 DIAGNOSIS — Z79899 Other long term (current) drug therapy: Secondary | ICD-10-CM | POA: Insufficient documentation

## 2018-06-02 DIAGNOSIS — I10 Essential (primary) hypertension: Secondary | ICD-10-CM | POA: Insufficient documentation

## 2018-06-02 DIAGNOSIS — E860 Dehydration: Secondary | ICD-10-CM

## 2018-06-02 DIAGNOSIS — J449 Chronic obstructive pulmonary disease, unspecified: Secondary | ICD-10-CM | POA: Insufficient documentation

## 2018-06-02 DIAGNOSIS — C7951 Secondary malignant neoplasm of bone: Secondary | ICD-10-CM | POA: Insufficient documentation

## 2018-06-02 DIAGNOSIS — Z923 Personal history of irradiation: Secondary | ICD-10-CM | POA: Diagnosis not present

## 2018-06-02 DIAGNOSIS — C3412 Malignant neoplasm of upper lobe, left bronchus or lung: Secondary | ICD-10-CM

## 2018-06-02 DIAGNOSIS — D61810 Antineoplastic chemotherapy induced pancytopenia: Secondary | ICD-10-CM | POA: Diagnosis not present

## 2018-06-02 DIAGNOSIS — C3492 Malignant neoplasm of unspecified part of left bronchus or lung: Secondary | ICD-10-CM

## 2018-06-02 DIAGNOSIS — Z5111 Encounter for antineoplastic chemotherapy: Secondary | ICD-10-CM | POA: Diagnosis not present

## 2018-06-02 DIAGNOSIS — D6481 Anemia due to antineoplastic chemotherapy: Secondary | ICD-10-CM

## 2018-06-02 DIAGNOSIS — R5382 Chronic fatigue, unspecified: Secondary | ICD-10-CM

## 2018-06-02 DIAGNOSIS — C3432 Malignant neoplasm of lower lobe, left bronchus or lung: Secondary | ICD-10-CM | POA: Diagnosis not present

## 2018-06-02 DIAGNOSIS — R112 Nausea with vomiting, unspecified: Secondary | ICD-10-CM

## 2018-06-02 DIAGNOSIS — T451X5A Adverse effect of antineoplastic and immunosuppressive drugs, initial encounter: Secondary | ICD-10-CM

## 2018-06-02 DIAGNOSIS — Z5112 Encounter for antineoplastic immunotherapy: Secondary | ICD-10-CM | POA: Insufficient documentation

## 2018-06-02 DIAGNOSIS — E039 Hypothyroidism, unspecified: Secondary | ICD-10-CM

## 2018-06-02 DIAGNOSIS — D649 Anemia, unspecified: Secondary | ICD-10-CM

## 2018-06-02 DIAGNOSIS — F419 Anxiety disorder, unspecified: Secondary | ICD-10-CM | POA: Diagnosis not present

## 2018-06-02 LAB — CBC WITH DIFFERENTIAL (CANCER CENTER ONLY)
BASOS ABS: 0 10*3/uL (ref 0.0–0.1)
BASOS PCT: 0 %
EOS ABS: 0 10*3/uL (ref 0.0–0.5)
EOS PCT: 1 %
HCT: 21.5 % — ABNORMAL LOW (ref 34.8–46.6)
Hemoglobin: 7.1 g/dL — ABNORMAL LOW (ref 11.6–15.9)
Lymphocytes Relative: 22 %
Lymphs Abs: 1 10*3/uL (ref 0.9–3.3)
MCH: 33.4 pg (ref 25.1–34.0)
MCHC: 32.8 g/dL (ref 31.5–36.0)
MCV: 101.7 fL — AB (ref 79.5–101.0)
MONO ABS: 0.6 10*3/uL (ref 0.1–0.9)
Monocytes Relative: 14 %
Neutro Abs: 2.9 10*3/uL (ref 1.5–6.5)
Neutrophils Relative %: 63 %
PLATELETS: 101 10*3/uL — AB (ref 145–400)
RBC: 2.12 MIL/uL — AB (ref 3.70–5.45)
RDW: 24.3 % — AB (ref 11.2–14.5)
WBC: 4.5 10*3/uL (ref 3.9–10.3)

## 2018-06-02 LAB — CMP (CANCER CENTER ONLY)
ALBUMIN: 3.7 g/dL (ref 3.5–5.0)
ALK PHOS: 56 U/L (ref 38–126)
ALT: <6 U/L (ref 0–44)
AST: 10 U/L — AB (ref 15–41)
Anion gap: 8 (ref 5–15)
BILIRUBIN TOTAL: 0.7 mg/dL (ref 0.3–1.2)
BUN: 7 mg/dL — AB (ref 8–23)
CALCIUM: 8.2 mg/dL — AB (ref 8.9–10.3)
CO2: 27 mmol/L (ref 22–32)
CREATININE: 0.69 mg/dL (ref 0.44–1.00)
Chloride: 106 mmol/L (ref 98–111)
GFR, Est AFR Am: >60 mL/min (ref >60)
GFR, Estimated: >60 mL/min (ref >60)
GLUCOSE: 96 mg/dL (ref 70–99)
POTASSIUM: 3.6 mmol/L (ref 3.5–5.1)
Sodium: 141 mmol/L (ref 135–145)
TOTAL PROTEIN: 6.1 g/dL — AB (ref 6.5–8.1)

## 2018-06-02 LAB — PREPARE RBC (CROSSMATCH)

## 2018-06-02 LAB — TSH: TSH: 0.862 u[IU]/mL (ref 0.308–3.960)

## 2018-06-02 MED ORDER — DIPHENHYDRAMINE HCL 25 MG PO CAPS
ORAL_CAPSULE | ORAL | Status: AC
Start: 2018-06-02 — End: ?
  Filled 2018-06-02: qty 1

## 2018-06-02 MED ORDER — HEPARIN SOD (PORK) LOCK FLUSH 100 UNIT/ML IV SOLN
500.0000 [IU] | Freq: Every day | INTRAVENOUS | Status: AC | PRN
  Administered 2018-06-02: 500 [IU]
  Filled 2018-06-02: qty 5

## 2018-06-02 MED ORDER — SODIUM CHLORIDE 0.9% FLUSH
10.0000 mL | INTRAVENOUS | Status: DC | PRN
  Administered 2018-06-02: 10 mL
  Filled 2018-06-02: qty 10

## 2018-06-02 MED ORDER — SODIUM CHLORIDE 0.9% FLUSH
10.0000 mL | INTRAVENOUS | Status: AC | PRN
  Administered 2018-06-02: 10 mL
  Filled 2018-06-02: qty 10

## 2018-06-02 MED ORDER — ACETAMINOPHEN 325 MG PO TABS
ORAL_TABLET | ORAL | Status: AC
Start: 2018-06-02 — End: ?
  Filled 2018-06-02: qty 2

## 2018-06-02 MED ORDER — DIPHENHYDRAMINE HCL 25 MG PO CAPS
25.0000 mg | ORAL_CAPSULE | Freq: Once | ORAL | Status: AC
  Administered 2018-06-02: 25 mg via ORAL

## 2018-06-02 MED ORDER — ACETAMINOPHEN 325 MG PO TABS
650.0000 mg | ORAL_TABLET | Freq: Once | ORAL | Status: AC
  Administered 2018-06-02: 650 mg via ORAL

## 2018-06-02 MED ORDER — SODIUM CHLORIDE 0.9 % IV SOLN
250.0000 mL | Freq: Once | INTRAVENOUS | Status: AC
  Administered 2018-06-02: 250 mL via INTRAVENOUS

## 2018-06-02 NOTE — Addendum Note (Signed)
Addended by: Amelia Jo I on: 06/02/2018 09:58 AM   Modules accepted: Orders

## 2018-06-02 NOTE — Telephone Encounter (Signed)
Scheduled appt per 7/2 los - pt is aware of changes and will pick up an updated schedule in treatment area.

## 2018-06-02 NOTE — Progress Notes (Signed)
[facility] Telephone:([phone]   Fax:(336) [phone]  OFFICE PROGRESS NOTE  Redmond School, MD [address] [place] [address]  DIAGNOSIS: Metastatic small cell lung cancer initially diagnosed as Limited stage (T1b, N2, M0) small cell lung cancer presented with left lower lobe/infrahilar mass and large mediastinal lymphadenopathy diagnosed in October 2017.  PRIOR THERAPY: 1) Systemic chemotherapy with cisplatin 60 MG/M2 on day 1 and etoposide 120 MG/M2 on days 1, 2 and 3 status post 1 cycle. This was discontinued secondary to intolerance. 2) Systemic chemotherapy with carboplatin for AUC of 4 on day 1 and etoposide 100 MG/M2 on days 1, 2 and 3 with Neulasta support on day 4 every 3 weeks. Status post 5 cycles. This was concurrent with radiation in [place], [place]. 3) prophylactic cranial irradiation.  CURRENT THERAPY: Systemic chemotherapy with carboplatin for AUC of 5 on day 1, etoposide 100 mg/M2 on days 1, 2 and 3 as well as a Tecentriq (Atezolizumab) 1200 mg IV every 3 weeks with Neulasta support.  First dose February 17, 2018.  Status post 5 cycles.  INTERVAL HISTORY: Rebekah Henderson 62 y.o. female returns to the clinic today for follow-up visit accompanied by friend.  The patient complaining of increasing fatigue and weakness more in the last 1-2 weeks.  She denied having any current chest pain, shortness of breath except with exertion with no cough or hemoptysis.  She denied having any recent weight loss or night sweats.  She has no nausea, vomiting, diarrhea or constipation.  She has no headache or visual changes.  She was supposed to start cycle #6 of her treatment today.   MEDICAL HISTORY: Past Medical History:  Diagnosis Date  . 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 Anxiety    takes Prozac daily  . Arthritis   . Benign fundic gland polyps of stomach   . Colon polyps   . COPD (chronic obstructive pulmonary disease) ([facility])     . Dehydration 03/06/2017  . Diverticulitis   . Dyspnea    with exertion  . Encounter for antineoplastic chemotherapy 12/03/2016  . Fibromyalgia   . GERD (gastroesophageal reflux disease)    takes Pantoprazole daily  . Hypertension    takes Metoprolol,Triamterene-HCTZ,and Amlodipine daily  . Hypothyroidism    takes Synthroid daily  . IBS (irritable bowel syndrome)   . lung ca dx'd 10/02/2016   skin, lung  . PONV (postoperative nausea and vomiting)    pt also states that she had some difficulty breathing after cervical fusion    ALLERGIES:  is allergic to penicillins; codeine; fentanyl; ranitidine hcl; keflex [cephalexin]; and lyrica [pregabalin].  MEDICATIONS:  Current Outpatient Medications  Medication Sig Dispense Refill  . albuterol (PROVENTIL HFA;VENTOLIN HFA) 108 (90 Base) MCG/ACT inhaler Inhale 2 puffs into the lungs every 4 (four) hours as needed for wheezing or shortness of breath.    . ALPRAZolam (XANAX) 0.25 MG tablet Take 2 tablets (0.5 mg total) by mouth at bedtime as needed for anxiety. 30 tablet 0  . amLODipine (NORVASC) 5 MG tablet Take 5 mg by mouth daily.      Marland Kitchen dicyclomine (BENTYL) 20 MG tablet Take 20 mg by mouth 3 (three) times daily as needed for spasms.    Marland Kitchen estazolam (PROSOM) 2 MG tablet Take 2 mg by mouth at bedtime.    Marland Kitchen estradiol (ESTRACE) 2 MG tablet Take 2 mg by mouth daily.      Marland Kitchen FeFum-FePoly-FA-B Cmp-C-Biot (INTEGRA PLUS) CAPS  TAKE 1 CAPSULE BY MOUTH ONCE DAILY IN THE MORNING. 30 capsule 0  . FLUoxetine (PROZAC) 40 MG capsule Take 40 mg by mouth daily.   2  . HYDROcodone-acetaminophen (NORCO/VICODIN) 5-325 MG tablet Take 1 tablet by mouth every 4 (four) hours as needed for moderate pain.   0  . levothyroxine (SYNTHROID, LEVOTHROID) 50 MCG tablet Take 50 mcg by mouth daily before breakfast.     . lidocaine-prilocaine (EMLA) cream Apply 1 application topically as needed. To numb skin over port a cath: Squeeze a  small amount on cotton ball and place over  port site 1-2 hours prior to chemotherapy. 30 g 0  . loperamide (IMODIUM) 2 MG capsule Take 2 mg by mouth every 2 (two) hours as needed for diarrhea or loose stools.    . metoprolol succinate (TOPROL-XL) 25 MG 24 hr tablet Take 25 mg by mouth daily.    . ondansetron (ZOFRAN-ODT) 4 MG disintegrating tablet DISSOLVE 1 TABLET BY MOUTH EVERY 8 HOURS AS NEEDED FOR NAUSEA AND VOMITING. 20 tablet 1  . pantoprazole (PROTONIX) 40 MG tablet Take 40 mg by mouth 2 (two) times daily before a meal.   10  . potassium chloride SA (K-DUR,KLOR-CON) 20 MEQ tablet Take 1 tablet twice a day for 7 days then 1 tablet daily. 30 tablet 0  . promethazine (PHENERGAN) 25 MG tablet Take 1 tablet (25 mg total) by mouth every 6 (six) hours as needed. 30 tablet 10  . senna-docusate (SENOKOT-S) 8.6-50 MG tablet Take 1 tablet by mouth daily as needed.     No current facility-administered medications for this visit.     SURGICAL HISTORY:  Past Surgical History:  Procedure Laterality Date  . BIOPSY N/A 05/25/2013   Procedure: BIOPSIES (Random Colon; Duodenal; Gastric);  Surgeon: Danie Binder, MD;  Location: [facility];  Service: Endoscopy;  Laterality: N/A;  . BLADDER SUSPENSION    . BREAST ENHANCEMENT SURGERY    . BREAST IMPLANT REMOVAL    . CERVICAL FUSION  AUG 2013  . CHOLECYSTECTOMY  1999  . COLONOSCOPY  2007 [place]   POLYPS  . COLONOSCOPY WITH PROPOFOL N/A 05/25/2013   Procedure: COLONOSCOPY WITH PROPOFOL(at cecum 0957) total withdrawal time=3min);  Surgeon: Danie Binder, MD;  Location: [facility];  Service: Endoscopy;  Laterality: N/A;  . ESOPHAGOGASTRODUODENOSCOPY (EGD) WITH PROPOFOL N/A 05/25/2013   Procedure: ESOPHAGOGASTRODUODENOSCOPY (EGD) WITH PROPOFOL;  Surgeon: Danie Binder, MD;  Location: [facility];  Service: Endoscopy;  Laterality: N/A;  . FOOT SURGERY    . IR FLUORO GUIDE PORT INSERTION RIGHT  03/04/2018  . IR US GUIDE VASC ACCESS RIGHT  03/04/2018  . POLYPECTOMY N/A 05/25/2013   Procedure: POLYPECTOMY (Rectal and  Gastric);  Surgeon: Danie Binder, MD;  Location: [facility];  Service: Endoscopy;  Laterality: N/A;  . TONSILLECTOMY    . UPPER GASTROINTESTINAL ENDOSCOPY    . VIDEO BRONCHOSCOPY WITH ENDOBRONCHIAL ULTRASOUND  09/12/2016   Procedure: VIDEO BRONCHOSCOPY WITH ENDOBRONCHIAL ULTRASOUND AND BIOPSY;  Surgeon: Juanito Doom, MD;  Location: [facility];  Service: Cardiopulmonary;;    REVIEW OF SYSTEMS:  Constitutional: positive for fatigue Eyes: negative Ears, nose, mouth, throat, and face: negative Respiratory: positive for dyspnea on exertion Cardiovascular: negative Gastrointestinal: negative Genitourinary:negative Integument/breast: negative Hematologic/lymphatic: negative Musculoskeletal:positive for arthralgias Neurological: negative Behavioral/Psych: negative Endocrine: negative Allergic/Immunologic: negative   PHYSICAL EXAMINATION: General appearance: alert, cooperative, fatigued and no distress Head: Normocephalic, without obvious abnormality, atraumatic Neck: no adenopathy, no JVD, supple, symmetrical, trachea midline and thyroid  not enlarged, symmetric, no tenderness/mass/nodules Lymph nodes: Cervical, supraclavicular, and axillary nodes normal. Resp: clear to auscultation bilaterally Back: symmetric, no curvature. ROM normal. No CVA tenderness. Cardio: regular rate and rhythm, S1, S2 normal, no murmur, click, rub or gallop GI: soft, non-tender; bowel sounds normal; no masses,  no organomegaly Extremities: extremities normal, atraumatic, no cyanosis or edema Neurologic: Alert and oriented X 3, normal strength and tone. Normal symmetric reflexes. Normal coordination and gait   ECOG PERFORMANCE STATUS: 1 - Symptomatic but completely ambulatory  Blood pressure 129/76, pulse 76, temperature 97.9 F (36.6 C), temperature source Oral, resp. rate 20, weight 165 lb 9.6 oz (75.1 kg), SpO2 96 %.  LABORATORY DATA: Lab Results  Component Value Date   WBC 4.5 06/02/2018   HGB 7.1 (L)  06/02/2018   HCT 21.5 (L) 06/02/2018   MCV 101.7 (H) 06/02/2018   PLT 101 (L) 06/02/2018      Chemistry      Component Value Date/Time   NA 143 05/26/2018 0926   NA 141 09/08/2017 1009   K 3.6 05/26/2018 0926   K 3.5 09/08/2017 1009   CL 106 05/26/2018 0926   CO2 27 05/26/2018 0926   CO2 29 09/08/2017 1009   BUN 5 (L) 05/26/2018 0926   BUN 5.8 (L) 09/08/2017 1009   CREATININE 0.64 05/26/2018 0926   CREATININE 0.69 05/13/2018 0837   CREATININE 0.7 09/08/2017 1009      Component Value Date/Time   CALCIUM 8.9 05/26/2018 0926   CALCIUM 9.0 09/08/2017 1009   ALKPHOS 58 05/26/2018 0926   ALKPHOS 49 09/08/2017 1009   AST 15 05/26/2018 0926   AST 13 05/13/2018 0837   AST 9 09/08/2017 1009   ALT 10 05/26/2018 0926   ALT <6 05/13/2018 0837   ALT <6 09/08/2017 1009   BILITOT 1.0 05/26/2018 0926   BILITOT 0.6 05/13/2018 0837   BILITOT 0.51 09/08/2017 1009       RADIOGRAPHIC STUDIES: No results found.  ASSESSMENT AND PLAN:  This is a very pleasant 62 years old white female was limited stage small cell lung cancer, status post 6 cycles of systemic chemotherapy with carboplatin and etoposide concurrent with radiation and followed by prophylactic cranial irradiation. Patient has been in observation for more than a year.  She has been doing fine but recently started complaining of increasing fatigue and weakness as well as headache. The patient had evidence for disease recurrence and she was a started on treatment with systemic chemotherapy with carboplatin, etoposide and Tecentriq.  She is status post 5 cycles.  She has been tolerating this treatment well except for fatigue and pancytopenia. She was supposed to start cycle #6 today for treatment.  Unfortunately she continues to have significant anemia requiring PRBCs transfusion. I will arrange for the patient to receive 2 units of PRBCs transfusion today. I will delay the start of cycle number 6 by 1 week until improvement of her  condition and fatigue. I will arrange for the patient to come back for follow-up visit in 4 weeks for evaluation after repeating CT scan of the chest, abdomen and pelvis for restaging of her disease. She was advised to call immediately if she has any concerning symptoms in the interval. The patient voices understanding of current disease status and treatment options and is in agreement with the current care plan. All questions were answered. The patient knows to call the clinic with any problems, questions or concerns. We can certainly see the patient much sooner if  necessary. Disclaimer: This note was dictated with voice recognition software. Similar sounding words can inadvertently be transcribed and may not be corrected upon review.

## 2018-06-02 NOTE — Progress Notes (Signed)
Oncology Nurse Navigator Documentation  Oncology Nurse Navigator Flowsheets 06/02/2018  Navigator Location [facility]  Navigator Encounter Type Clinic/MDC/Patient was seen by Dr. Julien Nordmann today.  Her blood count is low and she needs blood transfusion.  She states she gets SOB easy and she doesn't feel well.  I spoke to her about energy conservation tips.  She will be getting blood today.  Treatment Phase Treatment  Barriers/Navigation Needs Education  Education Other  Interventions Education  Education Method Verbal  Acuity Level 1  Time Spent with Patient 30

## 2018-06-02 NOTE — Patient Instructions (Signed)

## 2018-06-03 ENCOUNTER — Inpatient Hospital Stay: Payer: Medicare Other

## 2018-06-03 LAB — TYPE AND SCREEN
ABO/RH(D): O POS
ANTIBODY SCREEN: NEGATIVE
UNIT DIVISION: 0
UNIT DIVISION: 0

## 2018-06-03 LAB — BPAM RBC
Blood Product Expiration Date: 201907262359
Blood Product Expiration Date: 201907302359
ISSUE DATE / TIME: 201907021128
ISSUE DATE / TIME: 201907021128
Unit Type and Rh: 5100
Unit Type and Rh: 5100

## 2018-06-05 ENCOUNTER — Inpatient Hospital Stay: Payer: Medicare Other

## 2018-06-08 ENCOUNTER — Inpatient Hospital Stay: Payer: Medicare Other | Admitting: *Deleted

## 2018-06-08 ENCOUNTER — Inpatient Hospital Stay: Payer: Medicare Other

## 2018-06-08 VITALS — BP 130/70 | HR 79 | Temp 97.7°F | Resp 18

## 2018-06-08 DIAGNOSIS — C3432 Malignant neoplasm of lower lobe, left bronchus or lung: Secondary | ICD-10-CM | POA: Diagnosis not present

## 2018-06-08 DIAGNOSIS — C3492 Malignant neoplasm of unspecified part of left bronchus or lung: Secondary | ICD-10-CM

## 2018-06-08 DIAGNOSIS — C3412 Malignant neoplasm of upper lobe, left bronchus or lung: Secondary | ICD-10-CM

## 2018-06-08 LAB — CMP (CANCER CENTER ONLY)
ALT: 9 U/L (ref 0–44)
AST: 14 U/L — AB (ref 15–41)
Albumin: 3.9 g/dL (ref 3.5–5.0)
Alkaline Phosphatase: 51 U/L (ref 38–126)
Anion gap: 10 (ref 5–15)
BUN: 8 mg/dL (ref 8–23)
CALCIUM: 9.7 mg/dL (ref 8.9–10.3)
CHLORIDE: 105 mmol/L (ref 98–111)
CO2: 27 mmol/L (ref 22–32)
CREATININE: 0.71 mg/dL (ref 0.44–1.00)
GFR, Estimated: >60 mL/min (ref >60)
Glucose, Bld: 93 mg/dL (ref 70–99)
Potassium: 3.9 mmol/L (ref 3.5–5.1)
SODIUM: 142 mmol/L (ref 135–145)
Total Bilirubin: 0.6 mg/dL (ref 0.3–1.2)
Total Protein: 6.9 g/dL (ref 6.5–8.1)

## 2018-06-08 LAB — CBC WITH DIFFERENTIAL (CANCER CENTER ONLY)
Basophils Absolute: 0 10*3/uL (ref 0.0–0.1)
Basophils Relative: 1 %
EOS ABS: 0 10*3/uL (ref 0.0–0.5)
Eosinophils Relative: 1 %
HCT: 36.6 % (ref 34.8–46.6)
HEMOGLOBIN: 12.3 g/dL (ref 11.6–15.9)
LYMPHS ABS: 1 10*3/uL (ref 0.9–3.3)
LYMPHS PCT: 24 %
MCH: 32.6 pg (ref 25.1–34.0)
MCHC: 33.6 g/dL (ref 31.5–36.0)
MCV: 97.1 fL (ref 79.5–101.0)
Monocytes Absolute: 0.7 10*3/uL (ref 0.1–0.9)
Monocytes Relative: 16 %
NEUTROS PCT: 58 %
Neutro Abs: 2.5 10*3/uL (ref 1.5–6.5)
PLATELETS: 170 10*3/uL (ref 145–400)
RBC: 3.77 MIL/uL (ref 3.70–5.45)
RDW: 21.5 % — ABNORMAL HIGH (ref 11.2–14.5)
WBC: 4.2 10*3/uL (ref 3.9–10.3)

## 2018-06-08 MED ORDER — SODIUM CHLORIDE 0.9 % IV SOLN
Freq: Once | INTRAVENOUS | Status: AC
Start: 2018-06-08 — End: 2018-06-08
  Administered 2018-06-08: 09:00:00 via INTRAVENOUS

## 2018-06-08 MED ORDER — DEXAMETHASONE SODIUM PHOSPHATE 10 MG/ML IJ SOLN
INTRAMUSCULAR | Status: AC
  Filled 2018-06-08: qty 1

## 2018-06-08 MED ORDER — DEXAMETHASONE SODIUM PHOSPHATE 10 MG/ML IJ SOLN
10.0000 mg | Freq: Once | INTRAMUSCULAR | Status: AC
  Administered 2018-06-08: 10 mg via INTRAVENOUS

## 2018-06-08 MED ORDER — SODIUM CHLORIDE 0.9 % IV SOLN
438.0000 mg | Freq: Once | INTRAVENOUS | Status: AC
  Administered 2018-06-08: 440 mg via INTRAVENOUS
  Filled 2018-06-08: qty 44

## 2018-06-08 MED ORDER — DIPHENHYDRAMINE HCL 50 MG/ML IJ SOLN
INTRAMUSCULAR | Status: AC
  Filled 2018-06-08: qty 1

## 2018-06-08 MED ORDER — PALONOSETRON HCL INJECTION 0.25 MG/5ML
INTRAVENOUS | Status: AC
  Filled 2018-06-08: qty 5

## 2018-06-08 MED ORDER — SODIUM CHLORIDE 0.9 % IV SOLN
1200.0000 mg | Freq: Once | INTRAVENOUS | Status: AC
  Administered 2018-06-08: 1200 mg via INTRAVENOUS
  Filled 2018-06-08: qty 20

## 2018-06-08 MED ORDER — DIPHENHYDRAMINE HCL 50 MG/ML IJ SOLN
25.0000 mg | Freq: Once | INTRAMUSCULAR | Status: AC
  Administered 2018-06-08: 25 mg via INTRAVENOUS

## 2018-06-08 MED ORDER — FAMOTIDINE IN NACL 20-0.9 MG/50ML-% IV SOLN
INTRAVENOUS | Status: AC
  Filled 2018-06-08: qty 50

## 2018-06-08 MED ORDER — SODIUM CHLORIDE 0.9% FLUSH
10.0000 mL | INTRAVENOUS | Status: DC | PRN
  Administered 2018-06-08: 10 mL
  Filled 2018-06-08: qty 10

## 2018-06-08 MED ORDER — PALONOSETRON HCL INJECTION 0.25 MG/5ML
0.2500 mg | Freq: Once | INTRAVENOUS | Status: AC
  Administered 2018-06-08: 0.25 mg via INTRAVENOUS

## 2018-06-08 MED ORDER — SODIUM CHLORIDE 0.9 % IV SOLN
90.0000 mg/m2 | Freq: Once | INTRAVENOUS | Status: AC
  Administered 2018-06-08: 170 mg via INTRAVENOUS
  Filled 2018-06-08: qty 8.5

## 2018-06-08 MED ORDER — FAMOTIDINE IN NACL 20-0.9 MG/50ML-% IV SOLN
20.0000 mg | Freq: Once | INTRAVENOUS | Status: AC
  Administered 2018-06-08: 20 mg via INTRAVENOUS

## 2018-06-08 MED ORDER — HEPARIN SOD (PORK) LOCK FLUSH 100 UNIT/ML IV SOLN
500.0000 [IU] | Freq: Once | INTRAVENOUS | Status: AC | PRN
  Administered 2018-06-08: 500 [IU]
  Filled 2018-06-08: qty 5

## 2018-06-08 NOTE — Patient Instructions (Signed)
[facility] Discharge Instructions for Patients Receiving Chemotherapy  Today you received the following chemotherapy agents :  Tecentriq,  Carboplatin,  Etoposide.  To help prevent nausea and vomiting after your treatment, we encourage you to take your nausea medication as prescribed.   If you develop nausea and vomiting that is not controlled by your nausea medication, call the clinic.   BELOW ARE SYMPTOMS THAT SHOULD BE REPORTED IMMEDIATELY:  *FEVER GREATER THAN 100.5 F  *CHILLS WITH OR WITHOUT FEVER  NAUSEA AND VOMITING THAT IS NOT CONTROLLED WITH YOUR NAUSEA MEDICATION  *UNUSUAL SHORTNESS OF BREATH  *UNUSUAL BRUISING OR BLEEDING  TENDERNESS IN MOUTH AND THROAT WITH OR WITHOUT PRESENCE OF ULCERS  *URINARY PROBLEMS  *BOWEL PROBLEMS  UNUSUAL RASH Items with * indicate a potential emergency and should be followed up as soon as possible.  Feel free to call the clinic should you have any questions or concerns. The clinic phone number is [phone].  Please show the [facility] at check-in to the Emergency Department and triage nurse.

## 2018-06-09 ENCOUNTER — Inpatient Hospital Stay: Payer: Medicare Other

## 2018-06-09 VITALS — BP 99/72 | HR 70 | Temp 98.0°F | Resp 18

## 2018-06-09 DIAGNOSIS — C3432 Malignant neoplasm of lower lobe, left bronchus or lung: Secondary | ICD-10-CM | POA: Diagnosis not present

## 2018-06-09 DIAGNOSIS — C3492 Malignant neoplasm of unspecified part of left bronchus or lung: Secondary | ICD-10-CM

## 2018-06-09 MED ORDER — DEXAMETHASONE SODIUM PHOSPHATE 10 MG/ML IJ SOLN
10.0000 mg | Freq: Once | INTRAMUSCULAR | Status: AC
  Administered 2018-06-09: 10 mg via INTRAVENOUS

## 2018-06-09 MED ORDER — SODIUM CHLORIDE 0.9% FLUSH
10.0000 mL | INTRAVENOUS | Status: DC | PRN
  Administered 2018-06-09: 10 mL
  Filled 2018-06-09: qty 10

## 2018-06-09 MED ORDER — SODIUM CHLORIDE 0.9 % IV SOLN
90.0000 mg/m2 | Freq: Once | INTRAVENOUS | Status: AC
  Administered 2018-06-09: 170 mg via INTRAVENOUS
  Filled 2018-06-09: qty 8.5

## 2018-06-09 MED ORDER — SODIUM CHLORIDE 0.9 % IV SOLN
Freq: Once | INTRAVENOUS | Status: AC
  Administered 2018-06-09: 11:00:00 via INTRAVENOUS

## 2018-06-09 MED ORDER — DEXAMETHASONE SODIUM PHOSPHATE 10 MG/ML IJ SOLN
INTRAMUSCULAR | Status: AC
Start: 2018-06-09 — End: ?
  Filled 2018-06-09: qty 1

## 2018-06-09 MED ORDER — HEPARIN SOD (PORK) LOCK FLUSH 100 UNIT/ML IV SOLN
500.0000 [IU] | Freq: Once | INTRAVENOUS | Status: AC | PRN
  Administered 2018-06-09: 500 [IU]
  Filled 2018-06-09: qty 5

## 2018-06-09 NOTE — Patient Instructions (Signed)
[facility] Discharge Instructions for Patients Receiving Chemotherapy  Today you received the following chemotherapy agents:  etoposide (Vepesid)  To help prevent nausea and vomiting after your treatment, we encourage you to take your nausea medication as prescribed.   If you develop nausea and vomiting that is not controlled by your nausea medication, call the clinic.   BELOW ARE SYMPTOMS THAT SHOULD BE REPORTED IMMEDIATELY:  *FEVER GREATER THAN 100.5 F  *CHILLS WITH OR WITHOUT FEVER  NAUSEA AND VOMITING THAT IS NOT CONTROLLED WITH YOUR NAUSEA MEDICATION  *UNUSUAL SHORTNESS OF BREATH  *UNUSUAL BRUISING OR BLEEDING  TENDERNESS IN MOUTH AND THROAT WITH OR WITHOUT PRESENCE OF ULCERS  *URINARY PROBLEMS  *BOWEL PROBLEMS  UNUSUAL RASH Items with * indicate a potential emergency and should be followed up as soon as possible.  Feel free to call the clinic should you have any questions or concerns. The clinic phone number is [phone].  Please show the [facility] at check-in to the Emergency Department and triage nurse.

## 2018-06-10 ENCOUNTER — Inpatient Hospital Stay: Payer: Medicare Other

## 2018-06-10 VITALS — BP 100/54 | HR 56 | Temp 97.5°F | Resp 16

## 2018-06-10 DIAGNOSIS — C3492 Malignant neoplasm of unspecified part of left bronchus or lung: Secondary | ICD-10-CM

## 2018-06-10 DIAGNOSIS — C3432 Malignant neoplasm of lower lobe, left bronchus or lung: Secondary | ICD-10-CM | POA: Diagnosis not present

## 2018-06-10 MED ORDER — PEGFILGRASTIM 6 MG/0.6ML ~~LOC~~ PSKT
PREFILLED_SYRINGE | SUBCUTANEOUS | Status: AC
  Filled 2018-06-10: qty 0.6

## 2018-06-10 MED ORDER — PROCHLORPERAZINE MALEATE 10 MG PO TABS
ORAL_TABLET | ORAL | Status: AC
  Filled 2018-06-10: qty 1

## 2018-06-10 MED ORDER — DEXAMETHASONE SODIUM PHOSPHATE 10 MG/ML IJ SOLN
10.0000 mg | Freq: Once | INTRAMUSCULAR | Status: AC
  Administered 2018-06-10: 10 mg via INTRAVENOUS

## 2018-06-10 MED ORDER — SODIUM CHLORIDE 0.9% FLUSH
10.0000 mL | INTRAVENOUS | Status: DC | PRN
  Administered 2018-06-10: 10 mL
  Filled 2018-06-10: qty 10

## 2018-06-10 MED ORDER — PEGFILGRASTIM 6 MG/0.6ML ~~LOC~~ PSKT
6.0000 mg | PREFILLED_SYRINGE | Freq: Once | SUBCUTANEOUS | Status: AC
  Administered 2018-06-10: 6 mg via SUBCUTANEOUS

## 2018-06-10 MED ORDER — HEPARIN SOD (PORK) LOCK FLUSH 100 UNIT/ML IV SOLN
500.0000 [IU] | Freq: Once | INTRAVENOUS | Status: AC | PRN
  Administered 2018-06-10: 500 [IU]
  Filled 2018-06-10: qty 5

## 2018-06-10 MED ORDER — PROCHLORPERAZINE MALEATE 10 MG PO TABS
10.0000 mg | ORAL_TABLET | Freq: Once | ORAL | Status: AC
  Administered 2018-06-10: 10 mg via ORAL

## 2018-06-10 MED ORDER — SODIUM CHLORIDE 0.9 % IV SOLN
Freq: Once | INTRAVENOUS | Status: AC
  Administered 2018-06-10: 14:00:00 via INTRAVENOUS

## 2018-06-10 MED ORDER — DEXAMETHASONE SODIUM PHOSPHATE 10 MG/ML IJ SOLN
INTRAMUSCULAR | Status: AC
  Filled 2018-06-10: qty 1

## 2018-06-10 MED ORDER — SODIUM CHLORIDE 0.9 % IV SOLN
90.0000 mg/m2 | Freq: Once | INTRAVENOUS | Status: AC
  Administered 2018-06-10: 170 mg via INTRAVENOUS
  Filled 2018-06-10: qty 8.5

## 2018-06-10 NOTE — Patient Instructions (Signed)
[facility] Discharge Instructions for Patients Receiving Chemotherapy  Today you received the following chemotherapy agents:  Etoposide.  To help prevent nausea and vomiting after your treatment, we encourage you to take your nausea medication as directed.   If you develop nausea and vomiting that is not controlled by your nausea medication, call the clinic.   BELOW ARE SYMPTOMS THAT SHOULD BE REPORTED IMMEDIATELY:  *FEVER GREATER THAN 100.5 F  *CHILLS WITH OR WITHOUT FEVER  NAUSEA AND VOMITING THAT IS NOT CONTROLLED WITH YOUR NAUSEA MEDICATION  *UNUSUAL SHORTNESS OF BREATH  *UNUSUAL BRUISING OR BLEEDING  TENDERNESS IN MOUTH AND THROAT WITH OR WITHOUT PRESENCE OF ULCERS  *URINARY PROBLEMS  *BOWEL PROBLEMS  UNUSUAL RASH Items with * indicate a potential emergency and should be followed up as soon as possible.  Feel free to call the clinic should you have any questions or concerns. The clinic phone number is [phone].  Please show the [facility] at check-in to the Emergency Department and triage nurse.

## 2018-06-16 ENCOUNTER — Other Ambulatory Visit (HOSPITAL_COMMUNITY)
Admission: RE | Admit: 2018-06-16 | Discharge: 2018-06-16 | Disposition: A | Discharge status: Home / Self Care | Payer: Medicare Other | Source: Ambulatory Visit | Attending: Internal Medicine | Admitting: Internal Medicine

## 2018-06-16 DIAGNOSIS — C3492 Malignant neoplasm of unspecified part of left bronchus or lung: Secondary | ICD-10-CM | POA: Diagnosis present

## 2018-06-16 LAB — CBC WITH DIFFERENTIAL/PLATELET
Basophils Absolute: 0 10*3/uL (ref 0.0–0.1)
Basophils Relative: 0 %
EOS ABS: 0.1 10*3/uL (ref 0.0–0.7)
Eosinophils Relative: 2 %
HCT: 32.7 % — ABNORMAL LOW (ref 36.0–46.0)
HEMOGLOBIN: 11.2 g/dL — AB (ref 12.0–15.0)
LYMPHS ABS: 1.3 10*3/uL (ref 0.7–4.0)
Lymphocytes Relative: 32 %
MCH: 32.8 pg (ref 26.0–34.0)
MCHC: 34.3 g/dL (ref 30.0–36.0)
MCV: 95.9 fL (ref 78.0–100.0)
MONO ABS: 0.5 10*3/uL (ref 0.1–1.0)
Monocytes Relative: 13 %
NEUTROS ABS: 2.1 10*3/uL (ref 1.7–7.7)
Neutrophils Relative %: 53 %
Platelets: 61 10*3/uL — ABNORMAL LOW (ref 150–400)
RBC: 3.41 MIL/uL — ABNORMAL LOW (ref 3.87–5.11)
RDW: 17 % — AB (ref 11.5–15.5)
WBC: 4 10*3/uL (ref 4.0–10.5)

## 2018-06-16 LAB — COMPREHENSIVE METABOLIC PANEL
ALK PHOS: 69 U/L (ref 38–126)
ALT: 10 U/L (ref 0–44)
ANION GAP: 9 (ref 5–15)
AST: 11 U/L — ABNORMAL LOW (ref 15–41)
Albumin: 3.7 g/dL (ref 3.5–5.0)
BUN: 11 mg/dL (ref 8–23)
CALCIUM: 8.4 mg/dL — AB (ref 8.9–10.3)
CO2: 26 mmol/L (ref 22–32)
Chloride: 104 mmol/L (ref 98–111)
Creatinine, Ser: 0.63 mg/dL (ref 0.44–1.00)
GFR calc non Af Amer: >60 mL/min (ref >60)
Glucose, Bld: 110 mg/dL — ABNORMAL HIGH (ref 70–99)
POTASSIUM: 3.7 mmol/L (ref 3.5–5.1)
SODIUM: 139 mmol/L (ref 135–145)
TOTAL PROTEIN: 6.8 g/dL (ref 6.5–8.1)
Total Bilirubin: 0.8 mg/dL (ref 0.3–1.2)

## 2018-06-25 ENCOUNTER — Other Ambulatory Visit: Payer: Medicare Other

## 2018-06-25 ENCOUNTER — Ambulatory Visit: Payer: Medicare Other

## 2018-06-25 ENCOUNTER — Ambulatory Visit: Payer: Medicare Other | Admitting: Internal Medicine

## 2018-06-29 ENCOUNTER — Ambulatory Visit (HOSPITAL_COMMUNITY)
Admission: RE | Admit: 2018-06-29 | Discharge: 2018-06-29 | Disposition: A | Discharge status: Home / Self Care | Payer: Medicare Other | Source: Ambulatory Visit | Attending: Internal Medicine | Admitting: Internal Medicine

## 2018-06-29 DIAGNOSIS — C3491 Malignant neoplasm of unspecified part of right bronchus or lung: Secondary | ICD-10-CM | POA: Diagnosis not present

## 2018-06-29 DIAGNOSIS — I700 Atherosclerosis of aorta: Secondary | ICD-10-CM | POA: Insufficient documentation

## 2018-06-29 DIAGNOSIS — C787 Secondary malignant neoplasm of liver and intrahepatic bile duct: Secondary | ICD-10-CM | POA: Insufficient documentation

## 2018-06-29 DIAGNOSIS — C3412 Malignant neoplasm of upper lobe, left bronchus or lung: Secondary | ICD-10-CM

## 2018-06-29 DIAGNOSIS — M899 Disorder of bone, unspecified: Secondary | ICD-10-CM | POA: Insufficient documentation

## 2018-06-29 DIAGNOSIS — C3492 Malignant neoplasm of unspecified part of left bronchus or lung: Secondary | ICD-10-CM | POA: Diagnosis not present

## 2018-06-29 MED ORDER — IOPAMIDOL (ISOVUE-300) INJECTION 61%
100.0000 mL | Freq: Once | INTRAVENOUS | Status: AC | PRN
  Administered 2018-06-29: 100 mL via INTRAVENOUS

## 2018-06-29 MED ORDER — IOPAMIDOL (ISOVUE-300) INJECTION 61%
INTRAVENOUS | Status: AC
  Filled 2018-06-29: qty 100

## 2018-06-30 ENCOUNTER — Inpatient Hospital Stay (HOSPITAL_BASED_OUTPATIENT_CLINIC_OR_DEPARTMENT_OTHER): Payer: Medicare Other | Admitting: Internal Medicine

## 2018-06-30 ENCOUNTER — Inpatient Hospital Stay: Payer: Medicare Other

## 2018-06-30 ENCOUNTER — Encounter: Payer: Self-pay | Admitting: Internal Medicine

## 2018-06-30 ENCOUNTER — Telehealth: Payer: Self-pay | Admitting: Internal Medicine

## 2018-06-30 VITALS — BP 106/64 | HR 72 | Temp 98.3°F | Resp 18 | Ht 64.0 in | Wt 167.5 lb

## 2018-06-30 DIAGNOSIS — R112 Nausea with vomiting, unspecified: Secondary | ICD-10-CM

## 2018-06-30 DIAGNOSIS — C787 Secondary malignant neoplasm of liver and intrahepatic bile duct: Secondary | ICD-10-CM

## 2018-06-30 DIAGNOSIS — Z5112 Encounter for antineoplastic immunotherapy: Secondary | ICD-10-CM

## 2018-06-30 DIAGNOSIS — C7951 Secondary malignant neoplasm of bone: Secondary | ICD-10-CM

## 2018-06-30 DIAGNOSIS — C3492 Malignant neoplasm of unspecified part of left bronchus or lung: Secondary | ICD-10-CM

## 2018-06-30 DIAGNOSIS — E860 Dehydration: Secondary | ICD-10-CM

## 2018-06-30 DIAGNOSIS — C3432 Malignant neoplasm of lower lobe, left bronchus or lung: Secondary | ICD-10-CM | POA: Diagnosis not present

## 2018-06-30 DIAGNOSIS — J449 Chronic obstructive pulmonary disease, unspecified: Secondary | ICD-10-CM

## 2018-06-30 DIAGNOSIS — C3412 Malignant neoplasm of upper lobe, left bronchus or lung: Secondary | ICD-10-CM

## 2018-06-30 DIAGNOSIS — R110 Nausea: Secondary | ICD-10-CM

## 2018-06-30 DIAGNOSIS — R5382 Chronic fatigue, unspecified: Secondary | ICD-10-CM

## 2018-06-30 DIAGNOSIS — Z7189 Other specified counseling: Secondary | ICD-10-CM

## 2018-06-30 LAB — CBC WITH DIFFERENTIAL (CANCER CENTER ONLY)
BASOS PCT: 0 %
Basophils Absolute: 0 10*3/uL (ref 0.0–0.1)
EOS ABS: 0.1 10*3/uL (ref 0.0–0.5)
EOS PCT: 1 %
HCT: 27.4 % — ABNORMAL LOW (ref 34.8–46.6)
Hemoglobin: 9.1 g/dL — ABNORMAL LOW (ref 11.6–15.9)
Lymphocytes Relative: 22 %
Lymphs Abs: 1.2 10*3/uL (ref 0.9–3.3)
MCH: 33 pg (ref 25.1–34.0)
MCHC: 33.2 g/dL (ref 31.5–36.0)
MCV: 99.3 fL (ref 79.5–101.0)
MONOS PCT: 15 %
Monocytes Absolute: 0.8 10*3/uL (ref 0.1–0.9)
Neutro Abs: 3.5 10*3/uL (ref 1.5–6.5)
Neutrophils Relative %: 62 %
PLATELETS: 101 10*3/uL — AB (ref 145–400)
RBC: 2.76 MIL/uL — ABNORMAL LOW (ref 3.70–5.45)
RDW: 20.3 % — ABNORMAL HIGH (ref 11.2–14.5)
WBC Count: 5.6 10*3/uL (ref 3.9–10.3)

## 2018-06-30 LAB — TSH: TSH: 3.032 u[IU]/mL (ref 0.308–3.960)

## 2018-06-30 LAB — CMP (CANCER CENTER ONLY)
ALBUMIN: 3.6 g/dL (ref 3.5–5.0)
ALT: <6 U/L (ref 0–44)
ANION GAP: 8 (ref 5–15)
AST: 10 U/L — ABNORMAL LOW (ref 15–41)
Alkaline Phosphatase: 56 U/L (ref 38–126)
BUN: 10 mg/dL (ref 8–23)
CALCIUM: 8.3 mg/dL — AB (ref 8.9–10.3)
CHLORIDE: 105 mmol/L (ref 98–111)
CO2: 28 mmol/L (ref 22–32)
Creatinine: 0.68 mg/dL (ref 0.44–1.00)
GFR, Estimated: >60 mL/min (ref >60)
Glucose, Bld: 93 mg/dL (ref 70–99)
POTASSIUM: 3.8 mmol/L (ref 3.5–5.1)
SODIUM: 141 mmol/L (ref 135–145)
Total Bilirubin: 0.5 mg/dL (ref 0.3–1.2)
Total Protein: 6.3 g/dL — ABNORMAL LOW (ref 6.5–8.1)

## 2018-06-30 MED ORDER — SODIUM CHLORIDE 0.9 % IV SOLN
1200.0000 mg | Freq: Once | INTRAVENOUS | Status: AC
  Administered 2018-06-30: 1200 mg via INTRAVENOUS
  Filled 2018-06-30: qty 20

## 2018-06-30 MED ORDER — SODIUM CHLORIDE 0.9 % IV SOLN
Freq: Once | INTRAVENOUS | Status: AC
  Administered 2018-06-30: 10:00:00 via INTRAVENOUS
  Filled 2018-06-30: qty 250

## 2018-06-30 MED ORDER — ONDANSETRON 4 MG PO TBDP: ORAL_TABLET | ORAL | 1 refills | Status: DC

## 2018-06-30 MED ORDER — HEPARIN SOD (PORK) LOCK FLUSH 100 UNIT/ML IV SOLN
500.0000 [IU] | Freq: Once | INTRAVENOUS | Status: AC | PRN
  Administered 2018-06-30: 500 [IU]
  Filled 2018-06-30: qty 5

## 2018-06-30 MED ORDER — SODIUM CHLORIDE 0.9% FLUSH
10.0000 mL | INTRAVENOUS | Status: DC | PRN
  Administered 2018-06-30: 10 mL
  Filled 2018-06-30: qty 10

## 2018-06-30 NOTE — Telephone Encounter (Signed)
Appointments scheduled AVS/Calendar printed per 7/30 los °

## 2018-06-30 NOTE — Progress Notes (Signed)
[place] Telephone:([phone]) [phone]   Fax:([phone]) [phone]  OFFICE PROGRESS NOTE  [employer] School, MD [address] [place] [address]  DIAGNOSIS: Metastatic small cell lung cancer initially diagnosed as Limited stage (T1b, N2, M0) small cell lung cancer presented with left lower lobe/infrahilar mass and large mediastinal lymphadenopathy diagnosed in October 2017.  PRIOR THERAPY: 1) Systemic chemotherapy with cisplatin 60 MG/M2 on day 1 and etoposide 120 MG/M2 on days 1, 2 and 3 status post 1 cycle. This was discontinued secondary to intolerance. 2) Systemic chemotherapy with carboplatin for AUC of 4 on day 1 and etoposide 100 MG/M2 on days 1, 2 and 3 with Neulasta support on day 4 every 3 weeks. Status post 5 cycles. This was concurrent with radiation in [place], [place]. 3) prophylactic cranial irradiation. 4) Systemic chemotherapy with carboplatin for AUC of 5 on day 1, etoposide 100 mg/M2 on days 1, 2 and 3 as well as a Tecentriq (Atezolizumab) 1200 mg IV every 3 weeks with Neulasta support.  First dose February 17, 2018.  Status post 6 cycles of partial response.   CURRENT THERAPY: Maintenance treatment with immunotherapy with Tecentriq 1200 mg IV every 3 weeks.  First dose June 30, 2018.  INTERVAL HISTORY: Rebekah Henderson 62 y.o. female returns to the clinic today for follow-up visit accompanied by friend.  The patient is feeling fine today with no specific complaints except for mild fatigue.  She is recovering well from the adverse effect of the last chemotherapy.  She denied having any chest pain, shortness of breath, cough or hemoptysis.  She denied having any recent weight loss or night sweats.  She has no nausea, vomiting, diarrhea or constipation.  The patient had repeat CT scan of the chest, abdomen and pelvis performed recently and she is here for evaluation and discussion of her risk her results and treatment options.  MEDICAL HISTORY: Past Medical  History:  Diagnosis Date  . 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 Anxiety    takes Prozac daily  . Arthritis   . Benign fundic gland polyps of stomach   . Colon polyps   . COPD (chronic obstructive pulmonary disease) ([place])   . Dehydration 03/06/2017  . Diverticulitis   . Dyspnea    with exertion  . Encounter for antineoplastic chemotherapy 12/03/2016  . Fibromyalgia   . GERD (gastroesophageal reflux disease)    takes Pantoprazole daily  . Hypertension    takes Metoprolol,Triamterene-HCTZ,and Amlodipine daily  . Hypothyroidism    takes Synthroid daily  . IBS (irritable bowel syndrome)   . lung ca dx'd 10/02/2016   skin, lung  . PONV (postoperative nausea and vomiting)    pt also states that she had some difficulty breathing after cervical fusion    ALLERGIES:  is allergic to penicillins; codeine; fentanyl; ranitidine hcl; keflex [cephalexin]; and lyrica [pregabalin].  MEDICATIONS:  Current Outpatient Medications  Medication Sig Dispense Refill  . albuterol (PROVENTIL HFA;VENTOLIN HFA) 108 (90 Base) MCG/ACT inhaler Inhale 2 puffs into the lungs every 4 (four) hours as needed for wheezing or shortness of breath.    . ALPRAZolam (XANAX) 0.25 MG tablet Take 2 tablets (0.5 mg total) by mouth at bedtime as needed for anxiety. 30 tablet 0  . amLODipine (NORVASC) 5 MG tablet Take 5 mg by mouth daily.      Marland Kitchen dicyclomine (BENTYL) 20 MG tablet Take 20 mg by mouth 3 (three) times daily as needed for spasms.    Marland Kitchen  estazolam (PROSOM) 2 MG tablet Take 2 mg by mouth at bedtime.    Marland Kitchen estradiol (ESTRACE) 2 MG tablet Take 2 mg by mouth daily.      Marland Kitchen FeFum-FePoly-FA-B Cmp-C-Biot (INTEGRA PLUS) CAPS TAKE 1 CAPSULE BY MOUTH ONCE DAILY IN THE MORNING. 30 capsule 0  . FLUoxetine (PROZAC) 40 MG capsule Take 40 mg by mouth daily.   2  . HYDROcodone-acetaminophen (NORCO/VICODIN) 5-325 MG tablet Take 1 tablet by mouth every 4 (four) hours as needed for moderate pain.   0  . levothyroxine  (SYNTHROID, LEVOTHROID) 50 MCG tablet Take 50 mcg by mouth daily before breakfast.     . lidocaine-prilocaine (EMLA) cream Apply 1 application topically as needed. To numb skin over port a cath: Squeeze a  small amount on cotton ball and place over port site 1-2 hours prior to chemotherapy. 30 g 0  . loperamide (IMODIUM) 2 MG capsule Take 2 mg by mouth every 2 (two) hours as needed for diarrhea or loose stools.    . metoprolol succinate (TOPROL-XL) 25 MG 24 hr tablet Take 25 mg by mouth daily.    . ondansetron (ZOFRAN-ODT) 4 MG disintegrating tablet DISSOLVE 1 TABLET BY MOUTH EVERY 8 HOURS AS NEEDED FOR NAUSEA AND VOMITING. 20 tablet 1  . pantoprazole (PROTONIX) 40 MG tablet Take 40 mg by mouth 2 (two) times daily before a meal.   10  . potassium chloride SA (K-DUR,KLOR-CON) 20 MEQ tablet Take 1 tablet twice a day for 7 days then 1 tablet daily. 30 tablet 0  . promethazine (PHENERGAN) 25 MG tablet Take 1 tablet (25 mg total) by mouth every 6 (six) hours as needed. 30 tablet 10  . senna-docusate (SENOKOT-S) 8.6-50 MG tablet Take 1 tablet by mouth daily as needed.     No current facility-administered medications for this visit.     SURGICAL HISTORY:  Past Surgical History:  Procedure Laterality Date  . BIOPSY N/A 05/25/2013   Procedure: BIOPSIES (Random Colon; Duodenal; Gastric);  Surgeon: Danie Binder, MD;  Location: [facility];  Service: Endoscopy;  Laterality: N/A;  . BLADDER SUSPENSION    . BREAST ENHANCEMENT SURGERY    . BREAST IMPLANT REMOVAL    . CERVICAL FUSION  AUG 2013  . CHOLECYSTECTOMY  1999  . COLONOSCOPY  2007 [place]   POLYPS  . COLONOSCOPY WITH PROPOFOL N/A 05/25/2013   Procedure: COLONOSCOPY WITH PROPOFOL(at cecum 0957) total withdrawal time=21min);  Surgeon: Danie Binder, MD;  Location: [facility];  Service: Endoscopy;  Laterality: N/A;  . ESOPHAGOGASTRODUODENOSCOPY (EGD) WITH PROPOFOL N/A 05/25/2013   Procedure: ESOPHAGOGASTRODUODENOSCOPY (EGD) WITH PROPOFOL;  Surgeon: Danie Binder, MD;  Location: [facility];  Service: Endoscopy;  Laterality: N/A;  . FOOT SURGERY    . IR FLUORO GUIDE PORT INSERTION RIGHT  03/04/2018  . IR US GUIDE VASC ACCESS RIGHT  03/04/2018  . POLYPECTOMY N/A 05/25/2013   Procedure: POLYPECTOMY (Rectal and Gastric);  Surgeon: Danie Binder, MD;  Location: [facility];  Service: Endoscopy;  Laterality: N/A;  . TONSILLECTOMY    . UPPER GASTROINTESTINAL ENDOSCOPY    . VIDEO BRONCHOSCOPY WITH ENDOBRONCHIAL ULTRASOUND  09/12/2016   Procedure: VIDEO BRONCHOSCOPY WITH ENDOBRONCHIAL ULTRASOUND AND BIOPSY;  Surgeon: Juanito Doom, MD;  Location: [facility];  Service: Cardiopulmonary;;    REVIEW OF SYSTEMS:  Constitutional: positive for fatigue Eyes: negative Ears, nose, mouth, throat, and face: negative Respiratory: negative Cardiovascular: negative Gastrointestinal: negative Genitourinary:negative Integument/breast: negative Hematologic/lymphatic: negative Musculoskeletal:negative Neurological: negative Behavioral/Psych: negative Endocrine:  negative Allergic/Immunologic: negative   PHYSICAL EXAMINATION: General appearance: alert, cooperative, fatigued and no distress Head: Normocephalic, without obvious abnormality, atraumatic Neck: no adenopathy, no JVD, supple, symmetrical, trachea midline and thyroid not enlarged, symmetric, no tenderness/mass/nodules Lymph nodes: Cervical, supraclavicular, and axillary nodes normal. Resp: clear to auscultation bilaterally Back: symmetric, no curvature. ROM normal. No CVA tenderness. Cardio: regular rate and rhythm, S1, S2 normal, no murmur, click, rub or gallop GI: soft, non-tender; bowel sounds normal; no masses,  no organomegaly Extremities: extremities normal, atraumatic, no cyanosis or edema Neurologic: Alert and oriented X 3, normal strength and tone. Normal symmetric reflexes. Normal coordination and gait   ECOG PERFORMANCE STATUS: 1 - Symptomatic but completely ambulatory  Blood pressure 106/64, pulse 72,  temperature 98.3 F (36.8 C), temperature source Oral, resp. rate 18, height 5\' 4"  (1.626 m), weight 167 lb 8 oz (76 kg), SpO2 97 %.  LABORATORY DATA: Lab Results  Component Value Date   WBC 5.6 06/30/2018   HGB 9.1 (L) 06/30/2018   HCT 27.4 (L) 06/30/2018   MCV 99.3 06/30/2018   PLT 101 (L) 06/30/2018      Chemistry      Component Value Date/Time   NA 139 06/16/2018 1008   NA 141 09/08/2017 1009   K 3.7 06/16/2018 1008   K 3.5 09/08/2017 1009   CL 104 06/16/2018 1008   CO2 26 06/16/2018 1008   CO2 29 09/08/2017 1009   BUN 11 06/16/2018 1008   BUN 5.8 (L) 09/08/2017 1009   CREATININE 0.63 06/16/2018 1008   CREATININE 0.71 06/08/2018 0833   CREATININE 0.7 09/08/2017 1009      Component Value Date/Time   CALCIUM 8.4 (L) 06/16/2018 1008   CALCIUM 9.0 09/08/2017 1009   ALKPHOS 69 06/16/2018 1008   ALKPHOS 49 09/08/2017 1009   AST 11 (L) 06/16/2018 1008   AST 14 (L) 06/08/2018 0833   AST 9 09/08/2017 1009   ALT 10 06/16/2018 1008   ALT 9 06/08/2018 0833   ALT <6 09/08/2017 1009   BILITOT 0.8 06/16/2018 1008   BILITOT 0.6 06/08/2018 0833   BILITOT 0.51 09/08/2017 1009       RADIOGRAPHIC STUDIES: Ct Chest W Contrast  Result Date: 06/29/2018 CLINICAL DATA:  Bilateral small-cell lung cancer with bone metastases and liver metastases. EXAM: CT CHEST, ABDOMEN, AND PELVIS WITH CONTRAST TECHNIQUE: Multidetector CT imaging of the chest, abdomen and pelvis was performed following the standard protocol during bolus administration of intravenous contrast. CONTRAST:  188mL ISOVUE-300 IOPAMIDOL (ISOVUE-300) INJECTION 61% COMPARISON:  04/20/2018 FINDINGS: CT CHEST FINDINGS Cardiovascular: The heart size is normal. No substantial pericardial effusion. Trace pericardial effusion similar to prior. Atherosclerotic calcification is noted in the wall of the thoracic aorta. Right Port-A-Cath tip is positioned in the distal SVC. Mediastinum/Nodes: Abnormal soft tissue attenuation in the AP  window has decreased in the interval measuring 1.8 x 2.7 cm today compared to 3.0 x 3.1 cm previously. Treatment related changes in the left hilum are similar to prior. No new mediastinal or hilar lymphadenopathy evident. Small left axillary lymph nodes are stable. Small to moderate hiatal hernia with unremarkable esophagus. Lungs/Pleura: The central tracheobronchial airways are patent. Radiation scarring in the medial left lung with associated architectural distortion is again noted with progressive interstitial and airspace disease in the posterior left upper lobe. Volume loss left hemithorax is similar to prior 6 mm right lower lobe nodule (4:95) is new in the interval. Ill-defined 4 mm right lower lobe nodule (4:91) also new  since prior study. Scarring in the anterior left lower lobe is similar to prior. Musculoskeletal: No worrisome lytic or sclerotic osseous abnormality. CT ABDOMEN PELVIS FINDINGS Hepatobiliary: Segment III lesion identified previously is not evident today. No new focal liver abnormality. Gallbladder surgically absent. Common bile duct similar to prior at 9 mm diameter. Pancreas: Mild, but stable stable prominence of main pancreatic duct in the head of the pancreas. No pancreatic head mass evident. Spleen: No splenomegaly. No focal mass lesion. Adrenals/Urinary Tract: No adrenal nodule or mass. Small cyst upper pole right kidney is stable. Left kidney unremarkable. No evidence for hydroureter. The urinary bladder appears normal for the degree of distention. Stomach/Bowel: Small to moderate hiatal hernia. Stomach otherwise unremarkable. Duodenum is normally positioned as is the ligament of Treitz. Duodenal diverticulum evident. No small bowel wall thickening. No small bowel dilatation. The terminal ileum is normal. The appendix is not visualized, but there is no edema or inflammation in the region of the cecum. Diverticular changes are noted in the left colon without evidence of diverticulitis.  Vascular/Lymphatic: There is abdominal aortic atherosclerosis without aneurysm. There is no gastrohepatic or hepatoduodenal ligament lymphadenopathy. No intraperitoneal or retroperitoneal lymphadenopathy. No pelvic sidewall lymphadenopathy. Reproductive: Uterus surgically absent.  There is no adnexal mass. Other: No intraperitoneal free fluid. Musculoskeletal: Stable small sclerotic foci posterior left acetabulum and left femoral head. 18 mm sclerotic lesion in the L3 vertebral body is stable. IMPRESSION: 1. No new or progressive findings on today's exam with continued further decrease in size of recurrent disease in the AP window. 2. Left hepatic metastatic lesions seen on the previous study is no longer evident. 3. Stable appearance sclerotic focus in the L3 vertebral body. 4.  Aortic Atherosclerois (ICD10-170.0) Electronically Signed   By: Misty Stanley M.D.   On: 06/29/2018 12:06   Ct Abdomen Pelvis W Contrast  Result Date: 06/29/2018 CLINICAL DATA:  Bilateral small-cell lung cancer with bone metastases and liver metastases. EXAM: CT CHEST, ABDOMEN, AND PELVIS WITH CONTRAST TECHNIQUE: Multidetector CT imaging of the chest, abdomen and pelvis was performed following the standard protocol during bolus administration of intravenous contrast. CONTRAST:  151mL ISOVUE-300 IOPAMIDOL (ISOVUE-300) INJECTION 61% COMPARISON:  04/20/2018 FINDINGS: CT CHEST FINDINGS Cardiovascular: The heart size is normal. No substantial pericardial effusion. Trace pericardial effusion similar to prior. Atherosclerotic calcification is noted in the wall of the thoracic aorta. Right Port-A-Cath tip is positioned in the distal SVC. Mediastinum/Nodes: Abnormal soft tissue attenuation in the AP window has decreased in the interval measuring 1.8 x 2.7 cm today compared to 3.0 x 3.1 cm previously. Treatment related changes in the left hilum are similar to prior. No new mediastinal or hilar lymphadenopathy evident. Small left axillary lymph  nodes are stable. Small to moderate hiatal hernia with unremarkable esophagus. Lungs/Pleura: The central tracheobronchial airways are patent. Radiation scarring in the medial left lung with associated architectural distortion is again noted with progressive interstitial and airspace disease in the posterior left upper lobe. Volume loss left hemithorax is similar to prior 6 mm right lower lobe nodule (4:95) is new in the interval. Ill-defined 4 mm right lower lobe nodule (4:91) also new since prior study. Scarring in the anterior left lower lobe is similar to prior. Musculoskeletal: No worrisome lytic or sclerotic osseous abnormality. CT ABDOMEN PELVIS FINDINGS Hepatobiliary: Segment III lesion identified previously is not evident today. No new focal liver abnormality. Gallbladder surgically absent. Common bile duct similar to prior at 9 mm diameter. Pancreas: Mild, but stable stable prominence of  main pancreatic duct in the head of the pancreas. No pancreatic head mass evident. Spleen: No splenomegaly. No focal mass lesion. Adrenals/Urinary Tract: No adrenal nodule or mass. Small cyst upper pole right kidney is stable. Left kidney unremarkable. No evidence for hydroureter. The urinary bladder appears normal for the degree of distention. Stomach/Bowel: Small to moderate hiatal hernia. Stomach otherwise unremarkable. Duodenum is normally positioned as is the ligament of Treitz. Duodenal diverticulum evident. No small bowel wall thickening. No small bowel dilatation. The terminal ileum is normal. The appendix is not visualized, but there is no edema or inflammation in the region of the cecum. Diverticular changes are noted in the left colon without evidence of diverticulitis. Vascular/Lymphatic: There is abdominal aortic atherosclerosis without aneurysm. There is no gastrohepatic or hepatoduodenal ligament lymphadenopathy. No intraperitoneal or retroperitoneal lymphadenopathy. No pelvic sidewall lymphadenopathy.  Reproductive: Uterus surgically absent.  There is no adnexal mass. Other: No intraperitoneal free fluid. Musculoskeletal: Stable small sclerotic foci posterior left acetabulum and left femoral head. 18 mm sclerotic lesion in the L3 vertebral body is stable. IMPRESSION: 1. No new or progressive findings on today's exam with continued further decrease in size of recurrent disease in the [facility] window. 2. Left hepatic metastatic lesions seen on the previous study is no longer evident. 3. Stable appearance sclerotic focus in the L3 vertebral body. 4.  Aortic Atherosclerois (ICD10-170.0) Electronically Signed   By: Misty Stanley M.D.   On: 06/29/2018 12:06    ASSESSMENT AND PLAN:  This is a very pleasant 62 years old white female was limited stage small cell lung cancer, status post 6 cycles of systemic chemotherapy with carboplatin and etoposide concurrent with radiation and followed by prophylactic cranial irradiation. Patient has been in observation for more than a year.  She has been doing fine but recently started complaining of increasing fatigue and weakness as well as headache. The patient had evidence for disease recurrence and she was a started on treatment with systemic chemotherapy with carboplatin, etoposide and Tecentriq.  She is status post 6 cycles.  She tolerated this treatment well with no concerning complaints except for fatigue and pancytopenia. Repeat CT scan of the chest, abdomen and pelvis showed further improvement of her disease with no concerning findings for disease progression. I personally and independently reviewed the scans and discussed the results with the patient and her friend today. I recommended for the patient to continue maintenance treatment with Tecentriq Huey Bienenstock) every 3 weeks and she will start the first dose of this treatment today. For the nausea, I will give her a refill of Zofran. She will come back for follow-up visit in 3 weeks with the next cycle of her  treatment. The patient was advised to call immediately if she has any concerning symptoms in the interval. The patient voices understanding of current disease status and treatment options and is in agreement with the current care plan. All questions were answered. The patient knows to call the clinic with any problems, questions or concerns. We can certainly see the patient much sooner if necessary. I spent 15 minutes counseling the patient face to face. The total time spent in the appointment was 25 minutes.  Disclaimer: This note was dictated with voice recognition software. Similar sounding words can inadvertently be transcribed and may not be corrected upon review.

## 2018-06-30 NOTE — Progress Notes (Signed)
PA for Ondansetron 4 mg has been submitted.

## 2018-07-01 DIAGNOSIS — J449 Chronic obstructive pulmonary disease, unspecified: Secondary | ICD-10-CM | POA: Diagnosis not present

## 2018-07-01 DIAGNOSIS — R0902 Hypoxemia: Secondary | ICD-10-CM | POA: Diagnosis not present

## 2018-07-21 DIAGNOSIS — G894 Chronic pain syndrome: Secondary | ICD-10-CM | POA: Diagnosis not present

## 2018-07-21 DIAGNOSIS — Z6828 Body mass index (BMI) 28.0-28.9, adult: Secondary | ICD-10-CM | POA: Diagnosis not present

## 2018-07-21 DIAGNOSIS — J449 Chronic obstructive pulmonary disease, unspecified: Secondary | ICD-10-CM | POA: Diagnosis not present

## 2018-07-21 DIAGNOSIS — Z0000 Encounter for general adult medical examination without abnormal findings: Secondary | ICD-10-CM | POA: Diagnosis not present

## 2018-07-22 ENCOUNTER — Inpatient Hospital Stay (HOSPITAL_BASED_OUTPATIENT_CLINIC_OR_DEPARTMENT_OTHER): Payer: Medicare Other | Admitting: Oncology

## 2018-07-22 ENCOUNTER — Inpatient Hospital Stay: Payer: Medicare Other

## 2018-07-22 ENCOUNTER — Inpatient Hospital Stay: Payer: Medicare Other | Attending: Internal Medicine

## 2018-07-22 ENCOUNTER — Encounter: Payer: Self-pay | Admitting: Oncology

## 2018-07-22 ENCOUNTER — Telehealth: Payer: Self-pay | Admitting: Oncology

## 2018-07-22 VITALS — BP 125/83 | HR 85 | Temp 98.1°F | Resp 18 | Ht 64.0 in | Wt 162.6 lb

## 2018-07-22 DIAGNOSIS — I10 Essential (primary) hypertension: Secondary | ICD-10-CM | POA: Diagnosis not present

## 2018-07-22 DIAGNOSIS — Z5112 Encounter for antineoplastic immunotherapy: Secondary | ICD-10-CM

## 2018-07-22 DIAGNOSIS — D61818 Other pancytopenia: Secondary | ICD-10-CM | POA: Insufficient documentation

## 2018-07-22 DIAGNOSIS — R5383 Other fatigue: Secondary | ICD-10-CM | POA: Insufficient documentation

## 2018-07-22 DIAGNOSIS — Z79899 Other long term (current) drug therapy: Secondary | ICD-10-CM | POA: Insufficient documentation

## 2018-07-22 DIAGNOSIS — C7951 Secondary malignant neoplasm of bone: Secondary | ICD-10-CM | POA: Diagnosis not present

## 2018-07-22 DIAGNOSIS — R5382 Chronic fatigue, unspecified: Secondary | ICD-10-CM

## 2018-07-22 DIAGNOSIS — C3412 Malignant neoplasm of upper lobe, left bronchus or lung: Secondary | ICD-10-CM

## 2018-07-22 DIAGNOSIS — E039 Hypothyroidism, unspecified: Secondary | ICD-10-CM | POA: Insufficient documentation

## 2018-07-22 DIAGNOSIS — C787 Secondary malignant neoplasm of liver and intrahepatic bile duct: Secondary | ICD-10-CM | POA: Insufficient documentation

## 2018-07-22 DIAGNOSIS — C3492 Malignant neoplasm of unspecified part of left bronchus or lung: Secondary | ICD-10-CM

## 2018-07-22 DIAGNOSIS — R110 Nausea: Secondary | ICD-10-CM

## 2018-07-22 LAB — CBC WITH DIFFERENTIAL (CANCER CENTER ONLY)
Basophils Absolute: 0 10*3/uL (ref 0.0–0.1)
Basophils Relative: 0 %
EOS ABS: 0.2 10*3/uL (ref 0.0–0.5)
Eosinophils Relative: 4 %
HCT: 30.4 % — ABNORMAL LOW (ref 34.8–46.6)
HEMOGLOBIN: 10.1 g/dL — AB (ref 11.6–15.9)
LYMPHS ABS: 1 10*3/uL (ref 0.9–3.3)
Lymphocytes Relative: 23 %
MCH: 32.6 pg (ref 25.1–34.0)
MCHC: 33.4 g/dL (ref 31.5–36.0)
MCV: 97.7 fL (ref 79.5–101.0)
Monocytes Absolute: 0.4 10*3/uL (ref 0.1–0.9)
Monocytes Relative: 10 %
Neutro Abs: 2.7 10*3/uL (ref 1.5–6.5)
Neutrophils Relative %: 63 %
PLATELETS: 191 10*3/uL (ref 145–400)
RBC: 3.11 MIL/uL — ABNORMAL LOW (ref 3.70–5.45)
RDW: 18.8 % — ABNORMAL HIGH (ref 11.2–14.5)
WBC Count: 4.4 10*3/uL (ref 3.9–10.3)

## 2018-07-22 LAB — CMP (CANCER CENTER ONLY)
ALK PHOS: 52 U/L (ref 38–126)
ALT: <6 U/L (ref 0–44)
ANION GAP: 8 (ref 5–15)
AST: 10 U/L — ABNORMAL LOW (ref 15–41)
Albumin: 3.7 g/dL (ref 3.5–5.0)
BUN: 7 mg/dL — ABNORMAL LOW (ref 8–23)
CALCIUM: 9.1 mg/dL (ref 8.9–10.3)
CO2: 28 mmol/L (ref 22–32)
Chloride: 105 mmol/L (ref 98–111)
Creatinine: 0.66 mg/dL (ref 0.44–1.00)
GFR, Estimated: >60 mL/min (ref >60)
Glucose, Bld: 87 mg/dL (ref 70–99)
Potassium: 3.4 mmol/L — ABNORMAL LOW (ref 3.5–5.1)
SODIUM: 141 mmol/L (ref 135–145)
Total Bilirubin: 0.6 mg/dL (ref 0.3–1.2)
Total Protein: 6.9 g/dL (ref 6.5–8.1)

## 2018-07-22 MED ORDER — SODIUM CHLORIDE 0.9 % IV SOLN
Freq: Once | INTRAVENOUS | Status: AC
  Administered 2018-07-22: 15:00:00 via INTRAVENOUS
  Filled 2018-07-22: qty 250

## 2018-07-22 MED ORDER — HEPARIN SOD (PORK) LOCK FLUSH 100 UNIT/ML IV SOLN
500.0000 [IU] | Freq: Once | INTRAVENOUS | Status: AC | PRN
  Administered 2018-07-22: 500 [IU]
  Filled 2018-07-22: qty 5

## 2018-07-22 MED ORDER — SODIUM CHLORIDE 0.9 % IV SOLN
1200.0000 mg | Freq: Once | INTRAVENOUS | Status: AC
  Administered 2018-07-22: 1200 mg via INTRAVENOUS
  Filled 2018-07-22: qty 20

## 2018-07-22 MED ORDER — SODIUM CHLORIDE 0.9% FLUSH
10.0000 mL | INTRAVENOUS | Status: DC | PRN
  Administered 2018-07-22: 10 mL
  Filled 2018-07-22: qty 10

## 2018-07-22 NOTE — Telephone Encounter (Signed)
3 cycles already scheduled per 8/21 los.

## 2018-07-22 NOTE — Progress Notes (Signed)
Rebekah Henderson OFFICE PROGRESS NOTE  Redmond School, MD [address] [place] [address]  DIAGNOSIS: Metastatic small cell lung cancer initially diagnosed as Limited stage (T1b, N2, M0) small cell lung cancer presented with left lower lobe/infrahilar mass and large mediastinal lymphadenopathy diagnosed in October 2017.  PRIOR THERAPY: 1) Systemic chemotherapy with cisplatin 60 MG/M2 on day 1 and etoposide 120 MG/M2 on days 1, 2 and 3 status post 1 cycle. This was discontinued secondary to intolerance. 2) Systemic chemotherapy with carboplatin for AUC of 4 on day 1 and etoposide 100 MG/M2 on days 1, 2 and 3 with Neulasta support on day 4 every 3 weeks. Status post 5 cycles. This was concurrent with radiation in [place], [place]. 3) prophylactic cranial irradiation. 4) Systemic chemotherapy with carboplatin for AUC of 5 on day 1, etoposide 100 mg/M2 on days 1, 2 and 3 as well as a Tecentriq (Atezolizumab) 1200 mg IV every 3 weeks with Neulasta support.  First dose February 17, 2018.  Status post 6 cycles of partial response.  CURRENT THERAPY: Maintenance treatment with immunotherapy with Tecentriq 1200 mg IV every 3 weeks.  First dose June 30, 2018.  Status post 1 cycle.  INTERVAL HISTORY: Rebekah Henderson 62 y.o. female returns for routine follow-up visit accompanied by her ex-husband.  The patient is feeling fine today with no specific complaints except for mild fatigue and intermittent nausea.  She uses her antiemetics which are effective.  She has not had any vomiting.  The patient denies fevers and chills.  Denies chest pain, shortness of breath, cough, hemoptysis.  Denies diarrhea and constipation.  The patient is here for evaluation prior to cycle #2 of maintenance treatment with Tecentriq.  MEDICAL HISTORY: Past Medical History:  Diagnosis Date  . 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 Anxiety    takes Prozac daily  . Arthritis   . Benign fundic  gland polyps of stomach   . Colon polyps   . COPD (chronic obstructive pulmonary disease) ([place])   . Dehydration 03/06/2017  . Diverticulitis   . Dyspnea    with exertion  . Encounter for antineoplastic chemotherapy 12/03/2016  . Fibromyalgia   . GERD (gastroesophageal reflux disease)    takes Pantoprazole daily  . Hypertension    takes Metoprolol,Triamterene-HCTZ,and Amlodipine daily  . Hypothyroidism    takes Synthroid daily  . IBS (irritable bowel syndrome)   . lung ca dx'd 10/02/2016   skin, lung  . PONV (postoperative nausea and vomiting)    pt also states that she had some difficulty breathing after cervical fusion    ALLERGIES:  is allergic to penicillins; codeine; fentanyl; ranitidine hcl; keflex [cephalexin]; and lyrica [pregabalin].  MEDICATIONS:  Current Outpatient Medications  Medication Sig Dispense Refill  . albuterol (PROVENTIL HFA;VENTOLIN HFA) 108 (90 Base) MCG/ACT inhaler Inhale 2 puffs into the lungs every 4 (four) hours as needed for wheezing or shortness of breath.    . ALPRAZolam (XANAX) 0.25 MG tablet Take 2 tablets (0.5 mg total) by mouth at bedtime as needed for anxiety. 30 tablet 0  . amLODipine (NORVASC) 5 MG tablet Take 5 mg by mouth daily.      Marland Kitchen dicyclomine (BENTYL) 20 MG tablet Take 20 mg by mouth 3 (three) times daily as needed for spasms.    Marland Kitchen estazolam (PROSOM) 2 MG tablet Take 2 mg by mouth at bedtime.    Marland Kitchen estradiol (ESTRACE) 2 MG tablet Take 2 mg by mouth daily.      Marland Kitchen  FeFum-FePoly-FA-B Cmp-C-Biot (INTEGRA PLUS) CAPS TAKE 1 CAPSULE BY MOUTH ONCE DAILY IN THE MORNING. 30 capsule 0  . FLUoxetine (PROZAC) 40 MG capsule Take 40 mg by mouth daily.   2  . HYDROcodone-acetaminophen (NORCO/VICODIN) 5-325 MG tablet Take 1 tablet by mouth every 4 (four) hours as needed for moderate pain.   0  . levothyroxine (SYNTHROID, LEVOTHROID) 25 MCG tablet 25 mcg.  0  . levothyroxine (SYNTHROID, LEVOTHROID) 50 MCG tablet Take 50 mcg by mouth daily before breakfast.      . lidocaine-prilocaine (EMLA) cream Apply 1 application topically as needed. To numb skin over port a cath: Squeeze a  small amount on cotton ball and place over port site 1-2 hours prior to chemotherapy. 30 g 0  . loperamide (IMODIUM) 2 MG capsule Take 2 mg by mouth every 2 (two) hours as needed for diarrhea or loose stools.    . metoprolol succinate (TOPROL-XL) 25 MG 24 hr tablet Take 25 mg by mouth daily.    . ondansetron (ZOFRAN-ODT) 4 MG disintegrating tablet DISSOLVE 1 TABLET BY MOUTH EVERY 8 HOURS AS NEEDED FOR NAUSEA AND VOMITING. 20 tablet 1  . pantoprazole (PROTONIX) 40 MG tablet Take 40 mg by mouth 2 (two) times daily before a meal.   10  . potassium chloride SA (K-DUR,KLOR-CON) 20 MEQ tablet Take 1 tablet twice a day for 7 days then 1 tablet daily. 30 tablet 0  . promethazine (PHENERGAN) 25 MG tablet Take 1 tablet (25 mg total) by mouth every 6 (six) hours as needed. 30 tablet 10  . senna-docusate (SENOKOT-S) 8.6-50 MG tablet Take 1 tablet by mouth daily as needed.     No current facility-administered medications for this visit.    Facility-Administered Medications Ordered in Other Visits  Medication Dose Route Frequency Provider Last Rate Last Dose  . atezolizumab (TECENTRIQ) 1,200 mg in sodium chloride 0.9 % 250 mL chemo infusion  1,200 mg Intravenous Once Curt Bears, MD      . heparin lock flush 100 unit/mL  500 Units Intracatheter Once PRN Curt Bears, MD      . sodium chloride flush (NS) 0.9 % injection 10 mL  10 mL Intracatheter PRN Curt Bears, MD        SURGICAL HISTORY:  Past Surgical History:  Procedure Laterality Date  . BIOPSY N/A 05/25/2013   Procedure: BIOPSIES (Random Colon; Duodenal; Gastric);  Surgeon: Danie Binder, MD;  Location: [facility];  Service: Endoscopy;  Laterality: N/A;  . BLADDER SUSPENSION    . BREAST ENHANCEMENT SURGERY    . BREAST IMPLANT REMOVAL    . CERVICAL FUSION  AUG 2013  . CHOLECYSTECTOMY  1999  . COLONOSCOPY  2007 [place]    POLYPS  . COLONOSCOPY WITH PROPOFOL N/A 05/25/2013   Procedure: COLONOSCOPY WITH PROPOFOL(at cecum 0957) total withdrawal time=9min);  Surgeon: Danie Binder, MD;  Location: [facility];  Service: Endoscopy;  Laterality: N/A;  . ESOPHAGOGASTRODUODENOSCOPY (EGD) WITH PROPOFOL N/A 05/25/2013   Procedure: ESOPHAGOGASTRODUODENOSCOPY (EGD) WITH PROPOFOL;  Surgeon: Danie Binder, MD;  Location: [facility];  Service: Endoscopy;  Laterality: N/A;  . FOOT SURGERY    . IR FLUORO GUIDE PORT INSERTION RIGHT  03/04/2018  . IR US GUIDE VASC ACCESS RIGHT  03/04/2018  . POLYPECTOMY N/A 05/25/2013   Procedure: POLYPECTOMY (Rectal and Gastric);  Surgeon: Danie Binder, MD;  Location: [facility];  Service: Endoscopy;  Laterality: N/A;  . TONSILLECTOMY    . UPPER GASTROINTESTINAL ENDOSCOPY    .  VIDEO BRONCHOSCOPY WITH ENDOBRONCHIAL ULTRASOUND  09/12/2016   Procedure: VIDEO BRONCHOSCOPY WITH ENDOBRONCHIAL ULTRASOUND AND BIOPSY;  Surgeon: Juanito Doom, MD;  Location: [facility];  Service: Cardiopulmonary;;    REVIEW OF SYSTEMS:   Review of Systems  Constitutional: Negative for appetite change, chills, fever and unexpected weight change.  Positive for mild fatigue. HENT:   Negative for mouth sores, nosebleeds, sore throat and trouble swallowing.   Eyes: Negative for eye problems and icterus.  Respiratory: Negative for cough, hemoptysis, shortness of breath and wheezing.   Cardiovascular: Negative for chest pain and leg swelling.  Gastrointestinal: Negative for abdominal pain, constipation, diarrhea, and vomiting.  Positive for intermittent nausea controlled with antiemetics. Genitourinary: Negative for bladder incontinence, difficulty urinating, dysuria, frequency and hematuria.   Musculoskeletal: Negative for back pain, gait problem, neck pain and neck stiffness.  Skin: Negative for itching and rash.  Neurological: Negative for dizziness, extremity weakness, gait problem, headaches, light-headedness and seizures.   Hematological: Negative for adenopathy. Does not bruise/bleed easily.  Psychiatric/Behavioral: Negative for confusion, depression and sleep disturbance. The patient is not nervous/anxious.     PHYSICAL EXAMINATION:  Blood pressure 125/83, pulse 85, temperature 98.1 F (36.7 C), temperature source Oral, resp. rate 18, height 5\' 4"  (1.626 m), weight 162 lb 9.6 oz (73.8 kg), SpO2 93 %.  ECOG PERFORMANCE STATUS: 1 - Symptomatic but completely ambulatory  Physical Exam  Constitutional: Oriented to person, place, and time and well-developed, well-nourished, and in no distress. No distress.  HENT:  Head: Normocephalic and atraumatic.  Mouth/Throat: Oropharynx is clear and moist. No oropharyngeal exudate.  Eyes: Conjunctivae are normal. Right eye exhibits no discharge. Left eye exhibits no discharge. No scleral icterus.  Neck: Normal range of motion. Neck supple.  Cardiovascular: Normal rate, regular rhythm, normal heart sounds and intact distal pulses.   Pulmonary/Chest: Effort normal and breath sounds normal. No respiratory distress. No wheezes. No rales.  Abdominal: Soft. Bowel sounds are normal. Exhibits no distension and no mass. There is no tenderness.  Musculoskeletal: Normal range of motion. Exhibits no edema.  Lymphadenopathy:    No cervical adenopathy.  Neurological: Alert and oriented to person, place, and time. Exhibits normal muscle tone. Gait normal. Coordination normal.  Skin: Skin is warm and dry. No rash noted. Not diaphoretic. No erythema. No pallor.  Psychiatric: Mood, memory and judgment normal.  Vitals reviewed.  LABORATORY DATA: Lab Results  Component Value Date   WBC 4.4 07/22/2018   HGB 10.1 (L) 07/22/2018   HCT 30.4 (L) 07/22/2018   MCV 97.7 07/22/2018   PLT 191 07/22/2018      Chemistry      Component Value Date/Time   NA 141 07/22/2018 1333   NA 141 09/08/2017 1009   K 3.4 (L) 07/22/2018 1333   K 3.5 09/08/2017 1009   CL 105 07/22/2018 1333   CO2 28  07/22/2018 1333   CO2 29 09/08/2017 1009   BUN 7 (L) 07/22/2018 1333   BUN 5.8 (L) 09/08/2017 1009   CREATININE 0.66 07/22/2018 1333   CREATININE 0.7 09/08/2017 1009      Component Value Date/Time   CALCIUM 9.1 07/22/2018 1333   CALCIUM 9.0 09/08/2017 1009   ALKPHOS 52 07/22/2018 1333   ALKPHOS 49 09/08/2017 1009   AST 10 (L) 07/22/2018 1333   AST 9 09/08/2017 1009   ALT <6 07/22/2018 1333   ALT <6 09/08/2017 1009   BILITOT 0.6 07/22/2018 1333   BILITOT 0.51 09/08/2017 1009  RADIOGRAPHIC STUDIES:  Ct Chest W Contrast  Result Date: 06/29/2018 CLINICAL DATA:  Bilateral small-cell lung cancer with bone metastases and liver metastases. EXAM: CT CHEST, ABDOMEN, AND PELVIS WITH CONTRAST TECHNIQUE: Multidetector CT imaging of the chest, abdomen and pelvis was performed following the standard protocol during bolus administration of intravenous contrast. CONTRAST:  129mL ISOVUE-300 IOPAMIDOL (ISOVUE-300) INJECTION 61% COMPARISON:  04/20/2018 FINDINGS: CT CHEST FINDINGS Cardiovascular: The heart size is normal. No substantial pericardial effusion. Trace pericardial effusion similar to prior. Atherosclerotic calcification is noted in the wall of the thoracic aorta. Right Port-A-Cath tip is positioned in the distal SVC. Mediastinum/Nodes: Abnormal soft tissue attenuation in the AP window has decreased in the interval measuring 1.8 x 2.7 cm today compared to 3.0 x 3.1 cm previously. Treatment related changes in the left hilum are similar to prior. No new mediastinal or hilar lymphadenopathy evident. Small left axillary lymph nodes are stable. Small to moderate hiatal hernia with unremarkable esophagus. Lungs/Pleura: The central tracheobronchial airways are patent. Radiation scarring in the medial left lung with associated architectural distortion is again noted with progressive interstitial and airspace disease in the posterior left upper lobe. Volume loss left hemithorax is similar to prior 6 mm  right lower lobe nodule (4:95) is new in the interval. Ill-defined 4 mm right lower lobe nodule (4:91) also new since prior study. Scarring in the anterior left lower lobe is similar to prior. Musculoskeletal: No worrisome lytic or sclerotic osseous abnormality. CT ABDOMEN PELVIS FINDINGS Hepatobiliary: Segment III lesion identified previously is not evident today. No new focal liver abnormality. Gallbladder surgically absent. Common bile duct similar to prior at 9 mm diameter. Pancreas: Mild, but stable stable prominence of main pancreatic duct in the head of the pancreas. No pancreatic head mass evident. Spleen: No splenomegaly. No focal mass lesion. Adrenals/Urinary Tract: No adrenal nodule or mass. Small cyst upper pole right kidney is stable. Left kidney unremarkable. No evidence for hydroureter. The urinary bladder appears normal for the degree of distention. Stomach/Bowel: Small to moderate hiatal hernia. Stomach otherwise unremarkable. Duodenum is normally positioned as is the ligament of Treitz. Duodenal diverticulum evident. No small bowel wall thickening. No small bowel dilatation. The terminal ileum is normal. The appendix is not visualized, but there is no edema or inflammation in the region of the cecum. Diverticular changes are noted in the left colon without evidence of diverticulitis. Vascular/Lymphatic: There is abdominal aortic atherosclerosis without aneurysm. There is no gastrohepatic or hepatoduodenal ligament lymphadenopathy. No intraperitoneal or retroperitoneal lymphadenopathy. No pelvic sidewall lymphadenopathy. Reproductive: Uterus surgically absent.  There is no adnexal mass. Other: No intraperitoneal free fluid. Musculoskeletal: Stable small sclerotic foci posterior left acetabulum and left femoral head. 18 mm sclerotic lesion in the L3 vertebral body is stable. IMPRESSION: 1. No new or progressive findings on today's exam with continued further decrease in size of recurrent disease in  the AP window. 2. Left hepatic metastatic lesions seen on the previous study is no longer evident. 3. Stable appearance sclerotic focus in the L3 vertebral body. 4.  Aortic Atherosclerois (ICD10-170.0) Electronically Signed   By: Misty Stanley M.D.   On: 06/29/2018 12:06   Ct Abdomen Pelvis W Contrast  Result Date: 06/29/2018 CLINICAL DATA:  Bilateral small-cell lung cancer with bone metastases and liver metastases. EXAM: CT CHEST, ABDOMEN, AND PELVIS WITH CONTRAST TECHNIQUE: Multidetector CT imaging of the chest, abdomen and pelvis was performed following the standard protocol during bolus administration of intravenous contrast. CONTRAST:  184mL ISOVUE-300 IOPAMIDOL (ISOVUE-300) INJECTION  61% COMPARISON:  04/20/2018 FINDINGS: CT CHEST FINDINGS Cardiovascular: The heart size is normal. No substantial pericardial effusion. Trace pericardial effusion similar to prior. Atherosclerotic calcification is noted in the wall of the thoracic aorta. Right Port-A-Cath tip is positioned in the distal SVC. Mediastinum/Nodes: Abnormal soft tissue attenuation in the [facility] window has decreased in the interval measuring 1.8 x 2.7 cm today compared to 3.0 x 3.1 cm previously. Treatment related changes in the left hilum are similar to prior. No new mediastinal or hilar lymphadenopathy evident. Small left axillary lymph nodes are stable. Small to moderate hiatal hernia with unremarkable esophagus. Lungs/Pleura: The central tracheobronchial airways are patent. Radiation scarring in the medial left lung with associated architectural distortion is again noted with progressive interstitial and airspace disease in the posterior left upper lobe. Volume loss left hemithorax is similar to prior 6 mm right lower lobe nodule (4:95) is new in the interval. Ill-defined 4 mm right lower lobe nodule (4:91) also new since prior study. Scarring in the anterior left lower lobe is similar to prior. Musculoskeletal: No worrisome lytic or sclerotic  osseous abnormality. CT ABDOMEN PELVIS FINDINGS Hepatobiliary: Segment III lesion identified previously is not evident today. No new focal liver abnormality. Gallbladder surgically absent. Common bile duct similar to prior at 9 mm diameter. Pancreas: Mild, but stable stable prominence of main pancreatic duct in the head of the pancreas. No pancreatic head mass evident. Spleen: No splenomegaly. No focal mass lesion. Adrenals/Urinary Tract: No adrenal nodule or mass. Small cyst upper pole right kidney is stable. Left kidney unremarkable. No evidence for hydroureter. The urinary bladder appears normal for the degree of distention. Stomach/Bowel: Small to moderate hiatal hernia. Stomach otherwise unremarkable. Duodenum is normally positioned as is the ligament of Treitz. Duodenal diverticulum evident. No small bowel wall thickening. No small bowel dilatation. The terminal ileum is normal. The appendix is not visualized, but there is no edema or inflammation in the region of the cecum. Diverticular changes are noted in the left colon without evidence of diverticulitis. Vascular/Lymphatic: There is abdominal aortic atherosclerosis without aneurysm. There is no gastrohepatic or hepatoduodenal ligament lymphadenopathy. No intraperitoneal or retroperitoneal lymphadenopathy. No pelvic sidewall lymphadenopathy. Reproductive: Uterus surgically absent.  There is no adnexal mass. Other: No intraperitoneal free fluid. Musculoskeletal: Stable small sclerotic foci posterior left acetabulum and left femoral head. 18 mm sclerotic lesion in the L3 vertebral body is stable. IMPRESSION: 1. No new or progressive findings on today's exam with continued further decrease in size of recurrent disease in the AP window. 2. Left hepatic metastatic lesions seen on the previous study is no longer evident. 3. Stable appearance sclerotic focus in the L3 vertebral body. 4.  Aortic Atherosclerois (ICD10-170.0) Electronically Signed   By: Misty Stanley  M.D.   On: 06/29/2018 12:06     ASSESSMENT/PLAN:  Small cell carcinoma of upper lobe of left lung [facility]) This is a very pleasant 62 year old white female was limited stage small cell lung cancer, status post 6 cycles of systemic chemotherapy with carboplatin and etoposide concurrent with radiation and followed by prophylactic cranial irradiation. Patient has been in observation for more than a year.  She has been doing fine but recently started complaining of increasing fatigue and weakness as well as headache. The patient had evidence for disease recurrence and she was a started on treatment with systemic chemotherapy with carboplatin, etoposide and Tecentriq.  She is status post 6 cycles.  She tolerated this treatment well with no concerning complaints except  for fatigue and pancytopenia. The patient is now on maintenance treatment with Tecentriq every 3 weeks.  Status post 1 cycle.  She is tolerating this well overall with the exception of mild fatigue and intermittent nausea. Recommend for the patient to proceed with cycle 2 of Tecentriq today as scheduled.  She will follow-up in 3 weeks for evaluation prior to cycle #3.  For nausea, she will continue Zofran.  The patient was advised to call immediately if she has any concerning symptoms in the interval. The patient voices understanding of current disease status and treatment options and is in agreement with the current care plan. All questions were answered. The patient knows to call the clinic with any problems, questions or concerns. We can certainly see the patient much sooner if necessary.   No orders of the defined types were placed in this encounter.    Mikey Bussing, DNP, AGPCNP-BC, AOCNP 07/22/18

## 2018-07-22 NOTE — Patient Instructions (Signed)
[facility] Discharge Instructions for Patients Receiving Chemotherapy  Today you received the following chemotherapy agents:  Tecentriq To help prevent nausea and vomiting after your treatment, we encourage you to take your nausea medication as prescribed.   If you develop nausea and vomiting that is not controlled by your nausea medication, call the clinic.   BELOW ARE SYMPTOMS THAT SHOULD BE REPORTED IMMEDIATELY:  *FEVER GREATER THAN 100.5 F  *CHILLS WITH OR WITHOUT FEVER  NAUSEA AND VOMITING THAT IS NOT CONTROLLED WITH YOUR NAUSEA MEDICATION  *UNUSUAL SHORTNESS OF BREATH  *UNUSUAL BRUISING OR BLEEDING  TENDERNESS IN MOUTH AND THROAT WITH OR WITHOUT PRESENCE OF ULCERS  *URINARY PROBLEMS  *BOWEL PROBLEMS  UNUSUAL RASH Items with * indicate a potential emergency and should be followed up as soon as possible.  Feel free to call the clinic should you have any questions or concerns. The clinic phone number is [phone].  Please show the [facility] at check-in to the Emergency Department and triage nurse.

## 2018-07-22 NOTE — Assessment & Plan Note (Signed)
This is a very pleasant 62 year old white female was limited stage small cell lung cancer, status post 6 cycles of systemic chemotherapy with carboplatin and etoposide concurrent with radiation and followed by prophylactic cranial irradiation. Patient has been in observation for more than a year.  She has been doing fine but recently started complaining of increasing fatigue and weakness as well as headache. The patient had evidence for disease recurrence and she was a started on treatment with systemic chemotherapy with carboplatin, etoposide and Tecentriq.  She is status post 6 cycles.  She tolerated this treatment well with no concerning complaints except for fatigue and pancytopenia. The patient is now on maintenance treatment with Tecentriq every 3 weeks.  Status post 1 cycle.  She is tolerating this well overall with the exception of mild fatigue and intermittent nausea. Recommend for the patient to proceed with cycle 2 of Tecentriq today as scheduled.  She will follow-up in 3 weeks for evaluation prior to cycle #3.  For nausea, she will continue Zofran.  The patient was advised to call immediately if she has any concerning symptoms in the interval. The patient voices understanding of current disease status and treatment options and is in agreement with the current care plan. All questions were answered. The patient knows to call the clinic with any problems, questions or concerns. We can certainly see the patient much sooner if necessary.

## 2018-07-23 LAB — TSH: TSH: 0.952 u[IU]/mL (ref 0.308–3.960)

## 2018-08-01 DIAGNOSIS — R0902 Hypoxemia: Secondary | ICD-10-CM | POA: Diagnosis not present

## 2018-08-01 DIAGNOSIS — J449 Chronic obstructive pulmonary disease, unspecified: Secondary | ICD-10-CM | POA: Diagnosis not present

## 2018-08-11 ENCOUNTER — Inpatient Hospital Stay: Payer: Medicare Other | Attending: Internal Medicine

## 2018-08-11 ENCOUNTER — Telehealth: Payer: Self-pay | Admitting: Internal Medicine

## 2018-08-11 ENCOUNTER — Inpatient Hospital Stay (HOSPITAL_BASED_OUTPATIENT_CLINIC_OR_DEPARTMENT_OTHER): Payer: Medicare Other | Admitting: Oncology

## 2018-08-11 ENCOUNTER — Encounter: Payer: Self-pay | Admitting: Oncology

## 2018-08-11 ENCOUNTER — Inpatient Hospital Stay: Payer: Medicare Other

## 2018-08-11 VITALS — BP 121/80 | HR 55 | Temp 97.9°F | Resp 18 | Ht 64.0 in | Wt 162.9 lb

## 2018-08-11 DIAGNOSIS — I10 Essential (primary) hypertension: Secondary | ICD-10-CM | POA: Diagnosis not present

## 2018-08-11 DIAGNOSIS — C3412 Malignant neoplasm of upper lobe, left bronchus or lung: Secondary | ICD-10-CM | POA: Diagnosis not present

## 2018-08-11 DIAGNOSIS — R5382 Chronic fatigue, unspecified: Secondary | ICD-10-CM

## 2018-08-11 DIAGNOSIS — R112 Nausea with vomiting, unspecified: Secondary | ICD-10-CM

## 2018-08-11 DIAGNOSIS — E039 Hypothyroidism, unspecified: Secondary | ICD-10-CM | POA: Insufficient documentation

## 2018-08-11 DIAGNOSIS — R110 Nausea: Secondary | ICD-10-CM | POA: Insufficient documentation

## 2018-08-11 DIAGNOSIS — E860 Dehydration: Secondary | ICD-10-CM

## 2018-08-11 DIAGNOSIS — Z79899 Other long term (current) drug therapy: Secondary | ICD-10-CM | POA: Insufficient documentation

## 2018-08-11 DIAGNOSIS — Z5112 Encounter for antineoplastic immunotherapy: Secondary | ICD-10-CM | POA: Insufficient documentation

## 2018-08-11 DIAGNOSIS — R5383 Other fatigue: Secondary | ICD-10-CM | POA: Diagnosis not present

## 2018-08-11 DIAGNOSIS — D61810 Antineoplastic chemotherapy induced pancytopenia: Secondary | ICD-10-CM | POA: Diagnosis not present

## 2018-08-11 DIAGNOSIS — R419 Unspecified symptoms and signs involving cognitive functions and awareness: Secondary | ICD-10-CM

## 2018-08-11 DIAGNOSIS — F419 Anxiety disorder, unspecified: Secondary | ICD-10-CM | POA: Insufficient documentation

## 2018-08-11 DIAGNOSIS — C3492 Malignant neoplasm of unspecified part of left bronchus or lung: Secondary | ICD-10-CM

## 2018-08-11 LAB — CMP (CANCER CENTER ONLY)
ALBUMIN: 3.5 g/dL (ref 3.5–5.0)
ALK PHOS: 52 U/L (ref 38–126)
ANION GAP: 11 (ref 5–15)
AST: 11 U/L — ABNORMAL LOW (ref 15–41)
BUN: 9 mg/dL (ref 8–23)
CALCIUM: 8.6 mg/dL — AB (ref 8.9–10.3)
CO2: 26 mmol/L (ref 22–32)
Chloride: 105 mmol/L (ref 98–111)
Creatinine: 0.75 mg/dL (ref 0.44–1.00)
GFR, Est AFR Am: >60 mL/min (ref >60)
GFR, Estimated: >60 mL/min (ref >60)
GLUCOSE: 89 mg/dL (ref 70–99)
Potassium: 3.7 mmol/L (ref 3.5–5.1)
SODIUM: 142 mmol/L (ref 135–145)
Total Bilirubin: 0.5 mg/dL (ref 0.3–1.2)
Total Protein: 6.3 g/dL — ABNORMAL LOW (ref 6.5–8.1)

## 2018-08-11 LAB — CBC WITH DIFFERENTIAL (CANCER CENTER ONLY)
BASOS PCT: 0 %
Basophils Absolute: 0 10*3/uL (ref 0.0–0.1)
Eosinophils Absolute: 0.2 10*3/uL (ref 0.0–0.5)
Eosinophils Relative: 5 %
HEMATOCRIT: 32.7 % — AB (ref 34.8–46.6)
Hemoglobin: 10.7 g/dL — ABNORMAL LOW (ref 11.6–15.9)
LYMPHS ABS: 1.1 10*3/uL (ref 0.9–3.3)
Lymphocytes Relative: 25 %
MCH: 32.3 pg (ref 25.1–34.0)
MCHC: 32.7 g/dL (ref 31.5–36.0)
MCV: 98.8 fL (ref 79.5–101.0)
MONOS PCT: 9 %
Monocytes Absolute: 0.4 10*3/uL (ref 0.1–0.9)
NEUTROS ABS: 2.7 10*3/uL (ref 1.5–6.5)
Neutrophils Relative %: 61 %
Platelet Count: 153 10*3/uL (ref 145–400)
RBC: 3.31 MIL/uL — ABNORMAL LOW (ref 3.70–5.45)
RDW: 16.5 % — AB (ref 11.2–14.5)
WBC Count: 4.4 10*3/uL (ref 3.9–10.3)

## 2018-08-11 LAB — TSH: TSH: 0.904 u[IU]/mL (ref 0.308–3.960)

## 2018-08-11 MED ORDER — SODIUM CHLORIDE 0.9% FLUSH
10.0000 mL | INTRAVENOUS | Status: DC | PRN
  Administered 2018-08-11: 10 mL
  Filled 2018-08-11: qty 10

## 2018-08-11 MED ORDER — SODIUM CHLORIDE 0.9 % IV SOLN
1200.0000 mg | Freq: Once | INTRAVENOUS | Status: AC
  Administered 2018-08-11: 1200 mg via INTRAVENOUS
  Filled 2018-08-11: qty 20

## 2018-08-11 MED ORDER — HEPARIN SOD (PORK) LOCK FLUSH 100 UNIT/ML IV SOLN
500.0000 [IU] | Freq: Once | INTRAVENOUS | Status: AC | PRN
  Administered 2018-08-11: 500 [IU]
  Filled 2018-08-11: qty 5

## 2018-08-11 MED ORDER — ONDANSETRON HCL 4 MG PO TABS: 4.0000 mg | ORAL_TABLET | Freq: Three times a day (TID) | ORAL | 1 refills | Status: DC | PRN

## 2018-08-11 MED ORDER — SODIUM CHLORIDE 0.9 % IV SOLN
Freq: Once | INTRAVENOUS | Status: AC
  Administered 2018-08-11: 12:00:00 via INTRAVENOUS
  Filled 2018-08-11: qty 250

## 2018-08-11 NOTE — Telephone Encounter (Signed)
Gave pt avs and calendar  °

## 2018-08-11 NOTE — Assessment & Plan Note (Addendum)
This is a very pleasant 62 year old white female was limited stage small cell lung cancer, status post 6 cycles of systemic chemotherapy with carboplatin and etoposide concurrent with radiation and followed by prophylactic cranial irradiation. Patient has been in observation for more than a year. She has been doing fine but recently started complaining of increasing fatigue and weakness as well as headache. The patient had evidence for disease recurrence and she was a started on treatment with systemic chemotherapy with carboplatin, etoposide and Tecentriq. She is status post 6cycles.  She tolerated this treatment well with no concerning complaints except for fatigue and pancytopenia. The patient is now on maintenance treatment with Tecentriq every 3 weeks. Status post 2 cycles.  She is tolerating this well overall with the exception of mild fatigue and intermittent nausea. Recommend for the patient to proceed with cycle 3 of Tecentriq today as scheduled.    She will have a restaging CT scan of the chest, abdomen, pelvis prior to her next visit.  She will follow-up in 3 weeks for evaluation prior to cycle 4 of her treatment and to review her restaging CT scan results.  For nausea, she will continue Zofran.  The patient was advised to call immediately if she has any concerning symptoms in the interval. The patient voices understanding of current disease status and treatment options and is in agreement with the current care plan. All questions were answered. The patient knows to call the clinic with any problems, questions or concerns. We can certainly see the patient much sooner if necessary.

## 2018-08-11 NOTE — Patient Instructions (Signed)
[facility] Discharge Instructions for Patients Receiving Chemotherapy  Today you received the following chemotherapy agents: Tecentriq  To help prevent nausea and vomiting after your treatment, we encourage you to take your nausea medication as directed.   If you develop nausea and vomiting that is not controlled by your nausea medication, call the clinic.   BELOW ARE SYMPTOMS THAT SHOULD BE REPORTED IMMEDIATELY:  *FEVER GREATER THAN 100.5 F  *CHILLS WITH OR WITHOUT FEVER  NAUSEA AND VOMITING THAT IS NOT CONTROLLED WITH YOUR NAUSEA MEDICATION  *UNUSUAL SHORTNESS OF BREATH  *UNUSUAL BRUISING OR BLEEDING  TENDERNESS IN MOUTH AND THROAT WITH OR WITHOUT PRESENCE OF ULCERS  *URINARY PROBLEMS  *BOWEL PROBLEMS  UNUSUAL RASH Items with * indicate a potential emergency and should be followed up as soon as possible.  Feel free to call the clinic should you have any questions or concerns. The clinic phone number is [phone].  Please show the CHEMO ALERT CARD at check-in to the Emergency Department and triage nurse.   

## 2018-08-11 NOTE — Progress Notes (Signed)
[facility] OFFICE PROGRESS NOTE  [address], [place] [address]  DIAGNOSIS:Metastatic small cell lung cancer initially diagnosed as Limited stage (T1b, N2, M0) small cell lung cancer presented with left lower lobe/infrahilar mass and large mediastinal lymphadenopathy diagnosed in October 2017.  PRIOR THERAPY: 1) Systemic chemotherapy with cisplatin 60 MG/M2 on day 1 and etoposide 120 MG/M2 on days 1, 2 and 3 status post 1 cycle. This was discontinued secondary to intolerance. 2) Systemic chemotherapy with carboplatin for AUC of 4 on day 1 and etoposide 100 MG/M2 on days 1, 2 and 3 with Neulasta support on day 4 every 3 weeks. Status post 5 cycles. This was concurrent with radiation in [place], [place]. 3) prophylactic cranial irradiation. 4)Systemic chemotherapy with carboplatin for AUC of 5 on day 1, etoposide 100 mg/M2 on days 1, 2 and 3 as well as a Tecentriq (Atezolizumab) 1200 mg IV every 3 weeks with Neulasta support. First dose February 17, 2018. Status post 6cyclesof partial response.  CURRENT THERAPY:Maintenance treatment with immunotherapy with Tecentriq 1200 mg IV every 3 weeks. First dose June 30, 2018.  Status post 2 cycles.  INTERVAL HISTORY: Rebekah Henderson 62 y.o. female returns for routine follow-up visit by herself.  The patient reports that she is feeling better since her last visit.  She reports that her fatigue has improved.  She also reports that her energy is less frequent.  She uses Zofran on an as-needed basis.  She denies fevers and chills.  Denies chest pain, shortness of breath, cough, hemoptysis.  Denies vomiting, constipation, diarrhea.  Denies recent weight loss or night sweats.  The patient is here for evaluation prior to cycle #3 of her maintenance Tecentriq.  MEDICAL HISTORY: Past Medical History:  Diagnosis Date  . 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 Anxiety    takes Prozac daily  .  Arthritis   . Benign fundic gland polyps of stomach   . Colon polyps   . COPD (chronic obstructive pulmonary disease) ([place])   . Dehydration 03/06/2017  . Diverticulitis   . Dyspnea    with exertion  . Encounter for antineoplastic chemotherapy 12/03/2016  . Fibromyalgia   . GERD (gastroesophageal reflux disease)    takes Pantoprazole daily  . Hypertension    takes Metoprolol,Triamterene-HCTZ,and Amlodipine daily  . Hypothyroidism    takes Synthroid daily  . IBS (irritable bowel syndrome)   . lung ca dx'd 10/02/2016   skin, lung  . PONV (postoperative nausea and vomiting)    pt also states that she had some difficulty breathing after cervical fusion    ALLERGIES:  is allergic to penicillins; codeine; fentanyl; ranitidine hcl; keflex [cephalexin]; and lyrica [pregabalin].  MEDICATIONS:  Current Outpatient Medications  Medication Sig Dispense Refill  . albuterol (PROVENTIL HFA;VENTOLIN HFA) 108 (90 Base) MCG/ACT inhaler Inhale 2 puffs into the lungs every 4 (four) hours as needed for wheezing or shortness of breath.    . ALPRAZolam (XANAX) 0.25 MG tablet Take 2 tablets (0.5 mg total) by mouth at bedtime as needed for anxiety. 30 tablet 0  . amLODipine (NORVASC) 5 MG tablet Take 5 mg by mouth daily.      Marland Kitchen dicyclomine (BENTYL) 20 MG tablet Take 20 mg by mouth 3 (three) times daily as needed for spasms.    Marland Kitchen estazolam (PROSOM) 2 MG tablet Take 2 mg by mouth at bedtime.    Marland Kitchen estradiol (ESTRACE) 2 MG tablet Take 2 mg by mouth daily.      Marland Kitchen  FeFum-FePoly-FA-B Cmp-C-Biot (INTEGRA PLUS) CAPS TAKE 1 CAPSULE BY MOUTH ONCE DAILY IN THE MORNING. 30 capsule 0  . FLUoxetine (PROZAC) 40 MG capsule Take 40 mg by mouth daily.   2  . HYDROcodone-acetaminophen (NORCO/VICODIN) 5-325 MG tablet Take 1 tablet by mouth every 4 (four) hours as needed for moderate pain.   0  . levothyroxine (SYNTHROID, LEVOTHROID) 25 MCG tablet 25 mcg.  0  . levothyroxine (SYNTHROID, LEVOTHROID) 50 MCG tablet Take 50 mcg by  mouth daily before breakfast.     . lidocaine-prilocaine (EMLA) cream Apply 1 application topically as needed. To numb skin over port a cath: Squeeze a  small amount on cotton ball and place over port site 1-2 hours prior to chemotherapy. 30 g 0  . loperamide (IMODIUM) 2 MG capsule Take 2 mg by mouth every 2 (two) hours as needed for diarrhea or loose stools.    . metoprolol succinate (TOPROL-XL) 25 MG 24 hr tablet Take 25 mg by mouth daily.    . ondansetron (ZOFRAN) 4 MG tablet Take 1 tablet (4 mg total) by mouth every 8 (eight) hours as needed for nausea or vomiting. 20 tablet 1  . ondansetron (ZOFRAN-ODT) 4 MG disintegrating tablet DISSOLVE 1 TABLET BY MOUTH EVERY 8 HOURS AS NEEDED FOR NAUSEA AND VOMITING. 20 tablet 1  . pantoprazole (PROTONIX) 40 MG tablet Take 40 mg by mouth 2 (two) times daily before a meal.   10  . potassium chloride SA (K-DUR,KLOR-CON) 20 MEQ tablet Take 1 tablet twice a day for 7 days then 1 tablet daily. 30 tablet 0  . promethazine (PHENERGAN) 25 MG tablet Take 1 tablet (25 mg total) by mouth every 6 (six) hours as needed. 30 tablet 10  . senna-docusate (SENOKOT-S) 8.6-50 MG tablet Take 1 tablet by mouth daily as needed.     No current facility-administered medications for this visit.    Facility-Administered Medications Ordered in Other Visits  Medication Dose Route Frequency Provider Last Rate Last Dose  . sodium chloride flush (NS) 0.9 % injection 10 mL  10 mL Intracatheter PRN Curt Bears, MD   10 mL at 08/11/18 1320    SURGICAL HISTORY:  Past Surgical History:  Procedure Laterality Date  . BIOPSY N/A 05/25/2013   Procedure: BIOPSIES (Random Colon; Duodenal; Gastric);  Surgeon: Danie Binder, MD;  Location: [facility];  Service: Endoscopy;  Laterality: N/A;  . BLADDER SUSPENSION    . BREAST ENHANCEMENT SURGERY    . BREAST IMPLANT REMOVAL    . CERVICAL FUSION  AUG 2013  . CHOLECYSTECTOMY  1999  . COLONOSCOPY  2007 [place]   POLYPS  . COLONOSCOPY WITH  PROPOFOL N/A 05/25/2013   Procedure: COLONOSCOPY WITH PROPOFOL(at cecum 0957) total withdrawal time=64min);  Surgeon: Danie Binder, MD;  Location: [facility];  Service: Endoscopy;  Laterality: N/A;  . ESOPHAGOGASTRODUODENOSCOPY (EGD) WITH PROPOFOL N/A 05/25/2013   Procedure: ESOPHAGOGASTRODUODENOSCOPY (EGD) WITH PROPOFOL;  Surgeon: Danie Binder, MD;  Location: [facility];  Service: Endoscopy;  Laterality: N/A;  . FOOT SURGERY    . IR FLUORO GUIDE PORT INSERTION RIGHT  03/04/2018  . IR US GUIDE VASC ACCESS RIGHT  03/04/2018  . POLYPECTOMY N/A 05/25/2013   Procedure: POLYPECTOMY (Rectal and Gastric);  Surgeon: Danie Binder, MD;  Location: [facility];  Service: Endoscopy;  Laterality: N/A;  . TONSILLECTOMY    . UPPER GASTROINTESTINAL ENDOSCOPY    . VIDEO BRONCHOSCOPY WITH ENDOBRONCHIAL ULTRASOUND  09/12/2016   Procedure: VIDEO BRONCHOSCOPY WITH ENDOBRONCHIAL ULTRASOUND AND  BIOPSY;  Surgeon: Juanito Doom, MD;  Location: [facility];  Service: Cardiopulmonary;;    REVIEW OF SYSTEMS:   Review of Systems  Constitutional: Negative for appetite change, chills, fever and unexpected weight change.  Positive for mild fatigue. HENT:   Negative for mouth sores, nosebleeds, sore throat and trouble swallowing.   Eyes: Negative for eye problems and icterus.  Respiratory: Negative for cough, hemoptysis, shortness of breath and wheezing.   Cardiovascular: Negative for chest pain and leg swelling.  Gastrointestinal: Negative for abdominal pain, constipation, diarrhea, and vomiting.  Positive for intermittent nausea which is improving.  She uses Zofran as needed. Genitourinary: Negative for bladder incontinence, difficulty urinating, dysuria, frequency and hematuria.   Musculoskeletal: Negative for back pain, gait problem, neck pain and neck stiffness.  Skin: Negative for itching and rash.  Neurological: Negative for dizziness, extremity weakness, gait problem, headaches, light-headedness and seizures.  Hematological:  Negative for adenopathy. Does not bruise/bleed easily.  Psychiatric/Behavioral: Negative for confusion, depression and sleep disturbance. The patient is not nervous/anxious.     PHYSICAL EXAMINATION:  Blood pressure 121/80, pulse (!) 55, temperature 97.9 F (36.6 C), temperature source Oral, resp. rate 18, height 5\' 4"  (1.626 m), weight 162 lb 14.4 oz (73.9 kg), SpO2 95 %.  ECOG PERFORMANCE STATUS: 1 - Symptomatic but completely ambulatory  Physical Exam  Constitutional: Oriented to person, place, and time and well-developed, well-nourished, and in no distress. No distress.  HENT:  Head: Normocephalic and atraumatic.  Mouth/Throat: Oropharynx is clear and moist. No oropharyngeal exudate.  Eyes: Conjunctivae are normal. Right eye exhibits no discharge. Left eye exhibits no discharge. No scleral icterus.  Neck: Normal range of motion. Neck supple.  Cardiovascular: Normal rate, regular rhythm, normal heart sounds and intact distal pulses.   Pulmonary/Chest: Effort normal and breath sounds normal. No respiratory distress. No wheezes. No rales.  Abdominal: Soft. Bowel sounds are normal. Exhibits no distension and no mass. There is no tenderness.  Musculoskeletal: Normal range of motion. Exhibits no edema.  Lymphadenopathy:    No cervical adenopathy.  Neurological: Alert and oriented to person, place, and time. Exhibits normal muscle tone. Gait normal. Coordination normal.  Skin: Skin is warm and dry. No rash noted. Not diaphoretic. No erythema. No pallor.  Psychiatric: Mood, memory and judgment normal.  Vitals reviewed.  LABORATORY DATA: Lab Results  Component Value Date   WBC 4.4 08/11/2018   HGB 10.7 (L) 08/11/2018   HCT 32.7 (L) 08/11/2018   MCV 98.8 08/11/2018   PLT 153 08/11/2018      Chemistry      Component Value Date/Time   NA 142 08/11/2018 1023   NA 141 09/08/2017 1009   K 3.7 08/11/2018 1023   K 3.5 09/08/2017 1009   CL 105 08/11/2018 1023   CO2 26 08/11/2018 1023    CO2 29 09/08/2017 1009   BUN 9 08/11/2018 1023   BUN 5.8 (L) 09/08/2017 1009   CREATININE 0.75 08/11/2018 1023   CREATININE 0.7 09/08/2017 1009      Component Value Date/Time   CALCIUM 8.6 (L) 08/11/2018 1023   CALCIUM 9.0 09/08/2017 1009   ALKPHOS 52 08/11/2018 1023   ALKPHOS 49 09/08/2017 1009   AST 11 (L) 08/11/2018 1023   AST 9 09/08/2017 1009   ALT <6 08/11/2018 1023   ALT <6 09/08/2017 1009   BILITOT 0.5 08/11/2018 1023   BILITOT 0.51 09/08/2017 1009       RADIOGRAPHIC STUDIES:  No results found.   ASSESSMENT/PLAN:  Small cell carcinoma of upper lobe of left lung [facility]) This is a very pleasant 62 year old white female was limited stage small cell lung cancer, status post 6 cycles of systemic chemotherapy with carboplatin and etoposide concurrent with radiation and followed by prophylactic cranial irradiation. Patient has been in observation for more than a year. She has been doing fine but recently started complaining of increasing fatigue and weakness as well as headache. The patient had evidence for disease recurrence and she was a started on treatment with systemic chemotherapy with carboplatin, etoposide and Tecentriq. She is status post 6cycles.  She tolerated this treatment well with no concerning complaints except for fatigue and pancytopenia. The patient is now on maintenance treatment with Tecentriq every 3 weeks. Status post 2 cycles.  She is tolerating this well overall with the exception of mild fatigue and intermittent nausea. Recommend for the patient to proceed with cycle 3 of Tecentriq today as scheduled.    She will have a restaging CT scan of the chest, abdomen, pelvis prior to her next visit.  She will follow-up in 3 weeks for evaluation prior to cycle 4 of her treatment and to review her restaging CT scan results.  For nausea, she will continue Zofran.  The patient was advised to call immediately if she has any concerning symptoms in the  interval. The patient voices understanding of current disease status and treatment options and is in agreement with the current care plan. All questions were answered. The patient knows to call the clinic with any problems, questions or concerns. We can certainly see the patient much sooner if necessary.   Orders Placed This Encounter  Procedures  . CT ABDOMEN PELVIS W CONTRAST    Standing Status:   Future    Standing Expiration Date:   08/12/2019    Order Specific Question:   If indicated for the ordered procedure, I authorize the administration of contrast media per Radiology protocol    Answer:   Yes    Order Specific Question:   Preferred imaging location?    Answer:   [facility]    Order Specific Question:   Radiology Contrast Protocol - do NOT remove file path    Answer:   \\[email or web address]    Order Specific Question:   ** REASON FOR EXAM (FREE TEXT)    Answer:   Lung cancer. Restaging.  . CT CHEST W CONTRAST    Standing Status:   Future    Standing Expiration Date:   08/12/2019    Order Specific Question:   If indicated for the ordered procedure, I authorize the administration of contrast media per Radiology protocol    Answer:   Yes    Order Specific Question:   Preferred imaging location?    Answer:   [facility]    Order Specific Question:   Radiology Contrast Protocol - do NOT remove file path    Answer:   \\[email or web address]    Order Specific Question:   ** REASON FOR EXAM (FREE TEXT)    Answer:   Lung cancer. Restaging.     Mikey Bussing, DNP, AGPCNP-BC, AOCNP 08/11/18

## 2018-08-14 ENCOUNTER — Other Ambulatory Visit: Payer: Self-pay | Admitting: Internal Medicine

## 2018-08-26 ENCOUNTER — Ambulatory Visit (HOSPITAL_COMMUNITY)
Admission: RE | Admit: 2018-08-26 | Discharge: 2018-08-26 | Disposition: A | Discharge status: Home / Self Care | Payer: Medicare Other | Source: Ambulatory Visit | Attending: Oncology | Admitting: Oncology

## 2018-08-26 DIAGNOSIS — R918 Other nonspecific abnormal finding of lung field: Secondary | ICD-10-CM | POA: Diagnosis not present

## 2018-08-26 DIAGNOSIS — J8410 Pulmonary fibrosis, unspecified: Secondary | ICD-10-CM | POA: Insufficient documentation

## 2018-08-26 DIAGNOSIS — I700 Atherosclerosis of aorta: Secondary | ICD-10-CM | POA: Insufficient documentation

## 2018-08-26 DIAGNOSIS — M899 Disorder of bone, unspecified: Secondary | ICD-10-CM | POA: Insufficient documentation

## 2018-08-26 DIAGNOSIS — C3412 Malignant neoplasm of upper lobe, left bronchus or lung: Secondary | ICD-10-CM | POA: Diagnosis not present

## 2018-08-26 MED ORDER — HEPARIN SOD (PORK) LOCK FLUSH 100 UNIT/ML IV SOLN
INTRAVENOUS | Status: AC
  Administered 2018-08-26: 500 [IU]
  Filled 2018-08-26: qty 5

## 2018-08-26 MED ORDER — HEPARIN SOD (PORK) LOCK FLUSH 100 UNIT/ML IV SOLN
500.0000 [IU] | Freq: Once | INTRAVENOUS | Status: AC
  Administered 2018-08-26: 500 [IU]

## 2018-08-26 MED ORDER — IOHEXOL 300 MG/ML  SOLN
100.0000 mL | Freq: Once | INTRAMUSCULAR | Status: AC | PRN
  Administered 2018-08-26: 100 mL via INTRAVENOUS

## 2018-08-31 DIAGNOSIS — J449 Chronic obstructive pulmonary disease, unspecified: Secondary | ICD-10-CM | POA: Diagnosis not present

## 2018-08-31 DIAGNOSIS — R0902 Hypoxemia: Secondary | ICD-10-CM | POA: Diagnosis not present

## 2018-09-01 ENCOUNTER — Inpatient Hospital Stay: Payer: Medicare Other

## 2018-09-01 ENCOUNTER — Encounter: Payer: Self-pay | Admitting: Internal Medicine

## 2018-09-01 ENCOUNTER — Telehealth: Payer: Self-pay | Admitting: Internal Medicine

## 2018-09-01 ENCOUNTER — Inpatient Hospital Stay: Payer: Medicare Other | Attending: Internal Medicine

## 2018-09-01 ENCOUNTER — Inpatient Hospital Stay (HOSPITAL_BASED_OUTPATIENT_CLINIC_OR_DEPARTMENT_OTHER): Payer: Medicare Other | Admitting: Internal Medicine

## 2018-09-01 VITALS — BP 136/78 | HR 60 | Temp 97.5°F | Resp 17 | Ht 64.0 in | Wt 165.0 lb

## 2018-09-01 DIAGNOSIS — C787 Secondary malignant neoplasm of liver and intrahepatic bile duct: Secondary | ICD-10-CM

## 2018-09-01 DIAGNOSIS — C7951 Secondary malignant neoplasm of bone: Secondary | ICD-10-CM | POA: Diagnosis not present

## 2018-09-01 DIAGNOSIS — Z5112 Encounter for antineoplastic immunotherapy: Secondary | ICD-10-CM | POA: Insufficient documentation

## 2018-09-01 DIAGNOSIS — C3492 Malignant neoplasm of unspecified part of left bronchus or lung: Secondary | ICD-10-CM

## 2018-09-01 DIAGNOSIS — Z79899 Other long term (current) drug therapy: Secondary | ICD-10-CM

## 2018-09-01 DIAGNOSIS — C3412 Malignant neoplasm of upper lobe, left bronchus or lung: Secondary | ICD-10-CM | POA: Diagnosis not present

## 2018-09-01 DIAGNOSIS — E039 Hypothyroidism, unspecified: Secondary | ICD-10-CM | POA: Diagnosis not present

## 2018-09-01 DIAGNOSIS — E860 Dehydration: Secondary | ICD-10-CM

## 2018-09-01 DIAGNOSIS — I10 Essential (primary) hypertension: Secondary | ICD-10-CM | POA: Insufficient documentation

## 2018-09-01 DIAGNOSIS — J449 Chronic obstructive pulmonary disease, unspecified: Secondary | ICD-10-CM

## 2018-09-01 DIAGNOSIS — I700 Atherosclerosis of aorta: Secondary | ICD-10-CM | POA: Diagnosis not present

## 2018-09-01 DIAGNOSIS — F419 Anxiety disorder, unspecified: Secondary | ICD-10-CM | POA: Diagnosis not present

## 2018-09-01 DIAGNOSIS — R112 Nausea with vomiting, unspecified: Secondary | ICD-10-CM

## 2018-09-01 LAB — CBC WITH DIFFERENTIAL (CANCER CENTER ONLY)
BASOS ABS: 0 10*3/uL (ref 0.0–0.1)
Basophils Relative: 0 %
EOS ABS: 0.2 10*3/uL (ref 0.0–0.5)
EOS PCT: 5 %
HCT: 34.1 % — ABNORMAL LOW (ref 34.8–46.6)
Hemoglobin: 11.7 g/dL (ref 11.6–15.9)
LYMPHS ABS: 1.1 10*3/uL (ref 0.9–3.3)
Lymphocytes Relative: 23 %
MCH: 32.6 pg (ref 25.1–34.0)
MCHC: 34.2 g/dL (ref 31.5–36.0)
MCV: 95.3 fL (ref 79.5–101.0)
Monocytes Absolute: 0.4 10*3/uL (ref 0.1–0.9)
Monocytes Relative: 9 %
NEUTROS PCT: 63 %
Neutro Abs: 3.1 10*3/uL (ref 1.5–6.5)
PLATELETS: 168 10*3/uL (ref 145–400)
RBC: 3.58 MIL/uL — AB (ref 3.70–5.45)
RDW: 17 % — ABNORMAL HIGH (ref 11.2–14.5)
WBC: 4.8 10*3/uL (ref 3.9–10.3)

## 2018-09-01 LAB — CMP (CANCER CENTER ONLY)
AST: 10 U/L — ABNORMAL LOW (ref 15–41)
Albumin: 3.4 g/dL — ABNORMAL LOW (ref 3.5–5.0)
Alkaline Phosphatase: 51 U/L (ref 38–126)
Anion gap: 10 (ref 5–15)
BUN: 7 mg/dL — AB (ref 8–23)
CHLORIDE: 104 mmol/L (ref 98–111)
CO2: 28 mmol/L (ref 22–32)
CREATININE: 0.69 mg/dL (ref 0.44–1.00)
Calcium: 9 mg/dL (ref 8.9–10.3)
Glucose, Bld: 89 mg/dL (ref 70–99)
Potassium: 3.5 mmol/L (ref 3.5–5.1)
Sodium: 142 mmol/L (ref 135–145)
Total Bilirubin: 0.6 mg/dL (ref 0.3–1.2)
Total Protein: 6.5 g/dL (ref 6.5–8.1)

## 2018-09-01 MED ORDER — HEPARIN SOD (PORK) LOCK FLUSH 100 UNIT/ML IV SOLN
500.0000 [IU] | Freq: Once | INTRAVENOUS | Status: AC | PRN
  Administered 2018-09-01: 500 [IU]
  Filled 2018-09-01: qty 5

## 2018-09-01 MED ORDER — SODIUM CHLORIDE 0.9 % IV SOLN
1200.0000 mg | Freq: Once | INTRAVENOUS | Status: AC
  Administered 2018-09-01: 1200 mg via INTRAVENOUS
  Filled 2018-09-01: qty 20

## 2018-09-01 MED ORDER — SODIUM CHLORIDE 0.9 % IV SOLN
Freq: Once | INTRAVENOUS | Status: AC
  Administered 2018-09-01: 12:00:00 via INTRAVENOUS
  Filled 2018-09-01: qty 250

## 2018-09-01 MED ORDER — SODIUM CHLORIDE 0.9% FLUSH
10.0000 mL | INTRAVENOUS | Status: DC | PRN
  Administered 2018-09-01: 10 mL
  Filled 2018-09-01: qty 10

## 2018-09-01 NOTE — Patient Instructions (Signed)
[facility] Discharge Instructions for Patients Receiving Chemotherapy  Today you received the following chemotherapy agent: Tecentriq  To help prevent nausea and vomiting after your treatment, we encourage you to take your nausea medication as directed.    If you develop nausea and vomiting that is not controlled by your nausea medication, call the clinic.   BELOW ARE SYMPTOMS THAT SHOULD BE REPORTED IMMEDIATELY:  *FEVER GREATER THAN 100.5 F  *CHILLS WITH OR WITHOUT FEVER  NAUSEA AND VOMITING THAT IS NOT CONTROLLED WITH YOUR NAUSEA MEDICATION  *UNUSUAL SHORTNESS OF BREATH  *UNUSUAL BRUISING OR BLEEDING  TENDERNESS IN MOUTH AND THROAT WITH OR WITHOUT PRESENCE OF ULCERS  *URINARY PROBLEMS  *BOWEL PROBLEMS  UNUSUAL RASH Items with * indicate a potential emergency and should be followed up as soon as possible.  Feel free to call the clinic should you have any questions or concerns. The clinic phone number is [phone].  Please show the [place] at check-in to the Emergency Department and triage nurse.

## 2018-09-01 NOTE — Progress Notes (Signed)
[facility] [phone]   [phone]) [phone]  OFFICE PROGRESS NOTE  [place] School, MD [address] [place] [address]  DIAGNOSIS: Metastatic small cell lung cancer initially diagnosed as Limited stage (T1b, N2, M0) small cell lung cancer presented with left lower lobe/infrahilar mass and large mediastinal lymphadenopathy diagnosed in October 2017.  PRIOR THERAPY: 1) Systemic chemotherapy with cisplatin 60 MG/M2 on day 1 and etoposide 120 MG/M2 on days 1, 2 and 3 status post 1 cycle. This was discontinued secondary to intolerance. 2) Systemic chemotherapy with carboplatin for AUC of 4 on day 1 and etoposide 100 MG/M2 on days 1, 2 and 3 with Neulasta support on day 4 every 3 weeks. Status post 5 cycles. This was concurrent with radiation in [place], [place]. 3) prophylactic cranial irradiation. 4) Systemic chemotherapy with carboplatin for AUC of 5 on day 1, etoposide 100 mg/M2 on days 1, 2 and 3 as well as a Tecentriq (Atezolizumab) 1200 mg IV every 3 weeks with Neulasta support.  First dose February 17, 2018.  Status post 6 cycles of partial response.   CURRENT THERAPY: Maintenance treatment with immunotherapy with Tecentriq 1200 mg IV every 3 weeks.  First dose June 30, 2018.  Status post 3 cycles.  INTERVAL HISTORY: Rebekah Henderson 62 y.o. female returns to the clinic today for follow-up visit.  The patient is feeling fine today with no concerning complaints except for mild fatigue.  She continues to tolerate her maintenance treatment with Tecentriq Huey Bienenstock) fairly well.  She denied having any significant skin rash or diarrhea.  She has no nausea, vomiting, diarrhea or constipation.  She denied having any chest pain, shortness of breath, cough or hemoptysis.  She has no recent weight loss or night sweats.  She had repeat CT scan of the chest, abdomen and pelvis performed recently and she is here for evaluation and discussion of her risk her  results.  MEDICAL HISTORY: Past Medical History:  Diagnosis Date  . 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 Anxiety    takes Prozac daily  . Arthritis   . Benign fundic gland polyps of stomach   . Colon polyps   . COPD (chronic obstructive pulmonary disease) ([place])   . Dehydration 03/06/2017  . Diverticulitis   . Dyspnea    with exertion  . Encounter for antineoplastic chemotherapy 12/03/2016  . Fibromyalgia   . GERD (gastroesophageal reflux disease)    takes Pantoprazole daily  . Hypertension    takes Metoprolol,Triamterene-HCTZ,and Amlodipine daily  . Hypothyroidism    takes Synthroid daily  . IBS (irritable bowel syndrome)   . lung ca dx'd 10/02/2016   skin, lung  . PONV (postoperative nausea and vomiting)    pt also states that she had some difficulty breathing after cervical fusion    ALLERGIES:  is allergic to penicillins; codeine; fentanyl; ranitidine hcl; keflex [cephalexin]; and lyrica [pregabalin].  MEDICATIONS:  Current Outpatient Medications  Medication Sig Dispense Refill  . albuterol (PROVENTIL HFA;VENTOLIN HFA) 108 (90 Base) MCG/ACT inhaler Inhale 2 puffs into the lungs every 4 (four) hours as needed for wheezing or shortness of breath.    . ALPRAZolam (XANAX) 0.25 MG tablet Take 2 tablets (0.5 mg total) by mouth at bedtime as needed for anxiety. 30 tablet 0  . amLODipine (NORVASC) 5 MG tablet Take 5 mg by mouth daily.      Marland Kitchen dicyclomine (BENTYL) 20 MG tablet Take 20 mg by mouth 3 (three) times daily  as needed for spasms.    Marland Kitchen estazolam (PROSOM) 2 MG tablet Take 2 mg by mouth at bedtime.    Marland Kitchen estradiol (ESTRACE) 2 MG tablet Take 2 mg by mouth daily.      Marland Kitchen FeFum-FePoly-FA-B Cmp-C-Biot (INTEGRA PLUS) CAPS TAKE 1 CAPSULE BY MOUTH ONCE DAILY IN THE MORNING. 30 capsule 0  . FLUoxetine (PROZAC) 40 MG capsule Take 40 mg by mouth daily.   2  . HYDROcodone-acetaminophen (NORCO/VICODIN) 5-325 MG tablet Take 1 tablet by mouth every 4 (four) hours as needed  for moderate pain.   0  . levothyroxine (SYNTHROID, LEVOTHROID) 25 MCG tablet 25 mcg.  0  . levothyroxine (SYNTHROID, LEVOTHROID) 50 MCG tablet Take 50 mcg by mouth daily before breakfast.     . lidocaine-prilocaine (EMLA) cream Apply 1 application topically as needed. To numb skin over port a cath: Squeeze a  small amount on cotton ball and place over port site 1-2 hours prior to chemotherapy. 30 g 0  . loperamide (IMODIUM) 2 MG capsule Take 2 mg by mouth every 2 (two) hours as needed for diarrhea or loose stools.    . metoprolol succinate (TOPROL-XL) 25 MG 24 hr tablet Take 25 mg by mouth daily.    . ondansetron (ZOFRAN) 4 MG tablet Take 1 tablet (4 mg total) by mouth every 8 (eight) hours as needed for nausea or vomiting. 20 tablet 1  . ondansetron (ZOFRAN-ODT) 4 MG disintegrating tablet DISSOLVE 1 TABLET BY MOUTH EVERY 8 HOURS AS NEEDED FOR NAUSEA AND VOMITING. 20 tablet 1  . pantoprazole (PROTONIX) 40 MG tablet Take 40 mg by mouth 2 (two) times daily before a meal.   10  . potassium chloride SA (K-DUR,KLOR-CON) 20 MEQ tablet Take 1 tablet twice a day for 7 days then 1 tablet daily. 30 tablet 0  . potassium chloride SA (K-DUR,KLOR-CON) 20 MEQ tablet TAKE 1 TABLET BY MOUTH TWICE DAILY FOR 7 DAYS THEN 1 TABLET DAILY. 30 tablet 0  . promethazine (PHENERGAN) 25 MG tablet Take 1 tablet (25 mg total) by mouth every 6 (six) hours as needed. 30 tablet 10  . senna-docusate (SENOKOT-S) 8.6-50 MG tablet Take 1 tablet by mouth daily as needed.     No current facility-administered medications for this visit.    Facility-Administered Medications Ordered in Other Visits  Medication Dose Route Frequency Provider Last Rate Last Dose  . sodium chloride flush (NS) 0.9 % injection 10 mL  10 mL Intracatheter PRN Curt Bears, MD   10 mL at 09/01/18 1020    SURGICAL HISTORY:  Past Surgical History:  Procedure Laterality Date  . BIOPSY N/A 05/25/2013   Procedure: BIOPSIES (Random Colon; Duodenal; Gastric);   Surgeon: Danie Binder, MD;  Location: [facility];  Service: Endoscopy;  Laterality: N/A;  . BLADDER SUSPENSION    . BREAST ENHANCEMENT SURGERY    . BREAST IMPLANT REMOVAL    . CERVICAL FUSION  AUG 2013  . CHOLECYSTECTOMY  1999  . COLONOSCOPY  2007 [place]   POLYPS  . COLONOSCOPY WITH PROPOFOL N/A 05/25/2013   Procedure: COLONOSCOPY WITH PROPOFOL(at cecum 0957) total withdrawal time=57min);  Surgeon: Danie Binder, MD;  Location: [facility];  Service: Endoscopy;  Laterality: N/A;  . ESOPHAGOGASTRODUODENOSCOPY (EGD) WITH PROPOFOL N/A 05/25/2013   Procedure: ESOPHAGOGASTRODUODENOSCOPY (EGD) WITH PROPOFOL;  Surgeon: Danie Binder, MD;  Location: [facility];  Service: Endoscopy;  Laterality: N/A;  . FOOT SURGERY    . IR FLUORO GUIDE PORT INSERTION RIGHT  03/04/2018  .  IR US GUIDE VASC ACCESS RIGHT  03/04/2018  . POLYPECTOMY N/A 05/25/2013   Procedure: POLYPECTOMY (Rectal and Gastric);  Surgeon: Danie Binder, MD;  Location: [facility];  Service: Endoscopy;  Laterality: N/A;  . TONSILLECTOMY    . UPPER GASTROINTESTINAL ENDOSCOPY    . VIDEO BRONCHOSCOPY WITH ENDOBRONCHIAL ULTRASOUND  09/12/2016   Procedure: VIDEO BRONCHOSCOPY WITH ENDOBRONCHIAL ULTRASOUND AND BIOPSY;  Surgeon: Juanito Doom, MD;  Location: [facility];  Service: Cardiopulmonary;;    REVIEW OF SYSTEMS:  Constitutional: positive for fatigue Eyes: negative Ears, nose, mouth, throat, and face: negative Respiratory: positive for dyspnea on exertion Cardiovascular: negative Gastrointestinal: negative Genitourinary:negative Integument/breast: negative Hematologic/lymphatic: negative Musculoskeletal:negative Neurological: negative Behavioral/Psych: negative Endocrine: negative Allergic/Immunologic: negative   PHYSICAL EXAMINATION: General appearance: alert, cooperative, fatigued and no distress Head: Normocephalic, without obvious abnormality, atraumatic Neck: no adenopathy, no JVD, supple, symmetrical, trachea midline and thyroid not enlarged,  symmetric, no tenderness/mass/nodules Lymph nodes: Cervical, supraclavicular, and axillary nodes normal. Resp: clear to auscultation bilaterally Back: symmetric, no curvature. ROM normal. No CVA tenderness. Cardio: regular rate and rhythm, S1, S2 normal, no murmur, click, rub or gallop GI: soft, non-tender; bowel sounds normal; no masses,  no organomegaly Extremities: extremities normal, atraumatic, no cyanosis or edema Neurologic: Alert and oriented X 3, normal strength and tone. Normal symmetric reflexes. Normal coordination and gait   ECOG PERFORMANCE STATUS: 1 - Symptomatic but completely ambulatory  Blood pressure 136/78, pulse 60, temperature (!) 97.5 F (36.4 C), temperature source Oral, resp. rate 17, height 5\' 4"  (1.626 m), weight 165 lb (74.8 kg), SpO2 93 %.  LABORATORY DATA: Lab Results  Component Value Date   WBC 4.8 09/01/2018   HGB 11.7 09/01/2018   HCT 34.1 (L) 09/01/2018   MCV 95.3 09/01/2018   PLT 168 09/01/2018      Chemistry      Component Value Date/Time   NA 142 08/11/2018 1023   NA 141 09/08/2017 1009   K 3.7 08/11/2018 1023   K 3.5 09/08/2017 1009   CL 105 08/11/2018 1023   CO2 26 08/11/2018 1023   CO2 29 09/08/2017 1009   BUN 9 08/11/2018 1023   BUN 5.8 (L) 09/08/2017 1009   CREATININE 0.75 08/11/2018 1023   CREATININE 0.7 09/08/2017 1009      Component Value Date/Time   CALCIUM 8.6 (L) 08/11/2018 1023   CALCIUM 9.0 09/08/2017 1009   ALKPHOS 52 08/11/2018 1023   ALKPHOS 49 09/08/2017 1009   AST 11 (L) 08/11/2018 1023   AST 9 09/08/2017 1009   ALT <6 08/11/2018 1023   ALT <6 09/08/2017 1009   BILITOT 0.5 08/11/2018 1023   BILITOT 0.51 09/08/2017 1009       RADIOGRAPHIC STUDIES: Ct Chest W Contrast  Result Date: 08/26/2018 CLINICAL DATA:  Left upper lobe small cell lung cancer EXAM: CT CHEST, ABDOMEN, AND PELVIS WITH CONTRAST TECHNIQUE: Multidetector CT imaging of the chest, abdomen and pelvis was performed following the standard protocol  during bolus administration of intravenous contrast. CONTRAST:  121mL OMNIPAQUE IOHEXOL 300 MG/ML  SOLN COMPARISON:  06/29/2018 FINDINGS: CT CHEST FINDINGS Cardiovascular: Normal heart size. Mild pericardial thickening appears similar to previous exam. Aortic atherosclerosis. Mediastinum/Nodes: Normal appearance of the thyroid gland. The trachea appears patent and is midline. Moderate hiatal hernia. No supraclavicular or axillary adenopathy. Soft tissue within the left paratracheal region is again noted measuring 2.5 x 2.2 cm, image 23/2. Not significantly changed from 06/29/2018. No hilar or mediastinal adenopathy identified. Lungs/Pleura: No pleural effusion.  Fibrosis and masslike architectural distortion within the perihilar and paramediastinal left lung is identified reflecting changes of external beam radiation. The appearance is stable from previous exam. New multifocal bilateral lower lobe patchy airspace and ground-glass densities are noted involving both lower lobes. This is favored to represent changes secondary to inflammation or infection. Musculoskeletal: No chest wall mass or suspicious bone lesions identified. CT ABDOMEN PELVIS FINDINGS Hepatobiliary: No focal liver abnormality. Previous cholecystectomy. Unchanged increase caliber of the CBD measuring 1 cm. Pancreas: Unremarkable. No pancreatic ductal dilatation or surrounding inflammatory changes. Spleen: Normal in size without focal abnormality. Adrenals/Urinary Tract: Normal adrenal glands. The kidneys are unremarkable. No mass or hydronephrosis. Small cysts noted within the upper pole of right kidney. Urinary bladder appears normal. Stomach/Bowel: Hiatal hernia noted. The small bowel loops have a normal course and caliber without evidence for bowel obstruction. Numerous colonic diverticula noted without acute inflammation. Vascular/Lymphatic: Aortic atherosclerosis. No aneurysm. No abdominopelvic adenopathy. Reproductive: Status post hysterectomy.  No adnexal masses. Other: No abdominal wall hernia or abnormality. No abdominopelvic ascites. Musculoskeletal: Sclerotic lesion involving the L3 vertebra appears unchanged measuring 2 cm, image 103/5. Stable sclerotic lesion within the left acetabulum and left femoral head. IMPRESSION: 1. Stable appearance of abnormal soft tissue compatible with recurrent disease within the [facility] window. Paramediastinal fibrosis and masslike architectural distortion in the left lung is also unchanged compatible with external beam radiation. No new sites of disease identified. 2. There are new multifocal patchy ground-glass and airspace opacities most compatible with a inflammatory or infectious process. Pulmonary toxicity secondary to immunotherapy is not excluded. Clinical correlation advised. 3. Stable sclerotic lesion within the L3 vertebra. 4.  Aortic Atherosclerosis (ICD10-I70.0). Electronically Signed   By: Kerby Moors M.D.   On: 08/26/2018 14:53   Ct Abdomen Pelvis W Contrast  Result Date: 08/26/2018 CLINICAL DATA:  Left upper lobe small cell lung cancer EXAM: CT CHEST, ABDOMEN, AND PELVIS WITH CONTRAST TECHNIQUE: Multidetector CT imaging of the chest, abdomen and pelvis was performed following the standard protocol during bolus administration of intravenous contrast. CONTRAST:  168mL OMNIPAQUE IOHEXOL 300 MG/ML  SOLN COMPARISON:  06/29/2018 FINDINGS: CT CHEST FINDINGS Cardiovascular: Normal heart size. Mild pericardial thickening appears similar to previous exam. Aortic atherosclerosis. Mediastinum/Nodes: Normal appearance of the thyroid gland. The trachea appears patent and is midline. Moderate hiatal hernia. No supraclavicular or axillary adenopathy. Soft tissue within the left paratracheal region is again noted measuring 2.5 x 2.2 cm, image 23/2. Not significantly changed from 06/29/2018. No hilar or mediastinal adenopathy identified. Lungs/Pleura: No pleural effusion. Fibrosis and masslike architectural distortion  within the perihilar and paramediastinal left lung is identified reflecting changes of external beam radiation. The appearance is stable from previous exam. New multifocal bilateral lower lobe patchy airspace and ground-glass densities are noted involving both lower lobes. This is favored to represent changes secondary to inflammation or infection. Musculoskeletal: No chest wall mass or suspicious bone lesions identified. CT ABDOMEN PELVIS FINDINGS Hepatobiliary: No focal liver abnormality. Previous cholecystectomy. Unchanged increase caliber of the CBD measuring 1 cm. Pancreas: Unremarkable. No pancreatic ductal dilatation or surrounding inflammatory changes. Spleen: Normal in size without focal abnormality. Adrenals/Urinary Tract: Normal adrenal glands. The kidneys are unremarkable. No mass or hydronephrosis. Small cysts noted within the upper pole of right kidney. Urinary bladder appears normal. Stomach/Bowel: Hiatal hernia noted. The small bowel loops have a normal course and caliber without evidence for bowel obstruction. Numerous colonic diverticula noted without acute inflammation. Vascular/Lymphatic: Aortic atherosclerosis. No aneurysm. No abdominopelvic adenopathy. Reproductive:  Status post hysterectomy. No adnexal masses. Other: No abdominal wall hernia or abnormality. No abdominopelvic ascites. Musculoskeletal: Sclerotic lesion involving the L3 vertebra appears unchanged measuring 2 cm, image 103/5. Stable sclerotic lesion within the left acetabulum and left femoral head. IMPRESSION: 1. Stable appearance of abnormal soft tissue compatible with recurrent disease within the [facility] window. Paramediastinal fibrosis and masslike architectural distortion in the left lung is also unchanged compatible with external beam radiation. No new sites of disease identified. 2. There are new multifocal patchy ground-glass and airspace opacities most compatible with a inflammatory or infectious process. Pulmonary toxicity  secondary to immunotherapy is not excluded. Clinical correlation advised. 3. Stable sclerotic lesion within the L3 vertebra. 4.  Aortic Atherosclerosis (ICD10-I70.0). Electronically Signed   By: Kerby Moors M.D.   On: 08/26/2018 14:53    ASSESSMENT AND PLAN:  This is a very pleasant 62 years old white female was limited stage small cell lung cancer, status post 6 cycles of systemic chemotherapy with carboplatin and etoposide concurrent with radiation and followed by prophylactic cranial irradiation. Patient has been in observation for more than a year.  She has been doing fine but recently started complaining of increasing fatigue and weakness as well as headache. The patient had evidence for disease recurrence and she was a started on treatment with systemic chemotherapy with carboplatin, etoposide and Tecentriq.  She is status post 6 cycles.  The patient had no evidence for disease progression after the induction phase of her treatment.  She was started on maintenance treatment with Tecentriq Huey Bienenstock) status post 3 cycles. She has been tolerating this treatment well with no concerning complaints. She had a repeat CT scan of the chest, abdomen and pelvis performed recently.  I personally and independently reviewed the scan images and discussed the results with the patient today. Her scan showed no concerning findings for disease progression. I recommended for the patient to continue her current treatment with Tecentriq Huey Bienenstock) and she will proceed with cycle #4 today. I will see her back for follow-up visit in 3 weeks for evaluation before starting cycle #5. The patient was advised to call immediately if she has any concerning symptoms in the interval. The patient voices understanding of current disease status and treatment options and is in agreement with the current care plan. All questions were answered. The patient knows to call the clinic with any problems, questions or concerns.  We can certainly see the patient much sooner if necessary. I spent 15 minutes counseling the patient face to face. The total time spent in the appointment was 25 minutes.  Disclaimer: This note was dictated with voice recognition software. Similar sounding words can inadvertently be transcribed and may not be corrected upon review.

## 2018-09-01 NOTE — Telephone Encounter (Signed)
Scheduled appt per 10/1 los - pt to get an updated schedule next visit.

## 2018-09-22 ENCOUNTER — Inpatient Hospital Stay: Payer: Medicare Other

## 2018-09-22 ENCOUNTER — Encounter: Payer: Self-pay | Admitting: Internal Medicine

## 2018-09-22 ENCOUNTER — Inpatient Hospital Stay (HOSPITAL_BASED_OUTPATIENT_CLINIC_OR_DEPARTMENT_OTHER): Payer: Medicare Other | Admitting: Internal Medicine

## 2018-09-22 VITALS — BP 135/88 | HR 84 | Temp 97.6°F | Resp 18 | Ht 64.0 in | Wt 161.5 lb

## 2018-09-22 DIAGNOSIS — J449 Chronic obstructive pulmonary disease, unspecified: Secondary | ICD-10-CM

## 2018-09-22 DIAGNOSIS — R197 Diarrhea, unspecified: Secondary | ICD-10-CM

## 2018-09-22 DIAGNOSIS — C3412 Malignant neoplasm of upper lobe, left bronchus or lung: Secondary | ICD-10-CM | POA: Diagnosis not present

## 2018-09-22 DIAGNOSIS — Z79899 Other long term (current) drug therapy: Secondary | ICD-10-CM

## 2018-09-22 DIAGNOSIS — C3492 Malignant neoplasm of unspecified part of left bronchus or lung: Secondary | ICD-10-CM

## 2018-09-22 DIAGNOSIS — Z5112 Encounter for antineoplastic immunotherapy: Secondary | ICD-10-CM

## 2018-09-22 DIAGNOSIS — I10 Essential (primary) hypertension: Secondary | ICD-10-CM

## 2018-09-22 DIAGNOSIS — E039 Hypothyroidism, unspecified: Secondary | ICD-10-CM | POA: Diagnosis not present

## 2018-09-22 LAB — CBC WITH DIFFERENTIAL (CANCER CENTER ONLY)
Abs Immature Granulocytes: 0.03 10*3/uL (ref 0.00–0.07)
Basophils Absolute: 0 10*3/uL (ref 0.0–0.1)
Basophils Relative: 0 %
EOS ABS: 0.3 10*3/uL (ref 0.0–0.5)
EOS PCT: 4 %
HEMATOCRIT: 37.2 % (ref 36.0–46.0)
Hemoglobin: 12.2 g/dL (ref 12.0–15.0)
Immature Granulocytes: 1 %
LYMPHS ABS: 0.9 10*3/uL (ref 0.7–4.0)
Lymphocytes Relative: 14 %
MCH: 31.2 pg (ref 26.0–34.0)
MCHC: 32.8 g/dL (ref 30.0–36.0)
MCV: 95.1 fL (ref 80.0–100.0)
MONO ABS: 0.6 10*3/uL (ref 0.1–1.0)
Monocytes Relative: 9 %
Neutro Abs: 4.7 10*3/uL (ref 1.7–7.7)
Neutrophils Relative %: 72 %
Platelet Count: 190 10*3/uL (ref 150–400)
RBC: 3.91 MIL/uL (ref 3.87–5.11)
RDW: 14.7 % (ref 11.5–15.5)
WBC Count: 6.4 10*3/uL (ref 4.0–10.5)
nRBC: 0 % (ref 0.0–0.2)

## 2018-09-22 LAB — CMP (CANCER CENTER ONLY)
ALT: 6 U/L (ref 0–44)
ANION GAP: 11 (ref 5–15)
AST: 10 U/L — ABNORMAL LOW (ref 15–41)
Albumin: 3.5 g/dL (ref 3.5–5.0)
Alkaline Phosphatase: 59 U/L (ref 38–126)
BILIRUBIN TOTAL: 0.6 mg/dL (ref 0.3–1.2)
BUN: 7 mg/dL — AB (ref 8–23)
CALCIUM: 9.3 mg/dL (ref 8.9–10.3)
CO2: 27 mmol/L (ref 22–32)
Chloride: 104 mmol/L (ref 98–111)
Creatinine: 0.74 mg/dL (ref 0.44–1.00)
GFR, Est AFR Am: >60 mL/min (ref >60)
GFR, Estimated: >60 mL/min (ref >60)
Glucose, Bld: 96 mg/dL (ref 70–99)
POTASSIUM: 3.5 mmol/L (ref 3.5–5.1)
Sodium: 142 mmol/L (ref 135–145)
TOTAL PROTEIN: 6.9 g/dL (ref 6.5–8.1)

## 2018-09-22 MED ORDER — SODIUM CHLORIDE 0.9% FLUSH
10.0000 mL | INTRAVENOUS | Status: DC | PRN
  Administered 2018-09-22: 10 mL
  Filled 2018-09-22: qty 10

## 2018-09-22 MED ORDER — SODIUM CHLORIDE 0.9 % IV SOLN
Freq: Once | INTRAVENOUS | Status: AC
  Administered 2018-09-22: 13:00:00 via INTRAVENOUS
  Filled 2018-09-22: qty 250

## 2018-09-22 MED ORDER — SODIUM CHLORIDE 0.9 % IV SOLN
1200.0000 mg | Freq: Once | INTRAVENOUS | Status: AC
  Administered 2018-09-22: 1200 mg via INTRAVENOUS
  Filled 2018-09-22: qty 20

## 2018-09-22 MED ORDER — HEPARIN SOD (PORK) LOCK FLUSH 100 UNIT/ML IV SOLN
500.0000 [IU] | Freq: Once | INTRAVENOUS | Status: AC | PRN
  Administered 2018-09-22: 500 [IU]
  Filled 2018-09-22: qty 5

## 2018-09-22 NOTE — Patient Instructions (Signed)
Implanted Port Home Guide An implanted port is a type of central line that is placed under the skin. Central lines are used to provide IV access when treatment or nutrition needs to be given through a person's veins. Implanted ports are used for long-term IV access. An implanted port may be placed because:  You need IV medicine that would be irritating to the small veins in your hands or arms.  You need long-term IV medicines, such as antibiotics.  You need IV nutrition for a long period.  You need frequent blood draws for lab tests.  You need dialysis.  Implanted ports are usually placed in the chest area, but they can also be placed in the upper arm, the abdomen, or the leg. An implanted port has two main parts:  Reservoir. The reservoir is round and will appear as a small, raised area under your skin. The reservoir is the part where a needle is inserted to give medicines or draw blood.  Catheter. The catheter is a thin, flexible tube that extends from the reservoir. The catheter is placed into a large vein. Medicine that is inserted into the reservoir goes into the catheter and then into the vein.  How will I care for my incision site? Do not get the incision site wet. Bathe or shower as directed by your health care provider. How is my port accessed? Special steps must be taken to access the port:  Before the port is accessed, a numbing cream can be placed on the skin. This helps numb the skin over the port site.  Your health care provider uses a sterile technique to access the port. ? Your health care provider must put on a mask and sterile gloves. ? The skin over your port is cleaned carefully with an antiseptic and allowed to dry. ? The port is gently pinched between sterile gloves, and a needle is inserted into the port.  Only "non-coring" port needles should be used to access the port. Once the port is accessed, a blood return should be checked. This helps ensure that the port  is in the vein and is not clogged.  If your port needs to remain accessed for a constant infusion, a clear (transparent) bandage will be placed over the needle site. The bandage and needle will need to be changed every week, or as directed by your health care provider.  Keep the bandage covering the needle clean and dry. Do not get it wet. Follow your health care provider's instructions on how to take a shower or bath while the port is accessed.  If your port does not need to stay accessed, no bandage is needed over the port.  What is flushing? Flushing helps keep the port from getting clogged. Follow your health care provider's instructions on how and when to flush the port. Ports are usually flushed with saline solution or a medicine called heparin. The need for flushing will depend on how the port is used.  If the port is used for intermittent medicines or blood draws, the port will need to be flushed: ? After medicines have been given. ? After blood has been drawn. ? As part of routine maintenance.  If a constant infusion is running, the port may not need to be flushed.  How long will my port stay implanted? The port can stay in for as long as your health care provider thinks it is needed. When it is time for the port to come out, surgery will be   done to remove it. The procedure is similar to the one performed when the port was put in. When should I seek immediate medical care? When you have an implanted port, you should seek immediate medical care if:  You notice a bad smell coming from the incision site.  You have swelling, redness, or drainage at the incision site.  You have more swelling or pain at the port site or the surrounding area.  You have a fever that is not controlled with medicine.  This information is not intended to replace advice given to you by your health care provider. Make sure you discuss any questions you have with your health care provider. Document  Released: 11/18/2005 Document Revised: 04/25/2016 Document Reviewed: 07/26/2013 Elsevier Interactive Patient Education  2017 [employer]  

## 2018-09-22 NOTE — Progress Notes (Signed)
[place] [phone]   [phone]  OFFICE PROGRESS NOTE  Redmond School, MD [address] [place] [address]  DIAGNOSIS: Metastatic small cell lung cancer initially diagnosed as Limited stage (T1b, N2, M0) small cell lung cancer presented with left lower lobe/infrahilar mass and large mediastinal lymphadenopathy diagnosed in October 2017.  PRIOR THERAPY: 1) Systemic chemotherapy with cisplatin 60 MG/M2 on day 1 and etoposide 120 MG/M2 on days 1, 2 and 3 status post 1 cycle. This was discontinued secondary to intolerance. 2) Systemic chemotherapy with carboplatin for AUC of 4 on day 1 and etoposide 100 MG/M2 on days 1, 2 and 3 with Neulasta support on day 4 every 3 weeks. Status post 5 cycles. This was concurrent with radiation in [place], [place]. 3) prophylactic cranial irradiation. 4) Systemic chemotherapy with carboplatin for AUC of 5 on day 1, etoposide 100 mg/M2 on days 1, 2 and 3 as well as a Tecentriq (Atezolizumab) 1200 mg IV every 3 weeks with Neulasta support.  First dose February 17, 2018.  Status post 6 cycles of partial response.   CURRENT THERAPY: Maintenance treatment with immunotherapy with Tecentriq 1200 mg IV every 3 weeks.  First dose June 30, 2018.  Status post 4 cycles.  INTERVAL HISTORY: Rebekah Henderson 62 y.o. female returns to the clinic today for follow-up visit.  The patient is feeling fine today with no specific complaints except for recent sore throat and chest congestion.  She also has few episodes of diarrhea.  She denied having any nausea or vomiting.  She denied having any fever or chills.  She continues to have intermittent left-sided chest pain but no significant shortness of breath or hemoptysis.  She has no significant weight loss or night sweats.  The patient is here today for evaluation before starting cycle #5 of her maintenance treatment with Tecentriq Huey Bienenstock).  MEDICAL HISTORY: Past Medical  History:  Diagnosis Date  . 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 Anxiety    takes Prozac daily  . Arthritis   . Benign fundic gland polyps of stomach   . Colon polyps   . COPD (chronic obstructive pulmonary disease) ([place])   . Dehydration 03/06/2017  . Diverticulitis   . Dyspnea    with exertion  . Encounter for antineoplastic chemotherapy 12/03/2016  . Fibromyalgia   . GERD (gastroesophageal reflux disease)    takes Pantoprazole daily  . Hypertension    takes Metoprolol,Triamterene-HCTZ,and Amlodipine daily  . Hypothyroidism    takes Synthroid daily  . IBS (irritable bowel syndrome)   . lung ca dx'd 10/02/2016   skin, lung  . PONV (postoperative nausea and vomiting)    pt also states that she had some difficulty breathing after cervical fusion    ALLERGIES:  is allergic to penicillins; codeine; fentanyl; ranitidine hcl; keflex [cephalexin]; and lyrica [pregabalin].  MEDICATIONS:  Current Outpatient Medications  Medication Sig Dispense Refill  . albuterol (PROVENTIL HFA;VENTOLIN HFA) 108 (90 Base) MCG/ACT inhaler Inhale 2 puffs into the lungs every 4 (four) hours as needed for wheezing or shortness of breath.    . ALPRAZolam (XANAX) 0.25 MG tablet Take 2 tablets (0.5 mg total) by mouth at bedtime as needed for anxiety. 30 tablet 0  . amLODipine (NORVASC) 5 MG tablet Take 5 mg by mouth daily.      Marland Kitchen dicyclomine (BENTYL) 20 MG tablet Take 20 mg by mouth 3 (three) times daily as needed for spasms.    Marland Kitchen  estazolam (PROSOM) 2 MG tablet Take 2 mg by mouth at bedtime.    Marland Kitchen estradiol (ESTRACE) 2 MG tablet Take 2 mg by mouth daily.      Marland Kitchen FeFum-FePoly-FA-B Cmp-C-Biot (INTEGRA PLUS) CAPS TAKE 1 CAPSULE BY MOUTH ONCE DAILY IN THE MORNING. 30 capsule 0  . FLUoxetine (PROZAC) 40 MG capsule Take 40 mg by mouth daily.   2  . HYDROcodone-acetaminophen (NORCO/VICODIN) 5-325 MG tablet Take 1 tablet by mouth every 4 (four) hours as needed for moderate pain.   0  . levothyroxine  (SYNTHROID, LEVOTHROID) 25 MCG tablet 25 mcg.  0  . levothyroxine (SYNTHROID, LEVOTHROID) 50 MCG tablet Take 50 mcg by mouth daily before breakfast.     . lidocaine-prilocaine (EMLA) cream Apply 1 application topically as needed. To numb skin over port a cath: Squeeze a  small amount on cotton ball and place over port site 1-2 hours prior to chemotherapy. 30 g 0  . loperamide (IMODIUM) 2 MG capsule Take 2 mg by mouth every 2 (two) hours as needed for diarrhea or loose stools.    . metoprolol succinate (TOPROL-XL) 25 MG 24 hr tablet Take 25 mg by mouth daily.    . ondansetron (ZOFRAN) 4 MG tablet Take 1 tablet (4 mg total) by mouth every 8 (eight) hours as needed for nausea or vomiting. 20 tablet 1  . ondansetron (ZOFRAN-ODT) 4 MG disintegrating tablet DISSOLVE 1 TABLET BY MOUTH EVERY 8 HOURS AS NEEDED FOR NAUSEA AND VOMITING. (Patient not taking: Reported on 09/01/2018) 20 tablet 1  . pantoprazole (PROTONIX) 40 MG tablet Take 40 mg by mouth 2 (two) times daily before a meal.   10  . potassium chloride SA (K-DUR,KLOR-CON) 20 MEQ tablet TAKE 1 TABLET BY MOUTH TWICE DAILY FOR 7 DAYS THEN 1 TABLET DAILY. 30 tablet 0  . promethazine (PHENERGAN) 25 MG tablet Take 1 tablet (25 mg total) by mouth every 6 (six) hours as needed. 30 tablet 10  . senna-docusate (SENOKOT-S) 8.6-50 MG tablet Take 1 tablet by mouth daily as needed.     No current facility-administered medications for this visit.     SURGICAL HISTORY:  Past Surgical History:  Procedure Laterality Date  . BIOPSY N/A 05/25/2013   Procedure: BIOPSIES (Random Colon; Duodenal; Gastric);  Surgeon: Danie Binder, MD;  Location: [facility];  Service: Endoscopy;  Laterality: N/A;  . BLADDER SUSPENSION    . BREAST ENHANCEMENT SURGERY    . BREAST IMPLANT REMOVAL    . CERVICAL FUSION  AUG 2013  . CHOLECYSTECTOMY  1999  . COLONOSCOPY  2007 [place]   POLYPS  . COLONOSCOPY WITH PROPOFOL N/A 05/25/2013   Procedure: COLONOSCOPY WITH PROPOFOL(at cecum 0957)  total withdrawal time=78min);  Surgeon: Danie Binder, MD;  Location: [facility];  Service: Endoscopy;  Laterality: N/A;  . ESOPHAGOGASTRODUODENOSCOPY (EGD) WITH PROPOFOL N/A 05/25/2013   Procedure: ESOPHAGOGASTRODUODENOSCOPY (EGD) WITH PROPOFOL;  Surgeon: Danie Binder, MD;  Location: [facility];  Service: Endoscopy;  Laterality: N/A;  . FOOT SURGERY    . IR FLUORO GUIDE PORT INSERTION RIGHT  03/04/2018  . IR US GUIDE VASC ACCESS RIGHT  03/04/2018  . POLYPECTOMY N/A 05/25/2013   Procedure: POLYPECTOMY (Rectal and Gastric);  Surgeon: Danie Binder, MD;  Location: [facility];  Service: Endoscopy;  Laterality: N/A;  . TONSILLECTOMY    . UPPER GASTROINTESTINAL ENDOSCOPY    . VIDEO BRONCHOSCOPY WITH ENDOBRONCHIAL ULTRASOUND  09/12/2016   Procedure: VIDEO BRONCHOSCOPY WITH ENDOBRONCHIAL ULTRASOUND AND BIOPSY;  Surgeon: Nathaneil Canary  Jerral Ralph, MD;  Location: [facility];  Service: Cardiopulmonary;;    REVIEW OF SYSTEMS:  A comprehensive review of systems was negative except for: Constitutional: positive for fatigue Respiratory: positive for cough and pleurisy/chest pain Gastrointestinal: positive for diarrhea   PHYSICAL EXAMINATION: General appearance: alert, cooperative, fatigued and no distress Head: Normocephalic, without obvious abnormality, atraumatic Neck: no adenopathy, no JVD, supple, symmetrical, trachea midline and thyroid not enlarged, symmetric, no tenderness/mass/nodules Lymph nodes: Cervical, supraclavicular, and axillary nodes normal. Resp: clear to auscultation bilaterally Back: symmetric, no curvature. ROM normal. No CVA tenderness. Cardio: regular rate and rhythm, S1, S2 normal, no murmur, click, rub or gallop GI: soft, non-tender; bowel sounds normal; no masses,  no organomegaly Extremities: extremities normal, atraumatic, no cyanosis or edema   ECOG PERFORMANCE STATUS: 1 - Symptomatic but completely ambulatory  Blood pressure 135/88, pulse 84, temperature 97.6 F (36.4 C), temperature source Oral,  resp. rate 18, height 5\' 4"  (1.626 m), weight 161 lb 8 oz (73.3 kg), SpO2 93 %.  LABORATORY DATA: Lab Results  Component Value Date   WBC 4.8 09/01/2018   HGB 11.7 09/01/2018   HCT 34.1 (L) 09/01/2018   MCV 95.3 09/01/2018   PLT 168 09/01/2018      Chemistry      Component Value Date/Time   NA 142 09/01/2018 1012   NA 141 09/08/2017 1009   K 3.5 09/01/2018 1012   K 3.5 09/08/2017 1009   CL 104 09/01/2018 1012   CO2 28 09/01/2018 1012   CO2 29 09/08/2017 1009   BUN 7 (L) 09/01/2018 1012   BUN 5.8 (L) 09/08/2017 1009   CREATININE 0.69 09/01/2018 1012   CREATININE 0.7 09/08/2017 1009      Component Value Date/Time   CALCIUM 9.0 09/01/2018 1012   CALCIUM 9.0 09/08/2017 1009   ALKPHOS 51 09/01/2018 1012   ALKPHOS 49 09/08/2017 1009   AST 10 (L) 09/01/2018 1012   AST 9 09/08/2017 1009   ALT <6 09/01/2018 1012   ALT <6 09/08/2017 1009   BILITOT 0.6 09/01/2018 1012   BILITOT 0.51 09/08/2017 1009       RADIOGRAPHIC STUDIES: Ct Chest W Contrast  Result Date: 08/26/2018 CLINICAL DATA:  Left upper lobe small cell lung cancer EXAM: CT CHEST, ABDOMEN, AND PELVIS WITH CONTRAST TECHNIQUE: Multidetector CT imaging of the chest, abdomen and pelvis was performed following the standard protocol during bolus administration of intravenous contrast. CONTRAST:  113mL OMNIPAQUE IOHEXOL 300 MG/ML  SOLN COMPARISON:  06/29/2018 FINDINGS: CT CHEST FINDINGS Cardiovascular: Normal heart size. Mild pericardial thickening appears similar to previous exam. Aortic atherosclerosis. Mediastinum/Nodes: Normal appearance of the thyroid gland. The trachea appears patent and is midline. Moderate hiatal hernia. No supraclavicular or axillary adenopathy. Soft tissue within the left paratracheal region is again noted measuring 2.5 x 2.2 cm, image 23/2. Not significantly changed from 06/29/2018. No hilar or mediastinal adenopathy identified. Lungs/Pleura: No pleural effusion. Fibrosis and masslike architectural  distortion within the perihilar and paramediastinal left lung is identified reflecting changes of external beam radiation. The appearance is stable from previous exam. New multifocal bilateral lower lobe patchy airspace and ground-glass densities are noted involving both lower lobes. This is favored to represent changes secondary to inflammation or infection. Musculoskeletal: No chest wall mass or suspicious bone lesions identified. CT ABDOMEN PELVIS FINDINGS Hepatobiliary: No focal liver abnormality. Previous cholecystectomy. Unchanged increase caliber of the CBD measuring 1 cm. Pancreas: Unremarkable. No pancreatic ductal dilatation or surrounding inflammatory changes. Spleen: Normal in size without  focal abnormality. Adrenals/Urinary Tract: Normal adrenal glands. The kidneys are unremarkable. No mass or hydronephrosis. Small cysts noted within the upper pole of right kidney. Urinary bladder appears normal. Stomach/Bowel: Hiatal hernia noted. The small bowel loops have a normal course and caliber without evidence for bowel obstruction. Numerous colonic diverticula noted without acute inflammation. Vascular/Lymphatic: Aortic atherosclerosis. No aneurysm. No abdominopelvic adenopathy. Reproductive: Status post hysterectomy. No adnexal masses. Other: No abdominal wall hernia or abnormality. No abdominopelvic ascites. Musculoskeletal: Sclerotic lesion involving the L3 vertebra appears unchanged measuring 2 cm, image 103/5. Stable sclerotic lesion within the left acetabulum and left femoral head. IMPRESSION: 1. Stable appearance of abnormal soft tissue compatible with recurrent disease within the [facility] window. Paramediastinal fibrosis and masslike architectural distortion in the left lung is also unchanged compatible with external beam radiation. No new sites of disease identified. 2. There are new multifocal patchy ground-glass and airspace opacities most compatible with a inflammatory or infectious process. Pulmonary  toxicity secondary to immunotherapy is not excluded. Clinical correlation advised. 3. Stable sclerotic lesion within the L3 vertebra. 4.  Aortic Atherosclerosis (ICD10-I70.0). Electronically Signed   By: Kerby Moors M.D.   On: 08/26/2018 14:53   Ct Abdomen Pelvis W Contrast  Result Date: 08/26/2018 CLINICAL DATA:  Left upper lobe small cell lung cancer EXAM: CT CHEST, ABDOMEN, AND PELVIS WITH CONTRAST TECHNIQUE: Multidetector CT imaging of the chest, abdomen and pelvis was performed following the standard protocol during bolus administration of intravenous contrast. CONTRAST:  147mL OMNIPAQUE IOHEXOL 300 MG/ML  SOLN COMPARISON:  06/29/2018 FINDINGS: CT CHEST FINDINGS Cardiovascular: Normal heart size. Mild pericardial thickening appears similar to previous exam. Aortic atherosclerosis. Mediastinum/Nodes: Normal appearance of the thyroid gland. The trachea appears patent and is midline. Moderate hiatal hernia. No supraclavicular or axillary adenopathy. Soft tissue within the left paratracheal region is again noted measuring 2.5 x 2.2 cm, image 23/2. Not significantly changed from 06/29/2018. No hilar or mediastinal adenopathy identified. Lungs/Pleura: No pleural effusion. Fibrosis and masslike architectural distortion within the perihilar and paramediastinal left lung is identified reflecting changes of external beam radiation. The appearance is stable from previous exam. New multifocal bilateral lower lobe patchy airspace and ground-glass densities are noted involving both lower lobes. This is favored to represent changes secondary to inflammation or infection. Musculoskeletal: No chest wall mass or suspicious bone lesions identified. CT ABDOMEN PELVIS FINDINGS Hepatobiliary: No focal liver abnormality. Previous cholecystectomy. Unchanged increase caliber of the CBD measuring 1 cm. Pancreas: Unremarkable. No pancreatic ductal dilatation or surrounding inflammatory changes. Spleen: Normal in size without focal  abnormality. Adrenals/Urinary Tract: Normal adrenal glands. The kidneys are unremarkable. No mass or hydronephrosis. Small cysts noted within the upper pole of right kidney. Urinary bladder appears normal. Stomach/Bowel: Hiatal hernia noted. The small bowel loops have a normal course and caliber without evidence for bowel obstruction. Numerous colonic diverticula noted without acute inflammation. Vascular/Lymphatic: Aortic atherosclerosis. No aneurysm. No abdominopelvic adenopathy. Reproductive: Status post hysterectomy. No adnexal masses. Other: No abdominal wall hernia or abnormality. No abdominopelvic ascites. Musculoskeletal: Sclerotic lesion involving the L3 vertebra appears unchanged measuring 2 cm, image 103/5. Stable sclerotic lesion within the left acetabulum and left femoral head. IMPRESSION: 1. Stable appearance of abnormal soft tissue compatible with recurrent disease within the [facility] window. Paramediastinal fibrosis and masslike architectural distortion in the left lung is also unchanged compatible with external beam radiation. No new sites of disease identified. 2. There are new multifocal patchy ground-glass and airspace opacities most compatible with a inflammatory or infectious process.  Pulmonary toxicity secondary to immunotherapy is not excluded. Clinical correlation advised. 3. Stable sclerotic lesion within the L3 vertebra. 4.  Aortic Atherosclerosis (ICD10-I70.0). Electronically Signed   By: Kerby Moors M.D.   On: 08/26/2018 14:53    ASSESSMENT AND PLAN:  This is a very pleasant 62 years old white female was limited stage small cell lung cancer, status post 6 cycles of systemic chemotherapy with carboplatin and etoposide concurrent with radiation and followed by prophylactic cranial irradiation. Patient has been in observation for more than a year.  She has been doing fine but recently started complaining of increasing fatigue and weakness as well as headache. The patient had evidence for  disease recurrence and she was a started on treatment with systemic chemotherapy with carboplatin, etoposide and Tecentriq.  She is status post 6 cycles.  The patient had no evidence for disease progression after the induction phase of her treatment.  She was started on maintenance treatment with Tecentriq Huey Bienenstock) status post 4 cycles. She continues to tolerate the treatment well with no concerning adverse effects. I recommended for the patient to proceed with cycle #5 today as a schedule. I will see her back for follow-up visit in 3 weeks for evaluation before starting cycle #6. For the diarrhea she is currently using Imodium but she was advised to call immediately if she has worsening diarrhea as the patient may require treatment with the steroids for any suspicious immunotherapy mediated colitis. The patient voices understanding of current disease status and treatment options and is in agreement with the current care plan. All questions were answered. The patient knows to call the clinic with any problems, questions or concerns. We can certainly see the patient much sooner if necessary. I spent 10 minutes counseling the patient face to face. The total time spent in the appointment was 15 minutes.  Disclaimer: This note was dictated with voice recognition software. Similar sounding words can inadvertently be transcribed and may not be corrected upon review.

## 2018-09-28 ENCOUNTER — Other Ambulatory Visit: Payer: Self-pay | Admitting: Oncology

## 2018-09-28 ENCOUNTER — Telehealth: Payer: Self-pay | Admitting: Medical Oncology

## 2018-09-28 DIAGNOSIS — R112 Nausea with vomiting, unspecified: Secondary | ICD-10-CM

## 2018-09-28 NOTE — Telephone Encounter (Signed)
Per Julien Nordmann it is okay to get tooth fixed.

## 2018-09-28 NOTE — Telephone Encounter (Signed)
Broken tooth -has to be "put asleep " for this to be fixed. ?okay to proceed. Next Tecentriq 11/12

## 2018-10-01 DIAGNOSIS — J449 Chronic obstructive pulmonary disease, unspecified: Secondary | ICD-10-CM | POA: Diagnosis not present

## 2018-10-01 DIAGNOSIS — R0902 Hypoxemia: Secondary | ICD-10-CM | POA: Diagnosis not present

## 2018-10-07 DIAGNOSIS — Z23 Encounter for immunization: Secondary | ICD-10-CM | POA: Diagnosis not present

## 2018-10-13 ENCOUNTER — Inpatient Hospital Stay: Payer: Medicare Other

## 2018-10-13 ENCOUNTER — Encounter: Payer: Self-pay | Admitting: Internal Medicine

## 2018-10-13 ENCOUNTER — Inpatient Hospital Stay: Payer: Medicare Other | Attending: Internal Medicine

## 2018-10-13 ENCOUNTER — Telehealth: Payer: Self-pay | Admitting: Internal Medicine

## 2018-10-13 ENCOUNTER — Inpatient Hospital Stay (HOSPITAL_BASED_OUTPATIENT_CLINIC_OR_DEPARTMENT_OTHER): Payer: Medicare Other | Admitting: Internal Medicine

## 2018-10-13 VITALS — BP 142/82 | HR 68 | Temp 97.6°F | Resp 16 | Ht 64.0 in | Wt 164.1 lb

## 2018-10-13 DIAGNOSIS — F419 Anxiety disorder, unspecified: Secondary | ICD-10-CM | POA: Insufficient documentation

## 2018-10-13 DIAGNOSIS — C3412 Malignant neoplasm of upper lobe, left bronchus or lung: Secondary | ICD-10-CM

## 2018-10-13 DIAGNOSIS — E860 Dehydration: Secondary | ICD-10-CM

## 2018-10-13 DIAGNOSIS — J449 Chronic obstructive pulmonary disease, unspecified: Secondary | ICD-10-CM

## 2018-10-13 DIAGNOSIS — C787 Secondary malignant neoplasm of liver and intrahepatic bile duct: Secondary | ICD-10-CM | POA: Insufficient documentation

## 2018-10-13 DIAGNOSIS — C7951 Secondary malignant neoplasm of bone: Secondary | ICD-10-CM | POA: Insufficient documentation

## 2018-10-13 DIAGNOSIS — I10 Essential (primary) hypertension: Secondary | ICD-10-CM | POA: Diagnosis not present

## 2018-10-13 DIAGNOSIS — Z79899 Other long term (current) drug therapy: Secondary | ICD-10-CM | POA: Insufficient documentation

## 2018-10-13 DIAGNOSIS — R112 Nausea with vomiting, unspecified: Secondary | ICD-10-CM

## 2018-10-13 DIAGNOSIS — Z5112 Encounter for antineoplastic immunotherapy: Secondary | ICD-10-CM | POA: Diagnosis not present

## 2018-10-13 DIAGNOSIS — C3492 Malignant neoplasm of unspecified part of left bronchus or lung: Secondary | ICD-10-CM

## 2018-10-13 DIAGNOSIS — E039 Hypothyroidism, unspecified: Secondary | ICD-10-CM | POA: Diagnosis not present

## 2018-10-13 LAB — CMP (CANCER CENTER ONLY)
ALT: <6 U/L (ref 0–44)
AST: 11 U/L — AB (ref 15–41)
Albumin: 3.5 g/dL (ref 3.5–5.0)
Alkaline Phosphatase: 53 U/L (ref 38–126)
Anion gap: 9 (ref 5–15)
BUN: 11 mg/dL (ref 8–23)
CO2: 28 mmol/L (ref 22–32)
Calcium: 9.1 mg/dL (ref 8.9–10.3)
Chloride: 104 mmol/L (ref 98–111)
Creatinine: 0.72 mg/dL (ref 0.44–1.00)
Glucose, Bld: 89 mg/dL (ref 70–99)
POTASSIUM: 3.4 mmol/L — AB (ref 3.5–5.1)
SODIUM: 141 mmol/L (ref 135–145)
Total Bilirubin: 0.6 mg/dL (ref 0.3–1.2)
Total Protein: 6.6 g/dL (ref 6.5–8.1)

## 2018-10-13 LAB — CBC WITH DIFFERENTIAL (CANCER CENTER ONLY)
ABS IMMATURE GRANULOCYTES: 0.04 10*3/uL (ref 0.00–0.07)
BASOS ABS: 0 10*3/uL (ref 0.0–0.1)
Basophils Relative: 0 %
Eosinophils Absolute: 0.3 10*3/uL (ref 0.0–0.5)
Eosinophils Relative: 4 %
HCT: 37 % (ref 36.0–46.0)
HEMOGLOBIN: 12.1 g/dL (ref 12.0–15.0)
Immature Granulocytes: 1 %
LYMPHS PCT: 21 %
Lymphs Abs: 1.2 10*3/uL (ref 0.7–4.0)
MCH: 30.8 pg (ref 26.0–34.0)
MCHC: 32.7 g/dL (ref 30.0–36.0)
MCV: 94.1 fL (ref 80.0–100.0)
Monocytes Absolute: 0.5 10*3/uL (ref 0.1–1.0)
Monocytes Relative: 9 %
NEUTROS ABS: 3.8 10*3/uL (ref 1.7–7.7)
NRBC: 0 % (ref 0.0–0.2)
Neutrophils Relative %: 65 %
Platelet Count: 174 10*3/uL (ref 150–400)
RBC: 3.93 MIL/uL (ref 3.87–5.11)
RDW: 14.6 % (ref 11.5–15.5)
WBC: 5.8 10*3/uL (ref 4.0–10.5)

## 2018-10-13 MED ORDER — SODIUM CHLORIDE 0.9 % IV SOLN
1200.0000 mg | Freq: Once | INTRAVENOUS | Status: AC
  Administered 2018-10-13: 1200 mg via INTRAVENOUS
  Filled 2018-10-13: qty 20

## 2018-10-13 MED ORDER — HEPARIN SOD (PORK) LOCK FLUSH 100 UNIT/ML IV SOLN
500.0000 [IU] | Freq: Once | INTRAVENOUS | Status: AC | PRN
  Administered 2018-10-13: 500 [IU]
  Filled 2018-10-13: qty 5

## 2018-10-13 MED ORDER — SODIUM CHLORIDE 0.9% FLUSH
10.0000 mL | INTRAVENOUS | Status: DC | PRN
  Administered 2018-10-13: 10 mL
  Filled 2018-10-13: qty 10

## 2018-10-13 MED ORDER — SODIUM CHLORIDE 0.9 % IV SOLN
Freq: Once | INTRAVENOUS | Status: AC
  Administered 2018-10-13: 10:00:00 via INTRAVENOUS
  Filled 2018-10-13: qty 250

## 2018-10-13 NOTE — Progress Notes (Signed)
[facility] [phone]   [phone]  OFFICE PROGRESS NOTE  Redmond School, MD [address] [place] [address]  DIAGNOSIS: Metastatic small cell lung cancer initially diagnosed as Limited stage (T1b, N2, M0) small cell lung cancer presented with left lower lobe/infrahilar mass and large mediastinal lymphadenopathy diagnosed in October 2017.  PRIOR THERAPY: 1) Systemic chemotherapy with cisplatin 60 MG/M2 on day 1 and etoposide 120 MG/M2 on days 1, 2 and 3 status post 1 cycle. This was discontinued secondary to intolerance. 2) Systemic chemotherapy with carboplatin for AUC of 4 on day 1 and etoposide 100 MG/M2 on days 1, 2 and 3 with Neulasta support on day 4 every 3 weeks. Status post 5 cycles. This was concurrent with radiation in [place], [place]. 3) prophylactic cranial irradiation. 4) Systemic chemotherapy with carboplatin for AUC of 5 on day 1, etoposide 100 mg/M2 on days 1, 2 and 3 as well as a Tecentriq (Atezolizumab) 1200 mg IV every 3 weeks with Neulasta support.  First dose February 17, 2018.  Status post 6 cycles of partial response.   CURRENT THERAPY: Maintenance treatment with immunotherapy with Tecentriq 1200 mg IV every 3 weeks.  First dose June 30, 2018.  Status post 5 cycles.  INTERVAL HISTORY: Rebekah Henderson 62 y.o. female returns to the clinic today for follow-up visit.  The patient is feeling fine today with no specific complaints except for fatigue.  She denied having any chest pain but has shortness of breath with exertion with no cough or hemoptysis.  She denied having any current nausea or vomiting but she has occasional nausea in the morning after her treatment.  She denied having any weight loss or night sweats.  The patient continues to complain of pain in the mid back and she is currently on Vicodin.  She is here today for evaluation before starting cycle #6 of her maintenance treatment with Tecentriq  Huey Bienenstock).  MEDICAL HISTORY: Past Medical History:  Diagnosis Date  . 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 Anxiety    takes Prozac daily  . Arthritis   . Benign fundic gland polyps of stomach   . Colon polyps   . COPD (chronic obstructive pulmonary disease) ([place])   . Dehydration 03/06/2017  . Diverticulitis   . Dyspnea    with exertion  . Encounter for antineoplastic chemotherapy 12/03/2016  . Fibromyalgia   . GERD (gastroesophageal reflux disease)    takes Pantoprazole daily  . Hypertension    takes Metoprolol,Triamterene-HCTZ,and Amlodipine daily  . Hypothyroidism    takes Synthroid daily  . IBS (irritable bowel syndrome)   . lung ca dx'd 10/02/2016   skin, lung  . PONV (postoperative nausea and vomiting)    pt also states that she had some difficulty breathing after cervical fusion    ALLERGIES:  is allergic to penicillins; codeine; fentanyl; ranitidine hcl; keflex [cephalexin]; and lyrica [pregabalin].  MEDICATIONS:  Current Outpatient Medications  Medication Sig Dispense Refill  . albuterol (PROVENTIL HFA;VENTOLIN HFA) 108 (90 Base) MCG/ACT inhaler Inhale 2 puffs into the lungs every 4 (four) hours as needed for wheezing or shortness of breath.    . ALPRAZolam (XANAX) 0.25 MG tablet Take 2 tablets (0.5 mg total) by mouth at bedtime as needed for anxiety. 30 tablet 0  . amLODipine (NORVASC) 5 MG tablet Take 5 mg by mouth daily.      Marland Kitchen dicyclomine (BENTYL) 20 MG tablet Take 20 mg by mouth 3 (three)  times daily as needed for spasms.    Marland Kitchen estazolam (PROSOM) 2 MG tablet Take 2 mg by mouth at bedtime.    Marland Kitchen estradiol (ESTRACE) 2 MG tablet Take 2 mg by mouth daily.      Marland Kitchen FeFum-FePoly-FA-B Cmp-C-Biot (INTEGRA PLUS) CAPS TAKE 1 CAPSULE BY MOUTH ONCE DAILY IN THE MORNING. 30 capsule 0  . FLUoxetine (PROZAC) 40 MG capsule Take 40 mg by mouth daily.   2  . HYDROcodone-acetaminophen (NORCO/VICODIN) 5-325 MG tablet Take 1 tablet by mouth every 4 (four) hours as  needed for moderate pain.   0  . levothyroxine (SYNTHROID, LEVOTHROID) 50 MCG tablet Take 50 mcg by mouth daily before breakfast.     . lidocaine-prilocaine (EMLA) cream Apply 1 application topically as needed. To numb skin over port a cath: Squeeze a  small amount on cotton ball and place over port site 1-2 hours prior to chemotherapy. 30 g 0  . loperamide (IMODIUM) 2 MG capsule Take 2 mg by mouth every 2 (two) hours as needed for diarrhea or loose stools.    . metoprolol succinate (TOPROL-XL) 25 MG 24 hr tablet Take 25 mg by mouth daily.    . ondansetron (ZOFRAN) 4 MG tablet Take 1 tablet (4 mg total) by mouth every 8 (eight) hours as needed for nausea or vomiting. 20 tablet 1  . ondansetron (ZOFRAN-ODT) 4 MG disintegrating tablet DISSOLVE 1 TABLET BY MOUTH EVERY 8 HOURS AS NEEDED FOR NAUSEA AND VOMITING. 20 tablet 0  . pantoprazole (PROTONIX) 40 MG tablet Take 40 mg by mouth 2 (two) times daily before a meal.   10  . potassium chloride SA (K-DUR,KLOR-CON) 20 MEQ tablet TAKE 1 TABLET BY MOUTH TWICE DAILY FOR 7 DAYS THEN 1 TABLET DAILY. 30 tablet 0  . promethazine (PHENERGAN) 25 MG tablet Take 1 tablet (25 mg total) by mouth every 6 (six) hours as needed. 30 tablet 10  . senna-docusate (SENOKOT-S) 8.6-50 MG tablet Take 1 tablet by mouth daily as needed.     No current facility-administered medications for this visit.     SURGICAL HISTORY:  Past Surgical History:  Procedure Laterality Date  . BIOPSY N/A 05/25/2013   Procedure: BIOPSIES (Random Colon; Duodenal; Gastric);  Surgeon: Danie Binder, MD;  Location: [facility];  Service: Endoscopy;  Laterality: N/A;  . BLADDER SUSPENSION    . BREAST ENHANCEMENT SURGERY    . BREAST IMPLANT REMOVAL    . CERVICAL FUSION  AUG 2013  . CHOLECYSTECTOMY  1999  . COLONOSCOPY  2007 [facility]   POLYPS  . COLONOSCOPY WITH PROPOFOL N/A 05/25/2013   Procedure: COLONOSCOPY WITH PROPOFOL(at cecum 0957) total withdrawal time=24min);  Surgeon: Danie Binder, MD;   Location: [facility];  Service: Endoscopy;  Laterality: N/A;  . ESOPHAGOGASTRODUODENOSCOPY (EGD) WITH PROPOFOL N/A 05/25/2013   Procedure: ESOPHAGOGASTRODUODENOSCOPY (EGD) WITH PROPOFOL;  Surgeon: Danie Binder, MD;  Location: [facility];  Service: Endoscopy;  Laterality: N/A;  . FOOT SURGERY    . IR FLUORO GUIDE PORT INSERTION RIGHT  03/04/2018  . IR US GUIDE VASC ACCESS RIGHT  03/04/2018  . POLYPECTOMY N/A 05/25/2013   Procedure: POLYPECTOMY (Rectal and Gastric);  Surgeon: Danie Binder, MD;  Location: [facility];  Service: Endoscopy;  Laterality: N/A;  . TONSILLECTOMY    . UPPER GASTROINTESTINAL ENDOSCOPY    . VIDEO BRONCHOSCOPY WITH ENDOBRONCHIAL ULTRASOUND  09/12/2016   Procedure: VIDEO BRONCHOSCOPY WITH ENDOBRONCHIAL ULTRASOUND AND BIOPSY;  Surgeon: Juanito Doom, MD;  Location: [facility];  Service: Cardiopulmonary;;    REVIEW OF SYSTEMS:  A comprehensive review of systems was negative except for: Constitutional: positive for fatigue Respiratory: positive for cough Musculoskeletal: positive for back pain   PHYSICAL EXAMINATION: General appearance: alert, cooperative, fatigued and no distress Head: Normocephalic, without obvious abnormality, atraumatic Neck: no adenopathy, no JVD, supple, symmetrical, trachea midline and thyroid not enlarged, symmetric, no tenderness/mass/nodules Lymph nodes: Cervical, supraclavicular, and axillary nodes normal. Resp: clear to auscultation bilaterally Back: symmetric, no curvature. ROM normal. No CVA tenderness. Cardio: regular rate and rhythm, S1, S2 normal, no murmur, click, rub or gallop GI: soft, non-tender; bowel sounds normal; no masses,  no organomegaly Extremities: extremities normal, atraumatic, no cyanosis or edema   ECOG PERFORMANCE STATUS: 1 - Symptomatic but completely ambulatory  Blood pressure (!) 142/82, pulse 68, temperature 97.6 F (36.4 C), temperature source Oral, resp. rate 16, height 5\' 4"  (1.626 m), weight 164 lb 1.6 oz (74.4 kg), SpO2 92  %.  LABORATORY DATA: Lab Results  Component Value Date   WBC 6.4 09/22/2018   HGB 12.2 09/22/2018   HCT 37.2 09/22/2018   MCV 95.1 09/22/2018   PLT 190 09/22/2018      Chemistry      Component Value Date/Time   NA 142 09/22/2018 1007   NA 141 09/08/2017 1009   K 3.5 09/22/2018 1007   K 3.5 09/08/2017 1009   CL 104 09/22/2018 1007   CO2 27 09/22/2018 1007   CO2 29 09/08/2017 1009   BUN 7 (L) 09/22/2018 1007   BUN 5.8 (L) 09/08/2017 1009   CREATININE 0.74 09/22/2018 1007   CREATININE 0.7 09/08/2017 1009      Component Value Date/Time   CALCIUM 9.3 09/22/2018 1007   CALCIUM 9.0 09/08/2017 1009   ALKPHOS 59 09/22/2018 1007   ALKPHOS 49 09/08/2017 1009   AST 10 (L) 09/22/2018 1007   AST 9 09/08/2017 1009   ALT 6 09/22/2018 1007   ALT <6 09/08/2017 1009   BILITOT 0.6 09/22/2018 1007   BILITOT 0.51 09/08/2017 1009       RADIOGRAPHIC STUDIES: No results found.  ASSESSMENT AND PLAN:  This is a very pleasant 62 years old white female was limited stage small cell lung cancer, status post 6 cycles of systemic chemotherapy with carboplatin and etoposide concurrent with radiation and followed by prophylactic cranial irradiation. Patient has been in observation for more than a year.  She has been doing fine but recently started complaining of increasing fatigue and weakness as well as headache. The patient had evidence for disease recurrence and she was a started on treatment with systemic chemotherapy with carboplatin, etoposide and Tecentriq.  She is status post 6 cycles.  The patient had no evidence for disease progression after the induction phase of her treatment.  She was started on maintenance treatment with Tecentriq Huey Bienenstock) status post 5 cycles. The patient continues to tolerate her treatment well. I recommended for her to proceed with cycle #6 today as scheduled. I will see her back for follow-up visit in 3 weeks for evaluation after repeating CT scan of the  chest, abdomen and pelvis for restaging of her disease. The patient was advised to call immediately if she has any concerning symptoms in the interval. The patient voices understanding of current disease status and treatment options and is in agreement with the current care plan. All questions were answered. The patient knows to call the clinic with any problems, questions or concerns. We can certainly see the patient much  sooner if necessary. I spent 10 minutes counseling the patient face to face. The total time spent in the appointment was 15 minutes.  Disclaimer: This note was dictated with voice recognition software. Similar sounding words can inadvertently be transcribed and may not be corrected upon review.

## 2018-10-13 NOTE — Telephone Encounter (Signed)
Scheduled appt per 11/12 los - pt to get an updated schedule next visit.

## 2018-10-13 NOTE — Patient Instructions (Signed)
[facility] Discharge Instructions for Patients Receiving Chemotherapy  Today you received the following chemotherapy agents: Atezolizumab Gildardo Pounds)  To help prevent nausea and vomiting after your treatment, we encourage you to take your nausea medication as directed.    If you develop nausea and vomiting that is not controlled by your nausea medication, call the clinic.   BELOW ARE SYMPTOMS THAT SHOULD BE REPORTED IMMEDIATELY:  *FEVER GREATER THAN 100.5 F  *CHILLS WITH OR WITHOUT FEVER  NAUSEA AND VOMITING THAT IS NOT CONTROLLED WITH YOUR NAUSEA MEDICATION  *UNUSUAL SHORTNESS OF BREATH  *UNUSUAL BRUISING OR BLEEDING  TENDERNESS IN MOUTH AND THROAT WITH OR WITHOUT PRESENCE OF ULCERS  *URINARY PROBLEMS  *BOWEL PROBLEMS  UNUSUAL RASH Items with * indicate a potential emergency and should be followed up as soon as possible.  Feel free to call the clinic should you have any questions or concerns. The clinic phone number is [phone].  Please show the [facility] at check-in to the Emergency Department and triage nurse.

## 2018-10-22 ENCOUNTER — Other Ambulatory Visit: Payer: Self-pay | Admitting: Medical Oncology

## 2018-10-22 ENCOUNTER — Telehealth: Payer: Self-pay | Admitting: Medical Oncology

## 2018-10-22 ENCOUNTER — Ambulatory Visit (HOSPITAL_COMMUNITY): Payer: Medicare Other

## 2018-10-22 DIAGNOSIS — R112 Nausea with vomiting, unspecified: Secondary | ICD-10-CM

## 2018-10-22 MED ORDER — ONDANSETRON 4 MG PO TBDP: ORAL_TABLET | ORAL | 1 refills | Status: DC

## 2018-10-22 NOTE — Telephone Encounter (Signed)
Refill requested-Zofran

## 2018-10-27 ENCOUNTER — Ambulatory Visit (HOSPITAL_COMMUNITY)
Admission: RE | Admit: 2018-10-27 | Discharge: 2018-10-27 | Disposition: A | Discharge status: Home / Self Care | Payer: Medicare Other | Source: Ambulatory Visit | Attending: Internal Medicine | Admitting: Internal Medicine

## 2018-10-27 DIAGNOSIS — C3412 Malignant neoplasm of upper lobe, left bronchus or lung: Secondary | ICD-10-CM | POA: Diagnosis not present

## 2018-10-27 DIAGNOSIS — C787 Secondary malignant neoplasm of liver and intrahepatic bile duct: Secondary | ICD-10-CM | POA: Diagnosis not present

## 2018-10-27 MED ORDER — IOPAMIDOL (ISOVUE-300) INJECTION 61%
100.0000 mL | Freq: Once | INTRAVENOUS | Status: AC | PRN
  Administered 2018-10-27: 100 mL via INTRAVENOUS

## 2018-10-31 DIAGNOSIS — R0902 Hypoxemia: Secondary | ICD-10-CM | POA: Diagnosis not present

## 2018-10-31 DIAGNOSIS — J449 Chronic obstructive pulmonary disease, unspecified: Secondary | ICD-10-CM | POA: Diagnosis not present

## 2018-11-01 ENCOUNTER — Other Ambulatory Visit: Payer: Self-pay

## 2018-11-01 ENCOUNTER — Emergency Department (HOSPITAL_COMMUNITY): Payer: Medicare Other

## 2018-11-01 ENCOUNTER — Emergency Department (HOSPITAL_COMMUNITY)
Admission: EM | Admit: 2018-11-01 | Discharge: 2018-11-01 | Disposition: A | Discharge status: Home / Self Care | Payer: Medicare Other | Attending: Emergency Medicine | Admitting: Emergency Medicine

## 2018-11-01 ENCOUNTER — Encounter (HOSPITAL_COMMUNITY): Payer: Self-pay

## 2018-11-01 DIAGNOSIS — R112 Nausea with vomiting, unspecified: Secondary | ICD-10-CM | POA: Diagnosis not present

## 2018-11-01 DIAGNOSIS — I10 Essential (primary) hypertension: Secondary | ICD-10-CM | POA: Diagnosis not present

## 2018-11-01 DIAGNOSIS — R197 Diarrhea, unspecified: Secondary | ICD-10-CM

## 2018-11-01 DIAGNOSIS — Z79899 Other long term (current) drug therapy: Secondary | ICD-10-CM | POA: Diagnosis not present

## 2018-11-01 DIAGNOSIS — R1114 Bilious vomiting: Secondary | ICD-10-CM | POA: Diagnosis not present

## 2018-11-01 DIAGNOSIS — R1032 Left lower quadrant pain: Secondary | ICD-10-CM | POA: Diagnosis not present

## 2018-11-01 DIAGNOSIS — Z87891 Personal history of nicotine dependence: Secondary | ICD-10-CM | POA: Insufficient documentation

## 2018-11-01 DIAGNOSIS — J449 Chronic obstructive pulmonary disease, unspecified: Secondary | ICD-10-CM | POA: Insufficient documentation

## 2018-11-01 DIAGNOSIS — R110 Nausea: Secondary | ICD-10-CM | POA: Diagnosis not present

## 2018-11-01 LAB — CBC WITH DIFFERENTIAL/PLATELET
Abs Immature Granulocytes: 0.03 10*3/uL (ref 0.00–0.07)
Basophils Absolute: 0 10*3/uL (ref 0.0–0.1)
Basophils Relative: 0 %
Eosinophils Absolute: 0.2 10*3/uL (ref 0.0–0.5)
Eosinophils Relative: 5 %
HCT: 37.7 % (ref 36.0–46.0)
Hemoglobin: 12.2 g/dL (ref 12.0–15.0)
Immature Granulocytes: 1 %
Lymphocytes Relative: 25 %
Lymphs Abs: 1.2 10*3/uL (ref 0.7–4.0)
MCH: 30.8 pg (ref 26.0–34.0)
MCHC: 32.4 g/dL (ref 30.0–36.0)
MCV: 95.2 fL (ref 80.0–100.0)
Monocytes Absolute: 0.5 10*3/uL (ref 0.1–1.0)
Monocytes Relative: 11 %
Neutro Abs: 2.9 10*3/uL (ref 1.7–7.7)
Neutrophils Relative %: 58 %
Platelets: 187 10*3/uL (ref 150–400)
RBC: 3.96 MIL/uL (ref 3.87–5.11)
RDW: 14.3 % (ref 11.5–15.5)
WBC: 5 10*3/uL (ref 4.0–10.5)
nRBC: 0 % (ref 0.0–0.2)

## 2018-11-01 LAB — BASIC METABOLIC PANEL
Anion gap: 7 (ref 5–15)
BUN: 7 mg/dL — ABNORMAL LOW (ref 8–23)
CO2: 27 mmol/L (ref 22–32)
Calcium: 8.7 mg/dL — ABNORMAL LOW (ref 8.9–10.3)
Chloride: 101 mmol/L (ref 98–111)
Creatinine, Ser: 0.62 mg/dL (ref 0.44–1.00)
GFR calc Af Amer: >60 mL/min (ref >60)
GFR calc non Af Amer: >60 mL/min (ref >60)
Glucose, Bld: 90 mg/dL (ref 70–99)
Potassium: 3.3 mmol/L — ABNORMAL LOW (ref 3.5–5.1)
Sodium: 135 mmol/L (ref 135–145)

## 2018-11-01 LAB — URINALYSIS, ROUTINE W REFLEX MICROSCOPIC
Bilirubin Urine: NEGATIVE
Glucose, UA: NEGATIVE mg/dL
Hgb urine dipstick: NEGATIVE
Ketones, ur: NEGATIVE mg/dL
Leukocytes, UA: NEGATIVE
Nitrite: NEGATIVE
Protein, ur: NEGATIVE mg/dL
Specific Gravity, Urine: 1.013 (ref 1.005–1.030)
pH: 6 (ref 5.0–8.0)

## 2018-11-01 MED ORDER — MORPHINE SULFATE (PF) 4 MG/ML IV SOLN
6.0000 mg | Freq: Once | INTRAVENOUS | Status: AC
  Administered 2018-11-01: 6 mg via INTRAVENOUS
  Filled 2018-11-01: qty 2

## 2018-11-01 MED ORDER — IOPAMIDOL (ISOVUE-300) INJECTION 61%
100.0000 mL | Freq: Once | INTRAVENOUS | Status: AC | PRN
  Administered 2018-11-01: 100 mL via INTRAVENOUS

## 2018-11-01 MED ORDER — PROMETHAZINE HCL 25 MG/ML IJ SOLN
25.0000 mg | Freq: Once | INTRAMUSCULAR | Status: AC
  Administered 2018-11-01: 25 mg via INTRAVENOUS
  Filled 2018-11-01: qty 1

## 2018-11-01 MED ORDER — PROMETHAZINE HCL 25 MG RE SUPP: 25.0000 mg | Freq: Four times a day (QID) | RECTAL | 0 refills | Status: DC | PRN

## 2018-11-01 MED ORDER — SODIUM CHLORIDE (PF) 0.9 % IJ SOLN
INTRAMUSCULAR | Status: AC
  Filled 2018-11-01: qty 50

## 2018-11-01 MED ORDER — SODIUM CHLORIDE 0.9 % IV BOLUS: 1000.0000 mL | Freq: Once | INTRAVENOUS | Status: DC

## 2018-11-01 MED ORDER — IOPAMIDOL (ISOVUE-300) INJECTION 61%
INTRAVENOUS | Status: AC
  Filled 2018-11-01: qty 100

## 2018-11-01 NOTE — ED Triage Notes (Signed)
Pt presents to ER complaining of nausea, vomiting, and diarrhea. Pt states Tuesday she had a PET scan r/t small cell lung cancer and had to drink oral contrast. Pt  since drinking the medication she has had constant nausea, vomiting, diarrhea, and abdominal pain. Pt was told that she was dehydrated and needed to drink lots of fluids, but she has been unable to due to the vomiting. Pt is alert and oriented. NAD noted. Skin is warm and dry. Respirations are regular even and unlabored. Pt wears 2L O2 as needed. Pt O2 sats are currently 94% on 2L [place]. Pt states that she had to use the O2 all night.

## 2018-11-01 NOTE — ED Provider Notes (Signed)
[facility] DEPT Provider Note   CSN: [id number] Arrival date & time: 11/01/18  1020     History   Chief Complaint Chief Complaint  Patient presents with  . Nausea    HPI Rebekah Henderson is a 62 y.o. female.  HPI Patient presents to the emergency department with nausea vomiting and diarrhea ever since Tuesday.  The patient states that she had a CT scan where she had to drink oral contrast on Tuesday and since that time she had nausea vomiting diarrhea.  The patient states she does have some left lower abdominal discomfort as well.  The patient denies chest pain, shortness of breath, headache,blurred vision, neck pain, fever, cough, weakness, numbness, dizziness, anorexia, edema,rash, back pain, dysuria, hematemesis, bloody stool, near syncope, or syncope. Past Medical History:  Diagnosis Date  . 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 Anxiety    takes Prozac daily  . Arthritis   . Benign fundic gland polyps of stomach   . Colon polyps   . COPD (chronic obstructive pulmonary disease) ([place])   . Dehydration 03/06/2017  . Diverticulitis   . Dyspnea    with exertion  . Encounter for antineoplastic chemotherapy 12/03/2016  . Fibromyalgia   . GERD (gastroesophageal reflux disease)    takes Pantoprazole daily  . Hypertension    takes Metoprolol,Triamterene-HCTZ,and Amlodipine daily  . Hypothyroidism    takes Synthroid daily  . IBS (irritable bowel syndrome)   . lung ca dx'd 10/02/2016   skin, lung  . PONV (postoperative nausea and vomiting)    pt also states that she had some difficulty breathing after cervical fusion    Patient Active Problem List   Diagnosis Date Noted  . Goals of care, counseling/discussion 06/30/2018  . Encounter for antineoplastic immunotherapy 03/10/2018  . 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 Acute on chronic respiratory failure with hypoxia ([place]) 03/21/2017  . Mild  protein-calorie malnutrition ([place]) 03/21/2017  . HCAP (healthcare-associated pneumonia) 03/20/2017  . Dehydration 03/06/2017  . Symptomatic anemia 01/04/2017  . Thrombocytopenia ([place]) 01/04/2017  . Influenza A 12/10/2016  . SIRS (systemic inflammatory response syndrome) ([place]) 12/07/2016  . Hypoxia 12/07/2016  . Bronchitis 12/07/2016  . Encounter for antineoplastic chemotherapy 12/03/2016  . 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 Protein-calorie malnutrition, severe 10/30/2016  . Vomiting 10/29/2016  . Abdominal pain 10/29/2016  . Rash and nonspecific skin eruption 10/29/2016  . Neutropenic fever ([place]) 10/15/2016  . Intractable nausea and vomiting 10/15/2016  . Pancytopenia due to antineoplastic chemotherapy ([place]) 10/15/2016  . Nodular rash 10/15/2016  . Hypokalemia 10/15/2016  . Hypomagnesemia 10/15/2016  . Chronic respiratory failure with hypoxia ([place]) 10/15/2016  . Small cell lung cancer, left ([place]) 09/19/2016  . Mediastinal mass 09/10/2016  . COPD (chronic obstructive pulmonary disease) ([place])   . Hypertension   . Fibromyalgia   . IBS (irritable bowel syndrome)   . GERD (gastroesophageal reflux disease)   . Hypothyroidism   . Colon polyps   . Anxiety   . PONV (postoperative nausea and vomiting)   . Diarrhea 05/12/2013    Past Surgical History:  Procedure Laterality Date  . BIOPSY N/A 05/25/2013   Procedure: BIOPSIES (Random Colon; Duodenal; Gastric);  Surgeon: Danie Binder, MD;  Location: [facility];  Service: Endoscopy;  Laterality: N/A;  . BLADDER SUSPENSION    . BREAST ENHANCEMENT SURGERY    . BREAST IMPLANT REMOVAL    . CERVICAL FUSION  AUG 2013  .  CHOLECYSTECTOMY  1999  . COLONOSCOPY  2007 [place]   POLYPS  . COLONOSCOPY WITH PROPOFOL N/A 05/25/2013   Procedure: COLONOSCOPY WITH PROPOFOL(at cecum 0957) total withdrawal time=11min);  Surgeon: Danie Binder, MD;  Location: [facility];  Service: Endoscopy;  Laterality: N/A;  . ESOPHAGOGASTRODUODENOSCOPY  (EGD) WITH PROPOFOL N/A 05/25/2013   Procedure: ESOPHAGOGASTRODUODENOSCOPY (EGD) WITH PROPOFOL;  Surgeon: Danie Binder, MD;  Location: [facility];  Service: Endoscopy;  Laterality: N/A;  . FOOT SURGERY    . IR FLUORO GUIDE PORT INSERTION RIGHT  03/04/2018  . IR US GUIDE VASC ACCESS RIGHT  03/04/2018  . POLYPECTOMY N/A 05/25/2013   Procedure: POLYPECTOMY (Rectal and Gastric);  Surgeon: Danie Binder, MD;  Location: [facility];  Service: Endoscopy;  Laterality: N/A;  . TONSILLECTOMY    . UPPER GASTROINTESTINAL ENDOSCOPY    . VIDEO BRONCHOSCOPY WITH ENDOBRONCHIAL ULTRASOUND  09/12/2016   Procedure: VIDEO BRONCHOSCOPY WITH ENDOBRONCHIAL ULTRASOUND AND BIOPSY;  Surgeon: Juanito Doom, MD;  Location: [facility] OR;  Service: Cardiopulmonary;;     OB History   None      Home Medications    Prior to Admission medications   Medication Sig Start Date End Date Taking? Authorizing Provider  albuterol (PROVENTIL HFA;VENTOLIN HFA) 108 (90 Base) MCG/ACT inhaler Inhale 2 puffs into the lungs every 4 (four) hours as needed for wheezing or shortness of breath.   Yes [provider]  ALPRAZolam (XANAX) 0.25 MG tablet Take 2 tablets (0.5 mg total) by mouth at bedtime as needed for anxiety. 02/11/18  Yes Curt Bears, MD  amLODipine (NORVASC) 5 MG tablet Take 5 mg by mouth daily.     Yes [provider]  dicyclomine (BENTYL) 20 MG tablet Take 20 mg by mouth 3 (three) times daily as needed for spasms.   Yes [provider]  estazolam (PROSOM) 2 MG tablet Take 2 mg by mouth at bedtime.   Yes [provider]  estradiol (ESTRACE) 2 MG tablet Take 2 mg by mouth daily.     Yes [provider]  FeFum-FePoly-FA-B Cmp-C-Biot (INTEGRA PLUS) CAPS TAKE 1 CAPSULE BY MOUTH ONCE DAILY IN THE MORNING. Patient taking differently: Take 1 capsule by mouth daily.  04/29/18  Yes Curt Bears, MD  FLUoxetine (PROZAC) 40 MG capsule Take 40 mg by mouth daily.    Yes [provider]    HYDROcodone-acetaminophen (NORCO/VICODIN) 5-325 MG tablet Take 1 tablet by mouth every 4 (four) hours as needed for moderate pain.    Yes [provider]  levothyroxine (SYNTHROID, LEVOTHROID) 50 MCG tablet Take 50 mcg by mouth daily before breakfast.    Yes [provider]  lidocaine-prilocaine (EMLA) cream Apply 1 application topically as needed. To numb skin over port a cath: Squeeze a  small amount on cotton ball and place over port site 1-2 hours prior to chemotherapy. 02/18/18  Yes Curt Bears, MD  loperamide (IMODIUM) 2 MG capsule Take 2 mg by mouth every 2 (two) hours as needed for diarrhea or loose stools.   Yes [provider]  ondansetron (ZOFRAN-ODT) 4 MG disintegrating tablet Dissolve one tablet by mouth every 8 hours as needed for nausea and vomiting, 10/22/18  Yes Curt Bears, MD  pantoprazole (PROTONIX) 40 MG tablet Take 40 mg by mouth 2 (two) times daily before a meal.    Yes [provider]  senna-docusate (SENOKOT-S) 8.6-50 MG tablet Take 1 tablet by mouth daily as needed. 07/17/12  Yes [provider]  metoprolol succinate (  TOPROL-XL) 25 MG 24 hr tablet Take 25 mg by mouth daily.    [provider]  promethazine (PHENERGAN) 25 MG tablet Take 1 tablet (25 mg total) by mouth every 6 (six) hours as needed. Patient not taking: Reported on 10/13/2018 02/11/18   Curt Bears, MD    Family History Family History  Problem Relation Age of Onset  . Breast cancer Mother   . Diabetes Maternal Grandfather   . Lung cancer Father   . Heart failure Sister 44       Died. Morbidly obese  . Colon cancer Neg Hx   . Colon polyps Neg Hx     Social History Social History   Tobacco Use  . Smoking status: Former Smoker    Packs/day: 2.00    Years: 20.00    Pack years: 40.00    Last attempt to quit: 05/20/2004    Years since quitting: 14.4  . Smokeless tobacco: Never Used  Substance Use Topics  . Alcohol use: No  . Drug  use: No     Allergies   Penicillins; Codeine; Fentanyl; Ranitidine hcl; Keflex [cephalexin]; and Lyrica [pregabalin]   Review of Systems Review of Systems  All other systems negative except as documented in the HPI. All pertinent positives and negatives as reviewed in the HPI. Physical Exam Updated Vital Signs BP (!) 111/97   Pulse 60   Temp 97.6 F (36.4 C) (Oral)   Resp 18   Ht 5\' 4"  (1.626 m)   Wt 70.3 kg   SpO2 94%   BMI 26.61 kg/m   Physical Exam  Constitutional: She is oriented to person, place, and time. She appears well-developed and well-nourished. No distress.  HENT:  Head: Normocephalic and atraumatic.  Mouth/Throat: Oropharynx is clear and moist.  Eyes: Pupils are equal, round, and reactive to light.  Neck: Normal range of motion. Neck supple.  Cardiovascular: Normal rate, regular rhythm and normal heart sounds. Exam reveals no gallop and no friction rub.  No murmur heard. Pulmonary/Chest: Effort normal and breath sounds normal. No respiratory distress. She has no wheezes.  Abdominal: Soft. Bowel sounds are normal. She exhibits no distension. There is no tenderness.  Neurological: She is alert and oriented to person, place, and time. She exhibits normal muscle tone. Coordination normal.  Skin: Skin is warm and dry. Capillary refill takes less than 2 seconds. No rash noted. No erythema.  Psychiatric: She has a normal mood and affect. Her behavior is normal.  Nursing note and vitals reviewed.    ED Treatments / Results  Labs (all labs ordered are listed, but only abnormal results are displayed) Labs Reviewed  BASIC METABOLIC PANEL - Abnormal; Notable for the following components:      Result Value   Potassium 3.3 (*)    BUN 7 (*)    Calcium 8.7 (*)    All other components within normal limits  URINALYSIS, ROUTINE W REFLEX MICROSCOPIC - Abnormal; Notable for the following components:   APPearance CLOUDY (*)    All other components within normal limits    CBC WITH DIFFERENTIAL/PLATELET    EKG None  Radiology Ct Abdomen Pelvis W Contrast  Result Date: 11/01/2018 CLINICAL DATA:  Nausea vomiting and diarrhea. EXAM: CT ABDOMEN AND PELVIS WITH CONTRAST TECHNIQUE: Multidetector CT imaging of the abdomen and pelvis was performed using the standard protocol following bolus administration of intravenous contrast. CONTRAST:  115mL ISOVUE-300 IOPAMIDOL (ISOVUE-300) INJECTION 61% COMPARISON:  Body CT 10/27/2018 FINDINGS: Lower chest: Patchy airspace consolidation  in the superior segment of the right upper lobe and basilar segments of the left lower lobe with an element of volume loss and bronchiectasis. Hepatobiliary: Known left lobe hepatic metastatic lesion measures 2.5 cm in greatest dimension. Post cholecystectomy. Pancreas: Unremarkable. No pancreatic ductal dilatation or surrounding inflammatory changes. Spleen: Normal in size without focal abnormality. Adrenals/Urinary Tract: Adrenal glands are unremarkable. Kidneys are without renal calculi, focal lesion, or hydronephrosis. Right renal cyst noted. Bladder is unremarkable. Stomach/Bowel: Stomach is within normal limits. Appendix appears normal. No evidence of bowel wall thickening, distention, or inflammatory changes. Stable periampullary duodenal diverticulum. Moderate left colonic diverticulosis without evidence of acute diverticulitis. Vascular/Lymphatic: Aortic atherosclerosis. No enlarged abdominal or pelvic lymph nodes. Reproductive: Status post hysterectomy. No adnexal masses. Other: No abdominal wall hernia or abnormality. No abdominopelvic ascites. Musculoskeletal: 2 cm L3 sclerotic metastatic deposit is stable in size. IMPRESSION: No acute findings within the abdomen or pelvis. Known left lobe of the liver metastatic lesion measures 2.5 cm, slightly larger than on the most recent prior CT, which may be within the realm of differences in measurement technique. Moderate left colonic diverticulosis  without evidence of acute diverticulitis. Stable L3 vertebral body sclerotic metastatic deposit. Persistent patchy peribronchial airspace opacities, atelectasis and bronchiectasis in the bilateral lower lobes. Electronically Signed   By: Fidela Salisbury M.D.   On: 11/01/2018 15:19    Procedures Procedures (including critical care time)  Medications Ordered in ED Medications  sodium chloride 0.9 % bolus 1,000 mL (0 mLs Intravenous Stopped 11/01/18 1150)  iopamidol (ISOVUE-300) 61 % injection (has no administration in time range)  sodium chloride (PF) 0.9 % injection (has no administration in time range)  morphine 4 MG/ML injection 6 mg (has no administration in time range)  promethazine (PHENERGAN) injection 25 mg (25 mg Intravenous Given 11/01/18 1149)  iopamidol (ISOVUE-300) 61 % injection 100 mL (100 mLs Intravenous Contrast Given 11/01/18 1417)     Initial Impression / Assessment and Plan / ED Course  I have reviewed the triage vital signs and the nursing notes.  Pertinent labs & imaging results that were available during my care of the patient were reviewed by me and considered in my medical decision making (see chart for details).     Patient had a CT scan that was negative for any diverticulitis which is her main concern with her left lower quadrant abdominal pain.  The patient has had normal vital signs here in the emergency department.  Patient is given some pain control here in the emergency department and will be advised to follow-up with her primary doctor.  Patient has been not vomiting here in the emergency department since she is got the Phenergan. Final Clinical Impressions(s) / ED Diagnoses   Final diagnoses:  None    ED Discharge Orders    None       Dalia Heading, PA-C 11/01/18 [place], [place], DO 11/01/18 1556

## 2018-11-01 NOTE — Discharge Instructions (Addendum)
Return here as needed.  Follow-up with your doctor for recheck.  Slowly increase your fluid intake.

## 2018-11-03 ENCOUNTER — Inpatient Hospital Stay: Payer: Medicare Other

## 2018-11-03 ENCOUNTER — Other Ambulatory Visit: Payer: Self-pay | Admitting: Medical Oncology

## 2018-11-03 ENCOUNTER — Encounter: Payer: Self-pay | Admitting: Internal Medicine

## 2018-11-03 ENCOUNTER — Inpatient Hospital Stay: Payer: Medicare Other | Attending: Internal Medicine | Admitting: Internal Medicine

## 2018-11-03 VITALS — BP 116/81 | HR 74 | Temp 97.4°F | Resp 17 | Ht 64.0 in | Wt 156.9 lb

## 2018-11-03 DIAGNOSIS — E039 Hypothyroidism, unspecified: Secondary | ICD-10-CM | POA: Diagnosis not present

## 2018-11-03 DIAGNOSIS — F419 Anxiety disorder, unspecified: Secondary | ICD-10-CM | POA: Diagnosis not present

## 2018-11-03 DIAGNOSIS — I10 Essential (primary) hypertension: Secondary | ICD-10-CM | POA: Insufficient documentation

## 2018-11-03 DIAGNOSIS — Z79899 Other long term (current) drug therapy: Secondary | ICD-10-CM | POA: Insufficient documentation

## 2018-11-03 DIAGNOSIS — J449 Chronic obstructive pulmonary disease, unspecified: Secondary | ICD-10-CM | POA: Insufficient documentation

## 2018-11-03 DIAGNOSIS — C3492 Malignant neoplasm of unspecified part of left bronchus or lung: Secondary | ICD-10-CM

## 2018-11-03 DIAGNOSIS — Z7189 Other specified counseling: Secondary | ICD-10-CM

## 2018-11-03 DIAGNOSIS — C7951 Secondary malignant neoplasm of bone: Secondary | ICD-10-CM | POA: Diagnosis not present

## 2018-11-03 DIAGNOSIS — D6481 Anemia due to antineoplastic chemotherapy: Secondary | ICD-10-CM | POA: Diagnosis not present

## 2018-11-03 DIAGNOSIS — R110 Nausea: Secondary | ICD-10-CM | POA: Diagnosis not present

## 2018-11-03 DIAGNOSIS — C787 Secondary malignant neoplasm of liver and intrahepatic bile duct: Secondary | ICD-10-CM | POA: Insufficient documentation

## 2018-11-03 DIAGNOSIS — R112 Nausea with vomiting, unspecified: Secondary | ICD-10-CM

## 2018-11-03 DIAGNOSIS — E860 Dehydration: Secondary | ICD-10-CM

## 2018-11-03 DIAGNOSIS — R5383 Other fatigue: Secondary | ICD-10-CM | POA: Diagnosis not present

## 2018-11-03 DIAGNOSIS — Z5111 Encounter for antineoplastic chemotherapy: Secondary | ICD-10-CM | POA: Insufficient documentation

## 2018-11-03 DIAGNOSIS — C3412 Malignant neoplasm of upper lobe, left bronchus or lung: Secondary | ICD-10-CM | POA: Insufficient documentation

## 2018-11-03 DIAGNOSIS — T451X5A Adverse effect of antineoplastic and immunosuppressive drugs, initial encounter: Secondary | ICD-10-CM

## 2018-11-03 DIAGNOSIS — R197 Diarrhea, unspecified: Secondary | ICD-10-CM | POA: Insufficient documentation

## 2018-11-03 LAB — CBC WITH DIFFERENTIAL (CANCER CENTER ONLY)
ABS IMMATURE GRANULOCYTES: 0.04 10*3/uL (ref 0.00–0.07)
Basophils Absolute: 0 10*3/uL (ref 0.0–0.1)
Basophils Relative: 0 %
EOS ABS: 0.2 10*3/uL (ref 0.0–0.5)
Eosinophils Relative: 3 %
HCT: 36.9 % (ref 36.0–46.0)
Hemoglobin: 12.1 g/dL (ref 12.0–15.0)
IMMATURE GRANULOCYTES: 1 %
Lymphocytes Relative: 10 %
Lymphs Abs: 0.8 10*3/uL (ref 0.7–4.0)
MCH: 30.1 pg (ref 26.0–34.0)
MCHC: 32.8 g/dL (ref 30.0–36.0)
MCV: 91.8 fL (ref 80.0–100.0)
Monocytes Absolute: 0.7 10*3/uL (ref 0.1–1.0)
Monocytes Relative: 9 %
NEUTROS ABS: 6.3 10*3/uL (ref 1.7–7.7)
NEUTROS PCT: 77 %
PLATELETS: 205 10*3/uL (ref 150–400)
RBC: 4.02 MIL/uL (ref 3.87–5.11)
RDW: 14.4 % (ref 11.5–15.5)
WBC: 8 10*3/uL (ref 4.0–10.5)
nRBC: 0 % (ref 0.0–0.2)

## 2018-11-03 LAB — CMP (CANCER CENTER ONLY)
AST: 11 U/L — AB (ref 15–41)
Albumin: 3.4 g/dL — ABNORMAL LOW (ref 3.5–5.0)
Alkaline Phosphatase: 49 U/L (ref 38–126)
Anion gap: 12 (ref 5–15)
BUN: 6 mg/dL — ABNORMAL LOW (ref 8–23)
CO2: 25 mmol/L (ref 22–32)
CREATININE: 0.8 mg/dL (ref 0.44–1.00)
Calcium: 9 mg/dL (ref 8.9–10.3)
Chloride: 103 mmol/L (ref 98–111)
Glucose, Bld: 96 mg/dL (ref 70–99)
POTASSIUM: 3.2 mmol/L — AB (ref 3.5–5.1)
SODIUM: 140 mmol/L (ref 135–145)
Total Bilirubin: 0.7 mg/dL (ref 0.3–1.2)
Total Protein: 6.9 g/dL (ref 6.5–8.1)

## 2018-11-03 LAB — TSH: TSH: 2.815 u[IU]/mL (ref 0.308–3.960)

## 2018-11-03 MED ORDER — PROMETHAZINE HCL 25 MG PO TABS: 25.0000 mg | ORAL_TABLET | Freq: Four times a day (QID) | ORAL | 0 refills | Status: DC | PRN

## 2018-11-03 MED ORDER — METHYLPREDNISOLONE 4 MG PO TBPK: ORAL_TABLET | ORAL | 0 refills | Status: DC

## 2018-11-03 MED ORDER — ONDANSETRON 4 MG PO TBDP: ORAL_TABLET | ORAL | 1 refills | Status: DC

## 2018-11-03 MED ORDER — SODIUM CHLORIDE 0.9% FLUSH
10.0000 mL | INTRAVENOUS | Status: DC | PRN
  Administered 2018-11-03: 10 mL via INTRAVENOUS
  Filled 2018-11-03: qty 10

## 2018-11-03 MED ORDER — HEPARIN SOD (PORK) LOCK FLUSH 100 UNIT/ML IV SOLN
500.0000 [IU] | Freq: Once | INTRAVENOUS | Status: AC
  Administered 2018-11-03: 500 [IU] via INTRAVENOUS
  Filled 2018-11-03: qty 5

## 2018-11-03 MED ORDER — SODIUM CHLORIDE 0.9% FLUSH
10.0000 mL | INTRAVENOUS | Status: DC | PRN
  Administered 2018-11-03: 10 mL
  Filled 2018-11-03: qty 10

## 2018-11-03 NOTE — Progress Notes (Signed)
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     Cycles 1 through 4, every 21 days:     Atezolizumab      Carboplatin      Etoposide    Cycles 5 and beyond, every 21 days:     Atezolizumab   **Always confirm dose/schedule in your pharmacy ordering system**  REASON: Disease Progression PRIOR TREATMENT: [id number]: Atezolizumab 1200 mg D1 + Carboplatin AUC=5 D1 + Etoposide 100 mg/m2 D1-3 q21 Days x 4 Cycles, Followed by Atezolizumab 1200 mg Maintenance Until Progression or Unacceptable Toxicity TREATMENT RESPONSE: Progressive Disease (PD)  START OFF PATHWAY REGIMEN - Small Cell Lung   [id number]:Cisplatin + Irinotecan:   A cycle is every 21 days:     Irinotecan      Cisplatin   **Always confirm dose/schedule in your pharmacy ordering system**  Patient Characteristics: Relapsed or Progressive Disease, Second Line, Relapse < 3 Months Therapeutic Status: Relapsed or Progressive Disease Line of Therapy: Second Line Time to Relapse: Relapse < 3 Months Intent of Therapy: Non-Curative / Palliative Intent, Discussed with Patient

## 2018-11-03 NOTE — Progress Notes (Signed)
[facility] [phone]   [phone]  OFFICE PROGRESS NOTE  Redmond School, MD [address] [place] [address]  DIAGNOSIS: Metastatic small cell lung cancer initially diagnosed as Limited stage (T1b, N2, M0) small cell lung cancer presented with left lower lobe/infrahilar mass and large mediastinal lymphadenopathy diagnosed in October 2017.  PRIOR THERAPY: 1) Systemic chemotherapy with cisplatin 60 MG/M2 on day 1 and etoposide 120 MG/M2 on days 1, 2 and 3 status post 1 cycle. This was discontinued secondary to intolerance. 2) Systemic chemotherapy with carboplatin for AUC of 4 on day 1 and etoposide 100 MG/M2 on days 1, 2 and 3 with Neulasta support on day 4 every 3 weeks. Status post 5 cycles. This was concurrent with radiation in [place], [place]. 3) prophylactic cranial irradiation. 4) Systemic chemotherapy with carboplatin for AUC of 5 on day 1, etoposide 100 mg/M2 on days 1, 2 and 3 as well as a Tecentriq (Atezolizumab) 1200 mg IV every 3 weeks with Neulasta support.  First dose February 17, 2018.  Status post 6 cycles of partial response. 5) Maintenance treatment with immunotherapy with Tecentriq 1200 mg IV every 3 weeks.  First dose June 30, 2018.  Status post 6 cycles.  Last dose was given October 13, 2018 discontinued secondary to disease progression.   CURRENT THERAPY: Systemic chemotherapy with cisplatin 30 mg/M2 and irinotecan 65 mg/M2 on days 1 and 8 every 3 weeks.  First dose November 10, 2018.  INTERVAL HISTORY: Rebekah Henderson 62 y.o. female returns to the clinic today for follow-up visit accompanied by a friend.  The patient is feeling fine today with no specific complaints except for several episodes of nausea and diarrhea last week.  She tried Imodium for few times with no improvement.  Her diarrhea got worse after drinking the oral contrast for the CT scan.  She has no chest pain, shortness of breath, cough or hemoptysis.   She has no fever or chills.  She lost around 8 pounds since her last visit.  The patient had repeat CT scan of the chest, abdomen and pelvis performed recently and she is here for evaluation and discussion of her risk her results.  MEDICAL HISTORY: Past Medical History:  Diagnosis Date  . 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 Anxiety    takes Prozac daily  . Arthritis   . Benign fundic gland polyps of stomach   . Colon polyps   . COPD (chronic obstructive pulmonary disease) ([facility])   . Dehydration 03/06/2017  . Diverticulitis   . Dyspnea    with exertion  . Encounter for antineoplastic chemotherapy 12/03/2016  . Fibromyalgia   . GERD (gastroesophageal reflux disease)    takes Pantoprazole daily  . Hypertension    takes Metoprolol,Triamterene-HCTZ,and Amlodipine daily  . Hypothyroidism    takes Synthroid daily  . IBS (irritable bowel syndrome)   . lung ca dx'd 10/02/2016   skin, lung  . PONV (postoperative nausea and vomiting)    pt also states that she had some difficulty breathing after cervical fusion    ALLERGIES:  is allergic to penicillins; codeine; fentanyl; ranitidine hcl; keflex [cephalexin]; and lyrica [pregabalin].  MEDICATIONS:  Current Outpatient Medications  Medication Sig Dispense Refill  . albuterol (PROVENTIL HFA;VENTOLIN HFA) 108 (90 Base) MCG/ACT inhaler Inhale 2 puffs into the lungs every 4 (four) hours as needed for wheezing or shortness of breath.    . ALPRAZolam (XANAX) 0.25 MG tablet Take 2 tablets (  0.5 mg total) by mouth at bedtime as needed for anxiety. 30 tablet 0  . amLODipine (NORVASC) 5 MG tablet Take 5 mg by mouth daily.      Marland Kitchen dicyclomine (BENTYL) 20 MG tablet Take 20 mg by mouth 3 (three) times daily as needed for spasms.    Marland Kitchen estazolam (PROSOM) 2 MG tablet Take 2 mg by mouth at bedtime.    Marland Kitchen estradiol (ESTRACE) 2 MG tablet Take 2 mg by mouth daily.      Marland Kitchen FeFum-FePoly-FA-B Cmp-C-Biot (INTEGRA PLUS) CAPS TAKE 1 CAPSULE BY MOUTH ONCE  DAILY IN THE MORNING. (Patient taking differently: Take 1 capsule by mouth daily. ) 30 capsule 0  . FLUoxetine (PROZAC) 40 MG capsule Take 40 mg by mouth daily.   2  . HYDROcodone-acetaminophen (NORCO/VICODIN) 5-325 MG tablet Take 1 tablet by mouth every 4 (four) hours as needed for moderate pain.   0  . levothyroxine (SYNTHROID, LEVOTHROID) 50 MCG tablet Take 50 mcg by mouth daily before breakfast.     . lidocaine-prilocaine (EMLA) cream Apply 1 application topically as needed. To numb skin over port a cath: Squeeze a  small amount on cotton ball and place over port site 1-2 hours prior to chemotherapy. 30 g 0  . loperamide (IMODIUM) 2 MG capsule Take 2 mg by mouth every 2 (two) hours as needed for diarrhea or loose stools.    . metoprolol succinate (TOPROL-XL) 25 MG 24 hr tablet Take 25 mg by mouth daily.    . ondansetron (ZOFRAN-ODT) 4 MG disintegrating tablet Dissolve one tablet by mouth every 8 hours as needed for nausea and vomiting, 20 tablet 1  . pantoprazole (PROTONIX) 40 MG tablet Take 40 mg by mouth 2 (two) times daily before a meal.   10  . promethazine (PHENERGAN) 25 MG suppository Place 1 suppository (25 mg total) rectally every 6 (six) hours as needed for nausea or vomiting. 10 suppository 0  . promethazine (PHENERGAN) 25 MG tablet Take 1 tablet (25 mg total) by mouth every 6 (six) hours as needed. (Patient not taking: Reported on 10/13/2018) 30 tablet 10  . senna-docusate (SENOKOT-S) 8.6-50 MG tablet Take 1 tablet by mouth daily as needed.     No current facility-administered medications for this visit.     SURGICAL HISTORY:  Past Surgical History:  Procedure Laterality Date  . BIOPSY N/A 05/25/2013   Procedure: BIOPSIES (Random Colon; Duodenal; Gastric);  Surgeon: Danie Binder, MD;  Location: [facility];  Service: Endoscopy;  Laterality: N/A;  . BLADDER SUSPENSION    . BREAST ENHANCEMENT SURGERY    . BREAST IMPLANT REMOVAL    . CERVICAL FUSION  AUG 2013  . CHOLECYSTECTOMY  1999   . COLONOSCOPY  2007 [place]   POLYPS  . COLONOSCOPY WITH PROPOFOL N/A 05/25/2013   Procedure: COLONOSCOPY WITH PROPOFOL(at cecum 0957) total withdrawal time=59min);  Surgeon: Danie Binder, MD;  Location: [facility];  Service: Endoscopy;  Laterality: N/A;  . ESOPHAGOGASTRODUODENOSCOPY (EGD) WITH PROPOFOL N/A 05/25/2013   Procedure: ESOPHAGOGASTRODUODENOSCOPY (EGD) WITH PROPOFOL;  Surgeon: Danie Binder, MD;  Location: [facility];  Service: Endoscopy;  Laterality: N/A;  . FOOT SURGERY    . IR FLUORO GUIDE PORT INSERTION RIGHT  03/04/2018  . IR US GUIDE VASC ACCESS RIGHT  03/04/2018  . POLYPECTOMY N/A 05/25/2013   Procedure: POLYPECTOMY (Rectal and Gastric);  Surgeon: Danie Binder, MD;  Location: [facility];  Service: Endoscopy;  Laterality: N/A;  . TONSILLECTOMY    . UPPER  GASTROINTESTINAL ENDOSCOPY    . VIDEO BRONCHOSCOPY WITH ENDOBRONCHIAL ULTRASOUND  09/12/2016   Procedure: VIDEO BRONCHOSCOPY WITH ENDOBRONCHIAL ULTRASOUND AND BIOPSY;  Surgeon: Juanito Doom, MD;  Location: [facility];  Service: Cardiopulmonary;;    REVIEW OF SYSTEMS:  Constitutional: positive for fatigue and weight loss Eyes: negative Ears, nose, mouth, throat, and face: negative Respiratory: negative Cardiovascular: negative Gastrointestinal: positive for diarrhea and nausea Genitourinary:negative Integument/breast: negative Hematologic/lymphatic: negative Musculoskeletal:negative Neurological: negative Behavioral/Psych: negative Endocrine: negative Allergic/Immunologic: negative   PHYSICAL EXAMINATION: General appearance: alert, cooperative, fatigued and no distress Head: Normocephalic, without obvious abnormality, atraumatic Neck: no adenopathy, no JVD, supple, symmetrical, trachea midline and thyroid not enlarged, symmetric, no tenderness/mass/nodules Lymph nodes: Cervical, supraclavicular, and axillary nodes normal. Resp: clear to auscultation bilaterally Back: symmetric, no curvature. ROM normal. No CVA  tenderness. Cardio: regular rate and rhythm, S1, S2 normal, no murmur, click, rub or gallop GI: soft, non-tender; bowel sounds normal; no masses,  no organomegaly Extremities: extremities normal, atraumatic, no cyanosis or edema Neurologic: Alert and oriented X 3, normal strength and tone. Normal symmetric reflexes. Normal coordination and gait   ECOG PERFORMANCE STATUS: 1 - Symptomatic but completely ambulatory  Blood pressure 116/81, pulse 74, temperature (!) 97.4 F (36.3 C), temperature source Oral, resp. rate 17, height 5\' 4"  (1.626 m), weight 156 lb 14.4 oz (71.2 kg), SpO2 (!) 84 %.  LABORATORY DATA: Lab Results  Component Value Date   WBC 5.0 11/01/2018   HGB 12.2 11/01/2018   HCT 37.7 11/01/2018   MCV 95.2 11/01/2018   PLT 187 11/01/2018      Chemistry      Component Value Date/Time   NA 135 11/01/2018 1136   NA 141 09/08/2017 1009   K 3.3 (L) 11/01/2018 1136   K 3.5 09/08/2017 1009   CL 101 11/01/2018 1136   CO2 27 11/01/2018 1136   CO2 29 09/08/2017 1009   BUN 7 (L) 11/01/2018 1136   BUN 5.8 (L) 09/08/2017 1009   CREATININE 0.62 11/01/2018 1136   CREATININE 0.72 10/13/2018 0753   CREATININE 0.7 09/08/2017 1009      Component Value Date/Time   CALCIUM 8.7 (L) 11/01/2018 1136   CALCIUM 9.0 09/08/2017 1009   ALKPHOS 53 10/13/2018 0753   ALKPHOS 49 09/08/2017 1009   AST 11 (L) 10/13/2018 0753   AST 9 09/08/2017 1009   ALT <6 10/13/2018 0753   ALT <6 09/08/2017 1009   BILITOT 0.6 10/13/2018 0753   BILITOT 0.51 09/08/2017 1009       RADIOGRAPHIC STUDIES: Ct Chest W Contrast  Result Date: 10/27/2018 CLINICAL DATA:  Small cell left lung cancer, initially diagnosed as limited stage in October 2017 and treated with concurrent chemoradiation therapy. Recurrent metastatic disease to the chest, liver, spleen and lumbar spine identified March 2019. Current maintenance immunotherapy. Restaging. EXAM: CT CHEST, ABDOMEN, AND PELVIS WITH CONTRAST TECHNIQUE:  Multidetector CT imaging of the chest, abdomen and pelvis was performed following the standard protocol during bolus administration of intravenous contrast. CONTRAST:  150mL ISOVUE-300 IOPAMIDOL (ISOVUE-300) INJECTION 61% COMPARISON:  08/26/2018 CT chest, abdomen and pelvis. FINDINGS: CT CHEST FINDINGS Cardiovascular: Top-normal heart size. Stable trace pericardial effusion/thickening. Atherosclerotic nonaneurysmal thoracic aorta. Stable top-normal caliber main pulmonary artery. No central pulmonary emboli. Mediastinum/Nodes: No discrete thyroid nodules. Unremarkable esophagus. No axillary adenopathy. Infiltrative AP window 3.8 x 2.8 cm mass (series 2/image 17), increased from 2.5 x 2.2 cm on 08/26/2018 CT. No additional pathologically enlarged mediastinal or hilar nodes. Lungs/Pleura: No pneumothorax. No pleural  effusion. Sharply marginated left perihilar consolidation with associated volume loss, bronchiectasis and distortion, stable, compatible with radiation fibrosis. Previously described patchy areas of ground-glass opacity and consolidation in the bilateral lower lungs have largely resolved. However, there are new extensive patchy irregular foci of consolidation and ground-glass attenuation throughout the right middle and right upper lobes. No new significant pulmonary nodules. Musculoskeletal: No aggressive appearing focal osseous lesions. Partially visualized surgical hardware from ACDF in the lower cervical spine. Moderate thoracic spondylosis. CT ABDOMEN PELVIS FINDINGS Hepatobiliary: Hypodense 2.2 x 1.5 cm posterior left liver lobe lesion (series 2/image 54), increased from 1.6 x 1.0 cm using similar measurement technique. No additional liver lesions. Cholecystectomy. Stable mild central intrahepatic biliary ductal dilatation and CBD diameter 10 mm with smooth distal tapering, compatible with chronic post cholecystectomy effect. Stable periampullary duodenal diverticulum. Pancreas: Normal, with no mass or  duct dilation. Spleen: Normal size. No mass. Adrenals/Urinary Tract: Normal adrenals. Simple 1.2 cm upper right renal cyst. Otherwise normal kidneys, with no hydronephrosis. Normal bladder. Stomach/Bowel: Stable small to moderate hiatal hernia. Otherwise normal nondistended stomach. Normal caliber small bowel with no small bowel wall thickening. Normal appendix. Oral contrast transits to the rectum. Moderate diffuse colonic diverticulosis, with no definite large bowel wall thickening or acute pericolonic fat stranding. Vascular/Lymphatic: Atherosclerotic nonaneurysmal abdominal aorta. Patent portal, splenic, hepatic and renal veins. No pathologically enlarged lymph nodes in the abdomen or pelvis. Reproductive: Status post hysterectomy, with no abnormal findings at the vaginal cuff. No adnexal mass. Other: No pneumoperitoneum, ascites or focal fluid collection. Musculoskeletal: Stable sclerotic 2.0 cm L3 vertebral body lesion. No new focal osseous lesions. Moderate lumbar spondylosis. IMPRESSION: 1. Interval growth of AP window metastatic nodal mass. Mild growth of left liver lobe metastasis. No new sites of metastatic disease. Stable sclerotic L3 vertebral osseous metastasis. 2. Waxing and waning pulmonary opacities, with extensive new patchy irregular foci of consolidation throughout the mid to upper right lung. The imaging appearance and behavior is most compatible with cryptogenic organizing pneumonia, potentially on the basis of pulmonary toxicity from immunotherapy. 3.  Aortic Atherosclerosis (ICD10-I70.0). Electronically Signed   By: Ilona Sorrel M.D.   On: 10/27/2018 16:35   Ct Abdomen Pelvis W Contrast  Result Date: 11/01/2018 CLINICAL DATA:  Nausea vomiting and diarrhea. EXAM: CT ABDOMEN AND PELVIS WITH CONTRAST TECHNIQUE: Multidetector CT imaging of the abdomen and pelvis was performed using the standard protocol following bolus administration of intravenous contrast. CONTRAST:  133mL ISOVUE-300  IOPAMIDOL (ISOVUE-300) INJECTION 61% COMPARISON:  Body CT 10/27/2018 FINDINGS: Lower chest: Patchy airspace consolidation in the superior segment of the right upper lobe and basilar segments of the left lower lobe with an element of volume loss and bronchiectasis. Hepatobiliary: Known left lobe hepatic metastatic lesion measures 2.5 cm in greatest dimension. Post cholecystectomy. Pancreas: Unremarkable. No pancreatic ductal dilatation or surrounding inflammatory changes. Spleen: Normal in size without focal abnormality. Adrenals/Urinary Tract: Adrenal glands are unremarkable. Kidneys are without renal calculi, focal lesion, or hydronephrosis. Right renal cyst noted. Bladder is unremarkable. Stomach/Bowel: Stomach is within normal limits. Appendix appears normal. No evidence of bowel wall thickening, distention, or inflammatory changes. Stable periampullary duodenal diverticulum. Moderate left colonic diverticulosis without evidence of acute diverticulitis. Vascular/Lymphatic: Aortic atherosclerosis. No enlarged abdominal or pelvic lymph nodes. Reproductive: Status post hysterectomy. No adnexal masses. Other: No abdominal wall hernia or abnormality. No abdominopelvic ascites. Musculoskeletal: 2 cm L3 sclerotic metastatic deposit is stable in size. IMPRESSION: No acute findings within the abdomen or pelvis. Known left lobe of the  liver metastatic lesion measures 2.5 cm, slightly larger than on the most recent prior CT, which may be within the realm of differences in measurement technique. Moderate left colonic diverticulosis without evidence of acute diverticulitis. Stable L3 vertebral body sclerotic metastatic deposit. Persistent patchy peribronchial airspace opacities, atelectasis and bronchiectasis in the bilateral lower lobes. Electronically Signed   By: Fidela Salisbury M.D.   On: 11/01/2018 15:19   Ct Abdomen Pelvis W Contrast  Result Date: 10/27/2018 CLINICAL DATA:  Small cell left lung cancer,  initially diagnosed as limited stage in October 2017 and treated with concurrent chemoradiation therapy. Recurrent metastatic disease to the chest, liver, spleen and lumbar spine identified March 2019. Current maintenance immunotherapy. Restaging. EXAM: CT CHEST, ABDOMEN, AND PELVIS WITH CONTRAST TECHNIQUE: Multidetector CT imaging of the chest, abdomen and pelvis was performed following the standard protocol during bolus administration of intravenous contrast. CONTRAST:  163mL ISOVUE-300 IOPAMIDOL (ISOVUE-300) INJECTION 61% COMPARISON:  08/26/2018 CT chest, abdomen and pelvis. FINDINGS: CT CHEST FINDINGS Cardiovascular: Top-normal heart size. Stable trace pericardial effusion/thickening. Atherosclerotic nonaneurysmal thoracic aorta. Stable top-normal caliber main pulmonary artery. No central pulmonary emboli. Mediastinum/Nodes: No discrete thyroid nodules. Unremarkable esophagus. No axillary adenopathy. Infiltrative AP window 3.8 x 2.8 cm mass (series 2/image 17), increased from 2.5 x 2.2 cm on 08/26/2018 CT. No additional pathologically enlarged mediastinal or hilar nodes. Lungs/Pleura: No pneumothorax. No pleural effusion. Sharply marginated left perihilar consolidation with associated volume loss, bronchiectasis and distortion, stable, compatible with radiation fibrosis. Previously described patchy areas of ground-glass opacity and consolidation in the bilateral lower lungs have largely resolved. However, there are new extensive patchy irregular foci of consolidation and ground-glass attenuation throughout the right middle and right upper lobes. No new significant pulmonary nodules. Musculoskeletal: No aggressive appearing focal osseous lesions. Partially visualized surgical hardware from ACDF in the lower cervical spine. Moderate thoracic spondylosis. CT ABDOMEN PELVIS FINDINGS Hepatobiliary: Hypodense 2.2 x 1.5 cm posterior left liver lobe lesion (series 2/image 54), increased from 1.6 x 1.0 cm using similar  measurement technique. No additional liver lesions. Cholecystectomy. Stable mild central intrahepatic biliary ductal dilatation and CBD diameter 10 mm with smooth distal tapering, compatible with chronic post cholecystectomy effect. Stable periampullary duodenal diverticulum. Pancreas: Normal, with no mass or duct dilation. Spleen: Normal size. No mass. Adrenals/Urinary Tract: Normal adrenals. Simple 1.2 cm upper right renal cyst. Otherwise normal kidneys, with no hydronephrosis. Normal bladder. Stomach/Bowel: Stable small to moderate hiatal hernia. Otherwise normal nondistended stomach. Normal caliber small bowel with no small bowel wall thickening. Normal appendix. Oral contrast transits to the rectum. Moderate diffuse colonic diverticulosis, with no definite large bowel wall thickening or acute pericolonic fat stranding. Vascular/Lymphatic: Atherosclerotic nonaneurysmal abdominal aorta. Patent portal, splenic, hepatic and renal veins. No pathologically enlarged lymph nodes in the abdomen or pelvis. Reproductive: Status post hysterectomy, with no abnormal findings at the vaginal cuff. No adnexal mass. Other: No pneumoperitoneum, ascites or focal fluid collection. Musculoskeletal: Stable sclerotic 2.0 cm L3 vertebral body lesion. No new focal osseous lesions. Moderate lumbar spondylosis. IMPRESSION: 1. Interval growth of AP window metastatic nodal mass. Mild growth of left liver lobe metastasis. No new sites of metastatic disease. Stable sclerotic L3 vertebral osseous metastasis. 2. Waxing and waning pulmonary opacities, with extensive new patchy irregular foci of consolidation throughout the mid to upper right lung. The imaging appearance and behavior is most compatible with cryptogenic organizing pneumonia, potentially on the basis of pulmonary toxicity from immunotherapy. 3.  Aortic Atherosclerosis (ICD10-I70.0). Electronically Signed   By: Rinaldo Ratel  Poff M.D.   On: 10/27/2018 16:35    ASSESSMENT AND PLAN:   This is a very pleasant 62 years old white female was limited stage small cell lung cancer, status post 6 cycles of systemic chemotherapy with carboplatin and etoposide concurrent with radiation and followed by prophylactic cranial irradiation. Patient has been in observation for more than a year.  She has been doing fine but recently started complaining of increasing fatigue and weakness as well as headache. The patient had evidence for disease recurrence and she was a started on treatment with systemic chemotherapy with carboplatin, etoposide and Tecentriq.  She is status post 6 cycles.  The patient had no evidence for disease progression after the induction phase of her treatment.  She was started on maintenance treatment with Tecentriq Huey Bienenstock) status post 6 cycles.  She has been tolerating this treatment well.   She had repeat CT scan of the chest, abdomen and pelvis performed recently.  I personally and independently reviewed the scan images and discussed the results with the patient and her friend today. Unfortunately the CT scan showed evidence for disease progression with enlargement of AP window lymph node as well as liver lesion. I discussed with the patient several options for management of her condition including palliative care and hospice referral versus consideration of palliative radiotherapy to the AP window lymph node and close monitoring of the liver lesion with continuation of the immunotherapy versus discontinuation of the immunotherapy and considering the patient for systemic chemotherapy with a reduced dose cisplatin 30 mg/M2 and irinotecan 65 mg/M2 on days 1 and 8 every 3 weeks.  The patient is interested in proceeding with the systemic chemotherapy and she is expected to start the first dose of this treatment next week. I discussed with the patient the adverse effect of this treatment including but not limited to alopecia, myelosuppression, nausea and vomiting, peripheral  neuropathy, liver or renal dysfunction. The patient will come back for follow-up visit in 4 weeks for evaluation before starting cycle #2. For the diarrhea, I will start the patient on Medrol Dosepak to see if it would help with any suspicious immunotherapy mediated colitis and if needed we will consider The patient for higher dose of steroid.  She was also advised to continue on Imodium for now. She was advised to call immediately if she has any concerning symptoms in the interval.  The patient voices understanding of current disease status and treatment options and is in agreement with the current care plan. All questions were answered. The patient knows to call the clinic with any problems, questions or concerns. We can certainly see the patient much sooner if necessary.   Disclaimer: This note was dictated with voice recognition software. Similar sounding words can inadvertently be transcribed and may not be corrected upon review.

## 2018-11-06 ENCOUNTER — Telehealth: Payer: Self-pay | Admitting: Internal Medicine

## 2018-11-06 NOTE — Telephone Encounter (Signed)
Scheduled per 12/3 los - pt aware of appts

## 2018-11-09 ENCOUNTER — Telehealth: Payer: Self-pay | Admitting: Internal Medicine

## 2018-11-09 NOTE — Telephone Encounter (Signed)
R/s apt per patient request - unable to come in 12/10 - left message for patient that appt was rescheduled to 12/11

## 2018-11-10 ENCOUNTER — Inpatient Hospital Stay (HOSPITAL_BASED_OUTPATIENT_CLINIC_OR_DEPARTMENT_OTHER): Payer: Medicare Other

## 2018-11-10 ENCOUNTER — Inpatient Hospital Stay: Payer: Medicare Other

## 2018-11-10 ENCOUNTER — Encounter: Payer: Self-pay | Admitting: Internal Medicine

## 2018-11-10 ENCOUNTER — Other Ambulatory Visit: Payer: Medicare Other

## 2018-11-10 ENCOUNTER — Telehealth: Payer: Self-pay | Admitting: Medical Oncology

## 2018-11-10 ENCOUNTER — Telehealth: Payer: Self-pay | Admitting: Internal Medicine

## 2018-11-10 VITALS — BP 110/75 | HR 63 | Temp 97.6°F | Resp 16 | Ht 64.0 in | Wt 157.8 lb

## 2018-11-10 DIAGNOSIS — E860 Dehydration: Secondary | ICD-10-CM

## 2018-11-10 DIAGNOSIS — C3412 Malignant neoplasm of upper lobe, left bronchus or lung: Secondary | ICD-10-CM | POA: Diagnosis not present

## 2018-11-10 DIAGNOSIS — R112 Nausea with vomiting, unspecified: Secondary | ICD-10-CM

## 2018-11-10 DIAGNOSIS — C3492 Malignant neoplasm of unspecified part of left bronchus or lung: Secondary | ICD-10-CM

## 2018-11-10 LAB — CBC WITH DIFFERENTIAL (CANCER CENTER ONLY)
Abs Immature Granulocytes: 0.07 10*3/uL (ref 0.00–0.07)
Basophils Absolute: 0 10*3/uL (ref 0.0–0.1)
Basophils Relative: 0 %
Eosinophils Absolute: 0.2 10*3/uL (ref 0.0–0.5)
Eosinophils Relative: 3 %
HEMATOCRIT: 38.5 % (ref 36.0–46.0)
Hemoglobin: 12.4 g/dL (ref 12.0–15.0)
Immature Granulocytes: 1 %
LYMPHS ABS: 1.6 10*3/uL (ref 0.7–4.0)
Lymphocytes Relative: 30 %
MCH: 30 pg (ref 26.0–34.0)
MCHC: 32.2 g/dL (ref 30.0–36.0)
MCV: 93 fL (ref 80.0–100.0)
MONO ABS: 0.5 10*3/uL (ref 0.1–1.0)
Monocytes Relative: 9 %
Neutro Abs: 3.1 10*3/uL (ref 1.7–7.7)
Neutrophils Relative %: 57 %
Platelet Count: 202 10*3/uL (ref 150–400)
RBC: 4.14 MIL/uL (ref 3.87–5.11)
RDW: 14.7 % (ref 11.5–15.5)
WBC Count: 5.4 10*3/uL (ref 4.0–10.5)
nRBC: 0 % (ref 0.0–0.2)

## 2018-11-10 LAB — CMP (CANCER CENTER ONLY)
ALT: <6 U/L (ref 0–44)
AST: 9 U/L — ABNORMAL LOW (ref 15–41)
Albumin: 3.2 g/dL — ABNORMAL LOW (ref 3.5–5.0)
Alkaline Phosphatase: 49 U/L (ref 38–126)
Anion gap: 11 (ref 5–15)
BILIRUBIN TOTAL: 0.4 mg/dL (ref 0.3–1.2)
BUN: 8 mg/dL (ref 8–23)
CO2: 28 mmol/L (ref 22–32)
Calcium: 8 mg/dL — ABNORMAL LOW (ref 8.9–10.3)
Chloride: 103 mmol/L (ref 98–111)
Creatinine: 0.75 mg/dL (ref 0.44–1.00)
GFR, Est AFR Am: >60 mL/min (ref >60)
GFR, Estimated: >60 mL/min (ref >60)
GLUCOSE: 89 mg/dL (ref 70–99)
Potassium: 3.3 mmol/L — ABNORMAL LOW (ref 3.5–5.1)
Sodium: 142 mmol/L (ref 135–145)
Total Protein: 6.2 g/dL — ABNORMAL LOW (ref 6.5–8.1)

## 2018-11-10 LAB — MAGNESIUM: Magnesium: 1.3 mg/dL — CL (ref 1.7–2.4)

## 2018-11-10 MED ORDER — SODIUM CHLORIDE 0.9 % IV SOLN
Freq: Once | INTRAVENOUS | Status: AC
  Administered 2018-11-10: 10:00:00 via INTRAVENOUS
  Filled 2018-11-10: qty 250

## 2018-11-10 MED ORDER — SODIUM CHLORIDE 0.9% FLUSH
10.0000 mL | INTRAVENOUS | Status: DC | PRN
  Administered 2018-11-10: 10 mL
  Filled 2018-11-10: qty 10

## 2018-11-10 MED ORDER — POTASSIUM CHLORIDE 2 MEQ/ML IV SOLN
Freq: Once | INTRAVENOUS | Status: AC
  Administered 2018-11-10: 10:00:00 via INTRAVENOUS
  Filled 2018-11-10: qty 10

## 2018-11-10 MED ORDER — HEPARIN SOD (PORK) LOCK FLUSH 100 UNIT/ML IV SOLN
500.0000 [IU] | Freq: Once | INTRAVENOUS | Status: AC | PRN
  Administered 2018-11-10: 500 [IU]
  Filled 2018-11-10: qty 5

## 2018-11-10 NOTE — Telephone Encounter (Signed)
Patient in lobby  Per patient received call re appointment being changed to 12/11 but was not able to reach anyone re keeping 12/10. Per patient she initially couldn't get a ride but was able to get one and wants to know  If appointments can be restored for today.  Spoke with infusion AD and ok to add patient back on for today.

## 2018-11-10 NOTE — Telephone Encounter (Signed)
Please have her start taking Magnesium oxide 400 mg BID. You can send Rx to her pharmacy [id number] with 1 refill.  Thanks

## 2018-11-10 NOTE — Telephone Encounter (Signed)
Mag 1.3

## 2018-11-10 NOTE — Progress Notes (Signed)
Patient did not get treatment today due to low urine output. Patient voided only 100 mls after 2.5 hours of pre-hydration. Per Dr. Benay Spice, patient was advised against treatment today and pass by scheduling for an appointment for Friday or any other day that works best for her next week. Patient knows to stay hydrated by drinking more clear fluids and less carbonated/caffiene drinks.

## 2018-11-10 NOTE — Telephone Encounter (Signed)
R/s appt per 12/10 sch message - pt is aware of appt change

## 2018-11-10 NOTE — Patient Instructions (Signed)
[facility] Discharge Instructions for Patients Receiving Chemotherapy  Today you received the following chemotherapy agents Irinotecan (CAMPTOSAR) & Cisplatin (PLATINOL).  To help prevent nausea and vomiting after your treatment, we encourage you to take your nausea medication as prescribed.   If you develop nausea and vomiting that is not controlled by your nausea medication, call the clinic.   BELOW ARE SYMPTOMS THAT SHOULD BE REPORTED IMMEDIATELY:  *FEVER GREATER THAN 100.5 F  *CHILLS WITH OR WITHOUT FEVER  NAUSEA AND VOMITING THAT IS NOT CONTROLLED WITH YOUR NAUSEA MEDICATION  *UNUSUAL SHORTNESS OF BREATH  *UNUSUAL BRUISING OR BLEEDING  TENDERNESS IN MOUTH AND THROAT WITH OR WITHOUT PRESENCE OF ULCERS  *URINARY PROBLEMS  *BOWEL PROBLEMS  UNUSUAL RASH Items with * indicate a potential emergency and should be followed up as soon as possible.  Feel free to call the clinic should you have any questions or concerns. The clinic phone number is [phone].  Please show the [facility] at check-in to the Emergency Department and triage nurse.  Irinotecan injection What is this medicine? IRINOTECAN (ir in oh TEE kan ) is a chemotherapy drug. It is used to treat colon and rectal cancer. This medicine may be used for other purposes; ask your health care provider or pharmacist if you have questions. COMMON BRAND NAME(S): Camptosar What should I tell my health care provider before I take this medicine? They need to know if you have any of these conditions: -blood disorders -dehydration -diarrhea -infection (especially a virus infection such as chickenpox, cold sores, or herpes) -liver disease -low blood counts, like low white cell, platelet, or red cell counts -recent or ongoing radiation therapy -an unusual or allergic reaction to irinotecan, sorbitol, other chemotherapy, other medicines, foods, dyes, or preservatives -pregnant or trying to get  pregnant -breast-feeding How should I use this medicine? This drug is given as an infusion into a vein. It is administered in a hospital or clinic by a specially trained health care professional. Talk to your pediatrician regarding the use of this medicine in children. Special care may be needed. Overdosage: If you think you have taken too much of this medicine contact a poison control center or emergency room at once. NOTE: This medicine is only for you. Do not share this medicine with others. What if I miss a dose? It is important not to miss your dose. Call your doctor or health care professional if you are unable to keep an appointment. What may interact with this medicine? Do not take this medicine with any of the following medications: -atazanavir -certain medicines for fungal infections like itraconazole and ketoconazole -St. John's Wort This medicine may also interact with the following medications: -dexamethasone -diuretics -laxatives -medicines for seizures like carbamazepine, mephobarbital, phenobarbital, phenytoin, primidone -medicines to increase blood counts like filgrastim, pegfilgrastim, sargramostim -prochlorperazine -vaccines This list may not describe all possible interactions. Give your health care provider a list of all the medicines, herbs, non-prescription drugs, or dietary supplements you use. Also tell them if you smoke, drink alcohol, or use illegal drugs. Some items may interact with your medicine. What should I watch for while using this medicine? Your condition will be monitored carefully while you are receiving this medicine. You will need important blood work done while you are taking this medicine. This drug may make you feel generally unwell. This is not uncommon, as chemotherapy can affect healthy cells as well as cancer cells. Report any side effects. Continue your course of treatment  even though you feel ill unless your doctor tells you to stop. In some  cases, you may be given additional medicines to help with side effects. Follow all directions for their use. You may get drowsy or dizzy. Do not drive, use machinery, or do anything that needs mental alertness until you know how this medicine affects you. Do not stand or sit up quickly, especially if you are an older patient. This reduces the risk of dizzy or fainting spells. Call your doctor or health care professional for advice if you get a fever, chills or sore throat, or other symptoms of a cold or flu. Do not treat yourself. This drug decreases your body's ability to fight infections. Try to avoid being around people who are sick. This medicine may increase your risk to bruise or bleed. Call your doctor or health care professional if you notice any unusual bleeding. Be careful brushing and flossing your teeth or using a toothpick because you may get an infection or bleed more easily. If you have any dental work done, tell your dentist you are receiving this medicine. Avoid taking products that contain aspirin, acetaminophen, ibuprofen, naproxen, or ketoprofen unless instructed by your doctor. These medicines may hide a fever. Do not become pregnant while taking this medicine. Women should inform their doctor if they wish to become pregnant or think they might be pregnant. There is a potential for serious side effects to an unborn child. Talk to your health care professional or pharmacist for more information. Do not breast-feed an infant while taking this medicine. What side effects may I notice from receiving this medicine? Side effects that you should report to your doctor or health care professional as soon as possible: -allergic reactions like skin rash, itching or hives, swelling of the face, lips, or tongue -low blood counts - this medicine may decrease the number of white blood cells, red blood cells and platelets. You may be at increased risk for infections and bleeding. -signs of infection  - fever or chills, cough, sore throat, pain or difficulty passing urine -signs of decreased platelets or bleeding - bruising, pinpoint red spots on the skin, black, tarry stools, blood in the urine -signs of decreased red blood cells - unusually weak or tired, fainting spells, lightheadedness -breathing problems -chest pain -diarrhea -feeling faint or lightheaded, falls -flushing, runny nose, sweating during infusion -mouth sores or pain -pain, swelling, redness or irritation where injected -pain, swelling, warmth in the leg -pain, tingling, numbness in the hands or feet -problems with balance, talking, walking -stomach cramps, pain -trouble passing urine or change in the amount of urine -vomiting as to be unable to hold down drinks or food -yellowing of the eyes or skin Side effects that usually do not require medical attention (report to your doctor or health care professional if they continue or are bothersome): -constipation -hair loss -headache -loss of appetite -nausea, vomiting -stomach upset This list may not describe all possible side effects. Call your doctor for medical advice about side effects. You may report side effects to FDA at 1-800-FDA-1088. Where should I keep my medicine? This drug is given in a hospital or clinic and will not be stored at home. NOTE: This sheet is a summary. It may not cover all possible information. If you have questions about this medicine, talk to your doctor, pharmacist, or health care provider.  2018 [employer]/Gold Standard (2013-05-17 16:29:32)  Cisplatin injection What is this medicine? CISPLATIN (SIS pla tin) is a chemotherapy drug. It  targets fast dividing cells, like cancer cells, and causes these cells to die. This medicine is used to treat many types of cancer like bladder, ovarian, and testicular cancers. This medicine may be used for other purposes; ask your health care provider or pharmacist if you have questions. COMMON BRAND  NAME(S): Platinol, Platinol -AQ What should I tell my health care provider before I take this medicine? They need to know if you have any of these conditions: -blood disorders -hearing problems -kidney disease -recent or ongoing radiation therapy -an unusual or allergic reaction to cisplatin, carboplatin, other chemotherapy, other medicines, foods, dyes, or preservatives -pregnant or trying to get pregnant -breast-feeding How should I use this medicine? This drug is given as an infusion into a vein. It is administered in a hospital or clinic by a specially trained health care professional. Talk to your pediatrician regarding the use of this medicine in children. Special care may be needed. Overdosage: If you think you have taken too much of this medicine contact a poison control center or emergency room at once. NOTE: This medicine is only for you. Do not share this medicine with others. What if I miss a dose? It is important not to miss a dose. Call your doctor or health care professional if you are unable to keep an appointment. What may interact with this medicine? -dofetilide -foscarnet -medicines for seizures -medicines to increase blood counts like filgrastim, pegfilgrastim, sargramostim -probenecid -pyridoxine used with altretamine -rituximab -some antibiotics like amikacin, gentamicin, neomycin, polymyxin B, streptomycin, tobramycin -sulfinpyrazone -vaccines -zalcitabine Talk to your doctor or health care professional before taking any of these medicines: -acetaminophen -aspirin -ibuprofen -ketoprofen -naproxen This list may not describe all possible interactions. Give your health care provider a list of all the medicines, herbs, non-prescription drugs, or dietary supplements you use. Also tell them if you smoke, drink alcohol, or use illegal drugs. Some items may interact with your medicine. What should I watch for while using this medicine? Your condition will be  monitored carefully while you are receiving this medicine. You will need important blood work done while you are taking this medicine. This drug may make you feel generally unwell. This is not uncommon, as chemotherapy can affect healthy cells as well as cancer cells. Report any side effects. Continue your course of treatment even though you feel ill unless your doctor tells you to stop. In some cases, you may be given additional medicines to help with side effects. Follow all directions for their use. Call your doctor or health care professional for advice if you get a fever, chills or sore throat, or other symptoms of a cold or flu. Do not treat yourself. This drug decreases your body's ability to fight infections. Try to avoid being around people who are sick. This medicine may increase your risk to bruise or bleed. Call your doctor or health care professional if you notice any unusual bleeding. Be careful brushing and flossing your teeth or using a toothpick because you may get an infection or bleed more easily. If you have any dental work done, tell your dentist you are receiving this medicine. Avoid taking products that contain aspirin, acetaminophen, ibuprofen, naproxen, or ketoprofen unless instructed by your doctor. These medicines may hide a fever. Do not become pregnant while taking this medicine. Women should inform their doctor if they wish to become pregnant or think they might be pregnant. There is a potential for serious side effects to an unborn child. Talk to your health care  professional or pharmacist for more information. Do not breast-feed an infant while taking this medicine. Drink fluids as directed while you are taking this medicine. This will help protect your kidneys. Call your doctor or health care professional if you get diarrhea. Do not treat yourself. What side effects may I notice from receiving this medicine? Side effects that you should report to your doctor or health care  professional as soon as possible: -allergic reactions like skin rash, itching or hives, swelling of the face, lips, or tongue -signs of infection - fever or chills, cough, sore throat, pain or difficulty passing urine -signs of decreased platelets or bleeding - bruising, pinpoint red spots on the skin, black, tarry stools, nosebleeds -signs of decreased red blood cells - unusually weak or tired, fainting spells, lightheadedness -breathing problems -changes in hearing -gout pain -low blood counts - This drug may decrease the number of white blood cells, red blood cells and platelets. You may be at increased risk for infections and bleeding. -nausea and vomiting -pain, swelling, redness or irritation at the injection site -pain, tingling, numbness in the hands or feet -problems with balance, movement -trouble passing urine or change in the amount of urine Side effects that usually do not require medical attention (report to your doctor or health care professional if they continue or are bothersome): -changes in vision -loss of appetite -metallic taste in the mouth or changes in taste This list may not describe all possible side effects. Call your doctor for medical advice about side effects. You may report side effects to FDA at 1-800-FDA-1088. Where should I keep my medicine? This drug is given in a hospital or clinic and will not be stored at home. NOTE: This sheet is a summary. It may not cover all possible information. If you have questions about this medicine, talk to your doctor, pharmacist, or health care provider.  2018 [employer]/Gold Standard (2008-02-23 14:40:54)

## 2018-11-11 ENCOUNTER — Inpatient Hospital Stay: Payer: Medicare Other

## 2018-11-12 ENCOUNTER — Other Ambulatory Visit: Payer: Self-pay | Admitting: Medical Oncology

## 2018-11-12 MED ORDER — MAGNESIUM OXIDE 400 (241.3 MG) MG PO TABS: 400.0000 mg | ORAL_TABLET | Freq: Two times a day (BID) | ORAL | 1 refills | Status: AC

## 2018-11-13 ENCOUNTER — Inpatient Hospital Stay: Payer: Medicare Other

## 2018-11-13 VITALS — BP 127/74 | HR 71 | Temp 97.8°F | Resp 16

## 2018-11-13 DIAGNOSIS — C3412 Malignant neoplasm of upper lobe, left bronchus or lung: Secondary | ICD-10-CM | POA: Diagnosis not present

## 2018-11-13 DIAGNOSIS — R112 Nausea with vomiting, unspecified: Secondary | ICD-10-CM

## 2018-11-13 DIAGNOSIS — E860 Dehydration: Secondary | ICD-10-CM

## 2018-11-13 DIAGNOSIS — C3492 Malignant neoplasm of unspecified part of left bronchus or lung: Secondary | ICD-10-CM

## 2018-11-13 LAB — CBC WITH DIFFERENTIAL (CANCER CENTER ONLY)
Abs Immature Granulocytes: 0.06 10*3/uL (ref 0.00–0.07)
Basophils Absolute: 0 10*3/uL (ref 0.0–0.1)
Basophils Relative: 0 %
Eosinophils Absolute: 0.3 10*3/uL (ref 0.0–0.5)
Eosinophils Relative: 6 %
HCT: 37.4 % (ref 36.0–46.0)
Hemoglobin: 12.2 g/dL (ref 12.0–15.0)
Immature Granulocytes: 1 %
Lymphocytes Relative: 22 %
Lymphs Abs: 1.3 10*3/uL (ref 0.7–4.0)
MCH: 30 pg (ref 26.0–34.0)
MCHC: 32.6 g/dL (ref 30.0–36.0)
MCV: 92.1 fL (ref 80.0–100.0)
MONO ABS: 0.6 10*3/uL (ref 0.1–1.0)
Monocytes Relative: 11 %
Neutro Abs: 3.4 10*3/uL (ref 1.7–7.7)
Neutrophils Relative %: 60 %
Platelet Count: 183 10*3/uL (ref 150–400)
RBC: 4.06 MIL/uL (ref 3.87–5.11)
RDW: 14.3 % (ref 11.5–15.5)
WBC Count: 5.6 10*3/uL (ref 4.0–10.5)
nRBC: 0 % (ref 0.0–0.2)

## 2018-11-13 LAB — CMP (CANCER CENTER ONLY)
ALT: <6 U/L (ref 0–44)
AST: 9 U/L — ABNORMAL LOW (ref 15–41)
Albumin: 3.1 g/dL — ABNORMAL LOW (ref 3.5–5.0)
Alkaline Phosphatase: 52 U/L (ref 38–126)
Anion gap: 11 (ref 5–15)
BILIRUBIN TOTAL: 0.5 mg/dL (ref 0.3–1.2)
BUN: 6 mg/dL — AB (ref 8–23)
CO2: 27 mmol/L (ref 22–32)
CREATININE: 0.72 mg/dL (ref 0.44–1.00)
Calcium: 8.8 mg/dL — ABNORMAL LOW (ref 8.9–10.3)
Chloride: 102 mmol/L (ref 98–111)
GFR, Est AFR Am: >60 mL/min (ref >60)
GFR, Estimated: >60 mL/min (ref >60)
Glucose, Bld: 88 mg/dL (ref 70–99)
Potassium: 3.6 mmol/L (ref 3.5–5.1)
Sodium: 140 mmol/L (ref 135–145)
Total Protein: 6.3 g/dL — ABNORMAL LOW (ref 6.5–8.1)

## 2018-11-13 LAB — MAGNESIUM: Magnesium: 1.2 mg/dL — CL (ref 1.7–2.4)

## 2018-11-13 MED ORDER — SODIUM CHLORIDE 0.9% FLUSH
10.0000 mL | INTRAVENOUS | Status: DC | PRN
  Administered 2018-11-13: 10 mL
  Filled 2018-11-13: qty 10

## 2018-11-13 MED ORDER — HEPARIN SOD (PORK) LOCK FLUSH 100 UNIT/ML IV SOLN
500.0000 [IU] | Freq: Once | INTRAVENOUS | Status: AC | PRN
  Administered 2018-11-13: 500 [IU]
  Filled 2018-11-13: qty 5

## 2018-11-13 MED ORDER — PALONOSETRON HCL INJECTION 0.25 MG/5ML
INTRAVENOUS | Status: AC
  Filled 2018-11-13: qty 5

## 2018-11-13 MED ORDER — SODIUM CHLORIDE 0.9 % IV SOLN
Freq: Once | INTRAVENOUS | Status: AC
  Administered 2018-11-13: 09:00:00 via INTRAVENOUS
  Filled 2018-11-13: qty 250

## 2018-11-13 MED ORDER — SODIUM CHLORIDE 0.9 % IV SOLN
30.0000 mg/m2 | Freq: Once | INTRAVENOUS | Status: AC
  Administered 2018-11-13: 54 mg via INTRAVENOUS
  Filled 2018-11-13: qty 54

## 2018-11-13 MED ORDER — IRINOTECAN HCL CHEMO INJECTION 100 MG/5ML
65.0000 mg/m2 | Freq: Once | INTRAVENOUS | Status: AC
  Administered 2018-11-13: 120 mg via INTRAVENOUS
  Filled 2018-11-13: qty 6

## 2018-11-13 MED ORDER — ATROPINE SULFATE 1 MG/ML IJ SOLN: 0.5000 mg | Freq: Once | INTRAMUSCULAR | Status: DC | PRN

## 2018-11-13 MED ORDER — SODIUM CHLORIDE 0.9 % IV SOLN
Freq: Once | INTRAVENOUS | Status: AC
  Administered 2018-11-13: 12:00:00 via INTRAVENOUS
  Filled 2018-11-13: qty 5

## 2018-11-13 MED ORDER — PALONOSETRON HCL INJECTION 0.25 MG/5ML
0.2500 mg | Freq: Once | INTRAVENOUS | Status: AC
  Administered 2018-11-13: 0.25 mg via INTRAVENOUS

## 2018-11-13 MED ORDER — POTASSIUM CHLORIDE 2 MEQ/ML IV SOLN
Freq: Once | INTRAVENOUS | Status: AC
  Administered 2018-11-13: 10:00:00 via INTRAVENOUS
  Filled 2018-11-13: qty 10

## 2018-11-13 MED ORDER — ATROPINE SULFATE 1 MG/ML IJ SOLN
INTRAMUSCULAR | Status: AC
  Filled 2018-11-13: qty 1

## 2018-11-13 NOTE — Patient Instructions (Signed)
[place] Discharge Instructions for Patients Receiving Chemotherapy  Today you received the following chemotherapy agents Irinotecan (CAMPTOSAR) & Cisplatin (PLATINOL).  To help prevent nausea and vomiting after your treatment, we encourage you to take your nausea medication as prescribed.   If you develop nausea and vomiting that is not controlled by your nausea medication, call the clinic.   BELOW ARE SYMPTOMS THAT SHOULD BE REPORTED IMMEDIATELY:  *FEVER GREATER THAN 100.5 F  *CHILLS WITH OR WITHOUT FEVER  NAUSEA AND VOMITING THAT IS NOT CONTROLLED WITH YOUR NAUSEA MEDICATION  *UNUSUAL SHORTNESS OF BREATH  *UNUSUAL BRUISING OR BLEEDING  TENDERNESS IN MOUTH AND THROAT WITH OR WITHOUT PRESENCE OF ULCERS  *URINARY PROBLEMS  *BOWEL PROBLEMS  UNUSUAL RASH Items with * indicate a potential emergency and should be followed up as soon as possible.  Feel free to call the clinic should you have any questions or concerns. The clinic phone number is [phone].  Please show the [facility] at check-in to the Emergency Department and triage nurse.  Irinotecan injection What is this medicine? IRINOTECAN (ir in oh TEE kan ) is a chemotherapy drug. It is used to treat colon and rectal cancer. This medicine may be used for other purposes; ask your health care provider or pharmacist if you have questions. COMMON BRAND NAME(S): Camptosar What should I tell my health care provider before I take this medicine? They need to know if you have any of these conditions: -blood disorders -dehydration -diarrhea -infection (especially a virus infection such as chickenpox, cold sores, or herpes) -liver disease -low blood counts, like low white cell, platelet, or red cell counts -recent or ongoing radiation therapy -an unusual or allergic reaction to irinotecan, sorbitol, other chemotherapy, other medicines, foods, dyes, or preservatives -pregnant or trying to get  pregnant -breast-feeding How should I use this medicine? This drug is given as an infusion into a vein. It is administered in a hospital or clinic by a specially trained health care professional. Talk to your pediatrician regarding the use of this medicine in children. Special care may be needed. Overdosage: If you think you have taken too much of this medicine contact a poison control center or emergency room at once. NOTE: This medicine is only for you. Do not share this medicine with others. What if I miss a dose? It is important not to miss your dose. Call your doctor or health care professional if you are unable to keep an appointment. What may interact with this medicine? Do not take this medicine with any of the following medications: -atazanavir -certain medicines for fungal infections like itraconazole and ketoconazole -St. John's Wort This medicine may also interact with the following medications: -dexamethasone -diuretics -laxatives -medicines for seizures like carbamazepine, mephobarbital, phenobarbital, phenytoin, primidone -medicines to increase blood counts like filgrastim, pegfilgrastim, sargramostim -prochlorperazine -vaccines This list may not describe all possible interactions. Give your health care provider a list of all the medicines, herbs, non-prescription drugs, or dietary supplements you use. Also tell them if you smoke, drink alcohol, or use illegal drugs. Some items may interact with your medicine. What should I watch for while using this medicine? Your condition will be monitored carefully while you are receiving this medicine. You will need important blood work done while you are taking this medicine. This drug may make you feel generally unwell. This is not uncommon, as chemotherapy can affect healthy cells as well as cancer cells. Report any side effects. Continue your course of treatment  even though you feel ill unless your doctor tells you to stop. In some  cases, you may be given additional medicines to help with side effects. Follow all directions for their use. You may get drowsy or dizzy. Do not drive, use machinery, or do anything that needs mental alertness until you know how this medicine affects you. Do not stand or sit up quickly, especially if you are an older patient. This reduces the risk of dizzy or fainting spells. Call your doctor or health care professional for advice if you get a fever, chills or sore throat, or other symptoms of a cold or flu. Do not treat yourself. This drug decreases your body's ability to fight infections. Try to avoid being around people who are sick. This medicine may increase your risk to bruise or bleed. Call your doctor or health care professional if you notice any unusual bleeding. Be careful brushing and flossing your teeth or using a toothpick because you may get an infection or bleed more easily. If you have any dental work done, tell your dentist you are receiving this medicine. Avoid taking products that contain aspirin, acetaminophen, ibuprofen, naproxen, or ketoprofen unless instructed by your doctor. These medicines may hide a fever. Do not become pregnant while taking this medicine. Women should inform their doctor if they wish to become pregnant or think they might be pregnant. There is a potential for serious side effects to an unborn child. Talk to your health care professional or pharmacist for more information. Do not breast-feed an infant while taking this medicine. What side effects may I notice from receiving this medicine? Side effects that you should report to your doctor or health care professional as soon as possible: -allergic reactions like skin rash, itching or hives, swelling of the face, lips, or tongue -low blood counts - this medicine may decrease the number of white blood cells, red blood cells and platelets. You may be at increased risk for infections and bleeding. -signs of infection  - fever or chills, cough, sore throat, pain or difficulty passing urine -signs of decreased platelets or bleeding - bruising, pinpoint red spots on the skin, black, tarry stools, blood in the urine -signs of decreased red blood cells - unusually weak or tired, fainting spells, lightheadedness -breathing problems -chest pain -diarrhea -feeling faint or lightheaded, falls -flushing, runny nose, sweating during infusion -mouth sores or pain -pain, swelling, redness or irritation where injected -pain, swelling, warmth in the leg -pain, tingling, numbness in the hands or feet -problems with balance, talking, walking -stomach cramps, pain -trouble passing urine or change in the amount of urine -vomiting as to be unable to hold down drinks or food -yellowing of the eyes or skin Side effects that usually do not require medical attention (report to your doctor or health care professional if they continue or are bothersome): -constipation -hair loss -headache -loss of appetite -nausea, vomiting -stomach upset This list may not describe all possible side effects. Call your doctor for medical advice about side effects. You may report side effects to FDA at 1-800-FDA-1088. Where should I keep my medicine? This drug is given in a hospital or clinic and will not be stored at home. NOTE: This sheet is a summary. It may not cover all possible information. If you have questions about this medicine, talk to your doctor, pharmacist, or health care provider.  2018 Elsevier/Gold Standard (2013-05-17 16:29:32)  Cisplatin injection What is this medicine? CISPLATIN (SIS pla tin) is a chemotherapy drug. It  targets fast dividing cells, like cancer cells, and causes these cells to die. This medicine is used to treat many types of cancer like bladder, ovarian, and testicular cancers. This medicine may be used for other purposes; ask your health care provider or pharmacist if you have questions. COMMON BRAND  NAME(S): Platinol, Platinol -AQ What should I tell my health care provider before I take this medicine? They need to know if you have any of these conditions: -blood disorders -hearing problems -kidney disease -recent or ongoing radiation therapy -an unusual or allergic reaction to cisplatin, carboplatin, other chemotherapy, other medicines, foods, dyes, or preservatives -pregnant or trying to get pregnant -breast-feeding How should I use this medicine? This drug is given as an infusion into a vein. It is administered in a hospital or clinic by a specially trained health care professional. Talk to your pediatrician regarding the use of this medicine in children. Special care may be needed. Overdosage: If you think you have taken too much of this medicine contact a poison control center or emergency room at once. NOTE: This medicine is only for you. Do not share this medicine with others. What if I miss a dose? It is important not to miss a dose. Call your doctor or health care professional if you are unable to keep an appointment. What may interact with this medicine? -dofetilide -foscarnet -medicines for seizures -medicines to increase blood counts like filgrastim, pegfilgrastim, sargramostim -probenecid -pyridoxine used with altretamine -rituximab -some antibiotics like amikacin, gentamicin, neomycin, polymyxin B, streptomycin, tobramycin -sulfinpyrazone -vaccines -zalcitabine Talk to your doctor or health care professional before taking any of these medicines: -acetaminophen -aspirin -ibuprofen -ketoprofen -naproxen This list may not describe all possible interactions. Give your health care provider a list of all the medicines, herbs, non-prescription drugs, or dietary supplements you use. Also tell them if you smoke, drink alcohol, or use illegal drugs. Some items may interact with your medicine. What should I watch for while using this medicine? Your condition will be  monitored carefully while you are receiving this medicine. You will need important blood work done while you are taking this medicine. This drug may make you feel generally unwell. This is not uncommon, as chemotherapy can affect healthy cells as well as cancer cells. Report any side effects. Continue your course of treatment even though you feel ill unless your doctor tells you to stop. In some cases, you may be given additional medicines to help with side effects. Follow all directions for their use. Call your doctor or health care professional for advice if you get a fever, chills or sore throat, or other symptoms of a cold or flu. Do not treat yourself. This drug decreases your body's ability to fight infections. Try to avoid being around people who are sick. This medicine may increase your risk to bruise or bleed. Call your doctor or health care professional if you notice any unusual bleeding. Be careful brushing and flossing your teeth or using a toothpick because you may get an infection or bleed more easily. If you have any dental work done, tell your dentist you are receiving this medicine. Avoid taking products that contain aspirin, acetaminophen, ibuprofen, naproxen, or ketoprofen unless instructed by your doctor. These medicines may hide a fever. Do not become pregnant while taking this medicine. Women should inform their doctor if they wish to become pregnant or think they might be pregnant. There is a potential for serious side effects to an unborn child. Talk to your health care  professional or pharmacist for more information. Do not breast-feed an infant while taking this medicine. Drink fluids as directed while you are taking this medicine. This will help protect your kidneys. Call your doctor or health care professional if you get diarrhea. Do not treat yourself. What side effects may I notice from receiving this medicine? Side effects that you should report to your doctor or health care  professional as soon as possible: -allergic reactions like skin rash, itching or hives, swelling of the face, lips, or tongue -signs of infection - fever or chills, cough, sore throat, pain or difficulty passing urine -signs of decreased platelets or bleeding - bruising, pinpoint red spots on the skin, black, tarry stools, nosebleeds -signs of decreased red blood cells - unusually weak or tired, fainting spells, lightheadedness -breathing problems -changes in hearing -gout pain -low blood counts - This drug may decrease the number of white blood cells, red blood cells and platelets. You may be at increased risk for infections and bleeding. -nausea and vomiting -pain, swelling, redness or irritation at the injection site -pain, tingling, numbness in the hands or feet -problems with balance, movement -trouble passing urine or change in the amount of urine Side effects that usually do not require medical attention (report to your doctor or health care professional if they continue or are bothersome): -changes in vision -loss of appetite -metallic taste in the mouth or changes in taste This list may not describe all possible side effects. Call your doctor for medical advice about side effects. You may report side effects to FDA at 1-800-FDA-1088. Where should I keep my medicine? This drug is given in a hospital or clinic and will not be stored at home. NOTE: This sheet is a summary. It may not cover all possible information. If you have questions about this medicine, talk to your doctor, pharmacist, or health care provider.  2018 Elsevier/Gold Standard (2008-02-23 14:40:54)

## 2018-11-17 ENCOUNTER — Ambulatory Visit: Payer: Medicare Other

## 2018-11-17 ENCOUNTER — Other Ambulatory Visit: Payer: Medicare Other

## 2018-11-19 ENCOUNTER — Telehealth: Payer: Self-pay | Admitting: *Deleted

## 2018-11-19 DIAGNOSIS — R112 Nausea with vomiting, unspecified: Secondary | ICD-10-CM

## 2018-11-19 DIAGNOSIS — E860 Dehydration: Secondary | ICD-10-CM

## 2018-11-19 DIAGNOSIS — C3492 Malignant neoplasm of unspecified part of left bronchus or lung: Secondary | ICD-10-CM

## 2018-11-19 NOTE — Telephone Encounter (Signed)
"  Rebekah Henderson [phone]).  Received treatment last Friday, (Cisplatin, Irinotecan).  Don't know if I can take treatment tomorrow.  Past two days, eating crackers, Gatorade and trying 2 L Ginger Ale but the majority came back up.  Vomited Sunday then Monday diarrhea started.    Past three days, both vomiting and liquid diarrhea.    Diarrhea five times today; vomiting twice in trash.  Don't know what it looked like or how much.    Don"t remember the other days.  Frequency every fifteen minutes trying after I messed on myself trying to get to bathroom.  Liquid yellow bowels turning brown.    Temp with call = 98.0  F.  No dizziness, trouble breathing or feeling like pass out.  Feel as if I can hardly hold it up.  Imodium makes me cramp so I took half pill this morning and Zofran.  At noon I used Phenergan." Verbal order received and read back from Dr. Julien Nordmann for patient to Report to [facility] ER if in distress, continue Imodium, nausea medications.  Scheduled with  [facility] tomorrow.  Verbal order received, read back from Tanner PA for Mg level.   [name] given orders.  Denies ER need at this time.  Scheduling message sent.  Reviewed Imodium use with Irinotecan.  Reports "I took two Imodium at 4:00 pm.  Zofran at 4:10 pm".

## 2018-11-20 ENCOUNTER — Inpatient Hospital Stay: Payer: Medicare Other

## 2018-11-20 ENCOUNTER — Telehealth: Payer: Self-pay

## 2018-11-20 ENCOUNTER — Encounter (HOSPITAL_COMMUNITY): Admission: RE | Admit: 2018-11-20 | Payer: Medicare Other | Source: Ambulatory Visit

## 2018-11-20 ENCOUNTER — Other Ambulatory Visit: Payer: Self-pay

## 2018-11-20 ENCOUNTER — Encounter: Payer: Medicare Other | Admitting: Medical

## 2018-11-20 DIAGNOSIS — E860 Dehydration: Secondary | ICD-10-CM

## 2018-11-20 NOTE — Telephone Encounter (Signed)
Pt experiencing nausea and vomiting since Sunday. Encouraged to come in for lab work and fluids/electrolyte replacement, but although pt feeling better preference to go to [facility] d/t option presented to her by Dr. Julien Nordmann. Somewhat resolved this morning and able to start drinking again. Appt scheduled and orders placed under signed and held for RN to release. Orders for NS to run at 563mL/hr over 2 hours and saline/heparin flushes. Communicated appt with pt. Appts at [facility] cancelled. In-basket sent to Dr. Julien Nordmann and nurses to notify of changes.

## 2018-11-24 ENCOUNTER — Ambulatory Visit: Payer: Medicare Other

## 2018-11-24 ENCOUNTER — Other Ambulatory Visit: Payer: Medicare Other

## 2018-11-24 ENCOUNTER — Telehealth: Payer: Self-pay | Admitting: Internal Medicine

## 2018-11-24 ENCOUNTER — Ambulatory Visit: Payer: Medicare Other | Admitting: Internal Medicine

## 2018-11-24 NOTE — Telephone Encounter (Signed)
MM PM PAL - move lab/port/fu from 12/31 to 12/30 w/SJ. Spoke with patient.

## 2018-11-30 ENCOUNTER — Inpatient Hospital Stay (HOSPITAL_BASED_OUTPATIENT_CLINIC_OR_DEPARTMENT_OTHER): Payer: Medicare Other | Admitting: Nurse Practitioner

## 2018-11-30 ENCOUNTER — Inpatient Hospital Stay: Payer: Medicare Other

## 2018-11-30 ENCOUNTER — Telehealth: Payer: Self-pay | Admitting: *Deleted

## 2018-11-30 ENCOUNTER — Encounter: Payer: Self-pay | Admitting: Nurse Practitioner

## 2018-11-30 VITALS — BP 136/77 | HR 66 | Temp 97.8°F | Resp 18 | Ht 64.0 in | Wt 159.2 lb

## 2018-11-30 DIAGNOSIS — C7951 Secondary malignant neoplasm of bone: Secondary | ICD-10-CM | POA: Diagnosis not present

## 2018-11-30 DIAGNOSIS — R112 Nausea with vomiting, unspecified: Secondary | ICD-10-CM

## 2018-11-30 DIAGNOSIS — C3412 Malignant neoplasm of upper lobe, left bronchus or lung: Secondary | ICD-10-CM

## 2018-11-30 DIAGNOSIS — R300 Dysuria: Secondary | ICD-10-CM

## 2018-11-30 DIAGNOSIS — D6481 Anemia due to antineoplastic chemotherapy: Secondary | ICD-10-CM

## 2018-11-30 DIAGNOSIS — R197 Diarrhea, unspecified: Secondary | ICD-10-CM

## 2018-11-30 DIAGNOSIS — C3492 Malignant neoplasm of unspecified part of left bronchus or lung: Secondary | ICD-10-CM

## 2018-11-30 DIAGNOSIS — T451X5A Adverse effect of antineoplastic and immunosuppressive drugs, initial encounter: Secondary | ICD-10-CM

## 2018-11-30 DIAGNOSIS — R350 Frequency of micturition: Secondary | ICD-10-CM | POA: Diagnosis not present

## 2018-11-30 DIAGNOSIS — E860 Dehydration: Secondary | ICD-10-CM

## 2018-11-30 DIAGNOSIS — Z5111 Encounter for antineoplastic chemotherapy: Secondary | ICD-10-CM

## 2018-11-30 DIAGNOSIS — Z7189 Other specified counseling: Secondary | ICD-10-CM

## 2018-11-30 LAB — CBC WITH DIFFERENTIAL (CANCER CENTER ONLY)
Abs Immature Granulocytes: 0.02 10*3/uL (ref 0.00–0.07)
BASOS ABS: 0 10*3/uL (ref 0.0–0.1)
Basophils Relative: 0 %
EOS PCT: 15 %
Eosinophils Absolute: 0.6 10*3/uL — ABNORMAL HIGH (ref 0.0–0.5)
HCT: 33.5 % — ABNORMAL LOW (ref 36.0–46.0)
Hemoglobin: 11 g/dL — ABNORMAL LOW (ref 12.0–15.0)
Immature Granulocytes: 1 %
Lymphocytes Relative: 45 %
Lymphs Abs: 1.7 10*3/uL (ref 0.7–4.0)
MCH: 30.7 pg (ref 26.0–34.0)
MCHC: 32.8 g/dL (ref 30.0–36.0)
MCV: 93.6 fL (ref 80.0–100.0)
Monocytes Absolute: 0.4 10*3/uL (ref 0.1–1.0)
Monocytes Relative: 11 %
Neutro Abs: 1.1 10*3/uL — ABNORMAL LOW (ref 1.7–7.7)
Neutrophils Relative %: 28 %
Platelet Count: 120 10*3/uL — ABNORMAL LOW (ref 150–400)
RBC: 3.58 MIL/uL — ABNORMAL LOW (ref 3.87–5.11)
RDW: 15.4 % (ref 11.5–15.5)
WBC Count: 3.8 10*3/uL — ABNORMAL LOW (ref 4.0–10.5)
nRBC: 0 % (ref 0.0–0.2)

## 2018-11-30 LAB — URINALYSIS, COMPLETE (UACMP) WITH MICROSCOPIC
Bilirubin Urine: NEGATIVE
Glucose, UA: NEGATIVE mg/dL
Ketones, ur: NEGATIVE mg/dL
Nitrite: POSITIVE — AB
PROTEIN: 100 mg/dL — AB
Specific Gravity, Urine: 1.026 (ref 1.005–1.030)
WBC, UA: >50 WBC/hpf — ABNORMAL HIGH (ref 0–5)
pH: 5 (ref 5.0–8.0)

## 2018-11-30 LAB — CMP (CANCER CENTER ONLY)
ALK PHOS: 53 U/L (ref 38–126)
ALT: 7 U/L (ref 0–44)
AST: 9 U/L — ABNORMAL LOW (ref 15–41)
Albumin: 3.5 g/dL (ref 3.5–5.0)
Anion gap: 9 (ref 5–15)
BUN: 8 mg/dL (ref 8–23)
CALCIUM: 9.1 mg/dL (ref 8.9–10.3)
CO2: 26 mmol/L (ref 22–32)
Chloride: 106 mmol/L (ref 98–111)
Creatinine: 0.74 mg/dL (ref 0.44–1.00)
GFR, Est AFR Am: >60 mL/min (ref >60)
GFR, Estimated: >60 mL/min (ref >60)
Glucose, Bld: 87 mg/dL (ref 70–99)
Potassium: 3.6 mmol/L (ref 3.5–5.1)
Sodium: 141 mmol/L (ref 135–145)
Total Bilirubin: 0.4 mg/dL (ref 0.3–1.2)
Total Protein: 6.5 g/dL (ref 6.5–8.1)

## 2018-11-30 MED ORDER — ONDANSETRON 4 MG PO TBDP: ORAL_TABLET | ORAL | 1 refills | Status: DC

## 2018-11-30 MED ORDER — SODIUM CHLORIDE 0.9% FLUSH
10.0000 mL | INTRAVENOUS | Status: DC | PRN
  Administered 2018-11-30: 10 mL
  Filled 2018-11-30: qty 10

## 2018-11-30 MED ORDER — HEPARIN SOD (PORK) LOCK FLUSH 100 UNIT/ML IV SOLN
250.0000 [IU] | Freq: Once | INTRAVENOUS | Status: AC | PRN
  Administered 2018-11-30: 250 [IU]
  Filled 2018-11-30: qty 5

## 2018-11-30 MED ORDER — DIPHENOXYLATE-ATROPINE 2.5-0.025 MG PO TABS: 1.0000 | ORAL_TABLET | Freq: Four times a day (QID) | ORAL | 0 refills | Status: DC | PRN

## 2018-11-30 MED ORDER — CIPROFLOXACIN HCL 500 MG PO TABS: 500.0000 mg | ORAL_TABLET | Freq: Two times a day (BID) | ORAL | 0 refills | Status: DC

## 2018-11-30 NOTE — Telephone Encounter (Signed)
Contacted patient per Durenda Age, NP request: Urinalysis + for UTI. Cipro sent to pharmacy. Encouraged to increase intake of fluids. Patient verbalized understanding.

## 2018-11-30 NOTE — Progress Notes (Addendum)
[place] [phone]   [phone]  OFFICE PROGRESS NOTE  [employer], MD [address] [place] [address]  DIAGNOSIS: Metastatic small cell lung cancer initially diagnosed as Limited stage (T1b, N2, M0) small cell lung cancer presented with left lower lobe/infrahilar mass and large mediastinal lymphadenopathy diagnosed in October 2017.  PRIOR THERAPY: 1) Systemic chemotherapy with cisplatin 60 MG/M2 on day 1 and etoposide 120 MG/M2 on days 1, 2 and 3 status post 1 cycle. This was discontinued secondary to intolerance. 2) Systemic chemotherapy with carboplatin for AUC of 4 on day 1 and etoposide 100 MG/M2 on days 1, 2 and 3 with Neulasta support on day 4 every 3 weeks. Status post 5 cycles. This was concurrent with radiation in [place], [place]. 3) prophylactic cranial irradiation. 4) Systemic chemotherapy with carboplatin for AUC of 5 on day 1, etoposide 100 mg/M2 on days 1, 2 and 3 as well as a Tecentriq (Atezolizumab) 1200 mg IV every 3 weeks with Neulasta support.  First dose February 17, 2018.  Status post 6 cycles of partial response. 5) Maintenance treatment with immunotherapy with Tecentriq 1200 mg IV every 3 weeks.  First dose June 30, 2018.  Status post 6 cycles.  Last dose was given October 13, 2018 discontinued secondary to disease progression.   CURRENT THERAPY: Systemic chemotherapy with cisplatin 30 mg/M2 and irinotecan 65 mg/M2 on days 1 and 8 every 3 weeks.  First dose November 10, 2018.  Here for follow-up prior to cycle 2.   INTERVAL HISTORY: Rebekah Henderson 62 y.o. female returns to the clinic today for follow-up visit accompanied by a friend.  She is struggling with side effects and considering her options moving forwards. She feels fine today, and states she has not had diarrhea in several days.  However, she had diarrhea repeatedly despite taking Imodium following her last treatment.  She states that she had a awful  Christmas holiday because of nausea and diarrhea.  We discussed adding Lomotil and taking Imodium on a schedule following treatment.  Dr. Julien Nordmann will also dose reduce and we will give her IV fluids as needed.  She is having symptoms of urinary tract infection.  She reports frequency, difficulty evacuating her bladder, and burning with urination.  We will get a urinalysis, culture and screen.   MEDICAL HISTORY: Past Medical History:  Diagnosis Date  . 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 Anxiety    takes Prozac daily  . Arthritis   . Benign fundic gland polyps of stomach   . Colon polyps   . COPD (chronic obstructive pulmonary disease) ([place])   . Dehydration 03/06/2017  . Diverticulitis   . Dyspnea    with exertion  . Encounter for antineoplastic chemotherapy 12/03/2016  . Fibromyalgia   . GERD (gastroesophageal reflux disease)    takes Pantoprazole daily  . Hypertension    takes Metoprolol,Triamterene-HCTZ,and Amlodipine daily  . Hypothyroidism    takes Synthroid daily  . IBS (irritable bowel syndrome)   . lung ca dx'd 10/02/2016   skin, lung  . PONV (postoperative nausea and vomiting)    pt also states that she had some difficulty breathing after cervical fusion    ALLERGIES:  is allergic to penicillins; codeine; fentanyl; ranitidine hcl; keflex [cephalexin]; and lyrica [pregabalin].  MEDICATIONS:  Current Outpatient Medications  Medication Sig Dispense Refill  . albuterol (PROVENTIL HFA;VENTOLIN HFA) 108 (90 Base) MCG/ACT inhaler Inhale 2 puffs into the lungs every 4 (  four) hours as needed for wheezing or shortness of breath.    . ALPRAZolam (XANAX) 0.25 MG tablet Take 2 tablets (0.5 mg total) by mouth at bedtime as needed for anxiety. 30 tablet 0  . amLODipine (NORVASC) 5 MG tablet Take 5 mg by mouth daily.      Marland Kitchen dicyclomine (BENTYL) 20 MG tablet Take 20 mg by mouth 3 (three) times daily as needed for spasms.    Marland Kitchen estazolam (PROSOM) 2 MG tablet Take 2 mg by  mouth at bedtime.    Marland Kitchen estradiol (ESTRACE) 2 MG tablet Take 2 mg by mouth daily.      Marland Kitchen FeFum-FePoly-FA-B Cmp-C-Biot (INTEGRA PLUS) CAPS TAKE 1 CAPSULE BY MOUTH ONCE DAILY IN THE MORNING. (Patient taking differently: Take 1 capsule by mouth daily. ) 30 capsule 0  . FLUoxetine (PROZAC) 40 MG capsule Take 40 mg by mouth daily.   2  . HYDROcodone-acetaminophen (NORCO/VICODIN) 5-325 MG tablet Take 1 tablet by mouth every 4 (four) hours as needed for moderate pain.   0  . levothyroxine (SYNTHROID, LEVOTHROID) 50 MCG tablet Take 50 mcg by mouth daily before breakfast.     . lidocaine-prilocaine (EMLA) cream Apply 1 application topically as needed. To numb skin over port a cath: Squeeze a  small amount on cotton ball and place over port site 1-2 hours prior to chemotherapy. 30 g 0  . loperamide (IMODIUM) 2 MG capsule Take 2 mg by mouth every 2 (two) hours as needed for diarrhea or loose stools.    . magnesium oxide (MAG-OX) 400 (241.3 Mg) MG tablet Take 1 tablet (400 mg total) by mouth 2 (two) times daily. 60 tablet 1  . methylPREDNISolone (MEDROL DOSEPAK) 4 MG TBPK tablet Use as instructed. 21 tablet 0  . metoprolol succinate (TOPROL-XL) 25 MG 24 hr tablet Take 25 mg by mouth daily.    . ondansetron (ZOFRAN-ODT) 4 MG disintegrating tablet Dissolve one tablet by mouth every 8 hours as needed for nausea and vomiting, 20 tablet 1  . pantoprazole (PROTONIX) 40 MG tablet Take 40 mg by mouth 2 (two) times daily before a meal.   10  . promethazine (PHENERGAN) 25 MG tablet Take 1 tablet (25 mg total) by mouth every 6 (six) hours as needed. 30 tablet 0  . senna-docusate (SENOKOT-S) 8.6-50 MG tablet Take 1 tablet by mouth daily as needed.    . diphenoxylate-atropine (LOMOTIL) 2.5-0.025 MG tablet Take 1 tablet by mouth 4 (four) times daily as needed for diarrhea or loose stools (take if immodium does not work). 30 tablet 0   No current facility-administered medications for this visit.     SURGICAL HISTORY:  Past  Surgical History:  Procedure Laterality Date  . BIOPSY N/A 05/25/2013   Procedure: BIOPSIES (Random Colon; Duodenal; Gastric);  Surgeon: Danie Binder, MD;  Location: [facility];  Service: Endoscopy;  Laterality: N/A;  . BLADDER SUSPENSION    . BREAST ENHANCEMENT SURGERY    . BREAST IMPLANT REMOVAL    . CERVICAL FUSION  AUG 2013  . CHOLECYSTECTOMY  1999  . COLONOSCOPY  2007 [place]   POLYPS  . COLONOSCOPY WITH PROPOFOL N/A 05/25/2013   Procedure: COLONOSCOPY WITH PROPOFOL(at cecum 0957) total withdrawal time=45min);  Surgeon: Danie Binder, MD;  Location: [facility];  Service: Endoscopy;  Laterality: N/A;  . ESOPHAGOGASTRODUODENOSCOPY (EGD) WITH PROPOFOL N/A 05/25/2013   Procedure: ESOPHAGOGASTRODUODENOSCOPY (EGD) WITH PROPOFOL;  Surgeon: Danie Binder, MD;  Location: [facility];  Service: Endoscopy;  Laterality: N/A;  .  FOOT SURGERY    . IR FLUORO GUIDE PORT INSERTION RIGHT  03/04/2018  . IR US GUIDE VASC ACCESS RIGHT  03/04/2018  . POLYPECTOMY N/A 05/25/2013   Procedure: POLYPECTOMY (Rectal and Gastric);  Surgeon: Danie Binder, MD;  Location: [facility];  Service: Endoscopy;  Laterality: N/A;  . TONSILLECTOMY    . UPPER GASTROINTESTINAL ENDOSCOPY    . VIDEO BRONCHOSCOPY WITH ENDOBRONCHIAL ULTRASOUND  09/12/2016   Procedure: VIDEO BRONCHOSCOPY WITH ENDOBRONCHIAL ULTRASOUND AND BIOPSY;  Surgeon: Juanito Doom, MD;  Location: [facility];  Service: Cardiopulmonary;;    REVIEW OF SYSTEMS:  Constitutional: negative Ears, nose, mouth, throat, and face: negative Gastrointestinal: positive for diarrhea and nausea Genitourinary:positive for dysuria, frequency and hesitancy   PHYSICAL EXAMINATION: General appearance: alert, cooperative and appears stated age Head: atraumatic Resp: clear to auscultation bilaterally Cardio: regular rate and rhythm, S1, S2 normal, no murmur, click, rub or gallop GI: soft, non-tender; bowel sounds normal; no masses,  no organomegaly Neurologic: Alert and oriented X 3, normal  strength and tone. Normal symmetric reflexes. Normal coordination and gait   ECOG PERFORMANCE STATUS: 1 - Symptomatic but completely ambulatory  Blood pressure 136/77, pulse 66, temperature 97.8 F (36.6 C), temperature source Oral, resp. rate 18, height 5\' 4"  (1.626 m), weight 159 lb 4 oz (72.2 kg), SpO2 92 %.  LABORATORY DATA: Lab Results  Component Value Date   WBC 3.8 (L) 11/30/2018   HGB 11.0 (L) 11/30/2018   HCT 33.5 (L) 11/30/2018   MCV 93.6 11/30/2018   PLT 120 (L) 11/30/2018      Chemistry      Component Value Date/Time   NA 141 11/30/2018 0739   NA 141 09/08/2017 1009   K 3.6 11/30/2018 0739   K 3.5 09/08/2017 1009   CL 106 11/30/2018 0739   CO2 26 11/30/2018 0739   CO2 29 09/08/2017 1009   BUN 8 11/30/2018 0739   BUN 5.8 (L) 09/08/2017 1009   CREATININE 0.74 11/30/2018 0739   CREATININE 0.7 09/08/2017 1009      Component Value Date/Time   CALCIUM 9.1 11/30/2018 0739   CALCIUM 9.0 09/08/2017 1009   ALKPHOS 53 11/30/2018 0739   ALKPHOS 49 09/08/2017 1009   AST 9 (L) 11/30/2018 0739   AST 9 09/08/2017 1009   ALT 7 11/30/2018 0739   ALT <6 09/08/2017 1009   BILITOT 0.4 11/30/2018 0739   BILITOT 0.51 09/08/2017 1009       RADIOGRAPHIC STUDIES: Ct Abdomen Pelvis W Contrast  Result Date: 11/01/2018 CLINICAL DATA:  Nausea vomiting and diarrhea. EXAM: CT ABDOMEN AND PELVIS WITH CONTRAST TECHNIQUE: Multidetector CT imaging of the abdomen and pelvis was performed using the standard protocol following bolus administration of intravenous contrast. CONTRAST:  148mL ISOVUE-300 IOPAMIDOL (ISOVUE-300) INJECTION 61% COMPARISON:  Body CT 10/27/2018 FINDINGS: Lower chest: Patchy airspace consolidation in the superior segment of the right upper lobe and basilar segments of the left lower lobe with an element of volume loss and bronchiectasis. Hepatobiliary: Known left lobe hepatic metastatic lesion measures 2.5 cm in greatest dimension. Post cholecystectomy. Pancreas:  Unremarkable. No pancreatic ductal dilatation or surrounding inflammatory changes. Spleen: Normal in size without focal abnormality. Adrenals/Urinary Tract: Adrenal glands are unremarkable. Kidneys are without renal calculi, focal lesion, or hydronephrosis. Right renal cyst noted. Bladder is unremarkable. Stomach/Bowel: Stomach is within normal limits. Appendix appears normal. No evidence of bowel wall thickening, distention, or inflammatory changes. Stable periampullary duodenal diverticulum. Moderate left colonic diverticulosis without evidence of acute diverticulitis.  Vascular/Lymphatic: Aortic atherosclerosis. No enlarged abdominal or pelvic lymph nodes. Reproductive: Status post hysterectomy. No adnexal masses. Other: No abdominal wall hernia or abnormality. No abdominopelvic ascites. Musculoskeletal: 2 cm L3 sclerotic metastatic deposit is stable in size. IMPRESSION: No acute findings within the abdomen or pelvis. Known left lobe of the liver metastatic lesion measures 2.5 cm, slightly larger than on the most recent prior CT, which may be within the realm of differences in measurement technique. Moderate left colonic diverticulosis without evidence of acute diverticulitis. Stable L3 vertebral body sclerotic metastatic deposit. Persistent patchy peribronchial airspace opacities, atelectasis and bronchiectasis in the bilateral lower lobes. Electronically Signed   By: Fidela Salisbury M.D.   On: 11/01/2018 15:19    ASSESSMENT AND PLAN:  This is a very pleasant 62 years old white female with  1. Initial diagnosis October 2017 limited stage small cell lung cancer, status post 6 cycles of systemic chemotherapy with carboplatin and etoposide concurrent with radiation and followed by prophylactic cranial irradiation. She was then in observation for over a year.  2. Presented with fatigue, weakness and headaches. The patient had evidence for disease recurrence and she was a started on treatment with systemic  chemotherapy with carboplatin, etoposide and Tecentriq.  She is status post 6 cycles. The patient had no evidence for disease progression after the induction phase of her treatment.   3. She was started on maintenance treatment with Tecentriq Huey Bienenstock) status post 6 cycles.  She has been tolerating this treatment well.   4. Repeat scans of C/A/P showed evidence for disease progression with enlargement of AP window lymph node as well as liver lesion. She was given  several options for management of her condition including palliative care and hospice referral versus consideration of palliative radiotherapy to the AP window lymph node and close monitoring of the liver lesion with continuation of the immunotherapy versus discontinuation of the immunotherapy and considering the patient for systemic chemotherapy with a reduced dose cisplatin 30 mg/M2 and irinotecan 65 mg/M2 on days 1 and 8 every 3 weeks.   5. Seen today prior to cycle #2 planned for 12/31. We will dose reduce due to diarrhea, nausea and vomiting. She will begin taking imodium on a gradual with Lomotil for any persistent diarrhea not controlled by Imodium.  We did refill her Zofran as well.  She had questions regarding prognosis with and without chemotherapy.  Dr. Julien Nordmann answered all of her questions and offered emotional support.  It is likely that she has 3 to 6 months if she were to stop all treatment.  She has done quite well with treatment in the past.  She is concerned about quality of life and very much wants to spend time with her 36-year-old grandson and "does not want to be sick all the time".  For now she does want to proceed with treatment at the reduced dose and will make a decision after that treatment about whether or not to continue.   6. Urinary Tract infection - Cipro 500mg  BID x 5 days. She will increase fluids as well.   This patient was seen with Dr. Parthenia Ames. Mohamed.  All of the patient's questions were  answered.  She was advised to call immediately if she has any concerning symptoms in the interval.  The patient voices understanding of current disease status and treatment options and is in agreement with the current care plan. All questions were answered. The patient knows to call the clinic with any problems, questions or  concerns. We can certainly see the patient much sooner if necessary.  Sarah. Wynetta Emery, NP-C, AOCNP  Disclaimer: This note was dictated with voice recognition software. Similar sounding words can inadvertently be transcribed and may not be corrected upon review.  ADDENDUM: Hematology/Oncology Attending: I had a face-to-face encounter with the patient.  I recommended her care plan.  This is a very pleasant 62 years old white female with recurrent small cell lung cancer status post several treatment in the past and she is currently on reduced dose cisplatin and irinotecan on days 1 and 8 every 3 weeks status post 1 cycle.  She missed day 8 of the first cycle because of the significant nausea vomiting as well as diarrhea. She is feeling much better and she would like to proceed with the treatment again.  I will reduce her dose of cisplatin to 25 mg/M2 and irinotecan to 50 mg/M2 on days 1 and 8 every 3 weeks.  The patient was advised to use Imodium and she will be also given prescription for Lomotil if needed for diarrhea. For the urinary tract infection we started the patient on Cipro 500 mg p.o. twice daily for 5 days. The patient will come back for follow-up visit in 3 weeks for evaluation before starting cycle #3 but she was advised to call immediately if she has any concerning symptoms in the interval.  Disclaimer: This note was dictated with voice recognition software. Similar sounding words can inadvertently be transcribed and may be missed upon review. Eilleen Kempf, MD 12/02/18

## 2018-11-30 NOTE — Patient Instructions (Signed)
Thank you for choosing [facility] to provide your oncology and hematology care.  To afford each patient quality time with our providers, please arrive 30 minutes before your scheduled appointment time.  If you arrive late for your appointment, you may be asked to reschedule.  We strive to give you quality time with our providers, and arriving late affects you and other patients whose appointments are after yours.    If you are a no show for multiple scheduled visits, you may be dismissed from the clinic at the providers discretion.     Again, thank you for choosing [facility], our hope is that these requests will decrease the amount of time that you wait before being seen by our physicians.  ______________________________________________________________________   Should you have questions after your visit to the [facility], please contact our office at [phone] between the hours of 8:30 and 4:30 p.m.    Voicemails left after 4:30p.m will not be returned until the following business day.     For prescription refill requests, please have your pharmacy contact us directly.  Please also try to allow 48 hours for prescription requests.     Please contact the scheduling department for questions regarding scheduling.  For scheduling of procedures such as PET scans, CT scans, MRI, Ultrasound, etc please contact central scheduling at [phone].     Resources For Cancer Patients and Caregivers:    Oncolink.org:  A wonderful resource for patients and healthcare providers for information regarding your disease, ways to tract your treatment, what to expect, etc.      American Cancer Society:  800-227-2345  Can help patients locate various types of support and financial assistance   Cancer Care: 1-800-813-HOPE (4673) Provides financial assistance, online support groups, medication/co-pay assistance.     [place] DSS:  336-641-3447 Where to apply  for food stamps, Medicaid, and utility assistance   Medicare Rights Center: 800-333-4114 Helps people with Medicare understand their rights and benefits, navigate the Medicare system, and secure the quality healthcare they deserve   SCAT: 336-333-6589 [place] [employer]'s shared-ride transportation service for eligible riders who have a disability that prevents them from riding the fixed route bus.     For additional information on assistance programs please contact our social worker:   Abigail Elmore:  [phone]  

## 2018-12-01 ENCOUNTER — Ambulatory Visit: Payer: Medicare Other | Admitting: Internal Medicine

## 2018-12-01 ENCOUNTER — Other Ambulatory Visit: Payer: Medicare Other

## 2018-12-01 ENCOUNTER — Telehealth: Payer: Self-pay | Admitting: Nurse Practitioner

## 2018-12-01 DIAGNOSIS — J449 Chronic obstructive pulmonary disease, unspecified: Secondary | ICD-10-CM | POA: Diagnosis not present

## 2018-12-01 DIAGNOSIS — R0902 Hypoxemia: Secondary | ICD-10-CM | POA: Diagnosis not present

## 2018-12-01 NOTE — Telephone Encounter (Signed)
Per 12/30 los states: patient already has office visit with Julien Nordmann later in Jan

## 2018-12-02 LAB — URINE CULTURE: Culture: >=100000 — AB

## 2018-12-03 ENCOUNTER — Other Ambulatory Visit: Payer: Self-pay | Admitting: Internal Medicine

## 2018-12-03 ENCOUNTER — Inpatient Hospital Stay: Payer: Medicare Other | Attending: Internal Medicine

## 2018-12-03 ENCOUNTER — Telehealth: Payer: Self-pay

## 2018-12-03 VITALS — BP 112/69 | HR 70 | Temp 97.6°F | Resp 16

## 2018-12-03 DIAGNOSIS — M62838 Other muscle spasm: Secondary | ICD-10-CM | POA: Insufficient documentation

## 2018-12-03 DIAGNOSIS — Z87891 Personal history of nicotine dependence: Secondary | ICD-10-CM | POA: Diagnosis not present

## 2018-12-03 DIAGNOSIS — J449 Chronic obstructive pulmonary disease, unspecified: Secondary | ICD-10-CM | POA: Diagnosis not present

## 2018-12-03 DIAGNOSIS — Z5111 Encounter for antineoplastic chemotherapy: Secondary | ICD-10-CM | POA: Diagnosis not present

## 2018-12-03 DIAGNOSIS — C3412 Malignant neoplasm of upper lobe, left bronchus or lung: Secondary | ICD-10-CM | POA: Diagnosis not present

## 2018-12-03 DIAGNOSIS — E039 Hypothyroidism, unspecified: Secondary | ICD-10-CM | POA: Insufficient documentation

## 2018-12-03 DIAGNOSIS — C3492 Malignant neoplasm of unspecified part of left bronchus or lung: Secondary | ICD-10-CM

## 2018-12-03 DIAGNOSIS — I10 Essential (primary) hypertension: Secondary | ICD-10-CM | POA: Diagnosis not present

## 2018-12-03 DIAGNOSIS — C7951 Secondary malignant neoplasm of bone: Secondary | ICD-10-CM | POA: Diagnosis not present

## 2018-12-03 DIAGNOSIS — Z79899 Other long term (current) drug therapy: Secondary | ICD-10-CM | POA: Insufficient documentation

## 2018-12-03 MED ORDER — NITROFURANTOIN MONOHYD MACRO 100 MG PO CAPS: 100.0000 mg | ORAL_CAPSULE | Freq: Two times a day (BID) | ORAL | 0 refills | Status: DC

## 2018-12-03 MED ORDER — PALONOSETRON HCL INJECTION 0.25 MG/5ML
INTRAVENOUS | Status: AC
  Filled 2018-12-03: qty 5

## 2018-12-03 MED ORDER — IRINOTECAN HCL CHEMO INJECTION 100 MG/5ML
50.0000 mg/m2 | Freq: Once | INTRAVENOUS | Status: AC
  Administered 2018-12-03: 80 mg via INTRAVENOUS
  Filled 2018-12-03: qty 4

## 2018-12-03 MED ORDER — HEPARIN SOD (PORK) LOCK FLUSH 100 UNIT/ML IV SOLN
500.0000 [IU] | Freq: Once | INTRAVENOUS | Status: AC | PRN
  Administered 2018-12-03: 500 [IU]
  Filled 2018-12-03: qty 5

## 2018-12-03 MED ORDER — SODIUM CHLORIDE 0.9 % IV SOLN
Freq: Once | INTRAVENOUS | Status: AC
  Administered 2018-12-03: 08:00:00 via INTRAVENOUS
  Filled 2018-12-03: qty 250

## 2018-12-03 MED ORDER — POTASSIUM CHLORIDE 2 MEQ/ML IV SOLN
Freq: Once | INTRAVENOUS | Status: AC
  Administered 2018-12-03: 09:00:00 via INTRAVENOUS
  Filled 2018-12-03: qty 10

## 2018-12-03 MED ORDER — ATROPINE SULFATE 1 MG/ML IJ SOLN
INTRAMUSCULAR | Status: AC
  Filled 2018-12-03: qty 1

## 2018-12-03 MED ORDER — ATROPINE SULFATE 1 MG/ML IJ SOLN: 0.5000 mg | Freq: Once | INTRAMUSCULAR | Status: DC | PRN

## 2018-12-03 MED ORDER — SODIUM CHLORIDE 0.9 % IV SOLN
25.0000 mg/m2 | Freq: Once | INTRAVENOUS | Status: AC
  Administered 2018-12-03: 45 mg via INTRAVENOUS
  Filled 2018-12-03: qty 45

## 2018-12-03 MED ORDER — SODIUM CHLORIDE 0.9 % IV SOLN
Freq: Once | INTRAVENOUS | Status: AC
  Administered 2018-12-03: 11:00:00 via INTRAVENOUS
  Filled 2018-12-03: qty 5

## 2018-12-03 MED ORDER — SODIUM CHLORIDE 0.9% FLUSH
10.0000 mL | INTRAVENOUS | Status: DC | PRN
  Administered 2018-12-03: 10 mL
  Filled 2018-12-03: qty 10

## 2018-12-03 MED ORDER — PALONOSETRON HCL INJECTION 0.25 MG/5ML
0.2500 mg | Freq: Once | INTRAVENOUS | Status: AC
  Administered 2018-12-03: 0.25 mg via INTRAVENOUS

## 2018-12-03 NOTE — Patient Instructions (Signed)
[place] Discharge Instructions for Patients Receiving Chemotherapy  Today you received the following chemotherapy agents: Irinotecan (Camptosar) and Cisplatin (Platinol)   To help prevent nausea and vomiting after your treatment, we encourage you to take your nausea medication as directed. Received Aloxi during treatment today-->Take Phenergan (not Zofran) for the next 3 days as needed.    If you develop nausea and vomiting that is not controlled by your nausea medication, call the clinic.   BELOW ARE SYMPTOMS THAT SHOULD BE REPORTED IMMEDIATELY:  *FEVER GREATER THAN 100.5 F  *CHILLS WITH OR WITHOUT FEVER  NAUSEA AND VOMITING THAT IS NOT CONTROLLED WITH YOUR NAUSEA MEDICATION  *UNUSUAL SHORTNESS OF BREATH  *UNUSUAL BRUISING OR BLEEDING  TENDERNESS IN MOUTH AND THROAT WITH OR WITHOUT PRESENCE OF ULCERS  *URINARY PROBLEMS  *BOWEL PROBLEMS  UNUSUAL RASH Items with * indicate a potential emergency and should be followed up as soon as possible.  Feel free to call the clinic should you have any questions or concerns. The clinic phone number is [phone].  Please show the [place] at check-in to the Emergency Department and triage nurse.

## 2018-12-03 NOTE — Progress Notes (Signed)
Okay to treat w/ ANC 1.1 per Dr. Julien Nordmann. Spoke with him about urine culture results - he plans to send Rx for nitrofurantoin (Macrobid) to patient's pharmacy.   Demetrius Charity, PharmD, [facility] Pharmacist Pharmacy Phone: [phone] 12/03/2018

## 2018-12-03 NOTE — Telephone Encounter (Signed)
Addede patient request to move appt. To 1/9 into the infusion book. Per 1/2 walk in

## 2018-12-04 ENCOUNTER — Telehealth: Payer: Self-pay

## 2018-12-04 NOTE — Telephone Encounter (Signed)
Left a detailed msg concerning appointment change request. Per 1/2 walk in

## 2018-12-09 ENCOUNTER — Other Ambulatory Visit: Payer: Medicare Other

## 2018-12-09 ENCOUNTER — Ambulatory Visit: Payer: Medicare Other

## 2018-12-10 ENCOUNTER — Inpatient Hospital Stay: Payer: Medicare Other

## 2018-12-10 ENCOUNTER — Other Ambulatory Visit: Payer: Self-pay | Admitting: Medical

## 2018-12-10 ENCOUNTER — Inpatient Hospital Stay (HOSPITAL_BASED_OUTPATIENT_CLINIC_OR_DEPARTMENT_OTHER): Payer: Medicare Other | Admitting: Medical

## 2018-12-10 VITALS — BP 115/78 | HR 83 | Temp 98.0°F | Resp 18

## 2018-12-10 DIAGNOSIS — C3412 Malignant neoplasm of upper lobe, left bronchus or lung: Secondary | ICD-10-CM

## 2018-12-10 DIAGNOSIS — M62838 Other muscle spasm: Secondary | ICD-10-CM

## 2018-12-10 DIAGNOSIS — C7951 Secondary malignant neoplasm of bone: Secondary | ICD-10-CM

## 2018-12-10 DIAGNOSIS — Z5111 Encounter for antineoplastic chemotherapy: Secondary | ICD-10-CM | POA: Diagnosis not present

## 2018-12-10 DIAGNOSIS — I10 Essential (primary) hypertension: Secondary | ICD-10-CM | POA: Diagnosis not present

## 2018-12-10 DIAGNOSIS — E039 Hypothyroidism, unspecified: Secondary | ICD-10-CM

## 2018-12-10 DIAGNOSIS — C3492 Malignant neoplasm of unspecified part of left bronchus or lung: Secondary | ICD-10-CM

## 2018-12-10 DIAGNOSIS — J449 Chronic obstructive pulmonary disease, unspecified: Secondary | ICD-10-CM | POA: Diagnosis not present

## 2018-12-10 DIAGNOSIS — Z95828 Presence of other vascular implants and grafts: Secondary | ICD-10-CM

## 2018-12-10 DIAGNOSIS — Z87891 Personal history of nicotine dependence: Secondary | ICD-10-CM

## 2018-12-10 DIAGNOSIS — Z79899 Other long term (current) drug therapy: Secondary | ICD-10-CM | POA: Diagnosis not present

## 2018-12-10 LAB — CMP (CANCER CENTER ONLY)
ALT: <6 U/L (ref 0–44)
AST: 8 U/L — ABNORMAL LOW (ref 15–41)
Albumin: 3.4 g/dL — ABNORMAL LOW (ref 3.5–5.0)
Alkaline Phosphatase: 47 U/L (ref 38–126)
Anion gap: 11 (ref 5–15)
BUN: 6 mg/dL — ABNORMAL LOW (ref 8–23)
CALCIUM: 8.4 mg/dL — AB (ref 8.9–10.3)
CO2: 23 mmol/L (ref 22–32)
Chloride: 104 mmol/L (ref 98–111)
Creatinine: 0.68 mg/dL (ref 0.44–1.00)
GFR, Est AFR Am: >60 mL/min (ref >60)
GFR, Estimated: >60 mL/min (ref >60)
Glucose, Bld: 103 mg/dL — ABNORMAL HIGH (ref 70–99)
Potassium: 3.6 mmol/L (ref 3.5–5.1)
Sodium: 138 mmol/L (ref 135–145)
TOTAL PROTEIN: 6.4 g/dL — AB (ref 6.5–8.1)
Total Bilirubin: 0.6 mg/dL (ref 0.3–1.2)

## 2018-12-10 LAB — CBC WITH DIFFERENTIAL (CANCER CENTER ONLY)
Abs Immature Granulocytes: 0.11 10*3/uL — ABNORMAL HIGH (ref 0.00–0.07)
BASOS PCT: 0 %
Basophils Absolute: 0 10*3/uL (ref 0.0–0.1)
Eosinophils Absolute: 0.1 10*3/uL (ref 0.0–0.5)
Eosinophils Relative: 2 %
HCT: 32.6 % — ABNORMAL LOW (ref 36.0–46.0)
Hemoglobin: 11 g/dL — ABNORMAL LOW (ref 12.0–15.0)
Immature Granulocytes: 2 %
Lymphocytes Relative: 11 %
Lymphs Abs: 0.8 10*3/uL (ref 0.7–4.0)
MCH: 31.2 pg (ref 26.0–34.0)
MCHC: 33.7 g/dL (ref 30.0–36.0)
MCV: 92.4 fL (ref 80.0–100.0)
Monocytes Absolute: 0.8 10*3/uL (ref 0.1–1.0)
Monocytes Relative: 12 %
NEUTROS PCT: 73 %
Neutro Abs: 5.3 10*3/uL (ref 1.7–7.7)
Platelet Count: 135 10*3/uL — ABNORMAL LOW (ref 150–400)
RBC: 3.53 MIL/uL — ABNORMAL LOW (ref 3.87–5.11)
RDW: 15.8 % — ABNORMAL HIGH (ref 11.5–15.5)
WBC Count: 7.2 10*3/uL (ref 4.0–10.5)
nRBC: 0 % (ref 0.0–0.2)

## 2018-12-10 LAB — MAGNESIUM: Magnesium: 1 mg/dL — CL (ref 1.7–2.4)

## 2018-12-10 MED ORDER — PALONOSETRON HCL INJECTION 0.25 MG/5ML
0.2500 mg | Freq: Once | INTRAVENOUS | Status: AC
  Administered 2018-12-10: 0.25 mg via INTRAVENOUS

## 2018-12-10 MED ORDER — ATROPINE SULFATE 1 MG/ML IJ SOLN
INTRAMUSCULAR | Status: AC
  Filled 2018-12-10: qty 1

## 2018-12-10 MED ORDER — ATROPINE SULFATE 1 MG/ML IJ SOLN
0.5000 mg | Freq: Once | INTRAMUSCULAR | Status: AC | PRN
  Administered 2018-12-10: 0.5 mg via INTRAVENOUS

## 2018-12-10 MED ORDER — HEPARIN SOD (PORK) LOCK FLUSH 100 UNIT/ML IV SOLN
500.0000 [IU] | Freq: Once | INTRAVENOUS | Status: AC | PRN
  Administered 2018-12-10: 500 [IU]
  Filled 2018-12-10: qty 5

## 2018-12-10 MED ORDER — CYCLOBENZAPRINE HCL 5 MG PO TABS: 5.0000 mg | ORAL_TABLET | Freq: Three times a day (TID) | ORAL | 0 refills | Status: DC | PRN

## 2018-12-10 MED ORDER — IRINOTECAN HCL CHEMO INJECTION 40 MG/2ML
50.0000 mg/m2 | Freq: Once | INTRAVENOUS | Status: AC
  Administered 2018-12-10: 80 mg via INTRAVENOUS
  Filled 2018-12-10: qty 4

## 2018-12-10 MED ORDER — SODIUM CHLORIDE 0.9 % IV SOLN
25.0000 mg/m2 | Freq: Once | INTRAVENOUS | Status: AC
  Administered 2018-12-10: 45 mg via INTRAVENOUS
  Filled 2018-12-10: qty 45

## 2018-12-10 MED ORDER — TIZANIDINE HCL 2 MG PO CAPS: 2.0000 mg | ORAL_CAPSULE | Freq: Three times a day (TID) | ORAL | 0 refills | Status: DC | PRN

## 2018-12-10 MED ORDER — PALONOSETRON HCL INJECTION 0.25 MG/5ML
INTRAVENOUS | Status: AC
  Filled 2018-12-10: qty 5

## 2018-12-10 MED ORDER — SODIUM CHLORIDE 0.9% FLUSH
10.0000 mL | INTRAVENOUS | Status: DC | PRN
  Administered 2018-12-10: 10 mL
  Filled 2018-12-10: qty 10

## 2018-12-10 MED ORDER — SODIUM CHLORIDE 0.9 % IV SOLN
Freq: Once | INTRAVENOUS | Status: AC
  Administered 2018-12-10: 13:00:00 via INTRAVENOUS
  Filled 2018-12-10: qty 5

## 2018-12-10 MED ORDER — SODIUM CHLORIDE 0.9% FLUSH
10.0000 mL | INTRAVENOUS | Status: DC | PRN
  Administered 2018-12-10: 10 mL via INTRAVENOUS
  Filled 2018-12-10: qty 10

## 2018-12-10 MED ORDER — POTASSIUM CHLORIDE 2 MEQ/ML IV SOLN
Freq: Once | INTRAVENOUS | Status: AC
  Administered 2018-12-10: 11:00:00 via INTRAVENOUS
  Filled 2018-12-10: qty 10

## 2018-12-10 MED ORDER — SODIUM CHLORIDE 0.9 % IV SOLN
Freq: Once | INTRAVENOUS | Status: AC
  Administered 2018-12-10: 13:00:00 via INTRAVENOUS
  Filled 2018-12-10: qty 250

## 2018-12-10 NOTE — Patient Instructions (Signed)
[facility] [place] Discharge Instructions for Patients Receiving Chemotherapy  Today you received the following chemotherapy agents Cisplatin and Irinotecan  To help prevent nausea and vomiting after your treatment, we encourage you to take your nausea medication as directed.    If you develop nausea and vomiting that is not controlled by your nausea medication, call the clinic.   BELOW ARE SYMPTOMS THAT SHOULD BE REPORTED IMMEDIATELY:  *FEVER GREATER THAN 100.5 F  *CHILLS WITH OR WITHOUT FEVER  NAUSEA AND VOMITING THAT IS NOT CONTROLLED WITH YOUR NAUSEA MEDICATION  *UNUSUAL SHORTNESS OF BREATH  *UNUSUAL BRUISING OR BLEEDING  TENDERNESS IN MOUTH AND THROAT WITH OR WITHOUT PRESENCE OF ULCERS  *URINARY PROBLEMS  *BOWEL PROBLEMS  UNUSUAL RASH Items with * indicate a potential emergency and should be followed up as soon as possible.  Feel free to call the clinic should you have any questions or concerns. The clinic phone number is [phone].  Please show the [place] at check-in to the Emergency Department and triage nurse.

## 2018-12-10 NOTE — Progress Notes (Signed)
Per verbal order from MD [facility], may run the post hydration fluids over one hour at 586ml tonight. Pt and nurse [place] made aware.

## 2018-12-11 ENCOUNTER — Telehealth: Payer: Self-pay | Admitting: Emergency Medicine

## 2018-12-11 DIAGNOSIS — N390 Urinary tract infection, site not specified: Secondary | ICD-10-CM | POA: Diagnosis not present

## 2018-12-11 DIAGNOSIS — C3412 Malignant neoplasm of upper lobe, left bronchus or lung: Secondary | ICD-10-CM | POA: Diagnosis not present

## 2018-12-11 DIAGNOSIS — E063 Autoimmune thyroiditis: Secondary | ICD-10-CM | POA: Diagnosis not present

## 2018-12-11 DIAGNOSIS — Z6828 Body mass index (BMI) 28.0-28.9, adult: Secondary | ICD-10-CM | POA: Diagnosis not present

## 2018-12-11 DIAGNOSIS — C3492 Malignant neoplasm of unspecified part of left bronchus or lung: Secondary | ICD-10-CM | POA: Diagnosis not present

## 2018-12-11 NOTE — Telephone Encounter (Signed)
Returning call from [place] at [facility] regarding a lack of stock of Tizanidine in capsule form.  Request from pharmacist to change to tablet form.  VO from PA [place] to change from capsule to tablet.   Relayed to pharmacist.

## 2018-12-14 NOTE — Progress Notes (Signed)
[facility] Progress Note   Rebekah Henderson [id number] 04/10/1956 63 y.o.  Rebekah Henderson is managed by Dr. 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     Muscle spasm of the bilateral SCM muscles: Patient was given a prescription for Zanaflex 2 mg with instructions to take 1 tablet 3 times daily as needed for muscle spasms.  She was told not to take this medication if she were driving or needed to be alert.  Extensive stage small cell lung cancer: The patient is managed by Dr. Julien Nordmann and is currently treated with cisplatin and irinotecan with her last cycle dosed on 12/03/2018.  Please see After Visit Summary for patient specific instructions.  Future Appointments  Date Time Provider [place]  12/21/2018  1:15 PM [facility] LAB 2 [facility] None  12/21/2018  1:30 PM [facility] [place] FLUSH [facility] None  12/21/2018  2:00 PM Curt Bears, MD [facility] None  12/22/2018  8:30 AM [facility] INFUSION [facility] None  12/29/2018  8:00 AM [facility] LAB 1 [facility] None  12/29/2018  8:15 AM [facility] [place] FLUSH [facility] None  12/29/2018  9:00 AM [facility] INFUSION [facility] None    No orders of the defined types were placed in this encounter.      Subjective:   Patient ID:  Rebekah Henderson is a 63 y.o. (DOB 11-14-1956) female.  Chief Complaint: No chief complaint on file.   HPI Rebekah Henderson   is a 63 year old female with a history of an extensive stage small cell lung cancer who is managed by Dr. Julien Nordmann and is status post cycle 2, day 1 of cisplatin and irinotecan which was dosed on 12/03/2018.  She also has a history of hypomagnesemia with a magnesium  returning at 1.0 today.  She was seen in the infusion room today as she has been having pain in her bilateral SCM muscles.  She denies any change in activity and has had no trauma.  Medications: I have reviewed the patient's current medications.  Allergies:  Allergies  Allergen Reactions  . Penicillins Hives, Swelling and Other (See Comments)    Reaction:  Face/mouth swelling  Has patient had a PCN reaction causing immediate rash, facial/tongue/throat swelling, SOB or lightheadedness with hypotension: Yes Has patient had a PCN reaction causing severe rash involving mucus membranes or skin necrosis: No Has patient had a PCN reaction that required hospitalization No Has patient had a PCN reaction occurring within the last 10 years: No If all of the above answers are "NO", then may proceed with Cephalosporin use.  . Codeine Nausea Only  . Fentanyl Nausea And Vomiting    restless  . Ranitidine Hcl Other (See Comments) and Nausea Only    Reaction:  Dizziness   . Keflex [Cephalexin] Other (See Comments)    Reaction:  Unknown   . Lyrica [Pregabalin] Other (See Comments)    Reaction:  Somnolence     Past Medical History:  Diagnosis Date  . 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 Anxiety    takes Prozac daily  . Arthritis   . Benign fundic gland polyps of stomach   . Colon polyps   . COPD (chronic obstructive pulmonary disease) ([place])   . Dehydration 03/06/2017  .  Diverticulitis   . Dyspnea    with exertion  . Encounter for antineoplastic chemotherapy 12/03/2016  . Fibromyalgia   . GERD (gastroesophageal reflux disease)    takes Pantoprazole daily  . Hypertension    takes Metoprolol,Triamterene-HCTZ,and Amlodipine daily  . Hypothyroidism    takes Synthroid daily  . IBS (irritable bowel syndrome)   . lung ca dx'd 10/02/2016   skin, lung  . PONV (postoperative nausea and vomiting)    pt also states that she had some difficulty breathing after cervical fusion     Past Surgical History:  Procedure Laterality Date  . BIOPSY N/A 05/25/2013   Procedure: BIOPSIES (Random Colon; Duodenal; Gastric);  Surgeon: Danie Binder, MD;  Location: [facility];  Service: Endoscopy;  Laterality: N/A;  . BLADDER SUSPENSION    . BREAST ENHANCEMENT SURGERY    . BREAST IMPLANT REMOVAL    . CERVICAL FUSION  AUG 2013  . CHOLECYSTECTOMY  1999  . COLONOSCOPY  2007 [place]   POLYPS  . COLONOSCOPY WITH PROPOFOL N/A 05/25/2013   Procedure: COLONOSCOPY WITH PROPOFOL(at cecum 0957) total withdrawal time=57min);  Surgeon: Danie Binder, MD;  Location: [facility];  Service: Endoscopy;  Laterality: N/A;  . ESOPHAGOGASTRODUODENOSCOPY (EGD) WITH PROPOFOL N/A 05/25/2013   Procedure: ESOPHAGOGASTRODUODENOSCOPY (EGD) WITH PROPOFOL;  Surgeon: Danie Binder, MD;  Location: [facility];  Service: Endoscopy;  Laterality: N/A;  . FOOT SURGERY    . IR FLUORO GUIDE PORT INSERTION RIGHT  03/04/2018  . IR US GUIDE VASC ACCESS RIGHT  03/04/2018  . POLYPECTOMY N/A 05/25/2013   Procedure: POLYPECTOMY (Rectal and Gastric);  Surgeon: Danie Binder, MD;  Location: [facility];  Service: Endoscopy;  Laterality: N/A;  . TONSILLECTOMY    . UPPER GASTROINTESTINAL ENDOSCOPY    . VIDEO BRONCHOSCOPY WITH ENDOBRONCHIAL ULTRASOUND  09/12/2016   Procedure: VIDEO BRONCHOSCOPY WITH ENDOBRONCHIAL ULTRASOUND AND BIOPSY;  Surgeon: Juanito Doom, MD;  Location[facility];  Service: Cardiopulmonary;;    Family History  Problem Relation Age of Onset  . Breast cancer Mother   . Diabetes Maternal Grandfather   . Lung cancer Father   . Heart failure Sister 8       Died. Morbidly obese  . Colon cancer Neg Hx   . Colon polyps Neg Hx     Social History   Socioeconomic History  . Marital status: Divorced    Spouse name: Not on file  . Number of children: 1  . Years of education: College   . Highest education level: Not on file  Occupational History  . Occupation: Retired   Scientific laboratory technician  . Financial resource strain: Not  on file  . Food insecurity:    Worry: Not on file    Inability: Not on file  . Transportation needs:    Medical: Yes    Non-medical: Yes  Tobacco Use  . Smoking status: Former Smoker    Packs/day: 2.00    Years: 20.00    Pack years: 40.00    Last attempt to quit: 05/20/2004    Years since quitting: 14.5  . Smokeless tobacco: Never Used  Substance and Sexual Activity  . Alcohol use: No  . Drug use: No  . Sexual activity: Not on file  Lifestyle  . Physical activity:    Days per week: Not on file    Minutes per session: Not on file  . Stress: Not on file  Relationships  . Social connections:    Talks on phone: Not on  file    Gets together: Not on file    Attends religious service: Not on file    Active member of club or organization: Not on file    Attends meetings of clubs or organizations: Not on file    Relationship status: Not on file  . Intimate partner violence:    Fear of current or ex partner: Not on file    Emotionally abused: Not on file    Physically abused: Not on file    Forced sexual activity: Not on file  Other Topics Concern  . Not on file  Social History Narrative   Lives alone.  Retired from [employer]    Past Medical History, Surgical history, Social history, and Family history were reviewed and updated as appropriate.   Please see review of systems for further details on the patient's review from today.   Review of Systems:  Review of Systems  Constitutional: Negative for activity change, chills, diaphoresis and fever.  Musculoskeletal: Positive for myalgias. Negative for arthralgias, back pain, joint swelling, neck pain and neck stiffness.    Objective:   Physical Exam:  There were no vitals taken for this visit. ECOG: 0  Physical Exam Constitutional:      General: She is not in acute distress.    Appearance: She is not diaphoretic.  HENT:     Head: Normocephalic and atraumatic.  Cardiovascular:     Rate and Rhythm: Normal rate and  regular rhythm.     Heart sounds: Normal heart sounds. No murmur. No friction rub. No gallop.   Pulmonary:     Effort: Pulmonary effort is normal. No respiratory distress.     Breath sounds: Normal breath sounds. No wheezing or rales.  Musculoskeletal:        General: Tenderness (There is an area of firmness and tenderness in the left and right superior SCM muscles.  This area is consistent with a muscle spasm.) present.  Skin:    General: Skin is warm and dry.     Findings: No erythema or rash.  Neurological:     Mental Status: She is alert.     Lab Review:     Component Value Date/Time   NA 138 12/10/2018 0813   NA 141 09/08/2017 1009   K 3.6 12/10/2018 0813   K 3.5 09/08/2017 1009   CL 104 12/10/2018 0813   CO2 23 12/10/2018 0813   CO2 29 09/08/2017 1009   GLUCOSE 103 (H) 12/10/2018 0813   GLUCOSE 77 09/08/2017 1009   BUN 6 (L) 12/10/2018 0813   BUN 5.8 (L) 09/08/2017 1009   CREATININE 0.68 12/10/2018 0813   CREATININE 0.7 09/08/2017 1009   CALCIUM 8.4 (L) 12/10/2018 0813   CALCIUM 9.0 09/08/2017 1009   PROT 6.4 (L) 12/10/2018 0813   PROT 6.7 09/08/2017 1009   ALBUMIN 3.4 (L) 12/10/2018 0813   ALBUMIN 3.2 (L) 09/08/2017 1009   AST 8 (L) 12/10/2018 0813   AST 9 09/08/2017 1009   ALT <6 12/10/2018 0813   ALT <6 09/08/2017 1009   ALKPHOS 47 12/10/2018 0813   ALKPHOS 49 09/08/2017 1009   BILITOT 0.6 12/10/2018 0813   BILITOT 0.51 09/08/2017 1009   GFRNONAA >60 12/10/2018 0813   GFRNONAA >89 05/12/2013 1524   GFRAA >60 12/10/2018 0813   GFRAA >89 05/12/2013 1524       Component Value Date/Time   WBC 7.2 12/10/2018 0813   WBC 5.0 11/01/2018 1136   RBC 3.53 (L) 12/10/2018 0813  HGB 11.0 (L) 12/10/2018 0813   HGB 13.1 07/14/2017 1454   HCT 32.6 (L) 12/10/2018 0813   HCT 38.3 07/14/2017 1454   PLT 135 (L) 12/10/2018 0813   PLT 209 07/14/2017 1454   MCV 92.4 12/10/2018 0813   MCV 93.2 07/14/2017 1454   MCH 31.2 12/10/2018 0813   MCHC 33.7 12/10/2018 0813    RDW 15.8 (H) 12/10/2018 0813   RDW 14.6 (H) 07/14/2017 1454   LYMPHSABS 0.8 12/10/2018 0813   LYMPHSABS 1.5 07/14/2017 1454   MONOABS 0.8 12/10/2018 0813   MONOABS 0.7 07/14/2017 1454   EOSABS 0.1 12/10/2018 0813   EOSABS 0.2 07/14/2017 1454   BASOSABS 0.0 12/10/2018 0813   BASOSABS 0.0 07/14/2017 1454   -------------------------------  Imaging from last 24 hours (if applicable):  Radiology interpretation: No results found.

## 2018-12-15 ENCOUNTER — Other Ambulatory Visit: Payer: Medicare Other

## 2018-12-15 ENCOUNTER — Ambulatory Visit: Payer: Medicare Other

## 2018-12-15 ENCOUNTER — Ambulatory Visit: Payer: Medicare Other | Admitting: Internal Medicine

## 2018-12-17 ENCOUNTER — Other Ambulatory Visit: Payer: Self-pay | Admitting: Internal Medicine

## 2018-12-17 DIAGNOSIS — R110 Nausea: Secondary | ICD-10-CM

## 2018-12-21 ENCOUNTER — Inpatient Hospital Stay: Payer: Medicare Other

## 2018-12-21 ENCOUNTER — Telehealth: Payer: Self-pay | Admitting: Internal Medicine

## 2018-12-21 ENCOUNTER — Encounter: Payer: Self-pay | Admitting: Internal Medicine

## 2018-12-21 ENCOUNTER — Inpatient Hospital Stay (HOSPITAL_BASED_OUTPATIENT_CLINIC_OR_DEPARTMENT_OTHER): Payer: Medicare Other | Admitting: Internal Medicine

## 2018-12-21 VITALS — BP 122/70 | HR 73 | Temp 97.8°F | Resp 18 | Ht 64.0 in | Wt 162.9 lb

## 2018-12-21 DIAGNOSIS — Z79899 Other long term (current) drug therapy: Secondary | ICD-10-CM

## 2018-12-21 DIAGNOSIS — C3412 Malignant neoplasm of upper lobe, left bronchus or lung: Secondary | ICD-10-CM

## 2018-12-21 DIAGNOSIS — E039 Hypothyroidism, unspecified: Secondary | ICD-10-CM

## 2018-12-21 DIAGNOSIS — C3492 Malignant neoplasm of unspecified part of left bronchus or lung: Secondary | ICD-10-CM

## 2018-12-21 DIAGNOSIS — Z87891 Personal history of nicotine dependence: Secondary | ICD-10-CM | POA: Diagnosis not present

## 2018-12-21 DIAGNOSIS — I10 Essential (primary) hypertension: Secondary | ICD-10-CM

## 2018-12-21 DIAGNOSIS — C7951 Secondary malignant neoplasm of bone: Secondary | ICD-10-CM | POA: Diagnosis not present

## 2018-12-21 DIAGNOSIS — E860 Dehydration: Secondary | ICD-10-CM

## 2018-12-21 DIAGNOSIS — J449 Chronic obstructive pulmonary disease, unspecified: Secondary | ICD-10-CM

## 2018-12-21 DIAGNOSIS — Z5111 Encounter for antineoplastic chemotherapy: Secondary | ICD-10-CM

## 2018-12-21 DIAGNOSIS — R112 Nausea with vomiting, unspecified: Secondary | ICD-10-CM

## 2018-12-21 DIAGNOSIS — M62838 Other muscle spasm: Secondary | ICD-10-CM | POA: Diagnosis not present

## 2018-12-21 LAB — CBC WITH DIFFERENTIAL (CANCER CENTER ONLY)
Abs Immature Granulocytes: 0.02 K/uL (ref 0.00–0.07)
Basophils Absolute: 0 K/uL (ref 0.0–0.1)
Basophils Relative: 0 %
Eosinophils Absolute: 0.4 K/uL (ref 0.0–0.5)
Eosinophils Relative: 11 %
HCT: 28.9 % — ABNORMAL LOW (ref 36.0–46.0)
Hemoglobin: 9.6 g/dL — ABNORMAL LOW (ref 12.0–15.0)
Immature Granulocytes: 1 %
Lymphocytes Relative: 34 %
Lymphs Abs: 1.3 K/uL (ref 0.7–4.0)
MCH: 31.3 pg (ref 26.0–34.0)
MCHC: 33.2 g/dL (ref 30.0–36.0)
MCV: 94.1 fL (ref 80.0–100.0)
Monocytes Absolute: 0.3 K/uL (ref 0.1–1.0)
Monocytes Relative: 8 %
Neutro Abs: 1.8 K/uL (ref 1.7–7.7)
Neutrophils Relative %: 46 %
Platelet Count: 98 K/uL — ABNORMAL LOW (ref 150–400)
RBC: 3.07 MIL/uL — ABNORMAL LOW (ref 3.87–5.11)
RDW: 16.2 % — ABNORMAL HIGH (ref 11.5–15.5)
WBC Count: 3.9 K/uL — ABNORMAL LOW (ref 4.0–10.5)
nRBC: 0 % (ref 0.0–0.2)

## 2018-12-21 LAB — CMP (CANCER CENTER ONLY)
ALT: 8 U/L (ref 0–44)
AST: 8 U/L — ABNORMAL LOW (ref 15–41)
Albumin: 3.3 g/dL — ABNORMAL LOW (ref 3.5–5.0)
Alkaline Phosphatase: 56 U/L (ref 38–126)
Anion gap: 7 (ref 5–15)
BUN: 6 mg/dL — ABNORMAL LOW (ref 8–23)
CO2: 27 mmol/L (ref 22–32)
Calcium: 8.1 mg/dL — ABNORMAL LOW (ref 8.9–10.3)
Chloride: 107 mmol/L (ref 98–111)
Creatinine: 0.67 mg/dL (ref 0.44–1.00)
GFR, Est AFR Am: >60 mL/min
GFR, Estimated: >60 mL/min
Glucose, Bld: 82 mg/dL (ref 70–99)
Potassium: 4.1 mmol/L (ref 3.5–5.1)
Sodium: 141 mmol/L (ref 135–145)
Total Bilirubin: 0.5 mg/dL (ref 0.3–1.2)
Total Protein: 5.8 g/dL — ABNORMAL LOW (ref 6.5–8.1)

## 2018-12-21 LAB — MAGNESIUM: Magnesium: 1.1 mg/dL — CL (ref 1.7–2.4)

## 2018-12-21 MED ORDER — SODIUM CHLORIDE 0.9% FLUSH
10.0000 mL | INTRAVENOUS | Status: DC | PRN
  Administered 2018-12-21: 10 mL
  Filled 2018-12-21: qty 10

## 2018-12-21 MED ORDER — HEPARIN SOD (PORK) LOCK FLUSH 100 UNIT/ML IV SOLN
500.0000 [IU] | Freq: Once | INTRAVENOUS | Status: AC | PRN
  Administered 2018-12-21: 500 [IU]
  Filled 2018-12-21: qty 5

## 2018-12-21 NOTE — Telephone Encounter (Signed)
Scheduled appts per 01/20 los.  Gave patient the handout for central radiology and some contrast. Printed calendar and avs.

## 2018-12-21 NOTE — Progress Notes (Signed)
[facility] Telephone:([phone]   [phone]  OFFICE PROGRESS NOTE  Redmond School, MD [address] [place] [address]  DIAGNOSIS: Metastatic small cell lung cancer initially diagnosed as Limited stage (T1b, N2, M0) small cell lung cancer presented with left lower lobe/infrahilar mass and large mediastinal lymphadenopathy diagnosed in October 2017.  PRIOR THERAPY: 1) Systemic chemotherapy with cisplatin 60 MG/M2 on day 1 and etoposide 120 MG/M2 on days 1, 2 and 3 status post 1 cycle. This was discontinued secondary to intolerance. 2) Systemic chemotherapy with carboplatin for AUC of 4 on day 1 and etoposide 100 MG/M2 on days 1, 2 and 3 with Neulasta support on day 4 every 3 weeks. Status post 5 cycles. This was concurrent with radiation in [place], [place]. 3) prophylactic cranial irradiation. 4) Systemic chemotherapy with carboplatin for AUC of 5 on day 1, etoposide 100 mg/M2 on days 1, 2 and 3 as well as a Tecentriq (Atezolizumab) 1200 mg IV every 3 weeks with Neulasta support.  First dose February 17, 2018.  Status post 6 cycles of partial response. 5) Maintenance treatment with immunotherapy with Tecentriq 1200 mg IV every 3 weeks.  First dose June 30, 2018.  Status post 6 cycles.  Last dose was given October 13, 2018 discontinued secondary to disease progression.   CURRENT THERAPY: Systemic chemotherapy with cisplatin 30 mg/M2 and irinotecan 65 mg/M2 on days 1 and 8 every 3 weeks.  First dose November 10, 2018.  Starting cycle #2 her dose of cisplatin was reduced to 25 mg/M2 and irinotecan 50 mg/M2 on days 1 and 8 every 3 weeks.  Status post 2 cycles.   INTERVAL HISTORY: Rebekah Henderson 63 y.o. female returns to the clinic today for follow-up visit accompanied by her son.  The patient is feeling fine today with no specific complaints.  She tolerated the second cycle of her treatment much better after we reduce the dose of cisplatin and irinotecan.   The patient denied having any nausea or vomiting, no diarrhea or constipation.  She denied having any fever or chills.  She has no weight loss or night sweats.  She denied having any chest pain, shortness of breath, cough or hemoptysis.  She continues to have pain in the right shoulder area but she has a history of cervical degenerative disc disease.  She is here today for evaluation before starting cycle #3 tomorrow.  MEDICAL HISTORY: Past Medical History:  Diagnosis Date  . 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 Anxiety    takes Prozac daily  . Arthritis   . Benign fundic gland polyps of stomach   . Colon polyps   . COPD (chronic obstructive pulmonary disease) ([place])   . Dehydration 03/06/2017  . Diverticulitis   . Dyspnea    with exertion  . Encounter for antineoplastic chemotherapy 12/03/2016  . Fibromyalgia   . GERD (gastroesophageal reflux disease)    takes Pantoprazole daily  . Hypertension    takes Metoprolol,Triamterene-HCTZ,and Amlodipine daily  . Hypothyroidism    takes Synthroid daily  . IBS (irritable bowel syndrome)   . lung ca dx'd 10/02/2016   skin, lung  . PONV (postoperative nausea and vomiting)    pt also states that she had some difficulty breathing after cervical fusion    ALLERGIES:  is allergic to penicillins; codeine; fentanyl; ranitidine hcl; keflex [cephalexin]; and lyrica [pregabalin].  MEDICATIONS:  Current Outpatient Medications  Medication Sig Dispense Refill  . albuterol (PROVENTIL HFA;VENTOLIN  HFA) 108 (90 Base) MCG/ACT inhaler Inhale 2 puffs into the lungs every 4 (four) hours as needed for wheezing or shortness of breath.    . ALPRAZolam (XANAX) 0.25 MG tablet Take 2 tablets (0.5 mg total) by mouth at bedtime as needed for anxiety. 30 tablet 0  . amLODipine (NORVASC) 5 MG tablet Take 5 mg by mouth daily.      Marland Kitchen dicyclomine (BENTYL) 20 MG tablet Take 20 mg by mouth 3 (three) times daily as needed for spasms.    .  diphenoxylate-atropine (LOMOTIL) 2.5-0.025 MG tablet Take 1 tablet by mouth 4 (four) times daily as needed for diarrhea or loose stools (take if immodium does not work). 30 tablet 0  . estazolam (PROSOM) 2 MG tablet Take 2 mg by mouth at bedtime.    Marland Kitchen estradiol (ESTRACE) 2 MG tablet Take 2 mg by mouth daily.      Marland Kitchen FeFum-FePoly-FA-B Cmp-C-Biot (INTEGRA PLUS) CAPS TAKE 1 CAPSULE BY MOUTH ONCE DAILY IN THE MORNING. (Patient taking differently: Take 1 capsule by mouth daily. ) 30 capsule 0  . FLUoxetine (PROZAC) 40 MG capsule Take 40 mg by mouth daily.   2  . HYDROcodone-acetaminophen (NORCO/VICODIN) 5-325 MG tablet Take 1 tablet by mouth every 4 (four) hours as needed for moderate pain.   0  . levothyroxine (SYNTHROID, LEVOTHROID) 50 MCG tablet Take 50 mcg by mouth daily before breakfast.     . lidocaine-prilocaine (EMLA) cream Apply 1 application topically as needed. To numb skin over port a cath: Squeeze a  small amount on cotton ball and place over port site 1-2 hours prior to chemotherapy. 30 g 0  . loperamide (IMODIUM) 2 MG capsule Take 2 mg by mouth every 2 (two) hours as needed for diarrhea or loose stools.    . magnesium oxide (MAG-OX) 400 (241.3 Mg) MG tablet Take 1 tablet (400 mg total) by mouth 2 (two) times daily. 60 tablet 1  . methylPREDNISolone (MEDROL DOSEPAK) 4 MG TBPK tablet Use as instructed. 21 tablet 0  . metoprolol succinate (TOPROL-XL) 25 MG 24 hr tablet Take 25 mg by mouth daily.    . nitrofurantoin, macrocrystal-monohydrate, (MACROBID) 100 MG capsule Take 1 capsule (100 mg total) by mouth 2 (two) times daily. 10 capsule 0  . ondansetron (ZOFRAN-ODT) 4 MG disintegrating tablet Dissolve one tablet by mouth every 8 hours as needed for nausea and vomiting, 20 tablet 1  . pantoprazole (PROTONIX) 40 MG tablet Take 40 mg by mouth 2 (two) times daily before a meal.   10  . promethazine (PHENERGAN) 25 MG tablet TAKE ONE TABLET BY MOUTH EVERY 6 HOURS AS NEEDED. 30 tablet 0  .  senna-docusate (SENOKOT-S) 8.6-50 MG tablet Take 1 tablet by mouth daily as needed.    . tizanidine (ZANAFLEX) 2 MG capsule Take 1 capsule (2 mg total) by mouth 3 (three) times daily as needed for muscle spasms. 30 capsule 0   No current facility-administered medications for this visit.     SURGICAL HISTORY:  Past Surgical History:  Procedure Laterality Date  . BIOPSY N/A 05/25/2013   Procedure: BIOPSIES (Random Colon; Duodenal; Gastric);  Surgeon: Danie Binder, MD;  Location: [facility];  Service: Endoscopy;  Laterality: N/A;  . BLADDER SUSPENSION    . BREAST ENHANCEMENT SURGERY    . BREAST IMPLANT REMOVAL    . CERVICAL FUSION  AUG 2013  . CHOLECYSTECTOMY  1999  . COLONOSCOPY  2007 [place]   POLYPS  . COLONOSCOPY WITH PROPOFOL N/A  05/25/2013   Procedure: COLONOSCOPY WITH PROPOFOL(at cecum 0957) total withdrawal time=26min);  Surgeon: Danie Binder, MD;  Location: [facility];  Service: Endoscopy;  Laterality: N/A;  . ESOPHAGOGASTRODUODENOSCOPY (EGD) WITH PROPOFOL N/A 05/25/2013   Procedure: ESOPHAGOGASTRODUODENOSCOPY (EGD) WITH PROPOFOL;  Surgeon: Danie Binder, MD;  Location: [facility];  Service: Endoscopy;  Laterality: N/A;  . FOOT SURGERY    . IR FLUORO GUIDE PORT INSERTION RIGHT  03/04/2018  . IR US GUIDE VASC ACCESS RIGHT  03/04/2018  . POLYPECTOMY N/A 05/25/2013   Procedure: POLYPECTOMY (Rectal and Gastric);  Surgeon: Danie Binder, MD;  Location: [facility];  Service: Endoscopy;  Laterality: N/A;  . TONSILLECTOMY    . UPPER GASTROINTESTINAL ENDOSCOPY    . VIDEO BRONCHOSCOPY WITH ENDOBRONCHIAL ULTRASOUND  09/12/2016   Procedure: VIDEO BRONCHOSCOPY WITH ENDOBRONCHIAL ULTRASOUND AND BIOPSY;  Surgeon: Juanito Doom, MD;  Location[facility];  Service: Cardiopulmonary;;    REVIEW OF SYSTEMS:  A comprehensive review of systems was negative except for: Constitutional: positive for fatigue Musculoskeletal: positive for arthralgias   PHYSICAL EXAMINATION: General appearance: alert, cooperative,  fatigued and no distress Head: Normocephalic, without obvious abnormality, atraumatic Neck: no adenopathy, no JVD, supple, symmetrical, trachea midline and thyroid not enlarged, symmetric, no tenderness/mass/nodules Lymph nodes: Cervical, supraclavicular, and axillary nodes normal. Resp: clear to auscultation bilaterally Back: symmetric, no curvature. ROM normal. No CVA tenderness. Cardio: regular rate and rhythm, S1, S2 normal, no murmur, click, rub or gallop GI: soft, non-tender; bowel sounds normal; no masses,  no organomegaly Extremities: extremities normal, atraumatic, no cyanosis or edema   ECOG PERFORMANCE STATUS: 1 - Symptomatic but completely ambulatory  Blood pressure 122/70, pulse 73, temperature 97.8 F (36.6 C), temperature source Oral, resp. rate 18, height 5\' 4"  (1.626 m), weight 162 lb 14.4 oz (73.9 kg), SpO2 96 %.  LABORATORY DATA: Lab Results  Component Value Date   WBC 3.9 (L) 12/21/2018   HGB 9.6 (L) 12/21/2018   HCT 28.9 (L) 12/21/2018   MCV 94.1 12/21/2018   PLT 98 (L) 12/21/2018      Chemistry      Component Value Date/Time   NA 138 12/10/2018 0813   NA 141 09/08/2017 1009   K 3.6 12/10/2018 0813   K 3.5 09/08/2017 1009   CL 104 12/10/2018 0813   CO2 23 12/10/2018 0813   CO2 29 09/08/2017 1009   BUN 6 (L) 12/10/2018 0813   BUN 5.8 (L) 09/08/2017 1009   CREATININE 0.68 12/10/2018 0813   CREATININE 0.7 09/08/2017 1009      Component Value Date/Time   CALCIUM 8.4 (L) 12/10/2018 0813   CALCIUM 9.0 09/08/2017 1009   ALKPHOS 47 12/10/2018 0813   ALKPHOS 49 09/08/2017 1009   AST 8 (L) 12/10/2018 0813   AST 9 09/08/2017 1009   ALT <6 12/10/2018 0813   ALT <6 09/08/2017 1009   BILITOT 0.6 12/10/2018 0813   BILITOT 0.51 09/08/2017 1009       RADIOGRAPHIC STUDIES: No results found.  ASSESSMENT AND PLAN:  This is a very pleasant 62 years old white female was limited stage small cell lung cancer, status post 6 cycles of systemic chemotherapy with  carboplatin and etoposide concurrent with radiation and followed by prophylactic cranial irradiation. Patient has been in observation for more than a year.  She has been doing fine but recently started complaining of increasing fatigue and weakness as well as headache. The patient had evidence for disease recurrence and she was a started on  treatment with systemic chemotherapy with carboplatin, etoposide and Tecentriq.  She is status post 6 cycles.  The patient had no evidence for disease progression after the induction phase of her treatment.  She was started on maintenance treatment with Tecentriq Huey Bienenstock) status post 6 cycles.  She has been tolerating this treatment well.   She had evidence for disease progression and the patient was started on systemic chemotherapy with cisplatin and irinotecan on days 1 and 8.  She is tolerating her treatment much better after her dose was reduced to cisplatin 25 mg/M2 and irinotecan 50 mg/M2 on days 1 and 8.  She is status post 2 cycles. I recommended for the patient to proceed with cycle #3 tomorrow. For the hypomagnesemia, will add for gram of magnesium sulfate to her fluid tomorrow. I will see her back for follow-up visit in 3 weeks for evaluation after repeating CT scan of the chest, abdomen and pelvis for restaging of her disease. The patient was advised to call immediately if she has any concerning symptoms in the interval. The patient voices understanding of current disease status and treatment options and is in agreement with the current care plan. All questions were answered. The patient knows to call the clinic with any problems, questions or concerns. We can certainly see the patient much sooner if necessary.   Disclaimer: This note was dictated with voice recognition software. Similar sounding words can inadvertently be transcribed and may not be corrected upon review.

## 2018-12-22 ENCOUNTER — Inpatient Hospital Stay: Payer: Medicare Other

## 2018-12-22 VITALS — BP 111/65 | HR 64 | Temp 97.8°F | Resp 16

## 2018-12-22 DIAGNOSIS — Z5111 Encounter for antineoplastic chemotherapy: Secondary | ICD-10-CM | POA: Diagnosis not present

## 2018-12-22 DIAGNOSIS — M62838 Other muscle spasm: Secondary | ICD-10-CM | POA: Diagnosis not present

## 2018-12-22 DIAGNOSIS — C7951 Secondary malignant neoplasm of bone: Secondary | ICD-10-CM | POA: Diagnosis not present

## 2018-12-22 DIAGNOSIS — Z87891 Personal history of nicotine dependence: Secondary | ICD-10-CM | POA: Diagnosis not present

## 2018-12-22 DIAGNOSIS — J449 Chronic obstructive pulmonary disease, unspecified: Secondary | ICD-10-CM | POA: Diagnosis not present

## 2018-12-22 DIAGNOSIS — E039 Hypothyroidism, unspecified: Secondary | ICD-10-CM | POA: Diagnosis not present

## 2018-12-22 DIAGNOSIS — C3492 Malignant neoplasm of unspecified part of left bronchus or lung: Secondary | ICD-10-CM

## 2018-12-22 DIAGNOSIS — C3412 Malignant neoplasm of upper lobe, left bronchus or lung: Secondary | ICD-10-CM | POA: Diagnosis not present

## 2018-12-22 DIAGNOSIS — Z79899 Other long term (current) drug therapy: Secondary | ICD-10-CM | POA: Diagnosis not present

## 2018-12-22 DIAGNOSIS — I10 Essential (primary) hypertension: Secondary | ICD-10-CM | POA: Diagnosis not present

## 2018-12-22 MED ORDER — PALONOSETRON HCL INJECTION 0.25 MG/5ML
0.2500 mg | Freq: Once | INTRAVENOUS | Status: AC
  Administered 2018-12-22: 0.25 mg via INTRAVENOUS

## 2018-12-22 MED ORDER — SODIUM CHLORIDE 0.9% FLUSH
10.0000 mL | INTRAVENOUS | Status: DC | PRN
  Administered 2018-12-22: 10 mL
  Filled 2018-12-22: qty 10

## 2018-12-22 MED ORDER — IRINOTECAN HCL CHEMO INJECTION 100 MG/5ML
50.0000 mg/m2 | Freq: Once | INTRAVENOUS | Status: AC
  Administered 2018-12-22: 80 mg via INTRAVENOUS
  Filled 2018-12-22: qty 4

## 2018-12-22 MED ORDER — POTASSIUM CHLORIDE 2 MEQ/ML IV SOLN
INTRAVENOUS | Status: DC
  Administered 2018-12-22: 09:00:00 via INTRAVENOUS
  Filled 2018-12-22 (×2): qty 1000

## 2018-12-22 MED ORDER — HEPARIN SOD (PORK) LOCK FLUSH 100 UNIT/ML IV SOLN
500.0000 [IU] | Freq: Once | INTRAVENOUS | Status: AC | PRN
  Administered 2018-12-22: 500 [IU]
  Filled 2018-12-22: qty 5

## 2018-12-22 MED ORDER — PALONOSETRON HCL INJECTION 0.25 MG/5ML
INTRAVENOUS | Status: AC
  Filled 2018-12-22: qty 5

## 2018-12-22 MED ORDER — SODIUM CHLORIDE 0.9 % IV SOLN
Freq: Once | INTRAVENOUS | Status: AC
  Administered 2018-12-22: 11:00:00 via INTRAVENOUS
  Filled 2018-12-22: qty 5

## 2018-12-22 MED ORDER — ATROPINE SULFATE 1 MG/ML IJ SOLN
INTRAMUSCULAR | Status: AC
  Filled 2018-12-22: qty 1

## 2018-12-22 MED ORDER — SODIUM CHLORIDE 0.9 % IV SOLN
25.0000 mg/m2 | Freq: Once | INTRAVENOUS | Status: AC
  Administered 2018-12-22: 45 mg via INTRAVENOUS
  Filled 2018-12-22: qty 45

## 2018-12-22 MED ORDER — SODIUM CHLORIDE 0.9 % IV SOLN
Freq: Once | INTRAVENOUS | Status: AC
  Administered 2018-12-22: 09:00:00 via INTRAVENOUS
  Filled 2018-12-22: qty 250

## 2018-12-22 MED ORDER — ATROPINE SULFATE 1 MG/ML IJ SOLN
0.5000 mg | Freq: Once | INTRAMUSCULAR | Status: AC | PRN
  Administered 2018-12-22: 0.5 mg via INTRAVENOUS

## 2018-12-22 NOTE — Patient Instructions (Signed)
[facility] Discharge Instructions for Patients Receiving Chemotherapy  Today you received the following chemotherapy agents Cisplatin and Irinotecan  To help prevent nausea and vomiting after your treatment, we encourage you to take your nausea medication as directed.    If you develop nausea and vomiting that is not controlled by your nausea medication, call the clinic.   BELOW ARE SYMPTOMS THAT SHOULD BE REPORTED IMMEDIATELY:  *FEVER GREATER THAN 100.5 F  *CHILLS WITH OR WITHOUT FEVER  NAUSEA AND VOMITING THAT IS NOT CONTROLLED WITH YOUR NAUSEA MEDICATION  *UNUSUAL SHORTNESS OF BREATH  *UNUSUAL BRUISING OR BLEEDING  TENDERNESS IN MOUTH AND THROAT WITH OR WITHOUT PRESENCE OF ULCERS  *URINARY PROBLEMS  *BOWEL PROBLEMS  UNUSUAL RASH Items with * indicate a potential emergency and should be followed up as soon as possible.  Feel free to call the clinic should you have any questions or concerns. The clinic phone number is [phone].  Please show the [facility] at check-in to the Emergency Department and triage nurse.

## 2018-12-29 ENCOUNTER — Inpatient Hospital Stay: Payer: Medicare Other

## 2018-12-29 VITALS — BP 120/70 | HR 71 | Temp 97.8°F | Resp 16

## 2018-12-29 DIAGNOSIS — Z87891 Personal history of nicotine dependence: Secondary | ICD-10-CM | POA: Diagnosis not present

## 2018-12-29 DIAGNOSIS — M62838 Other muscle spasm: Secondary | ICD-10-CM | POA: Diagnosis not present

## 2018-12-29 DIAGNOSIS — C3492 Malignant neoplasm of unspecified part of left bronchus or lung: Secondary | ICD-10-CM

## 2018-12-29 DIAGNOSIS — C3412 Malignant neoplasm of upper lobe, left bronchus or lung: Secondary | ICD-10-CM | POA: Diagnosis not present

## 2018-12-29 DIAGNOSIS — E039 Hypothyroidism, unspecified: Secondary | ICD-10-CM | POA: Diagnosis not present

## 2018-12-29 DIAGNOSIS — C7951 Secondary malignant neoplasm of bone: Secondary | ICD-10-CM | POA: Diagnosis not present

## 2018-12-29 DIAGNOSIS — Z5111 Encounter for antineoplastic chemotherapy: Secondary | ICD-10-CM | POA: Diagnosis not present

## 2018-12-29 DIAGNOSIS — J449 Chronic obstructive pulmonary disease, unspecified: Secondary | ICD-10-CM | POA: Diagnosis not present

## 2018-12-29 DIAGNOSIS — Z79899 Other long term (current) drug therapy: Secondary | ICD-10-CM | POA: Diagnosis not present

## 2018-12-29 DIAGNOSIS — I10 Essential (primary) hypertension: Secondary | ICD-10-CM | POA: Diagnosis not present

## 2018-12-29 LAB — CBC WITH DIFFERENTIAL (CANCER CENTER ONLY)
ABS IMMATURE GRANULOCYTES: 0.05 10*3/uL (ref 0.00–0.07)
Basophils Absolute: 0 10*3/uL (ref 0.0–0.1)
Basophils Relative: 0 %
Eosinophils Absolute: 0.1 10*3/uL (ref 0.0–0.5)
Eosinophils Relative: 4 %
HCT: 29.5 % — ABNORMAL LOW (ref 36.0–46.0)
Hemoglobin: 9.9 g/dL — ABNORMAL LOW (ref 12.0–15.0)
Immature Granulocytes: 1 %
Lymphocytes Relative: 33 %
Lymphs Abs: 1.2 10*3/uL (ref 0.7–4.0)
MCH: 31.4 pg (ref 26.0–34.0)
MCHC: 33.6 g/dL (ref 30.0–36.0)
MCV: 93.7 fL (ref 80.0–100.0)
Monocytes Absolute: 0.4 10*3/uL (ref 0.1–1.0)
Monocytes Relative: 10 %
Neutro Abs: 1.9 10*3/uL (ref 1.7–7.7)
Neutrophils Relative %: 52 %
Platelet Count: 94 10*3/uL — ABNORMAL LOW (ref 150–400)
RBC: 3.15 MIL/uL — ABNORMAL LOW (ref 3.87–5.11)
RDW: 17.2 % — ABNORMAL HIGH (ref 11.5–15.5)
WBC Count: 3.7 10*3/uL — ABNORMAL LOW (ref 4.0–10.5)
nRBC: 0.5 % — ABNORMAL HIGH (ref 0.0–0.2)

## 2018-12-29 LAB — CMP (CANCER CENTER ONLY)
ALK PHOS: 51 U/L (ref 38–126)
ALT: 8 U/L (ref 0–44)
AST: 8 U/L — ABNORMAL LOW (ref 15–41)
Albumin: 3.4 g/dL — ABNORMAL LOW (ref 3.5–5.0)
Anion gap: 9 (ref 5–15)
BUN: 7 mg/dL — ABNORMAL LOW (ref 8–23)
CALCIUM: 8.6 mg/dL — AB (ref 8.9–10.3)
CO2: 26 mmol/L (ref 22–32)
Chloride: 105 mmol/L (ref 98–111)
Creatinine: 0.71 mg/dL (ref 0.44–1.00)
GFR, Est AFR Am: >60 mL/min (ref >60)
GFR, Estimated: >60 mL/min (ref >60)
Glucose, Bld: 87 mg/dL (ref 70–99)
Potassium: 3.9 mmol/L (ref 3.5–5.1)
Sodium: 140 mmol/L (ref 135–145)
Total Bilirubin: 0.5 mg/dL (ref 0.3–1.2)
Total Protein: 6.3 g/dL — ABNORMAL LOW (ref 6.5–8.1)

## 2018-12-29 LAB — MAGNESIUM: Magnesium: 1.2 mg/dL — CL (ref 1.7–2.4)

## 2018-12-29 MED ORDER — SODIUM CHLORIDE 0.9% FLUSH
10.0000 mL | INTRAVENOUS | Status: DC | PRN
  Administered 2018-12-29: 10 mL
  Filled 2018-12-29: qty 10

## 2018-12-29 MED ORDER — ATROPINE SULFATE 1 MG/ML IJ SOLN
0.5000 mg | Freq: Once | INTRAMUSCULAR | Status: AC | PRN
  Administered 2018-12-29: 0.5 mg via INTRAVENOUS

## 2018-12-29 MED ORDER — PALONOSETRON HCL INJECTION 0.25 MG/5ML
0.2500 mg | Freq: Once | INTRAVENOUS | Status: AC
  Administered 2018-12-29: 0.25 mg via INTRAVENOUS

## 2018-12-29 MED ORDER — HEPARIN SOD (PORK) LOCK FLUSH 100 UNIT/ML IV SOLN
500.0000 [IU] | Freq: Once | INTRAVENOUS | Status: AC | PRN
  Administered 2018-12-29: 500 [IU]
  Filled 2018-12-29: qty 5

## 2018-12-29 MED ORDER — SODIUM CHLORIDE 0.9 % IV SOLN
Freq: Once | INTRAVENOUS | Status: AC
  Administered 2018-12-29: 10:00:00 via INTRAVENOUS
  Filled 2018-12-29: qty 250

## 2018-12-29 MED ORDER — SODIUM CHLORIDE 0.9 % IV SOLN
25.0000 mg/m2 | Freq: Once | INTRAVENOUS | Status: AC
  Administered 2018-12-29: 45 mg via INTRAVENOUS
  Filled 2018-12-29: qty 45

## 2018-12-29 MED ORDER — SODIUM CHLORIDE 0.9 % IV SOLN
Freq: Once | INTRAVENOUS | Status: AC
  Administered 2018-12-29: 13:00:00 via INTRAVENOUS
  Filled 2018-12-29: qty 5

## 2018-12-29 MED ORDER — ATROPINE SULFATE 1 MG/ML IJ SOLN
INTRAMUSCULAR | Status: AC
  Filled 2018-12-29: qty 1

## 2018-12-29 MED ORDER — IRINOTECAN HCL CHEMO INJECTION 100 MG/5ML
50.0000 mg/m2 | Freq: Once | INTRAVENOUS | Status: AC
  Administered 2018-12-29: 80 mg via INTRAVENOUS
  Filled 2018-12-29: qty 4

## 2018-12-29 MED ORDER — PALONOSETRON HCL INJECTION 0.25 MG/5ML
INTRAVENOUS | Status: AC
  Filled 2018-12-29: qty 5

## 2018-12-29 MED ORDER — POTASSIUM CHLORIDE 2 MEQ/ML IV SOLN
Freq: Once | INTRAVENOUS | Status: AC
  Administered 2018-12-29: 10:00:00 via INTRAVENOUS
  Filled 2018-12-29: qty 10

## 2018-12-29 NOTE — Patient Instructions (Signed)
[facility] Discharge Instructions for Patients Receiving Chemotherapy  Today you received the following chemotherapy agents Cisplatin and Irinotecan  To help prevent nausea and vomiting after your treatment, we encourage you to take your nausea medication as directed.    If you develop nausea and vomiting that is not controlled by your nausea medication, call the clinic.   BELOW ARE SYMPTOMS THAT SHOULD BE REPORTED IMMEDIATELY:  *FEVER GREATER THAN 100.5 F  *CHILLS WITH OR WITHOUT FEVER  NAUSEA AND VOMITING THAT IS NOT CONTROLLED WITH YOUR NAUSEA MEDICATION  *UNUSUAL SHORTNESS OF BREATH  *UNUSUAL BRUISING OR BLEEDING  TENDERNESS IN MOUTH AND THROAT WITH OR WITHOUT PRESENCE OF ULCERS  *URINARY PROBLEMS  *BOWEL PROBLEMS  UNUSUAL RASH Items with * indicate a potential emergency and should be followed up as soon as possible.  Feel free to call the clinic should you have any questions or concerns. The clinic phone number is [phone].  Please show the [facility] at check-in to the Emergency Department and triage nurse.

## 2018-12-29 NOTE — Patient Instructions (Signed)

## 2018-12-29 NOTE — Progress Notes (Signed)
Cbc, cmet and Mag reviewed with MD, ok to treat despite counts. VO from MD add mag. 2-3 grams to hydration fluids. Pharmacy and infusion RN notified.

## 2019-01-01 DIAGNOSIS — J449 Chronic obstructive pulmonary disease, unspecified: Secondary | ICD-10-CM | POA: Diagnosis not present

## 2019-01-01 DIAGNOSIS — R0902 Hypoxemia: Secondary | ICD-10-CM | POA: Diagnosis not present

## 2019-01-04 ENCOUNTER — Other Ambulatory Visit: Payer: Self-pay | Admitting: Medical

## 2019-01-04 NOTE — Telephone Encounter (Signed)
Forwarding request to primary oncologist Dr. Julien Nordmann for review.

## 2019-01-05 ENCOUNTER — Other Ambulatory Visit: Payer: Self-pay | Admitting: Medical Oncology

## 2019-01-05 ENCOUNTER — Telehealth: Payer: Self-pay | Admitting: Medical Oncology

## 2019-01-05 DIAGNOSIS — R197 Diarrhea, unspecified: Secondary | ICD-10-CM

## 2019-01-05 MED ORDER — DIPHENOXYLATE-ATROPINE 2.5-0.025 MG PO TABS: 1.0000 | ORAL_TABLET | Freq: Four times a day (QID) | ORAL | 0 refills | Status: DC | PRN

## 2019-01-05 NOTE — Telephone Encounter (Signed)
medication management- Per [employer], the flexaril was prescribed by Dr Maudie Mercury  and zanaflex by Sandi Mealy ,PA.- both filled on 1/9. Pt is only taking flexaril because she did not know what the zanaflex was for.  Pt requests refill for flexaril because it is working . I instructed pt to take zanaflex. Called this to pt via VM.

## 2019-01-05 NOTE — Telephone Encounter (Signed)
Requests refill on flexaril. She has a full bottle of zanaflex . She said she got flexaril and zanaflex the same day 1/9.

## 2019-01-05 NOTE — Telephone Encounter (Signed)
Called in refill for lomotil.

## 2019-01-06 ENCOUNTER — Inpatient Hospital Stay (HOSPITAL_COMMUNITY)
Admission: EM | Admit: 2019-01-06 | Discharge: 2019-01-08 | DRG: 391 | Disposition: A | Discharge status: Home / Self Care | Payer: Medicare Other | Attending: Internal Medicine | Admitting: Internal Medicine

## 2019-01-06 ENCOUNTER — Emergency Department (HOSPITAL_COMMUNITY): Payer: Medicare Other

## 2019-01-06 ENCOUNTER — Encounter (HOSPITAL_COMMUNITY): Payer: Self-pay

## 2019-01-06 ENCOUNTER — Other Ambulatory Visit: Payer: Self-pay

## 2019-01-06 ENCOUNTER — Telehealth: Payer: Self-pay | Admitting: Medical Oncology

## 2019-01-06 DIAGNOSIS — D6481 Anemia due to antineoplastic chemotherapy: Secondary | ICD-10-CM | POA: Diagnosis not present

## 2019-01-06 DIAGNOSIS — Z8349 Family history of other endocrine, nutritional and metabolic diseases: Secondary | ICD-10-CM

## 2019-01-06 DIAGNOSIS — I959 Hypotension, unspecified: Secondary | ICD-10-CM | POA: Diagnosis not present

## 2019-01-06 DIAGNOSIS — D6959 Other secondary thrombocytopenia: Secondary | ICD-10-CM | POA: Diagnosis present

## 2019-01-06 DIAGNOSIS — Z881 Allergy status to other antibiotic agents status: Secondary | ICD-10-CM

## 2019-01-06 DIAGNOSIS — K219 Gastro-esophageal reflux disease without esophagitis: Secondary | ICD-10-CM | POA: Diagnosis present

## 2019-01-06 DIAGNOSIS — Z888 Allergy status to other drugs, medicaments and biological substances status: Secondary | ICD-10-CM

## 2019-01-06 DIAGNOSIS — Z885 Allergy status to narcotic agent status: Secondary | ICD-10-CM

## 2019-01-06 DIAGNOSIS — D649 Anemia, unspecified: Secondary | ICD-10-CM

## 2019-01-06 DIAGNOSIS — D61810 Antineoplastic chemotherapy induced pancytopenia: Secondary | ICD-10-CM | POA: Diagnosis present

## 2019-01-06 DIAGNOSIS — T451X5A Adverse effect of antineoplastic and immunosuppressive drugs, initial encounter: Secondary | ICD-10-CM | POA: Diagnosis present

## 2019-01-06 DIAGNOSIS — E860 Dehydration: Secondary | ICD-10-CM | POA: Diagnosis present

## 2019-01-06 DIAGNOSIS — Z9981 Dependence on supplemental oxygen: Secondary | ICD-10-CM

## 2019-01-06 DIAGNOSIS — R109 Unspecified abdominal pain: Secondary | ICD-10-CM | POA: Diagnosis not present

## 2019-01-06 DIAGNOSIS — D696 Thrombocytopenia, unspecified: Secondary | ICD-10-CM | POA: Diagnosis not present

## 2019-01-06 DIAGNOSIS — C787 Secondary malignant neoplasm of liver and intrahepatic bile duct: Secondary | ICD-10-CM | POA: Diagnosis present

## 2019-01-06 DIAGNOSIS — Z8249 Family history of ischemic heart disease and other diseases of the circulatory system: Secondary | ICD-10-CM

## 2019-01-06 DIAGNOSIS — F419 Anxiety disorder, unspecified: Secondary | ICD-10-CM | POA: Diagnosis present

## 2019-01-06 DIAGNOSIS — Z79890 Hormone replacement therapy (postmenopausal): Secondary | ICD-10-CM

## 2019-01-06 DIAGNOSIS — Z833 Family history of diabetes mellitus: Secondary | ICD-10-CM

## 2019-01-06 DIAGNOSIS — J449 Chronic obstructive pulmonary disease, unspecified: Secondary | ICD-10-CM | POA: Diagnosis present

## 2019-01-06 DIAGNOSIS — N390 Urinary tract infection, site not specified: Secondary | ICD-10-CM | POA: Diagnosis not present

## 2019-01-06 DIAGNOSIS — M797 Fibromyalgia: Secondary | ICD-10-CM | POA: Diagnosis present

## 2019-01-06 DIAGNOSIS — R1084 Generalized abdominal pain: Secondary | ICD-10-CM | POA: Diagnosis not present

## 2019-01-06 DIAGNOSIS — Z8619 Personal history of other infectious and parasitic diseases: Secondary | ICD-10-CM

## 2019-01-06 DIAGNOSIS — Z1612 Extended spectrum beta lactamase (ESBL) resistance: Secondary | ICD-10-CM | POA: Diagnosis present

## 2019-01-06 DIAGNOSIS — Z79891 Long term (current) use of opiate analgesic: Secondary | ICD-10-CM

## 2019-01-06 DIAGNOSIS — R112 Nausea with vomiting, unspecified: Secondary | ICD-10-CM

## 2019-01-06 DIAGNOSIS — Z95828 Presence of other vascular implants and grafts: Secondary | ICD-10-CM | POA: Diagnosis not present

## 2019-01-06 DIAGNOSIS — E039 Hypothyroidism, unspecified: Secondary | ICD-10-CM | POA: Diagnosis present

## 2019-01-06 DIAGNOSIS — B9629 Other Escherichia coli [E. coli] as the cause of diseases classified elsewhere: Secondary | ICD-10-CM | POA: Diagnosis not present

## 2019-01-06 DIAGNOSIS — I10 Essential (primary) hypertension: Secondary | ICD-10-CM | POA: Diagnosis present

## 2019-01-06 DIAGNOSIS — A084 Viral intestinal infection, unspecified: Secondary | ICD-10-CM | POA: Diagnosis not present

## 2019-01-06 DIAGNOSIS — C3490 Malignant neoplasm of unspecified part of unspecified bronchus or lung: Secondary | ICD-10-CM | POA: Diagnosis not present

## 2019-01-06 DIAGNOSIS — K529 Noninfective gastroenteritis and colitis, unspecified: Secondary | ICD-10-CM

## 2019-01-06 DIAGNOSIS — C3412 Malignant neoplasm of upper lobe, left bronchus or lung: Secondary | ICD-10-CM | POA: Diagnosis not present

## 2019-01-06 DIAGNOSIS — K5730 Diverticulosis of large intestine without perforation or abscess without bleeding: Secondary | ICD-10-CM | POA: Diagnosis not present

## 2019-01-06 DIAGNOSIS — Z86010 Personal history of colonic polyps: Secondary | ICD-10-CM

## 2019-01-06 DIAGNOSIS — C3492 Malignant neoplasm of unspecified part of left bronchus or lung: Secondary | ICD-10-CM | POA: Diagnosis not present

## 2019-01-06 DIAGNOSIS — R000 Tachycardia, unspecified: Secondary | ICD-10-CM | POA: Diagnosis present

## 2019-01-06 DIAGNOSIS — K589 Irritable bowel syndrome without diarrhea: Secondary | ICD-10-CM | POA: Diagnosis present

## 2019-01-06 DIAGNOSIS — Z801 Family history of malignant neoplasm of trachea, bronchus and lung: Secondary | ICD-10-CM

## 2019-01-06 DIAGNOSIS — Z79899 Other long term (current) drug therapy: Secondary | ICD-10-CM

## 2019-01-06 DIAGNOSIS — B9620 Unspecified Escherichia coli [E. coli] as the cause of diseases classified elsewhere: Secondary | ICD-10-CM | POA: Diagnosis not present

## 2019-01-06 DIAGNOSIS — J9611 Chronic respiratory failure with hypoxia: Secondary | ICD-10-CM | POA: Diagnosis present

## 2019-01-06 DIAGNOSIS — Z85828 Personal history of other malignant neoplasm of skin: Secondary | ICD-10-CM

## 2019-01-06 DIAGNOSIS — Z9089 Acquired absence of other organs: Secondary | ICD-10-CM

## 2019-01-06 DIAGNOSIS — E876 Hypokalemia: Secondary | ICD-10-CM | POA: Diagnosis not present

## 2019-01-06 DIAGNOSIS — Z981 Arthrodesis status: Secondary | ICD-10-CM

## 2019-01-06 DIAGNOSIS — Z87891 Personal history of nicotine dependence: Secondary | ICD-10-CM

## 2019-01-06 DIAGNOSIS — R197 Diarrhea, unspecified: Secondary | ICD-10-CM

## 2019-01-06 DIAGNOSIS — Z803 Family history of malignant neoplasm of breast: Secondary | ICD-10-CM

## 2019-01-06 DIAGNOSIS — Z880 Allergy status to penicillin: Secondary | ICD-10-CM

## 2019-01-06 DIAGNOSIS — R110 Nausea: Secondary | ICD-10-CM | POA: Diagnosis not present

## 2019-01-06 DIAGNOSIS — Z9049 Acquired absence of other specified parts of digestive tract: Secondary | ICD-10-CM

## 2019-01-06 DIAGNOSIS — R52 Pain, unspecified: Secondary | ICD-10-CM | POA: Diagnosis not present

## 2019-01-06 LAB — URINALYSIS, ROUTINE W REFLEX MICROSCOPIC
Bilirubin Urine: NEGATIVE
Glucose, UA: NEGATIVE mg/dL
Ketones, ur: NEGATIVE mg/dL
Nitrite: POSITIVE — AB
Protein, ur: NEGATIVE mg/dL
SPECIFIC GRAVITY, URINE: 1.003 — AB (ref 1.005–1.030)
pH: 7 (ref 5.0–8.0)

## 2019-01-06 LAB — CBC WITH DIFFERENTIAL/PLATELET
ABS IMMATURE GRANULOCYTES: 0.02 10*3/uL (ref 0.00–0.07)
BASOS PCT: 0 %
Basophils Absolute: 0 10*3/uL (ref 0.0–0.1)
Eosinophils Absolute: 0.1 10*3/uL (ref 0.0–0.5)
Eosinophils Relative: 2 %
HCT: 24.5 % — ABNORMAL LOW (ref 36.0–46.0)
Hemoglobin: 7.9 g/dL — ABNORMAL LOW (ref 12.0–15.0)
Immature Granulocytes: 1 %
Lymphocytes Relative: 29 %
Lymphs Abs: 1 10*3/uL (ref 0.7–4.0)
MCH: 31.3 pg (ref 26.0–34.0)
MCHC: 32.2 g/dL (ref 30.0–36.0)
MCV: 97.2 fL (ref 80.0–100.0)
MONOS PCT: 12 %
Monocytes Absolute: 0.4 10*3/uL (ref 0.1–1.0)
Neutro Abs: 2 10*3/uL (ref 1.7–7.7)
Neutrophils Relative %: 56 %
Platelets: 82 10*3/uL — ABNORMAL LOW (ref 150–400)
RBC: 2.52 MIL/uL — ABNORMAL LOW (ref 3.87–5.11)
RDW: 18.6 % — ABNORMAL HIGH (ref 11.5–15.5)
WBC: 3.6 10*3/uL — ABNORMAL LOW (ref 4.0–10.5)
nRBC: 0 % (ref 0.0–0.2)

## 2019-01-06 LAB — TROPONIN I: Troponin I: <0.03 ng/mL (ref <0.03)

## 2019-01-06 LAB — MAGNESIUM: Magnesium: 1.1 mg/dL — ABNORMAL LOW (ref 1.7–2.4)

## 2019-01-06 LAB — COMPREHENSIVE METABOLIC PANEL
ALT: 8 U/L (ref 0–44)
AST: 12 U/L — ABNORMAL LOW (ref 15–41)
Albumin: 3.2 g/dL — ABNORMAL LOW (ref 3.5–5.0)
Alkaline Phosphatase: 35 U/L — ABNORMAL LOW (ref 38–126)
Anion gap: 7 (ref 5–15)
BUN: 9 mg/dL (ref 8–23)
CO2: 23 mmol/L (ref 22–32)
CREATININE: 0.67 mg/dL (ref 0.44–1.00)
Calcium: 7.1 mg/dL — ABNORMAL LOW (ref 8.9–10.3)
Chloride: 108 mmol/L (ref 98–111)
GFR calc Af Amer: >60 mL/min (ref >60)
GFR calc non Af Amer: >60 mL/min (ref >60)
Glucose, Bld: 95 mg/dL (ref 70–99)
Potassium: 3.2 mmol/L — ABNORMAL LOW (ref 3.5–5.1)
Sodium: 138 mmol/L (ref 135–145)
Total Bilirubin: 0.7 mg/dL (ref 0.3–1.2)
Total Protein: 5.3 g/dL — ABNORMAL LOW (ref 6.5–8.1)

## 2019-01-06 LAB — TYPE AND SCREEN
ABO/RH(D): O POS
Antibody Screen: NEGATIVE

## 2019-01-06 LAB — LACTIC ACID, PLASMA
Lactic Acid, Venous: 1.3 mmol/L (ref 0.5–1.9)
Lactic Acid, Venous: 0.8 mmol/L (ref 0.5–1.9)

## 2019-01-06 LAB — LIPASE, BLOOD: Lipase: 21 U/L (ref 11–51)

## 2019-01-06 MED ORDER — METRONIDAZOLE IN NACL 5-0.79 MG/ML-% IV SOLN
500.0000 mg | Freq: Once | INTRAVENOUS | Status: DC
  Administered 2019-01-06: 500 mg via INTRAVENOUS
  Filled 2019-01-06: qty 100

## 2019-01-06 MED ORDER — ACETAMINOPHEN 650 MG RE SUPP: 650.0000 mg | Freq: Four times a day (QID) | RECTAL | Status: DC | PRN

## 2019-01-06 MED ORDER — SODIUM CHLORIDE 0.9% FLUSH
3.0000 mL | Freq: Two times a day (BID) | INTRAVENOUS | Status: DC
  Administered 2019-01-07 – 2019-01-08 (×3): 3 mL via INTRAVENOUS

## 2019-01-06 MED ORDER — SODIUM CHLORIDE 0.9 % IV BOLUS
1000.0000 mL | Freq: Once | INTRAVENOUS | Status: AC
  Administered 2019-01-06: 1000 mL via INTRAVENOUS

## 2019-01-06 MED ORDER — POTASSIUM CHLORIDE 20 MEQ/15ML (10%) PO SOLN: 40.0000 meq | Freq: Once | ORAL | Status: DC

## 2019-01-06 MED ORDER — PANTOPRAZOLE SODIUM 40 MG PO TBEC
40.0000 mg | DELAYED_RELEASE_TABLET | Freq: Two times a day (BID) | ORAL | Status: DC
  Administered 2019-01-06 – 2019-01-08 (×5): 40 mg via ORAL
  Filled 2019-01-06 (×5): qty 1

## 2019-01-06 MED ORDER — MAGNESIUM SULFATE 2 GM/50ML IV SOLN
2.0000 g | Freq: Once | INTRAVENOUS | Status: AC
  Administered 2019-01-06: 2 g via INTRAVENOUS
  Filled 2019-01-06: qty 50

## 2019-01-06 MED ORDER — HYDROCODONE-ACETAMINOPHEN 5-325 MG PO TABS
1.0000 | ORAL_TABLET | ORAL | Status: DC | PRN
  Administered 2019-01-06 – 2019-01-07 (×5): 1 via ORAL
  Filled 2019-01-06 (×5): qty 1

## 2019-01-06 MED ORDER — LEVOTHYROXINE SODIUM 50 MCG PO TABS
50.0000 ug | ORAL_TABLET | Freq: Every day | ORAL | Status: DC
  Administered 2019-01-07 – 2019-01-08 (×2): 50 ug via ORAL
  Filled 2019-01-06 (×2): qty 1

## 2019-01-06 MED ORDER — IOHEXOL 300 MG/ML  SOLN
100.0000 mL | Freq: Once | INTRAMUSCULAR | Status: AC | PRN
  Administered 2019-01-06: 100 mL via INTRAVENOUS

## 2019-01-06 MED ORDER — SODIUM CHLORIDE 0.9 % IV SOLN
INTRAVENOUS | Status: DC
  Administered 2019-01-06: 15:00:00 via INTRAVENOUS

## 2019-01-06 MED ORDER — POTASSIUM CHLORIDE IN NACL 20-0.9 MEQ/L-% IV SOLN
INTRAVENOUS | Status: AC
  Administered 2019-01-06 – 2019-01-07 (×2): via INTRAVENOUS
  Filled 2019-01-06: qty 1000

## 2019-01-06 MED ORDER — ALPRAZOLAM 0.5 MG PO TABS
0.5000 mg | ORAL_TABLET | Freq: Every evening | ORAL | Status: DC | PRN
  Administered 2019-01-07 (×2): 0.5 mg via ORAL
  Filled 2019-01-06 (×2): qty 1

## 2019-01-06 MED ORDER — FLUOXETINE HCL 20 MG PO CAPS
40.0000 mg | ORAL_CAPSULE | Freq: Every day | ORAL | Status: DC
  Administered 2019-01-06 – 2019-01-08 (×3): 40 mg via ORAL
  Filled 2019-01-06 (×3): qty 2

## 2019-01-06 MED ORDER — CIPROFLOXACIN IN D5W 400 MG/200ML IV SOLN
400.0000 mg | Freq: Once | INTRAVENOUS | Status: DC
  Filled 2019-01-06: qty 200

## 2019-01-06 MED ORDER — ACETAMINOPHEN 325 MG PO TABS
650.0000 mg | ORAL_TABLET | Freq: Four times a day (QID) | ORAL | Status: DC | PRN
  Administered 2019-01-06 – 2019-01-07 (×2): 650 mg via ORAL
  Filled 2019-01-06 (×2): qty 2

## 2019-01-06 MED ORDER — ONDANSETRON HCL 4 MG/2ML IJ SOLN: 4.0000 mg | Freq: Four times a day (QID) | INTRAMUSCULAR | Status: DC | PRN

## 2019-01-06 MED ORDER — ALBUTEROL SULFATE (2.5 MG/3ML) 0.083% IN NEBU: 2.5000 mg | INHALATION_SOLUTION | RESPIRATORY_TRACT | Status: DC | PRN

## 2019-01-06 MED ORDER — POTASSIUM CHLORIDE 10 MEQ/100ML IV SOLN
10.0000 meq | INTRAVENOUS | Status: AC
  Administered 2019-01-06 (×2): 10 meq via INTRAVENOUS
  Filled 2019-01-06 (×2): qty 100

## 2019-01-06 MED ORDER — IPRATROPIUM-ALBUTEROL 0.5-2.5 (3) MG/3ML IN SOLN
3.0000 mL | Freq: Four times a day (QID) | RESPIRATORY_TRACT | Status: DC
  Administered 2019-01-06 – 2019-01-08 (×9): 3 mL via RESPIRATORY_TRACT
  Filled 2019-01-06 (×10): qty 3

## 2019-01-06 MED ORDER — SODIUM CHLORIDE 0.9 % IV SOLN
1.0000 g | Freq: Three times a day (TID) | INTRAVENOUS | Status: DC
  Administered 2019-01-06 – 2019-01-08 (×7): 1 g via INTRAVENOUS
  Filled 2019-01-06 (×16): qty 1

## 2019-01-06 MED ORDER — ONDANSETRON HCL 4 MG PO TABS: 4.0000 mg | ORAL_TABLET | Freq: Four times a day (QID) | ORAL | Status: DC | PRN

## 2019-01-06 NOTE — ED Triage Notes (Signed)
Pt reports n/v/d and abd pain for past few days.  EMS reports pt was hypotensive, 41'L systolic.  EMS gave 576ml bolus NSS.  Pt being treated for lung cancer and had last chemo treatment on jan 28.

## 2019-01-06 NOTE — ED Notes (Signed)
Per pt she took AZO this morning and unable to give a urine sample at this time. MD notified and bladder scan done.

## 2019-01-06 NOTE — Telephone Encounter (Signed)
Admitted to ED Rebekah Henderson-abdominal pain -brought by EMS.

## 2019-01-06 NOTE — ED Provider Notes (Signed)
[facility] EMERGENCY DEPARTMENT Provider Note   CSN: [id number] Arrival date & time: 01/06/19  1053     History   Chief Complaint Chief Complaint  Patient presents with  . Abdominal Pain    HPI Rebekah Henderson is a 63 y.o. female.  HPI  Pt was seen at 1100. Per pt, c/o gradual onset and persistence of multiple intermittent episodes of N/V/D that began 3 days ago.   Describes the stools as "watery." Has been associated with left sided abd "pains."  Hx Lung CA with LD chemo 12/29/18.  EMS states pt's SBP "80's" on their arrival to scene, gave IV NS 540ml bolus en route. Denies CP/SOB, no back pain, no fevers, no black or blood in stools or emesis.    Past Medical History:  Diagnosis Date  . 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 Anxiety    takes Prozac daily  . Arthritis   . Benign fundic gland polyps of stomach   . Colon polyps   . COPD (chronic obstructive pulmonary disease) ([place])   . Dehydration 03/06/2017  . Diverticulitis   . Dyspnea    with exertion  . Encounter for antineoplastic chemotherapy 12/03/2016  . Fibromyalgia   . GERD (gastroesophageal reflux disease)    takes Pantoprazole daily  . Hypertension    takes Metoprolol,Triamterene-HCTZ,and Amlodipine daily  . Hypothyroidism    takes Synthroid daily  . IBS (irritable bowel syndrome)   . lung ca dx'd 10/02/2016   skin, lung  . PONV (postoperative nausea and vomiting)    pt also states that she had some difficulty breathing after cervical fusion    Patient Active Problem List   Diagnosis Date Noted  . Goals of care, counseling/discussion 06/30/2018  . Encounter for antineoplastic immunotherapy 03/10/2018  . 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 Acute on chronic respiratory failure with hypoxia ([place]) 03/21/2017  . Mild protein-calorie malnutrition ([place]) 03/21/2017  . HCAP (healthcare-associated pneumonia) 03/20/2017  . Dehydration 03/06/2017  . Symptomatic anemia  01/04/2017  . Thrombocytopenia ([place]) 01/04/2017  . Influenza A 12/10/2016  . SIRS (systemic inflammatory response syndrome) ([place]) 12/07/2016  . Hypoxia 12/07/2016  . Bronchitis 12/07/2016  . Encounter for antineoplastic chemotherapy 12/03/2016  . 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 Protein-calorie malnutrition, severe 10/30/2016  . Vomiting 10/29/2016  . Abdominal pain 10/29/2016  . Rash and nonspecific skin eruption 10/29/2016  . Neutropenic fever ([place]) 10/15/2016  . Intractable nausea and vomiting 10/15/2016  . Pancytopenia due to antineoplastic chemotherapy ([place]) 10/15/2016  . Nodular rash 10/15/2016  . Hypokalemia 10/15/2016  . Hypomagnesemia 10/15/2016  . Chronic respiratory failure with hypoxia ([place]) 10/15/2016  . Small cell lung cancer, left ([place]) 09/19/2016  . Mediastinal mass 09/10/2016  . COPD (chronic obstructive pulmonary disease) ([place])   . Hypertension   . Fibromyalgia   . IBS (irritable bowel syndrome)   . GERD (gastroesophageal reflux disease)   . Hypothyroidism   . Colon polyps   . Anxiety   . PONV (postoperative nausea and vomiting)   . Diarrhea 05/12/2013    Past Surgical History:  Procedure Laterality Date  . BIOPSY N/A 05/25/2013   Procedure: BIOPSIES (Random Colon; Duodenal; Gastric);  Surgeon: Danie Binder, MD;  Location: [facility];  Service: Endoscopy;  Laterality: N/A;  . BLADDER SUSPENSION    . BREAST ENHANCEMENT SURGERY    . BREAST IMPLANT REMOVAL    . CERVICAL FUSION  AUG 2013  . CHOLECYSTECTOMY  1999  . COLONOSCOPY  2007 [place]   POLYPS  . COLONOSCOPY WITH PROPOFOL N/A 05/25/2013   Procedure: COLONOSCOPY WITH PROPOFOL(at cecum 0957) total withdrawal time=31min);  Surgeon: Danie Binder, MD;  Location: [facility];  Service: Endoscopy;  Laterality: N/A;  . ESOPHAGOGASTRODUODENOSCOPY (EGD) WITH PROPOFOL N/A 05/25/2013   Procedure: ESOPHAGOGASTRODUODENOSCOPY (EGD) WITH PROPOFOL;  Surgeon: Danie Binder, MD;  Location: [facility];   Service: Endoscopy;  Laterality: N/A;  . FOOT SURGERY    . IR FLUORO GUIDE PORT INSERTION RIGHT  03/04/2018  . IR US GUIDE VASC ACCESS RIGHT  03/04/2018  . POLYPECTOMY N/A 05/25/2013   Procedure: POLYPECTOMY (Rectal and Gastric);  Surgeon: Danie Binder, MD;  Location: [facility];  Service: Endoscopy;  Laterality: N/A;  . TONSILLECTOMY    . UPPER GASTROINTESTINAL ENDOSCOPY    . VIDEO BRONCHOSCOPY WITH ENDOBRONCHIAL ULTRASOUND  09/12/2016   Procedure: VIDEO BRONCHOSCOPY WITH ENDOBRONCHIAL ULTRASOUND AND BIOPSY;  Surgeon: Juanito Doom, MD;  Location: [facility] OR;  Service: Cardiopulmonary;;     OB History   No obstetric history on file.      Home Medications    Prior to Admission medications   Medication Sig Start Date End Date Taking? Authorizing Provider  albuterol (PROVENTIL HFA;VENTOLIN HFA) 108 (90 Base) MCG/ACT inhaler Inhale 2 puffs into the lungs every 4 (four) hours as needed for wheezing or shortness of breath.    [provider]  ALPRAZolam Duanne Moron) 0.25 MG tablet Take 2 tablets (0.5 mg total) by mouth at bedtime as needed for anxiety. 02/11/18   Curt Bears, MD  amLODipine (NORVASC) 5 MG tablet Take 5 mg by mouth daily.      [provider]  dicyclomine (BENTYL) 20 MG tablet Take 20 mg by mouth 3 (three) times daily as needed for spasms.    [provider]  diphenoxylate-atropine (LOMOTIL) 2.5-0.025 MG tablet Take 1 tablet by mouth 4 (four) times daily as needed for diarrhea or loose stools (take if immodium does not work). 01/05/19   Curt Bears, MD  estazolam (PROSOM) 2 MG tablet Take 2 mg by mouth at bedtime.    [provider]  estradiol (ESTRACE) 2 MG tablet Take 2 mg by mouth daily.      [provider]  FeFum-FePoly-FA-B Cmp-C-Biot (INTEGRA PLUS) CAPS TAKE 1 CAPSULE BY MOUTH ONCE DAILY IN THE MORNING. Patient taking differently: Take 1 capsule by mouth daily.  04/29/18   Curt Bears, MD  FLUoxetine (PROZAC) 40 MG capsule  Take 40 mg by mouth daily.     [provider]  HYDROcodone-acetaminophen (NORCO/VICODIN) 5-325 MG tablet Take 1 tablet by mouth every 4 (four) hours as needed for moderate pain.     [provider]  levothyroxine (SYNTHROID, LEVOTHROID) 50 MCG tablet Take 50 mcg by mouth daily before breakfast.     [provider]  lidocaine-prilocaine (EMLA) cream Apply 1 application topically as needed. To numb skin over port a cath: Squeeze a  small amount on cotton ball and place over port site 1-2 hours prior to chemotherapy. 02/18/18   Curt Bears, MD  loperamide (IMODIUM) 2 MG capsule Take 2 mg by mouth every 2 (two) hours as needed for diarrhea or loose stools.    [provider]  magnesium oxide (MAG-OX) 400 (241.3 Mg) MG tablet Take 1 tablet (400 mg total) by mouth 2 (two) times daily. 11/12/18   Maryanna Shape, NP  methylPREDNISolone (MEDROL DOSEPAK) 4 MG TBPK tablet Use as instructed. 11/03/18  Curt Bears, MD  metoprolol succinate (TOPROL-XL) 25 MG 24 hr tablet Take 25 mg by mouth daily.    [provider]  nitrofurantoin, macrocrystal-monohydrate, (MACROBID) 100 MG capsule Take 1 capsule (100 mg total) by mouth 2 (two) times daily. 12/03/18   Curt Bears, MD  ondansetron (ZOFRAN-ODT) 4 MG disintegrating tablet Dissolve one tablet by mouth every 8 hours as needed for nausea and vomiting, 11/30/18   Bill Salinas, NP  pantoprazole (PROTONIX) 40 MG tablet Take 40 mg by mouth 2 (two) times daily before a meal.     [provider]  promethazine (PHENERGAN) 25 MG tablet TAKE ONE TABLET BY MOUTH EVERY 6 HOURS AS NEEDED. 12/18/18   Curt Bears, MD  senna-docusate (SENOKOT-S) 8.6-50 MG tablet Take 1 tablet by mouth daily as needed. 07/17/12   [provider]  tizanidine (ZANAFLEX) 2 MG capsule Take 1 capsule (2 mg total) by mouth 3 (three) times daily as needed for muscle spasms. 12/10/18   Harle Stanford., PA-C    Family  History Family History  Problem Relation Age of Onset  . Breast cancer Mother   . Diabetes Maternal Grandfather   . Lung cancer Father   . Heart failure Sister 65       Died. Morbidly obese  . Colon cancer Neg Hx   . Colon polyps Neg Hx     Social History Social History   Tobacco Use  . Smoking status: Former Smoker    Packs/day: 2.00    Years: 20.00    Pack years: 40.00    Last attempt to quit: 05/20/2004    Years since quitting: 14.6  . Smokeless tobacco: Never Used  Substance Use Topics  . Alcohol use: No  . Drug use: No     Allergies   Penicillins; Codeine; Fentanyl; Ranitidine hcl; Keflex [cephalexin]; and Lyrica [pregabalin]   Review of Systems Review of Systems ROS: Statement: All systems negative except as marked or noted in the HPI; Constitutional: Negative for fever and chills. ; ; Eyes: Negative for eye pain, redness and discharge. ; ; ENMT: Negative for ear pain, hoarseness, nasal congestion, sinus pressure and sore throat. ; ; Cardiovascular: Negative for chest pain, palpitations, diaphoresis, dyspnea and peripheral edema. ; ; Respiratory: Negative for cough, wheezing and stridor. ; ; Gastrointestinal: +N/V/D, abd pain. Negative for blood in stool, hematemesis, jaundice and rectal bleeding. . ; ; Genitourinary: Negative for dysuria, flank pain and hematuria. ; ; Musculoskeletal: Negative for back pain and neck pain. Negative for swelling and trauma.; ; Skin: Negative for pruritus, rash, abrasions, blisters, bruising and skin lesion.; ; Neuro: Negative for headache, lightheadedness and neck stiffness. Negative for weakness, altered level of consciousness, altered mental status, extremity weakness, paresthesias, involuntary movement, seizure and syncope.       Physical Exam Updated Vital Signs BP (!) 74/50 (BP Location: Right Arm)   Pulse 68   Temp 97.9 F (36.6 C) (Oral)   Resp 20   Ht 5\' 4"  (1.626 m)   Wt 68 kg   SpO2 91%   BMI 25.75 kg/m    Patient  Vitals for the past 24 hrs:  BP Temp Temp src Pulse Resp SpO2 Height Weight  01/06/19 1500 112/68 - - 64 (!) 21 100 % - -  01/06/19 1430 (!) 110/59 - - 62 (!) 22 100 % - -  01/06/19 1400 100/78 - - 65 15 100 % - -  01/06/19 1330 105/70 - - 61 20 99 % - -  01/06/19 1300 (!) 94/50 - - 64 16 100 % - -  01/06/19 1202 (!) 97/56 - - (!) 55 20 98 % - -  01/06/19 1200 (!) 97/56 - - 65 20 99 % - -  01/06/19 1102 (!) 74/50 97.9 F (36.6 C) Oral 68 20 91 % - -  01/06/19 1057 - - - - - - 5\' 4"  (1.626 m) 68 kg      Physical Exam 1105: Physical examination:  Nursing notes reviewed; Vital signs and O2 SAT reviewed;  Constitutional: Well developed, Well nourished, In no acute distress; Head:  Normocephalic, atraumatic; Eyes: EOMI, PERRL, No scleral icterus; ENMT: Mouth and pharynx normal, Mucous membranes dry; Neck: Supple, Full range of motion, No lymphadenopathy; Cardiovascular: Regular rate and rhythm, No gallop; Respiratory: Breath sounds clear & equal bilaterally, No wheezes.  Speaking full sentences with ease, Normal respiratory effort/excursion; Chest: Nontender, Movement normal; Abdomen: Soft, +LLQ tenderness to palp. No rebound or guarding. Nondistended, Normal bowel sounds; Genitourinary: No CVA tenderness; Extremities: Peripheral pulses normal, No tenderness, No edema, No calf edema or asymmetry.; Neuro: AA&Ox3, Major CN grossly intact.  Speech clear. No gross focal motor or sensory deficits in extremities.; Skin: Color pale, Warm, Dry.    ED Treatments / Results  Labs (all labs ordered are listed, but only abnormal results are displayed)   EKG None  Radiology   Procedures Procedures (including critical care time)  Medications Ordered in ED Medications  sodium chloride 0.9 % bolus 1,000 mL (has no administration in time range)     Initial Impression / Assessment and Plan / ED Course  I have reviewed the triage vital signs and the nursing notes.  Pertinent labs & imaging  results that were available during my care of the patient were reviewed by me and considered in my medical decision making (see chart for details).  MDM Reviewed: previous chart, nursing note and vitals Reviewed previous: labs and ECG Interpretation: labs, ECG, x-ray and CT scan Total time providing critical care: 30-74 minutes. This excludes time spent performing separately reportable procedures and services. Consults: admitting MD    CRITICAL CARE Performed by: Francine Graven Total critical care time: 35 minutes Critical care time was exclusive of separately billable procedures and treating other patients. Critical care was necessary to treat or prevent imminent or life-threatening deterioration. Critical care was time spent personally by me on the following activities: development of treatment plan with patient and/or surrogate as well as nursing, discussions with consultants, evaluation of patient's response to treatment, examination of patient, obtaining history from patient or surrogate, ordering and performing treatments and interventions, ordering and review of laboratory studies, ordering and review of radiographic studies, pulse oximetry and re-evaluation of patient's condition.   ED ECG REPORT   Date: 01/06/2019  Rate: 62  Rhythm: normal sinus rhythm  QRS Axis: normal  Intervals: normal  ST/T Wave abnormalities: normal  Conduction Disutrbances:none  Narrative Interpretation:   Old EKG Reviewed: unchanged; no significant changes from previous EKG dated 02/06/2018. I have personally reviewed the EKG tracing and agree with the computerized printout as noted.  Results for orders placed or performed during the hospital encounter of 01/06/19  CBC with Differential  Result Value Ref Range   WBC 3.6 (L) 4.0 - 10.5 K/uL   RBC 2.52 (L) 3.87 - 5.11 MIL/uL   Hemoglobin 7.9 (L) 12.0 - 15.0 g/dL   HCT 24.5 (L) 36.0 - 46.0 %   MCV 97.2 80.0 - 100.0 fL   MCH  31.3 26.0 - 34.0 pg    MCHC 32.2 30.0 - 36.0 g/dL   RDW 18.6 (H) 11.5 - 15.5 %   Platelets 82 (L) 150 - 400 K/uL   nRBC 0.0 0.0 - 0.2 %   Neutrophils Relative % 56 %   Neutro Abs 2.0 1.7 - 7.7 K/uL   Lymphocytes Relative 29 %   Lymphs Abs 1.0 0.7 - 4.0 K/uL   Monocytes Relative 12 %   Monocytes Absolute 0.4 0.1 - 1.0 K/uL   Eosinophils Relative 2 %   Eosinophils Absolute 0.1 0.0 - 0.5 K/uL   Basophils Relative 0 %   Basophils Absolute 0.0 0.0 - 0.1 K/uL   Immature Granulocytes 1 %   Abs Immature Granulocytes 0.02 0.00 - 0.07 K/uL  Comprehensive metabolic panel  Result Value Ref Range   Sodium 138 135 - 145 mmol/L   Potassium 3.2 (L) 3.5 - 5.1 mmol/L   Chloride 108 98 - 111 mmol/L   CO2 23 22 - 32 mmol/L   Glucose, Bld 95 70 - 99 mg/dL   BUN 9 8 - 23 mg/dL   Creatinine, Ser 0.67 0.44 - 1.00 mg/dL   Calcium 7.1 (L) 8.9 - 10.3 mg/dL   Total Protein 5.3 (L) 6.5 - 8.1 g/dL   Albumin 3.2 (L) 3.5 - 5.0 g/dL   AST 12 (L) 15 - 41 U/L   ALT 8 0 - 44 U/L   Alkaline Phosphatase 35 (L) 38 - 126 U/L   Total Bilirubin 0.7 0.3 - 1.2 mg/dL   GFR calc non Af Amer >60 >60 mL/min   GFR calc Af Amer >60 >60 mL/min   Anion gap 7 5 - 15  Urinalysis, Routine w reflex microscopic  Result Value Ref Range   Color, Urine AMBER (A) YELLOW   APPearance CLEAR CLEAR   Specific Gravity, Urine 1.003 (L) 1.005 - 1.030   pH 7.0 5.0 - 8.0   Glucose, UA NEGATIVE NEGATIVE mg/dL   Hgb urine dipstick SMALL (A) NEGATIVE   Bilirubin Urine NEGATIVE NEGATIVE   Ketones, ur NEGATIVE NEGATIVE mg/dL   Protein, ur NEGATIVE NEGATIVE mg/dL   Nitrite POSITIVE (A) NEGATIVE   Leukocytes, UA TRACE (A) NEGATIVE   RBC / HPF 0-5 0 - 5 RBC/hpf   WBC, UA 11-20 0 - 5 WBC/hpf   Bacteria, UA MANY (A) NONE SEEN   Squamous Epithelial / LPF 0-5 0 - 5  Lactic acid, plasma  Result Value Ref Range   Lactic Acid, Venous 1.3 0.5 - 1.9 mmol/L  Lactic acid, plasma  Result Value Ref Range   Lactic Acid, Venous 0.8 0.5 - 1.9 mmol/L  Lipase, blood  Result  Value Ref Range   Lipase 21 11 - 51 U/L  Troponin I - Once  Result Value Ref Range   Troponin I <0.03 <0.03 ng/mL  Magnesium  Result Value Ref Range   Magnesium 1.1 (L) 1.7 - 2.4 mg/dL  Type and screen  Result Value Ref Range   ABO/RH(D) O POS    Antibody Screen NEG    Sample Expiration      01/09/2019 Performed at [facility], [address], [place], [place] [address]    Dg Chest 2 View Result Date: 01/06/2019 CLINICAL DATA:  Abdominal pain with nausea, vomiting, and diarrhea. Currently undergoing treatment for lung cancer. EXAM: CHEST - 2 VIEW COMPARISON:  Chest x-ray dated 07/07/2017 and CT scan of the chest dated 10/27/2018 FINDINGS: Power port in place with the tip in  the superior vena cava just below the carina in good position. The heart size and pulmonary vascularity are normal. The patchy infiltrates in the right lung seen on the prior chest CT have resolved. There is scarring in the medial aspect of the left upper lobe. On the lateral view there is fullness in the aortopulmonary window consistent with the adenopathy noted on the recent CT scan. No effusions. There is also slight increased density medially at the left lung base felt to be secondary to prior radiation therapy, also as demonstrated on the prior CT scan. IMPRESSION: No acute abnormality in the chest. Electronically Signed   By: Lorriane Shire M.D.   On: 01/06/2019 13:05    Results for OAKLYNN, STIERWALT (MRN [id number]) as of 01/06/2019 15:33  Ref. Range 12/21/2018 13:16 12/29/2018 08:49 01/06/2019 11:16  Hemoglobin Latest Ref Range: 12.0 - 15.0 g/dL 9.6 (L) 9.9 (L) 7.9 (L)  HCT Latest Ref Range: 36.0 - 46.0 % 28.9 (L) 29.5 (L) 24.5 (L)  Platelets Latest Ref Range: 150 - 400 K/uL 98 (L) 94 (L) 82 (L)    1550:  Potassium and magnesium repleted IV. IVF NS boluses given for hypotension with improvement. H/H lower than previous, but pt s/p chemo tx. No fever, vomiting or diarrhea while in the ED. +UTI, UC pending; IV cipro ordered.  Dx and testing d/w pt.  Questions answered.  Verb understanding, agreeable to admit. T/C returned from [facility]. Posey Pronto, case discussed, including:  HPI, pertinent PM/SHx, VS/PE, dx testing, ED course and treatment:  Agreeable to admit.      Final Clinical Impressions(s) / ED Diagnoses   Final diagnoses:  None    ED Discharge Orders    None       Francine Graven, DO 01/09/19 1529

## 2019-01-06 NOTE — Progress Notes (Signed)
Pharmacy Antibiotic Note  Rebekah Henderson is a 63 y.o. female admitted on 01/06/2019 with UTI-history of ESBL E. coli.  Pharmacy has been consulted for Meropenem dosing.  Plan: Meropenem 1000 mg IV every 8 hours.  Monitor labs, c/s, and patient improvement.  Height: 5\' 4"  (162.6 cm) Weight: 150 lb (68 kg) IBW/kg (Calculated) : 54.7  Temp (24hrs), Avg:97.9 F (36.6 C), Min:97.9 F (36.6 C), Max:97.9 F (36.6 C)  Recent Labs  Lab 01/06/19 1116 01/06/19 1311  WBC 3.6*  --   CREATININE 0.67  --   LATICACIDVEN 1.3 0.8    Estimated Creatinine Clearance: 67.3 mL/min (by C-G formula based on SCr of 0.67 mg/dL).    Allergies  Allergen Reactions  . Penicillins Hives, Swelling and Other (See Comments)    Reaction:  Face/mouth swelling  Has patient had a PCN reaction causing immediate rash, facial/tongue/throat swelling, SOB or lightheadedness with hypotension: Yes Has patient had a PCN reaction causing severe rash involving mucus membranes or skin necrosis: No Has patient had a PCN reaction that required hospitalization No Has patient had a PCN reaction occurring within the last 10 years: No If all of the above answers are "NO", then may proceed with Cephalosporin use.  . Codeine Nausea Only  . Fentanyl Nausea And Vomiting    restless  . Ranitidine Hcl Other (See Comments) and Nausea Only    Reaction:  Dizziness   . Keflex [Cephalexin] Other (See Comments)    Reaction:  Unknown   . Lyrica [Pregabalin] Other (See Comments)    Reaction:  Somnolence     Antimicrobials this admission: Merrem 2/5 >>      Dose adjustments this admission: N/A  Microbiology results: 2/5 UCx: pending    Thank you for allowing pharmacy to be a part of this patient's care.  Ramond Craver 01/06/2019 5:24 PM

## 2019-01-06 NOTE — H&P (Signed)
History and Physical    Rebekah Henderson VEH:[id number] DOB: 03/14/1956 DOA: 01/06/2019  PCP: Redmond School, MD  Patient coming from: Home  I have personally briefly reviewed patient's old medical records in [place]  Chief Complaint: Nausea, vomiting, diarrhea  HPI: Rebekah Henderson is a 63 y.o. 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  Had her last chemotherapy treatment on 12/29/2018.  She says she did well for few days afterwards but then had a sudden onset of nausea, vomiting, diarrhea with associated suprapubic discomfort and dysuria.  She reports occasional nighttime fevers without diaphoresis.  She has had increased shortness of breath from baseline requiring her to use supplemental O2 at home.  She reports poor appetite the last 2 days.  She denies any obvious bleeding including hemoptysis, hematemesis, hematuria, hematochezia, or melena.  Due to continued symptoms she called EMS.  On their arrival she was noted to have SBP in the 80s.  She had a UTI about 4 weeks ago which was ESBL E. coli and she was treated with nitrofurantoin which she completed.  ED Course:  Initial vitals showed BP 97/56, pulse 57, RR 20, temp 97.9 Fahrenheit, SPO2 99% on 2 L O2 via [place].  Labs are notable for K 3.2, magnesium 1.1, 1.9 (baseline 9-11), platelets 82,000.  Urinalysis was positive for nitrites, trace leukocytes, and many bacteria.  2 view chest x-ray showed port in place, medial left lung base changes felt secondary to prior radiation therapy, without acute abnormality.  CT abdomen/pelvis with contrast showed mild mucosal enhancement in the right colon, cecum, proximal transverse colon which may represent mild colitis.  Interval mild right renal collecting system and ureter mucosal thickening enhancement seen.  Patient was  given 2 L normal saline, IV magnesium, IV potassium, and Cipro/Flagyl.  The hospital service was consulted to admit for further evaluation and management.   Review of Systems: As per HPI otherwise 10 point review of systems negative.    Past Medical History:  Diagnosis Date  . 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 Anxiety    takes Prozac daily  . Arthritis   . Benign fundic gland polyps of stomach   . Colon polyps   . COPD (chronic obstructive pulmonary disease) ([place])   . Dehydration 03/06/2017  . Diverticulitis   . Dyspnea    with exertion  . Encounter for antineoplastic chemotherapy 12/03/2016  . Fibromyalgia   . GERD (gastroesophageal reflux disease)    takes Pantoprazole daily  . Hypertension    takes Metoprolol,Triamterene-HCTZ,and Amlodipine daily  . Hypothyroidism    takes Synthroid daily  . IBS (irritable bowel syndrome)   . lung ca dx'd 10/02/2016   skin, lung  . PONV (postoperative nausea and vomiting)    pt also states that she had some difficulty breathing after cervical fusion    Past Surgical History:  Procedure Laterality Date  . BIOPSY N/A 05/25/2013   Procedure: BIOPSIES (Random Colon; Duodenal; Gastric);  Surgeon: Danie Binder, MD;  Location: [facility];  Service: Endoscopy;  Laterality: N/A;  . BLADDER SUSPENSION    . BREAST ENHANCEMENT SURGERY    . BREAST IMPLANT REMOVAL    . CERVICAL FUSION  AUG 2013  . CHOLECYSTECTOMY  1999  . COLONOSCOPY  2007 [place]   POLYPS  . COLONOSCOPY WITH PROPOFOL N/A 05/25/2013  Procedure: COLONOSCOPY WITH PROPOFOL(at cecum 0957) total withdrawal time=61min);  Surgeon: Danie Binder, MD;  Location: [facility];  Service: Endoscopy;  Laterality: N/A;  . ESOPHAGOGASTRODUODENOSCOPY (EGD) WITH PROPOFOL N/A 05/25/2013   Procedure: ESOPHAGOGASTRODUODENOSCOPY (EGD) WITH PROPOFOL;  Surgeon: Danie Binder, MD;  Location: [facility];  Service: Endoscopy;  Laterality: N/A;  . FOOT SURGERY    . IR FLUORO GUIDE PORT INSERTION  RIGHT  03/04/2018  . IR US GUIDE VASC ACCESS RIGHT  03/04/2018  . POLYPECTOMY N/A 05/25/2013   Procedure: POLYPECTOMY (Rectal and Gastric);  Surgeon: Danie Binder, MD;  Location: [facility];  Service: Endoscopy;  Laterality: N/A;  . TONSILLECTOMY    . UPPER GASTROINTESTINAL ENDOSCOPY    . VIDEO BRONCHOSCOPY WITH ENDOBRONCHIAL ULTRASOUND  09/12/2016   Procedure: VIDEO BRONCHOSCOPY WITH ENDOBRONCHIAL ULTRASOUND AND BIOPSY;  Surgeon: Juanito Doom, MD;  Location: [facility];  Service: Cardiopulmonary;;     reports that she quit smoking about 14 years ago. She has a 40.00 pack-year smoking history. She has never used smokeless tobacco. She reports that she does not drink alcohol or use drugs.  Allergies  Allergen Reactions  . Penicillins Hives, Swelling and Other (See Comments)    Reaction:  Face/mouth swelling  Has patient had a PCN reaction causing immediate rash, facial/tongue/throat swelling, SOB or lightheadedness with hypotension: Yes Has patient had a PCN reaction causing severe rash involving mucus membranes or skin necrosis: No Has patient had a PCN reaction that required hospitalization No Has patient had a PCN reaction occurring within the last 10 years: No If all of the above answers are "NO", then may proceed with Cephalosporin use.  . Codeine Nausea Only  . Fentanyl Nausea And Vomiting    restless  . Ranitidine Hcl Other (See Comments) and Nausea Only    Reaction:  Dizziness   . Keflex [Cephalexin] Other (See Comments)    Reaction:  Unknown   . Lyrica [Pregabalin] Other (See Comments)    Reaction:  Somnolence     Family History  Problem Relation Age of Onset  . Breast cancer Mother   . Diabetes Maternal Grandfather   . Lung cancer Father   . Heart failure Sister 30       Died. Morbidly obese  . Colon cancer Neg Hx   . Colon polyps Neg Hx      Prior to Admission medications   Medication Sig Start Date End Date Taking? Authorizing Provider  albuterol (PROVENTIL  HFA;VENTOLIN HFA) 108 (90 Base) MCG/ACT inhaler Inhale 2 puffs into the lungs every 4 (four) hours as needed for wheezing or shortness of breath.   Yes [provider]  ALPRAZolam (XANAX) 0.25 MG tablet Take 2 tablets (0.5 mg total) by mouth at bedtime as needed for anxiety. 02/11/18  Yes Curt Bears, MD  amLODipine (NORVASC) 5 MG tablet Take 5 mg by mouth daily.     Yes [provider]  cyclobenzaprine (FLEXERIL) 10 MG tablet Take 10 mg by mouth once as needed for muscle spasms.   Yes [provider]  dicyclomine (BENTYL) 20 MG tablet Take 20 mg by mouth 3 (three) times daily as needed for spasms.   Yes [provider]  diphenoxylate-atropine (LOMOTIL) 2.5-0.025 MG tablet Take 1 tablet by mouth 4 (four) times daily as needed for diarrhea or loose stools (take if immodium does not work). 01/05/19  Yes Curt Bears, MD  estazolam (PROSOM) 2 MG tablet Take 2 mg by mouth at bedtime.   Yes  [provider]  estradiol (ESTRACE) 2 MG tablet Take 2 mg by mouth daily.     Yes [provider]  FLUoxetine (PROZAC) 40 MG capsule Take 40 mg by mouth daily.    Yes [provider]  HYDROcodone-acetaminophen (NORCO/VICODIN) 5-325 MG tablet Take 1 tablet by mouth every 4 (four) hours as needed for moderate pain.    Yes [provider]  levothyroxine (SYNTHROID, LEVOTHROID) 50 MCG tablet Take 50 mcg by mouth daily before breakfast.    Yes [provider]  lidocaine-prilocaine (EMLA) cream Apply 1 application topically as needed. To numb skin over port a cath: Squeeze a  small amount on cotton ball and place over port site 1-2 hours prior to chemotherapy. 02/18/18  Yes Curt Bears, MD  loperamide (IMODIUM) 2 MG capsule Take 2 mg by mouth every 2 (two) hours as needed for diarrhea or loose stools.   Yes [provider]  metoprolol succinate (TOPROL-XL) 25 MG 24 hr tablet Take 25 mg by mouth daily.   Yes [provider]  ondansetron (ZOFRAN-ODT) 4 MG disintegrating tablet Dissolve one tablet by mouth every 8 hours as needed for nausea and vomiting, 11/30/18  Yes Bill Salinas, NP  pantoprazole (PROTONIX) 40 MG tablet Take 40 mg by mouth 2 (two) times daily before a meal.    Yes [provider]  potassium chloride SA (K-DUR,KLOR-CON) 20 MEQ tablet Take 20 mEq by mouth daily as needed (for supplement).   Yes [provider]  promethazine (PHENERGAN) 25 MG tablet TAKE ONE TABLET BY MOUTH EVERY 6 HOURS AS NEEDED. Patient taking differently: Take 25 mg by mouth every 6 (six) hours as needed for nausea or vomiting.  12/18/18  Yes Curt Bears, MD  senna-docusate (SENOKOT-S) 8.6-50 MG tablet Take 1 tablet by mouth daily as needed for mild constipation or moderate constipation.  07/17/12  Yes [provider]  tizanidine (ZANAFLEX) 2 MG capsule Take 1 capsule (2 mg total) by mouth 3 (three) times daily as needed for muscle spasms. 12/10/18  Yes Tanner, Lyndon Code., PA-C  magnesium oxide (MAG-OX) 400 (241.3 Mg) MG tablet Take 1 tablet (400 mg total) by mouth 2 (two) times daily. 11/12/18   Maryanna Shape, NP  methylPREDNISolone (MEDROL DOSEPAK) 4 MG TBPK tablet Use as instructed. Patient not taking: Reported on 01/06/2019 11/03/18   Curt Bears, MD  nitrofurantoin, macrocrystal-monohydrate, (MACROBID) 100 MG capsule Take 1 capsule (100 mg total) by mouth 2 (two) times daily. Patient not taking: Reported on 01/06/2019 12/03/18   Curt Bears, MD    Physical Exam: Vitals:   01/06/19 1500 01/06/19 1530 01/06/19 1600 01/06/19 1630  BP: 112/68 119/66 127/71   Pulse: 64 64  68  Resp: (!) 21 18 (!) 22 18  Temp:      TempSrc:      SpO2: 100% 100%  100%  Weight:      Height:        Constitutional: Chronically ill-appearing woman resting supine in bed, NAD, calm, comfortable Eyes: PERRL, lids and conjunctivae normal ENMT: Mucous membranes are dry. Posterior pharynx clear of any exudate or  lesions. Normal dentition.  Neck: normal, supple, no masses. Respiratory: Expiratory wheezing bilaterally. Normal respiratory effort. No accessory muscle use.  Cardiovascular: Regular rate and rhythm, no murmurs / rubs / gallops. No extremity edema.  Port-A-Cath right chest wall. Abdomen: Suprapubic tenderness, no masses palpated. No hepatosplenomegaly. Bowel sounds positive.  Musculoskeletal: no clubbing / cyanosis. No joint deformity upper and  lower extremities. Good ROM, no contractures. Normal muscle tone.  Skin: no rashes, lesions, ulcers. No induration Neurologic: CN 2-12 grossly intact. Sensation intact, Strength 5/5 in all 4.  Psychiatric: Normal judgment and insight. Alert and oriented x 3. Normal mood.     Labs on Admission: I have personally reviewed following labs and imaging studies  CBC: Recent Labs  Lab 01/06/19 1116  WBC 3.6*  NEUTROABS 2.0  HGB 7.9*  HCT 24.5*  MCV 97.2  PLT 82*   Basic Metabolic Panel: Recent Labs  Lab 01/06/19 1116  NA 138  K 3.2*  CL 108  CO2 23  GLUCOSE 95  BUN 9  CREATININE 0.67  CALCIUM 7.1*  MG 1.1*   GFR: Estimated Creatinine Clearance: 67.3 mL/min (by C-G formula based on SCr of 0.67 mg/dL). Liver Function Tests: Recent Labs  Lab 01/06/19 1116  AST 12*  ALT 8  ALKPHOS 35*  BILITOT 0.7  PROT 5.3*  ALBUMIN 3.2*   Recent Labs  Lab 01/06/19 1116  LIPASE 21   No results for input(s): AMMONIA in the last 168 hours. Coagulation Profile: No results for input(s): INR, PROTIME in the last 168 hours. Cardiac Enzymes: Recent Labs  Lab 01/06/19 1116  TROPONINI <0.03   BNP (last 3 results) No results for input(s): PROBNP in the last 8760 hours. HbA1C: No results for input(s): HGBA1C in the last 72 hours. CBG: No results for input(s): GLUCAP in the last 168 hours. Lipid Profile: No results for input(s): CHOL, HDL, LDLCALC, TRIG, CHOLHDL, LDLDIRECT in the last 72 hours. Thyroid Function Tests: No results for  input(s): TSH, T4TOTAL, FREET4, T3FREE, THYROIDAB in the last 72 hours. Anemia Panel: No results for input(s): VITAMINB12, FOLATE, FERRITIN, TIBC, IRON, RETICCTPCT in the last 72 hours. Urine analysis:    Component Value Date/Time   COLORURINE AMBER (A) 01/06/2019 1430   APPEARANCEUR CLEAR 01/06/2019 1430   LABSPEC 1.003 (L) 01/06/2019 1430   PHURINE 7.0 01/06/2019 1430   GLUCOSEU NEGATIVE 01/06/2019 1430   HGBUR SMALL (A) 01/06/2019 1430   BILIRUBINUR NEGATIVE 01/06/2019 1430   KETONESUR NEGATIVE 01/06/2019 1430   PROTEINUR NEGATIVE 01/06/2019 1430   UROBILINOGEN 0.2 03/10/2014 1027   NITRITE POSITIVE (A) 01/06/2019 1430   LEUKOCYTESUR TRACE (A) 01/06/2019 1430    Radiological Exams on Admission: Dg Chest 2 View  Result Date: 01/06/2019 CLINICAL DATA:  Abdominal pain with nausea, vomiting, and diarrhea. Currently undergoing treatment for lung cancer. EXAM: CHEST - 2 VIEW COMPARISON:  Chest x-ray dated 07/07/2017 and CT scan of the chest dated 10/27/2018 FINDINGS: Power port in place with the tip in the superior vena cava just below the carina in good position. The heart size and pulmonary vascularity are normal. The patchy infiltrates in the right lung seen on the prior chest CT have resolved. There is scarring in the medial aspect of the left upper lobe. On the lateral view there is fullness in the aortopulmonary window consistent with the adenopathy noted on the recent CT scan. No effusions. There is also slight increased density medially at the left lung base felt to be secondary to prior radiation therapy, also as demonstrated on the prior CT scan. IMPRESSION: No acute abnormality in the chest. Electronically Signed   By: Lorriane Shire M.D.   On: 01/06/2019 13:05   Ct Abdomen Pelvis W Contrast  Result Date: 01/06/2019 CLINICAL DATA:  Nausea, vomiting and diarrhea with abdominal pain for the past 3 days. History of metastatic lung cancer with her last chemotherapy  treatment on  12/29/2018. EXAM: CT ABDOMEN AND PELVIS WITH CONTRAST TECHNIQUE: Multidetector CT imaging of the abdomen and pelvis was performed using the standard protocol following bolus administration of intravenous contrast. CONTRAST:  19mL OMNIPAQUE IOHEXOL 300 MG/ML  SOLN COMPARISON:  11/01/2018.  PET-CT dated 02/09/2018. FINDINGS: Lower chest: Mildly progressive atelectasis in the medial aspect of the left lower lobe, partially surrounding the descending thoracic aorta. Interval decrease in the small amount of adjacent pleural fluid/thickening. Mild dependent atelectasis in the right lower lobe. Hepatobiliary: Stable mild diffuse low density of the liver and cholecystectomy clips. Mild post cholecystectomy intrahepatic ductal dilatation with mild improvement. The previously demonstrated oval area of low density in the posterior aspect of the lateral segment of the left lobe of the liver appears smaller and less prominent. This previously measured 2.5 cm in maximum diameter and currently measures 1.0 cm in maximum diameter on image number 27 series 2. Pancreas: Unremarkable. No pancreatic ductal dilatation or surrounding inflammatory changes. Spleen: Normal in size without focal abnormality. Adrenals/Urinary Tract: Normal appearing adrenal glands. Stable small upper pole right renal cyst. Interval mild right renal collecting system and ureter mucosal thickening and enhancement. Interval minimal similar changes on the left. Unremarkable urinary bladder. No urinary tract calculi or hydronephrosis seen. Stomach/Bowel: Moderately large hiatal hernia without significant change. Multiple colonic diverticula without evidence of diverticulitis. No evidence of appendicitis. Mild mucosal enhancement in the right colon, cecum and proximal transverse colon without significant thickening. Previously noted duodenal periampullary diverticulum. Vascular/Lymphatic: Atheromatous arterial calcifications without aneurysm. No enlarged lymph  nodes. Reproductive: Status post hysterectomy. No adnexal masses. Other: No abdominal wall hernia or abnormality. No abdominopelvic ascites. Musculoskeletal: The previously demonstrated 2 cm oval sclerotic lesion in the L3 vertebral body is unchanged. Lumbar and lower thoracic spine degenerative changes. IMPRESSION: 1. Mild mucosal enhancement in the right colon, cecum and proximal transverse colon without significant thickening. This could be due to mild colitis. 2. Interval mild right renal collecting system and ureter mucosal thickening and enhancement. This can be seen with urinary tract infection. Interval minimal similar changes on the left. 3. Interval decrease in size of the previously demonstrated left lobe liver metastasis. 4. Stable moderate-sized hiatal hernia. 5. Stable L3 vertebral body sclerotic lesion compatible with a treated previously demonstrated metastasis. Electronically Signed   By: Claudie Revering M.D.   On: 01/06/2019 15:36    EKG: Independently reviewed. Sinus rhythm, low voltage.  Assessment/Plan Principal Problem:   Gastroenteritis Active Problems:   COPD (chronic obstructive pulmonary disease) ([facility])   Hypertension   Hypothyroidism   Small cell lung cancer, left ([facility])   Hypokalemia   Hypomagnesemia   UTI (urinary tract infection)  Rebekah Henderson is a 63 y.o. 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   Gastroenteritis: Suspect delayed effect from chemotherapy.  Will continue supportive care. -Continue maintenance IV fluids overnight -Clear liquid diet, advance as tolerated -Antiemetics PRN  Hypokalemia: K 3.2 on admission.  Given IV repletion in the ED. -K 40 mEq oral once now -Repeat labs in a.m.  Hypomagnesemia: Mg 1.1 on admission, received repletion. -Recheck in a.m.  COPD: On as  needed O2 at home.  Has wheezing on admission. -Scheduled duo nebs and as needed albuterol nebs  UTI: Recent ESBL E. coli UTI, was treated with nitrofurantoin.  Has had continued dysuria.  Appears to  have received a dose of meropenem January 2018 without reported adverse effect. -Start IV meropenem per pharmacy pending urine culture results  Hypertension: Hypotensive on admission, improving with IV fluids. -Hold home metoprolol, amlodipine for now  Anemia and thrombocytopenia: Suspect secondary to chemotherapy use.  Hemoglobin and platelets decreased from recent baseline.  She denies any obvious bleeding. -Continue to monitor  Small cell lung cancer: Undergoing systemic chemotherapy and radiation.  Follows with Dr. Curt Bears with oncology.  DVT prophylaxis: SCDs Code Status: Full code Family Communication: None present at bedside admission Disposition Plan: Pending resolution of persistent nausea, vomiting, diarrhea and ability to maintain adequate oral intake. Consults called: None Admission status: Observation   Zada Finders MD [facility] Pager [phone]  If 7PM-7AM, please contact night-coverage [email or web address]  01/06/2019, 4:47 PM

## 2019-01-07 ENCOUNTER — Telehealth: Payer: Self-pay | Admitting: Medical Oncology

## 2019-01-07 ENCOUNTER — Encounter (HOSPITAL_COMMUNITY): Payer: Self-pay

## 2019-01-07 DIAGNOSIS — N390 Urinary tract infection, site not specified: Secondary | ICD-10-CM

## 2019-01-07 DIAGNOSIS — E876 Hypokalemia: Secondary | ICD-10-CM

## 2019-01-07 DIAGNOSIS — D696 Thrombocytopenia, unspecified: Secondary | ICD-10-CM

## 2019-01-07 DIAGNOSIS — K529 Noninfective gastroenteritis and colitis, unspecified: Secondary | ICD-10-CM | POA: Diagnosis not present

## 2019-01-07 DIAGNOSIS — B9620 Unspecified Escherichia coli [E. coli] as the cause of diseases classified elsewhere: Secondary | ICD-10-CM

## 2019-01-07 LAB — BASIC METABOLIC PANEL
Anion gap: 4 — ABNORMAL LOW (ref 5–15)
BUN: 5 mg/dL — ABNORMAL LOW (ref 8–23)
CO2: 23 mmol/L (ref 22–32)
Calcium: 6.8 mg/dL — ABNORMAL LOW (ref 8.9–10.3)
Chloride: 114 mmol/L — ABNORMAL HIGH (ref 98–111)
Creatinine, Ser: 0.51 mg/dL (ref 0.44–1.00)
GFR calc Af Amer: >60 mL/min (ref >60)
GFR calc non Af Amer: >60 mL/min (ref >60)
Glucose, Bld: 86 mg/dL (ref 70–99)
Potassium: 3.3 mmol/L — ABNORMAL LOW (ref 3.5–5.1)
Sodium: 141 mmol/L (ref 135–145)

## 2019-01-07 LAB — CBC
HCT: 21.4 % — ABNORMAL LOW (ref 36.0–46.0)
Hemoglobin: 7 g/dL — ABNORMAL LOW (ref 12.0–15.0)
MCH: 32.1 pg (ref 26.0–34.0)
MCHC: 32.7 g/dL (ref 30.0–36.0)
MCV: 98.2 fL (ref 80.0–100.0)
PLATELETS: 67 10*3/uL — AB (ref 150–400)
RBC: 2.18 MIL/uL — ABNORMAL LOW (ref 3.87–5.11)
RDW: 19.1 % — ABNORMAL HIGH (ref 11.5–15.5)
WBC: 2.8 10*3/uL — ABNORMAL LOW (ref 4.0–10.5)
nRBC: 0 % (ref 0.0–0.2)

## 2019-01-07 LAB — MAGNESIUM: Magnesium: 1.2 mg/dL — ABNORMAL LOW (ref 1.7–2.4)

## 2019-01-07 LAB — HIV ANTIBODY (ROUTINE TESTING W REFLEX): HIV Screen 4th Generation wRfx: NONREACTIVE

## 2019-01-07 MED ORDER — POTASSIUM CHLORIDE IN NACL 20-0.9 MEQ/L-% IV SOLN
INTRAVENOUS | Status: AC
  Administered 2019-01-07 (×2): via INTRAVENOUS

## 2019-01-07 MED ORDER — MAGNESIUM SULFATE 4 GM/100ML IV SOLN
4.0000 g | Freq: Once | INTRAVENOUS | Status: AC
  Administered 2019-01-07: 4 g via INTRAVENOUS
  Filled 2019-01-07: qty 100

## 2019-01-07 MED ORDER — POTASSIUM CHLORIDE CRYS ER 20 MEQ PO TBCR
40.0000 meq | EXTENDED_RELEASE_TABLET | Freq: Once | ORAL | Status: AC
  Administered 2019-01-07: 40 meq via ORAL
  Filled 2019-01-07: qty 2

## 2019-01-07 NOTE — Care Management Obs Status (Signed)
[facility] NOTIFICATION   Patient Details  Name: Rebekah Henderson MRN: [id number] Date of Birth: 08/28/1956   Medicare Observation Status Notification Given:  Yes    Sherald Barge, RN 01/07/2019, 8:36 AM

## 2019-01-07 NOTE — Progress Notes (Signed)
PROGRESS NOTE                                                                                                                                                                                                             Patient Demographics:    Rebekah Henderson, is a 63 y.o. female, DOB - [birth year], WYO:[id number]  Admit date - 01/06/2019   Admitting Physician Lenore Cordia, MD  Outpatient Primary MD for the patient is Redmond School, MD  LOS - 0  Outpatient Specialists: Dr Julien Nordmann  Chief Complaint  Patient presents with  . Abdominal Pain       Brief Narrative   63 year old female with a history of COPD, hypertension, small cell lung cancer on systemic chemotherapy and radiation, hypothyroidism presented to the ED with 3-day history of nausea, vomiting and persistent watery diarrhea.  Her last chemotherapy was on 1/28.  Symptoms started a few days after the chemotherapy.  Also had a suprapubic discomfort with dysuria and subjective fevers.  Also reported increasing shortness of breath requiring supplemental home O2 and poor appetite. Patient was treated with ESBL E. coli UTI 4 weeks back (with nitrofurantoin). In the ED she was hypotensive with blood pressure of 97/56 mmHg, potassium of 3.2, magnesium 1.1 and platelets of 80 2K.  UA positive for UTI.  Chest x-ray negative for infiltrate.  CT abdomen pelvis with contrast showed mild mucosal enhancement in the right colon, cecum, proximal transverse colon (possible for mild colitis). Patient given 2 L normal saline bolus, IV potassium, IV magnesium and placed on empiric Cipro and Flagyl. Hospitalist consulted for further management.     Subjective:   No further nausea or vomiting.  No diarrhea since last night.  Wants to advance diet.  Anxious about getting her CT tomorrow.  Also complains of some headache.   Assessment  & Plan :   Hospital course Acute  gastroenteritis Suspect symptoms post chemotherapy.  Cannot rule out viral gastroenteritis. Currently improved (no diarrhea since last night).  Advance diet to regular. Continue IV fluids and PRN antiemetics.  Hypokalemia/hypomagnesemia Being replenished  COPD Continue O2 as needed.  Wheezing on admission currently resolved.  Continue duo nebs.  UTI Recent ESBL E. coli UTI which was treated with nitrofurantoin.  Reported some dysuria on presentation.  Urine culture growing E. coli.  Sensitivity pending.  Continue empiric meropenem.  Anemia and thrombocytopenia Secondary to chemotherapy.  Currently stable.  Small cell lung cancer On systemic chemotherapy and radiation and follows with Dr. Julien Nordmann.  Was scheduled for CT of the chest, abdomen pelvis with contrast tomorrow.  She had a CT of the abdomen pelvis in the ED and will send her for CT of the chest as scheduled tomorrow if she is ready for discharge.  Essential hypertension Home metoprolol and amlodipine held due to hypotension on presentation.  Currently improved.        Code Status : Full code  Family Communication  : None at bedside  Disposition Plan  : Possibly in a.m. if symptoms resolved.   Barriers For Discharge : Improving symptoms  Consults  : None  Procedures  : CT abdomen pelvis with contrast  DVT Prophylaxis  : SCDs  Lab Results  Component Value Date   PLT 67 (L) 01/07/2019    Antibiotics  :    Anti-infectives (From admission, onward)   Start     Dose/Rate Route Frequency Ordered Stop   01/06/19 1700  meropenem (MERREM) 1 g in sodium chloride 0.9 % 100 mL IVPB     1 g 200 mL/hr over 30 Minutes Intravenous Every 8 hours 01/06/19 1653     01/06/19 1545  metroNIDAZOLE (FLAGYL) IVPB 500 mg  Status:  Discontinued     500 mg 100 mL/hr over 60 Minutes Intravenous  Once 01/06/19 1540 01/06/19 1738   01/06/19 1530  ciprofloxacin (CIPRO) IVPB 400 mg  Status:  Discontinued     400 mg 200 mL/hr over 60  Minutes Intravenous  Once 01/06/19 1528 01/06/19 1641        Objective:   Vitals:   01/07/19 0702 01/07/19 0817 01/07/19 1425 01/07/19 1511  BP: 101/81  134/87   Pulse: 77  72   Resp: 20  18   Temp: 97.7 F (36.5 C)  97.8 F (36.6 C)   TempSrc: Oral  Oral   SpO2: 100% 97% 100% 92%  Weight:      Height:        Wt Readings from Last 3 Encounters:  01/06/19 68 kg  12/29/18 73.3 kg  12/21/18 73.9 kg     Intake/Output Summary (Last 24 hours) at 01/07/2019 1553 Last data filed at 01/07/2019 1214 Gross per 24 hour  Intake 1871.66 ml  Output 1550 ml  Net 321.66 ml     Physical Exam  Gen: not in distress, fatigued and anxious HEENT: Pallor present, moist mucosa, supple neck, no oral thrush Chest: clear b/l, no added sounds, right-sided Port-A-Cath CVS: N S1&S2, no murmurs GI: soft, NT, ND, BS+ Musculoskeletal: warm, no edema     Data Review:    CBC Recent Labs  Lab 01/06/19 1116 01/07/19 0531  WBC 3.6* 2.8*  HGB 7.9* 7.0*  HCT 24.5* 21.4*  PLT 82* 67*  MCV 97.2 98.2  MCH 31.3 32.1  MCHC 32.2 32.7  RDW 18.6* 19.1*  LYMPHSABS 1.0  --   MONOABS 0.4  --   EOSABS 0.1  --   BASOSABS 0.0  --     Chemistries  Recent Labs  Lab 01/06/19 1116 01/07/19 0531  NA 138 141  K 3.2* 3.3*  CL 108 114*  CO2 23 23  GLUCOSE 95 86  BUN 9 5*  CREATININE 0.67 0.51  CALCIUM 7.1* 6.8*  MG 1.1* 1.2*  AST 12*  --   ALT 8  --   ALKPHOS 35*  --  BILITOT 0.7  --    ------------------------------------------------------------------------------------------------------------------ No results for input(s): CHOL, HDL, LDLCALC, TRIG, CHOLHDL, LDLDIRECT in the last 72 hours.  No results found for: HGBA1C ------------------------------------------------------------------------------------------------------------------ No results for input(s): TSH, T4TOTAL, T3FREE, THYROIDAB in the last 72 hours.  Invalid input(s):  FREET3 ------------------------------------------------------------------------------------------------------------------ No results for input(s): VITAMINB12, FOLATE, FERRITIN, TIBC, IRON, RETICCTPCT in the last 72 hours.  Coagulation profile No results for input(s): INR, PROTIME in the last 168 hours.  No results for input(s): DDIMER in the last 72 hours.  Cardiac Enzymes Recent Labs  Lab 01/06/19 1116  TROPONINI <0.03   ------------------------------------------------------------------------------------------------------------------    Component Value Date/Time   BNP 91.4 03/21/2017 0719   BNP 13.1 11/11/2015 1045    Inpatient Medications  Scheduled Meds: . FLUoxetine  40 mg Oral Daily  . ipratropium-albuterol  3 mL Nebulization Q6H  . levothyroxine  50 mcg Oral QAC breakfast  . pantoprazole  40 mg Oral BID AC  . potassium chloride  40 mEq Oral Once  . sodium chloride flush  3 mL Intravenous Q12H   Continuous Infusions: . 0.9 % NaCl with KCl 20 mEq / L 100 mL/hr at 01/07/19 1029  . meropenem (MERREM) IV 1 g (01/07/19 1029)   PRN Meds:.acetaminophen **OR** acetaminophen, albuterol, ALPRAZolam, HYDROcodone-acetaminophen, ondansetron **OR** ondansetron (ZOFRAN) IV  Micro Results Recent Results (from the past 240 hour(s))  Urine culture     Status: Abnormal (Preliminary result)   Collection Time: 01/06/19  2:30 PM  Result Value Ref Range Status   Specimen Description   Final    URINE, CLEAN CATCH Performed at [facility], [address] [place], [place] [address]    Special Requests   Final    NONE Performed at [facility], [address] [place], [place] [address]    Culture >=100,000 COLONIES/mL ESCHERICHIA COLI (A)  Final   Report Status PENDING  Incomplete    Radiology Reports Dg Chest 2 View  Result Date: 01/06/2019 CLINICAL DATA:  Abdominal pain with nausea, vomiting, and diarrhea. Currently undergoing treatment for lung cancer. EXAM: CHEST - 2 VIEW  COMPARISON:  Chest x-ray dated 07/07/2017 and CT scan of the chest dated 10/27/2018 FINDINGS: Power port in place with the tip in the superior vena cava just below the carina in good position. The heart size and pulmonary vascularity are normal. The patchy infiltrates in the right lung seen on the prior chest CT have resolved. There is scarring in the medial aspect of the left upper lobe. On the lateral view there is fullness in the aortopulmonary window consistent with the adenopathy noted on the recent CT scan. No effusions. There is also slight increased density medially at the left lung base felt to be secondary to prior radiation therapy, also as demonstrated on the prior CT scan. IMPRESSION: No acute abnormality in the chest. Electronically Signed   By: Lorriane Shire M.D.   On: 01/06/2019 13:05   Ct Abdomen Pelvis W Contrast  Result Date: 01/06/2019 CLINICAL DATA:  Nausea, vomiting and diarrhea with abdominal pain for the past 3 days. History of metastatic lung cancer with her last chemotherapy treatment on 12/29/2018. EXAM: CT ABDOMEN AND PELVIS WITH CONTRAST TECHNIQUE: Multidetector CT imaging of the abdomen and pelvis was performed using the standard protocol following bolus administration of intravenous contrast. CONTRAST:  188mL OMNIPAQUE IOHEXOL 300 MG/ML  SOLN COMPARISON:  11/01/2018.  PET-CT dated 02/09/2018. FINDINGS: Lower chest: Mildly progressive atelectasis in the medial aspect of the left lower lobe, partially surrounding the  descending thoracic aorta. Interval decrease in the small amount of adjacent pleural fluid/thickening. Mild dependent atelectasis in the right lower lobe. Hepatobiliary: Stable mild diffuse low density of the liver and cholecystectomy clips. Mild post cholecystectomy intrahepatic ductal dilatation with mild improvement. The previously demonstrated oval area of low density in the posterior aspect of the lateral segment of the left lobe of the liver appears smaller and  less prominent. This previously measured 2.5 cm in maximum diameter and currently measures 1.0 cm in maximum diameter on image number 27 series 2. Pancreas: Unremarkable. No pancreatic ductal dilatation or surrounding inflammatory changes. Spleen: Normal in size without focal abnormality. Adrenals/Urinary Tract: Normal appearing adrenal glands. Stable small upper pole right renal cyst. Interval mild right renal collecting system and ureter mucosal thickening and enhancement. Interval minimal similar changes on the left. Unremarkable urinary bladder. No urinary tract calculi or hydronephrosis seen. Stomach/Bowel: Moderately large hiatal hernia without significant change. Multiple colonic diverticula without evidence of diverticulitis. No evidence of appendicitis. Mild mucosal enhancement in the right colon, cecum and proximal transverse colon without significant thickening. Previously noted duodenal periampullary diverticulum. Vascular/Lymphatic: Atheromatous arterial calcifications without aneurysm. No enlarged lymph nodes. Reproductive: Status post hysterectomy. No adnexal masses. Other: No abdominal wall hernia or abnormality. No abdominopelvic ascites. Musculoskeletal: The previously demonstrated 2 cm oval sclerotic lesion in the L3 vertebral body is unchanged. Lumbar and lower thoracic spine degenerative changes. IMPRESSION: 1. Mild mucosal enhancement in the right colon, cecum and proximal transverse colon without significant thickening. This could be due to mild colitis. 2. Interval mild right renal collecting system and ureter mucosal thickening and enhancement. This can be seen with urinary tract infection. Interval minimal similar changes on the left. 3. Interval decrease in size of the previously demonstrated left lobe liver metastasis. 4. Stable moderate-sized hiatal hernia. 5. Stable L3 vertebral body sclerotic lesion compatible with a treated previously demonstrated metastasis. Electronically Signed    By: Claudie Revering M.D.   On: 01/06/2019 15:36    Time Spent in minutes  25   Keishawn Darsey M.D on 01/07/2019 at 3:53 PM  Between 7am to 7pm - Pager - [phone]  After 7pm go to [email or web address] - password [facility]  [facility] -  Office  [phone]

## 2019-01-07 NOTE — Telephone Encounter (Signed)
Pt admitted to [facility] w colitis and UTI and asking if she should have CT chest tomorrow as scheduled? She had CT abd yesterday.  I told her to get CT chest if she can as inpt if not we will r/s. I told her to keep [place] informed of her tx plan at [facility].

## 2019-01-07 NOTE — ED Notes (Signed)
Pt had one large bowel movement. Fluid consistency.  Pt states she has not had anything to eat all day. Pt drank broth while in the emergency room and shortly after had bowel movement.

## 2019-01-07 NOTE — ED Notes (Signed)
Pt has just had second bowel movement. Liquid in consistency.  2nd liquid bowel movement since 6 hours ago.

## 2019-01-07 NOTE — Telephone Encounter (Signed)
err

## 2019-01-08 ENCOUNTER — Ambulatory Visit (HOSPITAL_COMMUNITY): Admission: RE | Admit: 2019-01-08 | Payer: Medicare Other | Source: Ambulatory Visit

## 2019-01-08 ENCOUNTER — Encounter (HOSPITAL_COMMUNITY): Payer: Self-pay

## 2019-01-08 ENCOUNTER — Inpatient Hospital Stay (HOSPITAL_COMMUNITY): Payer: Medicare Other

## 2019-01-08 DIAGNOSIS — T451X5A Adverse effect of antineoplastic and immunosuppressive drugs, initial encounter: Secondary | ICD-10-CM | POA: Diagnosis present

## 2019-01-08 DIAGNOSIS — D61810 Antineoplastic chemotherapy induced pancytopenia: Secondary | ICD-10-CM | POA: Diagnosis present

## 2019-01-08 DIAGNOSIS — C3492 Malignant neoplasm of unspecified part of left bronchus or lung: Secondary | ICD-10-CM

## 2019-01-08 DIAGNOSIS — K589 Irritable bowel syndrome without diarrhea: Secondary | ICD-10-CM | POA: Diagnosis present

## 2019-01-08 DIAGNOSIS — C787 Secondary malignant neoplasm of liver and intrahepatic bile duct: Secondary | ICD-10-CM | POA: Diagnosis present

## 2019-01-08 DIAGNOSIS — E860 Dehydration: Secondary | ICD-10-CM | POA: Diagnosis present

## 2019-01-08 DIAGNOSIS — D6481 Anemia due to antineoplastic chemotherapy: Secondary | ICD-10-CM | POA: Diagnosis present

## 2019-01-08 DIAGNOSIS — R000 Tachycardia, unspecified: Secondary | ICD-10-CM | POA: Diagnosis present

## 2019-01-08 DIAGNOSIS — M797 Fibromyalgia: Secondary | ICD-10-CM | POA: Diagnosis present

## 2019-01-08 DIAGNOSIS — D6959 Other secondary thrombocytopenia: Secondary | ICD-10-CM | POA: Diagnosis present

## 2019-01-08 DIAGNOSIS — C3412 Malignant neoplasm of upper lobe, left bronchus or lung: Secondary | ICD-10-CM | POA: Diagnosis present

## 2019-01-08 DIAGNOSIS — D696 Thrombocytopenia, unspecified: Secondary | ICD-10-CM | POA: Diagnosis not present

## 2019-01-08 DIAGNOSIS — E876 Hypokalemia: Secondary | ICD-10-CM | POA: Diagnosis present

## 2019-01-08 DIAGNOSIS — E039 Hypothyroidism, unspecified: Secondary | ICD-10-CM | POA: Diagnosis present

## 2019-01-08 DIAGNOSIS — K219 Gastro-esophageal reflux disease without esophagitis: Secondary | ICD-10-CM | POA: Diagnosis present

## 2019-01-08 DIAGNOSIS — A084 Viral intestinal infection, unspecified: Secondary | ICD-10-CM | POA: Diagnosis present

## 2019-01-08 DIAGNOSIS — D649 Anemia, unspecified: Secondary | ICD-10-CM

## 2019-01-08 DIAGNOSIS — B9629 Other Escherichia coli [E. coli] as the cause of diseases classified elsewhere: Secondary | ICD-10-CM | POA: Diagnosis present

## 2019-01-08 DIAGNOSIS — Z95828 Presence of other vascular implants and grafts: Secondary | ICD-10-CM | POA: Diagnosis not present

## 2019-01-08 DIAGNOSIS — Z1612 Extended spectrum beta lactamase (ESBL) resistance: Secondary | ICD-10-CM | POA: Diagnosis present

## 2019-01-08 DIAGNOSIS — I10 Essential (primary) hypertension: Secondary | ICD-10-CM | POA: Diagnosis present

## 2019-01-08 DIAGNOSIS — F419 Anxiety disorder, unspecified: Secondary | ICD-10-CM | POA: Diagnosis present

## 2019-01-08 DIAGNOSIS — Z9981 Dependence on supplemental oxygen: Secondary | ICD-10-CM | POA: Diagnosis not present

## 2019-01-08 DIAGNOSIS — N390 Urinary tract infection, site not specified: Secondary | ICD-10-CM

## 2019-01-08 DIAGNOSIS — B9620 Unspecified Escherichia coli [E. coli] as the cause of diseases classified elsewhere: Secondary | ICD-10-CM | POA: Diagnosis present

## 2019-01-08 DIAGNOSIS — J449 Chronic obstructive pulmonary disease, unspecified: Secondary | ICD-10-CM | POA: Diagnosis present

## 2019-01-08 DIAGNOSIS — J9611 Chronic respiratory failure with hypoxia: Secondary | ICD-10-CM | POA: Diagnosis present

## 2019-01-08 DIAGNOSIS — R112 Nausea with vomiting, unspecified: Secondary | ICD-10-CM | POA: Diagnosis present

## 2019-01-08 LAB — CBC
HCT: 24.1 % — ABNORMAL LOW (ref 36.0–46.0)
HEMOGLOBIN: 7.8 g/dL — AB (ref 12.0–15.0)
MCH: 31.7 pg (ref 26.0–34.0)
MCHC: 32.4 g/dL (ref 30.0–36.0)
MCV: 98 fL (ref 80.0–100.0)
Platelets: 72 10*3/uL — ABNORMAL LOW (ref 150–400)
RBC: 2.46 MIL/uL — ABNORMAL LOW (ref 3.87–5.11)
RDW: 19.5 % — ABNORMAL HIGH (ref 11.5–15.5)
WBC: 2.7 10*3/uL — ABNORMAL LOW (ref 4.0–10.5)
nRBC: 0 % (ref 0.0–0.2)

## 2019-01-08 LAB — BASIC METABOLIC PANEL
ANION GAP: 7 (ref 5–15)
BUN: <5 mg/dL — ABNORMAL LOW (ref 8–23)
CO2: 24 mmol/L (ref 22–32)
Calcium: 7.5 mg/dL — ABNORMAL LOW (ref 8.9–10.3)
Chloride: 110 mmol/L (ref 98–111)
Creatinine, Ser: 0.47 mg/dL (ref 0.44–1.00)
GFR calc Af Amer: >60 mL/min (ref >60)
GFR calc non Af Amer: >60 mL/min (ref >60)
Glucose, Bld: 84 mg/dL (ref 70–99)
Potassium: 3.6 mmol/L (ref 3.5–5.1)
Sodium: 141 mmol/L (ref 135–145)

## 2019-01-08 LAB — URINE CULTURE: Culture: >=100000 — AB

## 2019-01-08 LAB — MAGNESIUM: Magnesium: 1.5 mg/dL — ABNORMAL LOW (ref 1.7–2.4)

## 2019-01-08 MED ORDER — POTASSIUM CHLORIDE IN NACL 20-0.9 MEQ/L-% IV SOLN
INTRAVENOUS | Status: DC
  Administered 2019-01-08: 09:00:00 via INTRAVENOUS

## 2019-01-08 MED ORDER — CYCLOBENZAPRINE HCL 10 MG PO TABS: 10.0000 mg | ORAL_TABLET | Freq: Once | ORAL | Status: DC | PRN

## 2019-01-08 MED ORDER — HEPARIN SOD (PORK) LOCK FLUSH 100 UNIT/ML IV SOLN
500.0000 [IU] | INTRAVENOUS | Status: DC | PRN
  Filled 2019-01-08: qty 5

## 2019-01-08 MED ORDER — NITROFURANTOIN MONOHYD MACRO 100 MG PO CAPS: 100.0000 mg | ORAL_CAPSULE | Freq: Two times a day (BID) | ORAL | 0 refills | Status: DC

## 2019-01-08 MED ORDER — TEMAZEPAM 15 MG PO CAPS: 15.0000 mg | ORAL_CAPSULE | Freq: Every day | ORAL | Status: DC

## 2019-01-08 MED ORDER — MAGNESIUM OXIDE 400 (241.3 MG) MG PO TABS
400.0000 mg | ORAL_TABLET | Freq: Two times a day (BID) | ORAL | Status: DC
  Administered 2019-01-08: 400 mg via ORAL
  Filled 2019-01-08: qty 1

## 2019-01-08 MED ORDER — METOPROLOL SUCCINATE ER 25 MG PO TB24
25.0000 mg | ORAL_TABLET | Freq: Every day | ORAL | Status: DC
  Administered 2019-01-08: 25 mg via ORAL
  Filled 2019-01-08: qty 1

## 2019-01-08 MED ORDER — IOHEXOL 300 MG/ML  SOLN
75.0000 mL | Freq: Once | INTRAMUSCULAR | Status: AC | PRN
  Administered 2019-01-08: 75 mL via INTRAVENOUS

## 2019-01-08 NOTE — Progress Notes (Signed)
Port flushed with Heparin, then de-accessed. D/C instructions given to pt. Verbalized understanding. Pt friend at bedside to transport home.

## 2019-01-08 NOTE — Discharge Summary (Signed)
Physician Discharge Summary  Rebekah Henderson YTK:[id number] DOB: 02/22/1956 DOA: 01/06/2019  PCP: Redmond School, MD  Admit date: 01/06/2019 Discharge date: 01/08/2019  Admitted From: home Disposition: home  Recommendations for Outpatient Follow-up:  1. Follow up with PCP in 1 week.  Patient will complete 5 more days of oral nitrofurantoin (total 7 days). 2. Follow-up with her oncologist as scheduled. 3.   Home Health: PT Equipment/Devices: None  Discharge Condition: Fair CODE STATUS: Full code Diet recommendation: Regular    Discharge Diagnoses:  Principal Problem:   Viral gastroenteritis  Active Problems:    UTI due to extended-spectrum beta lactamase (ESBL) producing Escherichia coli   COPD (chronic obstructive pulmonary disease) (HCC) Essential hypertension   Hypothyroidism   Small cell lung cancer, left (HCC)   Hypokalemia   Hypomagnesemia   Brief narrative/HPI 63 year old female with a history of COPD, hypertension, small cell lung cancer on systemic chemotherapy and radiation, hypothyroidism presented to the ED with 3-day history of nausea, vomiting and persistent watery diarrhea.  Her last chemotherapy was on 1/28.  Symptoms started a few days after the chemotherapy.  Also had a suprapubic discomfort with dysuria and subjective fevers.  Also reported increasing shortness of breath requiring supplemental home O2 and poor appetite. Patient was treated with ESBL E. coli UTI 4 weeks back (with nitrofurantoin). In the ED she was hypotensive with blood pressure of 97/56 mmHg, potassium of 3.2, magnesium 1.1 and platelets of 80 2K.  UA positive for UTI.  Chest x-ray negative for infiltrate.  CT abdomen pelvis with contrast showed mild mucosal enhancement in the right colon, cecum, proximal transverse colon (possible for mild colitis). Patient given 2 L normal saline bolus, IV potassium, IV magnesium and placed on empiric Cipro and Flagyl. Hospitalist consulted for further  management.    Hospital course Acute gastroenteritis Suspect symptoms post chemotherapy with viral gastroenteritis.  Received IV hydration. Diarrhea much improved today and heart rate stable.   Hypokalemia/hypomagnesemia Replenished.  Dehydration with tachycardia Resumed IV fluids.  Heart rate stable and patient feels much better this afternoon.  COPD Wheezing on admission currently resolved.  Stable on room air.  ESBL E. coli UTI. Sensitive to meropenem, Zosyn and nitrofurantoin.  Was being treated with meropenem while in the hospital.  Will discharge on nitrofurantoin for 5 more days and will have received total 8 days of antibiotics.  Pancytopenia Secondary to chemotherapy.  Low but stable.  Follow as outpatient.  Small cell lung cancer On systemic chemotherapy and radiation and follows with Dr. Julien Nordmann.  Was scheduled for CT of the chest, abdomen pelvis with contrast tomorrow.    She had a CT of the abdomen and pelvis with contrast in the ED which shows interval decrease in size of left lobe liver metastasis. CT of the chest with contrast done today showed no significant change in the soft tissue mass measuring 2.9 cm.  Shows interval increase in bilateral upper lobe paramediastinal scarring and volume loss.  Follow-up with her oncologist as scheduled.   Essential hypertension Metoprolol and amlodipine held due to hypotension on presentation.  Resume upon discharge.   Patient feels better to go home today.  Seen by PT who recommends home health.  Stable to be discharged with outpatient follow-up.     Family Communication  : None at bedside  Disposition Plan  :  Home  Consults  : None  Procedures  : CT abdomen pelvis with contrast, CT chest with contrast   Discharge Instructions  Allergies as of 01/08/2019      Reactions   Penicillins Hives, Swelling, Other (See Comments)   Reaction:  Face/mouth swelling  Has patient had a PCN reaction causing  immediate rash, facial/tongue/throat swelling, SOB or lightheadedness with hypotension: Yes Has patient had a PCN reaction causing severe rash involving mucus membranes or skin necrosis: No Has patient had a PCN reaction that required hospitalization No Has patient had a PCN reaction occurring within the last 10 years: No If all of the above answers are "NO", then may proceed with Cephalosporin use.   Codeine Nausea Only   Fentanyl Nausea And Vomiting   restless   Ranitidine Hcl Other (See Comments), Nausea Only   Reaction:  Dizziness    Keflex [cephalexin] Other (See Comments)   Reaction:  Unknown    Lyrica [pregabalin] Other (See Comments)   Reaction:  Somnolence       Medication List    STOP taking these medications   methylPREDNISolone 4 MG Tbpk tablet Commonly known as:  MEDROL DOSEPAK     TAKE these medications   albuterol 108 (90 Base) MCG/ACT inhaler Commonly known as:  PROVENTIL HFA;VENTOLIN HFA Inhale 2 puffs into the lungs every 4 (four) hours as needed for wheezing or shortness of breath.   ALPRAZolam 0.25 MG tablet Commonly known as:  XANAX Take 2 tablets (0.5 mg total) by mouth at bedtime as needed for anxiety.   amLODipine 5 MG tablet Commonly known as:  NORVASC Take 5 mg by mouth daily.   cyclobenzaprine 10 MG tablet Commonly known as:  FLEXERIL Take 10 mg by mouth once as needed for muscle spasms.   dicyclomine 20 MG tablet Commonly known as:  BENTYL Take 20 mg by mouth 3 (three) times daily as needed for spasms.   diphenoxylate-atropine 2.5-0.025 MG tablet Commonly known as:  LOMOTIL Take 1 tablet by mouth 4 (four) times daily as needed for diarrhea or loose stools (take if immodium does not work).   estazolam 2 MG tablet Commonly known as:  PROSOM Take 2 mg by mouth at bedtime.   estradiol 2 MG tablet Commonly known as:  ESTRACE Take 2 mg by mouth daily.   FLUoxetine 40 MG capsule Commonly known as:  PROZAC Take 40 mg by mouth daily.    HYDROcodone-acetaminophen 5-325 MG tablet Commonly known as:  NORCO/VICODIN Take 1 tablet by mouth every 4 (four) hours as needed for moderate pain.   levothyroxine 50 MCG tablet Commonly known as:  SYNTHROID, LEVOTHROID Take 50 mcg by mouth daily before breakfast.   lidocaine-prilocaine cream Commonly known as:  EMLA Apply 1 application topically as needed. To numb skin over port a cath: Squeeze a  small amount on cotton ball and place over port site 1-2 hours prior to chemotherapy.   loperamide 2 MG capsule Commonly known as:  IMODIUM Take 2 mg by mouth every 2 (two) hours as needed for diarrhea or loose stools.   magnesium oxide 400 (241.3 Mg) MG tablet Commonly known as:  MAG-OX Take 1 tablet (400 mg total) by mouth 2 (two) times daily.   metoprolol succinate 25 MG 24 hr tablet Commonly known as:  TOPROL-XL Take 25 mg by mouth daily.   nitrofurantoin (macrocrystal-monohydrate) 100 MG capsule Commonly known as:  MACROBID Take 1 capsule (100 mg total) by mouth 2 (two) times daily.   ondansetron 4 MG disintegrating tablet Commonly known as:  ZOFRAN-ODT Dissolve one tablet by mouth every 8 hours as needed for nausea and vomiting,  pantoprazole 40 MG tablet Commonly known as:  PROTONIX Take 40 mg by mouth 2 (two) times daily before a meal.   potassium chloride SA 20 MEQ tablet Commonly known as:  K-DUR,KLOR-CON Take 20 mEq by mouth daily as needed (for supplement).   promethazine 25 MG tablet Commonly known as:  PHENERGAN TAKE ONE TABLET BY MOUTH EVERY 6 HOURS AS NEEDED. What changed:  reasons to take this   senna-docusate 8.6-50 MG tablet Commonly known as:  Senokot-S Take 1 tablet by mouth daily as needed for mild constipation or moderate constipation.   tizanidine 2 MG capsule Commonly known as:  ZANAFLEX Take 1 capsule (2 mg total) by mouth 3 (three) times daily as needed for muscle spasms.      Follow-up Information    Redmond School, MD. Schedule an  appointment as soon as possible for a visit in 1 week(s).   Specialty:  Internal Medicine Contact information: [address] [place] [address] [phone]        Curt Bears, MD Follow up.   Specialty:  Oncology Why:  as scheduled Contact information: [address] [place] [address] [phone]          Allergies  Allergen Reactions  . Penicillins Hives, Swelling and Other (See Comments)    Reaction:  Face/mouth swelling  Has patient had a PCN reaction causing immediate rash, facial/tongue/throat swelling, SOB or lightheadedness with hypotension: Yes Has patient had a PCN reaction causing severe rash involving mucus membranes or skin necrosis: No Has patient had a PCN reaction that required hospitalization No Has patient had a PCN reaction occurring within the last 10 years: No If all of the above answers are "NO", then may proceed with Cephalosporin use.  . Codeine Nausea Only  . Fentanyl Nausea And Vomiting    restless  . Ranitidine Hcl Other (See Comments) and Nausea Only    Reaction:  Dizziness   . Keflex [Cephalexin] Other (See Comments)    Reaction:  Unknown   . Lyrica [Pregabalin] Other (See Comments)    Reaction:  Somnolence       Procedures/Studies: Dg Chest 2 View  Result Date: 01/06/2019 CLINICAL DATA:  Abdominal pain with nausea, vomiting, and diarrhea. Currently undergoing treatment for lung cancer. EXAM: CHEST - 2 VIEW COMPARISON:  Chest x-ray dated 07/07/2017 and CT scan of the chest dated 10/27/2018 FINDINGS: Power port in place with the tip in the superior vena cava just below the carina in good position. The heart size and pulmonary vascularity are normal. The patchy infiltrates in the right lung seen on the prior chest CT have resolved. There is scarring in the medial aspect of the left upper lobe. On the lateral view there is fullness in the aortopulmonary window consistent with the adenopathy noted on the recent CT  scan. No effusions. There is also slight increased density medially at the left lung base felt to be secondary to prior radiation therapy, also as demonstrated on the prior CT scan. IMPRESSION: No acute abnormality in the chest. Electronically Signed   By: Lorriane Shire M.D.   On: 01/06/2019 13:05   Ct Chest W Contrast  Result Date: 01/08/2019 CLINICAL DATA:  Small-cell lung cancer, restaging EXAM: CT CHEST WITH CONTRAST TECHNIQUE: Multidetector CT imaging of the chest was performed during intravenous contrast administration. CONTRAST:  90mL OMNIPAQUE IOHEXOL 300 MG/ML  SOLN COMPARISON:  10/27/2018 FINDINGS: Cardiovascular: No significant vascular findings. Normal heart size. No pericardial effusion. Right chest port catheter. Mediastinum/Nodes: No  significant change in a soft tissue mass centered about the AP window measuring approximately 2.9 cm with associated abnormal left hilar soft tissue. (Series 2, image 51). Thyroid gland, trachea, and esophagus demonstrate no significant findings. Lungs/Pleura: Mild centrilobular emphysema. There has been interval increase in bilateral upper lobe paramediastinal scarring and volume loss, more conspicuous on the right, in keeping with evolution of radiation change. Multifocal ground-glass and consolidative opacities of the right lung with central clearing have resolved. Bibasilar bandlike scarring or atelectasis. Trace left pleural fluid. Upper Abdomen: No acute abnormality. Please see recent separately reported CT examination of the abdomen and pelvis. Musculoskeletal: No chest wall mass or suspicious bone lesions identified. IMPRESSION: 1. No significant change in a soft tissue mass centered about the AP window measuring approximately 2.9 cm with associated abnormal left hilar soft tissue. (Series 2, image 51). 2. There has been interval increase in bilateral upper lobe paramediastinal scarring and volume loss, more conspicuous on the right, in keeping with evolution  of radiation change. 3. Multifocal ground-glass and consolidative opacities of the right lung seen on prior examination have resolved. 4.  Emphysema. Electronically Signed   By: Eddie Candle M.D.   On: 01/08/2019 11:23   Ct Abdomen Pelvis W Contrast  Result Date: 01/06/2019 CLINICAL DATA:  Nausea, vomiting and diarrhea with abdominal pain for the past 3 days. History of metastatic lung cancer with her last chemotherapy treatment on 12/29/2018. EXAM: CT ABDOMEN AND PELVIS WITH CONTRAST TECHNIQUE: Multidetector CT imaging of the abdomen and pelvis was performed using the standard protocol following bolus administration of intravenous contrast. CONTRAST:  118mL OMNIPAQUE IOHEXOL 300 MG/ML  SOLN COMPARISON:  11/01/2018.  PET-CT dated 02/09/2018. FINDINGS: Lower chest: Mildly progressive atelectasis in the medial aspect of the left lower lobe, partially surrounding the descending thoracic aorta. Interval decrease in the small amount of adjacent pleural fluid/thickening. Mild dependent atelectasis in the right lower lobe. Hepatobiliary: Stable mild diffuse low density of the liver and cholecystectomy clips. Mild post cholecystectomy intrahepatic ductal dilatation with mild improvement. The previously demonstrated oval area of low density in the posterior aspect of the lateral segment of the left lobe of the liver appears smaller and less prominent. This previously measured 2.5 cm in maximum diameter and currently measures 1.0 cm in maximum diameter on image number 27 series 2. Pancreas: Unremarkable. No pancreatic ductal dilatation or surrounding inflammatory changes. Spleen: Normal in size without focal abnormality. Adrenals/Urinary Tract: Normal appearing adrenal glands. Stable small upper pole right renal cyst. Interval mild right renal collecting system and ureter mucosal thickening and enhancement. Interval minimal similar changes on the left. Unremarkable urinary bladder. No urinary tract calculi or  hydronephrosis seen. Stomach/Bowel: Moderately large hiatal hernia without significant change. Multiple colonic diverticula without evidence of diverticulitis. No evidence of appendicitis. Mild mucosal enhancement in the right colon, cecum and proximal transverse colon without significant thickening. Previously noted duodenal periampullary diverticulum. Vascular/Lymphatic: Atheromatous arterial calcifications without aneurysm. No enlarged lymph nodes. Reproductive: Status post hysterectomy. No adnexal masses. Other: No abdominal wall hernia or abnormality. No abdominopelvic ascites. Musculoskeletal: The previously demonstrated 2 cm oval sclerotic lesion in the L3 vertebral body is unchanged. Lumbar and lower thoracic spine degenerative changes. IMPRESSION: 1. Mild mucosal enhancement in the right colon, cecum and proximal transverse colon without significant thickening. This could be due to mild colitis. 2. Interval mild right renal collecting system and ureter mucosal thickening and enhancement. This can be seen with urinary tract infection. Interval minimal similar changes on the left.  3. Interval decrease in size of the previously demonstrated left lobe liver metastasis. 4. Stable moderate-sized hiatal hernia. 5. Stable L3 vertebral body sclerotic lesion compatible with a treated previously demonstrated metastasis. Electronically Signed   By: Claudie Revering M.D.   On: 01/06/2019 15:36    (Echo, Carotid, EGD, Colonoscopy, ERCP)    Subjective:   Discharge Exam: Vitals:   01/08/19 0811 01/08/19 1522  BP:    Pulse:    Resp:    Temp:    SpO2: 99% 97%   Vitals:   01/08/19 0500 01/08/19 0605 01/08/19 0811 01/08/19 1522  BP:  (!) 127/54    Pulse:  74    Resp:  20    Temp:  98.1 F (36.7 C)    TempSrc:  Oral    SpO2:  100% 99% 97%  Weight: 75.7 kg     Height:        General: Not in distress HEENT: Pallor present, moist mucosa, supple neck Chest: Clear bilaterally CVs: Normal S1-S2, no  murmurs GI: Soft, nondistended, nontender Musculoskeletal: Warm, no edema    The results of significant diagnostics from this hospitalization (including imaging, microbiology, ancillary and laboratory) are listed below for reference.     Microbiology: Recent Results (from the past 240 hour(s))  Urine culture     Status: Abnormal   Collection Time: 01/06/19  2:30 PM  Result Value Ref Range Status   Specimen Description   Final    URINE, CLEAN CATCH Performed at [facility], [address], [place], [place] [address]    Special Requests   Final    NONE Performed at [facility], [address], [place], [place] [address]    Culture (A)  Final    >=100,000 COLONIES/mL ESCHERICHIA COLI Confirmed Extended Spectrum Beta-Lactamase Producer (ESBL).  In bloodstream infections from ESBL organisms, carbapenems are preferred over piperacillin/tazobactam. They are shown to have a lower risk of mortality.    Report Status 01/08/2019 FINAL  Final   Organism ID, Bacteria ESCHERICHIA COLI (A)  Final      Susceptibility   Escherichia coli - MIC*    AMPICILLIN >=32 RESISTANT Resistant     CEFAZOLIN >=64 RESISTANT Resistant     CEFTRIAXONE >=64 RESISTANT Resistant     CIPROFLOXACIN >=4 RESISTANT Resistant     GENTAMICIN <=1 SENSITIVE Sensitive     IMIPENEM <=0.25 SENSITIVE Sensitive     NITROFURANTOIN <=16 SENSITIVE Sensitive     TRIMETH/SULFA >=320 RESISTANT Resistant     AMPICILLIN/SULBACTAM >=32 RESISTANT Resistant     PIP/TAZO <=4 SENSITIVE Sensitive     Extended ESBL POSITIVE Resistant     * >=100,000 COLONIES/mL ESCHERICHIA COLI     Labs: BNP (last 3 results) No results for input(s): BNP in the last 8760 hours. Basic Metabolic Panel: Recent Labs  Lab 01/06/19 1116 01/07/19 0531 01/08/19 0523  NA 138 141 141  K 3.2* 3.3* 3.6  CL 108 114* 110  CO2 23 23 24   GLUCOSE 95 86 84  BUN 9 5* <5*  CREATININE 0.67 0.51 0.47  CALCIUM 7.1* 6.8* 7.5*  MG 1.1* 1.2* 1.5*   Liver  Function Tests: Recent Labs  Lab 01/06/19 1116  AST 12*  ALT 8  ALKPHOS 35*  BILITOT 0.7  PROT 5.3*  ALBUMIN 3.2*   Recent Labs  Lab 01/06/19 1116  LIPASE 21   No results for input(s): AMMONIA in the last 168 hours. CBC: Recent Labs  Lab 01/06/19 1116 01/07/19 0531 01/08/19 4970  WBC 3.6* 2.8* 2.7*  NEUTROABS 2.0  --   --   HGB 7.9* 7.0* 7.8*  HCT 24.5* 21.4* 24.1*  MCV 97.2 98.2 98.0  PLT 82* 67* 72*   Cardiac Enzymes: Recent Labs  Lab 01/06/19 1116  TROPONINI <0.03   BNP: Invalid input(s): POCBNP CBG: No results for input(s): GLUCAP in the last 168 hours. D-Dimer No results for input(s): DDIMER in the last 72 hours. Hgb A1c No results for input(s): HGBA1C in the last 72 hours. Lipid Profile No results for input(s): CHOL, HDL, LDLCALC, TRIG, CHOLHDL, LDLDIRECT in the last 72 hours. Thyroid function studies No results for input(s): TSH, T4TOTAL, T3FREE, THYROIDAB in the last 72 hours.  Invalid input(s): FREET3 Anemia work up No results for input(s): VITAMINB12, FOLATE, FERRITIN, TIBC, IRON, RETICCTPCT in the last 72 hours. Urinalysis    Component Value Date/Time   COLORURINE AMBER (A) 01/06/2019 1430   APPEARANCEUR CLEAR 01/06/2019 1430   LABSPEC 1.003 (L) 01/06/2019 1430   PHURINE 7.0 01/06/2019 1430   GLUCOSEU NEGATIVE 01/06/2019 1430   HGBUR SMALL (A) 01/06/2019 1430   BILIRUBINUR NEGATIVE 01/06/2019 1430   KETONESUR NEGATIVE 01/06/2019 1430   PROTEINUR NEGATIVE 01/06/2019 1430   UROBILINOGEN 0.2 03/10/2014 1027   NITRITE POSITIVE (A) 01/06/2019 1430   LEUKOCYTESUR TRACE (A) 01/06/2019 1430   Sepsis Labs Invalid input(s): PROCALCITONIN,  WBC,  LACTICIDVEN Microbiology Recent Results (from the past 240 hour(s))  Urine culture     Status: Abnormal   Collection Time: 01/06/19  2:30 PM  Result Value Ref Range Status   Specimen Description   Final    URINE, CLEAN CATCH Performed at [facility], [address], [place], [place] [address]     Special Requests   Final    NONE Performed at [facility], [address] [place], [place] [address]    Culture (A)  Final    >=100,000 COLONIES/mL ESCHERICHIA COLI Confirmed Extended Spectrum Beta-Lactamase Producer (ESBL).  In bloodstream infections from ESBL organisms, carbapenems are preferred over piperacillin/tazobactam. They are shown to have a lower risk of mortality.    Report Status 01/08/2019 FINAL  Final   Organism ID, Bacteria ESCHERICHIA COLI (A)  Final      Susceptibility   Escherichia coli - MIC*    AMPICILLIN >=32 RESISTANT Resistant     CEFAZOLIN >=64 RESISTANT Resistant     CEFTRIAXONE >=64 RESISTANT Resistant     CIPROFLOXACIN >=4 RESISTANT Resistant     GENTAMICIN <=1 SENSITIVE Sensitive     IMIPENEM <=0.25 SENSITIVE Sensitive     NITROFURANTOIN <=16 SENSITIVE Sensitive     TRIMETH/SULFA >=320 RESISTANT Resistant     AMPICILLIN/SULBACTAM >=32 RESISTANT Resistant     PIP/TAZO <=4 SENSITIVE Sensitive     Extended ESBL POSITIVE Resistant     * >=100,000 COLONIES/mL ESCHERICHIA COLI     Time coordinating discharge: <30 minutes  SIGNED:   Louellen Molder, MD  [facility] 01/08/2019, 5:39 PM Pager   If 7PM-7AM, please contact night-coverage [email or web address] Password [email or web address]

## 2019-01-08 NOTE — Evaluation (Signed)
Physical Therapy Evaluation Patient Details Name: CINTHIA RODDEN MRN: [id number] DOB: 10/13/1956 Today's Date: 01/08/2019   History of Present Illness  Hoa W Littles is a 63 y.o. 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  Had her last chemotherapy treatment on 12/29/2018.  She says she did well for few days afterwards but then had a sudden onset of nausea, vomiting, diarrhea with associated suprapubic discomfort and dysuria.  She reports occasional nighttime fevers without diaphoresis.  She has had increased shortness of breath from baseline requiring her to use supplemental O2 at home.  She reports poor appetite the last 2 days.  She denies any obvious bleeding including hemoptysis, hematemesis, hematuria, hematochezia, or melena.  Due to continued symptoms she called EMS.  On their arrival she was noted to have SBP in the 80s.    Clinical Impression  Patient functioning near baseline for functional mobility and gait, other than generalized weakness possibly due dehydration per patient, able to ambulate to bathroom and into hallway with slow slightly labored cadence without loss of balance, limited mostly due to fatigue.  Patient tolerated sitting up in chair after therapy.  Patient will benefit from continued physical therapy in hospital and recommended venue below to increase strength, balance, endurance for safe ADLs and gait.     Follow Up Recommendations Home health PT;Supervision - Intermittent    Equipment Recommendations  None recommended by PT    Recommendations for Other Services       Precautions / Restrictions Precautions Precautions: Fall Restrictions Weight Bearing Restrictions: No      Mobility  Bed Mobility Overal bed mobility: Modified Independent             General bed mobility comments:  increased time  Transfers Overall transfer level: Modified independent               General transfer comment: increased time  Ambulation/Gait Ambulation/Gait assistance: Supervision Gait Distance (Feet): 40 Feet   Gait Pattern/deviations: Decreased step length - right;Decreased step length - left;Decreased stride length Gait velocity: decreased   General Gait Details: slightly labored slow cadence without loss of balance while on 2 LPM with O2 saturation at 94-96%, limited mostly due to c/o fatigue  Stairs            Wheelchair Mobility    Modified Rankin (Stroke Patients Only)       Balance Overall balance assessment: Mild deficits observed, not formally tested                                           Pertinent Vitals/Pain Pain Assessment: 0-10 Pain Score: 4  Pain Location: stomach due to diarreha Pain Descriptors / Indicators: Aching;Discomfort Pain Intervention(s): Limited activity within patient's tolerance;Monitored during session    Home Living Family/patient expects to be discharged to:: Private residence Living Arrangements: Alone Available Help at Discharge: Friend(s) Type of Home: House Home Access: Stairs to enter Entrance Stairs-Rails: None Entrance Stairs-Number of Steps: 2 Home Layout: One level Home Equipment: Environmental consultant - 2 wheels;Cane - single point;Shower seat      Prior Function Level of Independence: Independent         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  Extremity/Trunk Assessment   Upper Extremity Assessment Upper Extremity Assessment: Overall WFL for tasks assessed    Lower Extremity Assessment Lower Extremity Assessment: Generalized weakness    Cervical / Trunk Assessment Cervical / Trunk Assessment: Normal  Communication   Communication: No difficulties  Cognition Arousal/Alertness: Awake/alert Behavior During Therapy: WFL for tasks  assessed/performed Overall Cognitive Status: Within Functional Limits for tasks assessed                                        General Comments      Exercises     Assessment/Plan    PT Assessment Patient needs continued PT services  PT Problem List Decreased strength;Decreased activity tolerance;Decreased balance;Decreased mobility       PT Treatment Interventions Gait training;Stair training;Functional mobility training;Therapeutic activities;Patient/family education;Therapeutic exercise    PT Goals (Current goals can be found in the Care Plan section)  Acute Rehab PT Goals Patient Stated Goal: return home Time For Goal Achievement: 01/11/19 Potential to Achieve Goals: Good    Frequency Min 3X/week   Barriers to discharge        Co-evaluation               AM-PAC PT "6 Clicks" Mobility  Outcome Measure Help needed turning from your back to your side while in a flat bed without using bedrails?: None Help needed moving from lying on your back to sitting on the side of a flat bed without using bedrails?: None Help needed moving to and from a bed to a chair (including a wheelchair)?: None Help needed standing up from a chair using your arms (e.g., wheelchair or bedside chair)?: None Help needed to walk in hospital room?: A Little Help needed climbing 3-5 steps with a railing? : A Little 6 Click Score: 22    End of Session Equipment Utilized During Treatment: Oxygen Activity Tolerance: Patient tolerated treatment well;Patient limited by fatigue Patient left: in chair;with call bell/phone within reach Nurse Communication: Mobility status PT Visit Diagnosis: Unsteadiness on feet (R26.81);Other abnormalities of gait and mobility (R26.89);Muscle weakness (generalized) (M62.81)    Time: 4332-9518 PT Time Calculation (min) (ACUTE ONLY): 28 min   Charges:   PT Evaluation $PT Eval Moderate Complexity: 1 Mod PT Treatments $Gait Training: 8-22  mins        12:30 PM, 01/08/19 Lonell Grandchild, MPT Physical Therapist with [facility] [phone] office [phone] mobile phone

## 2019-01-08 NOTE — Progress Notes (Signed)
PROGRESS NOTE                                                                                                                                                                                                             Patient Demographics:    Rebekah Henderson, is a 63 y.o. female, DOB - [birth year], MWN:[id number]  Admit date - 01/06/2019   Admitting Physician Lenore Cordia, MD  Outpatient Primary MD for the patient is Redmond School, MD  LOS - 0  Outpatient Specialists: Dr Julien Nordmann  Chief Complaint  Patient presents with  . Abdominal Pain       Brief Narrative   63 year old female with a history of COPD, hypertension, small cell lung cancer on systemic chemotherapy and radiation, hypothyroidism presented to the ED with 3-day history of nausea, vomiting and persistent watery diarrhea.  Her last chemotherapy was on 1/28.  Symptoms started a few days after the chemotherapy.  Also had a suprapubic discomfort with dysuria and subjective fevers.  Also reported increasing shortness of breath requiring supplemental home O2 and poor appetite. Patient was treated with ESBL E. coli UTI 4 weeks back (with nitrofurantoin). In the ED she was hypotensive with blood pressure of 97/56 mmHg, potassium of 3.2, magnesium 1.1 and platelets of 80 2K.  UA positive for UTI.  Chest x-ray negative for infiltrate.  CT abdomen pelvis with contrast showed mild mucosal enhancement in the right colon, cecum, proximal transverse colon (possible for mild colitis). Patient given 2 L normal saline bolus, IV potassium, IV magnesium and placed on empiric Cipro and Flagyl. Hospitalist consulted for further management.     Subjective:   Still feeling very weak.  Diarrhea better (had 2 episodes this morning).  Became tachycardic to 120s and felt unsteady when walking to the bathroom this morning.   Assessment  & Plan :   Hospital course Acute  gastroenteritis Suspect symptoms post chemotherapy with viral gastroenteritis. Diarrhea improving.  Resume IV fluids as patient tachycardic on standing up.  Hypokalemia/hypomagnesemia Replenished.  Dehydration with tachycardia Resume IV fluids.  Check orthostasis.  PT evaluation  COPD Continue O2 as needed.  Wheezing on admission currently resolved.  Continue duo nebs.  UTI Recent ESBL E. coli UTI which was treated with nitrofurantoin.  Reported some dysuria on presentation.  Urine culture growing ESBL again.  Continue meropenem  and will transition to nitrofurantoin upon discharge.  Pancytopenia Secondary to chemotherapy.  Low but stable.  Small cell lung cancer On systemic chemotherapy and radiation and follows with Dr. Julien Nordmann.  Was scheduled for CT of the chest, abdomen pelvis with contrast tomorrow.  She had a CT of the abdomen pelvis in the ED and will obtain CT of the chest while in the hospital (was scheduled for outpatient today).  Essential hypertension Home metoprolol was held due to hypotension on presentation, will resume.  Hold amlodipine for now.        Code Status : Full code  Family Communication  : None at bedside  Disposition Plan  : Possibly in a.m. if symptoms resolved.   Barriers For Discharge : Improving symptoms  Consults  : None  Procedures  : CT abdomen pelvis with contrast  DVT Prophylaxis  : SCDs  Lab Results  Component Value Date   PLT 72 (L) 01/08/2019    Antibiotics  :    Anti-infectives (From admission, onward)   Start     Dose/Rate Route Frequency Ordered Stop   01/06/19 1700  meropenem (MERREM) 1 g in sodium chloride 0.9 % 100 mL IVPB     1 g 200 mL/hr over 30 Minutes Intravenous Every 8 hours 01/06/19 1653     01/06/19 1545  metroNIDAZOLE (FLAGYL) IVPB 500 mg  Status:  Discontinued     500 mg 100 mL/hr over 60 Minutes Intravenous  Once 01/06/19 1540 01/06/19 1738   01/06/19 1530  ciprofloxacin (CIPRO) IVPB 400 mg  Status:   Discontinued     400 mg 200 mL/hr over 60 Minutes Intravenous  Once 01/06/19 1528 01/06/19 1641        Objective:   Vitals:   01/08/19 0130 01/08/19 0500 01/08/19 0605 01/08/19 0811  BP:   (!) 127/54   Pulse:   74   Resp:   20   Temp:   98.1 F (36.7 C)   TempSrc:   Oral   SpO2: 93%  100% 99%  Weight:  75.7 kg    Height:        Wt Readings from Last 3 Encounters:  01/08/19 75.7 kg  12/29/18 73.3 kg  12/21/18 73.9 kg     Intake/Output Summary (Last 24 hours) at 01/08/2019 1053 Last data filed at 01/08/2019 0900 Gross per 24 hour  Intake 1557.93 ml  Output 2250 ml  Net -692.07 ml    Physical exam Fatigued HEENT: Pallor present, moist mucosa, supple neck Chest: Clear bilaterally, right-sided Port-A-Cath CVs: S1 and S2 tachycardic, no murmurs GI: Soft, nondistended, nontender Musculoskeletal: Warm, no edema      Data Review:    CBC Recent Labs  Lab 01/06/19 1116 01/07/19 0531 01/08/19 0523  WBC 3.6* 2.8* 2.7*  HGB 7.9* 7.0* 7.8*  HCT 24.5* 21.4* 24.1*  PLT 82* 67* 72*  MCV 97.2 98.2 98.0  MCH 31.3 32.1 31.7  MCHC 32.2 32.7 32.4  RDW 18.6* 19.1* 19.5*  LYMPHSABS 1.0  --   --   MONOABS 0.4  --   --   EOSABS 0.1  --   --   BASOSABS 0.0  --   --     Chemistries  Recent Labs  Lab 01/06/19 1116 01/07/19 0531 01/08/19 0523  NA 138 141 141  K 3.2* 3.3* 3.6  CL 108 114* 110  CO2 23 23 24   GLUCOSE 95 86 84  BUN 9 5* <5*  CREATININE 0.67 0.51 0.47  CALCIUM  7.1* 6.8* 7.5*  MG 1.1* 1.2* 1.5*  AST 12*  --   --   ALT 8  --   --   ALKPHOS 35*  --   --   BILITOT 0.7  --   --    ------------------------------------------------------------------------------------------------------------------ No results for input(s): CHOL, HDL, LDLCALC, TRIG, CHOLHDL, LDLDIRECT in the last 72 hours.  No results found for: HGBA1C ------------------------------------------------------------------------------------------------------------------ No results for input(s):  TSH, T4TOTAL, T3FREE, THYROIDAB in the last 72 hours.  Invalid input(s): FREET3 ------------------------------------------------------------------------------------------------------------------ No results for input(s): VITAMINB12, FOLATE, FERRITIN, TIBC, IRON, RETICCTPCT in the last 72 hours.  Coagulation profile No results for input(s): INR, PROTIME in the last 168 hours.  No results for input(s): DDIMER in the last 72 hours.  Cardiac Enzymes Recent Labs  Lab 01/06/19 1116  TROPONINI <0.03   ------------------------------------------------------------------------------------------------------------------    Component Value Date/Time   BNP 91.4 03/21/2017 0719   BNP 13.1 11/11/2015 1045    Inpatient Medications  Scheduled Meds: . FLUoxetine  40 mg Oral Daily  . ipratropium-albuterol  3 mL Nebulization Q6H  . levothyroxine  50 mcg Oral QAC breakfast  . pantoprazole  40 mg Oral BID AC  . potassium chloride  40 mEq Oral Once  . sodium chloride flush  3 mL Intravenous Q12H   Continuous Infusions: . 0.9 % NaCl with KCl 20 mEq / L 100 mL/hr at 01/08/19 0854  . meropenem (MERREM) IV 1 g (01/08/19 0856)   PRN Meds:.acetaminophen **OR** acetaminophen, albuterol, ALPRAZolam, HYDROcodone-acetaminophen, iohexol, ondansetron **OR** ondansetron (ZOFRAN) IV  Micro Results Recent Results (from the past 240 hour(s))  Urine culture     Status: Abnormal   Collection Time: 01/06/19  2:30 PM  Result Value Ref Range Status   Specimen Description   Final    URINE, CLEAN CATCH Performed at [facility], [address], [place], [place] [address]    Special Requests   Final    NONE Performed at [facility], [address], [place], [place] [address]    Culture (A)  Final    >=100,000 COLONIES/mL ESCHERICHIA COLI Confirmed Extended Spectrum Beta-Lactamase Producer (ESBL).  In bloodstream infections from ESBL organisms, carbapenems are preferred over piperacillin/tazobactam. They are  shown to have a lower risk of mortality.    Report Status 01/08/2019 FINAL  Final   Organism ID, Bacteria ESCHERICHIA COLI (A)  Final      Susceptibility   Escherichia coli - MIC*    AMPICILLIN >=32 RESISTANT Resistant     CEFAZOLIN >=64 RESISTANT Resistant     CEFTRIAXONE >=64 RESISTANT Resistant     CIPROFLOXACIN >=4 RESISTANT Resistant     GENTAMICIN <=1 SENSITIVE Sensitive     IMIPENEM <=0.25 SENSITIVE Sensitive     NITROFURANTOIN <=16 SENSITIVE Sensitive     TRIMETH/SULFA >=320 RESISTANT Resistant     AMPICILLIN/SULBACTAM >=32 RESISTANT Resistant     PIP/TAZO <=4 SENSITIVE Sensitive     Extended ESBL POSITIVE Resistant     * >=100,000 COLONIES/mL ESCHERICHIA COLI    Radiology Reports Dg Chest 2 View  Result Date: 01/06/2019 CLINICAL DATA:  Abdominal pain with nausea, vomiting, and diarrhea. Currently undergoing treatment for lung cancer. EXAM: CHEST - 2 VIEW COMPARISON:  Chest x-ray dated 07/07/2017 and CT scan of the chest dated 10/27/2018 FINDINGS: Power port in place with the tip in the superior vena cava just below the carina in good position. The heart size and pulmonary vascularity are normal. The patchy infiltrates in the right lung seen on the  prior chest CT have resolved. There is scarring in the medial aspect of the left upper lobe. On the lateral view there is fullness in the aortopulmonary window consistent with the adenopathy noted on the recent CT scan. No effusions. There is also slight increased density medially at the left lung base felt to be secondary to prior radiation therapy, also as demonstrated on the prior CT scan. IMPRESSION: No acute abnormality in the chest. Electronically Signed   By: Lorriane Shire M.D.   On: 01/06/2019 13:05   Ct Abdomen Pelvis W Contrast  Result Date: 01/06/2019 CLINICAL DATA:  Nausea, vomiting and diarrhea with abdominal pain for the past 3 days. History of metastatic lung cancer with her last chemotherapy treatment on 12/29/2018. EXAM:  CT ABDOMEN AND PELVIS WITH CONTRAST TECHNIQUE: Multidetector CT imaging of the abdomen and pelvis was performed using the standard protocol following bolus administration of intravenous contrast. CONTRAST:  158mL OMNIPAQUE IOHEXOL 300 MG/ML  SOLN COMPARISON:  11/01/2018.  PET-CT dated 02/09/2018. FINDINGS: Lower chest: Mildly progressive atelectasis in the medial aspect of the left lower lobe, partially surrounding the descending thoracic aorta. Interval decrease in the small amount of adjacent pleural fluid/thickening. Mild dependent atelectasis in the right lower lobe. Hepatobiliary: Stable mild diffuse low density of the liver and cholecystectomy clips. Mild post cholecystectomy intrahepatic ductal dilatation with mild improvement. The previously demonstrated oval area of low density in the posterior aspect of the lateral segment of the left lobe of the liver appears smaller and less prominent. This previously measured 2.5 cm in maximum diameter and currently measures 1.0 cm in maximum diameter on image number 27 series 2. Pancreas: Unremarkable. No pancreatic ductal dilatation or surrounding inflammatory changes. Spleen: Normal in size without focal abnormality. Adrenals/Urinary Tract: Normal appearing adrenal glands. Stable small upper pole right renal cyst. Interval mild right renal collecting system and ureter mucosal thickening and enhancement. Interval minimal similar changes on the left. Unremarkable urinary bladder. No urinary tract calculi or hydronephrosis seen. Stomach/Bowel: Moderately large hiatal hernia without significant change. Multiple colonic diverticula without evidence of diverticulitis. No evidence of appendicitis. Mild mucosal enhancement in the right colon, cecum and proximal transverse colon without significant thickening. Previously noted duodenal periampullary diverticulum. Vascular/Lymphatic: Atheromatous arterial calcifications without aneurysm. No enlarged lymph nodes. Reproductive:  Status post hysterectomy. No adnexal masses. Other: No abdominal wall hernia or abnormality. No abdominopelvic ascites. Musculoskeletal: The previously demonstrated 2 cm oval sclerotic lesion in the L3 vertebral body is unchanged. Lumbar and lower thoracic spine degenerative changes. IMPRESSION: 1. Mild mucosal enhancement in the right colon, cecum and proximal transverse colon without significant thickening. This could be due to mild colitis. 2. Interval mild right renal collecting system and ureter mucosal thickening and enhancement. This can be seen with urinary tract infection. Interval minimal similar changes on the left. 3. Interval decrease in size of the previously demonstrated left lobe liver metastasis. 4. Stable moderate-sized hiatal hernia. 5. Stable L3 vertebral body sclerotic lesion compatible with a treated previously demonstrated metastasis. Electronically Signed   By: Claudie Revering M.D.   On: 01/06/2019 15:36    Time Spent in minutes  25   Danali Marinos M.D on 01/08/2019 at 10:53 AM  Between 7am to 7pm - Pager - [phone]  After 7pm go to [email or web address] - password [facility]  [facility] -  Office  [phone]

## 2019-01-08 NOTE — Plan of Care (Signed)
  Problem: Acute Rehab PT Goals(only PT should resolve) Goal: Pt Will Go Supine/Side To Sit Outcome: Progressing Flowsheets (Taken 01/08/2019 1232) Pt will go Supine/Side to Sit: Independently Goal: Patient Will Transfer Sit To/From Stand Outcome: Progressing Flowsheets (Taken 01/08/2019 1232) Patient will transfer sit to/from stand: with modified independence Goal: Pt Will Transfer Bed To Chair/Chair To Bed Outcome: Progressing Flowsheets (Taken 01/08/2019 1232) Pt will Transfer Bed to Chair/Chair to Bed: with modified independence Goal: Pt Will Ambulate Outcome: Progressing Flowsheets (Taken 01/08/2019 1232) Pt will Ambulate: 50 feet; with modified independence   12:33 PM, 01/08/19 Lonell Grandchild, MPT Physical Therapist with [facility] [phone] office [phone] mobile phone

## 2019-01-08 NOTE — Care Management Note (Signed)
Case Management Note  Patient Details  Name: Rebekah Henderson MRN: [id number] Date of Birth: 1956/11/04  Subjective/Objective:          Gastroenteritis. Currently on chemo. Lives alone, has family support. No assistive devices.  Has continuo     Action/Plan: Recommended for home health PT. Patient is not interested. She will have family at home with her after DC. She is aware PCP can order. Interested in [place] today.   Expected Discharge Date:     01/08/19             Expected Discharge Plan:  Home/Self Care  In-House Referral:     Discharge planning Services  CM Consult  Post Acute Care Choice:  Home Health Choice offered to:  Patient  DME Arranged:    DME Agency:     HH Arranged:  Patient Refused [place] Agency:     Status of Service:  Completed, signed off  If discussed at [employer] of Stay Meetings, dates discussed:    Additional Comments:  Reggie Bise, Chauncey Reading, RN 01/08/2019, 1:14 PM

## 2019-01-11 ENCOUNTER — Telehealth: Payer: Self-pay | Admitting: *Deleted

## 2019-01-11 ENCOUNTER — Inpatient Hospital Stay: Payer: Medicare Other

## 2019-01-11 ENCOUNTER — Inpatient Hospital Stay: Payer: Medicare Other | Admitting: Internal Medicine

## 2019-01-11 NOTE — Telephone Encounter (Signed)
Per MD, called pt advised she will need to be seen this week. Spoke with pt who advised she can come in tomorrow at 10am if she can find a driver. Scheduling will add pt to MDs schedule. Pt will call back if she is unable to get transportation.

## 2019-01-12 ENCOUNTER — Inpatient Hospital Stay: Payer: Medicare Other | Attending: Internal Medicine | Admitting: Internal Medicine

## 2019-01-12 ENCOUNTER — Inpatient Hospital Stay: Payer: Medicare Other

## 2019-01-12 ENCOUNTER — Encounter: Payer: Self-pay | Admitting: Internal Medicine

## 2019-01-12 ENCOUNTER — Other Ambulatory Visit: Payer: Self-pay | Admitting: Medical Oncology

## 2019-01-12 DIAGNOSIS — E039 Hypothyroidism, unspecified: Secondary | ICD-10-CM

## 2019-01-12 DIAGNOSIS — Z923 Personal history of irradiation: Secondary | ICD-10-CM | POA: Insufficient documentation

## 2019-01-12 DIAGNOSIS — C787 Secondary malignant neoplasm of liver and intrahepatic bile duct: Secondary | ICD-10-CM

## 2019-01-12 DIAGNOSIS — D696 Thrombocytopenia, unspecified: Secondary | ICD-10-CM

## 2019-01-12 DIAGNOSIS — E860 Dehydration: Secondary | ICD-10-CM

## 2019-01-12 DIAGNOSIS — D649 Anemia, unspecified: Secondary | ICD-10-CM

## 2019-01-12 DIAGNOSIS — Z9221 Personal history of antineoplastic chemotherapy: Secondary | ICD-10-CM | POA: Diagnosis not present

## 2019-01-12 DIAGNOSIS — D6481 Anemia due to antineoplastic chemotherapy: Secondary | ICD-10-CM | POA: Diagnosis not present

## 2019-01-12 DIAGNOSIS — I10 Essential (primary) hypertension: Secondary | ICD-10-CM

## 2019-01-12 DIAGNOSIS — C3412 Malignant neoplasm of upper lobe, left bronchus or lung: Secondary | ICD-10-CM | POA: Diagnosis not present

## 2019-01-12 DIAGNOSIS — C7951 Secondary malignant neoplasm of bone: Secondary | ICD-10-CM | POA: Diagnosis not present

## 2019-01-12 DIAGNOSIS — C3492 Malignant neoplasm of unspecified part of left bronchus or lung: Secondary | ICD-10-CM

## 2019-01-12 DIAGNOSIS — R112 Nausea with vomiting, unspecified: Secondary | ICD-10-CM

## 2019-01-12 DIAGNOSIS — T451X5A Adverse effect of antineoplastic and immunosuppressive drugs, initial encounter: Secondary | ICD-10-CM

## 2019-01-12 LAB — CMP (CANCER CENTER ONLY)
ALT: 6 U/L (ref 0–44)
AST: 9 U/L — ABNORMAL LOW (ref 15–41)
Albumin: 3.5 g/dL (ref 3.5–5.0)
Alkaline Phosphatase: 53 U/L (ref 38–126)
Anion gap: 10 (ref 5–15)
BUN: 7 mg/dL — AB (ref 8–23)
CO2: 28 mmol/L (ref 22–32)
Calcium: 8 mg/dL — ABNORMAL LOW (ref 8.9–10.3)
Chloride: 106 mmol/L (ref 98–111)
Creatinine: 0.77 mg/dL (ref 0.44–1.00)
GFR, Est AFR Am: >60 mL/min (ref >60)
GFR, Estimated: >60 mL/min (ref >60)
GLUCOSE: 95 mg/dL (ref 70–99)
Potassium: 3.9 mmol/L (ref 3.5–5.1)
Sodium: 144 mmol/L (ref 135–145)
Total Bilirubin: 0.6 mg/dL (ref 0.3–1.2)
Total Protein: 6.3 g/dL — ABNORMAL LOW (ref 6.5–8.1)

## 2019-01-12 LAB — CBC WITH DIFFERENTIAL (CANCER CENTER ONLY)
Abs Immature Granulocytes: 0.03 10*3/uL (ref 0.00–0.07)
Basophils Absolute: 0 10*3/uL (ref 0.0–0.1)
Basophils Relative: 0 %
EOS PCT: 12 %
Eosinophils Absolute: 0.5 10*3/uL (ref 0.0–0.5)
HCT: 25.3 % — ABNORMAL LOW (ref 36.0–46.0)
Hemoglobin: 8.2 g/dL — ABNORMAL LOW (ref 12.0–15.0)
Immature Granulocytes: 1 %
Lymphocytes Relative: 20 %
Lymphs Abs: 0.8 10*3/uL (ref 0.7–4.0)
MCH: 32.3 pg (ref 26.0–34.0)
MCHC: 32.4 g/dL (ref 30.0–36.0)
MCV: 99.6 fL (ref 80.0–100.0)
Monocytes Absolute: 0.5 10*3/uL (ref 0.1–1.0)
Monocytes Relative: 12 %
Neutro Abs: 2.2 10*3/uL (ref 1.7–7.7)
Neutrophils Relative %: 55 %
Platelet Count: 80 10*3/uL — ABNORMAL LOW (ref 150–400)
RBC: 2.54 MIL/uL — ABNORMAL LOW (ref 3.87–5.11)
RDW: 19.9 % — ABNORMAL HIGH (ref 11.5–15.5)
WBC Count: 3.9 10*3/uL — ABNORMAL LOW (ref 4.0–10.5)
nRBC: 0 % (ref 0.0–0.2)

## 2019-01-12 LAB — PREPARE RBC (CROSSMATCH)

## 2019-01-12 LAB — MAGNESIUM: Magnesium: 0.9 mg/dL — CL (ref 1.7–2.4)

## 2019-01-12 LAB — SAMPLE TO BLOOD BANK

## 2019-01-12 MED ORDER — SODIUM CHLORIDE 0.9% FLUSH
10.0000 mL | INTRAVENOUS | Status: AC | PRN
  Administered 2019-01-12: 10 mL
  Filled 2019-01-12: qty 10

## 2019-01-12 MED ORDER — SODIUM CHLORIDE 0.9% IV SOLUTION
250.0000 mL | Freq: Once | INTRAVENOUS | Status: AC
  Administered 2019-01-12: 250 mL via INTRAVENOUS
  Filled 2019-01-12: qty 250

## 2019-01-12 MED ORDER — ACETAMINOPHEN 325 MG PO TABS
ORAL_TABLET | ORAL | Status: AC
  Filled 2019-01-12: qty 2

## 2019-01-12 MED ORDER — DIPHENHYDRAMINE HCL 25 MG PO CAPS
ORAL_CAPSULE | ORAL | Status: AC
  Filled 2019-01-12: qty 1

## 2019-01-12 MED ORDER — HEPARIN SOD (PORK) LOCK FLUSH 100 UNIT/ML IV SOLN
500.0000 [IU] | Freq: Every day | INTRAVENOUS | Status: AC | PRN
  Administered 2019-01-12: 500 [IU]
  Filled 2019-01-12: qty 5

## 2019-01-12 MED ORDER — DIPHENHYDRAMINE HCL 25 MG PO CAPS
25.0000 mg | ORAL_CAPSULE | Freq: Once | ORAL | Status: AC
  Administered 2019-01-12: 25 mg via ORAL

## 2019-01-12 MED ORDER — MAGNESIUM SULFATE 4 GM/100ML IV SOLN: 4.0000 g | Freq: Once | INTRAVENOUS | Status: DC

## 2019-01-12 MED ORDER — SODIUM CHLORIDE 0.9 % IV SOLN
Freq: Once | INTRAVENOUS | Status: DC
  Administered 2019-01-12: 12:00:00 via INTRAVENOUS
  Filled 2019-01-12: qty 100

## 2019-01-12 MED ORDER — ACETAMINOPHEN 325 MG PO TABS
650.0000 mg | ORAL_TABLET | Freq: Once | ORAL | Status: AC
  Administered 2019-01-12: 650 mg via ORAL

## 2019-01-12 NOTE — Patient Instructions (Signed)
Blood Transfusion, Adult, Care After This sheet gives you information about how to care for yourself after your procedure. Your doctor may also give you more specific instructions. If you have problems or questions, contact your doctor. Follow these instructions at home:   Take over-the-counter and prescription medicines only as told by your doctor.  Go back to your normal activities as told by your doctor.  Follow instructions from your doctor about how to take care of the area where an IV tube was put into your vein (insertion site). Make sure you: ? Wash your hands with soap and water before you change your bandage (dressing). If there is no soap and water, use hand sanitizer. ? Change your bandage as told by your doctor.  Check your IV insertion site every day for signs of infection. Check for: ? More redness, swelling, or pain. ? More fluid or blood. ? Warmth. ? Pus or a bad smell. Contact a doctor if:  You have more redness, swelling, or pain around the IV insertion site.  You have more fluid or blood coming from the IV insertion site.  Your IV insertion site feels warm to the touch.  You have pus or a bad smell coming from the IV insertion site.  Your pee (urine) turns pink, red, or brown.  You feel weak after doing your normal activities. Get help right away if:  You have signs of a serious allergic or body defense (immune) system reaction, including: ? Itchiness. ? Hives. ? Trouble breathing. ? Anxiety. ? Pain in your chest or lower back. ? Fever, flushing, and chills. ? Fast pulse. ? Rash. ? Watery poop (diarrhea). ? Throwing up (vomiting). ? Dark pee. ? Serious headache. ? Dizziness. ? Stiff neck. ? Yellow color in your face or the white parts of your eyes (jaundice). Summary  After a blood transfusion, return to your normal activities as told by your doctor.  Every day, check for signs of infection where the IV tube was put into your vein.  Some  signs of infection are warm skin, more redness and pain, more fluid or blood, and pus or a bad smell where the needle went in.  Contact your doctor if you feel weak or have any unusual symptoms. This information is not intended to replace advice given to you by your health care provider. Make sure you discuss any questions you have with your health care provider. Document Released: 12/09/2014 Document Revised: 07/12/2016 Document Reviewed: 07/12/2016 Elsevier Interactive Patient Education  2019 [employer]  

## 2019-01-12 NOTE — Progress Notes (Signed)
Rebekah Henderson [phone]   [phone]  OFFICE PROGRESS NOTE  Redmond School, MD [address] [place] [address]  DIAGNOSIS: Metastatic small cell lung cancer initially diagnosed as Limited stage (T1b, N2, M0) small cell lung cancer presented with left lower lobe/infrahilar mass and large mediastinal lymphadenopathy diagnosed in October 2017.  PRIOR THERAPY: 1) Systemic chemotherapy with cisplatin 60 MG/M2 on day 1 and etoposide 120 MG/M2 on days 1, 2 and 3 status post 1 cycle. This was discontinued secondary to intolerance. 2) Systemic chemotherapy with carboplatin for AUC of 4 on day 1 and etoposide 100 MG/M2 on days 1, 2 and 3 with Neulasta support on day 4 every 3 weeks. Status post 5 cycles. This was concurrent with radiation in [place], [place]. 3) prophylactic cranial irradiation. 4) Systemic chemotherapy with carboplatin for AUC of 5 on day 1, etoposide 100 mg/M2 on days 1, 2 and 3 as well as a Tecentriq (Atezolizumab) 1200 mg IV every 3 weeks with Neulasta support.  First dose February 17, 2018.  Status post 6 cycles of partial response. 5) Maintenance treatment with immunotherapy with Tecentriq 1200 mg IV every 3 weeks.  First dose June 30, 2018.  Status post 6 cycles.  Last dose was given October 13, 2018 discontinued secondary to disease progression.   CURRENT THERAPY: Systemic chemotherapy with cisplatin 30 mg/M2 and irinotecan 65 mg/M2 on days 1 and 8 every 3 weeks.  First dose November 10, 2018.  Starting cycle #2 her dose of cisplatin was reduced to 25 mg/M2 and irinotecan 50 mg/M2 on days 1 and 8 every 3 weeks.  Status post 3 cycles.   INTERVAL HISTORY: Rebekah Henderson 63 y.o. female returns to the clinic today for follow-up visit accompanied by friend.  The patient is feeling fine today with no concerning complaints except for fatigue and weight loss.  She was recently admitted to [facility] with viral gastroenteritis  as well as urinary tract infection.  She received IV hydration as well as antibiotics and she felt much better.  She denied having any current chest pain, shortness of breath, cough or hemoptysis.  She denied having any fever or chills.  She has no nausea, vomiting, diarrhea or constipation.  She had repeat CT scan of the chest, abdomen and pelvis performed recently and she is here for evaluation and discussion of her scan results and treatment options.  MEDICAL HISTORY: Past Medical History:  Diagnosis Date  . 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 Anxiety    takes Prozac daily  . Arthritis   . Benign fundic gland polyps of stomach   . Colon polyps   . COPD (chronic obstructive pulmonary disease) ([place])   . Dehydration 03/06/2017  . Diverticulitis   . Dyspnea    with exertion  . Encounter for antineoplastic chemotherapy 12/03/2016  . Fibromyalgia   . GERD (gastroesophageal reflux disease)    takes Pantoprazole daily  . Hypertension    takes Metoprolol,Triamterene-HCTZ,and Amlodipine daily  . Hypothyroidism    takes Synthroid daily  . IBS (irritable bowel syndrome)   . lung ca dx'd 10/02/2016   skin, lung  . PONV (postoperative nausea and vomiting)    pt also states that she had some difficulty breathing after cervical fusion    ALLERGIES:  is allergic to penicillins; codeine; fentanyl; ranitidine hcl; keflex [cephalexin]; and lyrica [pregabalin].  MEDICATIONS:  Current Outpatient Medications  Medication Sig Dispense Refill  . albuterol (PROVENTIL  HFA;VENTOLIN HFA) 108 (90 Base) MCG/ACT inhaler Inhale 2 puffs into the lungs every 4 (four) hours as needed for wheezing or shortness of breath.    . ALPRAZolam (XANAX) 0.25 MG tablet Take 2 tablets (0.5 mg total) by mouth at bedtime as needed for anxiety. 30 tablet 0  . amLODipine (NORVASC) 5 MG tablet Take 5 mg by mouth daily.      . cyclobenzaprine (FLEXERIL) 10 MG tablet Take 10 mg by mouth once as needed for muscle  spasms.    Marland Kitchen dicyclomine (BENTYL) 20 MG tablet Take 20 mg by mouth 3 (three) times daily as needed for spasms.    . diphenoxylate-atropine (LOMOTIL) 2.5-0.025 MG tablet Take 1 tablet by mouth 4 (four) times daily as needed for diarrhea or loose stools (take if immodium does not work). 30 tablet 0  . estazolam (PROSOM) 2 MG tablet Take 2 mg by mouth at bedtime.    Marland Kitchen estradiol (ESTRACE) 2 MG tablet Take 2 mg by mouth daily.      Marland Kitchen FLUoxetine (PROZAC) 40 MG capsule Take 40 mg by mouth daily.   2  . HYDROcodone-acetaminophen (NORCO/VICODIN) 5-325 MG tablet Take 1 tablet by mouth every 4 (four) hours as needed for moderate pain.   0  . levothyroxine (SYNTHROID, LEVOTHROID) 50 MCG tablet Take 50 mcg by mouth daily before breakfast.     . lidocaine-prilocaine (EMLA) cream Apply 1 application topically as needed. To numb skin over port a cath: Squeeze a  small amount on cotton ball and place over port site 1-2 hours prior to chemotherapy. 30 g 0  . loperamide (IMODIUM) 2 MG capsule Take 2 mg by mouth every 2 (two) hours as needed for diarrhea or loose stools.    . magnesium oxide (MAG-OX) 400 (241.3 Mg) MG tablet Take 1 tablet (400 mg total) by mouth 2 (two) times daily. 60 tablet 1  . metoprolol succinate (TOPROL-XL) 25 MG 24 hr tablet Take 25 mg by mouth daily.    . nitrofurantoin, macrocrystal-monohydrate, (MACROBID) 100 MG capsule Take 1 capsule (100 mg total) by mouth 2 (two) times daily. 10 capsule 0  . ondansetron (ZOFRAN-ODT) 4 MG disintegrating tablet Dissolve one tablet by mouth every 8 hours as needed for nausea and vomiting, 20 tablet 1  . pantoprazole (PROTONIX) 40 MG tablet Take 40 mg by mouth 2 (two) times daily before a meal.   10  . potassium chloride SA (K-DUR,KLOR-CON) 20 MEQ tablet Take 20 mEq by mouth daily as needed (for supplement).    . promethazine (PHENERGAN) 25 MG tablet TAKE ONE TABLET BY MOUTH EVERY 6 HOURS AS NEEDED. (Patient taking differently: Take 25 mg by mouth every 6 (six)  hours as needed for nausea or vomiting. ) 30 tablet 0  . senna-docusate (SENOKOT-S) 8.6-50 MG tablet Take 1 tablet by mouth daily as needed for mild constipation or moderate constipation.     . tizanidine (ZANAFLEX) 2 MG capsule Take 1 capsule (2 mg total) by mouth 3 (three) times daily as needed for muscle spasms. 30 capsule 0   No current facility-administered medications for this visit.     SURGICAL HISTORY:  Past Surgical History:  Procedure Laterality Date  . BIOPSY N/A 05/25/2013   Procedure: BIOPSIES (Random Colon; Duodenal; Gastric);  Surgeon: Danie Binder, MD;  Location: [facility];  Service: Endoscopy;  Laterality: N/A;  . BLADDER SUSPENSION    . BREAST ENHANCEMENT SURGERY    . BREAST IMPLANT REMOVAL    . CERVICAL FUSION  AUG 2013  . CHOLECYSTECTOMY  1999  . COLONOSCOPY  2007 [place]   POLYPS  . COLONOSCOPY WITH PROPOFOL N/A 05/25/2013   Procedure: COLONOSCOPY WITH PROPOFOL(at cecum 0957) total withdrawal time=63min);  Surgeon: Danie Binder, MD;  Location: [facility];  Service: Endoscopy;  Laterality: N/A;  . ESOPHAGOGASTRODUODENOSCOPY (EGD) WITH PROPOFOL N/A 05/25/2013   Procedure: ESOPHAGOGASTRODUODENOSCOPY (EGD) WITH PROPOFOL;  Surgeon: Danie Binder, MD;  Location: [facility];  Service: Endoscopy;  Laterality: N/A;  . FOOT SURGERY    . IR FLUORO GUIDE PORT INSERTION RIGHT  03/04/2018  . IR US GUIDE VASC ACCESS RIGHT  03/04/2018  . POLYPECTOMY N/A 05/25/2013   Procedure: POLYPECTOMY (Rectal and Gastric);  Surgeon: Danie Binder, MD;  Location: [facility];  Service: Endoscopy;  Laterality: N/A;  . TONSILLECTOMY    . UPPER GASTROINTESTINAL ENDOSCOPY    . VIDEO BRONCHOSCOPY WITH ENDOBRONCHIAL ULTRASOUND  09/12/2016   Procedure: VIDEO BRONCHOSCOPY WITH ENDOBRONCHIAL ULTRASOUND AND BIOPSY;  Surgeon: Juanito Doom, MD;  Location: [facility];  Service: Cardiopulmonary;;    REVIEW OF SYSTEMS:  Constitutional: positive for fatigue and weight loss Eyes: negative Ears, nose, mouth, throat, and  face: negative Respiratory: negative Cardiovascular: negative Gastrointestinal: positive for diarrhea Genitourinary:negative Integument/breast: negative Hematologic/lymphatic: negative Musculoskeletal:negative Neurological: negative Behavioral/Psych: negative Endocrine: negative Allergic/Immunologic: negative   PHYSICAL EXAMINATION: General appearance: alert, cooperative, fatigued and no distress Head: Normocephalic, without obvious abnormality, atraumatic Neck: no adenopathy, no JVD, supple, symmetrical, trachea midline and thyroid not enlarged, symmetric, no tenderness/mass/nodules Lymph nodes: Cervical, supraclavicular, and axillary nodes normal. Resp: clear to auscultation bilaterally Back: symmetric, no curvature. ROM normal. No CVA tenderness. Cardio: regular rate and rhythm, S1, S2 normal, no murmur, click, rub or gallop GI: soft, non-tender; bowel sounds normal; no masses,  no organomegaly Extremities: extremities normal, atraumatic, no cyanosis or edema Neurologic: Alert and oriented X 3, normal strength and tone. Normal symmetric reflexes. Normal coordination and gait   ECOG PERFORMANCE STATUS: 1 - Symptomatic but completely ambulatory  Blood pressure 122/71, pulse 78, temperature 97.9 F (36.6 C), temperature source Oral, resp. rate 18, height 5\' 4"  (1.626 m), weight 158 lb 11.2 oz (72 kg), SpO2 91 %.  LABORATORY DATA: Lab Results  Component Value Date   WBC 3.9 (L) 01/12/2019   HGB 8.2 (L) 01/12/2019   HCT 25.3 (L) 01/12/2019   MCV 99.6 01/12/2019   PLT 80 (L) 01/12/2019      Chemistry      Component Value Date/Time   NA 141 01/08/2019 0523   NA 141 09/08/2017 1009   K 3.6 01/08/2019 0523   K 3.5 09/08/2017 1009   CL 110 01/08/2019 0523   CO2 24 01/08/2019 0523   CO2 29 09/08/2017 1009   BUN <5 (L) 01/08/2019 0523   BUN 5.8 (L) 09/08/2017 1009   CREATININE 0.47 01/08/2019 0523   CREATININE 0.71 12/29/2018 0849   CREATININE 0.7 09/08/2017 1009        Component Value Date/Time   CALCIUM 7.5 (L) 01/08/2019 0523   CALCIUM 9.0 09/08/2017 1009   ALKPHOS 35 (L) 01/06/2019 1116   ALKPHOS 49 09/08/2017 1009   AST 12 (L) 01/06/2019 1116   AST 8 (L) 12/29/2018 0849   AST 9 09/08/2017 1009   ALT 8 01/06/2019 1116   ALT 8 12/29/2018 0849   ALT <6 09/08/2017 1009   BILITOT 0.7 01/06/2019 1116   BILITOT 0.5 12/29/2018 0849   BILITOT 0.51 09/08/2017 1009       RADIOGRAPHIC STUDIES:  Dg Chest 2 View  Result Date: 01/06/2019 CLINICAL DATA:  Abdominal pain with nausea, vomiting, and diarrhea. Currently undergoing treatment for lung cancer. EXAM: CHEST - 2 VIEW COMPARISON:  Chest x-ray dated 07/07/2017 and CT scan of the chest dated 10/27/2018 FINDINGS: Power port in place with the tip in the superior vena cava just below the carina in good position. The heart size and pulmonary vascularity are normal. The patchy infiltrates in the right lung seen on the prior chest CT have resolved. There is scarring in the medial aspect of the left upper lobe. On the lateral view there is fullness in the aortopulmonary window consistent with the adenopathy noted on the recent CT scan. No effusions. There is also slight increased density medially at the left lung base felt to be secondary to prior radiation therapy, also as demonstrated on the prior CT scan. IMPRESSION: No acute abnormality in the chest. Electronically Signed   By: Lorriane Shire M.D.   On: 01/06/2019 13:05   Ct Chest W Contrast  Result Date: 01/08/2019 CLINICAL DATA:  Small-cell lung cancer, restaging EXAM: CT CHEST WITH CONTRAST TECHNIQUE: Multidetector CT imaging of the chest was performed during intravenous contrast administration. CONTRAST:  64mL OMNIPAQUE IOHEXOL 300 MG/ML  SOLN COMPARISON:  10/27/2018 FINDINGS: Cardiovascular: No significant vascular findings. Normal heart size. No pericardial effusion. Right chest port catheter. Mediastinum/Nodes: No significant change in a soft tissue mass  centered about the AP window measuring approximately 2.9 cm with associated abnormal left hilar soft tissue. (Series 2, image 51). Thyroid gland, trachea, and esophagus demonstrate no significant findings. Lungs/Pleura: Mild centrilobular emphysema. There has been interval increase in bilateral upper lobe paramediastinal scarring and volume loss, more conspicuous on the right, in keeping with evolution of radiation change. Multifocal ground-glass and consolidative opacities of the right lung with central clearing have resolved. Bibasilar bandlike scarring or atelectasis. Trace left pleural fluid. Upper Abdomen: No acute abnormality. Please see recent separately reported CT examination of the abdomen and pelvis. Musculoskeletal: No chest wall mass or suspicious bone lesions identified. IMPRESSION: 1. No significant change in a soft tissue mass centered about the AP window measuring approximately 2.9 cm with associated abnormal left hilar soft tissue. (Series 2, image 51). 2. There has been interval increase in bilateral upper lobe paramediastinal scarring and volume loss, more conspicuous on the right, in keeping with evolution of radiation change. 3. Multifocal ground-glass and consolidative opacities of the right lung seen on prior examination have resolved. 4.  Emphysema. Electronically Signed   By: Eddie Candle M.D.   On: 01/08/2019 11:23   Ct Abdomen Pelvis W Contrast  Result Date: 01/06/2019 CLINICAL DATA:  Nausea, vomiting and diarrhea with abdominal pain for the past 3 days. History of metastatic lung cancer with her last chemotherapy treatment on 12/29/2018. EXAM: CT ABDOMEN AND PELVIS WITH CONTRAST TECHNIQUE: Multidetector CT imaging of the abdomen and pelvis was performed using the standard protocol following bolus administration of intravenous contrast. CONTRAST:  174mL OMNIPAQUE IOHEXOL 300 MG/ML  SOLN COMPARISON:  11/01/2018.  PET-CT dated 02/09/2018. FINDINGS: Lower chest: Mildly progressive  atelectasis in the medial aspect of the left lower lobe, partially surrounding the descending thoracic aorta. Interval decrease in the small amount of adjacent pleural fluid/thickening. Mild dependent atelectasis in the right lower lobe. Hepatobiliary: Stable mild diffuse low density of the liver and cholecystectomy clips. Mild post cholecystectomy intrahepatic ductal dilatation with mild improvement. The previously demonstrated oval area of low density in the posterior aspect of the lateral  segment of the left lobe of the liver appears smaller and less prominent. This previously measured 2.5 cm in maximum diameter and currently measures 1.0 cm in maximum diameter on image number 27 series 2. Pancreas: Unremarkable. No pancreatic ductal dilatation or surrounding inflammatory changes. Spleen: Normal in size without focal abnormality. Adrenals/Urinary Tract: Normal appearing adrenal glands. Stable small upper pole right renal cyst. Interval mild right renal collecting system and ureter mucosal thickening and enhancement. Interval minimal similar changes on the left. Unremarkable urinary bladder. No urinary tract calculi or hydronephrosis seen. Stomach/Bowel: Moderately large hiatal hernia without significant change. Multiple colonic diverticula without evidence of diverticulitis. No evidence of appendicitis. Mild mucosal enhancement in the right colon, cecum and proximal transverse colon without significant thickening. Previously noted duodenal periampullary diverticulum. Vascular/Lymphatic: Atheromatous arterial calcifications without aneurysm. No enlarged lymph nodes. Reproductive: Status post hysterectomy. No adnexal masses. Other: No abdominal wall hernia or abnormality. No abdominopelvic ascites. Musculoskeletal: The previously demonstrated 2 cm oval sclerotic lesion in the L3 vertebral body is unchanged. Lumbar and lower thoracic spine degenerative changes. IMPRESSION: 1. Mild mucosal enhancement in the right  colon, cecum and proximal transverse colon without significant thickening. This could be due to mild colitis. 2. Interval mild right renal collecting system and ureter mucosal thickening and enhancement. This can be seen with urinary tract infection. Interval minimal similar changes on the left. 3. Interval decrease in size of the previously demonstrated left lobe liver metastasis. 4. Stable moderate-sized hiatal hernia. 5. Stable L3 vertebral body sclerotic lesion compatible with a treated previously demonstrated metastasis. Electronically Signed   By: Claudie Revering M.D.   On: 01/06/2019 15:36    ASSESSMENT AND PLAN:  This is a very pleasant 63 years old white female was limited stage small cell lung cancer, status post 6 cycles of systemic chemotherapy with carboplatin and etoposide concurrent with radiation and followed by prophylactic cranial irradiation. Patient has been in observation for more than a year.  She has been doing fine but recently started complaining of increasing fatigue and weakness as well as headache. The patient had evidence for disease recurrence and she was a started on treatment with systemic chemotherapy with carboplatin, etoposide and Tecentriq.  She is status post 6 cycles.  The patient had no evidence for disease progression after the induction phase of her treatment.  She was started on maintenance treatment with Tecentriq Huey Bienenstock) status post 6 cycles.  She has been tolerating this treatment well.   She had evidence for disease progression and the patient was started on systemic chemotherapy with cisplatin and irinotecan on days 1 and 8.  She is tolerating her treatment much better after her dose was reduced to cisplatin 25 mg/M2 and irinotecan 50 mg/M2 on days 1 and 8.  She is status post 3 cycles. She has been tolerating this treatment well except for pancytopenia as well as intermittent nausea and vomiting and diarrhea. She had repeat CT scan of the chest, abdomen  and pelvis performed recently.  I personally and independently reviewed the scans and discussed the results with the patient today. Her scan showed improvement of her disease in the liver with no concerning progression in the chest. I recommended for the patient to continue her current treatment with reduced dose cisplatin and irinotecan but I will delay the start of cycle number 4 by 3 weeks until improvement of her general condition. For the chemotherapy-induced anemia, I will arrange for the patient to receive 2 units of PRBCs transfusion this week.  For the hypomagnesemia, will arrange for the patient to receive 4 g of magnesium sulfate intravenously in the clinic today. The patient will come back for follow-up visit in 3 weeks for evaluation before starting cycle #4. She was advised to call immediately if she has any concerning symptoms in the interval. The patient voices understanding of current disease status and treatment options and is in agreement with the current care plan. All questions were answered. The patient knows to call the clinic with any problems, questions or concerns. We can certainly see the patient much sooner if necessary.   Disclaimer: This note was dictated with voice recognition software. Similar sounding words can inadvertently be transcribed and may not be corrected upon review.

## 2019-01-12 NOTE — Addendum Note (Signed)
Addended by: Ardeen Garland on: 01/12/2019 11:58 AM   Modules accepted: Orders

## 2019-01-13 ENCOUNTER — Telehealth: Payer: Self-pay | Admitting: Internal Medicine

## 2019-01-13 LAB — BPAM RBC
Blood Product Expiration Date: 202003092359
Blood Product Expiration Date: 202003092359
ISSUE DATE / TIME: 202002111302
ISSUE DATE / TIME: 202002111302
Unit Type and Rh: 5100
Unit Type and Rh: 5100

## 2019-01-13 LAB — TYPE AND SCREEN
ABO/RH(D): O POS
Antibody Screen: NEGATIVE
Unit division: 0
Unit division: 0

## 2019-01-13 NOTE — Telephone Encounter (Signed)
Scheduled appt per 2/11 los - pt is aware of apt dates and time

## 2019-01-15 DIAGNOSIS — M549 Dorsalgia, unspecified: Secondary | ICD-10-CM | POA: Diagnosis not present

## 2019-01-15 DIAGNOSIS — N390 Urinary tract infection, site not specified: Secondary | ICD-10-CM | POA: Diagnosis not present

## 2019-01-15 DIAGNOSIS — Z6828 Body mass index (BMI) 28.0-28.9, adult: Secondary | ICD-10-CM | POA: Diagnosis not present

## 2019-01-15 DIAGNOSIS — S0191XA Laceration without foreign body of unspecified part of head, initial encounter: Secondary | ICD-10-CM | POA: Diagnosis not present

## 2019-01-19 ENCOUNTER — Other Ambulatory Visit: Payer: Medicare Other

## 2019-01-19 ENCOUNTER — Ambulatory Visit: Payer: Medicare Other

## 2019-01-25 ENCOUNTER — Telehealth: Payer: Self-pay | Admitting: Medical Oncology

## 2019-01-25 NOTE — Telephone Encounter (Signed)
Rash all over arms and legs -started last Thursday ( macrodantin started 2/7)-she saw PCP on 2/14 for hospital f/u and rash was not bad.  "Today ,  I look like I have the measles , I have been scratching , my arms are so swollen  you can't even draw blood on me". I instructed her to see PCP today and call me back with outcome.

## 2019-01-25 NOTE — Telephone Encounter (Signed)
Rebekah Henderson said she is going to see PCP tomorrow afternoon.

## 2019-01-26 ENCOUNTER — Telehealth: Payer: Self-pay | Admitting: Medical Oncology

## 2019-01-26 DIAGNOSIS — R21 Rash and other nonspecific skin eruption: Secondary | ICD-10-CM | POA: Diagnosis not present

## 2019-01-26 DIAGNOSIS — T50905A Adverse effect of unspecified drugs, medicaments and biological substances, initial encounter: Secondary | ICD-10-CM | POA: Diagnosis not present

## 2019-01-26 DIAGNOSIS — Z6827 Body mass index (BMI) 27.0-27.9, adult: Secondary | ICD-10-CM | POA: Diagnosis not present

## 2019-01-26 DIAGNOSIS — E663 Overweight: Secondary | ICD-10-CM | POA: Diagnosis not present

## 2019-01-26 NOTE — Telephone Encounter (Signed)
Rash update- She saw her PCP today and they "don''t know what the rash is from . I got a steroid injection and topical cream" . I asked her to send me pictures. I will ask nurse to call pt tomorrow.

## 2019-01-27 ENCOUNTER — Other Ambulatory Visit: Payer: Self-pay | Admitting: Medical Oncology

## 2019-01-27 ENCOUNTER — Telehealth: Payer: Self-pay | Admitting: Medical Oncology

## 2019-01-27 DIAGNOSIS — R21 Rash and other nonspecific skin eruption: Secondary | ICD-10-CM

## 2019-01-27 MED ORDER — METHYLPREDNISOLONE 4 MG PO TBPK: ORAL_TABLET | ORAL | 0 refills | Status: DC

## 2019-01-27 NOTE — Telephone Encounter (Signed)
LVM that [facility] send a rx for her rash >medrol dose pak.

## 2019-01-28 ENCOUNTER — Ambulatory Visit (HOSPITAL_COMMUNITY): Payer: Medicare Other

## 2019-01-30 DIAGNOSIS — J449 Chronic obstructive pulmonary disease, unspecified: Secondary | ICD-10-CM | POA: Diagnosis not present

## 2019-01-30 DIAGNOSIS — R0902 Hypoxemia: Secondary | ICD-10-CM | POA: Diagnosis not present

## 2019-02-02 ENCOUNTER — Encounter: Payer: Self-pay | Admitting: Internal Medicine

## 2019-02-02 ENCOUNTER — Inpatient Hospital Stay: Payer: Medicare Other

## 2019-02-02 ENCOUNTER — Inpatient Hospital Stay: Payer: Medicare Other | Attending: Internal Medicine | Admitting: Internal Medicine

## 2019-02-02 VITALS — BP 143/75 | HR 59 | Temp 98.3°F | Resp 20 | Ht 64.0 in | Wt 157.8 lb

## 2019-02-02 DIAGNOSIS — D61818 Other pancytopenia: Secondary | ICD-10-CM | POA: Insufficient documentation

## 2019-02-02 DIAGNOSIS — R21 Rash and other nonspecific skin eruption: Secondary | ICD-10-CM | POA: Diagnosis not present

## 2019-02-02 DIAGNOSIS — E039 Hypothyroidism, unspecified: Secondary | ICD-10-CM | POA: Insufficient documentation

## 2019-02-02 DIAGNOSIS — I10 Essential (primary) hypertension: Secondary | ICD-10-CM | POA: Diagnosis not present

## 2019-02-02 DIAGNOSIS — Z5111 Encounter for antineoplastic chemotherapy: Secondary | ICD-10-CM

## 2019-02-02 DIAGNOSIS — C7951 Secondary malignant neoplasm of bone: Secondary | ICD-10-CM

## 2019-02-02 DIAGNOSIS — R110 Nausea: Secondary | ICD-10-CM | POA: Diagnosis not present

## 2019-02-02 DIAGNOSIS — Z79899 Other long term (current) drug therapy: Secondary | ICD-10-CM | POA: Diagnosis not present

## 2019-02-02 DIAGNOSIS — R197 Diarrhea, unspecified: Secondary | ICD-10-CM | POA: Diagnosis not present

## 2019-02-02 DIAGNOSIS — E860 Dehydration: Secondary | ICD-10-CM

## 2019-02-02 DIAGNOSIS — R112 Nausea with vomiting, unspecified: Secondary | ICD-10-CM

## 2019-02-02 DIAGNOSIS — C3412 Malignant neoplasm of upper lobe, left bronchus or lung: Secondary | ICD-10-CM | POA: Insufficient documentation

## 2019-02-02 DIAGNOSIS — C787 Secondary malignant neoplasm of liver and intrahepatic bile duct: Secondary | ICD-10-CM | POA: Diagnosis not present

## 2019-02-02 DIAGNOSIS — D696 Thrombocytopenia, unspecified: Secondary | ICD-10-CM | POA: Insufficient documentation

## 2019-02-02 DIAGNOSIS — D6481 Anemia due to antineoplastic chemotherapy: Secondary | ICD-10-CM

## 2019-02-02 DIAGNOSIS — C3492 Malignant neoplasm of unspecified part of left bronchus or lung: Secondary | ICD-10-CM

## 2019-02-02 DIAGNOSIS — J449 Chronic obstructive pulmonary disease, unspecified: Secondary | ICD-10-CM | POA: Diagnosis not present

## 2019-02-02 LAB — CBC WITH DIFFERENTIAL (CANCER CENTER ONLY)
Abs Immature Granulocytes: 0.1 10*3/uL — ABNORMAL HIGH (ref 0.00–0.07)
Basophils Absolute: 0 10*3/uL (ref 0.0–0.1)
Basophils Relative: 0 %
EOS PCT: 2 %
Eosinophils Absolute: 0.1 10*3/uL (ref 0.0–0.5)
HCT: 35.4 % — ABNORMAL LOW (ref 36.0–46.0)
HEMOGLOBIN: 11.6 g/dL — AB (ref 12.0–15.0)
Immature Granulocytes: 2 %
Lymphocytes Relative: 34 %
Lymphs Abs: 1.8 10*3/uL (ref 0.7–4.0)
MCH: 32.6 pg (ref 26.0–34.0)
MCHC: 32.8 g/dL (ref 30.0–36.0)
MCV: 99.4 fL (ref 80.0–100.0)
Monocytes Absolute: 0.6 10*3/uL (ref 0.1–1.0)
Monocytes Relative: 12 %
Neutro Abs: 2.7 10*3/uL (ref 1.7–7.7)
Neutrophils Relative %: 50 %
Platelet Count: 160 10*3/uL (ref 150–400)
RBC: 3.56 MIL/uL — ABNORMAL LOW (ref 3.87–5.11)
RDW: 19.8 % — ABNORMAL HIGH (ref 11.5–15.5)
WBC Count: 5.3 10*3/uL (ref 4.0–10.5)
nRBC: 0 % (ref 0.0–0.2)

## 2019-02-02 LAB — CMP (CANCER CENTER ONLY)
ALK PHOS: 68 U/L (ref 38–126)
ALT: 10 U/L (ref 0–44)
ANION GAP: 11 (ref 5–15)
AST: 12 U/L — ABNORMAL LOW (ref 15–41)
Albumin: 3.6 g/dL (ref 3.5–5.0)
BUN: 15 mg/dL (ref 8–23)
CO2: 27 mmol/L (ref 22–32)
Calcium: 8.4 mg/dL — ABNORMAL LOW (ref 8.9–10.3)
Chloride: 104 mmol/L (ref 98–111)
Creatinine: 0.7 mg/dL (ref 0.44–1.00)
GFR, Est AFR Am: >60 mL/min (ref >60)
GFR, Estimated: >60 mL/min (ref >60)
GLUCOSE: 89 mg/dL (ref 70–99)
Potassium: 3.7 mmol/L (ref 3.5–5.1)
Sodium: 142 mmol/L (ref 135–145)
Total Bilirubin: 0.5 mg/dL (ref 0.3–1.2)
Total Protein: 6.4 g/dL — ABNORMAL LOW (ref 6.5–8.1)

## 2019-02-02 LAB — MAGNESIUM: Magnesium: 1.3 mg/dL — CL (ref 1.7–2.4)

## 2019-02-02 MED ORDER — SODIUM CHLORIDE 0.9% FLUSH
10.0000 mL | INTRAVENOUS | Status: DC | PRN
  Administered 2019-02-02: 10 mL
  Filled 2019-02-02: qty 10

## 2019-02-02 MED ORDER — HEPARIN SOD (PORK) LOCK FLUSH 100 UNIT/ML IV SOLN
250.0000 [IU] | Freq: Once | INTRAVENOUS | Status: AC | PRN
  Administered 2019-02-02: 250 [IU]
  Filled 2019-02-02: qty 5

## 2019-02-02 NOTE — Progress Notes (Signed)
Add an additional 2g of Magnesium to cisplatin fluids for 3/4 for low magnesium level. Orders updated for total of 3.5 g of magnesium (28 mEq).  Demetrius Charity, PharmD, [facility] Phone: [phone] 02/02/2019

## 2019-02-02 NOTE — Addendum Note (Signed)
Addended by: Ardeen Garland on: 02/02/2019 10:13 AM   Modules accepted: Orders

## 2019-02-02 NOTE — Progress Notes (Signed)
[place] [phone]   [phone]) [phone]  OFFICE PROGRESS NOTE  Redmond School, MD [address] [place] [address]  DIAGNOSIS: Metastatic small cell lung cancer initially diagnosed as Limited stage (T1b, N2, M0) small cell lung cancer presented with left lower lobe/infrahilar mass and large mediastinal lymphadenopathy diagnosed in October 2017.  PRIOR THERAPY: 1) Systemic chemotherapy with cisplatin 60 MG/M2 on day 1 and etoposide 120 MG/M2 on days 1, 2 and 3 status post 1 cycle. This was discontinued secondary to intolerance. 2) Systemic chemotherapy with carboplatin for AUC of 4 on day 1 and etoposide 100 MG/M2 on days 1, 2 and 3 with Neulasta support on day 4 every 3 weeks. Status post 5 cycles. This was concurrent with radiation in [place], [place]. 3) prophylactic cranial irradiation. 4) Systemic chemotherapy with carboplatin for AUC of 5 on day 1, etoposide 100 mg/M2 on days 1, 2 and 3 as well as a Tecentriq (Atezolizumab) 1200 mg IV every 3 weeks with Neulasta support.  First dose February 17, 2018.  Status post 6 cycles of partial response. 5) Maintenance treatment with immunotherapy with Tecentriq 1200 mg IV every 3 weeks.  First dose June 30, 2018.  Status post 6 cycles.  Last dose was given October 13, 2018 discontinued secondary to disease progression.   CURRENT THERAPY: Systemic chemotherapy with cisplatin 30 mg/M2 and irinotecan 65 mg/M2 on days 1 and 8 every 3 weeks.  First dose November 10, 2018.  Starting cycle #2 her dose of cisplatin was reduced to 25 mg/M2 and irinotecan 50 mg/M2 on days 1 and 8 every 3 weeks.  Status post 3 cycles.   INTERVAL HISTORY: Rebekah Henderson 63 y.o. female returns to the clinic today for follow-up visit.  The patient is feeling much better today.  She had a skin rash on the left arm that significantly improved after treatment with Medrol Dosepak.  She denied having any chest pain but continues to have  right shoulder pain and she is taking Vicodin.  She denied having any shortness of breath, cough or hemoptysis.  She denied having any nausea, vomiting, diarrhea or constipation.  She has no headache or visual changes.  She is recovering and feeling much better compared to 3 weeks ago.  She is here today for evaluation before resuming her systemic chemotherapy tomorrow.   MEDICAL HISTORY: Past Medical History:  Diagnosis Date  . 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 Anxiety    takes Prozac daily  . Arthritis   . Benign fundic gland polyps of stomach   . Colon polyps   . COPD (chronic obstructive pulmonary disease) ([place])   . Dehydration 03/06/2017  . Diverticulitis   . Dyspnea    with exertion  . Encounter for antineoplastic chemotherapy 12/03/2016  . Fibromyalgia   . GERD (gastroesophageal reflux disease)    takes Pantoprazole daily  . Hypertension    takes Metoprolol,Triamterene-HCTZ,and Amlodipine daily  . Hypothyroidism    takes Synthroid daily  . IBS (irritable bowel syndrome)   . lung ca dx'd 10/02/2016   skin, lung  . PONV (postoperative nausea and vomiting)    pt also states that she had some difficulty breathing after cervical fusion    ALLERGIES:  is allergic to penicillins; codeine; fentanyl; ranitidine hcl; keflex [cephalexin]; and lyrica [pregabalin].  MEDICATIONS:  Current Outpatient Medications  Medication Sig Dispense Refill  . albuterol (PROVENTIL HFA;VENTOLIN HFA) 108 (90 Base) MCG/ACT inhaler Inhale 2 puffs into  the lungs every 4 (four) hours as needed for wheezing or shortness of breath.    . ALPRAZolam (XANAX) 0.25 MG tablet Take 2 tablets (0.5 mg total) by mouth at bedtime as needed for anxiety. 30 tablet 0  . amLODipine (NORVASC) 5 MG tablet Take 5 mg by mouth daily.      . cyclobenzaprine (FLEXERIL) 10 MG tablet Take 10 mg by mouth once as needed for muscle spasms.    Marland Kitchen dicyclomine (BENTYL) 20 MG tablet Take 20 mg by mouth 3 (three) times  daily as needed for spasms.    . diphenoxylate-atropine (LOMOTIL) 2.5-0.025 MG tablet Take 1 tablet by mouth 4 (four) times daily as needed for diarrhea or loose stools (take if immodium does not work). 30 tablet 0  . estazolam (PROSOM) 2 MG tablet Take 2 mg by mouth at bedtime.    Marland Kitchen estradiol (ESTRACE) 2 MG tablet Take 2 mg by mouth daily.      Marland Kitchen FLUoxetine (PROZAC) 40 MG capsule Take 40 mg by mouth daily.   2  . HYDROcodone-acetaminophen (NORCO/VICODIN) 5-325 MG tablet Take 1 tablet by mouth every 4 (four) hours as needed for moderate pain.   0  . levothyroxine (SYNTHROID, LEVOTHROID) 50 MCG tablet Take 50 mcg by mouth daily before breakfast.     . lidocaine-prilocaine (EMLA) cream Apply 1 application topically as needed. To numb skin over port a cath: Squeeze a  small amount on cotton ball and place over port site 1-2 hours prior to chemotherapy. 30 g 0  . loperamide (IMODIUM) 2 MG capsule Take 2 mg by mouth every 2 (two) hours as needed for diarrhea or loose stools.    . magnesium oxide (MAG-OX) 400 (241.3 Mg) MG tablet Take 1 tablet (400 mg total) by mouth 2 (two) times daily. 60 tablet 1  . methylPREDNISolone (MEDROL DOSEPAK) 4 MG TBPK tablet Take per package instructions. 21 tablet 0  . metoprolol succinate (TOPROL-XL) 25 MG 24 hr tablet Take 25 mg by mouth daily.    . nitrofurantoin, macrocrystal-monohydrate, (MACROBID) 100 MG capsule Take 1 capsule (100 mg total) by mouth 2 (two) times daily. 10 capsule 0  . ondansetron (ZOFRAN-ODT) 4 MG disintegrating tablet Dissolve one tablet by mouth every 8 hours as needed for nausea and vomiting, 20 tablet 1  . pantoprazole (PROTONIX) 40 MG tablet Take 40 mg by mouth 2 (two) times daily before a meal.   10  . potassium chloride SA (K-DUR,KLOR-CON) 20 MEQ tablet Take 20 mEq by mouth daily as needed (for supplement).    . promethazine (PHENERGAN) 25 MG tablet TAKE ONE TABLET BY MOUTH EVERY 6 HOURS AS NEEDED. (Patient taking differently: Take 25 mg by  mouth every 6 (six) hours as needed for nausea or vomiting. ) 30 tablet 0  . senna-docusate (SENOKOT-S) 8.6-50 MG tablet Take 1 tablet by mouth daily as needed for mild constipation or moderate constipation.     . tizanidine (ZANAFLEX) 2 MG capsule Take 1 capsule (2 mg total) by mouth 3 (three) times daily as needed for muscle spasms. 30 capsule 0   No current facility-administered medications for this visit.     SURGICAL HISTORY:  Past Surgical History:  Procedure Laterality Date  . BIOPSY N/A 05/25/2013   Procedure: BIOPSIES (Random Colon; Duodenal; Gastric);  Surgeon: Danie Binder, MD;  Location: [facility];  Service: Endoscopy;  Laterality: N/A;  . BLADDER SUSPENSION    . BREAST ENHANCEMENT SURGERY    . BREAST IMPLANT REMOVAL    .  CERVICAL FUSION  AUG 2013  . CHOLECYSTECTOMY  1999  . COLONOSCOPY  2007 [place]   POLYPS  . COLONOSCOPY WITH PROPOFOL N/A 05/25/2013   Procedure: COLONOSCOPY WITH PROPOFOL(at cecum 0957) total withdrawal time=42min);  Surgeon: Danie Binder, MD;  Location: [facility];  Service: Endoscopy;  Laterality: N/A;  . ESOPHAGOGASTRODUODENOSCOPY (EGD) WITH PROPOFOL N/A 05/25/2013   Procedure: ESOPHAGOGASTRODUODENOSCOPY (EGD) WITH PROPOFOL;  Surgeon: Danie Binder, MD;  Location: [facility];  Service: Endoscopy;  Laterality: N/A;  . FOOT SURGERY    . IR FLUORO GUIDE PORT INSERTION RIGHT  03/04/2018  . IR US GUIDE VASC ACCESS RIGHT  03/04/2018  . POLYPECTOMY N/A 05/25/2013   Procedure: POLYPECTOMY (Rectal and Gastric);  Surgeon: Danie Binder, MD;  Location: [facility];  Service: Endoscopy;  Laterality: N/A;  . TONSILLECTOMY    . UPPER GASTROINTESTINAL ENDOSCOPY    . VIDEO BRONCHOSCOPY WITH ENDOBRONCHIAL ULTRASOUND  09/12/2016   Procedure: VIDEO BRONCHOSCOPY WITH ENDOBRONCHIAL ULTRASOUND AND BIOPSY;  Surgeon: Juanito Doom, MD;  Location: [facility];  Service: Cardiopulmonary;;    REVIEW OF SYSTEMS:  A comprehensive review of systems was negative except for: Constitutional: positive  for fatigue Musculoskeletal: positive for arthralgias   PHYSICAL EXAMINATION: General appearance: alert, cooperative, fatigued and no distress Head: Normocephalic, without obvious abnormality, atraumatic Neck: no adenopathy, no JVD, supple, symmetrical, trachea midline and thyroid not enlarged, symmetric, no tenderness/mass/nodules Lymph nodes: Cervical, supraclavicular, and axillary nodes normal. Resp: clear to auscultation bilaterally Back: symmetric, no curvature. ROM normal. No CVA tenderness. Cardio: regular rate and rhythm, S1, S2 normal, no murmur, click, rub or gallop GI: soft, non-tender; bowel sounds normal; no masses,  no organomegaly Extremities: extremities normal, atraumatic, no cyanosis or edema   ECOG PERFORMANCE STATUS: 1 - Symptomatic but completely ambulatory  Blood pressure (!) 143/75, pulse (!) 59, temperature 98.3 F (36.8 C), temperature source Oral, resp. rate 20, height 5\' 4"  (1.626 m), weight 157 lb 12.8 oz (71.6 kg), SpO2 100 %.  LABORATORY DATA: Lab Results  Component Value Date   WBC 5.3 02/02/2019   HGB 11.6 (L) 02/02/2019   HCT 35.4 (L) 02/02/2019   MCV 99.4 02/02/2019   PLT 160 02/02/2019      Chemistry      Component Value Date/Time   NA 144 01/12/2019 1009   NA 141 09/08/2017 1009   K 3.9 01/12/2019 1009   K 3.5 09/08/2017 1009   CL 106 01/12/2019 1009   CO2 28 01/12/2019 1009   CO2 29 09/08/2017 1009   BUN 7 (L) 01/12/2019 1009   BUN 5.8 (L) 09/08/2017 1009   CREATININE 0.77 01/12/2019 1009   CREATININE 0.7 09/08/2017 1009      Component Value Date/Time   CALCIUM 8.0 (L) 01/12/2019 1009   CALCIUM 9.0 09/08/2017 1009   ALKPHOS 53 01/12/2019 1009   ALKPHOS 49 09/08/2017 1009   AST 9 (L) 01/12/2019 1009   AST 9 09/08/2017 1009   ALT 6 01/12/2019 1009   ALT <6 09/08/2017 1009   BILITOT 0.6 01/12/2019 1009   BILITOT 0.51 09/08/2017 1009       RADIOGRAPHIC STUDIES: Dg Chest 2 View  Result Date: 01/06/2019 CLINICAL DATA:   Abdominal pain with nausea, vomiting, and diarrhea. Currently undergoing treatment for lung cancer. EXAM: CHEST - 2 VIEW COMPARISON:  Chest x-ray dated 07/07/2017 and CT scan of the chest dated 10/27/2018 FINDINGS: Power port in place with the tip in the superior vena cava just below the carina in good  position. The heart size and pulmonary vascularity are normal. The patchy infiltrates in the right lung seen on the prior chest CT have resolved. There is scarring in the medial aspect of the left upper lobe. On the lateral view there is fullness in the aortopulmonary window consistent with the adenopathy noted on the recent CT scan. No effusions. There is also slight increased density medially at the left lung base felt to be secondary to prior radiation therapy, also as demonstrated on the prior CT scan. IMPRESSION: No acute abnormality in the chest. Electronically Signed   By: Lorriane Shire M.D.   On: 01/06/2019 13:05   Ct Chest W Contrast  Result Date: 01/08/2019 CLINICAL DATA:  Small-cell lung cancer, restaging EXAM: CT CHEST WITH CONTRAST TECHNIQUE: Multidetector CT imaging of the chest was performed during intravenous contrast administration. CONTRAST:  63mL OMNIPAQUE IOHEXOL 300 MG/ML  SOLN COMPARISON:  10/27/2018 FINDINGS: Cardiovascular: No significant vascular findings. Normal heart size. No pericardial effusion. Right chest port catheter. Mediastinum/Nodes: No significant change in a soft tissue mass centered about the AP window measuring approximately 2.9 cm with associated abnormal left hilar soft tissue. (Series 2, image 51). Thyroid gland, trachea, and esophagus demonstrate no significant findings. Lungs/Pleura: Mild centrilobular emphysema. There has been interval increase in bilateral upper lobe paramediastinal scarring and volume loss, more conspicuous on the right, in keeping with evolution of radiation change. Multifocal ground-glass and consolidative opacities of the right lung with central  clearing have resolved. Bibasilar bandlike scarring or atelectasis. Trace left pleural fluid. Upper Abdomen: No acute abnormality. Please see recent separately reported CT examination of the abdomen and pelvis. Musculoskeletal: No chest wall mass or suspicious bone lesions identified. IMPRESSION: 1. No significant change in a soft tissue mass centered about the AP window measuring approximately 2.9 cm with associated abnormal left hilar soft tissue. (Series 2, image 51). 2. There has been interval increase in bilateral upper lobe paramediastinal scarring and volume loss, more conspicuous on the right, in keeping with evolution of radiation change. 3. Multifocal ground-glass and consolidative opacities of the right lung seen on prior examination have resolved. 4.  Emphysema. Electronically Signed   By: Eddie Candle M.D.   On: 01/08/2019 11:23   Ct Abdomen Pelvis W Contrast  Result Date: 01/06/2019 CLINICAL DATA:  Nausea, vomiting and diarrhea with abdominal pain for the past 3 days. History of metastatic lung cancer with her last chemotherapy treatment on 12/29/2018. EXAM: CT ABDOMEN AND PELVIS WITH CONTRAST TECHNIQUE: Multidetector CT imaging of the abdomen and pelvis was performed using the standard protocol following bolus administration of intravenous contrast. CONTRAST:  111mL OMNIPAQUE IOHEXOL 300 MG/ML  SOLN COMPARISON:  11/01/2018.  PET-CT dated 02/09/2018. FINDINGS: Lower chest: Mildly progressive atelectasis in the medial aspect of the left lower lobe, partially surrounding the descending thoracic aorta. Interval decrease in the small amount of adjacent pleural fluid/thickening. Mild dependent atelectasis in the right lower lobe. Hepatobiliary: Stable mild diffuse low density of the liver and cholecystectomy clips. Mild post cholecystectomy intrahepatic ductal dilatation with mild improvement. The previously demonstrated oval area of low density in the posterior aspect of the lateral segment of the left  lobe of the liver appears smaller and less prominent. This previously measured 2.5 cm in maximum diameter and currently measures 1.0 cm in maximum diameter on image number 27 series 2. Pancreas: Unremarkable. No pancreatic ductal dilatation or surrounding inflammatory changes. Spleen: Normal in size without focal abnormality. Adrenals/Urinary Tract: Normal appearing adrenal glands. Stable small upper pole  right renal cyst. Interval mild right renal collecting system and ureter mucosal thickening and enhancement. Interval minimal similar changes on the left. Unremarkable urinary bladder. No urinary tract calculi or hydronephrosis seen. Stomach/Bowel: Moderately large hiatal hernia without significant change. Multiple colonic diverticula without evidence of diverticulitis. No evidence of appendicitis. Mild mucosal enhancement in the right colon, cecum and proximal transverse colon without significant thickening. Previously noted duodenal periampullary diverticulum. Vascular/Lymphatic: Atheromatous arterial calcifications without aneurysm. No enlarged lymph nodes. Reproductive: Status post hysterectomy. No adnexal masses. Other: No abdominal wall hernia or abnormality. No abdominopelvic ascites. Musculoskeletal: The previously demonstrated 2 cm oval sclerotic lesion in the L3 vertebral body is unchanged. Lumbar and lower thoracic spine degenerative changes. IMPRESSION: 1. Mild mucosal enhancement in the right colon, cecum and proximal transverse colon without significant thickening. This could be due to mild colitis. 2. Interval mild right renal collecting system and ureter mucosal thickening and enhancement. This can be seen with urinary tract infection. Interval minimal similar changes on the left. 3. Interval decrease in size of the previously demonstrated left lobe liver metastasis. 4. Stable moderate-sized hiatal hernia. 5. Stable L3 vertebral body sclerotic lesion compatible with a treated previously demonstrated  metastasis. Electronically Signed   By: Claudie Revering M.D.   On: 01/06/2019 15:36    ASSESSMENT AND PLAN:  This is a very pleasant 63 years old white female was limited stage small cell lung cancer, status post 6 cycles of systemic chemotherapy with carboplatin and etoposide concurrent with radiation and followed by prophylactic cranial irradiation. Patient has been in observation for more than a year.  She has been doing fine but recently started complaining of increasing fatigue and weakness as well as headache. The patient had evidence for disease recurrence and she was a started on treatment with systemic chemotherapy with carboplatin, etoposide and Tecentriq.  She is status post 6 cycles.  The patient had no evidence for disease progression after the induction phase of her treatment.  She was started on maintenance treatment with Tecentriq Huey Bienenstock) status post 6 cycles.  She has been tolerating this treatment well.   She had evidence for disease progression and the patient was started on systemic chemotherapy with cisplatin and irinotecan on days 1 and 8.  She is tolerating her treatment much better after her dose was reduced to cisplatin 25 mg/M2 and irinotecan 50 mg/M2 on days 1 and 8.  She is status post 3 cycles. She has been off treatment for the last few weeks because of significant fatigue and pancytopenia as well as electrolyte imbalance. The patient is feeling much better today. I recommended for her to resume her treatment with reduced dose cisplatin and irinotecan. The patient will come back for follow-up visit in 3 weeks for evaluation before the next cycle of her treatment. For the hypomagnesemia we will add 2 g of magnesium sulfate to her IV fluid tomorrow. The patient will come back for follow-up visit in 3 weeks for evaluation before starting cycle #5. She was advised to call immediately if she has any concerning symptoms in the interval. The patient voices understanding of  current disease status and treatment options and is in agreement with the current care plan. All questions were answered. The patient knows to call the clinic with any problems, questions or concerns. We can certainly see the patient much sooner if necessary.   Disclaimer: This note was dictated with voice recognition software. Similar sounding words can inadvertently be transcribed and may not be corrected upon  review.

## 2019-02-03 ENCOUNTER — Inpatient Hospital Stay: Payer: Medicare Other

## 2019-02-03 VITALS — BP 122/69 | HR 62 | Temp 97.5°F | Resp 18

## 2019-02-03 DIAGNOSIS — R21 Rash and other nonspecific skin eruption: Secondary | ICD-10-CM | POA: Diagnosis not present

## 2019-02-03 DIAGNOSIS — Z5111 Encounter for antineoplastic chemotherapy: Secondary | ICD-10-CM | POA: Diagnosis not present

## 2019-02-03 DIAGNOSIS — D696 Thrombocytopenia, unspecified: Secondary | ICD-10-CM | POA: Diagnosis not present

## 2019-02-03 DIAGNOSIS — C3492 Malignant neoplasm of unspecified part of left bronchus or lung: Secondary | ICD-10-CM

## 2019-02-03 DIAGNOSIS — C787 Secondary malignant neoplasm of liver and intrahepatic bile duct: Secondary | ICD-10-CM | POA: Diagnosis not present

## 2019-02-03 DIAGNOSIS — R197 Diarrhea, unspecified: Secondary | ICD-10-CM | POA: Diagnosis not present

## 2019-02-03 DIAGNOSIS — D6481 Anemia due to antineoplastic chemotherapy: Secondary | ICD-10-CM | POA: Diagnosis not present

## 2019-02-03 DIAGNOSIS — E039 Hypothyroidism, unspecified: Secondary | ICD-10-CM | POA: Diagnosis not present

## 2019-02-03 DIAGNOSIS — C3412 Malignant neoplasm of upper lobe, left bronchus or lung: Secondary | ICD-10-CM | POA: Diagnosis not present

## 2019-02-03 DIAGNOSIS — C7951 Secondary malignant neoplasm of bone: Secondary | ICD-10-CM | POA: Diagnosis not present

## 2019-02-03 MED ORDER — HEPARIN SOD (PORK) LOCK FLUSH 100 UNIT/ML IV SOLN
500.0000 [IU] | Freq: Once | INTRAVENOUS | Status: AC | PRN
  Administered 2019-02-03: 500 [IU]
  Filled 2019-02-03: qty 5

## 2019-02-03 MED ORDER — SODIUM CHLORIDE 0.9 % IV SOLN
25.0000 mg/m2 | Freq: Once | INTRAVENOUS | Status: AC
  Administered 2019-02-03: 45 mg via INTRAVENOUS
  Filled 2019-02-03: qty 45

## 2019-02-03 MED ORDER — PALONOSETRON HCL INJECTION 0.25 MG/5ML
0.2500 mg | Freq: Once | INTRAVENOUS | Status: AC
  Administered 2019-02-03: 0.25 mg via INTRAVENOUS

## 2019-02-03 MED ORDER — IRINOTECAN HCL CHEMO INJECTION 100 MG/5ML
50.0000 mg/m2 | Freq: Once | INTRAVENOUS | Status: AC
  Administered 2019-02-03: 80 mg via INTRAVENOUS
  Filled 2019-02-03: qty 4

## 2019-02-03 MED ORDER — ATROPINE SULFATE 1 MG/ML IJ SOLN
INTRAMUSCULAR | Status: AC
  Filled 2019-02-03: qty 1

## 2019-02-03 MED ORDER — SODIUM CHLORIDE 0.9 % IV SOLN
Freq: Once | INTRAVENOUS | Status: AC
  Administered 2019-02-03: 11:00:00 via INTRAVENOUS
  Filled 2019-02-03: qty 5

## 2019-02-03 MED ORDER — POTASSIUM CHLORIDE 2 MEQ/ML IV SOLN
Freq: Once | INTRAVENOUS | Status: AC
  Administered 2019-02-03: 09:00:00 via INTRAVENOUS
  Filled 2019-02-03: qty 1000

## 2019-02-03 MED ORDER — PALONOSETRON HCL INJECTION 0.25 MG/5ML
INTRAVENOUS | Status: AC
  Filled 2019-02-03: qty 5

## 2019-02-03 MED ORDER — ATROPINE SULFATE 1 MG/ML IJ SOLN
0.4000 mg | Freq: Once | INTRAMUSCULAR | Status: AC | PRN
  Administered 2019-02-03: 0.4 mg via INTRAVENOUS

## 2019-02-03 MED ORDER — ATROPINE SULFATE 1 MG/ML IJ SOLN: 0.5000 mg | Freq: Once | INTRAMUSCULAR | Status: DC | PRN

## 2019-02-03 MED ORDER — SODIUM CHLORIDE 0.9% FLUSH
10.0000 mL | INTRAVENOUS | Status: DC | PRN
  Administered 2019-02-03: 10 mL
  Filled 2019-02-03: qty 10

## 2019-02-03 MED ORDER — SODIUM CHLORIDE 0.9 % IV SOLN
Freq: Once | INTRAVENOUS | Status: AC
  Administered 2019-02-03: 08:00:00 via INTRAVENOUS
  Filled 2019-02-03: qty 250

## 2019-02-03 NOTE — Patient Instructions (Signed)
[facility] Discharge Instructions for Patients Receiving Chemotherapy  Today you received the following chemotherapy agents Cisplatin and Irinotecan  To help prevent nausea and vomiting after your treatment, we encourage you to take your nausea medication as directed.    If you develop nausea and vomiting that is not controlled by your nausea medication, call the clinic.   BELOW ARE SYMPTOMS THAT SHOULD BE REPORTED IMMEDIATELY:  *FEVER GREATER THAN 100.5 F  *CHILLS WITH OR WITHOUT FEVER  NAUSEA AND VOMITING THAT IS NOT CONTROLLED WITH YOUR NAUSEA MEDICATION  *UNUSUAL SHORTNESS OF BREATH  *UNUSUAL BRUISING OR BLEEDING  TENDERNESS IN MOUTH AND THROAT WITH OR WITHOUT PRESENCE OF ULCERS  *URINARY PROBLEMS  *BOWEL PROBLEMS  UNUSUAL RASH Items with * indicate a potential emergency and should be followed up as soon as possible.  Feel free to call the clinic should you have any questions or concerns. The clinic phone number is [phone].  Please show the [facility] at check-in to the Emergency Department and triage nurse.

## 2019-02-05 ENCOUNTER — Other Ambulatory Visit: Payer: Self-pay | Admitting: Medical

## 2019-02-05 ENCOUNTER — Other Ambulatory Visit: Payer: Self-pay | Admitting: Internal Medicine

## 2019-02-05 DIAGNOSIS — R197 Diarrhea, unspecified: Secondary | ICD-10-CM

## 2019-02-10 ENCOUNTER — Inpatient Hospital Stay: Payer: Medicare Other

## 2019-02-10 ENCOUNTER — Other Ambulatory Visit: Payer: Self-pay

## 2019-02-10 VITALS — BP 116/75 | HR 67 | Temp 97.8°F | Resp 16 | Wt 156.0 lb

## 2019-02-10 DIAGNOSIS — R112 Nausea with vomiting, unspecified: Secondary | ICD-10-CM

## 2019-02-10 DIAGNOSIS — Z5111 Encounter for antineoplastic chemotherapy: Secondary | ICD-10-CM | POA: Diagnosis not present

## 2019-02-10 DIAGNOSIS — R197 Diarrhea, unspecified: Secondary | ICD-10-CM | POA: Diagnosis not present

## 2019-02-10 DIAGNOSIS — E860 Dehydration: Secondary | ICD-10-CM

## 2019-02-10 DIAGNOSIS — C787 Secondary malignant neoplasm of liver and intrahepatic bile duct: Secondary | ICD-10-CM | POA: Diagnosis not present

## 2019-02-10 DIAGNOSIS — C3412 Malignant neoplasm of upper lobe, left bronchus or lung: Secondary | ICD-10-CM

## 2019-02-10 DIAGNOSIS — C7951 Secondary malignant neoplasm of bone: Secondary | ICD-10-CM | POA: Diagnosis not present

## 2019-02-10 DIAGNOSIS — D696 Thrombocytopenia, unspecified: Secondary | ICD-10-CM | POA: Diagnosis not present

## 2019-02-10 DIAGNOSIS — R21 Rash and other nonspecific skin eruption: Secondary | ICD-10-CM | POA: Diagnosis not present

## 2019-02-10 DIAGNOSIS — D6481 Anemia due to antineoplastic chemotherapy: Secondary | ICD-10-CM | POA: Diagnosis not present

## 2019-02-10 DIAGNOSIS — C3492 Malignant neoplasm of unspecified part of left bronchus or lung: Secondary | ICD-10-CM

## 2019-02-10 DIAGNOSIS — E039 Hypothyroidism, unspecified: Secondary | ICD-10-CM | POA: Diagnosis not present

## 2019-02-10 LAB — CMP (CANCER CENTER ONLY)
ALT: 10 U/L (ref 0–44)
AST: 12 U/L — ABNORMAL LOW (ref 15–41)
Albumin: 3.3 g/dL — ABNORMAL LOW (ref 3.5–5.0)
Alkaline Phosphatase: 67 U/L (ref 38–126)
Anion gap: 11 (ref 5–15)
BUN: 8 mg/dL (ref 8–23)
CHLORIDE: 106 mmol/L (ref 98–111)
CO2: 24 mmol/L (ref 22–32)
Calcium: 7.9 mg/dL — ABNORMAL LOW (ref 8.9–10.3)
Creatinine: 0.68 mg/dL (ref 0.44–1.00)
GFR, Est AFR Am: >60 mL/min (ref >60)
GFR, Estimated: >60 mL/min (ref >60)
Glucose, Bld: 84 mg/dL (ref 70–99)
Potassium: 3.8 mmol/L (ref 3.5–5.1)
Sodium: 141 mmol/L (ref 135–145)
Total Bilirubin: 0.6 mg/dL (ref 0.3–1.2)
Total Protein: 6.2 g/dL — ABNORMAL LOW (ref 6.5–8.1)

## 2019-02-10 LAB — MAGNESIUM: Magnesium: 1.2 mg/dL — CL (ref 1.7–2.4)

## 2019-02-10 LAB — CBC WITH DIFFERENTIAL (CANCER CENTER ONLY)
Abs Immature Granulocytes: 0.09 10*3/uL — ABNORMAL HIGH (ref 0.00–0.07)
Basophils Absolute: 0 10*3/uL (ref 0.0–0.1)
Basophils Relative: 0 %
Eosinophils Absolute: 0.3 10*3/uL (ref 0.0–0.5)
Eosinophils Relative: 5 %
HCT: 33 % — ABNORMAL LOW (ref 36.0–46.0)
Hemoglobin: 11 g/dL — ABNORMAL LOW (ref 12.0–15.0)
Immature Granulocytes: 1 %
Lymphocytes Relative: 25 %
Lymphs Abs: 1.7 10*3/uL (ref 0.7–4.0)
MCH: 33.1 pg (ref 26.0–34.0)
MCHC: 33.3 g/dL (ref 30.0–36.0)
MCV: 99.4 fL (ref 80.0–100.0)
MONO ABS: 0.6 10*3/uL (ref 0.1–1.0)
MONOS PCT: 8 %
NEUTROS ABS: 4.3 10*3/uL (ref 1.7–7.7)
NEUTROS PCT: 61 %
Platelet Count: 115 10*3/uL — ABNORMAL LOW (ref 150–400)
RBC: 3.32 MIL/uL — ABNORMAL LOW (ref 3.87–5.11)
RDW: 18.7 % — ABNORMAL HIGH (ref 11.5–15.5)
WBC Count: 7 10*3/uL (ref 4.0–10.5)
nRBC: 0 % (ref 0.0–0.2)

## 2019-02-10 MED ORDER — ATROPINE SULFATE 1 MG/ML IJ SOLN
0.5000 mg | Freq: Once | INTRAMUSCULAR | Status: AC | PRN
  Administered 2019-02-10: 0.5 mg via INTRAVENOUS

## 2019-02-10 MED ORDER — HEPARIN SOD (PORK) LOCK FLUSH 100 UNIT/ML IV SOLN
500.0000 [IU] | Freq: Once | INTRAVENOUS | Status: AC | PRN
  Administered 2019-02-10: 500 [IU]
  Filled 2019-02-10: qty 5

## 2019-02-10 MED ORDER — SODIUM CHLORIDE 0.9 % IV SOLN
25.0000 mg/m2 | Freq: Once | INTRAVENOUS | Status: AC
  Administered 2019-02-10: 45 mg via INTRAVENOUS
  Filled 2019-02-10: qty 45

## 2019-02-10 MED ORDER — IRINOTECAN HCL CHEMO INJECTION 100 MG/5ML
44.0000 mg/m2 | Freq: Once | INTRAVENOUS | Status: AC
  Administered 2019-02-10: 80 mg via INTRAVENOUS
  Filled 2019-02-10: qty 4

## 2019-02-10 MED ORDER — SODIUM CHLORIDE 0.9 % IV SOLN
Freq: Once | INTRAVENOUS | Status: AC
  Administered 2019-02-10: 12:00:00 via INTRAVENOUS
  Filled 2019-02-10: qty 5

## 2019-02-10 MED ORDER — PALONOSETRON HCL INJECTION 0.25 MG/5ML
INTRAVENOUS | Status: AC
  Filled 2019-02-10: qty 5

## 2019-02-10 MED ORDER — PALONOSETRON HCL INJECTION 0.25 MG/5ML
0.2500 mg | Freq: Once | INTRAVENOUS | Status: AC
  Administered 2019-02-10: 0.25 mg via INTRAVENOUS

## 2019-02-10 MED ORDER — SODIUM CHLORIDE 0.9 % IV SOLN
Freq: Once | INTRAVENOUS | Status: AC
  Administered 2019-02-10: 09:00:00 via INTRAVENOUS
  Filled 2019-02-10: qty 250

## 2019-02-10 MED ORDER — POTASSIUM CHLORIDE 2 MEQ/ML IV SOLN
Freq: Once | INTRAVENOUS | Status: AC
  Administered 2019-02-10: 09:00:00 via INTRAVENOUS
  Filled 2019-02-10: qty 10

## 2019-02-10 MED ORDER — SODIUM CHLORIDE 0.9% FLUSH
10.0000 mL | INTRAVENOUS | Status: DC | PRN
  Administered 2019-02-10: 10 mL
  Filled 2019-02-10: qty 10

## 2019-02-10 MED ORDER — ATROPINE SULFATE 1 MG/ML IJ SOLN
INTRAMUSCULAR | Status: AC
  Filled 2019-02-10: qty 1

## 2019-02-10 NOTE — Progress Notes (Signed)
Spoke w/ Dr. Julien Nordmann, add additional 3 grams magnesium IV to cisplatin fluids for level of 1.2. (Total magnesium dose = 4.5 grams (36 mEq).   Demetrius Charity, PharmD, [facility] Pharmacist Pharmacy Phone: [phone] 02/10/2019

## 2019-02-10 NOTE — Patient Instructions (Signed)
[facility] Discharge Instructions for Patients Receiving Chemotherapy  Today you received the following chemotherapy agents Irinotecan (CAMPTOSAR) & Cisplatin (PLATINOL).  To help prevent nausea and vomiting after your treatment, we encourage you to take your nausea medication as prescribed.   If you develop nausea and vomiting that is not controlled by your nausea medication, call the clinic.   BELOW ARE SYMPTOMS THAT SHOULD BE REPORTED IMMEDIATELY:  *FEVER GREATER THAN 100.5 F  *CHILLS WITH OR WITHOUT FEVER  NAUSEA AND VOMITING THAT IS NOT CONTROLLED WITH YOUR NAUSEA MEDICATION  *UNUSUAL SHORTNESS OF BREATH  *UNUSUAL BRUISING OR BLEEDING  TENDERNESS IN MOUTH AND THROAT WITH OR WITHOUT PRESENCE OF ULCERS  *URINARY PROBLEMS  *BOWEL PROBLEMS  UNUSUAL RASH Items with * indicate a potential emergency and should be followed up as soon as possible.  Feel free to call the clinic should you have any questions or concerns. The clinic phone number is [phone].  Please show the [facility] at check-in to the Emergency Department and triage nurse.

## 2019-02-15 ENCOUNTER — Telehealth: Payer: Self-pay | Admitting: Medical Oncology

## 2019-02-15 ENCOUNTER — Other Ambulatory Visit: Payer: Self-pay | Admitting: Medical Oncology

## 2019-02-15 NOTE — Telephone Encounter (Signed)
Chemo schedule-/Pain med refill- She is confused about her schedule. She is asking for [facility] to refill because her PCP is unavailable. Mailbox full and other phone busy.

## 2019-02-19 NOTE — Telephone Encounter (Signed)
Please advise refill? 

## 2019-02-22 ENCOUNTER — Other Ambulatory Visit: Payer: Self-pay | Admitting: Medical Oncology

## 2019-02-22 ENCOUNTER — Telehealth: Payer: Self-pay | Admitting: Medical Oncology

## 2019-02-22 DIAGNOSIS — R112 Nausea with vomiting, unspecified: Secondary | ICD-10-CM

## 2019-02-22 MED ORDER — ONDANSETRON 4 MG PO TBDP: ORAL_TABLET | ORAL | 1 refills | Status: AC

## 2019-02-22 NOTE — Telephone Encounter (Signed)
Refill requested for zofran . LVM to keep appt this week.

## 2019-02-24 ENCOUNTER — Inpatient Hospital Stay: Payer: Medicare Other

## 2019-02-24 ENCOUNTER — Other Ambulatory Visit: Payer: Self-pay

## 2019-02-24 ENCOUNTER — Inpatient Hospital Stay (HOSPITAL_BASED_OUTPATIENT_CLINIC_OR_DEPARTMENT_OTHER): Payer: Medicare Other | Admitting: Physician Assistant

## 2019-02-24 ENCOUNTER — Encounter: Payer: Self-pay | Admitting: Physician Assistant

## 2019-02-24 ENCOUNTER — Telehealth: Payer: Self-pay | Admitting: Physician Assistant

## 2019-02-24 VITALS — BP 121/63 | HR 64 | Temp 97.7°F | Resp 18 | Ht 64.0 in | Wt 157.1 lb

## 2019-02-24 DIAGNOSIS — D6481 Anemia due to antineoplastic chemotherapy: Secondary | ICD-10-CM | POA: Diagnosis not present

## 2019-02-24 DIAGNOSIS — C3412 Malignant neoplasm of upper lobe, left bronchus or lung: Secondary | ICD-10-CM

## 2019-02-24 DIAGNOSIS — E860 Dehydration: Secondary | ICD-10-CM

## 2019-02-24 DIAGNOSIS — R197 Diarrhea, unspecified: Secondary | ICD-10-CM | POA: Diagnosis not present

## 2019-02-24 DIAGNOSIS — C7951 Secondary malignant neoplasm of bone: Secondary | ICD-10-CM | POA: Diagnosis not present

## 2019-02-24 DIAGNOSIS — Z5111 Encounter for antineoplastic chemotherapy: Secondary | ICD-10-CM | POA: Diagnosis not present

## 2019-02-24 DIAGNOSIS — R112 Nausea with vomiting, unspecified: Secondary | ICD-10-CM

## 2019-02-24 DIAGNOSIS — D696 Thrombocytopenia, unspecified: Secondary | ICD-10-CM

## 2019-02-24 DIAGNOSIS — R21 Rash and other nonspecific skin eruption: Secondary | ICD-10-CM | POA: Diagnosis not present

## 2019-02-24 DIAGNOSIS — R110 Nausea: Secondary | ICD-10-CM

## 2019-02-24 DIAGNOSIS — C3492 Malignant neoplasm of unspecified part of left bronchus or lung: Secondary | ICD-10-CM

## 2019-02-24 DIAGNOSIS — E039 Hypothyroidism, unspecified: Secondary | ICD-10-CM | POA: Diagnosis not present

## 2019-02-24 DIAGNOSIS — C787 Secondary malignant neoplasm of liver and intrahepatic bile duct: Secondary | ICD-10-CM

## 2019-02-24 LAB — CMP (CANCER CENTER ONLY)
ALK PHOS: 60 U/L (ref 38–126)
ALT: 10 U/L (ref 0–44)
AST: 10 U/L — ABNORMAL LOW (ref 15–41)
Albumin: 3.3 g/dL — ABNORMAL LOW (ref 3.5–5.0)
Anion gap: 8 (ref 5–15)
BUN: 15 mg/dL (ref 8–23)
CALCIUM: 8 mg/dL — AB (ref 8.9–10.3)
CO2: 27 mmol/L (ref 22–32)
Chloride: 105 mmol/L (ref 98–111)
Creatinine: 0.7 mg/dL (ref 0.44–1.00)
GFR, Est AFR Am: >60 mL/min (ref >60)
GFR, Estimated: >60 mL/min (ref >60)
Glucose, Bld: 79 mg/dL (ref 70–99)
Potassium: 4.2 mmol/L (ref 3.5–5.1)
Sodium: 140 mmol/L (ref 135–145)
Total Bilirubin: 0.5 mg/dL (ref 0.3–1.2)
Total Protein: 6.1 g/dL — ABNORMAL LOW (ref 6.5–8.1)

## 2019-02-24 LAB — CBC WITH DIFFERENTIAL (CANCER CENTER ONLY)
Abs Immature Granulocytes: 0.02 10*3/uL (ref 0.00–0.07)
BASOS PCT: 0 %
Basophils Absolute: 0 10*3/uL (ref 0.0–0.1)
Eosinophils Absolute: 0.5 10*3/uL (ref 0.0–0.5)
Eosinophils Relative: 14 %
HCT: 29.6 % — ABNORMAL LOW (ref 36.0–46.0)
Hemoglobin: 9.9 g/dL — ABNORMAL LOW (ref 12.0–15.0)
Immature Granulocytes: 1 %
Lymphocytes Relative: 45 %
Lymphs Abs: 1.6 10*3/uL (ref 0.7–4.0)
MCH: 34.6 pg — ABNORMAL HIGH (ref 26.0–34.0)
MCHC: 33.4 g/dL (ref 30.0–36.0)
MCV: 103.5 fL — AB (ref 80.0–100.0)
MONO ABS: 0.3 10*3/uL (ref 0.1–1.0)
Monocytes Relative: 9 %
NEUTROS ABS: 1.1 10*3/uL — AB (ref 1.7–7.7)
Neutrophils Relative %: 31 %
Platelet Count: 72 10*3/uL — ABNORMAL LOW (ref 150–400)
RBC: 2.86 MIL/uL — ABNORMAL LOW (ref 3.87–5.11)
RDW: 18.6 % — ABNORMAL HIGH (ref 11.5–15.5)
WBC Count: 3.5 10*3/uL — ABNORMAL LOW (ref 4.0–10.5)
nRBC: 0 % (ref 0.0–0.2)

## 2019-02-24 LAB — MAGNESIUM: Magnesium: 1.1 mg/dL — CL (ref 1.7–2.4)

## 2019-02-24 MED ORDER — SODIUM CHLORIDE 0.9% FLUSH
10.0000 mL | INTRAVENOUS | Status: DC | PRN
  Administered 2019-02-24: 10 mL
  Filled 2019-02-24: qty 10

## 2019-02-24 NOTE — Telephone Encounter (Signed)
Patient's magnesium low at 1.1 today. Spoke to the patient. She was instructed to double her magnesium pills until we recheck her labs next week. We will draw magnesium again and if it is still low, we will consider adding magnesium IV during her treatment. She was warned that this may cause diarrhea and to keep her lomotil and imodium on hand. She expressed understanding. All questions answered.

## 2019-02-24 NOTE — Progress Notes (Signed)
[facility] OFFICE PROGRESS NOTE  Redmond School, MD [address] [place] [address]  DIAGNOSIS: Metastatic small cell lung cancer initially diagnosed as Limited stage (T1b, N2, M0) small cell lung cancer presented with left lower lobe/infrahilar mass and large mediastinal lymphadenopathy diagnosed in October 2017.  PRIOR THERAPY: 1) Systemic chemotherapy with cisplatin 60 MG/M2 on day 1 and etoposide 120 MG/M2 on days 1, 2 and 3 status post 1 cycle. This was discontinued secondary to intolerance. 2) Systemic chemotherapy with carboplatin for AUC of 4 on day 1 and etoposide 100 MG/M2 on days 1, 2 and 3 with Neulasta support on day 4 every 3 weeks. Status post 5 cycles. This was concurrent with radiation in [place], [place]. 3) prophylactic cranial irradiation. 4) Systemic chemotherapy with carboplatin for AUC of 5 on day 1, etoposide 100 mg/M2 on days 1, 2 and 3 as well as a Tecentriq (Atezolizumab) 1200 mg IV every 3 weeks with Neulasta support.  First dose February 17, 2018.  Status post 6 cycles of partial response. 5) Maintenance treatment with immunotherapy with Tecentriq 1200 mg IV every 3 weeks.  First dose June 30, 2018.  Status post 6 cycles.  Last dose was given October 13, 2018 discontinued secondary to disease progression  CURRENT THERAPY: Systemic chemotherapy with cisplatin 30 mg/M2 and irinotecan 65 mg/M2 on days 1 and 8 every 3 weeks.  First dose November 10, 2018.  Starting cycle #2 her dose of cisplatin was reduced to 25 mg/M2 and irinotecan 50 mg/M2 on days 1 and 8 every 3 weeks.  Status post 4 cycles.   INTERVAL HISTORY: Rebekah Henderson 63 y.o. female returns to clinic for follow-up visit. The patiently recently resumed treatment after taking several weeks off due to fatigue, electrolyte disturbances, and pancytopenia.  Since resuming treatment she is tolerating it fair without any concerning complaints except for delayed nausea and diarrhea. She states  that she takes Zofran and phenergan for nausea. Additionally, she has diarrhea for 1 day following treatment but states it subsides after using imodium and lomotil. She states she has approximately 4 loose stools following treatment. She denies associated mucus or bloody stool. Today she is feeling well.  She denies any fever, chills, night sweats, fatigue, or weight loss.  She denies any chest pain, shortness of breath, cough, or hemoptysis.  She denies any recent nausea, vomiting, diarrhea, or constipation. She stays hydrated by drinking several water bottles a day. She denies any headache or visual changes. The patient comes from [place] and has to rely on others for transportation.  She denies any rashes or skin changes.  She is here today for evaluation prior to starting cycle #5 today.   MEDICAL HISTORY: Past Medical History:  Diagnosis Date  . 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 Anxiety    takes Prozac daily  . Arthritis   . Benign fundic gland polyps of stomach   . Colon polyps   . COPD (chronic obstructive pulmonary disease) ([facility])   . Dehydration 03/06/2017  . Diverticulitis   . Dyspnea    with exertion  . Encounter for antineoplastic chemotherapy 12/03/2016  . Fibromyalgia   . GERD (gastroesophageal reflux disease)    takes Pantoprazole daily  . Hypertension    takes Metoprolol,Triamterene-HCTZ,and Amlodipine daily  . Hypothyroidism    takes Synthroid daily  . IBS (irritable bowel syndrome)   . lung ca dx'd 10/02/2016   skin, lung  . PONV (postoperative nausea and vomiting)  pt also states that she had some difficulty breathing after cervical fusion    ALLERGIES:  is allergic to penicillins; codeine; fentanyl; ranitidine hcl; keflex [cephalexin]; and lyrica [pregabalin].  MEDICATIONS:  Current Outpatient Medications  Medication Sig Dispense Refill  . albuterol (PROVENTIL HFA;VENTOLIN HFA) 108 (90 Base) MCG/ACT inhaler Inhale 2 puffs into the lungs  every 4 (four) hours as needed for wheezing or shortness of breath.    . ALPRAZolam (XANAX) 0.25 MG tablet Take 2 tablets (0.5 mg total) by mouth at bedtime as needed for anxiety. 30 tablet 0  . amLODipine (NORVASC) 5 MG tablet Take 5 mg by mouth daily.      . cyclobenzaprine (FLEXERIL) 10 MG tablet Take 10 mg by mouth once as needed for muscle spasms.    . cyclobenzaprine (FLEXERIL) 5 MG tablet TAKE ONE TABLET 3 TIMES A DAY AS NEEDED. 30 tablet 0  . dicyclomine (BENTYL) 20 MG tablet Take 20 mg by mouth 3 (three) times daily as needed for spasms.    . diphenoxylate-atropine (LOMOTIL) 2.5-0.025 MG tablet TAKE 1 TABLET BY MOUTH 4 TIMES DAILY AS NEEDED FOR LOOSE STOOLS. 30 tablet 0  . estazolam (PROSOM) 2 MG tablet Take 2 mg by mouth at bedtime.    Marland Kitchen estradiol (ESTRACE) 2 MG tablet Take 2 mg by mouth daily.      Marland Kitchen FLUoxetine (PROZAC) 40 MG capsule Take 40 mg by mouth daily.   2  . HYDROcodone-acetaminophen (NORCO/VICODIN) 5-325 MG tablet Take 1 tablet by mouth every 4 (four) hours as needed for moderate pain.   0  . levothyroxine (SYNTHROID, LEVOTHROID) 50 MCG tablet Take 50 mcg by mouth daily before breakfast.     . lidocaine-prilocaine (EMLA) cream Apply 1 application topically as needed. To numb skin over port a cath: Squeeze a  small amount on cotton ball and place over port site 1-2 hours prior to chemotherapy. 30 g 0  . loperamide (IMODIUM) 2 MG capsule Take 2 mg by mouth every 2 (two) hours as needed for diarrhea or loose stools.    . magnesium oxide (MAG-OX) 400 (241.3 Mg) MG tablet Take 1 tablet (400 mg total) by mouth 2 (two) times daily. 60 tablet 1  . metoprolol succinate (TOPROL-XL) 25 MG 24 hr tablet Take 25 mg by mouth daily.    . nitrofurantoin, macrocrystal-monohydrate, (MACROBID) 100 MG capsule Take 1 capsule (100 mg total) by mouth 2 (two) times daily. 10 capsule 0  . ondansetron (ZOFRAN-ODT) 4 MG disintegrating tablet Dissolve one tablet by mouth every 8 hours as needed for nausea  and vomiting, 20 tablet 1  . pantoprazole (PROTONIX) 40 MG tablet Take 40 mg by mouth 2 (two) times daily before a meal.   10  . potassium chloride SA (K-DUR,KLOR-CON) 20 MEQ tablet Take 20 mEq by mouth daily as needed (for supplement).    . promethazine (PHENERGAN) 25 MG tablet TAKE ONE TABLET BY MOUTH EVERY 6 HOURS AS NEEDED. (Patient taking differently: Take 25 mg by mouth every 6 (six) hours as needed for nausea or vomiting. ) 30 tablet 0  . senna-docusate (SENOKOT-S) 8.6-50 MG tablet Take 1 tablet by mouth daily as needed for mild constipation or moderate constipation.      No current facility-administered medications for this visit.     SURGICAL HISTORY:  Past Surgical History:  Procedure Laterality Date  . BIOPSY N/A 05/25/2013   Procedure: BIOPSIES (Random Colon; Duodenal; Gastric);  Surgeon: Danie Binder, MD;  Location: [facility];  Service:  Endoscopy;  Laterality: N/A;  . BLADDER SUSPENSION    . BREAST ENHANCEMENT SURGERY    . BREAST IMPLANT REMOVAL    . CERVICAL FUSION  AUG 2013  . CHOLECYSTECTOMY  1999  . COLONOSCOPY  2007 [place]   POLYPS  . COLONOSCOPY WITH PROPOFOL N/A 05/25/2013   Procedure: COLONOSCOPY WITH PROPOFOL(at cecum 0957) total withdrawal time=58min);  Surgeon: Danie Binder, MD;  Location: [facility];  Service: Endoscopy;  Laterality: N/A;  . ESOPHAGOGASTRODUODENOSCOPY (EGD) WITH PROPOFOL N/A 05/25/2013   Procedure: ESOPHAGOGASTRODUODENOSCOPY (EGD) WITH PROPOFOL;  Surgeon: Danie Binder, MD;  Location: [facility];  Service: Endoscopy;  Laterality: N/A;  . FOOT SURGERY    . IR FLUORO GUIDE PORT INSERTION RIGHT  03/04/2018  . IR US GUIDE VASC ACCESS RIGHT  03/04/2018  . POLYPECTOMY N/A 05/25/2013   Procedure: POLYPECTOMY (Rectal and Gastric);  Surgeon: Danie Binder, MD;  Location: [facility];  Service: Endoscopy;  Laterality: N/A;  . TONSILLECTOMY    . UPPER GASTROINTESTINAL ENDOSCOPY    . VIDEO BRONCHOSCOPY WITH ENDOBRONCHIAL ULTRASOUND  09/12/2016   Procedure: VIDEO  BRONCHOSCOPY WITH ENDOBRONCHIAL ULTRASOUND AND BIOPSY;  Surgeon: Juanito Doom, MD;  Location: [facility];  Service: Cardiopulmonary;;    REVIEW OF SYSTEMS:   Review of Systems  Constitutional: Negative for appetite change, chills, fatigue, fever and unexpected weight change.  HENT:   Negative for mouth sores, nosebleeds, sore throat and trouble swallowing.   Eyes: Negative for eye problems and icterus.  Respiratory: Negative for cough, hemoptysis, shortness of breath and wheezing.   Cardiovascular: Negative for chest pain and leg swelling.  Gastrointestinal: Positive for nausea and diarrhea following treatment. Negative for abdominal pain, constipation, and vomiting.  Genitourinary: Negative for bladder incontinence, difficulty urinating, dysuria, frequency and hematuria.   Musculoskeletal: Negative for back pain, gait problem, neck pain and neck stiffness.  Skin: Negative for itching and rash.  Neurological: Negative for dizziness, extremity weakness, gait problem, headaches, light-headedness and seizures.  Hematological: Negative for adenopathy. Does not bruise/bleed easily.  Psychiatric/Behavioral: Negative for confusion, depression and sleep disturbance. The patient is not nervous/anxious.     PHYSICAL EXAMINATION:  Blood pressure 121/63, pulse 64, temperature 97.7 F (36.5 C), temperature source Oral, resp. rate 18, height 5\' 4"  (1.626 m), weight 157 lb 1.6 oz (71.3 kg), SpO2 96 %.  ECOG PERFORMANCE STATUS: 1 - Symptomatic but completely ambulatory  Physical Exam  Constitutional: Oriented to person, place, and time and well-developed, well-nourished, and in no distress. No distress.  HENT:  Head: Normocephalic and atraumatic.  Mouth/Throat: Oropharynx is clear and moist. No oropharyngeal exudate.  Eyes: Conjunctivae are normal. Right eye exhibits no discharge. Left eye exhibits no discharge. No scleral icterus.  Neck: Normal range of motion. Neck supple.  Cardiovascular: Normal  rate, regular rhythm, normal heart sounds and intact distal pulses.   Pulmonary/Chest: Effort normal and breath sounds normal. No respiratory distress. No wheezes. No rales.  Abdominal: Soft. Bowel sounds are normal. Exhibits no distension and no mass. There is no tenderness.  Musculoskeletal: Normal range of motion. Exhibits no edema.  Lymphadenopathy:    No cervical adenopathy.  Neurological: Alert and oriented to person, place, and time. Exhibits normal muscle tone. Gait normal. Coordination normal.  Skin: Skin is warm and dry. No rash noted. Not diaphoretic. No erythema. No pallor.  Psychiatric: Mood, memory and judgment normal.  Vitals reviewed.  LABORATORY DATA: Lab Results  Component Value Date   WBC 3.5 (L) 02/24/2019   HGB 9.9 (  L) 02/24/2019   HCT 29.6 (L) 02/24/2019   MCV 103.5 (H) 02/24/2019   PLT 72 (L) 02/24/2019      Chemistry      Component Value Date/Time   NA 140 02/24/2019 0807   NA 141 09/08/2017 1009   K 4.2 02/24/2019 0807   K 3.5 09/08/2017 1009   CL 105 02/24/2019 0807   CO2 27 02/24/2019 0807   CO2 29 09/08/2017 1009   BUN 15 02/24/2019 0807   BUN 5.8 (L) 09/08/2017 1009   CREATININE 0.70 02/24/2019 0807   CREATININE 0.7 09/08/2017 1009      Component Value Date/Time   CALCIUM 8.0 (L) 02/24/2019 0807   CALCIUM 9.0 09/08/2017 1009   ALKPHOS 60 02/24/2019 0807   ALKPHOS 49 09/08/2017 1009   AST 10 (L) 02/24/2019 0807   AST 9 09/08/2017 1009   ALT 10 02/24/2019 0807   ALT <6 09/08/2017 1009   BILITOT 0.5 02/24/2019 0807   BILITOT 0.51 09/08/2017 1009       RADIOGRAPHIC STUDIES:  No results found.   ASSESSMENT/PLAN:  This is a very pleasant 63 year old Caucasian female who was initially diagnosed with limited stage small cell lung cancer.  She is status post 6 cycles of systemic chemotherapy with carboplatin and etoposide with concurrent radiation. She also is status post prophylactic cranial irradiation.  She had been on observation for 1  year but started to develop increased fatigue, weakness, and headache.  Further evaluation showed disease recurrence and she was started on treatment with carboplatin, etoposide, and Tecentriq with maintenance Tecentriq.  She is showed signs of disease progression and is currently being treated with systemic chemotherapy with cisplatin and irinotecab on days 1 and 8.  She is tolerating treatment fair but experiences delayed nausea and diarrhea following treatment.  Her dose was reduced to 25 mg/m of Cisplatin and 50 mg/m  of irinotecan on days 1 and 8.  She is status post 4 cycles.  She pause treatment for several weeks due to significant fatigue,  pancytopenia, and electrolyte balances. She resumed treatment on 02/03/2019.   The patient was seen with Dr. Julien Nordmann today.  Labs were reviewed with the patient.  Her platelet count was 72,000 today. Dr. Julien Nordmann recommends holding treatment for 1 week with close monitoring of her labs. Her labs also demonstrated hypomagnesemia today with her magnesium at 1.1 today. She was advised to double her dose of magnesium for 1 week until we recheck her labs. If her magnesium continues to be low next week, we will consider administering magnesium sulfate IV at her next infusion visit.   I will arrange for restaging CT to be performed prior to her next office visit in 4 weeks. The patient lives in [place] and requested her scans be performed at [facility].    We will see the patient back at that time for evaluation and to review her scan results prior to starting cycle #6.   Transportation services were arranged for the patient today.   The patient was advised to call immediately if she has any concerning symptoms in the interval. The patient voices understanding of current disease status and treatment options and is in agreement with the current care plan. All questions were answered. The patient knows to call the clinic with any problems, questions or  concerns. We can certainly see the patient much sooner if necessary  Orders Placed This Encounter  Procedures  . CT Abdomen Pelvis W Wo Contrast  This exam should ONLY be ordered for initial diagnosis or follow up of known pancreatic/liver/renal/bladder masses.    Standing Status:   Future    Standing Expiration Date:   05/26/2020    Order Specific Question:   ** REASON FOR EXAM (FREE TEXT)    Answer:   Restaging Lung Cancer    Order Specific Question:   If indicated for the ordered procedure, I authorize the administration of contrast media per Radiology protocol    Answer:   Yes    Order Specific Question:   Preferred imaging location?    Answer:   [facility]    Order Specific Question:   Is Oral Contrast requested for this exam?    Answer:   Yes, Per Radiology protocol    Order Specific Question:   Radiology Contrast Protocol - do NOT remove file path    Answer:   \\[email or web address]  . CT Chest W Contrast    Standing Status:   Future    Standing Expiration Date:   02/24/2020    Order Specific Question:   ** REASON FOR EXAM (FREE TEXT)    Answer:   Restaging CT scan    Order Specific Question:   If indicated for the ordered procedure, I authorize the administration of contrast media per Radiology protocol    Answer:   Yes    Order Specific Question:   Preferred imaging location?    Answer:   [facility]    Order Specific Question:   Radiology Contrast Protocol - do NOT remove file path    Answer:   \\[email or web address]     Cassandra L Heilingoetter, PA-C 02/24/19  ADDENDUM: Hematology/Oncology Attending: I had a face-to-face encounter with the patient today.  I recommended her care plan.  This is a very pleasant 63 years old white female with recurrent small cell lung cancer and she is currently on treatment with reduced dose cisplatin and irinotecan.  She is status post 4 cycles and has been tolerating this  treatment well. She presented today for evaluation before starting cycle #5.  Unfortunately her platelets count were low. I recommended for the patient to delay her treatment until next week. We will see her back for follow-up visit in 4 weeks for evaluation with repeat CT scan of the chest, abdomen and pelvis for restaging of her disease. She was advised to call immediately if she has any concerning symptoms in the interval.  Disclaimer: This note was dictated with voice recognition software. Similar sounding words can inadvertently be transcribed and may be missed upon review. Eilleen Kempf, MD 02/24/19

## 2019-02-28 DIAGNOSIS — R0902 Hypoxemia: Secondary | ICD-10-CM | POA: Diagnosis not present

## 2019-02-28 DIAGNOSIS — J449 Chronic obstructive pulmonary disease, unspecified: Secondary | ICD-10-CM | POA: Diagnosis not present

## 2019-03-03 ENCOUNTER — Inpatient Hospital Stay: Payer: Medicare Other

## 2019-03-03 ENCOUNTER — Other Ambulatory Visit: Payer: Self-pay | Admitting: Internal Medicine

## 2019-03-03 ENCOUNTER — Inpatient Hospital Stay: Payer: Medicare Other | Attending: Internal Medicine

## 2019-03-03 ENCOUNTER — Other Ambulatory Visit: Payer: Self-pay

## 2019-03-03 VITALS — BP 113/80 | HR 76 | Temp 98.1°F | Resp 18 | Wt 156.2 lb

## 2019-03-03 DIAGNOSIS — Z5111 Encounter for antineoplastic chemotherapy: Secondary | ICD-10-CM | POA: Insufficient documentation

## 2019-03-03 DIAGNOSIS — Z79899 Other long term (current) drug therapy: Secondary | ICD-10-CM | POA: Diagnosis not present

## 2019-03-03 DIAGNOSIS — R110 Nausea: Secondary | ICD-10-CM | POA: Diagnosis not present

## 2019-03-03 DIAGNOSIS — I10 Essential (primary) hypertension: Secondary | ICD-10-CM | POA: Insufficient documentation

## 2019-03-03 DIAGNOSIS — R112 Nausea with vomiting, unspecified: Secondary | ICD-10-CM

## 2019-03-03 DIAGNOSIS — C3492 Malignant neoplasm of unspecified part of left bronchus or lung: Secondary | ICD-10-CM | POA: Diagnosis present

## 2019-03-03 DIAGNOSIS — C787 Secondary malignant neoplasm of liver and intrahepatic bile duct: Secondary | ICD-10-CM | POA: Diagnosis not present

## 2019-03-03 DIAGNOSIS — C7951 Secondary malignant neoplasm of bone: Secondary | ICD-10-CM | POA: Diagnosis not present

## 2019-03-03 DIAGNOSIS — C3412 Malignant neoplasm of upper lobe, left bronchus or lung: Secondary | ICD-10-CM

## 2019-03-03 DIAGNOSIS — J449 Chronic obstructive pulmonary disease, unspecified: Secondary | ICD-10-CM | POA: Insufficient documentation

## 2019-03-03 DIAGNOSIS — R197 Diarrhea, unspecified: Secondary | ICD-10-CM | POA: Insufficient documentation

## 2019-03-03 DIAGNOSIS — E860 Dehydration: Secondary | ICD-10-CM

## 2019-03-03 LAB — CBC WITH DIFFERENTIAL (CANCER CENTER ONLY)
Abs Immature Granulocytes: 0.02 10*3/uL (ref 0.00–0.07)
Basophils Absolute: 0 10*3/uL (ref 0.0–0.1)
Basophils Relative: 0 %
Eosinophils Absolute: 0.2 10*3/uL (ref 0.0–0.5)
Eosinophils Relative: 7 %
HCT: 30.8 % — ABNORMAL LOW (ref 36.0–46.0)
Hemoglobin: 10.3 g/dL — ABNORMAL LOW (ref 12.0–15.0)
Immature Granulocytes: 1 %
Lymphocytes Relative: 38 %
Lymphs Abs: 1.3 10*3/uL (ref 0.7–4.0)
MCH: 34.9 pg — ABNORMAL HIGH (ref 26.0–34.0)
MCHC: 33.4 g/dL (ref 30.0–36.0)
MCV: 104.4 fL — ABNORMAL HIGH (ref 80.0–100.0)
Monocytes Absolute: 0.4 10*3/uL (ref 0.1–1.0)
Monocytes Relative: 10 %
Neutro Abs: 1.6 10*3/uL — ABNORMAL LOW (ref 1.7–7.7)
Neutrophils Relative %: 44 %
Platelet Count: 89 10*3/uL — ABNORMAL LOW (ref 150–400)
RBC: 2.95 MIL/uL — ABNORMAL LOW (ref 3.87–5.11)
RDW: 19.2 % — ABNORMAL HIGH (ref 11.5–15.5)
WBC Count: 3.5 10*3/uL — ABNORMAL LOW (ref 4.0–10.5)
nRBC: 0 % (ref 0.0–0.2)

## 2019-03-03 LAB — CMP (CANCER CENTER ONLY)
ALT: 8 U/L (ref 0–44)
AST: 11 U/L — ABNORMAL LOW (ref 15–41)
Albumin: 3.5 g/dL (ref 3.5–5.0)
Alkaline Phosphatase: 54 U/L (ref 38–126)
Anion gap: 11 (ref 5–15)
BUN: 9 mg/dL (ref 8–23)
CO2: 26 mmol/L (ref 22–32)
Calcium: 8.8 mg/dL — ABNORMAL LOW (ref 8.9–10.3)
Chloride: 104 mmol/L (ref 98–111)
Creatinine: 0.74 mg/dL (ref 0.44–1.00)
GFR, Est AFR Am: >60 mL/min (ref >60)
GFR, Estimated: >60 mL/min (ref >60)
Glucose, Bld: 90 mg/dL (ref 70–99)
Potassium: 3.9 mmol/L (ref 3.5–5.1)
Sodium: 141 mmol/L (ref 135–145)
Total Bilirubin: 0.5 mg/dL (ref 0.3–1.2)
Total Protein: 6.2 g/dL — ABNORMAL LOW (ref 6.5–8.1)

## 2019-03-03 LAB — MAGNESIUM: Magnesium: 1.4 mg/dL — CL (ref 1.7–2.4)

## 2019-03-03 MED ORDER — PALONOSETRON HCL INJECTION 0.25 MG/5ML
INTRAVENOUS | Status: AC
  Filled 2019-03-03: qty 5

## 2019-03-03 MED ORDER — SODIUM CHLORIDE 0.9 % IV SOLN
Freq: Once | INTRAVENOUS | Status: AC
  Administered 2019-03-03: 09:00:00 via INTRAVENOUS
  Filled 2019-03-03: qty 250

## 2019-03-03 MED ORDER — HEPARIN SOD (PORK) LOCK FLUSH 100 UNIT/ML IV SOLN
500.0000 [IU] | Freq: Once | INTRAVENOUS | Status: AC | PRN
  Administered 2019-03-03: 500 [IU]
  Filled 2019-03-03: qty 5

## 2019-03-03 MED ORDER — PALONOSETRON HCL INJECTION 0.25 MG/5ML
0.2500 mg | Freq: Once | INTRAVENOUS | Status: AC
  Administered 2019-03-03: 0.25 mg via INTRAVENOUS

## 2019-03-03 MED ORDER — POTASSIUM CHLORIDE 2 MEQ/ML IV SOLN
Freq: Once | INTRAVENOUS | Status: AC
  Administered 2019-03-03: 10:00:00 via INTRAVENOUS
  Filled 2019-03-03: qty 10

## 2019-03-03 MED ORDER — SODIUM CHLORIDE 0.9% FLUSH
10.0000 mL | INTRAVENOUS | Status: DC | PRN
  Administered 2019-03-03: 10 mL
  Filled 2019-03-03: qty 10

## 2019-03-03 MED ORDER — SODIUM CHLORIDE 0.9 % IV SOLN
25.0000 mg/m2 | Freq: Once | INTRAVENOUS | Status: AC
  Administered 2019-03-03: 45 mg via INTRAVENOUS
  Filled 2019-03-03: qty 45

## 2019-03-03 MED ORDER — SODIUM CHLORIDE 0.9 % IV SOLN
Freq: Once | INTRAVENOUS | Status: AC
  Administered 2019-03-03: 12:00:00 via INTRAVENOUS
  Filled 2019-03-03: qty 5

## 2019-03-03 MED ORDER — ATROPINE SULFATE 1 MG/ML IJ SOLN
INTRAMUSCULAR | Status: AC
  Filled 2019-03-03: qty 1

## 2019-03-03 MED ORDER — MAGNESIUM SULFATE 2 GM/50ML IV SOLN
2.0000 g | Freq: Once | INTRAVENOUS | Status: AC
  Administered 2019-03-03: 2 g via INTRAVENOUS
  Filled 2019-03-03: qty 50

## 2019-03-03 MED ORDER — ATROPINE SULFATE 1 MG/ML IJ SOLN
0.5000 mg | Freq: Once | INTRAMUSCULAR | Status: AC | PRN
  Administered 2019-03-03: 0.5 mg via INTRAVENOUS

## 2019-03-03 MED ORDER — IRINOTECAN HCL CHEMO INJECTION 100 MG/5ML
50.0000 mg/m2 | Freq: Once | INTRAVENOUS | Status: AC
  Administered 2019-03-03: 13:00:00 80 mg via INTRAVENOUS
  Filled 2019-03-03: qty 4

## 2019-03-03 NOTE — Patient Instructions (Signed)
Coronavirus (COVID-19) Are you at risk?  Are you at risk for the Coronavirus (COVID-19)?  To be considered HIGH RISK for Coronavirus (COVID-19), you have to meet the following criteria:  . Traveled to [place], [place], [place], [place] or [place]; or in the [place] to [place], [place], [place], or [place]; and have fever, cough, and shortness of breath within the last 2 weeks of travel OR . Been in close contact with a person diagnosed with COVID-19 within the last 2 weeks and have fever, cough, and shortness of breath . IF YOU DO NOT MEET THESE CRITERIA, YOU ARE CONSIDERED LOW RISK FOR COVID-19.  What to do if you are HIGH RISK for COVID-19?  . If you are having a medical emergency, call 911. . Seek medical care right away. Before you go to a doctor's office, urgent care or emergency department, call ahead and tell them about your recent travel, contact with someone diagnosed with COVID-19, and your symptoms. You should receive instructions from your physician's office regarding next steps of care.  . When you arrive at healthcare provider, tell the healthcare staff immediately you have returned from visiting [place], [place], [place], [place] or [place]; or traveled in the [place] to [place], [place], [place], or [place]; in the last two weeks or you have been in close contact with a person diagnosed with COVID-19 in the last 2 weeks.   . Tell the health care staff about your symptoms: fever, cough and shortness of breath. . After you have been seen by a medical provider, you will be either: o Tested for (COVID-19) and discharged home on quarantine except to seek medical care if symptoms worsen, and asked to  - Stay home and avoid contact with others until you get your results (4-5 days)  - Avoid travel on public transportation if possible (such as bus, train, or airplane) or o Sent to the Emergency Department by EMS for evaluation, COVID-19 testing, and possible  admission depending on your condition and test results.  What to do if you are LOW RISK for COVID-19?  Reduce your risk of any infection by using the same precautions used for avoiding the common cold or flu:  . Wash your hands often with soap and warm water for at least 20 seconds.  If soap and water are not readily available, use an alcohol-based hand sanitizer with at least 60% alcohol.  . If coughing or sneezing, cover your mouth and nose by coughing or sneezing into the elbow areas of your shirt or coat, into a tissue or into your sleeve (not your hands). . Avoid shaking hands with others and consider head nods or verbal greetings only. . Avoid touching your eyes, nose, or mouth with unwashed hands.  . Avoid close contact with people who are sick. . Avoid places or events with large numbers of people in one location, like concerts or sporting events. . Carefully consider travel plans you have or are making. . If you are planning any travel outside or inside the [place], visit the CDC's Travelers' Health webpage for the latest health notices. . If you have some symptoms but not all symptoms, continue to monitor at home and seek medical attention if your symptoms worsen. . If you are having a medical emergency, call 911.   ADDITIONAL HEALTHCARE OPTIONS FOR PATIENTS  [facility] / e-Visit: [email or web address]         [facility]: [phone]  [place]   Urgent Care: [facility]: [place] Discharge Instructions for Patients Receiving Chemotherapy  Today you received the following chemotherapy agents Irinotecan (CAMPTOSAR) & Cisplatin (PLATINOL).  To help prevent nausea and vomiting after your treatment, we encourage you to take your nausea medication as prescribed.   If you develop nausea and vomiting that is not controlled by your nausea  medication, call the clinic.   BELOW ARE SYMPTOMS THAT SHOULD BE REPORTED IMMEDIATELY:  *FEVER GREATER THAN 100.5 F  *CHILLS WITH OR WITHOUT FEVER  NAUSEA AND VOMITING THAT IS NOT CONTROLLED WITH YOUR NAUSEA MEDICATION  *UNUSUAL SHORTNESS OF BREATH  *UNUSUAL BRUISING OR BLEEDING  TENDERNESS IN MOUTH AND THROAT WITH OR WITHOUT PRESENCE OF ULCERS  *URINARY PROBLEMS  *BOWEL PROBLEMS  UNUSUAL RASH Items with * indicate a potential emergency and should be followed up as soon as possible.  Feel free to call the clinic should you have any questions or concerns. The clinic phone number is [phone].  Please show the [facility] at check-in to the Emergency Department and triage nurse.

## 2019-03-03 NOTE — Progress Notes (Signed)
Per Dr. Julien Nordmann, ok to treat with platelet count of 89.   Per Dr. Julien Nordmann, ok to run post hydration fluids with cisplatin.

## 2019-03-10 ENCOUNTER — Other Ambulatory Visit: Payer: Self-pay | Admitting: Internal Medicine

## 2019-03-10 DIAGNOSIS — R197 Diarrhea, unspecified: Secondary | ICD-10-CM

## 2019-03-16 ENCOUNTER — Inpatient Hospital Stay: Payer: Medicare Other

## 2019-03-16 ENCOUNTER — Other Ambulatory Visit: Payer: Self-pay

## 2019-03-16 ENCOUNTER — Encounter: Payer: Self-pay | Admitting: Internal Medicine

## 2019-03-16 ENCOUNTER — Encounter: Payer: Self-pay | Admitting: Pharmacist

## 2019-03-16 ENCOUNTER — Inpatient Hospital Stay (HOSPITAL_BASED_OUTPATIENT_CLINIC_OR_DEPARTMENT_OTHER): Payer: Medicare Other | Admitting: Internal Medicine

## 2019-03-16 VITALS — BP 118/64 | HR 69 | Temp 97.8°F | Resp 18 | Ht 64.0 in | Wt 158.9 lb

## 2019-03-16 DIAGNOSIS — Z5111 Encounter for antineoplastic chemotherapy: Secondary | ICD-10-CM

## 2019-03-16 DIAGNOSIS — R197 Diarrhea, unspecified: Secondary | ICD-10-CM

## 2019-03-16 DIAGNOSIS — C7951 Secondary malignant neoplasm of bone: Secondary | ICD-10-CM

## 2019-03-16 DIAGNOSIS — R112 Nausea with vomiting, unspecified: Secondary | ICD-10-CM

## 2019-03-16 DIAGNOSIS — T451X5A Adverse effect of antineoplastic and immunosuppressive drugs, initial encounter: Secondary | ICD-10-CM

## 2019-03-16 DIAGNOSIS — C3492 Malignant neoplasm of unspecified part of left bronchus or lung: Secondary | ICD-10-CM | POA: Diagnosis not present

## 2019-03-16 DIAGNOSIS — C3412 Malignant neoplasm of upper lobe, left bronchus or lung: Secondary | ICD-10-CM

## 2019-03-16 DIAGNOSIS — E860 Dehydration: Secondary | ICD-10-CM

## 2019-03-16 DIAGNOSIS — Z79899 Other long term (current) drug therapy: Secondary | ICD-10-CM

## 2019-03-16 DIAGNOSIS — C787 Secondary malignant neoplasm of liver and intrahepatic bile duct: Secondary | ICD-10-CM | POA: Diagnosis not present

## 2019-03-16 DIAGNOSIS — D6481 Anemia due to antineoplastic chemotherapy: Secondary | ICD-10-CM

## 2019-03-16 DIAGNOSIS — R110 Nausea: Secondary | ICD-10-CM | POA: Diagnosis not present

## 2019-03-16 DIAGNOSIS — D696 Thrombocytopenia, unspecified: Secondary | ICD-10-CM

## 2019-03-16 LAB — CBC WITH DIFFERENTIAL (CANCER CENTER ONLY)
Abs Immature Granulocytes: 0.03 10*3/uL (ref 0.00–0.07)
Basophils Absolute: 0 10*3/uL (ref 0.0–0.1)
Basophils Relative: 0 %
Eosinophils Absolute: 0.1 10*3/uL (ref 0.0–0.5)
Eosinophils Relative: 2 %
HCT: 29.1 % — ABNORMAL LOW (ref 36.0–46.0)
Hemoglobin: 9.6 g/dL — ABNORMAL LOW (ref 12.0–15.0)
Immature Granulocytes: 1 %
Lymphocytes Relative: 37 %
Lymphs Abs: 1.6 10*3/uL (ref 0.7–4.0)
MCH: 35.3 pg — ABNORMAL HIGH (ref 26.0–34.0)
MCHC: 33 g/dL (ref 30.0–36.0)
MCV: 107 fL — ABNORMAL HIGH (ref 80.0–100.0)
Monocytes Absolute: 0.5 10*3/uL (ref 0.1–1.0)
Monocytes Relative: 11 %
Neutro Abs: 2.2 10*3/uL (ref 1.7–7.7)
Neutrophils Relative %: 49 %
Platelet Count: 93 10*3/uL — ABNORMAL LOW (ref 150–400)
RBC: 2.72 MIL/uL — ABNORMAL LOW (ref 3.87–5.11)
RDW: 18.6 % — ABNORMAL HIGH (ref 11.5–15.5)
WBC Count: 4.4 10*3/uL (ref 4.0–10.5)
nRBC: 0 % (ref 0.0–0.2)

## 2019-03-16 LAB — CMP (CANCER CENTER ONLY)
ALT: 6 U/L (ref 0–44)
AST: 10 U/L — ABNORMAL LOW (ref 15–41)
Albumin: 3.4 g/dL — ABNORMAL LOW (ref 3.5–5.0)
Alkaline Phosphatase: 62 U/L (ref 38–126)
Anion gap: 12 (ref 5–15)
BUN: 12 mg/dL (ref 8–23)
CO2: 24 mmol/L (ref 22–32)
Calcium: 8.3 mg/dL — ABNORMAL LOW (ref 8.9–10.3)
Chloride: 104 mmol/L (ref 98–111)
Creatinine: 0.79 mg/dL (ref 0.44–1.00)
GFR, Est AFR Am: >60 mL/min (ref >60)
GFR, Estimated: >60 mL/min (ref >60)
Glucose, Bld: 94 mg/dL (ref 70–99)
Potassium: 3.9 mmol/L (ref 3.5–5.1)
Sodium: 140 mmol/L (ref 135–145)
Total Bilirubin: 0.3 mg/dL (ref 0.3–1.2)
Total Protein: 6.1 g/dL — ABNORMAL LOW (ref 6.5–8.1)

## 2019-03-16 LAB — MAGNESIUM: Magnesium: 1.6 mg/dL — ABNORMAL LOW (ref 1.7–2.4)

## 2019-03-16 MED ORDER — HEPARIN SOD (PORK) LOCK FLUSH 100 UNIT/ML IV SOLN
500.0000 [IU] | Freq: Once | INTRAVENOUS | Status: AC | PRN
  Administered 2019-03-16: 500 [IU]
  Filled 2019-03-16: qty 5

## 2019-03-16 MED ORDER — SODIUM CHLORIDE 0.9% FLUSH
10.0000 mL | INTRAVENOUS | Status: AC | PRN
  Administered 2019-03-16: 10 mL
  Filled 2019-03-16: qty 10

## 2019-03-16 MED ORDER — SODIUM CHLORIDE 0.9% FLUSH
10.0000 mL | INTRAVENOUS | Status: DC | PRN
  Administered 2019-03-16: 10 mL
  Filled 2019-03-16: qty 10

## 2019-03-16 NOTE — Progress Notes (Signed)
[place] [phone]   [phone]  OFFICE PROGRESS NOTE  [employer], MD [address] [place] [address]  DIAGNOSIS: Metastatic small cell lung cancer initially diagnosed as Limited stage (T1b, N2, M0) small cell lung cancer presented with left lower lobe/infrahilar mass and large mediastinal lymphadenopathy diagnosed in October 2017.  PRIOR THERAPY: 1) Systemic chemotherapy with cisplatin 60 MG/M2 on day 1 and etoposide 120 MG/M2 on days 1, 2 and 3 status post 1 cycle. This was discontinued secondary to intolerance. 2) Systemic chemotherapy with carboplatin for AUC of 4 on day 1 and etoposide 100 MG/M2 on days 1, 2 and 3 with Neulasta support on day 4 every 3 weeks. Status post 5 cycles. This was concurrent with radiation in [place], [place]. 3) prophylactic cranial irradiation. 4) Systemic chemotherapy with carboplatin for AUC of 5 on day 1, etoposide 100 mg/M2 on days 1, 2 and 3 as well as a Tecentriq (Atezolizumab) 1200 mg IV every 3 weeks with Neulasta support.  First dose February 17, 2018.  Status post 6 cycles of partial response. 5) Maintenance treatment with immunotherapy with Tecentriq 1200 mg IV every 3 weeks.  First dose June 30, 2018.  Status post 6 cycles.  Last dose was given October 13, 2018 discontinued secondary to disease progression.   CURRENT THERAPY: Systemic chemotherapy with cisplatin 30 mg/M2 and irinotecan 65 mg/M2 on days 1 and 8 every 3 weeks.  First dose November 10, 2018.  Starting cycle #2 her dose of cisplatin was reduced to 25 mg/M2 and irinotecan 50 mg/M2 on days 1 and 8 every 3 weeks.  Status post 5 cycles.   INTERVAL HISTORY: Rebekah Henderson 63 y.o. female returns to the clinic today for follow-up visit.  The patient is feeling fine today with no concerning complaints except for intermittent nausea and few episodes of diarrhea.  Her diarrhea improved with Imodium and Lomotil.  She denied having any  current chest pain, shortness of breath, cough or hemoptysis.  She denied having any fever or chills.  She has no recent weight loss or night sweats.  She is here today for evaluation and repeat blood work.   MEDICAL HISTORY: Past Medical History:  Diagnosis Date   Antineoplastic chemotherapy induced anemia 12/03/2016   Anxiety    takes Prozac daily   Arthritis    Benign fundic gland polyps of stomach    Colon polyps    COPD (chronic obstructive pulmonary disease) ([place])    Dehydration 03/06/2017   Diverticulitis    Dyspnea    with exertion   Encounter for antineoplastic chemotherapy 12/03/2016   Fibromyalgia    GERD (gastroesophageal reflux disease)    takes Pantoprazole daily   Hypertension    takes Metoprolol,Triamterene-HCTZ,and Amlodipine daily   Hypothyroidism    takes Synthroid daily   IBS (irritable bowel syndrome)    lung ca dx'd 10/02/2016   skin, lung   PONV (postoperative nausea and vomiting)    pt also states that she had some difficulty breathing after cervical fusion    ALLERGIES:  is allergic to penicillins; codeine; fentanyl; ranitidine hcl; keflex [cephalexin]; and lyrica [pregabalin].  MEDICATIONS:  Current Outpatient Medications  Medication Sig Dispense Refill   albuterol (PROVENTIL HFA;VENTOLIN HFA) 108 (90 Base) MCG/ACT inhaler Inhale 2 puffs into the lungs every 4 (four) hours as needed for wheezing or shortness of breath.     ALPRAZolam (XANAX) 0.25 MG tablet Take 2 tablets (0.5 mg total)  by mouth at bedtime as needed for anxiety. 30 tablet 0   amLODipine (NORVASC) 5 MG tablet Take 5 mg by mouth daily.       cyclobenzaprine (FLEXERIL) 10 MG tablet Take 10 mg by mouth once as needed for muscle spasms.     cyclobenzaprine (FLEXERIL) 5 MG tablet TAKE ONE TABLET 3 TIMES A DAY AS NEEDED. 30 tablet 0   dicyclomine (BENTYL) 20 MG tablet Take 20 mg by mouth 3 (three) times daily as needed for spasms.     diphenoxylate-atropine (LOMOTIL)  2.5-0.025 MG tablet TAKE 1 TABLET BY MOUTH 4 TIMES DAILY AS NEEDED FOR LOOSE STOOLS. 30 tablet 0   estazolam (PROSOM) 2 MG tablet Take 2 mg by mouth at bedtime.     estradiol (ESTRACE) 2 MG tablet Take 2 mg by mouth daily.       FLUoxetine (PROZAC) 40 MG capsule Take 40 mg by mouth daily.   2   HYDROcodone-acetaminophen (NORCO/VICODIN) 5-325 MG tablet Take 1 tablet by mouth every 4 (four) hours as needed for moderate pain.   0   levothyroxine (SYNTHROID, LEVOTHROID) 50 MCG tablet Take 50 mcg by mouth daily before breakfast.      lidocaine-prilocaine (EMLA) cream Apply 1 application topically as needed. To numb skin over port a cath: Squeeze a  small amount on cotton ball and place over port site 1-2 hours prior to chemotherapy. 30 g 0   loperamide (IMODIUM) 2 MG capsule Take 2 mg by mouth every 2 (two) hours as needed for diarrhea or loose stools.     magnesium oxide (MAG-OX) 400 (241.3 Mg) MG tablet Take 1 tablet (400 mg total) by mouth 2 (two) times daily. 60 tablet 1   metoprolol succinate (TOPROL-XL) 25 MG 24 hr tablet Take 25 mg by mouth daily.     nitrofurantoin, macrocrystal-monohydrate, (MACROBID) 100 MG capsule Take 1 capsule (100 mg total) by mouth 2 (two) times daily. 10 capsule 0   ondansetron (ZOFRAN-ODT) 4 MG disintegrating tablet Dissolve one tablet by mouth every 8 hours as needed for nausea and vomiting, 20 tablet 1   pantoprazole (PROTONIX) 40 MG tablet Take 40 mg by mouth 2 (two) times daily before a meal.   10   potassium chloride SA (K-DUR,KLOR-CON) 20 MEQ tablet Take 20 mEq by mouth daily as needed (for supplement).     promethazine (PHENERGAN) 25 MG tablet TAKE ONE TABLET BY MOUTH EVERY 6 HOURS AS NEEDED. (Patient taking differently: Take 25 mg by mouth every 6 (six) hours as needed for nausea or vomiting. ) 30 tablet 0   senna-docusate (SENOKOT-S) 8.6-50 MG tablet Take 1 tablet by mouth daily as needed for mild constipation or moderate constipation.      No  current facility-administered medications for this visit.     SURGICAL HISTORY:  Past Surgical History:  Procedure Laterality Date   BIOPSY N/A 05/25/2013   Procedure: BIOPSIES (Random Colon; Duodenal; Gastric);  Surgeon: Danie Binder, MD;  Location: [facility];  Service: Endoscopy;  Laterality: N/A;   BLADDER SUSPENSION     BREAST ENHANCEMENT SURGERY     BREAST IMPLANT REMOVAL     CERVICAL FUSION  AUG 2013   CHOLECYSTECTOMY  1999   COLONOSCOPY  2007 [place]   POLYPS   COLONOSCOPY WITH PROPOFOL N/A 05/25/2013   Procedure: COLONOSCOPY WITH PROPOFOL(at cecum 0957) total withdrawal time=63min);  Surgeon: Danie Binder, MD;  Location: [facility];  Service: Endoscopy;  Laterality: N/A;   ESOPHAGOGASTRODUODENOSCOPY (EGD) WITH PROPOFOL  N/A 05/25/2013   Procedure: ESOPHAGOGASTRODUODENOSCOPY (EGD) WITH PROPOFOL;  Surgeon: Danie Binder, MD;  Location: [facility];  Service: Endoscopy;  Laterality: N/A;   FOOT SURGERY     IR FLUORO GUIDE PORT INSERTION RIGHT  03/04/2018   IR US GUIDE VASC ACCESS RIGHT  03/04/2018   POLYPECTOMY N/A 05/25/2013   Procedure: POLYPECTOMY (Rectal and Gastric);  Surgeon: Danie Binder, MD;  Location: [facility];  Service: Endoscopy;  Laterality: N/A;   TONSILLECTOMY     UPPER GASTROINTESTINAL ENDOSCOPY     VIDEO BRONCHOSCOPY WITH ENDOBRONCHIAL ULTRASOUND  09/12/2016   Procedure: VIDEO BRONCHOSCOPY WITH ENDOBRONCHIAL ULTRASOUND AND BIOPSY;  Surgeon: Juanito Doom, MD;  Location: [facility];  Service: Cardiopulmonary;;    REVIEW OF SYSTEMS:  A comprehensive review of systems was negative except for: Constitutional: positive for fatigue Gastrointestinal: positive for diarrhea and nausea   PHYSICAL EXAMINATION: General appearance: alert, cooperative, fatigued and no distress Head: Normocephalic, without obvious abnormality, atraumatic Neck: no adenopathy, no JVD, supple, symmetrical, trachea midline and thyroid not enlarged, symmetric, no tenderness/mass/nodules Lymph  nodes: Cervical, supraclavicular, and axillary nodes normal. Resp: clear to auscultation bilaterally Back: symmetric, no curvature. ROM normal. No CVA tenderness. Cardio: regular rate and rhythm, S1, S2 normal, no murmur, click, rub or gallop GI: soft, non-tender; bowel sounds normal; no masses,  no organomegaly Extremities: extremities normal, atraumatic, no cyanosis or edema   ECOG PERFORMANCE STATUS: 1 - Symptomatic but completely ambulatory  Blood pressure 118/64, pulse 69, temperature 97.8 F (36.6 C), temperature source Oral, resp. rate 18, height 5\' 4"  (1.626 m), weight 158 lb 14.4 oz (72.1 kg), SpO2 93 %.  LABORATORY DATA: Lab Results  Component Value Date   WBC 4.4 03/16/2019   HGB 9.6 (L) 03/16/2019   HCT 29.1 (L) 03/16/2019   MCV 107.0 (H) 03/16/2019   PLT 93 (L) 03/16/2019      Chemistry      Component Value Date/Time   NA 141 03/03/2019 0809   NA 141 09/08/2017 1009   K 3.9 03/03/2019 0809   K 3.5 09/08/2017 1009   CL 104 03/03/2019 0809   CO2 26 03/03/2019 0809   CO2 29 09/08/2017 1009   BUN 9 03/03/2019 0809   BUN 5.8 (L) 09/08/2017 1009   CREATININE 0.74 03/03/2019 0809   CREATININE 0.7 09/08/2017 1009      Component Value Date/Time   CALCIUM 8.8 (L) 03/03/2019 0809   CALCIUM 9.0 09/08/2017 1009   ALKPHOS 54 03/03/2019 0809   ALKPHOS 49 09/08/2017 1009   AST 11 (L) 03/03/2019 0809   AST 9 09/08/2017 1009   ALT 8 03/03/2019 0809   ALT <6 09/08/2017 1009   BILITOT 0.5 03/03/2019 0809   BILITOT 0.51 09/08/2017 1009       RADIOGRAPHIC STUDIES: No results found.  ASSESSMENT AND PLAN:  This is a very pleasant 63 years old white female was limited stage small cell lung cancer, status post 6 cycles of systemic chemotherapy with carboplatin and etoposide concurrent with radiation and followed by prophylactic cranial irradiation. Patient has been in observation for more than a year.  She has been doing fine but recently started complaining of increasing  fatigue and weakness as well as headache. The patient had evidence for disease recurrence and she was a started on treatment with systemic chemotherapy with carboplatin, etoposide and Tecentriq.  She is status post 6 cycles.  The patient had no evidence for disease progression after the induction phase of her treatment.  She was started on maintenance treatment with Tecentriq Huey Bienenstock) status post 6 cycles.  She has been tolerating this treatment well.   She had evidence for disease progression and the patient was started on systemic chemotherapy with cisplatin and irinotecan on days 1 and 8.  She is tolerating her treatment much better after her dose was reduced to cisplatin 25 mg/M2 and irinotecan 50 mg/M2 on days 1 and 8.  She is status post 5 cycles. The patient continues to tolerate this treatment except for mild nausea and intermittent diarrhea. I recommended for her to start cycle #6 next week.  I will see her back for follow-up visit in 4 weeks for evaluation after repeating CT scan of the chest, abdomen and pelvis for restaging of her disease. The patient was advised to call immediately if she has any concerning symptoms in the interval. The patient voices understanding of current disease status and treatment options and is in agreement with the current care plan. All questions were answered. The patient knows to call the clinic with any problems, questions or concerns. We can certainly see the patient much sooner if necessary.   Disclaimer: This note was dictated with voice recognition software. Similar sounding words can inadvertently be transcribed and may not be corrected upon review.

## 2019-03-16 NOTE — Addendum Note (Signed)
Addended by: Ardeen Garland on: 03/16/2019 10:04 AM   Modules accepted: Orders

## 2019-03-19 ENCOUNTER — Other Ambulatory Visit: Payer: Self-pay | Admitting: Physician Assistant

## 2019-03-23 ENCOUNTER — Inpatient Hospital Stay: Payer: Medicare Other

## 2019-03-26 ENCOUNTER — Telehealth: Payer: Self-pay | Admitting: Internal Medicine

## 2019-03-26 ENCOUNTER — Telehealth: Payer: Self-pay | Admitting: Medical Oncology

## 2019-03-26 NOTE — Telephone Encounter (Signed)
Scheduled appt per 4/24 sch message - unable to reach patient. Unable to leave message - message sent to RN

## 2019-03-26 NOTE — Telephone Encounter (Signed)
Does not feel well -w/pain for past 3 days, a little bit dizzy ,but walked  to mailbox today . She sounds very weak on the phone, states her mouth and throat are dry.  Chemo 4/1 -cisplatin /irnotecan-IVF 4/14 -Pain in lower to mid back pain ,Stomach -Right lower abdomen,  Took vicodin and it helps for an hour.  Denies fever, nausea/vomiting or diarrhea. I instructed pt to go to ED-have someone drive her or call EMS. I also sent schedule request for appt Monday.

## 2019-03-27 ENCOUNTER — Telehealth: Payer: Self-pay | Admitting: Medical Oncology

## 2019-03-27 NOTE — Telephone Encounter (Signed)
She feels a little better today . She will call Monday with update on how she is feeling .

## 2019-03-27 NOTE — Telephone Encounter (Signed)
She is going to check on [place] and call me back.

## 2019-03-29 ENCOUNTER — Other Ambulatory Visit: Payer: Self-pay

## 2019-03-29 ENCOUNTER — Inpatient Hospital Stay: Payer: Medicare Other

## 2019-03-29 ENCOUNTER — Encounter: Payer: Self-pay | Admitting: Internal Medicine

## 2019-03-29 ENCOUNTER — Inpatient Hospital Stay (HOSPITAL_BASED_OUTPATIENT_CLINIC_OR_DEPARTMENT_OTHER): Payer: Medicare Other | Admitting: Internal Medicine

## 2019-03-29 ENCOUNTER — Telehealth: Payer: Self-pay | Admitting: Medical Oncology

## 2019-03-29 VITALS — BP 119/75 | HR 78 | Temp 97.9°F | Resp 18 | Ht 64.0 in | Wt 152.3 lb

## 2019-03-29 DIAGNOSIS — R112 Nausea with vomiting, unspecified: Secondary | ICD-10-CM

## 2019-03-29 DIAGNOSIS — K5732 Diverticulitis of large intestine without perforation or abscess without bleeding: Secondary | ICD-10-CM | POA: Diagnosis not present

## 2019-03-29 DIAGNOSIS — D6481 Anemia due to antineoplastic chemotherapy: Secondary | ICD-10-CM | POA: Diagnosis not present

## 2019-03-29 DIAGNOSIS — Z5111 Encounter for antineoplastic chemotherapy: Secondary | ICD-10-CM

## 2019-03-29 DIAGNOSIS — C3492 Malignant neoplasm of unspecified part of left bronchus or lung: Secondary | ICD-10-CM | POA: Diagnosis not present

## 2019-03-29 DIAGNOSIS — K529 Noninfective gastroenteritis and colitis, unspecified: Secondary | ICD-10-CM

## 2019-03-29 DIAGNOSIS — J449 Chronic obstructive pulmonary disease, unspecified: Secondary | ICD-10-CM

## 2019-03-29 DIAGNOSIS — E860 Dehydration: Secondary | ICD-10-CM

## 2019-03-29 DIAGNOSIS — C3432 Malignant neoplasm of lower lobe, left bronchus or lung: Secondary | ICD-10-CM | POA: Diagnosis not present

## 2019-03-29 DIAGNOSIS — C3412 Malignant neoplasm of upper lobe, left bronchus or lung: Secondary | ICD-10-CM

## 2019-03-29 DIAGNOSIS — T451X5A Adverse effect of antineoplastic and immunosuppressive drugs, initial encounter: Secondary | ICD-10-CM

## 2019-03-29 LAB — CMP (CANCER CENTER ONLY)
ALT: 7 U/L (ref 0–44)
AST: 10 U/L — ABNORMAL LOW (ref 15–41)
Albumin: 3.5 g/dL (ref 3.5–5.0)
Alkaline Phosphatase: 57 U/L (ref 38–126)
Anion gap: 11 (ref 5–15)
BUN: 13 mg/dL (ref 8–23)
CO2: 25 mmol/L (ref 22–32)
Calcium: 8.8 mg/dL — ABNORMAL LOW (ref 8.9–10.3)
Chloride: 104 mmol/L (ref 98–111)
Creatinine: 0.85 mg/dL (ref 0.44–1.00)
GFR, Est AFR Am: >60 mL/min (ref >60)
GFR, Estimated: >60 mL/min (ref >60)
Glucose, Bld: 94 mg/dL (ref 70–99)
Potassium: 3.9 mmol/L (ref 3.5–5.1)
Sodium: 140 mmol/L (ref 135–145)
Total Bilirubin: 0.6 mg/dL (ref 0.3–1.2)
Total Protein: 6.6 g/dL (ref 6.5–8.1)

## 2019-03-29 LAB — CBC WITH DIFFERENTIAL (CANCER CENTER ONLY)
Abs Immature Granulocytes: 0.02 10*3/uL (ref 0.00–0.07)
Basophils Absolute: 0 10*3/uL (ref 0.0–0.1)
Basophils Relative: 0 %
Eosinophils Absolute: 0.1 10*3/uL (ref 0.0–0.5)
Eosinophils Relative: 2 %
HCT: 28.8 % — ABNORMAL LOW (ref 36.0–46.0)
Hemoglobin: 9.8 g/dL — ABNORMAL LOW (ref 12.0–15.0)
Immature Granulocytes: 1 %
Lymphocytes Relative: 28 %
Lymphs Abs: 1.1 10*3/uL (ref 0.7–4.0)
MCH: 35.4 pg — ABNORMAL HIGH (ref 26.0–34.0)
MCHC: 34 g/dL (ref 30.0–36.0)
MCV: 104 fL — ABNORMAL HIGH (ref 80.0–100.0)
Monocytes Absolute: 0.5 10*3/uL (ref 0.1–1.0)
Monocytes Relative: 14 %
Neutro Abs: 2.2 10*3/uL (ref 1.7–7.7)
Neutrophils Relative %: 55 %
Platelet Count: 136 10*3/uL — ABNORMAL LOW (ref 150–400)
RBC: 2.77 MIL/uL — ABNORMAL LOW (ref 3.87–5.11)
RDW: 16.8 % — ABNORMAL HIGH (ref 11.5–15.5)
WBC Count: 4 10*3/uL (ref 4.0–10.5)
nRBC: 0 % (ref 0.0–0.2)

## 2019-03-29 LAB — MAGNESIUM: Magnesium: 1.6 mg/dL — ABNORMAL LOW (ref 1.7–2.4)

## 2019-03-29 MED ORDER — HEPARIN SOD (PORK) LOCK FLUSH 100 UNIT/ML IV SOLN
500.0000 [IU] | Freq: Once | INTRAVENOUS | Status: AC | PRN
  Administered 2019-03-29: 500 [IU]
  Filled 2019-03-29: qty 5

## 2019-03-29 MED ORDER — SODIUM CHLORIDE 0.9% FLUSH
10.0000 mL | INTRAVENOUS | Status: DC | PRN
  Administered 2019-03-29: 10 mL
  Filled 2019-03-29: qty 10

## 2019-03-29 NOTE — Telephone Encounter (Signed)
Confirmed appt for today.

## 2019-03-29 NOTE — Progress Notes (Signed)
[facility] [phone]   [phone]  OFFICE PROGRESS NOTE  [employer], MD [address] [place] [address]  DIAGNOSIS: Metastatic small cell lung cancer initially diagnosed as Limited stage (T1b, N2, M0) small cell lung cancer presented with left lower lobe/infrahilar mass and large mediastinal lymphadenopathy diagnosed in October 2017.  PRIOR THERAPY: 1) Systemic chemotherapy with cisplatin 60 MG/M2 on day 1 and etoposide 120 MG/M2 on days 1, 2 and 3 status post 1 cycle. This was discontinued secondary to intolerance. 2) Systemic chemotherapy with carboplatin for AUC of 4 on day 1 and etoposide 100 MG/M2 on days 1, 2 and 3 with Neulasta support on day 4 every 3 weeks. Status post 5 cycles. This was concurrent with radiation in [place], [place]. 3) prophylactic cranial irradiation. 4) Systemic chemotherapy with carboplatin for AUC of 5 on day 1, etoposide 100 mg/M2 on days 1, 2 and 3 as well as a Tecentriq (Atezolizumab) 1200 mg IV every 3 weeks with Neulasta support.  First dose February 17, 2018.  Status post 6 cycles of partial response. 5) Maintenance treatment with immunotherapy with Tecentriq 1200 mg IV every 3 weeks.  First dose June 30, 2018.  Status post 6 cycles.  Last dose was given October 13, 2018 discontinued secondary to disease progression.   CURRENT THERAPY: Systemic chemotherapy with cisplatin 30 mg/M2 and irinotecan 65 mg/M2 on days 1 and 8 every 3 weeks.  First dose November 10, 2018.  Starting cycle #2 her dose of cisplatin was reduced to 25 mg/M2 and irinotecan 50 mg/M2 on days 1 and 8 every 3 weeks.  Status post 5 cycles.   INTERVAL HISTORY: Rebekah Henderson 63 y.o. female returns to the clinic today for follow-up..  The patient is feeling fine today except for intermittent pain in the right lower quadrant.  She was treated recently for diverticulitis.  The patient denied having any current chest pain, shortness of  breath, cough or hemoptysis.  She denied having any fever or chills.  She has no nausea, vomiting, diarrhea or constipation.  She has no headache or visual changes.  Her treatment was delayed from last week to this week because she was unable to come to the visit.  She is here today for evaluation before resuming her treatment.  MEDICAL HISTORY: Past Medical History:  Diagnosis Date  . 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 Anxiety    takes Prozac daily  . Arthritis   . Benign fundic gland polyps of stomach   . Colon polyps   . COPD (chronic obstructive pulmonary disease) ([facility])   . Dehydration 03/06/2017  . Diverticulitis   . Dyspnea    with exertion  . Encounter for antineoplastic chemotherapy 12/03/2016  . Fibromyalgia   . GERD (gastroesophageal reflux disease)    takes Pantoprazole daily  . Hypertension    takes Metoprolol,Triamterene-HCTZ,and Amlodipine daily  . Hypothyroidism    takes Synthroid daily  . IBS (irritable bowel syndrome)   . lung ca dx'd 10/02/2016   skin, lung  . PONV (postoperative nausea and vomiting)    pt also states that she had some difficulty breathing after cervical fusion    ALLERGIES:  is allergic to penicillins; codeine; fentanyl; ranitidine hcl; keflex [cephalexin]; and lyrica [pregabalin].  MEDICATIONS:  Current Outpatient Medications  Medication Sig Dispense Refill  . albuterol (PROVENTIL HFA;VENTOLIN HFA) 108 (90 Base) MCG/ACT inhaler Inhale 2 puffs into the lungs every 4 (four) hours as needed  for wheezing or shortness of breath.    . ALPRAZolam (XANAX) 0.25 MG tablet Take 2 tablets (0.5 mg total) by mouth at bedtime as needed for anxiety. 30 tablet 0  . amLODipine (NORVASC) 5 MG tablet Take 5 mg by mouth daily.      . cyclobenzaprine (FLEXERIL) 10 MG tablet Take 10 mg by mouth once as needed for muscle spasms.    . cyclobenzaprine (FLEXERIL) 5 MG tablet TAKE ONE TABLET 3 TIMES A DAY AS NEEDED. 30 tablet 0  . dicyclomine (BENTYL)  20 MG tablet Take 20 mg by mouth 3 (three) times daily as needed for spasms.    . diphenoxylate-atropine (LOMOTIL) 2.5-0.025 MG tablet TAKE 1 TABLET BY MOUTH 4 TIMES DAILY AS NEEDED FOR LOOSE STOOLS. 30 tablet 0  . estazolam (PROSOM) 2 MG tablet Take 2 mg by mouth at bedtime.    Marland Kitchen estradiol (ESTRACE) 2 MG tablet Take 2 mg by mouth daily.      Marland Kitchen FLUoxetine (PROZAC) 40 MG capsule Take 40 mg by mouth daily.   2  . HYDROcodone-acetaminophen (NORCO/VICODIN) 5-325 MG tablet Take 1 tablet by mouth every 4 (four) hours as needed for moderate pain.   0  . levothyroxine (SYNTHROID, LEVOTHROID) 50 MCG tablet Take 50 mcg by mouth daily before breakfast.     . lidocaine-prilocaine (EMLA) cream Apply 1 application topically as needed. To numb skin over port a cath: Squeeze a  small amount on cotton ball and place over port site 1-2 hours prior to chemotherapy. 30 g 0  . loperamide (IMODIUM) 2 MG capsule Take 2 mg by mouth every 2 (two) hours as needed for diarrhea or loose stools.    . magnesium oxide (MAG-OX) 400 (241.3 Mg) MG tablet Take 1 tablet (400 mg total) by mouth 2 (two) times daily. 60 tablet 1  . metoprolol succinate (TOPROL-XL) 25 MG 24 hr tablet Take 25 mg by mouth daily.    . nitrofurantoin, macrocrystal-monohydrate, (MACROBID) 100 MG capsule Take 1 capsule (100 mg total) by mouth 2 (two) times daily. 10 capsule 0  . ondansetron (ZOFRAN-ODT) 4 MG disintegrating tablet Dissolve one tablet by mouth every 8 hours as needed for nausea and vomiting, 20 tablet 1  . pantoprazole (PROTONIX) 40 MG tablet Take 40 mg by mouth 2 (two) times daily before a meal.   10  . potassium chloride SA (K-DUR,KLOR-CON) 20 MEQ tablet Take 20 mEq by mouth daily as needed (for supplement).    . promethazine (PHENERGAN) 25 MG tablet TAKE ONE TABLET BY MOUTH EVERY 6 HOURS AS NEEDED. (Patient taking differently: Take 25 mg by mouth every 6 (six) hours as needed for nausea or vomiting. ) 30 tablet 0  . senna-docusate (SENOKOT-S)  8.6-50 MG tablet Take 1 tablet by mouth daily as needed for mild constipation or moderate constipation.      No current facility-administered medications for this visit.    Facility-Administered Medications Ordered in Other Visits  Medication Dose Route Frequency Provider Last Rate Last Dose  . sodium chloride flush (NS) 0.9 % injection 10 mL  10 mL Intracatheter PRN Curt Bears, MD   10 mL at 03/16/19 1001    SURGICAL HISTORY:  Past Surgical History:  Procedure Laterality Date  . BIOPSY N/A 05/25/2013   Procedure: BIOPSIES (Random Colon; Duodenal; Gastric);  Surgeon: Danie Binder, MD;  Location: [facility];  Service: Endoscopy;  Laterality: N/A;  . BLADDER SUSPENSION    . BREAST ENHANCEMENT SURGERY    . BREAST  IMPLANT REMOVAL    . CERVICAL FUSION  AUG 2013  . CHOLECYSTECTOMY  1999  . COLONOSCOPY  2007 [place]   POLYPS  . COLONOSCOPY WITH PROPOFOL N/A 05/25/2013   Procedure: COLONOSCOPY WITH PROPOFOL(at cecum 0957) total withdrawal time=76min);  Surgeon: Danie Binder, MD;  Location: [facility];  Service: Endoscopy;  Laterality: N/A;  . ESOPHAGOGASTRODUODENOSCOPY (EGD) WITH PROPOFOL N/A 05/25/2013   Procedure: ESOPHAGOGASTRODUODENOSCOPY (EGD) WITH PROPOFOL;  Surgeon: Danie Binder, MD;  Location: [facility];  Service: Endoscopy;  Laterality: N/A;  . FOOT SURGERY    . IR FLUORO GUIDE PORT INSERTION RIGHT  03/04/2018  . IR US GUIDE VASC ACCESS RIGHT  03/04/2018  . POLYPECTOMY N/A 05/25/2013   Procedure: POLYPECTOMY (Rectal and Gastric);  Surgeon: Danie Binder, MD;  Location: [facility];  Service: Endoscopy;  Laterality: N/A;  . TONSILLECTOMY    . UPPER GASTROINTESTINAL ENDOSCOPY    . VIDEO BRONCHOSCOPY WITH ENDOBRONCHIAL ULTRASOUND  09/12/2016   Procedure: VIDEO BRONCHOSCOPY WITH ENDOBRONCHIAL ULTRASOUND AND BIOPSY;  Surgeon: Juanito Doom, MD;  Location: [facility];  Service: Cardiopulmonary;;    REVIEW OF SYSTEMS:  A comprehensive review of systems was negative except for: Constitutional:  positive for fatigue Gastrointestinal: positive for abdominal pain   PHYSICAL EXAMINATION: General appearance: alert, cooperative, fatigued and no distress Head: Normocephalic, without obvious abnormality, atraumatic Neck: no adenopathy, no JVD, supple, symmetrical, trachea midline and thyroid not enlarged, symmetric, no tenderness/mass/nodules Lymph nodes: Cervical, supraclavicular, and axillary nodes normal. Resp: clear to auscultation bilaterally Back: symmetric, no curvature. ROM normal. No CVA tenderness. Cardio: regular rate and rhythm, S1, S2 normal, no murmur, click, rub or gallop GI: soft, non-tender; bowel sounds normal; no masses,  no organomegaly Extremities: extremities normal, atraumatic, no cyanosis or edema   ECOG PERFORMANCE STATUS: 1 - Symptomatic but completely ambulatory  Blood pressure 119/75, pulse 78, temperature 97.9 F (36.6 C), temperature source Oral, resp. rate 18, height 5\' 4"  (1.626 m), weight 152 lb 4.8 oz (69.1 kg), SpO2 97 %.  LABORATORY DATA: Lab Results  Component Value Date   WBC 4.0 03/29/2019   HGB 9.8 (L) 03/29/2019   HCT 28.8 (L) 03/29/2019   MCV 104.0 (H) 03/29/2019   PLT 136 (L) 03/29/2019      Chemistry      Component Value Date/Time   NA 140 03/29/2019 1420   NA 141 09/08/2017 1009   K 3.9 03/29/2019 1420   K 3.5 09/08/2017 1009   CL 104 03/29/2019 1420   CO2 25 03/29/2019 1420   CO2 29 09/08/2017 1009   BUN 13 03/29/2019 1420   BUN 5.8 (L) 09/08/2017 1009   CREATININE 0.85 03/29/2019 1420   CREATININE 0.7 09/08/2017 1009      Component Value Date/Time   CALCIUM 8.8 (L) 03/29/2019 1420   CALCIUM 9.0 09/08/2017 1009   ALKPHOS 57 03/29/2019 1420   ALKPHOS 49 09/08/2017 1009   AST 10 (L) 03/29/2019 1420   AST 9 09/08/2017 1009   ALT 7 03/29/2019 1420   ALT <6 09/08/2017 1009   BILITOT 0.6 03/29/2019 1420   BILITOT 0.51 09/08/2017 1009       RADIOGRAPHIC STUDIES: No results found.  ASSESSMENT AND PLAN:  This is a  very pleasant 63 years old white female was limited stage small cell lung cancer, status post 6 cycles of systemic chemotherapy with carboplatin and etoposide concurrent with radiation and followed by prophylactic cranial irradiation. Patient has been in observation for more than a year.  She has been doing fine but recently started complaining of increasing fatigue and weakness as well as headache. The patient had evidence for disease recurrence and she was a started on treatment with systemic chemotherapy with carboplatin, etoposide and Tecentriq.  She is status post 6 cycles.  The patient had no evidence for disease progression after the induction phase of her treatment.  She was started on maintenance treatment with Tecentriq Huey Bienenstock) status post 6 cycles.  She has been tolerating this treatment well.   She had evidence for disease progression and the patient was started on systemic chemotherapy with cisplatin and irinotecan on days 1 and 8.  She is tolerating her treatment much better after her dose was reduced to cisplatin 25 mg/M2 and irinotecan 50 mg/M2 on days 1 and 8.  She is status post 5 cycles. The patient has been tolerating this treatment well except for fatigue and occasional nausea and diarrhea. I recommended for her to proceed with cycle #6 on 03/31/2019.  We will delay her treatment by 1 week. She is scheduled to have repeat CT scan of the chest, abdomen and pelvis on 04/09/2019. I will see the patient back for follow-up visit in 3 weeks for evaluation before starting cycle #7. Her abdominal pain is very dull with no guarding or rebound and likely concerning for appendicitis.  It could be secondary to her previous colitis. The patient was advised to call immediately if she has any concerning symptoms in the interval. The patient voices understanding of current disease status and treatment options and is in agreement with the current care plan. All questions were answered. The  patient knows to call the clinic with any problems, questions or concerns. We can certainly see the patient much sooner if necessary. Disclaimer: This note was dictated with voice recognition software. Similar sounding words can inadvertently be transcribed and may not be corrected upon review.

## 2019-03-31 ENCOUNTER — Other Ambulatory Visit: Payer: Self-pay | Admitting: Medical

## 2019-03-31 ENCOUNTER — Inpatient Hospital Stay: Payer: Medicare Other

## 2019-03-31 ENCOUNTER — Other Ambulatory Visit: Payer: Self-pay

## 2019-03-31 VITALS — BP 119/55 | HR 67 | Temp 97.9°F | Resp 18

## 2019-03-31 DIAGNOSIS — C3412 Malignant neoplasm of upper lobe, left bronchus or lung: Secondary | ICD-10-CM

## 2019-03-31 DIAGNOSIS — C3492 Malignant neoplasm of unspecified part of left bronchus or lung: Secondary | ICD-10-CM

## 2019-03-31 DIAGNOSIS — R112 Nausea with vomiting, unspecified: Secondary | ICD-10-CM

## 2019-03-31 DIAGNOSIS — E860 Dehydration: Secondary | ICD-10-CM

## 2019-03-31 LAB — CBC WITH DIFFERENTIAL (CANCER CENTER ONLY)
Abs Immature Granulocytes: 0.03 10*3/uL (ref 0.00–0.07)
Basophils Absolute: 0 10*3/uL (ref 0.0–0.1)
Basophils Relative: 0 %
Eosinophils Absolute: 0.1 10*3/uL (ref 0.0–0.5)
Eosinophils Relative: 3 %
HCT: 28 % — ABNORMAL LOW (ref 36.0–46.0)
Hemoglobin: 9.3 g/dL — ABNORMAL LOW (ref 12.0–15.0)
Immature Granulocytes: 1 %
Lymphocytes Relative: 35 %
Lymphs Abs: 1.4 10*3/uL (ref 0.7–4.0)
MCH: 35.2 pg — ABNORMAL HIGH (ref 26.0–34.0)
MCHC: 33.2 g/dL (ref 30.0–36.0)
MCV: 106.1 fL — ABNORMAL HIGH (ref 80.0–100.0)
Monocytes Absolute: 0.5 10*3/uL (ref 0.1–1.0)
Monocytes Relative: 12 %
Neutro Abs: 1.9 10*3/uL (ref 1.7–7.7)
Neutrophils Relative %: 49 %
Platelet Count: 135 10*3/uL — ABNORMAL LOW (ref 150–400)
RBC: 2.64 MIL/uL — ABNORMAL LOW (ref 3.87–5.11)
RDW: 16.7 % — ABNORMAL HIGH (ref 11.5–15.5)
WBC Count: 3.9 10*3/uL — ABNORMAL LOW (ref 4.0–10.5)
nRBC: 0 % (ref 0.0–0.2)

## 2019-03-31 LAB — CMP (CANCER CENTER ONLY)
ALT: 6 U/L (ref 0–44)
AST: 13 U/L — ABNORMAL LOW (ref 15–41)
Albumin: 3.4 g/dL — ABNORMAL LOW (ref 3.5–5.0)
Alkaline Phosphatase: 55 U/L (ref 38–126)
Anion gap: 9 (ref 5–15)
BUN: 13 mg/dL (ref 8–23)
CO2: 25 mmol/L (ref 22–32)
Calcium: 8.7 mg/dL — ABNORMAL LOW (ref 8.9–10.3)
Chloride: 107 mmol/L (ref 98–111)
Creatinine: 0.74 mg/dL (ref 0.44–1.00)
GFR, Est AFR Am: >60 mL/min (ref >60)
GFR, Estimated: >60 mL/min (ref >60)
Glucose, Bld: 85 mg/dL (ref 70–99)
Potassium: 4.3 mmol/L (ref 3.5–5.1)
Sodium: 141 mmol/L (ref 135–145)
Total Bilirubin: 0.4 mg/dL (ref 0.3–1.2)
Total Protein: 6.4 g/dL — ABNORMAL LOW (ref 6.5–8.1)

## 2019-03-31 LAB — MAGNESIUM: Magnesium: 1.6 mg/dL — ABNORMAL LOW (ref 1.7–2.4)

## 2019-03-31 MED ORDER — MAGNESIUM SULFATE 2 GM/50ML IV SOLN
2.0000 g | Freq: Once | INTRAVENOUS | Status: AC
  Administered 2019-03-31: 2 g via INTRAVENOUS
  Filled 2019-03-31: qty 50

## 2019-03-31 MED ORDER — ATROPINE SULFATE 1 MG/ML IJ SOLN
0.5000 mg | Freq: Once | INTRAMUSCULAR | Status: AC | PRN
  Administered 2019-03-31: 0.5 mg via INTRAVENOUS

## 2019-03-31 MED ORDER — SODIUM CHLORIDE 0.9% FLUSH
10.0000 mL | INTRAVENOUS | Status: DC | PRN
  Administered 2019-03-31: 10 mL
  Filled 2019-03-31: qty 10

## 2019-03-31 MED ORDER — POTASSIUM CHLORIDE 2 MEQ/ML IV SOLN
Freq: Once | INTRAVENOUS | Status: AC
  Administered 2019-03-31: 10:00:00 via INTRAVENOUS
  Filled 2019-03-31: qty 10

## 2019-03-31 MED ORDER — ATROPINE SULFATE 1 MG/ML IJ SOLN
INTRAMUSCULAR | Status: AC
  Filled 2019-03-31: qty 1

## 2019-03-31 MED ORDER — PALONOSETRON HCL INJECTION 0.25 MG/5ML
0.2500 mg | Freq: Once | INTRAVENOUS | Status: AC
  Administered 2019-03-31: 0.25 mg via INTRAVENOUS

## 2019-03-31 MED ORDER — PALONOSETRON HCL INJECTION 0.25 MG/5ML
INTRAVENOUS | Status: AC
  Filled 2019-03-31: qty 5

## 2019-03-31 MED ORDER — SODIUM CHLORIDE 0.9 % IV SOLN
25.0000 mg/m2 | Freq: Once | INTRAVENOUS | Status: AC
  Administered 2019-03-31: 45 mg via INTRAVENOUS
  Filled 2019-03-31: qty 45

## 2019-03-31 MED ORDER — SODIUM CHLORIDE 0.9 % IV SOLN
Freq: Once | INTRAVENOUS | Status: AC
  Administered 2019-03-31: 10:00:00 via INTRAVENOUS
  Filled 2019-03-31: qty 250

## 2019-03-31 MED ORDER — HEPARIN SOD (PORK) LOCK FLUSH 100 UNIT/ML IV SOLN
500.0000 [IU] | Freq: Once | INTRAVENOUS | Status: AC | PRN
  Administered 2019-03-31: 500 [IU]
  Filled 2019-03-31: qty 5

## 2019-03-31 MED ORDER — CYCLOBENZAPRINE HCL 5 MG PO TABS: ORAL_TABLET | ORAL | 0 refills | Status: DC

## 2019-03-31 MED ORDER — IRINOTECAN HCL CHEMO INJECTION 100 MG/5ML
50.0000 mg/m2 | Freq: Once | INTRAVENOUS | Status: AC
  Administered 2019-03-31: 80 mg via INTRAVENOUS
  Filled 2019-03-31: qty 4

## 2019-03-31 MED ORDER — SODIUM CHLORIDE 0.9 % IV SOLN
Freq: Once | INTRAVENOUS | Status: AC
  Administered 2019-03-31: 12:00:00 via INTRAVENOUS
  Filled 2019-03-31: qty 5

## 2019-03-31 NOTE — Patient Instructions (Signed)
Coronavirus (COVID-19) Are you at risk?  Are you at risk for the Coronavirus (COVID-19)?  To be considered HIGH RISK for Coronavirus (COVID-19), you have to meet the following criteria:  . Traveled to [place], [place], [place], [place] or [place]; or in the [place] to [place], [place], [place], or [place]; and have fever, cough, and shortness of breath within the last 2 weeks of travel OR . Been in close contact with a person diagnosed with COVID-19 within the last 2 weeks and have fever, cough, and shortness of breath . IF YOU DO NOT MEET THESE CRITERIA, YOU ARE CONSIDERED LOW RISK FOR COVID-19.  What to do if you are HIGH RISK for COVID-19?  . If you are having a medical emergency, call 911. . Seek medical care right away. Before you go to a doctor's office, urgent care or emergency department, call ahead and tell them about your recent travel, contact with someone diagnosed with COVID-19, and your symptoms. You should receive instructions from your physician's office regarding next steps of care.  . When you arrive at healthcare provider, tell the healthcare staff immediately you have returned from visiting [place], [place], [place], [place] or [place]; or traveled in the [place] to [place], [place], [place], or [place]; in the last two weeks or you have been in close contact with a person diagnosed with COVID-19 in the last 2 weeks.   . Tell the health care staff about your symptoms: fever, cough and shortness of breath. . After you have been seen by a medical provider, you will be either: o Tested for (COVID-19) and discharged home on quarantine except to seek medical care if symptoms worsen, and asked to  - Stay home and avoid contact with others until you get your results (4-5 days)  - Avoid travel on public transportation if possible (such as bus, train, or airplane) or o Sent to the Emergency Department by EMS for evaluation, COVID-19 testing, and possible  admission depending on your condition and test results.  What to do if you are LOW RISK for COVID-19?  Reduce your risk of any infection by using the same precautions used for avoiding the common cold or flu:  . Wash your hands often with soap and warm water for at least 20 seconds.  If soap and water are not readily available, use an alcohol-based hand sanitizer with at least 60% alcohol.  . If coughing or sneezing, cover your mouth and nose by coughing or sneezing into the elbow areas of your shirt or coat, into a tissue or into your sleeve (not your hands). . Avoid shaking hands with others and consider head nods or verbal greetings only. . Avoid touching your eyes, nose, or mouth with unwashed hands.  . Avoid close contact with people who are sick. . Avoid places or events with large numbers of people in one location, like concerts or sporting events. . Carefully consider travel plans you have or are making. . If you are planning any travel outside or inside the [place], visit the CDC's Travelers' Health webpage for the latest health notices. . If you have some symptoms but not all symptoms, continue to monitor at home and seek medical attention if your symptoms worsen. . If you are having a medical emergency, call 911.   ADDITIONAL HEALTHCARE OPTIONS FOR PATIENTS  [facility] / e-Visit: [email or web address]         [facility]: [phone]  Rebekah Henderson   Urgent Care: [facility]: [facility] Discharge Instructions for Patients Receiving Chemotherapy  Today you received the following chemotherapy agents Irinotecan (CAMPTOSAR) & Cisplatin (PLATINOL).  To help prevent nausea and vomiting after your treatment, we encourage you to take your nausea medication as prescribed.   If you develop nausea and vomiting that is not controlled by your nausea  medication, call the clinic.   BELOW ARE SYMPTOMS THAT SHOULD BE REPORTED IMMEDIATELY:  *FEVER GREATER THAN 100.5 F  *CHILLS WITH OR WITHOUT FEVER  NAUSEA AND VOMITING THAT IS NOT CONTROLLED WITH YOUR NAUSEA MEDICATION  *UNUSUAL SHORTNESS OF BREATH  *UNUSUAL BRUISING OR BLEEDING  TENDERNESS IN MOUTH AND THROAT WITH OR WITHOUT PRESENCE OF ULCERS  *URINARY PROBLEMS  *BOWEL PROBLEMS  UNUSUAL RASH Items with * indicate a potential emergency and should be followed up as soon as possible.  Feel free to call the clinic should you have any questions or concerns. The clinic phone number is [phone].  Please show the [facility] at check-in to the Emergency Department and triage nurse.

## 2019-04-03 ENCOUNTER — Other Ambulatory Visit: Payer: Self-pay

## 2019-04-03 ENCOUNTER — Encounter (HOSPITAL_COMMUNITY): Payer: Self-pay | Admitting: Emergency Medicine

## 2019-04-03 ENCOUNTER — Emergency Department (HOSPITAL_COMMUNITY): Payer: Medicare Other

## 2019-04-03 ENCOUNTER — Emergency Department (HOSPITAL_COMMUNITY)
Admission: EM | Admit: 2019-04-03 | Discharge: 2019-04-03 | Disposition: A | Discharge status: Home / Self Care | Payer: Medicare Other | Attending: Emergency Medicine | Admitting: Emergency Medicine

## 2019-04-03 DIAGNOSIS — Z79899 Other long term (current) drug therapy: Secondary | ICD-10-CM | POA: Diagnosis not present

## 2019-04-03 DIAGNOSIS — Z03818 Encounter for observation for suspected exposure to other biological agents ruled out: Secondary | ICD-10-CM | POA: Insufficient documentation

## 2019-04-03 DIAGNOSIS — E039 Hypothyroidism, unspecified: Secondary | ICD-10-CM | POA: Insufficient documentation

## 2019-04-03 DIAGNOSIS — I10 Essential (primary) hypertension: Secondary | ICD-10-CM | POA: Insufficient documentation

## 2019-04-03 DIAGNOSIS — C3490 Malignant neoplasm of unspecified part of unspecified bronchus or lung: Secondary | ICD-10-CM

## 2019-04-03 DIAGNOSIS — R531 Weakness: Secondary | ICD-10-CM | POA: Diagnosis not present

## 2019-04-03 DIAGNOSIS — Z87891 Personal history of nicotine dependence: Secondary | ICD-10-CM | POA: Insufficient documentation

## 2019-04-03 DIAGNOSIS — J449 Chronic obstructive pulmonary disease, unspecified: Secondary | ICD-10-CM | POA: Diagnosis not present

## 2019-04-03 DIAGNOSIS — C3412 Malignant neoplasm of upper lobe, left bronchus or lung: Secondary | ICD-10-CM | POA: Insufficient documentation

## 2019-04-03 LAB — URINALYSIS, ROUTINE W REFLEX MICROSCOPIC
Bilirubin Urine: NEGATIVE
Glucose, UA: NEGATIVE mg/dL
Hgb urine dipstick: NEGATIVE
Ketones, ur: NEGATIVE mg/dL
Leukocytes,Ua: NEGATIVE
Nitrite: NEGATIVE
Protein, ur: NEGATIVE mg/dL
Specific Gravity, Urine: 1.006 (ref 1.005–1.030)
pH: 8 (ref 5.0–8.0)

## 2019-04-03 LAB — CBC WITH DIFFERENTIAL/PLATELET
Abs Immature Granulocytes: 0.02 10*3/uL (ref 0.00–0.07)
Basophils Absolute: 0 10*3/uL (ref 0.0–0.1)
Basophils Relative: 0 %
Eosinophils Absolute: 0.1 10*3/uL (ref 0.0–0.5)
Eosinophils Relative: 2 %
HCT: 27.1 % — ABNORMAL LOW (ref 36.0–46.0)
Hemoglobin: 9 g/dL — ABNORMAL LOW (ref 12.0–15.0)
Immature Granulocytes: 0 %
Lymphocytes Relative: 20 %
Lymphs Abs: 1.2 10*3/uL (ref 0.7–4.0)
MCH: 35.3 pg — ABNORMAL HIGH (ref 26.0–34.0)
MCHC: 33.2 g/dL (ref 30.0–36.0)
MCV: 106.3 fL — ABNORMAL HIGH (ref 80.0–100.0)
Monocytes Absolute: 0.6 10*3/uL (ref 0.1–1.0)
Monocytes Relative: 10 %
Neutro Abs: 4.1 10*3/uL (ref 1.7–7.7)
Neutrophils Relative %: 68 %
Platelets: 133 10*3/uL — ABNORMAL LOW (ref 150–400)
RBC: 2.55 MIL/uL — ABNORMAL LOW (ref 3.87–5.11)
RDW: 15.9 % — ABNORMAL HIGH (ref 11.5–15.5)
WBC: 5.9 10*3/uL (ref 4.0–10.5)
nRBC: 0 % (ref 0.0–0.2)

## 2019-04-03 LAB — COMPREHENSIVE METABOLIC PANEL
ALT: 10 U/L (ref 0–44)
AST: 13 U/L — ABNORMAL LOW (ref 15–41)
Albumin: 3.4 g/dL — ABNORMAL LOW (ref 3.5–5.0)
Alkaline Phosphatase: 48 U/L (ref 38–126)
Anion gap: 9 (ref 5–15)
BUN: 13 mg/dL (ref 8–23)
CO2: 27 mmol/L (ref 22–32)
Calcium: 8.6 mg/dL — ABNORMAL LOW (ref 8.9–10.3)
Chloride: 100 mmol/L (ref 98–111)
Creatinine, Ser: 0.79 mg/dL (ref 0.44–1.00)
GFR calc Af Amer: >60 mL/min (ref >60)
GFR calc non Af Amer: >60 mL/min (ref >60)
Glucose, Bld: 92 mg/dL (ref 70–99)
Potassium: 3.4 mmol/L — ABNORMAL LOW (ref 3.5–5.1)
Sodium: 136 mmol/L (ref 135–145)
Total Bilirubin: 0.6 mg/dL (ref 0.3–1.2)
Total Protein: 6.1 g/dL — ABNORMAL LOW (ref 6.5–8.1)

## 2019-04-03 LAB — SARS CORONAVIRUS 2 BY RT PCR (HOSPITAL ORDER, PERFORMED IN ~~LOC~~ HOSPITAL LAB): SARS Coronavirus 2: NEGATIVE

## 2019-04-03 MED ORDER — SODIUM CHLORIDE 0.9 % IV BOLUS
1000.0000 mL | Freq: Once | INTRAVENOUS | Status: AC
  Administered 2019-04-03: 1000 mL via INTRAVENOUS

## 2019-04-03 MED ORDER — HEPARIN SOD (PORK) LOCK FLUSH 100 UNIT/ML IV SOLN
500.0000 [IU] | Freq: Once | INTRAVENOUS | Status: AC
  Administered 2019-04-03: 500 [IU]
  Filled 2019-04-03: qty 5

## 2019-04-03 MED ORDER — HYDROMORPHONE HCL 1 MG/ML IJ SOLN
0.5000 mg | Freq: Once | INTRAMUSCULAR | Status: AC
  Administered 2019-04-03: 0.5 mg via INTRAVENOUS
  Filled 2019-04-03: qty 1

## 2019-04-03 NOTE — ED Triage Notes (Signed)
Patient brought in for generalized weakness. Patient reports symptoms began Friday night, had chemo tx on Thursday.

## 2019-04-03 NOTE — ED Provider Notes (Signed)
[facility] EMERGENCY DEPARTMENT Provider Note   CSN: [id number] Arrival date & time: 04/03/19  1451    History   Chief Complaint Chief Complaint  Patient presents with  . Weakness    HPI Rebekah Henderson is a 63 y.o. female.     Generalized weakness for the past 2 days after chemotherapy on Thursday.  No specific dysuria, cough, fever, sweats, chills, nausea, vomiting, diarrhea.  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  She states generalized pain.  Severity is mild to moderate.  Nothing makes sxs better or worse.     Past Medical History:  Diagnosis Date  . 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 Anxiety    takes Prozac daily  . Arthritis   . Benign fundic gland polyps of stomach   . Colon polyps   . COPD (chronic obstructive pulmonary disease) ([place])   . Dehydration 03/06/2017  . Diverticulitis   . Dyspnea    with exertion  . Encounter for antineoplastic chemotherapy 12/03/2016  . Fibromyalgia   . GERD (gastroesophageal reflux disease)    takes Pantoprazole daily  . Hypertension    takes Metoprolol,Triamterene-HCTZ,and Amlodipine daily  . Hypothyroidism    takes Synthroid daily  . IBS (irritable bowel syndrome)   . lung ca dx'd 10/02/2016   skin, lung  . PONV (postoperative nausea and vomiting)    pt also states that she had some difficulty breathing after cervical fusion    Patient Active Problem List   Diagnosis Date Noted  . Nausea without vomiting 02/24/2019  . UTI due to extended-spectrum beta lactamase (ESBL) producing Escherichia coli 01/08/2019  . Viral gastroenteritis 01/08/2019  . Gastroenteritis 01/06/2019  . Goals of care, counseling/discussion 06/30/2018  . Encounter for antineoplastic immunotherapy 03/10/2018  . 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 Acute on chronic respiratory failure with hypoxia ([place]) 03/21/2017  . Mild protein-calorie malnutrition ([place])  03/21/2017  . HCAP (healthcare-associated pneumonia) 03/20/2017  . Dehydration 03/06/2017  . Anemia 01/04/2017  . Thrombocytopenia ([place]) 01/04/2017  . Influenza A 12/10/2016  . SIRS (systemic inflammatory response syndrome) ([place]) 12/07/2016  . Hypoxia 12/07/2016  . Bronchitis 12/07/2016  . Encounter for antineoplastic chemotherapy 12/03/2016  . 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 Protein-calorie malnutrition, severe 10/30/2016  . Vomiting 10/29/2016  . Abdominal pain 10/29/2016  . Rash and nonspecific skin eruption 10/29/2016  . Neutropenic fever ([place]) 10/15/2016  . Intractable nausea and vomiting 10/15/2016  . Pancytopenia due to antineoplastic chemotherapy ([place]) 10/15/2016  . Nodular rash 10/15/2016  . Hypokalemia 10/15/2016  . Hypomagnesemia 10/15/2016  . Chronic respiratory failure with hypoxia ([place]) 10/15/2016  . Small cell lung cancer, left ([place]) 09/19/2016  . Mediastinal mass 09/10/2016  . COPD (chronic obstructive pulmonary disease) ([place])   . Hypertension   . Fibromyalgia   . IBS (irritable bowel syndrome)   . GERD (gastroesophageal reflux disease)   . Hypothyroidism   . Colon polyps   . Anxiety   . PONV (postoperative nausea and vomiting)   . Diarrhea 05/12/2013    Past Surgical History:  Procedure Laterality Date  . BIOPSY N/A 05/25/2013   Procedure: BIOPSIES (Random Colon; Duodenal; Gastric);  Surgeon: Danie Binder, MD;  Location: [facility] ORS;  Service: Endoscopy;  Laterality: N/A;  . BLADDER SUSPENSION    . BREAST ENHANCEMENT SURGERY    . BREAST IMPLANT REMOVAL    . CERVICAL FUSION  AUG 2013  .  CHOLECYSTECTOMY  1999  . COLONOSCOPY  2007 [place]   POLYPS  . COLONOSCOPY WITH PROPOFOL N/A 05/25/2013   Procedure: COLONOSCOPY WITH PROPOFOL(at cecum 0957) total withdrawal time=53min);  Surgeon: Danie Binder, MD;  Location: [facility];  Service: Endoscopy;  Laterality: N/A;  . ESOPHAGOGASTRODUODENOSCOPY (EGD) WITH PROPOFOL N/A 05/25/2013    Procedure: ESOPHAGOGASTRODUODENOSCOPY (EGD) WITH PROPOFOL;  Surgeon: Danie Binder, MD;  Location: [facility];  Service: Endoscopy;  Laterality: N/A;  . FOOT SURGERY    . IR FLUORO GUIDE PORT INSERTION RIGHT  03/04/2018  . IR US GUIDE VASC ACCESS RIGHT  03/04/2018  . POLYPECTOMY N/A 05/25/2013   Procedure: POLYPECTOMY (Rectal and Gastric);  Surgeon: Danie Binder, MD;  Location: [facility];  Service: Endoscopy;  Laterality: N/A;  . TONSILLECTOMY    . UPPER GASTROINTESTINAL ENDOSCOPY    . VIDEO BRONCHOSCOPY WITH ENDOBRONCHIAL ULTRASOUND  09/12/2016   Procedure: VIDEO BRONCHOSCOPY WITH ENDOBRONCHIAL ULTRASOUND AND BIOPSY;  Surgeon: Juanito Doom, MD;  Location: [facility] OR;  Service: Cardiopulmonary;;     OB History    Gravida  1   Para  1   Term  1   Preterm      AB      Living        SAB      TAB      Ectopic      Multiple      Live Births               Home Medications    Prior to Admission medications   Medication Sig Start Date End Date Taking? Authorizing Provider  albuterol (PROVENTIL HFA;VENTOLIN HFA) 108 (90 Base) MCG/ACT inhaler Inhale 2 puffs into the lungs every 4 (four) hours as needed for wheezing or shortness of breath.    [provider]  ALPRAZolam Duanne Moron) 0.25 MG tablet Take 2 tablets (0.5 mg total) by mouth at bedtime as needed for anxiety. 02/11/18   Curt Bears, MD  amLODipine (NORVASC) 5 MG tablet Take 5 mg by mouth daily.      [provider]  cyclobenzaprine (FLEXERIL) 5 MG tablet TAKE ONE TABLET 3 TIMES A DAY AS NEEDED. 03/31/19   Tanner, Lyndon Code., PA-C  dicyclomine (BENTYL) 20 MG tablet Take 20 mg by mouth 3 (three) times daily as needed for spasms.    [provider]  diphenoxylate-atropine (LOMOTIL) 2.5-0.025 MG tablet TAKE 1 TABLET BY MOUTH 4 TIMES DAILY AS NEEDED FOR LOOSE STOOLS. 03/10/19   Curt Bears, MD  estazolam (PROSOM) 2 MG tablet Take 2 mg by mouth at bedtime.    [provider]  estradiol (ESTRACE) 2  MG tablet Take 2 mg by mouth daily.      [provider]  FLUoxetine (PROZAC) 40 MG capsule Take 40 mg by mouth daily.     [provider]  HYDROcodone-acetaminophen (NORCO/VICODIN) 5-325 MG tablet Take 1 tablet by mouth every 4 (four) hours as needed for moderate pain.     [provider]  levothyroxine (SYNTHROID, LEVOTHROID) 50 MCG tablet Take 50 mcg by mouth daily before breakfast.     [provider]  lidocaine-prilocaine (EMLA) cream Apply 1 application topically as needed. To numb skin over port a cath: Squeeze a  small amount on cotton ball and place over port site 1-2 hours prior to chemotherapy. 02/18/18   Curt Bears, MD  loperamide (IMODIUM) 2 MG capsule Take 2 mg by mouth every 2 (two) hours as needed for diarrhea  or loose stools.    [provider]  magnesium oxide (MAG-OX) 400 (241.3 Mg) MG tablet Take 1 tablet (400 mg total) by mouth 2 (two) times daily. 11/12/18   Maryanna Shape, NP  metoprolol succinate (TOPROL-XL) 25 MG 24 hr tablet Take 25 mg by mouth daily.    [provider]  nitrofurantoin, macrocrystal-monohydrate, (MACROBID) 100 MG capsule Take 1 capsule (100 mg total) by mouth 2 (two) times daily. 01/08/19   Kathie Dike, MD  ondansetron (ZOFRAN-ODT) 4 MG disintegrating tablet Dissolve one tablet by mouth every 8 hours as needed for nausea and vomiting, 02/22/19   Curt Bears, MD  pantoprazole (PROTONIX) 40 MG tablet Take 40 mg by mouth 2 (two) times daily before a meal.     [provider]  potassium chloride SA (K-DUR,KLOR-CON) 20 MEQ tablet Take 20 mEq by mouth daily as needed (for supplement).    [provider]  promethazine (PHENERGAN) 25 MG tablet TAKE ONE TABLET BY MOUTH EVERY 6 HOURS AS NEEDED. Patient taking differently: Take 25 mg by mouth every 6 (six) hours as needed for nausea or vomiting.  12/18/18   Curt Bears, MD  senna-docusate (SENOKOT-S) 8.6-50 MG tablet Take 1 tablet  by mouth daily as needed for mild constipation or moderate constipation.  07/17/12   [provider]    Family History Family History  Problem Relation Age of Onset  . Breast cancer Mother   . Diabetes Maternal Grandfather   . Lung cancer Father   . Heart failure Sister 56       Died. Morbidly obese  . Colon cancer Neg Hx   . Colon polyps Neg Hx     Social History Social History   Tobacco Use  . Smoking status: Former Smoker    Packs/day: 2.00    Years: 20.00    Pack years: 40.00    Last attempt to quit: 05/20/2004    Years since quitting: 14.8  . Smokeless tobacco: Never Used  Substance Use Topics  . Alcohol use: No  . Drug use: No     Allergies   Penicillins; Codeine; Fentanyl; Ranitidine hcl; Keflex [cephalexin]; and Lyrica [pregabalin]   Review of Systems Review of Systems  All other systems reviewed and are negative.    Physical Exam Updated Vital Signs BP 107/69 (BP Location: Left Arm)   Pulse 88   Temp 98.3 F (36.8 C) (Oral)   Resp 14   Ht 5\' 4"  (1.626 m)   Wt 68.9 kg   SpO2 98%   BMI 26.09 kg/m   Physical Exam Vitals signs and nursing note reviewed.  Constitutional:      Appearance: She is well-developed.     Comments: nad  HENT:     Head: Normocephalic and atraumatic.  Eyes:     Conjunctiva/sclera: Conjunctivae normal.  Neck:     Musculoskeletal: Neck supple.  Cardiovascular:     Rate and Rhythm: Normal rate and regular rhythm.  Pulmonary:     Effort: Pulmonary effort is normal.     Breath sounds: Normal breath sounds.  Abdominal:     General: Bowel sounds are normal.     Palpations: Abdomen is soft.  Musculoskeletal: Normal range of motion.  Skin:    General: Skin is warm and dry.  Neurological:     Mental Status: She is alert and oriented to person, place, and time.  Psychiatric:        Behavior: Behavior normal.  ED Treatments / Results  Labs (all labs ordered are listed, but only abnormal results are  displayed) Labs Reviewed  CBC WITH DIFFERENTIAL/PLATELET - Abnormal; Notable for the following components:      Result Value   RBC 2.55 (*)    Hemoglobin 9.0 (*)    HCT 27.1 (*)    MCV 106.3 (*)    MCH 35.3 (*)    RDW 15.9 (*)    Platelets 133 (*)    All other components within normal limits  COMPREHENSIVE METABOLIC PANEL - Abnormal; Notable for the following components:   Potassium 3.4 (*)    Calcium 8.6 (*)    Total Protein 6.1 (*)    Albumin 3.4 (*)    AST 13 (*)    All other components within normal limits  URINALYSIS, ROUTINE W REFLEX MICROSCOPIC - Abnormal; Notable for the following components:   Color, Urine STRAW (*)    All other components within normal limits  SARS CORONAVIRUS 2 (HOSPITAL ORDER, [place] [facility])    EKG None  Radiology Dg Chest Port 1 View  Result Date: 04/03/2019 CLINICAL DATA:  Weakness.  History of lung cancer on chemotherapy. EXAM: PORTABLE CHEST 1 VIEW COMPARISON:  Chest radiographs 01/06/2019 and CT 01/08/2019 FINDINGS: The patient is mildly rotated to the left. A right jugular Port-A-Cath terminates over the lower SVC. The cardiomediastinal silhouette is unchanged with normal heart size. Scarring or atelectasis is again seen medially in the left upper and left lower lungs. No sizable pleural effusion or pneumothorax is identified. Prior cervical spine fusion is noted. IMPRESSION: No acute abnormality identified in the chest. Electronically Signed   By: Logan Bores M.D.   On: 04/03/2019 16:38    Procedures Procedures (including critical care time)  Medications Ordered in ED Medications  HYDROmorphone (DILAUDID) injection 0.5 mg (0.5 mg Intravenous Given 04/03/19 1550)  sodium chloride 0.9 % bolus 1,000 mL (1,000 mLs Intravenous New Bag/Given 04/03/19 1608)     Initial Impression / Assessment and Plan / ED Course  I have reviewed the triage vital signs and the nursing notes.  Pertinent labs & imaging results that were  available during my care of the patient were reviewed by me and considered in my medical decision making (see chart for details).        Patient reports generalized weakness after chemotherapy on Thursday.  She appears in no acute distress.  Screening labs, urinalysis, chest x-ray all negative for acute findings.  She feels better after 1 L of IV fluids.  Will discharge home.  Final Clinical Impressions(s) / ED Diagnoses   Final diagnoses:  Weakness  Small cell lung cancer [facility])    ED Discharge Orders    None       Nat Christen, MD 04/03/19 [facility]

## 2019-04-03 NOTE — Discharge Instructions (Addendum)
Blood work, urinalysis, chest x-ray all stable.  Try to increase your fluid intake.  Follow-up with your oncologist next week.

## 2019-04-03 NOTE — ED Notes (Signed)
Patient requesting something for back pain prior to discharge. EDP made aware, verbal order given. Patient friend for ride home.

## 2019-04-05 ENCOUNTER — Telehealth: Payer: Self-pay | Admitting: Medical Oncology

## 2019-04-05 NOTE — Telephone Encounter (Signed)
Left VM for pt to increase her fluids, take her Bentyl  if  she feels like she is having abd spasm. Rebekah Henderson will see her tomorrow and if pain persists and is unmanageable go to ED.

## 2019-04-05 NOTE — Addendum Note (Signed)
Addended by: Ardeen Garland on: 04/05/2019 02:47 PM   Modules accepted: Orders

## 2019-04-05 NOTE — Telephone Encounter (Signed)
Pt called back and stated she will take her Bentyl and pain medication.

## 2019-04-05 NOTE — Telephone Encounter (Addendum)
Pain Increase - left lateral /lower abd pain described as sharp. Intensity 7/10. She is nauseated, "Having more trouble breathing" Send in ED over the weekend, CXR -nothing acute, covid -19 negative. Cycle 6 day 8 tomorrow and CT scan Friday  Labs done

## 2019-04-06 ENCOUNTER — Telehealth: Payer: Self-pay | Admitting: Medical Oncology

## 2019-04-06 NOTE — Telephone Encounter (Signed)
LVM to return my call °

## 2019-04-07 ENCOUNTER — Inpatient Hospital Stay: Payer: Medicare Other | Attending: Internal Medicine

## 2019-04-07 ENCOUNTER — Inpatient Hospital Stay (HOSPITAL_BASED_OUTPATIENT_CLINIC_OR_DEPARTMENT_OTHER): Payer: Medicare Other | Admitting: Internal Medicine

## 2019-04-07 ENCOUNTER — Other Ambulatory Visit: Payer: Self-pay

## 2019-04-07 ENCOUNTER — Inpatient Hospital Stay: Payer: Medicare Other

## 2019-04-07 ENCOUNTER — Encounter: Payer: Self-pay | Admitting: Internal Medicine

## 2019-04-07 VITALS — BP 133/88 | HR 74 | Temp 97.8°F | Resp 18 | Ht 64.0 in | Wt 150.9 lb

## 2019-04-07 DIAGNOSIS — C787 Secondary malignant neoplasm of liver and intrahepatic bile duct: Secondary | ICD-10-CM | POA: Insufficient documentation

## 2019-04-07 DIAGNOSIS — Z5111 Encounter for antineoplastic chemotherapy: Secondary | ICD-10-CM | POA: Diagnosis not present

## 2019-04-07 DIAGNOSIS — E039 Hypothyroidism, unspecified: Secondary | ICD-10-CM | POA: Insufficient documentation

## 2019-04-07 DIAGNOSIS — Z79899 Other long term (current) drug therapy: Secondary | ICD-10-CM | POA: Insufficient documentation

## 2019-04-07 DIAGNOSIS — R112 Nausea with vomiting, unspecified: Secondary | ICD-10-CM

## 2019-04-07 DIAGNOSIS — J449 Chronic obstructive pulmonary disease, unspecified: Secondary | ICD-10-CM | POA: Insufficient documentation

## 2019-04-07 DIAGNOSIS — D61818 Other pancytopenia: Secondary | ICD-10-CM | POA: Diagnosis not present

## 2019-04-07 DIAGNOSIS — D6481 Anemia due to antineoplastic chemotherapy: Secondary | ICD-10-CM | POA: Diagnosis not present

## 2019-04-07 DIAGNOSIS — C7951 Secondary malignant neoplasm of bone: Secondary | ICD-10-CM | POA: Diagnosis not present

## 2019-04-07 DIAGNOSIS — I10 Essential (primary) hypertension: Secondary | ICD-10-CM

## 2019-04-07 DIAGNOSIS — T451X5A Adverse effect of antineoplastic and immunosuppressive drugs, initial encounter: Secondary | ICD-10-CM

## 2019-04-07 DIAGNOSIS — C3492 Malignant neoplasm of unspecified part of left bronchus or lung: Secondary | ICD-10-CM

## 2019-04-07 DIAGNOSIS — C3412 Malignant neoplasm of upper lobe, left bronchus or lung: Secondary | ICD-10-CM | POA: Diagnosis present

## 2019-04-07 DIAGNOSIS — E860 Dehydration: Secondary | ICD-10-CM

## 2019-04-07 LAB — MAGNESIUM: Magnesium: 1.3 mg/dL — CL (ref 1.7–2.4)

## 2019-04-07 MED ORDER — CIPROFLOXACIN HCL 500 MG PO TABS: 500.0000 mg | ORAL_TABLET | Freq: Two times a day (BID) | ORAL | 0 refills | Status: DC

## 2019-04-07 MED ORDER — ATROPINE SULFATE 1 MG/ML IJ SOLN
INTRAMUSCULAR | Status: AC
  Filled 2019-04-07: qty 1

## 2019-04-07 MED ORDER — POTASSIUM CHLORIDE 2 MEQ/ML IV SOLN
Freq: Once | INTRAVENOUS | Status: AC
  Administered 2019-04-07: 11:00:00 via INTRAVENOUS
  Filled 2019-04-07: qty 10

## 2019-04-07 MED ORDER — IRINOTECAN HCL CHEMO INJECTION 100 MG/5ML
50.0000 mg/m2 | Freq: Once | INTRAVENOUS | Status: AC
  Administered 2019-04-07: 14:00:00 80 mg via INTRAVENOUS
  Filled 2019-04-07: qty 4

## 2019-04-07 MED ORDER — SODIUM CHLORIDE 0.9 % IV SOLN
Freq: Once | INTRAVENOUS | Status: AC
  Administered 2019-04-07: 11:00:00 via INTRAVENOUS
  Filled 2019-04-07: qty 250

## 2019-04-07 MED ORDER — HEPARIN SOD (PORK) LOCK FLUSH 100 UNIT/ML IV SOLN
500.0000 [IU] | Freq: Once | INTRAVENOUS | Status: AC | PRN
  Administered 2019-04-07: 17:00:00 500 [IU]
  Filled 2019-04-07: qty 5

## 2019-04-07 MED ORDER — ATROPINE SULFATE 1 MG/ML IJ SOLN
0.5000 mg | Freq: Once | INTRAMUSCULAR | Status: AC | PRN
  Administered 2019-04-07: 13:00:00 0.5 mg via INTRAVENOUS

## 2019-04-07 MED ORDER — SODIUM CHLORIDE 0.9 % IV SOLN
Freq: Once | INTRAVENOUS | Status: AC
  Administered 2019-04-07: 13:00:00 via INTRAVENOUS
  Filled 2019-04-07: qty 5

## 2019-04-07 MED ORDER — SODIUM CHLORIDE 0.9% FLUSH
10.0000 mL | INTRAVENOUS | Status: DC | PRN
  Administered 2019-04-07: 10 mL
  Filled 2019-04-07: qty 10

## 2019-04-07 MED ORDER — SODIUM CHLORIDE 0.9 % IV SOLN
25.0000 mg/m2 | Freq: Once | INTRAVENOUS | Status: AC
  Administered 2019-04-07: 15:00:00 45 mg via INTRAVENOUS
  Filled 2019-04-07: qty 45

## 2019-04-07 MED ORDER — PALONOSETRON HCL INJECTION 0.25 MG/5ML
0.2500 mg | Freq: Once | INTRAVENOUS | Status: AC
  Administered 2019-04-07: 13:00:00 0.25 mg via INTRAVENOUS

## 2019-04-07 MED ORDER — PALONOSETRON HCL INJECTION 0.25 MG/5ML
INTRAVENOUS | Status: AC
  Filled 2019-04-07: qty 5

## 2019-04-07 MED ORDER — SODIUM CHLORIDE 0.9% FLUSH
10.0000 mL | INTRAVENOUS | Status: DC | PRN
  Administered 2019-04-07: 17:00:00 10 mL
  Filled 2019-04-07: qty 10

## 2019-04-07 NOTE — Progress Notes (Signed)
Add additional Magnesium 2g to hydration fluids today per Dr Julien Nordmann

## 2019-04-07 NOTE — Progress Notes (Signed)
Per Dr. Jana Hakim, [place] to use labs from 5/2 for today's treatment. Spoke to Abelina Bachelor, nurse for Dr. Julien Nordmann, who stated that she will enter note for OK to treat.

## 2019-04-07 NOTE — Patient Instructions (Signed)
[facility] Discharge Instructions for Patients Receiving Chemotherapy  Today you received the following chemotherapy agents: irenotecan, cisplatin  To help prevent nausea and vomiting after your treatment, we encourage you to take your nausea medication as directed.    If you develop nausea and vomiting that is not controlled by your nausea medication, call the clinic.   BELOW ARE SYMPTOMS THAT SHOULD BE REPORTED IMMEDIATELY:  *FEVER GREATER THAN 100.5 F  *CHILLS WITH OR WITHOUT FEVER  NAUSEA AND VOMITING THAT IS NOT CONTROLLED WITH YOUR NAUSEA MEDICATION  *UNUSUAL SHORTNESS OF BREATH  *UNUSUAL BRUISING OR BLEEDING  TENDERNESS IN MOUTH AND THROAT WITH OR WITHOUT PRESENCE OF ULCERS  *URINARY PROBLEMS  *BOWEL PROBLEMS  UNUSUAL RASH Items with * indicate a potential emergency and should be followed up as soon as possible.  Feel free to call the clinic should you have any questions or concerns. The clinic phone number is [phone].  Please show the [facility] at check-in to the Emergency Department and triage nurse.

## 2019-04-07 NOTE — Progress Notes (Signed)
Per Dr. Julien Nordmann, ok to proceed with chemotherapy with 157mL urine output. May also infuse post-tx hydration fluids concurrently with Cisplatin.

## 2019-04-07 NOTE — Progress Notes (Signed)
Per Dr Julien Nordmann it is okay to treat pt today with chemo and todays cmp

## 2019-04-07 NOTE — Progress Notes (Signed)
[place] [phone]   [phone]  OFFICE PROGRESS NOTE  Redmond School, MD [address] [place] [address]  DIAGNOSIS: Metastatic small cell lung cancer initially diagnosed as Limited stage (T1b, N2, M0) small cell lung cancer presented with left lower lobe/infrahilar mass and large mediastinal lymphadenopathy diagnosed in October 2017.  PRIOR THERAPY: 1) Systemic chemotherapy with cisplatin 60 MG/M2 on day 1 and etoposide 120 MG/M2 on days 1, 2 and 3 status post 1 cycle. This was discontinued secondary to intolerance. 2) Systemic chemotherapy with carboplatin for AUC of 4 on day 1 and etoposide 100 MG/M2 on days 1, 2 and 3 with Neulasta support on day 4 every 3 weeks. Status post 5 cycles. This was concurrent with radiation in [place], [place]. 3) prophylactic cranial irradiation. 4) Systemic chemotherapy with carboplatin for AUC of 5 on day 1, etoposide 100 mg/M2 on days 1, 2 and 3 as well as a Tecentriq (Atezolizumab) 1200 mg IV every 3 weeks with Neulasta support.  First dose February 17, 2018.  Status post 6 cycles of partial response. 5) Maintenance treatment with immunotherapy with Tecentriq 1200 mg IV every 3 weeks.  First dose June 30, 2018.  Status post 6 cycles.  Last dose was given October 13, 2018 discontinued secondary to disease progression.   CURRENT THERAPY: Systemic chemotherapy with cisplatin 30 mg/M2 and irinotecan 65 mg/M2 on days 1 and 8 every 3 weeks.  First dose November 10, 2018.  Starting cycle #2 her dose of cisplatin was reduced to 25 mg/M2 and irinotecan 50 mg/M2 on days 1 and 8 every 3 weeks.  Status post 5 cycles.   INTERVAL HISTORY: Rebekah Henderson 63 y.o. female returns to the clinic today for follow-up visit.  The patient is feeling fine today but she was complaining of left lower quadrant abdominal pain for the last few days.  She was seen in the emergency department.  She was tested for COVID-19 that was  negative.  She also had urinalysis that was unremarkable.  Chest x-ray showed no acute abnormalities identified in the chest.  The patient is feeling a little bit better today.  She took Bentyl as well as her pain medication and feeling a little bit better.  She denied having any current chest pain but has shortness of breath with exertion with no cough or hemoptysis.  She denied having any fever or chills.  He has no headache or visual changes.  She is here today for evaluation before starting.  MEDICAL HISTORY: Past Medical History:  Diagnosis Date  . 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 Anxiety    takes Prozac daily  . Arthritis   . Benign fundic gland polyps of stomach   . Colon polyps   . COPD (chronic obstructive pulmonary disease) ([place])   . Dehydration 03/06/2017  . Diverticulitis   . Dyspnea    with exertion  . Encounter for antineoplastic chemotherapy 12/03/2016  . Fibromyalgia   . GERD (gastroesophageal reflux disease)    takes Pantoprazole daily  . Hypertension    takes Metoprolol,Triamterene-HCTZ,and Amlodipine daily  . Hypothyroidism    takes Synthroid daily  . IBS (irritable bowel syndrome)   . lung ca dx'd 10/02/2016   skin, lung  . PONV (postoperative nausea and vomiting)    pt also states that she had some difficulty breathing after cervical fusion    ALLERGIES:  is allergic to penicillins; codeine; fentanyl; ranitidine hcl; keflex [cephalexin]; and  lyrica [pregabalin].  MEDICATIONS:  Current Outpatient Medications  Medication Sig Dispense Refill  . albuterol (PROVENTIL HFA;VENTOLIN HFA) 108 (90 Base) MCG/ACT inhaler Inhale 2 puffs into the lungs every 4 (four) hours as needed for wheezing or shortness of breath.    . ALPRAZolam (XANAX) 0.25 MG tablet Take 2 tablets (0.5 mg total) by mouth at bedtime as needed for anxiety. 30 tablet 0  . amLODipine (NORVASC) 5 MG tablet Take 5 mg by mouth daily.      . cyclobenzaprine (FLEXERIL) 5 MG tablet TAKE  ONE TABLET 3 TIMES A DAY AS NEEDED. 30 tablet 0  . dicyclomine (BENTYL) 20 MG tablet Take 20 mg by mouth 3 (three) times daily as needed for spasms.    . diphenoxylate-atropine (LOMOTIL) 2.5-0.025 MG tablet TAKE 1 TABLET BY MOUTH 4 TIMES DAILY AS NEEDED FOR LOOSE STOOLS. 30 tablet 0  . estazolam (PROSOM) 2 MG tablet Take 2 mg by mouth at bedtime.    Marland Kitchen estradiol (ESTRACE) 2 MG tablet Take 2 mg by mouth daily.      Marland Kitchen FLUoxetine (PROZAC) 40 MG capsule Take 40 mg by mouth daily.   2  . HYDROcodone-acetaminophen (NORCO/VICODIN) 5-325 MG tablet Take 1 tablet by mouth every 4 (four) hours as needed for moderate pain.   0  . levothyroxine (SYNTHROID, LEVOTHROID) 50 MCG tablet Take 50 mcg by mouth daily before breakfast.     . lidocaine-prilocaine (EMLA) cream Apply 1 application topically as needed. To numb skin over port a cath: Squeeze a  small amount on cotton ball and place over port site 1-2 hours prior to chemotherapy. 30 g 0  . loperamide (IMODIUM) 2 MG capsule Take 2 mg by mouth every 2 (two) hours as needed for diarrhea or loose stools.    . magnesium oxide (MAG-OX) 400 (241.3 Mg) MG tablet Take 1 tablet (400 mg total) by mouth 2 (two) times daily. 60 tablet 1  . metoprolol succinate (TOPROL-XL) 25 MG 24 hr tablet Take 25 mg by mouth daily.    . nitrofurantoin, macrocrystal-monohydrate, (MACROBID) 100 MG capsule Take 1 capsule (100 mg total) by mouth 2 (two) times daily. 10 capsule 0  . ondansetron (ZOFRAN-ODT) 4 MG disintegrating tablet Dissolve one tablet by mouth every 8 hours as needed for nausea and vomiting, 20 tablet 1  . pantoprazole (PROTONIX) 40 MG tablet Take 40 mg by mouth 2 (two) times daily before a meal.   10  . potassium chloride SA (K-DUR,KLOR-CON) 20 MEQ tablet Take 20 mEq by mouth daily as needed (for supplement).    . promethazine (PHENERGAN) 25 MG tablet TAKE ONE TABLET BY MOUTH EVERY 6 HOURS AS NEEDED. (Patient taking differently: Take 25 mg by mouth every 6 (six) hours as  needed for nausea or vomiting. ) 30 tablet 0  . senna-docusate (SENOKOT-S) 8.6-50 MG tablet Take 1 tablet by mouth daily as needed for mild constipation or moderate constipation.      No current facility-administered medications for this visit.    Facility-Administered Medications Ordered in Other Visits  Medication Dose Route Frequency Provider Last Rate Last Dose  . sodium chloride flush (NS) 0.9 % injection 10 mL  10 mL Intracatheter PRN Curt Bears, MD   10 mL at 03/16/19 1001    SURGICAL HISTORY:  Past Surgical History:  Procedure Laterality Date  . BIOPSY N/A 05/25/2013   Procedure: BIOPSIES (Random Colon; Duodenal; Gastric);  Surgeon: Danie Binder, MD;  Location: [facility];  Service: Endoscopy;  Laterality: N/A;  .  BLADDER SUSPENSION    . BREAST ENHANCEMENT SURGERY    . BREAST IMPLANT REMOVAL    . CERVICAL FUSION  AUG 2013  . CHOLECYSTECTOMY  1999  . COLONOSCOPY  2007 [place]   POLYPS  . COLONOSCOPY WITH PROPOFOL N/A 05/25/2013   Procedure: COLONOSCOPY WITH PROPOFOL(at cecum 0957) total withdrawal time=37min);  Surgeon: Danie Binder, MD;  Location: [facility];  Service: Endoscopy;  Laterality: N/A;  . ESOPHAGOGASTRODUODENOSCOPY (EGD) WITH PROPOFOL N/A 05/25/2013   Procedure: ESOPHAGOGASTRODUODENOSCOPY (EGD) WITH PROPOFOL;  Surgeon: Danie Binder, MD;  Location: [facility];  Service: Endoscopy;  Laterality: N/A;  . FOOT SURGERY    . IR FLUORO GUIDE PORT INSERTION RIGHT  03/04/2018  . IR US GUIDE VASC ACCESS RIGHT  03/04/2018  . POLYPECTOMY N/A 05/25/2013   Procedure: POLYPECTOMY (Rectal and Gastric);  Surgeon: Danie Binder, MD;  Location: [facility];  Service: Endoscopy;  Laterality: N/A;  . TONSILLECTOMY    . UPPER GASTROINTESTINAL ENDOSCOPY    . VIDEO BRONCHOSCOPY WITH ENDOBRONCHIAL ULTRASOUND  09/12/2016   Procedure: VIDEO BRONCHOSCOPY WITH ENDOBRONCHIAL ULTRASOUND AND BIOPSY;  Surgeon: Juanito Doom, MD;  Location: [facility];  Service: Cardiopulmonary;;    REVIEW OF SYSTEMS:  A  comprehensive review of systems was negative except for: Constitutional: positive for fatigue Gastrointestinal: positive for abdominal pain, diarrhea and nausea   PHYSICAL EXAMINATION: General appearance: alert, cooperative, fatigued and no distress Head: Normocephalic, without obvious abnormality, atraumatic Neck: no adenopathy, no JVD, supple, symmetrical, trachea midline and thyroid not enlarged, symmetric, no tenderness/mass/nodules Lymph nodes: Cervical, supraclavicular, and axillary nodes normal. Resp: clear to auscultation bilaterally Back: symmetric, no curvature. ROM normal. No CVA tenderness. Cardio: regular rate and rhythm, S1, S2 normal, no murmur, click, rub or gallop GI: soft, non-tender; bowel sounds normal; no masses,  no organomegaly Extremities: extremities normal, atraumatic, no cyanosis or edema   ECOG PERFORMANCE STATUS: 1 - Symptomatic but completely ambulatory  Blood pressure 133/88, pulse 74, temperature 97.8 F (36.6 C), temperature source Oral, resp. rate 18, height 5\' 4"  (1.626 m), weight 150 lb 14.4 oz (68.4 kg), SpO2 100 %.  LABORATORY DATA: Lab Results  Component Value Date   WBC 5.9 04/03/2019   HGB 9.0 (L) 04/03/2019   HCT 27.1 (L) 04/03/2019   MCV 106.3 (H) 04/03/2019   PLT 133 (L) 04/03/2019      Chemistry      Component Value Date/Time   NA 136 04/03/2019 1550   NA 141 09/08/2017 1009   K 3.4 (L) 04/03/2019 1550   K 3.5 09/08/2017 1009   CL 100 04/03/2019 1550   CO2 27 04/03/2019 1550   CO2 29 09/08/2017 1009   BUN 13 04/03/2019 1550   BUN 5.8 (L) 09/08/2017 1009   CREATININE 0.79 04/03/2019 1550   CREATININE 0.74 03/31/2019 0806   CREATININE 0.7 09/08/2017 1009      Component Value Date/Time   CALCIUM 8.6 (L) 04/03/2019 1550   CALCIUM 9.0 09/08/2017 1009   ALKPHOS 48 04/03/2019 1550   ALKPHOS 49 09/08/2017 1009   AST 13 (L) 04/03/2019 1550   AST 13 (L) 03/31/2019 0806   AST 9 09/08/2017 1009   ALT 10 04/03/2019 1550   ALT 6  03/31/2019 0806   ALT <6 09/08/2017 1009   BILITOT 0.6 04/03/2019 1550   BILITOT 0.4 03/31/2019 0806   BILITOT 0.51 09/08/2017 1009       RADIOGRAPHIC STUDIES: Dg Chest Port 1 View  Result Date: 04/03/2019 CLINICAL DATA:  Weakness.  History of lung cancer on chemotherapy. EXAM: PORTABLE CHEST 1 VIEW COMPARISON:  Chest radiographs 01/06/2019 and CT 01/08/2019 FINDINGS: The patient is mildly rotated to the left. A right jugular Port-A-Cath terminates over the lower SVC. The cardiomediastinal silhouette is unchanged with normal heart size. Scarring or atelectasis is again seen medially in the left upper and left lower lungs. No sizable pleural effusion or pneumothorax is identified. Prior cervical spine fusion is noted. IMPRESSION: No acute abnormality identified in the chest. Electronically Signed   By: Logan Bores M.D.   On: 04/03/2019 16:38    ASSESSMENT AND PLAN:  This is a very pleasant 63 years old white female was limited stage small cell lung cancer, status post 6 cycles of systemic chemotherapy with carboplatin and etoposide concurrent with radiation and followed by prophylactic cranial irradiation. Patient has been in observation for more than a year.  She has been doing fine but recently started complaining of increasing fatigue and weakness as well as headache. The patient had evidence for disease recurrence and she was a started on treatment with systemic chemotherapy with carboplatin, etoposide and Tecentriq.  She is status post 6 cycles.  The patient had no evidence for disease progression after the induction phase of her treatment.  She was started on maintenance treatment with Tecentriq Huey Bienenstock) status post 6 cycles.  She has been tolerating this treatment well.   She had evidence for disease progression and the patient was started on systemic chemotherapy with cisplatin and irinotecan on days 1 and 8.  She is tolerating her treatment much better after her dose was reduced to  cisplatin 25 mg/M2 and irinotecan 50 mg/M2 on days 1 and 8.  She is status post 5 cycles. I recommended for the patient to proceed with day #8 of cycle 6 today. She will have CT scan of the chest, abdomen and pelvis performed bit of this week. For the left lower quadrant abdominal pain, the patient has a history of diverticulitis and her symptoms are consistent with the same condition.  I will start the patient empirically on Cipro 500 mg p.o. daily for 7 days. She will come back for follow-up visit in 2 weeks for evaluation before cycle #7. The patient was advised to call immediately if she has any concerning symptoms in the interval. The patient voices understanding of current disease status and treatment options and is in agreement with the current care plan. All questions were answered. The patient knows to call the clinic with any problems, questions or concerns. We can certainly see the patient much sooner if necessary.   Disclaimer: This note was dictated with voice recognition software. Similar sounding words can inadvertently be transcribed and may not be corrected upon review.

## 2019-04-08 ENCOUNTER — Other Ambulatory Visit: Payer: Self-pay | Admitting: Internal Medicine

## 2019-04-08 DIAGNOSIS — R197 Diarrhea, unspecified: Secondary | ICD-10-CM

## 2019-04-09 ENCOUNTER — Ambulatory Visit (HOSPITAL_COMMUNITY)
Admission: RE | Admit: 2019-04-09 | Discharge: 2019-04-09 | Disposition: A | Discharge status: Home / Self Care | Payer: Medicare Other | Source: Ambulatory Visit | Attending: Internal Medicine | Admitting: Internal Medicine

## 2019-04-09 ENCOUNTER — Other Ambulatory Visit: Payer: Self-pay

## 2019-04-09 DIAGNOSIS — C3492 Malignant neoplasm of unspecified part of left bronchus or lung: Secondary | ICD-10-CM

## 2019-04-09 MED ORDER — IOHEXOL 300 MG/ML  SOLN
100.0000 mL | Freq: Once | INTRAMUSCULAR | Status: AC | PRN
  Administered 2019-04-09: 100 mL via INTRAVENOUS

## 2019-04-13 ENCOUNTER — Ambulatory Visit: Payer: Medicare Other

## 2019-04-13 ENCOUNTER — Other Ambulatory Visit: Payer: Medicare Other

## 2019-04-13 ENCOUNTER — Ambulatory Visit: Payer: Medicare Other | Admitting: Internal Medicine

## 2019-04-16 ENCOUNTER — Other Ambulatory Visit: Payer: Self-pay | Admitting: *Deleted

## 2019-04-16 DIAGNOSIS — C3412 Malignant neoplasm of upper lobe, left bronchus or lung: Secondary | ICD-10-CM

## 2019-04-20 ENCOUNTER — Other Ambulatory Visit: Payer: Self-pay

## 2019-04-20 ENCOUNTER — Inpatient Hospital Stay: Payer: Medicare Other

## 2019-04-20 ENCOUNTER — Encounter: Payer: Self-pay | Admitting: Physician Assistant

## 2019-04-20 ENCOUNTER — Inpatient Hospital Stay (HOSPITAL_BASED_OUTPATIENT_CLINIC_OR_DEPARTMENT_OTHER): Payer: Medicare Other | Admitting: Physician Assistant

## 2019-04-20 ENCOUNTER — Telehealth: Payer: Self-pay | Admitting: *Deleted

## 2019-04-20 VITALS — BP 115/58 | HR 65 | Temp 99.1°F | Resp 18 | Ht 64.0 in | Wt 156.9 lb

## 2019-04-20 DIAGNOSIS — R112 Nausea with vomiting, unspecified: Secondary | ICD-10-CM

## 2019-04-20 DIAGNOSIS — C3412 Malignant neoplasm of upper lobe, left bronchus or lung: Secondary | ICD-10-CM

## 2019-04-20 DIAGNOSIS — D61810 Antineoplastic chemotherapy induced pancytopenia: Secondary | ICD-10-CM | POA: Diagnosis not present

## 2019-04-20 DIAGNOSIS — E860 Dehydration: Secondary | ICD-10-CM

## 2019-04-20 DIAGNOSIS — R110 Nausea: Secondary | ICD-10-CM

## 2019-04-20 DIAGNOSIS — T451X5A Adverse effect of antineoplastic and immunosuppressive drugs, initial encounter: Secondary | ICD-10-CM

## 2019-04-20 DIAGNOSIS — Z5111 Encounter for antineoplastic chemotherapy: Secondary | ICD-10-CM | POA: Diagnosis not present

## 2019-04-20 DIAGNOSIS — R197 Diarrhea, unspecified: Secondary | ICD-10-CM | POA: Diagnosis not present

## 2019-04-20 LAB — CMP (CANCER CENTER ONLY)
ALT: 7 U/L (ref 0–44)
AST: 9 U/L — ABNORMAL LOW (ref 15–41)
Albumin: 3.3 g/dL — ABNORMAL LOW (ref 3.5–5.0)
Alkaline Phosphatase: 51 U/L (ref 38–126)
Anion gap: 8 (ref 5–15)
BUN: 9 mg/dL (ref 8–23)
CO2: 24 mmol/L (ref 22–32)
Calcium: 7.9 mg/dL — ABNORMAL LOW (ref 8.9–10.3)
Chloride: 108 mmol/L (ref 98–111)
Creatinine: 0.83 mg/dL (ref 0.44–1.00)
GFR, Est AFR Am: >60 mL/min (ref >60)
GFR, Estimated: >60 mL/min (ref >60)
Glucose, Bld: 82 mg/dL (ref 70–99)
Potassium: 4 mmol/L (ref 3.5–5.1)
Sodium: 140 mmol/L (ref 135–145)
Total Bilirubin: 0.4 mg/dL (ref 0.3–1.2)
Total Protein: 5.8 g/dL — ABNORMAL LOW (ref 6.5–8.1)

## 2019-04-20 LAB — CBC WITH DIFFERENTIAL (CANCER CENTER ONLY)
Abs Immature Granulocytes: 0.01 10*3/uL (ref 0.00–0.07)
Basophils Absolute: 0 10*3/uL (ref 0.0–0.1)
Basophils Relative: 0 %
Eosinophils Absolute: 0.4 10*3/uL (ref 0.0–0.5)
Eosinophils Relative: 14 %
HCT: 23.5 % — ABNORMAL LOW (ref 36.0–46.0)
Hemoglobin: 7.7 g/dL — ABNORMAL LOW (ref 12.0–15.0)
Immature Granulocytes: 0 %
Lymphocytes Relative: 41 %
Lymphs Abs: 1.2 10*3/uL (ref 0.7–4.0)
MCH: 35.5 pg — ABNORMAL HIGH (ref 26.0–34.0)
MCHC: 32.8 g/dL (ref 30.0–36.0)
MCV: 108.3 fL — ABNORMAL HIGH (ref 80.0–100.0)
Monocytes Absolute: 0.3 10*3/uL (ref 0.1–1.0)
Monocytes Relative: 9 %
Neutro Abs: 1.1 10*3/uL — ABNORMAL LOW (ref 1.7–7.7)
Neutrophils Relative %: 36 %
Platelet Count: 67 10*3/uL — ABNORMAL LOW (ref 150–400)
RBC: 2.17 MIL/uL — ABNORMAL LOW (ref 3.87–5.11)
RDW: 16.2 % — ABNORMAL HIGH (ref 11.5–15.5)
WBC Count: 3 10*3/uL — ABNORMAL LOW (ref 4.0–10.5)
nRBC: 0 % (ref 0.0–0.2)

## 2019-04-20 LAB — MAGNESIUM: Magnesium: 1.1 mg/dL — CL (ref 1.7–2.4)

## 2019-04-20 MED ORDER — SODIUM CHLORIDE 0.9% FLUSH
10.0000 mL | INTRAVENOUS | Status: DC | PRN
  Administered 2019-04-20: 08:00:00 10 mL
  Filled 2019-04-20: qty 10

## 2019-04-20 MED ORDER — SODIUM CHLORIDE 0.9 % IV SOLN
INTRAVENOUS | Status: DC
  Administered 2019-04-20: 09:00:00 via INTRAVENOUS
  Filled 2019-04-20: qty 250

## 2019-04-20 MED ORDER — SODIUM CHLORIDE 0.9 % IV SOLN
Freq: Once | INTRAVENOUS | Status: AC
  Administered 2019-04-20: 09:00:00 via INTRAVENOUS
  Filled 2019-04-20: qty 500

## 2019-04-20 MED ORDER — DIPHENOXYLATE-ATROPINE 2.5-0.025 MG PO TABS: ORAL_TABLET | ORAL | 0 refills | Status: DC

## 2019-04-20 MED ORDER — SODIUM CHLORIDE 0.9% FLUSH
10.0000 mL | INTRAVENOUS | Status: DC | PRN
  Administered 2019-04-20: 10 mL
  Filled 2019-04-20: qty 10

## 2019-04-20 MED ORDER — HEPARIN SOD (PORK) LOCK FLUSH 100 UNIT/ML IV SOLN
500.0000 [IU] | Freq: Once | INTRAVENOUS | Status: AC | PRN
  Administered 2019-04-20: 11:00:00 500 [IU]
  Filled 2019-04-20: qty 5

## 2019-04-20 NOTE — Telephone Encounter (Signed)
Received call from lab with critical result of Magnesium 1.1 Cassie Heilingoetter, PA notified

## 2019-04-20 NOTE — Progress Notes (Signed)
[facility] OFFICE PROGRESS NOTE  Redmond School, MD [address] [place] [address]  DIAGNOSIS: Metastatic small cell lung cancer initially diagnosed as Limited stage (T1b, N2, M0) small cell lung cancer presented with left lower lobe/infrahilar mass and large mediastinal lymphadenopathy diagnosed in October 2017.  PRIOR THERAPY:  1) Systemic chemotherapy with cisplatin 60 MG/M2 on day 1 and etoposide 120 MG/M2 on days 1, 2 and 3 status post 1 cycle. This was discontinued secondary to intolerance. 2) Systemic chemotherapy with carboplatin for AUC of 4 on day 1 and etoposide 100 MG/M2 on days 1, 2 and 3 with Neulasta support on day 4 every 3 weeks. Status post 5 cycles. This was concurrent with radiation in [place], [place]. 3) prophylactic cranial irradiation. 4) Systemic chemotherapy with carboplatin for AUC of 5 on day 1, etoposide 100 mg/M2 on days 1, 2 and 3 as well as a Tecentriq (Atezolizumab) 1200 mg IV every 3 weeks with Neulasta support.  First dose February 17, 2018.  Status post 6 cycles of partial response. 5) Maintenance treatment with immunotherapy with Tecentriq 1200 mg IV every 3 weeks.  First dose June 30, 2018.  Status post 6 cycles.  Last dose was given October 13, 2018 discontinued secondary to disease progression  CURRENT THERAPY: Systemic chemotherapy with cisplatin 30 mg/M2 and irinotecan 65 mg/M2 on days 1 and 8 every 3 weeks.  First dose November 10, 2018.  Starting cycle #2 her dose of cisplatin was reduced to 25 mg/M2 and irinotecan 50 mg/M2 on days 1 and 8 every 3 weeks.  Status post 6 cycles.   INTERVAL HISTORY: SHA BURLING 63 y.o. female returns to clinic for a follow-up visit. The patient is feeling fairly well today today without any concerning complaints. She previously had nausea/vomiting and diarrhea with treatments. She states her nausea and vomiting seems to be improving. She states she experiences nausea with 1-2 episodes of  vomiting for the day following treatment. She uses phenergan and zofran for nausea. Her diarrhea is also under better control with the use of imodium and lomotil. She also experiences electrolyte abnormalities and pancytopenia with treatment. She takes magnesium oxide 400 mg 2-3 times a day for hypomagnesemia.   Otherwise, she denies fever, chills, night sweats, or weight loss. She gained 6 lbs since her last appointment. She endorses her baseline dyspnea on exertion but denies any chest pain, cough, or hemoptysis. She denies headaches or visual changes. The patient notes that she has some mild bruising on her upper extremities after working in the yard. She denies any other known bleeding or bruising including melena, hematochezia, hematuria, epistaxis, or gingival bleeding. She recently had a restaging CT scan performed.  She is here today for evaluation and to review her scan results before starting cycle #7.  MEDICAL HISTORY: Past Medical History:  Diagnosis Date  . 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 Anxiety    takes Prozac daily  . Arthritis   . Benign fundic gland polyps of stomach   . Colon polyps   . COPD (chronic obstructive pulmonary disease) ([place])   . Dehydration 03/06/2017  . Diverticulitis   . Dyspnea    with exertion  . Encounter for antineoplastic chemotherapy 12/03/2016  . Fibromyalgia   . GERD (gastroesophageal reflux disease)    takes Pantoprazole daily  . Hypertension    takes Metoprolol,Triamterene-HCTZ,and Amlodipine daily  . Hypothyroidism    takes Synthroid daily  . IBS (irritable bowel syndrome)   .  lung ca dx'd 10/02/2016   skin, lung  . PONV (postoperative nausea and vomiting)    pt also states that she had some difficulty breathing after cervical fusion    ALLERGIES:  is allergic to penicillins; codeine; fentanyl; ranitidine hcl; keflex [cephalexin]; and lyrica [pregabalin].  MEDICATIONS:  Current Outpatient Medications  Medication Sig  Dispense Refill  . albuterol (PROVENTIL HFA;VENTOLIN HFA) 108 (90 Base) MCG/ACT inhaler Inhale 2 puffs into the lungs every 4 (four) hours as needed for wheezing or shortness of breath.    . ALPRAZolam (XANAX) 0.25 MG tablet Take 2 tablets (0.5 mg total) by mouth at bedtime as needed for anxiety. 30 tablet 0  . amLODipine (NORVASC) 5 MG tablet Take 5 mg by mouth daily.      . ciprofloxacin (CIPRO) 500 MG tablet Take 1 tablet (500 mg total) by mouth 2 (two) times daily. 7 tablet 0  . cyclobenzaprine (FLEXERIL) 5 MG tablet TAKE ONE TABLET 3 TIMES A DAY AS NEEDED. 30 tablet 0  . dicyclomine (BENTYL) 20 MG tablet Take 20 mg by mouth 3 (three) times daily as needed for spasms.    . diphenoxylate-atropine (LOMOTIL) 2.5-0.025 MG tablet TAKE 1 TABLET BY MOUTH 4 TIMES DAILY AS NEEDED FOR LOOSE STOOLS. 30 tablet 0  . estazolam (PROSOM) 2 MG tablet Take 2 mg by mouth at bedtime.    Marland Kitchen estradiol (ESTRACE) 2 MG tablet Take 2 mg by mouth daily.      Marland Kitchen FLUoxetine (PROZAC) 40 MG capsule Take 40 mg by mouth daily.   2  . HYDROcodone-acetaminophen (NORCO/VICODIN) 5-325 MG tablet Take 1 tablet by mouth every 4 (four) hours as needed for moderate pain.   0  . levothyroxine (SYNTHROID, LEVOTHROID) 50 MCG tablet Take 50 mcg by mouth daily before breakfast.     . lidocaine-prilocaine (EMLA) cream Apply 1 application topically as needed. To numb skin over port a cath: Squeeze a  small amount on cotton ball and place over port site 1-2 hours prior to chemotherapy. 30 g 0  . loperamide (IMODIUM) 2 MG capsule Take 2 mg by mouth every 2 (two) hours as needed for diarrhea or loose stools.    . magnesium oxide (MAG-OX) 400 (241.3 Mg) MG tablet Take 1 tablet (400 mg total) by mouth 2 (two) times daily. 60 tablet 1  . metoprolol succinate (TOPROL-XL) 25 MG 24 hr tablet Take 25 mg by mouth daily.    . nitrofurantoin, macrocrystal-monohydrate, (MACROBID) 100 MG capsule Take 1 capsule (100 mg total) by mouth 2 (two) times daily. 10  capsule 0  . ondansetron (ZOFRAN-ODT) 4 MG disintegrating tablet Dissolve one tablet by mouth every 8 hours as needed for nausea and vomiting, 20 tablet 1  . pantoprazole (PROTONIX) 40 MG tablet Take 40 mg by mouth 2 (two) times daily before a meal.   10  . potassium chloride SA (K-DUR,KLOR-CON) 20 MEQ tablet Take 20 mEq by mouth daily as needed (for supplement).    . promethazine (PHENERGAN) 25 MG tablet TAKE ONE TABLET BY MOUTH EVERY 6 HOURS AS NEEDED. (Patient taking differently: Take 25 mg by mouth every 6 (six) hours as needed for nausea or vomiting. ) 30 tablet 0  . senna-docusate (SENOKOT-S) 8.6-50 MG tablet Take 1 tablet by mouth daily as needed for mild constipation or moderate constipation.      No current facility-administered medications for this visit.    Facility-Administered Medications Ordered in Other Visits  Medication Dose Route Frequency Provider Last Rate Last Dose  .  0.9 %  sodium chloride infusion   Intravenous Continuous Curt Bears, MD 20 mL/hr at 04/20/19 0910    . heparin lock flush 100 unit/mL  500 Units Intracatheter Once PRN Curt Bears, MD      . sodium chloride 0.9 % 500 mL with magnesium sulfate 4 g infusion   Intravenous Once Curt Bears, MD 250 mL/hr at 04/20/19 0915    . sodium chloride flush (NS) 0.9 % injection 10 mL  10 mL Intracatheter PRN Curt Bears, MD   10 mL at 03/16/19 1001    SURGICAL HISTORY:  Past Surgical History:  Procedure Laterality Date  . BIOPSY N/A 05/25/2013   Procedure: BIOPSIES (Random Colon; Duodenal; Gastric);  Surgeon: Danie Binder, MD;  Location: [facility];  Service: Endoscopy;  Laterality: N/A;  . BLADDER SUSPENSION    . BREAST ENHANCEMENT SURGERY    . BREAST IMPLANT REMOVAL    . CERVICAL FUSION  AUG 2013  . CHOLECYSTECTOMY  1999  . COLONOSCOPY  2007    POLYPS  . COLONOSCOPY WITH PROPOFOL N/A 05/25/2013   Procedure: COLONOSCOPY WITH PROPOFOL(at cecum 0957) total withdrawal time=35min);  Surgeon:  Danie Binder, MD;  Location: [facility];  Service: Endoscopy;  Laterality: N/A;  . ESOPHAGOGASTRODUODENOSCOPY (EGD) WITH PROPOFOL N/A 05/25/2013   Procedure: ESOPHAGOGASTRODUODENOSCOPY (EGD) WITH PROPOFOL;  Surgeon: Danie Binder, MD;  Location: [facility];  Service: Endoscopy;  Laterality: N/A;  . FOOT SURGERY    . IR FLUORO GUIDE PORT INSERTION RIGHT  03/04/2018  . IR US GUIDE VASC ACCESS RIGHT  03/04/2018  . POLYPECTOMY N/A 05/25/2013   Procedure: POLYPECTOMY (Rectal and Gastric);  Surgeon: Danie Binder, MD;  Location: [facility];  Service: Endoscopy;  Laterality: N/A;  . TONSILLECTOMY    . UPPER GASTROINTESTINAL ENDOSCOPY    . VIDEO BRONCHOSCOPY WITH ENDOBRONCHIAL ULTRASOUND  09/12/2016   Procedure: VIDEO BRONCHOSCOPY WITH ENDOBRONCHIAL ULTRASOUND AND BIOPSY;  Surgeon: Juanito Doom, MD;  Location: [facility];  Service: Cardiopulmonary;;    REVIEW OF SYSTEMS:   Review of Systems  Constitutional: Positive for fatigue. Negative for appetite change, chills, fever and unexpected weight change.  HENT:   Negative for mouth sores, nosebleeds, sore throat and trouble swallowing.   Eyes: Negative for eye problems and icterus.  Respiratory: Positive for shortness of breath with exertion. Negative for cough, hemoptysis, and wheezing.   Cardiovascular: Negative for chest pain and leg swelling.  Gastrointestinal: Positive for diarrhea and occasional nausea and vomiting after treatment. Negative for abdominal pain and constipation. Genitourinary: Negative for bladder incontinence, difficulty urinating, dysuria, frequency and hematuria.   Musculoskeletal: Negative for back pain, gait problem, neck pain and neck stiffness.  Skin: Negative for itching and rash.  Neurological: Negative for dizziness, extremity weakness, gait problem, headaches, light-headedness and seizures.  Hematological: Negative for adenopathy. Does not bruise/bleed easily.  Psychiatric/Behavioral: Negative for confusion, depression and sleep  disturbance. The patient is not nervous/anxious.     PHYSICAL EXAMINATION:  Blood pressure (!) 115/58, pulse 65, temperature 99.1 F (37.3 C), temperature source Oral, resp. rate 18, height 5\' 4"  (1.626 m), weight 156 lb 14.4 oz (71.2 kg), SpO2 97 %.  ECOG PERFORMANCE STATUS: 1 - Symptomatic but completely ambulatory  Physical Exam  Constitutional: Oriented to person, place, and time and well-developed, well-nourished, and in no distress.  HENT:  Head: Normocephalic and atraumatic.  Mouth/Throat: Oropharynx is clear and moist. No oropharyngeal exudate.  Eyes: Conjunctivae are normal. Right eye exhibits no discharge. Left eye exhibits no  discharge. No scleral icterus.  Neck: Normal range of motion. Neck supple.  Cardiovascular: Normal rate, regular rhythm, normal heart sounds and intact distal pulses.   Pulmonary/Chest: Effort normal and breath sounds normal. No respiratory distress. No wheezes. No rales.  Abdominal: Soft. Bowel sounds are normal. Exhibits no distension and no mass. There is no tenderness.  Musculoskeletal: Normal range of motion. Exhibits no edema.  Lymphadenopathy:    No cervical adenopathy.  Neurological: Alert and oriented to person, place, and time. Exhibits normal muscle tone. Gait normal. Coordination normal.  Skin: Skin is warm and dry. No rash noted. Not diaphoretic. No erythema. No pallor.  Psychiatric: Mood, memory and judgment normal.  Vitals reviewed.  LABORATORY DATA: Lab Results  Component Value Date   WBC 3.0 (L) 04/20/2019   HGB 7.7 (L) 04/20/2019   HCT 23.5 (L) 04/20/2019   MCV 108.3 (H) 04/20/2019   PLT 67 (L) 04/20/2019      Chemistry      Component Value Date/Time   NA 140 04/20/2019 0754   NA 141 09/08/2017 1009   K 4.0 04/20/2019 0754   K 3.5 09/08/2017 1009   CL 108 04/20/2019 0754   CO2 24 04/20/2019 0754   CO2 29 09/08/2017 1009   BUN 9 04/20/2019 0754   BUN 5.8 (L) 09/08/2017 1009   CREATININE 0.83 04/20/2019 0754    CREATININE 0.7 09/08/2017 1009      Component Value Date/Time   CALCIUM 7.9 (L) 04/20/2019 0754   CALCIUM 9.0 09/08/2017 1009   ALKPHOS 51 04/20/2019 0754   ALKPHOS 49 09/08/2017 1009   AST 9 (L) 04/20/2019 0754   AST 9 09/08/2017 1009   ALT 7 04/20/2019 0754   ALT <6 09/08/2017 1009   BILITOT 0.4 04/20/2019 0754   BILITOT 0.51 09/08/2017 1009       RADIOGRAPHIC STUDIES:  Ct Chest W Contrast  Result Date: 04/09/2019 CLINICAL DATA:  Restaging metastatic small cell lung cancer EXAM: CT CHEST, ABDOMEN, AND PELVIS WITH CONTRAST TECHNIQUE: Multidetector CT imaging of the chest, abdomen and pelvis was performed following the standard protocol during bolus administration of intravenous contrast. CONTRAST:  12mL OMNIPAQUE IOHEXOL 300 MG/ML SOLN, additional oral enteric contrast COMPARISON:  CT chest, 01/08/2019, CT abdomen pelvis, 01/06/2019, CT chest abdomen pelvis, 10/27/2018 FINDINGS: CT CHEST FINDINGS Cardiovascular: No significant vascular findings. Normal heart size. No pericardial effusion. Mediastinum/Nodes: No significant change in bulky soft tissue about the left hilum, AP window, midesophagus, and carina. Lungs/Pleura: There has been significant interval resolution of previously seen right upper lobe opacity and consolidation. Left upper lobe consolidation and volume loss is not significantly changed. There has been interval resolution of a previously seen small left pleural effusion. Mild underlying centrilobular emphysema. Musculoskeletal: No chest wall mass. CT ABDOMEN PELVIS FINDINGS Hepatobiliary: A previously described left lobe liver metastatic lesion is not well appreciated on current examination (series 2, image 56). No new lesions noted. Status post cholecystectomy with postoperative biliary ductal dilatation. Pancreas: Unremarkable. No pancreatic ductal dilatation or surrounding inflammatory changes. Spleen: Normal in size without focal abnormality. Adrenals/Urinary Tract: Adrenal  glands are unremarkable. Kidneys are normal, without renal calculi, focal lesion, or hydronephrosis. Bladder is unremarkable. Stomach/Bowel: Stomach is within normal limits. No evidence of bowel wall thickening, distention, or inflammatory changes. Sigmoid diverticulosis. Vascular/Lymphatic: No significant vascular findings are present. No enlarged abdominal or pelvic lymph nodes. Reproductive: No mass or other abnormality. Other: No abdominal wall hernia or abnormality. No abdominopelvic ascites. Musculoskeletal: No change in a densely  sclerotic metastatic lesion of the L3 vertebral body. No new metastatic lesions are noted. IMPRESSION: 1. There has been significant interval resolution of previously seen right upper lobe opacity and consolidation, most consistent with resolving radiation pneumonitis. 2. Left upper lobe post treatment consolidation and volume loss is not significantly changed. No significant change in bulky soft tissue about the left hilum, AP window, midesophagus, and carina. 3. There has been interval resolution of a previously seen small left pleural effusion. Mild underlying centrilobular emphysema. 4. A previously described left lobe liver metastatic lesion is not well appreciated on current examination (series 2, image 56). No new evidence of metastatic disease in the abdomen or pelvis. 5. No change in a densely sclerotic metastatic lesion of the L3 vertebral body. No new osseous metastatic lesions are noted. Electronically Signed   By: Eddie Candle M.D.   On: 04/09/2019 12:32   Ct Abdomen Pelvis W Contrast  Result Date: 04/09/2019 CLINICAL DATA:  Restaging metastatic small cell lung cancer EXAM: CT CHEST, ABDOMEN, AND PELVIS WITH CONTRAST TECHNIQUE: Multidetector CT imaging of the chest, abdomen and pelvis was performed following the standard protocol during bolus administration of intravenous contrast. CONTRAST:  176mL OMNIPAQUE IOHEXOL 300 MG/ML SOLN, additional oral enteric contrast  COMPARISON:  CT chest, 01/08/2019, CT abdomen pelvis, 01/06/2019, CT chest abdomen pelvis, 10/27/2018 FINDINGS: CT CHEST FINDINGS Cardiovascular: No significant vascular findings. Normal heart size. No pericardial effusion. Mediastinum/Nodes: No significant change in bulky soft tissue about the left hilum, AP window, midesophagus, and carina. Lungs/Pleura: There has been significant interval resolution of previously seen right upper lobe opacity and consolidation. Left upper lobe consolidation and volume loss is not significantly changed. There has been interval resolution of a previously seen small left pleural effusion. Mild underlying centrilobular emphysema. Musculoskeletal: No chest wall mass. CT ABDOMEN PELVIS FINDINGS Hepatobiliary: A previously described left lobe liver metastatic lesion is not well appreciated on current examination (series 2, image 56). No new lesions noted. Status post cholecystectomy with postoperative biliary ductal dilatation. Pancreas: Unremarkable. No pancreatic ductal dilatation or surrounding inflammatory changes. Spleen: Normal in size without focal abnormality. Adrenals/Urinary Tract: Adrenal glands are unremarkable. Kidneys are normal, without renal calculi, focal lesion, or hydronephrosis. Bladder is unremarkable. Stomach/Bowel: Stomach is within normal limits. No evidence of bowel wall thickening, distention, or inflammatory changes. Sigmoid diverticulosis. Vascular/Lymphatic: No significant vascular findings are present. No enlarged abdominal or pelvic lymph nodes. Reproductive: No mass or other abnormality. Other: No abdominal wall hernia or abnormality. No abdominopelvic ascites. Musculoskeletal: No change in a densely sclerotic metastatic lesion of the L3 vertebral body. No new metastatic lesions are noted. IMPRESSION: 1. There has been significant interval resolution of previously seen right upper lobe opacity and consolidation, most consistent with resolving radiation  pneumonitis. 2. Left upper lobe post treatment consolidation and volume loss is not significantly changed. No significant change in bulky soft tissue about the left hilum, AP window, midesophagus, and carina. 3. There has been interval resolution of a previously seen small left pleural effusion. Mild underlying centrilobular emphysema. 4. A previously described left lobe liver metastatic lesion is not well appreciated on current examination (series 2, image 56). No new evidence of metastatic disease in the abdomen or pelvis. 5. No change in a densely sclerotic metastatic lesion of the L3 vertebral body. No new osseous metastatic lesions are noted. Electronically Signed   By: Eddie Candle M.D.   On: 04/09/2019 12:32   Dg Chest Port 1 View  Result Date: 04/03/2019 CLINICAL DATA:  Weakness.  History of lung cancer on chemotherapy. EXAM: PORTABLE CHEST 1 VIEW COMPARISON:  Chest radiographs 01/06/2019 and CT 01/08/2019 FINDINGS: The patient is mildly rotated to the left. A right jugular Port-A-Cath terminates over the lower SVC. The cardiomediastinal silhouette is unchanged with normal heart size. Scarring or atelectasis is again seen medially in the left upper and left lower lungs. No sizable pleural effusion or pneumothorax is identified. Prior cervical spine fusion is noted. IMPRESSION: No acute abnormality identified in the chest. Electronically Signed   By: Logan Bores M.D.   On: 04/03/2019 16:38     ASSESSMENT/PLAN:  This is a very pleasant 63 year old Caucasian female who was initially diagnosed with limited stage small cell lung cancer.  She is status post 6 cycles of systemic chemotherapy with carboplatin and etoposide with concurrent radiation.  She is also status post prophylactic cranial irradiation.  She had been on observation for 1 year but started to develop increased fatigue, weakness, and headaches.  Further evaluation showed disease recurrence and she was then  started on treatment with  carboplatin, etoposide, and Tecentriq with maintenance Tecentriq.  She showed evidence of disease progression is currently being treated on systemic chemotherapy with cisplatin and irinotecan on days 1 and 8 every 3 weeks. She is status post 6 cycles. Her dose of Cisplatin was reduced to 25 mg/m2 and irinotecan 50 mg/m2. She experiences nausea, vomiting, diarrhea, electrolyte abnormalities, and pancytopenia with treatment.   The patient recently had a restaging CT scan of the chest, abdomen, and pelvis performed.  Dr. Julien Nordmann personally and independently reviewed the scan results and discussed the results with the patient today.  The scan showed no evidence of disease progression.  Upon reviewing the patient's labs today, the patient was noted to have pancytopenia. Her platelet count is 67,000 today.  Additionally, patient's magnesium is low at 1.1 today.  Dr. Julien Nordmann recommends that we delay the patient's treatment by 1 week.  She will receive 4 g of magnesium sulfate IV today for her hypomagnesemia.  She will also continue to take her magnesium oxide 400 mg TID at home.   I will see the patient back for follow-up visit in 4 weeks for evaluation before starting cycle #8.  She will continue taking Zofran and Phenergan for her nausea.  For the patient's diarrhea, I have sent a refill for Lomotil to the patient's pharmacy since that has worked well for her recently.  She will continue to take Lomotil and Imodium as needed for diarrhea. The patient was advised to call immediately if she has any concerning symptoms in the interval. The patient voices understanding of current disease status and treatment options and is in agreement with the current care plan. All questions were answered. The patient knows to call the clinic with any problems, questions or concerns. We can certainly see the patient much sooner if necessary  Orders Placed This Encounter  Procedures  . CMP ([facility] only)     Standing Status:   Standing    Number of Occurrences:   15    Standing Expiration Date:   04/19/2020  . CBC with Differential ([facility] Only)    Standing Status:   Standing    Number of Occurrences:   15    Standing Expiration Date:   04/19/2020  . Magnesium    Standing Status:   Standing    Number of Occurrences:   15    Standing Expiration Date:   04/19/2020  Gisele Pack L Rosaleah Person, PA-C 04/20/19  ADDENDUM: Hematology/Oncology Attending: I had a face-to-face encounter with the patient today.  I recommended her care plan.  This is a very pleasant 63 years old white female with extensive stage small cell lung cancer status post several chemotherapy regimens and she is currently on treatment with reduced dose cisplatin and irinotecan status post 6 cycles.  The patient has been tolerating this treatment well except for mild nausea as well as electrolyte imbalance. She had repeat CT scan of the chest, abdomen and pelvis performed recently.  I personally and independently reviewed the scans and discussed the results with the patient today. Her scan showed no concerning findings for disease progression. I recommended for her to continue her current treatment with reduced dose cisplatin and irinotecan but we will delay the start of cycle number 7 by 1 week until improvement of her platelets count. For the hypomagnesemia, will arrange for the patient to receive magnesium sulfate intravenously in the clinic today. The patient will come back for follow-up visit in 4 weeks for evaluation before starting cycle #8. She was advised to call immediately if she has any concerning symptoms in the interval.  Disclaimer: This note was dictated with voice recognition software. Similar sounding words can inadvertently be transcribed and may be missed upon review. Eilleen Kempf, MD 04/20/19

## 2019-04-20 NOTE — Patient Instructions (Signed)
Magnesium Sulfate injection What is this medicine? MAGNESIUM SULFATE (mag NEE zee um SUL fate) is an electrolyte injection commonly used to treat low magnesium levels in your blood. It is also used to prevent or control seizures in women with preeclampsia or eclampsia. This medicine may be used for other purposes; ask your health care provider or pharmacist if you have questions. What should I tell my health care provider before I take this medicine? They need to know if you have any of these conditions: -heart disease -history of irregular heart beat -kidney disease -an unusual or allergic reaction to magnesium sulfate, medicines, foods, dyes, or preservatives -pregnant or trying to get pregnant -breast-feeding How should I use this medicine? This medicine is for infusion into a vein. It is given by a health care professional in a hospital or clinic setting. Talk to your pediatrician regarding the use of this medicine in children. While this drug may be prescribed for selected conditions, precautions do apply. Overdosage: If you think you have taken too much of this medicine contact a poison control center or emergency room at once. NOTE: This medicine is only for you. Do not share this medicine with others. What if I miss a dose? This does not apply. What may interact with this medicine? This medicine may interact with the following medications: -certain medicines for anxiety or sleep -certain medicines for seizures like phenobarbital -digoxin -medicines that relax muscles for surgery -narcotic medicines for pain This list may not describe all possible interactions. Give your health care provider a list of all the medicines, herbs, non-prescription drugs, or dietary supplements you use. Also tell them if you smoke, drink alcohol, or use illegal drugs. Some items may interact with your medicine. What should I watch for while using this medicine? Your condition will be monitored carefully  while you are receiving this medicine. You may need blood work done while you are receiving this medicine. What side effects may I notice from receiving this medicine? Side effects that you should report to your doctor or health care professional as soon as possible: -allergic reactions like skin rash, itching or hives, swelling of the face, lips, or tongue -facial flushing -muscle weakness -signs and symptoms of low blood pressure like dizziness; feeling faint or lightheaded, falls; unusually weak or tired -signs and symptoms of a dangerous change in heartbeat or heart rhythm like chest pain; dizziness; fast or irregular heartbeat; palpitations; breathing problems -sweating This list may not describe all possible side effects. Call your doctor for medical advice about side effects. You may report side effects to FDA at 1-800-FDA-1088. Where should I keep my medicine? This drug is given in a hospital or clinic and will not be stored at home. NOTE: This sheet is a summary. It may not cover all possible information. If you have questions about this medicine, talk to your doctor, pharmacist, or health care provider.  2019 Elsevier/Gold Standard (2016-06-05 12:31:42)

## 2019-04-23 ENCOUNTER — Telehealth: Payer: Self-pay | Admitting: Internal Medicine

## 2019-04-23 NOTE — Telephone Encounter (Signed)
Called to confirm appt for 5/26 appt - pt aware of appts scheduled per 5/19 sch message

## 2019-04-27 ENCOUNTER — Other Ambulatory Visit: Payer: Self-pay

## 2019-04-27 ENCOUNTER — Inpatient Hospital Stay: Payer: Medicare Other

## 2019-04-27 ENCOUNTER — Other Ambulatory Visit: Payer: Self-pay | Admitting: Internal Medicine

## 2019-04-27 ENCOUNTER — Telehealth: Payer: Self-pay | Admitting: *Deleted

## 2019-04-27 ENCOUNTER — Ambulatory Visit: Payer: Medicare Other

## 2019-04-27 DIAGNOSIS — E860 Dehydration: Secondary | ICD-10-CM

## 2019-04-27 DIAGNOSIS — R112 Nausea with vomiting, unspecified: Secondary | ICD-10-CM

## 2019-04-27 DIAGNOSIS — Z5111 Encounter for antineoplastic chemotherapy: Secondary | ICD-10-CM | POA: Diagnosis not present

## 2019-04-27 DIAGNOSIS — C3412 Malignant neoplasm of upper lobe, left bronchus or lung: Secondary | ICD-10-CM

## 2019-04-27 LAB — CBC WITH DIFFERENTIAL (CANCER CENTER ONLY)
Abs Immature Granulocytes: 0.01 10*3/uL (ref 0.00–0.07)
Basophils Absolute: 0 10*3/uL (ref 0.0–0.1)
Basophils Relative: 0 %
Eosinophils Absolute: 0.3 10*3/uL (ref 0.0–0.5)
Eosinophils Relative: 10 %
HCT: 23.9 % — ABNORMAL LOW (ref 36.0–46.0)
Hemoglobin: 8 g/dL — ABNORMAL LOW (ref 12.0–15.0)
Immature Granulocytes: 0 %
Lymphocytes Relative: 46 %
Lymphs Abs: 1.4 10*3/uL (ref 0.7–4.0)
MCH: 36.7 pg — ABNORMAL HIGH (ref 26.0–34.0)
MCHC: 33.5 g/dL (ref 30.0–36.0)
MCV: 109.6 fL — ABNORMAL HIGH (ref 80.0–100.0)
Monocytes Absolute: 0.4 10*3/uL (ref 0.1–1.0)
Monocytes Relative: 13 %
Neutro Abs: 1 10*3/uL — ABNORMAL LOW (ref 1.7–7.7)
Neutrophils Relative %: 31 %
Platelet Count: 76 10*3/uL — ABNORMAL LOW (ref 150–400)
RBC: 2.18 MIL/uL — ABNORMAL LOW (ref 3.87–5.11)
RDW: 16.7 % — ABNORMAL HIGH (ref 11.5–15.5)
WBC Count: 3 10*3/uL — ABNORMAL LOW (ref 4.0–10.5)
nRBC: 0 % (ref 0.0–0.2)

## 2019-04-27 LAB — CMP (CANCER CENTER ONLY)
ALT: 6 U/L (ref 0–44)
AST: 10 U/L — ABNORMAL LOW (ref 15–41)
Albumin: 3.4 g/dL — ABNORMAL LOW (ref 3.5–5.0)
Alkaline Phosphatase: 53 U/L (ref 38–126)
Anion gap: 9 (ref 5–15)
BUN: 11 mg/dL (ref 8–23)
CO2: 26 mmol/L (ref 22–32)
Calcium: 8 mg/dL — ABNORMAL LOW (ref 8.9–10.3)
Chloride: 106 mmol/L (ref 98–111)
Creatinine: 0.9 mg/dL (ref 0.44–1.00)
GFR, Est AFR Am: >60 mL/min (ref >60)
GFR, Estimated: >60 mL/min (ref >60)
Glucose, Bld: 90 mg/dL (ref 70–99)
Potassium: 3.9 mmol/L (ref 3.5–5.1)
Sodium: 141 mmol/L (ref 135–145)
Total Bilirubin: 0.4 mg/dL (ref 0.3–1.2)
Total Protein: 6 g/dL — ABNORMAL LOW (ref 6.5–8.1)

## 2019-04-27 LAB — MAGNESIUM: Magnesium: 1.2 mg/dL — CL (ref 1.7–2.4)

## 2019-04-27 MED ORDER — SODIUM CHLORIDE 0.9 % IV SOLN
INTRAVENOUS | Status: AC
  Administered 2019-04-27: 09:00:00 via INTRAVENOUS
  Filled 2019-04-27: qty 250

## 2019-04-27 MED ORDER — SODIUM CHLORIDE 0.9 % IV SOLN
Freq: Once | INTRAVENOUS | Status: AC
  Administered 2019-04-27: 10:00:00 via INTRAVENOUS
  Filled 2019-04-27: qty 500

## 2019-04-27 MED ORDER — SODIUM CHLORIDE 0.9% FLUSH
10.0000 mL | INTRAVENOUS | Status: DC | PRN
  Administered 2019-04-27: 08:00:00 10 mL
  Filled 2019-04-27: qty 10

## 2019-04-27 MED ORDER — SODIUM CHLORIDE 0.9% FLUSH
10.0000 mL | INTRAVENOUS | Status: AC | PRN
  Administered 2019-04-27: 10 mL
  Filled 2019-04-27: qty 10

## 2019-04-27 MED ORDER — SODIUM CHLORIDE 0.9 % IV SOLN
4.0000 g | Freq: Once | INTRAVENOUS | Status: DC
  Filled 2019-04-27: qty 8

## 2019-04-27 MED ORDER — HEPARIN SOD (PORK) LOCK FLUSH 100 UNIT/ML IV SOLN
500.0000 [IU] | Freq: Once | INTRAVENOUS | Status: AC | PRN
  Administered 2019-04-27: 12:00:00 500 [IU]
  Filled 2019-04-27: qty 5

## 2019-04-27 NOTE — Telephone Encounter (Signed)
Received call report from [place].  "Today's Mg = 1.2."  Secure chat sent with results.   Further communication through infusion nurse per treatment protocol standards.

## 2019-04-27 NOTE — Patient Instructions (Signed)
Magnesium Sulfate injection What is this medicine? MAGNESIUM SULFATE (mag NEE zee um SUL fate) is an electrolyte injection commonly used to treat low magnesium levels in your blood. It is also used to prevent or control seizures in women with preeclampsia or eclampsia. This medicine may be used for other purposes; ask your health care provider or pharmacist if you have questions. What should I tell my health care provider before I take this medicine? They need to know if you have any of these conditions: -heart disease -history of irregular heart beat -kidney disease -an unusual or allergic reaction to magnesium sulfate, medicines, foods, dyes, or preservatives -pregnant or trying to get pregnant -breast-feeding How should I use this medicine? This medicine is for infusion into a vein. It is given by a health care professional in a hospital or clinic setting. Talk to your pediatrician regarding the use of this medicine in children. While this drug may be prescribed for selected conditions, precautions do apply. Overdosage: If you think you have taken too much of this medicine contact a poison control center or emergency room at once. NOTE: This medicine is only for you. Do not share this medicine with others. What if I miss a dose? This does not apply. What may interact with this medicine? This medicine may interact with the following medications: -certain medicines for anxiety or sleep -certain medicines for seizures like phenobarbital -digoxin -medicines that relax muscles for surgery -narcotic medicines for pain This list may not describe all possible interactions. Give your health care provider a list of all the medicines, herbs, non-prescription drugs, or dietary supplements you use. Also tell them if you smoke, drink alcohol, or use illegal drugs. Some items may interact with your medicine. What should I watch for while using this medicine? Your condition will be monitored carefully  while you are receiving this medicine. You may need blood work done while you are receiving this medicine. What side effects may I notice from receiving this medicine? Side effects that you should report to your doctor or health care professional as soon as possible: -allergic reactions like skin rash, itching or hives, swelling of the face, lips, or tongue -facial flushing -muscle weakness -signs and symptoms of low blood pressure like dizziness; feeling faint or lightheaded, falls; unusually weak or tired -signs and symptoms of a dangerous change in heartbeat or heart rhythm like chest pain; dizziness; fast or irregular heartbeat; palpitations; breathing problems -sweating This list may not describe all possible side effects. Call your doctor for medical advice about side effects. You may report side effects to FDA at 1-800-FDA-1088. Where should I keep my medicine? This drug is given in a hospital or clinic and will not be stored at home. NOTE: This sheet is a summary. It may not cover all possible information. If you have questions about this medicine, talk to your doctor, pharmacist, or health care provider.  2019 Elsevier/Gold Standard (2016-06-05 12:31:42)

## 2019-04-28 ENCOUNTER — Other Ambulatory Visit: Payer: Medicare Other

## 2019-04-28 ENCOUNTER — Ambulatory Visit: Payer: Medicare Other

## 2019-05-03 ENCOUNTER — Encounter (HOSPITAL_COMMUNITY): Payer: Self-pay | Admitting: Emergency Medicine

## 2019-05-03 ENCOUNTER — Telehealth: Payer: Self-pay | Admitting: Medical Oncology

## 2019-05-03 ENCOUNTER — Ambulatory Visit: Payer: Medicare Other

## 2019-05-03 ENCOUNTER — Inpatient Hospital Stay (HOSPITAL_COMMUNITY)
Admission: EM | Admit: 2019-05-03 | Discharge: 2019-05-05 | DRG: 689 | Disposition: A | Discharge status: Home Health | Payer: Medicare Other | Attending: Internal Medicine | Admitting: Internal Medicine

## 2019-05-03 ENCOUNTER — Other Ambulatory Visit: Payer: Self-pay

## 2019-05-03 DIAGNOSIS — C787 Secondary malignant neoplasm of liver and intrahepatic bile duct: Secondary | ICD-10-CM | POA: Diagnosis present

## 2019-05-03 DIAGNOSIS — Z85118 Personal history of other malignant neoplasm of bronchus and lung: Secondary | ICD-10-CM | POA: Diagnosis not present

## 2019-05-03 DIAGNOSIS — D649 Anemia, unspecified: Secondary | ICD-10-CM | POA: Diagnosis present

## 2019-05-03 DIAGNOSIS — T451X5A Adverse effect of antineoplastic and immunosuppressive drugs, initial encounter: Secondary | ICD-10-CM | POA: Diagnosis present

## 2019-05-03 DIAGNOSIS — Z1612 Extended spectrum beta lactamase (ESBL) resistance: Secondary | ICD-10-CM | POA: Diagnosis present

## 2019-05-03 DIAGNOSIS — N10 Acute tubulo-interstitial nephritis: Principal | ICD-10-CM | POA: Diagnosis present

## 2019-05-03 DIAGNOSIS — C3492 Malignant neoplasm of unspecified part of left bronchus or lung: Secondary | ICD-10-CM | POA: Diagnosis present

## 2019-05-03 DIAGNOSIS — Z881 Allergy status to other antibiotic agents status: Secondary | ICD-10-CM

## 2019-05-03 DIAGNOSIS — Z981 Arthrodesis status: Secondary | ICD-10-CM | POA: Diagnosis not present

## 2019-05-03 DIAGNOSIS — Z885 Allergy status to narcotic agent status: Secondary | ICD-10-CM

## 2019-05-03 DIAGNOSIS — Z87891 Personal history of nicotine dependence: Secondary | ICD-10-CM

## 2019-05-03 DIAGNOSIS — M797 Fibromyalgia: Secondary | ICD-10-CM | POA: Diagnosis present

## 2019-05-03 DIAGNOSIS — Z8249 Family history of ischemic heart disease and other diseases of the circulatory system: Secondary | ICD-10-CM

## 2019-05-03 DIAGNOSIS — J449 Chronic obstructive pulmonary disease, unspecified: Secondary | ICD-10-CM | POA: Diagnosis present

## 2019-05-03 DIAGNOSIS — C7951 Secondary malignant neoplasm of bone: Secondary | ICD-10-CM | POA: Diagnosis present

## 2019-05-03 DIAGNOSIS — J9621 Acute and chronic respiratory failure with hypoxia: Secondary | ICD-10-CM | POA: Diagnosis present

## 2019-05-03 DIAGNOSIS — D696 Thrombocytopenia, unspecified: Secondary | ICD-10-CM | POA: Diagnosis present

## 2019-05-03 DIAGNOSIS — Z1159 Encounter for screening for other viral diseases: Secondary | ICD-10-CM | POA: Diagnosis not present

## 2019-05-03 DIAGNOSIS — Z9049 Acquired absence of other specified parts of digestive tract: Secondary | ICD-10-CM

## 2019-05-03 DIAGNOSIS — Z888 Allergy status to other drugs, medicaments and biological substances status: Secondary | ICD-10-CM

## 2019-05-03 DIAGNOSIS — I10 Essential (primary) hypertension: Secondary | ICD-10-CM | POA: Diagnosis present

## 2019-05-03 DIAGNOSIS — Z20828 Contact with and (suspected) exposure to other viral communicable diseases: Secondary | ICD-10-CM | POA: Diagnosis present

## 2019-05-03 DIAGNOSIS — Z8719 Personal history of other diseases of the digestive system: Secondary | ICD-10-CM | POA: Diagnosis not present

## 2019-05-03 DIAGNOSIS — F419 Anxiety disorder, unspecified: Secondary | ICD-10-CM | POA: Diagnosis present

## 2019-05-03 DIAGNOSIS — Z9981 Dependence on supplemental oxygen: Secondary | ICD-10-CM

## 2019-05-03 DIAGNOSIS — Z8619 Personal history of other infectious and parasitic diseases: Secondary | ICD-10-CM

## 2019-05-03 DIAGNOSIS — Z9221 Personal history of antineoplastic chemotherapy: Secondary | ICD-10-CM

## 2019-05-03 DIAGNOSIS — B9620 Unspecified Escherichia coli [E. coli] as the cause of diseases classified elsewhere: Secondary | ICD-10-CM | POA: Diagnosis present

## 2019-05-03 DIAGNOSIS — N12 Tubulo-interstitial nephritis, not specified as acute or chronic: Secondary | ICD-10-CM | POA: Diagnosis present

## 2019-05-03 DIAGNOSIS — E876 Hypokalemia: Secondary | ICD-10-CM | POA: Diagnosis present

## 2019-05-03 DIAGNOSIS — D72819 Decreased white blood cell count, unspecified: Secondary | ICD-10-CM | POA: Diagnosis present

## 2019-05-03 DIAGNOSIS — K219 Gastro-esophageal reflux disease without esophagitis: Secondary | ICD-10-CM | POA: Diagnosis present

## 2019-05-03 DIAGNOSIS — E039 Hypothyroidism, unspecified: Secondary | ICD-10-CM | POA: Diagnosis present

## 2019-05-03 DIAGNOSIS — Z801 Family history of malignant neoplasm of trachea, bronchus and lung: Secondary | ICD-10-CM

## 2019-05-03 DIAGNOSIS — N390 Urinary tract infection, site not specified: Secondary | ICD-10-CM | POA: Diagnosis present

## 2019-05-03 DIAGNOSIS — Z79890 Hormone replacement therapy (postmenopausal): Secondary | ICD-10-CM

## 2019-05-03 DIAGNOSIS — Z833 Family history of diabetes mellitus: Secondary | ICD-10-CM

## 2019-05-03 DIAGNOSIS — Z79899 Other long term (current) drug therapy: Secondary | ICD-10-CM

## 2019-05-03 DIAGNOSIS — Z880 Allergy status to penicillin: Secondary | ICD-10-CM

## 2019-05-03 DIAGNOSIS — B9629 Other Escherichia coli [E. coli] as the cause of diseases classified elsewhere: Secondary | ICD-10-CM

## 2019-05-03 DIAGNOSIS — K589 Irritable bowel syndrome without diarrhea: Secondary | ICD-10-CM | POA: Diagnosis present

## 2019-05-03 DIAGNOSIS — D6481 Anemia due to antineoplastic chemotherapy: Secondary | ICD-10-CM | POA: Diagnosis present

## 2019-05-03 LAB — CBC WITH DIFFERENTIAL/PLATELET
Abs Immature Granulocytes: 0.03 10*3/uL (ref 0.00–0.07)
Basophils Absolute: 0 10*3/uL (ref 0.0–0.1)
Basophils Relative: 0 %
Eosinophils Absolute: 0.2 10*3/uL (ref 0.0–0.5)
Eosinophils Relative: 4 %
HCT: 27.4 % — ABNORMAL LOW (ref 36.0–46.0)
Hemoglobin: 9 g/dL — ABNORMAL LOW (ref 12.0–15.0)
Immature Granulocytes: 1 %
Lymphocytes Relative: 34 %
Lymphs Abs: 1.2 10*3/uL (ref 0.7–4.0)
MCH: 36.4 pg — ABNORMAL HIGH (ref 26.0–34.0)
MCHC: 32.8 g/dL (ref 30.0–36.0)
MCV: 110.9 fL — ABNORMAL HIGH (ref 80.0–100.0)
Monocytes Absolute: 0.4 10*3/uL (ref 0.1–1.0)
Monocytes Relative: 11 %
Neutro Abs: 1.8 10*3/uL (ref 1.7–7.7)
Neutrophils Relative %: 50 %
Platelets: 118 10*3/uL — ABNORMAL LOW (ref 150–400)
RBC: 2.47 MIL/uL — ABNORMAL LOW (ref 3.87–5.11)
RDW: 16.6 % — ABNORMAL HIGH (ref 11.5–15.5)
WBC: 3.5 10*3/uL — ABNORMAL LOW (ref 4.0–10.5)
nRBC: 0 % (ref 0.0–0.2)

## 2019-05-03 LAB — URINALYSIS, ROUTINE W REFLEX MICROSCOPIC
Bilirubin Urine: NEGATIVE
Glucose, UA: NEGATIVE mg/dL
Hgb urine dipstick: NEGATIVE
Ketones, ur: NEGATIVE mg/dL
Nitrite: POSITIVE — AB
Protein, ur: NEGATIVE mg/dL
Specific Gravity, Urine: 1.009 (ref 1.005–1.030)
pH: 6 (ref 5.0–8.0)

## 2019-05-03 LAB — BASIC METABOLIC PANEL
Anion gap: 10 (ref 5–15)
BUN: 12 mg/dL (ref 8–23)
CO2: 27 mmol/L (ref 22–32)
Calcium: 8.9 mg/dL (ref 8.9–10.3)
Chloride: 102 mmol/L (ref 98–111)
Creatinine, Ser: 1.09 mg/dL — ABNORMAL HIGH (ref 0.44–1.00)
GFR calc Af Amer: >60 mL/min (ref >60)
GFR calc non Af Amer: 53 mL/min — ABNORMAL LOW (ref >60)
Glucose, Bld: 98 mg/dL (ref 70–99)
Potassium: 3.3 mmol/L — ABNORMAL LOW (ref 3.5–5.1)
Sodium: 139 mmol/L (ref 135–145)

## 2019-05-03 LAB — SARS CORONAVIRUS 2 BY RT PCR (HOSPITAL ORDER, PERFORMED IN ~~LOC~~ HOSPITAL LAB): SARS Coronavirus 2: NEGATIVE

## 2019-05-03 MED ORDER — SODIUM CHLORIDE 0.9 % IV SOLN
1.0000 g | Freq: Once | INTRAVENOUS | Status: AC
  Administered 2019-05-03: 1 g via INTRAVENOUS
  Filled 2019-05-03: qty 1

## 2019-05-03 NOTE — ED Triage Notes (Signed)
Patient c/o dysuria that started Friday night. Per patient took 12 azo pills over the weekend with some improvement but still has discomfort with voiding. Patient has lung cancer and receives chemo and next dose is tomorrow at 8am. Per patient has had some nausea and vomiting after chemo last week.

## 2019-05-03 NOTE — H&P (Signed)
History and Physical:    Rebekah Henderson   BTD:[id number] DOB: [birth year] DOA: 05/03/2019  Referring MD/provider: PA Couture PCP: Redmond School, MD   Patient coming from: Home  Chief Complaint: Dysuria, nausea vomiting and diarrhea  History of Present Illness:   Rebekah Henderson is an 63 y.o. female with past medical history significant for small cell lung CA who is been getting chemotherapy for the past 3 years was in her usual state of health until 3 days prior to admission when she had an episode of nausea vomiting and diarrhea which she attributed to being secondary to her chemotherapy.  2 days prior to admission patient developed significant dysuria urgency and frequency.  She self treated with AZO with some good effect however this morning her dysuria was significantly worse and she felt tired and had some more nausea but no further vomiting or diarrhea.  Patient's past medical history significant for ESBL UTI.  Patient states this feels just like her previous episodes of UTI.  Patient denies any fevers or chills.  Does not know if she has malaise that she generally feels very worn down for secondary to her chemotherapy.  Patient admits to left lower quadrant pain as well as some left-sided back pain.  Patient denies hematemesis or melena or hematochezia.  Patient is not sure if she has any hematuria.  ED Course:  The patient was noted to have a positive UA and started on meropenem given known history of ESBL UTI.  Cultures and urine cultures were drawn and sent.  ROS:   ROS   Review of Systems: General: No fever, chills,  Skin: No rashes, lesions, wounds Eyes: no discharge, redness, pain HENT: no ear pain, hearing loss, drainage, tinnitus Respiratory: No shortness of breath, hemoptysis Cardiovascular: No palpitations, chest pain CNS: No numbness, dizziness, headache   Past Medical History:   Past Medical History:  Diagnosis Date  . Antineoplastic chemotherapy induced  anemia 12/03/2016  . Anxiety    takes Prozac daily  . Arthritis   . Benign fundic gland polyps of stomach   . Colon polyps   . COPD (chronic obstructive pulmonary disease) ([place])   . Dehydration 03/06/2017  . Diverticulitis   . Dyspnea    with exertion  . Encounter for antineoplastic chemotherapy 12/03/2016  . Fibromyalgia   . GERD (gastroesophageal reflux disease)    takes Pantoprazole daily  . Hypertension    takes Metoprolol,Triamterene-HCTZ,and Amlodipine daily  . Hypothyroidism    takes Synthroid daily  . IBS (irritable bowel syndrome)   . lung ca dx'd 10/02/2016   skin, lung  . PONV (postoperative nausea and vomiting)    pt also states that she had some difficulty breathing after cervical fusion    Past Surgical History:   Past Surgical History:  Procedure Laterality Date  . BIOPSY N/A 05/25/2013   Procedure: BIOPSIES (Random Colon; Duodenal; Gastric);  Surgeon: Danie Binder, MD;  Location: [facility];  Service: Endoscopy;  Laterality: N/A;  . BLADDER SUSPENSION    . BREAST ENHANCEMENT SURGERY    . BREAST IMPLANT REMOVAL    . CERVICAL FUSION  AUG 2013  . CHOLECYSTECTOMY  1999  . COLONOSCOPY  2007 [place]   POLYPS  . COLONOSCOPY WITH PROPOFOL N/A 05/25/2013   Procedure: COLONOSCOPY WITH PROPOFOL(at cecum 0957) total withdrawal time=66min);  Surgeon: Danie Binder, MD;  Location: [facility];  Service: Endoscopy;  Laterality: N/A;  . ESOPHAGOGASTRODUODENOSCOPY (EGD) WITH PROPOFOL N/A 05/25/2013   Procedure:  ESOPHAGOGASTRODUODENOSCOPY (EGD) WITH PROPOFOL;  Surgeon: Danie Binder, MD;  Location: [facility];  Service: Endoscopy;  Laterality: N/A;  . FOOT SURGERY    . IR FLUORO GUIDE PORT INSERTION RIGHT  03/04/2018  . IR US GUIDE VASC ACCESS RIGHT  03/04/2018  . POLYPECTOMY N/A 05/25/2013   Procedure: POLYPECTOMY (Rectal and Gastric);  Surgeon: Danie Binder, MD;  Location: [facility];  Service: Endoscopy;  Laterality: N/A;  . TONSILLECTOMY    . UPPER GASTROINTESTINAL ENDOSCOPY    . VIDEO  BRONCHOSCOPY WITH ENDOBRONCHIAL ULTRASOUND  09/12/2016   Procedure: VIDEO BRONCHOSCOPY WITH ENDOBRONCHIAL ULTRASOUND AND BIOPSY;  Surgeon: Juanito Doom, MD;  Location: [facility];  Service: Cardiopulmonary;;    Social History:   Social History   Socioeconomic History  . Marital status: Divorced    Spouse name: Not on file  . Number of children: 1  . Years of education: College   . Highest education level: Not on file  Occupational History  . Occupation: Retired   Scientific laboratory technician  . Financial resource strain: Not on file  . Food insecurity:    Worry: Not on file    Inability: Not on file  . Transportation needs:    Medical: Yes    Non-medical: Yes  Tobacco Use  . Smoking status: Former Smoker    Packs/day: 2.00    Years: 20.00    Pack years: 40.00    Last attempt to quit: 05/20/2004    Years since quitting: 14.9  . Smokeless tobacco: Never Used  Substance and Sexual Activity  . Alcohol use: No  . Drug use: No  . Sexual activity: Not on file  Lifestyle  . Physical activity:    Days per week: Not on file    Minutes per session: Not on file  . Stress: Not on file  Relationships  . Social connections:    Talks on phone: Not on file    Gets together: Not on file    Attends religious service: Not on file    Active member of club or organization: Not on file    Attends meetings of clubs or organizations: Not on file    Relationship status: Not on file  . Intimate partner violence:    Fear of current or ex partner: Not on file    Emotionally abused: Not on file    Physically abused: Not on file    Forced sexual activity: Not on file  Other Topics Concern  . Not on file  Social History Narrative   Lives alone.  Retired from [employer]; Codeine; Fentanyl; Ranitidine hcl; Keflex [cephalexin]; and Lyrica [pregabalin]  Family history:   Family History  Problem Relation Age of Onset  . Breast cancer Mother   . Diabetes Maternal  Grandfather   . Lung cancer Father   . Heart failure Sister 41       Died. Morbidly obese  . Colon cancer Neg Hx   . Colon polyps Neg Hx     Current Medications:   Prior to Admission medications   Medication Sig Start Date End Date Taking? Authorizing Provider  albuterol (PROVENTIL HFA;VENTOLIN HFA) 108 (90 Base) MCG/ACT inhaler Inhale 2 puffs into the lungs every 4 (four) hours as needed for wheezing or shortness of breath.   Yes [provider]  ALPRAZolam (XANAX) 0.25 MG tablet Take 2 tablets (0.5 mg total) by mouth at bedtime as needed for anxiety. Patient taking differently: Take  0.25-0.5 mg by mouth daily as needed for anxiety or sleep.  02/11/18  Yes Curt Bears, MD  amLODipine (NORVASC) 5 MG tablet Take 5 mg by mouth daily.     Yes [provider]  cyclobenzaprine (FLEXERIL) 5 MG tablet TAKE ONE TABLET 3 TIMES A DAY AS NEEDED. Patient taking differently: Take 5 mg by mouth 3 (three) times daily as needed for muscle spasms.  03/31/19  Yes Tanner, Lyndon Code., PA-C  dicyclomine (BENTYL) 20 MG tablet Take 20 mg by mouth 3 (three) times daily as needed for spasms.   Yes [provider]  diphenoxylate-atropine (LOMOTIL) 2.5-0.025 MG tablet TAKE 1 TABLET BY MOUTH 4 TIMES DAILY AS NEEDED FOR LOOSE STOOLS. Patient taking differently: Take 1 tablet by mouth 4 (four) times daily as needed for diarrhea or loose stools.  04/20/19  Yes Heilingoetter, Cassandra L, PA-C  estazolam (PROSOM) 2 MG tablet Take 2 mg by mouth at bedtime.   Yes [provider]  estradiol (ESTRACE) 2 MG tablet Take 2 mg by mouth daily.     Yes [provider]  FLUoxetine (PROZAC) 40 MG capsule Take 40 mg by mouth daily.    Yes [provider]  HYDROcodone-acetaminophen (NORCO/VICODIN) 5-325 MG tablet Take 1 tablet by mouth every 4 (four) hours as needed for moderate pain.    Yes [provider]  levothyroxine (SYNTHROID, LEVOTHROID) 50 MCG tablet Take 50 mcg by  mouth daily before breakfast.    Yes [provider]  lidocaine-prilocaine (EMLA) cream Apply 1 application topically as needed. To numb skin over port a cath: Squeeze a  small amount on cotton ball and place over port site 1-2 hours prior to chemotherapy. 02/18/18  Yes Curt Bears, MD  loperamide (IMODIUM) 2 MG capsule Take 2 mg by mouth every 2 (two) hours as needed for diarrhea or loose stools.   Yes [provider]  magnesium oxide (MAG-OX) 400 (241.3 Mg) MG tablet Take 1 tablet (400 mg total) by mouth 2 (two) times daily. 11/12/18  Yes Curcio, Roselie Awkward, NP  metoprolol succinate (TOPROL-XL) 25 MG 24 hr tablet Take 25 mg by mouth daily.   Yes [provider]  ondansetron (ZOFRAN-ODT) 4 MG disintegrating tablet Dissolve one tablet by mouth every 8 hours as needed for nausea and vomiting, 02/22/19  Yes Curt Bears, MD  OXYGEN Inhale 2 L into the lungs daily as needed (for shortness of breath).   Yes [provider]  pantoprazole (PROTONIX) 40 MG tablet Take 40 mg by mouth 2 (two) times daily before a meal.    Yes [provider]  phenazopyridine (AZO-TABS) 95 MG tablet Take 190 mg by mouth 2 (two) times a day.   Yes [provider]  potassium chloride SA (K-DUR,KLOR-CON) 20 MEQ tablet Take 20 mEq by mouth daily as needed (for supplement).   Yes [provider]  promethazine (PHENERGAN) 25 MG tablet TAKE ONE TABLET BY MOUTH EVERY 6 HOURS AS NEEDED. Patient taking differently: Take 25 mg by mouth every 6 (six) hours as needed for nausea or vomiting.  12/18/18  Yes Curt Bears, MD  senna-docusate (SENOKOT-S) 8.6-50 MG tablet Take 1 tablet by mouth daily as needed for mild constipation or moderate constipation.  07/17/12  Yes [provider]  ciprofloxacin (CIPRO) 500 MG tablet Take 1 tablet (500 mg total) by mouth 2 (two) times daily. Patient not taking: Reported on 05/03/2019 04/07/19   Curt Bears, MD    Physical  Exam:  Vitals:   05/03/19 1631 05/03/19 1915 05/03/19 2030 05/03/19 2130  BP:  (!) 129/95 132/70 112/66  Pulse:  80 78 80  Resp:  17 17 18   Temp:      TempSrc:      SpO2:  92% 96% 90%  Weight: 68 kg     Height: 5\' 4"  (1.626 m)        Physical Exam: Blood pressure 112/66, pulse 80, temperature 97.6 F (36.4 C), temperature source Oral, resp. rate 18, height 5\' 4"  (1.626 m), weight 68 kg, SpO2 90 %. Gen: Chronically ill-appearing very pale patient sitting up in bed in no acute distress. Eyes: Sclerae anicteric. Conjunctiva mildly injected. Neck: Supple, no jugular venous distention. Chest: Moderately good air entry bilaterally with no adventitious sounds.  CV: Distant, regular, no audible murmurs. Abdomen: NABS, soft, nondistended, patient has mild tenderness to deep palpation in her LLQ.  Patient does have left-sided CVA tenderness. Extremities: No edema.  Skin: Warm and dry. No rashes, lesions or wounds. Neuro: Alert and oriented times 3; grossly nonfocal. Psych: Patient is cooperative, logical and coherent with appropriate mood and affect.  Data Review:    Labs: Basic Metabolic Panel: Recent Labs  Lab 04/27/19 0812 05/03/19 1855  NA 141 139  K 3.9 3.3*  CL 106 102  CO2 26 27  GLUCOSE 90 98  BUN 11 12  CREATININE 0.90 1.09*  CALCIUM 8.0* 8.9  MG 1.2*  --    Liver Function Tests: Recent Labs  Lab 04/27/19 0812  AST 10*  ALT 6  ALKPHOS 53  BILITOT 0.4  PROT 6.0*  ALBUMIN 3.4*   No results for input(s): LIPASE, AMYLASE in the last 168 hours. No results for input(s): AMMONIA in the last 168 hours. CBC: Recent Labs  Lab 04/27/19 0812 05/03/19 1855  WBC 3.0* 3.5*  NEUTROABS 1.0* 1.8  HGB 8.0* 9.0*  HCT 23.9* 27.4*  MCV 109.6* 110.9*  PLT 76* 118*   Cardiac Enzymes: No results for input(s): CKTOTAL, CKMB, CKMBINDEX, TROPONINI in the last 168 hours.  BNP (last 3 results) No results for input(s): PROBNP in the last 8760 hours. CBG: No results for  input(s): GLUCAP in the last 168 hours.  Urinalysis    Component Value Date/Time   COLORURINE AMBER (A) 05/03/2019 1642   APPEARANCEUR HAZY (A) 05/03/2019 1642   LABSPEC 1.009 05/03/2019 1642   PHURINE 6.0 05/03/2019 1642   GLUCOSEU NEGATIVE 05/03/2019 1642   HGBUR NEGATIVE 05/03/2019 1642   BILIRUBINUR NEGATIVE 05/03/2019 1642   KETONESUR NEGATIVE 05/03/2019 1642   PROTEINUR NEGATIVE 05/03/2019 1642   UROBILINOGEN 0.2 03/10/2014 1027   NITRITE POSITIVE (A) 05/03/2019 1642   LEUKOCYTESUR TRACE (A) 05/03/2019 1642      Radiographic Studies: No results found.  EKG: Ordered and pending  Assessment/Plan:   Principal Problem:   Acute pyelonephritis Active Problems:   COPD (chronic obstructive pulmonary disease) ([facility])   Hypothyroidism   Anxiety   Small cell lung cancer, left ([facility])   Anemia  63 year old female in immunocompromise state secondary to ongoing chemotherapy for small cell lung CA presents with acute pyelonephritis.  She has history of ESBL and has been started on meropenem.  ACUTE PYELONEPHRITIS WITH IMMUNOCOMPROMISED STATE  Patient with prior history of ESBL UTI now presents with signs and symptoms of pyelonephritis Patient is an immunocompromise state secondary to ongoing chemotherapy, she is due for dose of chemotherapy tomorrow with Dr. Julien Nordmann at [facility]. Will treat with meropenem Urine culture and blood cultures  are pending.  HYPOKALEMIA Will replete and recheck  HTN Will hold amlodipine until pyelonephritis is under better control Continue metoprolol per home doses  ANXIETY Continue Prozac, Xanax per home doses  HYPOTHYROIDISM Continue Synthroid  SCLCA Ongoing treatment per Dr. Julien Nordmann at [facility]      Other information:   DVT prophylaxis: Lovenox ordered. Code Status: Full code. Family Communication: Patient states she has no family, depends on her friends who know she is in house Disposition Plan: [facility] called: None  Admission status: Inpatient   The medical decision making is of moderate complexity, therefore this is a level 2 visit.  Dewaine Oats Tublu Chatterjee [facility]  If 7PM-7AM, please contact night-coverage [email or web address] Password [facility] 05/03/2019, 10:17 PM

## 2019-05-03 NOTE — ED Notes (Signed)
Pt tolerating PO challenge with no problems

## 2019-05-03 NOTE — Telephone Encounter (Signed)
UTI symptoms--Dysuria,  weak ,nauseated, diarrhea "Pain in my side" since Saturday . She took 12 AZO since Saturday. I instructed Lorae to have someone drive her to [facility] ED or call 911 now. She said she will call her friend to take her.

## 2019-05-03 NOTE — ED Provider Notes (Signed)
[facility] EMERGENCY DEPARTMENT Provider Note   CSN: [id number] Arrival date & time: 05/03/19  1553    History   Chief Complaint Chief Complaint  Patient presents with  . Dysuria    HPI Rebekah Henderson is a 63 y.o. female.     HPI   63 year old female with a history of metastatic small cell lung cancer (on chemo), COPD, diverticulitis, GERD, hypertension, hypothyroidism, IBS, who presents the emergency department today for evaluation of dysuria that started about 2-3 days ago. She also reports frequency and urgency. She denies fevers. States she had NVD 2 days ago that has resolved.  She tried Azo at home which improved some of her urinary symptoms.  Reports mid back pain ongoing for several months, unchanged today. States oncologist aware and that she has mets to her bones. No new numbness/weakness. No unilateral back pain. No abd pain.   Past Medical History:  Diagnosis Date  . 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 Anxiety    takes Prozac daily  . Arthritis   . Benign fundic gland polyps of stomach   . Colon polyps   . COPD (chronic obstructive pulmonary disease) ([place])   . Dehydration 03/06/2017  . Diverticulitis   . Dyspnea    with exertion  . Encounter for antineoplastic chemotherapy 12/03/2016  . Fibromyalgia   . GERD (gastroesophageal reflux disease)    takes Pantoprazole daily  . Hypertension    takes Metoprolol,Triamterene-HCTZ,and Amlodipine daily  . Hypothyroidism    takes Synthroid daily  . IBS (irritable bowel syndrome)   . lung ca dx'd 10/02/2016   skin, lung  . PONV (postoperative nausea and vomiting)    pt also states that she had some difficulty breathing after cervical fusion    Patient Active Problem List   Diagnosis Date Noted  . Nausea without vomiting 02/24/2019  . UTI due to extended-spectrum beta lactamase (ESBL) producing Escherichia coli 01/08/2019  . Viral gastroenteritis 01/08/2019  . Gastroenteritis 01/06/2019  . Goals  of care, counseling/discussion 06/30/2018  . Encounter for antineoplastic immunotherapy 03/10/2018  . Small cell carcinoma of upper lobe of left lung ([place]) 02/11/2018  . Acute on chronic respiratory failure with hypoxia ([place]) 03/21/2017  . Mild protein-calorie malnutrition ([place]) 03/21/2017  . HCAP (healthcare-associated pneumonia) 03/20/2017  . Dehydration 03/06/2017  . Anemia 01/04/2017  . Thrombocytopenia ([place]) 01/04/2017  . Influenza A 12/10/2016  . SIRS (systemic inflammatory response syndrome) ([place]) 12/07/2016  . Hypoxia 12/07/2016  . Bronchitis 12/07/2016  . Encounter for antineoplastic chemotherapy 12/03/2016  . 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 Protein-calorie malnutrition, severe 10/30/2016  . Vomiting 10/29/2016  . Abdominal pain 10/29/2016  . Rash and nonspecific skin eruption 10/29/2016  . Neutropenic fever ([place]) 10/15/2016  . Intractable nausea and vomiting 10/15/2016  . Pancytopenia due to antineoplastic chemotherapy ([place]) 10/15/2016  . Nodular rash 10/15/2016  . Hypokalemia 10/15/2016  . Hypomagnesemia 10/15/2016  . Chronic respiratory failure with hypoxia ([place]) 10/15/2016  . Small cell lung cancer, left ([place]) 09/19/2016  . Mediastinal mass 09/10/2016  . COPD (chronic obstructive pulmonary disease) ([place])   . Hypertension   . Fibromyalgia   . IBS (irritable bowel syndrome)   . GERD (gastroesophageal reflux disease)   . Hypothyroidism   . Colon polyps   . Anxiety   . PONV (postoperative nausea and vomiting)   . Diarrhea 05/12/2013    Past Surgical History:  Procedure Laterality Date  . BIOPSY N/A 05/25/2013   Procedure: BIOPSIES (Random Colon; Duodenal;  Gastric);  Surgeon: Danie Binder, MD;  Location: [facility];  Service: Endoscopy;  Laterality: N/A;  . BLADDER SUSPENSION    . BREAST ENHANCEMENT SURGERY    . BREAST IMPLANT REMOVAL    . CERVICAL FUSION  AUG 2013  . CHOLECYSTECTOMY  1999  . COLONOSCOPY  2007    POLYPS  .  COLONOSCOPY WITH PROPOFOL N/A 05/25/2013   Procedure: COLONOSCOPY WITH PROPOFOL(at cecum 0957) total withdrawal time=79min);  Surgeon: Danie Binder, MD;  Location: [facility];  Service: Endoscopy;  Laterality: N/A;  . ESOPHAGOGASTRODUODENOSCOPY (EGD) WITH PROPOFOL N/A 05/25/2013   Procedure: ESOPHAGOGASTRODUODENOSCOPY (EGD) WITH PROPOFOL;  Surgeon: Danie Binder, MD;  Location: [facility];  Service: Endoscopy;  Laterality: N/A;  . FOOT SURGERY    . IR FLUORO GUIDE PORT INSERTION RIGHT  03/04/2018  . IR US GUIDE VASC ACCESS RIGHT  03/04/2018  . POLYPECTOMY N/A 05/25/2013   Procedure: POLYPECTOMY (Rectal and Gastric);  Surgeon: Danie Binder, MD;  Location: [facility];  Service: Endoscopy;  Laterality: N/A;  . TONSILLECTOMY    . UPPER GASTROINTESTINAL ENDOSCOPY    . VIDEO BRONCHOSCOPY WITH ENDOBRONCHIAL ULTRASOUND  09/12/2016   Procedure: VIDEO BRONCHOSCOPY WITH ENDOBRONCHIAL ULTRASOUND AND BIOPSY;  Surgeon: Juanito Doom, MD;  Location: [facility];  Service: Cardiopulmonary;;     OB History    Gravida  1   Para  1   Term  1   Preterm      AB      Living        SAB      TAB      Ectopic      Multiple      Live Births               Home Medications    Prior to Admission medications   Medication Sig Start Date End Date Taking? Authorizing Provider  albuterol (PROVENTIL HFA;VENTOLIN HFA) 108 (90 Base) MCG/ACT inhaler Inhale 2 puffs into the lungs every 4 (four) hours as needed for wheezing or shortness of breath.   Yes [provider]  ALPRAZolam (XANAX) 0.25 MG tablet Take 2 tablets (0.5 mg total) by mouth at bedtime as needed for anxiety. Patient taking differently: Take 0.25-0.5 mg by mouth daily as needed for anxiety or sleep.  02/11/18  Yes Curt Bears, MD  amLODipine (NORVASC) 5 MG tablet Take 5 mg by mouth daily.     Yes [provider]  cyclobenzaprine (FLEXERIL) 5 MG tablet TAKE ONE TABLET 3 TIMES A DAY AS NEEDED. Patient taking differently: Take 5 mg  by mouth 3 (three) times daily as needed for muscle spasms.  03/31/19  Yes Tanner, Lyndon Code., PA-C  dicyclomine (BENTYL) 20 MG tablet Take 20 mg by mouth 3 (three) times daily as needed for spasms.   Yes [provider]  OXYGEN Inhale 2 L into the lungs daily as needed (for shortness of breath).   Yes [provider]  phenazopyridine (AZO-TABS) 95 MG tablet Take 190 mg by mouth 2 (two) times a day.   Yes [provider]  ciprofloxacin (CIPRO) 500 MG tablet Take 1 tablet (500 mg total) by mouth 2 (two) times daily. Patient not taking: Reported on 05/03/2019 04/07/19   Curt Bears, MD  diphenoxylate-atropine (LOMOTIL) 2.5-0.025 MG tablet TAKE 1 TABLET BY MOUTH 4 TIMES DAILY AS NEEDED FOR LOOSE STOOLS. Patient taking differently: Take 1 tablet by mouth 4 (four) times daily as needed for diarrhea or loose stools.  04/20/19   Heilingoetter, Cassandra L, PA-C  estazolam (PROSOM) 2 MG tablet Take 2 mg by mouth at bedtime.    [provider]  estradiol (ESTRACE) 2 MG tablet Take 2 mg by mouth daily.      [provider]  FLUoxetine (PROZAC) 40 MG capsule Take 40 mg by mouth daily.     [provider]  HYDROcodone-acetaminophen (NORCO/VICODIN) 5-325 MG tablet Take 1 tablet by mouth every 4 (four) hours as needed for moderate pain.     [provider]  levothyroxine (SYNTHROID, LEVOTHROID) 50 MCG tablet Take 50 mcg by mouth daily before breakfast.     [provider]  lidocaine-prilocaine (EMLA) cream Apply 1 application topically as needed. To numb skin over port a cath: Squeeze a  small amount on cotton ball and place over port site 1-2 hours prior to chemotherapy. 02/18/18   Curt Bears, MD  loperamide (IMODIUM) 2 MG capsule Take 2 mg by mouth every 2 (two) hours as needed for diarrhea or loose stools.    [provider]  magnesium oxide (MAG-OX) 400 (241.3 Mg) MG tablet Take 1 tablet (400 mg total) by mouth 2 (two) times daily.  11/12/18   Maryanna Shape, NP  metoprolol succinate (TOPROL-XL) 25 MG 24 hr tablet Take 25 mg by mouth daily.    [provider]  nitrofurantoin, macrocrystal-monohydrate, (MACROBID) 100 MG capsule Take 1 capsule (100 mg total) by mouth 2 (two) times daily. 01/08/19   Kathie Dike, MD  ondansetron (ZOFRAN-ODT) 4 MG disintegrating tablet Dissolve one tablet by mouth every 8 hours as needed for nausea and vomiting, 02/22/19   Curt Bears, MD  pantoprazole (PROTONIX) 40 MG tablet Take 40 mg by mouth 2 (two) times daily before a meal.     [provider]  potassium chloride SA (K-DUR,KLOR-CON) 20 MEQ tablet Take 20 mEq by mouth daily as needed (for supplement).    [provider]  promethazine (PHENERGAN) 25 MG tablet TAKE ONE TABLET BY MOUTH EVERY 6 HOURS AS NEEDED. Patient taking differently: Take 25 mg by mouth every 6 (six) hours as needed for nausea or vomiting.  12/18/18   Curt Bears, MD  senna-docusate (SENOKOT-S) 8.6-50 MG tablet Take 1 tablet by mouth daily as needed for mild constipation or moderate constipation.  07/17/12   [provider]    Family History Family History  Problem Relation Age of Onset  . Breast cancer Mother   . Diabetes Maternal Grandfather   . Lung cancer Father   . Heart failure Sister 79       Died. Morbidly obese  . Colon cancer Neg Hx   . Colon polyps Neg Hx     Social History Social History   Tobacco Use  . Smoking status: Former Smoker    Packs/day: 2.00    Years: 20.00    Pack years: 40.00    Last attempt to quit: 05/20/2004    Years since quitting: 14.9  . Smokeless tobacco: Never Used  Substance Use Topics  . Alcohol use: No  . Drug use: No     Allergies   Penicillins; Codeine; Fentanyl; Ranitidine hcl; Keflex [cephalexin]; and Lyrica [pregabalin]   Review of Systems Review of Systems  Constitutional: Negative for chills and fever.  HENT: Negative for ear pain and sore throat.   Eyes:  Negative for visual disturbance.  Respiratory: Negative for cough and shortness of breath.   Cardiovascular: Negative for chest pain.  Gastrointestinal: Positive for diarrhea (  resolved), nausea and vomiting (resolved). Negative for abdominal pain.  Genitourinary: Positive for dysuria, frequency and urgency. Negative for flank pain.  Musculoskeletal: Negative for arthralgias and back pain.  Skin: Negative for color change and rash.  Neurological: Negative for headaches.  All other systems reviewed and are negative.    Physical Exam Updated Vital Signs BP 132/70   Pulse 78   Temp 97.6 F (36.4 C) (Oral)   Resp 17   Ht 5\' 4"  (1.626 m)   Wt 68 kg   SpO2 96%   BMI 25.75 kg/m   Physical Exam Vitals signs and nursing note reviewed.  Constitutional:      General: She is not in acute distress.    Appearance: She is well-developed. She is not ill-appearing or toxic-appearing.  HENT:     Head: Normocephalic and atraumatic.  Eyes:     Conjunctiva/sclera: Conjunctivae normal.  Neck:     Musculoskeletal: Neck supple.  Cardiovascular:     Rate and Rhythm: Normal rate and regular rhythm.     Heart sounds: Normal heart sounds. No murmur.  Pulmonary:     Effort: Pulmonary effort is normal. No respiratory distress.     Breath sounds: Wheezing (rare) present. No rhonchi or rales.  Abdominal:     General: Bowel sounds are normal.     Palpations: Abdomen is soft.     Tenderness: There is no abdominal tenderness. There is right CVA tenderness (minimal). There is no left CVA tenderness, guarding or rebound.  Skin:    General: Skin is warm and dry.  Neurological:     Mental Status: She is alert.     ED Treatments / Results  Labs (all labs ordered are listed, but only abnormal results are displayed) Labs Reviewed  URINALYSIS, ROUTINE W REFLEX MICROSCOPIC - Abnormal; Notable for the following components:      Result Value   Color, Urine AMBER (*)    APPearance HAZY (*)    Nitrite  POSITIVE (*)    Leukocytes,Ua TRACE (*)    Bacteria, UA RARE (*)    All other components within normal limits  CBC WITH DIFFERENTIAL/PLATELET - Abnormal; Notable for the following components:   WBC 3.5 (*)    RBC 2.47 (*)    Hemoglobin 9.0 (*)    HCT 27.4 (*)    MCV 110.9 (*)    MCH 36.4 (*)    RDW 16.6 (*)    Platelets 118 (*)    All other components within normal limits  BASIC METABOLIC PANEL - Abnormal; Notable for the following components:   Potassium 3.3 (*)    Creatinine, Ser 1.09 (*)    GFR calc non Af Amer 53 (*)    All other components within normal limits  URINE CULTURE  SARS CORONAVIRUS 2 (HOSPITAL ORDER, [place] [facility])    EKG None  Radiology No results found.  Procedures Procedures (including critical care time)  Medications Ordered in ED Medications  meropenem (MERREM) 1 g in sodium chloride 0.9 % 100 mL IVPB (1 g Intravenous New Bag/Given 05/03/19 2027)     Initial Impression / Assessment and Plan / ED Course  I have reviewed the triage vital signs and the nursing notes.  Pertinent labs & imaging results that were available during my care of the patient were reviewed by me and considered in my medical decision making (see chart for details).   Final Clinical Impressions(s) / ED Diagnoses   Final diagnoses:  UTI  due to extended-spectrum beta lactamase (ESBL) producing Escherichia coli   63 year old female with history of lung cancer with mets presents the emergency department today complaining of dysuria frequency and urgency that began about 3 to 4 days ago.  Symptoms have improved with Azo at home.  Denies fevers, flank pain or persistent vomiting.  On arrival to the ED patient afebrile and overall with reassuring vital signs.  She did have low O2 sat on arrival as she was not using her oxygen.  She is in no respiratory distress.  She is nontoxic, nonseptic appearing.  CBC with leukopenia that appears to be uptrending from  prior labs.  She also has anemia which appears to be improving from a week ago.  Thrombocytopenia is also present but improving. BMP with mild hypokalemia, slight increase in creatinine at 1.09 up from 0.9. UA with leukocytes and nitrites.  Also with 0-5 RBCs, 11-20 WBCs and rare bacteria.  We will send culture.  Reviewed urine cultures from the past, and on 01/06/2019 he had culture which showed ESBL. The only oral medication that this is sensitive to is macrobid.  7:54 PM Contacted pharmacist, Aleene Davidson who does not recommend giving macrobid for pts UTI given her prior culture result.  Discussed case with Dr. Sedonia Small who is in agreement with the plan.  8:55 PM CONSULT with Dr Esmond Plants who accepts pt for admission.   ED Discharge Orders    None       Bishop [place] 05/03/19 2057    Maudie Flakes, MD 05/08/19 [phone]

## 2019-05-04 ENCOUNTER — Other Ambulatory Visit: Payer: Self-pay

## 2019-05-04 ENCOUNTER — Encounter (HOSPITAL_COMMUNITY): Payer: Self-pay

## 2019-05-04 ENCOUNTER — Inpatient Hospital Stay: Payer: Medicare Other

## 2019-05-04 ENCOUNTER — Inpatient Hospital Stay: Payer: Medicare Other | Attending: Internal Medicine

## 2019-05-04 DIAGNOSIS — J188 Other pneumonia, unspecified organism: Secondary | ICD-10-CM | POA: Insufficient documentation

## 2019-05-04 DIAGNOSIS — C3432 Malignant neoplasm of lower lobe, left bronchus or lung: Secondary | ICD-10-CM | POA: Insufficient documentation

## 2019-05-04 DIAGNOSIS — R222 Localized swelling, mass and lump, trunk: Secondary | ICD-10-CM | POA: Insufficient documentation

## 2019-05-04 DIAGNOSIS — R0609 Other forms of dyspnea: Secondary | ICD-10-CM | POA: Insufficient documentation

## 2019-05-04 DIAGNOSIS — R634 Abnormal weight loss: Secondary | ICD-10-CM | POA: Insufficient documentation

## 2019-05-04 DIAGNOSIS — R5383 Other fatigue: Secondary | ICD-10-CM | POA: Insufficient documentation

## 2019-05-04 DIAGNOSIS — Z9221 Personal history of antineoplastic chemotherapy: Secondary | ICD-10-CM | POA: Insufficient documentation

## 2019-05-04 DIAGNOSIS — Z885 Allergy status to narcotic agent status: Secondary | ICD-10-CM | POA: Insufficient documentation

## 2019-05-04 DIAGNOSIS — Z8673 Personal history of transient ischemic attack (TIA), and cerebral infarction without residual deficits: Secondary | ICD-10-CM | POA: Insufficient documentation

## 2019-05-04 DIAGNOSIS — I10 Essential (primary) hypertension: Secondary | ICD-10-CM | POA: Insufficient documentation

## 2019-05-04 DIAGNOSIS — K5730 Diverticulosis of large intestine without perforation or abscess without bleeding: Secondary | ICD-10-CM | POA: Insufficient documentation

## 2019-05-04 DIAGNOSIS — N390 Urinary tract infection, site not specified: Secondary | ICD-10-CM | POA: Insufficient documentation

## 2019-05-04 DIAGNOSIS — J439 Emphysema, unspecified: Secondary | ICD-10-CM | POA: Insufficient documentation

## 2019-05-04 DIAGNOSIS — J90 Pleural effusion, not elsewhere classified: Secondary | ICD-10-CM | POA: Insufficient documentation

## 2019-05-04 DIAGNOSIS — Z881 Allergy status to other antibiotic agents status: Secondary | ICD-10-CM | POA: Insufficient documentation

## 2019-05-04 DIAGNOSIS — Z9049 Acquired absence of other specified parts of digestive tract: Secondary | ICD-10-CM | POA: Insufficient documentation

## 2019-05-04 DIAGNOSIS — Z86010 Personal history of colonic polyps: Secondary | ICD-10-CM | POA: Insufficient documentation

## 2019-05-04 DIAGNOSIS — J9811 Atelectasis: Secondary | ICD-10-CM | POA: Insufficient documentation

## 2019-05-04 DIAGNOSIS — T451X5A Adverse effect of antineoplastic and immunosuppressive drugs, initial encounter: Secondary | ICD-10-CM | POA: Insufficient documentation

## 2019-05-04 DIAGNOSIS — R51 Headache: Secondary | ICD-10-CM | POA: Insufficient documentation

## 2019-05-04 DIAGNOSIS — E876 Hypokalemia: Secondary | ICD-10-CM | POA: Insufficient documentation

## 2019-05-04 DIAGNOSIS — Z880 Allergy status to penicillin: Secondary | ICD-10-CM | POA: Insufficient documentation

## 2019-05-04 DIAGNOSIS — D6481 Anemia due to antineoplastic chemotherapy: Secondary | ICD-10-CM | POA: Insufficient documentation

## 2019-05-04 DIAGNOSIS — E039 Hypothyroidism, unspecified: Secondary | ICD-10-CM | POA: Insufficient documentation

## 2019-05-04 DIAGNOSIS — M6281 Muscle weakness (generalized): Secondary | ICD-10-CM | POA: Insufficient documentation

## 2019-05-04 DIAGNOSIS — Z79899 Other long term (current) drug therapy: Secondary | ICD-10-CM | POA: Insufficient documentation

## 2019-05-04 DIAGNOSIS — C771 Secondary and unspecified malignant neoplasm of intrathoracic lymph nodes: Secondary | ICD-10-CM | POA: Insufficient documentation

## 2019-05-04 LAB — BASIC METABOLIC PANEL
Anion gap: 12 (ref 5–15)
BUN: 10 mg/dL (ref 8–23)
CO2: 25 mmol/L (ref 22–32)
Calcium: 8.9 mg/dL (ref 8.9–10.3)
Chloride: 105 mmol/L (ref 98–111)
Creatinine, Ser: 1.04 mg/dL — ABNORMAL HIGH (ref 0.44–1.00)
GFR calc Af Amer: >60 mL/min (ref >60)
GFR calc non Af Amer: 56 mL/min — ABNORMAL LOW (ref >60)
Glucose, Bld: 87 mg/dL (ref 70–99)
Potassium: 4.4 mmol/L (ref 3.5–5.1)
Sodium: 142 mmol/L (ref 135–145)

## 2019-05-04 LAB — CBC
HCT: 28.5 % — ABNORMAL LOW (ref 36.0–46.0)
Hemoglobin: 9.3 g/dL — ABNORMAL LOW (ref 12.0–15.0)
MCH: 35.8 pg — ABNORMAL HIGH (ref 26.0–34.0)
MCHC: 32.6 g/dL (ref 30.0–36.0)
MCV: 109.6 fL — ABNORMAL HIGH (ref 80.0–100.0)
Platelets: 116 10*3/uL — ABNORMAL LOW (ref 150–400)
RBC: 2.6 MIL/uL — ABNORMAL LOW (ref 3.87–5.11)
RDW: 16.3 % — ABNORMAL HIGH (ref 11.5–15.5)
WBC: 3.7 10*3/uL — ABNORMAL LOW (ref 4.0–10.5)
nRBC: 0 % (ref 0.0–0.2)

## 2019-05-04 MED ORDER — SENNOSIDES-DOCUSATE SODIUM 8.6-50 MG PO TABS
1.0000 | ORAL_TABLET | Freq: Every day | ORAL | Status: DC | PRN
  Administered 2019-05-04: 1 via ORAL
  Filled 2019-05-04 (×2): qty 1

## 2019-05-04 MED ORDER — SODIUM CHLORIDE 0.9 % IV SOLN
1.0000 g | Freq: Three times a day (TID) | INTRAVENOUS | Status: DC
  Administered 2019-05-04 – 2019-05-05 (×4): 1 g via INTRAVENOUS
  Filled 2019-05-04 (×4): qty 1

## 2019-05-04 MED ORDER — ALBUTEROL SULFATE (2.5 MG/3ML) 0.083% IN NEBU: 2.5000 mg | INHALATION_SOLUTION | RESPIRATORY_TRACT | Status: DC | PRN

## 2019-05-04 MED ORDER — PROMETHAZINE HCL 12.5 MG PO TABS
25.0000 mg | ORAL_TABLET | Freq: Four times a day (QID) | ORAL | Status: DC | PRN
  Administered 2019-05-05: 25 mg via ORAL
  Filled 2019-05-04: qty 2

## 2019-05-04 MED ORDER — PANTOPRAZOLE SODIUM 40 MG PO TBEC
40.0000 mg | DELAYED_RELEASE_TABLET | Freq: Two times a day (BID) | ORAL | Status: DC
  Administered 2019-05-04 – 2019-05-05 (×4): 40 mg via ORAL
  Filled 2019-05-04 (×4): qty 1

## 2019-05-04 MED ORDER — CYCLOBENZAPRINE HCL 10 MG PO TABS
5.0000 mg | ORAL_TABLET | Freq: Three times a day (TID) | ORAL | Status: DC | PRN
  Administered 2019-05-04 (×2): 5 mg via ORAL
  Filled 2019-05-04 (×2): qty 1

## 2019-05-04 MED ORDER — FLUOXETINE HCL 20 MG PO CAPS
40.0000 mg | ORAL_CAPSULE | Freq: Every day | ORAL | Status: DC
  Administered 2019-05-04 – 2019-05-05 (×2): 40 mg via ORAL
  Filled 2019-05-04 (×3): qty 2

## 2019-05-04 MED ORDER — HYDROCODONE-ACETAMINOPHEN 5-325 MG PO TABS
1.0000 | ORAL_TABLET | ORAL | Status: DC | PRN
  Administered 2019-05-04 (×2): 1 via ORAL
  Administered 2019-05-05: 12:00:00 2 via ORAL
  Filled 2019-05-04 (×2): qty 1
  Filled 2019-05-04 (×2): qty 2

## 2019-05-04 MED ORDER — DICYCLOMINE HCL 20 MG PO TABS
20.0000 mg | ORAL_TABLET | Freq: Three times a day (TID) | ORAL | Status: DC | PRN
  Filled 2019-05-04: qty 1

## 2019-05-04 MED ORDER — POTASSIUM CHLORIDE CRYS ER 20 MEQ PO TBCR
40.0000 meq | EXTENDED_RELEASE_TABLET | Freq: Once | ORAL | Status: AC
  Administered 2019-05-04: 40 meq via ORAL
  Filled 2019-05-04: qty 2

## 2019-05-04 MED ORDER — AMLODIPINE BESYLATE 5 MG PO TABS
5.0000 mg | ORAL_TABLET | Freq: Every day | ORAL | Status: DC
  Administered 2019-05-04 – 2019-05-05 (×2): 5 mg via ORAL
  Filled 2019-05-04 (×2): qty 1

## 2019-05-04 MED ORDER — DIPHENOXYLATE-ATROPINE 2.5-0.025 MG PO TABS: 1.0000 | ORAL_TABLET | Freq: Four times a day (QID) | ORAL | Status: DC | PRN

## 2019-05-04 MED ORDER — ENOXAPARIN SODIUM 40 MG/0.4ML ~~LOC~~ SOLN
40.0000 mg | SUBCUTANEOUS | Status: DC
  Administered 2019-05-04 (×2): 40 mg via SUBCUTANEOUS
  Filled 2019-05-04 (×2): qty 0.4

## 2019-05-04 MED ORDER — ALPRAZOLAM 0.5 MG PO TABS
0.2500 mg | ORAL_TABLET | Freq: Every day | ORAL | Status: DC | PRN
  Administered 2019-05-04: 0.5 mg via ORAL
  Filled 2019-05-04: qty 1

## 2019-05-04 MED ORDER — ESTRADIOL 1 MG PO TABS
2.0000 mg | ORAL_TABLET | Freq: Every day | ORAL | Status: DC
  Administered 2019-05-04 – 2019-05-05 (×2): 2 mg via ORAL
  Filled 2019-05-04 (×2): qty 2

## 2019-05-04 MED ORDER — METOPROLOL SUCCINATE ER 25 MG PO TB24
25.0000 mg | ORAL_TABLET | Freq: Every day | ORAL | Status: DC
  Administered 2019-05-04 – 2019-05-05 (×2): 25 mg via ORAL
  Filled 2019-05-04 (×2): qty 1

## 2019-05-04 MED ORDER — ONDANSETRON 4 MG PO TBDP: 4.0000 mg | ORAL_TABLET | Freq: Three times a day (TID) | ORAL | Status: DC | PRN

## 2019-05-04 MED ORDER — HYDROCODONE-ACETAMINOPHEN 5-325 MG PO TABS
1.0000 | ORAL_TABLET | ORAL | Status: DC | PRN
  Administered 2019-05-04 (×3): 1 via ORAL
  Filled 2019-05-04 (×3): qty 1

## 2019-05-04 MED ORDER — LIDOCAINE-PRILOCAINE 2.5-2.5 % EX CREA: 1.0000 "application " | TOPICAL_CREAM | CUTANEOUS | Status: DC | PRN

## 2019-05-04 MED ORDER — MAGNESIUM OXIDE 400 (241.3 MG) MG PO TABS
400.0000 mg | ORAL_TABLET | Freq: Two times a day (BID) | ORAL | Status: DC
  Administered 2019-05-04 – 2019-05-05 (×4): 400 mg via ORAL
  Filled 2019-05-04 (×4): qty 1

## 2019-05-04 MED ORDER — DICYCLOMINE HCL 10 MG PO CAPS: 20.0000 mg | ORAL_CAPSULE | Freq: Three times a day (TID) | ORAL | Status: DC | PRN

## 2019-05-04 MED ORDER — ALBUTEROL SULFATE HFA 108 (90 BASE) MCG/ACT IN AERS
2.0000 | INHALATION_SPRAY | RESPIRATORY_TRACT | Status: DC | PRN
  Filled 2019-05-04: qty 6.7

## 2019-05-04 MED ORDER — TEMAZEPAM 15 MG PO CAPS
30.0000 mg | ORAL_CAPSULE | Freq: Every day | ORAL | Status: DC
  Administered 2019-05-04: 30 mg via ORAL
  Filled 2019-05-04: qty 2

## 2019-05-04 MED ORDER — ESTAZOLAM 2 MG PO TABS: 2.0000 mg | ORAL_TABLET | Freq: Every day | ORAL | Status: DC

## 2019-05-04 MED ORDER — LEVOTHYROXINE SODIUM 50 MCG PO TABS
50.0000 ug | ORAL_TABLET | Freq: Every day | ORAL | Status: DC
  Administered 2019-05-04 – 2019-05-05 (×2): 50 ug via ORAL
  Filled 2019-05-04 (×2): qty 1

## 2019-05-04 NOTE — ED Notes (Signed)
ED TO INPATIENT HANDOFF REPORT  ED Nurse Name and Phone #:  C Turner  S Name/Age/Gender Rebekah Henderson 63 y.o. female Room/Bed: APA11/APA11  Code Status   Code Status: Full Code  Home/SNF/Other Home Patient oriented to: self, place, time and situation Is this baseline? Yes   Triage Complete: Triage complete  Chief Complaint Urinary Problems  Triage Note Patient c/o dysuria that started Friday night. Per patient took 12 azo pills over the weekend with some improvement but still has discomfort with voiding. Patient has lung cancer and receives chemo and next dose is tomorrow at 8am. Per patient has had some nausea and vomiting after chemo last week.    Allergies Allergies  Allergen Reactions  . Penicillins Hives, Swelling and Other (See Comments)    Reaction:  Face/mouth swelling  Has patient had a PCN reaction causing immediate rash, facial/tongue/throat swelling, SOB or lightheadedness with hypotension: Yes Has patient had a PCN reaction causing severe rash involving mucus membranes or skin necrosis: No Has patient had a PCN reaction that required hospitalization No Has patient had a PCN reaction occurring within the last 10 years: No If all of the above answers are "NO", then may proceed with Cephalosporin use.  . Codeine Nausea Only  . Fentanyl Nausea And Vomiting    restless  . Ranitidine Hcl Other (See Comments) and Nausea Only    Reaction:  Dizziness   . Keflex [Cephalexin] Other (See Comments)    Reaction:  Unknown   . Lyrica [Pregabalin] Other (See Comments)    Reaction:  Somnolence     Level of Care/Admitting Diagnosis ED Disposition    ED Disposition Condition [place]: [facility] [[id number]]  Level of Care: Med-Surg [16]  Covid Evaluation: Confirmed COVID Negative  Diagnosis: Pyelonephritis [[id number]]  Admitting Physician: Vashti Hey [[id number]]  Attending Physician: Vashti Hey [[id number]]  Estimated  length of stay: past midnight tomorrow  Certification:: I certify this patient will need inpatient services for at least 2 midnights  PT Class (Do Not Modify): Inpatient [101]  PT Acc Code (Do Not Modify): Private [1]       B Medical/Surgery History Past Medical History:  Diagnosis Date  . 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 Anxiety    takes Prozac daily  . Arthritis   . Benign fundic gland polyps of stomach   . Colon polyps   . COPD (chronic obstructive pulmonary disease) ([place])   . Dehydration 03/06/2017  . Diverticulitis   . Dyspnea    with exertion  . Encounter for antineoplastic chemotherapy 12/03/2016  . Fibromyalgia   . GERD (gastroesophageal reflux disease)    takes Pantoprazole daily  . Hypertension    takes Metoprolol,Triamterene-HCTZ,and Amlodipine daily  . Hypothyroidism    takes Synthroid daily  . IBS (irritable bowel syndrome)   . lung ca dx'd 10/02/2016   skin, lung  . PONV (postoperative nausea and vomiting)    pt also states that she had some difficulty breathing after cervical fusion   Past Surgical History:  Procedure Laterality Date  . BIOPSY N/A 05/25/2013   Procedure: BIOPSIES (Random Colon; Duodenal; Gastric);  Surgeon: Danie Binder, MD;  Location: [facility];  Service: Endoscopy;  Laterality: N/A;  . BLADDER SUSPENSION    . BREAST ENHANCEMENT SURGERY    . BREAST IMPLANT REMOVAL    . CERVICAL FUSION  AUG 2013  . CHOLECYSTECTOMY  1999  . COLONOSCOPY  2007 [place]  POLYPS  . COLONOSCOPY WITH PROPOFOL N/A 05/25/2013   Procedure: COLONOSCOPY WITH PROPOFOL(at cecum 0957) total withdrawal time=44min);  Surgeon: Danie Binder, MD;  Location: [facility];  Service: Endoscopy;  Laterality: N/A;  . ESOPHAGOGASTRODUODENOSCOPY (EGD) WITH PROPOFOL N/A 05/25/2013   Procedure: ESOPHAGOGASTRODUODENOSCOPY (EGD) WITH PROPOFOL;  Surgeon: Danie Binder, MD;  Location: [facility];  Service: Endoscopy;  Laterality: N/A;  . FOOT SURGERY    . IR FLUORO GUIDE  PORT INSERTION RIGHT  03/04/2018  . IR US GUIDE VASC ACCESS RIGHT  03/04/2018  . POLYPECTOMY N/A 05/25/2013   Procedure: POLYPECTOMY (Rectal and Gastric);  Surgeon: Danie Binder, MD;  Location: [facility];  Service: Endoscopy;  Laterality: N/A;  . TONSILLECTOMY    . UPPER GASTROINTESTINAL ENDOSCOPY    . VIDEO BRONCHOSCOPY WITH ENDOBRONCHIAL ULTRASOUND  09/12/2016   Procedure: VIDEO BRONCHOSCOPY WITH ENDOBRONCHIAL ULTRASOUND AND BIOPSY;  Surgeon: Juanito Doom, MD;  Location: [place];  Service: Cardiopulmonary;;     A IV Location/Drains/Wounds Patient Lines/Drains/Airways Status   Active Line/Drains/Airways    Name:   Placement date:   Placement time:   Site:   Days:   Implanted Port 03/04/18 Right Chest   03/04/18    -    Chest   426          Intake/Output Last 24 hours  Intake/Output Summary (Last 24 hours) at 05/04/2019 0436 Last data filed at 05/03/2019 2057 Gross per 24 hour  Intake 100 ml  Output -  Net 100 ml    Labs/Imaging Results for orders placed or performed during the hospital encounter of 05/03/19 (from the past 48 hour(s))  Urinalysis, Routine w reflex microscopic     Status: Abnormal   Collection Time: 05/03/19  4:42 PM  Result Value Ref Range   Color, Urine AMBER (A) YELLOW    Comment: BIOCHEMICALS MAY BE AFFECTED BY COLOR   APPearance HAZY (A) CLEAR   Specific Gravity, Urine 1.009 1.005 - 1.030   pH 6.0 5.0 - 8.0   Glucose, UA NEGATIVE NEGATIVE mg/dL   Hgb urine dipstick NEGATIVE NEGATIVE   Bilirubin Urine NEGATIVE NEGATIVE   Ketones, ur NEGATIVE NEGATIVE mg/dL   Protein, ur NEGATIVE NEGATIVE mg/dL   Nitrite POSITIVE (A) NEGATIVE   Leukocytes,Ua TRACE (A) NEGATIVE   RBC / HPF 0-5 0 - 5 RBC/hpf   WBC, UA 11-20 0 - 5 WBC/hpf   Bacteria, UA RARE (A) NONE SEEN   Squamous Epithelial / LPF 11-20 0 - 5    Comment: Performed at [facility], [address], [place], [place] [address]  CBC with Differential     Status: Abnormal   Collection Time: 05/03/19  6:55 PM   Result Value Ref Range   WBC 3.5 (L) 4.0 - 10.5 K/uL   RBC 2.47 (L) 3.87 - 5.11 MIL/uL   Hemoglobin 9.0 (L) 12.0 - 15.0 g/dL   HCT 27.4 (L) 36.0 - 46.0 %   MCV 110.9 (H) 80.0 - 100.0 fL   MCH 36.4 (H) 26.0 - 34.0 pg   MCHC 32.8 30.0 - 36.0 g/dL   RDW 16.6 (H) 11.5 - 15.5 %   Platelets 118 (L) 150 - 400 K/uL    Comment: PLATELET COUNT CONFIRMED BY SMEAR SPECIMEN CHECKED FOR CLOTS Immature Platelet Fraction may be clinically indicated, consider ordering this additional test [id number]    nRBC 0.0 0.0 - 0.2 %   Neutrophils Relative % 50 %   Neutro Abs 1.8 1.7 - 7.7 K/uL  Lymphocytes Relative 34 %   Lymphs Abs 1.2 0.7 - 4.0 K/uL   Monocytes Relative 11 %   Monocytes Absolute 0.4 0.1 - 1.0 K/uL   Eosinophils Relative 4 %   Eosinophils Absolute 0.2 0.0 - 0.5 K/uL   Basophils Relative 0 %   Basophils Absolute 0.0 0.0 - 0.1 K/uL   Immature Granulocytes 1 %   Abs Immature Granulocytes 0.03 0.00 - 0.07 K/uL    Comment: Performed at [facility], [address] [place], [place] [address]  Basic metabolic panel     Status: Abnormal   Collection Time: 05/03/19  6:55 PM  Result Value Ref Range   Sodium 139 135 - 145 mmol/L   Potassium 3.3 (L) 3.5 - 5.1 mmol/L   Chloride 102 98 - 111 mmol/L   CO2 27 22 - 32 mmol/L   Glucose, Bld 98 70 - 99 mg/dL   BUN 12 8 - 23 mg/dL   Creatinine, Ser 1.09 (H) 0.44 - 1.00 mg/dL   Calcium 8.9 8.9 - 10.3 mg/dL   GFR calc non Af Amer 53 (L) >60 mL/min   GFR calc Af Amer >60 >60 mL/min   Anion gap 10 5 - 15    Comment: Performed at [facility], [address], [place], [place] [address]  SARS Coronavirus 2 (CEPHEID - Performed in [facility] hospital lab), Hosp Order     Status: None   Collection Time: 05/03/19  9:35 PM  Result Value Ref Range   SARS Coronavirus 2 NEGATIVE NEGATIVE    Comment: (NOTE) If result is NEGATIVE SARS-CoV-2 target nucleic acids are NOT DETECTED. The SARS-CoV-2 RNA is generally detectable in upper and lower  respiratory  specimens during the acute phase of infection. The lowest  concentration of SARS-CoV-2 viral copies this assay can detect is 250  copies / mL. A negative result does not preclude SARS-CoV-2 infection  and should not be used as the sole basis for treatment or other  patient management decisions.  A negative result may occur with  improper specimen collection / handling, submission of specimen other  than nasopharyngeal swab, presence of viral mutation(s) within the  areas targeted by this assay, and inadequate number of viral copies  (<250 copies / mL). A negative result must be combined with clinical  observations, patient history, and epidemiological information. If result is POSITIVE SARS-CoV-2 target nucleic acids are DETECTED. The SARS-CoV-2 RNA is generally detectable in upper and lower  respiratory specimens dur ing the acute phase of infection.  Positive  results are indicative of active infection with SARS-CoV-2.  Clinical  correlation with patient history and other diagnostic information is  necessary to determine patient infection status.  Positive results do  not rule out bacterial infection or co-infection with other viruses. If result is PRESUMPTIVE POSTIVE SARS-CoV-2 nucleic acids MAY BE PRESENT.   A presumptive positive result was obtained on the submitted specimen  and confirmed on repeat testing.  While 2019 novel coronavirus  (SARS-CoV-2) nucleic acids may be present in the submitted sample  additional confirmatory testing may be necessary for epidemiological  and / or clinical management purposes  to differentiate between  SARS-CoV-2 and other Sarbecovirus currently known to infect humans.  If clinically indicated additional testing with an alternate test  methodology [phone]) is advised. The SARS-CoV-2 RNA is generally  detectable in upper and lower respiratory sp ecimens during the acute  phase of infection. The expected result is Negative. Fact Sheet for  Patients:  StrictlyIdeas.no  Fact Sheet for Healthcare Providers: BankingDealers.co.za This test is not yet approved or cleared by the [place] FDA and has been authorized for detection and/or diagnosis of SARS-CoV-2 by FDA under an Emergency Use Authorization (EUA).  This EUA will remain in effect (meaning this test can be used) for the duration of the COVID-19 declaration under Section 564(b)(1) of the Act, 21 U.S.C. section 360bbb-3(b)(1), unless the authorization is terminated or revoked sooner. Performed at [facility], [address] [place], [place] [address]   Culture, blood (routine x 2)     Status: None (Preliminary result)   Collection Time: 05/03/19 11:05 PM  Result Value Ref Range   Specimen Description BLOOD RIGHT ARM    Special Requests      BOTTLES DRAWN AEROBIC AND ANAEROBIC Blood Culture adequate volume Performed at [facility], [address], [place], [place] [address]    Culture PENDING    Report Status PENDING   Culture, blood (routine x 2)     Status: None (Preliminary result)   Collection Time: 05/03/19 11:07 PM  Result Value Ref Range   Specimen Description BLOOD RIGHT ARM    Special Requests      BOTTLES DRAWN AEROBIC AND ANAEROBIC Blood Culture adequate volume Performed at [facility], [address], [place], [place] [address]    Culture PENDING    Report Status PENDING    No results found.  Pending Labs Unresulted Labs (From admission, onward)    Start     Ordered   05/10/19 0500  Creatinine, serum  (enoxaparin (LOVENOX)    CrCl >/= 30 ml/min)  Weekly,   R    Comments:  while on enoxaparin therapy    05/04/19 0002   05/04/19 7035  Basic metabolic panel  Tomorrow morning,   R     05/04/19 0002   05/04/19 0500  CBC  Tomorrow morning,   R     05/04/19 0002   05/03/19 1819  Urine culture  ONCE - STAT,   STAT     05/03/19 1818          Vitals/Pain Today's Vitals   05/04/19 0055 05/04/19  0058 05/04/19 0208 05/04/19 0210  BP:  124/85    Pulse:  86    Resp:  18    Temp:      TempSrc:      SpO2:  97%    Weight:      Height:      PainSc: 9   Asleep Asleep    Isolation Precautions No active isolations  Medications Medications  HYDROcodone-acetaminophen (NORCO/VICODIN) 5-325 MG per tablet 1 tablet (1 tablet Oral Given 05/04/19 0055)  metoprolol succinate (TOPROL-XL) 24 hr tablet 25 mg (has no administration in time range)  ALPRAZolam (XANAX) tablet 0.25-0.5 mg (0.5 mg Oral Given 05/04/19 0057)  estazolam (PROSOM) tablet 2 mg (2 mg Oral Not Given 05/04/19 0041)  FLUoxetine (PROZAC) capsule 40 mg (has no administration in time range)  estradiol (ESTRACE) tablet 2 mg (has no administration in time range)  levothyroxine (SYNTHROID) tablet 50 mcg (has no administration in time range)  dicyclomine (BENTYL) tablet 20 mg (has no administration in time range)  diphenoxylate-atropine (LOMOTIL) 2.5-0.025 MG per tablet 1 tablet (has no administration in time range)  ondansetron (ZOFRAN-ODT) disintegrating tablet 4 mg (has no administration in time range)  pantoprazole (PROTONIX) EC tablet 40 mg (has no administration in time range)  senna-docusate (Senokot-S) tablet 1 tablet (has no administration in time range)  cyclobenzaprine (  FLEXERIL) tablet 5 mg (5 mg Oral Given 05/04/19 0057)  magnesium oxide (MAG-OX) tablet 400 mg (400 mg Oral Given 05/04/19 0106)  albuterol (VENTOLIN HFA) 108 (90 Base) MCG/ACT inhaler 2 puff (has no administration in time range)  promethazine (PHENERGAN) tablet 25 mg (has no administration in time range)  lidocaine-prilocaine (EMLA) cream 1 application (has no administration in time range)  enoxaparin (LOVENOX) injection 40 mg (40 mg Subcutaneous Given 05/04/19 0054)  meropenem (MERREM) 1 g in sodium chloride 0.9 % 100 mL IVPB (0 g Intravenous Stopped 05/03/19 2057)  potassium chloride SA (K-DUR) CR tablet 40 mEq (40 mEq Oral Given 05/04/19 0106)     Mobility walks High fall risk   Focused Assessments    R Recommendations: See Admitting Provider Note  Report given to:   Additional Notes:

## 2019-05-04 NOTE — Progress Notes (Signed)
PROGRESS NOTE    Rebekah Henderson  FGH:[id number] DOB: [birth year] DOA: 05/03/2019 PCP: Redmond School, MD   Brief Narrative:  Per HPI: Rebekah Henderson is an 63 y.o. female with past medical history significant for small cell lung CA who is been getting chemotherapy for the past 3 years was in her usual state of health until 3 days prior to admission when she had an episode of nausea vomiting and diarrhea which she attributed to being secondary to her chemotherapy.  2 days prior to admission patient developed significant dysuria urgency and frequency.  She self treated with AZO with some good effect however this morning her dysuria was significantly worse and she felt tired and had some more nausea but no further vomiting or diarrhea.  Patient's past medical history significant for ESBL UTI.  Patient states this feels just like her previous episodes of UTI.  Patient denies any fevers or chills.  Does not know if she has malaise that she generally feels very worn down for secondary to her chemotherapy.  Patient admits to left lower quadrant pain as well as some left-sided back pain.  Patient denies hematemesis or melena or hematochezia.  Patient is not sure if she has any hematuria.  Patient was admitted with UTI/pyelonephritis and empirically started on meropenem given prior history of ESBL.  Urine cultures obtained and pending.   Assessment & Plan:   Principal Problem:   Acute pyelonephritis Active Problems:   COPD (chronic obstructive pulmonary disease) (HCC)   Hypothyroidism   Anxiety   Small cell lung cancer, left (H[facility])   Anemia   Pyelonephritis  63 year old female in immunocompromise state secondary to ongoing chemotherapy for small cell lung CA presents with acute pyelonephritis.  She has history of ESBL and has been started on meropenem.  ACUTE PYELONEPHRITIS WITH IMMUNOCOMPROMISED STATE  Patient with prior history of ESBL UTI now presents with signs and symptoms of pyelonephritis  Patient is an immunocompromise state secondary to ongoing chemotherapy, she is due for dose of chemotherapy tomorrow with Dr. Julien Nordmann at [facility] which will need to be deferred for the time being. Will treat with meropenem Urine culture and blood cultures are pending.  HYPOKALEMIA-resolved Recheck BMP as well as magnesium in a.m.  HTN Restart home amlodipine Continue metoprolol per home doses  ANXIETY Continue Prozac, Xanax per home doses  HYPOTHYROIDISM Continue Synthroid  SCLCA Ongoing treatment per Dr. Julien Nordmann at [facility]  Chronic hypoxemia Wears 2 L nasal cannula at home    DVT prophylaxis: Lovenox Code Status: Full Family Communication: None at bedside Disposition Plan: Continue on empiric meropenem and await further urine cultures   Consultants:   None  Procedures:   None  Antimicrobials:   Merrem 6/1->   Subjective: Patient seen and evaluated today with no new acute complaints or concerns. No acute concerns or events noted overnight.  She states that she is feeling less nauseous and willing to try diet this morning.  Denies any significant abdominal or suprapubic pain.  Objective: Vitals:   05/03/19 2130 05/03/19 2206 05/03/19 2230 05/04/19 0058  BP: 112/66 129/69 127/71 124/85  Pulse: 80 79 79 86  Resp: 18 18 18 18   Temp:      TempSrc:      SpO2: 90% 95% 96% 97%  Weight:      Height:        Intake/Output Summary (Last 24 hours) at 05/04/2019 0707 Last data filed at 05/03/2019 2057 Gross per 24 hour  Intake 100  ml  Output -  Net 100 ml   Filed Weights   05/03/19 1631  Weight: 68 kg    Examination:  General exam: Appears calm and comfortable  Respiratory system: Clear to auscultation. Respiratory effort normal.  Currently on nasal cannula 1 L. Cardiovascular system: S1 & S2 heard, RRR. No JVD, murmurs, rubs, gallops or clicks. No pedal edema. Gastrointestinal system: Abdomen is nondistended, soft and nontender. No organomegaly  or masses felt. Normal bowel sounds heard. Central nervous system: Alert and oriented. No focal neurological deficits. Extremities: Symmetric 5 x 5 power. Skin: No rashes, lesions or ulcers Psychiatry: Judgement and insight appear normal. Mood & affect appropriate.     Data Reviewed: I have personally reviewed following labs and imaging studies  CBC: Recent Labs  Lab 04/27/19 0812 05/03/19 1855 05/04/19 0517  WBC 3.0* 3.5* 3.7*  NEUTROABS 1.0* 1.8  --   HGB 8.0* 9.0* 9.3*  HCT 23.9* 27.4* 28.5*  MCV 109.6* 110.9* 109.6*  PLT 76* 118* 678*   Basic Metabolic Panel: Recent Labs  Lab 04/27/19 0812 05/03/19 1855 05/04/19 0517  NA 141 139 142  K 3.9 3.3* 4.4  CL 106 102 105  CO2 26 27 25   GLUCOSE 90 98 87  BUN 11 12 10   CREATININE 0.90 1.09* 1.04*  CALCIUM 8.0* 8.9 8.9  MG 1.2*  --   --    GFR: Estimated Creatinine Clearance: 51.1 mL/min (A) (by C-G formula based on SCr of 1.04 mg/dL (H)). Liver Function Tests: Recent Labs  Lab 04/27/19 0812  AST 10*  ALT 6  ALKPHOS 53  BILITOT 0.4  PROT 6.0*  ALBUMIN 3.4*   No results for input(s): LIPASE, AMYLASE in the last 168 hours. No results for input(s): AMMONIA in the last 168 hours. Coagulation Profile: No results for input(s): INR, PROTIME in the last 168 hours. Cardiac Enzymes: No results for input(s): CKTOTAL, CKMB, CKMBINDEX, TROPONINI in the last 168 hours. BNP (last 3 results) No results for input(s): PROBNP in the last 8760 hours. HbA1C: No results for input(s): HGBA1C in the last 72 hours. CBG: No results for input(s): GLUCAP in the last 168 hours. Lipid Profile: No results for input(s): CHOL, HDL, LDLCALC, TRIG, CHOLHDL, LDLDIRECT in the last 72 hours. Thyroid Function Tests: No results for input(s): TSH, T4TOTAL, FREET4, T3FREE, THYROIDAB in the last 72 hours. Anemia Panel: No results for input(s): VITAMINB12, FOLATE, FERRITIN, TIBC, IRON, RETICCTPCT in the last 72 hours. Sepsis Labs: No results for  input(s): PROCALCITON, LATICACIDVEN in the last 168 hours.  Recent Results (from the past 240 hour(s))  SARS Coronavirus 2 (CEPHEID - Performed in [facility] hospital lab), Hosp Order     Status: None   Collection Time: 05/03/19  9:35 PM  Result Value Ref Range Status   SARS Coronavirus 2 NEGATIVE NEGATIVE Final    Comment: (NOTE) If result is NEGATIVE SARS-CoV-2 target nucleic acids are NOT DETECTED. The SARS-CoV-2 RNA is generally detectable in upper and lower  respiratory specimens during the acute phase of infection. The lowest  concentration of SARS-CoV-2 viral copies this assay can detect is 250  copies / mL. A negative result does not preclude SARS-CoV-2 infection  and should not be used as the sole basis for treatment or other  patient management decisions.  A negative result may occur with  improper specimen collection / handling, submission of specimen other  than nasopharyngeal swab, presence of viral mutation(s) within the  areas targeted by this assay, and inadequate number  of viral copies  (<250 copies / mL). A negative result must be combined with clinical  observations, patient history, and epidemiological information. If result is POSITIVE SARS-CoV-2 target nucleic acids are DETECTED. The SARS-CoV-2 RNA is generally detectable in upper and lower  respiratory specimens dur ing the acute phase of infection.  Positive  results are indicative of active infection with SARS-CoV-2.  Clinical  correlation with patient history and other diagnostic information is  necessary to determine patient infection status.  Positive results do  not rule out bacterial infection or co-infection with other viruses. If result is PRESUMPTIVE POSTIVE SARS-CoV-2 nucleic acids MAY BE PRESENT.   A presumptive positive result was obtained on the submitted specimen  and confirmed on repeat testing.  While 2019 novel coronavirus  (SARS-CoV-2) nucleic acids may be present in the submitted sample   additional confirmatory testing may be necessary for epidemiological  and / or clinical management purposes  to differentiate between  SARS-CoV-2 and other Sarbecovirus currently known to infect humans.  If clinically indicated additional testing with an alternate test  methodology [phone]) is advised. The SARS-CoV-2 RNA is generally  detectable in upper and lower respiratory sp ecimens during the acute  phase of infection. The expected result is Negative. Fact Sheet for Patients:  StrictlyIdeas.no Fact Sheet for Healthcare Providers: BankingDealers.co.za This test is not yet approved or cleared by the [place] FDA and has been authorized for detection and/or diagnosis of SARS-CoV-2 by FDA under an Emergency Use Authorization (EUA).  This EUA will remain in effect (meaning this test can be used) for the duration of the COVID-19 declaration under Section 564(b)(1) of the Act, 21 U.S.C. section 360bbb-3(b)(1), unless the authorization is terminated or revoked sooner. Performed at [facility], [address], [place], [place] [address]   Culture, blood (routine x 2)     Status: None (Preliminary result)   Collection Time: 05/03/19 11:05 PM  Result Value Ref Range Status   Specimen Description BLOOD RIGHT ARM  Final   Special Requests   Final    BOTTLES DRAWN AEROBIC AND ANAEROBIC Blood Culture adequate volume Performed at [facility], [address] [place], [place] [address]    Culture PENDING  Incomplete   Report Status PENDING  Incomplete  Culture, blood (routine x 2)     Status: None (Preliminary result)   Collection Time: 05/03/19 11:07 PM  Result Value Ref Range Status   Specimen Description BLOOD RIGHT ARM  Final   Special Requests   Final    BOTTLES DRAWN AEROBIC AND ANAEROBIC Blood Culture adequate volume Performed at [facility], [address] [place], [place] [address]    Culture PENDING  Incomplete   Report  Status PENDING  Incomplete         Radiology Studies: No results found.      Scheduled Meds: . amLODipine  5 mg Oral Daily  . enoxaparin (LOVENOX) injection  40 mg Subcutaneous Q24H  . estazolam  2 mg Oral QHS  . estradiol  2 mg Oral Daily  . FLUoxetine  40 mg Oral Daily  . levothyroxine  50 mcg Oral QAC breakfast  . magnesium oxide  400 mg Oral BID  . metoprolol succinate  25 mg Oral Daily  . pantoprazole  40 mg Oral BID AC   Continuous Infusions:   LOS: 1 day    Time spent: 30 minutes    Neriah Brott Darleen Crocker, DO [facility] Pager [phone]  If 7PM-7AM, please contact night-coverage [email or web address]  Password [email or web address] 05/04/2019, 7:07 AM

## 2019-05-04 NOTE — Telephone Encounter (Signed)
Pt admitted to [facility] with pyelonephritis

## 2019-05-04 NOTE — Progress Notes (Signed)
Pharmacy Antibiotic Note  Rebekah Henderson is a 63 y.o. female admitted on 05/03/2019 with history of ESBL infection.  Pharmacy has been consulted for meropenem dosing.  Plan: Meropenem 1000 mg IV every 8 hours.  Monitor labs, c/s, and patient improvement.  Height: 5\' 4"  (162.6 cm) Weight: 150 lb (68 kg) IBW/kg (Calculated) : 54.7  Temp (24hrs), Avg:97.6 F (36.4 C), Min:97.5 F (36.4 C), Max:97.6 F (36.4 C)  Recent Labs  Lab 04/27/19 0812 05/03/19 1855 05/04/19 0517  WBC 3.0* 3.5* 3.7*  CREATININE 0.90 1.09* 1.04*    Estimated Creatinine Clearance: 51.1 mL/min (A) (by C-G formula based on SCr of 1.04 mg/dL (H)).    Allergies  Allergen Reactions  . Penicillins Hives, Swelling and Other (See Comments)    Reaction:  Face/mouth swelling  Has patient had a PCN reaction causing immediate rash, facial/tongue/throat swelling, SOB or lightheadedness with hypotension: Yes Has patient had a PCN reaction causing severe rash involving mucus membranes or skin necrosis: No Has patient had a PCN reaction that required hospitalization No Has patient had a PCN reaction occurring within the last 10 years: No If all of the above answers are "NO", then may proceed with Cephalosporin use.  . Codeine Nausea Only  . Fentanyl Nausea And Vomiting    restless  . Ranitidine Hcl Other (See Comments) and Nausea Only    Reaction:  Dizziness   . Keflex [Cephalexin] Other (See Comments)    Reaction:  Unknown   . Lyrica [Pregabalin] Other (See Comments)    Reaction:  Somnolence     Antimicrobials this admission: Meropenem 6/1 >>     Dose adjustments this admission: N/A  Microbiology results: 6/1 BCx: pending 6/1 UCx: pending    Thank you for allowing pharmacy to be a part of this patient's care.  Ramond Craver 05/04/2019 8:07 AM

## 2019-05-04 NOTE — ED Notes (Signed)
Pt says she is ready for bed, up to bathroom and back to room- pt given pillow and tucked in to bed.

## 2019-05-05 ENCOUNTER — Ambulatory Visit: Payer: Medicare Other

## 2019-05-05 ENCOUNTER — Other Ambulatory Visit: Payer: Medicare Other

## 2019-05-05 LAB — BASIC METABOLIC PANEL
Anion gap: 10 (ref 5–15)
BUN: 9 mg/dL (ref 8–23)
CO2: 25 mmol/L (ref 22–32)
Calcium: 8.8 mg/dL — ABNORMAL LOW (ref 8.9–10.3)
Chloride: 103 mmol/L (ref 98–111)
Creatinine, Ser: 0.96 mg/dL (ref 0.44–1.00)
GFR calc Af Amer: >60 mL/min (ref >60)
GFR calc non Af Amer: >60 mL/min (ref >60)
Glucose, Bld: 119 mg/dL — ABNORMAL HIGH (ref 70–99)
Potassium: 3.9 mmol/L (ref 3.5–5.1)
Sodium: 138 mmol/L (ref 135–145)

## 2019-05-05 LAB — URINE CULTURE: Culture: >=100000 — AB

## 2019-05-05 LAB — CBC
HCT: 26.5 % — ABNORMAL LOW (ref 36.0–46.0)
Hemoglobin: 8.8 g/dL — ABNORMAL LOW (ref 12.0–15.0)
MCH: 36.2 pg — ABNORMAL HIGH (ref 26.0–34.0)
MCHC: 33.2 g/dL (ref 30.0–36.0)
MCV: 109.1 fL — ABNORMAL HIGH (ref 80.0–100.0)
Platelets: 121 10*3/uL — ABNORMAL LOW (ref 150–400)
RBC: 2.43 MIL/uL — ABNORMAL LOW (ref 3.87–5.11)
RDW: 16.2 % — ABNORMAL HIGH (ref 11.5–15.5)
WBC: 3 10*3/uL — ABNORMAL LOW (ref 4.0–10.5)
nRBC: 0 % (ref 0.0–0.2)

## 2019-05-05 LAB — MAGNESIUM: Magnesium: 1.4 mg/dL — ABNORMAL LOW (ref 1.7–2.4)

## 2019-05-05 MED ORDER — HEPARIN SOD (PORK) LOCK FLUSH 100 UNIT/ML IV SOLN
500.0000 [IU] | INTRAVENOUS | Status: DC | PRN
  Administered 2019-05-05: 500 [IU]
  Filled 2019-05-05: qty 5

## 2019-05-05 MED ORDER — SODIUM CHLORIDE 0.9 % IV SOLN
1.0000 g | Freq: Once | INTRAVENOUS | Status: AC
  Administered 2019-05-05: 1000 mg via INTRAVENOUS
  Filled 2019-05-05: qty 1

## 2019-05-05 MED ORDER — ERTAPENEM IV (FOR PTA / DISCHARGE USE ONLY): 1.0000 g | INTRAVENOUS | 0 refills | Status: DC

## 2019-05-05 NOTE — TOC Transition Note (Addendum)
Transition of Care [facility]) - CM/SW Discharge Note   Patient Details  Name: Rebekah Henderson MRN: [id number] Date of Birth: [birth year]  [facility] CM/SW Contact:  Sherald Barge, RN Phone Number: 05/05/2019, 2:21 PM   Clinical Narrative:   DC home today with IV abx. Referral placed and first home dose will be given tomorrow. Pt lives alone but has support person that is able and willing to assist.    Expected Discharge Plan: [place] Barriers to Discharge: No Barriers Identified   Patient Goals and CMS Choice Patient states their goals for this hospitalization and ongoing recovery are:: get this infection over with CMS Medicare.gov Compare Post Acute Care list provided to:: Patient Choice offered to / list presented to : Patient  Expected Discharge Plan and Services Expected Discharge Plan: [place] Choice: [place] arrangements for the past 2 months: Single Family Home Expected Discharge Date: 05/05/19                         HH Arranged: RN, IV Antibiotics HH Agency: [facility] ([place]) Date [place]: 05/05/19 Time [place]: [place] Chapel Representative spoke with at [place]: [place] Arrangements/Services Living arrangements for the past 2 months: [place] Lives with:: Self Patient language and need for interpreter reviewed:: Yes Do you feel safe going back to the place where you live?: Yes      Need for Family Participation in Patient Care: Yes (Comment)(support person) Care giver support system in place?: Yes (comment)   Criminal Activity/Legal Involvement Pertinent to Current Situation/Hospitalization: No - Comment as needed  Activities of Daily Living Home Assistive Devices/Equipment: None ADL Screening (condition at time of admission) Patient's cognitive ability adequate to safely complete daily activities?: Yes Is  the patient deaf or have difficulty hearing?: No Does the patient have difficulty seeing, even when wearing glasses/contacts?: No Does the patient have difficulty concentrating, remembering, or making decisions?: No Patient able to express need for assistance with ADLs?: Yes Does the patient have difficulty dressing or bathing?: No Independently performs ADLs?: Yes (appropriate for developmental age) Does the patient have difficulty walking or climbing stairs?: Yes Weakness of Legs: None Weakness of Arms/Hands: None  Permission Sought/Granted Permission sought to share information with : Chartered certified accountant granted to share information with : Yes, Verbal Permission Granted     Permission granted to share info w AGENCY: [facility]        Emotional Assessment   Attitude/Demeanor/Rapport: Engaged Affect (typically observed): Appropriate, Calm, Pleasant Orientation: : Oriented to Self, Oriented to  Time, Oriented to Place, Oriented to Situation      Admission diagnosis:  UTI due to extended-spectrum beta lactamase (ESBL) producing Escherichia coli [N39.0, B96.29, Z16.12] Patient Active Problem List   Diagnosis Date Noted  . Acute pyelonephritis 05/03/2019  . Pyelonephritis 05/03/2019  . Nausea without vomiting 02/24/2019  . UTI due to extended-spectrum beta lactamase (ESBL) producing Escherichia coli 01/08/2019  . Viral gastroenteritis 01/08/2019  . Gastroenteritis 01/06/2019  . Goals of care, counseling/discussion 06/30/2018  . Encounter for antineoplastic immunotherapy 03/10/2018  . 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 Acute on chronic respiratory failure with hypoxia ([place]) 03/21/2017  . Mild protein-calorie malnutrition ([place]) 03/21/2017  . HCAP (healthcare-associated pneumonia) 03/20/2017  . Dehydration 03/06/2017  .  Anemia 01/04/2017  . Thrombocytopenia ([place]) 01/04/2017  . Influenza A 12/10/2016  . SIRS  (systemic inflammatory response syndrome) ([place]) 12/07/2016  . Hypoxia 12/07/2016  . Bronchitis 12/07/2016  . Encounter for antineoplastic chemotherapy 12/03/2016  . 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 Protein-calorie malnutrition, severe 10/30/2016  . Vomiting 10/29/2016  . Abdominal pain 10/29/2016  . Rash and nonspecific skin eruption 10/29/2016  . Neutropenic fever ([place]) 10/15/2016  . Intractable nausea and vomiting 10/15/2016  . Pancytopenia due to antineoplastic chemotherapy ([place]) 10/15/2016  . Nodular rash 10/15/2016  . Hypokalemia 10/15/2016  . Hypomagnesemia 10/15/2016  . Chronic respiratory failure with hypoxia ([place]) 10/15/2016  . Small cell lung cancer, left ([place]) 09/19/2016  . Mediastinal mass 09/10/2016  . COPD (chronic obstructive pulmonary disease) ([place])   . Hypertension   . Fibromyalgia   . IBS (irritable bowel syndrome)   . GERD (gastroesophageal reflux disease)   . Hypothyroidism   . Colon polyps   . Anxiety   . PONV (postoperative nausea and vomiting)   . Diarrhea 05/12/2013   PCP:  Redmond School, MD Pharmacy:   [facility] [place] [place], [place] [address] Phone: [phone] Fax: [place], [place] [address] Phone: [phone] Fax: [phone]     Social Determinants of Health (SDOH) Interventions    Readmission Risk Interventions Readmission Risk Prevention Plan 05/05/2019  Transportation Screening Complete  PCP or Specialist Appt within 3-5 Days Complete  HRI or [place] Complete  Social Work Consult for [place] Planning/Counseling Complete  Palliative Care Screening Not Applicable  Medication Review Press photographer) Complete  Some recent data might be hidden

## 2019-05-05 NOTE — Progress Notes (Signed)
Per Dr Heath Lark its okay to use Right PAC.PAC is functioning well per primary RN. RN is aware that PICC order will be d/c'd.

## 2019-05-05 NOTE — Progress Notes (Signed)
PHARMACY CONSULT NOTE FOR:  OUTPATIENT  PARENTERAL ANTIBIOTIC THERAPY (OPAT)  Indication: ESBL UTI/pyelonephritis Regimen: Ertapenem 1000 mg IV every 24 hours. End date: 05/10/2019 last day of administration.  IV antibiotic discharge orders are pended. To discharging provider:  please sign these orders via discharge navigator,  Select New Orders & click on the button choice - Manage This Unsigned Work.     Thank you for allowing pharmacy to be a part of this patient's care.  Ramond Craver 05/05/2019, 11:31 AM

## 2019-05-05 NOTE — Progress Notes (Signed)
Per MD okay for patient to be discharged with port accessed.

## 2019-05-05 NOTE — Discharge Summary (Signed)
Physician Discharge Summary  Rebekah Henderson ZOX:[id number] DOB: [birth year] DOA: 05/03/2019  PCP: Redmond School, MD  Admit date: 05/03/2019  Discharge date: 05/05/2019  Admitted From:Home  Disposition:  Home  Recommendations for Outpatient Follow-up:  1. Follow up with PCP in 1-2 weeks 2. Continue home infusions of ertapenem as scheduled with end date 05/10/2019 3. Follow-up with Dr. Earlie Server at [facility] for ongoing chemotherapy for her small cell lung cancer  Home Health: Yes with RN to assist with infusions  Equipment/Devices: None  Discharge Condition: Stable  CODE STATUS: Full  Diet recommendation: Heart Healthy  Brief/Interim Summary: Per HPI: Malala W Kornis an 63 y.o.femalewith past medical history significant for small cell lung CA who is been getting chemotherapy for the past 3 years was in her usual state of health until 3 days prior to admission when she had an episode of nausea vomiting and diarrhea which she attributed to being secondary to her chemotherapy. 2 days prior to admission patient developed significant dysuria urgency and frequency. She self treated with AZO with some good effect however this morning her dysuria was significantly worse and she felt tired and had some more nausea but no further vomiting or diarrhea.  Patient's past medical history significant for ESBL UTI. Patient states this feels just like her previous episodes of UTI.  Patient denies any fevers or chills. Does not know if she has malaise that she generally feels very worn down for secondary to her chemotherapy. Patient admits to left lower quadrant pain as well as some left-sided back pain. Patient denies hematemesis or melena or hematochezia. Patient is not sure if she has any hematuria.  Patient was admitted with UTI/pyelonephritis and empirically started on meropenem given prior history of ESBL.  Urine cultures obtained and has confirmed the presence of ESBL that was sensitive to  Merrem.  She will be converted to ertapenem and given 1 dose on 6/3 and discharged for ongoing dosages until 6/8.  She will receive home infusions and has an IV port available for infusions.  Home health has been arranged for her to complete her course of treatment.  No other acute events noted during this brief course of admission.  She will pursue ongoing treatment for her lung cancer with Dr. Julien Nordmann at [facility] as previously scheduled.  Discharge Diagnoses:  Principal Problem:   Acute pyelonephritis Active Problems:   COPD (chronic obstructive pulmonary disease) (HCC)   Hypothyroidism   Anxiety   Small cell lung cancer, left (HCC)   Anemia   Pyelonephritis  Principal discharge diagnosis: UTI/pyelonephritis with ESBL.  Discharge Instructions  Discharge Instructions    Diet - low sodium heart healthy   Complete by:  As directed    Home infusion instructions [facility] May follow [place] Dosing Protocol; May administer Cathflo as needed to maintain patency of vascular access device.; Flushing of vascular access device: per [facility] Protocol: 0.9% NaCl pre/post medica...   Complete by:  As directed    Instructions:  May follow [place] Dosing Protocol   Instructions:  May administer Cathflo as needed to maintain patency of vascular access device.   Instructions:  Flushing of vascular access device: per [facility] Protocol: 0.9% NaCl pre/post medication administration and prn patency; Heparin 100 u/ml, 95m for implanted ports and Heparin 10u/ml, 520mfor all other central venous catheters.   Instructions:  May follow [facility] Anaphylaxis Protocol for First Dose Administration in the home: 0.9% NaCl at 25-50 ml/hr to maintain IV access for  protocol meds. Epinephrine 0.3 ml IV/IM PRN and Benadryl 25-50 IV/IM PRN s/s of anaphylaxis.   Instructions:  [facility] Infusion Coordinator (RN) to assist per patient IV care needs in the home PRN.   Increase activity slowly   Complete by:  As  directed      Allergies as of 05/05/2019      Reactions   Penicillins Hives, Swelling, Other (See Comments)   Reaction:  Face/mouth swelling  Has patient had a PCN reaction causing immediate rash, facial/tongue/throat swelling, SOB or lightheadedness with hypotension: Yes Has patient had a PCN reaction causing severe rash involving mucus membranes or skin necrosis: No Has patient had a PCN reaction that required hospitalization No Has patient had a PCN reaction occurring within the last 10 years: No If all of the above answers are "NO", then may proceed with Cephalosporin use.   Codeine Nausea Only   Fentanyl Nausea And Vomiting   restless   Ranitidine Hcl Other (See Comments), Nausea Only   Reaction:  Dizziness    Keflex [cephalexin] Other (See Comments)   Reaction:  Unknown    Lyrica [pregabalin] Other (See Comments)   Reaction:  Somnolence       Medication List    STOP taking these medications   ciprofloxacin 500 MG tablet Commonly known as:  Cipro     TAKE these medications   albuterol 108 (90 Base) MCG/ACT inhaler Commonly known as:  VENTOLIN HFA Inhale 2 puffs into the lungs every 4 (four) hours as needed for wheezing or shortness of breath.   ALPRAZolam 0.25 MG tablet Commonly known as:  XANAX Take 2 tablets (0.5 mg total) by mouth at bedtime as needed for anxiety. What changed:    how much to take  when to take this  reasons to take this   amLODipine 5 MG tablet Commonly known as:  NORVASC Take 5 mg by mouth daily.   AZO-TABS 95 MG tablet Generic drug:  phenazopyridine Take 190 mg by mouth 2 (two) times a day.   cyclobenzaprine 5 MG tablet Commonly known as:  FLEXERIL TAKE ONE TABLET 3 TIMES A DAY AS NEEDED. What changed:    how much to take  how to take this  when to take this  reasons to take this  additional instructions   dicyclomine 20 MG tablet Commonly known as:  BENTYL Take 20 mg by mouth 3 (three) times daily as needed for  spasms.   diphenoxylate-atropine 2.5-0.025 MG tablet Commonly known as:  LOMOTIL TAKE 1 TABLET BY MOUTH 4 TIMES DAILY AS NEEDED FOR LOOSE STOOLS. What changed:    how much to take  how to take this  when to take this  reasons to take this  additional instructions   ertapenem  IVPB Commonly known as:  INVANZ Inject 1 g into the vein daily for 5 days. Indication:  ESBL UTI Last Day of Therapy:  05/10/2019 Labs - Once weekly:  CBC/D and BMP, Labs - Every other week:  ESR and CRP Start taking on:  May 06, 2019   estazolam 2 MG tablet Commonly known as:  PROSOM Take 2 mg by mouth at bedtime.   estradiol 2 MG tablet Commonly known as:  ESTRACE Take 2 mg by mouth daily.   FLUoxetine 40 MG capsule Commonly known as:  PROZAC Take 40 mg by mouth daily.   HYDROcodone-acetaminophen 5-325 MG tablet Commonly known as:  NORCO/VICODIN Take 1 tablet by mouth every 4 (four) hours as needed  for moderate pain.   levothyroxine 50 MCG tablet Commonly known as:  SYNTHROID Take 50 mcg by mouth daily before breakfast.   lidocaine-prilocaine cream Commonly known as:  EMLA Apply 1 application topically as needed. To numb skin over port a cath: Squeeze a  small amount on cotton ball and place over port site 1-2 hours prior to chemotherapy.   loperamide 2 MG capsule Commonly known as:  IMODIUM Take 2 mg by mouth every 2 (two) hours as needed for diarrhea or loose stools.   magnesium oxide 400 (241.3 Mg) MG tablet Commonly known as:  MAG-OX Take 1 tablet (400 mg total) by mouth 2 (two) times daily.   metoprolol succinate 25 MG 24 hr tablet Commonly known as:  TOPROL-XL Take 25 mg by mouth daily.   ondansetron 4 MG disintegrating tablet Commonly known as:  ZOFRAN-ODT Dissolve one tablet by mouth every 8 hours as needed for nausea and vomiting,   OXYGEN Inhale 2 L into the lungs daily as needed (for shortness of breath).   pantoprazole 40 MG tablet Commonly known as:   PROTONIX Take 40 mg by mouth 2 (two) times daily before a meal.   potassium chloride SA 20 MEQ tablet Commonly known as:  K-DUR Take 20 mEq by mouth daily as needed (for supplement).   promethazine 25 MG tablet Commonly known as:  PHENERGAN TAKE ONE TABLET BY MOUTH EVERY 6 HOURS AS NEEDED. What changed:  reasons to take this   senna-docusate 8.6-50 MG tablet Commonly known as:  Senokot-S Take 1 tablet by mouth daily as needed for mild constipation or moderate constipation.            Home Infusion Instuctions  (From admission, onward)         Start     Ordered   05/05/19 0000  Home infusion instructions [facility] May follow [place] Dosing Protocol; May administer Cathflo as needed to maintain patency of vascular access device.; Flushing of vascular access device: per [facility] Protocol: 0.9% NaCl pre/post medica...    Question Answer Comment  Instructions May follow [place] Dosing Protocol   Instructions May administer Cathflo as needed to maintain patency of vascular access device.   Instructions Flushing of vascular access device: per [facility] Protocol: 0.9% NaCl pre/post medication administration and prn patency; Heparin 100 u/ml, 57m for implanted ports and Heparin 10u/ml, 555mfor all other central venous catheters.   Instructions May follow [facility] Anaphylaxis Protocol for First Dose Administration in the home: 0.9% NaCl at 25-50 ml/hr to maintain IV access for protocol meds. Epinephrine 0.3 ml IV/IM PRN and Benadryl 25-50 IV/IM PRN s/s of anaphylaxis.   Instructions [facility] Infusion Coordinator (RN) to assist per patient IV care needs in the home PRN.      05/05/19 1423         Follow-up Information    [facility] Follow up on 05/12/2019.   Specialty:  Family Medicine Why:  Please follow up with Dr. FuGerarda Fractionn Wednesday, June 10th at 4:30pm. Contact information: [address] [place] [phone]         FuRedmond SchoolMD .   Specialty:  Internal Medicine Contact information: [phone]        Allergies  Allergen Reactions  . Penicillins Hives, Swelling and Other (See Comments)    Reaction:  Face/mouth swelling  Has patient had a PCN reaction causing immediate rash,  facial/tongue/throat swelling, SOB or lightheadedness with hypotension: Yes Has patient had a PCN reaction causing severe rash involving mucus membranes or skin necrosis: No Has patient had a PCN reaction that required hospitalization No Has patient had a PCN reaction occurring within the last 10 years: No If all of the above answers are "NO", then may proceed with Cephalosporin use.  . Codeine Nausea Only  . Fentanyl Nausea And Vomiting    restless  . Ranitidine Hcl Other (See Comments) and Nausea Only    Reaction:  Dizziness   . Keflex [Cephalexin] Other (See Comments)    Reaction:  Unknown   . Lyrica [Pregabalin] Other (See Comments)    Reaction:  Somnolence     Consultations:  None   Procedures/Studies: Ct Chest W Contrast  Result Date: 04/09/2019 CLINICAL DATA:  Restaging metastatic small cell lung cancer EXAM: CT CHEST, ABDOMEN, AND PELVIS WITH CONTRAST TECHNIQUE: Multidetector CT imaging of the chest, abdomen and pelvis was performed following the standard protocol during bolus administration of intravenous contrast. CONTRAST:  161m OMNIPAQUE IOHEXOL 300 MG/ML SOLN, additional oral enteric contrast COMPARISON:  CT chest, 01/08/2019, CT abdomen pelvis, 01/06/2019, CT chest abdomen pelvis, 10/27/2018 FINDINGS: CT CHEST FINDINGS Cardiovascular: No significant vascular findings. Normal heart size. No pericardial effusion. Mediastinum/Nodes: No significant change in bulky soft tissue about the left hilum, AP window, midesophagus, and carina. Lungs/Pleura: There has been significant interval resolution of previously seen right upper lobe opacity and consolidation.  Left upper lobe consolidation and volume loss is not significantly changed. There has been interval resolution of a previously seen small left pleural effusion. Mild underlying centrilobular emphysema. Musculoskeletal: No chest wall mass. CT ABDOMEN PELVIS FINDINGS Hepatobiliary: A previously described left lobe liver metastatic lesion is not well appreciated on current examination (series 2, image 56). No new lesions noted. Status post cholecystectomy with postoperative biliary ductal dilatation. Pancreas: Unremarkable. No pancreatic ductal dilatation or surrounding inflammatory changes. Spleen: Normal in size without focal abnormality. Adrenals/Urinary Tract: Adrenal glands are unremarkable. Kidneys are normal, without renal calculi, focal lesion, or hydronephrosis. Bladder is unremarkable. Stomach/Bowel: Stomach is within normal limits. No evidence of bowel wall thickening, distention, or inflammatory changes. Sigmoid diverticulosis. Vascular/Lymphatic: No significant vascular findings are present. No enlarged abdominal or pelvic lymph nodes. Reproductive: No mass or other abnormality. Other: No abdominal wall hernia or abnormality. No abdominopelvic ascites. Musculoskeletal: No change in a densely sclerotic metastatic lesion of the L3 vertebral body. No new metastatic lesions are noted. IMPRESSION: 1. There has been significant interval resolution of previously seen right upper lobe opacity and consolidation, most consistent with resolving radiation pneumonitis. 2. Left upper lobe post treatment consolidation and volume loss is not significantly changed. No significant change in bulky soft tissue about the left hilum, AP window, midesophagus, and carina. 3. There has been interval resolution of a previously seen small left pleural effusion. Mild underlying centrilobular emphysema. 4. A previously described left lobe liver metastatic lesion is not well appreciated on current examination (series 2, image 56). No  new evidence of metastatic disease in the abdomen or pelvis. 5. No change in a densely sclerotic metastatic lesion of the L3 vertebral body. No new osseous metastatic lesions are noted. Electronically Signed   By: AEddie CandleM.D.   On: 04/09/2019 12:32   Ct Abdomen Pelvis W Contrast  Result Date: 04/09/2019 CLINICAL DATA:  Restaging metastatic small cell lung cancer EXAM: CT CHEST, ABDOMEN, AND PELVIS WITH CONTRAST TECHNIQUE: Multidetector CT imaging of the chest, abdomen and  pelvis was performed following the standard protocol during bolus administration of intravenous contrast. CONTRAST:  149m OMNIPAQUE IOHEXOL 300 MG/ML SOLN, additional oral enteric contrast COMPARISON:  CT chest, 01/08/2019, CT abdomen pelvis, 01/06/2019, CT chest abdomen pelvis, 10/27/2018 FINDINGS: CT CHEST FINDINGS Cardiovascular: No significant vascular findings. Normal heart size. No pericardial effusion. Mediastinum/Nodes: No significant change in bulky soft tissue about the left hilum, AP window, midesophagus, and carina. Lungs/Pleura: There has been significant interval resolution of previously seen right upper lobe opacity and consolidation. Left upper lobe consolidation and volume loss is not significantly changed. There has been interval resolution of a previously seen small left pleural effusion. Mild underlying centrilobular emphysema. Musculoskeletal: No chest wall mass. CT ABDOMEN PELVIS FINDINGS Hepatobiliary: A previously described left lobe liver metastatic lesion is not well appreciated on current examination (series 2, image 56). No new lesions noted. Status post cholecystectomy with postoperative biliary ductal dilatation. Pancreas: Unremarkable. No pancreatic ductal dilatation or surrounding inflammatory changes. Spleen: Normal in size without focal abnormality. Adrenals/Urinary Tract: Adrenal glands are unremarkable. Kidneys are normal, without renal calculi, focal lesion, or hydronephrosis. Bladder is unremarkable.  Stomach/Bowel: Stomach is within normal limits. No evidence of bowel wall thickening, distention, or inflammatory changes. Sigmoid diverticulosis. Vascular/Lymphatic: No significant vascular findings are present. No enlarged abdominal or pelvic lymph nodes. Reproductive: No mass or other abnormality. Other: No abdominal wall hernia or abnormality. No abdominopelvic ascites. Musculoskeletal: No change in a densely sclerotic metastatic lesion of the L3 vertebral body. No new metastatic lesions are noted. IMPRESSION: 1. There has been significant interval resolution of previously seen right upper lobe opacity and consolidation, most consistent with resolving radiation pneumonitis. 2. Left upper lobe post treatment consolidation and volume loss is not significantly changed. No significant change in bulky soft tissue about the left hilum, AP window, midesophagus, and carina. 3. There has been interval resolution of a previously seen small left pleural effusion. Mild underlying centrilobular emphysema. 4. A previously described left lobe liver metastatic lesion is not well appreciated on current examination (series 2, image 56). No new evidence of metastatic disease in the abdomen or pelvis. 5. No change in a densely sclerotic metastatic lesion of the L3 vertebral body. No new osseous metastatic lesions are noted. Electronically Signed   By: AEddie CandleM.D.   On: 04/09/2019 12:32     Discharge Exam: Vitals:   05/05/19 0500 05/05/19 1330  BP: (!) 109/59 111/64  Pulse: 71 80  Resp: 16 17  Temp: 97.7 F (36.5 C) 97.9 F (36.6 C)  SpO2: 94% 98%   Vitals:   05/04/19 1948 05/04/19 2131 05/05/19 0500 05/05/19 1330  BP:  133/83 (!) 109/59 111/64  Pulse:  77 71 80  Resp:  _0 Temp:  (!) 97.5 F (36.4 C) 97.7 F (36.5 C) 97.9 F (36.6 C)  TempSrc:  Oral Oral   SpO2: 98% 94% 94% 98%  Weight:      Height:        General: Pt is alert, awake, not in acute distress Cardiovascular: RRR, S1/S2 +, no  rubs, no gallops Respiratory: CTA bilaterally, no wheezing, no rhonchi Abdominal: Soft, NT, ND, bowel sounds + Extremities: no edema, no cyanosis    The results of significant diagnostics from this hospitalization (including imaging, microbiology, ancillary and laboratory) are listed below for reference.     Microbiology: Recent Results (from the past 240 hour(s))  Urine culture     Status: Abnormal   Collection Time: 05/03/19  6:19  PM  Result Value Ref Range Status   Specimen Description   Final    URINE, RANDOM Performed at [facility], [address] [place], [place] [address]    Special Requests   Final    NONE Performed at [facility], [address], [place], [place] [address]    Culture (A)  Final    >=100,000 COLONIES/mL ESCHERICHIA COLI Confirmed Extended Spectrum Beta-Lactamase Producer (ESBL).  In bloodstream infections from ESBL organisms, carbapenems are preferred over piperacillin/tazobactam. They are shown to have a lower risk of mortality.    Report Status 05/05/2019 FINAL  Final   Organism ID, Bacteria ESCHERICHIA COLI (A)  Final      Susceptibility   Escherichia coli - MIC*    AMPICILLIN >=32 RESISTANT Resistant     CEFAZOLIN >=64 RESISTANT Resistant     CEFTRIAXONE >=64 RESISTANT Resistant     CIPROFLOXACIN >=4 RESISTANT Resistant     GENTAMICIN <=1 SENSITIVE Sensitive     IMIPENEM <=0.25 SENSITIVE Sensitive     NITROFURANTOIN 32 SENSITIVE Sensitive     TRIMETH/SULFA >=320 RESISTANT Resistant     AMPICILLIN/SULBACTAM >=32 RESISTANT Resistant     PIP/TAZO <=4 SENSITIVE Sensitive     Extended ESBL POSITIVE Resistant     * >=100,000 COLONIES/mL ESCHERICHIA COLI  SARS Coronavirus 2 (CEPHEID - Performed in [place] hospital lab), Hosp Order     Status: None   Collection Time: 05/03/19  9:35 PM  Result Value Ref Range Status   SARS Coronavirus 2 NEGATIVE NEGATIVE Final    Comment: (NOTE) If result is NEGATIVE SARS-CoV-2 target nucleic acids are NOT  DETECTED. The SARS-CoV-2 RNA is generally detectable in upper and lower  respiratory specimens during the acute phase of infection. The lowest  concentration of SARS-CoV-2 viral copies this assay can detect is 250  copies / mL. A negative result does not preclude SARS-CoV-2 infection  and should not be used as the sole basis for treatment or other  patient management decisions.  A negative result may occur with  improper specimen collection / handling, submission of specimen other  than nasopharyngeal swab, presence of viral mutation(s) within the  areas targeted by this assay, and inadequate number of viral copies  (<250 copies / mL). A negative result must be combined with clinical  observations, patient history, and epidemiological information. If result is POSITIVE SARS-CoV-2 target nucleic acids are DETECTED. The SARS-CoV-2 RNA is generally detectable in upper and lower  respiratory specimens dur ing the acute phase of infection.  Positive  results are indicative of active infection with SARS-CoV-2.  Clinical  correlation with patient history and other diagnostic information is  necessary to determine patient infection status.  Positive results do  not rule out bacterial infection or co-infection with other viruses. If result is PRESUMPTIVE POSTIVE SARS-CoV-2 nucleic acids MAY BE PRESENT.   A presumptive positive result was obtained on the submitted specimen  and confirmed on repeat testing.  While 2019 novel coronavirus  (SARS-CoV-2) nucleic acids may be present in the submitted sample  additional confirmatory testing may be necessary for epidemiological  and / or clinical management purposes  to differentiate between  SARS-CoV-2 and other Sarbecovirus currently known to infect humans.  If clinically indicated additional testing with an alternate test  methodology [phone]) is advised. The SARS-CoV-2 RNA is generally  detectable in upper and lower respiratory sp ecimens during  the acute  phase of infection. The expected result is Negative. Fact Sheet for Patients:  StrictlyIdeas.no Fact Sheet for Healthcare Providers: BankingDealers.co.za This test is not yet approved or cleared by the [place] FDA and has been authorized for detection and/or diagnosis of SARS-CoV-2 by FDA under an Emergency Use Authorization (EUA).  This EUA will remain in effect (meaning this test can be used) for the duration of the COVID-19 declaration under Section 564(b)(1) of the Act, 21 U.S.C. section 360bbb-3(b)(1), unless the authorization is terminated or revoked sooner. Performed at [facility], [address] [place], [place] [address]   Culture, blood (routine x 2)     Status: None (Preliminary result)   Collection Time: 05/03/19 11:05 PM  Result Value Ref Range Status   Specimen Description BLOOD RIGHT ARM  Final   Special Requests   Final    BOTTLES DRAWN AEROBIC AND ANAEROBIC Blood Culture adequate volume   Culture   Final    NO GROWTH 2 DAYS Performed at [facility], [address] [place], [place] [address]    Report Status PENDING  Incomplete  Culture, blood (routine x 2)     Status: None (Preliminary result)   Collection Time: 05/03/19 11:07 PM  Result Value Ref Range Status   Specimen Description BLOOD RIGHT ARM  Final   Special Requests   Final    BOTTLES DRAWN AEROBIC AND ANAEROBIC Blood Culture adequate volume   Culture   Final    NO GROWTH 2 DAYS Performed at [facility], [address], [place],  [address]    Report Status PENDING  Incomplete     Labs: BNP (last 3 results) No results for input(s): BNP in the last 8760 hours. Basic Metabolic Panel: Recent Labs  Lab 05/03/19 1855 05/04/19 0517 05/05/19 0508  NA 139 142 138  K 3.3* 4.4 3.9  CL 102 105 103  CO2 _0 GLUCOSE 98 87 119*  BUN _1 CREATININE 1.09* 1.04* 0.96  CALCIUM 8.9 8.9 8.8*  MG  --   --  1.4*   Liver  Function Tests: No results for input(s): AST, ALT, ALKPHOS, BILITOT, PROT, ALBUMIN in the last 168 hours. No results for input(s): LIPASE, AMYLASE in the last 168 hours. No results for input(s): AMMONIA in the last 168 hours. CBC: Recent Labs  Lab 05/03/19 1855 05/04/19 0517 05/05/19 0508  WBC 3.5* 3.7* 3.0*  NEUTROABS 1.8  --   --   HGB 9.0* 9.3* 8.8*  HCT 27.4* 28.5* 26.5*  MCV 110.9* 109.6* 109.1*  PLT 118* 116* 121*   Cardiac Enzymes: No results for input(s): CKTOTAL, CKMB, CKMBINDEX, TROPONINI in the last 168 hours. BNP: Invalid input(s): POCBNP CBG: No results for input(s): GLUCAP in the last 168 hours. D-Dimer No results for input(s): DDIMER in the last 72 hours. Hgb A1c No results for input(s): HGBA1C in the last 72 hours. Lipid Profile No results for input(s): CHOL, HDL, LDLCALC, TRIG, CHOLHDL, LDLDIRECT in the last 72 hours. Thyroid function studies No results for input(s): TSH, T4TOTAL, T3FREE, THYROIDAB in the last 72 hours.  Invalid input(s): FREET3 Anemia work up No results for input(s): VITAMINB12, FOLATE, FERRITIN, TIBC, IRON, RETICCTPCT in the last 72 hours. Urinalysis    Component Value Date/Time   COLORURINE AMBER (A) 05/03/2019 1642   APPEARANCEUR HAZY (A) 05/03/2019 1642   LABSPEC 1.009 05/03/2019 1642   PHURINE 6.0 05/03/2019 1642   GLUCOSEU NEGATIVE 05/03/2019 1642   HGBUR NEGATIVE 05/03/2019 1642   BILIRUBINUR NEGATIVE 05/03/2019 [place] 05/03/2019 1642   PROTEINUR  NEGATIVE 05/03/2019 1642   UROBILINOGEN 0.2 03/10/2014 1027   NITRITE POSITIVE (A) 05/03/2019 1642   LEUKOCYTESUR TRACE (A) 05/03/2019 1642   Sepsis Labs Invalid input(s): PROCALCITONIN,  WBC,  LACTICIDVEN Microbiology Recent Results (from the past 240 hour(s))  Urine culture     Status: Abnormal   Collection Time: 05/03/19  6:19 PM  Result Value Ref Range Status   Specimen Description   Final    URINE, RANDOM Performed at [facility], [address], [place], [place] [address]    Special Requests   Final    NONE Performed at [facility], [address] [place], [place] [address]    Culture (A)  Final    >=100,000 COLONIES/mL ESCHERICHIA COLI Confirmed Extended Spectrum Beta-Lactamase Producer (ESBL).  In bloodstream infections from ESBL organisms, carbapenems are preferred over piperacillin/tazobactam. They are shown to have a lower risk of mortality.    Report Status 05/05/2019 FINAL  Final   Organism ID, Bacteria ESCHERICHIA COLI (A)  Final      Susceptibility   Escherichia coli - MIC*    AMPICILLIN >=32 RESISTANT Resistant     CEFAZOLIN >=64 RESISTANT Resistant     CEFTRIAXONE >=64 RESISTANT Resistant     CIPROFLOXACIN >=4 RESISTANT Resistant     GENTAMICIN <=1 SENSITIVE Sensitive     IMIPENEM <=0.25 SENSITIVE Sensitive     NITROFURANTOIN 32 SENSITIVE Sensitive     TRIMETH/SULFA >=320 RESISTANT Resistant     AMPICILLIN/SULBACTAM >=32 RESISTANT Resistant     PIP/TAZO <=4 SENSITIVE Sensitive     Extended ESBL POSITIVE Resistant     * >=100,000 COLONIES/mL ESCHERICHIA COLI  SARS Coronavirus 2 (CEPHEID - Performed in [place] hospital lab), Hosp Order     Status: None   Collection Time: 05/03/19  9:35 PM  Result Value Ref Range Status   SARS Coronavirus 2 NEGATIVE NEGATIVE Final    Comment: (NOTE) If result is NEGATIVE SARS-CoV-2 target nucleic acids are NOT DETECTED. The SARS-CoV-2 RNA is generally detectable in upper and lower  respiratory specimens during the acute phase of infection. The lowest  concentration of SARS-CoV-2 viral copies this assay can detect is 250  copies / mL. A negative result does not preclude SARS-CoV-2 infection  and should not be used as the sole basis for treatment or other  patient management decisions.  A negative result may occur with  improper specimen collection / handling, submission of specimen other  than nasopharyngeal swab, presence of viral mutation(s) within the  areas targeted by  this assay, and inadequate number of viral copies  (<250 copies / mL). A negative result must be combined with clinical  observations, patient history, and epidemiological information. If result is POSITIVE SARS-CoV-2 target nucleic acids are DETECTED. The SARS-CoV-2 RNA is generally detectable in upper and lower  respiratory specimens dur ing the acute phase of infection.  Positive  results are indicative of active infection with SARS-CoV-2.  Clinical  correlation with patient history and other diagnostic information is  necessary to determine patient infection status.  Positive results do  not rule out bacterial infection or co-infection with other viruses. If result is PRESUMPTIVE POSTIVE SARS-CoV-2 nucleic acids MAY BE PRESENT.   A presumptive positive result was obtained on the submitted specimen  and confirmed on repeat testing.  While 2019 novel coronavirus  (SARS-CoV-2) nucleic acids may be present in the submitted sample  additional confirmatory testing may be necessary for epidemiological  and / or clinical management purposes  to  differentiate between  SARS-CoV-2 and other Sarbecovirus currently known to infect humans.  If clinically indicated additional testing with an alternate test  methodology [phone]) is advised. The SARS-CoV-2 RNA is generally  detectable in upper and lower respiratory sp ecimens during the acute  phase of infection. The expected result is Negative. Fact Sheet for Patients:  StrictlyIdeas.no Fact Sheet for Healthcare Providers: BankingDealers.co.za This test is not yet approved or cleared by the [place] FDA and has been authorized for detection and/or diagnosis of SARS-CoV-2 by FDA under an Emergency Use Authorization (EUA).  This EUA will remain in effect (meaning this test can be used) for the duration of the COVID-19 declaration under Section 564(b)(1) of the Act, 21 U.S.C. section  360bbb-3(b)(1), unless the authorization is terminated or revoked sooner. Performed at [facility], [address] [place], [place] [address]   Culture, blood (routine x 2)     Status: None (Preliminary result)   Collection Time: 05/03/19 11:05 PM  Result Value Ref Range Status   Specimen Description BLOOD RIGHT ARM  Final   Special Requests   Final    BOTTLES DRAWN AEROBIC AND ANAEROBIC Blood Culture adequate volume   Culture   Final    NO GROWTH 2 DAYS Performed at [facility], [address], [place], [place] [address]    Report Status PENDING  Incomplete  Culture, blood (routine x 2)     Status: None (Preliminary result)   Collection Time: 05/03/19 11:07 PM  Result Value Ref Range Status   Specimen Description BLOOD RIGHT ARM  Final   Special Requests   Final    BOTTLES DRAWN AEROBIC AND ANAEROBIC Blood Culture adequate volume   Culture   Final    NO GROWTH 2 DAYS Performed at [facility], [address], [place], [place] [address]    Report Status PENDING  Incomplete     Time coordinating discharge: 35 minutes  SIGNED:   Rodena Goldmann, DO [facility] 05/05/2019, 2:24 PM  If 7PM-7AM, please contact night-coverage [email or web address] Password [email or web address]

## 2019-05-05 NOTE — Progress Notes (Signed)
Patient's Port clean dry and intact. Patient's port accessed per MD at discharge. Discharge instructions including medications and follow up appointments were reviewed and discussed with patient. All questions were answered and no further questions at this time. Pt in stable condition and in no acute discharge at time of discharge. Pt will be escorted by nurse tech.

## 2019-05-05 NOTE — Care Management Important Message (Signed)
Important Message  Patient Details  Name: Rebekah Henderson MRN: [id number] Date of Birth: 11/24/1956   Medicare Important Message Given:  Yes    Tommy Medal 05/05/2019, 2:11 PM

## 2019-05-07 ENCOUNTER — Encounter (HOSPITAL_COMMUNITY): Payer: Self-pay

## 2019-05-07 ENCOUNTER — Telehealth: Payer: Self-pay | Admitting: *Deleted

## 2019-05-07 ENCOUNTER — Emergency Department (HOSPITAL_COMMUNITY): Payer: Medicare Other

## 2019-05-07 ENCOUNTER — Other Ambulatory Visit: Payer: Self-pay

## 2019-05-07 ENCOUNTER — Inpatient Hospital Stay (HOSPITAL_COMMUNITY)
Admission: EM | Admit: 2019-05-07 | Discharge: 2019-05-17 | DRG: 180 | Disposition: A | Discharge status: Skilled Nursing Facility | Payer: Medicare Other | Attending: Family Medicine | Admitting: Family Medicine

## 2019-05-07 DIAGNOSIS — D539 Nutritional anemia, unspecified: Secondary | ICD-10-CM | POA: Diagnosis present

## 2019-05-07 DIAGNOSIS — R4182 Altered mental status, unspecified: Secondary | ICD-10-CM | POA: Diagnosis not present

## 2019-05-07 DIAGNOSIS — R1310 Dysphagia, unspecified: Secondary | ICD-10-CM | POA: Diagnosis present

## 2019-05-07 DIAGNOSIS — G8929 Other chronic pain: Secondary | ICD-10-CM

## 2019-05-07 DIAGNOSIS — N136 Pyonephrosis: Secondary | ICD-10-CM | POA: Diagnosis not present

## 2019-05-07 DIAGNOSIS — R443 Hallucinations, unspecified: Secondary | ICD-10-CM | POA: Diagnosis not present

## 2019-05-07 DIAGNOSIS — N12 Tubulo-interstitial nephritis, not specified as acute or chronic: Secondary | ICD-10-CM | POA: Diagnosis present

## 2019-05-07 DIAGNOSIS — C3402 Malignant neoplasm of left main bronchus: Principal | ICD-10-CM | POA: Diagnosis present

## 2019-05-07 DIAGNOSIS — M797 Fibromyalgia: Secondary | ICD-10-CM | POA: Diagnosis present

## 2019-05-07 DIAGNOSIS — F329 Major depressive disorder, single episode, unspecified: Secondary | ICD-10-CM | POA: Diagnosis present

## 2019-05-07 DIAGNOSIS — Z1159 Encounter for screening for other viral diseases: Secondary | ICD-10-CM

## 2019-05-07 DIAGNOSIS — Z9221 Personal history of antineoplastic chemotherapy: Secondary | ICD-10-CM | POA: Diagnosis not present

## 2019-05-07 DIAGNOSIS — J9809 Other diseases of bronchus, not elsewhere classified: Secondary | ICD-10-CM | POA: Diagnosis present

## 2019-05-07 DIAGNOSIS — E876 Hypokalemia: Secondary | ICD-10-CM | POA: Diagnosis present

## 2019-05-07 DIAGNOSIS — Z885 Allergy status to narcotic agent status: Secondary | ICD-10-CM

## 2019-05-07 DIAGNOSIS — B9620 Unspecified Escherichia coli [E. coli] as the cause of diseases classified elsewhere: Secondary | ICD-10-CM | POA: Diagnosis not present

## 2019-05-07 DIAGNOSIS — Z9049 Acquired absence of other specified parts of digestive tract: Secondary | ICD-10-CM

## 2019-05-07 DIAGNOSIS — E871 Hypo-osmolality and hyponatremia: Secondary | ICD-10-CM | POA: Diagnosis present

## 2019-05-07 DIAGNOSIS — C3492 Malignant neoplasm of unspecified part of left bronchus or lung: Secondary | ICD-10-CM | POA: Diagnosis not present

## 2019-05-07 DIAGNOSIS — R9431 Abnormal electrocardiogram [ECG] [EKG]: Secondary | ICD-10-CM | POA: Diagnosis present

## 2019-05-07 DIAGNOSIS — K580 Irritable bowel syndrome with diarrhea: Secondary | ICD-10-CM | POA: Diagnosis present

## 2019-05-07 DIAGNOSIS — Z888 Allergy status to other drugs, medicaments and biological substances status: Secondary | ICD-10-CM

## 2019-05-07 DIAGNOSIS — J9621 Acute and chronic respiratory failure with hypoxia: Secondary | ICD-10-CM | POA: Diagnosis present

## 2019-05-07 DIAGNOSIS — F419 Anxiety disorder, unspecified: Secondary | ICD-10-CM | POA: Diagnosis present

## 2019-05-07 DIAGNOSIS — Z87891 Personal history of nicotine dependence: Secondary | ICD-10-CM

## 2019-05-07 DIAGNOSIS — Z87440 Personal history of urinary (tract) infections: Secondary | ICD-10-CM

## 2019-05-07 DIAGNOSIS — T451X5A Adverse effect of antineoplastic and immunosuppressive drugs, initial encounter: Secondary | ICD-10-CM | POA: Diagnosis not present

## 2019-05-07 DIAGNOSIS — Z880 Allergy status to penicillin: Secondary | ICD-10-CM

## 2019-05-07 DIAGNOSIS — J9811 Atelectasis: Secondary | ICD-10-CM | POA: Diagnosis present

## 2019-05-07 DIAGNOSIS — F05 Delirium due to known physiological condition: Secondary | ICD-10-CM

## 2019-05-07 DIAGNOSIS — Z1612 Extended spectrum beta lactamase (ESBL) resistance: Secondary | ICD-10-CM | POA: Diagnosis present

## 2019-05-07 DIAGNOSIS — K219 Gastro-esophageal reflux disease without esophagitis: Secondary | ICD-10-CM | POA: Diagnosis present

## 2019-05-07 DIAGNOSIS — D6481 Anemia due to antineoplastic chemotherapy: Secondary | ICD-10-CM | POA: Diagnosis not present

## 2019-05-07 DIAGNOSIS — J9819 Other pulmonary collapse: Secondary | ICD-10-CM | POA: Diagnosis present

## 2019-05-07 DIAGNOSIS — R079 Chest pain, unspecified: Secondary | ICD-10-CM

## 2019-05-07 DIAGNOSIS — Z923 Personal history of irradiation: Secondary | ICD-10-CM

## 2019-05-07 DIAGNOSIS — Z781 Physical restraint status: Secondary | ICD-10-CM | POA: Diagnosis not present

## 2019-05-07 DIAGNOSIS — R112 Nausea with vomiting, unspecified: Secondary | ICD-10-CM | POA: Diagnosis not present

## 2019-05-07 DIAGNOSIS — Z8249 Family history of ischemic heart disease and other diseases of the circulatory system: Secondary | ICD-10-CM

## 2019-05-07 DIAGNOSIS — J432 Centrilobular emphysema: Secondary | ICD-10-CM | POA: Diagnosis present

## 2019-05-07 DIAGNOSIS — Z86010 Personal history of colonic polyps: Secondary | ICD-10-CM

## 2019-05-07 DIAGNOSIS — Z801 Family history of malignant neoplasm of trachea, bronchus and lung: Secondary | ICD-10-CM

## 2019-05-07 DIAGNOSIS — N179 Acute kidney failure, unspecified: Secondary | ICD-10-CM

## 2019-05-07 DIAGNOSIS — M1990 Unspecified osteoarthritis, unspecified site: Secondary | ICD-10-CM | POA: Diagnosis present

## 2019-05-07 DIAGNOSIS — Z9981 Dependence on supplemental oxygen: Secondary | ICD-10-CM

## 2019-05-07 DIAGNOSIS — R109 Unspecified abdominal pain: Secondary | ICD-10-CM

## 2019-05-07 DIAGNOSIS — Z79899 Other long term (current) drug therapy: Secondary | ICD-10-CM

## 2019-05-07 DIAGNOSIS — C778 Secondary and unspecified malignant neoplasm of lymph nodes of multiple regions: Secondary | ICD-10-CM | POA: Diagnosis not present

## 2019-05-07 DIAGNOSIS — E039 Hypothyroidism, unspecified: Secondary | ICD-10-CM | POA: Diagnosis present

## 2019-05-07 DIAGNOSIS — I10 Essential (primary) hypertension: Secondary | ICD-10-CM | POA: Diagnosis present

## 2019-05-07 DIAGNOSIS — Z79890 Hormone replacement therapy (postmenopausal): Secondary | ICD-10-CM

## 2019-05-07 DIAGNOSIS — R000 Tachycardia, unspecified: Secondary | ICD-10-CM

## 2019-05-07 DIAGNOSIS — C3412 Malignant neoplasm of upper lobe, left bronchus or lung: Secondary | ICD-10-CM

## 2019-05-07 DIAGNOSIS — K529 Noninfective gastroenteritis and colitis, unspecified: Secondary | ICD-10-CM

## 2019-05-07 DIAGNOSIS — D696 Thrombocytopenia, unspecified: Secondary | ICD-10-CM | POA: Diagnosis present

## 2019-05-07 DIAGNOSIS — Z881 Allergy status to other antibiotic agents status: Secondary | ICD-10-CM

## 2019-05-07 DIAGNOSIS — R110 Nausea: Secondary | ICD-10-CM

## 2019-05-07 DIAGNOSIS — C771 Secondary and unspecified malignant neoplasm of intrathoracic lymph nodes: Secondary | ICD-10-CM | POA: Diagnosis not present

## 2019-05-07 LAB — COMPREHENSIVE METABOLIC PANEL
ALT: 9 U/L (ref 0–44)
AST: 15 U/L (ref 15–41)
Albumin: 3.8 g/dL (ref 3.5–5.0)
Alkaline Phosphatase: 39 U/L (ref 38–126)
Anion gap: 11 (ref 5–15)
BUN: 13 mg/dL (ref 8–23)
CO2: 23 mmol/L (ref 22–32)
Calcium: 8.6 mg/dL — ABNORMAL LOW (ref 8.9–10.3)
Chloride: 99 mmol/L (ref 98–111)
Creatinine, Ser: 0.92 mg/dL (ref 0.44–1.00)
GFR calc Af Amer: >60 mL/min (ref >60)
GFR calc non Af Amer: >60 mL/min (ref >60)
Glucose, Bld: 105 mg/dL — ABNORMAL HIGH (ref 70–99)
Potassium: 3.5 mmol/L (ref 3.5–5.1)
Sodium: 133 mmol/L — ABNORMAL LOW (ref 135–145)
Total Bilirubin: 0.5 mg/dL (ref 0.3–1.2)
Total Protein: 6.5 g/dL (ref 6.5–8.1)

## 2019-05-07 LAB — TROPONIN I
Troponin I: <0.03 ng/mL (ref <0.03)
Troponin I: <0.03 ng/mL (ref <0.03)

## 2019-05-07 LAB — MAGNESIUM: Magnesium: 1.4 mg/dL — ABNORMAL LOW (ref 1.7–2.4)

## 2019-05-07 LAB — CBC WITH DIFFERENTIAL/PLATELET
Abs Immature Granulocytes: 0.06 10*3/uL (ref 0.00–0.07)
Basophils Absolute: 0 10*3/uL (ref 0.0–0.1)
Basophils Relative: 0 %
Eosinophils Absolute: 0 10*3/uL (ref 0.0–0.5)
Eosinophils Relative: 0 %
HCT: 25.7 % — ABNORMAL LOW (ref 36.0–46.0)
Hemoglobin: 8.7 g/dL — ABNORMAL LOW (ref 12.0–15.0)
Immature Granulocytes: 2 %
Lymphocytes Relative: 24 %
Lymphs Abs: 1 10*3/uL (ref 0.7–4.0)
MCH: 37 pg — ABNORMAL HIGH (ref 26.0–34.0)
MCHC: 33.9 g/dL (ref 30.0–36.0)
MCV: 109.4 fL — ABNORMAL HIGH (ref 80.0–100.0)
Monocytes Absolute: 0.4 10*3/uL (ref 0.1–1.0)
Monocytes Relative: 10 %
Neutro Abs: 2.6 10*3/uL (ref 1.7–7.7)
Neutrophils Relative %: 64 %
Platelets: 137 10*3/uL — ABNORMAL LOW (ref 150–400)
RBC: 2.35 MIL/uL — ABNORMAL LOW (ref 3.87–5.11)
RDW: 16.4 % — ABNORMAL HIGH (ref 11.5–15.5)
WBC: 4 10*3/uL (ref 4.0–10.5)
nRBC: 0 % (ref 0.0–0.2)

## 2019-05-07 LAB — URINALYSIS, ROUTINE W REFLEX MICROSCOPIC
Bilirubin Urine: NEGATIVE
Glucose, UA: NEGATIVE mg/dL
Hgb urine dipstick: NEGATIVE
Ketones, ur: 5 mg/dL — AB
Leukocytes,Ua: NEGATIVE
Nitrite: NEGATIVE
Protein, ur: NEGATIVE mg/dL
Specific Gravity, Urine: 1.018 (ref 1.005–1.030)
pH: 6 (ref 5.0–8.0)

## 2019-05-07 LAB — LIPASE, BLOOD: Lipase: 22 U/L (ref 11–51)

## 2019-05-07 LAB — SARS CORONAVIRUS 2 BY RT PCR (HOSPITAL ORDER, PERFORMED IN ~~LOC~~ HOSPITAL LAB): SARS Coronavirus 2: NEGATIVE

## 2019-05-07 MED ORDER — SODIUM CHLORIDE 0.9 % IV SOLN
1.0000 g | INTRAVENOUS | Status: AC
  Administered 2019-05-08 – 2019-05-10 (×3): 1000 mg via INTRAVENOUS
  Filled 2019-05-07 (×3): qty 1

## 2019-05-07 MED ORDER — PROMETHAZINE HCL 25 MG/ML IJ SOLN
12.5000 mg | Freq: Four times a day (QID) | INTRAMUSCULAR | Status: DC | PRN
  Administered 2019-05-07 – 2019-05-08 (×2): 12.5 mg via INTRAVENOUS
  Filled 2019-05-07 (×3): qty 1

## 2019-05-07 MED ORDER — CHLORHEXIDINE GLUCONATE CLOTH 2 % EX PADS
6.0000 | MEDICATED_PAD | Freq: Every day | CUTANEOUS | Status: DC
  Administered 2019-05-08 – 2019-05-17 (×8): 6 via TOPICAL

## 2019-05-07 MED ORDER — SODIUM CHLORIDE (PF) 0.9 % IJ SOLN
INTRAMUSCULAR | Status: AC
  Filled 2019-05-07: qty 50

## 2019-05-07 MED ORDER — HYDROCODONE-ACETAMINOPHEN 5-325 MG PO TABS
1.0000 | ORAL_TABLET | Freq: Once | ORAL | Status: AC
  Administered 2019-05-07: 1 via ORAL
  Filled 2019-05-07: qty 1

## 2019-05-07 MED ORDER — DICYCLOMINE HCL 20 MG PO TABS
20.0000 mg | ORAL_TABLET | Freq: Three times a day (TID) | ORAL | Status: DC | PRN
  Filled 2019-05-07: qty 1

## 2019-05-07 MED ORDER — PANTOPRAZOLE SODIUM 40 MG PO TBEC
40.0000 mg | DELAYED_RELEASE_TABLET | Freq: Every day | ORAL | Status: DC
  Administered 2019-05-07 – 2019-05-17 (×10): 40 mg via ORAL
  Filled 2019-05-07 (×11): qty 1

## 2019-05-07 MED ORDER — LOPERAMIDE HCL 2 MG PO CAPS
2.0000 mg | ORAL_CAPSULE | ORAL | Status: DC | PRN
  Administered 2019-05-10: 2 mg via ORAL
  Filled 2019-05-07: qty 1

## 2019-05-07 MED ORDER — ONDANSETRON HCL 4 MG/2ML IJ SOLN
4.0000 mg | Freq: Once | INTRAMUSCULAR | Status: AC
  Administered 2019-05-07: 4 mg via INTRAVENOUS
  Filled 2019-05-07: qty 2

## 2019-05-07 MED ORDER — IOHEXOL 300 MG/ML  SOLN
100.0000 mL | Freq: Once | INTRAMUSCULAR | Status: AC | PRN
  Administered 2019-05-07: 100 mL via INTRAVENOUS

## 2019-05-07 MED ORDER — LIDOCAINE VISCOUS HCL 2 % MT SOLN: 15.0000 mL | Freq: Four times a day (QID) | OROMUCOSAL | Status: DC | PRN

## 2019-05-07 MED ORDER — ACETAMINOPHEN 650 MG RE SUPP: 650.0000 mg | Freq: Four times a day (QID) | RECTAL | Status: DC | PRN

## 2019-05-07 MED ORDER — HYDROCODONE-ACETAMINOPHEN 5-325 MG PO TABS
1.0000 | ORAL_TABLET | Freq: Four times a day (QID) | ORAL | Status: DC | PRN
  Administered 2019-05-07 – 2019-05-08 (×2): 1 via ORAL
  Administered 2019-05-12: 21:00:00 2 via ORAL
  Administered 2019-05-14 (×2): 1 via ORAL
  Filled 2019-05-07: qty 2
  Filled 2019-05-07 (×4): qty 1

## 2019-05-07 MED ORDER — ALUM & MAG HYDROXIDE-SIMETH 200-200-20 MG/5ML PO SUSP: 30.0000 mL | Freq: Four times a day (QID) | ORAL | Status: DC | PRN

## 2019-05-07 MED ORDER — ERTAPENEM IV (FOR PTA / DISCHARGE USE ONLY): 1.0000 g | INTRAVENOUS | Status: DC

## 2019-05-07 MED ORDER — POTASSIUM CHLORIDE CRYS ER 20 MEQ PO TBCR
60.0000 meq | EXTENDED_RELEASE_TABLET | Freq: Once | ORAL | Status: AC
  Administered 2019-05-07: 60 meq via ORAL
  Filled 2019-05-07: qty 3

## 2019-05-07 MED ORDER — MORPHINE SULFATE (PF) 2 MG/ML IV SOLN
1.0000 mg | INTRAVENOUS | Status: DC | PRN
  Administered 2019-05-07 – 2019-05-11 (×4): 1 mg via INTRAVENOUS
  Filled 2019-05-07 (×4): qty 1

## 2019-05-07 MED ORDER — LACTATED RINGERS IV SOLN
INTRAVENOUS | Status: DC
  Administered 2019-05-07: 18:00:00 via INTRAVENOUS

## 2019-05-07 MED ORDER — ACETAMINOPHEN 325 MG PO TABS
650.0000 mg | ORAL_TABLET | Freq: Four times a day (QID) | ORAL | Status: DC | PRN
  Administered 2019-05-16 (×2): 650 mg via ORAL
  Filled 2019-05-07 (×2): qty 2

## 2019-05-07 MED ORDER — ENOXAPARIN SODIUM 40 MG/0.4ML ~~LOC~~ SOLN
40.0000 mg | Freq: Every day | SUBCUTANEOUS | Status: DC
  Administered 2019-05-07 – 2019-05-09 (×3): 40 mg via SUBCUTANEOUS
  Filled 2019-05-07 (×3): qty 0.4

## 2019-05-07 NOTE — ED Notes (Signed)
Patient transported to CT 

## 2019-05-07 NOTE — ED Notes (Signed)
Pt placed on 2L [place]

## 2019-05-07 NOTE — ED Notes (Signed)
ED TO INPATIENT HANDOFF REPORT  Name/Age/Gender Rebekah Henderson 63 y.o. female  Code Status    Code Status Orders  (From admission, onward)         Start     Ordered   05/07/19 2149  Full code  Continuous     05/07/19 2154        Code Status History    Date Active Date Inactive Code Status Order ID Comments User Context   05/04/2019 0003 05/05/2019 2347 Full Code [id number]  Vashti Hey, MD ED   01/06/2019 1626 01/08/2019 2157 Full Code [id number]  Lenore Cordia, MD ED   05/28/2017 0000 05/29/2017 1413 Full Code [id number]  Orvan Falconer, MD ED   03/20/2017 2005 03/22/2017 1827 Full Code [id number]  Karmen Bongo, MD Inpatient   01/04/2017 2047 01/06/2017 1642 Full Code [id number]  Vianne Bulls, MD ED   12/07/2016 2155 12/11/2016 1814 Full Code [id number]  Truett Mainland, DO Inpatient   10/29/2016 1352 11/02/2016 1617 Full Code [id number]  Velvet Bathe, MD Inpatient   10/15/2016 1500 10/17/2016 1453 Full Code [id number]  Rexene Alberts, MD Inpatient    Advance Directive Documentation     Most Recent Value  Type of Advance Directive  Healthcare Power of Attorney  Pre-existing out of facility DNR order (yellow form or pink MOST form)  -  "MOST" Form in Place?  -      Home/SNF/Other Home  Chief Complaint Emesis, Flank pain, No appetite  Level of Care/Admitting Diagnosis ED Disposition    ED Disposition Condition [place]: [facility] [[id number]]  Level of Care: Stepdown [14]  Admit to SDU based on following criteria: Respiratory Distress:  Frequent assessment and/or intervention to maintain adequate ventilation/respiration, pulmonary toilet, and respiratory treatment.  Covid Evaluation: Person Under Investigation (PUI)  Isolation Risk Level: Low Risk/Droplet (Less than 4L [place] supplementation)  Diagnosis: Lung collapse [[id number]]  Admitting Physician: Shela Leff [[id number]]  Attending Physician: Shela Leff [[id number]]  Estimated  length of stay: past midnight tomorrow  Certification:: I certify this patient will need inpatient services for at least 2 midnights  PT Class (Do Not Modify): Inpatient [101]  PT Acc Code (Do Not Modify): Private [1]       Medical History Past Medical History:  Diagnosis Date  . 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 Anxiety    takes Prozac daily  . Arthritis   . Benign fundic gland polyps of stomach   . Colon polyps   . COPD (chronic obstructive pulmonary disease) ([place])   . Dehydration 03/06/2017  . Diverticulitis   . Dyspnea    with exertion  . Encounter for antineoplastic chemotherapy 12/03/2016  . Fibromyalgia   . GERD (gastroesophageal reflux disease)    takes Pantoprazole daily  . Hypertension    takes Metoprolol,Triamterene-HCTZ,and Amlodipine daily  . Hypothyroidism    takes Synthroid daily  . IBS (irritable bowel syndrome)   . lung ca dx'd 10/02/2016   skin, lung  . PONV (postoperative nausea and vomiting)    pt also states that she had some difficulty breathing after cervical fusion    Allergies Allergies  Allergen Reactions  . Penicillins Hives, Swelling and Other (See Comments)    Reaction:  Face/mouth swelling  Has patient had a PCN reaction causing immediate rash, facial/tongue/throat swelling, SOB or lightheadedness with hypotension: Yes Has patient had a PCN reaction causing severe rash involving mucus membranes or skin necrosis: No Has patient  had a PCN reaction that required hospitalization No Has patient had a PCN reaction occurring within the last 10 years: No If all of the above answers are "NO", then may proceed with Cephalosporin use.  . Codeine Nausea Only  . Fentanyl Nausea And Vomiting    restless  . Ranitidine Hcl Other (See Comments) and Nausea Only    Reaction:  Dizziness   . Keflex [Cephalexin] Other (See Comments)    Reaction:  Unknown   . Lyrica [Pregabalin] Other (See Comments)    Reaction:  Somnolence     IV  Location/Drains/Wounds Patient Lines/Drains/Airways Status   Active Line/Drains/Airways    Name:   Placement date:   Placement time:   Site:   Days:   Implanted Port 03/04/18 Right Chest   03/04/18    -    Chest   429          Labs/Imaging Results for orders placed or performed during the hospital encounter of 05/07/19 (from the past 48 hour(s))  Comprehensive metabolic panel     Status: Abnormal   Collection Time: 05/07/19  5:06 PM  Result Value Ref Range   Sodium 133 (L) 135 - 145 mmol/L   Potassium 3.5 3.5 - 5.1 mmol/L   Chloride 99 98 - 111 mmol/L   CO2 23 22 - 32 mmol/L   Glucose, Bld 105 (H) 70 - 99 mg/dL   BUN 13 8 - 23 mg/dL   Creatinine, Ser 0.92 0.44 - 1.00 mg/dL   Calcium 8.6 (L) 8.9 - 10.3 mg/dL   Total Protein 6.5 6.5 - 8.1 g/dL   Albumin 3.8 3.5 - 5.0 g/dL   AST 15 15 - 41 U/L   ALT 9 0 - 44 U/L   Alkaline Phosphatase 39 38 - 126 U/L   Total Bilirubin 0.5 0.3 - 1.2 mg/dL   GFR calc non Af Amer >60 >60 mL/min   GFR calc Af Amer >60 >60 mL/min   Anion gap 11 5 - 15    Comment: Performed at [facility], [place] [address], [place], [place] [address]  CBC with Differential     Status: Abnormal   Collection Time: 05/07/19  5:06 PM  Result Value Ref Range   WBC 4.0 4.0 - 10.5 K/uL   RBC 2.35 (L) 3.87 - 5.11 MIL/uL   Hemoglobin 8.7 (L) 12.0 - 15.0 g/dL   HCT 25.7 (L) 36.0 - 46.0 %   MCV 109.4 (H) 80.0 - 100.0 fL   MCH 37.0 (H) 26.0 - 34.0 pg   MCHC 33.9 30.0 - 36.0 g/dL   RDW 16.4 (H) 11.5 - 15.5 %   Platelets 137 (L) 150 - 400 K/uL   nRBC 0.0 0.0 - 0.2 %   Neutrophils Relative % 64 %   Neutro Abs 2.6 1.7 - 7.7 K/uL   Lymphocytes Relative 24 %   Lymphs Abs 1.0 0.7 - 4.0 K/uL   Monocytes Relative 10 %   Monocytes Absolute 0.4 0.1 - 1.0 K/uL   Eosinophils Relative 0 %   Eosinophils Absolute 0.0 0.0 - 0.5 K/uL   Basophils Relative 0 %   Basophils Absolute 0.0 0.0 - 0.1 K/uL   Immature Granulocytes 2 %   Abs Immature Granulocytes 0.06 0.00 -  0.07 K/uL    Comment: Performed at [facility], [place] [address] [place], [place] [address]  Lipase, blood     Status: None   Collection Time: 05/07/19  5:06 PM  Result  Value Ref Range   Lipase 22 11 - 51 U/L    Comment: Performed at [facility], [place] [address], [place], [place] [address]  Troponin I - Once     Status: None   Collection Time: 05/07/19  5:06 PM  Result Value Ref Range   Troponin I <0.03 <0.03 ng/mL    Comment: Performed at [facility], [place] [address] [place], [place] [address]  SARS Coronavirus 2 (CEPHEID - Performed in [place] hospital lab), Hosp Order     Status: None   Collection Time: 05/07/19  5:32 PM  Result Value Ref Range   SARS Coronavirus 2 NEGATIVE NEGATIVE    Comment: (NOTE) If result is NEGATIVE SARS-CoV-2 target nucleic acids are NOT DETECTED. The SARS-CoV-2 RNA is generally detectable in upper and lower  respiratory specimens during the acute phase of infection. The lowest  concentration of SARS-CoV-2 viral copies this assay can detect is 250  copies / mL. A negative result does not preclude SARS-CoV-2 infection  and should not be used as the sole basis for treatment or other  patient management decisions.  A negative result may occur with  improper specimen collection / handling, submission of specimen other  than nasopharyngeal swab, presence of viral mutation(s) within the  areas targeted by this assay, and inadequate number of viral copies  (<250 copies / mL). A negative result must be combined with clinical  observations, patient history, and epidemiological information. If result is POSITIVE SARS-CoV-2 target nucleic acids are DETECTED. The SARS-CoV-2 RNA is generally detectable in upper and lower  respiratory specimens dur ing the acute phase of infection.  Positive  results are indicative of active infection with SARS-CoV-2.  Clinical  correlation with patient history and other  diagnostic information is  necessary to determine patient infection status.  Positive results do  not rule out bacterial infection or co-infection with other viruses. If result is PRESUMPTIVE POSTIVE SARS-CoV-2 nucleic acids MAY BE PRESENT.   A presumptive positive result was obtained on the submitted specimen  and confirmed on repeat testing.  While 2019 novel coronavirus  (SARS-CoV-2) nucleic acids may be present in the submitted sample  additional confirmatory testing may be necessary for epidemiological  and / or clinical management purposes  to differentiate between  SARS-CoV-2 and other Sarbecovirus currently known to infect humans.  If clinically indicated additional testing with an alternate test  methodology [phone]) is advised. The SARS-CoV-2 RNA is generally  detectable in upper and lower respiratory sp ecimens during the acute  phase of infection. The expected result is Negative. Fact Sheet for Patients:  StrictlyIdeas.no Fact Sheet for Healthcare Providers: BankingDealers.co.za This test is not yet approved or cleared by the [place] FDA and has been authorized for detection and/or diagnosis of SARS-CoV-2 by FDA under an Emergency Use Authorization (EUA).  This EUA will remain in effect (meaning this test can be used) for the duration of the COVID-19 declaration under Section 564(b)(1) of the Act, 21 U.S.C. section 360bbb-3(b)(1), unless the authorization is terminated or revoked sooner. Performed at [facility], [place] [address] [place], [place] [address]   Urinalysis, Routine w reflex microscopic     Status: Abnormal   Collection Time: 05/07/19  7:59 PM  Result Value Ref Range   Color, Urine YELLOW YELLOW   APPearance CLEAR CLEAR   Specific Gravity, Urine 1.018 1.005 - 1.030   pH 6.0 5.0 - 8.0   Glucose, UA NEGATIVE NEGATIVE mg/dL  Hgb urine dipstick NEGATIVE NEGATIVE   Bilirubin Urine  NEGATIVE NEGATIVE   Ketones, ur 5 (A) NEGATIVE mg/dL   Protein, ur NEGATIVE NEGATIVE mg/dL   Nitrite NEGATIVE NEGATIVE   Leukocytes,Ua NEGATIVE NEGATIVE    Comment: Performed at [place] [address], [place], [place] [address]   Ct Chest W Contrast  Result Date: 05/07/2019 CLINICAL DATA:  Lung cancer, UTI, chest pain, abdominal pain, vomiting EXAM: CT CHEST, ABDOMEN, AND PELVIS WITH CONTRAST TECHNIQUE: Multidetector CT imaging of the chest, abdomen and pelvis was performed following the standard protocol during bolus administration of intravenous contrast. CONTRAST:  153mL OMNIPAQUE IOHEXOL 300 MG/ML  SOLN COMPARISON:  Same day chest radiograph, CT chest abdomen pelvis, 04/09/2019 FINDINGS: CT CHEST FINDINGS Cardiovascular: No significant vascular findings. Normal heart size. No pericardial effusion. Right chest port catheter. Mediastinum/Nodes: Leftward shift of the mediastinum. Slight interval increase in size of a left hilar and mediastinal mass about the left mainstem bronchus, with increased mass effect on the left mainstem bronchus. Enlarged subcarinal lymph node and involvement of the midportion of the esophagus by mass. Normal thyroid. Small hiatal hernia. Lungs/Pleura: Near total atelectasis of the left lung secondary to left mainstem bronchial obstruction with a small pleural effusion. Slightly increased ground-glass opacity of the right upper lobe (series 4, image 50). Mild emphysema. Musculoskeletal: No chest wall mass. CT ABDOMEN PELVIS FINDINGS Hepatobiliary: No focal liver abnormality is seen. Status post cholecystectomy with postoperative biliary ductal dilatation. Pancreas: Unremarkable. No pancreatic ductal dilatation or surrounding inflammatory changes. Spleen: Normal in size without significant abnormality. Adrenals/Urinary Tract: Adrenal glands are unremarkable. Kidneys are normal, without renal calculi, solid lesion, or hydronephrosis. Bladder is unremarkable.  Stomach/Bowel: Stomach is within normal limits. Appendix appears normal. No evidence of bowel wall thickening, distention, or inflammatory changes. Sigmoid diverticulosis. Vascular/Lymphatic: Calcific atherosclerosis. No enlarged abdominal or pelvic lymph nodes. Reproductive: Status post hysterectomy. Other: No abdominal wall hernia or abnormality. No abdominopelvic ascites. Musculoskeletal: Unchanged sclerotic lesion of the L3 vertebral body. IMPRESSION: 1. Slight interval increase in size of a left hilar and mediastinal mass about the left mainstem bronchus, with increased mass effect on the left mainstem bronchus. Near total atelectasis of the left lung secondary to left mainstem bronchial obstruction with a small pleural effusion. 2. Slightly increased infectious or inflammatory ground-glass opacity of the right upper lobe (series 4, image 50). Mild emphysema. 3.  Unchanged sclerotic lesion of the L3 vertebral body. 4. Other chronic, incidental, and postoperative findings as detailed above. Electronically Signed   By: Eddie Candle M.D.   On: 05/07/2019 19:53   Ct Abdomen Pelvis W Contrast  Result Date: 05/07/2019 CLINICAL DATA:  Lung cancer, UTI, chest pain, abdominal pain, vomiting EXAM: CT CHEST, ABDOMEN, AND PELVIS WITH CONTRAST TECHNIQUE: Multidetector CT imaging of the chest, abdomen and pelvis was performed following the standard protocol during bolus administration of intravenous contrast. CONTRAST:  156mL OMNIPAQUE IOHEXOL 300 MG/ML  SOLN COMPARISON:  Same day chest radiograph, CT chest abdomen pelvis, 04/09/2019 FINDINGS: CT CHEST FINDINGS Cardiovascular: No significant vascular findings. Normal heart size. No pericardial effusion. Right chest port catheter. Mediastinum/Nodes: Leftward shift of the mediastinum. Slight interval increase in size of a left hilar and mediastinal mass about the left mainstem bronchus, with increased mass effect on the left mainstem bronchus. Enlarged subcarinal lymph node  and involvement of the midportion of the esophagus by mass. Normal thyroid. Small hiatal hernia. Lungs/Pleura: Near total atelectasis of the left lung secondary to left mainstem bronchial obstruction  with a small pleural effusion. Slightly increased ground-glass opacity of the right upper lobe (series 4, image 50). Mild emphysema. Musculoskeletal: No chest wall mass. CT ABDOMEN PELVIS FINDINGS Hepatobiliary: No focal liver abnormality is seen. Status post cholecystectomy with postoperative biliary ductal dilatation. Pancreas: Unremarkable. No pancreatic ductal dilatation or surrounding inflammatory changes. Spleen: Normal in size without significant abnormality. Adrenals/Urinary Tract: Adrenal glands are unremarkable. Kidneys are normal, without renal calculi, solid lesion, or hydronephrosis. Bladder is unremarkable. Stomach/Bowel: Stomach is within normal limits. Appendix appears normal. No evidence of bowel wall thickening, distention, or inflammatory changes. Sigmoid diverticulosis. Vascular/Lymphatic: Calcific atherosclerosis. No enlarged abdominal or pelvic lymph nodes. Reproductive: Status post hysterectomy. Other: No abdominal wall hernia or abnormality. No abdominopelvic ascites. Musculoskeletal: Unchanged sclerotic lesion of the L3 vertebral body. IMPRESSION: 1. Slight interval increase in size of a left hilar and mediastinal mass about the left mainstem bronchus, with increased mass effect on the left mainstem bronchus. Near total atelectasis of the left lung secondary to left mainstem bronchial obstruction with a small pleural effusion. 2. Slightly increased infectious or inflammatory ground-glass opacity of the right upper lobe (series 4, image 50). Mild emphysema. 3.  Unchanged sclerotic lesion of the L3 vertebral body. 4. Other chronic, incidental, and postoperative findings as detailed above. Electronically Signed   By: Eddie Candle M.D.   On: 05/07/2019 19:53   Dg Chest Port 1 View  Result Date:  05/07/2019 CLINICAL DATA:  UTI, lung cancer, emesis EXAM: PORTABLE CHEST 1 VIEW COMPARISON:  04/03/2019 FINDINGS: There is total opacification of the left hemithorax. The cardiac and mediastinal borders are obscured. Right chest port catheter. IMPRESSION: There is total opacification of the left hemithorax, concerning for large left pleural effusion and/or left mainstem bronchial obstruction by hilar mass. Recommend CT to further evaluate. Electronically Signed   By: Eddie Candle M.D.   On: 05/07/2019 17:31    Pending Labs Unresulted Labs (From admission, onward)    Start     Ordered   05/08/19 0500  CBC  Tomorrow morning,   R     05/07/19 2154   05/08/19 5035  Basic metabolic panel  Tomorrow morning,   R     05/07/19 2154   05/07/19 2153  Magnesium  Add-on,   R     05/07/19 2154   05/07/19 2151  Troponin I - Now Then Q3H  Now then every 3 hours,   R     05/07/19 2154   05/07/19 2150  Procalcitonin - Baseline  ONCE - STAT,   STAT     05/07/19 2154   05/07/19 1706  Urine culture  ONCE - STAT,   STAT     05/07/19 1707          Vitals/Pain Today's Vitals   05/07/19 2130 05/07/19 2200 05/07/19 2230 05/07/19 2244  BP: (!) 154/143 105/89 (!) 130/91   Pulse: 84 71 71   Resp: (!) 25 20 20    Temp:      TempSrc:      SpO2: 94% 97% 98%   Weight:      Height:      PainSc:   6  6     Isolation Precautions No active isolations  Medications Medications  lactated ringers infusion ( Intravenous New Bag/Given 05/07/19 1739)  sodium chloride (PF) 0.9 % injection (0 mLs  Hold 05/07/19 2051)  enoxaparin (LOVENOX) injection 40 mg (has no administration in time range)  acetaminophen (TYLENOL) tablet 650 mg (has no administration in  time range)    Or  acetaminophen (TYLENOL) suppository 650 mg (has no administration in time range)  promethazine (PHENERGAN) injection 12.5 mg (has no administration in time range)  potassium chloride SA (K-DUR) CR tablet 60 mEq (has no administration in time range)   morphine 2 MG/ML injection 1 mg (1 mg Intravenous Given 05/07/19 2242)  HYDROcodone-acetaminophen (NORCO/VICODIN) 5-325 MG per tablet 1-2 tablet (has no administration in time range)  ertapenem (INVANZ) 1,000 mg in sodium chloride 0.9 % 100 mL IVPB (has no administration in time range)  dicyclomine (BENTYL) tablet 20 mg (has no administration in time range)  loperamide (IMODIUM) capsule 2 mg (has no administration in time range)  alum & mag hydroxide-simeth (MAALOX/MYLANTA) 200-200-20 MG/5ML suspension 30 mL (has no administration in time range)    And  lidocaine (XYLOCAINE) 2 % viscous mouth solution 15 mL (has no administration in time range)  pantoprazole (PROTONIX) EC tablet 40 mg (has no administration in time range)  ondansetron (ZOFRAN) injection 4 mg (4 mg Intravenous Given 05/07/19 1736)  HYDROcodone-acetaminophen (NORCO/VICODIN) 5-325 MG per tablet 1 tablet (1 tablet Oral Given 05/07/19 1736)  iohexol (OMNIPAQUE) 300 MG/ML solution 100 mL (100 mLs Intravenous Contrast Given 05/07/19 1928)    Mobility walks

## 2019-05-07 NOTE — H&P (Signed)
History and Physical    Rebekah Henderson SMO:[id number] DOB: 01-06-1956 DOA: 05/07/2019  PCP: Redmond School, MD Patient coming from: Home  Chief Complaint: Emesis  HPI: Rebekah Henderson is a 63 y.o. 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 Patient was recently admitted on June 1 for ESBL UTI/pyelonephritis and treated with meropenem.  She was converted to ertapenem on June 3 and discharged home for ongoing dosages until June 8.  Patient states she has been receiving chemotherapy for several years and has had intermittent nausea and vomiting for several years as well.  States today she vomited after drinking ginger ale.  She is also been having left lower quadrant abdominal pain for several years.  Also reports having intermittent diarrhea for several years, most recent episode 2 days ago.  States she has been seen at [facility] and another outside hospital and was told her symptoms were due to IBS, acid reflux, and fibromyalgia.  She is also complaining of some pain in her lower sternal and upper abdominal region which she describes as a gurgling sensation.  Also reports having acid reflux.  States for the past few days she has been having sharp left-sided chest pain.  Chest pain is nonexertional.  Reports having chronic shortness of breath, no recent change.  She uses 2 L home oxygen all the time.  ED Course: Afebrile.  No leukocytosis.  Lipase and LFTs normal.  Troponin negative.  COVID-19 rapid test pending.  UA negative for nitrite and leukocyte esterase.  Urine culture pending. Chest x-ray showing total opacification of the left hemithorax, concerning for large left pleural effusion and/or left mainstem bronchial obstruction by hilar mass. Chest CT showing slight interval increase in size of the left hilar and mediastinal mass about the left  mainstem bronchus, with increased mass-effect on the left mainstem bronchus.  Near-total atelectasis of the left lung secondary to left mainstem bronchial obstruction with a small pleural effusion.  Also showing slightly increased infectious or inflammatory groundglass opacity of the right upper lobe. CT abdomen negative for acute abnormality. Patient received hydrocodone-acetaminophen, Zofran, and IV fluid in the ED.   Review of Systems:  All systems reviewed and apart from history of presenting illness, are negative.  Past Medical History:  Diagnosis Date   Antineoplastic chemotherapy induced anemia 12/03/2016   Anxiety    takes Prozac daily   Arthritis    Benign fundic gland polyps of stomach    Colon polyps    COPD (chronic obstructive pulmonary disease) ([place])    Dehydration 03/06/2017   Diverticulitis    Dyspnea    with exertion   Encounter for antineoplastic chemotherapy 12/03/2016   Fibromyalgia    GERD (gastroesophageal reflux disease)    takes Pantoprazole daily   Hypertension    takes Metoprolol,Triamterene-HCTZ,and Amlodipine daily   Hypothyroidism    takes Synthroid daily   IBS (irritable bowel syndrome)    lung ca dx'd 10/02/2016   skin, lung   PONV (postoperative nausea and vomiting)    pt also states that she had some difficulty breathing after cervical fusion    Past Surgical History:  Procedure Laterality Date   BIOPSY N/A 05/25/2013   Procedure: BIOPSIES (Random Colon; Duodenal; Gastric);  Surgeon: Danie Binder, MD;  Location: [facility];  Service: Endoscopy;  Laterality: N/A;   BLADDER SUSPENSION     BREAST ENHANCEMENT SURGERY  BREAST IMPLANT REMOVAL     CERVICAL FUSION  AUG 2013   CHOLECYSTECTOMY  1999   COLONOSCOPY  2007 [place]   POLYPS   COLONOSCOPY WITH PROPOFOL N/A 05/25/2013   Procedure: COLONOSCOPY WITH PROPOFOL(at cecum 0957) total withdrawal time=64mn);  Surgeon: SDanie Binder MD;  Location: [facility];  Service: Endoscopy;   Laterality: N/A;   ESOPHAGOGASTRODUODENOSCOPY (EGD) WITH PROPOFOL N/A 05/25/2013   Procedure: ESOPHAGOGASTRODUODENOSCOPY (EGD) WITH PROPOFOL;  Surgeon: SDanie Binder MD;  Location: [facility];  Service: Endoscopy;  Laterality: N/A;   FOOT SURGERY     IR FLUORO GUIDE PORT INSERTION RIGHT  03/04/2018   IR [place] VASC ACCESS RIGHT  03/04/2018   POLYPECTOMY N/A 05/25/2013   Procedure: POLYPECTOMY (Rectal and Gastric);  Surgeon: SDanie Binder MD;  Location: [facility];  Service: Endoscopy;  Laterality: N/A;   TONSILLECTOMY     UPPER GASTROINTESTINAL ENDOSCOPY     VIDEO BRONCHOSCOPY WITH ENDOBRONCHIAL ULTRASOUND  09/12/2016   Procedure: VIDEO BRONCHOSCOPY WITH ENDOBRONCHIAL ULTRASOUND AND BIOPSY;  Surgeon: DJuanito Doom MD;  Location: [facility]  Service: Cardiopulmonary;;     reports that she quit smoking about 14 years ago. She has a 40.00 pack-year smoking history. She has never used smokeless tobacco. She reports that she does not drink alcohol or use drugs.  Allergies  Allergen Reactions   Penicillins Hives, Swelling and Other (See Comments)    Reaction:  Face/mouth swelling  Has patient had a PCN reaction causing immediate rash, facial/tongue/throat swelling, SOB or lightheadedness with hypotension: Yes Has patient had a PCN reaction causing severe rash involving mucus membranes or skin necrosis: No Has patient had a PCN reaction that required hospitalization No Has patient had a PCN reaction occurring within the last 10 years: No If all of the above answers are "NO", then may proceed with Cephalosporin use.   Codeine Nausea Only   Fentanyl Nausea And Vomiting    restless   Ranitidine Hcl Other (See Comments) and Nausea Only    Reaction:  Dizziness    Keflex [Cephalexin] Other (See Comments)    Reaction:  Unknown    Lyrica [Pregabalin] Other (See Comments)    Reaction:  Somnolence     Family History  Problem Relation Age of Onset   Breast cancer Mother    Diabetes  Maternal Grandfather    Lung cancer Father    Heart failure Sister 413      Died. Morbidly obese   Colon cancer Neg Hx    Colon polyps Neg Hx     Prior to Admission medications   Medication Sig Start Date End Date Taking? Authorizing Provider  albuterol (PROVENTIL HFA;VENTOLIN HFA) 108 (90 Base) MCG/ACT inhaler Inhale 2 puffs into the lungs every 4 (four) hours as needed for wheezing or shortness of breath.    [provider]  ALPRAZolam (Duanne Moron 0.25 MG tablet Take 2 tablets (0.5 mg total) by mouth at bedtime as needed for anxiety. Patient taking differently: Take 0.25-0.5 mg by mouth daily as needed for anxiety or sleep.  02/11/18   MCurt Bears MD  amLODipine (NORVASC) 5 MG tablet Take 5 mg by mouth daily.      [provider]  cyclobenzaprine (FLEXERIL) 5 MG tablet TAKE ONE TABLET 3 TIMES A DAY AS NEEDED. Patient taking differently: Take 5 mg by mouth 3 (three) times daily as needed for muscle spasms.  03/31/19   Tanner, VLyndon Code, PA-C  dicyclomine (BENTYL) 20 MG tablet Take 20  mg by mouth 3 (three) times daily as needed for spasms.    [provider]  diphenoxylate-atropine (LOMOTIL) 2.5-0.025 MG tablet TAKE 1 TABLET BY MOUTH 4 TIMES DAILY AS NEEDED FOR LOOSE STOOLS. Patient taking differently: Take 1 tablet by mouth 4 (four) times daily as needed for diarrhea or loose stools.  04/20/19   Heilingoetter, Cassandra L, PA-C  ertapenem (INVANZ) IVPB Inject 1 g into the vein daily for 5 days. Indication:  ESBL UTI Last Day of Therapy:  05/10/2019 Labs - Once weekly:  CBC/D and BMP, Labs - Every other week:  ESR and CRP 05/06/19 05/11/19  Manuella Ghazi, Pratik D, DO  estazolam (PROSOM) 2 MG tablet Take 2 mg by mouth at bedtime.    [provider]  estradiol (ESTRACE) 2 MG tablet Take 2 mg by mouth daily.      [provider]  FLUoxetine (PROZAC) 40 MG capsule Take 40 mg by mouth daily.     [provider]  HYDROcodone-acetaminophen (NORCO/VICODIN)  5-325 MG tablet Take 1 tablet by mouth every 4 (four) hours as needed for moderate pain.     [provider]  levothyroxine (SYNTHROID, LEVOTHROID) 50 MCG tablet Take 50 mcg by mouth daily before breakfast.     [provider]  lidocaine-prilocaine (EMLA) cream Apply 1 application topically as needed. To numb skin over port a cath: Squeeze a  small amount on cotton ball and place over port site 1-2 hours prior to chemotherapy. 02/18/18   Curt Bears, MD  loperamide (IMODIUM) 2 MG capsule Take 2 mg by mouth every 2 (two) hours as needed for diarrhea or loose stools.    [provider]  magnesium oxide (MAG-OX) 400 (241.3 Mg) MG tablet Take 1 tablet (400 mg total) by mouth 2 (two) times daily. 11/12/18   Maryanna Shape, NP  metoprolol succinate (TOPROL-XL) 25 MG 24 hr tablet Take 25 mg by mouth daily.    [provider]  ondansetron (ZOFRAN-ODT) 4 MG disintegrating tablet Dissolve one tablet by mouth every 8 hours as needed for nausea and vomiting, 02/22/19   Curt Bears, MD  OXYGEN Inhale 2 L into the lungs daily as needed (for shortness of breath).    [provider]  pantoprazole (PROTONIX) 40 MG tablet Take 40 mg by mouth 2 (two) times daily before a meal.     [provider]  phenazopyridine (AZO-TABS) 95 MG tablet Take 190 mg by mouth 2 (two) times a day.    [provider]  potassium chloride SA (K-DUR,KLOR-CON) 20 MEQ tablet Take 20 mEq by mouth daily as needed (for supplement).    [provider]  promethazine (PHENERGAN) 25 MG tablet TAKE ONE TABLET BY MOUTH EVERY 6 HOURS AS NEEDED. Patient taking differently: Take 25 mg by mouth every 6 (six) hours as needed for nausea or vomiting.  12/18/18   Curt Bears, MD  senna-docusate (SENOKOT-S) 8.6-50 MG tablet Take 1 tablet by mouth daily as needed for mild constipation or moderate constipation.  07/17/12   [provider]    Physical Exam: Vitals:    05/07/19 2100 05/07/19 2130 05/07/19 2200 05/07/19 2230  BP: (!) 143/128 (!) 154/143 105/89 (!) 130/91  Pulse: 83 84 71 71  Resp: 17 (!) _0 Temp:      TempSrc:      SpO2: 97% 94% 97% 98%  Weight:      Height:        Physical Exam  Constitutional:  She is oriented to person, place, and time. She appears well-developed and well-nourished. No distress.  Resting comfortably in a stretcher  HENT:  Head: Normocephalic.  Mouth/Throat: Oropharynx is clear and moist.  Eyes: Right eye exhibits no discharge. Left eye exhibits no discharge.  Neck: Neck supple.  Cardiovascular: Normal rate, regular rhythm and intact distal pulses.  Pulmonary/Chest: She has no wheezes. She has no rales.  No tachypnea No increased work of breathing Speaking clearly in full sentences On 2 L supplemental oxygen (same as home requirement) Diminished breath sounds appreciated in the entire left lung field  Abdominal: Soft. Bowel sounds are normal. She exhibits no distension. There is no abdominal tenderness. There is no rebound and no guarding.  Musculoskeletal:        General: No edema.  Neurological: She is alert and oriented to person, place, and time.  Skin: Skin is warm and dry. She is not diaphoretic.     Labs on Admission: I have personally reviewed following labs and imaging studies  CBC: Recent Labs  Lab 05/03/19 1855 05/04/19 0517 05/05/19 0508 05/07/19 1706  WBC 3.5* 3.7* 3.0* 4.0  NEUTROABS 1.8  --   --  2.6  HGB 9.0* 9.3* 8.8* 8.7*  HCT 27.4* 28.5* 26.5* 25.7*  MCV 110.9* 109.6* 109.1* 109.4*  PLT 118* 116* 121* 665*   Basic Metabolic Panel: Recent Labs  Lab 05/03/19 1855 05/04/19 0517 05/05/19 0508 05/07/19 1706 05/07/19 2150  NA 139 142 138 133*  --   K 3.3* 4.4 3.9 3.5  --   CL 102 105 103 99  --   CO2 _0 --   GLUCOSE 98 87 119* 105*  --   BUN _1 --   CREATININE 1.09* 1.04* 0.96 0.92  --   CALCIUM 8.9 8.9 8.8* 8.6*  --   MG  --   --  1.4*  --   1.4*   GFR: Estimated Creatinine Clearance: 57.7 mL/min (by C-G formula based on SCr of 0.92 mg/dL). Liver Function Tests: Recent Labs  Lab 05/07/19 1706  AST 15  ALT 9  ALKPHOS 39  BILITOT 0.5  PROT 6.5  ALBUMIN 3.8   Recent Labs  Lab 05/07/19 1706  LIPASE 22   No results for input(s): AMMONIA in the last 168 hours. Coagulation Profile: No results for input(s): INR, PROTIME in the last 168 hours. Cardiac Enzymes: Recent Labs  Lab 05/07/19 1706 05/07/19 2150  TROPONINI <0.03 <0.03   BNP (last 3 results) No results for input(s): PROBNP in the last 8760 hours. HbA1C: No results for input(s): HGBA1C in the last 72 hours. CBG: No results for input(s): GLUCAP in the last 168 hours. Lipid Profile: No results for input(s): CHOL, HDL, LDLCALC, TRIG, CHOLHDL, LDLDIRECT in the last 72 hours. Thyroid Function Tests: No results for input(s): TSH, T4TOTAL, FREET4, T3FREE, THYROIDAB in the last 72 hours. Anemia Panel: No results for input(s): VITAMINB12, FOLATE, FERRITIN, TIBC, IRON, RETICCTPCT in the last 72 hours. Urine analysis:    Component Value Date/Time   COLORURINE YELLOW 05/07/2019 1959   APPEARANCEUR CLEAR 05/07/2019 1959   LABSPEC 1.018 05/07/2019 1959   PHURINE 6.0 05/07/2019 1959   GLUCOSEU NEGATIVE 05/07/2019 1959   HGBUR NEGATIVE 05/07/2019 1959   BILIRUBINUR NEGATIVE 05/07/2019 1959   KETONESUR 5 (A) 05/07/2019 1959   PROTEINUR NEGATIVE 05/07/2019 1959   UROBILINOGEN 0.2 03/10/2014 1027   NITRITE NEGATIVE 05/07/2019 1959   LEUKOCYTESUR NEGATIVE 05/07/2019 1959  Radiological Exams on Admission: Ct Chest W Contrast  Result Date: 05/07/2019 CLINICAL DATA:  Lung cancer, UTI, chest pain, abdominal pain, vomiting EXAM: CT CHEST, ABDOMEN, AND PELVIS WITH CONTRAST TECHNIQUE: Multidetector CT imaging of the chest, abdomen and pelvis was performed following the standard protocol during bolus administration of intravenous contrast. CONTRAST:  139m OMNIPAQUE  IOHEXOL 300 MG/ML  SOLN COMPARISON:  Same day chest radiograph, CT chest abdomen pelvis, 04/09/2019 FINDINGS: CT CHEST FINDINGS Cardiovascular: No significant vascular findings. Normal heart size. No pericardial effusion. Right chest port catheter. Mediastinum/Nodes: Leftward shift of the mediastinum. Slight interval increase in size of a left hilar and mediastinal mass about the left mainstem bronchus, with increased mass effect on the left mainstem bronchus. Enlarged subcarinal lymph node and involvement of the midportion of the esophagus by mass. Normal thyroid. Small hiatal hernia. Lungs/Pleura: Near total atelectasis of the left lung secondary to left mainstem bronchial obstruction with a small pleural effusion. Slightly increased ground-glass opacity of the right upper lobe (series 4, image 50). Mild emphysema. Musculoskeletal: No chest wall mass. CT ABDOMEN PELVIS FINDINGS Hepatobiliary: No focal liver abnormality is seen. Status post cholecystectomy with postoperative biliary ductal dilatation. Pancreas: Unremarkable. No pancreatic ductal dilatation or surrounding inflammatory changes. Spleen: Normal in size without significant abnormality. Adrenals/Urinary Tract: Adrenal glands are unremarkable. Kidneys are normal, without renal calculi, solid lesion, or hydronephrosis. Bladder is unremarkable. Stomach/Bowel: Stomach is within normal limits. Appendix appears normal. No evidence of bowel wall thickening, distention, or inflammatory changes. Sigmoid diverticulosis. Vascular/Lymphatic: Calcific atherosclerosis. No enlarged abdominal or pelvic lymph nodes. Reproductive: Status post hysterectomy. Other: No abdominal wall hernia or abnormality. No abdominopelvic ascites. Musculoskeletal: Unchanged sclerotic lesion of the L3 vertebral body. IMPRESSION: 1. Slight interval increase in size of a left hilar and mediastinal mass about the left mainstem bronchus, with increased mass effect on the left mainstem bronchus.  Near total atelectasis of the left lung secondary to left mainstem bronchial obstruction with a small pleural effusion. 2. Slightly increased infectious or inflammatory ground-glass opacity of the right upper lobe (series 4, image 50). Mild emphysema. 3.  Unchanged sclerotic lesion of the L3 vertebral body. 4. Other chronic, incidental, and postoperative findings as detailed above. Electronically Signed   By: AEddie CandleM.D.   On: 05/07/2019 19:53   Ct Abdomen Pelvis W Contrast  Result Date: 05/07/2019 CLINICAL DATA:  Lung cancer, UTI, chest pain, abdominal pain, vomiting EXAM: CT CHEST, ABDOMEN, AND PELVIS WITH CONTRAST TECHNIQUE: Multidetector CT imaging of the chest, abdomen and pelvis was performed following the standard protocol during bolus administration of intravenous contrast. CONTRAST:  1024mOMNIPAQUE IOHEXOL 300 MG/ML  SOLN COMPARISON:  Same day chest radiograph, CT chest abdomen pelvis, 04/09/2019 FINDINGS: CT CHEST FINDINGS Cardiovascular: No significant vascular findings. Normal heart size. No pericardial effusion. Right chest port catheter. Mediastinum/Nodes: Leftward shift of the mediastinum. Slight interval increase in size of a left hilar and mediastinal mass about the left mainstem bronchus, with increased mass effect on the left mainstem bronchus. Enlarged subcarinal lymph node and involvement of the midportion of the esophagus by mass. Normal thyroid. Small hiatal hernia. Lungs/Pleura: Near total atelectasis of the left lung secondary to left mainstem bronchial obstruction with a small pleural effusion. Slightly increased ground-glass opacity of the right upper lobe (series 4, image 50). Mild emphysema. Musculoskeletal: No chest wall mass. CT ABDOMEN PELVIS FINDINGS Hepatobiliary: No focal liver abnormality is seen. Status post cholecystectomy with postoperative biliary ductal dilatation. Pancreas: Unremarkable. No pancreatic ductal dilatation or surrounding  inflammatory changes. Spleen:  Normal in size without significant abnormality. Adrenals/Urinary Tract: Adrenal glands are unremarkable. Kidneys are normal, without renal calculi, solid lesion, or hydronephrosis. Bladder is unremarkable. Stomach/Bowel: Stomach is within normal limits. Appendix appears normal. No evidence of bowel wall thickening, distention, or inflammatory changes. Sigmoid diverticulosis. Vascular/Lymphatic: Calcific atherosclerosis. No enlarged abdominal or pelvic lymph nodes. Reproductive: Status post hysterectomy. Other: No abdominal wall hernia or abnormality. No abdominopelvic ascites. Musculoskeletal: Unchanged sclerotic lesion of the L3 vertebral body. IMPRESSION: 1. Slight interval increase in size of a left hilar and mediastinal mass about the left mainstem bronchus, with increased mass effect on the left mainstem bronchus. Near total atelectasis of the left lung secondary to left mainstem bronchial obstruction with a small pleural effusion. 2. Slightly increased infectious or inflammatory ground-glass opacity of the right upper lobe (series 4, image 50). Mild emphysema. 3.  Unchanged sclerotic lesion of the L3 vertebral body. 4. Other chronic, incidental, and postoperative findings as detailed above. Electronically Signed   By: Eddie Candle M.D.   On: 05/07/2019 19:53   Dg Chest Port 1 View  Result Date: 05/07/2019 CLINICAL DATA:  UTI, lung cancer, emesis EXAM: PORTABLE CHEST 1 VIEW COMPARISON:  04/03/2019 FINDINGS: There is total opacification of the left hemithorax. The cardiac and mediastinal borders are obscured. Right chest port catheter. IMPRESSION: There is total opacification of the left hemithorax, concerning for large left pleural effusion and/or left mainstem bronchial obstruction by hilar mass. Recommend CT to further evaluate. Electronically Signed   By: Eddie Candle M.D.   On: 05/07/2019 17:31    EKG: Independently reviewed.  Sinus rhythm (heart rate 84), borderline T wave inversions in anterior  leads.  QTc 509.  Assessment/Plan Principal Problem:   Lung collapse Active Problems:   Small cell lung cancer, left (HCC)   Chronic abdominal pain   Chronic nausea   Chronic diarrhea   Lung collapse secondary to left mainstem bronchial obstruction from a lung mass in the setting of small cell lung cancer currently on chemo Currently on 2 L supplemental oxygen which is the same as her home requirement.  No signs of respiratory distress. Chest CT showing slight interval increase in size of the left hilar and mediastinal mass about the left mainstem bronchus, with increased mass-effect on the left mainstem bronchus.  Near-total atelectasis of the left lung secondary to left mainstem bronchial obstruction with a small pleural effusion.  Also showing slightly increased infectious or inflammatory groundglass opacity of the right upper lobe.  Infection less likely given no fever or leukocytosis.  Procalcitonin is not elevated.  COVID-19 rapid test negative. -IV morphine as needed for chest pain, Norco PRN -Supplemental oxygen -Incentive spirometry -Pulmonology has been consulted, recommendations pending -Please consult oncology in the morning  Chronic nausea, vomiting, abdominal pain, diarrhea Afebrile.  No leukocytosis.  Lipase and LFTs normal. CT abdomen negative for acute abnormality.  Suspect her symptoms are due to combination of factors such as current treatment with chemotherapy, GERD, and underlying IBS.  No vomiting or diarrhea since she has been in the hospital.  Abdominal exam benign.  Appears comfortable on exam. -IV fluid hydration -Phenergan PRN nausea -PPI -GI cocktail PRN -Continue home Bentyl -Loperamide PRN  Chest pain Patient is complaining of nonexertional sharp left-sided chest pain.  Appears comfortable on exam.  EKG with borderline T wave inversions in anterior leads.  Initial troponin negative.  Chest pain and EKG changes could possibly be due to demand ischemia from  her pulmonary  issue.  PE less likely given no tachycardia, tachypnea, or increased oxygen requirement from baseline. -Cardiac monitoring -Trend troponin -Serial EKGs  Recent ESBL UTI Afebrile no leukocytosis.  UA negative for nitrite and leukocyte esterase.  Microscopic examination has not been done by the lab. -Continue ertapenem -Urine culture pending.  Mild hyponatremia Sodium 133. -IV fluid hydration -Continue to monitor BMP  Chronic macrocytic anemia -Stable.  Hemoglobin 8.7, baseline in the 8-9 range.  Chronic thrombocytopenia -Stable.  Platelets 137,000, improved since prior labs.  QTC prolongation on EKG -Cardiac monitoring -Keep K >4 and Mag >2 -Serial EKGs as above -Avoid QTC prolonging drugs if possible.  Low-dose PRN antiemetic has currently been ordered as options are limited.  COPD Stable.  No wheezing or cough.  Albuterol rescue inhaler is only medication listed in her home meds.   -DuoNebs PRN.   Hypertension -Continue home metoprolol  Hypothyroidism -Continue home Synthroid  Depression and anxiety -Continue home Prozac, Xanax  DVT prophylaxis: Lovenox Code Status: Patient wishes to be full code. Family Communication: No family available. Disposition Plan: Anticipate discharge after clinical improvement. Consults called: PCCM (Dr. Jimmy Footman) Admission status: It is my clinical opinion that admission to INPATIENT is reasonable and necessary in this 63 y.o. female  presenting with Lung collapse secondary to left mainstem bronchial obstruction from a lung mass in the setting of small cell lung cancer currently on chemo   in the context of PMH including: Small cell lung cancer  with pertinent positives on physical exam including: Supplemental oxygen requirement  and pertinent positives on radiographic and laboratory data including: Imaging findings mentioned above.  Workup and treatment include supplemental oxygen, incentive spirometry, pulmonology  and oncology consultation.  Given the aforementioned, the predictability of an adverse outcome is felt to be significant. I expect that the patient will require at least 2 midnights in the hospital to treat this condition.   The medical decision making on this patient was of high complexity and the patient is at high risk for clinical deterioration, therefore this is a level 3 visit.  Shela Leff MD [facility] Pager [phone]  If 7PM-7AM, please contact night-coverage [email or web address] Password [email or web address]  05/08/2019, 1:58 AM

## 2019-05-07 NOTE — ED Provider Notes (Signed)
[facility] DEPT Provider Note   CSN: [id number] Arrival date & time: 05/07/19  1633    History   Chief Complaint Chief Complaint  Patient presents with   Urinary Tract Infection   Emesis    HPI Rebekah Henderson is a 63 y.o. female.     The history is provided by the patient and medical records. No language interpreter was used.  Urinary Tract Infection  Associated symptoms: vomiting   Emesis   Rebekah Henderson is a 64 y.o. female who presents to the Emergency Department complaining of vomiting. She presents to the emergency department from home for evaluation of vomiting. She began feeling poorly just over a week ago with nausea, vomiting, diarrhea and dysuria. She was admitted to the hospital one week ago for antibiotics for a urinary tract infection. She was discharged home two days ago and was feeling partially improved at that time. She has been getting antibiotic infusions (ertapenem) through her report through home health since the time of hospital discharge. Her last infusion was today. She has experienced numerous episodes of emesis since hospital discharge. She describes the emesis as foaming and white in nature and sometimes appears like ginger ale she just drank. She has six or more episodes daily. Her diarrhea is decreasing. She denies any fevers. She does complain of progressive left lower quadrant abdominal pain as well as 36 hours of sharp in central chest pain. Symptoms are severe, constant, worsening. She currently lives at home alone. Past Medical History:  Diagnosis Date   Antineoplastic chemotherapy induced anemia 12/03/2016   Anxiety    takes Prozac daily   Arthritis    Benign fundic gland polyps of stomach    Colon polyps    COPD (chronic obstructive pulmonary disease) ([place])    Dehydration 03/06/2017   Diverticulitis    Dyspnea    with exertion   Encounter for antineoplastic chemotherapy 12/03/2016   Fibromyalgia    GERD  (gastroesophageal reflux disease)    takes Pantoprazole daily   Hypertension    takes Metoprolol,Triamterene-HCTZ,and Amlodipine daily   Hypothyroidism    takes Synthroid daily   IBS (irritable bowel syndrome)    lung ca dx'd 10/02/2016   skin, lung   PONV (postoperative nausea and vomiting)    pt also states that she had some difficulty breathing after cervical fusion    Patient Active Problem List   Diagnosis Date Noted   Lung collapse 05/07/2019   Acute pyelonephritis 05/03/2019   Pyelonephritis 05/03/2019   Nausea without vomiting 02/24/2019   UTI due to extended-spectrum beta lactamase (ESBL) producing Escherichia coli 01/08/2019   Viral gastroenteritis 01/08/2019   Gastroenteritis 01/06/2019   Goals of care, counseling/discussion 06/30/2018   Encounter for antineoplastic immunotherapy 03/10/2018   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   Acute on chronic respiratory failure with hypoxia ([place]) 03/21/2017   Mild protein-calorie malnutrition ([place]) 03/21/2017   HCAP (healthcare-associated pneumonia) 03/20/2017   Dehydration 03/06/2017   Anemia 01/04/2017   Thrombocytopenia ([place]) 01/04/2017   Influenza A 12/10/2016   SIRS (systemic inflammatory response syndrome) ([place]) 12/07/2016   Hypoxia 12/07/2016   Bronchitis 12/07/2016   Encounter for antineoplastic chemotherapy 12/03/2016   Antineoplastic chemotherapy induced anemia 12/03/2016   Protein-calorie malnutrition, severe 10/30/2016   Vomiting 10/29/2016   Abdominal pain 10/29/2016   Rash and nonspecific skin eruption 10/29/2016   Neutropenic fever ([place]) 10/15/2016   Intractable nausea and vomiting 10/15/2016   Pancytopenia due  to antineoplastic chemotherapy ([place]) 10/15/2016   Nodular rash 10/15/2016   Hypokalemia 10/15/2016   Hypomagnesemia 10/15/2016   Chronic respiratory failure with hypoxia ([place]) 10/15/2016   Small cell lung cancer, left ([place])  09/19/2016   Mediastinal mass 09/10/2016   COPD (chronic obstructive pulmonary disease) ([facility])    Hypertension    Fibromyalgia    IBS (irritable bowel syndrome)    GERD (gastroesophageal reflux disease)    Hypothyroidism    Colon polyps    Anxiety    PONV (postoperative nausea and vomiting)    Diarrhea 05/12/2013    Past Surgical History:  Procedure Laterality Date   BIOPSY N/A 05/25/2013   Procedure: BIOPSIES (Random Colon; Duodenal; Gastric);  Surgeon: Danie Binder, MD;  Location: [facility];  Service: Endoscopy;  Laterality: N/A;   BLADDER SUSPENSION     BREAST ENHANCEMENT SURGERY     BREAST IMPLANT REMOVAL     CERVICAL FUSION  AUG 2013   CHOLECYSTECTOMY  1999   COLONOSCOPY  2007 [place]   POLYPS   COLONOSCOPY WITH PROPOFOL N/A 05/25/2013   Procedure: COLONOSCOPY WITH PROPOFOL(at cecum 0957) total withdrawal time=60mn);  Surgeon: SDanie Binder MD;  Location: [facility];  Service: Endoscopy;  Laterality: N/A;   ESOPHAGOGASTRODUODENOSCOPY (EGD) WITH PROPOFOL N/A 05/25/2013   Procedure: ESOPHAGOGASTRODUODENOSCOPY (EGD) WITH PROPOFOL;  Surgeon: SDanie Binder MD;  Location: [facility];  Service: Endoscopy;  Laterality: N/A;   FOOT SURGERY     IR FLUORO GUIDE PORT INSERTION RIGHT  03/04/2018   IR [place] VASC ACCESS RIGHT  03/04/2018   POLYPECTOMY N/A 05/25/2013   Procedure: POLYPECTOMY (Rectal and Gastric);  Surgeon: SDanie Binder MD;  Location: [facility];  Service: Endoscopy;  Laterality: N/A;   TONSILLECTOMY     UPPER GASTROINTESTINAL ENDOSCOPY     VIDEO BRONCHOSCOPY WITH ENDOBRONCHIAL ULTRASOUND  09/12/2016   Procedure: VIDEO BRONCHOSCOPY WITH ENDOBRONCHIAL ULTRASOUND AND BIOPSY;  Surgeon: DJuanito Doom MD;  Location: [facility];  Service: Cardiopulmonary;;     OB History    Gravida  1   Para  1   Term  1   Preterm      AB      Living        SAB      TAB      Ectopic      Multiple      Live Births               Home Medications     Prior to Admission medications   Medication Sig Start Date End Date Taking? Authorizing Provider  albuterol (PROVENTIL HFA;VENTOLIN HFA) 108 (90 Base) MCG/ACT inhaler Inhale 2 puffs into the lungs every 4 (four) hours as needed for wheezing or shortness of breath.   Yes [provider]  ALPRAZolam (XANAX) 0.25 MG tablet Take 2 tablets (0.5 mg total) by mouth at bedtime as needed for anxiety. Patient taking differently: Take 0.25-0.5 mg by mouth daily as needed for anxiety or sleep.  02/11/18  Yes MCurt Bears MD  amLODipine (NORVASC) 5 MG tablet Take 5 mg by mouth daily.     Yes [provider]  cyclobenzaprine (FLEXERIL) 5 MG tablet TAKE ONE TABLET 3 TIMES A DAY AS NEEDED. Patient taking differently: Take 5 mg by mouth 3 (three) times daily as needed for muscle spasms.  03/31/19  Yes Tanner, VLyndon Code, PA-C  dicyclomine (BENTYL) 20 MG tablet Take 20 mg by mouth 3 (three) times daily as needed for  spasms.   Yes [provider]  diphenoxylate-atropine (LOMOTIL) 2.5-0.025 MG tablet TAKE 1 TABLET BY MOUTH 4 TIMES DAILY AS NEEDED FOR LOOSE STOOLS. Patient taking differently: Take 1 tablet by mouth 4 (four) times daily as needed for diarrhea or loose stools.  04/20/19  Yes Heilingoetter, Cassandra L, PA-C  ertapenem (INVANZ) IVPB Inject 1 g into the vein daily for 5 days. Indication:  ESBL UTI Last Day of Therapy:  05/10/2019 Labs - Once weekly:  CBC/D and BMP, Labs - Every other week:  ESR and CRP 05/06/19 05/11/19 Yes Shah, Pratik D, DO  estazolam (PROSOM) 2 MG tablet Take 2 mg by mouth at bedtime.   Yes [provider]  estradiol (ESTRACE) 2 MG tablet Take 2 mg by mouth daily.     Yes [provider]  FLUoxetine (PROZAC) 40 MG capsule Take 40 mg by mouth daily.    Yes [provider]  HYDROcodone-acetaminophen (NORCO/VICODIN) 5-325 MG tablet Take 1 tablet by mouth every 4 (four) hours as needed for moderate pain.    Yes [provider]   levothyroxine (SYNTHROID, LEVOTHROID) 50 MCG tablet Take 50 mcg by mouth daily before breakfast.    Yes [provider]  lidocaine-prilocaine (EMLA) cream Apply 1 application topically as needed. To numb skin over port a cath: Squeeze a  small amount on cotton ball and place over port site 1-2 hours prior to chemotherapy. 02/18/18  Yes Curt Bears, MD  loperamide (IMODIUM) 2 MG capsule Take 2 mg by mouth every 2 (two) hours as needed for diarrhea or loose stools.   Yes [provider]  magnesium oxide (MAG-OX) 400 (241.3 Mg) MG tablet Take 1 tablet (400 mg total) by mouth 2 (two) times daily. 11/12/18  Yes Curcio, Roselie Awkward, NP  metoprolol succinate (TOPROL-XL) 25 MG 24 hr tablet Take 25 mg by mouth daily.   Yes [provider]  ondansetron (ZOFRAN-ODT) 4 MG disintegrating tablet Dissolve one tablet by mouth every 8 hours as needed for nausea and vomiting, 02/22/19  Yes Curt Bears, MD  OXYGEN Inhale 2 L into the lungs daily as needed (for shortness of breath).   Yes [provider]  pantoprazole (PROTONIX) 40 MG tablet Take 40 mg by mouth 2 (two) times daily before a meal.    Yes [provider]  phenazopyridine (AZO-TABS) 95 MG tablet Take 190 mg by mouth 2 (two) times a day.   Yes [provider]  potassium chloride SA (K-DUR,KLOR-CON) 20 MEQ tablet Take 20 mEq by mouth daily as needed (for supplement).   Yes [provider]  promethazine (PHENERGAN) 25 MG tablet TAKE ONE TABLET BY MOUTH EVERY 6 HOURS AS NEEDED. Patient taking differently: Take 25 mg by mouth every 6 (six) hours as needed for nausea or vomiting.  12/18/18  Yes Curt Bears, MD  senna-docusate (SENOKOT-S) 8.6-50 MG tablet Take 1 tablet by mouth daily as needed for mild constipation or moderate constipation.  07/17/12  Yes [provider]    Family History Family History  Problem Relation Age of Onset   Breast cancer Mother    Diabetes Maternal  Grandfather    Lung cancer Father    Heart failure Sister 34       Died. Morbidly obese   Colon cancer Neg Hx    Colon polyps Neg Hx     Social History Social History   Tobacco Use   Smoking status: Former Smoker    Packs/day: 2.00  Years: 20.00    Pack years: 40.00    Last attempt to quit: 05/20/2004    Years since quitting: 14.9   Smokeless tobacco: Never Used  Substance Use Topics   Alcohol use: No   Drug use: No     Allergies   Penicillins; Codeine; Fentanyl; Ranitidine hcl; Keflex [cephalexin]; and Lyrica [pregabalin]   Review of Systems Review of Systems  Gastrointestinal: Positive for vomiting.  All other systems reviewed and are negative.    Physical Exam Updated Vital Signs BP (!) 130/91 (BP Location: Left Arm)    Pulse 71    Temp (!) 97.4 F (36.3 C) (Oral)    Resp 20    Ht '5\' 4"'$  (1.626 m)    Wt 68 kg    SpO2 98%    BMI 25.73 kg/m   Physical Exam Vitals signs and nursing note reviewed.  Constitutional:      Appearance: She is well-developed.  HENT:     Head: Normocephalic and atraumatic.  Cardiovascular:     Rate and Rhythm: Normal rate and regular rhythm.  Pulmonary:     Effort: Pulmonary effort is normal. No respiratory distress.     Comments: Decreased air movement in left lung fields.  Abdominal:     Palpations: Abdomen is soft.     Tenderness: There is no guarding or rebound.     Comments: Moderate left lower quadrant tenderness, no guarding or rebound.  Musculoskeletal:        General: No swelling or tenderness.  Skin:    General: Skin is warm and dry.  Neurological:     Mental Status: She is alert and oriented to person, place, and time.  Psychiatric:        Mood and Affect: Mood normal.        Behavior: Behavior normal.      ED Treatments / Results  Labs (all labs ordered are listed, but only abnormal results are displayed) Labs Reviewed  COMPREHENSIVE METABOLIC PANEL - Abnormal; Notable for the following components:       Result Value   Sodium 133 (*)    Glucose, Bld 105 (*)    Calcium 8.6 (*)    All other components within normal limits  CBC WITH DIFFERENTIAL/PLATELET - Abnormal; Notable for the following components:   RBC 2.35 (*)    Hemoglobin 8.7 (*)    HCT 25.7 (*)    MCV 109.4 (*)    MCH 37.0 (*)    RDW 16.4 (*)    Platelets 137 (*)    All other components within normal limits  URINALYSIS, ROUTINE W REFLEX MICROSCOPIC - Abnormal; Notable for the following components:   Ketones, ur 5 (*)    All other components within normal limits  MAGNESIUM - Abnormal; Notable for the following components:   Magnesium 1.4 (*)    All other components within normal limits  SARS CORONAVIRUS 2 (HOSPITAL ORDER, [facility])  URINE CULTURE  MRSA PCR SCREENING  LIPASE, BLOOD  TROPONIN I  TROPONIN I  CBC  BASIC METABOLIC PANEL  PROCALCITONIN  TROPONIN I  TROPONIN I    EKG None  Radiology Ct Chest W Contrast  Result Date: 05/07/2019 CLINICAL DATA:  Lung cancer, UTI, chest pain, abdominal pain, vomiting EXAM: CT CHEST, ABDOMEN, AND PELVIS WITH CONTRAST TECHNIQUE: Multidetector CT imaging of the chest, abdomen and pelvis was performed following the standard protocol during bolus administration of intravenous contrast. CONTRAST:  11m OMNIPAQUE IOHEXOL 300  MG/ML  SOLN COMPARISON:  Same day chest radiograph, CT chest abdomen pelvis, 04/09/2019 FINDINGS: CT CHEST FINDINGS Cardiovascular: No significant vascular findings. Normal heart size. No pericardial effusion. Right chest port catheter. Mediastinum/Nodes: Leftward shift of the mediastinum. Slight interval increase in size of a left hilar and mediastinal mass about the left mainstem bronchus, with increased mass effect on the left mainstem bronchus. Enlarged subcarinal lymph node and involvement of the midportion of the esophagus by mass. Normal thyroid. Small hiatal hernia. Lungs/Pleura: Near total atelectasis of the left lung  secondary to left mainstem bronchial obstruction with a small pleural effusion. Slightly increased ground-glass opacity of the right upper lobe (series 4, image 50). Mild emphysema. Musculoskeletal: No chest wall mass. CT ABDOMEN PELVIS FINDINGS Hepatobiliary: No focal liver abnormality is seen. Status post cholecystectomy with postoperative biliary ductal dilatation. Pancreas: Unremarkable. No pancreatic ductal dilatation or surrounding inflammatory changes. Spleen: Normal in size without significant abnormality. Adrenals/Urinary Tract: Adrenal glands are unremarkable. Kidneys are normal, without renal calculi, solid lesion, or hydronephrosis. Bladder is unremarkable. Stomach/Bowel: Stomach is within normal limits. Appendix appears normal. No evidence of bowel wall thickening, distention, or inflammatory changes. Sigmoid diverticulosis. Vascular/Lymphatic: Calcific atherosclerosis. No enlarged abdominal or pelvic lymph nodes. Reproductive: Status post hysterectomy. Other: No abdominal wall hernia or abnormality. No abdominopelvic ascites. Musculoskeletal: Unchanged sclerotic lesion of the L3 vertebral body. IMPRESSION: 1. Slight interval increase in size of a left hilar and mediastinal mass about the left mainstem bronchus, with increased mass effect on the left mainstem bronchus. Near total atelectasis of the left lung secondary to left mainstem bronchial obstruction with a small pleural effusion. 2. Slightly increased infectious or inflammatory ground-glass opacity of the right upper lobe (series 4, image 50). Mild emphysema. 3.  Unchanged sclerotic lesion of the L3 vertebral body. 4. Other chronic, incidental, and postoperative findings as detailed above. Electronically Signed   By: Eddie Candle M.D.   On: 05/07/2019 19:53   Ct Abdomen Pelvis W Contrast  Result Date: 05/07/2019 CLINICAL DATA:  Lung cancer, UTI, chest pain, abdominal pain, vomiting EXAM: CT CHEST, ABDOMEN, AND PELVIS WITH CONTRAST TECHNIQUE:  Multidetector CT imaging of the chest, abdomen and pelvis was performed following the standard protocol during bolus administration of intravenous contrast. CONTRAST:  181m OMNIPAQUE IOHEXOL 300 MG/ML  SOLN COMPARISON:  Same day chest radiograph, CT chest abdomen pelvis, 04/09/2019 FINDINGS: CT CHEST FINDINGS Cardiovascular: No significant vascular findings. Normal heart size. No pericardial effusion. Right chest port catheter. Mediastinum/Nodes: Leftward shift of the mediastinum. Slight interval increase in size of a left hilar and mediastinal mass about the left mainstem bronchus, with increased mass effect on the left mainstem bronchus. Enlarged subcarinal lymph node and involvement of the midportion of the esophagus by mass. Normal thyroid. Small hiatal hernia. Lungs/Pleura: Near total atelectasis of the left lung secondary to left mainstem bronchial obstruction with a small pleural effusion. Slightly increased ground-glass opacity of the right upper lobe (series 4, image 50). Mild emphysema. Musculoskeletal: No chest wall mass. CT ABDOMEN PELVIS FINDINGS Hepatobiliary: No focal liver abnormality is seen. Status post cholecystectomy with postoperative biliary ductal dilatation. Pancreas: Unremarkable. No pancreatic ductal dilatation or surrounding inflammatory changes. Spleen: Normal in size without significant abnormality. Adrenals/Urinary Tract: Adrenal glands are unremarkable. Kidneys are normal, without renal calculi, solid lesion, or hydronephrosis. Bladder is unremarkable. Stomach/Bowel: Stomach is within normal limits. Appendix appears normal. No evidence of bowel wall thickening, distention, or inflammatory changes. Sigmoid diverticulosis. Vascular/Lymphatic: Calcific atherosclerosis. No enlarged abdominal or pelvic lymph nodes.  Reproductive: Status post hysterectomy. Other: No abdominal wall hernia or abnormality. No abdominopelvic ascites. Musculoskeletal: Unchanged sclerotic lesion of the L3  vertebral body. IMPRESSION: 1. Slight interval increase in size of a left hilar and mediastinal mass about the left mainstem bronchus, with increased mass effect on the left mainstem bronchus. Near total atelectasis of the left lung secondary to left mainstem bronchial obstruction with a small pleural effusion. 2. Slightly increased infectious or inflammatory ground-glass opacity of the right upper lobe (series 4, image 50). Mild emphysema. 3.  Unchanged sclerotic lesion of the L3 vertebral body. 4. Other chronic, incidental, and postoperative findings as detailed above. Electronically Signed   By: Eddie Candle M.D.   On: 05/07/2019 19:53   Dg Chest Port 1 View  Result Date: 05/07/2019 CLINICAL DATA:  UTI, lung cancer, emesis EXAM: PORTABLE CHEST 1 VIEW COMPARISON:  04/03/2019 FINDINGS: There is total opacification of the left hemithorax. The cardiac and mediastinal borders are obscured. Right chest port catheter. IMPRESSION: There is total opacification of the left hemithorax, concerning for large left pleural effusion and/or left mainstem bronchial obstruction by hilar mass. Recommend CT to further evaluate. Electronically Signed   By: Eddie Candle M.D.   On: 05/07/2019 17:31    Procedures Procedures (including critical care time)  Medications Ordered in ED Medications  lactated ringers infusion ( Intravenous New Bag/Given 05/07/19 1739)  sodium chloride (PF) 0.9 % injection (0 mLs  Hold 05/07/19 2051)  enoxaparin (LOVENOX) injection 40 mg (40 mg Subcutaneous Given 05/07/19 2343)  acetaminophen (TYLENOL) tablet 650 mg (has no administration in time range)    Or  acetaminophen (TYLENOL) suppository 650 mg (has no administration in time range)  promethazine (PHENERGAN) injection 12.5 mg (12.5 mg Intravenous Given 05/07/19 2358)  morphine 2 MG/ML injection 1 mg (1 mg Intravenous Given 05/07/19 2242)  HYDROcodone-acetaminophen (NORCO/VICODIN) 5-325 MG per tablet 1-2 tablet (1 tablet Oral Given 05/07/19 2342)    ertapenem (INVANZ) 1,000 mg in sodium chloride 0.9 % 100 mL IVPB (has no administration in time range)  dicyclomine (BENTYL) tablet 20 mg (has no administration in time range)  loperamide (IMODIUM) capsule 2 mg (has no administration in time range)  alum & mag hydroxide-simeth (MAALOX/MYLANTA) 200-200-20 MG/5ML suspension 30 mL (has no administration in time range)    And  lidocaine (XYLOCAINE) 2 % viscous mouth solution 15 mL (has no administration in time range)  pantoprazole (PROTONIX) EC tablet 40 mg (40 mg Oral Given 05/07/19 2342)  Chlorhexidine Gluconate Cloth 2 % PADS 6 each (has no administration in time range)  ondansetron (ZOFRAN) injection 4 mg (4 mg Intravenous Given 05/07/19 1736)  HYDROcodone-acetaminophen (NORCO/VICODIN) 5-325 MG per tablet 1 tablet (1 tablet Oral Given 05/07/19 1736)  iohexol (OMNIPAQUE) 300 MG/ML solution 100 mL (100 mLs Intravenous Contrast Given 05/07/19 1928)  potassium chloride SA (K-DUR) CR tablet 60 mEq (60 mEq Oral Given 05/07/19 2342)     Initial Impression / Assessment and Plan / ED Course  I have reviewed the triage vital signs and the nursing notes.  Pertinent labs & imaging results that were available during my care of the patient were reviewed by me and considered in my medical decision making (see chart for details).        Patient with history of lung cancer and recent UTI due to ESBL here for evaluation of recurrent vomiting, chest pain. She is mildly dehydrated appearing on examination. Chest x-ray demonstrates capacity of the left lung fields, unclear if secondary to atelectasis versus  effusion. Labs demonstrate stable anemia. CT scan demonstrates progression of her lung tumor with atelectasis. Given her dehydration and symptoms medicine consulted for admission. She did receive her antibiotics at home prior to ED arrival.  Final Clinical Impressions(s) / ED Diagnoses   Final diagnoses:  None    ED Discharge Orders    None       Quintella Reichert, MD 05/08/19 0001

## 2019-05-07 NOTE — ED Notes (Signed)
Accessed pt chart for Dr. Marlowe Sax to check status of COVID result.

## 2019-05-07 NOTE — ED Triage Notes (Signed)
Pt states that she has a UTI that has spread to her kidneys. Pt is a lung CA pt. Pt was scheduled for chemo yesterday and today, but wasn't able to attend.  Pt reports 4-5 episodes of emesis today, as well as all day yesterday.

## 2019-05-07 NOTE — Telephone Encounter (Signed)
Received vm message from patient stating that she needs a call back. She was discharged from the hospital 2 days ago with daily IV antibiotics for UTI/pyelonephritis.  TCT patient.  Spoke with her.  She states she is feeling worse than when she came home.  She states that she is so weak, she cannot get up out of bed easily, not voiding much at all.  She is asking what she should do. Home ehalth nurse is coming around 3 pm.  Advised that if she is feeling worse, that she should go to ED for evaluation.  Denies fever, nausea, vomiting. She does sound very weak on the phone. She is agreeable to this but will wait for Home Health nurse to come and have her help with arranging to go to ED @ [facility].  No one is with pt at the this time. She is expecting a friend to come soon.  Asked pt to keep [place] informed about her situation.

## 2019-05-08 DIAGNOSIS — E876 Hypokalemia: Secondary | ICD-10-CM

## 2019-05-08 DIAGNOSIS — C771 Secondary and unspecified malignant neoplasm of intrathoracic lymph nodes: Secondary | ICD-10-CM

## 2019-05-08 DIAGNOSIS — R110 Nausea: Secondary | ICD-10-CM

## 2019-05-08 DIAGNOSIS — R109 Unspecified abdominal pain: Secondary | ICD-10-CM

## 2019-05-08 DIAGNOSIS — C3492 Malignant neoplasm of unspecified part of left bronchus or lung: Secondary | ICD-10-CM

## 2019-05-08 DIAGNOSIS — Z923 Personal history of irradiation: Secondary | ICD-10-CM

## 2019-05-08 DIAGNOSIS — Z1612 Extended spectrum beta lactamase (ESBL) resistance: Secondary | ICD-10-CM

## 2019-05-08 DIAGNOSIS — J9819 Other pulmonary collapse: Secondary | ICD-10-CM

## 2019-05-08 DIAGNOSIS — Z9221 Personal history of antineoplastic chemotherapy: Secondary | ICD-10-CM

## 2019-05-08 DIAGNOSIS — B9620 Unspecified Escherichia coli [E. coli] as the cause of diseases classified elsewhere: Secondary | ICD-10-CM

## 2019-05-08 DIAGNOSIS — N12 Tubulo-interstitial nephritis, not specified as acute or chronic: Secondary | ICD-10-CM

## 2019-05-08 DIAGNOSIS — K529 Noninfective gastroenteritis and colitis, unspecified: Secondary | ICD-10-CM

## 2019-05-08 DIAGNOSIS — G8929 Other chronic pain: Secondary | ICD-10-CM

## 2019-05-08 LAB — TROPONIN I
Troponin I: <0.03 ng/mL (ref <0.03)
Troponin I: <0.03 ng/mL (ref <0.03)

## 2019-05-08 LAB — BASIC METABOLIC PANEL
Anion gap: 9 (ref 5–15)
BUN: 10 mg/dL (ref 8–23)
CO2: 24 mmol/L (ref 22–32)
Calcium: 8.5 mg/dL — ABNORMAL LOW (ref 8.9–10.3)
Chloride: 102 mmol/L (ref 98–111)
Creatinine, Ser: 0.92 mg/dL (ref 0.44–1.00)
GFR calc Af Amer: >60 mL/min (ref >60)
GFR calc non Af Amer: >60 mL/min (ref >60)
Glucose, Bld: 85 mg/dL (ref 70–99)
Potassium: 3.7 mmol/L (ref 3.5–5.1)
Sodium: 135 mmol/L (ref 135–145)

## 2019-05-08 LAB — CULTURE, BLOOD (ROUTINE X 2)
Culture: NO GROWTH
Culture: NO GROWTH
Special Requests: ADEQUATE
Special Requests: ADEQUATE

## 2019-05-08 LAB — MRSA PCR SCREENING: MRSA by PCR: NEGATIVE

## 2019-05-08 LAB — PROCALCITONIN: Procalcitonin: 0.1 ng/mL

## 2019-05-08 MED ORDER — HALOPERIDOL LACTATE 5 MG/ML IJ SOLN: 2.0000 mg | Freq: Four times a day (QID) | INTRAMUSCULAR | Status: DC | PRN

## 2019-05-08 MED ORDER — HALOPERIDOL LACTATE 5 MG/ML IJ SOLN
5.0000 mg | Freq: Once | INTRAMUSCULAR | Status: AC
  Administered 2019-05-08: 5 mg via INTRAVENOUS
  Filled 2019-05-08: qty 1

## 2019-05-08 MED ORDER — METOPROLOL SUCCINATE ER 25 MG PO TB24
25.0000 mg | ORAL_TABLET | Freq: Every day | ORAL | Status: DC
  Administered 2019-05-08 – 2019-05-17 (×10): 25 mg via ORAL
  Filled 2019-05-08 (×10): qty 1

## 2019-05-08 MED ORDER — PHENAZOPYRIDINE HCL 200 MG PO TABS
190.0000 mg | ORAL_TABLET | Freq: Two times a day (BID) | ORAL | Status: DC
  Filled 2019-05-08: qty 1

## 2019-05-08 MED ORDER — FLUOXETINE HCL 20 MG PO CAPS
40.0000 mg | ORAL_CAPSULE | Freq: Every day | ORAL | Status: DC
  Administered 2019-05-08 – 2019-05-17 (×10): 40 mg via ORAL
  Filled 2019-05-08 (×10): qty 2

## 2019-05-08 MED ORDER — PHENAZOPYRIDINE HCL 200 MG PO TABS
200.0000 mg | ORAL_TABLET | Freq: Two times a day (BID) | ORAL | Status: DC
  Administered 2019-05-08 – 2019-05-10 (×5): 200 mg via ORAL
  Filled 2019-05-08 (×6): qty 1

## 2019-05-08 MED ORDER — LEVOTHYROXINE SODIUM 50 MCG PO TABS
50.0000 ug | ORAL_TABLET | Freq: Every day | ORAL | Status: DC
  Administered 2019-05-08 – 2019-05-17 (×8): 50 ug via ORAL
  Filled 2019-05-08 (×9): qty 1

## 2019-05-08 MED ORDER — IPRATROPIUM-ALBUTEROL 0.5-2.5 (3) MG/3ML IN SOLN: 3.0000 mL | Freq: Four times a day (QID) | RESPIRATORY_TRACT | Status: DC | PRN

## 2019-05-08 MED ORDER — HALOPERIDOL LACTATE 5 MG/ML IJ SOLN
2.0000 mg | Freq: Four times a day (QID) | INTRAMUSCULAR | Status: DC | PRN
  Administered 2019-05-08: 21:00:00 2 mg via INTRAVENOUS
  Filled 2019-05-08: qty 1

## 2019-05-08 MED ORDER — MAGNESIUM OXIDE 400 (241.3 MG) MG PO TABS
400.0000 mg | ORAL_TABLET | Freq: Two times a day (BID) | ORAL | Status: DC
  Administered 2019-05-08 – 2019-05-17 (×15): 400 mg via ORAL
  Filled 2019-05-08 (×18): qty 1

## 2019-05-08 MED ORDER — HALOPERIDOL LACTATE 5 MG/ML IJ SOLN
INTRAMUSCULAR | Status: AC
  Filled 2019-05-08: qty 1

## 2019-05-08 MED ORDER — AMLODIPINE BESYLATE 5 MG PO TABS
5.0000 mg | ORAL_TABLET | Freq: Every day | ORAL | Status: DC
  Administered 2019-05-08 – 2019-05-17 (×10): 5 mg via ORAL
  Filled 2019-05-08 (×10): qty 1

## 2019-05-08 MED ORDER — ESTRADIOL 1 MG PO TABS
2.0000 mg | ORAL_TABLET | Freq: Every day | ORAL | Status: DC
  Administered 2019-05-08 – 2019-05-17 (×9): 2 mg via ORAL
  Filled 2019-05-08 (×10): qty 2

## 2019-05-08 MED ORDER — ALPRAZOLAM 0.25 MG PO TABS
0.2500 mg | ORAL_TABLET | Freq: Every day | ORAL | Status: DC | PRN
  Administered 2019-05-08 – 2019-05-11 (×3): 0.5 mg via ORAL
  Filled 2019-05-08 (×3): qty 2

## 2019-05-08 MED ORDER — HALOPERIDOL LACTATE 5 MG/ML IJ SOLN: 5.0000 mg | Freq: Four times a day (QID) | INTRAMUSCULAR | Status: DC | PRN

## 2019-05-08 NOTE — Progress Notes (Signed)
This note also relates to the following rows which could not be included: BP - Cannot attach notes to unvalidated device data MAP (mmHg) - Cannot attach notes to unvalidated device data Pulse Rate - Cannot attach notes to unvalidated device data ECG Heart Rate - Cannot attach notes to unvalidated device data Resp - Cannot attach notes to unvalidated device data SpO2 - Cannot attach notes to unvalidated device data    05/08/19 2300  What Happened  Was fall witnessed? 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 No  Follow Up  MD notified yes Kennon Holter NP and CCM camera)  Time MD notified 2210 (camera in at 2215 and 2300 text to NP [facility])  Family notified Yes-comment  Time family notified 2310  Additional tests No  Simple treatment Other (comment) (assessed)  Progress note created (see row info) Yes  Adult Fall Risk Assessment  Risk Factor Category (scoring not indicated) High fall risk per protocol (document High fall risk);Fall has occurred during this admission (document High fall risk)  Age 63  Fall History: Fall within 6 months prior to admission 0  Elimination; Bowel and/or Urine Incontinence 0  Elimination; Bowel and/or Urine Urgency/Frequency 0  Medications: includes PCA/Opiates, Anti-convulsants, Anti-hypertensives, Diuretics, Hypnotics, Laxatives, Sedatives, and Psychotropics 5  Patient Care Equipment 2  Mobility-Assistance 2  Mobility-Gait 2  Mobility-Sensory Deficit 0  Altered awareness of immediate physical environment 1  Impulsiveness 2  Lack of understanding of one's physical/cognitive limitations 4  Total Score 19  Patient Fall Risk Level High fall risk  Adult Fall Risk Interventions  Required Bundle Interventions *See Row Information* High fall risk - low, moderate, and high requirements implemented  Additional Interventions Room  near nurses station;Use of appropriate toileting equipment (bedpan, BSC, etc.);Reorient/diversional activities with confused patients  Screening for Fall Injury Risk (To be completed on HIGH fall risk patients) - Assessing Need for Low Bed  Risk For Fall Injury- Low Bed Criteria None identified - Continue screening  Screening for Fall Injury Risk (To be completed on HIGH fall risk patients who do not meet crieteria for Low Bed) - Assessing Need for Floor Mats Only  Risk For Fall Injury- Criteria for Floor Mats Noncompliant with safety precautions;Confusion/dementia (+NuDESC, CIWA, TBI, etc.)  Will Implement Floor Mats Yes

## 2019-05-08 NOTE — Progress Notes (Signed)
DIAGNOSIS: Metastatic small cell lung cancer initially diagnosed as Limited stage (T1b, N2, M0) small cell lung cancer presented with left lower lobe/infrahilar mass and large mediastinal lymphadenopathy diagnosed in October 2017.  PRIOR THERAPY:  1) Systemic chemotherapy with cisplatin 60 MG/M2 on day 1 and etoposide 120 MG/M2 on days 1, 2 and 3 status post 1 cycle. This was discontinued secondary to intolerance. 2) Systemic chemotherapy with carboplatin for AUC of 4 on day 1 and etoposide 100 MG/M2 on days 1, 2 and 3 with Neulasta support on day 4 every 3 weeks. Status post 5 cycles. This was concurrent with radiation in [place], [place]. 3) prophylactic cranial irradiation. 4) Systemic chemotherapy with carboplatin for AUC of 5 on day 1, etoposide 100 mg/M2 on days 1, 2 and 3 as well as a Tecentriq (Atezolizumab) 1200 mg IV every 3 weeks with Neulasta support. First dose February 17, 2018. Status post 6 cycles of partial response. 5) Maintenance treatment with immunotherapy with Tecentriq 1200 mg IV every 3 weeks. First dose June 30, 2018. Status post 6 cycles. Last dose was given October 13, 2018 discontinued secondary to disease progression  CURRENT THERAPY: Systemic chemotherapy with cisplatin 30 mg/M2 and irinotecan 65 mg/M2 on days 1 and 8 every 3 weeks. First dose November 10, 2018. Starting cycle #2 her dose of cisplatin was reduced to 25 mg/M2 and irinotecan 50 mg/M2 on days 1 and 8 every 3 weeks. Status post 6 cycles.   Subjective: The patient is seen and examined today.  She has some moments of confusion.  She was recently discharged from [facility] after evaluation for nausea, vomiting and diarrhea as well as urinary tract infection/pyelonephritis.  Urine culture confirmed E. coli with extended spectrum beta lactamase producer.  The patient was treated with ertapenem and was discharged home with expectation for continuous infusion until 05/10/2019.  She did not feel well  after discharge home and she presented to the emergency department at [facility] yesterday complaining of similar symptoms.  CT scan of the chest, abdomen and pelvis performed yesterday showed slight interval increase in the size of the left hilar and mediastinal mass about the left mainstem bronchus with increased mass-effect on the left mainstem bronchus and near total atelectasis of the left lung secondary to left mainstem bronchial obstruction with a small pleural effusion.  I was consulted for evaluation and recommendation regarding this finding.  When seen today the patient mentions that her nausea and diarrhea improved.  She has no fever.  She has moments of confusion.  Objective: Vital signs in last 24 hours: Temp:  [97.4 F (36.3 C)-97.8 F (36.6 C)] 97.8 F (36.6 C) (06/06 0803) Pulse Rate:  [69-94] 86 (06/06 1003) Resp:  [14-30] 14 (06/06 0900) BP: (105-160)/(59-143) 142/70 (06/06 1003) SpO2:  [55 %-100 %] 90 % (06/06 1000) Weight:  [149 lb 14.6 oz (68 kg)] 149 lb 14.6 oz (68 kg) (06/05 1643)  Intake/Output from previous day: 06/05 0701 - 06/06 0700 In: 1184.5 [I.V.:1184.5] Out: -  Intake/Output this shift: No intake/output data recorded.  General appearance: alert, cooperative, fatigued and no distress Resp: diminished breath sounds LLL and LUL and dullness to percussion LLL and LUL Cardio: regular rate and rhythm, S1, S2 normal, no murmur, click, rub or gallop GI: soft, non-tender; bowel sounds normal; no masses,  no organomegaly Extremities: extremities normal, atraumatic, no cyanosis or edema  Lab Results:  Recent Labs    05/07/19 1706  WBC 4.0  HGB 8.7*  HCT 25.7*  PLT 137*   BMET Recent Labs    05/07/19 1706 05/08/19 0104  NA 133* 135  K 3.5 3.7  CL 99 102  CO2 23 24  GLUCOSE 105* 85  BUN 13 10  CREATININE 0.92 0.92  CALCIUM 8.6* 8.5*    Studies/Results: Ct Chest W Contrast  Result Date: 05/07/2019 CLINICAL DATA:  Lung cancer, UTI, chest  pain, abdominal pain, vomiting EXAM: CT CHEST, ABDOMEN, AND PELVIS WITH CONTRAST TECHNIQUE: Multidetector CT imaging of the chest, abdomen and pelvis was performed following the standard protocol during bolus administration of intravenous contrast. CONTRAST:  186mL OMNIPAQUE IOHEXOL 300 MG/ML  SOLN COMPARISON:  Same day chest radiograph, CT chest abdomen pelvis, 04/09/2019 FINDINGS: CT CHEST FINDINGS Cardiovascular: No significant vascular findings. Normal heart size. No pericardial effusion. Right chest port catheter. Mediastinum/Nodes: Leftward shift of the mediastinum. Slight interval increase in size of a left hilar and mediastinal mass about the left mainstem bronchus, with increased mass effect on the left mainstem bronchus. Enlarged subcarinal lymph node and involvement of the midportion of the esophagus by mass. Normal thyroid. Small hiatal hernia. Lungs/Pleura: Near total atelectasis of the left lung secondary to left mainstem bronchial obstruction with a small pleural effusion. Slightly increased ground-glass opacity of the right upper lobe (series 4, image 50). Mild emphysema. Musculoskeletal: No chest wall mass. CT ABDOMEN PELVIS FINDINGS Hepatobiliary: No focal liver abnormality is seen. Status post cholecystectomy with postoperative biliary ductal dilatation. Pancreas: Unremarkable. No pancreatic ductal dilatation or surrounding inflammatory changes. Spleen: Normal in size without significant abnormality. Adrenals/Urinary Tract: Adrenal glands are unremarkable. Kidneys are normal, without renal calculi, solid lesion, or hydronephrosis. Bladder is unremarkable. Stomach/Bowel: Stomach is within normal limits. Appendix appears normal. No evidence of bowel wall thickening, distention, or inflammatory changes. Sigmoid diverticulosis. Vascular/Lymphatic: Calcific atherosclerosis. No enlarged abdominal or pelvic lymph nodes. Reproductive: Status post hysterectomy. Other: No abdominal wall hernia or  abnormality. No abdominopelvic ascites. Musculoskeletal: Unchanged sclerotic lesion of the L3 vertebral body. IMPRESSION: 1. Slight interval increase in size of a left hilar and mediastinal mass about the left mainstem bronchus, with increased mass effect on the left mainstem bronchus. Near total atelectasis of the left lung secondary to left mainstem bronchial obstruction with a small pleural effusion. 2. Slightly increased infectious or inflammatory ground-glass opacity of the right upper lobe (series 4, image 50). Mild emphysema. 3.  Unchanged sclerotic lesion of the L3 vertebral body. 4. Other chronic, incidental, and postoperative findings as detailed above. Electronically Signed   By: Eddie Candle M.D.   On: 05/07/2019 19:53   Ct Abdomen Pelvis W Contrast  Result Date: 05/07/2019 CLINICAL DATA:  Lung cancer, UTI, chest pain, abdominal pain, vomiting EXAM: CT CHEST, ABDOMEN, AND PELVIS WITH CONTRAST TECHNIQUE: Multidetector CT imaging of the chest, abdomen and pelvis was performed following the standard protocol during bolus administration of intravenous contrast. CONTRAST:  170mL OMNIPAQUE IOHEXOL 300 MG/ML  SOLN COMPARISON:  Same day chest radiograph, CT chest abdomen pelvis, 04/09/2019 FINDINGS: CT CHEST FINDINGS Cardiovascular: No significant vascular findings. Normal heart size. No pericardial effusion. Right chest port catheter. Mediastinum/Nodes: Leftward shift of the mediastinum. Slight interval increase in size of a left hilar and mediastinal mass about the left mainstem bronchus, with increased mass effect on the left mainstem bronchus. Enlarged subcarinal lymph node and involvement of the midportion of the esophagus by mass. Normal thyroid. Small hiatal hernia. Lungs/Pleura: Near total atelectasis of the left lung secondary to left mainstem bronchial obstruction with a small  pleural effusion. Slightly increased ground-glass opacity of the right upper lobe (series 4, image 50). Mild emphysema.  Musculoskeletal: No chest wall mass. CT ABDOMEN PELVIS FINDINGS Hepatobiliary: No focal liver abnormality is seen. Status post cholecystectomy with postoperative biliary ductal dilatation. Pancreas: Unremarkable. No pancreatic ductal dilatation or surrounding inflammatory changes. Spleen: Normal in size without significant abnormality. Adrenals/Urinary Tract: Adrenal glands are unremarkable. Kidneys are normal, without renal calculi, solid lesion, or hydronephrosis. Bladder is unremarkable. Stomach/Bowel: Stomach is within normal limits. Appendix appears normal. No evidence of bowel wall thickening, distention, or inflammatory changes. Sigmoid diverticulosis. Vascular/Lymphatic: Calcific atherosclerosis. No enlarged abdominal or pelvic lymph nodes. Reproductive: Status post hysterectomy. Other: No abdominal wall hernia or abnormality. No abdominopelvic ascites. Musculoskeletal: Unchanged sclerotic lesion of the L3 vertebral body. IMPRESSION: 1. Slight interval increase in size of a left hilar and mediastinal mass about the left mainstem bronchus, with increased mass effect on the left mainstem bronchus. Near total atelectasis of the left lung secondary to left mainstem bronchial obstruction with a small pleural effusion. 2. Slightly increased infectious or inflammatory ground-glass opacity of the right upper lobe (series 4, image 50). Mild emphysema. 3.  Unchanged sclerotic lesion of the L3 vertebral body. 4. Other chronic, incidental, and postoperative findings as detailed above. Electronically Signed   By: Eddie Candle M.D.   On: 05/07/2019 19:53   Dg Chest Port 1 View  Result Date: 05/07/2019 CLINICAL DATA:  UTI, lung cancer, emesis EXAM: PORTABLE CHEST 1 VIEW COMPARISON:  04/03/2019 FINDINGS: There is total opacification of the left hemithorax. The cardiac and mediastinal borders are obscured. Right chest port catheter. IMPRESSION: There is total opacification of the left hemithorax, concerning for large left  pleural effusion and/or left mainstem bronchial obstruction by hilar mass. Recommend CT to further evaluate. Electronically Signed   By: Eddie Candle M.D.   On: 05/07/2019 17:31    Medications: I have reviewed the patient's current medications.  Assessment/Plan: This is a very pleasant 63 years old white female with metastatic small cell lung cancer diagnosed in October 2017 status post several chemotherapy regimens as well as immunotherapy with Tecentriq. The patient is currently undergoing systemic chemotherapy with reduced dose cisplatin and irinotecan and has been tolerating this treatment well except for electrolyte imbalance with hypomagnesemia and hypokalemia in addition to generalized weakness and fatigue. She presented recently with urinary tract infection/pyelonephritis.  She is currently undergoing treatment with ertapenem 1 g IV every 24 hours. Recent CT scan of the chest showed almost complete collapse of the left lung secondary to suspicious extrinsic compression of the left mainstem bronchus. I had a lengthy discussion with the patient today and also discussed my recommendation with Dr. Halford Chessman.  The patient may benefit from repeat bronchoscopy to rule out any endobronchial lesion. Her last radiotherapy was more than 2 years ago in [facility].  I will consult with the radiation oncologist to see if the patient would benefit from additional palliative radiotherapy to the left hilar and mediastinal lymphadenopathy to relieve her airway obstruction. I will continue to hold her systemic chemotherapy for now until improvement of her condition. Thank you so much for taking good care of Rebekah Henderson.  We will continue to follow-up the patient with you and assist in her management on as-needed basis.  Disclaimer: This note was dictated with voice recognition software. Similar sounding words can inadvertently be transcribed and may be missed upon review.   LOS: 1 day    Eilleen Kempf 05/08/2019

## 2019-05-08 NOTE — Progress Notes (Signed)
Patient became agitated and was requesting to leave, stating that Dr. Julien Nordmann had told her she could leave. She also claimed to see a snake in the back corner of room, while pointing at the large O2 tube hanging down behind the bed. Dr. Maylene Roes was paged and we agreed that the patient was too confused to leave AMA. Patient was verbally descalated and she eventually agreed to take 0.5mg  of xanax.

## 2019-05-08 NOTE — Progress Notes (Signed)
  PROGRESS NOTE  Called by RN that patient seems confused, agitated, wants to leave AMA. RRT RN and security at bedside. Evaluated patient. She is sitting in chair, irritated, states that she was told she could go home and see Dr. Julien Nordmann on Monday in office. She does seem a bit confused on my examination. I discussed with her the importance of medical treatment. Per review of consultant notes, it sounds like there are plans of possible bronchoscopy and radiation oncology evaluation.   Spoke with Dr. Julien Nordmann over the phone as well, and he is in agreement with above plan. There was no discussion regarding discharge home. Patient definitely confused on his examination as well.   No plan for discharge.  Haldol prn for agitation.  May need sitter if worsens.   Dessa Phi, DO [facility] [email or web address] 05/08/2019, 2:08 PM

## 2019-05-08 NOTE — Progress Notes (Signed)
PROGRESS NOTE    Rebekah Henderson  WYO:[id number] DOB: [birth year] DOA: 05/07/2019 PCP: Redmond School, MD     Brief Narrative:  Rebekah Henderson is a 63 y.o. 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 Patient was recently admitted on June 1 for ESBL UTI/pyelonephritis and treated with meropenem.  She was converted to ertapenem on June 3 and discharged home for ongoing dosages until June 8.  Patient states she has been receiving chemotherapy for several years and has had intermittent nausea and vomiting for several years as well.  States today she vomited after drinking ginger ale.  She is also been having left lower quadrant abdominal pain for several years.  Also reports having intermittent diarrhea for several years, most recent episode 2 days ago.  States she has been seen at [facility] and another outside hospital and was told her symptoms were due to IBS, acid reflux, and fibromyalgia.  She is also complaining of some pain in her lower sternal and upper abdominal region which she describes as a gurgling sensation.  Also reports having acid reflux.  States for the past few days she has been having sharp left-sided chest pain.  Chest pain is nonexertional.  Reports having chronic shortness of breath, no recent change.  She uses 2 L home oxygen all the time.  In the ED, work up revealed chest x-ray showed total opacification of the left hemithorax, concerning for large left pleural effusion and/or left mainstem bronchial obstruction by hilar mass. Chest CT showed slight interval increase in size of the left hilar and mediastinal mass about the left mainstem bronchus, with increased mass-effect on the left mainstem bronchus.  Near-total atelectasis of the left lung secondary to left mainstem bronchial obstruction with a small pleural effusion.  Also  showing slightly increased infectious or inflammatory groundglass opacity of the right upper lobe.  New events last 24 hours / Subjective: Sitting on the side of the bed this morning, no new complaints.  No worsening chest pain, states that she has substernal chest pain that is sharp in nature that has been intermittent, worse with cough.  She denies any worsening shortness of breath, wears 2 L at home intermittently.  Assessment & Plan:   Principal Problem:   Lung collapse Active Problems:   Small cell lung cancer, left (HCC)   Chronic abdominal pain   Chronic nausea   Chronic diarrhea   Lung collapse secondary to left mainstem bronchial obstruction from a lung mass in the setting of small cell lung cancer currently on chemo -Currently on 2 L supplemental oxygen which is the same as her home requirement.  No signs of respiratory distress. Chest CT showing slight interval increase in size of the left hilar and mediastinal mass about the left mainstem bronchus, with increased mass-effect on the left mainstem bronchus.  Near-total atelectasis of the left lung secondary to left mainstem bronchial obstruction with a small pleural effusion.  Also showing slightly increased infectious or inflammatory groundglass opacity of the right upper lobe.  Infection less likely given no fever or leukocytosis.  Procalcitonin 0.10.  COVID-19 rapid test negative. -Pulmonology and oncology consulted  Chronic nausea, vomiting, abdominal pain, diarrhea -Suspect her symptoms are due to combination of factors such as current treatment with chemotherapy, GERD, and underlying IBS -Supportive care. Bentyl, PPI   Atypical chest pain -Substernal chest pain is sharp  in nature, intermittent, worse with deep breaths -Troponins have been negative x3  Recent ESBL UTI -Diagnosed during previous admission. Continue ertapenem, last day of treatment 6/8. Continue pyridium   QTC prolongation on EKG -Avoid QTC prolonging  drugs if possible  COPD -Stable  Hypertension -Continue home toprol, norvasc  Hypothyroidism -Continue home Synthroid  Depression and anxiety -Continue home Prozac, Xanax   DVT prophylaxis: Lovenox Code Status: Full code Family Communication: None Disposition Plan: Pending further evaluation by oncology, possible bronchoscopy later this week   Consultants:   Oncology  Pulmonology  Procedures:   None  Antimicrobials:  Anti-infectives (From admission, onward)   Start     Dose/Rate Route Frequency Ordered Stop   05/08/19 1000  ertapenem [facility]) IVPB  Status:  Discontinued    Note to Pharmacy:  Indication:  ESBL UTI Last Day of Therapy:  05/10/2019 Labs - Once weekly:  CBC/D and BMP, Labs - Every other week:  ESR and CRP     1 g Intravenous Every 24 hours 05/07/19 2154 05/07/19 2204   05/08/19 1000  ertapenem (INVANZ) 1,000 mg in sodium chloride 0.9 % 100 mL IVPB    Note to Pharmacy:  Indication:ESBL UTI Last Day of Therapy:05/10/2019 Labs - Once weekly:CBC/D and BMP, Labs - Every other week:ESR and CRP   1 g 200 mL/hr over 30 Minutes Intravenous Every 24 hours 05/07/19 2204 05/11/19 0959       Objective: Vitals:   05/08/19 0803 05/08/19 0900 05/08/19 1000 05/08/19 1003  BP:  (!) 140/59  (!) 142/70  Pulse:  70  86  Resp:  14    Temp: 97.8 F (36.6 C)     TempSrc: Oral     SpO2:  95% 90%   Weight:      Height:        Intake/Output Summary (Last 24 hours) at 05/08/2019 1148 Last data filed at 05/08/2019 0600 Gross per 24 hour  Intake 1184.51 ml  Output -  Net 1184.51 ml   Filed Weights   05/07/19 1643  Weight: 68 kg    Examination:  General exam: Appears calm and comfortable  Respiratory system: Diminished breath sounds left lung field Cardiovascular system: S1 & S2 Henderson, RRR. No JVD, murmurs, rubs, gallops or clicks. No pedal edema. Gastrointestinal system: Abdomen is nondistended, soft and nontender. No organomegaly or masses felt.  Normal bowel sounds Henderson. Central nervous system: Alert and oriented. No focal neurological deficits. Extremities: Symmetric 5 x 5 power. Skin: No rashes, lesions or ulcers Psychiatry: Judgement and insight appear normal. Mood & affect appropriate.   Data Reviewed: I have personally reviewed following labs and imaging studies  CBC: Recent Labs  Lab 05/03/19 1855 05/04/19 0517 05/05/19 0508 05/07/19 1706  WBC 3.5* 3.7* 3.0* 4.0  NEUTROABS 1.8  --   --  2.6  HGB 9.0* 9.3* 8.8* 8.7*  HCT 27.4* 28.5* 26.5* 25.7*  MCV 110.9* 109.6* 109.1* 109.4*  PLT 118* 116* 121* 275*   Basic Metabolic Panel: Recent Labs  Lab 05/03/19 1855 05/04/19 0517 05/05/19 0508 05/07/19 1706 05/07/19 2150 05/08/19 0104  NA 139 142 138 133*  --  135  K 3.3* 4.4 3.9 3.5  --  3.7  CL 102 105 103 99  --  102  CO2 27 25 25 23   --  24  GLUCOSE 98 87 119* 105*  --  85  BUN 12 10 9 13   --  10  CREATININE 1.09* 1.04* 0.96 0.92  --  0.92  CALCIUM 8.9 8.9 8.8* 8.6*  --  8.5*  MG  --   --  1.4*  --  1.4*  --    GFR: Estimated Creatinine Clearance: 57.7 mL/min (by C-G formula based on SCr of 0.92 mg/dL). Liver Function Tests: Recent Labs  Lab 05/07/19 1706  AST 15  ALT 9  ALKPHOS 39  BILITOT 0.5  PROT 6.5  ALBUMIN 3.8   Recent Labs  Lab 05/07/19 1706  LIPASE 22   No results for input(s): AMMONIA in the last 168 hours. Coagulation Profile: No results for input(s): INR, PROTIME in the last 168 hours. Cardiac Enzymes: Recent Labs  Lab 05/07/19 1706 05/07/19 2150 05/08/19 0051 05/08/19 0830  TROPONINI <0.03 <0.03 <0.03 <0.03   BNP (last 3 results) No results for input(s): PROBNP in the last 8760 hours. HbA1C: No results for input(s): HGBA1C in the last 72 hours. CBG: No results for input(s): GLUCAP in the last 168 hours. Lipid Profile: No results for input(s): CHOL, HDL, LDLCALC, TRIG, CHOLHDL, LDLDIRECT in the last 72 hours. Thyroid Function Tests: No results for input(s): TSH,  T4TOTAL, FREET4, T3FREE, THYROIDAB in the last 72 hours. Anemia Panel: No results for input(s): VITAMINB12, FOLATE, FERRITIN, TIBC, IRON, RETICCTPCT in the last 72 hours. Sepsis Labs: Recent Labs  Lab 05/07/19 2150  PROCALCITON 0.10    Recent Results (from the past 240 hour(s))  Urine culture     Status: Abnormal   Collection Time: 05/03/19  6:19 PM  Result Value Ref Range Status   Specimen Description   Final    URINE, RANDOM Performed at [facility], [address], [place], [place] [address]    Special Requests   Final    NONE Performed at [facility], [address], [place],  [address]    Culture (A)  Final    >=100,000 COLONIES/mL ESCHERICHIA COLI Confirmed Extended Spectrum Beta-Lactamase Producer (ESBL).  In bloodstream infections from ESBL organisms, carbapenems are preferred over piperacillin/tazobactam. They are shown to have a lower risk of mortality.    Report Status 05/05/2019 FINAL  Final   Organism ID, Bacteria ESCHERICHIA COLI (A)  Final      Susceptibility   Escherichia coli - MIC*    AMPICILLIN >=32 RESISTANT Resistant     CEFAZOLIN >=64 RESISTANT Resistant     CEFTRIAXONE >=64 RESISTANT Resistant     CIPROFLOXACIN >=4 RESISTANT Resistant     GENTAMICIN <=1 SENSITIVE Sensitive     IMIPENEM <=0.25 SENSITIVE Sensitive     NITROFURANTOIN 32 SENSITIVE Sensitive     TRIMETH/SULFA >=320 RESISTANT Resistant     AMPICILLIN/SULBACTAM >=32 RESISTANT Resistant     PIP/TAZO <=4 SENSITIVE Sensitive     Extended ESBL POSITIVE Resistant     * >=100,000 COLONIES/mL ESCHERICHIA COLI  SARS Coronavirus 2 (CEPHEID - Performed in [place] hospital lab), Hosp Order     Status: None   Collection Time: 05/03/19  9:35 PM  Result Value Ref Range Status   SARS Coronavirus 2 NEGATIVE NEGATIVE Final    Comment: (NOTE) If result is NEGATIVE SARS-CoV-2 target nucleic acids are NOT DETECTED. The SARS-CoV-2 RNA is generally detectable in upper and lower  respiratory  specimens during the acute phase of infection. The lowest  concentration of SARS-CoV-2 viral copies this assay can detect is 250  copies / mL. A negative result does not preclude SARS-CoV-2 infection  and should not be used as the sole basis for treatment or other  patient management decisions.  A negative result  may occur with  improper specimen collection / handling, submission of specimen other  than nasopharyngeal swab, presence of viral mutation(s) within the  areas targeted by this assay, and inadequate number of viral copies  (<250 copies / mL). A negative result must be combined with clinical  observations, patient history, and epidemiological information. If result is POSITIVE SARS-CoV-2 target nucleic acids are DETECTED. The SARS-CoV-2 RNA is generally detectable in upper and lower  respiratory specimens dur ing the acute phase of infection.  Positive  results are indicative of active infection with SARS-CoV-2.  Clinical  correlation with patient history and other diagnostic information is  necessary to determine patient infection status.  Positive results do  not rule out bacterial infection or co-infection with other viruses. If result is PRESUMPTIVE POSTIVE SARS-CoV-2 nucleic acids MAY BE PRESENT.   A presumptive positive result was obtained on the submitted specimen  and confirmed on repeat testing.  While 2019 novel coronavirus  (SARS-CoV-2) nucleic acids may be present in the submitted sample  additional confirmatory testing may be necessary for epidemiological  and / or clinical management purposes  to differentiate between  SARS-CoV-2 and other Sarbecovirus currently known to infect humans.  If clinically indicated additional testing with an alternate test  methodology [phone]) is advised. The SARS-CoV-2 RNA is generally  detectable in upper and lower respiratory sp ecimens during the acute  phase of infection. The expected result is Negative. Fact Sheet for  Patients:  StrictlyIdeas.no Fact Sheet for Healthcare Providers: BankingDealers.co.za This test is not yet approved or cleared by the [place] FDA and has been authorized for detection and/or diagnosis of SARS-CoV-2 by FDA under an Emergency Use Authorization (EUA).  This EUA will remain in effect (meaning this test can be used) for the duration of the COVID-19 declaration under Section 564(b)(1) of the Act, 21 U.S.C. section 360bbb-3(b)(1), unless the authorization is terminated or revoked sooner. Performed at [facility], [address] [place], [place] [address]   Culture, blood (routine x 2)     Status: None   Collection Time: 05/03/19 11:05 PM  Result Value Ref Range Status   Specimen Description BLOOD RIGHT ARM  Final   Special Requests   Final    BOTTLES DRAWN AEROBIC AND ANAEROBIC Blood Culture adequate volume   Culture   Final    NO GROWTH 5 DAYS Performed at [facility], [address] [place], [place] [address]    Report Status 05/08/2019 FINAL  Final  Culture, blood (routine x 2)     Status: None   Collection Time: 05/03/19 11:07 PM  Result Value Ref Range Status   Specimen Description BLOOD RIGHT ARM  Final   Special Requests   Final    BOTTLES DRAWN AEROBIC AND ANAEROBIC Blood Culture adequate volume   Culture   Final    NO GROWTH 5 DAYS Performed at [facility], [address], [place], [place] [address]    Report Status 05/08/2019 FINAL  Final  SARS Coronavirus 2 (CEPHEID - Performed in [place] hospital lab), Hosp Order     Status: None   Collection Time: 05/07/19  5:32 PM  Result Value Ref Range Status   SARS Coronavirus 2 NEGATIVE NEGATIVE Final    Comment: (NOTE) If result is NEGATIVE SARS-CoV-2 target nucleic acids are NOT DETECTED. The SARS-CoV-2 RNA is generally detectable in upper and lower  respiratory specimens during the acute phase of infection. The lowest  concentration of SARS-CoV-2 viral  copies this assay  can detect is 250  copies / mL. A negative result does not preclude SARS-CoV-2 infection  and should not be used as the sole basis for treatment or other  patient management decisions.  A negative result may occur with  improper specimen collection / handling, submission of specimen other  than nasopharyngeal swab, presence of viral mutation(s) within the  areas targeted by this assay, and inadequate number of viral copies  (<250 copies / mL). A negative result must be combined with clinical  observations, patient history, and epidemiological information. If result is POSITIVE SARS-CoV-2 target nucleic acids are DETECTED. The SARS-CoV-2 RNA is generally detectable in upper and lower  respiratory specimens dur ing the acute phase of infection.  Positive  results are indicative of active infection with SARS-CoV-2.  Clinical  correlation with patient history and other diagnostic information is  necessary to determine patient infection status.  Positive results do  not rule out bacterial infection or co-infection with other viruses. If result is PRESUMPTIVE POSTIVE SARS-CoV-2 nucleic acids MAY BE PRESENT.   A presumptive positive result was obtained on the submitted specimen  and confirmed on repeat testing.  While 2019 novel coronavirus  (SARS-CoV-2) nucleic acids may be present in the submitted sample  additional confirmatory testing may be necessary for epidemiological  and / or clinical management purposes  to differentiate between  SARS-CoV-2 and other Sarbecovirus currently known to infect humans.  If clinically indicated additional testing with an alternate test  methodology [phone]) is advised. The SARS-CoV-2 RNA is generally  detectable in upper and lower respiratory sp ecimens during the acute  phase of infection. The expected result is Negative. Fact Sheet for Patients:  StrictlyIdeas.no Fact Sheet for Healthcare Providers:  BankingDealers.co.za This test is not yet approved or cleared by the [place] FDA and has been authorized for detection and/or diagnosis of SARS-CoV-2 by FDA under an Emergency Use Authorization (EUA).  This EUA will remain in effect (meaning this test can be used) for the duration of the COVID-19 declaration under Section 564(b)(1) of the Act, 21 U.S.C. section 360bbb-3(b)(1), unless the authorization is terminated or revoked sooner. Performed at [facility], [place] [address] [place], [place] [address]   MRSA PCR Screening     Status: None   Collection Time: 05/07/19 11:17 PM  Result Value Ref Range Status   MRSA by PCR NEGATIVE NEGATIVE Final    Comment:        The GeneXpert MRSA Assay (FDA approved for NASAL specimens only), is one component of a comprehensive MRSA colonization surveillance program. It is not intended to diagnose MRSA infection nor to guide or monitor treatment for MRSA infections. Performed at [facility], [place] [address] [place], [place] [address]        Radiology Studies: Ct Chest W Contrast  Result Date: 05/07/2019 CLINICAL DATA:  Lung cancer, UTI, chest pain, abdominal pain, vomiting EXAM: CT CHEST, ABDOMEN, AND PELVIS WITH CONTRAST TECHNIQUE: Multidetector CT imaging of the chest, abdomen and pelvis was performed following the standard protocol during bolus administration of intravenous contrast. CONTRAST:  157m OMNIPAQUE IOHEXOL 300 MG/ML  SOLN COMPARISON:  Same day chest radiograph, CT chest abdomen pelvis, 04/09/2019 FINDINGS: CT CHEST FINDINGS Cardiovascular: No significant vascular findings. Normal heart size. No pericardial effusion. Right chest port catheter. Mediastinum/Nodes: Leftward shift of the mediastinum. Slight interval increase in size of a left hilar and mediastinal mass about the left mainstem bronchus, with increased mass effect on the left mainstem  bronchus. Enlarged  subcarinal lymph node and involvement of the midportion of the esophagus by mass. Normal thyroid. Small hiatal hernia. Lungs/Pleura: Near total atelectasis of the left lung secondary to left mainstem bronchial obstruction with a small pleural effusion. Slightly increased ground-glass opacity of the right upper lobe (series 4, image 50). Mild emphysema. Musculoskeletal: No chest wall mass. CT ABDOMEN PELVIS FINDINGS Hepatobiliary: No focal liver abnormality is seen. Status post cholecystectomy with postoperative biliary ductal dilatation. Pancreas: Unremarkable. No pancreatic ductal dilatation or surrounding inflammatory changes. Spleen: Normal in size without significant abnormality. Adrenals/Urinary Tract: Adrenal glands are unremarkable. Kidneys are normal, without renal calculi, solid lesion, or hydronephrosis. Bladder is unremarkable. Stomach/Bowel: Stomach is within normal limits. Appendix appears normal. No evidence of bowel wall thickening, distention, or inflammatory changes. Sigmoid diverticulosis. Vascular/Lymphatic: Calcific atherosclerosis. No enlarged abdominal or pelvic lymph nodes. Reproductive: Status post hysterectomy. Other: No abdominal wall hernia or abnormality. No abdominopelvic ascites. Musculoskeletal: Unchanged sclerotic lesion of the L3 vertebral body. IMPRESSION: 1. Slight interval increase in size of a left hilar and mediastinal mass about the left mainstem bronchus, with increased mass effect on the left mainstem bronchus. Near total atelectasis of the left lung secondary to left mainstem bronchial obstruction with a small pleural effusion. 2. Slightly increased infectious or inflammatory ground-glass opacity of the right upper lobe (series 4, image 50). Mild emphysema. 3.  Unchanged sclerotic lesion of the L3 vertebral body. 4. Other chronic, incidental, and postoperative findings as detailed above. Electronically Signed   By: Eddie Candle M.D.   On: 05/07/2019 19:53   Ct Abdomen  Pelvis W Contrast  Result Date: 05/07/2019 CLINICAL DATA:  Lung cancer, UTI, chest pain, abdominal pain, vomiting EXAM: CT CHEST, ABDOMEN, AND PELVIS WITH CONTRAST TECHNIQUE: Multidetector CT imaging of the chest, abdomen and pelvis was performed following the standard protocol during bolus administration of intravenous contrast. CONTRAST:  169m OMNIPAQUE IOHEXOL 300 MG/ML  SOLN COMPARISON:  Same day chest radiograph, CT chest abdomen pelvis, 04/09/2019 FINDINGS: CT CHEST FINDINGS Cardiovascular: No significant vascular findings. Normal heart size. No pericardial effusion. Right chest port catheter. Mediastinum/Nodes: Leftward shift of the mediastinum. Slight interval increase in size of a left hilar and mediastinal mass about the left mainstem bronchus, with increased mass effect on the left mainstem bronchus. Enlarged subcarinal lymph node and involvement of the midportion of the esophagus by mass. Normal thyroid. Small hiatal hernia. Lungs/Pleura: Near total atelectasis of the left lung secondary to left mainstem bronchial obstruction with a small pleural effusion. Slightly increased ground-glass opacity of the right upper lobe (series 4, image 50). Mild emphysema. Musculoskeletal: No chest wall mass. CT ABDOMEN PELVIS FINDINGS Hepatobiliary: No focal liver abnormality is seen. Status post cholecystectomy with postoperative biliary ductal dilatation. Pancreas: Unremarkable. No pancreatic ductal dilatation or surrounding inflammatory changes. Spleen: Normal in size without significant abnormality. Adrenals/Urinary Tract: Adrenal glands are unremarkable. Kidneys are normal, without renal calculi, solid lesion, or hydronephrosis. Bladder is unremarkable. Stomach/Bowel: Stomach is within normal limits. Appendix appears normal. No evidence of bowel wall thickening, distention, or inflammatory changes. Sigmoid diverticulosis. Vascular/Lymphatic: Calcific atherosclerosis. No enlarged abdominal or pelvic lymph nodes.  Reproductive: Status post hysterectomy. Other: No abdominal wall hernia or abnormality. No abdominopelvic ascites. Musculoskeletal: Unchanged sclerotic lesion of the L3 vertebral body. IMPRESSION: 1. Slight interval increase in size of a left hilar and mediastinal mass about the left mainstem bronchus, with increased mass effect on the left mainstem bronchus. Near total atelectasis of the left lung secondary to left mainstem bronchial  obstruction with a small pleural effusion. 2. Slightly increased infectious or inflammatory ground-glass opacity of the right upper lobe (series 4, image 50). Mild emphysema. 3.  Unchanged sclerotic lesion of the L3 vertebral body. 4. Other chronic, incidental, and postoperative findings as detailed above. Electronically Signed   By: Eddie Candle M.D.   On: 05/07/2019 19:53   Dg Chest Port 1 View  Result Date: 05/07/2019 CLINICAL DATA:  UTI, lung cancer, emesis EXAM: PORTABLE CHEST 1 VIEW COMPARISON:  04/03/2019 FINDINGS: There is total opacification of the left hemithorax. The cardiac and mediastinal borders are obscured. Right chest port catheter. IMPRESSION: There is total opacification of the left hemithorax, concerning for large left pleural effusion and/or left mainstem bronchial obstruction by hilar mass. Recommend CT to further evaluate. Electronically Signed   By: Eddie Candle M.D.   On: 05/07/2019 17:31      Scheduled Meds: . amLODipine  5 mg Oral Daily  . Chlorhexidine Gluconate Cloth  6 each Topical Daily  . enoxaparin (LOVENOX) injection  40 mg Subcutaneous QHS  . estradiol  2 mg Oral Daily  . FLUoxetine  40 mg Oral Daily  . levothyroxine  50 mcg Oral Q0600  . magnesium oxide  400 mg Oral BID  . metoprolol succinate  25 mg Oral Daily  . pantoprazole  40 mg Oral Daily  . phenazopyridine  200 mg Oral BID   Continuous Infusions: . ertapenem 1,000 mg (05/08/19 1015)     LOS: 1 day    Time spent: 35 minutes   Dessa Phi, DO [facility]  [email or web address] 05/08/2019, 11:48 AM

## 2019-05-08 NOTE — Consult Note (Signed)
NAME:  Rebekah Henderson, MRN:  [id number], DOB:  1956-05-01, LOS: 1 ADMISSION DATE:  05/07/2019, CONSULTATION DATE:  05/08/2019 REFERRING MD:  Dr. Maylene Henderson, [facility], CHIEF COMPLAINT:  Vomiting  Brief History   63 yo female former smoker presented to ER with vomiting.  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  She had admission 05/03/19 for ESBL UTI with pyelonephritis.  She was found to have opacification of Lt hemithorax on chest imaging, and PCCM consulted to assess.  History of present illness   She was dx with SCLC in October 2017.  Treated previously with cisplatin, carboplatin, etoposide, cranial radiation, tecentriq.  Most recently she has been on cisplatin, irinotecan due to disease progression on other therapies.  She is followed by Dr. Julien Henderson.  She had CT chest in May 2020 (reviewed by me), and showed Lt upper lung volume lose and consolidation that was stable with small left effusion, mild centrilobular emphysema.  CT chest from 05/07/19 showed increased size of Lt hilar and mediastinal mass with mass effect on Lt main bronchus, resulting in volume loss and mediastinal shift to Lt, enlarged subcarinal node, involvement of midportion of esophagus by mass (reviewed by me).  She feels more short of breath compared to baseline, and has discomfort in Lt chest.  Not having much cough.  Not having sputum, fever, or hemoptysis.  Feels like food gets stuck in her mid chest, and has trouble swallowing.  Also still has some nausea.  No vomiting episodes since 05/07/19.  Past Medical History  COPD, Chronic hypoxic respiratory failure on 2 liters oxygen, GERD, HTN, Hypothyroidism, IBS, Fibromyalgia  Significant Hospital Events   6/05 Admit  Consults:    Procedures:    Significant Diagnostic Tests:  CT chest 05/07/19 >> showed increased size of Lt hilar and mediastinal mass with mass effect on Lt main bronchus, resulting in volume loss and mediastinal shift to Lt, enlarged subcarinal node,  involvement of midportion of esophagus by mass  Micro Data:  COVID 6/05 >> negative Urine 6/05 >>   Antimicrobials:  Ertapenem 6/03 >>   Interim history/subjective:    Objective   Blood pressure (!) 138/125, pulse 77, temperature (!) 97.4 F (36.3 C), temperature source Oral, resp. rate (!) 30, height 5' 4"  (1.626 m), weight 68 kg, SpO2 98 %.        Intake/Output Summary (Last 24 hours) at 05/08/2019 0749 Last data filed at 05/08/2019 0600 Gross per 24 hour  Intake 1184.51 ml  Output -  Net 1184.51 ml   Filed Weights   05/07/19 1643  Weight: 68 kg    Examination:  General - alert Eyes - pupils reactive ENT - no sinus tenderness, no stridor Cardiac - regular rate/rhythm, no murmur Chest - decreased BS on Lt Abdomen - soft, non tender, + bowel sounds Extremities - no cyanosis, clubbing, or edema Skin - no rashes Neuro - normal strength, moves extremities, follows commands Lymphatics - no lymphadenopathy Psych - normal mood and behavior   Discussion:  She has extrinsic compression of Lt main bronchus most likely by expanding mass and adenopathy in setting of known history of small cell lung cancer.    Assessment & Plan:   Extrinsic compression of Lt main bronchus with Lt lung atelectasis in setting of Small Cell Lung Cancer. Plan - oxygen to keep SpO2 > 90% - consult oncology; might also need assessment by radiation oncology - can do bronchoscopy later this week if it is felt necessary by  oncology to get additional sampling  Hx of COPD with emphysema. Plan - prn BDs  Nausea with vomiting. Plan - per primary team  ESBL UTI. Plan - complete course of ertapenem by primary team  Best practice:  Diet: heart health diet DVT prophylaxis: Lovenox GI prophylaxis: Protonix Mobility: As tolerated Code Status: Full code Disposition:   Labs   CBC: Recent Labs  Lab 05/03/19 1855 05/04/19 0517 05/05/19 0508 05/07/19 1706  WBC 3.5* 3.7* 3.0* 4.0   NEUTROABS 1.8  --   --  2.6  HGB 9.0* 9.3* 8.8* 8.7*  HCT 27.4* 28.5* 26.5* 25.7*  MCV 110.9* 109.6* 109.1* 109.4*  PLT 118* 116* 121* 137*    Basic Metabolic Panel: Recent Labs  Lab 05/03/19 1855 05/04/19 0517 05/05/19 0508 05/07/19 1706 05/07/19 2150 05/08/19 0104  NA 139 142 138 133*  --  135  K 3.3* 4.4 3.9 3.5  --  3.7  CL 102 105 103 99  --  102  CO2 27 25 25 23   --  24  GLUCOSE 98 87 119* 105*  --  85  BUN 12 10 9 13   --  10  CREATININE 1.09* 1.04* 0.96 0.92  --  0.92  CALCIUM 8.9 8.9 8.8* 8.6*  --  8.5*  MG  --   --  1.4*  --  1.4*  --    GFR: Estimated Creatinine Clearance: 57.7 mL/min (by C-G formula based on SCr of 0.92 mg/dL). Recent Labs  Lab 05/03/19 1855 05/04/19 0517 05/05/19 0508 05/07/19 1706 05/07/19 2150  PROCALCITON  --   --   --   --  0.10  WBC 3.5* 3.7* 3.0* 4.0  --     Liver Function Tests: Recent Labs  Lab 05/07/19 1706  AST 15  ALT 9  ALKPHOS 39  BILITOT 0.5  PROT 6.5  ALBUMIN 3.8   Recent Labs  Lab 05/07/19 1706  LIPASE 22   No results for input(s): AMMONIA in the last 168 hours.  ABG    Component Value Date/Time   HCO3 28.6 (H) 12/08/2007 1835   TCO2 30 12/08/2007 1835     Coagulation Profile: No results for input(s): INR, PROTIME in the last 168 hours.  Cardiac Enzymes: Recent Labs  Lab 05/07/19 1706 05/07/19 2150 05/08/19 0051  TROPONINI <0.03 <0.03 <0.03    HbA1C: No results found for: HGBA1C  CBG: No results for input(s): GLUCAP in the last 168 hours.  Review of Systems:   Reviewed and negative.  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   Surgical History    Past Surgical History:  Procedure Laterality Date  . BIOPSY N/A 05/25/2013   Procedure: BIOPSIES (Random Colon; Duodenal; Gastric);  Surgeon: Rebekah Binder, MD;  Location: [facility];  Service: Endoscopy;  Laterality: N/A;  . BLADDER SUSPENSION    . BREAST ENHANCEMENT SURGERY    . BREAST IMPLANT REMOVAL    . CERVICAL FUSION  AUG 2013  . CHOLECYSTECTOMY  1999  . COLONOSCOPY  2007 [place]   POLYPS  . COLONOSCOPY WITH PROPOFOL N/A 05/25/2013   Procedure: COLONOSCOPY WITH PROPOFOL(at cecum 0957) total withdrawal time=59mn);  Surgeon: SMarga Melnick  Fields, MD;  Location: [facility];  Service: Endoscopy;  Laterality: N/A;  . ESOPHAGOGASTRODUODENOSCOPY (EGD) WITH PROPOFOL N/A 05/25/2013   Procedure: ESOPHAGOGASTRODUODENOSCOPY (EGD) WITH PROPOFOL;  Surgeon: Rebekah Binder, MD;  Location: [facility];  Service: Endoscopy;  Laterality: N/A;  . FOOT SURGERY    . IR FLUORO GUIDE PORT INSERTION RIGHT  03/04/2018  . IR US GUIDE VASC ACCESS RIGHT  03/04/2018  . POLYPECTOMY N/A 05/25/2013   Procedure: POLYPECTOMY (Rectal and Gastric);  Surgeon: Rebekah Binder, MD;  Location: [facility];  Service: Endoscopy;  Laterality: N/A;  . TONSILLECTOMY    . UPPER GASTROINTESTINAL ENDOSCOPY    . VIDEO BRONCHOSCOPY WITH ENDOBRONCHIAL ULTRASOUND  09/12/2016   Procedure: VIDEO BRONCHOSCOPY WITH ENDOBRONCHIAL ULTRASOUND AND BIOPSY;  Surgeon: Juanito Doom, MD;  Location: [facility];  Service: Cardiopulmonary;;     Social History   reports that she quit smoking about 14 years ago. She has a 40.00 pack-year smoking history. She has never used smokeless tobacco. She reports that she does not drink alcohol or use drugs.   Family History   Her family history includes Breast cancer in her mother; [name]; Heart failure (age of onset: 39) in her sister; [name]  There is no history of Colon cancer or Colon polyps.   Allergies Allergies  Allergen Reactions  . Penicillins Hives, Swelling and Other (See Comments)    Reaction:  Face/mouth swelling  Has patient had a PCN reaction causing immediate rash, facial/tongue/throat swelling, SOB or lightheadedness with hypotension: Yes Has patient had a PCN reaction causing severe rash involving mucus membranes or skin necrosis: No Has patient had a PCN reaction that required hospitalization No Has patient had a PCN reaction occurring within the last 10 years: No If all of the above answers are "NO", then may proceed with Cephalosporin use.  . Codeine Nausea Only  . Fentanyl Nausea And Vomiting    restless  . Ranitidine Hcl Other (See Comments) and Nausea Only    Reaction:  Dizziness   . Keflex [Cephalexin] Other (See Comments)    Reaction:  Unknown   . Lyrica [Pregabalin] Other (See Comments)    Reaction:  Somnolence      Home Medications  Prior to Admission medications   Medication Sig Start Date End Date Taking? Authorizing Provider  albuterol (PROVENTIL HFA;VENTOLIN HFA) 108 (90 Base) MCG/ACT inhaler Inhale 2 puffs into the lungs every 4 (four) hours as needed for wheezing or shortness of breath.   Yes [provider]  ALPRAZolam (XANAX) 0.25 MG tablet Take 2 tablets (0.5 mg total) by mouth at bedtime as needed for anxiety. Patient taking differently: Take 0.25-0.5 mg by mouth daily as needed for anxiety or sleep.  02/11/18  Yes Curt Bears, MD  amLODipine (NORVASC) 5 MG tablet Take 5 mg by mouth daily.     Yes [provider]  cyclobenzaprine (FLEXERIL) 5 MG tablet TAKE ONE TABLET 3 TIMES A DAY AS NEEDED. Patient taking differently: Take 5 mg by mouth 3 (three) times daily as needed for muscle spasms.  03/31/19  Yes Tanner, Lyndon Code., PA-C  dicyclomine (BENTYL) 20 MG tablet Take 20 mg by mouth 3 (three) times daily as needed for spasms.   Yes [provider]   diphenoxylate-atropine (LOMOTIL) 2.5-0.025 MG tablet TAKE 1 TABLET BY MOUTH 4 TIMES DAILY AS NEEDED FOR LOOSE STOOLS. Patient taking differently: Take 1 tablet by mouth 4 (four) times daily as needed for diarrhea  or loose stools.  04/20/19  Yes Heilingoetter, Cassandra L, PA-C  ertapenem (INVANZ) IVPB Inject 1 g into the vein daily for 5 days. Indication:  ESBL UTI Last Day of Therapy:  05/10/2019 Labs - Once weekly:  CBC/D and BMP, Labs - Every other week:  ESR and CRP 05/06/19 05/11/19 Yes Shah, Pratik D, DO  estazolam (PROSOM) 2 MG tablet Take 2 mg by mouth at bedtime.   Yes [provider]  estradiol (ESTRACE) 2 MG tablet Take 2 mg by mouth daily.     Yes [provider]  FLUoxetine (PROZAC) 40 MG capsule Take 40 mg by mouth daily.    Yes [provider]  HYDROcodone-acetaminophen (NORCO/VICODIN) 5-325 MG tablet Take 1 tablet by mouth every 4 (four) hours as needed for moderate pain.    Yes [provider]  levothyroxine (SYNTHROID, LEVOTHROID) 50 MCG tablet Take 50 mcg by mouth daily before breakfast.    Yes [provider]  lidocaine-prilocaine (EMLA) cream Apply 1 application topically as needed. To numb skin over port a cath: Squeeze a  small amount on cotton ball and place over port site 1-2 hours prior to chemotherapy. 02/18/18  Yes Curt Bears, MD  loperamide (IMODIUM) 2 MG capsule Take 2 mg by mouth every 2 (two) hours as needed for diarrhea or loose stools.   Yes [provider]  magnesium oxide (MAG-OX) 400 (241.3 Mg) MG tablet Take 1 tablet (400 mg total) by mouth 2 (two) times daily. 11/12/18  Yes Curcio, Roselie Awkward, NP  metoprolol succinate (TOPROL-XL) 25 MG 24 hr tablet Take 25 mg by mouth daily.   Yes [provider]  ondansetron (ZOFRAN-ODT) 4 MG disintegrating tablet Dissolve one tablet by mouth every 8 hours as needed for nausea and vomiting, 02/22/19  Yes Curt Bears, MD  OXYGEN Inhale 2 L into the lungs daily as  needed (for shortness of breath).   Yes [provider]  pantoprazole (PROTONIX) 40 MG tablet Take 40 mg by mouth 2 (two) times daily before a meal.    Yes [provider]  phenazopyridine (AZO-TABS) 95 MG tablet Take 190 mg by mouth 2 (two) times a day.   Yes [provider]  potassium chloride SA (K-DUR,KLOR-CON) 20 MEQ tablet Take 20 mEq by mouth daily as needed (for supplement).   Yes [provider]  promethazine (PHENERGAN) 25 MG tablet TAKE ONE TABLET BY MOUTH EVERY 6 HOURS AS NEEDED. Patient taking differently: Take 25 mg by mouth every 6 (six) hours as needed for nausea or vomiting.  12/18/18  Yes Curt Bears, MD  senna-docusate (SENOKOT-S) 8.6-50 MG tablet Take 1 tablet by mouth daily as needed for mild constipation or moderate constipation.  07/17/12  Yes [provider]     Chesley Mires, MD [facility] Pulmonary/Critical Care 05/08/2019, 8:37 AM

## 2019-05-09 LAB — BASIC METABOLIC PANEL
Anion gap: 9 (ref 5–15)
BUN: 8 mg/dL (ref 8–23)
CO2: 25 mmol/L (ref 22–32)
Calcium: 9.2 mg/dL (ref 8.9–10.3)
Chloride: 103 mmol/L (ref 98–111)
Creatinine, Ser: 0.81 mg/dL (ref 0.44–1.00)
GFR calc Af Amer: >60 mL/min (ref >60)
GFR calc non Af Amer: >60 mL/min (ref >60)
Glucose, Bld: 106 mg/dL — ABNORMAL HIGH (ref 70–99)
Potassium: 3.4 mmol/L — ABNORMAL LOW (ref 3.5–5.1)
Sodium: 137 mmol/L (ref 135–145)

## 2019-05-09 LAB — CBC
HCT: 26.7 % — ABNORMAL LOW (ref 36.0–46.0)
Hemoglobin: 9.2 g/dL — ABNORMAL LOW (ref 12.0–15.0)
MCH: 37.7 pg — ABNORMAL HIGH (ref 26.0–34.0)
MCHC: 34.5 g/dL (ref 30.0–36.0)
MCV: 109.4 fL — ABNORMAL HIGH (ref 80.0–100.0)
Platelets: 147 10*3/uL — ABNORMAL LOW (ref 150–400)
RBC: 2.44 MIL/uL — ABNORMAL LOW (ref 3.87–5.11)
RDW: 15.9 % — ABNORMAL HIGH (ref 11.5–15.5)
WBC: 4.1 10*3/uL (ref 4.0–10.5)
nRBC: 0 % (ref 0.0–0.2)

## 2019-05-09 LAB — URINE CULTURE: Culture: NO GROWTH

## 2019-05-09 LAB — GLUCOSE, CAPILLARY: Glucose-Capillary: 87 mg/dL (ref 70–99)

## 2019-05-09 MED ORDER — HALOPERIDOL LACTATE 5 MG/ML IJ SOLN
2.0000 mg | Freq: Once | INTRAMUSCULAR | Status: AC
  Administered 2019-05-10: 2 mg via INTRAVENOUS
  Filled 2019-05-09 (×2): qty 1

## 2019-05-09 MED ORDER — POTASSIUM CHLORIDE CRYS ER 20 MEQ PO TBCR
40.0000 meq | EXTENDED_RELEASE_TABLET | Freq: Once | ORAL | Status: AC
  Administered 2019-05-09: 40 meq via ORAL
  Filled 2019-05-09: qty 2

## 2019-05-09 NOTE — Plan of Care (Signed)
  Problem: Nutrition: Goal: Adequate nutrition will be maintained Outcome: Progressing   Problem: Elimination: Goal: Will not experience complications related to bowel motility Outcome: Progressing   Problem: Safety: Goal: Ability to remain free from injury will improve Outcome: Progressing   

## 2019-05-09 NOTE — Progress Notes (Signed)
PROGRESS NOTE    Rebekah Henderson  DDU:[id number] DOB: 20-Aug-1956 DOA: 05/07/2019 PCP: Redmond School, MD     Brief Narrative:  Rebekah Henderson is a 63 y.o. 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 Patient was recently admitted on June 1 for ESBL UTI/pyelonephritis and treated with meropenem.  She was converted to ertapenem on June 3 and discharged home for ongoing dosages until June 8.  Patient states she has been receiving chemotherapy for several years and has had intermittent nausea and vomiting for several years as well.  States today she vomited after drinking ginger ale.  She is also been having left lower quadrant abdominal pain for several years.  Also reports having intermittent diarrhea for several years, most recent episode 2 days ago.  States she has been seen at [facility] and another outside hospital and was told her symptoms were due to IBS, acid reflux, and fibromyalgia.  She is also complaining of some pain in her lower sternal and upper abdominal region which she describes as a gurgling sensation.  Also reports having acid reflux.  States for the past few days she has been having sharp left-sided chest pain.  Chest pain is nonexertional.  Reports having chronic shortness of breath, no recent change.  She uses 2 L home oxygen all the time.  In the ED, work up revealed chest x-ray showed total opacification of the left hemithorax, concerning for large left pleural effusion and/or left mainstem bronchial obstruction by hilar mass. Chest CT showed slight interval increase in size of the left hilar and mediastinal mass about the left mainstem bronchus, with increased mass-effect on the left mainstem bronchus.  Near-total atelectasis of the left lung secondary to left mainstem bronchial obstruction with a small pleural effusion.  Also  showing slightly increased infectious or inflammatory groundglass opacity of the right upper lobe.  New events last 24 hours / Subjective: Fell overnight, now in wrist soft restraint. No new complaints on my exam, she remains calm this morning, still slightly confused, but answers questions appropriately for the most part. No worsening SOB.   Assessment & Plan:   Principal Problem:   Lung collapse Active Problems:   Small cell lung cancer, left (HCC)   Chronic abdominal pain   Chronic nausea   Chronic diarrhea   Lung collapse secondary to left mainstem bronchial obstruction from a lung mass in the setting of small cell lung cancer currently on chemo -Currently on 2 L supplemental oxygen which is the same as her home requirement.  No signs of respiratory distress. Chest CT showing slight interval increase in size of the left hilar and mediastinal mass about the left mainstem bronchus, with increased mass-effect on the left mainstem bronchus.  Near-total atelectasis of the left lung secondary to left mainstem bronchial obstruction with a small pleural effusion.  Also showing slightly increased infectious or inflammatory groundglass opacity of the right upper lobe.  Infection less likely given no fever or leukocytosis.  Procalcitonin 0.10.  COVID-19 rapid test negative. -Pulmonology and oncology consulted, planning for bronchoscopy and rad onc referral   Chronic nausea, vomiting, abdominal pain, diarrhea -Suspect her symptoms are due to combination of factors such as current treatment with chemotherapy, GERD, and underlying IBS -Supportive care. Bentyl, PPI   Atypical chest pain -Substernal chest pain is sharp in nature, intermittent, worse with deep breaths -Troponins have  been negative x3  Recent ESBL UTI -Diagnosed during previous admission. Continue ertapenem, last day of treatment 6/8. Continue pyridium. Repeat urine culture on admission negative.   QTC prolongation on EKG -Avoid  QTC prolonging drugs if possible  COPD -Stable  Hypertension -Continue home toprol, norvasc  Hypothyroidism -Continue home Synthroid  Depression and anxiety -Continue home Prozac, Xanax  Hypokalemia -Replaced, trend   Agitation/delirium -Unclear cause, no mention of this behavior during previous hospitalization. Delirium precautions ordered    DVT prophylaxis: Lovenox Code Status: Full code Family Communication: None Disposition Plan: Pending bronchoscopy next week, rad onc referral    Consultants:   Oncology  Pulmonology  Procedures:   None  Antimicrobials:  Anti-infectives (From admission, onward)   Start     Dose/Rate Route Frequency Ordered Stop   05/08/19 1000  ertapenem [facility]) IVPB  Status:  Discontinued    Note to Pharmacy:  Indication:  ESBL UTI Last Day of Therapy:  05/10/2019 Labs - Once weekly:  CBC/D and BMP, Labs - Every other week:  ESR and CRP     1 g Intravenous Every 24 hours 05/07/19 2154 05/07/19 2204   05/08/19 1000  ertapenem (INVANZ) 1,000 mg in sodium chloride 0.9 % 100 mL IVPB    Note to Pharmacy:  Indication:ESBL UTI Last Day of Therapy:05/10/2019 Labs - Once weekly:CBC/D and BMP, Labs - Every other week:ESR and CRP   1 g 200 mL/hr over 30 Minutes Intravenous Every 24 hours 05/07/19 2204 05/11/19 0959       Objective: Vitals:   05/09/19 0200 05/09/19 0202 05/09/19 0310 05/09/19 0800  BP: (!) 154/105 (!) 154/78    Pulse: 85 78    Resp: (!) 27 (!) 21    Temp:   (!) 96.9 F (36.1 C) 97.9 F (36.6 C)  TempSrc:   Axillary Oral  SpO2: 93% (!) 70%    Weight:      Height:       No intake or output data in the 24 hours ending 05/09/19 0924 Filed Weights   05/07/19 1643  Weight: 68 kg    Examination: General exam: Appears calm and comfortable  Respiratory system: Diminished left lung field  Cardiovascular system: S1 & S2 heard, RRR. No JVD, murmurs, rubs, gallops or clicks. No pedal edema. Gastrointestinal  system: Abdomen is nondistended, soft and nontender. No organomegaly or masses felt. Normal bowel sounds heard. Central nervous system: Alert and oriented. No focal neurological deficits. Extremities: Symmetric 5 x 5 power. Skin: No rashes, lesions or ulcers Psychiatry: Judgement and insight appear stable, but slightly confused still    Data Reviewed: I have personally reviewed following labs and imaging studies  CBC: Recent Labs  Lab 05/03/19 1855 05/04/19 0517 05/05/19 0508 05/07/19 1706 05/09/19 0500  WBC 3.5* 3.7* 3.0* 4.0 4.1  NEUTROABS 1.8  --   --  2.6  --   HGB 9.0* 9.3* 8.8* 8.7* 9.2*  HCT 27.4* 28.5* 26.5* 25.7* 26.7*  MCV 110.9* 109.6* 109.1* 109.4* 109.4*  PLT 118* 116* 121* 137* 621*   Basic Metabolic Panel: Recent Labs  Lab 05/04/19 0517 05/05/19 0508 05/07/19 1706 05/07/19 2150 05/08/19 0104 05/09/19 0500  NA 142 138 133*  --  135 137  K 4.4 3.9 3.5  --  3.7 3.4*  CL 105 103 99  --  102 103  CO2 _0 --  24 25  GLUCOSE 87 119* 105*  --  85 106*  BUN _1 --  10 8  CREATININE 1.04* 0.96 0.92  --  0.92 0.81  CALCIUM 8.9 8.8* 8.6*  --  8.5* 9.2  MG  --  1.4*  --  1.4*  --   --    GFR: Estimated Creatinine Clearance: 65.6 mL/min (by C-G formula based on SCr of 0.81 mg/dL). Liver Function Tests: Recent Labs  Lab 05/07/19 1706  AST 15  ALT 9  ALKPHOS 39  BILITOT 0.5  PROT 6.5  ALBUMIN 3.8   Recent Labs  Lab 05/07/19 1706  LIPASE 22   No results for input(s): AMMONIA in the last 168 hours. Coagulation Profile: No results for input(s): INR, PROTIME in the last 168 hours. Cardiac Enzymes: Recent Labs  Lab 05/07/19 1706 05/07/19 2150 05/08/19 0051 05/08/19 0830  TROPONINI <0.03 <0.03 <0.03 <0.03   BNP (last 3 results) No results for input(s): PROBNP in the last 8760 hours. HbA1C: No results for input(s): HGBA1C in the last 72 hours. CBG: No results for input(s): GLUCAP in the last 168 hours. Lipid Profile: No results for  input(s): CHOL, HDL, LDLCALC, TRIG, CHOLHDL, LDLDIRECT in the last 72 hours. Thyroid Function Tests: No results for input(s): TSH, T4TOTAL, FREET4, T3FREE, THYROIDAB in the last 72 hours. Anemia Panel: No results for input(s): VITAMINB12, FOLATE, FERRITIN, TIBC, IRON, RETICCTPCT in the last 72 hours. Sepsis Labs: Recent Labs  Lab 05/07/19 2150  PROCALCITON 0.10    Recent Results (from the past 240 hour(s))  Urine culture     Status: Abnormal   Collection Time: 05/03/19  6:19 PM  Result Value Ref Range Status   Specimen Description   Final    URINE, RANDOM Performed at [facility], [address], [place], [place] [address]    Special Requests   Final    NONE Performed at [facility], [address] [place], [place] [address]    Culture (A)  Final    >=100,000 COLONIES/mL ESCHERICHIA COLI Confirmed Extended Spectrum Beta-Lactamase Producer (ESBL).  In bloodstream infections from ESBL organisms, carbapenems are preferred over piperacillin/tazobactam. They are shown to have a lower risk of mortality.    Report Status 05/05/2019 FINAL  Final   Organism ID, Bacteria ESCHERICHIA COLI (A)  Final      Susceptibility   Escherichia coli - MIC*    AMPICILLIN >=32 RESISTANT Resistant     CEFAZOLIN >=64 RESISTANT Resistant     CEFTRIAXONE >=64 RESISTANT Resistant     CIPROFLOXACIN >=4 RESISTANT Resistant     GENTAMICIN <=1 SENSITIVE Sensitive     IMIPENEM <=0.25 SENSITIVE Sensitive     NITROFURANTOIN 32 SENSITIVE Sensitive     TRIMETH/SULFA >=320 RESISTANT Resistant     AMPICILLIN/SULBACTAM >=32 RESISTANT Resistant     PIP/TAZO <=4 SENSITIVE Sensitive     Extended ESBL POSITIVE Resistant     * >=100,000 COLONIES/mL ESCHERICHIA COLI  SARS Coronavirus 2 (CEPHEID - Performed in [place] hospital lab), Hosp Order     Status: None   Collection Time: 05/03/19  9:35 PM  Result Value Ref Range Status   SARS Coronavirus 2 NEGATIVE NEGATIVE Final    Comment: (NOTE) If result is  NEGATIVE SARS-CoV-2 target nucleic acids are NOT DETECTED. The SARS-CoV-2 RNA is generally detectable in upper and lower  respiratory specimens during the acute phase of infection. The lowest  concentration of SARS-CoV-2 viral copies this assay can detect is 250  copies / mL. A negative result does not preclude SARS-CoV-2 infection  and should not be used as the sole  basis for treatment or other  patient management decisions.  A negative result may occur with  improper specimen collection / handling, submission of specimen other  than nasopharyngeal swab, presence of viral mutation(s) within the  areas targeted by this assay, and inadequate number of viral copies  (<250 copies / mL). A negative result must be combined with clinical  observations, patient history, and epidemiological information. If result is POSITIVE SARS-CoV-2 target nucleic acids are DETECTED. The SARS-CoV-2 RNA is generally detectable in upper and lower  respiratory specimens dur ing the acute phase of infection.  Positive  results are indicative of active infection with SARS-CoV-2.  Clinical  correlation with patient history and other diagnostic information is  necessary to determine patient infection status.  Positive results do  not rule out bacterial infection or co-infection with other viruses. If result is PRESUMPTIVE POSTIVE SARS-CoV-2 nucleic acids MAY BE PRESENT.   A presumptive positive result was obtained on the submitted specimen  and confirmed on repeat testing.  While 2019 novel coronavirus  (SARS-CoV-2) nucleic acids may be present in the submitted sample  additional confirmatory testing may be necessary for epidemiological  and / or clinical management purposes  to differentiate between  SARS-CoV-2 and other Sarbecovirus currently known to infect humans.  If clinically indicated additional testing with an alternate test  methodology [phone]) is advised. The SARS-CoV-2 RNA is generally  detectable  in upper and lower respiratory sp ecimens during the acute  phase of infection. The expected result is Negative. Fact Sheet for Patients:  StrictlyIdeas.no Fact Sheet for Healthcare Providers: BankingDealers.co.za This test is not yet approved or cleared by the [place] FDA and has been authorized for detection and/or diagnosis of SARS-CoV-2 by FDA under an Emergency Use Authorization (EUA).  This EUA will remain in effect (meaning this test can be used) for the duration of the COVID-19 declaration under Section 564(b)(1) of the Act, 21 U.S.C. section 360bbb-3(b)(1), unless the authorization is terminated or revoked sooner. Performed at [facility], [address] [place], [place] [address]   Culture, blood (routine x 2)     Status: None   Collection Time: 05/03/19 11:05 PM  Result Value Ref Range Status   Specimen Description BLOOD RIGHT ARM  Final   Special Requests   Final    BOTTLES DRAWN AEROBIC AND ANAEROBIC Blood Culture adequate volume   Culture   Final    NO GROWTH 5 DAYS Performed at [facility], [address], [place], [place] [address]    Report Status 05/08/2019 FINAL  Final  Culture, blood (routine x 2)     Status: None   Collection Time: 05/03/19 11:07 PM  Result Value Ref Range Status   Specimen Description BLOOD RIGHT ARM  Final   Special Requests   Final    BOTTLES DRAWN AEROBIC AND ANAEROBIC Blood Culture adequate volume   Culture   Final    NO GROWTH 5 DAYS Performed at [facility], [address], [place], [place] [address]    Report Status 05/08/2019 FINAL  Final  SARS Coronavirus 2 (CEPHEID - Performed in [place] hospital lab), Hosp Order     Status: None   Collection Time: 05/07/19  5:32 PM  Result Value Ref Range Status   SARS Coronavirus 2 NEGATIVE NEGATIVE Final    Comment: (NOTE) If result is NEGATIVE SARS-CoV-2 target nucleic acids are NOT DETECTED. The SARS-CoV-2 RNA is generally  detectable in upper and lower  respiratory specimens during the acute  phase of infection. The lowest  concentration of SARS-CoV-2 viral copies this assay can detect is 250  copies / mL. A negative result does not preclude SARS-CoV-2 infection  and should not be used as the sole basis for treatment or other  patient management decisions.  A negative result may occur with  improper specimen collection / handling, submission of specimen other  than nasopharyngeal swab, presence of viral mutation(s) within the  areas targeted by this assay, and inadequate number of viral copies  (<250 copies / mL). A negative result must be combined with clinical  observations, patient history, and epidemiological information. If result is POSITIVE SARS-CoV-2 target nucleic acids are DETECTED. The SARS-CoV-2 RNA is generally detectable in upper and lower  respiratory specimens dur ing the acute phase of infection.  Positive  results are indicative of active infection with SARS-CoV-2.  Clinical  correlation with patient history and other diagnostic information is  necessary to determine patient infection status.  Positive results do  not rule out bacterial infection or co-infection with other viruses. If result is PRESUMPTIVE POSTIVE SARS-CoV-2 nucleic acids MAY BE PRESENT.   A presumptive positive result was obtained on the submitted specimen  and confirmed on repeat testing.  While 2019 novel coronavirus  (SARS-CoV-2) nucleic acids may be present in the submitted sample  additional confirmatory testing may be necessary for epidemiological  and / or clinical management purposes  to differentiate between  SARS-CoV-2 and other Sarbecovirus currently known to infect humans.  If clinically indicated additional testing with an alternate test  methodology [phone]) is advised. The SARS-CoV-2 RNA is generally  detectable in upper and lower respiratory sp ecimens during the acute  phase of infection. The  expected result is Negative. Fact Sheet for Patients:  StrictlyIdeas.no Fact Sheet for Healthcare Providers: BankingDealers.co.za This test is not yet approved or cleared by the [place] FDA and has been authorized for detection and/or diagnosis of SARS-CoV-2 by FDA under an Emergency Use Authorization (EUA).  This EUA will remain in effect (meaning this test can be used) for the duration of the COVID-19 declaration under Section 564(b)(1) of the Act, 21 U.S.C. section 360bbb-3(b)(1), unless the authorization is terminated or revoked sooner. Performed at [facility], [place] [address] [place], [place] [address]   Urine culture     Status: None   Collection Time: 05/07/19  7:59 PM  Result Value Ref Range Status   Specimen Description   Final    URINE, RANDOM Performed at [place] [address], [place], [place] [address]    Special Requests   Final    NONE Performed at [facility], [place] [address], [place], [place] [address]    Culture   Final    NO GROWTH Performed at [facility], [place] [address], [place], [place] [address]    Report Status 05/09/2019 FINAL  Final  MRSA PCR Screening     Status: None   Collection Time: 05/07/19 11:17 PM  Result Value Ref Range Status   MRSA by PCR NEGATIVE NEGATIVE Final    Comment:        The GeneXpert MRSA Assay (FDA approved for NASAL specimens only), is one component of a comprehensive MRSA colonization surveillance program. It is not intended to diagnose MRSA infection nor to guide or monitor treatment for MRSA infections. Performed at [facility], [place] [address], [place],  [address]        Radiology  Studies: Ct Chest W Contrast  Result Date: 05/07/2019 CLINICAL DATA:  Lung cancer, UTI, chest pain, abdominal pain, vomiting EXAM: CT CHEST, ABDOMEN, AND PELVIS WITH CONTRAST TECHNIQUE:  Multidetector CT imaging of the chest, abdomen and pelvis was performed following the standard protocol during bolus administration of intravenous contrast. CONTRAST:  119m OMNIPAQUE IOHEXOL 300 MG/ML  SOLN COMPARISON:  Same day chest radiograph, CT chest abdomen pelvis, 04/09/2019 FINDINGS: CT CHEST FINDINGS Cardiovascular: No significant vascular findings. Normal heart size. No pericardial effusion. Right chest port catheter. Mediastinum/Nodes: Leftward shift of the mediastinum. Slight interval increase in size of a left hilar and mediastinal mass about the left mainstem bronchus, with increased mass effect on the left mainstem bronchus. Enlarged subcarinal lymph node and involvement of the midportion of the esophagus by mass. Normal thyroid. Small hiatal hernia. Lungs/Pleura: Near total atelectasis of the left lung secondary to left mainstem bronchial obstruction with a small pleural effusion. Slightly increased ground-glass opacity of the right upper lobe (series 4, image 50). Mild emphysema. Musculoskeletal: No chest wall mass. CT ABDOMEN PELVIS FINDINGS Hepatobiliary: No focal liver abnormality is seen. Status post cholecystectomy with postoperative biliary ductal dilatation. Pancreas: Unremarkable. No pancreatic ductal dilatation or surrounding inflammatory changes. Spleen: Normal in size without significant abnormality. Adrenals/Urinary Tract: Adrenal glands are unremarkable. Kidneys are normal, without renal calculi, solid lesion, or hydronephrosis. Bladder is unremarkable. Stomach/Bowel: Stomach is within normal limits. Appendix appears normal. No evidence of bowel wall thickening, distention, or inflammatory changes. Sigmoid diverticulosis. Vascular/Lymphatic: Calcific atherosclerosis. No enlarged abdominal or pelvic lymph nodes. Reproductive: Status post hysterectomy. Other: No abdominal wall hernia or abnormality. No abdominopelvic ascites. Musculoskeletal: Unchanged sclerotic lesion of the L3  vertebral body. IMPRESSION: 1. Slight interval increase in size of a left hilar and mediastinal mass about the left mainstem bronchus, with increased mass effect on the left mainstem bronchus. Near total atelectasis of the left lung secondary to left mainstem bronchial obstruction with a small pleural effusion. 2. Slightly increased infectious or inflammatory ground-glass opacity of the right upper lobe (series 4, image 50). Mild emphysema. 3.  Unchanged sclerotic lesion of the L3 vertebral body. 4. Other chronic, incidental, and postoperative findings as detailed above. Electronically Signed   By: AEddie CandleM.D.   On: 05/07/2019 19:53   Ct Abdomen Pelvis W Contrast  Result Date: 05/07/2019 CLINICAL DATA:  Lung cancer, UTI, chest pain, abdominal pain, vomiting EXAM: CT CHEST, ABDOMEN, AND PELVIS WITH CONTRAST TECHNIQUE: Multidetector CT imaging of the chest, abdomen and pelvis was performed following the standard protocol during bolus administration of intravenous contrast. CONTRAST:  1016mOMNIPAQUE IOHEXOL 300 MG/ML  SOLN COMPARISON:  Same day chest radiograph, CT chest abdomen pelvis, 04/09/2019 FINDINGS: CT CHEST FINDINGS Cardiovascular: No significant vascular findings. Normal heart size. No pericardial effusion. Right chest port catheter. Mediastinum/Nodes: Leftward shift of the mediastinum. Slight interval increase in size of a left hilar and mediastinal mass about the left mainstem bronchus, with increased mass effect on the left mainstem bronchus. Enlarged subcarinal lymph node and involvement of the midportion of the esophagus by mass. Normal thyroid. Small hiatal hernia. Lungs/Pleura: Near total atelectasis of the left lung secondary to left mainstem bronchial obstruction with a small pleural effusion. Slightly increased ground-glass opacity of the right upper lobe (series 4, image 50). Mild emphysema. Musculoskeletal: No chest wall mass. CT ABDOMEN PELVIS FINDINGS Hepatobiliary: No focal liver  abnormality is seen. Status post cholecystectomy with postoperative biliary ductal dilatation. Pancreas: Unremarkable. No pancreatic ductal dilatation or surrounding inflammatory changes.  Spleen: Normal in size without significant abnormality. Adrenals/Urinary Tract: Adrenal glands are unremarkable. Kidneys are normal, without renal calculi, solid lesion, or hydronephrosis. Bladder is unremarkable. Stomach/Bowel: Stomach is within normal limits. Appendix appears normal. No evidence of bowel wall thickening, distention, or inflammatory changes. Sigmoid diverticulosis. Vascular/Lymphatic: Calcific atherosclerosis. No enlarged abdominal or pelvic lymph nodes. Reproductive: Status post hysterectomy. Other: No abdominal wall hernia or abnormality. No abdominopelvic ascites. Musculoskeletal: Unchanged sclerotic lesion of the L3 vertebral body. IMPRESSION: 1. Slight interval increase in size of a left hilar and mediastinal mass about the left mainstem bronchus, with increased mass effect on the left mainstem bronchus. Near total atelectasis of the left lung secondary to left mainstem bronchial obstruction with a small pleural effusion. 2. Slightly increased infectious or inflammatory ground-glass opacity of the right upper lobe (series 4, image 50). Mild emphysema. 3.  Unchanged sclerotic lesion of the L3 vertebral body. 4. Other chronic, incidental, and postoperative findings as detailed above. Electronically Signed   By: Eddie Candle M.D.   On: 05/07/2019 19:53   Dg Chest Port 1 View  Result Date: 05/07/2019 CLINICAL DATA:  UTI, lung cancer, emesis EXAM: PORTABLE CHEST 1 VIEW COMPARISON:  04/03/2019 FINDINGS: There is total opacification of the left hemithorax. The cardiac and mediastinal borders are obscured. Right chest port catheter. IMPRESSION: There is total opacification of the left hemithorax, concerning for large left pleural effusion and/or left mainstem bronchial obstruction by hilar mass. Recommend CT to  further evaluate. Electronically Signed   By: Eddie Candle M.D.   On: 05/07/2019 17:31      Scheduled Meds:  amLODipine  5 mg Oral Daily   Chlorhexidine Gluconate Cloth  6 each Topical Daily   enoxaparin (LOVENOX) injection  40 mg Subcutaneous QHS   estradiol  2 mg Oral Daily   FLUoxetine  40 mg Oral Daily   levothyroxine  50 mcg Oral Q0600   magnesium oxide  400 mg Oral BID   metoprolol succinate  25 mg Oral Daily   pantoprazole  40 mg Oral Daily   phenazopyridine  200 mg Oral BID   potassium chloride  40 mEq Oral Once   Continuous Infusions:  ertapenem Stopped (05/08/19 1045)     LOS: 2 days    Time spent: 25 minutes   Dessa Phi, DO [facility] [email or web address] 05/09/2019, 9:24 AM

## 2019-05-09 NOTE — Plan of Care (Signed)
  Problem: Safety: Goal: Ability to remain free from injury will improve Outcome: Progressing Note:  Restraints on bil wrist and posey

## 2019-05-10 ENCOUNTER — Encounter (HOSPITAL_COMMUNITY): Payer: Self-pay | Admitting: Respiratory Therapy

## 2019-05-10 ENCOUNTER — Inpatient Hospital Stay (HOSPITAL_COMMUNITY): Payer: Medicare Other

## 2019-05-10 ENCOUNTER — Other Ambulatory Visit (HOSPITAL_COMMUNITY): Payer: Self-pay | Admitting: Respiratory Therapy

## 2019-05-10 ENCOUNTER — Encounter (HOSPITAL_COMMUNITY)
Admission: EM | Disposition: A | Discharge status: Skilled Nursing Facility | Payer: Self-pay | Source: Home / Self Care | Attending: Family Medicine

## 2019-05-10 DIAGNOSIS — D6481 Anemia due to antineoplastic chemotherapy: Secondary | ICD-10-CM

## 2019-05-10 DIAGNOSIS — R4182 Altered mental status, unspecified: Secondary | ICD-10-CM

## 2019-05-10 DIAGNOSIS — T451X5A Adverse effect of antineoplastic and immunosuppressive drugs, initial encounter: Secondary | ICD-10-CM

## 2019-05-10 HISTORY — PX: VIDEO BRONCHOSCOPY: SHX5072

## 2019-05-10 LAB — CBC
HCT: 29.4 % — ABNORMAL LOW (ref 36.0–46.0)
Hemoglobin: 9.6 g/dL — ABNORMAL LOW (ref 12.0–15.0)
MCH: 36.6 pg — ABNORMAL HIGH (ref 26.0–34.0)
MCHC: 32.7 g/dL (ref 30.0–36.0)
MCV: 112.2 fL — ABNORMAL HIGH (ref 80.0–100.0)
Platelets: 175 10*3/uL (ref 150–400)
RBC: 2.62 MIL/uL — ABNORMAL LOW (ref 3.87–5.11)
RDW: 15.9 % — ABNORMAL HIGH (ref 11.5–15.5)
WBC: 5.9 10*3/uL (ref 4.0–10.5)
nRBC: 0 % (ref 0.0–0.2)

## 2019-05-10 LAB — BASIC METABOLIC PANEL
Anion gap: 13 (ref 5–15)
BUN: 10 mg/dL (ref 8–23)
CO2: 23 mmol/L (ref 22–32)
Calcium: 9 mg/dL (ref 8.9–10.3)
Chloride: 105 mmol/L (ref 98–111)
Creatinine, Ser: 0.99 mg/dL (ref 0.44–1.00)
GFR calc Af Amer: >60 mL/min (ref >60)
GFR calc non Af Amer: 60 mL/min — ABNORMAL LOW (ref >60)
Glucose, Bld: 95 mg/dL (ref 70–99)
Potassium: 3.5 mmol/L (ref 3.5–5.1)
Sodium: 141 mmol/L (ref 135–145)

## 2019-05-10 SURGERY — VIDEO BRONCHOSCOPY WITHOUT FLUORO
Anesthesia: Moderate Sedation | Laterality: Bilateral

## 2019-05-10 MED ORDER — MIDAZOLAM HCL (PF) 10 MG/2ML IJ SOLN
INTRAMUSCULAR | Status: DC | PRN
  Administered 2019-05-10 (×2): 2 mg via INTRAVENOUS
  Administered 2019-05-10: 1 mg via INTRAVENOUS

## 2019-05-10 MED ORDER — MIDAZOLAM HCL (PF) 5 MG/ML IJ SOLN
INTRAMUSCULAR | Status: AC
  Filled 2019-05-10: qty 2

## 2019-05-10 MED ORDER — FENTANYL CITRATE (PF) 100 MCG/2ML IJ SOLN
INTRAMUSCULAR | Status: DC | PRN
  Administered 2019-05-10 (×2): 50 ug via INTRAVENOUS

## 2019-05-10 MED ORDER — PHENYLEPHRINE HCL 0.25 % NA SOLN: 1.0000 | Freq: Four times a day (QID) | NASAL | Status: DC | PRN

## 2019-05-10 MED ORDER — LIDOCAINE HCL 2 % EX GEL
1.0000 "application " | Freq: Once | CUTANEOUS | Status: DC
  Filled 2019-05-10: qty 4250

## 2019-05-10 MED ORDER — SODIUM CHLORIDE (PF) 0.9 % IJ SOLN
INTRAMUSCULAR | Status: AC
  Filled 2019-05-10: qty 50

## 2019-05-10 MED ORDER — IOHEXOL 300 MG/ML  SOLN
75.0000 mL | Freq: Once | INTRAMUSCULAR | Status: AC | PRN
  Administered 2019-05-10: 13:00:00 75 mL via INTRAVENOUS

## 2019-05-10 MED ORDER — HALOPERIDOL LACTATE 5 MG/ML IJ SOLN
1.0000 mg | Freq: Once | INTRAMUSCULAR | Status: AC
  Administered 2019-05-11: 1 mg via INTRAVENOUS
  Filled 2019-05-10: qty 1

## 2019-05-10 MED ORDER — SODIUM CHLORIDE 0.9 % IV SOLN
INTRAVENOUS | Status: DC
  Administered 2019-05-10: 14:00:00 via INTRAVENOUS

## 2019-05-10 MED ORDER — BUTAMBEN-TETRACAINE-BENZOCAINE 2-2-14 % EX AERO: 1.0000 | INHALATION_SPRAY | Freq: Once | CUTANEOUS | Status: DC

## 2019-05-10 MED ORDER — FENTANYL CITRATE (PF) 100 MCG/2ML IJ SOLN
INTRAMUSCULAR | Status: AC
  Filled 2019-05-10: qty 4

## 2019-05-10 MED ORDER — LIDOCAINE HCL 1 % IJ SOLN
INTRAMUSCULAR | Status: DC | PRN
  Administered 2019-05-10: 6 mL via RESPIRATORY_TRACT

## 2019-05-10 MED ORDER — ORAL CARE MOUTH RINSE
15.0000 mL | Freq: Two times a day (BID) | OROMUCOSAL | Status: DC
  Administered 2019-05-10 – 2019-05-17 (×7): 15 mL via OROMUCOSAL

## 2019-05-10 NOTE — Progress Notes (Signed)
Video Bronchoscopy done Intervention Bronchial brushing done Procedure tolerated well.
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
## 2019-05-10 NOTE — Progress Notes (Signed)
05/10/2019  1240  Spoke with Renee from IV team. Patient is on the list to have port re-accessed today. IV team is aware that dressing is dated for 05/03/2019.

## 2019-05-10 NOTE — Plan of Care (Signed)
  Problem: Elimination: Goal: Will not experience complications related to bowel motility Outcome: Progressing   Problem: Pain Managment: Goal: General experience of comfort will improve Outcome: Progressing   Problem: Nutrition: Goal: Adequate nutrition will be maintained Outcome: Progressing   

## 2019-05-10 NOTE — Op Note (Signed)
[facility] Cardiopulmonary Patient Name: Rebekah Henderson Procedure Date: 05/10/2019 MRN: [id number] Attending MD: Chesley Mires , MD Date of Birth: 12/27/1955 CSN: [id number] Age: 63 Admit Type: Inpatient Ethnicity: Not Hispanic or Latino Procedure:            Bronchoscopy Indications:          Atelectasis of the left upper lobe, Atelectasis of the                        left lower lobe Providers:            Chesley Mires, MD, Christin Fudge, Andre Lefort                        RRT,RCP Referring MD:          Medicines:            Midazolam 5 mg IV, Fentanyl 100 mcg IV, Lidocaine 2%                        applied to cords 2 mL Complications:        No immediate complications Estimated Blood Loss: Estimated blood loss: none. Procedure:      Pre-Anesthesia Assessment:      - A History and Physical has been performed. The patient's medications,       allergies and sensitivities have been reviewed.      - The risks and benefits of the procedure and the sedation options and       risks were discussed with the patient. All questions were answered and       informed consent was obtained.      After obtaining informed consent, the bronchoscope was passed under       direct vision. Throughout the procedure, the patient's blood pressure,       pulse, and oxygen saturations were monitored continuously. the [id number]       ([id number] Olympus therapeutic bronchoscope was introduced through the       mouth.      The right mainstem bronchus was entered. The right upper, middle, and       lower lobes were visualized. No endobronchial lesions and mucosa was       normal.      Switched to left main bronchus. Diffuse tumor studding of Lt main       bronchus with about 70% obstruction of airway. Not able to pass bronchus       beyond this point. Performed brush cytology from Lt main bronchus.      The procedure was accomplished without difficulty. The patient tolerated       the  procedure well. The total duration of the procedure was 14 minutes. Findings:      Studding of left mainstem bronchus with tumor. Impression:      - Obstruction of left main bronchus with tumor growth.      - Brushings were obtained. Moderate Sedation:      Moderate (conscious) sedation was personally administered by the       pulmonologist. The following parameters were monitored: oxygen       saturation, heart rate, blood pressure, and response to care. Total       physician intraservice time was 14 minutes. Recommendation:      - Await brushing results. Procedure Code(s):      --- Professional ---  31623, Bronchoscopy, rigid or flexible, including fluoroscopic guidance,       when performed; with brushing or protected brushings      99152, Moderate sedation services provided by the same physician or       other qualified health care professional performing the diagnostic or       therapeutic service that the sedation supports, requiring the presence       of an independent trained observer to assist in the monitoring of the       patient's level of consciousness and physiological status; initial 15       minutes of intraservice time, patient age 38 years or older Diagnosis Code(s):      --- Professional ---      J98.11, Atelectasis      J98.4, Other disorders of lung CPT copyright 2019 [employer]. All rights reserved. The codes documented in this report are preliminary and upon coder review may  be revised to meet current compliance requirements. Chesley Mires, MD Chesley Mires, MD 05/10/2019 2:44:38 PM This report has been signed electronically. Number of Addenda: 0 Scope In: 2:17:08 PM Scope Out: 2:25:13 PM

## 2019-05-10 NOTE — Progress Notes (Addendum)
PROGRESS NOTE    Rebekah Henderson  NTZ:[id number] DOB: 1956-01-25 DOA: 05/07/2019 PCP: Redmond School, MD     Brief Narrative:  Rebekah Henderson is a 63 y.o. 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 Patient was recently admitted on June 1 for ESBL UTI/pyelonephritis and treated with meropenem.  She was converted to ertapenem on June 3 and discharged home for ongoing dosages until June 8.  Patient states she has been receiving chemotherapy for several years and has had intermittent nausea and vomiting for several years as well.  States today she vomited after drinking ginger ale.  She is also been having left lower quadrant abdominal pain for several years.  Also reports having intermittent diarrhea for several years, most recent episode 2 days ago.  States she has been seen at [facility] and another outside hospital and was told her symptoms were due to IBS, acid reflux, and fibromyalgia.  She is also complaining of some pain in her lower sternal and upper abdominal region which she describes as a gurgling sensation.  Also reports having acid reflux.  States for the past few days she has been having sharp left-sided chest pain.  Chest pain is nonexertional.  Reports having chronic shortness of breath, no recent change.  She uses 2 L home oxygen all the time.  In the ED, work up revealed chest x-ray showed total opacification of the left hemithorax, concerning for large left pleural effusion and/or left mainstem bronchial obstruction by hilar mass. Chest CT showed slight interval increase in size of the left hilar and mediastinal mass about the left mainstem bronchus, with increased mass-effect on the left mainstem bronchus.  Near-total atelectasis of the left lung secondary to left mainstem bronchial obstruction with a small pleural effusion.  Also  showing slightly increased infectious or inflammatory groundglass opacity of the right upper lobe.  New events last 24 hours / Subjective: Remains confused today, hallucinating, talks to a man in the room (no one in the room), talking about cleaning a bucket etc. Patient restrained for safety.   Assessment & Plan:   Principal Problem:   Lung collapse Active Problems:   Small cell lung cancer, left (HCC)   Chronic abdominal pain   Chronic nausea   Chronic diarrhea   Lung collapse secondary to left mainstem bronchial obstruction from a lung mass in the setting of small cell lung cancer currently on chemo -Currently on 2 L supplemental oxygen which is the same as her home requirement.  No signs of respiratory distress. Chest CT showing slight interval increase in size of the left hilar and mediastinal mass about the left mainstem bronchus, with increased mass-effect on the left mainstem bronchus.  Near-total atelectasis of the left lung secondary to left mainstem bronchial obstruction with a small pleural effusion.  Also showing slightly increased infectious or inflammatory groundglass opacity of the right upper lobe.  Infection less likely given no fever or leukocytosis.  Procalcitonin 0.10.  COVID-19 rapid test negative. -Pulmonology and oncology consulted, planning for bronchoscopy 6/8 and rad onc referral   Delirium -Per HCPOA, this is not her baseline mentation, although patient has been more confused lately. CT head ordered by oncology   Chronic nausea, vomiting, abdominal pain, diarrhea -Suspect her symptoms are due to combination of factors such as current treatment with chemotherapy, GERD, and underlying IBS -Supportive care. Bentyl, PPI  Atypical chest pain -Substernal chest pain is sharp in nature, intermittent, worse with deep breaths -Troponins have been negative x3  Recent ESBL UTI -Diagnosed during previous admission. Continue ertapenem, last day of treatment 6/8.  Continue pyridium. Repeat urine culture on admission negative.   QTC prolongation on EKG -Avoid QTC prolonging drugs if possible  COPD -Stable  Hypertension -Continue home toprol, norvasc  Hypothyroidism -Continue home Synthroid  Depression and anxiety -Continue home Prozac, Xanax    DVT prophylaxis: Lovenox Code Status: Full code Family Communication: Spoke with HCPOA over the phone  Disposition Plan: Pending bronchoscopy, rad onc referral    Consultants:   Oncology  Pulmonology  Procedures:   None  Antimicrobials:  Anti-infectives (From admission, onward)   Start     Dose/Rate Route Frequency Ordered Stop   05/08/19 1000  ertapenem (INVANZ) IVPB  Status:  Discontinued    Note to Pharmacy:  Indication:  ESBL UTI Last Day of Therapy:  05/10/2019 Labs - Once weekly:  CBC/D and BMP, Labs - Every other week:  ESR and CRP     1 g Intravenous Every 24 hours 05/07/19 2154 05/07/19 2204   05/08/19 1000  ertapenem (INVANZ) 1,000 mg in sodium chloride 0.9 % 100 mL IVPB    Note to Pharmacy:  Indication:ESBL UTI Last Day of Therapy:05/10/2019 Labs - Once weekly:CBC/D and BMP, Labs - Every other week:ESR and CRP   1 g 200 mL/hr over 30 Minutes Intravenous Every 24 hours 05/07/19 2204 05/10/19 1010       Objective: Vitals:   05/10/19 0800 05/10/19 0820 05/10/19 0921 05/10/19 1000  BP:  (!) 142/76 (!) 142/76 (!) 125/53  Pulse:  92  100  Resp:  (!) 21  (!) 24  Temp: 97.7 F (36.5 C)     TempSrc: Oral     SpO2:  97%  95%  Weight:      Height:        Intake/Output Summary (Last 24 hours) at 05/10/2019 1121 Last data filed at 05/10/2019 1010 Gross per 24 hour  Intake 199.92 ml  Output 400 ml  Net -200.08 ml   Filed Weights   05/07/19 1643  Weight: 68 kg    Examination: General exam: Appears calm and comfortable, confused  Respiratory system: Diminished on left  Cardiovascular system: S1 & S2 heard, RRR. No JVD, murmurs, rubs, gallops or clicks.  No pedal edema. Gastrointestinal system: Abdomen is nondistended, soft and nontender. No organomegaly or masses felt. Normal bowel sounds heard. Central nervous system: Alert, very confused Extremities: Symmetric  Skin: No rashes, lesions or ulcers Psychiatry: Hallucinating during exam   Data Reviewed: I have personally reviewed following labs and imaging studies  CBC: Recent Labs  Lab 05/03/19 1855 05/04/19 0517 05/05/19 0508 05/07/19 1706 05/09/19 0500 05/10/19 0421  WBC 3.5* 3.7* 3.0* 4.0 4.1 5.9  NEUTROABS 1.8  --   --  2.6  --   --   HGB 9.0* 9.3* 8.8* 8.7* 9.2* 9.6*  HCT 27.4* 28.5* 26.5* 25.7* 26.7* 29.4*  MCV 110.9* 109.6* 109.1* 109.4* 109.4* 112.2*  PLT 118* 116* 121* 137* 147* 341   Basic Metabolic Panel: Recent Labs  Lab 05/05/19 0508 05/07/19 1706 05/07/19 2150 05/08/19 0104 05/09/19 0500 05/10/19 0421  NA 138 133*  --  135 137 141  K 3.9 3.5  --  3.7 3.4* 3.5  CL 103 99  --  102 103 105  CO2 25 23  --  _0 GLUCOSE 119* 105*  --  85 106* 95  BUN 9 13  --  _0 CREATININE 0.96 0.92  --  0.92 0.81 0.99  CALCIUM 8.8* 8.6*  --  8.5* 9.2 9.0  MG 1.4*  --  1.4*  --   --   --    GFR: Estimated Creatinine Clearance: 53.7 mL/min (by C-G formula based on SCr of 0.99 mg/dL). Liver Function Tests: Recent Labs  Lab 05/07/19 1706  AST 15  ALT 9  ALKPHOS 39  BILITOT 0.5  PROT 6.5  ALBUMIN 3.8   Recent Labs  Lab 05/07/19 1706  LIPASE 22   No results for input(s): AMMONIA in the last 168 hours. Coagulation Profile: No results for input(s): INR, PROTIME in the last 168 hours. Cardiac Enzymes: Recent Labs  Lab 05/07/19 1706 05/07/19 2150 05/08/19 0051 05/08/19 0830  TROPONINI <0.03 <0.03 <0.03 <0.03   BNP (last 3 results) No results for input(s): PROBNP in the last 8760 hours. HbA1C: No results for input(s): HGBA1C in the last 72 hours. CBG: Recent Labs  Lab 05/09/19 1722  GLUCAP 87   Lipid Profile: No results for input(s):  CHOL, HDL, LDLCALC, TRIG, CHOLHDL, LDLDIRECT in the last 72 hours. Thyroid Function Tests: No results for input(s): TSH, T4TOTAL, FREET4, T3FREE, THYROIDAB in the last 72 hours. Anemia Panel: No results for input(s): VITAMINB12, FOLATE, FERRITIN, TIBC, IRON, RETICCTPCT in the last 72 hours. Sepsis Labs: Recent Labs  Lab 05/07/19 2150  PROCALCITON 0.10    Recent Results (from the past 240 hour(s))  Urine culture     Status: Abnormal   Collection Time: 05/03/19  6:19 PM  Result Value Ref Range Status   Specimen Description   Final    URINE, RANDOM Performed at [facility], [address], [place], [place] [address]    Special Requests   Final    NONE Performed at [facility], [address] [place], [place] [address]    Culture (A)  Final    >=100,000 COLONIES/mL ESCHERICHIA COLI Confirmed Extended Spectrum Beta-Lactamase Producer (ESBL).  In bloodstream infections from ESBL organisms, carbapenems are preferred over piperacillin/tazobactam. They are shown to have a lower risk of mortality.    Report Status 05/05/2019 FINAL  Final   Organism ID, Bacteria ESCHERICHIA COLI (A)  Final      Susceptibility   Escherichia coli - MIC*    AMPICILLIN >=32 RESISTANT Resistant     CEFAZOLIN >=64 RESISTANT Resistant     CEFTRIAXONE >=64 RESISTANT Resistant     CIPROFLOXACIN >=4 RESISTANT Resistant     GENTAMICIN <=1 SENSITIVE Sensitive     IMIPENEM <=0.25 SENSITIVE Sensitive     NITROFURANTOIN 32 SENSITIVE Sensitive     TRIMETH/SULFA >=320 RESISTANT Resistant     AMPICILLIN/SULBACTAM >=32 RESISTANT Resistant     PIP/TAZO <=4 SENSITIVE Sensitive     Extended ESBL POSITIVE Resistant     * >=100,000 COLONIES/mL ESCHERICHIA COLI  SARS Coronavirus 2 (CEPHEID - Performed in [place] hospital lab), Hosp Order     Status: None   Collection Time: 05/03/19  9:35 PM  Result Value Ref Range Status   SARS Coronavirus 2 NEGATIVE NEGATIVE Final    Comment: (NOTE) If result is NEGATIVE  SARS-CoV-2 target nucleic acids are NOT DETECTED. The SARS-CoV-2 RNA is generally detectable in upper and lower  respiratory specimens during the acute phase of infection. The lowest  concentration of SARS-CoV-2 viral copies this assay can detect is 250  copies / mL. A negative result does not  preclude SARS-CoV-2 infection  and should not be used as the sole basis for treatment or other  patient management decisions.  A negative result may occur with  improper specimen collection / handling, submission of specimen other  than nasopharyngeal swab, presence of viral mutation(s) within the  areas targeted by this assay, and inadequate number of viral copies  (<250 copies / mL). A negative result must be combined with clinical  observations, patient history, and epidemiological information. If result is POSITIVE SARS-CoV-2 target nucleic acids are DETECTED. The SARS-CoV-2 RNA is generally detectable in upper and lower  respiratory specimens dur ing the acute phase of infection.  Positive  results are indicative of active infection with SARS-CoV-2.  Clinical  correlation with patient history and other diagnostic information is  necessary to determine patient infection status.  Positive results do  not rule out bacterial infection or co-infection with other viruses. If result is PRESUMPTIVE POSTIVE SARS-CoV-2 nucleic acids MAY BE PRESENT.   A presumptive positive result was obtained on the submitted specimen  and confirmed on repeat testing.  While 2019 novel coronavirus  (SARS-CoV-2) nucleic acids may be present in the submitted sample  additional confirmatory testing may be necessary for epidemiological  and / or clinical management purposes  to differentiate between  SARS-CoV-2 and other Sarbecovirus currently known to infect humans.  If clinically indicated additional testing with an alternate test  methodology [id number]) is advised. The SARS-CoV-2 RNA is generally  detectable in upper  and lower respiratory sp ecimens during the acute  phase of infection. The expected result is Negative. Fact Sheet for Patients:  StrictlyIdeas.no Fact Sheet for Healthcare Providers: BankingDealers.co.za This test is not yet approved or cleared by the [place] FDA and has been authorized for detection and/or diagnosis of SARS-CoV-2 by FDA under an Emergency Use Authorization (EUA).  This EUA will remain in effect (meaning this test can be used) for the duration of the COVID-19 declaration under Section 564(b)(1) of the Act, 21 U.S.C. section 360bbb-3(b)(1), unless the authorization is terminated or revoked sooner. Performed at [facility], [address], [place], [place] [address]   Culture, blood (routine x 2)     Status: None   Collection Time: 05/03/19 11:05 PM  Result Value Ref Range Status   Specimen Description BLOOD RIGHT ARM  Final   Special Requests   Final    BOTTLES DRAWN AEROBIC AND ANAEROBIC Blood Culture adequate volume   Culture   Final    NO GROWTH 5 DAYS Performed at [facility], [address] [place], [place] [address]    Report Status 05/08/2019 FINAL  Final  Culture, blood (routine x 2)     Status: None   Collection Time: 05/03/19 11:07 PM  Result Value Ref Range Status   Specimen Description BLOOD RIGHT ARM  Final   Special Requests   Final    BOTTLES DRAWN AEROBIC AND ANAEROBIC Blood Culture adequate volume   Culture   Final    NO GROWTH 5 DAYS Performed at [facility], [address] [place], [place] [address]    Report Status 05/08/2019 FINAL  Final  SARS Coronavirus 2 (CEPHEID - Performed in [place] hospital lab), Hosp Order     Status: None   Collection Time: 05/07/19  5:32 PM  Result Value Ref Range Status   SARS Coronavirus 2 NEGATIVE NEGATIVE Final    Comment: (NOTE) If result is NEGATIVE SARS-CoV-2 target nucleic acids are NOT DETECTED. The SARS-CoV-2 RNA is generally  detectable in  upper and lower  respiratory specimens during the acute phase of infection. The lowest  concentration of SARS-CoV-2 viral copies this assay can detect is 250  copies / mL. A negative result does not preclude SARS-CoV-2 infection  and should not be used as the sole basis for treatment or other  patient management decisions.  A negative result may occur with  improper specimen collection / handling, submission of specimen other  than nasopharyngeal swab, presence of viral mutation(s) within the  areas targeted by this assay, and inadequate number of viral copies  (<250 copies / mL). A negative result must be combined with clinical  observations, patient history, and epidemiological information. If result is POSITIVE SARS-CoV-2 target nucleic acids are DETECTED. The SARS-CoV-2 RNA is generally detectable in upper and lower  respiratory specimens dur ing the acute phase of infection.  Positive  results are indicative of active infection with SARS-CoV-2.  Clinical  correlation with patient history and other diagnostic information is  necessary to determine patient infection status.  Positive results do  not rule out bacterial infection or co-infection with other viruses. If result is PRESUMPTIVE POSTIVE SARS-CoV-2 nucleic acids MAY BE PRESENT.   A presumptive positive result was obtained on the submitted specimen  and confirmed on repeat testing.  While 2019 novel coronavirus  (SARS-CoV-2) nucleic acids may be present in the submitted sample  additional confirmatory testing may be necessary for epidemiological  and / or clinical management purposes  to differentiate between  SARS-CoV-2 and other Sarbecovirus currently known to infect humans.  If clinically indicated additional testing with an alternate test  methodology [id number]) is advised. The SARS-CoV-2 RNA is generally  detectable in upper and lower respiratory sp ecimens during the acute  phase of infection. The expected result is  Negative. Fact Sheet for Patients:  StrictlyIdeas.no Fact Sheet for Healthcare Providers: BankingDealers.co.za This test is not yet approved or cleared by the [place] FDA and has been authorized for detection and/or diagnosis of SARS-CoV-2 by FDA under an Emergency Use Authorization (EUA).  This EUA will remain in effect (meaning this test can be used) for the duration of the COVID-19 declaration under Section 564(b)(1) of the Act, 21 U.S.C. section 360bbb-3(b)(1), unless the authorization is terminated or revoked sooner. Performed at [facility], [place] [address] [place], [place] [address]   Urine culture     Status: None   Collection Time: 05/07/19  7:59 PM  Result Value Ref Range Status   Specimen Description   Final    URINE, RANDOM Performed at [facility] [address] [place], [place] [address]    Special Requests   Final    NONE Performed at [facility], [place] [address], [place], [place] [address]    Culture   Final    NO GROWTH Performed at [facility], [place] [address] [place], [place] [address]    Report Status 05/09/2019 FINAL  Final  MRSA PCR Screening     Status: None   Collection Time: 05/07/19 11:17 PM  Result Value Ref Range Status   MRSA by PCR NEGATIVE NEGATIVE Final    Comment:        The GeneXpert MRSA Assay (FDA approved for NASAL specimens only), is one component of a comprehensive MRSA colonization surveillance program. It is not intended to diagnose MRSA infection nor to guide or monitor treatment for MRSA infections. Performed at [facility], [address] [place], [place] [address]        Radiology Studies: No results found.    Scheduled Meds: . amLODipine  5 mg Oral Daily  . Chlorhexidine Gluconate Cloth  6 each Topical Daily  . estradiol  2 mg Oral Daily  . FLUoxetine  40 mg Oral Daily  .  levothyroxine  50 mcg Oral Q0600  . magnesium oxide  400 mg Oral BID  . mouth rinse  15 mL Mouth Rinse BID  . metoprolol succinate  25 mg Oral Daily  . pantoprazole  40 mg Oral Daily  . phenazopyridine  200 mg Oral BID   Continuous Infusions:    LOS: 3 days    Time spent: 25 minutes   Dessa Phi, DO [facility] [email or web address] 05/10/2019, 11:21 AM

## 2019-05-10 NOTE — Progress Notes (Addendum)
NAME:  Rebekah Henderson, MRN:  [id number], DOB:  [birth year], LOS: 3 ADMISSION DATE:  05/07/2019, CONSULTATION DATE:  05/08/2019 REFERRING MD:  Dr. Maylene Roes, [facility], CHIEF COMPLAINT:  Vomiting  Brief History   63 yo female former smoker presented to ER with vomiting.  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  She had admission 05/03/19 for ESBL UTI with pyelonephritis.  She was found to have opacification of Lt hemithorax on chest imaging, and PCCM consulted to assess.  History of present illness   She was dx with SCLC in October 2017.  Treated previously with cisplatin, carboplatin, etoposide, cranial radiation, tecentriq.  Most recently she has been on cisplatin, irinotecan due to disease progression on other therapies.  She is followed by Dr. Julien Nordmann.  She had CT chest in May 2020 (reviewed by me), and showed Lt upper lung volume lose and consolidation that was stable with small left effusion, mild centrilobular emphysema.  CT chest from 05/07/19 showed increased size of Lt hilar and mediastinal mass with mass effect on Lt main bronchus, resulting in volume loss and mediastinal shift to Lt, enlarged subcarinal node, involvement of midportion of esophagus by mass (reviewed by me).  She feels more short of breath compared to baseline, and has discomfort in Lt chest.  Not having much cough.  Not having sputum, fever, or hemoptysis.  Feels like food gets stuck in her mid chest, and has trouble swallowing.  Also still has some nausea.  No vomiting episodes since 05/07/19.  Past Medical History  COPD, Chronic hypoxic respiratory failure on 2 liters oxygen, GERD, HTN, Hypothyroidism, IBS, Fibromyalgia  Significant Hospital Events   6/05 Admit  Consults:    Procedures:    Significant Diagnostic Tests:  CT chest 05/07/19 >> showed increased size of Lt hilar and mediastinal mass with mass effect on Lt main bronchus, resulting in volume loss and mediastinal shift to Lt, enlarged subcarinal node,  involvement of midportion of esophagus by mass  Micro Data:  COVID 6/05 >> negative Urine 6/05 >> no growth  Antimicrobials:  Ertapenem 6/03 >>   Interim history/subjective:  Not having nausea.  Denies chest/abd pain.  Objective   Blood pressure (!) 161/82, pulse (!) 108, temperature 97.7 F (36.5 C), temperature source Oral, resp. rate (!) 25, height 5\' 4"  (1.626 m), weight 68 kg, SpO2 93 %.        Intake/Output Summary (Last 24 hours) at 05/10/2019 0856 Last data filed at 05/09/2019 2200 Gross per 24 hour  Intake 99.92 ml  Output 400 ml  Net -300.08 ml   Filed Weights   05/07/19 1643  Weight: 68 kg    Examination:  General - alert Eyes - pupils reactive ENT - no sinus tenderness, no stridor Cardiac - regular rate/rhythm, no murmur Chest - decreased BS on Lt, no wheeze Abdomen - soft, non tender, + bowel sounds Extremities - no cyanosis, clubbing, or edema Skin - no rashes Neuro - follows commands appropriately   Discussion:  She has extrinsic compression of Lt main bronchus most likely by expanding mass and adenopathy in setting of known history of small cell lung cancer.    Assessment & Plan:   Extrinsic compression of Lt main bronchus with Lt lung atelectasis in setting of Small Cell Lung Cancer. Plan - bronchoscopy scheduled for 130 pm on 05/10/19 - bronchoscopy procedure discussed; risks detailed as bleeding, infection, pneumothorax, and non diagnosis - oncology consulted and will arrange for radiation oncology assessment  Hx  of COPD with emphysema. Plan - prn BDs  ESBL UTI. Plan - Abx per primary team  Best practice:  Diet: NPO in anticipation of bronchoscopy DVT prophylaxis: hold lovenox in anticipation of bronchoscopy GI prophylaxis: Protonix Mobility: As tolerated Code Status: Full code  Labs    CMP Latest Ref Rng & Units 05/10/2019 05/09/2019 05/08/2019  Glucose 70 - 99 mg/dL 95 106(H) 85  BUN 8 - 23 mg/dL 10 8 10   Creatinine 0.44 - 1.00 mg/dL  0.99 0.81 0.92  Sodium 135 - 145 mmol/L 141 137 135  Potassium 3.5 - 5.1 mmol/L 3.5 3.4(L) 3.7  Chloride 98 - 111 mmol/L 105 103 102  CO2 22 - 32 mmol/L 23 25 24   Calcium 8.9 - 10.3 mg/dL 9.0 9.2 8.5(L)  Total Protein 6.5 - 8.1 g/dL - - -  Total Bilirubin 0.3 - 1.2 mg/dL - - -  Alkaline Phos 38 - 126 U/L - - -  AST 15 - 41 U/L - - -  ALT 0 - 44 U/L - - -   CBC Latest Ref Rng & Units 05/10/2019 05/09/2019 05/07/2019  WBC 4.0 - 10.5 K/uL 5.9 4.1 4.0  Hemoglobin 12.0 - 15.0 g/dL 9.6(L) 9.2(L) 8.7(L)  Hematocrit 36.0 - 46.0 % 29.4(L) 26.7(L) 25.7(L)  Platelets 150 - 400 K/uL 175 147(L) 137(L)   CBG (last 3)  Recent Labs    05/09/19 1722  GLUCAP 87    Chesley Mires, MD [place] 05/10/2019, 9:00 AM

## 2019-05-11 ENCOUNTER — Ambulatory Visit
Admit: 2019-05-11 | Discharge: 2019-05-11 | Disposition: A | Discharge status: Home / Self Care | Payer: Medicare Other | Attending: Radiation Oncology | Admitting: Radiation Oncology

## 2019-05-11 ENCOUNTER — Encounter (HOSPITAL_COMMUNITY): Payer: Self-pay | Admitting: *Deleted

## 2019-05-11 ENCOUNTER — Ambulatory Visit
Admit: 2019-05-11 | Discharge: 2019-05-11 | Disposition: A | Discharge status: Home / Self Care | Payer: Medicare Other | Source: Ambulatory Visit | Attending: Radiation Oncology | Admitting: Radiation Oncology

## 2019-05-11 DIAGNOSIS — C3412 Malignant neoplasm of upper lobe, left bronchus or lung: Secondary | ICD-10-CM

## 2019-05-11 DIAGNOSIS — Z510 Encounter for antineoplastic radiation therapy: Secondary | ICD-10-CM | POA: Insufficient documentation

## 2019-05-11 LAB — CBC
HCT: 28.5 % — ABNORMAL LOW (ref 36.0–46.0)
Hemoglobin: 9.2 g/dL — ABNORMAL LOW (ref 12.0–15.0)
MCH: 36.1 pg — ABNORMAL HIGH (ref 26.0–34.0)
MCHC: 32.3 g/dL (ref 30.0–36.0)
MCV: 111.8 fL — ABNORMAL HIGH (ref 80.0–100.0)
Platelets: 152 10*3/uL (ref 150–400)
RBC: 2.55 MIL/uL — ABNORMAL LOW (ref 3.87–5.11)
RDW: 15.6 % — ABNORMAL HIGH (ref 11.5–15.5)
WBC: 6.1 10*3/uL (ref 4.0–10.5)
nRBC: 0 % (ref 0.0–0.2)

## 2019-05-11 LAB — BASIC METABOLIC PANEL
Anion gap: 12 (ref 5–15)
BUN: 13 mg/dL (ref 8–23)
CO2: 23 mmol/L (ref 22–32)
Calcium: 8.9 mg/dL (ref 8.9–10.3)
Chloride: 106 mmol/L (ref 98–111)
Creatinine, Ser: 1.44 mg/dL — ABNORMAL HIGH (ref 0.44–1.00)
GFR calc Af Amer: 44 mL/min — ABNORMAL LOW (ref >60)
GFR calc non Af Amer: 38 mL/min — ABNORMAL LOW (ref >60)
Glucose, Bld: 94 mg/dL (ref 70–99)
Potassium: 3.6 mmol/L (ref 3.5–5.1)
Sodium: 141 mmol/L (ref 135–145)

## 2019-05-11 MED ORDER — LIP MEDEX EX OINT
TOPICAL_OINTMENT | CUTANEOUS | Status: AC
  Filled 2019-05-11: qty 7

## 2019-05-11 MED ORDER — DIPHENHYDRAMINE HCL 12.5 MG/5ML PO ELIX
12.5000 mg | ORAL_SOLUTION | Freq: Once | ORAL | Status: AC
  Administered 2019-05-11: 12.5 mg via ORAL
  Filled 2019-05-11: qty 5

## 2019-05-11 MED ORDER — ENOXAPARIN SODIUM 40 MG/0.4ML ~~LOC~~ SOLN
40.0000 mg | SUBCUTANEOUS | Status: DC
  Administered 2019-05-11 – 2019-05-17 (×7): 40 mg via SUBCUTANEOUS
  Filled 2019-05-11 (×7): qty 0.4

## 2019-05-11 MED ORDER — SODIUM CHLORIDE 0.9 % IV SOLN
INTRAVENOUS | Status: DC
  Administered 2019-05-11 – 2019-05-17 (×5): via INTRAVENOUS

## 2019-05-11 NOTE — Progress Notes (Signed)
NAME:  Rebekah Henderson, MRN:  [id number], DOB:  02/23/1956, LOS: 4 ADMISSION DATE:  05/07/2019, CONSULTATION DATE:  05/08/2019 REFERRING MD:  Dr. Maylene Roes, [facility], CHIEF COMPLAINT:  Vomiting  Brief History   63 yo female former smoker presented to ER with vomiting.  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  She had admission 05/03/19 for ESBL UTI with pyelonephritis.  She was found to have opacification of Lt hemithorax on chest imaging, and PCCM consulted to assess.  History of present illness   She was dx with SCLC in October 2017.  Treated previously with cisplatin, carboplatin, etoposide, cranial radiation, tecentriq.  Most recently she has been on cisplatin, irinotecan due to disease progression on other therapies.  She is followed by Dr. Julien Nordmann.  She had CT chest in May 2020 (reviewed by me), and showed Lt upper lung volume lose and consolidation that was stable with small left effusion, mild centrilobular emphysema.  CT chest from 05/07/19 showed increased size of Lt hilar and mediastinal mass with mass effect on Lt main bronchus, resulting in volume loss and mediastinal shift to Lt, enlarged subcarinal node, involvement of midportion of esophagus by mass (reviewed by me).  She feels more short of breath compared to baseline, and has discomfort in Lt chest.  Not having much cough.  Not having sputum, fever, or hemoptysis.  Feels like food gets stuck in her mid chest, and has trouble swallowing.  Also still has some nausea.  No vomiting episodes since 05/07/19.  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  Still has discomfort in Lt chest.  Objective   Blood pressure (!) 150/69, pulse 96, temperature 98.4 F (36.9 C), temperature source Oral, resp. rate 18, height 5\' 4"  (1.626 m), weight 68 kg, SpO2 97 %.        Intake/Output Summary (Last 24 hours) at 05/11/2019 0826 Last data filed at 05/11/2019 0400 Gross per 24 hour  Intake 138.67 ml  Output 400 ml  Net -261.33 ml   Filed Weights   05/07/19 1643  Weight: 68 kg    Examination:  General - fidgety Eyes - pupils reactive ENT - no sinus tenderness, no stridor Cardiac - regular rate/rhythm, no murmur Chest - decreased BS on Lt Abdomen - soft, non tender, + bowel sounds Extremities - no cyanosis, clubbing, or edema Skin - no rashes Neuro - mildly confused, follows simple commands   Discussion:  Bronchoscopy showed diffuse studding of Lt main stem bronchus with airway narrowing.    Assessment & Plan:   Obstruction of Lt main bronchus with Lt lung atelectasis in setting of Small Cell Lung Cancer. Plan - oncology and radiation oncology consulted  Hx of COPD with emphysema. Plan - prn BDs  ESBL UTI. Plan - Abx per primary team  Hypoactive delirium. Plan - per primary team  Best practice:  Diet: Regular diet DVT prophylaxis: lovenox GI prophylaxis: Protonix Mobility: As tolerated Code Status: Full code  PCCM will sign off.  Please call if additional help needed.  Labs    CMP Latest Ref Rng & Units 05/11/2019 05/10/2019 05/09/2019  Glucose 70 - 99 mg/dL 94 95 106(H)  BUN 8 - 23 mg/dL 13 10 8   Creatinine 0.44 - 1.00 mg/dL 1.44(H) 0.99  0.81  Sodium 135 - 145 mmol/L 141 141 137  Potassium 3.5 - 5.1 mmol/L 3.6 3.5 3.4(L)  Chloride 98 - 111 mmol/L 106 105 103  CO2 22 - 32 mmol/L 23 23 25   Calcium 8.9 - 10.3 mg/dL 8.9 9.0 9.2  Total Protein 6.5 - 8.1 g/dL - - -  Total Bilirubin 0.3 - 1.2 mg/dL - - -  Alkaline Phos 38 - 126 U/L - - -  AST 15 - 41 U/L - - -  ALT 0 - 44 U/L - - -   CBC Latest Ref Rng & Units 05/11/2019 05/10/2019 05/09/2019  WBC 4.0 - 10.5 K/uL 6.1 5.9 4.1  Hemoglobin 12.0 - 15.0 g/dL 9.2(L) 9.6(L) 9.2(L)  Hematocrit 36.0 - 46.0 % 28.5(L) 29.4(L) 26.7(L)  Platelets 150 - 400 K/uL 152 175 147(L)   CBG (last 3)  Recent Labs    05/09/19 1722  GLUCAP 87    Chesley Mires, MD [facility] 05/11/2019, 8:26 AM

## 2019-05-11 NOTE — Progress Notes (Addendum)
PROGRESS NOTE    Rebekah Henderson  OHY:[id number] DOB: May 02, 1956 DOA: 05/07/2019 PCP: Redmond School, MD     Brief Narrative:  Rebekah Henderson is a 63 y.o. 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 Patient was recently admitted on June 1 for ESBL UTI/pyelonephritis and treated with meropenem.  She was converted to ertapenem on June 3 and discharged home for ongoing dosages until June 8.  Patient states she has been receiving chemotherapy for several years and has had intermittent nausea and vomiting for several years as well.  States today she vomited after drinking ginger ale.  She is also been having left lower quadrant abdominal pain for several years.  Also reports having intermittent diarrhea for several years, most recent episode 2 days ago.  States she has been seen at [facility] and another outside hospital and was told her symptoms were due to IBS, acid reflux, and fibromyalgia.  She is also complaining of some pain in her lower sternal and upper abdominal region which she describes as a gurgling sensation.  Also reports having acid reflux.  States for the past few days she has been having sharp left-sided chest pain.  Chest pain is nonexertional.  Reports having chronic shortness of breath, no recent change.  She uses 2 L home oxygen all the time.  In the ED, work up revealed chest x-ray showed total opacification of the left hemithorax, concerning for large left pleural effusion and/or left mainstem bronchial obstruction by hilar mass. Chest CT showed slight interval increase in size of the left hilar and mediastinal mass about the left mainstem bronchus, with increased mass-effect on the left mainstem bronchus.  Near-total atelectasis of the left lung secondary to left mainstem bronchial obstruction with a small pleural effusion.  Also  showing slightly increased infectious or inflammatory groundglass opacity of the right upper lobe.  She underwent bronchoscopy on 6/8.  New events last 24 hours / Subjective: Remains confused today.  No other acute events overnight  Assessment & Plan:   Principal Problem:   Lung collapse Active Problems:   Small cell lung cancer, left (HCC)   Chronic abdominal pain   Chronic nausea   Chronic diarrhea   Lung collapse secondary to left mainstem bronchial obstruction from a lung mass in the setting of small cell lung cancer currently on chemo -Currently on 2 L supplemental oxygen which is the same as her home requirement.  No signs of respiratory distress. Chest CT showing slight interval increase in size of the left hilar and mediastinal mass about the left mainstem bronchus, with increased mass-effect on the left mainstem bronchus.  Near-total atelectasis of the left lung secondary to left mainstem bronchial obstruction with a small pleural effusion.  Also showing slightly increased infectious or inflammatory groundglass opacity of the right upper lobe.  Infection less likely given no fever or leukocytosis.  Procalcitonin 0.10.  COVID-19 rapid test negative. -Pulmonology and oncology consulted -Status post bronchoscopy 6/8 -Radiation oncology consulted  Delirium -Per HCPOA, this is not her baseline mentation, although patient has been more confused lately. CT head ordered by oncology she did not reveal signs of metastases.  Delirium precautions ordered  Acute kidney injury -Baseline creatinine around 0.9-1.  Acutely elevated 1.44 this morning.  Start IV fluids and trend BMP  Chronic nausea, vomiting, abdominal pain, diarrhea -Suspect her symptoms are due to combination  of factors such as current treatment with chemotherapy, GERD, and underlying IBS -Supportive care. Bentyl, PPI   Atypical chest pain -Substernal chest pain is sharp in nature, intermittent, worse with deep  breaths -Troponins have been negative x3  Recent ESBL UTI -Diagnosed during previous admission. Continue ertapenem, last day of treatment 6/8. Continue pyridium. Repeat urine culture on admission negative.   QTC prolongation on EKG -Avoid QTC prolonging drugs if possible  COPD -Stable  Hypertension -Continue home toprol, norvasc  Hypothyroidism -Continue home Synthroid  Depression and anxiety -Continue home Prozac, Xanax    DVT prophylaxis: Lovenox Code Status: Full code Family Communication: None today.  Spoke with HCPOA over the phone 6/8 Disposition Plan: Pending rad onc referral.  Transfer out of stepdown unit today   Consultants:   Oncology  Pulmonology signed off 6/9  Radiation oncology  Procedures:   None  Antimicrobials:  Anti-infectives (From admission, onward)   Start     Dose/Rate Route Frequency Ordered Stop   05/08/19 1000  ertapenem [facility]) IVPB  Status:  Discontinued    Note to Pharmacy:  Indication:  ESBL UTI Last Day of Therapy:  05/10/2019 Labs - Once weekly:  CBC/D and BMP, Labs - Every other week:  ESR and CRP     1 g Intravenous Every 24 hours 05/07/19 2154 05/07/19 2204   05/08/19 1000  ertapenem (INVANZ) 1,000 mg in sodium chloride 0.9 % 100 mL IVPB    Note to Pharmacy:  Indication:ESBL UTI Last Day of Therapy:05/10/2019 Labs - Once weekly:CBC/D and BMP, Labs - Every other week:ESR and CRP   1 g 200 mL/hr over 30 Minutes Intravenous Every 24 hours 05/07/19 2204 05/10/19 1010       Objective: Vitals:   05/11/19 0405 05/11/19 0600 05/11/19 0706 05/11/19 0800  BP:  (!) 150/69  (!) 164/74  Pulse: 79 90 96 96  Resp: 19 (!) _0 Temp:    98.4 F (36.9 C)  TempSrc:    Oral  SpO2: 95% 97% 97%   Weight:      Height:        Intake/Output Summary (Last 24 hours) at 05/11/2019 1051 Last data filed at 05/11/2019 0400 Gross per 24 hour  Intake 38.67 ml  Output 400 ml  Net -361.33 ml   Filed Weights   05/07/19  1643  Weight: 68 kg    Examination: General exam: Appears calm and comfortable, remains confused Declined further physical examination today Remains in bilateral upper extremity restraints   Data Reviewed: I have personally reviewed following labs and imaging studies  CBC: Recent Labs  Lab 05/05/19 0508 05/07/19 1706 05/09/19 0500 05/10/19 0421 05/11/19 0416  WBC 3.0* 4.0 4.1 5.9 6.1  NEUTROABS  --  2.6  --   --   --   HGB 8.8* 8.7* 9.2* 9.6* 9.2*  HCT 26.5* 25.7* 26.7* 29.4* 28.5*  MCV 109.1* 109.4* 109.4* 112.2* 111.8*  PLT 121* 137* 147* 175 403   Basic Metabolic Panel: Recent Labs  Lab 05/05/19 0508 05/07/19 1706 05/07/19 2150 05/08/19 0104 05/09/19 0500 05/10/19 0421 05/11/19 0416  NA 138 133*  --  135 137 141 141  K 3.9 3.5  --  3.7 3.4* 3.5 3.6  CL 103 99  --  102 103 105 106  CO2 25 23  --  _1 GLUCOSE 119* 105*  --  85 106* 95 94  BUN 9 13  --  _2 CREATININE  0.96 0.92  --  0.92 0.81 0.99 1.44*  CALCIUM 8.8* 8.6*  --  8.5* 9.2 9.0 8.9  MG 1.4*  --  1.4*  --   --   --   --    GFR: Estimated Creatinine Clearance: 36.9 mL/min (A) (by C-G formula based on SCr of 1.44 mg/dL (H)). Liver Function Tests: Recent Labs  Lab 05/07/19 1706  AST 15  ALT 9  ALKPHOS 39  BILITOT 0.5  PROT 6.5  ALBUMIN 3.8   Recent Labs  Lab 05/07/19 1706  LIPASE 22   No results for input(s): AMMONIA in the last 168 hours. Coagulation Profile: No results for input(s): INR, PROTIME in the last 168 hours. Cardiac Enzymes: Recent Labs  Lab 05/07/19 1706 05/07/19 2150 05/08/19 0051 05/08/19 0830  TROPONINI <0.03 <0.03 <0.03 <0.03   BNP (last 3 results) No results for input(s): PROBNP in the last 8760 hours. HbA1C: No results for input(s): HGBA1C in the last 72 hours. CBG: Recent Labs  Lab 05/09/19 1722  GLUCAP 87   Lipid Profile: No results for input(s): CHOL, HDL, LDLCALC, TRIG, CHOLHDL, LDLDIRECT in the last 72 hours. Thyroid Function  Tests: No results for input(s): TSH, T4TOTAL, FREET4, T3FREE, THYROIDAB in the last 72 hours. Anemia Panel: No results for input(s): VITAMINB12, FOLATE, FERRITIN, TIBC, IRON, RETICCTPCT in the last 72 hours. Sepsis Labs: Recent Labs  Lab 05/07/19 2150  PROCALCITON 0.10    Recent Results (from the past 240 hour(s))  Urine culture     Status: Abnormal   Collection Time: 05/03/19  6:19 PM  Result Value Ref Range Status   Specimen Description   Final    URINE, RANDOM Performed at [facility], [address], [place], [place] [address]    Special Requests   Final    NONE Performed at [facility], [address] [place], [place] [address]    Culture (A)  Final    >=100,000 COLONIES/mL ESCHERICHIA COLI Confirmed Extended Spectrum Beta-Lactamase Producer (ESBL).  In bloodstream infections from ESBL organisms, carbapenems are preferred over piperacillin/tazobactam. They are shown to have a lower risk of mortality.    Report Status 05/05/2019 FINAL  Final   Organism ID, Bacteria ESCHERICHIA COLI (A)  Final      Susceptibility   Escherichia coli - MIC*    AMPICILLIN >=32 RESISTANT Resistant     CEFAZOLIN >=64 RESISTANT Resistant     CEFTRIAXONE >=64 RESISTANT Resistant     CIPROFLOXACIN >=4 RESISTANT Resistant     GENTAMICIN <=1 SENSITIVE Sensitive     IMIPENEM <=0.25 SENSITIVE Sensitive     NITROFURANTOIN 32 SENSITIVE Sensitive     TRIMETH/SULFA >=320 RESISTANT Resistant     AMPICILLIN/SULBACTAM >=32 RESISTANT Resistant     PIP/TAZO <=4 SENSITIVE Sensitive     Extended ESBL POSITIVE Resistant     * >=100,000 COLONIES/mL ESCHERICHIA COLI  SARS Coronavirus 2 (CEPHEID - Performed in [place] hospital lab), Hosp Order     Status: None   Collection Time: 05/03/19  9:35 PM  Result Value Ref Range Status   SARS Coronavirus 2 NEGATIVE NEGATIVE Final    Comment: (NOTE) If result is NEGATIVE SARS-CoV-2 target nucleic acids are NOT DETECTED. The SARS-CoV-2 RNA is generally detectable  in upper and lower  respiratory specimens during the acute phase of infection. The lowest  concentration of SARS-CoV-2 viral copies this assay can detect is 250  copies / mL. A negative result does not preclude SARS-CoV-2 infection  and should not be  used as the sole basis for treatment or other  patient management decisions.  A negative result may occur with  improper specimen collection / handling, submission of specimen other  than nasopharyngeal swab, presence of viral mutation(s) within the  areas targeted by this assay, and inadequate number of viral copies  (<250 copies / mL). A negative result must be combined with clinical  observations, patient history, and epidemiological information. If result is POSITIVE SARS-CoV-2 target nucleic acids are DETECTED. The SARS-CoV-2 RNA is generally detectable in upper and lower  respiratory specimens dur ing the acute phase of infection.  Positive  results are indicative of active infection with SARS-CoV-2.  Clinical  correlation with patient history and other diagnostic information is  necessary to determine patient infection status.  Positive results do  not rule out bacterial infection or co-infection with other viruses. If result is PRESUMPTIVE POSTIVE SARS-CoV-2 nucleic acids MAY BE PRESENT.   A presumptive positive result was obtained on the submitted specimen  and confirmed on repeat testing.  While 2019 novel coronavirus  (SARS-CoV-2) nucleic acids may be present in the submitted sample  additional confirmatory testing may be necessary for epidemiological  and / or clinical management purposes  to differentiate between  SARS-CoV-2 and other Sarbecovirus currently known to infect humans.  If clinically indicated additional testing with an alternate test  methodology [phone]) is advised. The SARS-CoV-2 RNA is generally  detectable in upper and lower respiratory sp ecimens during the acute  phase of infection. The expected result is  Negative. Fact Sheet for Patients:  StrictlyIdeas.no Fact Sheet for Healthcare Providers: BankingDealers.co.za This test is not yet approved or cleared by the [place] FDA and has been authorized for detection and/or diagnosis of SARS-CoV-2 by FDA under an Emergency Use Authorization (EUA).  This EUA will remain in effect (meaning this test can be used) for the duration of the COVID-19 declaration under Section 564(b)(1) of the Act, 21 U.S.C. section 360bbb-3(b)(1), unless the authorization is terminated or revoked sooner. Performed at [facility], [address] [place], [place] [address]   Culture, blood (routine x 2)     Status: None   Collection Time: 05/03/19 11:05 PM  Result Value Ref Range Status   Specimen Description BLOOD RIGHT ARM  Final   Special Requests   Final    BOTTLES DRAWN AEROBIC AND ANAEROBIC Blood Culture adequate volume   Culture   Final    NO GROWTH 5 DAYS Performed at [facility], [address], [place], [place] [address]    Report Status 05/08/2019 FINAL  Final  Culture, blood (routine x 2)     Status: None   Collection Time: 05/03/19 11:07 PM  Result Value Ref Range Status   Specimen Description BLOOD RIGHT ARM  Final   Special Requests   Final    BOTTLES DRAWN AEROBIC AND ANAEROBIC Blood Culture adequate volume   Culture   Final    NO GROWTH 5 DAYS Performed at [facility], [address], [place],  [address]    Report Status 05/08/2019 FINAL  Final  SARS Coronavirus 2 (CEPHEID - Performed in [place] hospital lab), Hosp Order     Status: None   Collection Time: 05/07/19  5:32 PM  Result Value Ref Range Status   SARS Coronavirus 2 NEGATIVE NEGATIVE Final    Comment: (NOTE) If result is NEGATIVE SARS-CoV-2 target nucleic acids are NOT DETECTED. The SARS-CoV-2 RNA is generally detectable in upper and lower  respiratory specimens  during the acute phase of infection. The lowest    concentration of SARS-CoV-2 viral copies this assay can detect is 250  copies / mL. A negative result does not preclude SARS-CoV-2 infection  and should not be used as the sole basis for treatment or other  patient management decisions.  A negative result may occur with  improper specimen collection / handling, submission of specimen other  than nasopharyngeal swab, presence of viral mutation(s) within the  areas targeted by this assay, and inadequate number of viral copies  (<250 copies / mL). A negative result must be combined with clinical  observations, patient history, and epidemiological information. If result is POSITIVE SARS-CoV-2 target nucleic acids are DETECTED. The SARS-CoV-2 RNA is generally detectable in upper and lower  respiratory specimens dur ing the acute phase of infection.  Positive  results are indicative of active infection with SARS-CoV-2.  Clinical  correlation with patient history and other diagnostic information is  necessary to determine patient infection status.  Positive results do  not rule out bacterial infection or co-infection with other viruses. If result is PRESUMPTIVE POSTIVE SARS-CoV-2 nucleic acids MAY BE PRESENT.   A presumptive positive result was obtained on the submitted specimen  and confirmed on repeat testing.  While 2019 novel coronavirus  (SARS-CoV-2) nucleic acids may be present in the submitted sample  additional confirmatory testing may be necessary for epidemiological  and / or clinical management purposes  to differentiate between  SARS-CoV-2 and other Sarbecovirus currently known to infect humans.  If clinically indicated additional testing with an alternate test  methodology [phone]) is advised. The SARS-CoV-2 RNA is generally  detectable in upper and lower respiratory sp ecimens during the acute  phase of infection. The expected result is Negative. Fact Sheet for Patients:  StrictlyIdeas.no Fact Sheet  for Healthcare Providers: BankingDealers.co.za This test is not yet approved or cleared by the [place] FDA and has been authorized for detection and/or diagnosis of SARS-CoV-2 by FDA under an Emergency Use Authorization (EUA).  This EUA will remain in effect (meaning this test can be used) for the duration of the COVID-19 declaration under Section 564(b)(1) of the Act, 21 U.S.C. section 360bbb-3(b)(1), unless the authorization is terminated or revoked sooner. Performed at [facility], [place] [address], [place], [place] [address]   Urine culture     Status: None   Collection Time: 05/07/19  7:59 PM  Result Value Ref Range Status   Specimen Description   Final    URINE, RANDOM Performed at [facility] [address] [place], [place] [address]    Special Requests   Final    NONE Performed at [facility], [place] [address] [place], [place] [address]    Culture   Final    NO GROWTH Performed at [facility], [place] [address] [place], [place] [address]    Report Status 05/09/2019 FINAL  Final  MRSA PCR Screening     Status: None   Collection Time: 05/07/19 11:17 PM  Result Value Ref Range Status   MRSA by PCR NEGATIVE NEGATIVE Final    Comment:        The GeneXpert MRSA Assay (FDA approved for NASAL specimens only), is one component of a comprehensive MRSA colonization surveillance program. It is not intended to diagnose MRSA infection nor to guide or monitor treatment for MRSA infections. Performed at [facility], [place] [address] [place],  [address]  Radiology Studies: Ct Head W & Wo Contrast  Result Date: 05/10/2019 CLINICAL DATA:  Small cell lung cancer. Confusion and hallucinations recently. EXAM: CT HEAD WITHOUT AND WITH CONTRAST TECHNIQUE: Contiguous axial images were obtained from the base of the skull through the vertex without and with  intravenous contrast CONTRAST:  46m OMNIPAQUE IOHEXOL 300 MG/ML  SOLN COMPARISON:  MRI 02/09/2018 FINDINGS: Brain: The brainstem and cerebellum are unremarkable. There are mild chronic small-vessel ischemic changes of the cerebral hemispheric white matter. Old lacunar infarction in the right basal ganglia/internal capsule. No sign of recent infarction, mass lesion, hemorrhage, hydrocephalus or extra-axial collection. No abnormal enhancement occurs. Vascular: There is atherosclerotic calcification of the major vessels at the base of the brain. Skull: Negative Sinuses/Orbits: Clear/normal Other: None IMPRESSION: No evidence of metastatic disease. No evidence of acute or subacute infarction by CT. Background pattern of chronic small vessel ischemic change. Old lacunar infarction right basal ganglia/internal capsule. Electronically Signed   By: MNelson ChimesM.D.   On: 05/10/2019 14:00      Scheduled Meds:  amLODipine  5 mg Oral Daily   Chlorhexidine Gluconate Cloth  6 each Topical Daily   enoxaparin (LOVENOX) injection  40 mg Subcutaneous Q24H   estradiol  2 mg Oral Daily   FLUoxetine  40 mg Oral Daily   levothyroxine  50 mcg Oral Q0600   magnesium oxide  400 mg Oral BID   mouth rinse  15 mL Mouth Rinse BID   metoprolol succinate  25 mg Oral Daily   pantoprazole  40 mg Oral Daily   Continuous Infusions:  sodium chloride 50 mL/hr at 05/11/19 0803     LOS: 4 days    Time spent: 25 minutes   JDessa Phi DO [facility] [email or web address] 05/11/2019, 10:51 AM

## 2019-05-11 NOTE — Progress Notes (Signed)
Brief Oncology Note:   I visited with the patient today. She remains confused. Reviewed bronchoscopy report. Radiation Oncology plans to see her today to discuss palliative radiation to her left lung mass. Discussed CT of the head results. No evidence of metastatic disease noted.   Oncology will follow periodically. Please call if questions arise in the interim.   Mikey Bussing, DNP, AGPCNP-BC, AOCNP

## 2019-05-11 NOTE — Consult Note (Signed)
Radiation Oncology         [phone] ________________________________  Name: Rebekah Henderson        MRN: [id number]  Date of Service: 05/11/2019 DOB: 02/23/1956  EU:MPNTI, Purcell Nails, MD  No ref. provider found     REFERRING PHYSICIAN: No ref. provider found   DIAGNOSIS: Small Cell Lung Cancer   HISTORY OF PRESENT ILLNESS: Rebekah Henderson is a 63 y.o. female with a history of  small cell lung cancer. The patient was found to have symptoms of pneumonia and had been treated with two courses of antibiotics without improvment. She underwent a CT scan on 09/02/16 revealing a left hilar mass 2.6 x 2.9 cm (with a 4.3 cm cranio-caudal view in the [facility] window). This is associated with mass effect upon the left main pulmonary artery and left mainstem bronchus but does not appear to be arising from the bronchus. It is contiguous with the esophagus with no fat plane between the esophagus and the mass. There is a mass in the central aspect of the left lower lobe contiguous with the left hilum could represent a mass or adenopathy measuring 2.3 x 2.0 cm. 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 She went on to receive concurrent chemoRT between 10/13/16-01/20/17, and then went on to complete  PCI 25 Gy from 04/16/17-04/30/17. She continues with Dr. Julien Nordmann and has been on Cisplatin/Irinotecan after she progressed in the fall of 2019 on Tecentriq. She had stable to improved findings on her staging scans on 04/09/2019, however was admitted on 05/07/2019 due to shortness of breath and confusion. She has been diagnosed with a UTI, but CT of the chest showed increase in her left hilar and mediastinal disease with increased mass effect on the left mainstem bronchus, and increased ground glass opacity in the right upper lobe. She was diagnosed with a UTI and a CT head yesterday was negative for acute or malignant findings. She is evaluated to consider palliative radiotherapy to the  left lung due to her mediastinal and hilar disease. She did have bronchoscopy yesterday that confirmed near occlusion and visible endobronchial tumor.    PREVIOUS RADIATION THERAPY: Yes   04/16/17-04/30/17: Prophylactic Cranial Irradiation 25 Gy in 10 fractions with Dr. Tammi Klippel in the [place] clinic.  10/13/16-01/20/17:  Concurrent chemoRT to the lung and mediastinal disease 66 Gy total with Dr. Tammi Klippel in the [place] clinic.    PAST MEDICAL HISTORY:  Past Medical History:  Diagnosis Date   Antineoplastic chemotherapy induced anemia 12/03/2016   Anxiety    takes Prozac daily   Arthritis    Benign fundic gland polyps of stomach    Colon polyps    COPD (chronic obstructive pulmonary disease) ([place])    Dehydration 03/06/2017   Diverticulitis    Dyspnea    with exertion   Encounter for antineoplastic chemotherapy 12/03/2016   Fibromyalgia    GERD (gastroesophageal reflux disease)    takes Pantoprazole daily   Hypertension    takes Metoprolol,Triamterene-HCTZ,and Amlodipine daily   Hypothyroidism    takes Synthroid daily   IBS (irritable bowel syndrome)    lung ca dx'd 10/02/2016   skin, lung   PONV (postoperative nausea and vomiting)    pt also states that she had some difficulty breathing after cervical fusion       PAST SURGICAL HISTORY: Past Surgical History:  Procedure Laterality Date   BIOPSY N/A 05/25/2013   Procedure: BIOPSIES (Random Colon; Duodenal; Gastric);  Surgeon: Danie Binder, MD;  Location: [facility];  Service: Endoscopy;  Laterality: N/A;   BLADDER SUSPENSION     BREAST ENHANCEMENT SURGERY     BREAST IMPLANT REMOVAL     CERVICAL FUSION  AUG 2013   CHOLECYSTECTOMY  1999   COLONOSCOPY  2007 [place]   POLYPS   COLONOSCOPY WITH PROPOFOL N/A 05/25/2013   Procedure: COLONOSCOPY WITH PROPOFOL(at cecum 0957) total withdrawal time=46min);  Surgeon: Danie Binder, MD;  Location: [facility];  Service: Endoscopy;  Laterality: N/A;    ESOPHAGOGASTRODUODENOSCOPY (EGD) WITH PROPOFOL N/A 05/25/2013   Procedure: ESOPHAGOGASTRODUODENOSCOPY (EGD) WITH PROPOFOL;  Surgeon: Danie Binder, MD;  Location: [facility];  Service: Endoscopy;  Laterality: N/A;   FOOT SURGERY     IR FLUORO GUIDE PORT INSERTION RIGHT  03/04/2018   IR US GUIDE VASC ACCESS RIGHT  03/04/2018   POLYPECTOMY N/A 05/25/2013   Procedure: POLYPECTOMY (Rectal and Gastric);  Surgeon: Danie Binder, MD;  Location: [facility];  Service: Endoscopy;  Laterality: N/A;   TONSILLECTOMY     UPPER GASTROINTESTINAL ENDOSCOPY     VIDEO BRONCHOSCOPY WITH ENDOBRONCHIAL ULTRASOUND  09/12/2016   Procedure: VIDEO BRONCHOSCOPY WITH ENDOBRONCHIAL ULTRASOUND AND BIOPSY;  Surgeon: Juanito Doom, MD;  Location: [facility];  Service: Cardiopulmonary;;     FAMILY HISTORY:  Family History  Problem Relation Age of Onset   Breast cancer Mother    Diabetes Maternal Grandfather    Lung cancer Father    Heart failure Sister 54       Died. Morbidly obese   Colon cancer Neg Hx    Colon polyps Neg Hx      SOCIAL HISTORY:  reports that she quit smoking about 14 years ago. She has a 40.00 pack-year smoking history. She has never used smokeless tobacco. She reports that she does not drink alcohol or use drugs. The patient is single and was living independently until this hospitalization. Her friend Naomee Nowland is her HCPOA.   ALLERGIES: Penicillins; Codeine; Fentanyl; Ranitidine hcl; Keflex [cephalexin]; and Lyrica [pregabalin]   MEDICATIONS:  Current Facility-Administered Medications  Medication Dose Route Frequency Provider Last Rate Last Dose   0.9 %  sodium chloride infusion   Intravenous Continuous Dessa Phi, DO 50 mL/hr at 05/11/19 0803     acetaminophen (TYLENOL) tablet 650 mg  650 mg Oral Q6H PRN Shela Leff, MD       Or   acetaminophen (TYLENOL) suppository 650 mg  650 mg Rectal Q6H PRN Shela Leff, MD       ALPRAZolam Duanne Moron) tablet 0.25-0.5 mg   0.25-0.5 mg Oral Daily PRN Shela Leff, MD   0.5 mg at 05/11/19 1001   alum & mag hydroxide-simeth (MAALOX/MYLANTA) 200-200-20 MG/5ML suspension 30 mL  30 mL Oral Q6H PRN Shela Leff, MD       And   lidocaine (XYLOCAINE) 2 % viscous mouth solution 15 mL  15 mL Oral Q6H PRN Shela Leff, MD       amLODipine (NORVASC) tablet 5 mg  5 mg Oral Daily Shela Leff, MD   5 mg at 05/11/19 1001   Chlorhexidine Gluconate Cloth 2 % PADS 6 each  6 each Topical Daily Shela Leff, MD   6 each at 05/11/19 1002   dicyclomine (BENTYL) tablet 20 mg  20 mg Oral TID PRN Shela Leff, MD       enoxaparin (LOVENOX) injection 40 mg  40 mg Subcutaneous Q24H Chesley Mires, MD   40 mg at 05/11/19 1001  estradiol (ESTRACE) tablet 2 mg  2 mg Oral Daily Shela Leff, MD   2 mg at 05/11/19 1001   FLUoxetine (PROZAC) capsule 40 mg  40 mg Oral Daily Shela Leff, MD   40 mg at 05/11/19 1001   HYDROcodone-acetaminophen (NORCO/VICODIN) 5-325 MG per tablet 1-2 tablet  1-2 tablet Oral Q6H PRN Shela Leff, MD   1 tablet at 05/08/19 0829   ipratropium-albuterol (DUONEB) 0.5-2.5 (3) MG/3ML nebulizer solution 3 mL  3 mL Nebulization Q6H PRN Shela Leff, MD       levothyroxine (SYNTHROID) tablet 50 mcg  50 mcg Oral Q0600 Shela Leff, MD   50 mcg at 05/10/19 0601   loperamide (IMODIUM) capsule 2 mg  2 mg Oral Q2H PRN Shela Leff, MD   2 mg at 05/10/19 0920   magnesium oxide (MAG-OX) tablet 400 mg  400 mg Oral BID Shela Leff, MD   400 mg at 05/11/19 1001   MEDLINE mouth rinse  15 mL Mouth Rinse BID Dessa Phi, DO   15 mL at 05/11/19 1002   metoprolol succinate (TOPROL-XL) 24 hr tablet 25 mg  25 mg Oral Daily Shela Leff, MD   25 mg at 05/11/19 1001   morphine 2 MG/ML injection 1 mg  1 mg Intravenous Q3H PRN Shela Leff, MD   1 mg at 05/08/19 2004   pantoprazole (PROTONIX) EC tablet 40 mg  40 mg Oral Daily Shela Leff, MD   40 mg at 05/11/19 1001   promethazine (PHENERGAN) injection 12.5 mg  12.5 mg Intravenous Q6H PRN Shela Leff, MD   12.5 mg at 05/08/19 2021   Facility-Administered Medications Ordered in Other Encounters  Medication Dose Route Frequency Provider Last Rate Last Dose   0.9 %  sodium chloride infusion   Intravenous Continuous Curt Bears, MD   Stopped at 04/27/19 1210   sodium chloride flush (NS) 0.9 % injection 10 mL  10 mL Intracatheter PRN Curt Bears, MD   10 mL at 03/16/19 1001   sodium chloride flush (NS) 0.9 % injection 10 mL  10 mL Intracatheter PRN Curt Bears, MD   10 mL at 04/27/19 1212     REVIEW OF SYSTEMS: Unable to assess due to confusion.      PHYSICAL EXAM:  Wt Readings from Last 3 Encounters:  05/07/19 149 lb 14.6 oz (68 kg)  05/03/19 150 lb (68 kg)  04/27/19 154 lb 4 oz (70 kg)   Temp Readings from Last 3 Encounters:  05/11/19 98.4 F (36.9 C) (Oral)  05/05/19 97.9 F (36.6 C)  04/27/19 98.3 F (36.8 C) (Tympanic)   BP Readings from Last 3 Encounters:  05/11/19 (!) 164/74  05/05/19 111/64  04/27/19 (!) 115/57   Pulse Readings from Last 3 Encounters:  05/11/19 96  05/05/19 80  04/27/19 69   Pain Assessment Pain Score: 0-No pain/10  Unable to assess due to nature of visit.    ECOG = 3  0 - Asymptomatic (Fully active, able to carry on all predisease activities without restriction)  1 - Symptomatic but completely ambulatory (Restricted in physically strenuous activity but ambulatory and able to carry out work of a light or sedentary nature. For example, light housework, office work)  2 - Symptomatic, <50% in bed during the day (Ambulatory and capable of all self care but unable to carry out any work activities. Up and about more than 50% of waking hours)  3 - Symptomatic, >50% in bed, but not bedbound (Capable of only limited self-care,  confined to bed or chair 50% or more of waking hours)  4 - Bedbound  (Completely disabled. Cannot carry on any self-care. Totally confined to bed or chair)  5 - Death   Eustace Pen MM, Creech RH, Tormey DC, et al. [phone]). "Toxicity and response criteria of the [facility]". [place] Oncol. 5 (6): 649-55    LABORATORY DATA:  Lab Results  Component Value Date   WBC 6.1 05/11/2019   HGB 9.2 (L) 05/11/2019   HCT 28.5 (L) 05/11/2019   MCV 111.8 (H) 05/11/2019   PLT 152 05/11/2019   Lab Results  Component Value Date   NA 141 05/11/2019   K 3.6 05/11/2019   CL 106 05/11/2019   CO2 23 05/11/2019   Lab Results  Component Value Date   ALT 9 05/07/2019   AST 15 05/07/2019   ALKPHOS 39 05/07/2019   BILITOT 0.5 05/07/2019      RADIOGRAPHY: Ct Head W & Wo Contrast  Result Date: 05/10/2019 CLINICAL DATA:  Small cell lung cancer. Confusion and hallucinations recently. EXAM: CT HEAD WITHOUT AND WITH CONTRAST TECHNIQUE: Contiguous axial images were obtained from the base of the skull through the vertex without and with intravenous contrast CONTRAST:  63mL OMNIPAQUE IOHEXOL 300 MG/ML  SOLN COMPARISON:  MRI 02/09/2018 FINDINGS: Brain: The brainstem and cerebellum are unremarkable. There are mild chronic small-vessel ischemic changes of the cerebral hemispheric white matter. Old lacunar infarction in the right basal ganglia/internal capsule. No sign of recent infarction, mass lesion, hemorrhage, hydrocephalus or extra-axial collection. No abnormal enhancement occurs. Vascular: There is atherosclerotic calcification of the major vessels at the base of the brain. Skull: Negative Sinuses/Orbits: Clear/normal Other: None IMPRESSION: No evidence of metastatic disease. No evidence of acute or subacute infarction by CT. Background pattern of chronic small vessel ischemic change. Old lacunar infarction right basal ganglia/internal capsule. Electronically Signed   By: Nelson Chimes M.D.   On: 05/10/2019 14:00   Ct Chest W Contrast  Result Date:  05/07/2019 CLINICAL DATA:  Lung cancer, UTI, chest pain, abdominal pain, vomiting EXAM: CT CHEST, ABDOMEN, AND PELVIS WITH CONTRAST TECHNIQUE: Multidetector CT imaging of the chest, abdomen and pelvis was performed following the standard protocol during bolus administration of intravenous contrast. CONTRAST:  178mL OMNIPAQUE IOHEXOL 300 MG/ML  SOLN COMPARISON:  Same day chest radiograph, CT chest abdomen pelvis, 04/09/2019 FINDINGS: CT CHEST FINDINGS Cardiovascular: No significant vascular findings. Normal heart size. No pericardial effusion. Right chest port catheter. Mediastinum/Nodes: Leftward shift of the mediastinum. Slight interval increase in size of a left hilar and mediastinal mass about the left mainstem bronchus, with increased mass effect on the left mainstem bronchus. Enlarged subcarinal lymph node and involvement of the midportion of the esophagus by mass. Normal thyroid. Small hiatal hernia. Lungs/Pleura: Near total atelectasis of the left lung secondary to left mainstem bronchial obstruction with a small pleural effusion. Slightly increased ground-glass opacity of the right upper lobe (series 4, image 50). Mild emphysema. Musculoskeletal: No chest wall mass. CT ABDOMEN PELVIS FINDINGS Hepatobiliary: No focal liver abnormality is seen. Status post cholecystectomy with postoperative biliary ductal dilatation. Pancreas: Unremarkable. No pancreatic ductal dilatation or surrounding inflammatory changes. Spleen: Normal in size without significant abnormality. Adrenals/Urinary Tract: Adrenal glands are unremarkable. Kidneys are normal, without renal calculi, solid lesion, or hydronephrosis. Bladder is unremarkable. Stomach/Bowel: Stomach is within normal limits. Appendix appears normal. No evidence of bowel wall thickening, distention, or inflammatory changes. Sigmoid diverticulosis. Vascular/Lymphatic: Calcific atherosclerosis. No enlarged abdominal or pelvic  lymph nodes. Reproductive: Status post  hysterectomy. Other: No abdominal wall hernia or abnormality. No abdominopelvic ascites. Musculoskeletal: Unchanged sclerotic lesion of the L3 vertebral body. IMPRESSION: 1. Slight interval increase in size of a left hilar and mediastinal mass about the left mainstem bronchus, with increased mass effect on the left mainstem bronchus. Near total atelectasis of the left lung secondary to left mainstem bronchial obstruction with a small pleural effusion. 2. Slightly increased infectious or inflammatory ground-glass opacity of the right upper lobe (series 4, image 50). Mild emphysema. 3.  Unchanged sclerotic lesion of the L3 vertebral body. 4. Other chronic, incidental, and postoperative findings as detailed above. Electronically Signed   By: Eddie Candle M.D.   On: 05/07/2019 19:53   Ct Abdomen Pelvis W Contrast  Result Date: 05/07/2019 CLINICAL DATA:  Lung cancer, UTI, chest pain, abdominal pain, vomiting EXAM: CT CHEST, ABDOMEN, AND PELVIS WITH CONTRAST TECHNIQUE: Multidetector CT imaging of the chest, abdomen and pelvis was performed following the standard protocol during bolus administration of intravenous contrast. CONTRAST:  168mL OMNIPAQUE IOHEXOL 300 MG/ML  SOLN COMPARISON:  Same day chest radiograph, CT chest abdomen pelvis, 04/09/2019 FINDINGS: CT CHEST FINDINGS Cardiovascular: No significant vascular findings. Normal heart size. No pericardial effusion. Right chest port catheter. Mediastinum/Nodes: Leftward shift of the mediastinum. Slight interval increase in size of a left hilar and mediastinal mass about the left mainstem bronchus, with increased mass effect on the left mainstem bronchus. Enlarged subcarinal lymph node and involvement of the midportion of the esophagus by mass. Normal thyroid. Small hiatal hernia. Lungs/Pleura: Near total atelectasis of the left lung secondary to left mainstem bronchial obstruction with a small pleural effusion. Slightly increased ground-glass opacity of the right  upper lobe (series 4, image 50). Mild emphysema. Musculoskeletal: No chest wall mass. CT ABDOMEN PELVIS FINDINGS Hepatobiliary: No focal liver abnormality is seen. Status post cholecystectomy with postoperative biliary ductal dilatation. Pancreas: Unremarkable. No pancreatic ductal dilatation or surrounding inflammatory changes. Spleen: Normal in size without significant abnormality. Adrenals/Urinary Tract: Adrenal glands are unremarkable. Kidneys are normal, without renal calculi, solid lesion, or hydronephrosis. Bladder is unremarkable. Stomach/Bowel: Stomach is within normal limits. Appendix appears normal. No evidence of bowel wall thickening, distention, or inflammatory changes. Sigmoid diverticulosis. Vascular/Lymphatic: Calcific atherosclerosis. No enlarged abdominal or pelvic lymph nodes. Reproductive: Status post hysterectomy. Other: No abdominal wall hernia or abnormality. No abdominopelvic ascites. Musculoskeletal: Unchanged sclerotic lesion of the L3 vertebral body. IMPRESSION: 1. Slight interval increase in size of a left hilar and mediastinal mass about the left mainstem bronchus, with increased mass effect on the left mainstem bronchus. Near total atelectasis of the left lung secondary to left mainstem bronchial obstruction with a small pleural effusion. 2. Slightly increased infectious or inflammatory ground-glass opacity of the right upper lobe (series 4, image 50). Mild emphysema. 3.  Unchanged sclerotic lesion of the L3 vertebral body. 4. Other chronic, incidental, and postoperative findings as detailed above. Electronically Signed   By: Eddie Candle M.D.   On: 05/07/2019 19:53   Dg Chest Port 1 View  Result Date: 05/07/2019 CLINICAL DATA:  UTI, lung cancer, emesis EXAM: PORTABLE CHEST 1 VIEW COMPARISON:  04/03/2019 FINDINGS: There is total opacification of the left hemithorax. The cardiac and mediastinal borders are obscured. Right chest port catheter. IMPRESSION: There is total opacification  of the left hemithorax, concerning for large left pleural effusion and/or left mainstem bronchial obstruction by hilar mass. Recommend CT to further evaluate. Electronically Signed   By: Dorna Bloom.D.  On: 05/07/2019 17:31       IMPRESSION/PLAN: 1. Progressive Limited Stage Small Cell Carcinoma of the left lung and mediastinum/hilum. Dr. Tammi Klippel has reviewed the patient's imaging and case to date. He recommends a course of palliative radiotherpay to reduce the risk of further obstruction of her left main bronchus. He would offer 30 Gy in 10 fractions with plans to simulate and treat today. With the patient's inability to discuss her care, I've reached out to Drue Dun, her HCPOA an close friend. She has given verbal consent to proceed with simulation and treatment following our discussion of risks, benefits, short, and long term effects of radiotherapy. She will come to the department this morning for planning and begin treatment in the 4 o'clock hour.  2. Confusion. The patient is being treated for UTI. Her head CT was negative, but if her symptoms persist, MRI may be a consideration with anesthesia.   In a visit lasting 70 minutes, greater than 50% of the time was spent in floor time, and coordinating the patient's care.     Carola Rhine, PAC

## 2019-05-11 NOTE — Progress Notes (Signed)
[facility] Therapy Treatment Record Phone [phone]   Radiation Therapy was administered to Rebekah Henderson on: 05/11/2019  4:02 PM and was treatment # 1 out of a planned course of 10 treatments.  Radiation Treatment  1). Beam photons with 6-10 energy  2). Brachytherapy None  3). Stereotactic Radiosurgery None  4). Other Radiation None     [name] A Cicely Ortner, RT (T)

## 2019-05-11 NOTE — Progress Notes (Signed)
Spoke with Apolonio Schneiders, RN in ICU caring for patient today. Explained rad onc transportation team plans to be up to bring patient down at 1115 for simulation in preparation for radiation treatment. Apolonio Schneiders, RN reports the patient is in restraints and she will plan to accompany the patient. Also, informed her of the patient's planned treatment for 1600. Apolonio Schneiders, RN verbalized understanding of all reviewed. [facility] staff informed of all findings.

## 2019-05-12 ENCOUNTER — Ambulatory Visit
Admit: 2019-05-12 | Discharge: 2019-05-12 | Disposition: A | Discharge status: Home / Self Care | Payer: Medicare Other | Attending: Radiation Oncology | Admitting: Radiation Oncology

## 2019-05-12 LAB — BASIC METABOLIC PANEL
Anion gap: 11 (ref 5–15)
BUN: 16 mg/dL (ref 8–23)
CO2: 22 mmol/L (ref 22–32)
Calcium: 8.5 mg/dL — ABNORMAL LOW (ref 8.9–10.3)
Chloride: 108 mmol/L (ref 98–111)
Creatinine, Ser: 1.2 mg/dL — ABNORMAL HIGH (ref 0.44–1.00)
GFR calc Af Amer: 55 mL/min — ABNORMAL LOW (ref >60)
GFR calc non Af Amer: 47 mL/min — ABNORMAL LOW (ref >60)
Glucose, Bld: 81 mg/dL (ref 70–99)
Potassium: 3.4 mmol/L — ABNORMAL LOW (ref 3.5–5.1)
Sodium: 141 mmol/L (ref 135–145)

## 2019-05-12 LAB — CBC
HCT: 24.7 % — ABNORMAL LOW (ref 36.0–46.0)
Hemoglobin: 8 g/dL — ABNORMAL LOW (ref 12.0–15.0)
MCH: 35.9 pg — ABNORMAL HIGH (ref 26.0–34.0)
MCHC: 32.4 g/dL (ref 30.0–36.0)
MCV: 110.8 fL — ABNORMAL HIGH (ref 80.0–100.0)
Platelets: 131 10*3/uL — ABNORMAL LOW (ref 150–400)
RBC: 2.23 MIL/uL — ABNORMAL LOW (ref 3.87–5.11)
RDW: 15 % (ref 11.5–15.5)
WBC: 6.2 10*3/uL (ref 4.0–10.5)
nRBC: 0 % (ref 0.0–0.2)

## 2019-05-12 MED ORDER — HALOPERIDOL LACTATE 5 MG/ML IJ SOLN
1.0000 mg | Freq: Four times a day (QID) | INTRAMUSCULAR | Status: DC | PRN
  Administered 2019-05-12 – 2019-05-16 (×9): 1 mg via INTRAVENOUS
  Filled 2019-05-12 (×9): qty 1

## 2019-05-12 MED ORDER — QUETIAPINE FUMARATE 25 MG PO TABS
25.0000 mg | ORAL_TABLET | Freq: Every day | ORAL | Status: DC
  Administered 2019-05-12 – 2019-05-15 (×4): 25 mg via ORAL
  Filled 2019-05-12 (×5): qty 1

## 2019-05-12 MED ORDER — POTASSIUM CHLORIDE CRYS ER 20 MEQ PO TBCR
40.0000 meq | EXTENDED_RELEASE_TABLET | Freq: Once | ORAL | Status: AC
  Administered 2019-05-12: 12:00:00 40 meq via ORAL
  Filled 2019-05-12: qty 2

## 2019-05-12 NOTE — Evaluation (Signed)
Physical Therapy Evaluation Patient Details Name: Rebekah Henderson MRN: [id number] DOB: 11-Feb-1956 Today's Date: 05/12/2019   History of Present Illness  63 yo female admitted to ED on 6/5 with N/V, UTI, lung cancer with lung collapse secondary to mainstem bronchial obstruction from lung mass in setting of small cell carcinoma. Pt with delirium as well. PMH includes lung cancer with history of and ongoing chemo/radiation, anxiety, OA, COPD, diverticulitis, fibromyalgia, GERD, HTN, IBS, PNA, cervical fusion 2013.  Clinical Impression   Pt presents with generalized weakness, difficulty performing bed mobility, poor sitting balance, decreased activity tolerance, back/knee pain, and impaired cognition. Pt to benefit from acute PT to address deficits. Pt requires mod-max assist +2 for bed level activity, and is very anxious with mobility transitions. Per pt's report, unsure of accuracy given cognitive status, pt states she lives alone with her dog (who has been locked in her house for 3 days). PT recommending SNF to address pt impairments.  PT to progress mobility as tolerated, and will continue to follow acutely.      Follow Up Recommendations SNF;Supervision/Assistance - 24 hour    Equipment Recommendations  None recommended by PT    Recommendations for Other Services       Precautions / Restrictions Precautions Precautions: Fall Restrictions Weight Bearing Restrictions: No      Mobility  Bed Mobility Overal bed mobility: Needs Assistance Bed Mobility: Supine to Sit;Sit to Supine;Rolling Rolling: Mod assist;+2 for safety/equipment   Supine to sit: Max assist;HOB elevated Sit to supine: Max assist;+2 for physical assistance;+2 for safety/equipment   General bed mobility comments: Mod assist for rolling bilaterally when changing pt sheets, use of bed pads and pt use of rails to perform. Max assist for supine<>sit for LE management, trunk elevation/lowering, positioning in bed. Pt sat EOB  for several minutes as intervention.  Transfers Overall transfer level: (fatigue from sitting EOB)                  Ambulation/Gait                Stairs            Wheelchair Mobility    Modified Rankin (Stroke Patients Only)       Balance Overall balance assessment: Needs assistance Sitting-balance support: Bilateral upper extremity supported;Feet supported Sitting balance-Leahy Scale: Poor Sitting balance - Comments: very brief periods of pt sitting unsupported, otherwise requires min assist to maintain sitting balance. Pt with posterior and L lateral leaning, responds well to verbal and tactile cuing to correct. Pt sat EOB ~10 minutes, performing LE exercise and focus of session was sitting EOB with upright posture/endurance for sitting.  Postural control: Posterior lean;Left lateral lean Standing balance support: (NT)                                 Pertinent Vitals/Pain Pain Assessment: Faces Faces Pain Scale: Hurts even more Pain Location: back, knees, neck Pain Descriptors / Indicators: Sore;Discomfort Pain Intervention(s): Monitored during session;Repositioned;Limited activity within patient's tolerance    Home Living Family/patient expects to be discharged to:: Private residence Living Arrangements: Alone   Type of Home: House Home Access: Stairs to enter Entrance Stairs-Rails: Chemical engineer of Steps: 3 or 4  Home Layout: One level Home Equipment: Environmental consultant - 2 wheels;Cane - single point;Shower seat      Prior Function Level of Independence: Independent  Comments: All from pt's report, unsure of reliability of pt report given pt's confusion.     Hand Dominance   Dominant Hand: Right    Extremity/Trunk Assessment   Upper Extremity Assessment Upper Extremity Assessment: Generalized weakness    Lower Extremity Assessment Lower Extremity Assessment: Generalized weakness    Cervical /  Trunk Assessment Cervical / Trunk Assessment: Kyphotic  Communication   Communication: No difficulties  Cognition Arousal/Alertness: Awake/alert Behavior During Therapy: Anxious;Restless Overall Cognitive Status: Impaired/Different from baseline Area of Impairment: Orientation;Attention;Memory;Following commands;Safety/judgement;Problem solving                 Orientation Level: Disoriented to;Person;Place;Situation;Time(Pt states her name is not Shianne, "that's a flower"; thinks she is in a restaurant in [place] and pulled out debit card to pay the hostess) Current Attention Level: Focused Memory: Decreased short-term memory Following Commands: Follows one step commands inconsistently Safety/Judgement: Decreased awareness of safety;Decreased awareness of deficits   Problem Solving: Slow processing;Decreased initiation;Difficulty sequencing;Requires verbal cues;Requires tactile cues General Comments: Pt talking to "a girl that's on the other side of the room", states she is talking to machines in the room as well.       General Comments General comments (skin integrity, edema, etc.): Pt on 2LO2 via [place] upon PT arrival to room. Pt sats 84-85% on 2L, pt placed on 3LO2 to increase sats. Sats reached 88-89%, RN notified.    Exercises General Exercises - Lower Extremity Long Arc Quad: Both;5 reps;Seated;AAROM Heel Slides: Both;AAROM;5 reps;Supine   Assessment/Plan    PT Assessment Patient needs continued PT services  PT Problem List Decreased strength;Decreased mobility;Decreased safety awareness;Decreased range of motion;Decreased activity tolerance;Decreased cognition;Decreased balance;Pain;Decreased knowledge of use of DME       PT Treatment Interventions DME instruction;Functional mobility training;Balance training;Patient/family education;Gait training;Therapeutic activities;Therapeutic exercise    PT Goals (Current goals can be found in the Care Plan section)  Acute  Rehab PT Goals Patient Stated Goal: check on my dog  PT Goal Formulation: With patient Time For Goal Achievement: 05/19/19 Potential to Achieve Goals: Good    Frequency Min 2X/week   Barriers to discharge Decreased caregiver support      Co-evaluation               AM-PAC PT "6 Clicks" Mobility  Outcome Measure Help needed turning from your back to your side while in a flat bed without using bedrails?: A Lot Help needed moving from lying on your back to sitting on the side of a flat bed without using bedrails?: A Lot Help needed moving to and from a bed to a chair (including a wheelchair)?: Total Help needed standing up from a chair using your arms (e.g., wheelchair or bedside chair)?: Total Help needed to walk in hospital room?: Total Help needed climbing 3-5 steps with a railing? : Total 6 Click Score: 8    End of Session Equipment Utilized During Treatment: Oxygen Activity Tolerance: Patient limited by fatigue;Patient limited by pain Patient left: in bed;with call bell/phone within reach;with bed alarm set Nurse Communication: Mobility status PT Visit Diagnosis: Muscle weakness (generalized) (M62.81);Other abnormalities of gait and mobility (R26.89)    Time: 3825-0539 PT Time Calculation (min) (ACUTE ONLY): 24 min   Charges:   PT Evaluation $PT Eval Low Complexity: 1 Low PT Treatments $Therapeutic Activity: 8-22 mins       Julien Girt, PT Acute Rehabilitation Services Pager [phone]  Office [phone]   Roxine Caddy D Elonda Husky 05/12/2019, 7:37 PM

## 2019-05-12 NOTE — Progress Notes (Addendum)
PROGRESS NOTE    Rebekah Henderson  XFG:[id number] DOB: Feb 17, 1956 DOA: 05/07/2019 PCP: Redmond School, MD   Brief Narrative:  Rebekah Henderson a 63 y.o.femalewith medical history significant ofsmall cell lung cancer on chemo, COPD, GERD, hypertension, hypothyroidism, IBS, and conditions listed below presenting to the hospital for evaluation of emesis. Patient was recently admitted on June 1 for ESBL UTI/pyelonephritis and treated with meropenem. She was converted to ertapenem on June 3 and discharged home for ongoing dosages until June 8.  Patient states she has been receiving chemotherapy for several years and has had intermittent nauseaand vomitingfor several years as well. States today she vomited after drinking ginger ale. She is also been having left lower quadrant abdominal pain for several years. Also reports having intermittent diarrhea for several years, most recent episode 2 days ago. States she has been seen at [facility] and another outside hospital and was told her symptoms were due to IBS, acid reflux, and fibromyalgia. She is also complaining of some pain in her lower sternal and upper abdominal region which she describes as a gurgling sensation. Also reports having acid reflux. States for the past few days she has been having sharp left-sided chest pain. Chest pain is nonexertional. Reports having chronic shortness of breath, no recent change. She uses 2 L home oxygen all the time.  Troponin x3 were checked and negative.  In the ED, work up revealed chest x-ray showed total opacification of the left hemithorax, concerning for large left pleural effusion and/or left mainstem bronchial obstruction by hilar mass. Chest CT showed slight interval increase in size of the left hilar and mediastinal mass about the left mainstem bronchus, with increased mass-effect on the left mainstem bronchus. Near-total atelectasis of the left lung secondary to left mainstem bronchial obstruction  with a small pleural effusion. Also showing slightly increased infectious or inflammatory groundglass opacity of the right upper lobe.  She underwent bronchoscopy on 6/8.  Consultants:   Oncology  Radiation oncology  PCCM  Procedures:   Bronchoscopy on 05/10/2019  Antimicrobials:   None   Subjective: Patient seen and examined.  She is in restraints and with hand mittens.  She is comfortable and pleasant however only slightly confused.  I am seeing her for the first time and based on the chart review, it seems like she was significantly confused up until yesterday.  She tells me today is month, year and current president of the [place] however she failed to tell me her location which [facility].  Objective: Vitals:   05/11/19 1200 05/11/19 1658 05/11/19 2057 05/12/19 0418  BP: (!) 143/81 (!) 151/76 123/68 (!) 149/81  Pulse: 89 82 86 (!) 102  Resp: 18 15 20 20   Temp: 98.4 F (36.9 C) 98.1 F (36.7 C) (!) 97.5 F (36.4 C) 97.6 F (36.4 C)  TempSrc: Oral Oral Oral   SpO2: (!) 83% 96% (!) 81% 97%  Weight:      Height:        Intake/Output Summary (Last 24 hours) at 05/12/2019 0948 Last data filed at 05/12/2019 0930 Gross per 24 hour  Intake 1306.55 ml  Output 1200 ml  Net 106.55 ml   Filed Weights   05/07/19 1643  Weight: 68 kg    Examination:  General exam: Appears calm and comfortable  Respiratory system: Clear to auscultation. Respiratory effort normal. Cardiovascular system: S1 & S2 heard, RRR. No JVD, murmurs, rubs, gallops or clicks. No pedal edema. Gastrointestinal system: Abdomen is nondistended,  soft and nontender. No organomegaly or masses felt. Normal bowel sounds heard. Central nervous system: Alert and oriented x3. No focal neurological deficits. Extremities: Symmetric 5 x 5 power. Skin: No rashes, lesions or ulcers Psychiatry: Judgement and insight appear poor, mood & affect appropriate.    Data Reviewed: I have personally  reviewed following labs and imaging studies  CBC: Recent Labs  Lab 05/07/19 1706 05/09/19 0500 05/10/19 0421 05/11/19 0416 05/12/19 0620  WBC 4.0 4.1 5.9 6.1 6.2  NEUTROABS 2.6  --   --   --   --   HGB 8.7* 9.2* 9.6* 9.2* 8.0*  HCT 25.7* 26.7* 29.4* 28.5* 24.7*  MCV 109.4* 109.4* 112.2* 111.8* 110.8*  PLT 137* 147* 175 152 628*   Basic Metabolic Panel: Recent Labs  Lab 05/07/19 2150 05/08/19 0104 05/09/19 0500 05/10/19 0421 05/11/19 0416 05/12/19 0620  NA  --  135 137 141 141 141  K  --  3.7 3.4* 3.5 3.6 3.4*  CL  --  102 103 105 106 108  CO2  --  24 25 23 23 22   GLUCOSE  --  85 106* 95 94 81  BUN  --  10 8 10 13 16   CREATININE  --  0.92 0.81 0.99 1.44* 1.20*  CALCIUM  --  8.5* 9.2 9.0 8.9 8.5*  MG 1.4*  --   --   --   --   --    GFR: Estimated Creatinine Clearance: 44.3 mL/min (A) (by C-G formula based on SCr of 1.2 mg/dL (H)). Liver Function Tests: Recent Labs  Lab 05/07/19 1706  AST 15  ALT 9  ALKPHOS 39  BILITOT 0.5  PROT 6.5  ALBUMIN 3.8   Recent Labs  Lab 05/07/19 1706  LIPASE 22   No results for input(s): AMMONIA in the last 168 hours. Coagulation Profile: No results for input(s): INR, PROTIME in the last 168 hours. Cardiac Enzymes: Recent Labs  Lab 05/07/19 1706 05/07/19 2150 05/08/19 0051 05/08/19 0830  TROPONINI <0.03 <0.03 <0.03 <0.03   BNP (last 3 results) No results for input(s): PROBNP in the last 8760 hours. HbA1C: No results for input(s): HGBA1C in the last 72 hours. CBG: Recent Labs  Lab 05/09/19 1722  GLUCAP 87   Lipid Profile: No results for input(s): CHOL, HDL, LDLCALC, TRIG, CHOLHDL, LDLDIRECT in the last 72 hours. Thyroid Function Tests: No results for input(s): TSH, T4TOTAL, FREET4, T3FREE, THYROIDAB in the last 72 hours. Anemia Panel: No results for input(s): VITAMINB12, FOLATE, FERRITIN, TIBC, IRON, RETICCTPCT in the last 72 hours. Sepsis Labs: Recent Labs  Lab 05/07/19 2150  PROCALCITON 0.10    Recent  Results (from the past 240 hour(s))  Urine culture     Status: Abnormal   Collection Time: 05/03/19  6:19 PM  Result Value Ref Range Status   Specimen Description   Final    URINE, RANDOM Performed at [facility], [address], [place], [place] [address]    Special Requests   Final    NONE Performed at [facility], [address], [place], [place] [address]    Culture (A)  Final    >=100,000 COLONIES/mL ESCHERICHIA COLI Confirmed Extended Spectrum Beta-Lactamase Producer (ESBL).  In bloodstream infections from ESBL organisms, carbapenems are preferred over piperacillin/tazobactam. They are shown to have a lower risk of mortality.    Report Status 05/05/2019 FINAL  Final   Organism ID, Bacteria ESCHERICHIA COLI (A)  Final      Susceptibility   Escherichia coli -  MIC*    AMPICILLIN >=32 RESISTANT Resistant     CEFAZOLIN >=64 RESISTANT Resistant     CEFTRIAXONE >=64 RESISTANT Resistant     CIPROFLOXACIN >=4 RESISTANT Resistant     GENTAMICIN <=1 SENSITIVE Sensitive     IMIPENEM <=0.25 SENSITIVE Sensitive     NITROFURANTOIN 32 SENSITIVE Sensitive     TRIMETH/SULFA >=320 RESISTANT Resistant     AMPICILLIN/SULBACTAM >=32 RESISTANT Resistant     PIP/TAZO <=4 SENSITIVE Sensitive     Extended ESBL POSITIVE Resistant     * >=100,000 COLONIES/mL ESCHERICHIA COLI  SARS Coronavirus 2 (CEPHEID - Performed in [place] hospital lab), Hosp Order     Status: None   Collection Time: 05/03/19  9:35 PM  Result Value Ref Range Status   SARS Coronavirus 2 NEGATIVE NEGATIVE Final    Comment: (NOTE) If result is NEGATIVE SARS-CoV-2 target nucleic acids are NOT DETECTED. The SARS-CoV-2 RNA is generally detectable in upper and lower  respiratory specimens during the acute phase of infection. The lowest  concentration of SARS-CoV-2 viral copies this assay can detect is 250  copies / mL. A negative result does not preclude SARS-CoV-2 infection  and should not be used as the sole basis for  treatment or other  patient management decisions.  A negative result may occur with  improper specimen collection / handling, submission of specimen other  than nasopharyngeal swab, presence of viral mutation(s) within the  areas targeted by this assay, and inadequate number of viral copies  (<250 copies / mL). A negative result must be combined with clinical  observations, patient history, and epidemiological information. If result is POSITIVE SARS-CoV-2 target nucleic acids are DETECTED. The SARS-CoV-2 RNA is generally detectable in upper and lower  respiratory specimens dur ing the acute phase of infection.  Positive  results are indicative of active infection with SARS-CoV-2.  Clinical  correlation with patient history and other diagnostic information is  necessary to determine patient infection status.  Positive results do  not rule out bacterial infection or co-infection with other viruses. If result is PRESUMPTIVE POSTIVE SARS-CoV-2 nucleic acids MAY BE PRESENT.   A presumptive positive result was obtained on the submitted specimen  and confirmed on repeat testing.  While 2019 novel coronavirus  (SARS-CoV-2) nucleic acids may be present in the submitted sample  additional confirmatory testing may be necessary for epidemiological  and / or clinical management purposes  to differentiate between  SARS-CoV-2 and other Sarbecovirus currently known to infect humans.  If clinically indicated additional testing with an alternate test  methodology [phone]) is advised. The SARS-CoV-2 RNA is generally  detectable in upper and lower respiratory sp ecimens during the acute  phase of infection. The expected result is Negative. Fact Sheet for Patients:  StrictlyIdeas.no Fact Sheet for Healthcare Providers: BankingDealers.co.za This test is not yet approved or cleared by the [place] FDA and has been authorized for detection and/or  diagnosis of SARS-CoV-2 by FDA under an Emergency Use Authorization (EUA).  This EUA will remain in effect (meaning this test can be used) for the duration of the COVID-19 declaration under Section 564(b)(1) of the Act, 21 U.S.C. section 360bbb-3(b)(1), unless the authorization is terminated or revoked sooner. Performed at [facility], [address] [place], [place] [address]   Culture, blood (routine x 2)     Status: None   Collection Time: 05/03/19 11:05 PM  Result Value Ref Range Status   Specimen Description BLOOD RIGHT ARM  Final   Special  Requests   Final    BOTTLES DRAWN AEROBIC AND ANAEROBIC Blood Culture adequate volume   Culture   Final    NO GROWTH 5 DAYS Performed at [facility], [address], [place], [place] [address]    Report Status 05/08/2019 FINAL  Final  Culture, blood (routine x 2)     Status: None   Collection Time: 05/03/19 11:07 PM  Result Value Ref Range Status   Specimen Description BLOOD RIGHT ARM  Final   Special Requests   Final    BOTTLES DRAWN AEROBIC AND ANAEROBIC Blood Culture adequate volume   Culture   Final    NO GROWTH 5 DAYS Performed at [facility], [address] [place], [place] [address]    Report Status 05/08/2019 FINAL  Final  SARS Coronavirus 2 (CEPHEID - Performed in [place] hospital lab), Hosp Order     Status: None   Collection Time: 05/07/19  5:32 PM  Result Value Ref Range Status   SARS Coronavirus 2 NEGATIVE NEGATIVE Final    Comment: (NOTE) If result is NEGATIVE SARS-CoV-2 target nucleic acids are NOT DETECTED. The SARS-CoV-2 RNA is generally detectable in upper and lower  respiratory specimens during the acute phase of infection. The lowest  concentration of SARS-CoV-2 viral copies this assay can detect is 250  copies / mL. A negative result does not preclude SARS-CoV-2 infection  and should not be used as the sole basis for treatment or other  patient management decisions.  A negative result may occur with    improper specimen collection / handling, submission of specimen other  than nasopharyngeal swab, presence of viral mutation(s) within the  areas targeted by this assay, and inadequate number of viral copies  (<250 copies / mL). A negative result must be combined with clinical  observations, patient history, and epidemiological information. If result is POSITIVE SARS-CoV-2 target nucleic acids are DETECTED. The SARS-CoV-2 RNA is generally detectable in upper and lower  respiratory specimens dur ing the acute phase of infection.  Positive  results are indicative of active infection with SARS-CoV-2.  Clinical  correlation with patient history and other diagnostic information is  necessary to determine patient infection status.  Positive results do  not rule out bacterial infection or co-infection with other viruses. If result is PRESUMPTIVE POSTIVE SARS-CoV-2 nucleic acids MAY BE PRESENT.   A presumptive positive result was obtained on the submitted specimen  and confirmed on repeat testing.  While 2019 novel coronavirus  (SARS-CoV-2) nucleic acids may be present in the submitted sample  additional confirmatory testing may be necessary for epidemiological  and / or clinical management purposes  to differentiate between  SARS-CoV-2 and other Sarbecovirus currently known to infect humans.  If clinically indicated additional testing with an alternate test  methodology [phone]) is advised. The SARS-CoV-2 RNA is generally  detectable in upper and lower respiratory sp ecimens during the acute  phase of infection. The expected result is Negative. Fact Sheet for Patients:  StrictlyIdeas.no Fact Sheet for Healthcare Providers: BankingDealers.co.za This test is not yet approved or cleared by the [place] FDA and has been authorized for detection and/or diagnosis of SARS-CoV-2 by FDA under an Emergency Use Authorization (EUA).  This EUA will  remain in effect (meaning this test can be used) for the duration of the COVID-19 declaration under Section 564(b)(1) of the Act, 21 U.S.C. section 360bbb-3(b)(1), unless the authorization is terminated or revoked sooner. Performed at [facility], [address]  [place], [place] [address]   Urine culture     Status: None   Collection Time: 05/07/19  7:59 PM  Result Value Ref Range Status   Specimen Description   Final    URINE, RANDOM Performed at [place] [address] [place], [place] [address]    Special Requests   Final    NONE Performed at [facility], [place] [address] [place], [place] [address]    Culture   Final    NO GROWTH Performed at [facility], [place] [address], [place], [place] [address]    Report Status 05/09/2019 FINAL  Final  MRSA PCR Screening     Status: None   Collection Time: 05/07/19 11:17 PM  Result Value Ref Range Status   MRSA by PCR NEGATIVE NEGATIVE Final    Comment:        The GeneXpert MRSA Assay (FDA approved for NASAL specimens only), is one component of a comprehensive MRSA colonization surveillance program. It is not intended to diagnose MRSA infection nor to guide or monitor treatment for MRSA infections. Performed at [facility], [place] [address], [place], [place] [address]       Radiology Studies: Ct Head W & Wo Contrast  Result Date: 05/10/2019 CLINICAL DATA:  Small cell lung cancer. Confusion and hallucinations recently. EXAM: CT HEAD WITHOUT AND WITH CONTRAST TECHNIQUE: Contiguous axial images were obtained from the base of the skull through the vertex without and with intravenous contrast CONTRAST:  32mL OMNIPAQUE IOHEXOL 300 MG/ML  SOLN COMPARISON:  MRI 02/09/2018 FINDINGS: Brain: The brainstem and cerebellum are unremarkable. There are mild chronic small-vessel ischemic changes of the cerebral hemispheric white matter. Old lacunar infarction  in the right basal ganglia/internal capsule. No sign of recent infarction, mass lesion, hemorrhage, hydrocephalus or extra-axial collection. No abnormal enhancement occurs. Vascular: There is atherosclerotic calcification of the major vessels at the base of the brain. Skull: Negative Sinuses/Orbits: Clear/normal Other: None IMPRESSION: No evidence of metastatic disease. No evidence of acute or subacute infarction by CT. Background pattern of chronic small vessel ischemic change. Old lacunar infarction right basal ganglia/internal capsule. Electronically Signed   By: Nelson Chimes M.D.   On: 05/10/2019 14:00    Scheduled Meds:  amLODipine  5 mg Oral Daily   Chlorhexidine Gluconate Cloth  6 each Topical Daily   enoxaparin (LOVENOX) injection  40 mg Subcutaneous Q24H   estradiol  2 mg Oral Daily   FLUoxetine  40 mg Oral Daily   levothyroxine  50 mcg Oral Q0600   magnesium oxide  400 mg Oral BID   mouth rinse  15 mL Mouth Rinse BID   metoprolol succinate  25 mg Oral Daily   pantoprazole  40 mg Oral Daily   potassium chloride  40 mEq Oral Once   QUEtiapine  25 mg Oral QHS   Continuous Infusions:  sodium chloride 50 mL/hr at 05/11/19 0803     LOS: 5 days   Assessment & Plan:   Principal Problem:   Lung collapse Active Problems:   Small cell lung cancer, left (HCC)   Chronic abdominal pain   Chronic nausea   Chronic diarrhea  Lung collapse secondary to 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  Chest CT done at the time of admission showed  interval increase in the size of the left hilar mediastinal mass with increased mass-effect on the left mainstem bronchus.  Patient underwent bronchoscopy by PCCM on 05/10/2019 which showed diffuse studding of left mainstem bronchus with airway  narrowing.  CT chest also showed slightly increased infectious versus inflammatory groundglass opacity but her procalcitonin is unremarkable so no antibiotics were given.  She is tested negative for COVID-19.  Radiation oncology on board And plan to start radiation soon.  Delirium: Multifactorial.  She is on Xanax for anxiety which I will discontinue since benzodiazepines are known to worsen the delirium.  I will start her on Seroquel 25 mg nightly.  I will discontinue morning labs so she can sleep better.  I have discussed in detail and have instructed the nurses to keep her room lit all day long in order to prevent and treat delirium.  I also asked the nurses to remove her restraints since she looks better than yesterday and see how she does.  Acute kidney injury: Her baseline creatinine is around 1-1.1 and currently she is at 1.2.  Very close to baseline.  Continue gentle hydration.  Atypical chest pain: We will and troponins negative.  Recent ESBL UTI: Pleated course of ertapenem on 05/10/2019.  QTC prolongation on EKG: Closely and avoid QTC prolonging drugs.  COPD: Stable and not in exacerbation.  Continue current management.  Hypertension: Controlled.  Continue Toprol XL and Norvasc.  Hypothyroidism: Continue Synthroid.  Depression and anxiety: Tinea Prozac but discontinue Xanax for now.  DVT prophylaxis: Lovenox Code Status: Full code Family Communication: Called and discussed with Shea Stakes: And answered all the questions. Disposition Plan: Pending clinical improvement.   Time spent: 40 minutes   Darliss Cheney, MD [facility] Pager [phone]  If 7PM-7AM, please contact night-coverage [email or web address] Password [facility] 05/12/2019, 9:48 AM

## 2019-05-12 NOTE — Progress Notes (Signed)
[facility] Therapy Treatment Record Phone [phone]   Radiation Therapy was administered to Rebekah Henderson on: 05/12/2019  11:11 AM and was treatment # 2 out of a planned course of 10 treatments.  Radiation Treatment  1). Beam photons with 6-10 energy  2). Brachytherapy None  3). Stereotactic Radiosurgery None  4). Other Radiation None     Stasia Somero A Angeline Trick, RT (T)

## 2019-05-12 NOTE — Consult Note (Signed)
   [facility] Inpatient Consult   05/12/2019  Rebekah Henderson 07-31-1956 [id number]   Patient chart has been reviewed for readmissions less than 30 days and for extreme risk score, 37%, for unplanned readmissions. Patient eligible for [facility] CM services under ACO plan.  Chart review reveals patient continues to have some confusion and in restraints with hand mittens. Will continue to follow for progression and disposition plans.  Netta Cedars, MSN, [facility] Liaison Nurse Mobile Phone [phone]  Toll free office [phone]

## 2019-05-13 ENCOUNTER — Ambulatory Visit
Admit: 2019-05-13 | Discharge: 2019-05-13 | Disposition: A | Discharge status: Home / Self Care | Payer: Medicare Other | Attending: Radiation Oncology | Admitting: Radiation Oncology

## 2019-05-13 DIAGNOSIS — C778 Secondary and unspecified malignant neoplasm of lymph nodes of multiple regions: Secondary | ICD-10-CM

## 2019-05-13 DIAGNOSIS — Z781 Physical restraint status: Secondary | ICD-10-CM

## 2019-05-13 DIAGNOSIS — C3402 Malignant neoplasm of left main bronchus: Principal | ICD-10-CM

## 2019-05-13 LAB — COMPREHENSIVE METABOLIC PANEL
ALT: 9 U/L (ref 0–44)
AST: 12 U/L — ABNORMAL LOW (ref 15–41)
Albumin: 3.5 g/dL (ref 3.5–5.0)
Alkaline Phosphatase: 43 U/L (ref 38–126)
Anion gap: 10 (ref 5–15)
BUN: 17 mg/dL (ref 8–23)
CO2: 23 mmol/L (ref 22–32)
Calcium: 8.6 mg/dL — ABNORMAL LOW (ref 8.9–10.3)
Chloride: 108 mmol/L (ref 98–111)
Creatinine, Ser: 0.86 mg/dL (ref 0.44–1.00)
GFR calc Af Amer: >60 mL/min (ref >60)
GFR calc non Af Amer: >60 mL/min (ref >60)
Glucose, Bld: 101 mg/dL — ABNORMAL HIGH (ref 70–99)
Potassium: 3.5 mmol/L (ref 3.5–5.1)
Sodium: 141 mmol/L (ref 135–145)
Total Bilirubin: 0.9 mg/dL (ref 0.3–1.2)
Total Protein: 6.6 g/dL (ref 6.5–8.1)

## 2019-05-13 LAB — CBC WITH DIFFERENTIAL/PLATELET
Abs Immature Granulocytes: 0.04 10*3/uL (ref 0.00–0.07)
Basophils Absolute: 0 10*3/uL (ref 0.0–0.1)
Basophils Relative: 0 %
Eosinophils Absolute: 0 10*3/uL (ref 0.0–0.5)
Eosinophils Relative: 0 %
HCT: 24.5 % — ABNORMAL LOW (ref 36.0–46.0)
Hemoglobin: 7.8 g/dL — ABNORMAL LOW (ref 12.0–15.0)
Immature Granulocytes: 1 %
Lymphocytes Relative: 10 %
Lymphs Abs: 0.7 10*3/uL (ref 0.7–4.0)
MCH: 35 pg — ABNORMAL HIGH (ref 26.0–34.0)
MCHC: 31.8 g/dL (ref 30.0–36.0)
MCV: 109.9 fL — ABNORMAL HIGH (ref 80.0–100.0)
Monocytes Absolute: 0.6 10*3/uL (ref 0.1–1.0)
Monocytes Relative: 10 %
Neutro Abs: 5.3 10*3/uL (ref 1.7–7.7)
Neutrophils Relative %: 79 %
Platelets: 143 10*3/uL — ABNORMAL LOW (ref 150–400)
RBC: 2.23 MIL/uL — ABNORMAL LOW (ref 3.87–5.11)
RDW: 14.7 % (ref 11.5–15.5)
WBC: 6.6 10*3/uL (ref 4.0–10.5)
nRBC: 0 % (ref 0.0–0.2)

## 2019-05-13 MED ORDER — SODIUM CHLORIDE 0.9% FLUSH
10.0000 mL | INTRAVENOUS | Status: DC | PRN
  Administered 2019-05-17 (×2): 10 mL
  Filled 2019-05-13 (×2): qty 40

## 2019-05-13 MED ORDER — HALOPERIDOL LACTATE 5 MG/ML IJ SOLN
5.0000 mg | Freq: Once | INTRAMUSCULAR | Status: AC
  Administered 2019-05-13: 20:00:00 5 mg via INTRAVENOUS
  Filled 2019-05-13: qty 1

## 2019-05-13 MED ORDER — SODIUM CHLORIDE 0.9% FLUSH
10.0000 mL | Freq: Two times a day (BID) | INTRAVENOUS | Status: DC
  Administered 2019-05-17: 10 mL

## 2019-05-13 NOTE — Progress Notes (Signed)
HEMATOLOGY-ONCOLOGY PROGRESS NOTE  SUBJECTIVE: Remains confused and in restraints today.  She is oriented to person and time but not to place.  Denies chest discomfort and shortness of breath.  Denies headaches.  Denies abdominal pain, nausea, vomiting.  She reported having diarrhea yesterday.  Started radiation to her left chest mass on 05/12/2019.  She has no other complaints this morning.  Oncology History Overview Note  Patient presented with hoarseness, cough, and wheezing.  Work up showed mediastinal mass.   Small cell lung cancer, left ([place])   Staging form: Lung, AJCC 7th Edition   - Clinical stage from 09/19/2016: Stage IIIA (T1b, N2, M0) - Signed by Curt Bears, MD on 09/19/2016    Small cell lung cancer, left ([place])  09/12/2016 Surgery     09/12/2016 Surgery   Flexible video fiberoptic bronchoscopy with endobronchial ultrasound and biopsies.   09/13/2016 Pathology Results   Bronchus, biopsy, Left Main Stem - SMALL CELL CARCINOMA.    09/19/2016 Initial Diagnosis   Small cell lung cancer, left ([place])   09/27/2016 Imaging   PET IMPRESSION: 3.3 cm mass in the medial left lower lobe adjacent to the descending thoracic aorta, corresponding to suspected primary bronchogenic neoplasm. Associated mediastinal and left hilar nodal metastases.   09/27/2016 Imaging   MRI BrIMPRESSION: No acute intracranial abnormality. No intracranial metastatic disease.ain    10/02/2016 -  Chemotherapy   The patient had palonosetron (ALOXI) injection 0.25 mg, 0.25 mg, Intravenous,  Once, 1 of 6 cycles  pegfilgrastim (NEULASTA) injection 6 mg, 6 mg, Subcutaneous,  Once, 0 of 5 cycles  CISplatin (PLATINOL) 119 mg in sodium chloride 0.9 % 500 mL chemo infusion, 60 mg/m2 = 119 mg, Intravenous,  Once, 1 of 6 cycles  etoposide (VEPESID) 240 mg in sodium chloride 0.9 % 600 mL chemo infusion, 120 mg/m2 = 240 mg, Intravenous,  Once, 1 of 6 cycles  fosaprepitant (EMEND) 150 mg, dexamethasone  (DECADRON) 12 mg in sodium chloride 0.9 % 145 mL IVPB, , Intravenous,  Once, 1 of 6 cycles  palonosetron (ALOXI) injection 0.25 mg, 0.25 mg, Intravenous,  Once, 2 of 4 cycles  pegfilgrastim (NEULASTA) injection 6 mg, 6 mg, Subcutaneous,  Once, 2 of 4 cycles  CARBOplatin (PARAPLATIN) 480 mg in sodium chloride 0.9 % 250 mL chemo infusion, 480 mg (100 % of original dose 481.6 mg), Intravenous,  Once, 2 of 4 cycles Dose modification: 602 mg (original dose 481.6 mg, Cycle 1), 481.6 mg (original dose 481.6 mg, Cycle 1, Reason: Provider Judgment), 602 mg (original dose 602 mg, Cycle 2), 481.6 mg (original dose 602 mg, Cycle 2, Reason: Provider Judgment)  etoposide (VEPESID) 190 mg in sodium chloride 0.9 % 500 mL chemo infusion, 100 mg/m2 = 190 mg, Intravenous,  Once, 2 of 4 cycles  for chemotherapy treatment.     11/13/2018 -  Chemotherapy   The patient had palonosetron (ALOXI) injection 0.25 mg, 0.25 mg, Intravenous,  Once, 7 of 12 cycles Administration: 0.25 mg (11/13/2018), 0.25 mg (03/03/2019), 0.25 mg (12/03/2018), 0.25 mg (03/31/2019), 0.25 mg (04/07/2019), 0.25 mg (12/10/2018), 0.25 mg (12/22/2018), 0.25 mg (12/29/2018), 0.25 mg (02/03/2019), 0.25 mg (02/10/2019) irinotecan (CAMPTOSAR) 120 mg in dextrose 5 % 500 mL chemo infusion, 65 mg/m2 = 120 mg, Intravenous,  Once, 7 of 12 cycles Dose modification: 50 mg/m2 (original dose 65 mg/m2, Cycle 5, Reason: Dose not tolerated) Administration: 120 mg (11/13/2018), 80 mg (03/03/2019), 80 mg (12/03/2018), 80 mg (03/31/2019), 80 mg (04/07/2019), 80 mg (12/10/2018), 80 mg (12/22/2018), 80 mg (12/29/2018),  80 mg (02/03/2019), 80 mg (02/10/2019) CISplatin (PLATINOL) 54 mg in sodium chloride 0.9 % 250 mL chemo infusion, 30 mg/m2 = 54 mg, Intravenous,  Once, 7 of 12 cycles Dose modification: 25 mg/m2 (original dose 30 mg/m2, Cycle 5, Reason: Dose not tolerated) Administration: 54 mg (11/13/2018), 45 mg (03/03/2019), 45 mg (12/03/2018), 45 mg (03/31/2019), 45 mg (04/07/2019), 45 mg (12/10/2018),  45 mg (12/22/2018), 45 mg (12/29/2018), 45 mg (02/03/2019), 45 mg (02/10/2019) fosaprepitant (EMEND) 150 mg, dexamethasone (DECADRON) 12 mg in sodium chloride 0.9 % 145 mL IVPB, , Intravenous,  Once, 7 of 12 cycles Administration:  (11/13/2018),  (03/03/2019),  (12/03/2018),  (03/31/2019),  (04/07/2019),  (12/10/2018),  (12/22/2018),  (12/29/2018),  (02/03/2019),  (02/10/2019)  for chemotherapy treatment.       REVIEW OF SYSTEMS:   Constitutional: No fevers or chills Eyes: Denies blurriness of vision Ears, nose, mouth, throat, and face: Denies mucositis or sore throat Respiratory: Denies cough, dyspnea or wheezes Cardiovascular: Denies palpitation, chest discomfort Gastrointestinal:  Denies nausea, heartburn.  Reports diarrhea. Skin: Denies abnormal skin rashes Lymphatics: Denies new lymphadenopathy or easy bruising Neurological: Denies headache and dizziness Behavioral/Psych: Remains confused Extremities: No lower extremity edema All other systems were reviewed with the patient and are negative.  I have reviewed the past medical history, past surgical history, social history and family history with the patient and they are unchanged from previous note.   PHYSICAL EXAMINATION: ECOG PERFORMANCE STATUS: 2 - Symptomatic, <50% confined to bed  Vitals:   05/12/19 2049 05/13/19 0513  BP: 128/87 (!) 152/86  Pulse: 89 (!) 101  Resp: 16 16  Temp: 98 F (36.7 C) 98.7 F (37.1 C)  SpO2: 95% 93%   Filed Weights   05/07/19 1643  Weight: 149 lb 14.6 oz (68 kg)    Intake/Output from previous day: 06/10 0701 - 06/11 0700 In: 15 [P.O.:15] Out: 800 [Urine:800]  GENERAL:alert, confused, no distress SKIN: skin color, texture, turgor are normal, no rashes or significant lesions EYES: normal, Conjunctiva are pink and non-injected, sclera clear OROPHARYNX:no exudate, no erythema and lips, buccal mucosa, and tongue normal  NECK: supple, thyroid normal size, non-tender, without nodularity LYMPH:  no  palpable lymphadenopathy in the cervical, axillary or inguinal LUNGS: Decreased breath sounds on the left.  Right is clear. HEART: regular rate & rhythm and no murmurs and no lower extremity edema ABDOMEN:abdomen soft, non-tender and normal bowel sounds Musculoskeletal:no cyanosis of digits and no clubbing  NEURO: alert, follows commands.  Oriented to person and time.  Not oriented to place.  LABORATORY DATA:  I have reviewed the data as listed CMP Latest Ref Rng & Units 05/12/2019 05/11/2019 05/10/2019  Glucose 70 - 99 mg/dL 81 94 95  BUN 8 - 23 mg/dL 16 13 10   Creatinine 0.44 - 1.00 mg/dL 1.20(H) 1.44(H) 0.99  Sodium 135 - 145 mmol/L 141 141 141  Potassium 3.5 - 5.1 mmol/L 3.4(L) 3.6 3.5  Chloride 98 - 111 mmol/L 108 106 105  CO2 22 - 32 mmol/L 22 23 23   Calcium 8.9 - 10.3 mg/dL 8.5(L) 8.9 9.0  Total Protein 6.5 - 8.1 g/dL - - -  Total Bilirubin 0.3 - 1.2 mg/dL - - -  Alkaline Phos 38 - 126 U/L - - -  AST 15 - 41 U/L - - -  ALT 0 - 44 U/L - - -    Lab Results  Component Value Date   WBC 6.2 05/12/2019   HGB 8.0 (L) 05/12/2019   HCT 24.7 (L)  05/12/2019   MCV 110.8 (H) 05/12/2019   PLT 131 (L) 05/12/2019   NEUTROABS 2.6 05/07/2019    Ct Head W & Wo Contrast  Result Date: 05/10/2019 CLINICAL DATA:  Small cell lung cancer. Confusion and hallucinations recently. EXAM: CT HEAD WITHOUT AND WITH CONTRAST TECHNIQUE: Contiguous axial images were obtained from the base of the skull through the vertex without and with intravenous contrast CONTRAST:  80mL OMNIPAQUE IOHEXOL 300 MG/ML  SOLN COMPARISON:  MRI 02/09/2018 FINDINGS: Brain: The brainstem and cerebellum are unremarkable. There are mild chronic small-vessel ischemic changes of the cerebral hemispheric white matter. Old lacunar infarction in the right basal ganglia/internal capsule. No sign of recent infarction, mass lesion, hemorrhage, hydrocephalus or extra-axial collection. No abnormal enhancement occurs. Vascular: There is  atherosclerotic calcification of the major vessels at the base of the brain. Skull: Negative Sinuses/Orbits: Clear/normal Other: None IMPRESSION: No evidence of metastatic disease. No evidence of acute or subacute infarction by CT. Background pattern of chronic small vessel ischemic change. Old lacunar infarction right basal ganglia/internal capsule. Electronically Signed   By: Nelson Chimes M.D.   On: 05/10/2019 14:00   Ct Chest W Contrast  Result Date: 05/07/2019 CLINICAL DATA:  Lung cancer, UTI, chest pain, abdominal pain, vomiting EXAM: CT CHEST, ABDOMEN, AND PELVIS WITH CONTRAST TECHNIQUE: Multidetector CT imaging of the chest, abdomen and pelvis was performed following the standard protocol during bolus administration of intravenous contrast. CONTRAST:  135mL OMNIPAQUE IOHEXOL 300 MG/ML  SOLN COMPARISON:  Same day chest radiograph, CT chest abdomen pelvis, 04/09/2019 FINDINGS: CT CHEST FINDINGS Cardiovascular: No significant vascular findings. Normal heart size. No pericardial effusion. Right chest port catheter. Mediastinum/Nodes: Leftward shift of the mediastinum. Slight interval increase in size of a left hilar and mediastinal mass about the left mainstem bronchus, with increased mass effect on the left mainstem bronchus. Enlarged subcarinal lymph node and involvement of the midportion of the esophagus by mass. Normal thyroid. Small hiatal hernia. Lungs/Pleura: Near total atelectasis of the left lung secondary to left mainstem bronchial obstruction with a small pleural effusion. Slightly increased ground-glass opacity of the right upper lobe (series 4, image 50). Mild emphysema. Musculoskeletal: No chest wall mass. CT ABDOMEN PELVIS FINDINGS Hepatobiliary: No focal liver abnormality is seen. Status post cholecystectomy with postoperative biliary ductal dilatation. Pancreas: Unremarkable. No pancreatic ductal dilatation or surrounding inflammatory changes. Spleen: Normal in size without significant  abnormality. Adrenals/Urinary Tract: Adrenal glands are unremarkable. Kidneys are normal, without renal calculi, solid lesion, or hydronephrosis. Bladder is unremarkable. Stomach/Bowel: Stomach is within normal limits. Appendix appears normal. No evidence of bowel wall thickening, distention, or inflammatory changes. Sigmoid diverticulosis. Vascular/Lymphatic: Calcific atherosclerosis. No enlarged abdominal or pelvic lymph nodes. Reproductive: Status post hysterectomy. Other: No abdominal wall hernia or abnormality. No abdominopelvic ascites. Musculoskeletal: Unchanged sclerotic lesion of the L3 vertebral body. IMPRESSION: 1. Slight interval increase in size of a left hilar and mediastinal mass about the left mainstem bronchus, with increased mass effect on the left mainstem bronchus. Near total atelectasis of the left lung secondary to left mainstem bronchial obstruction with a small pleural effusion. 2. Slightly increased infectious or inflammatory ground-glass opacity of the right upper lobe (series 4, image 50). Mild emphysema. 3.  Unchanged sclerotic lesion of the L3 vertebral body. 4. Other chronic, incidental, and postoperative findings as detailed above. Electronically Signed   By: Eddie Candle M.D.   On: 05/07/2019 19:53   Ct Abdomen Pelvis W Contrast  Result Date: 05/07/2019 CLINICAL DATA:  Lung cancer, UTI,  chest pain, abdominal pain, vomiting EXAM: CT CHEST, ABDOMEN, AND PELVIS WITH CONTRAST TECHNIQUE: Multidetector CT imaging of the chest, abdomen and pelvis was performed following the standard protocol during bolus administration of intravenous contrast. CONTRAST:  166mL OMNIPAQUE IOHEXOL 300 MG/ML  SOLN COMPARISON:  Same day chest radiograph, CT chest abdomen pelvis, 04/09/2019 FINDINGS: CT CHEST FINDINGS Cardiovascular: No significant vascular findings. Normal heart size. No pericardial effusion. Right chest port catheter. Mediastinum/Nodes: Leftward shift of the mediastinum. Slight interval  increase in size of a left hilar and mediastinal mass about the left mainstem bronchus, with increased mass effect on the left mainstem bronchus. Enlarged subcarinal lymph node and involvement of the midportion of the esophagus by mass. Normal thyroid. Small hiatal hernia. Lungs/Pleura: Near total atelectasis of the left lung secondary to left mainstem bronchial obstruction with a small pleural effusion. Slightly increased ground-glass opacity of the right upper lobe (series 4, image 50). Mild emphysema. Musculoskeletal: No chest wall mass. CT ABDOMEN PELVIS FINDINGS Hepatobiliary: No focal liver abnormality is seen. Status post cholecystectomy with postoperative biliary ductal dilatation. Pancreas: Unremarkable. No pancreatic ductal dilatation or surrounding inflammatory changes. Spleen: Normal in size without significant abnormality. Adrenals/Urinary Tract: Adrenal glands are unremarkable. Kidneys are normal, without renal calculi, solid lesion, or hydronephrosis. Bladder is unremarkable. Stomach/Bowel: Stomach is within normal limits. Appendix appears normal. No evidence of bowel wall thickening, distention, or inflammatory changes. Sigmoid diverticulosis. Vascular/Lymphatic: Calcific atherosclerosis. No enlarged abdominal or pelvic lymph nodes. Reproductive: Status post hysterectomy. Other: No abdominal wall hernia or abnormality. No abdominopelvic ascites. Musculoskeletal: Unchanged sclerotic lesion of the L3 vertebral body. IMPRESSION: 1. Slight interval increase in size of a left hilar and mediastinal mass about the left mainstem bronchus, with increased mass effect on the left mainstem bronchus. Near total atelectasis of the left lung secondary to left mainstem bronchial obstruction with a small pleural effusion. 2. Slightly increased infectious or inflammatory ground-glass opacity of the right upper lobe (series 4, image 50). Mild emphysema. 3.  Unchanged sclerotic lesion of the L3 vertebral body. 4. Other  chronic, incidental, and postoperative findings as detailed above. Electronically Signed   By: Eddie Candle M.D.   On: 05/07/2019 19:53   Dg Chest Port 1 View  Result Date: 05/07/2019 CLINICAL DATA:  UTI, lung cancer, emesis EXAM: PORTABLE CHEST 1 VIEW COMPARISON:  04/03/2019 FINDINGS: There is total opacification of the left hemithorax. The cardiac and mediastinal borders are obscured. Right chest port catheter. IMPRESSION: There is total opacification of the left hemithorax, concerning for large left pleural effusion and/or left mainstem bronchial obstruction by hilar mass. Recommend CT to further evaluate. Electronically Signed   By: Eddie Candle M.D.   On: 05/07/2019 17:31    ASSESSMENT AND PLAN: 1.  Metastatic small cell lung cancer 2.  Complete collapse of the left lung secondary to extrinsic compression of the left mainstem bronchus 3.  Altered mental status 4. Urinary tract infection/pyelonephritis 5. 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 Tammi Klippel. -Monitor hemoglobin and transfuse for Hgb <7.0 or active bleeding.  CBC from today is pending. -For now, we will keep her follow-up appointment with medical oncology as scheduled on 05/17/2019.  If she is not discharged prior to that date, we will reschedule this.   LOS: 6 days   Mikey Bussing, DNP, AGPCNP-BC, AOCNP 05/13/19

## 2019-05-13 NOTE — Care Management Important Message (Signed)
Important Message  Patient Details IM Letter given to Velva Harman RN to present to the Patient Name: Rebekah Henderson MRN: [id number] Date of Birth: September 17, 1956   Medicare Important Message Given:  Yes    Kerin Salen 05/13/2019, 11:34 AM

## 2019-05-13 NOTE — Progress Notes (Signed)
PROGRESS NOTE    [name]  HYW:[id number] DOB: [birth year] DOA: 05/07/2019 PCP: Redmond School, MD   Brief Narrative:  Rebekah Henderson a 63 y.o.femalewith medical history significant ofsmall cell lung cancer on chemo, COPD, GERD, hypertension, hypothyroidism, IBS, and conditions listed below presenting to the hospital for evaluation of emesis. Patient was recently admitted on June 1 for ESBL UTI/pyelonephritis and treated with meropenem. She was converted to ertapenem on June 3 and discharged home for ongoing dosages until June 8.  Patient states she has been receiving chemotherapy for several years and has had intermittent nauseaand vomitingfor several years as well. States today she vomited after drinking ginger ale. She is also been having left lower quadrant abdominal pain for several years. Also reports having intermittent diarrhea for several years, most recent episode 2 days ago. States she has been seen at [facility] and another outside hospital and was told her symptoms were due to IBS, acid reflux, and fibromyalgia. She is also complaining of some pain in her lower sternal and upper abdominal region which she describes as a gurgling sensation. Also reports having acid reflux. States for the past few days she has been having sharp left-sided chest pain. Chest pain is nonexertional. Reports having chronic shortness of breath, no recent change. She uses 2 L home oxygen all the time.  Troponin x3 were checked and negative.  In the ED, work up revealed chest x-ray showed total opacification of the left hemithorax, concerning for large left pleural effusion and/or left mainstem bronchial obstruction by hilar mass. Chest CT showed slight interval increase in size of the left hilar and mediastinal mass about the left mainstem bronchus, with increased mass-effect on the left mainstem bronchus. Near-total atelectasis of the left lung secondary to left mainstem bronchial obstruction  with a small pleural effusion. Also showing slightly increased infectious or inflammatory groundglass opacity of the right upper lobe.  She underwent bronchoscopy on 6/8.  Consultants:   Oncology  Radiation oncology  PCCM  Procedures:   Bronchoscopy on 05/10/2019  Antimicrobials:   None  Subjective: Patient seen and examined.  She was again in mittens and wrist restraints.  She was slightly more alert compared to yesterday.  She was able to tell me current month, current year and president however she failed to tell me the place.  She thought she was at [facility].  Objective: Vitals:   05/12/19 0418 05/12/19 1352 05/12/19 2049 05/13/19 0513  BP: (!) 149/81 136/74 128/87 (!) 152/86  Pulse: (!) 102 97 89 (!) 101  Resp: 20 20 16 16   Temp: 97.6 F (36.4 C) 97.8 F (36.6 C) 98 F (36.7 C) 98.7 F (37.1 C)  TempSrc:  Oral    SpO2: 97% 90% 95% 93%  Weight:      Height:        Intake/Output Summary (Last 24 hours) at 05/13/2019 1142 Last data filed at 05/13/2019 0829 Gross per 24 hour  Intake 15 ml  Output -  Net 15 ml   Filed Weights   05/07/19 1643  Weight: 68 kg    Examination:  General exam: Appears calm and comfortable but slightly confused Respiratory system: Clear to auscultation. Respiratory effort normal. Cardiovascular system: S1 & S2 heard, RRR. No JVD, murmurs, rubs, gallops or clicks. No pedal edema. Gastrointestinal system: Abdomen is nondistended, soft and nontender. No organomegaly or masses felt. Normal bowel sounds heard. Central nervous system: Alert and oriented x3. No focal neurological deficits. Extremities: Symmetric 5 x  5 power. Skin: No rashes, lesions or ulcers Psychiatry: Judgement and insight appear poor. Mood & affect appropriate.   Data Reviewed: I have personally reviewed following labs and imaging studies  CBC: Recent Labs  Lab 05/07/19 1706 05/09/19 0500 05/10/19 0421 05/11/19 0416 05/12/19 0620  WBC 4.0 4.1 5.9  6.1 6.2  NEUTROABS 2.6  --   --   --   --   HGB 8.7* 9.2* 9.6* 9.2* 8.0*  HCT 25.7* 26.7* 29.4* 28.5* 24.7*  MCV 109.4* 109.4* 112.2* 111.8* 110.8*  PLT 137* 147* 175 152 416*   Basic Metabolic Panel: Recent Labs  Lab 05/07/19 2150 05/08/19 0104 05/09/19 0500 05/10/19 0421 05/11/19 0416 05/12/19 0620  NA  --  135 137 141 141 141  K  --  3.7 3.4* 3.5 3.6 3.4*  CL  --  102 103 105 106 108  CO2  --  24 25 23 23 22   GLUCOSE  --  85 106* 95 94 81  BUN  --  10 8 10 13 16   CREATININE  --  0.92 0.81 0.99 1.44* 1.20*  CALCIUM  --  8.5* 9.2 9.0 8.9 8.5*  MG 1.4*  --   --   --   --   --    GFR: Estimated Creatinine Clearance: 44.3 mL/min (A) (by C-G formula based on SCr of 1.2 mg/dL (H)). Liver Function Tests: Recent Labs  Lab 05/07/19 1706  AST 15  ALT 9  ALKPHOS 39  BILITOT 0.5  PROT 6.5  ALBUMIN 3.8   Recent Labs  Lab 05/07/19 1706  LIPASE 22   No results for input(s): AMMONIA in the last 168 hours. Coagulation Profile: No results for input(s): INR, PROTIME in the last 168 hours. Cardiac Enzymes: Recent Labs  Lab 05/07/19 1706 05/07/19 2150 05/08/19 0051 05/08/19 0830  TROPONINI <0.03 <0.03 <0.03 <0.03   BNP (last 3 results) No results for input(s): PROBNP in the last 8760 hours. HbA1C: No results for input(s): HGBA1C in the last 72 hours. CBG: Recent Labs  Lab 05/09/19 1722  GLUCAP 87   Lipid Profile: No results for input(s): CHOL, HDL, LDLCALC, TRIG, CHOLHDL, LDLDIRECT in the last 72 hours. Thyroid Function Tests: No results for input(s): TSH, T4TOTAL, FREET4, T3FREE, THYROIDAB in the last 72 hours. Anemia Panel: No results for input(s): VITAMINB12, FOLATE, FERRITIN, TIBC, IRON, RETICCTPCT in the last 72 hours. Sepsis Labs: Recent Labs  Lab 05/07/19 2150  PROCALCITON 0.10    Recent Results (from the past 240 hour(s))  Urine culture     Status: Abnormal   Collection Time: 05/03/19  6:19 PM   Specimen: Urine, Random  Result Value Ref Range  Status   Specimen Description   Final    URINE, RANDOM Performed at [facility], [address], [place], [place] [address]    Special Requests   Final    NONE Performed at [facility] [address], [place], [place] [address]    Culture (A)  Final    >=100,000 COLONIES/mL ESCHERICHIA COLI Confirmed Extended Spectrum Beta-Lactamase Producer (ESBL).  In bloodstream infections from ESBL organisms, carbapenems are preferred over piperacillin/tazobactam. They are shown to have a lower risk of mortality.    Report Status 05/05/2019 FINAL  Final   Organism ID, Bacteria ESCHERICHIA COLI (A)  Final      Susceptibility   Escherichia coli - MIC*    AMPICILLIN >=32 RESISTANT Resistant     CEFAZOLIN >=64 RESISTANT Resistant     CEFTRIAXONE >=64 RESISTANT  Resistant     CIPROFLOXACIN >=4 RESISTANT Resistant     GENTAMICIN <=1 SENSITIVE Sensitive     IMIPENEM <=0.25 SENSITIVE Sensitive     NITROFURANTOIN 32 SENSITIVE Sensitive     TRIMETH/SULFA >=320 RESISTANT Resistant     AMPICILLIN/SULBACTAM >=32 RESISTANT Resistant     PIP/TAZO <=4 SENSITIVE Sensitive     Extended ESBL POSITIVE Resistant     * >=100,000 COLONIES/mL ESCHERICHIA COLI  SARS Coronavirus 2 (CEPHEID - Performed in [place] hospital lab), Hosp Order     Status: None   Collection Time: 05/03/19  9:35 PM   Specimen: Nasopharyngeal Swab  Result Value Ref Range Status   SARS Coronavirus 2 NEGATIVE NEGATIVE Final    Comment: (NOTE) If result is NEGATIVE SARS-CoV-2 target nucleic acids are NOT DETECTED. The SARS-CoV-2 RNA is generally detectable in upper and lower  respiratory specimens during the acute phase of infection. The lowest  concentration of SARS-CoV-2 viral copies this assay can detect is 250  copies / mL. A negative result does not preclude SARS-CoV-2 infection  and should not be used as the sole basis for treatment or other  patient management decisions.  A negative result may occur with  improper specimen  collection / handling, submission of specimen other  than nasopharyngeal swab, presence of viral mutation(s) within the  areas targeted by this assay, and inadequate number of viral copies  (<250 copies / mL). A negative result must be combined with clinical  observations, patient history, and epidemiological information. If result is POSITIVE SARS-CoV-2 target nucleic acids are DETECTED. The SARS-CoV-2 RNA is generally detectable in upper and lower  respiratory specimens dur ing the acute phase of infection.  Positive  results are indicative of active infection with SARS-CoV-2.  Clinical  correlation with patient history and other diagnostic information is  necessary to determine patient infection status.  Positive results do  not rule out bacterial infection or co-infection with other viruses. If result is PRESUMPTIVE POSTIVE SARS-CoV-2 nucleic acids MAY BE PRESENT.   A presumptive positive result was obtained on the submitted specimen  and confirmed on repeat testing.  While 2019 novel coronavirus  (SARS-CoV-2) nucleic acids may be present in the submitted sample  additional confirmatory testing may be necessary for epidemiological  and / or clinical management purposes  to differentiate between  SARS-CoV-2 and other Sarbecovirus currently known to infect humans.  If clinically indicated additional testing with an alternate test  methodology [phone]) is advised. The SARS-CoV-2 RNA is generally  detectable in upper and lower respiratory sp ecimens during the acute  phase of infection. The expected result is Negative. Fact Sheet for Patients:  StrictlyIdeas.no Fact Sheet for Healthcare Providers: BankingDealers.co.za This test is not yet approved or cleared by the [place] FDA and has been authorized for detection and/or diagnosis of SARS-CoV-2 by FDA under an Emergency Use Authorization (EUA).  This EUA will remain in effect  (meaning this test can be used) for the duration of the COVID-19 declaration under Section 564(b)(1) of the Act, 21 U.S.C. section 360bbb-3(b)(1), unless the authorization is terminated or revoked sooner. Performed at [facility], [address] [place], [place] [address]   Culture, blood (routine x 2)     Status: None   Collection Time: 05/03/19 11:05 PM   Specimen: BLOOD RIGHT ARM  Result Value Ref Range Status   Specimen Description BLOOD RIGHT ARM  Final   Special Requests   Final    BOTTLES DRAWN AEROBIC AND ANAEROBIC  Blood Culture adequate volume   Culture   Final    NO GROWTH 5 DAYS Performed at [facility] [address], [place], [place] [address]    Report Status 05/08/2019 FINAL  Final  Culture, blood (routine x 2)     Status: None   Collection Time: 05/03/19 11:07 PM   Specimen: BLOOD RIGHT ARM  Result Value Ref Range Status   Specimen Description BLOOD RIGHT ARM  Final   Special Requests   Final    BOTTLES DRAWN AEROBIC AND ANAEROBIC Blood Culture adequate volume   Culture   Final    NO GROWTH 5 DAYS Performed at [facility], [address], [place], [place] [address]    Report Status 05/08/2019 FINAL  Final  SARS Coronavirus 2 (CEPHEID - Performed in [place] hospital lab), Hosp Order     Status: None   Collection Time: 05/07/19  5:32 PM   Specimen: Nasopharyngeal Swab  Result Value Ref Range Status   SARS Coronavirus 2 NEGATIVE NEGATIVE Final    Comment: (NOTE) If result is NEGATIVE SARS-CoV-2 target nucleic acids are NOT DETECTED. The SARS-CoV-2 RNA is generally detectable in upper and lower  respiratory specimens during the acute phase of infection. The lowest  concentration of SARS-CoV-2 viral copies this assay can detect is 250  copies / mL. A negative result does not preclude SARS-CoV-2 infection  and should not be used as the sole basis for treatment or other  patient management decisions.  A negative result may occur with  improper specimen  collection / handling, submission of specimen other  than nasopharyngeal swab, presence of viral mutation(s) within the  areas targeted by this assay, and inadequate number of viral copies  (<250 copies / mL). A negative result must be combined with clinical  observations, patient history, and epidemiological information. If result is POSITIVE SARS-CoV-2 target nucleic acids are DETECTED. The SARS-CoV-2 RNA is generally detectable in upper and lower  respiratory specimens dur ing the acute phase of infection.  Positive  results are indicative of active infection with SARS-CoV-2.  Clinical  correlation with patient history and other diagnostic information is  necessary to determine patient infection status.  Positive results do  not rule out bacterial infection or co-infection with other viruses. If result is PRESUMPTIVE POSTIVE SARS-CoV-2 nucleic acids MAY BE PRESENT.   A presumptive positive result was obtained on the submitted specimen  and confirmed on repeat testing.  While 2019 novel coronavirus  (SARS-CoV-2) nucleic acids may be present in the submitted sample  additional confirmatory testing may be necessary for epidemiological  and / or clinical management purposes  to differentiate between  SARS-CoV-2 and other Sarbecovirus currently known to infect humans.  If clinically indicated additional testing with an alternate test  methodology [phone]) is advised. The SARS-CoV-2 RNA is generally  detectable in upper and lower respiratory sp ecimens during the acute  phase of infection. The expected result is Negative. Fact Sheet for Patients:  StrictlyIdeas.no Fact Sheet for Healthcare Providers: BankingDealers.co.za This test is not yet approved or cleared by the [place] FDA and has been authorized for detection and/or diagnosis of SARS-CoV-2 by FDA under an Emergency Use Authorization (EUA).  This EUA will remain in effect  (meaning this test can be used) for the duration of the COVID-19 declaration under Section 564(b)(1) of the Act, 21 U.S.C. section 360bbb-3(b)(1), unless the authorization is terminated or revoked sooner. Performed at [facility], [address] [place], [place]  [address]   Urine culture     Status: None   Collection Time: 05/07/19  7:59 PM   Specimen: Urine, Random  Result Value Ref Range Status   Specimen Description   Final    URINE, RANDOM Performed at [facility] [address], [place], [place] [address]    Special Requests   Final    NONE Performed at [facility], [place] [address], [place], [place] [address]    Culture   Final    NO GROWTH Performed at [facility], [place] [address] [place], [place] [address]    Report Status 05/09/2019 FINAL  Final  MRSA PCR Screening     Status: None   Collection Time: 05/07/19 11:17 PM   Specimen: Nasal Mucosa; Nasopharyngeal  Result Value Ref Range Status   MRSA by PCR NEGATIVE NEGATIVE Final    Comment:        The GeneXpert MRSA Assay (FDA approved for NASAL specimens only), is one component of a comprehensive MRSA colonization surveillance program. It is not intended to diagnose MRSA infection nor to guide or monitor treatment for MRSA infections. Performed at [facility], [place] [address], [place], [place] [address]       Radiology Studies: No results found.  Scheduled Meds: . amLODipine  5 mg Oral Daily  . Chlorhexidine Gluconate Cloth  6 each Topical Daily  . enoxaparin (LOVENOX) injection  40 mg Subcutaneous Q24H  . estradiol  2 mg Oral Daily  . FLUoxetine  40 mg Oral Daily  . levothyroxine  50 mcg Oral Q0600  . magnesium oxide  400 mg Oral BID  . mouth rinse  15 mL Mouth Rinse BID  . metoprolol succinate  25 mg Oral Daily  . pantoprazole  40 mg Oral Daily  . QUEtiapine  25 mg Oral QHS  . sodium chloride flush  10-40 mL  Intracatheter Q12H   Continuous Infusions: . 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  Chest CT done at the time of admission showed interval increase in the size of the left hilar mediastinal mass with increased mass-effect on the left mainstem bronchus.  Patient underwent bronchoscopy by PCCM on 05/10/2019 which showed diffuse studding of left mainstem bronchus with airway narrowing.  CT chest also showed slightly increased infectious versus inflammatory groundglass opacity but her procalcitonin is unremarkable so no antibiotics were given.  She is tested negative for COVID-19.  Radiation oncology on board And she has received total of 3 doses of radiation so far.  Delirium: Multifactorial.  Slightly better today.  Continue to hold any benzodiazepines.  Continue on Seroquel.  She is slightly better today so I have asked the nurses to take her off of the mittens and restraints to see how she does.  Her lights were off again in her room.  I turned on all the lites.  Once again I communicated with the nurses to keep her lights on from 7 AM through 8  PM and let her sleep from 9 PM through 7 AM with no morning labs.  I have also placed an order for that.  Acute kidney injury: Her baseline creatinine is around 1-1.1 and she was 1.2 yesterday.  Today's labs are pending.  Atypical chest pain: No more pain.  Troponins negative..  Recent ESBL UTI: Pleated course of ertapenem on 05/10/2019.  QTC prolongation on EKG: Closely and avoid QTC prolonging  drugs.  COPD: Stable and not in exacerbation.  Continue current management.  Hypertension: Controlled.  Continue Toprol XL and Norvasc.  Hypothyroidism: Continue Synthroid.  Depression and anxiety: Tinea Prozac but discontinue Xanax for now.  DVT prophylaxis: Lovenox Code Status: Full code Family Communication: Called and discussed with Shea Stakes , again today.  Answered several questions.  Also told her that answered all the questions.  She is also willing to provide 24-hour supervision for the patient. Disposition Plan: Pending clinical improvement.  Potential discharge tomorrow.   Time spent: 29 minutes   Darliss Cheney, MD [facility] Pager [phone]  If 7PM-7AM, please contact night-coverage [email or web address] Password [facility] 05/13/2019, 11:42 AM

## 2019-05-13 NOTE — TOC Transition Note (Signed)
Transition of Care [facility]) - CM/SW Discharge Note   Patient Details  Name: Rebekah Henderson MRN: [id number] Date of Birth: 10-13-1956  Transition [facility] Care [facility]) CM/SW Contact:  Leeroy Cha, RN Phone Number: 05/13/2019, 3:33 PM   Clinical Narrative:    dcd to home with [facility] through [facility]   Final next level of care: [place] Barriers to Discharge: No Barriers Identified   Patient Goals and CMS Choice Patient states their goals for this hospitalization and ongoing recovery are:: to go home and get well CMS Medicare.gov Compare Post Acute Care list provided to:: Patient Choice offered to / list presented to : Patient  Discharge Placement                       Discharge Plan and Services   Discharge Planning Services: CM Consult Post Acute Care Choice: Home Health, Durable Medical Equipment          DME Arranged: [place] rolling, 3-N-1 DME Agency: [employer] Date DME Agency Contacted: 05/13/19 Time DME Agency Contacted: 0900 Representative spoke with at DME Agency: nathan HH Arranged: RN, PT [place] Agency: [place] ([place]) Date [place]: 05/13/19 Time [place]: 1532 Representative spoke with at [place]: [place] ([place]) Interventions     Readmission Risk Interventions Readmission Risk Prevention Plan 05/05/2019  Transportation Screening Complete  PCP or Specialist Appt within 3-5 Days Complete  HRI or [place] Complete  Social Work Consult for [place] Planning/Counseling Complete  Palliative Care Screening Not Applicable  Medication Review Press photographer) Complete  Some recent data might be hidden

## 2019-05-13 NOTE — Progress Notes (Signed)
[facility] Therapy Treatment Record Phone [phone]   Radiation Therapy was administered to Rebekah Henderson on: 05/13/2019  10:55 AM and was treatment # 3 out of a planned course of 10 treatments.  Radiation Treatment  1). Beam photons with 6-10 energy  2). Brachytherapy None  3). Stereotactic Radiosurgery None  4). Other Radiation None     Rebekah Henderson A Tamico Mundo, RT (T)

## 2019-05-14 ENCOUNTER — Ambulatory Visit
Admit: 2019-05-14 | Discharge: 2019-05-14 | Disposition: A | Discharge status: Home / Self Care | Payer: Medicare Other | Attending: Radiation Oncology | Admitting: Radiation Oncology

## 2019-05-14 DIAGNOSIS — F05 Delirium due to known physiological condition: Secondary | ICD-10-CM

## 2019-05-14 DIAGNOSIS — N179 Acute kidney failure, unspecified: Secondary | ICD-10-CM

## 2019-05-14 LAB — COMPREHENSIVE METABOLIC PANEL
ALT: 10 U/L (ref 0–44)
AST: 12 U/L — ABNORMAL LOW (ref 15–41)
Albumin: 3.2 g/dL — ABNORMAL LOW (ref 3.5–5.0)
Alkaline Phosphatase: 42 U/L (ref 38–126)
Anion gap: 7 (ref 5–15)
BUN: 17 mg/dL (ref 8–23)
CO2: 24 mmol/L (ref 22–32)
Calcium: 8.3 mg/dL — ABNORMAL LOW (ref 8.9–10.3)
Chloride: 107 mmol/L (ref 98–111)
Creatinine, Ser: 0.8 mg/dL (ref 0.44–1.00)
GFR calc Af Amer: >60 mL/min (ref >60)
GFR calc non Af Amer: >60 mL/min (ref >60)
Glucose, Bld: 108 mg/dL — ABNORMAL HIGH (ref 70–99)
Potassium: 3 mmol/L — ABNORMAL LOW (ref 3.5–5.1)
Sodium: 138 mmol/L (ref 135–145)
Total Bilirubin: 0.6 mg/dL (ref 0.3–1.2)
Total Protein: 6.4 g/dL — ABNORMAL LOW (ref 6.5–8.1)

## 2019-05-14 LAB — CBC
HCT: 23.3 % — ABNORMAL LOW (ref 36.0–46.0)
Hemoglobin: 7.7 g/dL — ABNORMAL LOW (ref 12.0–15.0)
MCH: 36 pg — ABNORMAL HIGH (ref 26.0–34.0)
MCHC: 33 g/dL (ref 30.0–36.0)
MCV: 108.9 fL — ABNORMAL HIGH (ref 80.0–100.0)
Platelets: 129 10*3/uL — ABNORMAL LOW (ref 150–400)
RBC: 2.14 MIL/uL — ABNORMAL LOW (ref 3.87–5.11)
RDW: 14.4 % (ref 11.5–15.5)
WBC: 5.2 10*3/uL (ref 4.0–10.5)
nRBC: 0 % (ref 0.0–0.2)

## 2019-05-14 LAB — MAGNESIUM: Magnesium: 1.5 mg/dL — ABNORMAL LOW (ref 1.7–2.4)

## 2019-05-14 MED ORDER — POTASSIUM CHLORIDE CRYS ER 20 MEQ PO TBCR
40.0000 meq | EXTENDED_RELEASE_TABLET | Freq: Three times a day (TID) | ORAL | Status: AC
  Administered 2019-05-14: 40 meq via ORAL
  Filled 2019-05-14: qty 2

## 2019-05-14 MED ORDER — MAGNESIUM SULFATE 2 GM/50ML IV SOLN
2.0000 g | Freq: Once | INTRAVENOUS | Status: AC
  Administered 2019-05-14: 2 g via INTRAVENOUS
  Filled 2019-05-14: qty 50

## 2019-05-14 NOTE — NC FL2 (Signed)
[place] LEVEL OF CARE SCREENING TOOL     IDENTIFICATION  Patient Name: Rebekah Henderson Birthdate: 04/04/1956 Sex: female Admission Date (Current Location): 05/07/2019  [facility] and [place] Number:  Herbalist and Address:  [facility],  [place] [address] [place]      Provider Number: [id number]  Attending Physician Name and Address:  Darliss Cheney, MD  Relative Name and Phone Number:       Current Level of Care: Hospital Recommended Level of Care: [place] Prior Approval Number:    Date Approved/Denied:   PASRR Number: [id number]  Discharge Plan: SNF    Current Diagnoses: Patient Active Problem List   Diagnosis Date Noted  . Delirium due to medical condition without behavioral disturbance 05/14/2019  . AKI (acute kidney injury) ([place]) 05/14/2019  . Chronic abdominal pain 05/08/2019  . Chronic nausea 05/08/2019  . Chronic diarrhea 05/08/2019  . Lung collapse 05/07/2019  . Acute pyelonephritis 05/03/2019  . Pyelonephritis 05/03/2019  . Nausea without vomiting 02/24/2019  . UTI due to extended-spectrum beta lactamase (ESBL) producing Escherichia coli 01/08/2019  . Viral gastroenteritis 01/08/2019  . Gastroenteritis 01/06/2019  . Goals of care, counseling/discussion 06/30/2018  . Encounter for antineoplastic immunotherapy 03/10/2018  . 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 Acute on chronic respiratory failure with hypoxia ([place]) 03/21/2017  . Mild protein-calorie malnutrition ([place]) 03/21/2017  . HCAP (healthcare-associated pneumonia) 03/20/2017  . Dehydration 03/06/2017  . Anemia 01/04/2017  . Thrombocytopenia ([place]) 01/04/2017  . Influenza A 12/10/2016  . SIRS (systemic inflammatory response syndrome) ([place]) 12/07/2016  . Hypoxia 12/07/2016  . Bronchitis 12/07/2016  . Encounter for antineoplastic chemotherapy 12/03/2016  . Antineoplastic chemotherapy induced anemia 12/03/2016   . Protein-calorie malnutrition, severe 10/30/2016  . Vomiting 10/29/2016  . Rash and nonspecific skin eruption 10/29/2016  . Neutropenic fever ([place]) 10/15/2016  . Intractable nausea and vomiting 10/15/2016  . Pancytopenia due to antineoplastic chemotherapy ([place]) 10/15/2016  . Nodular rash 10/15/2016  . Hypokalemia 10/15/2016  . Hypomagnesemia 10/15/2016  . Chronic respiratory failure with hypoxia ([place]) 10/15/2016  . Small cell lung cancer, left ([place]) 09/19/2016  . Mediastinal mass 09/10/2016  . COPD (chronic obstructive pulmonary disease) ([place])   . Hypertension   . Fibromyalgia   . IBS (irritable bowel syndrome)   . GERD (gastroesophageal reflux disease)   . Hypothyroidism   . Colon polyps   . Anxiety   . PONV (postoperative nausea and vomiting)   . Diarrhea 05/12/2013    Orientation RESPIRATION BLADDER Height & Weight     Self  Normal Incontinent Weight: 149 lb 14.6 oz (68 kg) Height:  5\' 4"  (162.6 cm)  BEHAVIORAL SYMPTOMS/MOOD NEUROLOGICAL BOWEL NUTRITION STATUS      Incontinent Diet(see dc summary)  AMBULATORY STATUS COMMUNICATION OF NEEDS Skin   Extensive Assist Verbally Normal                       Personal Care Assistance Level of Assistance  Bathing, Feeding, Dressing Bathing Assistance: Limited assistance Feeding assistance: Limited assistance Dressing Assistance: Limited assistance     Functional Limitations Info  Sight, Hearing, Speech Sight Info: Impaired Hearing Info: Adequate Speech Info: Adequate    SPECIAL CARE FACTORS FREQUENCY  PT (By licensed PT), OT (By licensed OT)     PT Frequency: 5x/week OT Frequency: 5x/week            Contractures Contractures Info: Not present  Additional Factors Info  Code Status, Allergies Code Status Info: Full Allergies Info: Penicillins, Codeine, Fentanyl, Ranitidine Hcl, Keflex (Cephalexin), Lyrica (Pregabalin)           Current Medications (05/14/2019):  This is the current hospital active  medication list Current Facility-Administered Medications  Medication Dose Route Frequency Provider Last Rate Last Dose  . 0.9 %  sodium chloride infusion   Intravenous Continuous Dessa Phi, DO 50 mL/hr at 05/14/19 1157    . acetaminophen (TYLENOL) tablet 650 mg  650 mg Oral Q6H PRN Shela Leff, MD       Or  . acetaminophen (TYLENOL) suppository 650 mg  650 mg Rectal Q6H PRN Shela Leff, MD      . alum & mag hydroxide-simeth (MAALOX/MYLANTA) 200-200-20 MG/5ML suspension 30 mL  30 mL Oral Q6H PRN Shela Leff, MD       And  . lidocaine (XYLOCAINE) 2 % viscous mouth solution 15 mL  15 mL Oral Q6H PRN Shela Leff, MD      . amLODipine (NORVASC) tablet 5 mg  5 mg Oral Daily Shela Leff, MD   5 mg at 05/14/19 0915  . Chlorhexidine Gluconate Cloth 2 % PADS 6 each  6 each Topical Daily Shela Leff, MD   6 each at 05/13/19 1017  . dicyclomine (BENTYL) tablet 20 mg  20 mg Oral TID PRN Shela Leff, MD      . enoxaparin (LOVENOX) injection 40 mg  40 mg Subcutaneous Q24H Chesley Mires, MD   40 mg at 05/14/19 2831  . estradiol (ESTRACE) tablet 2 mg  2 mg Oral Daily Shela Leff, MD   2 mg at 05/14/19 1149  . FLUoxetine (PROZAC) capsule 40 mg  40 mg Oral Daily Shela Leff, MD   40 mg at 05/14/19 0917  . haloperidol lactate (HALDOL) injection 1 mg  1 mg Intravenous Q6H PRN Darliss Cheney, MD   1 mg at 05/13/19 1846  . HYDROcodone-acetaminophen (NORCO/VICODIN) 5-325 MG per tablet 1-2 tablet  1-2 tablet Oral Q6H PRN Shela Leff, MD   1 tablet at 05/14/19 1026  . ipratropium-albuterol (DUONEB) 0.5-2.5 (3) MG/3ML nebulizer solution 3 mL  3 mL Nebulization Q6H PRN Shela Leff, MD      . levothyroxine (SYNTHROID) tablet 50 mcg  50 mcg Oral Q0600 Shela Leff, MD   50 mcg at 05/14/19 0920  . loperamide (IMODIUM) capsule 2 mg  2 mg Oral Q2H PRN Shela Leff, MD   2 mg at 05/10/19 0920  . magnesium oxide (MAG-OX) tablet 400 mg   400 mg Oral BID Shela Leff, MD   400 mg at 05/13/19 2027  . MEDLINE mouth rinse  15 mL Mouth Rinse BID Dessa Phi, DO   15 mL at 05/11/19 2114  . metoprolol succinate (TOPROL-XL) 24 hr tablet 25 mg  25 mg Oral Daily Shela Leff, MD   25 mg at 05/14/19 0915  . morphine 2 MG/ML injection 1 mg  1 mg Intravenous Q3H PRN Shela Leff, MD   1 mg at 05/11/19 1457  . pantoprazole (PROTONIX) EC tablet 40 mg  40 mg Oral Daily Shela Leff, MD   40 mg at 05/14/19 0918  . potassium chloride SA (K-DUR) CR tablet 40 mEq  40 mEq Oral TID Darliss Cheney, MD   40 mEq at 05/14/19 1158  . promethazine (PHENERGAN) injection 12.5 mg  12.5 mg Intravenous Q6H PRN Shela Leff, MD   12.5 mg at 05/08/19 2021  . QUEtiapine (SEROQUEL) tablet 25 mg  25 mg Oral QHS Darliss Cheney, MD   25 mg at 05/13/19 2028  . sodium chloride flush (NS) 0.9 % injection 10-40 mL  10-40 mL Intracatheter Q12H Pahwani, Ravi, MD      . sodium chloride flush (NS) 0.9 % injection 10-40 mL  10-40 mL Intracatheter PRN Darliss Cheney, MD       Facility-Administered Medications Ordered in Other Encounters  Medication Dose Route Frequency Provider Last Rate Last Dose  . 0.9 %  sodium chloride infusion   Intravenous Continuous Curt Bears, MD   Stopped at 04/27/19 1210  . sodium chloride flush (NS) 0.9 % injection 10 mL  10 mL Intracatheter PRN Curt Bears, MD   10 mL at 03/16/19 1001  . sodium chloride flush (NS) 0.9 % injection 10 mL  10 mL Intracatheter PRN Curt Bears, MD   10 mL at 04/27/19 1212     Discharge Medications: Please see discharge summary for a list of discharge medications.  Relevant Imaging Results:  Relevant Lab Results:   Additional Information SSN: [id number]  Servando Snare, LCSW

## 2019-05-14 NOTE — Discharge Summary (Deleted)
Physician Discharge Summary  Rebekah Henderson OZD:[id number] DOB: 1956-02-27 DOA: 05/07/2019  PCP: Redmond School, MD  Admit date: 05/07/2019 Discharge date: 05/14/2019  Admitted From: Home Disposition: Home  Recommendations for Outpatient Follow-up:  1. Follow up with PCP in 1-2 weeks 2. Please obtain BMP/CBC in one week 3. Please follow up on the following pending results:  Home Health: Yes Equipment/Devices: None  Discharge Condition: Stable CODE STATUS: Full code Diet recommendation: Cardiac  Subjective: Patient seen and examined earlier this morning.  She was still in restraints this morning.  For the first time today, she was able to tell me that she was at [facility].  She answered all 4 questions about orientation correctly.  She had no complaints.  She also told me her home address and about her friend [name].  Brief/Interim Summary: Nocole Zammit Henderson a 63 y.o.femalewith medical history significant ofsmall cell lung cancer on chemo, COPD, GERD, hypertension, hypothyroidism, IBS, presented to the hospital for evaluation of emesis. Patient was recently admitted on June 1 for ESBL UTI/pyelonephritis and treated with meropenem. She was converted to ertapenem on June 3 and discharged home for ongoing dosages until June 8.  Patient has been receiving chemotherapy for several years and has had intermittent nauseaand vomitingfor several years as well. She is also been having left lower quadrant abdominal pain for several years. Also having intermittent diarrhea for several years. Stated for the past few days she has been having sharp left-sided chest pain. Chest pain was nonexertional. Reported chronic shortness of breath, no recent change. She uses 2 L home oxygen all the time.  Troponin x3 were checked and negative.  In the ED, work up revealed chest x-ray showed total opacification of the left hemithorax, concerning for large left pleural effusion and/or left mainstem  bronchial obstruction by hilar mass. Chest CT showed slight interval increase in size of the left hilar and mediastinal mass about the left mainstem bronchus, with increased mass-effect on the left mainstem bronchus. Near-total atelectasis of the left lung secondary to left mainstem bronchial obstruction with a small pleural effusion. Also showing slightly increased infectious or inflammatory groundglass opacity of the right upper lobe.She underwent bronchoscopy on 6/8 which showed diffuse studding of left mainstem bronchus with airway narrowing. CT chest also showed slightly increased infectious versus inflammatory groundglass opacity but her procalcitonin is unremarkable so no antibiotics were given.  She was tested negative for COVID-19. Radiation oncology was consulted and she was started on radiation and has received total of 4 doses out of 10.  She also came in with acute kidney injury which has resolved since then.  The hospitalization was also complicated with hospital associated delirium for past few days but the patient is doing much better today and she is completely alert and oriented and out of her restraints.  She was seen by PT OT and they recommended discharging to either SNF or home with 24-hour supervision.  Patient lives alone in her home but has a very close friend named [name].  I have been talking to [place] every day for updates and [name] reaffirmed that she is willing to provide her 24-hour supervision and would not want her to go to skilled nursing facility instead.  I did talk to her again today. Home health has been arranged for her and now that patient is hemodynamically medically stable so she is going to be discharged.   Discharge Diagnoses:  Principal Problem:   Lung collapse Active Problems:   Small cell  lung cancer, left (HCC)   Chronic abdominal pain   Chronic nausea   Chronic diarrhea   Delirium due to medical condition without behavioral disturbance   AKI (acute  kidney injury) [facility])    Discharge Instructions  Discharge Instructions    Discharge patient   Complete by: As directed    Discharge disposition: 06-Home-Health Care Svc   Discharge patient date: 05/14/2019     Allergies as of 05/14/2019      Reactions   Penicillins Hives, Swelling, Other (See Comments)   Reaction:  Face/mouth swelling  Has patient had a PCN reaction causing immediate rash, facial/tongue/throat swelling, SOB or lightheadedness with hypotension: Yes Has patient had a PCN reaction causing severe rash involving mucus membranes or skin necrosis: No Has patient had a PCN reaction that required hospitalization No Has patient had a PCN reaction occurring within the last 10 years: No If all of the above answers are "NO", then may proceed with Cephalosporin use.   Codeine Nausea Only   Fentanyl Nausea And Vomiting   restless   Ranitidine Hcl Other (See Comments), Nausea Only   Reaction:  Dizziness    Keflex [cephalexin] Other (See Comments)   Reaction:  Unknown    Lyrica [pregabalin] Other (See Comments)   Reaction:  Somnolence       Medication List    STOP taking these medications   ertapenem  IVPB Commonly known as: INVANZ     TAKE these medications   albuterol 108 (90 Base) MCG/ACT inhaler Commonly known as: VENTOLIN HFA Inhale 2 puffs into the lungs every 4 (four) hours as needed for wheezing or shortness of breath.   ALPRAZolam 0.25 MG tablet Commonly known as: XANAX Take 2 tablets (0.5 mg total) by mouth at bedtime as needed for anxiety. What changed:   how much to take  when to take this  reasons to take this   amLODipine 5 MG tablet Commonly known as: NORVASC Take 5 mg by mouth daily.   AZO-TABS 95 MG tablet Generic drug: phenazopyridine Take 190 mg by mouth 2 (two) times a day.   cyclobenzaprine 5 MG tablet Commonly known as: FLEXERIL TAKE ONE TABLET 3 TIMES A DAY AS NEEDED. What changed:   how much to take  how to take this  when  to take this  reasons to take this  additional instructions   dicyclomine 20 MG tablet Commonly known as: BENTYL Take 20 mg by mouth 3 (three) times daily as needed for spasms.   diphenoxylate-atropine 2.5-0.025 MG tablet Commonly known as: LOMOTIL TAKE 1 TABLET BY MOUTH 4 TIMES DAILY AS NEEDED FOR LOOSE STOOLS. What changed:   how much to take  how to take this  when to take this  reasons to take this  additional instructions   estazolam 2 MG tablet Commonly known as: PROSOM Take 2 mg by mouth at bedtime.   estradiol 2 MG tablet Commonly known as: ESTRACE Take 2 mg by mouth daily.   FLUoxetine 40 MG capsule Commonly known as: PROZAC Take 40 mg by mouth daily.   HYDROcodone-acetaminophen 5-325 MG tablet Commonly known as: NORCO/VICODIN Take 1 tablet by mouth every 4 (four) hours as needed for moderate pain.   levothyroxine 50 MCG tablet Commonly known as: SYNTHROID Take 50 mcg by mouth daily before breakfast.   lidocaine-prilocaine cream Commonly known as: EMLA Apply 1 application topically as needed. To numb skin over port a cath: Squeeze a  small amount on cotton ball and place  over port site 1-2 hours prior to chemotherapy.   loperamide 2 MG capsule Commonly known as: IMODIUM Take 2 mg by mouth every 2 (two) hours as needed for diarrhea or loose stools.   magnesium oxide 400 (241.3 Mg) MG tablet Commonly known as: MAG-OX Take 1 tablet (400 mg total) by mouth 2 (two) times daily.   metoprolol succinate 25 MG 24 hr tablet Commonly known as: TOPROL-XL Take 25 mg by mouth daily.   ondansetron 4 MG disintegrating tablet Commonly known as: ZOFRAN-ODT Dissolve one tablet by mouth every 8 hours as needed for nausea and vomiting,   OXYGEN Inhale 2 L into the lungs daily as needed (for shortness of breath).   pantoprazole 40 MG tablet Commonly known as: PROTONIX Take 40 mg by mouth 2 (two) times daily before a meal.   potassium chloride SA 20 MEQ  tablet Commonly known as: K-DUR Take 20 mEq by mouth daily as needed (for supplement).   promethazine 25 MG tablet Commonly known as: PHENERGAN TAKE ONE TABLET BY MOUTH EVERY 6 HOURS AS NEEDED. What changed: reasons to take this   senna-docusate 8.6-50 MG tablet Commonly known as: Senokot-S Take 1 tablet by mouth daily as needed for mild constipation or moderate constipation.      Follow-up Information    Redmond School, MD Follow up in 1 week(s).   Specialty: Internal Medicine Contact information: [address] [place] [address] [phone]          Allergies  Allergen Reactions  . Penicillins Hives, Swelling and Other (See Comments)    Reaction:  Face/mouth swelling  Has patient had a PCN reaction causing immediate rash, facial/tongue/throat swelling, SOB or lightheadedness with hypotension: Yes Has patient had a PCN reaction causing severe rash involving mucus membranes or skin necrosis: No Has patient had a PCN reaction that required hospitalization No Has patient had a PCN reaction occurring within the last 10 years: No If all of the above answers are "NO", then may proceed with Cephalosporin use.  . Codeine Nausea Only  . Fentanyl Nausea And Vomiting    restless  . Ranitidine Hcl Other (See Comments) and Nausea Only    Reaction:  Dizziness   . Keflex [Cephalexin] Other (See Comments)    Reaction:  Unknown   . Lyrica [Pregabalin] Other (See Comments)    Reaction:  Somnolence     Consultations: Oncology, radiation oncology, PCCM    Procedures/Studies: Ct Head W & Wo Contrast  Result Date: 05/10/2019 CLINICAL DATA:  Small cell lung cancer. Confusion and hallucinations recently. EXAM: CT HEAD WITHOUT AND WITH CONTRAST TECHNIQUE: Contiguous axial images were obtained from the base of the skull through the vertex without and with intravenous contrast CONTRAST:  78mL OMNIPAQUE IOHEXOL 300 MG/ML  SOLN COMPARISON:  MRI 02/09/2018 FINDINGS: Brain: The  brainstem and cerebellum are unremarkable. There are mild chronic small-vessel ischemic changes of the cerebral hemispheric white matter. Old lacunar infarction in the right basal ganglia/internal capsule. No sign of recent infarction, mass lesion, hemorrhage, hydrocephalus or extra-axial collection. No abnormal enhancement occurs. Vascular: There is atherosclerotic calcification of the major vessels at the base of the brain. Skull: Negative Sinuses/Orbits: Clear/normal Other: None IMPRESSION: No evidence of metastatic disease. No evidence of acute or subacute infarction by CT. Background pattern of chronic small vessel ischemic change. Old lacunar infarction right basal ganglia/internal capsule. Electronically Signed   By: Nelson Chimes M.D.   On: 05/10/2019 14:00   Ct Chest W Contrast  Result Date:  05/07/2019 CLINICAL DATA:  Lung cancer, UTI, chest pain, abdominal pain, vomiting EXAM: CT CHEST, ABDOMEN, AND PELVIS WITH CONTRAST TECHNIQUE: Multidetector CT imaging of the chest, abdomen and pelvis was performed following the standard protocol during bolus administration of intravenous contrast. CONTRAST:  172mL OMNIPAQUE IOHEXOL 300 MG/ML  SOLN COMPARISON:  Same day chest radiograph, CT chest abdomen pelvis, 04/09/2019 FINDINGS: CT CHEST FINDINGS Cardiovascular: No significant vascular findings. Normal heart size. No pericardial effusion. Right chest port catheter. Mediastinum/Nodes: Leftward shift of the mediastinum. Slight interval increase in size of a left hilar and mediastinal mass about the left mainstem bronchus, with increased mass effect on the left mainstem bronchus. Enlarged subcarinal lymph node and involvement of the midportion of the esophagus by mass. Normal thyroid. Small hiatal hernia. Lungs/Pleura: Near total atelectasis of the left lung secondary to left mainstem bronchial obstruction with a small pleural effusion. Slightly increased ground-glass opacity of the right upper lobe (series 4, image  50). Mild emphysema. Musculoskeletal: No chest wall mass. CT ABDOMEN PELVIS FINDINGS Hepatobiliary: No focal liver abnormality is seen. Status post cholecystectomy with postoperative biliary ductal dilatation. Pancreas: Unremarkable. No pancreatic ductal dilatation or surrounding inflammatory changes. Spleen: Normal in size without significant abnormality. Adrenals/Urinary Tract: Adrenal glands are unremarkable. Kidneys are normal, without renal calculi, solid lesion, or hydronephrosis. Bladder is unremarkable. Stomach/Bowel: Stomach is within normal limits. Appendix appears normal. No evidence of bowel wall thickening, distention, or inflammatory changes. Sigmoid diverticulosis. Vascular/Lymphatic: Calcific atherosclerosis. No enlarged abdominal or pelvic lymph nodes. Reproductive: Status post hysterectomy. Other: No abdominal wall hernia or abnormality. No abdominopelvic ascites. Musculoskeletal: Unchanged sclerotic lesion of the L3 vertebral body. IMPRESSION: 1. Slight interval increase in size of a left hilar and mediastinal mass about the left mainstem bronchus, with increased mass effect on the left mainstem bronchus. Near total atelectasis of the left lung secondary to left mainstem bronchial obstruction with a small pleural effusion. 2. Slightly increased infectious or inflammatory ground-glass opacity of the right upper lobe (series 4, image 50). Mild emphysema. 3.  Unchanged sclerotic lesion of the L3 vertebral body. 4. Other chronic, incidental, and postoperative findings as detailed above. Electronically Signed   By: Eddie Candle M.D.   On: 05/07/2019 19:53   Ct Abdomen Pelvis W Contrast  Result Date: 05/07/2019 CLINICAL DATA:  Lung cancer, UTI, chest pain, abdominal pain, vomiting EXAM: CT CHEST, ABDOMEN, AND PELVIS WITH CONTRAST TECHNIQUE: Multidetector CT imaging of the chest, abdomen and pelvis was performed following the standard protocol during bolus administration of intravenous contrast.  CONTRAST:  160mL OMNIPAQUE IOHEXOL 300 MG/ML  SOLN COMPARISON:  Same day chest radiograph, CT chest abdomen pelvis, 04/09/2019 FINDINGS: CT CHEST FINDINGS Cardiovascular: No significant vascular findings. Normal heart size. No pericardial effusion. Right chest port catheter. Mediastinum/Nodes: Leftward shift of the mediastinum. Slight interval increase in size of a left hilar and mediastinal mass about the left mainstem bronchus, with increased mass effect on the left mainstem bronchus. Enlarged subcarinal lymph node and involvement of the midportion of the esophagus by mass. Normal thyroid. Small hiatal hernia. Lungs/Pleura: Near total atelectasis of the left lung secondary to left mainstem bronchial obstruction with a small pleural effusion. Slightly increased ground-glass opacity of the right upper lobe (series 4, image 50). Mild emphysema. Musculoskeletal: No chest wall mass. CT ABDOMEN PELVIS FINDINGS Hepatobiliary: No focal liver abnormality is seen. Status post cholecystectomy with postoperative biliary ductal dilatation. Pancreas: Unremarkable. No pancreatic ductal dilatation or surrounding inflammatory changes. Spleen: Normal in size without significant abnormality. Adrenals/Urinary Tract:  Adrenal glands are unremarkable. Kidneys are normal, without renal calculi, solid lesion, or hydronephrosis. Bladder is unremarkable. Stomach/Bowel: Stomach is within normal limits. Appendix appears normal. No evidence of bowel wall thickening, distention, or inflammatory changes. Sigmoid diverticulosis. Vascular/Lymphatic: Calcific atherosclerosis. No enlarged abdominal or pelvic lymph nodes. Reproductive: Status post hysterectomy. Other: No abdominal wall hernia or abnormality. No abdominopelvic ascites. Musculoskeletal: Unchanged sclerotic lesion of the L3 vertebral body. IMPRESSION: 1. Slight interval increase in size of a left hilar and mediastinal mass about the left mainstem bronchus, with increased mass effect on  the left mainstem bronchus. Near total atelectasis of the left lung secondary to left mainstem bronchial obstruction with a small pleural effusion. 2. Slightly increased infectious or inflammatory ground-glass opacity of the right upper lobe (series 4, image 50). Mild emphysema. 3.  Unchanged sclerotic lesion of the L3 vertebral body. 4. Other chronic, incidental, and postoperative findings as detailed above. Electronically Signed   By: Eddie Candle M.D.   On: 05/07/2019 19:53   Dg Chest Port 1 View  Result Date: 05/07/2019 CLINICAL DATA:  UTI, lung cancer, emesis EXAM: PORTABLE CHEST 1 VIEW COMPARISON:  04/03/2019 FINDINGS: There is total opacification of the left hemithorax. The cardiac and mediastinal borders are obscured. Right chest port catheter. IMPRESSION: There is total opacification of the left hemithorax, concerning for large left pleural effusion and/or left mainstem bronchial obstruction by hilar mass. Recommend CT to further evaluate. Electronically Signed   By: Eddie Candle M.D.   On: 05/07/2019 17:31     Discharge Exam: Vitals:   05/13/19 2112 05/14/19 0701  BP: 128/90 133/73  Pulse: 85 83  Resp: 20 16  Temp:  97.9 F (36.6 C)  SpO2: 97% 100%   Vitals:   05/13/19 0513 05/13/19 1318 05/13/19 2112 05/14/19 0701  BP: (!) 152/86 (!) 155/77 128/90 133/73  Pulse: (!) 101 84 85 83  Resp: 16 16 20 16   Temp: 98.7 F (37.1 C) 98.2 F (36.8 C)  97.9 F (36.6 C)  TempSrc:  Oral  Oral  SpO2: 93% 94% 97% 100%  Weight:      Height:        General: Pt is alert, awake, not in acute distress Cardiovascular: RRR, S1/S2 +, no rubs, no gallops Respiratory: Slightly diminished breath sounds at left base, no wheezing, no rhonchi Abdominal: Soft, NT, ND, bowel sounds + Extremities: no edema, no cyanosis    The results of significant diagnostics from this hospitalization (including imaging, microbiology, ancillary and laboratory) are listed below for reference.      Microbiology: Recent Results (from the past 240 hour(s))  SARS Coronavirus 2 (CEPHEID - Performed in [place] hospital lab), Hosp Order     Status: None   Collection Time: 05/07/19  5:32 PM   Specimen: Nasopharyngeal Swab  Result Value Ref Range Status   SARS Coronavirus 2 NEGATIVE NEGATIVE Final    Comment: (NOTE) If result is NEGATIVE SARS-CoV-2 target nucleic acids are NOT DETECTED. The SARS-CoV-2 RNA is generally detectable in upper and lower  respiratory specimens during the acute phase of infection. The lowest  concentration of SARS-CoV-2 viral copies this assay can detect is 250  copies / mL. A negative result does not preclude SARS-CoV-2 infection  and should not be used as the sole basis for treatment or other  patient management decisions.  A negative result may occur with  improper specimen collection / handling, submission of specimen other  than nasopharyngeal swab, presence of viral mutation(s) within the  areas targeted by this assay, and inadequate number of viral copies  (<250 copies / mL). A negative result must be combined with clinical  observations, patient history, and epidemiological information. If result is POSITIVE SARS-CoV-2 target nucleic acids are DETECTED. The SARS-CoV-2 RNA is generally detectable in upper and lower  respiratory specimens dur ing the acute phase of infection.  Positive  results are indicative of active infection with SARS-CoV-2.  Clinical  correlation with patient history and other diagnostic information is  necessary to determine patient infection status.  Positive results do  not rule out bacterial infection or co-infection with other viruses. If result is PRESUMPTIVE POSTIVE SARS-CoV-2 nucleic acids MAY BE PRESENT.   A presumptive positive result was obtained on the submitted specimen  and confirmed on repeat testing.  While 2019 novel coronavirus  (SARS-CoV-2) nucleic acids may be present in the submitted sample   additional confirmatory testing may be necessary for epidemiological  and / or clinical management purposes  to differentiate between  SARS-CoV-2 and other Sarbecovirus currently known to infect humans.  If clinically indicated additional testing with an alternate test  methodology [phone]) is advised. The SARS-CoV-2 RNA is generally  detectable in upper and lower respiratory sp ecimens during the acute  phase of infection. The expected result is Negative. Fact Sheet for Patients:  StrictlyIdeas.no Fact Sheet for Healthcare Providers: BankingDealers.co.za This test is not yet approved or cleared by the [place] FDA and has been authorized for detection and/or diagnosis of SARS-CoV-2 by FDA under an Emergency Use Authorization (EUA).  This EUA will remain in effect (meaning this test can be used) for the duration of the COVID-19 declaration under Section 564(b)(1) of the Act, 21 U.S.C. section 360bbb-3(b)(1), unless the authorization is terminated or revoked sooner. Performed at [facility], [place] [address] [place], [place] [address]   Urine culture     Status: None   Collection Time: 05/07/19  7:59 PM   Specimen: Urine, Random  Result Value Ref Range Status   Specimen Description   Final    URINE, RANDOM Performed at [place] [address] [place], [place] [address]    Special Requests   Final    NONE Performed at [facility], [place] [address], [place], [place] [address]    Culture   Final    NO GROWTH Performed at [facility], [place] [address], [place], [place] [address]    Report Status 05/09/2019 FINAL  Final  MRSA PCR Screening     Status: None   Collection Time: 05/07/19 11:17 PM   Specimen: Nasal Mucosa; Nasopharyngeal  Result Value Ref Range Status   MRSA by PCR NEGATIVE NEGATIVE Final    Comment:        The GeneXpert MRSA Assay (FDA approved  for NASAL specimens only), is one component of a comprehensive MRSA colonization surveillance program. It is not intended to diagnose MRSA infection nor to guide or monitor treatment for MRSA infections. Performed at [facility], [place] [address] [place], [place] [address]      Labs: BNP (last 3 results) No results for input(s): BNP in the last 8760 hours. Basic Metabolic Panel: Recent Labs  Lab 05/07/19 2150  05/10/19 0421 05/11/19 0416 05/12/19 0620 05/13/19 1341 05/14/19 1022  NA  --    < > 141 141 141 141 138  K  --    < > 3.5 3.6 3.4* 3.5 3.0*  CL  --    < >  105 106 108 108 107  CO2  --    < > 23 23 22 23 24   GLUCOSE  --    < > 95 94 81 101* 108*  BUN  --    < > 10 13 16 17 17   CREATININE  --    < > 0.99 1.44* 1.20* 0.86 0.80  CALCIUM  --    < > 9.0 8.9 8.5* 8.6* 8.3*  MG 1.4*  --   --   --   --   --  1.5*   < > = values in this interval not displayed.   Liver Function Tests: Recent Labs  Lab 05/07/19 1706 05/13/19 1341 05/14/19 1022  AST 15 12* 12*  ALT 9 9 10   ALKPHOS 39 43 42  BILITOT 0.5 0.9 0.6  PROT 6.5 6.6 6.4*  ALBUMIN 3.8 3.5 3.2*   Recent Labs  Lab 05/07/19 1706  LIPASE 22   No results for input(s): AMMONIA in the last 168 hours. CBC: Recent Labs  Lab 05/07/19 1706  05/10/19 0421 05/11/19 0416 05/12/19 0620 05/13/19 1341 05/14/19 1022  WBC 4.0   < > 5.9 6.1 6.2 6.6 5.2  NEUTROABS 2.6  --   --   --   --  5.3  --   HGB 8.7*   < > 9.6* 9.2* 8.0* 7.8* 7.7*  HCT 25.7*   < > 29.4* 28.5* 24.7* 24.5* 23.3*  MCV 109.4*   < > 112.2* 111.8* 110.8* 109.9* 108.9*  PLT 137*   < > 175 152 131* 143* 129*   < > = values in this interval not displayed.   Cardiac Enzymes: Recent Labs  Lab 05/07/19 1706 05/07/19 2150 05/08/19 0051 05/08/19 0830  TROPONINI <0.03 <0.03 <0.03 <0.03   BNP: Invalid input(s): POCBNP CBG: Recent Labs  Lab 05/09/19 1722  GLUCAP 87   D-Dimer No results for input(s): DDIMER in the last 72  hours. Hgb A1c No results for input(s): HGBA1C in the last 72 hours. Lipid Profile No results for input(s): CHOL, HDL, LDLCALC, TRIG, CHOLHDL, LDLDIRECT in the last 72 hours. Thyroid function studies No results for input(s): TSH, T4TOTAL, T3FREE, THYROIDAB in the last 72 hours.  Invalid input(s): FREET3 Anemia work up No results for input(s): VITAMINB12, FOLATE, FERRITIN, TIBC, IRON, RETICCTPCT in the last 72 hours. Urinalysis    Component Value Date/Time   COLORURINE YELLOW 05/07/2019 1959   APPEARANCEUR CLEAR 05/07/2019 1959   LABSPEC 1.018 05/07/2019 1959   PHURINE 6.0 05/07/2019 1959   GLUCOSEU NEGATIVE 05/07/2019 1959   HGBUR NEGATIVE 05/07/2019 1959   BILIRUBINUR NEGATIVE 05/07/2019 1959   KETONESUR 5 (A) 05/07/2019 1959   PROTEINUR NEGATIVE 05/07/2019 1959   UROBILINOGEN 0.2 03/10/2014 1027   NITRITE NEGATIVE 05/07/2019 1959   LEUKOCYTESUR NEGATIVE 05/07/2019 1959   Sepsis Labs Invalid input(s): PROCALCITONIN,  WBC,  [facility] Microbiology Recent Results (from the past 240 hour(s))  SARS Coronavirus 2 (CEPHEID - Performed in [place] hospital lab), Hosp Order     Status: None   Collection Time: 05/07/19  5:32 PM   Specimen: Nasopharyngeal Swab  Result Value Ref Range Status   SARS Coronavirus 2 NEGATIVE NEGATIVE Final    Comment: (NOTE) If result is NEGATIVE SARS-CoV-2 target nucleic acids are NOT DETECTED. The SARS-CoV-2 RNA is generally detectable in upper and lower  respiratory specimens during the acute phase of infection. The lowest  concentration of SARS-CoV-2 viral copies this assay can detect is 250  copies / mL. A  negative result does not preclude SARS-CoV-2 infection  and should not be used as the sole basis for treatment or other  patient management decisions.  A negative result may occur with  improper specimen collection / handling, submission of specimen other  than nasopharyngeal swab, presence of viral mutation(s) within the  areas targeted  by this assay, and inadequate number of viral copies  (<250 copies / mL). A negative result must be combined with clinical  observations, patient history, and epidemiological information. If result is POSITIVE SARS-CoV-2 target nucleic acids are DETECTED. The SARS-CoV-2 RNA is generally detectable in upper and lower  respiratory specimens dur ing the acute phase of infection.  Positive  results are indicative of active infection with SARS-CoV-2.  Clinical  correlation with patient history and other diagnostic information is  necessary to determine patient infection status.  Positive results do  not rule out bacterial infection or co-infection with other viruses. If result is PRESUMPTIVE POSTIVE SARS-CoV-2 nucleic acids MAY BE PRESENT.   A presumptive positive result was obtained on the submitted specimen  and confirmed on repeat testing.  While 2019 novel coronavirus  (SARS-CoV-2) nucleic acids may be present in the submitted sample  additional confirmatory testing may be necessary for epidemiological  and / or clinical management purposes  to differentiate between  SARS-CoV-2 and other Sarbecovirus currently known to infect humans.  If clinically indicated additional testing with an alternate test  methodology [phone]) is advised. The SARS-CoV-2 RNA is generally  detectable in upper and lower respiratory sp ecimens during the acute  phase of infection. The expected result is Negative. Fact Sheet for Patients:  StrictlyIdeas.no Fact Sheet for Healthcare Providers: BankingDealers.co.za This test is not yet approved or cleared by the [place] FDA and has been authorized for detection and/or diagnosis of SARS-CoV-2 by FDA under an Emergency Use Authorization (EUA).  This EUA will remain in effect (meaning this test can be used) for the duration of the COVID-19 declaration under Section 564(b)(1) of the Act, 21 U.S.C. section  360bbb-3(b)(1), unless the authorization is terminated or revoked sooner. Performed at [facility], [place] [address], [place], [place] [address]   Urine culture     Status: None   Collection Time: 05/07/19  7:59 PM   Specimen: Urine, Random  Result Value Ref Range Status   Specimen Description   Final    URINE, RANDOM Performed at [place] [address], [place], [place] [address]    Special Requests   Final    NONE Performed at [facility], [place] [address] [place], [place] [address]    Culture   Final    NO GROWTH Performed at [facility], [place] [address], [place], [place] [address]    Report Status 05/09/2019 FINAL  Final  MRSA PCR Screening     Status: None   Collection Time: 05/07/19 11:17 PM   Specimen: Nasal Mucosa; Nasopharyngeal  Result Value Ref Range Status   MRSA by PCR NEGATIVE NEGATIVE Final    Comment:        The GeneXpert MRSA Assay (FDA approved for NASAL specimens only), is one component of a comprehensive MRSA colonization surveillance program. It is not intended to diagnose MRSA infection nor to guide or monitor treatment for MRSA infections. Performed at [facility], [place] [address], [place], [place] [address]      Time coordinating discharge: 45 minutes   SIGNED:   Darliss Cheney, MD  [facility] 05/14/2019, 12:28 PM Pager [phone]  If 7PM-7AM, please contact night-coverage [email or web address] Password [email or web address]

## 2019-05-14 NOTE — Progress Notes (Signed)
PROGRESS NOTE    Rebekah Henderson  WJX:[id number] DOB: [birth year] DOA: 05/07/2019 PCP: Redmond School, MD   Brief Narrative:  Rebekah Henderson a 63 y.o.femalewith medical history significant ofsmall cell lung cancer on chemo, COPD, GERD, hypertension, hypothyroidism, IBS, and conditions listed below presenting to the hospital for evaluation of emesis. Patient was recently admitted on June 1 for ESBL UTI/pyelonephritis and treated with meropenem. She was converted to ertapenem on June 3 and discharged home for ongoing dosages until June 8.  Patient states she has been receiving chemotherapy for several years and has had intermittent nauseaand vomitingfor several years as well. States today she vomited after drinking ginger ale. She is also been having left lower quadrant abdominal pain for several years. Also reports having intermittent diarrhea for several years, most recent episode 2 days ago. States she has been seen at [facility] and another outside hospital and was told her symptoms were due to IBS, acid reflux, and fibromyalgia. She is also complaining of some pain in her lower sternal and upper abdominal region which she describes as a gurgling sensation. Also reports having acid reflux. States for the past few days she has been having sharp left-sided chest pain. Chest pain is nonexertional. Reports having chronic shortness of breath, no recent change. She uses 2 L home oxygen all the time.  Troponin x3 were checked and negative.  In the ED, work up revealed chest x-ray showed total opacification of the left hemithorax, concerning for large left pleural effusion and/or left mainstem bronchial obstruction by hilar mass. Chest CT showed slight interval increase in size of the left hilar and mediastinal mass about the left mainstem bronchus, with increased mass-effect on the left mainstem bronchus. Near-total atelectasis of the left lung secondary to left mainstem bronchial obstruction  with a small pleural effusion. Also showing slightly increased infectious or inflammatory groundglass opacity of the right upper lobe.  She underwent bronchoscopy on 6/8.  Consultants:   Oncology  Radiation oncology  PCCM  Procedures:   Bronchoscopy on 05/10/2019  Antimicrobials:   None  Subjective: Patient seen and examined earlier today.  She was completely alert and oriented at that point in time.  She was taken off of the restraints.  She had no complaints.  Objective: Vitals:   05/13/19 1318 05/13/19 2112 05/14/19 0701 05/14/19 1329  BP: (!) 155/77 128/90 133/73 114/68  Pulse: 84 85 83 79  Resp: 16 20 16 16   Temp: 98.2 F (36.8 C)  97.9 F (36.6 C) 97.9 F (36.6 C)  TempSrc: Oral  Oral Oral  SpO2: 94% 97% 100% 98%  Weight:      Height:        Intake/Output Summary (Last 24 hours) at 05/14/2019 1717 Last data filed at 05/14/2019 1254 Gross per 24 hour  Intake 957 ml  Output 1700 ml  Net -743 ml   Filed Weights   05/07/19 1643  Weight: 68 kg    Examination: General exam: Appears calm and comfortable  Respiratory system: Clear to auscultation. Respiratory effort normal. Cardiovascular system: S1 & S2 heard, RRR. No JVD, murmurs, rubs, gallops or clicks. No pedal edema. Gastrointestinal system: Abdomen is nondistended, soft and nontender. No organomegaly or masses felt. Normal bowel sounds heard. Central nervous system: Alert and oriented. No focal neurological deficits. Extremities: Symmetric 5 x 5 power. Skin: No rashes, lesions or ulcers Psychiatry: Judgement and insight appear normal. Mood & affect appropriate.    Data Reviewed: I have personally reviewed following labs and  imaging studies  CBC: Recent Labs  Lab 05/10/19 0421 05/11/19 0416 05/12/19 0620 05/13/19 1341 05/14/19 1022  WBC 5.9 6.1 6.2 6.6 5.2  NEUTROABS  --   --   --  5.3  --   HGB 9.6* 9.2* 8.0* 7.8* 7.7*  HCT 29.4* 28.5* 24.7* 24.5* 23.3*  MCV 112.2* 111.8* 110.8* 109.9*  108.9*  PLT 175 152 131* 143* 224*   Basic Metabolic Panel: Recent Labs  Lab 05/07/19 2150  05/10/19 0421 05/11/19 0416 05/12/19 0620 05/13/19 1341 05/14/19 1022  NA  --    < > 141 141 141 141 138  K  --    < > 3.5 3.6 3.4* 3.5 3.0*  CL  --    < > 105 106 108 108 107  CO2  --    < > 23 23 22 23 24   GLUCOSE  --    < > 95 94 81 101* 108*  BUN  --    < > 10 13 16 17 17   CREATININE  --    < > 0.99 1.44* 1.20* 0.86 0.80  CALCIUM  --    < > 9.0 8.9 8.5* 8.6* 8.3*  MG 1.4*  --   --   --   --   --  1.5*   < > = values in this interval not displayed.   GFR: Estimated Creatinine Clearance: 66.4 mL/min (by C-G formula based on SCr of 0.8 mg/dL). Liver Function Tests: Recent Labs  Lab 05/13/19 1341 05/14/19 1022  AST 12* 12*  ALT 9 10  ALKPHOS 43 42  BILITOT 0.9 0.6  PROT 6.6 6.4*  ALBUMIN 3.5 3.2*   No results for input(s): LIPASE, AMYLASE in the last 168 hours. No results for input(s): AMMONIA in the last 168 hours. Coagulation Profile: No results for input(s): INR, PROTIME in the last 168 hours. Cardiac Enzymes: Recent Labs  Lab 05/07/19 2150 05/08/19 0051 05/08/19 0830  TROPONINI <0.03 <0.03 <0.03   BNP (last 3 results) No results for input(s): PROBNP in the last 8760 hours. HbA1C: No results for input(s): HGBA1C in the last 72 hours. CBG: Recent Labs  Lab 05/09/19 1722  GLUCAP 87   Lipid Profile: No results for input(s): CHOL, HDL, LDLCALC, TRIG, CHOLHDL, LDLDIRECT in the last 72 hours. Thyroid Function Tests: No results for input(s): TSH, T4TOTAL, FREET4, T3FREE, THYROIDAB in the last 72 hours. Anemia Panel: No results for input(s): VITAMINB12, FOLATE, FERRITIN, TIBC, IRON, RETICCTPCT in the last 72 hours. Sepsis Labs: Recent Labs  Lab 05/07/19 2150  PROCALCITON 0.10    Recent Results (from the past 240 hour(s))  SARS Coronavirus 2 (CEPHEID - Performed in [place] hospital lab), Hosp Order     Status: None   Collection Time: 05/07/19  5:32 PM    Specimen: Nasopharyngeal Swab  Result Value Ref Range Status   SARS Coronavirus 2 NEGATIVE NEGATIVE Final    Comment: (NOTE) If result is NEGATIVE SARS-CoV-2 target nucleic acids are NOT DETECTED. The SARS-CoV-2 RNA is generally detectable in upper and lower  respiratory specimens during the acute phase of infection. The lowest  concentration of SARS-CoV-2 viral copies this assay can detect is 250  copies / mL. A negative result does not preclude SARS-CoV-2 infection  and should not be used as the sole basis for treatment or other  patient management decisions.  A negative result may occur with  improper specimen collection / handling, submission of specimen other  than nasopharyngeal swab, presence of viral  mutation(s) within the  areas targeted by this assay, and inadequate number of viral copies  (<250 copies / mL). A negative result must be combined with clinical  observations, patient history, and epidemiological information. If result is POSITIVE SARS-CoV-2 target nucleic acids are DETECTED. The SARS-CoV-2 RNA is generally detectable in upper and lower  respiratory specimens dur ing the acute phase of infection.  Positive  results are indicative of active infection with SARS-CoV-2.  Clinical  correlation with patient history and other diagnostic information is  necessary to determine patient infection status.  Positive results do  not rule out bacterial infection or co-infection with other viruses. If result is PRESUMPTIVE POSTIVE SARS-CoV-2 nucleic acids MAY BE PRESENT.   A presumptive positive result was obtained on the submitted specimen  and confirmed on repeat testing.  While 2019 novel coronavirus  (SARS-CoV-2) nucleic acids may be present in the submitted sample  additional confirmatory testing may be necessary for epidemiological  and / or clinical management purposes  to differentiate between  SARS-CoV-2 and other Sarbecovirus currently known to infect humans.  If  clinically indicated additional testing with an alternate test  methodology [phone]) is advised. The SARS-CoV-2 RNA is generally  detectable in upper and lower respiratory sp ecimens during the acute  phase of infection. The expected result is Negative. Fact Sheet for Patients:  StrictlyIdeas.no Fact Sheet for Healthcare Providers: BankingDealers.co.za This test is not yet approved or cleared by the [place] FDA and has been authorized for detection and/or diagnosis of SARS-CoV-2 by FDA under an Emergency Use Authorization (EUA).  This EUA will remain in effect (meaning this test can be used) for the duration of the COVID-19 declaration under Section 564(b)(1) of the Act, 21 U.S.C. section 360bbb-3(b)(1), unless the authorization is terminated or revoked sooner. Performed at [facility], [place] [address] [place], [place] [address]   Urine culture     Status: None   Collection Time: 05/07/19  7:59 PM   Specimen: Urine, Random  Result Value Ref Range Status   Specimen Description   Final    URINE, RANDOM Performed at [place] [address], [place], [place] [address]    Special Requests   Final    NONE Performed at [facility], [place] [address], [place], [place] [address]    Culture   Final    NO GROWTH Performed at [facility], [place] [address] [place], [place] [address]    Report Status 05/09/2019 FINAL  Final  MRSA PCR Screening     Status: None   Collection Time: 05/07/19 11:17 PM   Specimen: Nasal Mucosa; Nasopharyngeal  Result Value Ref Range Status   MRSA by PCR NEGATIVE NEGATIVE Final    Comment:        The GeneXpert MRSA Assay (FDA approved for NASAL specimens only), is one component of a comprehensive MRSA colonization surveillance program. It is not intended to diagnose MRSA infection nor to guide or monitor treatment for MRSA  infections. Performed at [facility], [place] [address], [place], [place] [address]       Radiology Studies: No results found.  Scheduled Meds:  amLODipine  5 mg Oral Daily   Chlorhexidine Gluconate Cloth  6 each Topical Daily   enoxaparin (LOVENOX) injection  40 mg Subcutaneous Q24H   estradiol  2 mg Oral Daily   FLUoxetine  40 mg Oral Daily   levothyroxine  50 mcg Oral Q0600  magnesium oxide  400 mg Oral BID   mouth rinse  15 mL Mouth Rinse BID   metoprolol succinate  25 mg Oral Daily   pantoprazole  40 mg Oral Daily   potassium chloride  40 mEq Oral TID   QUEtiapine  25 mg Oral QHS   sodium chloride flush  10-40 mL Intracatheter Q12H   Continuous Infusions:  sodium chloride 50 mL/hr at 05/14/19 1157     LOS: 7 days   Assessment & Plan:   Principal Problem:   Lung collapse Active Problems:   Small cell lung cancer, left (HCC)   Chronic abdominal pain   Chronic nausea   Chronic diarrhea   Delirium due to medical condition without behavioral disturbance   AKI (acute kidney injury) ([place])  Lung collapse secondary to 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  Chest CT done at the time of admission showed interval increase in the size of the left hilar mediastinal mass with increased mass-effect on the left mainstem bronchus.  Patient underwent bronchoscopy by PCCM on 05/10/2019 which showed diffuse studding of left mainstem bronchus with airway narrowing.  CT chest also showed slightly increased infectious versus inflammatory groundglass opacity but her procalcitonin is unremarkable so no antibiotics were given.  She is tested negative for COVID-19.  Radiation oncology on board And she has received total of 4 doses of radiation so far.  Delirium:  Multifactorial.  She is much better today.  Continue to hold any benzodiazepines.  Continue on Seroquel and rest of the measures as we have been doing.  Acute kidney injury: Her baseline creatinine is around 1-1.1 and she was 1.2 yesterday.  Today's labs are pending.  Atypical chest pain: No more pain.  Troponins negative..  Recent ESBL UTI: Pleated course of ertapenem on 05/10/2019.  QTC prolongation on EKG: Closely and avoid QTC prolonging drugs.  COPD: Stable and not in exacerbation.  Continue current management.  Hypertension: Controlled.  Continue Toprol XL and Norvasc.  Hypothyroidism: Continue Synthroid.  Depression and anxiety: Tinea Prozac but discontinue Xanax for now.  DVT prophylaxis: Lovenox Code Status: Full code Family Communication: Called and discussed with Shea Stakes earlier today and she reiterated that she will provide 24-hour assistance to the patient and was willing to take the patient.  Patient was discharged as well with the all the discharge orders and summary done however when nurses try to get her up for her radiation, she was requiring 2 assist and was not able to stand up on her own.  This was conveyed to [place] by nurses who then changed her mind and requested finding SNF for the patient.  Social worker was involved.  Discharge canceled and patient is staying until assessed by PT and if needed then finding placement.  I called [name] again at 5:26 PM and updated her about the status. Disposition Plan: Physical therapy was reconsulted to see patient as soon as possible for imminent discharge at around 1 PM.  She has not been seen by them as of the time this note is being signed.  Discharge pending assessment by physical therapy.   Time spent: 45 minutes.   Darliss Cheney, MD [facility] Pager [phone]  If 7PM-7AM, please contact night-coverage [email or web address] Password [email or web address] 05/14/2019, 5:17 PM

## 2019-05-14 NOTE — TOC Progression Note (Addendum)
Transition of Care [facility]) - Progression Note    Patient Details  Name: Rebekah Henderson MRN: [id number] Date of Birth: [birth year]  Transition of Care [facility]) CM/SW Contact  Servando Snare, [place] Phone Number: 05/14/2019, 1:49 PM  Clinical Narrative:  LCSW notified at dc by floor RN that patient is requiring more assistance and may need SNF level of care. Patient worked up for SNF.       2:27 PM Patient chart indicated that she has been in restraints. LCSW notified floor RN. Patient must be restraint free for 24 hours before she can dc to a facility.    Expected Discharge Plan: [place] Barriers to Discharge: No Barriers Identified  Expected Discharge Plan and Services Expected Discharge Plan: [place]   Discharge Planning Services: CM Consult Post Acute Care Choice: Home Health, Durable Medical Equipment Living arrangements for the past 2 months: Single Family Home Expected Discharge Date: 05/14/19               DME Arranged: Gilford Rile rolling, 3-N-1 DME Agency: [employer] Date DME Agency Contacted: 05/13/19 Time DME Agency Contacted: 0900 Representative spoke with at DME Agency: nathan HH Arranged: RN, PT [place] Agency: Susan Moore ([place]) Date [place]: 05/13/19 Time [place]: 1532 Representative spoke with at [place]: [place] ([place]) Interventions    Readmission Risk Interventions Readmission Risk Prevention Plan 05/05/2019  Transportation Screening Complete  PCP or Specialist Appt within 3-5 Days Complete  HRI or [place] Complete  Social Work Consult for [place] Planning/Counseling [place] Not Applicable  Medication Review Press photographer) Complete  Some recent data might be hidden

## 2019-05-14 NOTE — Discharge Instructions (Signed)
External Beam Radiation Therapy, Care After This sheet gives you information about how to care for yourself after your procedure. Your health care provider may also give you more specific instructions. If you have problems or questions, contact your health care provider. What can I expect after the procedure? After the procedure, it is common to have:  Fatigue.  Red, flaking, dry skin in the treated area.  A sunburn-like rash on the skin in the treated area.  Hair loss in the treated area.  Itching in the treated area. Other side effects may occur, depending on which part of the body was exposed to radiation and how much radiation was used. These may include:  Hair loss if the radiation therapy was directed to the head.  Coughing or difficulty swallowing if the radiation therapy was directed to the head, neck, or chest  Nausea, vomiting, or diarrhea if the radiation therapy was directed to the abdomen or pelvis.  Bladder problems, frequent urination, or sexual dysfunction if the radiation therapy was directed to the bladder, kidney, or prostate.  Memory loss and cognitive changes if the radiation therapy was directed to your brain. Although some side effects may show up months to years later, most side effects are usually temporary and get better over time. It can take up to 3-4 weeks for you to regain your energy or for side effects to get better. Follow these instructions at home:  Skin care  Wash your skin with a mild soap as told by your health care provider. Do not scrub or rub your skin. Pat yourself dry.  Use a mild shampoo and be gentle when washing your hair.  Apply gentle lotion or cream to the treated area as told by your health care provider.  Keep the treated area covered when you are outside. Do not expose treated skin to the sun.  Avoid scratching the treated area. General instructions  Do not use a heating pad or a warm cloth to relieve pain in the treated  area.  Take over-the-counter and prescription medicines only as told by your health care provider.  Follow your health care provider's advice on the type and amount of liquids to drink each day.  Try to maintain your weight during treatment. Ask your health care team for tips.  Keep all follow-up visits as told by your health care provider. This is important. The visits are usually scheduled 6 weeks to 6 months after radiation therapy. They are needed to determine if the radiation therapy worked as it was intended to. Contact a health care provider if:  You have pain in the treated area.  The redness worsens in the treated area.  Open skin or blisters develop in the treated area.  You have unexplained weight loss. Get help right away if:  You have a fever.  You have nausea or vomiting that lasts a long time.  You have diarrhea that lasts a long time. Summary  After this procedure, it is common to have fatigue, skin changes and other side effects depending on where the radiation therapy was given.  Although some side effects may show up months to years later, most side effects are usually temporary and get better over time. It can take up to 3-4 weeks for you to regain your energy or for side effects to get better.  Keep all follow-up visits as told by your health care provider. This is important. The visits are usually scheduled 6 weeks to 6 months after radiation therapy.  This information is not intended to replace advice given to you by your health care provider. Make sure you discuss any questions you have with your health care provider. Document Released: 11/23/2013 Document Revised: 10/23/2016 Document Reviewed: 10/23/2016 Elsevier Interactive Patient Education  [employer].

## 2019-05-15 LAB — CBC WITH DIFFERENTIAL/PLATELET
Abs Immature Granulocytes: 0.04 10*3/uL (ref 0.00–0.07)
Basophils Absolute: 0 10*3/uL (ref 0.0–0.1)
Basophils Relative: 0 %
Eosinophils Absolute: 0.1 10*3/uL (ref 0.0–0.5)
Eosinophils Relative: 1 %
HCT: 23.7 % — ABNORMAL LOW (ref 36.0–46.0)
Hemoglobin: 7.8 g/dL — ABNORMAL LOW (ref 12.0–15.0)
Immature Granulocytes: 1 %
Lymphocytes Relative: 14 %
Lymphs Abs: 0.7 10*3/uL (ref 0.7–4.0)
MCH: 35.6 pg — ABNORMAL HIGH (ref 26.0–34.0)
MCHC: 32.9 g/dL (ref 30.0–36.0)
MCV: 108.2 fL — ABNORMAL HIGH (ref 80.0–100.0)
Monocytes Absolute: 0.5 10*3/uL (ref 0.1–1.0)
Monocytes Relative: 10 %
Neutro Abs: 3.8 10*3/uL (ref 1.7–7.7)
Neutrophils Relative %: 74 %
Platelets: 151 10*3/uL (ref 150–400)
RBC: 2.19 MIL/uL — ABNORMAL LOW (ref 3.87–5.11)
RDW: 14.3 % (ref 11.5–15.5)
WBC: 5 10*3/uL (ref 4.0–10.5)
nRBC: 0 % (ref 0.0–0.2)

## 2019-05-15 LAB — COMPREHENSIVE METABOLIC PANEL
ALT: 11 U/L (ref 0–44)
AST: 13 U/L — ABNORMAL LOW (ref 15–41)
Albumin: 3.2 g/dL — ABNORMAL LOW (ref 3.5–5.0)
Alkaline Phosphatase: 41 U/L (ref 38–126)
Anion gap: 11 (ref 5–15)
BUN: 14 mg/dL (ref 8–23)
CO2: 22 mmol/L (ref 22–32)
Calcium: 8.5 mg/dL — ABNORMAL LOW (ref 8.9–10.3)
Chloride: 107 mmol/L (ref 98–111)
Creatinine, Ser: 0.83 mg/dL (ref 0.44–1.00)
GFR calc Af Amer: >60 mL/min (ref >60)
GFR calc non Af Amer: >60 mL/min (ref >60)
Glucose, Bld: 115 mg/dL — ABNORMAL HIGH (ref 70–99)
Potassium: 3.5 mmol/L (ref 3.5–5.1)
Sodium: 140 mmol/L (ref 135–145)
Total Bilirubin: 0.5 mg/dL (ref 0.3–1.2)
Total Protein: 6.3 g/dL — ABNORMAL LOW (ref 6.5–8.1)

## 2019-05-15 LAB — MAGNESIUM: Magnesium: 1.7 mg/dL (ref 1.7–2.4)

## 2019-05-15 NOTE — Progress Notes (Signed)
Physical Therapy Treatment Patient Details Name: Rebekah Henderson MRN: [id number] DOB: 05/03/1956 Today's Date: 05/15/2019    History of Present Illness 63 yo female admitted to ED on 6/5 with N/V, UTI, lung cancer with lung collapse secondary to mainstem bronchial obstruction from lung mass in setting of small cell carcinoma. Pt with delirium as well. PMH includes lung cancer with history of and ongoing chemo/radiation, anxiety, OA, COPD, diverticulitis, fibromyalgia, GERD, HTN, IBS, PNA, cervical fusion 2013.    PT Comments    Pt with significant confusion this session, perseverating about being on a boat today and last night, and about needing to turn the engine off. Pt appears to drift in and out of orientation, as she once stated she was at [employer]. See "cognition" section below for full details. Pt lacking all safety awareness and requires signficant assist in sitting and standing.  Pt required mod-max assist +2 for all mobility tasks today, and presented with significant LE buckling when standing >30 seconds. PT still strongly recommending SNF given pt's cognitive and physical status. PT to continue to follow acutely.    Follow Up Recommendations  SNF;Supervision/Assistance - 24 hour     Equipment Recommendations  None recommended by PT    Recommendations for Other Services       Precautions / Restrictions Precautions Precautions: Fall Restrictions Weight Bearing Restrictions: No    Mobility  Bed Mobility Overal bed mobility: Needs Assistance Bed Mobility: Supine to Sit;Sit to Supine     Supine to sit: +2 for safety/equipment;Mod assist;+2 for physical assistance Sit to supine: +2 for physical assistance;+2 for safety/equipment;Mod assist   General bed mobility comments: Mod assist +2 for supine<>sit for trunk and LE management, scooting to and from EOB. Verbal cuing provided throughout for sequencing.  Transfers Overall transfer level: Needs assistance Equipment  used: Rolling walker (2 wheeled) Transfers: Sit to/from [facility] Sit to Stand: Mod assist;From elevated surface;+2 physical assistance Stand pivot transfers: Mod assist;+2 safety/equipment;+2 physical assistance       General transfer comment: Mod assist for sit to stand for power up, steadying, hand placement on RW. VC for hip extension to come to standing. Pt transferred from bed to [facility] due to urinary incotinence in standing, pt with very unsafe use of RW requiring signifcant assist for steadying, management of RW, and slow lowering onto BSC. Pt with LE buckling with >30 seconds standing.  x3 sit to stand trials during session.  Ambulation/Gait Ambulation/Gait assistance: (NT)               Stairs             Wheelchair Mobility    Modified Rankin (Stroke Patients Only)       Balance Overall balance assessment: Needs assistance Sitting-balance support: Bilateral upper extremity supported;Feet supported Sitting balance-Leahy Scale: Fair Sitting balance - Comments: able to sit EOB without PT support this session, requires assist for setup   Standing balance support: Bilateral upper extremity supported(NT) Standing balance-Leahy Scale: Poor Standing balance comment: heavy assist from PT and PT aide in standing, cannot stand without external support                            Cognition Arousal/Alertness: Awake/alert Behavior During Therapy: Anxious;Restless Overall Cognitive Status: Impaired/Different from baseline Area of Impairment: Orientation;Attention;Memory;Following commands;Safety/judgement;Problem solving                 Orientation Level: Disoriented to;Place;Situation;Time Current Attention  Level: Sustained Memory: Decreased short-term memory Following Commands: Follows one step commands inconsistently Safety/Judgement: Decreased awareness of safety;Decreased awareness of deficits   Problem Solving: Slow  processing;Decreased initiation;Difficulty sequencing;Requires verbal cues;Requires tactile cues General Comments: Pt stating she was at home, on a boat, then said she was at [facility]. Pt telling PT that she went on the boat yesterday and she needed to turn to engine off, states they will charge her $500 to keep the engine running on the boat. PT states "you are in the hospital in your bed" and pt states "I know that, I am at [facility]". Pt speaking of a woman named [name], said she was just here and she needed to tell me about her. Pt very unsafe with mobility, attempts to stand without assist when asked to stop.      Exercises General Exercises - Lower Extremity Hip Flexion/Marching: AROM;Both;5 reps;Standing    General Comments General comments (skin integrity, edema, etc.): 2LO2 during session, sats at 87% after transfer with recovery to >90% with verbal instructions for breathing technique.      Pertinent Vitals/Pain Pain Assessment: No/denies pain Pain Intervention(s): Monitored during session;Limited activity within patient's tolerance    Home Living                      Prior Function            PT Goals (current goals can now be found in the care plan section) Progress towards PT goals: Progressing toward goals    Frequency    Min 2X/week      PT Plan Current plan remains appropriate    Co-evaluation              AM-PAC PT "6 Clicks" Mobility   Outcome Measure  Help needed turning from your back to your side while in a flat bed without using bedrails?: A Lot Help needed moving from lying on your back to sitting on the side of a flat bed without using bedrails?: A Lot Help needed moving to and from a bed to a chair (including a wheelchair)?: Total Help needed standing up from a chair using your arms (e.g., wheelchair or bedside chair)?: Total Help needed to walk in hospital room?: Total Help needed climbing 3-5 steps with a railing? : Total 6 Click  Score: 8    End of Session Equipment Utilized During Treatment: Oxygen;Gait belt Activity Tolerance: Patient limited by fatigue Patient left: in bed;with call bell/phone within reach;with bed alarm set;Other (comment)(with mittens reapplied, on when PT arrived to room) Nurse Communication: Mobility status PT Visit Diagnosis: Muscle weakness (generalized) (M62.81);Other abnormalities of gait and mobility (R26.89)     Time: 1000-1028 PT Time Calculation (min) (ACUTE ONLY): 28 min  Charges:  $Therapeutic Activity: 23-37 mins                     Julien Girt, PT Acute Rehabilitation Services Pager [phone]  Office [phone]   Delena Casebeer D Elonda Husky 05/15/2019, 12:16 PM

## 2019-05-15 NOTE — Progress Notes (Addendum)
PROGRESS NOTE    Rebekah Henderson  CHY:[id number] DOB: 03-Apr-1956 DOA: 05/07/2019 PCP: Redmond School, MD   Brief Narrative:  Rebekah Henderson a 63 y.o.femalewith medical history significant ofsmall cell lung cancer on chemo, COPD, GERD, hypertension, hypothyroidism, IBS, and conditions listed below presenting to the hospital for evaluation of emesis. Patient was recently admitted on June 1 for ESBL UTI/pyelonephritis and treated with meropenem. She was converted to ertapenem on June 3 and discharged home for ongoing dosages until June 8.  Patient states she has been receiving chemotherapy for several years and has had intermittent nauseaand vomitingfor several years as well. States today she vomited after drinking ginger ale. She is also been having left lower quadrant abdominal pain for several years. Also reports having intermittent diarrhea for several years, most recent episode 2 days ago. States she has been seen at [facility] and another outside hospital and was told her symptoms were due to IBS, acid reflux, and fibromyalgia. She is also complaining of some pain in her lower sternal and upper abdominal region which she describes as a gurgling sensation. Also reports having acid reflux. States for the past few days she has been having sharp left-sided chest pain. Chest pain is nonexertional. Reports having chronic shortness of breath, no recent change. She uses 2 L home oxygen all the time.  Troponin x3 were checked and negative.  In the ED, work up revealed chest x-ray showed total opacification of the left hemithorax, concerning for large left pleural effusion and/or left mainstem bronchial obstruction by hilar mass. Chest CT showed slight interval increase in size of the left hilar and mediastinal mass about the left mainstem bronchus, with increased mass-effect on the left mainstem bronchus. Near-total atelectasis of the left lung secondary to left mainstem bronchial obstruction  with a small pleural effusion. Also showing slightly increased infectious or inflammatory groundglass opacity of the right upper lobe.  She underwent bronchoscopy on 6/8.  She was started on radiation therapy on Tuesday 6/10/ 2020.  Consultants:   Oncology  Radiation oncology  PCCM  Procedures:   Bronchoscopy on 05/10/2019  Antimicrobials:   None  Subjective: Patient seen and examined.  She was in mittens but not in restraints.  She was alert and oriented like she was yesterday.  She had no complaint.  Objective: Vitals:   05/14/19 1932 05/15/19 0613 05/15/19 0734 05/15/19 1304  BP: 140/73 (!) 148/85  128/78  Pulse: 88 (!) 115 (!) 101 (!) 101  Resp: 18 18  16   Temp: 98.3 F (36.8 C) 98.2 F (36.8 C)  97.9 F (36.6 C)  TempSrc: Oral Oral  Oral  SpO2: 98% 100%  97%  Weight:      Height:        Intake/Output Summary (Last 24 hours) at 05/15/2019 1321 Last data filed at 05/15/2019 0749 Gross per 24 hour  Intake 500 ml  Output 300 ml  Net 200 ml   Filed Weights   05/07/19 1643  Weight: 68 kg    Examination:  General exam: Appears calm and comfortable  Respiratory system: Clear to auscultation. Respiratory effort normal. Cardiovascular system: S1 & S2 heard, RRR. No JVD, murmurs, rubs, gallops or clicks. No pedal edema. Gastrointestinal system: Abdomen is nondistended, soft and nontender. No organomegaly or masses felt. Normal bowel sounds heard. Central nervous system: Alert and oriented. No focal neurological deficits. Extremities: Symmetric 5 x 5 power. Skin: No rashes, lesions or ulcers Psychiatry: Judgement and insight poor,. Mood & affect appropriate.  Data Reviewed: I have personally reviewed following labs and imaging studies  CBC: Recent Labs  Lab 05/11/19 0416 05/12/19 0620 05/13/19 1341 05/14/19 1022 05/15/19 0833  WBC 6.1 6.2 6.6 5.2 5.0  NEUTROABS  --   --  5.3  --  3.8  HGB 9.2* 8.0* 7.8* 7.7* 7.8*  HCT 28.5* 24.7* 24.5* 23.3* 23.7*  MCV  111.8* 110.8* 109.9* 108.9* 108.2*  PLT 152 131* 143* 129* 025   Basic Metabolic Panel: Recent Labs  Lab 05/11/19 0416 05/12/19 0620 05/13/19 1341 05/14/19 1022 05/15/19 0833  NA 141 141 141 138 140  K 3.6 3.4* 3.5 3.0* 3.5  CL 106 108 108 107 107  CO2 23 22 23 24 22   GLUCOSE 94 81 101* 108* 115*  BUN 13 16 17 17 14   CREATININE 1.44* 1.20* 0.86 0.80 0.83  CALCIUM 8.9 8.5* 8.6* 8.3* 8.5*  MG  --   --   --  1.5* 1.7   GFR: Estimated Creatinine Clearance: 64 mL/min (by C-G formula based on SCr of 0.83 mg/dL). Liver Function Tests: Recent Labs  Lab 05/13/19 1341 05/14/19 1022 05/15/19 0833  AST 12* 12* 13*  ALT 9 10 11   ALKPHOS 43 42 41  BILITOT 0.9 0.6 0.5  PROT 6.6 6.4* 6.3*  ALBUMIN 3.5 3.2* 3.2*   No results for input(s): LIPASE, AMYLASE in the last 168 hours. No results for input(s): AMMONIA in the last 168 hours. Coagulation Profile: No results for input(s): INR, PROTIME in the last 168 hours. Cardiac Enzymes: No results for input(s): CKTOTAL, CKMB, CKMBINDEX, TROPONINI in the last 168 hours. BNP (last 3 results) No results for input(s): PROBNP in the last 8760 hours. HbA1C: No results for input(s): HGBA1C in the last 72 hours. CBG: Recent Labs  Lab 05/09/19 1722  GLUCAP 87   Lipid Profile: No results for input(s): CHOL, HDL, LDLCALC, TRIG, CHOLHDL, LDLDIRECT in the last 72 hours. Thyroid Function Tests: No results for input(s): TSH, T4TOTAL, FREET4, T3FREE, THYROIDAB in the last 72 hours. Anemia Panel: No results for input(s): VITAMINB12, FOLATE, FERRITIN, TIBC, IRON, RETICCTPCT in the last 72 hours. Sepsis Labs: No results for input(s): PROCALCITON, LATICACIDVEN in the last 168 hours.  Recent Results (from the past 240 hour(s))  SARS Coronavirus 2 (CEPHEID - Performed in [place] hospital lab), Hosp Order     Status: None   Collection Time: 05/07/19  5:32 PM   Specimen: Nasopharyngeal Swab  Result Value Ref Range Status   SARS Coronavirus 2  NEGATIVE NEGATIVE Final    Comment: (NOTE) If result is NEGATIVE SARS-CoV-2 target nucleic acids are NOT DETECTED. The SARS-CoV-2 RNA is generally detectable in upper and lower  respiratory specimens during the acute phase of infection. The lowest  concentration of SARS-CoV-2 viral copies this assay can detect is 250  copies / mL. A negative result does not preclude SARS-CoV-2 infection  and should not be used as the sole basis for treatment or other  patient management decisions.  A negative result may occur with  improper specimen collection / handling, submission of specimen other  than nasopharyngeal swab, presence of viral mutation(s) within the  areas targeted by this assay, and inadequate number of viral copies  (<250 copies / mL). A negative result must be combined with clinical  observations, patient history, and epidemiological information. If result is POSITIVE SARS-CoV-2 target nucleic acids are DETECTED. The SARS-CoV-2 RNA is generally detectable in upper and lower  respiratory specimens dur ing the acute phase of infection.  Positive  results are indicative of active infection with SARS-CoV-2.  Clinical  correlation with patient history and other diagnostic information is  necessary to determine patient infection status.  Positive results do  not rule out bacterial infection or co-infection with other viruses. If result is PRESUMPTIVE POSTIVE SARS-CoV-2 nucleic acids MAY BE PRESENT.   A presumptive positive result was obtained on the submitted specimen  and confirmed on repeat testing.  While 2019 novel coronavirus  (SARS-CoV-2) nucleic acids may be present in the submitted sample  additional confirmatory testing may be necessary for epidemiological  and / or clinical management purposes  to differentiate between  SARS-CoV-2 and other Sarbecovirus currently known to infect humans.  If clinically indicated additional testing with an alternate test  methodology [phone])  is advised. The SARS-CoV-2 RNA is generally  detectable in upper and lower respiratory sp ecimens during the acute  phase of infection. The expected result is Negative. Fact Sheet for Patients:  StrictlyIdeas.no Fact Sheet for Healthcare Providers: BankingDealers.co.za This test is not yet approved or cleared by the [place] FDA and has been authorized for detection and/or diagnosis of SARS-CoV-2 by FDA under an Emergency Use Authorization (EUA).  This EUA will remain in effect (meaning this test can be used) for the duration of the COVID-19 declaration under Section 564(b)(1) of the Act, 21 U.S.C. section 360bbb-3(b)(1), unless the authorization is terminated or revoked sooner. Performed at [facility], [place] [address], [place], [place] [address]   Urine culture     Status: None   Collection Time: 05/07/19  7:59 PM   Specimen: Urine, Random  Result Value Ref Range Status   Specimen Description   Final    URINE, RANDOM Performed at [place] [address] [place], [place] [address]    Special Requests   Final    NONE Performed at [facility], [place] [address] [place], [place] [address]    Culture   Final    NO GROWTH Performed at [facility], [place] [address] [place], [place] [address]    Report Status 05/09/2019 FINAL  Final  MRSA PCR Screening     Status: None   Collection Time: 05/07/19 11:17 PM   Specimen: Nasal Mucosa; Nasopharyngeal  Result Value Ref Range Status   MRSA by PCR NEGATIVE NEGATIVE Final    Comment:        The GeneXpert MRSA Assay (FDA approved for NASAL specimens only), is one component of a comprehensive MRSA colonization surveillance program. It is not intended to diagnose MRSA infection nor to guide or monitor treatment for MRSA infections. Performed at [facility], [place] [address] [place], [place]  [address]       Radiology Studies: No results found.  Scheduled Meds:  amLODipine  5 mg Oral Daily   Chlorhexidine Gluconate Cloth  6 each Topical Daily   enoxaparin (LOVENOX) injection  40 mg Subcutaneous Q24H   estradiol  2 mg Oral Daily   FLUoxetine  40 mg Oral Daily   levothyroxine  50 mcg Oral Q0600   magnesium oxide  400 mg Oral BID   mouth rinse  15 mL Mouth Rinse BID   metoprolol succinate  25 mg Oral Daily   pantoprazole  40 mg Oral Daily   QUEtiapine  25 mg Oral QHS   sodium chloride flush  10-40 mL Intracatheter Q12H   Continuous Infusions:  sodium chloride 50 mL/hr at 05/14/19 1157  LOS: 8 days   Assessment & Plan:   Principal Problem:   Lung collapse Active Problems:   Small cell lung cancer, left (HCC)   Chronic abdominal pain   Chronic nausea   Chronic diarrhea   Delirium due to medical condition without behavioral disturbance   AKI (acute kidney injury) ([place])  Lung collapse secondary to 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  Chest CT done at the time of admission showed interval increase in the size of the left hilar mediastinal mass with increased mass-effect on the left mainstem bronchus.  Patient underwent bronchoscopy by PCCM on 05/10/2019 which showed diffuse studding of left mainstem bronchus with airway narrowing.  CT chest also showed slightly increased infectious versus inflammatory groundglass opacity but her procalcitonin is unremarkable so no antibiotics were given.  She is tested negative for COVID-19.  Radiation oncology on board And she has received total of 4 doses of radiation so far.  She needs to complete 10 doses.  Delirium: Multifactorial.  She has remained improved and stable since more than 24 hours.  She has been off of  restraints since 8 AM on 05/14/2019.  Acute kidney injury: Her baseline creatinine is around 1-1.1 and she was 1.2 yesterday.  Today's labs are pending.  Atypical chest pain: No more pain.  Troponins negative..  Recent ESBL UTI: Pleated course of ertapenem on 05/10/2019.  QTC prolongation on EKG: Closely and avoid QTC prolonging drugs.  COPD: Stable and not in exacerbation.  Continue current management.  Hypertension: Controlled.  Continue Toprol XL and Norvasc.  Hypothyroidism: Continue Synthroid.  Depression and anxiety: Tinea Prozac but discontinue Xanax for now.  Hypomagnesemia/hypokalemia: Resolved.  DVT prophylaxis: Lovenox Code Status: Full code Family Communication: Discussed with patient's friend [name] on 05/14/2019.  She requested placement at SNF. Disposition Plan: Pending placement at SNF.  The facility would not take her until Monday.   Time spent: 29 minutes   Darliss Cheney, MD [facility] Pager [phone]  If 7PM-7AM, please contact night-coverage [email or web address] Password [email or web address] 05/15/2019, 1:21 PM

## 2019-05-15 NOTE — TOC Progression Note (Signed)
Transition of Care [facility]) - Progression Note    Patient Details  Name: SHAMIYAH NGU MRN: [id number] Date of Birth: Feb 17, 1956  Transition of Care [facility]) CM/SW [place], [place] Phone Number: 05/15/2019, 11:34 AM  Clinical Narrative:    CSW assisting with placement at SNF. Patient accepted to 3 SNF's in the area. CSW provided patient POA [name] with list. Unfortunately   [facility] declined because they are unable to transport patient to [facility] for appointments. Patient has radiation schelduled at [facility]. [place] does not have a bed this weekend, possibly Monday. Physician and nurse notified.   Expected Discharge Plan: Skilled Nursing Facility Barriers to Discharge: No SNF bed offers at this time. .   Expected Discharge Plan and Services Expected Discharge Plan: [place]   Discharge Planning Services: CM Consult Post Acute Care Choice: Home Health, Durable Medical Equipment Living arrangements for the past 2 months: Single Family Home Expected Discharge Date: 05/14/19               DME Arranged: Gilford Rile rolling, 3-N-1 DME Agency: [employer] Date DME Agency Contacted: 05/13/19 Time DME Agency Contacted: 0900 Representative spoke with at DME Agency: nathan HH Arranged: RN, PT [place] Agency: [place] ([place]) Date [place]: 05/13/19 Time [place]: 1532 Representative spoke with at [place]: [place] ([place]) Interventions    Readmission Risk Interventions Readmission Risk Prevention Plan 05/05/2019  Transportation Screening Complete  PCP or Specialist Appt within 3-5 Days Complete  HRI or [place] Complete  Social Work Consult for [employer] [place] [employer] Planning/Counseling Complete  Palliative Care Screening Not Applicable  Medication Review Press photographer) Complete  Some recent data might be hidden

## 2019-05-16 LAB — CBC WITH DIFFERENTIAL/PLATELET
Abs Immature Granulocytes: 0.04 10*3/uL (ref 0.00–0.07)
Basophils Absolute: 0 10*3/uL (ref 0.0–0.1)
Basophils Relative: 0 %
Eosinophils Absolute: 0.1 10*3/uL (ref 0.0–0.5)
Eosinophils Relative: 1 %
HCT: 22.7 % — ABNORMAL LOW (ref 36.0–46.0)
Hemoglobin: 7.6 g/dL — ABNORMAL LOW (ref 12.0–15.0)
Immature Granulocytes: 1 %
Lymphocytes Relative: 16 %
Lymphs Abs: 0.6 10*3/uL — ABNORMAL LOW (ref 0.7–4.0)
MCH: 35.8 pg — ABNORMAL HIGH (ref 26.0–34.0)
MCHC: 33.5 g/dL (ref 30.0–36.0)
MCV: 107.1 fL — ABNORMAL HIGH (ref 80.0–100.0)
Monocytes Absolute: 0.5 10*3/uL (ref 0.1–1.0)
Monocytes Relative: 12 %
Neutro Abs: 2.5 10*3/uL (ref 1.7–7.7)
Neutrophils Relative %: 70 %
Platelets: 121 10*3/uL — ABNORMAL LOW (ref 150–400)
RBC: 2.12 MIL/uL — ABNORMAL LOW (ref 3.87–5.11)
RDW: 14.1 % (ref 11.5–15.5)
WBC: 3.6 10*3/uL — ABNORMAL LOW (ref 4.0–10.5)
nRBC: 0 % (ref 0.0–0.2)

## 2019-05-16 LAB — COMPREHENSIVE METABOLIC PANEL
ALT: 11 U/L (ref 0–44)
AST: 12 U/L — ABNORMAL LOW (ref 15–41)
Albumin: 3 g/dL — ABNORMAL LOW (ref 3.5–5.0)
Alkaline Phosphatase: 42 U/L (ref 38–126)
Anion gap: 10 (ref 5–15)
BUN: 13 mg/dL (ref 8–23)
CO2: 23 mmol/L (ref 22–32)
Calcium: 8.3 mg/dL — ABNORMAL LOW (ref 8.9–10.3)
Chloride: 107 mmol/L (ref 98–111)
Creatinine, Ser: 0.75 mg/dL (ref 0.44–1.00)
GFR calc Af Amer: >60 mL/min (ref >60)
GFR calc non Af Amer: >60 mL/min (ref >60)
Glucose, Bld: 103 mg/dL — ABNORMAL HIGH (ref 70–99)
Potassium: 3.4 mmol/L — ABNORMAL LOW (ref 3.5–5.1)
Sodium: 140 mmol/L (ref 135–145)
Total Bilirubin: 0.4 mg/dL (ref 0.3–1.2)
Total Protein: 6 g/dL — ABNORMAL LOW (ref 6.5–8.1)

## 2019-05-16 LAB — MAGNESIUM: Magnesium: 1.4 mg/dL — ABNORMAL LOW (ref 1.7–2.4)

## 2019-05-16 MED ORDER — QUETIAPINE FUMARATE 25 MG PO TABS
50.0000 mg | ORAL_TABLET | ORAL | Status: DC
  Administered 2019-05-16: 50 mg via ORAL
  Filled 2019-05-16: qty 2

## 2019-05-16 MED ORDER — POTASSIUM CHLORIDE CRYS ER 20 MEQ PO TBCR
40.0000 meq | EXTENDED_RELEASE_TABLET | Freq: Three times a day (TID) | ORAL | Status: AC
  Administered 2019-05-16 (×2): 40 meq via ORAL
  Filled 2019-05-16 (×2): qty 2

## 2019-05-16 MED ORDER — HALOPERIDOL LACTATE 5 MG/ML IJ SOLN
2.5000 mg | Freq: Once | INTRAMUSCULAR | Status: AC
  Administered 2019-05-16: 2.5 mg via INTRAVENOUS
  Filled 2019-05-16: qty 1

## 2019-05-16 MED ORDER — MAGNESIUM SULFATE 2 GM/50ML IV SOLN
2.0000 g | Freq: Once | INTRAVENOUS | Status: AC
  Administered 2019-05-16: 2 g via INTRAVENOUS
  Filled 2019-05-16: qty 50

## 2019-05-16 NOTE — Progress Notes (Signed)
PROGRESS NOTE    Rebekah Henderson  HMC:[id number] DOB: [birth year] DOA: 05/07/2019 PCP: Redmond School, MD   Brief Narrative:  [name] a 63 y.o.femalewith medical history significant ofsmall cell lung cancer on chemo, COPD, GERD, hypertension, hypothyroidism, IBS, and conditions listed below presenting to the hospital for evaluation of emesis. Patient was recently admitted on June 1 for ESBL UTI/pyelonephritis and treated with meropenem. She was converted to ertapenem on June 3 and discharged home for ongoing dosages until June 8.  Patient states she has been receiving chemotherapy for several years and has had intermittent nauseaand vomitingfor several years as well. States today she vomited after drinking ginger ale. She is also been having left lower quadrant abdominal pain for several years. Also reports having intermittent diarrhea for several years, most recent episode 2 days ago. States she has been seen at [facility] and another outside hospital and was told her symptoms were due to IBS, acid reflux, and fibromyalgia. She is also complaining of some pain in her lower sternal and upper abdominal region which she describes as a gurgling sensation. Also reports having acid reflux. States for the past few days she has been having sharp left-sided chest pain. Chest pain is nonexertional. Reports having chronic shortness of breath, no recent change. She uses 2 L home oxygen all the time.  Troponin x3 were checked and negative.  In the ED, work up revealed chest x-ray showed total opacification of the left hemithorax, concerning for large left pleural effusion and/or left mainstem bronchial obstruction by hilar mass. Chest CT showed slight interval increase in size of the left hilar and mediastinal mass about the left mainstem bronchus, with increased mass-effect on the left mainstem bronchus. Near-total atelectasis of the left lung secondary to left mainstem bronchial obstruction  with a small pleural effusion. Also showing slightly increased infectious or inflammatory groundglass opacity of the right upper lobe.  She underwent bronchoscopy on 6/8.  She was started on radiation therapy on Tuesday 6/10/ 2020.  Consultants:   Oncology  Radiation oncology  PCCM  Procedures:   Bronchoscopy on 05/10/2019  Antimicrobials:   None  Subjective: Patient seen and examined.  She was slightly confused today but was easily redirectable.  After redirection, she was able to answer all the questions appropriately regarding orientation.  She was alert.  She had mittens but no restraints.  Objective: Vitals:   05/15/19 0734 05/15/19 1304 05/15/19 2142 05/16/19 0601  BP:  128/78 121/79 (!) 145/81  Pulse: (!) 101 (!) 101 80 95  Resp:  16 19 18   Temp:  97.9 F (36.6 C) 97.6 F (36.4 C) 97.7 F (36.5 C)  TempSrc:  Oral Oral Oral  SpO2:  97% 97%   Weight:      Height:        Intake/Output Summary (Last 24 hours) at 05/16/2019 1039 Last data filed at 05/15/2019 1900 Gross per 24 hour  Intake 700 ml  Output --  Net 700 ml   Filed Weights   05/07/19 1643  Weight: 68 kg    Examination:  General exam: Appears calm and comfortable but slightly confused Respiratory system: Clear to auscultation. Respiratory effort normal. Cardiovascular system: S1 & S2 heard, RRR. No JVD, murmurs, rubs, gallops or clicks. No pedal edema. Gastrointestinal system: Abdomen is nondistended, soft and nontender. No organomegaly or masses felt. Normal bowel sounds heard. Central nervous system: Alert and oriented. No focal neurological deficits. Extremities: Symmetric 5 x 5 power. Skin: No rashes, lesions or  ulcers Psychiatry: Judgement and insight appear normal. Mood & affect appropriate.    Data Reviewed: I have personally reviewed following labs and imaging studies  CBC: Recent Labs  Lab 05/12/19 0620 05/13/19 1341 05/14/19 1022 05/15/19 0833 05/16/19 0955  WBC 6.2 6.6 5.2 5.0  3.6*  NEUTROABS  --  5.3  --  3.8 2.5  HGB 8.0* 7.8* 7.7* 7.8* 7.6*  HCT 24.7* 24.5* 23.3* 23.7* 22.7*  MCV 110.8* 109.9* 108.9* 108.2* 107.1*  PLT 131* 143* 129* 151 147*   Basic Metabolic Panel: Recent Labs  Lab 05/12/19 0620 05/13/19 1341 05/14/19 1022 05/15/19 0833 05/16/19 0955  NA 141 141 138 140 140  K 3.4* 3.5 3.0* 3.5 3.4*  CL 108 108 107 107 107  CO2 22 23 24 22 23   GLUCOSE 81 101* 108* 115* 103*  BUN 16 17 17 14 13   CREATININE 1.20* 0.86 0.80 0.83 0.75  CALCIUM 8.5* 8.6* 8.3* 8.5* 8.3*  MG  --   --  1.5* 1.7 1.4*   GFR: Estimated Creatinine Clearance: 66.4 mL/min (by C-G formula based on SCr of 0.75 mg/dL). Liver Function Tests: Recent Labs  Lab 05/13/19 1341 05/14/19 1022 05/15/19 0833 05/16/19 0955  AST 12* 12* 13* 12*  ALT 9 10 11 11   ALKPHOS 43 42 41 42  BILITOT 0.9 0.6 0.5 0.4  PROT 6.6 6.4* 6.3* 6.0*  ALBUMIN 3.5 3.2* 3.2* 3.0*   No results for input(s): LIPASE, AMYLASE in the last 168 hours. No results for input(s): AMMONIA in the last 168 hours. Coagulation Profile: No results for input(s): INR, PROTIME in the last 168 hours. Cardiac Enzymes: No results for input(s): CKTOTAL, CKMB, CKMBINDEX, TROPONINI in the last 168 hours. BNP (last 3 results) No results for input(s): PROBNP in the last 8760 hours. HbA1C: No results for input(s): HGBA1C in the last 72 hours. CBG: Recent Labs  Lab 05/09/19 1722  GLUCAP 87   Lipid Profile: No results for input(s): CHOL, HDL, LDLCALC, TRIG, CHOLHDL, LDLDIRECT in the last 72 hours. Thyroid Function Tests: No results for input(s): TSH, T4TOTAL, FREET4, T3FREE, THYROIDAB in the last 72 hours. Anemia Panel: No results for input(s): VITAMINB12, FOLATE, FERRITIN, TIBC, IRON, RETICCTPCT in the last 72 hours. Sepsis Labs: No results for input(s): PROCALCITON, LATICACIDVEN in the last 168 hours.  Recent Results (from the past 240 hour(s))  SARS Coronavirus 2 (CEPHEID - Performed in [place] hospital lab),  Hosp Order     Status: None   Collection Time: 05/07/19  5:32 PM   Specimen: Nasopharyngeal Swab  Result Value Ref Range Status   SARS Coronavirus 2 NEGATIVE NEGATIVE Final    Comment: (NOTE) If result is NEGATIVE SARS-CoV-2 target nucleic acids are NOT DETECTED. The SARS-CoV-2 RNA is generally detectable in upper and lower  respiratory specimens during the acute phase of infection. The lowest  concentration of SARS-CoV-2 viral copies this assay can detect is 250  copies / mL. A negative result does not preclude SARS-CoV-2 infection  and should not be used as the sole basis for treatment or other  patient management decisions.  A negative result may occur with  improper specimen collection / handling, submission of specimen other  than nasopharyngeal swab, presence of viral mutation(s) within the  areas targeted by this assay, and inadequate number of viral copies  (<250 copies / mL). A negative result must be combined with clinical  observations, patient history, and epidemiological information. If result is POSITIVE SARS-CoV-2 target nucleic acids are DETECTED. The SARS-CoV-2  RNA is generally detectable in upper and lower  respiratory specimens dur ing the acute phase of infection.  Positive  results are indicative of active infection with SARS-CoV-2.  Clinical  correlation with patient history and other diagnostic information is  necessary to determine patient infection status.  Positive results do  not rule out bacterial infection or co-infection with other viruses. If result is PRESUMPTIVE POSTIVE SARS-CoV-2 nucleic acids MAY BE PRESENT.   A presumptive positive result was obtained on the submitted specimen  and confirmed on repeat testing.  While 2019 novel coronavirus  (SARS-CoV-2) nucleic acids may be present in the submitted sample  additional confirmatory testing may be necessary for epidemiological  and / or clinical management purposes  to differentiate between    SARS-CoV-2 and other Sarbecovirus currently known to infect humans.  If clinically indicated additional testing with an alternate test  methodology [phone]) is advised. The SARS-CoV-2 RNA is generally  detectable in upper and lower respiratory sp ecimens during the acute  phase of infection. The expected result is Negative. Fact Sheet for Patients:  StrictlyIdeas.no Fact Sheet for Healthcare Providers: BankingDealers.co.za This test is not yet approved or cleared by the [place] FDA and has been authorized for detection and/or diagnosis of SARS-CoV-2 by FDA under an Emergency Use Authorization (EUA).  This EUA will remain in effect (meaning this test can be used) for the duration of the COVID-19 declaration under Section 564(b)(1) of the Act, 21 U.S.C. section 360bbb-3(b)(1), unless the authorization is terminated or revoked sooner. Performed at [facility], [place] [address] [place], [place] [address]   Urine culture     Status: None   Collection Time: 05/07/19  7:59 PM   Specimen: Urine, Random  Result Value Ref Range Status   Specimen Description   Final    URINE, RANDOM Performed at [place] [address] [place], [place] [address]    Special Requests   Final    NONE Performed at [facility], [place] [address], [place], [place] [address]    Culture   Final    NO GROWTH Performed at [facility], [place] [address], [place], [place] [address]    Report Status 05/09/2019 FINAL  Final  MRSA PCR Screening     Status: None   Collection Time: 05/07/19 11:17 PM   Specimen: Nasal Mucosa; Nasopharyngeal  Result Value Ref Range Status   MRSA by PCR NEGATIVE NEGATIVE Final    Comment:        The GeneXpert MRSA Assay (FDA approved for NASAL specimens only), is one component of a comprehensive MRSA colonization surveillance program. It is not intended to diagnose  MRSA infection nor to guide or monitor treatment for MRSA infections. Performed at [facility], [place] [address], [place], [place] [address]       Radiology Studies: No results found.  Scheduled Meds:  amLODipine  5 mg Oral Daily   Chlorhexidine Gluconate Cloth  6 each Topical Daily   enoxaparin (LOVENOX) injection  40 mg Subcutaneous Q24H   estradiol  2 mg Oral Daily   FLUoxetine  40 mg Oral Daily   levothyroxine  50 mcg Oral Q0600   magnesium oxide  400 mg Oral BID   mouth rinse  15 mL Mouth Rinse BID   metoprolol succinate  25 mg Oral Daily   pantoprazole  40 mg Oral Daily   QUEtiapine  25 mg Oral QHS   sodium chloride  flush  10-40 mL Intracatheter Q12H   Continuous Infusions:  sodium chloride 50 mL/hr at 05/14/19 1157     LOS: 9 days   Assessment & Plan:   Principal Problem:   Lung collapse Active Problems:   Small cell lung cancer, left (HCC)   Chronic abdominal pain   Chronic nausea   Chronic diarrhea   Delirium due to medical condition without behavioral disturbance   AKI (acute kidney injury) ([place])  Lung collapse secondary to 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  Chest CT done at the time of admission showed interval increase in the size of the left hilar mediastinal mass with increased mass-effect on the left mainstem bronchus.  Patient underwent bronchoscopy by PCCM on 05/10/2019 which showed diffuse studding of left mainstem bronchus with airway narrowing.  CT chest also showed slightly increased infectious versus inflammatory groundglass opacity but her procalcitonin is unremarkable so no antibiotics were given.  She is tested negative for COVID-19.  Radiation oncology on board And she has received total of 4 doses of radiation so far.   She needs to complete 10 doses.  Delirium: Multifactorial.  She has remained improved and stable since last 2 days.  Has a little bit of confusion today.  She has been off of restraints since 8 AM on 05/14/2019.  Acute kidney injury: Resolved.  Atypical chest pain: No more pain.  Troponins negative..  Recent ESBL UTI: Pleated course of ertapenem on 05/10/2019.  QTC prolongation on EKG: Closely and avoid QTC prolonging drugs.  COPD: Stable and not in exacerbation.  Continue current management.  Hypertension: Controlled.  Continue Toprol XL and Norvasc.  Hypothyroidism: Continue Synthroid.  Depression and anxiety: Tinea Prozac but discontinue Xanax for now.  Hypomagnesemia/hypokalemia: Potassium 3.4.  Will replace today.  Also magnesium 1.4.  Will replace that as well.  DVT prophylaxis: Lovenox Code Status: Full code Family Communication: Discussed with patient's friend [name] on 05/15/2019.  She requested placement at SNF. Disposition Plan: Pending placement at SNF.  The facility would not take her until Monday.   Time spent: 24 minutes   Darliss Cheney, MD [facility] Pager [phone]  If 7PM-7AM, please contact night-coverage [email or web address] Password [facility] 05/16/2019, 10:39 AM

## 2019-05-16 NOTE — Progress Notes (Signed)
  Radiation Oncology         ([phone]) [phone] ________________________________  Name: Rebekah Henderson MRN: [id number]  Date: 05/11/2019  DOB: Apr 13, 1956  SIMULATION AND TREATMENT PLANNING NOTE    ICD-10-CM   1. 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  NARRATIVE:  The patient was brought to the [facility].  Identity was confirmed.  All relevant records and images related to the planned course of therapy were reviewed.  The patient freely provided informed written consent to proceed with treatment after reviewing the details related to the planned course of therapy. The consent form was witnessed and verified by the simulation staff.  Then, the patient was set-up in a stable reproducible  supine position for radiation therapy.  CT images were obtained.  Surface markings were placed.  The CT images were loaded into the planning software.  Then the target and avoidance structures were contoured.  Treatment planning then occurred.  The radiation prescription was entered and confirmed.  Then, I designed and supervised the construction of a total of 6 medically necessary complex treatment devices, including a BodyFix immobilization mold custom fitted to the patient along with 5 multileaf collimators conformally shaped radiation around the treatment target while shielding critical structures such as the heart and spinal cord maximally.  I have requested : 3D Simulation  I have requested a DVH of the following structures: Left lung, right lung, spinal cord, heart, esophagus, and target.  I have ordered:Nutrition Consult  SPECIAL TREATMENT PROCEDURE:  The planned course of therapy using radiation constitutes a special treatment procedure. Special care is required in the management of this patient for the following reasons. This treatment constitutes a Special  Treatment Procedure for the following reason: [ Retreatment in a previously radiated area requiring careful monitoring of increased risk of toxicity due to overlap of previous treatment..  The special nature of the planned course of radiotherapy will require increased physician supervision and oversight to ensure patient's safety with optimal treatment outcomes.  PLAN:  The patient will receive 30 Gy in 10 fractions.  ________________________________  Sheral Apley Tammi Klippel, M.D.

## 2019-05-16 NOTE — Care Management Important Message (Signed)
Important Message  Patient Details  Name: Rebekah Henderson MRN: [id number] Date of Birth: 08-Dec-1955   Medicare Important Message Given:  Yes    Joaquin Courts, RN 05/16/2019, 12:06 PM

## 2019-05-17 ENCOUNTER — Inpatient Hospital Stay: Payer: Medicare Other

## 2019-05-17 ENCOUNTER — Inpatient Hospital Stay: Payer: Medicare Other | Admitting: Physician Assistant

## 2019-05-17 ENCOUNTER — Telehealth: Payer: Self-pay | Admitting: Internal Medicine

## 2019-05-17 ENCOUNTER — Ambulatory Visit
Admit: 2019-05-17 | Discharge: 2019-05-17 | Disposition: A | Discharge status: Home / Self Care | Payer: Medicare Other | Attending: Radiation Oncology | Admitting: Radiation Oncology

## 2019-05-17 ENCOUNTER — Telehealth: Payer: Self-pay | Admitting: Medical Oncology

## 2019-05-17 DIAGNOSIS — N136 Pyonephrosis: Secondary | ICD-10-CM

## 2019-05-17 LAB — BASIC METABOLIC PANEL
Anion gap: 8 (ref 5–15)
BUN: 10 mg/dL (ref 8–23)
CO2: 24 mmol/L (ref 22–32)
Calcium: 9 mg/dL (ref 8.9–10.3)
Chloride: 108 mmol/L (ref 98–111)
Creatinine, Ser: 0.8 mg/dL (ref 0.44–1.00)
GFR calc Af Amer: >60 mL/min (ref >60)
GFR calc non Af Amer: >60 mL/min (ref >60)
Glucose, Bld: 119 mg/dL — ABNORMAL HIGH (ref 70–99)
Potassium: 3.9 mmol/L (ref 3.5–5.1)
Sodium: 140 mmol/L (ref 135–145)

## 2019-05-17 LAB — MAGNESIUM: Magnesium: 1.9 mg/dL (ref 1.7–2.4)

## 2019-05-17 MED ORDER — HYDROCODONE-ACETAMINOPHEN 5-325 MG PO TABS: 1.0000 | ORAL_TABLET | ORAL | 0 refills | Status: AC | PRN

## 2019-05-17 MED ORDER — HEPARIN SOD (PORK) LOCK FLUSH 100 UNIT/ML IV SOLN
500.0000 [IU] | INTRAVENOUS | Status: AC | PRN
  Administered 2019-05-17: 500 [IU]

## 2019-05-17 MED ORDER — ALPRAZOLAM 0.25 MG PO TABS: 0.5000 mg | ORAL_TABLET | Freq: Every evening | ORAL | 0 refills | Status: AC | PRN

## 2019-05-17 NOTE — Progress Notes (Signed)
HEMATOLOGY-ONCOLOGY PROGRESS NOTE  SUBJECTIVE: Confusion improved this morning.  Continues radiation to her left lung mass.  She will be discharging to a skilled nursing facility for rehabilitation.  The patient is a bit agitated at time my visit as she just realized that she will not be going home and will be going to a facility instead.  She has no complaints of chest pain, shortness of breath, cough.  Denies pain.  She has no other complaints today.  Oncology History Overview Note  Patient presented with hoarseness, cough, and wheezing.  Work up showed mediastinal mass.   Small cell lung cancer, left ([place])   Staging form: Lung, AJCC 7th Edition   - Clinical stage from 09/19/2016: Stage IIIA (T1b, N2, M0) - Signed by Curt Bears, MD on 09/19/2016    Small cell lung cancer, left ([place])  09/12/2016 Surgery     09/12/2016 Surgery   Flexible video fiberoptic bronchoscopy with endobronchial ultrasound and biopsies.   09/13/2016 Pathology Results   Bronchus, biopsy, Left Main Stem - SMALL CELL CARCINOMA.    09/19/2016 Initial Diagnosis   Small cell lung cancer, left ([place])   09/27/2016 Imaging   PET IMPRESSION: 3.3 cm mass in the medial left lower lobe adjacent to the descending thoracic aorta, corresponding to suspected primary bronchogenic neoplasm. Associated mediastinal and left hilar nodal metastases.   09/27/2016 Imaging   MRI BrIMPRESSION: No acute intracranial abnormality. No intracranial metastatic disease.ain    10/02/2016 -  Chemotherapy   The patient had palonosetron (ALOXI) injection 0.25 mg, 0.25 mg, Intravenous,  Once, 1 of 6 cycles  pegfilgrastim (NEULASTA) injection 6 mg, 6 mg, Subcutaneous,  Once, 0 of 5 cycles  CISplatin (PLATINOL) 119 mg in sodium chloride 0.9 % 500 mL chemo infusion, 60 mg/m2 = 119 mg, Intravenous,  Once, 1 of 6 cycles  etoposide (VEPESID) 240 mg in sodium chloride 0.9 % 600 mL chemo infusion, 120 mg/m2 = 240 mg, Intravenous,  Once, 1  of 6 cycles  fosaprepitant (EMEND) 150 mg, dexamethasone (DECADRON) 12 mg in sodium chloride 0.9 % 145 mL IVPB, , Intravenous,  Once, 1 of 6 cycles  palonosetron (ALOXI) injection 0.25 mg, 0.25 mg, Intravenous,  Once, 2 of 4 cycles  pegfilgrastim (NEULASTA) injection 6 mg, 6 mg, Subcutaneous,  Once, 2 of 4 cycles  CARBOplatin (PARAPLATIN) 480 mg in sodium chloride 0.9 % 250 mL chemo infusion, 480 mg (100 % of original dose 481.6 mg), Intravenous,  Once, 2 of 4 cycles Dose modification: 602 mg (original dose 481.6 mg, Cycle 1), 481.6 mg (original dose 481.6 mg, Cycle 1, Reason: Provider Judgment), 602 mg (original dose 602 mg, Cycle 2), 481.6 mg (original dose 602 mg, Cycle 2, Reason: Provider Judgment)  etoposide (VEPESID) 190 mg in sodium chloride 0.9 % 500 mL chemo infusion, 100 mg/m2 = 190 mg, Intravenous,  Once, 2 of 4 cycles  for chemotherapy treatment.     11/13/2018 -  Chemotherapy   The patient had palonosetron (ALOXI) injection 0.25 mg, 0.25 mg, Intravenous,  Once, 7 of 12 cycles Administration: 0.25 mg (11/13/2018), 0.25 mg (03/03/2019), 0.25 mg (12/03/2018), 0.25 mg (03/31/2019), 0.25 mg (04/07/2019), 0.25 mg (12/10/2018), 0.25 mg (12/22/2018), 0.25 mg (12/29/2018), 0.25 mg (02/03/2019), 0.25 mg (02/10/2019) irinotecan (CAMPTOSAR) 120 mg in dextrose 5 % 500 mL chemo infusion, 65 mg/m2 = 120 mg, Intravenous,  Once, 7 of 12 cycles Dose modification: 50 mg/m2 (original dose 65 mg/m2, Cycle 5, Reason: Dose not tolerated) Administration: 120 mg (11/13/2018), 80 mg (03/03/2019), 80  mg (12/03/2018), 80 mg (03/31/2019), 80 mg (04/07/2019), 80 mg (12/10/2018), 80 mg (12/22/2018), 80 mg (12/29/2018), 80 mg (02/03/2019), 80 mg (02/10/2019) CISplatin (PLATINOL) 54 mg in sodium chloride 0.9 % 250 mL chemo infusion, 30 mg/m2 = 54 mg, Intravenous,  Once, 7 of 12 cycles Dose modification: 25 mg/m2 (original dose 30 mg/m2, Cycle 5, Reason: Dose not tolerated) Administration: 54 mg (11/13/2018), 45 mg (03/03/2019), 45 mg  (12/03/2018), 45 mg (03/31/2019), 45 mg (04/07/2019), 45 mg (12/10/2018), 45 mg (12/22/2018), 45 mg (12/29/2018), 45 mg (02/03/2019), 45 mg (02/10/2019) fosaprepitant (EMEND) 150 mg, dexamethasone (DECADRON) 12 mg in sodium chloride 0.9 % 145 mL IVPB, , Intravenous,  Once, 7 of 12 cycles Administration:  (11/13/2018),  (03/03/2019),  (12/03/2018),  (03/31/2019),  (04/07/2019),  (12/10/2018),  (12/22/2018),  (12/29/2018),  (02/03/2019),  (02/10/2019)  for chemotherapy treatment.       REVIEW OF SYSTEMS:   Constitutional: No fevers or chills Eyes: Denies blurriness of vision Ears, nose, mouth, throat, and face: Denies mucositis or sore throat Respiratory: Denies cough, dyspnea or wheezes Cardiovascular: Denies palpitation, chest discomfort Gastrointestinal:  Denies nausea, heartburn.  Reports diarrhea. Skin: Denies abnormal skin rashes Lymphatics: Denies new lymphadenopathy or easy bruising Neurological: Denies headache and dizziness Behavioral/Psych: Confusion improved Extremities: No lower extremity edema All other systems were reviewed with the patient and are negative.  I have reviewed the past medical history, past surgical history, social history and family history with the patient and they are unchanged from previous note.   PHYSICAL EXAMINATION: ECOG PERFORMANCE STATUS: 2 - Symptomatic, <50% confined to bed  Vitals:   05/16/19 2104 05/17/19 0622  BP: 121/84 138/75  Pulse: 81 75  Resp: 19 17  Temp: (!) 97.5 F (36.4 C) 97.7 F (36.5 C)  SpO2: 100% 99%   Filed Weights   05/07/19 1643  Weight: 149 lb 14.6 oz (68 kg)    Intake/Output from previous day: 06/14 0701 - 06/15 0700 In: 1551.8 [I.V.:1551.8] Out: -   GENERAL:alert, mildly confused, no distress SKIN: skin color, texture, turgor are normal, no rashes or significant lesions EYES: normal, Conjunctiva are pink and non-injected, sclera clear OROPHARYNX:no exudate, no erythema and lips, buccal mucosa, and tongue normal  NECK: supple,  thyroid normal size, non-tender, without nodularity LYMPH:  no palpable lymphadenopathy in the cervical, axillary or inguinal LUNGS: Decreased breath sounds on the left.  Right is clear. HEART: regular rate & rhythm and no murmurs and no lower extremity edema ABDOMEN:abdomen soft, non-tender and normal bowel sounds Musculoskeletal:no cyanosis of digits and no clubbing  NEURO: alert, follows commands.    LABORATORY DATA:  I have reviewed the data as listed CMP Latest Ref Rng & Units 05/17/2019 05/16/2019 05/15/2019  Glucose 70 - 99 mg/dL 119(H) 103(H) 115(H)  BUN 8 - 23 mg/dL 10 13 14   Creatinine 0.44 - 1.00 mg/dL 0.80 0.75 0.83  Sodium 135 - 145 mmol/L 140 140 140  Potassium 3.5 - 5.1 mmol/L 3.9 3.4(L) 3.5  Chloride 98 - 111 mmol/L 108 107 107  CO2 22 - 32 mmol/L 24 23 22   Calcium 8.9 - 10.3 mg/dL 9.0 8.3(L) 8.5(L)  Total Protein 6.5 - 8.1 g/dL - 6.0(L) 6.3(L)  Total Bilirubin 0.3 - 1.2 mg/dL - 0.4 0.5  Alkaline Phos 38 - 126 U/L - 42 41  AST 15 - 41 U/L - 12(L) 13(L)  ALT 0 - 44 U/L - 11 11    Lab Results  Component Value Date   WBC 3.6 (L) 05/16/2019  HGB 7.6 (L) 05/16/2019   HCT 22.7 (L) 05/16/2019   MCV 107.1 (H) 05/16/2019   PLT 121 (L) 05/16/2019   NEUTROABS 2.5 05/16/2019    Ct Head W & Wo Contrast  Result Date: 05/10/2019 CLINICAL DATA:  Small cell lung cancer. Confusion and hallucinations recently. EXAM: CT HEAD WITHOUT AND WITH CONTRAST TECHNIQUE: Contiguous axial images were obtained from the base of the skull through the vertex without and with intravenous contrast CONTRAST:  94mL OMNIPAQUE IOHEXOL 300 MG/ML  SOLN COMPARISON:  MRI 02/09/2018 FINDINGS: Brain: The brainstem and cerebellum are unremarkable. There are mild chronic small-vessel ischemic changes of the cerebral hemispheric white matter. Old lacunar infarction in the right basal ganglia/internal capsule. No sign of recent infarction, mass lesion, hemorrhage, hydrocephalus or extra-axial collection. No abnormal  enhancement occurs. Vascular: There is atherosclerotic calcification of the major vessels at the base of the brain. Skull: Negative Sinuses/Orbits: Clear/normal Other: None IMPRESSION: No evidence of metastatic disease. No evidence of acute or subacute infarction by CT. Background pattern of chronic small vessel ischemic change. Old lacunar infarction right basal ganglia/internal capsule. Electronically Signed   By: Nelson Chimes M.D.   On: 05/10/2019 14:00   Ct Chest W Contrast  Result Date: 05/07/2019 CLINICAL DATA:  Lung cancer, UTI, chest pain, abdominal pain, vomiting EXAM: CT CHEST, ABDOMEN, AND PELVIS WITH CONTRAST TECHNIQUE: Multidetector CT imaging of the chest, abdomen and pelvis was performed following the standard protocol during bolus administration of intravenous contrast. CONTRAST:  136mL OMNIPAQUE IOHEXOL 300 MG/ML  SOLN COMPARISON:  Same day chest radiograph, CT chest abdomen pelvis, 04/09/2019 FINDINGS: CT CHEST FINDINGS Cardiovascular: No significant vascular findings. Normal heart size. No pericardial effusion. Right chest port catheter. Mediastinum/Nodes: Leftward shift of the mediastinum. Slight interval increase in size of a left hilar and mediastinal mass about the left mainstem bronchus, with increased mass effect on the left mainstem bronchus. Enlarged subcarinal lymph node and involvement of the midportion of the esophagus by mass. Normal thyroid. Small hiatal hernia. Lungs/Pleura: Near total atelectasis of the left lung secondary to left mainstem bronchial obstruction with a small pleural effusion. Slightly increased ground-glass opacity of the right upper lobe (series 4, image 50). Mild emphysema. Musculoskeletal: No chest wall mass. CT ABDOMEN PELVIS FINDINGS Hepatobiliary: No focal liver abnormality is seen. Status post cholecystectomy with postoperative biliary ductal dilatation. Pancreas: Unremarkable. No pancreatic ductal dilatation or surrounding inflammatory changes. Spleen:  Normal in size without significant abnormality. Adrenals/Urinary Tract: Adrenal glands are unremarkable. Kidneys are normal, without renal calculi, solid lesion, or hydronephrosis. Bladder is unremarkable. Stomach/Bowel: Stomach is within normal limits. Appendix appears normal. No evidence of bowel wall thickening, distention, or inflammatory changes. Sigmoid diverticulosis. Vascular/Lymphatic: Calcific atherosclerosis. No enlarged abdominal or pelvic lymph nodes. Reproductive: Status post hysterectomy. Other: No abdominal wall hernia or abnormality. No abdominopelvic ascites. Musculoskeletal: Unchanged sclerotic lesion of the L3 vertebral body. IMPRESSION: 1. Slight interval increase in size of a left hilar and mediastinal mass about the left mainstem bronchus, with increased mass effect on the left mainstem bronchus. Near total atelectasis of the left lung secondary to left mainstem bronchial obstruction with a small pleural effusion. 2. Slightly increased infectious or inflammatory ground-glass opacity of the right upper lobe (series 4, image 50). Mild emphysema. 3.  Unchanged sclerotic lesion of the L3 vertebral body. 4. Other chronic, incidental, and postoperative findings as detailed above. Electronically Signed   By: Eddie Candle M.D.   On: 05/07/2019 19:53   Ct Abdomen Pelvis W Contrast  Result Date: 05/07/2019 CLINICAL DATA:  Lung cancer, UTI, chest pain, abdominal pain, vomiting EXAM: CT CHEST, ABDOMEN, AND PELVIS WITH CONTRAST TECHNIQUE: Multidetector CT imaging of the chest, abdomen and pelvis was performed following the standard protocol during bolus administration of intravenous contrast. CONTRAST:  142mL OMNIPAQUE IOHEXOL 300 MG/ML  SOLN COMPARISON:  Same day chest radiograph, CT chest abdomen pelvis, 04/09/2019 FINDINGS: CT CHEST FINDINGS Cardiovascular: No significant vascular findings. Normal heart size. No pericardial effusion. Right chest port catheter. Mediastinum/Nodes: Leftward shift of the  mediastinum. Slight interval increase in size of a left hilar and mediastinal mass about the left mainstem bronchus, with increased mass effect on the left mainstem bronchus. Enlarged subcarinal lymph node and involvement of the midportion of the esophagus by mass. Normal thyroid. Small hiatal hernia. Lungs/Pleura: Near total atelectasis of the left lung secondary to left mainstem bronchial obstruction with a small pleural effusion. Slightly increased ground-glass opacity of the right upper lobe (series 4, image 50). Mild emphysema. Musculoskeletal: No chest wall mass. CT ABDOMEN PELVIS FINDINGS Hepatobiliary: No focal liver abnormality is seen. Status post cholecystectomy with postoperative biliary ductal dilatation. Pancreas: Unremarkable. No pancreatic ductal dilatation or surrounding inflammatory changes. Spleen: Normal in size without significant abnormality. Adrenals/Urinary Tract: Adrenal glands are unremarkable. Kidneys are normal, without renal calculi, solid lesion, or hydronephrosis. Bladder is unremarkable. Stomach/Bowel: Stomach is within normal limits. Appendix appears normal. No evidence of bowel wall thickening, distention, or inflammatory changes. Sigmoid diverticulosis. Vascular/Lymphatic: Calcific atherosclerosis. No enlarged abdominal or pelvic lymph nodes. Reproductive: Status post hysterectomy. Other: No abdominal wall hernia or abnormality. No abdominopelvic ascites. Musculoskeletal: Unchanged sclerotic lesion of the L3 vertebral body. IMPRESSION: 1. Slight interval increase in size of a left hilar and mediastinal mass about the left mainstem bronchus, with increased mass effect on the left mainstem bronchus. Near total atelectasis of the left lung secondary to left mainstem bronchial obstruction with a small pleural effusion. 2. Slightly increased infectious or inflammatory ground-glass opacity of the right upper lobe (series 4, image 50). Mild emphysema. 3.  Unchanged sclerotic lesion of the  L3 vertebral body. 4. Other chronic, incidental, and postoperative findings as detailed above. Electronically Signed   By: Eddie Candle M.D.   On: 05/07/2019 19:53   Dg Chest Port 1 View  Result Date: 05/07/2019 CLINICAL DATA:  UTI, lung cancer, emesis EXAM: PORTABLE CHEST 1 VIEW COMPARISON:  04/03/2019 FINDINGS: There is total opacification of the left hemithorax. The cardiac and mediastinal borders are obscured. Right chest port catheter. IMPRESSION: There is total opacification of the left hemithorax, concerning for large left pleural effusion and/or left mainstem bronchial obstruction by hilar mass. Recommend CT to further evaluate. Electronically Signed   By: Eddie Candle M.D.   On: 05/07/2019 17:31    ASSESSMENT AND PLAN: 1.  Metastatic small cell lung cancer 2.  Complete collapse of the left lung secondary to extrinsic compression of the left mainstem bronchus 3.  Altered mental status 4. Urinary tract infection/pyelonephritis 5. 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 Tammi Klippel.  Due to complete treatment on 05/24/2019. -I will work on rescheduling her outpatient appointments with Dr. Julien Nordmann to discuss further treatment from a medical oncology standpoint.   LOS: 10 days   Mikey Bussing, DNP, AGPCNP-BC, AOCNP 05/17/19

## 2019-05-17 NOTE — Discharge Summary (Signed)
Physician Discharge Summary  LIDA BERKERY EPP:[id number] DOB: 04/19/1956 DOA: 05/07/2019  PCP: Redmond School, MD  Admit date: 05/07/2019 Discharge date: 05/17/2019  Admitted From: Home Disposition: Home  Recommendations for Outpatient Follow-up:  1. Follow up with PCP in 1-2 weeks 2. Please obtain BMP/CBC in one week 3. Please follow up on the following pending results:  Home Health: Yes Equipment/Devices: None  Discharge Condition: Stable CODE STATUS: Full code Diet recommendation: Cardiac  Subjective: Patient seen and examined.  She has no complaints.  She was not having and mittens today.  She was completely alert and oriented and back at her 2 L of oxygen without any dyspnea.  Brief/Interim Summary: Preslea Rhodus Kornis a 63 y.o.femalewith medical history significant ofsmall cell lung cancer on chemo, COPD, GERD, hypertension, hypothyroidism, IBS, presented to the hospital for evaluation of emesis. Patient was recently admitted on June 1 for ESBL UTI/pyelonephritis and treated with meropenem. She was converted to ertapenem on June 3 and discharged home for ongoing dosages until June 8.  Patient has been receiving chemotherapy for several years and has had intermittent nauseaand vomitingfor several years as well. She is also been having left lower quadrant abdominal pain for several years. Also having intermittent diarrhea for several years. Stated for the past few days she has been having sharp left-sided chest pain. Chest pain was nonexertional. Reported chronic shortness of breath, no recent change. She uses 2 L home oxygen all the time.  Troponin x3 were checked and negative.  In the ED, work up revealed chest x-ray showed total opacification of the left hemithorax, concerning for large left pleural effusion and/or left mainstem bronchial obstruction by hilar mass. Chest CT showed slight interval increase in size of the left hilar and mediastinal mass about the left mainstem  bronchus, with increased mass-effect on the left mainstem bronchus. Near-total atelectasis of the left lung secondary to left mainstem bronchial obstruction with a small pleural effusion. Also showing slightly increased infectious or inflammatory groundglass opacity of the right upper lobe.She underwent bronchoscopy on 6/8 which showed diffuse studding of left mainstem bronchus with airway narrowing. CT chest also showed slightly increased infectious versus inflammatory groundglass opacity but her procalcitonin is unremarkable so no antibiotics were given.  She was tested negative for COVID-19. Radiation oncology was consulted and she was started on radiation and has received total of 5 doses out of 10.  She also came in with acute kidney injury which has resolved since then.  The hospitalization was also complicated with hospital associated delirium for past few days but the patient is doing much better today and she is completely alert and oriented and out of her restraints for past 4 days.  She was seen by PT OT and they recommended discharging to either SNF or home with 24-hour supervision.  Patient lives alone in her home but has a very close friend named [name].  I have been talking to [place] every day for updates and initially [name] reaffirmed that she is willing to provide her 24-hour supervision and would not want her to go to skilled nursing facility instead.  Based on that, patient was initially discharged last week however due to significant weakness, [name] changed her mind and then requested to place her on a skilled nursing facility which has finally been arranged for her and now that she is hemodynamically stable and all the medical problems that she came in for hospital have resolved so she will be discharged today.   Discharge Diagnoses:  Principal Problem:   Lung collapse Active Problems:   Small cell lung cancer, left (HCC)   Chronic abdominal pain   Chronic nausea   Chronic  diarrhea   Delirium due to medical condition without behavioral disturbance   AKI (acute kidney injury) [facility])    Discharge Instructions  Discharge Instructions    Discharge patient   Complete by: As directed    Discharge disposition: 03-Skilled [place]   Discharge patient date: 05/14/2019   Discharge patient   Complete by: As directed    Discharge disposition: 03-Skilled [place]   Discharge patient date: 05/17/2019     Allergies as of 05/17/2019      Reactions   Penicillins Hives, Swelling, Other (See Comments)   Reaction:  Face/mouth swelling  Has patient had a PCN reaction causing immediate rash, facial/tongue/throat swelling, SOB or lightheadedness with hypotension: Yes Has patient had a PCN reaction causing severe rash involving mucus membranes or skin necrosis: No Has patient had a PCN reaction that required hospitalization No Has patient had a PCN reaction occurring within the last 10 years: No If all of the above answers are "NO", then may proceed with Cephalosporin use.   Codeine Nausea Only   Fentanyl Nausea And Vomiting   restless   Ranitidine Hcl Other (See Comments), Nausea Only   Reaction:  Dizziness    Keflex [cephalexin] Other (See Comments)   Reaction:  Unknown    Lyrica [pregabalin] Other (See Comments)   Reaction:  Somnolence       Medication List    STOP taking these medications   ertapenem  IVPB Commonly known as: INVANZ     TAKE these medications   albuterol 108 (90 Base) MCG/ACT inhaler Commonly known as: VENTOLIN HFA Inhale 2 puffs into the lungs every 4 (four) hours as needed for wheezing or shortness of breath.   ALPRAZolam 0.25 MG tablet Commonly known as: XANAX Take 2 tablets (0.5 mg total) by mouth at bedtime as needed for anxiety. What changed:   how much to take  when to take this  reasons to take this   amLODipine 5 MG tablet Commonly known as: NORVASC Take 5 mg by mouth daily.   AZO-TABS 95 MG  tablet Generic drug: phenazopyridine Take 190 mg by mouth 2 (two) times a day.   cyclobenzaprine 5 MG tablet Commonly known as: FLEXERIL TAKE ONE TABLET 3 TIMES A DAY AS NEEDED. What changed:   how much to take  how to take this  when to take this  reasons to take this  additional instructions   dicyclomine 20 MG tablet Commonly known as: BENTYL Take 20 mg by mouth 3 (three) times daily as needed for spasms.   diphenoxylate-atropine 2.5-0.025 MG tablet Commonly known as: LOMOTIL TAKE 1 TABLET BY MOUTH 4 TIMES DAILY AS NEEDED FOR LOOSE STOOLS. What changed:   how much to take  how to take this  when to take this  reasons to take this  additional instructions   estazolam 2 MG tablet Commonly known as: PROSOM Take 2 mg by mouth at bedtime.   estradiol 2 MG tablet Commonly known as: ESTRACE Take 2 mg by mouth daily.   FLUoxetine 40 MG capsule Commonly known as: PROZAC Take 40 mg by mouth daily.   HYDROcodone-acetaminophen 5-325 MG tablet Commonly known as: NORCO/VICODIN Take 1 tablet by mouth every 4 (four) hours as needed for moderate pain.   levothyroxine 50 MCG tablet Commonly known as: SYNTHROID Take 50 mcg  by mouth daily before breakfast.   lidocaine-prilocaine cream Commonly known as: EMLA Apply 1 application topically as needed. To numb skin over port a cath: Squeeze a  small amount on cotton ball and place over port site 1-2 hours prior to chemotherapy.   loperamide 2 MG capsule Commonly known as: IMODIUM Take 2 mg by mouth every 2 (two) hours as needed for diarrhea or loose stools.   magnesium oxide 400 (241.3 Mg) MG tablet Commonly known as: MAG-OX Take 1 tablet (400 mg total) by mouth 2 (two) times daily.   metoprolol succinate 25 MG 24 hr tablet Commonly known as: TOPROL-XL Take 25 mg by mouth daily.   ondansetron 4 MG disintegrating tablet Commonly known as: ZOFRAN-ODT Dissolve one tablet by mouth every 8 hours as needed for nausea  and vomiting,   OXYGEN Inhale 2 L into the lungs daily as needed (for shortness of breath).   pantoprazole 40 MG tablet Commonly known as: PROTONIX Take 40 mg by mouth 2 (two) times daily before a meal.   potassium chloride SA 20 MEQ tablet Commonly known as: K-DUR Take 20 mEq by mouth daily as needed (for supplement).   promethazine 25 MG tablet Commonly known as: PHENERGAN TAKE ONE TABLET BY MOUTH EVERY 6 HOURS AS NEEDED. What changed: reasons to take this   senna-docusate 8.6-50 MG tablet Commonly known as: Senokot-S Take 1 tablet by mouth daily as needed for mild constipation or moderate constipation.       Contact information for follow-up providers    Redmond School, MD Follow up in 1 week(s).   Specialty: Internal Medicine Contact information: [address] [place] [address] [phone]            Contact information for after-discharge care    Destination    [facility]erred [facility] .   Service: Skilled Nursing Contact information: [place] [address] [phone]                 Allergies  Allergen Reactions  . Penicillins Hives, Swelling and Other (See Comments)    Reaction:  Face/mouth swelling  Has patient had a PCN reaction causing immediate rash, facial/tongue/throat swelling, SOB or lightheadedness with hypotension: Yes Has patient had a PCN reaction causing severe rash involving mucus membranes or skin necrosis: No Has patient had a PCN reaction that required hospitalization No Has patient had a PCN reaction occurring within the last 10 years: No If all of the above answers are "NO", then may proceed with Cephalosporin use.  . Codeine Nausea Only  . Fentanyl Nausea And Vomiting    restless  . Ranitidine Hcl Other (See Comments) and Nausea Only    Reaction:  Dizziness   . Keflex [Cephalexin] Other (See Comments)    Reaction:  Unknown   . Lyrica [Pregabalin] Other (See Comments)     Reaction:  Somnolence     Consultations: Oncology, radiation oncology, PCCM    Procedures/Studies: Ct Head W & Wo Contrast  Result Date: 05/10/2019 CLINICAL DATA:  Small cell lung cancer. Confusion and hallucinations recently. EXAM: CT HEAD WITHOUT AND WITH CONTRAST TECHNIQUE: Contiguous axial images were obtained from the base of the skull through the vertex without and with intravenous contrast CONTRAST:  43mL OMNIPAQUE IOHEXOL 300 MG/ML  SOLN COMPARISON:  MRI 02/09/2018 FINDINGS: Brain: The brainstem and cerebellum are unremarkable. There are mild chronic small-vessel ischemic changes of the cerebral hemispheric white matter. Old lacunar infarction in the right basal ganglia/internal capsule.  No sign of recent infarction, mass lesion, hemorrhage, hydrocephalus or extra-axial collection. No abnormal enhancement occurs. Vascular: There is atherosclerotic calcification of the major vessels at the base of the brain. Skull: Negative Sinuses/Orbits: Clear/normal Other: None IMPRESSION: No evidence of metastatic disease. No evidence of acute or subacute infarction by CT. Background pattern of chronic small vessel ischemic change. Old lacunar infarction right basal ganglia/internal capsule. Electronically Signed   By: Nelson Chimes M.D.   On: 05/10/2019 14:00   Ct Chest W Contrast  Result Date: 05/07/2019 CLINICAL DATA:  Lung cancer, UTI, chest pain, abdominal pain, vomiting EXAM: CT CHEST, ABDOMEN, AND PELVIS WITH CONTRAST TECHNIQUE: Multidetector CT imaging of the chest, abdomen and pelvis was performed following the standard protocol during bolus administration of intravenous contrast. CONTRAST:  142mL OMNIPAQUE IOHEXOL 300 MG/ML  SOLN COMPARISON:  Same day chest radiograph, CT chest abdomen pelvis, 04/09/2019 FINDINGS: CT CHEST FINDINGS Cardiovascular: No significant vascular findings. Normal heart size. No pericardial effusion. Right chest port catheter. Mediastinum/Nodes: Leftward shift of the  mediastinum. Slight interval increase in size of a left hilar and mediastinal mass about the left mainstem bronchus, with increased mass effect on the left mainstem bronchus. Enlarged subcarinal lymph node and involvement of the midportion of the esophagus by mass. Normal thyroid. Small hiatal hernia. Lungs/Pleura: Near total atelectasis of the left lung secondary to left mainstem bronchial obstruction with a small pleural effusion. Slightly increased ground-glass opacity of the right upper lobe (series 4, image 50). Mild emphysema. Musculoskeletal: No chest wall mass. CT ABDOMEN PELVIS FINDINGS Hepatobiliary: No focal liver abnormality is seen. Status post cholecystectomy with postoperative biliary ductal dilatation. Pancreas: Unremarkable. No pancreatic ductal dilatation or surrounding inflammatory changes. Spleen: Normal in size without significant abnormality. Adrenals/Urinary Tract: Adrenal glands are unremarkable. Kidneys are normal, without renal calculi, solid lesion, or hydronephrosis. Bladder is unremarkable. Stomach/Bowel: Stomach is within normal limits. Appendix appears normal. No evidence of bowel wall thickening, distention, or inflammatory changes. Sigmoid diverticulosis. Vascular/Lymphatic: Calcific atherosclerosis. No enlarged abdominal or pelvic lymph nodes. Reproductive: Status post hysterectomy. Other: No abdominal wall hernia or abnormality. No abdominopelvic ascites. Musculoskeletal: Unchanged sclerotic lesion of the L3 vertebral body. IMPRESSION: 1. Slight interval increase in size of a left hilar and mediastinal mass about the left mainstem bronchus, with increased mass effect on the left mainstem bronchus. Near total atelectasis of the left lung secondary to left mainstem bronchial obstruction with a small pleural effusion. 2. Slightly increased infectious or inflammatory ground-glass opacity of the right upper lobe (series 4, image 50). Mild emphysema. 3.  Unchanged sclerotic lesion of the  L3 vertebral body. 4. Other chronic, incidental, and postoperative findings as detailed above. Electronically Signed   By: Eddie Candle M.D.   On: 05/07/2019 19:53   Ct Abdomen Pelvis W Contrast  Result Date: 05/07/2019 CLINICAL DATA:  Lung cancer, UTI, chest pain, abdominal pain, vomiting EXAM: CT CHEST, ABDOMEN, AND PELVIS WITH CONTRAST TECHNIQUE: Multidetector CT imaging of the chest, abdomen and pelvis was performed following the standard protocol during bolus administration of intravenous contrast. CONTRAST:  147mL OMNIPAQUE IOHEXOL 300 MG/ML  SOLN COMPARISON:  Same day chest radiograph, CT chest abdomen pelvis, 04/09/2019 FINDINGS: CT CHEST FINDINGS Cardiovascular: No significant vascular findings. Normal heart size. No pericardial effusion. Right chest port catheter. Mediastinum/Nodes: Leftward shift of the mediastinum. Slight interval increase in size of a left hilar and mediastinal mass about the left mainstem bronchus, with increased mass effect on the left mainstem bronchus. Enlarged subcarinal lymph node and involvement  of the midportion of the esophagus by mass. Normal thyroid. Small hiatal hernia. Lungs/Pleura: Near total atelectasis of the left lung secondary to left mainstem bronchial obstruction with a small pleural effusion. Slightly increased ground-glass opacity of the right upper lobe (series 4, image 50). Mild emphysema. Musculoskeletal: No chest wall mass. CT ABDOMEN PELVIS FINDINGS Hepatobiliary: No focal liver abnormality is seen. Status post cholecystectomy with postoperative biliary ductal dilatation. Pancreas: Unremarkable. No pancreatic ductal dilatation or surrounding inflammatory changes. Spleen: Normal in size without significant abnormality. Adrenals/Urinary Tract: Adrenal glands are unremarkable. Kidneys are normal, without renal calculi, solid lesion, or hydronephrosis. Bladder is unremarkable. Stomach/Bowel: Stomach is within normal limits. Appendix appears normal. No evidence  of bowel wall thickening, distention, or inflammatory changes. Sigmoid diverticulosis. Vascular/Lymphatic: Calcific atherosclerosis. No enlarged abdominal or pelvic lymph nodes. Reproductive: Status post hysterectomy. Other: No abdominal wall hernia or abnormality. No abdominopelvic ascites. Musculoskeletal: Unchanged sclerotic lesion of the L3 vertebral body. IMPRESSION: 1. Slight interval increase in size of a left hilar and mediastinal mass about the left mainstem bronchus, with increased mass effect on the left mainstem bronchus. Near total atelectasis of the left lung secondary to left mainstem bronchial obstruction with a small pleural effusion. 2. Slightly increased infectious or inflammatory ground-glass opacity of the right upper lobe (series 4, image 50). Mild emphysema. 3.  Unchanged sclerotic lesion of the L3 vertebral body. 4. Other chronic, incidental, and postoperative findings as detailed above. Electronically Signed   By: Eddie Candle M.D.   On: 05/07/2019 19:53   Dg Chest Port 1 View  Result Date: 05/07/2019 CLINICAL DATA:  UTI, lung cancer, emesis EXAM: PORTABLE CHEST 1 VIEW COMPARISON:  04/03/2019 FINDINGS: There is total opacification of the left hemithorax. The cardiac and mediastinal borders are obscured. Right chest port catheter. IMPRESSION: There is total opacification of the left hemithorax, concerning for large left pleural effusion and/or left mainstem bronchial obstruction by hilar mass. Recommend CT to further evaluate. Electronically Signed   By: Eddie Candle M.D.   On: 05/07/2019 17:31     Discharge Exam: Vitals:   05/16/19 2104 05/17/19 0622  BP: 121/84 138/75  Pulse: 81 75  Resp: 19 17  Temp: (!) 97.5 F (36.4 C) 97.7 F (36.5 C)  SpO2: 100% 99%   Vitals:   05/16/19 0601 05/16/19 1436 05/16/19 2104 05/17/19 0622  BP: (!) 145/81 101/79 121/84 138/75  Pulse: 95 87 81 75  Resp: 18 (!) 22 19 17   Temp: 97.7 F (36.5 C) 97.6 F (36.4 C) (!) 97.5 F (36.4 C) 97.7  F (36.5 C)  TempSrc: Oral Oral Oral Oral  SpO2:  94% 100% 99%  Weight:      Height:       General exam: Appears calm and comfortable  Respiratory system: Clear to auscultation. Respiratory effort normal. Cardiovascular system: S1 & S2 heard, RRR. No JVD, murmurs, rubs, gallops or clicks. No pedal edema. Gastrointestinal system: Abdomen is nondistended, soft and nontender. No organomegaly or masses felt. Normal bowel sounds heard. Central nervous system: Alert and oriented. No focal neurological deficits. Extremities: Symmetric 5 x 5 power. Skin: No rashes, lesions or ulcers Psychiatry: Judgement and insight appear normal. Mood & affect appropriate.  The results of significant diagnostics from this hospitalization (including imaging, microbiology, ancillary and laboratory) are listed below for reference.     Microbiology: Recent Results (from the past 240 hour(s))  SARS Coronavirus 2 (CEPHEID - Performed in [place] hospital lab), Hosp Order     Status:  None   Collection Time: 05/07/19  5:32 PM   Specimen: Nasopharyngeal Swab  Result Value Ref Range Status   SARS Coronavirus 2 NEGATIVE NEGATIVE Final    Comment: (NOTE) If result is NEGATIVE SARS-CoV-2 target nucleic acids are NOT DETECTED. The SARS-CoV-2 RNA is generally detectable in upper and lower  respiratory specimens during the acute phase of infection. The lowest  concentration of SARS-CoV-2 viral copies this assay can detect is 250  copies / mL. A negative result does not preclude SARS-CoV-2 infection  and should not be used as the sole basis for treatment or other  patient management decisions.  A negative result may occur with  improper specimen collection / handling, submission of specimen other  than nasopharyngeal swab, presence of viral mutation(s) within the  areas targeted by this assay, and inadequate number of viral copies  (<250 copies / mL). A negative result must be combined with clinical  observations,  patient history, and epidemiological information. If result is POSITIVE SARS-CoV-2 target nucleic acids are DETECTED. The SARS-CoV-2 RNA is generally detectable in upper and lower  respiratory specimens dur ing the acute phase of infection.  Positive  results are indicative of active infection with SARS-CoV-2.  Clinical  correlation with patient history and other diagnostic information is  necessary to determine patient infection status.  Positive results do  not rule out bacterial infection or co-infection with other viruses. If result is PRESUMPTIVE POSTIVE SARS-CoV-2 nucleic acids MAY BE PRESENT.   A presumptive positive result was obtained on the submitted specimen  and confirmed on repeat testing.  While 2019 novel coronavirus  (SARS-CoV-2) nucleic acids may be present in the submitted sample  additional confirmatory testing may be necessary for epidemiological  and / or clinical management purposes  to differentiate between  SARS-CoV-2 and other Sarbecovirus currently known to infect humans.  If clinically indicated additional testing with an alternate test  methodology [phone]) is advised. The SARS-CoV-2 RNA is generally  detectable in upper and lower respiratory sp ecimens during the acute  phase of infection. The expected result is Negative. Fact Sheet for Patients:  StrictlyIdeas.no Fact Sheet for Healthcare Providers: BankingDealers.co.za This test is not yet approved or cleared by the [place] FDA and has been authorized for detection and/or diagnosis of SARS-CoV-2 by FDA under an Emergency Use Authorization (EUA).  This EUA will remain in effect (meaning this test can be used) for the duration of the COVID-19 declaration under Section 564(b)(1) of the Act, 21 U.S.C. section 360bbb-3(b)(1), unless the authorization is terminated or revoked sooner. Performed at [facility], [place] [address], [place], [place] [address]   Urine culture     Status: None   Collection Time: 05/07/19  7:59 PM   Specimen: Urine, Random  Result Value Ref Range Status   Specimen Description   Final    URINE, RANDOM Performed at [facility] [address], [place], [place] [address]    Special Requests   Final    NONE Performed at [facility], [place] [address], [place], [place] [address]    Culture   Final    NO GROWTH Performed at [facility], [place] [address] [place], [place] [address]    Report Status 05/09/2019 FINAL  Final  MRSA PCR Screening     Status: None   Collection Time: 05/07/19 11:17 PM   Specimen: Nasal Mucosa; Nasopharyngeal  Result Value Ref Range Status   MRSA by PCR  NEGATIVE NEGATIVE Final    Comment:        The GeneXpert MRSA Assay (FDA approved for NASAL specimens only), is one component of a comprehensive MRSA colonization surveillance program. It is not intended to diagnose MRSA infection nor to guide or monitor treatment for MRSA infections. Performed at [facility], [place] [address] [place], [place] [address]      Labs: BNP (last 3 results) No results for input(s): BNP in the last 8760 hours. Basic Metabolic Panel: Recent Labs  Lab 05/13/19 1341 05/14/19 1022 05/15/19 0833 05/16/19 0955 05/17/19 0824  NA 141 138 140 140 140  K 3.5 3.0* 3.5 3.4* 3.9  CL 108 107 107 107 108  CO2 23 24 22 23 24   GLUCOSE 101* 108* 115* 103* 119*  BUN 17 17 14 13 10   CREATININE 0.86 0.80 0.83 0.75 0.80  CALCIUM 8.6* 8.3* 8.5* 8.3* 9.0  MG  --  1.5* 1.7 1.4* 1.9   Liver Function Tests: Recent Labs  Lab 05/13/19 1341 05/14/19 1022 05/15/19 0833 05/16/19 0955  AST 12* 12* 13* 12*  ALT 9 10 11 11   ALKPHOS 43 42 41 42  BILITOT 0.9 0.6 0.5 0.4  PROT 6.6 6.4* 6.3* 6.0*  ALBUMIN 3.5 3.2* 3.2* 3.0*   No results for input(s): LIPASE, AMYLASE in the last 168 hours. No results for input(s): AMMONIA in  the last 168 hours. CBC: Recent Labs  Lab 05/12/19 0620 05/13/19 1341 05/14/19 1022 05/15/19 0833 05/16/19 0955  WBC 6.2 6.6 5.2 5.0 3.6*  NEUTROABS  --  5.3  --  3.8 2.5  HGB 8.0* 7.8* 7.7* 7.8* 7.6*  HCT 24.7* 24.5* 23.3* 23.7* 22.7*  MCV 110.8* 109.9* 108.9* 108.2* 107.1*  PLT 131* 143* 129* 151 121*   Cardiac Enzymes: No results for input(s): CKTOTAL, CKMB, CKMBINDEX, TROPONINI in the last 168 hours. BNP: Invalid input(s): POCBNP CBG: No results for input(s): GLUCAP in the last 168 hours. D-Dimer No results for input(s): DDIMER in the last 72 hours. Hgb A1c No results for input(s): HGBA1C in the last 72 hours. Lipid Profile No results for input(s): CHOL, HDL, LDLCALC, TRIG, CHOLHDL, LDLDIRECT in the last 72 hours. Thyroid function studies No results for input(s): TSH, T4TOTAL, T3FREE, THYROIDAB in the last 72 hours.  Invalid input(s): FREET3 Anemia work up No results for input(s): VITAMINB12, FOLATE, FERRITIN, TIBC, IRON, RETICCTPCT in the last 72 hours. Urinalysis    Component Value Date/Time   COLORURINE YELLOW 05/07/2019 1959   APPEARANCEUR CLEAR 05/07/2019 1959   LABSPEC 1.018 05/07/2019 1959   PHURINE 6.0 05/07/2019 1959   GLUCOSEU NEGATIVE 05/07/2019 1959   HGBUR NEGATIVE 05/07/2019 1959   BILIRUBINUR NEGATIVE 05/07/2019 1959   KETONESUR 5 (A) 05/07/2019 1959   PROTEINUR NEGATIVE 05/07/2019 1959   UROBILINOGEN 0.2 03/10/2014 1027   NITRITE NEGATIVE 05/07/2019 1959   LEUKOCYTESUR NEGATIVE 05/07/2019 1959   Sepsis Labs Invalid input(s): PROCALCITONIN,  WBC,  LACTICIDVEN Microbiology Recent Results (from the past 240 hour(s))  SARS Coronavirus 2 (CEPHEID - Performed in [place] hospital lab), Hosp Order     Status: None   Collection Time: 05/07/19  5:32 PM   Specimen: Nasopharyngeal Swab  Result Value Ref Range Status   SARS Coronavirus 2 NEGATIVE NEGATIVE Final    Comment: (NOTE) If result is NEGATIVE SARS-CoV-2 target nucleic acids are NOT  DETECTED. The SARS-CoV-2 RNA is generally detectable in upper and lower  respiratory specimens during the acute phase of infection. The lowest  concentration  of SARS-CoV-2 viral copies this assay can detect is 250  copies / mL. A negative result does not preclude SARS-CoV-2 infection  and should not be used as the sole basis for treatment or other  patient management decisions.  A negative result may occur with  improper specimen collection / handling, submission of specimen other  than nasopharyngeal swab, presence of viral mutation(s) within the  areas targeted by this assay, and inadequate number of viral copies  (<250 copies / mL). A negative result must be combined with clinical  observations, patient history, and epidemiological information. If result is POSITIVE SARS-CoV-2 target nucleic acids are DETECTED. The SARS-CoV-2 RNA is generally detectable in upper and lower  respiratory specimens dur ing the acute phase of infection.  Positive  results are indicative of active infection with SARS-CoV-2.  Clinical  correlation with patient history and other diagnostic information is  necessary to determine patient infection status.  Positive results do  not rule out bacterial infection or co-infection with other viruses. If result is PRESUMPTIVE POSTIVE SARS-CoV-2 nucleic acids MAY BE PRESENT.   A presumptive positive result was obtained on the submitted specimen  and confirmed on repeat testing.  While 2019 novel coronavirus  (SARS-CoV-2) nucleic acids may be present in the submitted sample  additional confirmatory testing may be necessary for epidemiological  and / or clinical management purposes  to differentiate between  SARS-CoV-2 and other Sarbecovirus currently known to infect humans.  If clinically indicated additional testing with an alternate test  methodology [phone]) is advised. The SARS-CoV-2 RNA is generally  detectable in upper and lower respiratory sp ecimens during  the acute  phase of infection. The expected result is Negative. Fact Sheet for Patients:  StrictlyIdeas.no Fact Sheet for Healthcare Providers: BankingDealers.co.za This test is not yet approved or cleared by the [place] FDA and has been authorized for detection and/or diagnosis of SARS-CoV-2 by FDA under an Emergency Use Authorization (EUA).  This EUA will remain in effect (meaning this test can be used) for the duration of the COVID-19 declaration under Section 564(b)(1) of the Act, 21 U.S.C. section 360bbb-3(b)(1), unless the authorization is terminated or revoked sooner. Performed at [facility], [place] [address] [place], [place] [address]   Urine culture     Status: None   Collection Time: 05/07/19  7:59 PM   Specimen: Urine, Random  Result Value Ref Range Status   Specimen Description   Final    URINE, RANDOM Performed at [place] [address] [place], [place] [address]    Special Requests   Final    NONE Performed at [facility], [place] [address] [place], [place] [address]    Culture   Final    NO GROWTH Performed at [facility], [place] [address], [place], [place] [address]    Report Status 05/09/2019 FINAL  Final  MRSA PCR Screening     Status: None   Collection Time: 05/07/19 11:17 PM   Specimen: Nasal Mucosa; Nasopharyngeal  Result Value Ref Range Status   MRSA by PCR NEGATIVE NEGATIVE Final    Comment:        The GeneXpert MRSA Assay (FDA approved for NASAL specimens only), is one component of a comprehensive MRSA colonization surveillance program. It is not intended to diagnose MRSA infection nor to guide or monitor treatment for MRSA infections. Performed at [facility], [place] [address] [place], [place] [address]  Time coordinating discharge: 35 minutes  SIGNED:   Darliss Cheney, MD  [facility] 05/17/2019, 11:20 AM Pager [phone]  If 7PM-7AM, please contact night-coverage [email or web address] Password [email or web address]

## 2019-05-17 NOTE — Progress Notes (Signed)
This RN attempted to call to report x2 to RN at receiving facility [facility]. Will attempt to call again when transportation arrives.

## 2019-05-17 NOTE — TOC Transition Note (Signed)
Transition of Care [facility]) - CM/SW Discharge Note   Patient Details  Name: Rebekah Henderson MRN: [id number] Date of Birth: February 08, 1956  Transition of Care [facility]) CM/SW Contact:  Lia Hopping, [place] Phone Number: 05/17/2019, 10:58 AM   Clinical Narrative:    Patient discharge SNF-[facility] PTAR to transport.  Nurse call report to: [phone]   Final next level of care: Skilled Nursing Facility Barriers to Discharge: No Barriers Identified   Patient Goals and CMS Choice Patient states their goals for this hospitalization and ongoing recovery are:: to go home and get well CMS Medicare.gov Compare Post Acute Care list provided to:: Patient Choice offered to / list presented to : Patient  Discharge Placement                Patient to be transferred to facility by: [facility] Name of family member notified: Drue Dun Patient and family notified of of transfer: 05/17/19  Discharge Plan and Services   Discharge Planning Services: CM Consult Post Acute Care Choice: Home Health, Durable Medical Equipment          DME Arranged: Gilford Rile rolling, 3-N-1 DME Agency: [employer] Date DME Agency Contacted: 05/13/19 Time DME Agency Contacted: 0900 Representative spoke with at DME Agency: nathan HH Arranged: RN, PT [place] Agency: [place] ([place]) Date HH Agency Contacted: 05/13/19 Time [place]: 1532 Representative spoke with at [place]: [place] ([place]) Interventions     Readmission Risk Interventions Readmission Risk Prevention Plan 05/05/2019  Transportation Screening Complete  PCP or Specialist Appt within 3-5 Days Complete  HRI or [place] Complete  Social Work Consult for [place] Planning/Counseling Complete  Palliative Care Screening Not Applicable  Medication Review Press photographer) Complete  Some recent data might be hidden

## 2019-05-17 NOTE — Telephone Encounter (Signed)
appts-Frances is aware of Pansey's discharge to [facility] for rehab . I gave her Kash's next appt and sent schedule message to call  [facility] with appts.

## 2019-05-17 NOTE — Consult Note (Signed)
   [facility] Inpatient Consult   05/17/2019  Rebekah Henderson [birth year] [id number]   [facility] Care Management Follow up:  Patient chart reviewed for [facility] CM services due to extreme readmission risk score, now at 42%, and unplanned 30 day readmission.  Chart review reveals current disposition plan is for SNF. No [facility] identifiable needs.  Netta Cedars, MSN, [facility] Liaison Nurse Mobile Phone [phone]  Toll free office [phone]

## 2019-05-17 NOTE — Telephone Encounter (Signed)
Scheduled appt per 6/15 sch message - pt friend [name] aware ofappts and message left for [facility] for transportation.,

## 2019-05-18 ENCOUNTER — Other Ambulatory Visit: Payer: Self-pay

## 2019-05-18 ENCOUNTER — Ambulatory Visit
Admission: RE | Admit: 2019-05-18 | Discharge: 2019-05-18 | Disposition: A | Discharge status: Home / Self Care | Payer: Medicare Other | Source: Ambulatory Visit | Attending: Radiation Oncology | Admitting: Radiation Oncology

## 2019-05-18 DIAGNOSIS — C3412 Malignant neoplasm of upper lobe, left bronchus or lung: Secondary | ICD-10-CM | POA: Diagnosis not present

## 2019-05-18 DIAGNOSIS — Z510 Encounter for antineoplastic radiation therapy: Secondary | ICD-10-CM | POA: Diagnosis not present

## 2019-05-19 ENCOUNTER — Ambulatory Visit
Admission: RE | Admit: 2019-05-19 | Discharge: 2019-05-19 | Disposition: A | Discharge status: Home / Self Care | Payer: Medicare Other | Source: Ambulatory Visit | Attending: Radiation Oncology | Admitting: Radiation Oncology

## 2019-05-19 ENCOUNTER — Other Ambulatory Visit: Payer: Self-pay

## 2019-05-19 ENCOUNTER — Encounter: Payer: Self-pay | Admitting: Radiation Oncology

## 2019-05-19 DIAGNOSIS — Z510 Encounter for antineoplastic radiation therapy: Secondary | ICD-10-CM | POA: Diagnosis not present

## 2019-05-19 NOTE — Progress Notes (Signed)
To clarify, the patient was not see face to face but was discussed via telephone during her hospital consultation on 05/11/2019.     Carola Rhine, PAC

## 2019-05-20 ENCOUNTER — Ambulatory Visit
Admission: RE | Admit: 2019-05-20 | Discharge: 2019-05-20 | Disposition: A | Discharge status: Home / Self Care | Payer: Medicare Other | Source: Ambulatory Visit | Attending: Radiation Oncology | Admitting: Radiation Oncology

## 2019-05-20 DIAGNOSIS — Z510 Encounter for antineoplastic radiation therapy: Secondary | ICD-10-CM | POA: Diagnosis not present

## 2019-05-21 ENCOUNTER — Other Ambulatory Visit: Payer: Self-pay

## 2019-05-21 ENCOUNTER — Ambulatory Visit
Admission: RE | Admit: 2019-05-21 | Discharge: 2019-05-21 | Disposition: A | Discharge status: Home / Self Care | Payer: Medicare Other | Source: Ambulatory Visit | Attending: Radiation Oncology | Admitting: Radiation Oncology

## 2019-05-21 ENCOUNTER — Other Ambulatory Visit: Payer: Self-pay | Admitting: Radiation Oncology

## 2019-05-21 DIAGNOSIS — Z510 Encounter for antineoplastic radiation therapy: Secondary | ICD-10-CM | POA: Diagnosis not present

## 2019-05-21 MED ORDER — SUCRALFATE 1 G PO TABS: 1.0000 g | ORAL_TABLET | Freq: Four times a day (QID) | ORAL | 0 refills | Status: AC

## 2019-05-24 ENCOUNTER — Inpatient Hospital Stay: Payer: Medicare Other

## 2019-05-24 ENCOUNTER — Encounter: Payer: Self-pay | Admitting: Internal Medicine

## 2019-05-24 ENCOUNTER — Other Ambulatory Visit: Payer: Self-pay | Admitting: *Deleted

## 2019-05-24 ENCOUNTER — Ambulatory Visit
Admission: RE | Admit: 2019-05-24 | Discharge: 2019-05-24 | Disposition: A | Discharge status: Home / Self Care | Payer: Medicare Other | Source: Ambulatory Visit | Attending: Radiation Oncology | Admitting: Radiation Oncology

## 2019-05-24 ENCOUNTER — Inpatient Hospital Stay (HOSPITAL_BASED_OUTPATIENT_CLINIC_OR_DEPARTMENT_OTHER): Payer: Medicare Other | Admitting: Internal Medicine

## 2019-05-24 ENCOUNTER — Other Ambulatory Visit: Payer: Self-pay

## 2019-05-24 ENCOUNTER — Encounter: Payer: Self-pay | Admitting: Radiation Oncology

## 2019-05-24 VITALS — BP 134/78 | HR 73 | Temp 98.0°F | Resp 19

## 2019-05-24 DIAGNOSIS — R51 Headache: Secondary | ICD-10-CM | POA: Diagnosis not present

## 2019-05-24 DIAGNOSIS — E039 Hypothyroidism, unspecified: Secondary | ICD-10-CM | POA: Diagnosis not present

## 2019-05-24 DIAGNOSIS — C3432 Malignant neoplasm of lower lobe, left bronchus or lung: Secondary | ICD-10-CM

## 2019-05-24 DIAGNOSIS — Z880 Allergy status to penicillin: Secondary | ICD-10-CM

## 2019-05-24 DIAGNOSIS — R112 Nausea with vomiting, unspecified: Secondary | ICD-10-CM

## 2019-05-24 DIAGNOSIS — M6281 Muscle weakness (generalized): Secondary | ICD-10-CM

## 2019-05-24 DIAGNOSIS — J9811 Atelectasis: Secondary | ICD-10-CM

## 2019-05-24 DIAGNOSIS — R222 Localized swelling, mass and lump, trunk: Secondary | ICD-10-CM

## 2019-05-24 DIAGNOSIS — C3412 Malignant neoplasm of upper lobe, left bronchus or lung: Secondary | ICD-10-CM

## 2019-05-24 DIAGNOSIS — R0609 Other forms of dyspnea: Secondary | ICD-10-CM | POA: Diagnosis not present

## 2019-05-24 DIAGNOSIS — Z9221 Personal history of antineoplastic chemotherapy: Secondary | ICD-10-CM

## 2019-05-24 DIAGNOSIS — D649 Anemia, unspecified: Secondary | ICD-10-CM

## 2019-05-24 DIAGNOSIS — Z5111 Encounter for antineoplastic chemotherapy: Secondary | ICD-10-CM

## 2019-05-24 DIAGNOSIS — Z79899 Other long term (current) drug therapy: Secondary | ICD-10-CM | POA: Diagnosis not present

## 2019-05-24 DIAGNOSIS — N390 Urinary tract infection, site not specified: Secondary | ICD-10-CM | POA: Diagnosis not present

## 2019-05-24 DIAGNOSIS — Z881 Allergy status to other antibiotic agents status: Secondary | ICD-10-CM

## 2019-05-24 DIAGNOSIS — J439 Emphysema, unspecified: Secondary | ICD-10-CM | POA: Diagnosis not present

## 2019-05-24 DIAGNOSIS — E876 Hypokalemia: Secondary | ICD-10-CM

## 2019-05-24 DIAGNOSIS — Z510 Encounter for antineoplastic radiation therapy: Secondary | ICD-10-CM | POA: Diagnosis not present

## 2019-05-24 DIAGNOSIS — I10 Essential (primary) hypertension: Secondary | ICD-10-CM

## 2019-05-24 DIAGNOSIS — Z8673 Personal history of transient ischemic attack (TIA), and cerebral infarction without residual deficits: Secondary | ICD-10-CM | POA: Diagnosis not present

## 2019-05-24 DIAGNOSIS — K5730 Diverticulosis of large intestine without perforation or abscess without bleeding: Secondary | ICD-10-CM | POA: Diagnosis not present

## 2019-05-24 DIAGNOSIS — J90 Pleural effusion, not elsewhere classified: Secondary | ICD-10-CM | POA: Diagnosis not present

## 2019-05-24 DIAGNOSIS — Z885 Allergy status to narcotic agent status: Secondary | ICD-10-CM

## 2019-05-24 DIAGNOSIS — Z9049 Acquired absence of other specified parts of digestive tract: Secondary | ICD-10-CM

## 2019-05-24 DIAGNOSIS — C771 Secondary and unspecified malignant neoplasm of intrathoracic lymph nodes: Secondary | ICD-10-CM

## 2019-05-24 DIAGNOSIS — R634 Abnormal weight loss: Secondary | ICD-10-CM | POA: Diagnosis not present

## 2019-05-24 DIAGNOSIS — T451X5A Adverse effect of antineoplastic and immunosuppressive drugs, initial encounter: Secondary | ICD-10-CM

## 2019-05-24 DIAGNOSIS — D6481 Anemia due to antineoplastic chemotherapy: Secondary | ICD-10-CM

## 2019-05-24 DIAGNOSIS — Z86010 Personal history of colonic polyps: Secondary | ICD-10-CM

## 2019-05-24 DIAGNOSIS — C3492 Malignant neoplasm of unspecified part of left bronchus or lung: Secondary | ICD-10-CM

## 2019-05-24 DIAGNOSIS — R5383 Other fatigue: Secondary | ICD-10-CM

## 2019-05-24 DIAGNOSIS — E860 Dehydration: Secondary | ICD-10-CM

## 2019-05-24 DIAGNOSIS — J188 Other pneumonia, unspecified organism: Secondary | ICD-10-CM

## 2019-05-24 LAB — CBC WITH DIFFERENTIAL (CANCER CENTER ONLY)
Abs Immature Granulocytes: 0.04 10*3/uL (ref 0.00–0.07)
Basophils Absolute: 0 10*3/uL (ref 0.0–0.1)
Basophils Relative: 0 %
Eosinophils Absolute: 0.1 10*3/uL (ref 0.0–0.5)
Eosinophils Relative: 2 %
HCT: 24.1 % — ABNORMAL LOW (ref 36.0–46.0)
Hemoglobin: 7.9 g/dL — ABNORMAL LOW (ref 12.0–15.0)
Immature Granulocytes: 1 %
Lymphocytes Relative: 11 %
Lymphs Abs: 0.7 10*3/uL (ref 0.7–4.0)
MCH: 35.4 pg — ABNORMAL HIGH (ref 26.0–34.0)
MCHC: 32.8 g/dL (ref 30.0–36.0)
MCV: 108.1 fL — ABNORMAL HIGH (ref 80.0–100.0)
Monocytes Absolute: 0.5 10*3/uL (ref 0.1–1.0)
Monocytes Relative: 8 %
Neutro Abs: 4.7 10*3/uL (ref 1.7–7.7)
Neutrophils Relative %: 78 %
Platelet Count: 156 10*3/uL (ref 150–400)
RBC: 2.23 MIL/uL — ABNORMAL LOW (ref 3.87–5.11)
RDW: 15 % (ref 11.5–15.5)
WBC Count: 6.1 10*3/uL (ref 4.0–10.5)
nRBC: 0 % (ref 0.0–0.2)

## 2019-05-24 LAB — CMP (CANCER CENTER ONLY)
ALT: 9 U/L (ref 0–44)
AST: 12 U/L — ABNORMAL LOW (ref 15–41)
Albumin: 3.2 g/dL — ABNORMAL LOW (ref 3.5–5.0)
Alkaline Phosphatase: 50 U/L (ref 38–126)
Anion gap: 10 (ref 5–15)
BUN: 15 mg/dL (ref 8–23)
CO2: 26 mmol/L (ref 22–32)
Calcium: 7.3 mg/dL — ABNORMAL LOW (ref 8.9–10.3)
Chloride: 105 mmol/L (ref 98–111)
Creatinine: 1.22 mg/dL — ABNORMAL HIGH (ref 0.44–1.00)
GFR, Est AFR Am: 54 mL/min — ABNORMAL LOW (ref >60)
GFR, Estimated: 46 mL/min — ABNORMAL LOW (ref >60)
Glucose, Bld: 86 mg/dL (ref 70–99)
Potassium: 3.3 mmol/L — ABNORMAL LOW (ref 3.5–5.1)
Sodium: 141 mmol/L (ref 135–145)
Total Bilirubin: 0.4 mg/dL (ref 0.3–1.2)
Total Protein: 6.1 g/dL — ABNORMAL LOW (ref 6.5–8.1)

## 2019-05-24 LAB — PREPARE RBC (CROSSMATCH)

## 2019-05-24 LAB — MAGNESIUM: Magnesium: 1 mg/dL — CL (ref 1.7–2.4)

## 2019-05-24 MED ORDER — HEPARIN SOD (PORK) LOCK FLUSH 100 UNIT/ML IV SOLN
500.0000 [IU] | Freq: Once | INTRAVENOUS | Status: DC | PRN
  Filled 2019-05-24: qty 5

## 2019-05-24 MED ORDER — SODIUM CHLORIDE 0.9% IV SOLUTION
250.0000 mL | Freq: Once | INTRAVENOUS | Status: AC
  Administered 2019-05-24: 250 mL via INTRAVENOUS
  Filled 2019-05-24: qty 250

## 2019-05-24 MED ORDER — POTASSIUM CHLORIDE 10 MEQ/100ML IV SOLN: 10.0000 meq | INTRAVENOUS | Status: DC

## 2019-05-24 MED ORDER — ONDANSETRON HCL 4 MG/2ML IJ SOLN
INTRAMUSCULAR | Status: AC
  Filled 2019-05-24: qty 4

## 2019-05-24 MED ORDER — ACETAMINOPHEN 325 MG PO TABS
ORAL_TABLET | ORAL | Status: AC
  Filled 2019-05-24: qty 2

## 2019-05-24 MED ORDER — POTASSIUM CHLORIDE 10 MEQ/100ML IV SOLN
10.0000 meq | INTRAVENOUS | Status: AC
  Administered 2019-05-24: 10 meq via INTRAVENOUS
  Filled 2019-05-24: qty 100

## 2019-05-24 MED ORDER — DIPHENHYDRAMINE HCL 25 MG PO CAPS
ORAL_CAPSULE | ORAL | Status: AC
  Filled 2019-05-24: qty 1

## 2019-05-24 MED ORDER — ACETAMINOPHEN 325 MG PO TABS
650.0000 mg | ORAL_TABLET | Freq: Once | ORAL | Status: AC
  Administered 2019-05-24: 650 mg via ORAL

## 2019-05-24 MED ORDER — SODIUM CHLORIDE 0.9% FLUSH
10.0000 mL | INTRAVENOUS | Status: DC | PRN
  Administered 2019-05-24: 10 mL
  Filled 2019-05-24: qty 10

## 2019-05-24 MED ORDER — SODIUM CHLORIDE 0.9% FLUSH
10.0000 mL | INTRAVENOUS | Status: AC | PRN
  Administered 2019-05-24: 10 mL
  Filled 2019-05-24: qty 10

## 2019-05-24 MED ORDER — PROMETHAZINE HCL 25 MG/ML IJ SOLN
INTRAMUSCULAR | Status: AC
  Filled 2019-05-24: qty 1

## 2019-05-24 MED ORDER — PROMETHAZINE HCL 25 MG/ML IJ SOLN
12.5000 mg | Freq: Once | INTRAMUSCULAR | Status: AC
  Administered 2019-05-24: 12.5 mg via INTRAVENOUS

## 2019-05-24 MED ORDER — SODIUM CHLORIDE 0.9 % IV SOLN
6.0000 g | Freq: Once | INTRAVENOUS | Status: AC
  Administered 2019-05-24: 6 g via INTRAVENOUS
  Filled 2019-05-24: qty 12

## 2019-05-24 MED ORDER — SODIUM CHLORIDE 0.9 % IV SOLN: 6.0000 g | Freq: Once | INTRAVENOUS | Status: DC

## 2019-05-24 MED ORDER — DIPHENHYDRAMINE HCL 25 MG PO CAPS
25.0000 mg | ORAL_CAPSULE | Freq: Once | ORAL | Status: AC
  Administered 2019-05-24: 25 mg via ORAL

## 2019-05-24 MED ORDER — ONDANSETRON HCL 4 MG/2ML IJ SOLN
8.0000 mg | Freq: Once | INTRAMUSCULAR | Status: AC
  Administered 2019-05-24: 8 mg via INTRAVENOUS

## 2019-05-24 MED ORDER — HEPARIN SOD (PORK) LOCK FLUSH 100 UNIT/ML IV SOLN
500.0000 [IU] | Freq: Every day | INTRAVENOUS | Status: AC | PRN
  Administered 2019-05-24: 500 [IU]
  Filled 2019-05-24: qty 5

## 2019-05-24 NOTE — Patient Instructions (Signed)
Blood Transfusion, Adult, Care After This sheet gives you information about how to care for yourself after your procedure. Your doctor may also give you more specific instructions. If you have problems or questions, contact your doctor. Follow these instructions at home:   Take over-the-counter and prescription medicines only as told by your doctor.  Go back to your normal activities as told by your doctor.  Follow instructions from your doctor about how to take care of the area where an IV tube was put into your vein (insertion site). Make sure you: ? Wash your hands with soap and water before you change your bandage (dressing). If there is no soap and water, use hand sanitizer. ? Change your bandage as told by your doctor.  Check your IV insertion site every day for signs of infection. Check for: ? More redness, swelling, or pain. ? More fluid or blood. ? Warmth. ? Pus or a bad smell. Contact a doctor if:  You have more redness, swelling, or pain around the IV insertion site.  You have more fluid or blood coming from the IV insertion site.  Your IV insertion site feels warm to the touch.  You have pus or a bad smell coming from the IV insertion site.  Your pee (urine) turns pink, red, or brown.  You feel weak after doing your normal activities. Get help right away if:  You have signs of a serious allergic or body defense (immune) system reaction, including: ? Itchiness. ? Hives. ? Trouble breathing. ? Anxiety. ? Pain in your chest or lower back. ? Fever, flushing, and chills. ? Fast pulse. ? Rash. ? Watery poop (diarrhea). ? Throwing up (vomiting). ? Dark pee. ? Serious headache. ? Dizziness. ? Stiff neck. ? Yellow color in your face or the white parts of your eyes (jaundice). Summary  After a blood transfusion, return to your normal activities as told by your doctor.  Every day, check for signs of infection where the IV tube was put into your vein.  Some  signs of infection are warm skin, more redness and pain, more fluid or blood, and pus or a bad smell where the needle went in.  Contact your doctor if you feel weak or have any unusual symptoms. This information is not intended to replace advice given to you by your health care provider. Make sure you discuss any questions you have with your health care provider. Document Released: 12/09/2014 Document Revised: 07/12/2016 Document Reviewed: 07/12/2016 Elsevier Interactive Patient Education  2019 [employer].  Hypomagnesemia Hypomagnesemia is a condition in which the level of magnesium in the blood is low. Magnesium is a mineral that is found in many foods. It is used in many different processes in the body. Hypomagnesemia can affect every organ in the body. In severe cases, it can cause life-threatening problems. What are the causes? This condition may be caused by:  Not getting enough magnesium in your diet.  Malnutrition.  Problems with absorbing magnesium from the intestines.  Dehydration.  Alcohol abuse.  Vomiting.  Severe or chronic diarrhea.  Some medicines, including medicines that make you urinate more (diuretics).  Certain diseases, such as kidney disease, diabetes, celiac disease, and overactive thyroid. What are the signs or symptoms? Symptoms of this condition include:  Loss of appetite.  Nausea and vomiting.  Involuntary shaking or trembling of a body part (tremor).  Muscle weakness.  Tingling in the arms and legs.  Sudden tightening of muscles (muscle spasms).  Confusion.  Psychiatric  issues, such as depression, irritability, or psychosis.  A feeling of fluttering of the heart.  Seizures. These symptoms are more severe if magnesium levels drop suddenly. How is this diagnosed? This condition may be diagnosed based on:  Your symptoms and medical history.  A physical exam.  Blood and urine tests. How is this treated? Treatment depends on the  cause and the severity of the condition. It may be treated with:  A magnesium supplement. This can be taken in pill form. If the condition is severe, magnesium is usually given through an IV.  Changes to your diet. You may be directed to eat foods that have a lot of magnesium, such as green leafy vegetables, peas, beans, and nuts.  Stopping any intake of alcohol. Follow these instructions at home:      Make sure that your diet includes foods with magnesium. Foods that have a lot of magnesium in them include: ? Green leafy vegetables, such as spinach and broccoli. ? Beans and peas. ? Nuts and seeds, such as almonds and sunflower seeds. ? Whole grains, such as whole grain bread and fortified cereals.  Take magnesium supplements if your health care provider tells you to do that. Take them as directed.  Take over-the-counter and prescription medicines only as told by your health care provider.  Have your magnesium levels monitored as told by your health care provider.  When you are active, drink fluids that contain electrolytes.  Avoid drinking alcohol.  Keep all follow-up visits as told by your health care provider. This is important. Contact a health care provider if:  You get worse instead of better.  Your symptoms return. Get help right away if you:  Develop severe muscle weakness.  Have trouble breathing.  Feel that your heart is racing. Summary  Hypomagnesemia is a condition in which the level of magnesium in the blood is low.  Hypomagnesemia can affect every organ in the body.  Treatment may include eating more foods that contain magnesium, taking magnesium supplements, and not drinking alcohol.  Have your magnesium levels monitored as told by your health care provider. This information is not intended to replace advice given to you by your health care provider. Make sure you discuss any questions you have with your health care provider. Document Released:  08/14/2005 Document Revised: 10/20/2017 Document Reviewed: 10/20/2017 Elsevier Interactive Patient Education  2019 [place].  Hypokalemia Hypokalemia means that the amount of potassium in the blood is lower than normal.Potassium is a chemical that helps regulate the amount of fluid in the body (electrolyte). It also stimulates muscle tightening (contraction) and helps nerves work properly.Normally, most of the body's potassium is inside of cells, and only a very small amount is in the blood. Because the amount in the blood is so small, minor changes to potassium levels in the blood can be life-threatening. What are the causes? This condition may be caused by:  Antibiotic medicine.  Diarrhea or vomiting. Taking too much of a medicine that helps you have a bowel movement (laxative) can cause diarrhea and lead to hypokalemia.  Chronic kidney disease (CKD).  Medicines that help the body get rid of excess fluid (diuretics).  Eating disorders, such as bulimia.  Low magnesium levels in the body.  Sweating a lot. What are the signs or symptoms? Symptoms of this condition include:  Weakness.  Constipation.  Fatigue.  Muscle cramps.  Mental confusion.  Skipped heartbeats or irregular heartbeat (palpitations).  Tingling or numbness. How is this diagnosed? This  condition is diagnosed with a blood test. How is this treated? Hypokalemia can be treated by taking potassium supplements by mouth or adjusting the medicines that you take. Treatment may also include eating more foods that contain a lot of potassium. If your potassium level is very low, you may need to get potassium through an IV tube in one of your veins and be monitored in the hospital. Follow these instructions at home:   Take over-the-counter and prescription medicines only as told by your health care provider. This includes vitamins and supplements.  Eat a healthy diet. A healthy diet includes fresh fruits and  vegetables, whole grains, healthy fats, and lean proteins.  If instructed, eat more foods that contain a lot of potassium, such as: ? Nuts, such as peanuts and pistachios. ? Seeds, such as sunflower seeds and pumpkin seeds. ? Peas, lentils, and lima beans. ? Whole grain and bran cereals and breads. ? Fresh fruits and vegetables, such as apricots, avocado, bananas, cantaloupe, kiwi, oranges, tomatoes, asparagus, and potatoes. ? Orange juice. ? Tomato juice. ? Red meats. ? Yogurt.  Keep all follow-up visits as told by your health care provider. This is important. Contact a health care provider if:  You have weakness that gets worse.  You feel your heart pounding or racing.  You vomit.  You have diarrhea.  You have diabetes (diabetes mellitus) and you have trouble keeping your blood sugar (glucose) in your target range. Get help right away if:  You have chest pain.  You have shortness of breath.  You have vomiting or diarrhea that lasts for more than 2 days.  You faint. This information is not intended to replace advice given to you by your health care provider. Make sure you discuss any questions you have with your health care provider. Document Released: 11/18/2005 Document Revised: 07/06/2016 Document Reviewed: 07/06/2016 Elsevier Interactive Patient Education  2019 [place] (COVID-19) Are you at risk?  Are you at risk for the Coronavirus (COVID-19)?  To be considered HIGH RISK for Coronavirus (COVID-19), you have to meet the following criteria:  . Traveled to [place], [place], [place], [place] or [place]; or in the [place] to [place], [place], [place], or [place]; and have fever, cough, and shortness of breath within the last 2 weeks of travel OR . Been in close contact with a person diagnosed with COVID-19 within the last 2 weeks and have fever, cough, and shortness of breath . IF YOU DO NOT MEET THESE CRITERIA, YOU ARE CONSIDERED LOW  RISK FOR COVID-19.  What to do if you are HIGH RISK for COVID-19?  Marland Kitchen If you are having a medical emergency, call 911. . Seek medical care right away. Before you go to a doctor's office, urgent care or emergency department, call ahead and tell them about your recent travel, contact with someone diagnosed with COVID-19, and your symptoms. You should receive instructions from your physician's office regarding next steps of care.  . When you arrive at healthcare provider, tell the healthcare staff immediately you have returned from visiting [place], [place], [place], [place] or [place]; or traveled in the [place] to [place], [place], [place], or [place]; in the last two weeks or you have been in close contact with a person diagnosed with COVID-19 in the last 2 weeks.   . Tell the health care staff about your symptoms: fever, cough and shortness of breath. . After you have been seen by a medical  provider, you will be either: o Tested for (COVID-19) and discharged home on quarantine except to seek medical care if symptoms worsen, and asked to  - Stay home and avoid contact with others until you get your results (4-5 days)  - Avoid travel on public transportation if possible (such as bus, train, or airplane) or o Sent to the Emergency Department by EMS for evaluation, COVID-19 testing, and possible admission depending on your condition and test results.  What to do if you are LOW RISK for COVID-19?  Reduce your risk of any infection by using the same precautions used for avoiding the common cold or flu:  Marland Kitchen Wash your hands often with soap and warm water for at least 20 seconds.  If soap and water are not readily available, use an alcohol-based hand sanitizer with at least 60% alcohol.  . If coughing or sneezing, cover your mouth and nose by coughing or sneezing into the elbow areas of your shirt or coat, into a tissue or into your sleeve (not your hands). . Avoid shaking hands with others and  consider head nods or verbal greetings only. . Avoid touching your eyes, nose, or mouth with unwashed hands.  . Avoid close contact with people who are sick. . Avoid places or events with large numbers of people in one location, like concerts or sporting events. . Carefully consider travel plans you have or are making. . If you are planning any travel outside or inside the [place], visit the CDC's Travelers' Health webpage for the latest health notices. . If you have some symptoms but not all symptoms, continue to monitor at home and seek medical attention if your symptoms worsen. . If you are having a medical emergency, call 911.   [place] / e-Visit: [email or web address]         [facility]: [facility]: [phone]                   [facility]: [phone]

## 2019-05-24 NOTE — Progress Notes (Signed)
[place] Telephone:([phone]) [phone]   Fax:([phone]) [phone]  OFFICE PROGRESS NOTE  Redmond School, MD [address] [place] [address]  DIAGNOSIS: Metastatic small cell lung cancer initially diagnosed as Limited stage (T1b, N2, M0) small cell lung cancer presented with left lower lobe/infrahilar mass and large mediastinal lymphadenopathy diagnosed in October 2017.  PRIOR THERAPY: 1) Systemic chemotherapy with cisplatin 60 MG/M2 on day 1 and etoposide 120 MG/M2 on days 1, 2 and 3 status post 1 cycle. This was discontinued secondary to intolerance. 2) Systemic chemotherapy with carboplatin for AUC of 4 on day 1 and etoposide 100 MG/M2 on days 1, 2 and 3 with Neulasta support on day 4 every 3 weeks. Status post 5 cycles. This was concurrent with radiation [place] [facility] [place]. 3) prophylactic cranial irradiation. 4) Systemic chemotherapy with carboplatin for AUC of 5 on day 1, etoposide 100 mg/M2 on days 1, 2 and 3 as well as a Tecentriq (Atezolizumab) 1200 mg IV every 3 weeks with Neulasta support.  First dose February 17, 2018.  Status post 6 cycles of partial response. 5) Maintenance treatment with immunotherapy with Tecentriq 1200 mg IV every 3 weeks.  First dose June 30, 2018.  Status post 6 cycles.  Last dose was given October 13, 2018 discontinued secondary to disease progression.   CURRENT THERAPY: Systemic chemotherapy with cisplatin 30 mg/M2 and irinotecan 65 mg/M2 on days 1 and 8 every 3 weeks.  First dose November 10, 2018.  Starting cycle #2 her dose of cisplatin was reduced to 25 mg/M2 and irinotecan 50 mg/M2 on days 1 and 8 every 3 weeks.  Status post 6 cycles.   INTERVAL HISTORY: Rebekah Henderson 63 y.o. female returns to the clinic today for follow-up visit.  The patient was recently admitted to the hospital twice with urinary tract infection and postobstructive pneumonia.  She had bronchoscopy during her hospitalization at [facility] that showed  external compression of the bronchi in the right upper lobe.  The patient was referred to radiation oncology and she started the palliative course of radiotherapy to this area and she is feeling much better.  She is currently a resident of the skilled nursing facility for rehabilitation.  She is feeling better and she wants to go home.  She has been off treatment for several weeks.  She presented today for evaluation and recommendation regarding treatment of her condition.  She lost a lot of weight in the last few weeks.  MEDICAL HISTORY: Past Medical History:  Diagnosis Date   Antineoplastic chemotherapy induced anemia 12/03/2016   Anxiety    takes Prozac daily   Arthritis    Benign fundic gland polyps of stomach    Colon polyps    COPD (chronic obstructive pulmonary disease) ([place])    Dehydration 03/06/2017   Diverticulitis    Dyspnea    with exertion   Encounter for antineoplastic chemotherapy 12/03/2016   Fibromyalgia    GERD (gastroesophageal reflux disease)    takes Pantoprazole daily   Hypertension    takes Metoprolol,Triamterene-HCTZ,and Amlodipine daily   Hypothyroidism    takes Synthroid daily   IBS (irritable bowel syndrome)    lung ca dx'd 10/02/2016   skin, lung   PONV (postoperative nausea and vomiting)    pt also states that she had some difficulty breathing after cervical fusion    ALLERGIES:  is allergic to penicillins; codeine; fentanyl; ranitidine hcl; keflex [cephalexin]; and lyrica [pregabalin].  MEDICATIONS:  Current  Outpatient Medications  Medication Sig Dispense Refill   albuterol (PROVENTIL HFA;VENTOLIN HFA) 108 (90 Base) MCG/ACT inhaler Inhale 2 puffs into the lungs every 4 (four) hours as needed for wheezing or shortness of breath.     ALPRAZolam (XANAX) 0.25 MG tablet Take 2 tablets (0.5 mg total) by mouth at bedtime as needed for anxiety. 10 tablet 0   amLODipine (NORVASC) 5 MG tablet Take 5 mg by mouth daily.       cyclobenzaprine  (FLEXERIL) 5 MG tablet TAKE ONE TABLET 3 TIMES A DAY AS NEEDED. (Patient taking differently: Take 5 mg by mouth 3 (three) times daily as needed for muscle spasms. ) 30 tablet 0   dicyclomine (BENTYL) 20 MG tablet Take 20 mg by mouth 3 (three) times daily as needed for spasms.     diphenoxylate-atropine (LOMOTIL) 2.5-0.025 MG tablet TAKE 1 TABLET BY MOUTH 4 TIMES DAILY AS NEEDED FOR LOOSE STOOLS. (Patient taking differently: Take 1 tablet by mouth 4 (four) times daily as needed for diarrhea or loose stools. ) 30 tablet 0   estazolam (PROSOM) 2 MG tablet Take 2 mg by mouth at bedtime.     estradiol (ESTRACE) 2 MG tablet Take 2 mg by mouth daily.       FLUoxetine (PROZAC) 40 MG capsule Take 40 mg by mouth daily.   2   HYDROcodone-acetaminophen (NORCO/VICODIN) 5-325 MG tablet Take 1 tablet by mouth every 4 (four) hours as needed for moderate pain. 10 tablet 0   levothyroxine (SYNTHROID, LEVOTHROID) 50 MCG tablet Take 50 mcg by mouth daily before breakfast.      lidocaine-prilocaine (EMLA) cream Apply 1 application topically as needed. To numb skin over port a cath: Squeeze a  small amount on cotton ball and place over port site 1-2 hours prior to chemotherapy. 30 g 0   loperamide (IMODIUM) 2 MG capsule Take 2 mg by mouth every 2 (two) hours as needed for diarrhea or loose stools.     magnesium oxide (MAG-OX) 400 (241.3 Mg) MG tablet Take 1 tablet (400 mg total) by mouth 2 (two) times daily. 60 tablet 1   metoprolol succinate (TOPROL-XL) 25 MG 24 hr tablet Take 25 mg by mouth daily.     ondansetron (ZOFRAN-ODT) 4 MG disintegrating tablet Dissolve one tablet by mouth every 8 hours as needed for nausea and vomiting, 20 tablet 1   OXYGEN Inhale 2 L into the lungs daily as needed (for shortness of breath).     pantoprazole (PROTONIX) 40 MG tablet Take 40 mg by mouth 2 (two) times daily before a meal.   10   phenazopyridine (AZO-TABS) 95 MG tablet Take 190 mg by mouth 2 (two) times a day.      potassium chloride SA (K-DUR,KLOR-CON) 20 MEQ tablet Take 20 mEq by mouth daily as needed (for supplement).     promethazine (PHENERGAN) 25 MG tablet TAKE ONE TABLET BY MOUTH EVERY 6 HOURS AS NEEDED. (Patient taking differently: Take 25 mg by mouth every 6 (six) hours as needed for nausea or vomiting. ) 30 tablet 0   senna-docusate (SENOKOT-S) 8.6-50 MG tablet Take 1 tablet by mouth daily as needed for mild constipation or moderate constipation.      sucralfate (CARAFATE) 1 g tablet Take 1 tablet (1 g total) by mouth 4 (four) times daily. 120 tablet 0   No current facility-administered medications for this visit.    Facility-Administered Medications Ordered in Other Visits  Medication Dose Route Frequency Provider Last Rate Last Dose  0.9 %  sodium chloride infusion   Intravenous Continuous Curt Bears, MD   Stopped at 04/27/19 1210   sodium chloride flush (NS) 0.9 % injection 10 mL  10 mL Intracatheter PRN Curt Bears, MD   10 mL at 03/16/19 1001   sodium chloride flush (NS) 0.9 % injection 10 mL  10 mL Intracatheter PRN Curt Bears, MD   10 mL at 04/27/19 1212    SURGICAL HISTORY:  Past Surgical History:  Procedure Laterality Date   BIOPSY N/A 05/25/2013   Procedure: BIOPSIES (Random Colon; Duodenal; Gastric);  Surgeon: Danie Binder, MD;  Location: [facility];  Service: Endoscopy;  Laterality: N/A;   BLADDER SUSPENSION     BREAST ENHANCEMENT SURGERY     BREAST IMPLANT REMOVAL     CERVICAL FUSION  AUG 2013   CHOLECYSTECTOMY  1999   COLONOSCOPY  2007 [place]   POLYPS   COLONOSCOPY WITH PROPOFOL N/A 05/25/2013   Procedure: COLONOSCOPY WITH PROPOFOL(at cecum 0957) total withdrawal time=47min);  Surgeon: Danie Binder, MD;  Location: [facility];  Service: Endoscopy;  Laterality: N/A;   ESOPHAGOGASTRODUODENOSCOPY (EGD) WITH PROPOFOL N/A 05/25/2013   Procedure: ESOPHAGOGASTRODUODENOSCOPY (EGD) WITH PROPOFOL;  Surgeon: Danie Binder, MD;  Location: [facility];  Service:  Endoscopy;  Laterality: N/A;   FOOT SURGERY     IR FLUORO GUIDE PORT INSERTION RIGHT  03/04/2018   IR US GUIDE VASC ACCESS RIGHT  03/04/2018   POLYPECTOMY N/A 05/25/2013   Procedure: POLYPECTOMY (Rectal and Gastric);  Surgeon: Danie Binder, MD;  Location: [facility];  Service: Endoscopy;  Laterality: N/A;   TONSILLECTOMY     UPPER GASTROINTESTINAL ENDOSCOPY     VIDEO BRONCHOSCOPY Bilateral 05/10/2019   Procedure: VIDEO BRONCHOSCOPY WITHOUT FLUORO;  Surgeon: Chesley Mires, MD;  Location: [facility];  Service: Endoscopy;  Laterality: Bilateral;   VIDEO BRONCHOSCOPY WITH ENDOBRONCHIAL ULTRASOUND  09/12/2016   Procedure: VIDEO BRONCHOSCOPY WITH ENDOBRONCHIAL ULTRASOUND AND BIOPSY;  Surgeon: Juanito Doom, MD;  Location: [facility];  Service: Cardiopulmonary;;    REVIEW OF SYSTEMS:  Constitutional: positive for fatigue and weight loss Eyes: negative Ears, nose, mouth, throat, and face: negative Respiratory: positive for dyspnea on exertion Cardiovascular: negative Gastrointestinal: negative Genitourinary:negative Integument/breast: negative Hematologic/lymphatic: negative Musculoskeletal:positive for muscle weakness Neurological: negative Behavioral/Psych: negative Endocrine: negative Allergic/Immunologic: negative   PHYSICAL EXAMINATION: General appearance: alert, cooperative, fatigued and no distress Head: Normocephalic, without obvious abnormality, atraumatic Neck: no adenopathy, no JVD, supple, symmetrical, trachea midline and thyroid not enlarged, symmetric, no tenderness/mass/nodules Lymph nodes: Cervical, supraclavicular, and axillary nodes normal. Resp: clear to auscultation bilaterally Back: symmetric, no curvature. ROM normal. No CVA tenderness. Cardio: regular rate and rhythm, S1, S2 normal, no murmur, click, rub or gallop GI: soft, non-tender; bowel sounds normal; no masses,  no organomegaly Extremities: extremities normal, atraumatic, no cyanosis or edema Neurologic:  Alert and oriented X 3, normal strength and tone. Normal symmetric reflexes. Normal coordination and gait   ECOG PERFORMANCE STATUS: 1 - Symptomatic but completely ambulatory  Blood pressure 137/87, pulse 79, temperature 98.9 F (37.2 C), temperature source Oral, resp. rate 18, height 5\' 4"  (1.626 m), weight 138 lb 9.6 oz (62.9 kg), SpO2 100 %.  LABORATORY DATA: Lab Results  Component Value Date   WBC 6.1 05/24/2019   HGB 7.9 (L) 05/24/2019   HCT 24.1 (L) 05/24/2019   MCV 108.1 (H) 05/24/2019   PLT 156 05/24/2019      Chemistry      Component Value Date/Time   NA  141 05/24/2019 0739   NA 141 09/08/2017 1009   K 3.3 (L) 05/24/2019 0739   K 3.5 09/08/2017 1009   CL 105 05/24/2019 0739   CO2 26 05/24/2019 0739   CO2 29 09/08/2017 1009   BUN 15 05/24/2019 0739   BUN 5.8 (L) 09/08/2017 1009   CREATININE 1.22 (H) 05/24/2019 0739   CREATININE 0.7 09/08/2017 1009      Component Value Date/Time   CALCIUM 7.3 (L) 05/24/2019 0739   CALCIUM 9.0 09/08/2017 1009   ALKPHOS 50 05/24/2019 0739   ALKPHOS 49 09/08/2017 1009   AST 12 (L) 05/24/2019 0739   AST 9 09/08/2017 1009   ALT 9 05/24/2019 0739   ALT <6 09/08/2017 1009   BILITOT 0.4 05/24/2019 0739   BILITOT 0.51 09/08/2017 1009       RADIOGRAPHIC STUDIES: Ct Head W & Wo Contrast  Result Date: 05/10/2019 CLINICAL DATA:  Small cell lung cancer. Confusion and hallucinations recently. EXAM: CT HEAD WITHOUT AND WITH CONTRAST TECHNIQUE: Contiguous axial images were obtained from the base of the skull through the vertex without and with intravenous contrast CONTRAST:  74mL OMNIPAQUE IOHEXOL 300 MG/ML  SOLN COMPARISON:  MRI 02/09/2018 FINDINGS: Brain: The brainstem and cerebellum are unremarkable. There are mild chronic small-vessel ischemic changes of the cerebral hemispheric white matter. Old lacunar infarction in the right basal ganglia/internal capsule. No sign of recent infarction, mass lesion, hemorrhage, hydrocephalus or  extra-axial collection. No abnormal enhancement occurs. Vascular: There is atherosclerotic calcification of the major vessels at the base of the brain. Skull: Negative Sinuses/Orbits: Clear/normal Other: None IMPRESSION: No evidence of metastatic disease. No evidence of acute or subacute infarction by CT. Background pattern of chronic small vessel ischemic change. Old lacunar infarction right basal ganglia/internal capsule. Electronically Signed   By: Nelson Chimes M.D.   On: 05/10/2019 14:00   Ct Chest W Contrast  Result Date: 05/07/2019 CLINICAL DATA:  Lung cancer, UTI, chest pain, abdominal pain, vomiting EXAM: CT CHEST, ABDOMEN, AND PELVIS WITH CONTRAST TECHNIQUE: Multidetector CT imaging of the chest, abdomen and pelvis was performed following the standard protocol during bolus administration of intravenous contrast. CONTRAST:  137mL OMNIPAQUE IOHEXOL 300 MG/ML  SOLN COMPARISON:  Same day chest radiograph, CT chest abdomen pelvis, 04/09/2019 FINDINGS: CT CHEST FINDINGS Cardiovascular: No significant vascular findings. Normal heart size. No pericardial effusion. Right chest port catheter. Mediastinum/Nodes: Leftward shift of the mediastinum. Slight interval increase in size of a left hilar and mediastinal mass about the left mainstem bronchus, with increased mass effect on the left mainstem bronchus. Enlarged subcarinal lymph node and involvement of the midportion of the esophagus by mass. Normal thyroid. Small hiatal hernia. Lungs/Pleura: Near total atelectasis of the left lung secondary to left mainstem bronchial obstruction with a small pleural effusion. Slightly increased ground-glass opacity of the right upper lobe (series 4, image 50). Mild emphysema. Musculoskeletal: No chest wall mass. CT ABDOMEN PELVIS FINDINGS Hepatobiliary: No focal liver abnormality is seen. Status post cholecystectomy with postoperative biliary ductal dilatation. Pancreas: Unremarkable. No pancreatic ductal dilatation or  surrounding inflammatory changes. Spleen: Normal in size without significant abnormality. Adrenals/Urinary Tract: Adrenal glands are unremarkable. Kidneys are normal, without renal calculi, solid lesion, or hydronephrosis. Bladder is unremarkable. Stomach/Bowel: Stomach is within normal limits. Appendix appears normal. No evidence of bowel wall thickening, distention, or inflammatory changes. Sigmoid diverticulosis. Vascular/Lymphatic: Calcific atherosclerosis. No enlarged abdominal or pelvic lymph nodes. Reproductive: Status post hysterectomy. Other: No abdominal wall hernia or abnormality. No abdominopelvic ascites. Musculoskeletal: Unchanged  sclerotic lesion of the L3 vertebral body. IMPRESSION: 1. Slight interval increase in size of a left hilar and mediastinal mass about the left mainstem bronchus, with increased mass effect on the left mainstem bronchus. Near total atelectasis of the left lung secondary to left mainstem bronchial obstruction with a small pleural effusion. 2. Slightly increased infectious or inflammatory ground-glass opacity of the right upper lobe (series 4, image 50). Mild emphysema. 3.  Unchanged sclerotic lesion of the L3 vertebral body. 4. Other chronic, incidental, and postoperative findings as detailed above. Electronically Signed   By: Eddie Candle M.D.   On: 05/07/2019 19:53   Ct Abdomen Pelvis W Contrast  Result Date: 05/07/2019 CLINICAL DATA:  Lung cancer, UTI, chest pain, abdominal pain, vomiting EXAM: CT CHEST, ABDOMEN, AND PELVIS WITH CONTRAST TECHNIQUE: Multidetector CT imaging of the chest, abdomen and pelvis was performed following the standard protocol during bolus administration of intravenous contrast. CONTRAST:  117mL OMNIPAQUE IOHEXOL 300 MG/ML  SOLN COMPARISON:  Same day chest radiograph, CT chest abdomen pelvis, 04/09/2019 FINDINGS: CT CHEST FINDINGS Cardiovascular: No significant vascular findings. Normal heart size. No pericardial effusion. Right chest port  catheter. Mediastinum/Nodes: Leftward shift of the mediastinum. Slight interval increase in size of a left hilar and mediastinal mass about the left mainstem bronchus, with increased mass effect on the left mainstem bronchus. Enlarged subcarinal lymph node and involvement of the midportion of the esophagus by mass. Normal thyroid. Small hiatal hernia. Lungs/Pleura: Near total atelectasis of the left lung secondary to left mainstem bronchial obstruction with a small pleural effusion. Slightly increased ground-glass opacity of the right upper lobe (series 4, image 50). Mild emphysema. Musculoskeletal: No chest wall mass. CT ABDOMEN PELVIS FINDINGS Hepatobiliary: No focal liver abnormality is seen. Status post cholecystectomy with postoperative biliary ductal dilatation. Pancreas: Unremarkable. No pancreatic ductal dilatation or surrounding inflammatory changes. Spleen: Normal in size without significant abnormality. Adrenals/Urinary Tract: Adrenal glands are unremarkable. Kidneys are normal, without renal calculi, solid lesion, or hydronephrosis. Bladder is unremarkable. Stomach/Bowel: Stomach is within normal limits. Appendix appears normal. No evidence of bowel wall thickening, distention, or inflammatory changes. Sigmoid diverticulosis. Vascular/Lymphatic: Calcific atherosclerosis. No enlarged abdominal or pelvic lymph nodes. Reproductive: Status post hysterectomy. Other: No abdominal wall hernia or abnormality. No abdominopelvic ascites. Musculoskeletal: Unchanged sclerotic lesion of the L3 vertebral body. IMPRESSION: 1. Slight interval increase in size of a left hilar and mediastinal mass about the left mainstem bronchus, with increased mass effect on the left mainstem bronchus. Near total atelectasis of the left lung secondary to left mainstem bronchial obstruction with a small pleural effusion. 2. Slightly increased infectious or inflammatory ground-glass opacity of the right upper lobe (series 4, image 50).  Mild emphysema. 3.  Unchanged sclerotic lesion of the L3 vertebral body. 4. Other chronic, incidental, and postoperative findings as detailed above. Electronically Signed   By: Eddie Candle M.D.   On: 05/07/2019 19:53   Dg Chest Port 1 View  Result Date: 05/07/2019 CLINICAL DATA:  UTI, lung cancer, emesis EXAM: PORTABLE CHEST 1 VIEW COMPARISON:  04/03/2019 FINDINGS: There is total opacification of the left hemithorax. The cardiac and mediastinal borders are obscured. Right chest port catheter. IMPRESSION: There is total opacification of the left hemithorax, concerning for large left pleural effusion and/or left mainstem bronchial obstruction by hilar mass. Recommend CT to further evaluate. Electronically Signed   By: Eddie Candle M.D.   On: 05/07/2019 17:31    ASSESSMENT AND PLAN:  This is a very pleasant 63 years old  white female was limited stage small cell lung cancer, status post 6 cycles of systemic chemotherapy with carboplatin and etoposide concurrent with radiation and followed by prophylactic cranial irradiation. Patient has been in observation for more than a year.  She has been doing fine but recently started complaining of increasing fatigue and weakness as well as headache. The patient had evidence for disease recurrence and she was a started on treatment with systemic chemotherapy with carboplatin, etoposide and Tecentriq.  She is status post 6 cycles.  The patient had no evidence for disease progression after the induction phase of her treatment.  She was started on maintenance treatment with Tecentriq Huey Bienenstock) status post 6 cycles.  She has been tolerating this treatment well.   She had evidence for disease progression and the patient was started on systemic chemotherapy with cisplatin and irinotecan on days 1 and 8.  She is tolerating her treatment much better after her dose was reduced to cisplatin 25 mg/M2 and irinotecan 50 mg/M2 on days 1 and 8.  She is status post 6 cycles. The  patient continues to tolerate her treatment well but she was recently admitted to the hospital with urinary tract infection as well as postobstructive pneumonia.  She completed a course of palliative radiotherapy to obstructive right lung lesion under the care of Dr. Sondra Come. She was supposed to start cycle #1 of her chemotherapy today but the patient is still complaining of increasing fatigue and weakness. She had significant electrolyte abnormalities including severe hypomagnesemia, and hypokalemia. I recommended for the patient to delay the start of cycle number 7 by 1 week until improvement of her condition. For the hypomagnesemia, I will arrange for the patient to receive 6 g of magnesium sulfate intravenously today.  She will also receive potassium supplement IV today. For the chemotherapy-induced anemia, I will arrange for the patient to receive 1 unit of PRBCs transfusion today. I will see her back for follow-up visit in 4 weeks with the start of cycle #8. She was advised to call immediately if she has any concerning symptoms in the interval. The patient voices understanding of current disease status and treatment options and is in agreement with the current care plan. All questions were answered. The patient knows to call the clinic with any problems, questions or concerns. We can certainly see the patient much sooner if necessary.   Disclaimer: This note was dictated with voice recognition software. Similar sounding words can inadvertently be transcribed and may not be corrected upon review.

## 2019-05-25 LAB — TYPE AND SCREEN
ABO/RH(D): O POS
Antibody Screen: NEGATIVE
Unit division: 0

## 2019-05-25 LAB — BPAM RBC
Blood Product Expiration Date: 202007192359
ISSUE DATE / TIME: 202006221400
Unit Type and Rh: 5100

## 2019-05-27 ENCOUNTER — Telehealth: Payer: Self-pay | Admitting: Internal Medicine

## 2019-05-27 NOTE — Telephone Encounter (Signed)
Scheduled appt per 6/23 los - unable to reach pt - left message with appt date and time

## 2019-05-31 ENCOUNTER — Telehealth: Payer: Self-pay | Admitting: *Deleted

## 2019-05-31 ENCOUNTER — Other Ambulatory Visit: Payer: Medicare Other

## 2019-05-31 ENCOUNTER — Other Ambulatory Visit: Payer: No Typology Code available for payment source

## 2019-05-31 ENCOUNTER — Ambulatory Visit: Payer: Medicare Other

## 2019-05-31 NOTE — Telephone Encounter (Signed)
Rebekah Henderson, [place] called for pt regarding pt appt today, future appts and treatment schedule in general. Pt currently resides at [facility]. S/w Janett Billow CSW at [facility], admitted on 6/15.  Request [facility] draw labs on pt today and fax results to office.  Pt missed appt today, per [facility] did not have any knowledge of any appts for pt.

## 2019-06-02 ENCOUNTER — Telehealth: Payer: Self-pay | Admitting: *Deleted

## 2019-06-02 ENCOUNTER — Other Ambulatory Visit: Payer: Self-pay | Admitting: *Deleted

## 2019-06-02 DIAGNOSIS — D649 Anemia, unspecified: Secondary | ICD-10-CM

## 2019-06-02 NOTE — Telephone Encounter (Signed)
Call to [facility], lm w/front office for a call back regarding pt lab work/results.

## 2019-06-02 NOTE — Addendum Note (Signed)
Addended by: Lucile Crater on: 06/02/2019 03:07 PM   Modules accepted: Miquel Dunn

## 2019-06-02 NOTE — Telephone Encounter (Signed)
Lab results received from [facility]. Reviewed by MD. Message to scheduling for blood transfusion 7/6 with Treatment.

## 2019-06-02 NOTE — Telephone Encounter (Signed)
Call to [facility], call transferred, rings then disconnects. Called back lm with [place] reception who took message and advised he would take to RN caring for Mrs Wendt. Request for Lab results to be faxed to MD office. Spoke with Karilyn Cota who advised pt spoke with her and she (pt)had a UTI and the facility has not drawn her labs. [name] advised pt was to see PA and possibly start on Antibiotic for UTI. She is working on getting pt back home as she feels [employer] is not doing a good job. Discussed with [name] I am waiting on a call back from [facility] as well regarding lab work.

## 2019-06-07 ENCOUNTER — Inpatient Hospital Stay: Payer: Medicare Other

## 2019-06-07 ENCOUNTER — Inpatient Hospital Stay (HOSPITAL_BASED_OUTPATIENT_CLINIC_OR_DEPARTMENT_OTHER): Payer: Medicare Other | Admitting: Internal Medicine

## 2019-06-07 ENCOUNTER — Encounter: Payer: Self-pay | Admitting: Internal Medicine

## 2019-06-07 ENCOUNTER — Inpatient Hospital Stay: Payer: Medicare Other | Attending: Internal Medicine

## 2019-06-07 ENCOUNTER — Other Ambulatory Visit: Payer: Self-pay | Admitting: Medical Oncology

## 2019-06-07 ENCOUNTER — Ambulatory Visit: Payer: Medicare Other | Admitting: Internal Medicine

## 2019-06-07 ENCOUNTER — Other Ambulatory Visit: Payer: Self-pay

## 2019-06-07 ENCOUNTER — Other Ambulatory Visit: Payer: Self-pay | Admitting: Internal Medicine

## 2019-06-07 VITALS — BP 119/78 | HR 81 | Temp 98.0°F | Resp 17 | Ht 64.0 in | Wt 139.6 lb

## 2019-06-07 DIAGNOSIS — C3492 Malignant neoplasm of unspecified part of left bronchus or lung: Secondary | ICD-10-CM

## 2019-06-07 DIAGNOSIS — Z5111 Encounter for antineoplastic chemotherapy: Secondary | ICD-10-CM | POA: Insufficient documentation

## 2019-06-07 DIAGNOSIS — C7951 Secondary malignant neoplasm of bone: Secondary | ICD-10-CM | POA: Diagnosis not present

## 2019-06-07 DIAGNOSIS — I10 Essential (primary) hypertension: Secondary | ICD-10-CM | POA: Diagnosis not present

## 2019-06-07 DIAGNOSIS — N390 Urinary tract infection, site not specified: Secondary | ICD-10-CM | POA: Diagnosis not present

## 2019-06-07 DIAGNOSIS — R300 Dysuria: Secondary | ICD-10-CM

## 2019-06-07 DIAGNOSIS — C771 Secondary and unspecified malignant neoplasm of intrathoracic lymph nodes: Secondary | ICD-10-CM | POA: Insufficient documentation

## 2019-06-07 DIAGNOSIS — C3412 Malignant neoplasm of upper lobe, left bronchus or lung: Secondary | ICD-10-CM

## 2019-06-07 DIAGNOSIS — D6481 Anemia due to antineoplastic chemotherapy: Secondary | ICD-10-CM

## 2019-06-07 DIAGNOSIS — R112 Nausea with vomiting, unspecified: Secondary | ICD-10-CM

## 2019-06-07 DIAGNOSIS — Z79899 Other long term (current) drug therapy: Secondary | ICD-10-CM | POA: Insufficient documentation

## 2019-06-07 DIAGNOSIS — B9629 Other Escherichia coli [E. coli] as the cause of diseases classified elsewhere: Secondary | ICD-10-CM

## 2019-06-07 DIAGNOSIS — J449 Chronic obstructive pulmonary disease, unspecified: Secondary | ICD-10-CM | POA: Insufficient documentation

## 2019-06-07 DIAGNOSIS — C787 Secondary malignant neoplasm of liver and intrahepatic bile duct: Secondary | ICD-10-CM | POA: Insufficient documentation

## 2019-06-07 DIAGNOSIS — E039 Hypothyroidism, unspecified: Secondary | ICD-10-CM

## 2019-06-07 DIAGNOSIS — E860 Dehydration: Secondary | ICD-10-CM

## 2019-06-07 DIAGNOSIS — C3432 Malignant neoplasm of lower lobe, left bronchus or lung: Secondary | ICD-10-CM

## 2019-06-07 DIAGNOSIS — D649 Anemia, unspecified: Secondary | ICD-10-CM

## 2019-06-07 LAB — CMP (CANCER CENTER ONLY)
ALT: 6 U/L (ref 0–44)
AST: 10 U/L — ABNORMAL LOW (ref 15–41)
Albumin: 2.9 g/dL — ABNORMAL LOW (ref 3.5–5.0)
Alkaline Phosphatase: 50 U/L (ref 38–126)
Anion gap: 11 (ref 5–15)
BUN: 11 mg/dL (ref 8–23)
CO2: 24 mmol/L (ref 22–32)
Calcium: 8.2 mg/dL — ABNORMAL LOW (ref 8.9–10.3)
Chloride: 106 mmol/L (ref 98–111)
Creatinine: 0.81 mg/dL (ref 0.44–1.00)
GFR, Est AFR Am: >60 mL/min (ref >60)
GFR, Estimated: >60 mL/min (ref >60)
Glucose, Bld: 98 mg/dL (ref 70–99)
Potassium: 3.5 mmol/L (ref 3.5–5.1)
Sodium: 141 mmol/L (ref 135–145)
Total Bilirubin: 0.3 mg/dL (ref 0.3–1.2)
Total Protein: 6.3 g/dL — ABNORMAL LOW (ref 6.5–8.1)

## 2019-06-07 LAB — CBC WITH DIFFERENTIAL (CANCER CENTER ONLY)
Abs Immature Granulocytes: 0.05 10*3/uL (ref 0.00–0.07)
Basophils Absolute: 0 10*3/uL (ref 0.0–0.1)
Basophils Relative: 0 %
Eosinophils Absolute: 0.2 10*3/uL (ref 0.0–0.5)
Eosinophils Relative: 4 %
HCT: 26.3 % — ABNORMAL LOW (ref 36.0–46.0)
Hemoglobin: 8.7 g/dL — ABNORMAL LOW (ref 12.0–15.0)
Immature Granulocytes: 1 %
Lymphocytes Relative: 19 %
Lymphs Abs: 1 10*3/uL (ref 0.7–4.0)
MCH: 33.6 pg (ref 26.0–34.0)
MCHC: 33.1 g/dL (ref 30.0–36.0)
MCV: 101.5 fL — ABNORMAL HIGH (ref 80.0–100.0)
Monocytes Absolute: 0.5 10*3/uL (ref 0.1–1.0)
Monocytes Relative: 9 %
Neutro Abs: 3.6 10*3/uL (ref 1.7–7.7)
Neutrophils Relative %: 67 %
Platelet Count: 153 10*3/uL (ref 150–400)
RBC: 2.59 MIL/uL — ABNORMAL LOW (ref 3.87–5.11)
RDW: 14.1 % (ref 11.5–15.5)
WBC Count: 5.4 10*3/uL (ref 4.0–10.5)
nRBC: 0 % (ref 0.0–0.2)

## 2019-06-07 LAB — URINALYSIS, COMPLETE (UACMP) WITH MICROSCOPIC
Bilirubin Urine: NEGATIVE
Glucose, UA: NEGATIVE mg/dL
Hgb urine dipstick: NEGATIVE
Ketones, ur: NEGATIVE mg/dL
Nitrite: NEGATIVE
Protein, ur: NEGATIVE mg/dL
Specific Gravity, Urine: 1.009 (ref 1.005–1.030)
pH: 5 (ref 5.0–8.0)

## 2019-06-07 LAB — MAGNESIUM: Magnesium: 1.3 mg/dL — CL (ref 1.7–2.4)

## 2019-06-07 LAB — PREPARE RBC (CROSSMATCH)

## 2019-06-07 MED ORDER — SODIUM CHLORIDE 0.9 % IV SOLN
Freq: Once | INTRAVENOUS | Status: AC
  Administered 2019-06-07: 13:00:00 via INTRAVENOUS
  Filled 2019-06-07: qty 5

## 2019-06-07 MED ORDER — HEPARIN SOD (PORK) LOCK FLUSH 100 UNIT/ML IV SOLN
500.0000 [IU] | Freq: Once | INTRAVENOUS | Status: AC | PRN
  Administered 2019-06-07: 500 [IU]
  Filled 2019-06-07: qty 5

## 2019-06-07 MED ORDER — SODIUM CHLORIDE 0.9% FLUSH
10.0000 mL | INTRAVENOUS | Status: DC | PRN
  Administered 2019-06-07: 10 mL
  Filled 2019-06-07: qty 10

## 2019-06-07 MED ORDER — SODIUM CHLORIDE 0.9 % IV SOLN
Freq: Once | INTRAVENOUS | Status: AC
  Administered 2019-06-07: 09:00:00 via INTRAVENOUS
  Filled 2019-06-07: qty 250

## 2019-06-07 MED ORDER — POTASSIUM CHLORIDE 2 MEQ/ML IV SOLN
Freq: Once | INTRAVENOUS | Status: AC
  Administered 2019-06-07: 10:00:00 via INTRAVENOUS
  Filled 2019-06-07: qty 10

## 2019-06-07 MED ORDER — SODIUM CHLORIDE 0.9 % IV SOLN
25.0000 mg/m2 | Freq: Once | INTRAVENOUS | Status: AC
  Administered 2019-06-07: 45 mg via INTRAVENOUS
  Filled 2019-06-07: qty 45

## 2019-06-07 MED ORDER — ATROPINE SULFATE 1 MG/ML IJ SOLN
INTRAMUSCULAR | Status: AC
  Filled 2019-06-07: qty 1

## 2019-06-07 MED ORDER — PALONOSETRON HCL INJECTION 0.25 MG/5ML
INTRAVENOUS | Status: AC
  Filled 2019-06-07: qty 5

## 2019-06-07 MED ORDER — PALONOSETRON HCL INJECTION 0.25 MG/5ML
0.2500 mg | Freq: Once | INTRAVENOUS | Status: AC
  Administered 2019-06-07: 13:00:00 0.25 mg via INTRAVENOUS

## 2019-06-07 MED ORDER — NITROFURANTOIN MONOHYD MACRO 100 MG PO CAPS: 100.0000 mg | ORAL_CAPSULE | Freq: Two times a day (BID) | ORAL | 0 refills | Status: DC

## 2019-06-07 MED ORDER — ATROPINE SULFATE 1 MG/ML IJ SOLN
0.5000 mg | Freq: Once | INTRAMUSCULAR | Status: AC | PRN
  Administered 2019-06-07: 14:00:00 0.5 mg via INTRAVENOUS

## 2019-06-07 MED ORDER — IRINOTECAN HCL CHEMO INJECTION 100 MG/5ML
50.0000 mg/m2 | Freq: Once | INTRAVENOUS | Status: AC
  Administered 2019-06-07: 80 mg via INTRAVENOUS
  Filled 2019-06-07: qty 4

## 2019-06-07 NOTE — Progress Notes (Signed)
[employer] [phone]   [phone]  OFFICE PROGRESS NOTE  Redmond School, MD [address] [place] [address]  DIAGNOSIS: Metastatic small cell lung cancer initially diagnosed as Limited stage (T1b, N2, M0) small cell lung cancer presented with left lower lobe/infrahilar mass and large mediastinal lymphadenopathy diagnosed in October 2017.  PRIOR THERAPY: 1) Systemic chemotherapy with cisplatin 60 MG/M2 on day 1 and etoposide 120 MG/M2 on days 1, 2 and 3 status post 1 cycle. This was discontinued secondary to intolerance. 2) Systemic chemotherapy with carboplatin for AUC of 4 on day 1 and etoposide 100 MG/M2 on days 1, 2 and 3 with Neulasta support on day 4 every 3 weeks. Status post 5 cycles. This was concurrent with radiation in [place], [place]. 3) prophylactic cranial irradiation. 4) Systemic chemotherapy with carboplatin for AUC of 5 on day 1, etoposide 100 mg/M2 on days 1, 2 and 3 as well as a Tecentriq (Atezolizumab) 1200 mg IV every 3 weeks with Neulasta support.  First dose February 17, 2018.  Status post 6 cycles of partial response. 5) Maintenance treatment with immunotherapy with Tecentriq 1200 mg IV every 3 weeks.  First dose June 30, 2018.  Status post 6 cycles.  Last dose was given October 13, 2018 discontinued secondary to disease progression.   CURRENT THERAPY: Systemic chemotherapy with cisplatin 30 mg/M2 and irinotecan 65 mg/M2 on days 1 and 8 every 3 weeks.  First dose November 10, 2018.  Starting cycle #2 her dose of cisplatin was reduced to 25 mg/M2 and irinotecan 50 mg/M2 on days 1 and 8 every 3 weeks.  Status post 6 cycles.   INTERVAL HISTORY: Rebekah Henderson 63 y.o. female returns to the clinic today for follow-up visit.  The patient is feeling much better today except for persistent dysuria.  She was recently treated with Cipro for urinary tract infection last dose was last night.  She denied having any nausea,  vomiting, diarrhea or constipation.  She has intermittent right-sided chest pain but no significant shortness of breath, cough or hemoptysis.  She has no fever or chills.  She is currently home after discharge from skilled nursing facility.  She is here today for evaluation and resuming her systemic chemotherapy that has been delayed for several weeks  MEDICAL HISTORY: Past Medical History:  Diagnosis Date  . 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 Anxiety    takes Prozac daily  . Arthritis   . Benign fundic gland polyps of stomach   . Colon polyps   . COPD (chronic obstructive pulmonary disease) ([place])   . Dehydration 03/06/2017  . Diverticulitis   . Dyspnea    with exertion  . Encounter for antineoplastic chemotherapy 12/03/2016  . Fibromyalgia   . GERD (gastroesophageal reflux disease)    takes Pantoprazole daily  . Hypertension    takes Metoprolol,Triamterene-HCTZ,and Amlodipine daily  . Hypothyroidism    takes Synthroid daily  . IBS (irritable bowel syndrome)   . lung ca dx'd 10/02/2016   skin, lung  . PONV (postoperative nausea and vomiting)    pt also states that she had some difficulty breathing after cervical fusion    ALLERGIES:  is allergic to penicillins; codeine; fentanyl; ranitidine hcl; keflex [cephalexin]; and lyrica [pregabalin].  MEDICATIONS:  Current Outpatient Medications  Medication Sig Dispense Refill  . albuterol (PROVENTIL HFA;VENTOLIN HFA) 108 (90 Base) MCG/ACT inhaler Inhale 2 puffs into the lungs every 4 (four) hours as needed for wheezing  or shortness of breath.    . ALPRAZolam (XANAX) 0.25 MG tablet Take 2 tablets (0.5 mg total) by mouth at bedtime as needed for anxiety. 10 tablet 0  . amLODipine (NORVASC) 5 MG tablet Take 5 mg by mouth daily.      . cyclobenzaprine (FLEXERIL) 5 MG tablet TAKE ONE TABLET 3 TIMES A DAY AS NEEDED. (Patient taking differently: Take 5 mg by mouth 3 (three) times daily as needed for muscle spasms. ) 30 tablet 0   . dicyclomine (BENTYL) 20 MG tablet Take 20 mg by mouth 3 (three) times daily as needed for spasms.    . diphenoxylate-atropine (LOMOTIL) 2.5-0.025 MG tablet TAKE 1 TABLET BY MOUTH 4 TIMES DAILY AS NEEDED FOR LOOSE STOOLS. (Patient taking differently: Take 1 tablet by mouth 4 (four) times daily as needed for diarrhea or loose stools. ) 30 tablet 0  . estazolam (PROSOM) 2 MG tablet Take 2 mg by mouth at bedtime.    Marland Kitchen estradiol (ESTRACE) 2 MG tablet Take 2 mg by mouth daily.      Marland Kitchen FLUoxetine (PROZAC) 40 MG capsule Take 40 mg by mouth daily.   2  . HYDROcodone-acetaminophen (NORCO/VICODIN) 5-325 MG tablet Take 1 tablet by mouth every 4 (four) hours as needed for moderate pain. 10 tablet 0  . levothyroxine (SYNTHROID, LEVOTHROID) 50 MCG tablet Take 50 mcg by mouth daily before breakfast.     . lidocaine-prilocaine (EMLA) cream Apply 1 application topically as needed. To numb skin over port a cath: Squeeze a  small amount on cotton ball and place over port site 1-2 hours prior to chemotherapy. 30 g 0  . loperamide (IMODIUM) 2 MG capsule Take 2 mg by mouth every 2 (two) hours as needed for diarrhea or loose stools.    . magnesium oxide (MAG-OX) 400 (241.3 Mg) MG tablet Take 1 tablet (400 mg total) by mouth 2 (two) times daily. 60 tablet 1  . metoprolol succinate (TOPROL-XL) 25 MG 24 hr tablet Take 25 mg by mouth daily.    . ondansetron (ZOFRAN-ODT) 4 MG disintegrating tablet Dissolve one tablet by mouth every 8 hours as needed for nausea and vomiting, 20 tablet 1  . OXYGEN Inhale 2 L into the lungs daily as needed (for shortness of breath).    . pantoprazole (PROTONIX) 40 MG tablet Take 40 mg by mouth 2 (two) times daily before a meal.   10  . phenazopyridine (AZO-TABS) 95 MG tablet Take 190 mg by mouth 2 (two) times a day.    . potassium chloride SA (K-DUR,KLOR-CON) 20 MEQ tablet Take 20 mEq by mouth daily as needed (for supplement).    . promethazine (PHENERGAN) 25 MG tablet TAKE ONE TABLET BY MOUTH  EVERY 6 HOURS AS NEEDED. (Patient taking differently: Take 25 mg by mouth every 6 (six) hours as needed for nausea or vomiting. ) 30 tablet 0  . senna-docusate (SENOKOT-S) 8.6-50 MG tablet Take 1 tablet by mouth daily as needed for mild constipation or moderate constipation.     . sucralfate (CARAFATE) 1 g tablet Take 1 tablet (1 g total) by mouth 4 (four) times daily. 120 tablet 0   No current facility-administered medications for this visit.    Facility-Administered Medications Ordered in Other Visits  Medication Dose Route Frequency Provider Last Rate Last Dose  . 0.9 %  sodium chloride infusion   Intravenous Continuous Curt Bears, MD   Stopped at 04/27/19 1210  . sodium chloride flush (NS) 0.9 % injection 10 mL  10 mL Intracatheter PRN Curt Bears, MD   10 mL at 03/16/19 1001  . sodium chloride flush (NS) 0.9 % injection 10 mL  10 mL Intracatheter PRN Curt Bears, MD   10 mL at 04/27/19 1212    SURGICAL HISTORY:  Past Surgical History:  Procedure Laterality Date  . BIOPSY N/A 05/25/2013   Procedure: BIOPSIES (Random Colon; Duodenal; Gastric);  Surgeon: Danie Binder, MD;  Location: [facility];  Service: Endoscopy;  Laterality: N/A;  . BLADDER SUSPENSION    . BREAST ENHANCEMENT SURGERY    . BREAST IMPLANT REMOVAL    . CERVICAL FUSION  AUG 2013  . CHOLECYSTECTOMY  1999  . COLONOSCOPY  2007 [place]   POLYPS  . COLONOSCOPY WITH PROPOFOL N/A 05/25/2013   Procedure: COLONOSCOPY WITH PROPOFOL(at cecum 0957) total withdrawal time=73min);  Surgeon: Danie Binder, MD;  Location: [facility];  Service: Endoscopy;  Laterality: N/A;  . ESOPHAGOGASTRODUODENOSCOPY (EGD) WITH PROPOFOL N/A 05/25/2013   Procedure: ESOPHAGOGASTRODUODENOSCOPY (EGD) WITH PROPOFOL;  Surgeon: Danie Binder, MD;  Location: [facility];  Service: Endoscopy;  Laterality: N/A;  . FOOT SURGERY    . IR FLUORO GUIDE PORT INSERTION RIGHT  03/04/2018  . IR US GUIDE VASC ACCESS RIGHT  03/04/2018  . POLYPECTOMY N/A 05/25/2013    Procedure: POLYPECTOMY (Rectal and Gastric);  Surgeon: Danie Binder, MD;  Location: [facility];  Service: Endoscopy;  Laterality: N/A;  . TONSILLECTOMY    . UPPER GASTROINTESTINAL ENDOSCOPY    . VIDEO BRONCHOSCOPY Bilateral 05/10/2019   Procedure: VIDEO BRONCHOSCOPY WITHOUT FLUORO;  Surgeon: Chesley Mires, MD;  Location: [facility];  Service: Endoscopy;  Laterality: Bilateral;  . VIDEO BRONCHOSCOPY WITH ENDOBRONCHIAL ULTRASOUND  09/12/2016   Procedure: VIDEO BRONCHOSCOPY WITH ENDOBRONCHIAL ULTRASOUND AND BIOPSY;  Surgeon: Juanito Doom, MD;  Location: [facility];  Service: Cardiopulmonary;;    REVIEW OF SYSTEMS:  Constitutional: positive for fatigue Eyes: negative Ears, nose, mouth, throat, and face: negative Respiratory: positive for pleurisy/chest pain Cardiovascular: negative Gastrointestinal: negative Genitourinary:positive for dysuria Integument/breast: negative Hematologic/lymphatic: negative Musculoskeletal:positive for muscle weakness Neurological: negative Behavioral/Psych: negative Endocrine: negative Allergic/Immunologic: negative   PHYSICAL EXAMINATION: General appearance: alert, cooperative, fatigued and no distress Head: Normocephalic, without obvious abnormality, atraumatic Neck: no adenopathy, no JVD, supple, symmetrical, trachea midline and thyroid not enlarged, symmetric, no tenderness/mass/nodules Lymph nodes: Cervical, supraclavicular, and axillary nodes normal. Resp: clear to auscultation bilaterally Back: symmetric, no curvature. ROM normal. No CVA tenderness. Cardio: regular rate and rhythm, S1, S2 normal, no murmur, click, rub or gallop GI: soft, non-tender; bowel sounds normal; no masses,  no organomegaly Extremities: extremities normal, atraumatic, no cyanosis or edema Neurologic: Alert and oriented X 3, normal strength and tone. Normal symmetric reflexes. Normal coordination and gait   ECOG PERFORMANCE STATUS: 1 - Symptomatic but completely ambulatory   Blood pressure 119/78, pulse 81, temperature 98 F (36.7 C), temperature source Oral, resp. rate 17, height 5\' 4"  (1.626 m), weight 139 lb 9.6 oz (63.3 kg), SpO2 100 %.  LABORATORY DATA: Lab Results  Component Value Date   WBC 5.4 06/07/2019   HGB 8.7 (L) 06/07/2019   HCT 26.3 (L) 06/07/2019   MCV 101.5 (H) 06/07/2019   PLT 153 06/07/2019      Chemistry      Component Value Date/Time   NA 141 05/24/2019 0739   NA 141 09/08/2017 1009   K 3.3 (L) 05/24/2019 0739   K 3.5 09/08/2017 1009   CL 105 05/24/2019 0739   CO2 26 05/24/2019  0739   CO2 29 09/08/2017 1009   BUN 15 05/24/2019 0739   BUN 5.8 (L) 09/08/2017 1009   CREATININE 1.22 (H) 05/24/2019 0739   CREATININE 0.7 09/08/2017 1009      Component Value Date/Time   CALCIUM 7.3 (L) 05/24/2019 0739   CALCIUM 9.0 09/08/2017 1009   ALKPHOS 50 05/24/2019 0739   ALKPHOS 49 09/08/2017 1009   AST 12 (L) 05/24/2019 0739   AST 9 09/08/2017 1009   ALT 9 05/24/2019 0739   ALT <6 09/08/2017 1009   BILITOT 0.4 05/24/2019 0739   BILITOT 0.51 09/08/2017 1009       RADIOGRAPHIC STUDIES: Ct Head W & Wo Contrast  Result Date: 05/10/2019 CLINICAL DATA:  Small cell lung cancer. Confusion and hallucinations recently. EXAM: CT HEAD WITHOUT AND WITH CONTRAST TECHNIQUE: Contiguous axial images were obtained from the base of the skull through the vertex without and with intravenous contrast CONTRAST:  20mL OMNIPAQUE IOHEXOL 300 MG/ML  SOLN COMPARISON:  MRI 02/09/2018 FINDINGS: Brain: The brainstem and cerebellum are unremarkable. There are mild chronic small-vessel ischemic changes of the cerebral hemispheric white matter. Old lacunar infarction in the right basal ganglia/internal capsule. No sign of recent infarction, mass lesion, hemorrhage, hydrocephalus or extra-axial collection. No abnormal enhancement occurs. Vascular: There is atherosclerotic calcification of the major vessels at the base of the brain. Skull: Negative Sinuses/Orbits:  Clear/normal Other: None IMPRESSION: No evidence of metastatic disease. No evidence of acute or subacute infarction by CT. Background pattern of chronic small vessel ischemic change. Old lacunar infarction right basal ganglia/internal capsule. Electronically Signed   By: Nelson Chimes M.D.   On: 05/10/2019 14:00    ASSESSMENT AND PLAN:  This is a very pleasant 63 years old white female was limited stage small cell lung cancer, status post 6 cycles of systemic chemotherapy with carboplatin and etoposide concurrent with radiation and followed by prophylactic cranial irradiation. Patient has been in observation for more than a year.  She has been doing fine but recently started complaining of increasing fatigue and weakness as well as headache. The patient had evidence for disease recurrence and she was a started on treatment with systemic chemotherapy with carboplatin, etoposide and Tecentriq.  She is status post 6 cycles.  The patient had no evidence for disease progression after the induction phase of her treatment.  She was started on maintenance treatment with Tecentriq Huey Bienenstock) status post 6 cycles.  She has been tolerating this treatment well.   She had evidence for disease progression and the patient was started on systemic chemotherapy with cisplatin and irinotecan on days 1 and 8.  She is tolerating her treatment much better after her dose was reduced to cisplatin 25 mg/M2 and irinotecan 50 mg/M2 on days 1 and 8.  She is status post 6 cycles. She has been off treatment for the last few weeks because of recent hospitalization as well as treatment with palliative radiotherapy to postobstructive pneumonia.  She also had pancytopenia delaying her treatment. She is feeling much better today and I recommended for her to resume her treatment with systemic chemotherapy with reduced dose cisplatin and irinotecan. For the dysuria, we will check her urinalysis today and consider the patient for another  course of antibiotics if needed. For the electrolyte abnormalities, we will continue to replace her electrolytes as needed. For the chemotherapy-induced anemia, we will consider the patient for transfusion if hemoglobin is less than 8.0G/DL I will see her back for follow-up visit in 3 weeks  for evaluation before the next cycle of her treatment. The patient was advised to call immediately if she has any concerning symptoms in the interval. The patient voices understanding of current disease status and treatment options and is in agreement with the current care plan. All questions were answered. The patient knows to call the clinic with any problems, questions or concerns. We can certainly see the patient much sooner if necessary.   Disclaimer: This note was dictated with voice recognition software. Similar sounding words can inadvertently be transcribed and may not be corrected upon review.

## 2019-06-07 NOTE — Progress Notes (Signed)
Per Dr. Julien Nordmann, increase magnesium in cisplatin fluids to 4 grams today (=32 mEq).  Demetrius Charity, PharmD, [facility] Phone: [phone] 06/07/2019

## 2019-06-07 NOTE — Patient Instructions (Addendum)
[facility] Discharge Instructions for Patients Receiving Chemotherapy  Today you received the following chemotherapy agents ciplatin/irinotecan   To help prevent nausea and vomiting after your treatment, we encourage you to take your nausea medication as directed   TAKE  MACROBID  100 MG  TWICE DAILY  FOR  7 DAYS. PICK UP THIS MEDICATION AT YOUR PHARMACY TODAY AND START TODAY.  If you develop nausea and vomiting that is not controlled by your nausea medication, call the clinic.   BELOW ARE SYMPTOMS THAT SHOULD BE REPORTED IMMEDIATELY:  *FEVER GREATER THAN 100.5 F  *CHILLS WITH OR WITHOUT FEVER  NAUSEA AND VOMITING THAT IS NOT CONTROLLED WITH YOUR NAUSEA MEDICATION  *UNUSUAL SHORTNESS OF BREATH  *UNUSUAL BRUISING OR BLEEDING  TENDERNESS IN MOUTH AND THROAT WITH OR WITHOUT PRESENCE OF ULCERS  *URINARY PROBLEMS  *BOWEL PROBLEMS  UNUSUAL RASH Items with * indicate a potential emergency and should be followed up as soon as possible.  Feel free to call the clinic you have any questions or concerns. The clinic phone number is [phone].

## 2019-06-10 ENCOUNTER — Other Ambulatory Visit: Payer: Self-pay | Admitting: Medical Oncology

## 2019-06-10 ENCOUNTER — Telehealth: Payer: Self-pay | Admitting: Medical Oncology

## 2019-06-10 ENCOUNTER — Other Ambulatory Visit: Payer: Self-pay

## 2019-06-10 ENCOUNTER — Inpatient Hospital Stay: Payer: Medicare Other

## 2019-06-10 VITALS — BP 116/68 | HR 75 | Temp 98.7°F | Resp 18

## 2019-06-10 DIAGNOSIS — E860 Dehydration: Secondary | ICD-10-CM

## 2019-06-10 DIAGNOSIS — R112 Nausea with vomiting, unspecified: Secondary | ICD-10-CM

## 2019-06-10 DIAGNOSIS — Z5111 Encounter for antineoplastic chemotherapy: Secondary | ICD-10-CM | POA: Diagnosis not present

## 2019-06-10 MED ORDER — ONDANSETRON HCL 4 MG/2ML IJ SOLN
INTRAMUSCULAR | Status: AC
  Filled 2019-06-10: qty 4

## 2019-06-10 MED ORDER — SODIUM CHLORIDE 0.9 % IV SOLN
INTRAVENOUS | Status: DC
  Administered 2019-06-10: 13:00:00 via INTRAVENOUS
  Filled 2019-06-10 (×2): qty 250

## 2019-06-10 MED ORDER — SODIUM CHLORIDE 0.9 % IV SOLN: Freq: Once | INTRAVENOUS | Status: DC

## 2019-06-10 MED ORDER — HEPARIN SOD (PORK) LOCK FLUSH 100 UNIT/ML IV SOLN
500.0000 [IU] | Freq: Once | INTRAVENOUS | Status: AC | PRN
  Administered 2019-06-10: 500 [IU]
  Filled 2019-06-10: qty 5

## 2019-06-10 MED ORDER — SODIUM CHLORIDE 0.9% FLUSH
10.0000 mL | INTRAVENOUS | Status: DC | PRN
  Administered 2019-06-10: 10 mL
  Filled 2019-06-10: qty 10

## 2019-06-10 MED ORDER — ONDANSETRON HCL 4 MG/2ML IJ SOLN
8.0000 mg | Freq: Once | INTRAMUSCULAR | Status: AC
  Administered 2019-06-10: 8 mg via INTRAVENOUS

## 2019-06-10 NOTE — Telephone Encounter (Signed)
Vomiting /dry heaves and diarrhea-since Monday evening after chemo. vomiting or having dry heaves 2-3 times /day and changing adult diapers 3 times a day. "Diarrhea pouring out of her". Per Julien Nordmann pt scheduled for IVF today.

## 2019-06-11 LAB — TYPE AND SCREEN
ABO/RH(D): O POS
Antibody Screen: NEGATIVE
Unit division: 0

## 2019-06-11 LAB — BPAM RBC
Blood Product Expiration Date: 202007312359
Unit Type and Rh: 5100

## 2019-06-14 ENCOUNTER — Inpatient Hospital Stay: Payer: Medicare Other

## 2019-06-14 ENCOUNTER — Ambulatory Visit: Payer: Medicare Other

## 2019-06-14 ENCOUNTER — Telehealth: Payer: Self-pay | Admitting: Medical Oncology

## 2019-06-14 ENCOUNTER — Other Ambulatory Visit: Payer: Medicare Other

## 2019-06-14 NOTE — Telephone Encounter (Signed)
Fax received to cancel appts today . I called and left message to return my call.

## 2019-06-14 NOTE — Telephone Encounter (Signed)
Left a second message to return my call.

## 2019-06-15 ENCOUNTER — Other Ambulatory Visit: Payer: Self-pay | Admitting: Internal Medicine

## 2019-06-15 ENCOUNTER — Telehealth: Payer: Self-pay | Admitting: Internal Medicine

## 2019-06-15 DIAGNOSIS — R110 Nausea: Secondary | ICD-10-CM

## 2019-06-15 NOTE — Telephone Encounter (Signed)
Scheduled appt per 7/13 sch message - pt friend [name] aware of appt date and time

## 2019-06-17 ENCOUNTER — Other Ambulatory Visit: Payer: Self-pay | Admitting: Physician Assistant

## 2019-06-17 DIAGNOSIS — R197 Diarrhea, unspecified: Secondary | ICD-10-CM

## 2019-06-18 ENCOUNTER — Other Ambulatory Visit: Payer: Self-pay | Admitting: Physician Assistant

## 2019-06-18 DIAGNOSIS — R197 Diarrhea, unspecified: Secondary | ICD-10-CM

## 2019-06-21 ENCOUNTER — Other Ambulatory Visit: Payer: Self-pay

## 2019-06-21 ENCOUNTER — Ambulatory Visit: Payer: Medicare Other | Admitting: Internal Medicine

## 2019-06-21 ENCOUNTER — Telehealth: Payer: Self-pay | Admitting: Internal Medicine

## 2019-06-21 ENCOUNTER — Inpatient Hospital Stay: Payer: Medicare Other

## 2019-06-21 ENCOUNTER — Inpatient Hospital Stay (HOSPITAL_BASED_OUTPATIENT_CLINIC_OR_DEPARTMENT_OTHER): Payer: Medicare Other | Admitting: Internal Medicine

## 2019-06-21 ENCOUNTER — Ambulatory Visit: Payer: Medicare Other

## 2019-06-21 ENCOUNTER — Other Ambulatory Visit: Payer: Medicare Other

## 2019-06-21 ENCOUNTER — Encounter: Payer: Self-pay | Admitting: Internal Medicine

## 2019-06-21 VITALS — BP 134/85 | HR 88 | Temp 99.1°F | Resp 18 | Ht 64.0 in | Wt 134.0 lb

## 2019-06-21 DIAGNOSIS — D6481 Anemia due to antineoplastic chemotherapy: Secondary | ICD-10-CM | POA: Diagnosis not present

## 2019-06-21 DIAGNOSIS — E039 Hypothyroidism, unspecified: Secondary | ICD-10-CM

## 2019-06-21 DIAGNOSIS — I10 Essential (primary) hypertension: Secondary | ICD-10-CM | POA: Diagnosis not present

## 2019-06-21 DIAGNOSIS — R112 Nausea with vomiting, unspecified: Secondary | ICD-10-CM

## 2019-06-21 DIAGNOSIS — C3432 Malignant neoplasm of lower lobe, left bronchus or lung: Secondary | ICD-10-CM | POA: Diagnosis not present

## 2019-06-21 DIAGNOSIS — Z5111 Encounter for antineoplastic chemotherapy: Secondary | ICD-10-CM | POA: Diagnosis not present

## 2019-06-21 DIAGNOSIS — E860 Dehydration: Secondary | ICD-10-CM

## 2019-06-21 DIAGNOSIS — C771 Secondary and unspecified malignant neoplasm of intrathoracic lymph nodes: Secondary | ICD-10-CM

## 2019-06-21 DIAGNOSIS — C3412 Malignant neoplasm of upper lobe, left bronchus or lung: Secondary | ICD-10-CM

## 2019-06-21 DIAGNOSIS — T451X5A Adverse effect of antineoplastic and immunosuppressive drugs, initial encounter: Secondary | ICD-10-CM

## 2019-06-21 LAB — CBC WITH DIFFERENTIAL (CANCER CENTER ONLY)
Abs Immature Granulocytes: 0.04 10*3/uL (ref 0.00–0.07)
Basophils Absolute: 0 10*3/uL (ref 0.0–0.1)
Basophils Relative: 0 %
Eosinophils Absolute: 0.4 10*3/uL (ref 0.0–0.5)
Eosinophils Relative: 9 %
HCT: 25.4 % — ABNORMAL LOW (ref 36.0–46.0)
Hemoglobin: 8.3 g/dL — ABNORMAL LOW (ref 12.0–15.0)
Immature Granulocytes: 1 %
Lymphocytes Relative: 21 %
Lymphs Abs: 0.9 10*3/uL (ref 0.7–4.0)
MCH: 33.5 pg (ref 26.0–34.0)
MCHC: 32.7 g/dL (ref 30.0–36.0)
MCV: 102.4 fL — ABNORMAL HIGH (ref 80.0–100.0)
Monocytes Absolute: 0.4 10*3/uL (ref 0.1–1.0)
Monocytes Relative: 10 %
Neutro Abs: 2.6 10*3/uL (ref 1.7–7.7)
Neutrophils Relative %: 59 %
Platelet Count: 92 10*3/uL — ABNORMAL LOW (ref 150–400)
RBC: 2.48 MIL/uL — ABNORMAL LOW (ref 3.87–5.11)
RDW: 14.7 % (ref 11.5–15.5)
WBC Count: 4.4 10*3/uL (ref 4.0–10.5)
nRBC: 0 % (ref 0.0–0.2)

## 2019-06-21 LAB — CMP (CANCER CENTER ONLY)
ALT: <6 U/L (ref 0–44)
AST: 10 U/L — ABNORMAL LOW (ref 15–41)
Albumin: 3.1 g/dL — ABNORMAL LOW (ref 3.5–5.0)
Alkaline Phosphatase: 57 U/L (ref 38–126)
Anion gap: 10 (ref 5–15)
BUN: 11 mg/dL (ref 8–23)
CO2: 26 mmol/L (ref 22–32)
Calcium: 7.7 mg/dL — ABNORMAL LOW (ref 8.9–10.3)
Chloride: 106 mmol/L (ref 98–111)
Creatinine: 0.84 mg/dL (ref 0.44–1.00)
GFR, Est AFR Am: >60 mL/min (ref >60)
GFR, Estimated: >60 mL/min (ref >60)
Glucose, Bld: 91 mg/dL (ref 70–99)
Potassium: 3.4 mmol/L — ABNORMAL LOW (ref 3.5–5.1)
Sodium: 142 mmol/L (ref 135–145)
Total Bilirubin: 0.4 mg/dL (ref 0.3–1.2)
Total Protein: 6 g/dL — ABNORMAL LOW (ref 6.5–8.1)

## 2019-06-21 LAB — MAGNESIUM: Magnesium: 1 mg/dL — CL (ref 1.7–2.4)

## 2019-06-21 MED ORDER — HEPARIN SOD (PORK) LOCK FLUSH 100 UNIT/ML IV SOLN
500.0000 [IU] | Freq: Once | INTRAVENOUS | Status: AC | PRN
  Administered 2019-06-21: 500 [IU]
  Filled 2019-06-21: qty 5

## 2019-06-21 MED ORDER — ONDANSETRON HCL 4 MG/2ML IJ SOLN
INTRAMUSCULAR | Status: AC
  Filled 2019-06-21: qty 4

## 2019-06-21 MED ORDER — SODIUM CHLORIDE 0.9% FLUSH
10.0000 mL | INTRAVENOUS | Status: DC | PRN
  Administered 2019-06-21: 10 mL
  Filled 2019-06-21: qty 10

## 2019-06-21 MED ORDER — ONDANSETRON HCL 4 MG/2ML IJ SOLN
8.0000 mg | Freq: Once | INTRAMUSCULAR | Status: AC
  Administered 2019-06-21: 8 mg via INTRAVENOUS

## 2019-06-21 MED ORDER — MAGNESIUM SULFATE 4 GM/100ML IV SOLN
4.0000 g | Freq: Once | INTRAVENOUS | Status: AC
  Administered 2019-06-21: 4 g via INTRAVENOUS
  Filled 2019-06-21: qty 100

## 2019-06-21 MED ORDER — SODIUM CHLORIDE 0.9 % IV SOLN: Freq: Once | INTRAVENOUS | Status: DC

## 2019-06-21 MED ORDER — SODIUM CHLORIDE 0.9 % IV SOLN
Freq: Once | INTRAVENOUS | Status: AC
  Administered 2019-06-21: 10:00:00 via INTRAVENOUS
  Filled 2019-06-21: qty 250

## 2019-06-21 NOTE — Telephone Encounter (Signed)
Scheduled appt per 7/20 los.  Printed calendar and avs.

## 2019-06-21 NOTE — Progress Notes (Signed)
[facility] [phone]   [phone]  OFFICE PROGRESS NOTE  Redmond School, MD [address] [place] [address]  DIAGNOSIS: Metastatic small cell lung cancer initially diagnosed as Limited stage (T1b, N2, M0) small cell lung cancer presented with left lower lobe/infrahilar mass and large mediastinal lymphadenopathy diagnosed in October 2017.  PRIOR THERAPY: 1) Systemic chemotherapy with cisplatin 60 MG/M2 on day 1 and etoposide 120 MG/M2 on days 1, 2 and 3 status post 1 cycle. This was discontinued secondary to intolerance. 2) Systemic chemotherapy with carboplatin for AUC of 4 on day 1 and etoposide 100 MG/M2 on days 1, 2 and 3 with Neulasta support on day 4 every 3 weeks. Status post 5 cycles. This was concurrent with radiation in [place], [place]. 3) prophylactic cranial irradiation. 4) Systemic chemotherapy with carboplatin for AUC of 5 on day 1, etoposide 100 mg/M2 on days 1, 2 and 3 as well as a Tecentriq (Atezolizumab) 1200 mg IV every 3 weeks with Neulasta support.  First dose February 17, 2018.  Status post 6 cycles of partial response. 5) Maintenance treatment with immunotherapy with Tecentriq 1200 mg IV every 3 weeks.  First dose June 30, 2018.  Status post 6 cycles.  Last dose was given October 13, 2018 discontinued secondary to disease progression.   CURRENT THERAPY: Systemic chemotherapy with cisplatin 30 mg/M2 and irinotecan 65 mg/M2 on days 1 and 8 every 3 weeks.  First dose November 10, 2018.  Starting cycle #2 her dose of cisplatin was reduced to 25 mg/M2 and irinotecan 50 mg/M2 on days 1 and 8 every 3 weeks.  Status post 6 cycles.   INTERVAL HISTORY: Rebekah Henderson 63 y.o. female returns to the clinic today for follow-up visit.  The patient lost another 6 pounds since her last visit.  She continues to complain of increasing fatigue and weakness.  She denied having any current chest pain, shortness of breath, cough or  hemoptysis.  She has intermittent nausea and diarrhea.  She denied having any headache or visual changes.  She has a rough time with her current systemic chemotherapy.  The patient is here today for reevaluation and discussion of her treatment options.  MEDICAL HISTORY: Past Medical History:  Diagnosis Date  . 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 Anxiety    takes Prozac daily  . Arthritis   . Benign fundic gland polyps of stomach   . Colon polyps   . COPD (chronic obstructive pulmonary disease) ([place])   . Dehydration 03/06/2017  . Diverticulitis   . Dyspnea    with exertion  . Encounter for antineoplastic chemotherapy 12/03/2016  . Fibromyalgia   . GERD (gastroesophageal reflux disease)    takes Pantoprazole daily  . Hypertension    takes Metoprolol,Triamterene-HCTZ,and Amlodipine daily  . Hypothyroidism    takes Synthroid daily  . IBS (irritable bowel syndrome)   . lung ca dx'd 10/02/2016   skin, lung  . PONV (postoperative nausea and vomiting)    pt also states that she had some difficulty breathing after cervical fusion    ALLERGIES:  is allergic to penicillins; codeine; fentanyl; ranitidine hcl; keflex [cephalexin]; and lyrica [pregabalin].  MEDICATIONS:  Current Outpatient Medications  Medication Sig Dispense Refill  . albuterol (PROVENTIL HFA;VENTOLIN HFA) 108 (90 Base) MCG/ACT inhaler Inhale 2 puffs into the lungs every 4 (four) hours as needed for wheezing or shortness of breath.    . ALPRAZolam (XANAX) 0.25 MG tablet Take 2  tablets (0.5 mg total) by mouth at bedtime as needed for anxiety. 10 tablet 0  . amLODipine (NORVASC) 5 MG tablet Take 5 mg by mouth daily.      . cyclobenzaprine (FLEXERIL) 5 MG tablet TAKE ONE TABLET 3 TIMES A DAY AS NEEDED. (Patient taking differently: Take 5 mg by mouth 3 (three) times daily as needed for muscle spasms. ) 30 tablet 0  . dicyclomine (BENTYL) 20 MG tablet Take 20 mg by mouth 3 (three) times daily as needed for  spasms.    . diphenoxylate-atropine (LOMOTIL) 2.5-0.025 MG tablet TAKE 1 TABLET BY MOUTH 4 TIMES DAILY AS NEEDED FOR LOOSE STOOLS. 30 tablet 0  . estazolam (PROSOM) 2 MG tablet Take 2 mg by mouth at bedtime.    Marland Kitchen estradiol (ESTRACE) 2 MG tablet Take 2 mg by mouth daily.      Marland Kitchen FLUoxetine (PROZAC) 40 MG capsule Take 40 mg by mouth daily.   2  . HYDROcodone-acetaminophen (NORCO/VICODIN) 5-325 MG tablet Take 1 tablet by mouth every 4 (four) hours as needed for moderate pain. 10 tablet 0  . levothyroxine (SYNTHROID, LEVOTHROID) 50 MCG tablet Take 50 mcg by mouth daily before breakfast.     . lidocaine-prilocaine (EMLA) cream Apply 1 application topically as needed. To numb skin over port a cath: Squeeze a  small amount on cotton ball and place over port site 1-2 hours prior to chemotherapy. 30 g 0  . loperamide (IMODIUM) 2 MG capsule Take 2 mg by mouth every 2 (two) hours as needed for diarrhea or loose stools.    . magnesium oxide (MAG-OX) 400 (241.3 Mg) MG tablet Take 1 tablet (400 mg total) by mouth 2 (two) times daily. 60 tablet 1  . metoprolol succinate (TOPROL-XL) 25 MG 24 hr tablet Take 25 mg by mouth daily.    . nitrofurantoin, macrocrystal-monohydrate, (MACROBID) 100 MG capsule Take 1 capsule (100 mg total) by mouth 2 (two) times daily. 14 capsule 0  . ondansetron (ZOFRAN-ODT) 4 MG disintegrating tablet Dissolve one tablet by mouth every 8 hours as needed for nausea and vomiting, 20 tablet 1  . OXYGEN Inhale 2 L into the lungs daily as needed (for shortness of breath).    . pantoprazole (PROTONIX) 40 MG tablet Take 40 mg by mouth 2 (two) times daily before a meal.   10  . phenazopyridine (AZO-TABS) 95 MG tablet Take 190 mg by mouth 2 (two) times a day.    . potassium chloride SA (K-DUR,KLOR-CON) 20 MEQ tablet Take 20 mEq by mouth daily as needed (for supplement).    . promethazine (PHENERGAN) 25 MG tablet TAKE ONE TABLET BY MOUTH EVERY 6 HOURS AS NEEDED. 30 tablet 0  . senna-docusate  (SENOKOT-S) 8.6-50 MG tablet Take 1 tablet by mouth daily as needed for mild constipation or moderate constipation.     . sucralfate (CARAFATE) 1 g tablet Take 1 tablet (1 g total) by mouth 4 (four) times daily. 120 tablet 0   No current facility-administered medications for this visit.    Facility-Administered Medications Ordered in Other Visits  Medication Dose Route Frequency Provider Last Rate Last Dose  . 0.9 %  sodium chloride infusion   Intravenous Continuous Curt Bears, MD   Stopped at 04/27/19 1210  . sodium chloride flush (NS) 0.9 % injection 10 mL  10 mL Intracatheter PRN Curt Bears, MD   10 mL at 03/16/19 1001  . sodium chloride flush (NS) 0.9 % injection 10 mL  10 mL Intracatheter PRN  Curt Bears, MD   10 mL at 04/27/19 1212    SURGICAL HISTORY:  Past Surgical History:  Procedure Laterality Date  . BIOPSY N/A 05/25/2013   Procedure: BIOPSIES (Random Colon; Duodenal; Gastric);  Surgeon: Danie Binder, MD;  Location: [facility];  Service: Endoscopy;  Laterality: N/A;  . BLADDER SUSPENSION    . BREAST ENHANCEMENT SURGERY    . BREAST IMPLANT REMOVAL    . CERVICAL FUSION  AUG 2013  . CHOLECYSTECTOMY  1999  . COLONOSCOPY  2007 [place]   POLYPS  . COLONOSCOPY WITH PROPOFOL N/A 05/25/2013   Procedure: COLONOSCOPY WITH PROPOFOL(at cecum 0957) total withdrawal time=36min);  Surgeon: Danie Binder, MD;  Location: [facility];  Service: Endoscopy;  Laterality: N/A;  . ESOPHAGOGASTRODUODENOSCOPY (EGD) WITH PROPOFOL N/A 05/25/2013   Procedure: ESOPHAGOGASTRODUODENOSCOPY (EGD) WITH PROPOFOL;  Surgeon: Danie Binder, MD;  Location: [facility];  Service: Endoscopy;  Laterality: N/A;  . FOOT SURGERY    . IR FLUORO GUIDE PORT INSERTION RIGHT  03/04/2018  . IR US GUIDE VASC ACCESS RIGHT  03/04/2018  . POLYPECTOMY N/A 05/25/2013   Procedure: POLYPECTOMY (Rectal and Gastric);  Surgeon: Danie Binder, MD;  Location: [facility];  Service: Endoscopy;  Laterality: N/A;  . TONSILLECTOMY    . UPPER  GASTROINTESTINAL ENDOSCOPY    . VIDEO BRONCHOSCOPY Bilateral 05/10/2019   Procedure: VIDEO BRONCHOSCOPY WITHOUT FLUORO;  Surgeon: Chesley Mires, MD;  Location: [facility];  Service: Endoscopy;  Laterality: Bilateral;  . VIDEO BRONCHOSCOPY WITH ENDOBRONCHIAL ULTRASOUND  09/12/2016   Procedure: VIDEO BRONCHOSCOPY WITH ENDOBRONCHIAL ULTRASOUND AND BIOPSY;  Surgeon: Juanito Doom, MD;  Location: [facility];  Service: Cardiopulmonary;;    REVIEW OF SYSTEMS:  A comprehensive review of systems was negative except for: Constitutional: positive for fatigue and weight loss Gastrointestinal: positive for diarrhea and nausea   PHYSICAL EXAMINATION: General appearance: alert, cooperative, fatigued and no distress Head: Normocephalic, without obvious abnormality, atraumatic Neck: no adenopathy, no JVD, supple, symmetrical, trachea midline and thyroid not enlarged, symmetric, no tenderness/mass/nodules Lymph nodes: Cervical, supraclavicular, and axillary nodes normal. Resp: clear to auscultation bilaterally Back: symmetric, no curvature. ROM normal. No CVA tenderness. Cardio: regular rate and rhythm, S1, S2 normal, no murmur, click, rub or gallop GI: soft, non-tender; bowel sounds normal; no masses,  no organomegaly Extremities: extremities normal, atraumatic, no cyanosis or edema   ECOG PERFORMANCE STATUS: 1 - Symptomatic but completely ambulatory  Blood pressure 134/85, pulse 88, temperature 99.1 F (37.3 C), temperature source Oral, resp. rate 18, height 5\' 4"  (1.626 m), weight 134 lb (60.8 kg), SpO2 92 %.  LABORATORY DATA: Lab Results  Component Value Date   WBC 5.4 06/07/2019   HGB 8.7 (L) 06/07/2019   HCT 26.3 (L) 06/07/2019   MCV 101.5 (H) 06/07/2019   PLT 153 06/07/2019      Chemistry      Component Value Date/Time   NA 141 06/07/2019 0758   NA 141 09/08/2017 1009   K 3.5 06/07/2019 0758   K 3.5 09/08/2017 1009   CL 106 06/07/2019 0758   CO2 24 06/07/2019 0758   CO2 29 09/08/2017  1009   BUN 11 06/07/2019 0758   BUN 5.8 (L) 09/08/2017 1009   CREATININE 0.81 06/07/2019 0758   CREATININE 0.7 09/08/2017 1009      Component Value Date/Time   CALCIUM 8.2 (L) 06/07/2019 0758   CALCIUM 9.0 09/08/2017 1009   ALKPHOS 50 06/07/2019 0758   ALKPHOS 49 09/08/2017 1009   AST 10 (  L) 06/07/2019 0758   AST 9 09/08/2017 1009   ALT 6 06/07/2019 0758   ALT <6 09/08/2017 1009   BILITOT 0.3 06/07/2019 0758   BILITOT 0.51 09/08/2017 1009       RADIOGRAPHIC STUDIES: No results found.  ASSESSMENT AND PLAN:  This is a very pleasant 63 years old white female was limited stage small cell lung cancer, status post 6 cycles of systemic chemotherapy with carboplatin and etoposide concurrent with radiation and followed by prophylactic cranial irradiation. Patient has been in observation for more than a year.  She has been doing fine but recently started complaining of increasing fatigue and weakness as well as headache. The patient had evidence for disease recurrence and she was a started on treatment with systemic chemotherapy with carboplatin, etoposide and Tecentriq.  She is status post 6 cycles.  The patient had no evidence for disease progression after the induction phase of her treatment.  She was started on maintenance treatment with Tecentriq Huey Bienenstock) status post 6 cycles.  She has been tolerating this treatment well.   She had evidence for disease progression and the patient was started on systemic chemotherapy with cisplatin and irinotecan on days 1 and 8.  She is tolerating her treatment much better after her dose was reduced to cisplatin 25 mg/M2 and irinotecan 50 mg/M2 on days 1 and 8.  She is status post 7 cycles. She has a rough time with this treatment with significant fatigue and weakness as well as nausea and diarrhea with weight loss. I recommended for the patient to hold her treatment for now and she will continue on observation for the next 2 months with follow-up CT  scan. Depending on the scan results, we will decide the next step in her management including palliative care and hospice versus resuming systemic chemotherapy versus continuous observation. For the electrolyte abnormalities, we will continue to replace her electrolytes as needed. For the chemotherapy-induced anemia, we will consider the patient for transfusion if hemoglobin is less than 8.0G/DL The patient will come back for follow-up visit in 2 months for evaluation with repeat CT scan of the chest, abdomen and pelvis for restaging of her disease. The patient voices understanding of current disease status and treatment options and is in agreement with the current care plan. All questions were answered. The patient knows to call the clinic with any problems, questions or concerns. We can certainly see the patient much sooner if necessary.   Disclaimer: This note was dictated with voice recognition software. Similar sounding words can inadvertently be transcribed and may not be corrected upon review.

## 2019-06-21 NOTE — Patient Instructions (Signed)
Hypomagnesemia Hypomagnesemia is a condition in which the level of magnesium in the blood is low. Magnesium is a mineral that is found in many foods. It is used in many different processes in the body. Hypomagnesemia can affect every organ in the body. In severe cases, it can cause life-threatening problems. What are the causes? This condition may be caused by:  Not getting enough magnesium in your diet.  Malnutrition.  Problems with absorbing magnesium from the intestines.  Dehydration.  Alcohol abuse.  Vomiting.  Severe or chronic diarrhea.  Some medicines, including medicines that make you urinate more (diuretics).  Certain diseases, such as kidney disease, diabetes, celiac disease, and overactive thyroid. What are the signs or symptoms? Symptoms of this condition include:  Loss of appetite.  Nausea and vomiting.  Involuntary shaking or trembling of a body part (tremor).  Muscle weakness.  Tingling in the arms and legs.  Sudden tightening of muscles (muscle spasms).  Confusion.  Psychiatric issues, such as depression, irritability, or psychosis.  A feeling of fluttering of the heart.  Seizures. These symptoms are more severe if magnesium levels drop suddenly. How is this diagnosed? This condition may be diagnosed based on:  Your symptoms and medical history.  A physical exam.  Blood and urine tests. How is this treated? Treatment depends on the cause and the severity of the condition. It may be treated with:  A magnesium supplement. This can be taken in pill form. If the condition is severe, magnesium is usually given through an IV.  Changes to your diet. You may be directed to eat foods that have a lot of magnesium, such as green leafy vegetables, peas, beans, and nuts.  Stopping any intake of alcohol. Follow these instructions at home:      Make sure that your diet includes foods with magnesium. Foods that have a lot of magnesium in them include:  ? Green leafy vegetables, such as spinach and broccoli. ? Beans and peas. ? Nuts and seeds, such as almonds and sunflower seeds. ? Whole grains, such as whole grain bread and fortified cereals.  Take magnesium supplements if your health care provider tells you to do that. Take them as directed.  Take over-the-counter and prescription medicines only as told by your health care provider.  Have your magnesium levels monitored as told by your health care provider.  When you are active, drink fluids that contain electrolytes.  Avoid drinking alcohol.  Keep all follow-up visits as told by your health care provider. This is important. Contact a health care provider if:  You get worse instead of better.  Your symptoms return. Get help right away if you:  Develop severe muscle weakness.  Have trouble breathing.  Feel that your heart is racing. Summary  Hypomagnesemia is a condition in which the level of magnesium in the blood is low.  Hypomagnesemia can affect every organ in the body.  Treatment may include eating more foods that contain magnesium, taking magnesium supplements, and not drinking alcohol.  Have your magnesium levels monitored as told by your health care provider. This information is not intended to replace advice given to you by your health care provider. Make sure you discuss any questions you have with your health care provider. Document Released: 08/14/2005 Document Revised: 10/31/2017 Document Reviewed: 10/20/2017 Elsevier Patient Education  2020 [place] (COVID-19) Are you at risk?  Are you at risk for the Coronavirus (COVID-19)?  To be considered HIGH RISK for Coronavirus (COVID-19), you have  to meet the following criteria:  . Traveled to [place], [place], [place], [place] or [place]; or in the [place] to [place], [place], [place], or [place]; and have fever, cough, and shortness of breath within the last 2 weeks of travel OR  . Been in close contact with a person diagnosed with COVID-19 within the last 2 weeks and have fever, cough, and shortness of breath . IF YOU DO NOT MEET THESE CRITERIA, YOU ARE CONSIDERED LOW RISK FOR COVID-19.  What to do if you are HIGH RISK for COVID-19?  Marland Kitchen If you are having a medical emergency, call 911. . Seek medical care right away. Before you go to a doctor's office, urgent care or emergency department, call ahead and tell them about your recent travel, contact with someone diagnosed with COVID-19, and your symptoms. You should receive instructions from your physician's office regarding next steps of care.  . When you arrive at healthcare provider, tell the healthcare staff immediately you have returned from visiting [place], [place], [place], [place] or [place]; or traveled in the [place] to [place], [place], [place], or [place]; in the last two weeks or you have been in close contact with a person diagnosed with COVID-19 in the last 2 weeks.   . Tell the health care staff about your symptoms: fever, cough and shortness of breath. . After you have been seen by a medical provider, you will be either: o Tested for (COVID-19) and discharged home on quarantine except to seek medical care if symptoms worsen, and asked to  - Stay home and avoid contact with others until you get your results (4-5 days)  - Avoid travel on public transportation if possible (such as bus, train, or airplane) or o Sent to the Emergency Department by EMS for evaluation, COVID-19 testing, and possible admission depending on your condition and test results.  What to do if you are LOW RISK for COVID-19?  Reduce your risk of any infection by using the same precautions used for avoiding the common cold or flu:  Marland Kitchen Wash your hands often with soap and warm water for at least 20 seconds.  If soap and water are not readily available, use an alcohol-based hand sanitizer with at least 60% alcohol.  . If coughing or  sneezing, cover your mouth and nose by coughing or sneezing into the elbow areas of your shirt or coat, into a tissue or into your sleeve (not your hands). . Avoid shaking hands with others and consider head nods or verbal greetings only. . Avoid touching your eyes, nose, or mouth with unwashed hands.  . Avoid close contact with people who are sick. . Avoid places or events with large numbers of people in one location, like concerts or sporting events. . Carefully consider travel plans you have or are making. . If you are planning any travel outside or inside the [place], visit the CDC's Travelers' Health webpage for the latest health notices. . If you have some symptoms but not all symptoms, continue to monitor at home and seek medical attention if your symptoms worsen. . If you are having a medical emergency, call 911.   [place] / e-Visit: [email or web address]         [facility]: [facility]: [phone]                   [facility]:  [phone]   

## 2019-06-28 ENCOUNTER — Ambulatory Visit: Payer: Self-pay | Admitting: Physician Assistant

## 2019-06-28 ENCOUNTER — Other Ambulatory Visit: Payer: No Typology Code available for payment source

## 2019-06-28 ENCOUNTER — Other Ambulatory Visit: Payer: Medicare Other

## 2019-06-28 ENCOUNTER — Ambulatory Visit: Payer: Medicare Other

## 2019-06-28 ENCOUNTER — Ambulatory Visit: Payer: Self-pay

## 2019-06-28 ENCOUNTER — Other Ambulatory Visit: Payer: Self-pay

## 2019-06-29 NOTE — Progress Notes (Signed)
  Radiation Oncology         [phone] ________________________________  Name: Rebekah Henderson MRN: [id number]  Date: 05/24/2019  DOB: 1956/04/22  End of Treatment Note  Diagnosis:   63 yo woman with recurrent left upper lobe small cell lung cancer in a previously irradiated area     Indication for treatment:  Palliation       Radiation treatment dates:   6/9-6/22/2020  Site/dose:   The tumor site in the left lung was treated to 30 Gy in 10 fractions of 3 Gy  Beams/energy:   A static 3-field 3D treatment plan was used with 6 and 10 MV X-rays  Narrative: The patient tolerated radiation treatment relatively well.   She had fatigue and esophagitis treated with carafate.  Plan: The patient has completed radiation treatment. The patient will return to radiation oncology clinic for routine followup in one month. I advised her to call or return sooner if she has any questions or concerns related to her recovery or treatment. ________________________________  Sheral Apley. Tammi Klippel, M.D.

## 2019-06-30 ENCOUNTER — Other Ambulatory Visit: Payer: Self-pay

## 2019-06-30 ENCOUNTER — Ambulatory Visit
Admission: RE | Admit: 2019-06-30 | Discharge: 2019-06-30 | Disposition: A | Discharge status: Home / Self Care | Payer: Medicare Other | Source: Ambulatory Visit | Attending: Urology | Admitting: Urology

## 2019-06-30 DIAGNOSIS — C3492 Malignant neoplasm of unspecified part of left bronchus or lung: Secondary | ICD-10-CM

## 2019-06-30 NOTE — Progress Notes (Signed)
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 Tammi Klippel in the [facility] [facility] clinic.  10/13/16-01/20/17:  Concurrent chemoRT to the lung and mediastinal disease 66 Gy total with Dr. Tammi Klippel in the [place] clinic.   Narrative: I spoke with the patient to conduct her routine scheduled 1 month follow up visit via telephone to spare the patient unnecessary potential exposure in the healthcare setting during the current COVID-19 pandemic.  The patient was notified in advance and gave permission to proceed with this visit format.   In summary, she a history of small cell lung cancer. She initially presented with symptoms of pneumonia and had been treated with two courses of antibiotics without improvment.She underwent a CT scan on 09/02/16 revealing a left hilar mass 2.6 x 2.9 cm (with a 4.3 cm cranio-caudal view in the AP window).This was associated with mass effect upon the left main pulmonary artery and left mainstem bronchus but did not appear to be arising from the bronchus. It was contiguous with the esophagus with no fat plane between the esophagus and the mass. Additionally, there was a mass in the central aspect of the left lower lobe contiguous with the left hilum which could represent a mass or adenopathy measuring 2.3 x 2.0 cm.She underwent bronchoscopy on 09/12/16 with final pathology revealing small cell carcinoma of the left hilum. She was initially diagnosed with limited stage  small cell carcinoma and went on to receive concurrent chemoRT between 10/13/16-01/20/17, and then went on to complete PCI 25 Gy from 04/16/17-04/30/17. She had continued in follow up with Dr. Julien Nordmann and had recently been on systemic therapy with Cisplatin/Irinotecan after she progressed in the fall of 2019 on maintenance immunotherapy with Tecentriq. She had stable to improved findings on her restaging scans on 04/09/2019, however was admitted to the hospital at [facility] on 05/03/2019 due to shortness of breath and confusion. Systemic chemotherapy was placed on hold at the time of admission.  She was diagnosed with a UTI and discharged home on antibiotics.  She presented to [facility] on 05/07/19 due to intractable nausea/vomitting and CT C/A/P showed increase in her left hilar and mediastinal disease with increased mass effect on the left mainstem bronchus, and increased ground glass opacity in the right upper lobe. A CT head on 05/10/19 was negative for acute or malignant findings. Radiation oncology was consulted on 05/11/19 to consider palliative radiotherapy to the left lung due to her mediastinal and hilar disease with recent bronchoscopy on 05/10/19 that confirmed small cell carcinoma with near occlusion and visible endobronchial tumor. She elected to proceed with palliative re-irradiation to the left lung which was completed on 05/24/19 and tolerated well.  She experienced some fatigue and esophagitis near completion of treatment which was managed with carafate.  Her systemic therapy had been on hold for several weeks due to recent hospitalization and need for palliative XRT but she has recently resumed her systemic chemotherapy with  reduced dose cisplatin and irinotecan, for cycle 7 on 06/07/19.                                On review of systems, the patient states that she is doing fair overall.  The esophagitis has resolved and she s eating and drinking normally at this point without difficulty.  She has continued with  significant fatigue as well as N/V associated with her systemic treatments.  At her recent follow up visit with Dr. Julien Nordmann on 06/21/19, the decision was made to hold further systemic treatment and continue in observation for a couple months with repeat imaging prior to follow up.  Depending on those results, she will decide at that time whether she wants to resume systemic treatment or transition to more palliative/comfort care. Currently, she denies chest pain, increased cough, shortness of breath or hemoptysis. She has not had recent fever, chills or night sweats.  Her appetite is poor but she is trying hard to maintain her weight. Unfortunately, her sister-in-law recently passed away and this has been hard on her emotionally.  ALLERGIES:  is allergic to penicillins; codeine; fentanyl; ranitidine hcl; keflex [cephalexin]; and lyrica [pregabalin].  Meds: Current Outpatient Medications  Medication Sig Dispense Refill   albuterol (PROVENTIL HFA;VENTOLIN HFA) 108 (90 Base) MCG/ACT inhaler Inhale 2 puffs into the lungs every 4 (four) hours as needed for wheezing or shortness of breath.     ALPRAZolam (XANAX) 0.25 MG tablet Take 2 tablets (0.5 mg total) by mouth at bedtime as needed for anxiety. 10 tablet 0   amLODipine (NORVASC) 5 MG tablet Take 5 mg by mouth daily.       cyclobenzaprine (FLEXERIL) 5 MG tablet TAKE ONE TABLET 3 TIMES A DAY AS NEEDED. (Patient taking differently: Take 5 mg by mouth 3 (three) times daily as needed for muscle spasms. ) 30 tablet 0   dicyclomine (BENTYL) 20 MG tablet Take 20 mg by mouth 3 (three) times daily as needed for spasms.     diphenoxylate-atropine (LOMOTIL) 2.5-0.025 MG tablet TAKE 1 TABLET BY MOUTH 4 TIMES DAILY AS NEEDED FOR LOOSE STOOLS. 30 tablet 0   estazolam (PROSOM) 2 MG tablet Take 2 mg by mouth at bedtime.     estradiol (ESTRACE) 2 MG tablet Take 2 mg by mouth daily.       FLUoxetine (PROZAC) 40 MG capsule Take 40 mg by mouth daily.   2    HYDROcodone-acetaminophen (NORCO/VICODIN) 5-325 MG tablet Take 1 tablet by mouth every 4 (four) hours as needed for moderate pain. 10 tablet 0   levothyroxine (SYNTHROID, LEVOTHROID) 50 MCG tablet Take 50 mcg by mouth daily before breakfast.      lidocaine-prilocaine (EMLA) cream Apply 1 application topically as needed. To numb skin over port a cath: Squeeze a  small amount on cotton ball and place over port site 1-2 hours prior to chemotherapy. 30 g 0   loperamide (IMODIUM) 2 MG capsule Take 2 mg by mouth every 2 (two) hours as needed for diarrhea or loose stools.     magnesium oxide (MAG-OX) 400 (241.3 Mg) MG tablet Take 1 tablet (400 mg total) by mouth 2 (two) times daily. 60 tablet 1   metoprolol succinate (TOPROL-XL) 25 MG 24 hr tablet Take 25 mg by mouth daily.     nitrofurantoin, macrocrystal-monohydrate, (MACROBID) 100 MG capsule Take 1 capsule (100 mg total) by mouth 2 (two) times daily. 14 capsule  0   ondansetron (ZOFRAN-ODT) 4 MG disintegrating tablet Dissolve one tablet by mouth every 8 hours as needed for nausea and vomiting, 20 tablet 1   OXYGEN Inhale 2 L into the lungs daily as needed (for shortness of breath).     pantoprazole (PROTONIX) 40 MG tablet Take 40 mg by mouth 2 (two) times daily before a meal.   10   phenazopyridine (AZO-TABS) 95 MG tablet Take 190 mg by mouth 2 (two) times a day.     potassium chloride SA (K-DUR,KLOR-CON) 20 MEQ tablet Take 20 mEq by mouth daily as needed (for supplement).     promethazine (PHENERGAN) 25 MG tablet TAKE ONE TABLET BY MOUTH EVERY 6 HOURS AS NEEDED. 30 tablet 0   senna-docusate (SENOKOT-S) 8.6-50 MG tablet Take 1 tablet by mouth daily as needed for mild constipation or moderate constipation.      sucralfate (CARAFATE) 1 g tablet Take 1 tablet (1 g total) by mouth 4 (four) times daily. 120 tablet 0   No current facility-administered medications for this encounter.    Facility-Administered Medications Ordered in Other  Encounters  Medication Dose Route Frequency Provider Last Rate Last Dose   0.9 %  sodium chloride infusion   Intravenous Continuous Curt Bears, MD   Stopped at 04/27/19 1210   sodium chloride flush (NS) 0.9 % injection 10 mL  10 mL Intracatheter PRN Curt Bears, MD   10 mL at 03/16/19 1001   sodium chloride flush (NS) 0.9 % injection 10 mL  10 mL Intracatheter PRN Curt Bears, MD   10 mL at 04/27/19 1212    Physical Findings:  vitals were not taken for this visit.   Karen Kays to assess due to telephone follow up visit format.  Lab Findings: Lab Results  Component Value Date   WBC 4.4 06/21/2019   HGB 8.3 (L) 06/21/2019   HCT 25.4 (L) 06/21/2019   MCV 102.4 (H) 06/21/2019   PLT 92 (L) 06/21/2019     Radiographic Findings: No results found.  Impression/Plan: 78. 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 She appears to be recovering well from the effects of her recent left lung re-irradiation and is currently without complaints.  We discussed that while we are happy to continue to participate in her care if clinically indicated, at this point, we will plan to see her back on an as needed basis.  She will continue in routine follow up with Dr. Julien Nordmann for continued management of her systemic disease with recommendation to continue with serial MRI brain scans every 6 months to monitor for brain metastasis.  She is comfortable with and in agreement with the stated plan and knows to call at any time with any questions or concerns related to her previous radiotherapy.     Nicholos Johns, PA-C

## 2019-06-30 NOTE — Addendum Note (Signed)
Encounter addended by: Freeman Caldron, PA-C on: 06/30/2019 2:34 PM  Actions taken: Level of Service modified

## 2019-07-05 ENCOUNTER — Other Ambulatory Visit: Payer: Self-pay

## 2019-07-05 ENCOUNTER — Ambulatory Visit: Payer: Self-pay

## 2019-07-06 IMAGING — PT NM PET TUM IMG RESTAG (PS) SKULL BASE T - THIGH
1 of 8 series · 1 of 25 positions shown · non-contrast
Comparison: PET-CT 09/27/2016 and recent chest CT 01/30/2018 and
abdominal CT scan 02/06/2018.

CLINICAL DATA: Subsequent treatment strategy for small cell lung
cancer.

EXAM:
NUCLEAR MEDICINE PET SKULL BASE TO THIGH
TECHNIQUE: 9.44 mCi F-18 FDG was injected intravenously. Full-ring PET imaging
was performed from the skull base to thigh after the radiotracer. CT
data was obtained and used for attenuation correction and anatomic
localization.
Fasting blood glucose: 85 mg/dl
Mediastinal blood pool activity: SUV max

[Series 3: pet sk_thigh ac · axial · 5.0mm · 4.07mm/px · 1 of 220 slices shown]
[im 110/220]
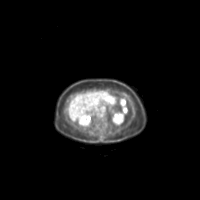

[1 of 25 positions shown; findings below may reference images not displayed]

FINDINGS: NECK: No hypermetabolic lymph nodes in the neck.

Incidental CT findings: none

CHEST: Bulky aorticopulmonary window lymphadenopathy is
hypermetabolic with SUV max of 11.1 consistent with recurrent
disease.

Stable radiation changes involving the left hilum and
paramediastinal long. No hypermetabolism in this area. Subpleural
airspace opacity in the right lower lobe is hypermetabolic with SUV
max of 4.0 but this has the appearance of an infiltrate on the chest
CT. I do not see a discrete pulmonary nodule or mass.

Incidental CT findings: Surgical changes involving the right breast
and scattered macro calcifications and asymmetric breast parenchyma
but no focal mass or hypermetabolism.

ABDOMEN/PELVIS: 4 cm segment 3 liver lesion is hypermetabolic with
SUV max of 10.7 and most consistent with metastatic disease. This
has been recently biopsied. No other liver lesions are identified.

Small hypermetabolic lesion is noted in the spleen. This is
difficult to identified for certain on the CT scan but I believe on
the liver windows it measures 11 mm on image number 100 and the SUV
max is 10.0.

No adrenal gland metastasis are identified. No abdominal/pelvic
lymphadenopathy.

Incidental CT findings: none

SKELETON: There is a metastatic lesion involving the L3 vertebral
body with SUV max of 17.3. No other obvious the bony metastatic
disease.

Incidental CT findings: none
IMPRESSION: 1. Bulky aorticopulmonary window hypermetabolic lymphadenopathy
consistent with local recurrence.
2. Hypermetabolic hepatic, splenic and bone lesions consistent with
metastatic disease as detailed above.

## 2019-07-07 ENCOUNTER — Telehealth: Payer: Self-pay

## 2019-07-07 NOTE — Telephone Encounter (Signed)
Nutrition  Patient identified on Malnutrition Screening report for weight loss and poor appetite.   Chart reviewed.  Called patient, no answer.  Left message with call back number.   Ronnie Mallette B. Zenia Resides, [place], [place] Registered Dietitian [phone] (pager)

## 2019-07-12 ENCOUNTER — Other Ambulatory Visit: Payer: Self-pay | Admitting: Internal Medicine

## 2019-07-12 ENCOUNTER — Ambulatory Visit: Payer: Medicare Other

## 2019-07-12 ENCOUNTER — Ambulatory Visit: Payer: Medicare Other | Admitting: Internal Medicine

## 2019-07-12 ENCOUNTER — Other Ambulatory Visit: Payer: Self-pay | Admitting: Medical

## 2019-07-12 ENCOUNTER — Other Ambulatory Visit: Payer: Medicare Other

## 2019-07-12 ENCOUNTER — Other Ambulatory Visit: Payer: Self-pay | Admitting: Physician Assistant

## 2019-07-12 DIAGNOSIS — R197 Diarrhea, unspecified: Secondary | ICD-10-CM

## 2019-07-12 DIAGNOSIS — R110 Nausea: Secondary | ICD-10-CM

## 2019-07-13 ENCOUNTER — Other Ambulatory Visit: Payer: Self-pay | Admitting: Internal Medicine

## 2019-07-14 ENCOUNTER — Telehealth: Payer: Self-pay | Admitting: Internal Medicine

## 2019-07-14 ENCOUNTER — Telehealth: Payer: Self-pay | Admitting: *Deleted

## 2019-07-14 DIAGNOSIS — C3412 Malignant neoplasm of upper lobe, left bronchus or lung: Secondary | ICD-10-CM

## 2019-07-14 NOTE — Telephone Encounter (Signed)
Called [name] per 8/12 sch message - she is aware of appt date and time

## 2019-07-14 NOTE — Telephone Encounter (Signed)
Received call from pt's caregiver, Wenonah Milo.  She states she is getting concerned about Analys. [name] feels that she is declining-she is becoming confused at times-looking for her mother and her sister who have passed away many years ago.She continues to be very weak, not eating much, sleeping more. She is drinking fluids well. [name] would like her to be seen before the scheduled September appt.  Scheduling message sent for lab/port flush and an appt with Cassie Heilingoetter, PA/ Dr. Julien Nordmann on 07/19/19  [name] voiced understanding and appreciation.

## 2019-07-17 ENCOUNTER — Other Ambulatory Visit: Payer: Self-pay | Admitting: Internal Medicine

## 2019-07-19 ENCOUNTER — Other Ambulatory Visit: Payer: Medicare Other

## 2019-07-19 ENCOUNTER — Other Ambulatory Visit: Payer: No Typology Code available for payment source

## 2019-07-19 ENCOUNTER — Other Ambulatory Visit: Payer: Self-pay

## 2019-07-19 ENCOUNTER — Ambulatory Visit: Payer: Medicare Other | Admitting: Physician Assistant

## 2019-07-19 ENCOUNTER — Ambulatory Visit: Payer: Medicare Other

## 2019-07-19 ENCOUNTER — Ambulatory Visit: Payer: Self-pay | Admitting: Physician Assistant

## 2019-07-19 ENCOUNTER — Ambulatory Visit: Payer: Self-pay

## 2019-07-20 NOTE — Telephone Encounter (Signed)
Please advise refill? 

## 2019-07-21 ENCOUNTER — Telehealth: Payer: Self-pay | Admitting: Internal Medicine

## 2019-07-21 NOTE — Telephone Encounter (Signed)
Called pt per 8/19 sch message - no answer - left message for patient to call back to reschedule.

## 2019-07-21 NOTE — Telephone Encounter (Signed)
Called pt - no answer and call friend - no answer  Left message for patient to call back to r/s and left number for central radiology for scan reschedule

## 2019-07-26 ENCOUNTER — Ambulatory Visit: Payer: Self-pay

## 2019-07-26 ENCOUNTER — Other Ambulatory Visit: Payer: Self-pay

## 2019-08-04 ENCOUNTER — Telehealth: Payer: Self-pay | Admitting: Medical Oncology

## 2019-08-04 NOTE — Telephone Encounter (Signed)
Joaquim Lai notified of appts.

## 2019-08-04 NOTE — Telephone Encounter (Signed)
LVM that pt lab and scan are  Sept 18th.

## 2019-08-17 ENCOUNTER — Telehealth: Payer: Self-pay | Admitting: Medical Oncology

## 2019-08-17 NOTE — Telephone Encounter (Signed)
F/u family concerns -pt not doing well. Spoke to pt and she is difficult to understand - she sounds very lethargic, words slurred.  Daughter -in -law Andi ,is with pt and  concerned with med administration. We did pill count and pill count is correct for  Phenergan/hydrocodone/ Flexeril/xanax despite multiple prescribers.  Falls-Last Sunday pt drove to [place] and fell. Police drove her home. She is confused , vomited yesterday , dizziness and having back pain.   Someone is staying with her ATC. I spoke to [facility] about hospice services and she wants the referral done. Hospice referral made and they will admit  pt on Friday 9/18 after ct scans.  Labs and CT scheduled for Friday . Pt cannot drink the barium prep.

## 2019-08-18 ENCOUNTER — Telehealth: Payer: Self-pay | Admitting: Medical Oncology

## 2019-08-18 NOTE — Telephone Encounter (Signed)
Pt is scheduled to be admitted to hospice on Friday 9/18 at  1430 . Museum/gallery conservator will notify Joaquim Lai. Julien Nordmann has agreed to be attending.

## 2019-08-18 NOTE — Telephone Encounter (Signed)
Call CT results to family member Litzi Binning

## 2019-08-20 ENCOUNTER — Encounter (HOSPITAL_COMMUNITY): Payer: Self-pay

## 2019-08-20 ENCOUNTER — Ambulatory Visit (HOSPITAL_COMMUNITY)
Admission: RE | Admit: 2019-08-20 | Discharge: 2019-08-20 | Disposition: A | Discharge status: Home / Self Care | Payer: Medicare Other | Source: Ambulatory Visit | Attending: Internal Medicine | Admitting: Internal Medicine

## 2019-08-20 ENCOUNTER — Telehealth: Payer: Self-pay | Admitting: *Deleted

## 2019-08-20 ENCOUNTER — Inpatient Hospital Stay: Payer: Medicare Other | Attending: Internal Medicine

## 2019-08-20 ENCOUNTER — Inpatient Hospital Stay: Payer: Medicare Other

## 2019-08-20 ENCOUNTER — Other Ambulatory Visit: Payer: Self-pay

## 2019-08-20 DIAGNOSIS — M545 Low back pain: Secondary | ICD-10-CM | POA: Diagnosis not present

## 2019-08-20 DIAGNOSIS — R197 Diarrhea, unspecified: Secondary | ICD-10-CM | POA: Insufficient documentation

## 2019-08-20 DIAGNOSIS — R269 Unspecified abnormalities of gait and mobility: Secondary | ICD-10-CM | POA: Insufficient documentation

## 2019-08-20 DIAGNOSIS — C3432 Malignant neoplasm of lower lobe, left bronchus or lung: Secondary | ICD-10-CM | POA: Diagnosis not present

## 2019-08-20 DIAGNOSIS — C3412 Malignant neoplasm of upper lobe, left bronchus or lung: Secondary | ICD-10-CM | POA: Diagnosis present

## 2019-08-20 DIAGNOSIS — R5383 Other fatigue: Secondary | ICD-10-CM | POA: Diagnosis not present

## 2019-08-20 DIAGNOSIS — R531 Weakness: Secondary | ICD-10-CM | POA: Diagnosis not present

## 2019-08-20 DIAGNOSIS — R110 Nausea: Secondary | ICD-10-CM | POA: Diagnosis not present

## 2019-08-20 DIAGNOSIS — C778 Secondary and unspecified malignant neoplasm of lymph nodes of multiple regions: Secondary | ICD-10-CM | POA: Insufficient documentation

## 2019-08-20 DIAGNOSIS — C787 Secondary malignant neoplasm of liver and intrahepatic bile duct: Secondary | ICD-10-CM | POA: Insufficient documentation

## 2019-08-20 DIAGNOSIS — C786 Secondary malignant neoplasm of retroperitoneum and peritoneum: Secondary | ICD-10-CM | POA: Diagnosis not present

## 2019-08-20 DIAGNOSIS — R51 Headache: Secondary | ICD-10-CM | POA: Diagnosis not present

## 2019-08-20 DIAGNOSIS — R350 Frequency of micturition: Secondary | ICD-10-CM | POA: Insufficient documentation

## 2019-08-20 DIAGNOSIS — R634 Abnormal weight loss: Secondary | ICD-10-CM | POA: Insufficient documentation

## 2019-08-20 DIAGNOSIS — E039 Hypothyroidism, unspecified: Secondary | ICD-10-CM | POA: Diagnosis not present

## 2019-08-20 DIAGNOSIS — E860 Dehydration: Secondary | ICD-10-CM

## 2019-08-20 DIAGNOSIS — R413 Other amnesia: Secondary | ICD-10-CM | POA: Diagnosis not present

## 2019-08-20 DIAGNOSIS — R112 Nausea with vomiting, unspecified: Secondary | ICD-10-CM

## 2019-08-20 DIAGNOSIS — I10 Essential (primary) hypertension: Secondary | ICD-10-CM | POA: Insufficient documentation

## 2019-08-20 DIAGNOSIS — C7889 Secondary malignant neoplasm of other digestive organs: Secondary | ICD-10-CM | POA: Insufficient documentation

## 2019-08-20 LAB — CBC WITH DIFFERENTIAL (CANCER CENTER ONLY)
Abs Immature Granulocytes: 0.07 10*3/uL (ref 0.00–0.07)
Basophils Absolute: 0 10*3/uL (ref 0.0–0.1)
Basophils Relative: 0 %
Eosinophils Absolute: 0.1 10*3/uL (ref 0.0–0.5)
Eosinophils Relative: 2 %
HCT: 25 % — ABNORMAL LOW (ref 36.0–46.0)
Hemoglobin: 8.3 g/dL — ABNORMAL LOW (ref 12.0–15.0)
Immature Granulocytes: 2 %
Lymphocytes Relative: 13 %
Lymphs Abs: 0.6 10*3/uL — ABNORMAL LOW (ref 0.7–4.0)
MCH: 32.4 pg (ref 26.0–34.0)
MCHC: 33.2 g/dL (ref 30.0–36.0)
MCV: 97.7 fL (ref 80.0–100.0)
Monocytes Absolute: 0.5 10*3/uL (ref 0.1–1.0)
Monocytes Relative: 11 %
Neutro Abs: 3.3 10*3/uL (ref 1.7–7.7)
Neutrophils Relative %: 72 %
Platelet Count: 149 10*3/uL — ABNORMAL LOW (ref 150–400)
RBC: 2.56 MIL/uL — ABNORMAL LOW (ref 3.87–5.11)
RDW: 14.9 % (ref 11.5–15.5)
WBC Count: 4.6 10*3/uL (ref 4.0–10.5)
nRBC: 0 % (ref 0.0–0.2)

## 2019-08-20 LAB — CMP (CANCER CENTER ONLY)
ALT: <6 U/L (ref 0–44)
AST: 21 U/L (ref 15–41)
Albumin: 2.9 g/dL — ABNORMAL LOW (ref 3.5–5.0)
Alkaline Phosphatase: 73 U/L (ref 38–126)
Anion gap: 11 (ref 5–15)
BUN: 11 mg/dL (ref 8–23)
CO2: 26 mmol/L (ref 22–32)
Calcium: 8.6 mg/dL — ABNORMAL LOW (ref 8.9–10.3)
Chloride: 103 mmol/L (ref 98–111)
Creatinine: 0.74 mg/dL (ref 0.44–1.00)
GFR, Est AFR Am: >60 mL/min (ref >60)
GFR, Estimated: >60 mL/min (ref >60)
Glucose, Bld: 87 mg/dL (ref 70–99)
Potassium: 3.7 mmol/L (ref 3.5–5.1)
Sodium: 140 mmol/L (ref 135–145)
Total Bilirubin: 0.6 mg/dL (ref 0.3–1.2)
Total Protein: 6.1 g/dL — ABNORMAL LOW (ref 6.5–8.1)

## 2019-08-20 LAB — MAGNESIUM: Magnesium: 1.1 mg/dL — CL (ref 1.7–2.4)

## 2019-08-20 MED ORDER — SODIUM CHLORIDE (PF) 0.9 % IJ SOLN
INTRAMUSCULAR | Status: AC
  Filled 2019-08-20: qty 50

## 2019-08-20 MED ORDER — SODIUM CHLORIDE 0.9% FLUSH
10.0000 mL | INTRAVENOUS | Status: DC | PRN
  Administered 2019-08-20: 09:00:00 10 mL
  Filled 2019-08-20: qty 10

## 2019-08-20 MED ORDER — IOHEXOL 300 MG/ML  SOLN
100.0000 mL | Freq: Once | INTRAMUSCULAR | Status: AC | PRN
  Administered 2019-08-20: 100 mL via INTRAVENOUS

## 2019-08-20 NOTE — Patient Instructions (Signed)

## 2019-08-20 NOTE — Telephone Encounter (Signed)
TCT pt's caregiver Rebekah Henderson.  Advised her that pt's MG+ level remains low @ 1.1 Advised that pt needs to take her Magnesium supplement 500 mg  4 x a day per Dr. Julien Nordmann. Reviewed symptoms/consequences of low magnesium with Rebekah Henderson. She states pt does not always want to to take her medications. She will continue to encourage pt to do so. Also advised that scan results are not available yet. Dr. Julien Nordmann to discuss these next week.  She voiced understanding. She will call us if she wants in person visit or telephone call next week

## 2019-08-20 NOTE — Telephone Encounter (Signed)
-----   Message from Curt Bears, MD sent at 08/20/2019 10:19 AM EDT ----- Please make sure she takes Magnesium oxide 400 mg po 4 times daily. Thank you. ----- Message ----- From: Buel Ream, Lab In [place] Sent: 08/20/2019   9:30 AM EDT To: Curt Bears, MD

## 2019-08-20 NOTE — Progress Notes (Signed)
PT had CT scan and port was left in by that department she returned to flush and I de-accessed her port for her.

## 2019-08-20 NOTE — Telephone Encounter (Signed)
Received call from Hospice nurse, Tiajuana Amass.  She is with patient for initial Hospice visit. Butch Penny states that pt wants to wait to enroll for Hospice services until she gets the results of her CT scan from today. Informed Butch Penny that pt is scheduled to see Dr. Julien Nordmann on 08/25/19. He will review her scan results with her at that time. Butch Penny states pt just needs to call Hospice when she is ready and they can start services within 24 hours.

## 2019-08-25 ENCOUNTER — Inpatient Hospital Stay (HOSPITAL_BASED_OUTPATIENT_CLINIC_OR_DEPARTMENT_OTHER): Payer: Medicare Other | Admitting: Internal Medicine

## 2019-08-25 ENCOUNTER — Inpatient Hospital Stay: Payer: Medicare Other

## 2019-08-25 ENCOUNTER — Encounter: Payer: Self-pay | Admitting: Internal Medicine

## 2019-08-25 ENCOUNTER — Other Ambulatory Visit: Payer: Self-pay

## 2019-08-25 VITALS — BP 102/64 | HR 84 | Temp 98.3°F | Resp 18 | Ht 64.0 in | Wt 120.6 lb

## 2019-08-25 DIAGNOSIS — N3000 Acute cystitis without hematuria: Secondary | ICD-10-CM

## 2019-08-25 DIAGNOSIS — Z1612 Extended spectrum beta lactamase (ESBL) resistance: Secondary | ICD-10-CM | POA: Diagnosis not present

## 2019-08-25 DIAGNOSIS — N390 Urinary tract infection, site not specified: Secondary | ICD-10-CM | POA: Diagnosis not present

## 2019-08-25 DIAGNOSIS — C3432 Malignant neoplasm of lower lobe, left bronchus or lung: Secondary | ICD-10-CM | POA: Diagnosis not present

## 2019-08-25 DIAGNOSIS — B9629 Other Escherichia coli [E. coli] as the cause of diseases classified elsewhere: Secondary | ICD-10-CM | POA: Diagnosis not present

## 2019-08-25 DIAGNOSIS — C3412 Malignant neoplasm of upper lobe, left bronchus or lung: Secondary | ICD-10-CM

## 2019-08-25 DIAGNOSIS — J449 Chronic obstructive pulmonary disease, unspecified: Secondary | ICD-10-CM | POA: Diagnosis not present

## 2019-08-25 DIAGNOSIS — Z7189 Other specified counseling: Secondary | ICD-10-CM

## 2019-08-25 DIAGNOSIS — E039 Hypothyroidism, unspecified: Secondary | ICD-10-CM

## 2019-08-25 LAB — URINALYSIS, COMPLETE (UACMP) WITH MICROSCOPIC
Bilirubin Urine: NEGATIVE
Glucose, UA: NEGATIVE mg/dL
Hgb urine dipstick: NEGATIVE
Ketones, ur: 5 mg/dL — AB
Leukocytes,Ua: NEGATIVE
Nitrite: NEGATIVE
Protein, ur: NEGATIVE mg/dL
Specific Gravity, Urine: 1.013 (ref 1.005–1.030)
pH: 7 (ref 5.0–8.0)

## 2019-08-25 MED ORDER — FENTANYL 25 MCG/HR TD PT72: 1.0000 | MEDICATED_PATCH | TRANSDERMAL | 0 refills | Status: AC

## 2019-08-25 NOTE — Progress Notes (Signed)
[place] [phone]   [phone]  OFFICE PROGRESS NOTE  [employer], MD [address] [place] [address]  DIAGNOSIS: Metastatic small cell lung cancer initially diagnosed as Limited stage (T1b, N2, M0) small cell lung cancer presented with left lower lobe/infrahilar mass and large mediastinal lymphadenopathy diagnosed in October 2017.  PRIOR THERAPY: 1) Systemic chemotherapy with cisplatin 60 MG/M2 on day 1 and etoposide 120 MG/M2 on days 1, 2 and 3 status post 1 cycle. This was discontinued secondary to intolerance. 2) Systemic chemotherapy with carboplatin for AUC of 4 on day 1 and etoposide 100 MG/M2 on days 1, 2 and 3 with Neulasta support on day 4 every 3 weeks. Status post 5 cycles. This was concurrent with radiation in [place], [place]. 3) prophylactic cranial irradiation. 4) Systemic chemotherapy with carboplatin for AUC of 5 on day 1, etoposide 100 mg/M2 on days 1, 2 and 3 as well as a Tecentriq (Atezolizumab) 1200 mg IV every 3 weeks with Neulasta support.  First dose February 17, 2018.  Status post 6 cycles of partial response. 5) Maintenance treatment with immunotherapy with Tecentriq 1200 mg IV every 3 weeks.  First dose June 30, 2018.  Status post 6 cycles.  Last dose was given October 13, 2018 discontinued secondary to disease progression.   CURRENT THERAPY: Systemic chemotherapy with cisplatin 30 mg/M2 and irinotecan 65 mg/M2 on days 1 and 8 every 3 weeks.  First dose November 10, 2018.  Starting cycle #2 her dose of cisplatin was reduced to 25 mg/M2 and irinotecan 50 mg/M2 on days 1 and 8 every 3 weeks.  Status post 6 cycles.   INTERVAL HISTORY: Rebekah Henderson 63 y.o. female returns to the clinic today for follow-up visit accompanied by 1 of her friend.  The patient continues to have increasing fatigue and weakness as well as garbled speech.  She also has low back pain and currently on hydrocodone with some mild  improvement.  She has trouble with gait as well as memory loss.  She has increased urine frequency recently.  She has been declining significantly in the last 2 months.  She had repeat CT scan of the chest, abdomen and pelvis performed recently and she is here for evaluation and discussion of her scan results and recommendation regarding her condition.  MEDICAL HISTORY: Past Medical History:  Diagnosis Date   Antineoplastic chemotherapy induced anemia 12/03/2016   Anxiety    takes Prozac daily   Arthritis    Benign fundic gland polyps of stomach    Colon polyps    COPD (chronic obstructive pulmonary disease) ([place])    Dehydration 03/06/2017   Diverticulitis    Dyspnea    with exertion   Encounter for antineoplastic chemotherapy 12/03/2016   Fibromyalgia    GERD (gastroesophageal reflux disease)    takes Pantoprazole daily   Hypertension    takes Metoprolol,Triamterene-HCTZ,and Amlodipine daily   Hypothyroidism    takes Synthroid daily   IBS (irritable bowel syndrome)    lung ca dx'd 10/02/2016   skin, lung   PONV (postoperative nausea and vomiting)    pt also states that she had some difficulty breathing after cervical fusion    ALLERGIES:  is allergic to penicillins; codeine; fentanyl; ranitidine hcl; keflex [cephalexin]; and lyrica [pregabalin].  MEDICATIONS:  Current Outpatient Medications  Medication Sig Dispense Refill   albuterol (PROVENTIL HFA;VENTOLIN HFA) 108 (90 Base) MCG/ACT inhaler Inhale 2 puffs into the lungs every 4 (four)  hours as needed for wheezing or shortness of breath.     ALPRAZolam (XANAX) 0.25 MG tablet Take 2 tablets (0.5 mg total) by mouth at bedtime as needed for anxiety. 10 tablet 0   amLODipine (NORVASC) 5 MG tablet Take 5 mg by mouth daily.       cyclobenzaprine (FLEXERIL) 5 MG tablet TAKE ONE TABLET 3 TIMES A DAY AS NEEDED. 30 tablet 0   dicyclomine (BENTYL) 20 MG tablet Take 20 mg by mouth 3 (three) times daily as needed for  spasms.     diphenoxylate-atropine (LOMOTIL) 2.5-0.025 MG tablet TAKE 1 TABLET BY MOUTH 4 TIMES DAILY AS NEEDED FOR LOOSE STOOLS. 30 tablet 0   estazolam (PROSOM) 2 MG tablet Take 2 mg by mouth at bedtime.     estradiol (ESTRACE) 2 MG tablet Take 2 mg by mouth daily.       FLUoxetine (PROZAC) 40 MG capsule Take 40 mg by mouth daily.   2   HYDROcodone-acetaminophen (NORCO/VICODIN) 5-325 MG tablet Take 1 tablet by mouth every 4 (four) hours as needed for moderate pain. 10 tablet 0   levothyroxine (SYNTHROID, LEVOTHROID) 50 MCG tablet Take 50 mcg by mouth daily before breakfast.      lidocaine-prilocaine (EMLA) cream Apply 1 application topically as needed. To numb skin over port a cath: Squeeze a  small amount on cotton ball and place over port site 1-2 hours prior to chemotherapy. 30 g 0   loperamide (IMODIUM) 2 MG capsule Take 2 mg by mouth every 2 (two) hours as needed for diarrhea or loose stools.     magnesium oxide (MAG-OX) 400 (241.3 Mg) MG tablet Take 1 tablet (400 mg total) by mouth 2 (two) times daily. 60 tablet 1   metoprolol succinate (TOPROL-XL) 25 MG 24 hr tablet Take 25 mg by mouth daily.     nitrofurantoin, macrocrystal-monohydrate, (MACROBID) 100 MG capsule TAKE ONE CAPSULE BY MOUTH TWICE DAILY FOR 7 DAYS. 14 capsule 0   ondansetron (ZOFRAN-ODT) 4 MG disintegrating tablet Dissolve one tablet by mouth every 8 hours as needed for nausea and vomiting, 20 tablet 1   OXYGEN Inhale 2 L into the lungs daily as needed (for shortness of breath).     pantoprazole (PROTONIX) 40 MG tablet Take 40 mg by mouth 2 (two) times daily before a meal.   10   phenazopyridine (AZO-TABS) 95 MG tablet Take 190 mg by mouth 2 (two) times a day.     potassium chloride SA (K-DUR,KLOR-CON) 20 MEQ tablet Take 20 mEq by mouth daily as needed (for supplement).     promethazine (PHENERGAN) 25 MG tablet TAKE ONE TABLET BY MOUTH EVERY 6 HOURS AS NEEDED. 30 tablet 0   senna-docusate (SENOKOT-S) 8.6-50  MG tablet Take 1 tablet by mouth daily as needed for mild constipation or moderate constipation.      sucralfate (CARAFATE) 1 g tablet Take 1 tablet (1 g total) by mouth 4 (four) times daily. 120 tablet 0   No current facility-administered medications for this visit.    Facility-Administered Medications Ordered in Other Visits  Medication Dose Route Frequency Provider Last Rate Last Dose   0.9 %  sodium chloride infusion   Intravenous Continuous Curt Bears, MD   Stopped at 04/27/19 1210   sodium chloride flush (NS) 0.9 % injection 10 mL  10 mL Intracatheter PRN Curt Bears, MD   10 mL at 03/16/19 1001   sodium chloride flush (NS) 0.9 % injection 10 mL  10 mL Intracatheter PRN  Curt Bears, MD   10 mL at 04/27/19 1212    SURGICAL HISTORY:  Past Surgical History:  Procedure Laterality Date   BIOPSY N/A 05/25/2013   Procedure: BIOPSIES (Random Colon; Duodenal; Gastric);  Surgeon: Danie Binder, MD;  Location: [facility];  Service: Endoscopy;  Laterality: N/A;   BLADDER SUSPENSION     BREAST ENHANCEMENT SURGERY     BREAST IMPLANT REMOVAL     CERVICAL FUSION  AUG 2013   CHOLECYSTECTOMY  1999   COLONOSCOPY  2007 [place]   POLYPS   COLONOSCOPY WITH PROPOFOL N/A 05/25/2013   Procedure: COLONOSCOPY WITH PROPOFOL(at cecum 0957) total withdrawal time=37min);  Surgeon: Danie Binder, MD;  Location: [facility];  Service: Endoscopy;  Laterality: N/A;   ESOPHAGOGASTRODUODENOSCOPY (EGD) WITH PROPOFOL N/A 05/25/2013   Procedure: ESOPHAGOGASTRODUODENOSCOPY (EGD) WITH PROPOFOL;  Surgeon: Danie Binder, MD;  Location: [facility];  Service: Endoscopy;  Laterality: N/A;   FOOT SURGERY     IR FLUORO GUIDE PORT INSERTION RIGHT  03/04/2018   IR US GUIDE VASC ACCESS RIGHT  03/04/2018   POLYPECTOMY N/A 05/25/2013   Procedure: POLYPECTOMY (Rectal and Gastric);  Surgeon: Danie Binder, MD;  Location: [facility];  Service: Endoscopy;  Laterality: N/A;   TONSILLECTOMY     UPPER GASTROINTESTINAL  ENDOSCOPY     VIDEO BRONCHOSCOPY Bilateral 05/10/2019   Procedure: VIDEO BRONCHOSCOPY WITHOUT FLUORO;  Surgeon: Chesley Mires, MD;  Location: [facility];  Service: Endoscopy;  Laterality: Bilateral;   VIDEO BRONCHOSCOPY WITH ENDOBRONCHIAL ULTRASOUND  09/12/2016   Procedure: VIDEO BRONCHOSCOPY WITH ENDOBRONCHIAL ULTRASOUND AND BIOPSY;  Surgeon: Juanito Doom, MD;  Location: [facility];  Service: Cardiopulmonary;;    REVIEW OF SYSTEMS:  Constitutional: positive for anorexia, fatigue and weight loss Eyes: negative Ears, nose, mouth, throat, and face: negative Respiratory: positive for dyspnea on exertion Cardiovascular: negative Gastrointestinal: positive for diarrhea and melena Genitourinary:positive for frequency Integument/breast: negative Hematologic/lymphatic: negative Musculoskeletal:positive for back pain Neurological: negative Behavioral/Psych: negative Endocrine: negative Allergic/Immunologic: negative   PHYSICAL EXAMINATION: General appearance: alert, cooperative, fatigued and no distress Head: Normocephalic, without obvious abnormality, atraumatic Neck: no adenopathy, no JVD, supple, symmetrical, trachea midline and thyroid not enlarged, symmetric, no tenderness/mass/nodules Lymph nodes: Cervical, supraclavicular, and axillary nodes normal. Resp: clear to auscultation bilaterally Back: symmetric, no curvature. ROM normal. No CVA tenderness. Cardio: regular rate and rhythm, S1, S2 normal, no murmur, click, rub or gallop GI: soft, non-tender; bowel sounds normal; no masses,  no organomegaly Extremities: extremities normal, atraumatic, no cyanosis or edema Neurologic: Alert and oriented X 3, normal strength and tone. Normal symmetric reflexes. Normal coordination and gait   ECOG PERFORMANCE STATUS: 2 - Symptomatic, <50% confined to bed  Blood pressure 102/64, pulse 84, temperature 98.3 F (36.8 C), temperature source Oral, resp. rate 18, height 5\' 4"  (1.626 m), weight 120 lb  9.6 oz (54.7 kg), SpO2 95 %.  LABORATORY DATA: Lab Results  Component Value Date   WBC 4.6 08/20/2019   HGB 8.3 (L) 08/20/2019   HCT 25.0 (L) 08/20/2019   MCV 97.7 08/20/2019   PLT 149 (L) 08/20/2019      Chemistry      Component Value Date/Time   NA 140 08/20/2019 0853   NA 141 09/08/2017 1009   K 3.7 08/20/2019 0853   K 3.5 09/08/2017 1009   CL 103 08/20/2019 0853   CO2 26 08/20/2019 0853   CO2 29 09/08/2017 1009   BUN 11 08/20/2019 0853   BUN 5.8 (L) 09/08/2017 1009  CREATININE 0.74 08/20/2019 0853   CREATININE 0.7 09/08/2017 1009      Component Value Date/Time   CALCIUM 8.6 (L) 08/20/2019 0853   CALCIUM 9.0 09/08/2017 1009   ALKPHOS 73 08/20/2019 0853   ALKPHOS 49 09/08/2017 1009   AST 21 08/20/2019 0853   AST 9 09/08/2017 1009   ALT <6 08/20/2019 0853   ALT <6 09/08/2017 1009   BILITOT 0.6 08/20/2019 0853   BILITOT 0.51 09/08/2017 1009       RADIOGRAPHIC STUDIES: Ct Chest W Contrast  Result Date: 08/20/2019 CLINICAL DATA:  Patient with history of small cell carcinoma of the left lung. Follow-up exam. EXAM: CT CHEST, ABDOMEN, AND PELVIS WITH CONTRAST TECHNIQUE: Multidetector CT imaging of the chest, abdomen and pelvis was performed following the standard protocol during bolus administration of intravenous contrast. CONTRAST:  187mL OMNIPAQUE IOHEXOL 300 MG/ML  SOLN COMPARISON:  CT CAP 05/07/2019 FINDINGS: CT CHEST FINDINGS Cardiovascular: Right anterior chest wall Port-A-Cath is present with tip terminating in the superior vena cava. Normal heart size. Small pericardial effusion. Aorta and main pulmonary artery normal in caliber. Mediastinum/Nodes: Slight interval decrease in size of soft tissue mass within the AP window (image 23; series 2) measuring 2.5 x 2.4 cm, previously 3.1 x 3.0 cm. Mild wall thickening of the midesophagus. Interval development of a 10 mm right axillary node (image 12; series 2). Lungs/Pleura: Central airways are patent. Improved aeration of  the left lung with nearly resolved left pleural effusion. Redemonstrated left paramediastinal post radiation changes. Within the central aspect of the left upper lobe there is a 4.3 x 4.3 cm consolidative mass (image 57; series 4). Tree-in-bud nodular opacities within the lingula (image 91; series 4). Interval development of a 1.3 cm medial right lower lobe nodule (image 60; series 4). Interval development of a 3.3 x 1.8 cm subpleural irregular consolidative opacity right upper lobe (image 52; series 4). Interval development of a 0.8 cm nodule right upper lobe (image 22; series 4). Additional 2-5 mm nodules are demonstrated within the right upper lobe. There is a new 5 mm left lower lobe nodule (image 80; series 4). New 4 mm left upper lobe nodule (image 63; series 4). Musculoskeletal: Thoracic spine degenerative changes. No aggressive or acute appearing osseous lesions. CT ABDOMEN PELVIS FINDINGS Hepatobiliary: Interval development of multiple low-attenuation lesions throughout the liver. Reference lesion within the left hepatic lobe measures 4.5 x 2.2 cm (image 59; series 2). Reference lesion in the right hepatic lobe measures 1.9 x 1.7 cm. Prior cholecystectomy. Prominent common bile duct, likely physiologic from post cholecystectomy state. Pancreas: Interval development of a 1.4 cm low-attenuation lesion within the mid pancreatic body (image 62; series 2). Spleen: Unremarkable Adrenals/Urinary Tract: Normal adrenal glands. Kidneys enhance symmetrically with contrast. Stable bilateral subcentimeter too small to characterize low-attenuation renal lesions. Urinary bladder is unremarkable. Stomach/Bowel: Sigmoid colonic diverticulosis. No CT evidence for acute diverticulitis. Normal morphology of the stomach. No free fluid or free intraperitoneal air. Vascular/Lymphatic: Normal caliber abdominal aorta. Peripheral calcified atherosclerotic plaque. Interval development of a 1.5 cm left periaortic lymph node (image 68;  series 2). Interval development of a 1.3 cm porta hepatic node (image 63; series 2). Reproductive: Prior hysterectomy. Other: None. Musculoskeletal: Similar-appearing 2.1 cm sclerotic lesion within the L3 vertebral body. There is increased soft tissue surrounding the vertebral body (image 69; series 5). IMPRESSION: 1. Interval development of multiple hepatic metastasis, pancreatic metastasis, retroperitoneal nodal metastasis and right axillary nodal metastasis. 2. Improved aeration of the left lung and  decrease in size of previously described left pleural effusion. There is increased patchy consolidation within the medial left upper lobe which may represent combination of post treatment changes/infectious inflammatory process. Alternatively, this may represent disease progression. 3. Interval development of multiple new nodules within the lungs bilaterally which may represent metastatic disease. Some of the more consolidative nodules may potentially represent infectious/inflammatory process. 4. Interval increase in soft tissue disease surrounding the L3 vertebral body with persistent sclerotic lesion. 5. Slight interval decrease in size of soft tissue mass within the AP window. Electronically Signed   By: Lovey Newcomer M.D.   On: 08/20/2019 12:26   Ct Abdomen Pelvis W Contrast  Result Date: 08/20/2019 CLINICAL DATA:  Patient with history of small cell carcinoma of the left lung. Follow-up exam. EXAM: CT CHEST, ABDOMEN, AND PELVIS WITH CONTRAST TECHNIQUE: Multidetector CT imaging of the chest, abdomen and pelvis was performed following the standard protocol during bolus administration of intravenous contrast. CONTRAST:  133mL OMNIPAQUE IOHEXOL 300 MG/ML  SOLN COMPARISON:  CT CAP 05/07/2019 FINDINGS: CT CHEST FINDINGS Cardiovascular: Right anterior chest wall Port-A-Cath is present with tip terminating in the superior vena cava. Normal heart size. Small pericardial effusion. Aorta and main pulmonary artery normal in  caliber. Mediastinum/Nodes: Slight interval decrease in size of soft tissue mass within the AP window (image 23; series 2) measuring 2.5 x 2.4 cm, previously 3.1 x 3.0 cm. Mild wall thickening of the midesophagus. Interval development of a 10 mm right axillary node (image 12; series 2). Lungs/Pleura: Central airways are patent. Improved aeration of the left lung with nearly resolved left pleural effusion. Redemonstrated left paramediastinal post radiation changes. Within the central aspect of the left upper lobe there is a 4.3 x 4.3 cm consolidative mass (image 57; series 4). Tree-in-bud nodular opacities within the lingula (image 91; series 4). Interval development of a 1.3 cm medial right lower lobe nodule (image 60; series 4). Interval development of a 3.3 x 1.8 cm subpleural irregular consolidative opacity right upper lobe (image 52; series 4). Interval development of a 0.8 cm nodule right upper lobe (image 22; series 4). Additional 2-5 mm nodules are demonstrated within the right upper lobe. There is a new 5 mm left lower lobe nodule (image 80; series 4). New 4 mm left upper lobe nodule (image 63; series 4). Musculoskeletal: Thoracic spine degenerative changes. No aggressive or acute appearing osseous lesions. CT ABDOMEN PELVIS FINDINGS Hepatobiliary: Interval development of multiple low-attenuation lesions throughout the liver. Reference lesion within the left hepatic lobe measures 4.5 x 2.2 cm (image 59; series 2). Reference lesion in the right hepatic lobe measures 1.9 x 1.7 cm. Prior cholecystectomy. Prominent common bile duct, likely physiologic from post cholecystectomy state. Pancreas: Interval development of a 1.4 cm low-attenuation lesion within the mid pancreatic body (image 62; series 2). Spleen: Unremarkable Adrenals/Urinary Tract: Normal adrenal glands. Kidneys enhance symmetrically with contrast. Stable bilateral subcentimeter too small to characterize low-attenuation renal lesions. Urinary  bladder is unremarkable. Stomach/Bowel: Sigmoid colonic diverticulosis. No CT evidence for acute diverticulitis. Normal morphology of the stomach. No free fluid or free intraperitoneal air. Vascular/Lymphatic: Normal caliber abdominal aorta. Peripheral calcified atherosclerotic plaque. Interval development of a 1.5 cm left periaortic lymph node (image 68; series 2). Interval development of a 1.3 cm porta hepatic node (image 63; series 2). Reproductive: Prior hysterectomy. Other: None. Musculoskeletal: Similar-appearing 2.1 cm sclerotic lesion within the L3 vertebral body. There is increased soft tissue surrounding the vertebral body (image 69; series 5). IMPRESSION: 1. Interval  development of multiple hepatic metastasis, pancreatic metastasis, retroperitoneal nodal metastasis and right axillary nodal metastasis. 2. Improved aeration of the left lung and decrease in size of previously described left pleural effusion. There is increased patchy consolidation within the medial left upper lobe which may represent combination of post treatment changes/infectious inflammatory process. Alternatively, this may represent disease progression. 3. Interval development of multiple new nodules within the lungs bilaterally which may represent metastatic disease. Some of the more consolidative nodules may potentially represent infectious/inflammatory process. 4. Interval increase in soft tissue disease surrounding the L3 vertebral body with persistent sclerotic lesion. 5. Slight interval decrease in size of soft tissue mass within the AP window. Electronically Signed   By: Lovey Newcomer M.D.   On: 08/20/2019 12:26    ASSESSMENT AND PLAN:  This is a very pleasant 63 years old white female with initial diagnosis of limited stage small cell lung cancer in October 2017, status post 6 cycles of systemic chemotherapy with carboplatin and etoposide concurrent with radiation and followed by prophylactic cranial irradiation. The patient  has been in observation for more than a year.  She has been doing fine but recently started complaining of increasing fatigue and weakness as well as headache. The patient had evidence for disease recurrence and she was a started on treatment with systemic chemotherapy with carboplatin, etoposide and Tecentriq.  She is status post 6 cycles.  The patient had no evidence for disease progression after the induction phase of her treatment.  She was started on maintenance treatment with Tecentriq Huey Bienenstock) status post 6 cycles.  She has been tolerating this treatment well.   She had evidence for disease progression and the patient was started on systemic chemotherapy with cisplatin and irinotecan on days 1 and 8.  She is tolerating her treatment much better after her dose was reduced to cisplatin 25 mg/M2 and irinotecan 50 mg/M2 on days 1 and 8.  She is status post 7 cycles. She has a rough time with this treatment with significant fatigue and weakness as well as nausea and diarrhea with weight loss. The patient has been off treatment for the last few months because of significant adverse effect and intolerability. She had repeat CT scan of the chest, abdomen pelvis performed recently.  I personally and independently reviewed the scans with the patient and her friend. Unfortunately her scan showed significant disease progression in the lung as well as the liver. I strongly recommend for the patient to consider palliative care and hospice at this point.  She has a rough time tolerating her previous chemotherapy and her quality of life will be much worse with any future treatment. The patient agreed to the current plan and she will be followed by the palliative care and hospice of [facility]. For the increased urinary frequency, will check urine analysis to rule out any current urinary tract infection. For the pain management I will start the patient on fentanyl patch 25 mcg/hour every 3 days in addition to  her current treatment with hydrocodone. I will see the patient on as-needed basis at this point. She was advised to call if she has any other concerning symptoms. The patient voices understanding of current disease status and treatment options and is in agreement with the current care plan. All questions were answered. The patient knows to call the clinic with any problems, questions or concerns. We can certainly see the patient much sooner if necessary.   Disclaimer: This note was dictated with voice recognition software. Similar  sounding words can inadvertently be transcribed and may not be corrected upon review.

## 2019-09-01 ENCOUNTER — Telehealth: Payer: Self-pay | Admitting: Medical Oncology

## 2019-09-01 NOTE — Telephone Encounter (Signed)
Pt died 09-13-19 at home .

## 2019-09-02 DEATH — deceased
